# Patient Record
Sex: Female | Born: 1970
Health system: Southern US, Community
[De-identification: ages and names within clinical notes are randomized; demographics above are authoritative.]

## PROBLEM LIST (undated history)

## (undated) ENCOUNTER — Emergency Department (HOSPITAL_COMMUNITY): Admission: EM | Payer: Medicare Other | Source: Home / Self Care

## (undated) DIAGNOSIS — K219 Gastro-esophageal reflux disease without esophagitis: Secondary | ICD-10-CM

## (undated) DIAGNOSIS — K8020 Calculus of gallbladder without cholecystitis without obstruction: Secondary | ICD-10-CM

## (undated) DIAGNOSIS — T7840XA Allergy, unspecified, initial encounter: Secondary | ICD-10-CM

## (undated) DIAGNOSIS — K649 Unspecified hemorrhoids: Secondary | ICD-10-CM

## (undated) DIAGNOSIS — C801 Malignant (primary) neoplasm, unspecified: Secondary | ICD-10-CM

## (undated) DIAGNOSIS — J302 Other seasonal allergic rhinitis: Secondary | ICD-10-CM

## (undated) DIAGNOSIS — D649 Anemia, unspecified: Secondary | ICD-10-CM

## (undated) DIAGNOSIS — E119 Type 2 diabetes mellitus without complications: Secondary | ICD-10-CM

## (undated) DIAGNOSIS — I639 Cerebral infarction, unspecified: Secondary | ICD-10-CM

## (undated) HISTORY — DX: Gastro-esophageal reflux disease without esophagitis: K21.9

## (undated) HISTORY — PX: BREAST BIOPSY: SHX20

## (undated) HISTORY — DX: Unspecified hemorrhoids: K64.9

## (undated) HISTORY — PX: OVARY SURGERY: SHX727

## (undated) HISTORY — PX: MOUTH SURGERY: SHX715

## (undated) HISTORY — DX: Allergy, unspecified, initial encounter: T78.40XA

## (undated) HISTORY — DX: Calculus of gallbladder without cholecystitis without obstruction: K80.20

---

## 2009-09-14 ENCOUNTER — Emergency Department (HOSPITAL_COMMUNITY): Admission: EM | Admit: 2009-09-14 | Discharge: 2009-09-15 | Payer: Self-pay | Admitting: Emergency Medicine

## 2009-11-05 ENCOUNTER — Ambulatory Visit: Payer: Self-pay | Admitting: Family Medicine

## 2009-11-05 ENCOUNTER — Encounter (INDEPENDENT_AMBULATORY_CARE_PROVIDER_SITE_OTHER): Payer: Self-pay | Admitting: Family Medicine

## 2009-11-05 LAB — CONVERTED CEMR LAB
AST: 21 units/L (ref 0–37)
Albumin: 4.7 g/dL (ref 3.5–5.2)
BUN: 21 mg/dL (ref 6–23)
Cholesterol: 162 mg/dL (ref 0–200)
Eosinophils Absolute: 0.1 10*3/uL (ref 0.0–0.7)
Eosinophils Relative: 1 % (ref 0–5)
HDL: 44 mg/dL (ref >39)
Lymphocytes Relative: 30 % (ref 12–46)
Lymphs Abs: 1.6 10*3/uL (ref 0.7–4.0)
Monocytes Absolute: 0.4 10*3/uL (ref 0.1–1.0)
Monocytes Relative: 7 % (ref 3–12)
Platelets: 136 10*3/uL — ABNORMAL LOW (ref 150–400)
RBC: 4.27 M/uL (ref 3.87–5.11)
TSH: 1.076 microintl units/mL (ref 0.350–4.500)
Total CHOL/HDL Ratio: 3.7
Total Protein: 7.7 g/dL (ref 6.0–8.3)
Triglycerides: 89 mg/dL (ref <150)
VLDL: 18 mg/dL (ref 0–40)
Vit D, 25-Hydroxy: 23 ng/mL — ABNORMAL LOW (ref 30–89)
WBC: 5.4 10*3/uL (ref 4.0–10.5)

## 2009-11-28 ENCOUNTER — Ambulatory Visit (HOSPITAL_COMMUNITY): Admission: RE | Admit: 2009-11-28 | Discharge: 2009-11-28 | Payer: Self-pay | Admitting: Family Medicine

## 2009-12-12 ENCOUNTER — Encounter: Admission: RE | Admit: 2009-12-12 | Discharge: 2009-12-12 | Payer: Self-pay | Admitting: Family Medicine

## 2009-12-25 ENCOUNTER — Other Ambulatory Visit: Admission: RE | Admit: 2009-12-25 | Discharge: 2009-12-25 | Payer: Self-pay | Admitting: Family Medicine

## 2009-12-25 ENCOUNTER — Encounter (INDEPENDENT_AMBULATORY_CARE_PROVIDER_SITE_OTHER): Payer: Self-pay | Admitting: Family Medicine

## 2009-12-25 ENCOUNTER — Ambulatory Visit: Payer: Self-pay | Admitting: Internal Medicine

## 2009-12-25 LAB — CONVERTED CEMR LAB: Chlamydia, DNA Probe: NEGATIVE

## 2009-12-29 ENCOUNTER — Ambulatory Visit (HOSPITAL_COMMUNITY): Admission: RE | Admit: 2009-12-29 | Discharge: 2009-12-29 | Payer: Self-pay | Admitting: Family Medicine

## 2010-02-10 ENCOUNTER — Ambulatory Visit: Payer: Self-pay | Admitting: Internal Medicine

## 2010-02-10 ENCOUNTER — Encounter (INDEPENDENT_AMBULATORY_CARE_PROVIDER_SITE_OTHER): Payer: Self-pay | Admitting: Family Medicine

## 2010-02-10 LAB — CONVERTED CEMR LAB
Basophils Absolute: 0 10*3/uL (ref 0.0–0.1)
Eosinophils Absolute: 0.1 10*3/uL (ref 0.0–0.7)
Eosinophils Relative: 1 % (ref 0–5)
Hemoglobin: 12.2 g/dL (ref 12.0–15.0)
Lymphocytes Relative: 28 % (ref 12–46)
MCHC: 31.6 g/dL (ref 30.0–36.0)
MCV: 84.3 fL (ref 78.0–100.0)
Monocytes Absolute: 0.4 10*3/uL (ref 0.1–1.0)
Platelets: 129 10*3/uL — ABNORMAL LOW (ref 150–400)

## 2010-05-18 ENCOUNTER — Ambulatory Visit: Payer: Self-pay | Admitting: Internal Medicine

## 2010-09-28 LAB — URINE MICROSCOPIC-ADD ON

## 2010-09-28 LAB — COMPREHENSIVE METABOLIC PANEL
ALT: 29 U/L (ref 0–35)
Albumin: 4.1 g/dL (ref 3.5–5.2)
Alkaline Phosphatase: 81 U/L (ref 39–117)
BUN: 23 mg/dL (ref 6–23)
CO2: 26 mEq/L (ref 19–32)
Creatinine, Ser: 0.66 mg/dL (ref 0.4–1.2)
GFR calc Af Amer: >60 mL/min (ref >60)
GFR calc non Af Amer: >60 mL/min (ref >60)
Glucose, Bld: 133 mg/dL — ABNORMAL HIGH (ref 70–99)
Potassium: 3.9 mEq/L (ref 3.5–5.1)
Total Protein: 7.6 g/dL (ref 6.0–8.3)

## 2010-09-28 LAB — DIFFERENTIAL
Eosinophils Absolute: 0 10*3/uL (ref 0.0–0.7)
Eosinophils Relative: 0 % (ref 0–5)
Neutrophils Relative %: 96 % — ABNORMAL HIGH (ref 43–77)

## 2010-09-28 LAB — URINALYSIS, ROUTINE W REFLEX MICROSCOPIC
Ketones, ur: 40 mg/dL — AB
Specific Gravity, Urine: 1.031 — ABNORMAL HIGH (ref 1.005–1.030)
Urobilinogen, UA: 0.2 mg/dL (ref 0.0–1.0)

## 2010-09-28 LAB — CBC
RBC: 4.58 MIL/uL (ref 3.87–5.11)
WBC: 10.8 10*3/uL — ABNORMAL HIGH (ref 4.0–10.5)

## 2010-10-29 ENCOUNTER — Emergency Department: Payer: Self-pay | Admitting: Emergency Medicine

## 2015-10-21 ENCOUNTER — Other Ambulatory Visit (HOSPITAL_COMMUNITY): Payer: Self-pay | Admitting: *Deleted

## 2015-10-21 ENCOUNTER — Encounter (HOSPITAL_COMMUNITY): Payer: Self-pay | Admitting: *Deleted

## 2015-10-21 DIAGNOSIS — R928 Other abnormal and inconclusive findings on diagnostic imaging of breast: Secondary | ICD-10-CM

## 2015-11-06 ENCOUNTER — Ambulatory Visit
Admission: RE | Admit: 2015-11-06 | Discharge: 2015-11-06 | Disposition: A | Discharge status: Home / Self Care | Payer: No Typology Code available for payment source | Source: Ambulatory Visit | Attending: Obstetrics and Gynecology | Admitting: Obstetrics and Gynecology

## 2015-11-06 ENCOUNTER — Ambulatory Visit (HOSPITAL_COMMUNITY): Admission: RE | Admit: 2015-11-06 | Payer: Self-pay | Source: Ambulatory Visit

## 2015-11-06 DIAGNOSIS — R928 Other abnormal and inconclusive findings on diagnostic imaging of breast: Secondary | ICD-10-CM

## 2016-02-21 ENCOUNTER — Ambulatory Visit (HOSPITAL_COMMUNITY)
Admission: EM | Admit: 2016-02-21 | Discharge: 2016-02-21 | Disposition: A | Discharge status: Home / Self Care | Payer: No Typology Code available for payment source | Attending: Physician Assistant | Admitting: Physician Assistant

## 2016-02-21 ENCOUNTER — Encounter (HOSPITAL_COMMUNITY): Payer: Self-pay | Admitting: *Deleted

## 2016-02-21 DIAGNOSIS — R600 Localized edema: Secondary | ICD-10-CM

## 2016-02-21 LAB — POCT I-STAT, CHEM 8
BUN: 27 mg/dL — ABNORMAL HIGH (ref 6–20)
CREATININE: 0.6 mg/dL (ref 0.44–1.00)
Calcium, Ion: 1.23 mmol/L (ref 1.13–1.30)
Chloride: 105 mmol/L (ref 101–111)
GLUCOSE: 94 mg/dL (ref 65–99)
HEMATOCRIT: 38 % (ref 36.0–46.0)
HEMOGLOBIN: 12.9 g/dL (ref 12.0–15.0)
Potassium: 3.8 mmol/L (ref 3.5–5.1)
Sodium: 141 mmol/L (ref 135–145)
TCO2: 26 mmol/L (ref 0–100)

## 2016-02-21 NOTE — ED Provider Notes (Signed)
CSN: [id number]     Arrival date & time 02/21/16  1206 History   First MD Initiated Contact with Patient 02/21/16 1303     Chief Complaint  Patient presents with  . Leg Swelling   (Consider location/radiation/quality/duration/timing/severity/associated sxs/prior Treatment) Patient is a 45 yo caucasian female who presents with swelling in both lower extremities that began last night. She has no previous medical history that is noted. She sees Adult Health when has finances to do so.  She is homeless and has been sleeping with her legs propped in another chair. She does not know if this is contributory.  She otherwise has no chest pain, dyspnea or cough. No rashes or fevers.       History reviewed. No pertinent past medical history. History reviewed. No pertinent surgical history. History reviewed. No pertinent family history. Social History  Substance Use Topics  . Smoking status: Never Smoker  . Smokeless tobacco: Never Used  . Alcohol use No   OB History    No data available     Review of Systems  Constitutional: Negative for fever.  Respiratory: Negative for cough, chest tightness and shortness of breath.        No orthopnea   Cardiovascular: Positive for leg swelling. Negative for chest pain.  Gastrointestinal: Negative.   Endocrine: Negative for polydipsia and polyphagia.  Genitourinary: Negative for difficulty urinating.  Skin: Negative for color change, pallor, rash and wound.  Neurological: Negative for dizziness, light-headedness and headaches.  Psychiatric/Behavioral: Negative.     Allergies  Review of patient's allergies indicates no known allergies.  Home Medications   Prior to Admission medications   Not on File   Meds Ordered and Administered this Visit  Medications - No data to display  BP 132/76 (BP Location: Right Arm)   Pulse 78   Temp 97.6 F (36.4 C) (Oral)   LMP 02/13/2016  No data found.   Physical Exam  Constitutional: She is  oriented to person, place, and time. She appears well-developed and well-nourished. No distress.  HENT:  Head: Normocephalic and atraumatic.  Neck: Normal range of motion.  Negative JVD  Cardiovascular: Normal rate, regular rhythm, normal heart sounds and intact distal pulses.  Exam reveals no friction rub.   No murmur heard. Pulmonary/Chest: Effort normal and breath sounds normal. No respiratory distress.  Musculoskeletal: She exhibits no tenderness.  +1 Edema in the mid calves to feet  Neurological: She is alert and oriented to person, place, and time.  Skin: Skin is warm and dry. No rash noted. She is not diaphoretic. No erythema. No pallor.  Psychiatric: She has a normal mood and affect.  Nursing note and vitals reviewed.   Urgent Care Course   Clinical Course    Procedures (including critical care time)  Labs Review Labs Reviewed  POCT I-STAT, CHEM 8 - Abnormal; Notable for the following:       Result Value   BUN 27 (*)    All other components within normal limits    Imaging Review No results found.   Visual Acuity Review  Right Eye Distance:   Left Eye Distance:   Bilateral Distance:    Right Eye Near:   Left Eye Near:    Bilateral Near:         MDM   1. Bilateral edema of lower extremity    No emergent cause is noted today. Patient's vitals, exam and Istat are normal, except for edema. Suggest use of compression hose (she may  get OTC),  Elevation and sodium reduction. She is instructed to follow up with PCP for routine care and further evaluation. Certainly if she worsens or has additional emergent symptoms, then instructed to proceed to ED.     Bjorn Pippin, PA-C 02/21/16 1432

## 2016-02-21 NOTE — ED Triage Notes (Signed)
Pt    Reports   Symptoms    Of     Swelling    Both  Lower  Legs  That  She  Noticed  Last  Pm   denys  Any  Recent  Heavy   strenous   Activity     denys  Any  Chest  Pain  Or   Shortness  Of  Breath  Pt  Is  Sitting upright on the  Exam table   Speaking  In  Complete  sentances

## 2016-02-21 NOTE — Discharge Instructions (Signed)
You have lower extremity edema without signs and symptoms of an emergent issue. As we discussed try compression stockings and elevation. Reduce your salt intake. Your exam and labs look good here which is reassuring. If you begin having worsening symptoms then please go to the ER for further follow up. Please f/u with your PCP for full evaluation. Nice to meet you take care.

## 2016-04-02 ENCOUNTER — Telehealth: Payer: Self-pay | Admitting: *Deleted

## 2016-04-02 NOTE — Telephone Encounter (Signed)
"  This is [employer].  I missed a call from this extension.  Could you tell me what this is in reference to.  I've never been seen there."  Reports she "goes by her middle name [name]".  No call documentation.  New patient coordinator has no referral or records.  "Will you document this so whoever called knows I called back."

## 2016-04-12 ENCOUNTER — Other Ambulatory Visit (HOSPITAL_COMMUNITY): Payer: Self-pay | Admitting: *Deleted

## 2016-04-12 DIAGNOSIS — N6001 Solitary cyst of right breast: Secondary | ICD-10-CM

## 2016-04-13 ENCOUNTER — Other Ambulatory Visit (HOSPITAL_COMMUNITY): Payer: Self-pay | Admitting: *Deleted

## 2016-04-13 DIAGNOSIS — N6001 Solitary cyst of right breast: Secondary | ICD-10-CM

## 2016-04-28 ENCOUNTER — Encounter (HOSPITAL_COMMUNITY): Payer: Self-pay | Admitting: Emergency Medicine

## 2016-04-28 ENCOUNTER — Emergency Department (HOSPITAL_COMMUNITY)
Admission: EM | Admit: 2016-04-28 | Discharge: 2016-04-28 | Disposition: A | Discharge status: Home / Self Care | Payer: Self-pay | Attending: Emergency Medicine | Admitting: Emergency Medicine

## 2016-04-28 ENCOUNTER — Emergency Department (HOSPITAL_COMMUNITY): Payer: Self-pay

## 2016-04-28 DIAGNOSIS — Y999 Unspecified external cause status: Secondary | ICD-10-CM | POA: Insufficient documentation

## 2016-04-28 DIAGNOSIS — Y929 Unspecified place or not applicable: Secondary | ICD-10-CM | POA: Insufficient documentation

## 2016-04-28 DIAGNOSIS — S93402A Sprain of unspecified ligament of left ankle, initial encounter: Secondary | ICD-10-CM | POA: Insufficient documentation

## 2016-04-28 DIAGNOSIS — Y9301 Activity, walking, marching and hiking: Secondary | ICD-10-CM | POA: Insufficient documentation

## 2016-04-28 DIAGNOSIS — W109XXA Fall (on) (from) unspecified stairs and steps, initial encounter: Secondary | ICD-10-CM | POA: Insufficient documentation

## 2016-04-28 HISTORY — DX: Type 2 diabetes mellitus without complications: E11.9

## 2016-04-28 HISTORY — DX: Anemia, unspecified: D64.9

## 2016-04-28 HISTORY — DX: Other seasonal allergic rhinitis: J30.2

## 2016-04-28 NOTE — ED Notes (Signed)
Ortho tech called 

## 2016-04-28 NOTE — ED Triage Notes (Signed)
Pt comes from a class via EMS with complaints of a fall. States she lost her footing.  Reports left ankle injury with swelling present. No note of deformity.  Denies dizziness, LOC, or hitting her head.  States pain 7/10. HR 90, 97% RA, BP 150/100 in route.

## 2016-04-28 NOTE — ED Provider Notes (Signed)
[facility] DEPT Provider Note   CSN: [id number] Arrival date & time: 04/28/16  1556  By signing my name below, I, Rayna Sexton, attest that this documentation has been prepared under the direction and in the presence of [employer], PA-C. Electronically Signed: Rayna Sexton, ED Scribe. 04/28/16. 4:27 PM.   History   Chief Complaint Chief Complaint  Patient presents with  . Fall  . Ankle Pain    HPI HPI Comments: Kaitlyn Duncan is a 45 y.o. female who presents to the Emergency Department by ambulance complaining of a fall that occurred PTA. Pt was walking down a set of stairs and accidentally rolled her left ankle resulting in her fall. She reports associated left ankle pain and swelling which began immediately following the incident. She has not taken anything for her symptoms. Pt has not ambulated since the incident. Pt has a PCP in the area. No other associated symptoms at this time.    The history is provided by the patient. No language interpreter was used.    Past Medical History:  Diagnosis Date  . Anemia   . Anemia   . Diabetes mellitus without complication (HCC)    prediabetic  . Seasonal allergies    There are no active problems to display for this patient.  Past Surgical History:  Procedure Laterality Date  . MOUTH SURGERY    . OVARY SURGERY     removed "something"    OB History    No data available       Home Medications    Prior to Admission medications   Not on File    Family History No family history on file.  Social History Social History  Substance Use Topics  . Smoking status: Never Smoker  . Smokeless tobacco: Never Used  . Alcohol use No     Allergies   Review of patient's allergies indicates no known allergies.   Review of Systems Review of Systems  Musculoskeletal: Positive for arthralgias and joint swelling.  Skin: Negative for color change and wound.  Neurological: Negative for numbness.   Physical  Exam Updated Vital Signs BP (!) 152/102   Pulse 90   Temp 98.2 F (36.8 C) (Oral)   Resp 15   Ht 5\' 1"  (1.549 m)   Wt 72.6 kg   LMP 04/19/2016   SpO2 96%   BMI 30.23 kg/m   Physical Exam  Constitutional: She is oriented to person, place, and time. She appears well-developed and well-nourished.  HENT:  Head: Normocephalic and atraumatic.  Eyes: EOM are normal.  Neck: Normal range of motion.  Cardiovascular: Normal rate.   Pulses:      Dorsalis pedis pulses are 2+ on the left side.       Posterior tibial pulses are 2+ on the left side.  Pulmonary/Chest: Effort normal. No respiratory distress.  Abdominal: Soft.  Musculoskeletal: Normal range of motion. She exhibits edema.  Obvious swelling to the lateral malleolus of the left ankle. No pain with compression of the distal tib/fib or TTP to the proximal fibula.   Neurological: She is alert and oriented to person, place, and time. No sensory deficit.  Sensation intact.   Skin: Skin is warm and dry.  Psychiatric: She has a normal mood and affect.  Nursing note and vitals reviewed.  ED Treatments / Results  Labs (all labs ordered are listed, but only abnormal results are displayed) Labs Reviewed - No data to display  EKG  EKG Interpretation None  Radiology Dg Ankle Complete Left  Result Date: 04/28/2016 CLINICAL DATA:  Slipping injury of the left ankle, pain especially laterally, limited range of motion. EXAM: LEFT ANKLE COMPLETE - 3+ VIEW COMPARISON:  None. FINDINGS: Abnormal soft tissue swelling overlying the lateral malleolus without underlying fracture identified. Plafond and talar dome intact. The base of the fifth metatarsal unremarkable. IMPRESSION: 1. Abnormal soft tissue swelling overlying the lateral malleolus but without underlying bony abnormality identified. Electronically Signed   By: Van Clines M.D.   On: 04/28/2016 16:31   Procedures Procedures  DIAGNOSTIC STUDIES: Oxygen Saturation is 96%  on RA, normal by my interpretation.    COORDINATION OF CARE: 4:27 PM Discussed next steps with pt. Pt verbalized understanding and is agreeable with the plan.    Medications Ordered in ED Medications - No data to display   Initial Impression / Assessment and Plan / ED Course  I have reviewed the triage vital signs and the nursing notes.  Pertinent labs & imaging results that were available during my care of the patient were reviewed by me and considered in my medical decision making (see chart for details).  Clinical Course    Labs: none indicated  Imaging: DG left ankle   Consults: none  Therapeutics:   Assessment:  Plan: Patient presents with ankle sprain, no obvious deformity or significant laxity. Patient was placed in an ASO, given crutches, given care instructions and close follow-up with primary care or orthopedics. Pt given strict return precautions, verbalized understanding and agreement to today's plan and had no further questions or concerns at the time of discharge.    I personally performed the services described in this documentation, which was scribed in my presence. The recorded information has been reviewed and is accurate.  Final Clinical Impressions(s) / ED Diagnoses   Final diagnoses:  Sprain of left ankle, unspecified ligament, initial encounter    New Prescriptions New Prescriptions   No medications on file     Okey Regal, PA-C 04/28/16 1703    Virgel Manifold, MD 05/01/16 [phone]

## 2016-04-28 NOTE — Discharge Instructions (Signed)
Please read attached information. If you experience any new or worsening signs or symptoms please return to the emergency room for evaluation. Please follow-up with your primary care provider or specialist as discussed. Please use medication prescribed only as directed and discontinue taking if you have any concerning signs or symptoms.   °

## 2016-04-29 ENCOUNTER — Ambulatory Visit (HOSPITAL_COMMUNITY)
Admission: RE | Admit: 2016-04-29 | Discharge: 2016-04-29 | Disposition: A | Discharge status: Home / Self Care | Payer: Self-pay | Source: Ambulatory Visit | Attending: Obstetrics and Gynecology | Admitting: Obstetrics and Gynecology

## 2016-04-29 ENCOUNTER — Ambulatory Visit
Admission: RE | Admit: 2016-04-29 | Discharge: 2016-04-29 | Disposition: A | Discharge status: Home / Self Care | Payer: Self-pay | Source: Ambulatory Visit | Attending: Obstetrics and Gynecology | Admitting: Obstetrics and Gynecology

## 2016-04-29 ENCOUNTER — Encounter (HOSPITAL_COMMUNITY): Payer: Self-pay

## 2016-04-29 VITALS — BP 120/84 | Ht 61.0 in | Wt 160.0 lb

## 2016-04-29 DIAGNOSIS — N644 Mastodynia: Secondary | ICD-10-CM

## 2016-04-29 DIAGNOSIS — N6001 Solitary cyst of right breast: Secondary | ICD-10-CM

## 2016-04-29 DIAGNOSIS — Z1239 Encounter for other screening for malignant neoplasm of breast: Secondary | ICD-10-CM

## 2016-04-29 NOTE — Patient Instructions (Signed)
Explained self breast awareness to Merwyn Katos. Patient did not need a Pap smear today due to last Pap smear was in June 2017 per patient. Let her know [facility] will cover Pap smears every 3 years unless has a history of abnormal Pap smears. Referred patient to the [facility] for diagnostic mammogram and possible right breast ultrasound per recommendation. Appointment scheduled for Thursday, April 29, 2016 at 1130. Damion Burgoon verbalized understanding.  Pranavi Aure, Arvil Chaco, RN 12:43 PM

## 2016-04-29 NOTE — Progress Notes (Signed)
Patient referred to [facility] by the [facility] due to six month follow up was recommended for right breast. Right breast diagnostic mammogram completed 12/14/2015. Patient never followed up. Patient stated she had a mammogram completed in [place] around 4 years ago but unsure of the results and recommendations. Informed Mammography Tech of last mammogram. Patient stated she has already requested the results.   Pap Smear: Pap smear not completed today. Last Pap smear was in June 2017 at [facility] and normal per patient. Per patient has no history of an abnormal Pap smear. No Pap smear results are in EPIC.   Physical exam: Breasts Breasts symmetrical. No skin abnormalities bilateral breasts. No nipple retraction bilateral breasts. No nipple discharge bilateral breasts. No lymphadenopathy. No lumps palpated bilateral breasts. Complaints of right breast tenderness around 6 o'clock on exam. Referred patient to the [facility] for diagnostic mammogram and possible right breast ultrasound per recommendation. Appointment scheduled for Thursday, April 29, 2016 at 1130.        Pelvic/Bimanual No Pap smear completed today since last Pap smear was in June 2017  per patient. Pap smear not indicated per [facility] guidelines.   Smoking History: Patient has never smoked.  Patient Navigation: Patient education provided. Access to services provided for patient through [facility] program.

## 2016-05-03 ENCOUNTER — Encounter (HOSPITAL_COMMUNITY): Payer: Self-pay | Admitting: *Deleted

## 2016-05-04 ENCOUNTER — Encounter: Payer: Self-pay | Admitting: Pediatric Intensive Care

## 2016-05-12 ENCOUNTER — Encounter: Payer: Self-pay | Admitting: Pediatric Intensive Care

## 2016-05-12 NOTE — Congregational Nurse Program (Signed)
Congregational Nurse Program Note  Date of Encounter: 05/04/2016  Past Medical History: Past Medical History:  Diagnosis Date  . Anemia   . Anemia   . Diabetes mellitus without complication (HCC)    prediabetic  . Seasonal allergies     Encounter Details:     CNP Questionnaire - 05/04/16 0930      Patient Demographics   Is this a new or existing patient? New   Patient is considered a/an Not Applicable   Race Caucasian/White     Patient Assistance   Location of Patient Assistance [facility]   Patient's financial/insurance status Self-Pay (Uninsured)   Uninsured Patient ([place] [employer]/Care Connects) Yes   Interventions Follow-up/Education/Support provided after completed appt.   Patient referred to apply for the following financial assistance [place] insecurities addressed Not Applicable   Transportation assistance No   Assistance securing medications No   Doctor, hospital the healthcare system     Encounter Details   Primary purpose of visit Acute Illness/Condition Visit;Post ED/Hospitalization Visit   Was an Emergency Department visit averted? Not Applicable   Patient referred to Not Applicable   Was a mental health screening completed? (GAINS tool) No   Does patient have dental issues? No   Does patient have vision issues? No   Does your patient have an abnormal blood pressure today? No   Since previous encounter, have you referred patient for abnormal blood pressure that resulted in a new diagnosis or medication change? No   Does your patient have an abnormal blood glucose today? No   Since previous encounter, have you referred patient for abnormal blood glucose that resulted in a new diagnosis or medication change? No   Was there a life-saving intervention made? No     New client- states that she fell last week injuring her left foot and ankle. Was evalauted in the emergency department. Client states that she was directed to  follow up with orthpedics but she does not have insurance or means to pay for appointment. CN recommended continued elevation for swelling and pain relief. CN will assist client with ortho follow up.

## 2016-08-02 ENCOUNTER — Emergency Department (HOSPITAL_COMMUNITY)
Admission: EM | Admit: 2016-08-02 | Discharge: 2016-08-02 | Disposition: A | Discharge status: Home / Self Care | Payer: No Typology Code available for payment source | Attending: Emergency Medicine | Admitting: Emergency Medicine

## 2016-08-02 ENCOUNTER — Encounter (HOSPITAL_COMMUNITY): Payer: Self-pay | Admitting: Emergency Medicine

## 2016-08-02 DIAGNOSIS — S93402D Sprain of unspecified ligament of left ankle, subsequent encounter: Secondary | ICD-10-CM

## 2016-08-02 DIAGNOSIS — E119 Type 2 diabetes mellitus without complications: Secondary | ICD-10-CM | POA: Insufficient documentation

## 2016-08-02 DIAGNOSIS — M25572 Pain in left ankle and joints of left foot: Secondary | ICD-10-CM

## 2016-08-02 DIAGNOSIS — W19XXXD Unspecified fall, subsequent encounter: Secondary | ICD-10-CM | POA: Insufficient documentation

## 2016-08-02 NOTE — ED Triage Notes (Signed)
Pt arrives via PO[facility] from home with left ankle pain that began in October from fall. Pt reports was told ankle was sprained and has been unable to followup with Ortho because she doesn't have insurance. Pt ambulatory with ortho support device in place. Distal circulation intact. VSS. Pt ambulatory.

## 2016-08-02 NOTE — Discharge Instructions (Signed)
Continue to wear the ankle brace as needed for comfort. Alternate between ice and heat, and elevate ankle throughout the day, using ice pack for no more than 20 minutes every hour.  Alternate between tylenol and motrin for pain relief. Call orthopedic follow up today or tomorrow to schedule followup appointment for recheck of ongoing ankle pain in 1-2 weeks that can be canceled with a 24-48 hour notice if complete resolution of pain. Follow up with [facility] center in 1-2 weeks to establish medical care and to help facilitate getting referred to orthopedist. Return to the ER for changes or worsening symptoms.

## 2016-08-02 NOTE — ED Provider Notes (Signed)
Kaitlyn Duncan Provider Note   CSN: [id number] Arrival date & time: 08/02/16  1217  By signing my name below, I, Kaitlyn Duncan, attest that this documentation has been prepared under the direction and in the presence of [address] [employer] . Electronically Signed: Higinio Duncan, Scribe. 08/02/2016. 2:59 PM.  History   Chief Complaint Chief Complaint  Patient presents with  . Foot Pain   The history is provided by the patient and medical records. No language interpreter was used.  Foot Pain  This is a new problem. The current episode started more than 1 week ago. The problem occurs daily. The problem has been gradually worsening. The symptoms are aggravated by exertion, standing and walking. The symptoms are relieved by acetaminophen. She has tried a warm compress and acetaminophen for the symptoms. The treatment provided moderate relief.   HPI Comments: Kaitlyn Duncan is a 46 y.o. female with PMHx of anemia and DM2, who presents to the Emergency Department complaining of gradually worsening left foot and ankle pain s/p a fall that occurred on 04/28/16. Pt was seen at [facility] ED on 04/28/16 for similar ankle pain after her fall in which she received imaging that revealed swelling around the lateral malleolus but no bony abnormalities present. Pt reports that since then she "randomly" experiences intermittent, 7/10, "sharp" nonradiating pain in various locations of her L foot and ankle; however, she is not experiencing any pain now in the ED. She states associated swelling and bruising to her left ankle that is exacerbated when standing for long periods of time and at night. She notes she has taken ibuprofen and soaked her ankle in epsom salt with moderate relief of swelling and symptoms.  She states she called the orthopedic specialist she was referred to at this appointment but was told "it was just a suggestion" and notes she could not afford an appointment without insurance. Pt reports she also  contact [facility] but was told they were not accepting new patients at this time. She denies any numbness, tingling, or focal weakness, or any other complaints at this time.   Past Medical History:  Diagnosis Date  . Anemia   . Anemia   . Diabetes mellitus without complication (HCC)    prediabetic  . Seasonal allergies    There are no active problems to display for this patient.  Past Surgical History:  Procedure Laterality Date  . MOUTH SURGERY    . OVARY SURGERY     removed "something"    OB History    Gravida Para Term Preterm AB Living   0 0 0 0 0 0   SAB TAB Ectopic Multiple Live Births   0 0 0 0 0     Home Medications    Prior to Admission medications   Not on File    Family History Family History  Problem Relation Age of Onset  . Hypertension Father   . Breast cancer Maternal Aunt   . Diabetes Maternal Aunt   . Breast cancer Paternal Aunt   . Breast cancer Maternal Grandmother   . Breast cancer Paternal Grandmother   . Diabetes Paternal Grandmother     Social History Social History  Substance Use Topics  . Smoking status: Never Smoker  . Smokeless tobacco: Never Used  . Alcohol use No    Allergies   Patient has no known allergies.  Review of Systems Review of Systems  Musculoskeletal: Positive for arthralgias and joint swelling. Negative for myalgias.  Skin:  Positive for color change.  Allergic/Immunologic: Positive for immunocompromised state (DM2).  Neurological: Negative for weakness and numbness.  Psychiatric/Behavioral: Negative for confusion.  10 Systems reviewed and are negative for acute change except as noted in the HPI.   Physical Exam Updated Vital Signs BP 140/98 (BP Location: Right Arm)   Pulse 89   Temp 98.6 F (37 C) (Oral)   Resp 18   SpO2 98%   Physical Exam  Constitutional: She is oriented to person, place, and time. Vital signs are normal. She appears well-developed and well-nourished.  Non-toxic  appearance. No distress.  Afebrile, nontoxic, NAD  HENT:  Head: Normocephalic and atraumatic.  Mouth/Throat: Mucous membranes are normal.  Eyes: Conjunctivae and EOM are normal. Right eye exhibits no discharge. Left eye exhibits no discharge.  Neck: Normal range of motion. Neck supple.  Cardiovascular: Normal rate and intact distal pulses.   Pulmonary/Chest: Effort normal. No respiratory distress.  Abdominal: Normal appearance. She exhibits no distension.  Musculoskeletal: Normal range of motion.       Left ankle: She exhibits swelling. She exhibits normal range of motion, no ecchymosis, no deformity, no laceration and normal pulse. Tenderness. Lateral malleolus tenderness found. Achilles tendon normal.  Left ankle with FROM intact, with mild swelling, no crepitus or deformity, with mild TTP of the lateral malleolus, but no TTP or swelling of fore foot or calf. No break in skin. No bruising or erythema. No warmth. Achilles intact. Good pedal pulse and cap refill of all toes. Wiggling toes without difficulty. Sensation grossly intact. Soft compartments  Neurological: She is alert and oriented to person, place, and time. She has normal strength. No sensory deficit.  Skin: Skin is warm, dry and intact. No rash noted.  Psychiatric: She has a normal mood and affect. Her behavior is normal.  Nursing note and vitals reviewed.  ED Treatments / Results  Labs (all labs ordered are listed, but only abnormal results are displayed) Labs Reviewed - No data to display  EKG  EKG Interpretation None       Radiology No results found.  Study Result from Left Ankle X-ray on 04/28/16 CLINICAL DATA:  Slipping injury of the left ankle, pain especially laterally, limited range of motion.  EXAM: LEFT ANKLE COMPLETE - 3+ VIEW  COMPARISON:  None.  FINDINGS: Abnormal soft tissue swelling overlying the lateral malleolus without underlying fracture identified. Plafond and talar dome intact. The base  of the fifth metatarsal unremarkable.  IMPRESSION: 1. Abnormal soft tissue swelling overlying the lateral malleolus but without underlying bony abnormality identified.   Procedures Procedures (including critical care time)  Medications Ordered in ED Medications - No data to display  DIAGNOSTIC STUDIES:  Oxygen Saturation is 98% on RA, normal by my interpretation.    COORDINATION OF CARE:  2:50 PM Discussed treatment Duncan with pt at bedside and pt agreed to Duncan.  Initial Impression / Assessment and Duncan / ED Course  I have reviewed the triage vital signs and the nursing notes.  Pertinent labs & imaging results that were available during my care of the patient were reviewed by me and considered in my medical decision making (see chart for details).     46 y.o. female here with ongoing L ankle pain after injury in Oct 2017. Xrays at that time neg. Ongoing intermittent pain and swelling, no bruising or erythema/warmth. On exam, NVI with soft compartments, mild tenderness and swelling to lateral malleolus, FROM intact. Likely sprain, given that it hasn't improved, could be  more severe sprain/tear than can be managed with conservative therapies alone, so encouraged pt to f/up with ortho, may need outpatient MRI. Doubt need for other emergent work up at this time. RICE, tylenol/motrin, ice/heat encouraged. F/up with [facility] in 1wk to establish care and for help with referrals through [facility]; f/up with ortho in 1-2wks. I explained the diagnosis and have given explicit precautions to return to the ER including for any other new or worsening symptoms. The patient understands and accepts the medical Duncan as it's been dictated and I have answered their questions. Discharge instructions concerning home care and prescriptions have been given. The patient is STABLE and is discharged to home in good condition.   I personally performed the services described in this documentation, which was scribed  in my presence. The recorded information has been reviewed and is accurate.   Final Clinical Impressions(s) / ED Diagnoses   Final diagnoses:  Sprain of left ankle, unspecified ligament, subsequent encounter  Left ankle pain, unspecified chronicity    New Prescriptions New Prescriptions   No medications on file     [address] PA-C 08/02/16 [place], MD 08/11/16 [phone]

## 2016-08-30 ENCOUNTER — Encounter: Payer: Self-pay | Admitting: Pediatric Intensive Care

## 2016-09-09 NOTE — Congregational Nurse Program (Signed)
Congregational Nurse Program Note  Date of Encounter: 08/30/2016  Past Medical History: Past Medical History:  Diagnosis Date  . Anemia   . Anemia   . Diabetes mellitus without complication (HCC)    prediabetic  . Seasonal allergies     Encounter Details:     CNP Questionnaire - 08/30/16 1045      Patient Demographics   Is this a new or existing patient? New   Patient is considered a/an Not Applicable   Race Caucasian/White     Patient Assistance   Location of Patient Assistance [place]   Patient's financial/insurance status Self-Pay (Uninsured);Low Income   Uninsured Patient ([facility]) Yes   Interventions Not Applicable   Patient referred to apply for the following financial assistance [place] insecurities addressed Not Applicable   Transportation assistance Yes   Type of Assistance Bus Pass Given   Assistance securing medications No   Educational health offerings Navigating the healthcare system     Encounter Details   Primary purpose of visit Education/Health Concerns   Was an Emergency Department visit averted? Not Applicable   Does patient have a medical provider? Yes   Patient referred to Follow up with established PCP   Was a mental health screening completed? (GAINS tool) No   Does patient have dental issues? No   Does patient have vision issues? No   Does your patient have an abnormal blood pressure today? No   Since previous encounter, have you referred patient for abnormal blood pressure that resulted in a new diagnosis or medication change? No   Does your patient have an abnormal blood glucose today? No   Since previous encounter, have you referred patient for abnormal blood glucose that resulted in a new diagnosis or medication change? No   Was there a life-saving intervention made? No    Client is continuing to go to [facility] clinic for PCP. She has filled out [employer] and needs bus passes to tak to  [place].

## 2016-09-22 ENCOUNTER — Ambulatory Visit: Payer: Self-pay | Attending: Orthopedic Surgery | Admitting: Physical Therapy

## 2016-09-22 ENCOUNTER — Encounter: Payer: Self-pay | Admitting: Physical Therapy

## 2016-09-22 DIAGNOSIS — M25672 Stiffness of left ankle, not elsewhere classified: Secondary | ICD-10-CM | POA: Insufficient documentation

## 2016-09-22 DIAGNOSIS — R262 Difficulty in walking, not elsewhere classified: Secondary | ICD-10-CM | POA: Insufficient documentation

## 2016-09-22 DIAGNOSIS — M25572 Pain in left ankle and joints of left foot: Secondary | ICD-10-CM | POA: Insufficient documentation

## 2016-09-22 NOTE — Therapy (Signed)
[place], [place], [address] Phone: [phone]   Fax:  [phone]  Physical Therapy Evaluation  Patient Details  Name: Kaitlyn Duncan MRN: [id number] Date of Birth: 1971/05/04 Referring Provider: Altamese [place], MD  Encounter Date: 09/22/2016      PT End of Session - 09/22/16 1333    Visit Number 1   Number of Visits 9   Date for PT Re-Evaluation 10/22/16   Authorization Type In process of filling out financial aid form   PT Start Time 1333   PT Stop Time 1432   PT Time Calculation (min) 59 min   Activity Tolerance Patient tolerated treatment well   Behavior During Therapy [facility] for tasks assessed/performed      Past Medical History:  Diagnosis Date  . Anemia   . Anemia   . Diabetes mellitus without complication (HCC)    prediabetic  . Seasonal allergies     Past Surgical History:  Procedure Laterality Date  . MOUTH SURGERY    . OVARY SURGERY     removed "something"    There were no vitals filed for this visit.       Subjective Assessment - 09/22/16 1338    Subjective Pt reports falling after missing a step, foot came out from under her and twisted. Noted immediate swelling and pain. still having pain, popping and burning sensation. Pt reports not using crutches over the last week. Pt reports she has been elevating but not icing. Has been wearing slip on sandal over brace.    How long can you stand comfortably? 10 min   Patient Stated Goals walk normally (overusing R), stand for longer periods, steps/stairs, walk/run a 5k   Currently in Pain? Yes   Pain Score 4    Pain Location Ankle   Pain Orientation Left;Lateral   Pain Descriptors / Indicators Aching   Pain Frequency Intermittent   Aggravating Factors  walking, standing   Pain Relieving Factors medications            [facility] PT Assessment - 09/22/16 0001      Assessment   Medical Diagnosis L peroneal tubercle fx,  peroneal tendonitis   Referring Provider Altamese [place], MD   Onset Date/Surgical Date 04/28/17   Hand Dominance Right   Next MD Visit not scheduled   Prior Therapy no     Precautions   Precautions None     Restrictions   Weight Bearing Restrictions No     Balance Screen   Has the patient fallen in the past 6 months Yes   How many times? 1   Has the patient had a decrease in activity level because of a fear of falling?  No   Is the patient reluctant to leave their home because of a fear of falling?  No     Home Ecologist residence   Living Arrangements Other (Comment)  homeless shelter   Additional Comments stairs daily      Prior Function   Vocation Requirements sit at computer     Cognition   Overall Cognitive Status Within Functional Limits for tasks assessed     Observation/Other Assessments   Focus on Therapeutic Outcomes (FOTO)  45% ability (goal 61%)     Sensation   Additional Comments WFL     ROM / Strength   AROM / PROM / Strength AROM;PROM;Strength     AROM   AROM Assessment Site Ankle  Right/Left Ankle Left   Left Ankle Dorsiflexion 0   Left Ankle Plantar Flexion 40   Left Ankle Inversion 40   Left Ankle Eversion 20     PROM   PROM Assessment Site Ankle   Right/Left Ankle Left   Left Ankle Dorsiflexion 4     Strength   Strength Assessment Site Hip;Knee;Ankle   Right/Left Hip Left   Left Hip Flexion 4/5   Left Hip Extension 3+/5   Left Hip ABduction 4-/5   Right/Left Knee Left   Left Knee Flexion 5/5   Left Knee Extension 5/5   Right/Left Ankle Left   Left Ankle Dorsiflexion 4+/5   Left Ankle Inversion 5/5   Left Ankle Eversion 4-/5     Palpation   Palpation comment TTP post lateral malleolus     Ambulation/Gait   Gait Comments antalgic on L, wearing figure 8 ASO                   [facility] Adult PT Treatment/Exercise - 09/22/16 0001      Exercises   Exercises Knee/Hip;Ankle     Ankle  Exercises: Stretches   Other Stretch long sitting DF & hamstring stretch     Ankle Exercises: Seated   Other Seated Ankle Exercises long sitting eversion, inversion                PT Education - 09/22/16 1423    Education provided Yes   Education Details anatomy of condition, POC, HEP, exercise form/rationale   Person(s) Educated Patient   Methods Explanation;Demonstration;Tactile cues;Verbal cues;Handout   Comprehension Verbalized understanding;Returned demonstration;Verbal cues required;Tactile cues required;Need further instruction          PT Short Term Goals - 09/22/16 1428      PT SHORT TERM GOAL #1   Title FOTO to 61% ability to indicate significant improvement in functional activity   Baseline 45% ability at eval   Time 4   Period Weeks   Status New     PT SHORT TERM GOAL #2   Title pt will be able to climb stairs at residence without increase in ankle pain   Baseline discomfort at eval   Time 4   Period Weeks   Status New     PT SHORT TERM GOAL #3   Title Pt will verbalize ability to begin walking program to train for 5K   Baseline will educate as treatment progresses   Time 4   Period Weeks   Status New     PT SHORT TERM GOAL #4   Title DF bilaterally to 8 deg to improve functional ankle mobility   Baseline see flowsheet   Time 4   Period Weeks   Status New     PT SHORT TERM GOAL #5   Title hip strength to 4+/5 to provide proximal stability to LE   Baseline see flowsheet   Time 4   Period Weeks   Status New                  Plan - 09/22/16 1424    Clinical Impression Statement Pt presents to PT today with complaints of L ankle pain and popping following a fall down the stairs. Pt with good strength around ankle but limited ROM and weakness in proximal hip musculature. Pt is no longer on crutches but is wearing ASO; I told her she does not need to wear ASO full time, only with more strenuous walking and other activities since  she  has good strength. Minimal edema noted in L vs R. Pt will benefit from skilled PT in order to improve functional mobility and decrease  ankle pain.    Rehab Potential Good   PT Frequency 2x / week   PT Duration 4 weeks   PT Treatment/Interventions ADLs/Self Care Home Management;Cryotherapy;Electrical Stimulation;Iontophoresis 4mg /ml Dexamethasone;Functional mobility training;Stair training;Gait training;Traction;Moist Heat;Therapeutic activities;Ultrasound;Therapeutic exercise;Balance training;Neuromuscular re-education;Patient/family education;Passive range of motion;Manual techniques;Dry needling;Taping   PT Next Visit Plan DF stretch, CKC strength hips, SLS, steps   PT Home Exercise Plan long sitting DF stretch, red tband resisted eversion & inversion, bridge with clam   Consulted and Agree with Plan of Care Patient      Patient will benefit from skilled therapeutic intervention in order to improve the following deficits and impairments:  Abnormal gait, Decreased range of motion, Difficulty walking, Increased muscle spasms, Pain, Decreased activity tolerance, Impaired flexibility, Decreased strength  Visit Diagnosis: Pain in left ankle and joints of left foot - Plan: PT plan of care cert/re-cert  Stiffness of left ankle, not elsewhere classified - Plan: PT plan of care cert/re-cert  Difficulty in walking, not elsewhere classified - Plan: PT plan of care cert/re-cert     Problem List There are no active problems to display for this patient.  Jaleya Pebley C. Ellar Hakala PT, DPT 09/22/16 2:35 PM   [facility] [address] [place], [place], [address] Phone: [phone]   Fax:  [phone]  Name: Niya Behler MRN: [id number] Date of Birth: 1971/01/17

## 2016-09-27 ENCOUNTER — Ambulatory Visit: Payer: Self-pay | Admitting: Physical Therapy

## 2016-09-27 DIAGNOSIS — M25672 Stiffness of left ankle, not elsewhere classified: Secondary | ICD-10-CM

## 2016-09-27 DIAGNOSIS — M25572 Pain in left ankle and joints of left foot: Secondary | ICD-10-CM

## 2016-09-27 DIAGNOSIS — R262 Difficulty in walking, not elsewhere classified: Secondary | ICD-10-CM

## 2016-09-27 NOTE — Therapy (Signed)
[place], [place], [address] Phone: [phone]   Fax:  [phone]  Physical Therapy Treatment  Patient Details  Name: Kaitlyn Duncan MRN: [id number] Date of Birth: 02-28-1971 Referring Provider: Altamese , MD  Encounter Date: 09/27/2016      PT End of Session - 09/27/16 1258    Visit Number 2   Number of Visits 9   Date for PT Re-Evaluation 10/22/16   Authorization Type In process of filling out financial aid form   PT Start Time 1245   PT Stop Time 1334   PT Time Calculation (min) 49 min      Past Medical History:  Diagnosis Date  . Anemia   . Anemia   . Diabetes mellitus without complication (HCC)    prediabetic  . Seasonal allergies     Past Surgical History:  Procedure Laterality Date  . MOUTH SURGERY    . OVARY SURGERY     removed "something"    There were no vitals filed for this visit.      Subjective Assessment - 09/27/16 1245    Subjective I have not been able to do  the exercises due to my living situation. Stairs are better. I got a compression sock.    Currently in Pain? No/denies                         [facility] Adult PT Treatment/Exercise - 09/27/16 0001      Ambulation/Gait   Ambulation/Gait Yes   Stairs Yes   Stairs Assistance 6: Modified independent (Device/Increase time)   Number of Stairs 12   Height of Stairs 6     Knee/Hip Exercises: Aerobic   Recumbent Bike L1 x 5 minutes      Knee/Hip Exercises: Standing   Lateral Step Up 10 reps;Hand Hold: 1;Step Height: 4"   Forward Step Up 15 reps;Hand Hold: 1;Step Height: 6"   SLS SLS trials and tandem trials, increased anterior ankle pain      Knee/Hip Exercises: Supine   Bridges with Clamshell 10 reps  blue   Straight Leg Raises Both;10 reps     Knee/Hip Exercises: Sidelying   Hip ABduction Both;10 reps     Knee/Hip Exercises: Prone   Straight Leg Raises Both;10 reps     Modalities   Modalities Cryotherapy     Cryotherapy   Number Minutes Cryotherapy 10 Minutes   Cryotherapy Location Ankle   Type of Cryotherapy Ice pack     Ankle Exercises: Stretches   Other Stretch long sitting DF & hamstring stretch     Ankle Exercises: Seated   Other Seated Ankle Exercises long sitting eversion, inversion  red band x 20 each , also DF x 20 red band                   PT Short Term Goals - 09/22/16 1428      PT SHORT TERM GOAL #1   Title FOTO to 61% ability to indicate significant improvement in functional activity   Baseline 45% ability at eval   Time 4   Period Weeks   Status New     PT SHORT TERM GOAL #2   Title pt will be able to climb stairs at residence without increase in ankle pain   Baseline discomfort at eval   Time 4   Period Weeks   Status New     PT SHORT TERM GOAL #  3   Title Pt will verbalize ability to begin walking program to train for 5K   Baseline will educate as treatment progresses   Time 4   Period Weeks   Status New     PT SHORT TERM GOAL #4   Title DF bilaterally to 8 deg to improve functional ankle mobility   Baseline see flowsheet   Time 4   Period Weeks   Status New     PT SHORT TERM GOAL #5   Title hip strength to 4+/5 to provide proximal stability to LE   Baseline see flowsheet   Time 4   Period Weeks   Status New                  Plan - 09/27/16 1309    Clinical Impression Statement Pt has not performed any HEP. She has been attempting steps with correct technique and notes improved mobility. Reviewed HEP today and continued hip and ankle strength. Began recumbent bike with good tolerance. Discussed other positions to perform her HEP as she reports she is unable to complete HEP due to her living situation.    PT Next Visit Plan DF stretch, CKC strength hips, SLS, steps   PT Home Exercise Plan long sitting DF stretch, red tband resisted eversion & inversion, bridge with clam   Consulted and  Agree with Plan of Care Patient      Patient will benefit from skilled therapeutic intervention in order to improve the following deficits and impairments:  Abnormal gait, Decreased range of motion, Difficulty walking, Increased muscle spasms, Pain, Decreased activity tolerance, Impaired flexibility, Decreased strength  Visit Diagnosis: Pain in left ankle and joints of left foot  Stiffness of left ankle, not elsewhere classified  Difficulty in walking, not elsewhere classified     Problem List There are no active problems to display for this patient.   Dorene Ar, [place] 09/27/2016, 1:37 PM  [place], [place], [address] Phone: [phone]   Fax:  [phone]  Name: Kaitlyn Duncan MRN: [id number] Date of Birth: [birth year]

## 2016-09-30 ENCOUNTER — Ambulatory Visit: Payer: Self-pay | Admitting: Physical Therapy

## 2016-09-30 ENCOUNTER — Encounter: Payer: Self-pay | Admitting: Physical Therapy

## 2016-09-30 DIAGNOSIS — R262 Difficulty in walking, not elsewhere classified: Secondary | ICD-10-CM

## 2016-09-30 DIAGNOSIS — M25572 Pain in left ankle and joints of left foot: Secondary | ICD-10-CM

## 2016-09-30 DIAGNOSIS — M25672 Stiffness of left ankle, not elsewhere classified: Secondary | ICD-10-CM

## 2016-09-30 NOTE — Therapy (Signed)
[place], [place], [address] Phone: [phone]   Fax:  [phone]  Physical Therapy Treatment  Patient Details  Name: Kaitlyn Duncan MRN: [id number] Date of Birth: 1971/03/27 Referring Provider: Altamese Ballantine, MD  Encounter Date: 09/30/2016      PT End of Session - 09/30/16 1329    Visit Number 3   Number of Visits 9   Date for PT Re-Evaluation 10/22/16   Authorization Type In process of filling out financial aid form   PT Start Time 1330   PT Stop Time 1424   PT Time Calculation (min) 54 min   Activity Tolerance Patient tolerated treatment well   Behavior During Therapy [facility] for tasks assessed/performed      Past Medical History:  Diagnosis Date  . Anemia   . Anemia   . Diabetes mellitus without complication (HCC)    prediabetic  . Seasonal allergies     Past Surgical History:  Procedure Laterality Date  . MOUTH SURGERY    . OVARY SURGERY     removed "something"    There were no vitals filed for this visit.      Subjective Assessment - 09/30/16 1330    Subjective pt reports having some pain either last night or this morning, sharp sensation over ankle that was constant for a few minutes. Pt reports she is not doing her exercises due to getting in too late.    Patient Stated Goals walk normally (overusing R), stand for longer periods, steps/stairs, walk/run a 5k   Currently in Pain? No/denies                         [facility] Adult PT Treatment/Exercise - 09/30/16 0001      Knee/Hip Exercises: Stretches   Gastroc Stretch 30 seconds   Gastroc Stretch Limitations slant board   Soleus Stretch 30 seconds   Soleus Stretch Limitations slant board     Knee/Hip Exercises: Aerobic   Nustep L4 5 min     Knee/Hip Exercises: Standing   SLS SLS without UE support     Knee/Hip Exercises: Seated   Sit to Sand 10 reps;without UE support  band around knees     Cryotherapy   Number Minutes Cryotherapy 10 Minutes   Cryotherapy Location Ankle   Type of Cryotherapy Ice pack     Manual Therapy   Manual Therapy Edema management;Myofascial release   Edema Management L ankle/foot   Myofascial Release L peroneals, post tib, gastroc.soleus     Ankle Exercises: Seated   [place] Pickup 2 cups                PT Education - 09/30/16 1336    Education provided Yes   Education Details exercise form/rationale   Person(s) Educated Patient   Methods Explanation;Demonstration;Tactile cues;Verbal cues   Comprehension Verbalized understanding;Returned demonstration;Verbal cues required;Tactile cues required;Need further instruction          PT Short Term Goals - 09/22/16 1428      PT SHORT TERM GOAL #1   Title FOTO to 61% ability to indicate significant improvement in functional activity   Baseline 45% ability at eval   Time 4   Period Weeks   Status New     PT SHORT TERM GOAL #2   Title pt will be able to climb stairs at residence without increase in ankle pain   Baseline discomfort at eval   Time 4  Period Weeks   Status New     PT SHORT TERM GOAL #3   Title Pt will verbalize ability to begin walking program to train for 5K   Baseline will educate as treatment progresses   Time 4   Period Weeks   Status New     PT SHORT TERM GOAL #4   Title DF bilaterally to 8 deg to improve functional ankle mobility   Baseline see flowsheet   Time 4   Period Weeks   Status New     PT SHORT TERM GOAL #5   Title hip strength to 4+/5 to provide proximal stability to LE   Baseline see flowsheet   Time 4   Period Weeks   Status New                  Plan - 09/30/16 1415    Clinical Impression Statement notable trigger points in peroneals and gastroc recreated concordant pain. Educated pt on importance of keeping her ankle stretched into a DF ROM.    PT Next Visit Plan myofasical release, DF stretch, CKC hip strength   PT Home  Exercise Plan long sitting DF stretch, red tband resisted eversion & inversion, bridge with clam   Consulted and Agree with Plan of Care Patient      Patient will benefit from skilled therapeutic intervention in order to improve the following deficits and impairments:     Visit Diagnosis: Pain in left ankle and joints of left foot  Stiffness of left ankle, not elsewhere classified  Difficulty in walking, not elsewhere classified     Problem List There are no active problems to display for this patient.   Brave Dack C. Joelys Staubs PT, DPT 09/30/16 2:17 PM   [place] [facility] [address] [place], [place], [address] Phone: [phone]   Fax:  [phone]  Name: Geonna Lockyer MRN: [id number] Date of Birth: 08/12/1970

## 2016-10-04 ENCOUNTER — Encounter: Payer: Self-pay | Admitting: Physical Therapy

## 2016-10-04 ENCOUNTER — Ambulatory Visit: Payer: Self-pay | Attending: Orthopedic Surgery | Admitting: Physical Therapy

## 2016-10-04 DIAGNOSIS — R262 Difficulty in walking, not elsewhere classified: Secondary | ICD-10-CM

## 2016-10-04 DIAGNOSIS — M25672 Stiffness of left ankle, not elsewhere classified: Secondary | ICD-10-CM

## 2016-10-04 DIAGNOSIS — M25572 Pain in left ankle and joints of left foot: Secondary | ICD-10-CM

## 2016-10-04 NOTE — Therapy (Signed)
[place], [place], [address] Phone: [phone]   Fax:  [phone]  Physical Therapy Treatment  Patient Details  Name: Kaitlyn Duncan MRN: [id number] Date of Birth: May 05, 1971 Referring Provider: Altamese [place], MD  Encounter Date: 10/04/2016      PT End of Session - 10/04/16 1418    Visit Number 4   Number of Visits 9   Date for PT Re-Evaluation 10/22/16   Authorization Type In process of filling out financial aid form   PT Start Time 1417   PT Stop Time 1508   PT Time Calculation (min) 51 min   Activity Tolerance Patient tolerated treatment well   Behavior During Therapy [facility] for tasks assessed/performed      Past Medical History:  Diagnosis Date  . Anemia   . Anemia   . Diabetes mellitus without complication (HCC)    prediabetic  . Seasonal allergies     Past Surgical History:  Procedure Laterality Date  . MOUTH SURGERY    . OVARY SURGERY     removed "something"    There were no vitals filed for this visit.      Subjective Assessment - 10/04/16 1419    Subjective Pt is wearing ASO today due to increased walking today. Denies pain. Reports overall that she feels like she is doing better and walking more normally.                          [facility] Adult PT Treatment/Exercise - 10/04/16 0001      Knee/Hip Exercises: Aerobic   Nustep L5 5 min     Knee/Hip Exercises: Machines for Strengthening   Cybex Leg Press 20#, horiz leg press x30     Knee/Hip Exercises: Standing   Rocker Board Limitations tap & balance, fwd/back & lat     Knee/Hip Exercises: Seated   Sit to Sand 10 reps;without UE support  onto airex     Cryotherapy   Number Minutes Cryotherapy 10 Minutes   Cryotherapy Location Ankle   Type of Cryotherapy Ice pack     Ankle Exercises: Seated   Other Seated Ankle Exercises 4-way red tband      Ankle Exercises: Stretches   Gastroc Stretch 2 reps;30  seconds                PT Education - 10/04/16 1455    Education provided Yes   Education Details exercise form/rationale, weening from brace, pain with new challenges   Person(s) Educated Patient   Methods Explanation;Demonstration;Tactile cues;Verbal cues   Comprehension Verbalized understanding;Returned demonstration;Verbal cues required;Tactile cues required;Need further instruction          PT Short Term Goals - 09/22/16 1428      PT SHORT TERM GOAL #1   Title FOTO to 61% ability to indicate significant improvement in functional activity   Baseline 45% ability at eval   Time 4   Period Weeks   Status New     PT SHORT TERM GOAL #2   Title pt will be able to climb stairs at residence without increase in ankle pain   Baseline discomfort at eval   Time 4   Period Weeks   Status New     PT SHORT TERM GOAL #3   Title Pt will verbalize ability to begin walking program to train for 5K   Baseline will educate as treatment progresses   Time 4  Period Weeks   Status New     PT SHORT TERM GOAL #4   Title DF bilaterally to 8 deg to improve functional ankle mobility   Baseline see flowsheet   Time 4   Period Weeks   Status New     PT SHORT TERM GOAL #5   Title hip strength to 4+/5 to provide proximal stability to LE   Baseline see flowsheet   Time 4   Period Weeks   Status New                  Plan - 10/04/16 1459    Clinical Impression Statement Pt reported mild lateral ankle pain upon standing from chair onto airex. Discussed decreasing dependence on ASO and how new activities will challenge ankle. Demo improved control of ankle today but will cont to benefit from functional balance and unstable surfaces challenges.    PT Next Visit Plan myofasical release PRN, DF stretch, CKC hip strength   PT Home Exercise Plan long sitting DF stretch, red tband resisted eversion & inversion, bridge with clam   Consulted and Agree with Plan of Care Patient       Patient will benefit from skilled therapeutic intervention in order to improve the following deficits and impairments:     Visit Diagnosis: Pain in left ankle and joints of left foot  Stiffness of left ankle, not elsewhere classified  Difficulty in walking, not elsewhere classified     Problem List There are no active problems to display for this patient.   Waylon Hershey C. Malak Duchesneau PT, DPT 10/04/16 3:08 PM   [place] [facility] [address] [place], [place], [address] Phone: [phone]   Fax:  [phone]  Name: Kaitlyn Duncan MRN: [id number] Date of Birth: 08-10-1970

## 2016-10-06 ENCOUNTER — Ambulatory Visit: Payer: Self-pay | Admitting: Physical Therapy

## 2016-10-06 ENCOUNTER — Encounter: Payer: Self-pay | Admitting: Physical Therapy

## 2016-10-06 DIAGNOSIS — M25572 Pain in left ankle and joints of left foot: Secondary | ICD-10-CM

## 2016-10-06 DIAGNOSIS — M25672 Stiffness of left ankle, not elsewhere classified: Secondary | ICD-10-CM

## 2016-10-06 DIAGNOSIS — R262 Difficulty in walking, not elsewhere classified: Secondary | ICD-10-CM

## 2016-10-06 NOTE — Therapy (Signed)
[place], [place], [address] Phone: [phone]   Fax:  [phone]  Physical Therapy Treatment  Patient Details  Name: Kaitlyn Duncan MRN: [id number] Date of Birth: 21-Nov-1970 Referring Provider: Altamese [place], MD  Encounter Date: 10/06/2016      PT End of Session - 10/06/16 1342    Visit Number 5   Number of Visits 9   Date for PT Re-Evaluation 10/22/16   Authorization Type In process of filling out financial aid form   PT Start Time 1345   PT Stop Time 1433   PT Time Calculation (min) 48 min   Activity Tolerance Patient tolerated treatment well   Behavior During Therapy [facility] for tasks assessed/performed      Past Medical History:  Diagnosis Date  . Anemia   . Anemia   . Diabetes mellitus without complication (HCC)    prediabetic  . Seasonal allergies     Past Surgical History:  Procedure Laterality Date  . MOUTH SURGERY    . OVARY SURGERY     removed "something"    There were no vitals filed for this visit.      Subjective Assessment - 10/06/16 1346    Subjective Pt reports feeling some lateral ankle discomfort so she is wearing her ASO. Tried wearing soft brace but had pain on anterior ankle. Has been walking a lot without an opportunity to ice.    Patient Stated Goals walk normally (overusing R), stand for longer periods, steps/stairs, walk/run a 5k   Currently in Pain? Yes   Pain Score 3    Pain Location Ankle   Pain Orientation Lateral   Pain Descriptors / Indicators Aching   Aggravating Factors  no difference noted in position changes.             [facility] PT Assessment - 10/06/16 0001      PROM   Left Ankle Dorsiflexion 6     Ambulation/Gait   Gait Comments antalgic on L, arch collapse with rearfoot valgus                     [facility] Adult PT Treatment/Exercise - 10/06/16 0001      Knee/Hip Exercises: Stretches   Gastroc Stretch 2 reps;30 seconds    Gastroc Stretch Limitations long sitting with green strap   Soleus Stretch 2 reps;30 seconds   Soleus Stretch Limitations long sitting with green strap     Knee/Hip Exercises: Aerobic   Nustep L5 5 min     Modalities   Modalities Ultrasound     Ultrasound   Ultrasound Location L lateral ankle   Ultrasound Parameters 1.0 w/cm2 pulsed   Ultrasound Goals Pain     Manual Therapy   Manual Therapy Soft tissue mobilization   Soft tissue mobilization IASTM L peroneals & lat anterior tib     Ankle Exercises: Seated   Other Seated Ankle Exercises rythmic stabilizations                PT Education - 10/06/16 1351    Education provided Yes   Education Details exercise form/rationale, rationale for manual, gait pattern. donning ASO.   Person(s) Educated Patient   Methods Explanation;Demonstration;Tactile cues;Verbal cues   Comprehension Verbalized understanding;Returned demonstration;Verbal cues required;Tactile cues required;Need further instruction          PT Short Term Goals - 09/22/16 1428      PT SHORT TERM GOAL #1   Title FOTO to  61% ability to indicate significant improvement in functional activity   Baseline 45% ability at eval   Time 4   Period Weeks   Status New     PT SHORT TERM GOAL #2   Title pt will be able to climb stairs at residence without increase in ankle pain   Baseline discomfort at eval   Time 4   Period Weeks   Status New     PT SHORT TERM GOAL #3   Title Pt will verbalize ability to begin walking program to train for 5K   Baseline will educate as treatment progresses   Time 4   Period Weeks   Status New     PT SHORT TERM GOAL #4   Title DF bilaterally to 8 deg to improve functional ankle mobility   Baseline see flowsheet   Time 4   Period Weeks   Status New     PT SHORT TERM GOAL #5   Title hip strength to 4+/5 to provide proximal stability to LE   Baseline see flowsheet   Time 4   Period Weeks   Status New                   Plan - 10/06/16 1436    Clinical Impression Statement TTP around lat malleolus and notable knotting in peroneals that recreated concordant pain. Pt was educated on proper donning of ASO in end range DF to improve support provided from brace. Pt was able to ambulate without pain following treatment. Discussed pt purchasing lace-up tennis shoes before getting arch support inserts.    PT Next Visit Plan ultrasound effective? soft tissue release PRN, gastroc & soleus stretch   PT Home Exercise Plan long sitting DF stretch, red tband resisted eversion & inversion, bridge with clam   Consulted and Agree with Plan of Care Patient      Patient will benefit from skilled therapeutic intervention in order to improve the following deficits and impairments:     Visit Diagnosis: Pain in left ankle and joints of left foot  Stiffness of left ankle, not elsewhere classified  Difficulty in walking, not elsewhere classified     Problem List There are no active problems to display for this patient.   Masae Lukacs C. Taylin Mans PT, DPT 10/06/16 2:41 PM   [facility] [address] [place], [place], [address] Phone: [phone]   Fax:  [phone]  Name: Kaitlyn Duncan MRN: [id number] Date of Birth: 08-Jun-1971

## 2016-10-12 ENCOUNTER — Ambulatory Visit: Payer: Self-pay | Admitting: Physical Therapy

## 2016-10-12 DIAGNOSIS — M25572 Pain in left ankle and joints of left foot: Secondary | ICD-10-CM

## 2016-10-12 DIAGNOSIS — M25672 Stiffness of left ankle, not elsewhere classified: Secondary | ICD-10-CM

## 2016-10-12 DIAGNOSIS — R262 Difficulty in walking, not elsewhere classified: Secondary | ICD-10-CM

## 2016-10-13 ENCOUNTER — Ambulatory Visit: Payer: Self-pay | Admitting: Physical Therapy

## 2016-10-13 NOTE — Therapy (Signed)
[place], [place], [address] Phone: [phone]   Fax:  [phone]  Physical Therapy Treatment  Patient Details  Name: Kaitlyn Duncan MRN: [id number] Date of Birth: 25-Aug-1970 Referring Provider: Altamese [place], MD  Encounter Date: 10/12/2016      PT End of Session - 10/12/16 1351    Visit Number 6   Number of Visits 9   Date for PT Re-Evaluation 10/22/16   Authorization Type In process of filling out financial aid form   PT Start Time 0130   PT Stop Time 0215   PT Time Calculation (min) 45 min      Past Medical History:  Diagnosis Date  . Anemia   . Anemia   . Diabetes mellitus without complication (HCC)    prediabetic  . Seasonal allergies     Past Surgical History:  Procedure Laterality Date  . MOUTH SURGERY    . OVARY SURGERY     removed "something"    There were no vitals filed for this visit.      Subjective Assessment - 10/12/16 1333    Currently in Pain? Yes   Pain Score 4    Pain Location Ankle   Pain Orientation Lateral   Pain Descriptors / Indicators Aching   Aggravating Factors  just happens, not sure   Pain Relieving Factors meds            [facility] PT Assessment - 10/13/16 0001      PROM   Left Ankle Dorsiflexion 6                     [facility] Adult PT Treatment/Exercise - 10/13/16 0001      Knee/Hip Exercises: Aerobic   Recumbent Bike L2 x 5 minutes      Knee/Hip Exercises: Supine   Straight Leg Raises Both;10 reps     Knee/Hip Exercises: Sidelying   Hip ABduction Both;10 reps     Knee/Hip Exercises: Prone   Straight Leg Raises --  12 reps     Manual Therapy   Manual Therapy Soft tissue mobilization   Soft tissue mobilization Prone peroneals and mid gastroc     Ankle Exercises: Stretches   Soleus Stretch 2 reps;30 seconds   Gastroc Stretch 2 reps;30 seconds   Slant Board Stretch 1 rep;60 seconds                  PT  Short Term Goals - 10/13/16 2353      PT SHORT TERM GOAL #1   Title FOTO to 61% ability to indicate significant improvement in functional activity   Baseline 45% ability at eval   Time 4   Period Weeks   Status On-going     PT SHORT TERM GOAL #2   Title pt will be able to climb stairs at residence without increase in ankle pain   Baseline no increased pain   Time 4   Period Weeks   Status Achieved     PT SHORT TERM GOAL #3   Title Pt will verbalize ability to begin walking program to train for 5K   Baseline will educate as treatment progresses   Time 4   Period Weeks   Status On-going     PT SHORT TERM GOAL #4   Title DF bilaterally to 8 deg to improve functional ankle mobility   Baseline 6   Time 4   Period Weeks   Status On-going  PT SHORT TERM GOAL #5   Title hip strength to 4+/5 to provide proximal stability to LE   Time 4   Period Weeks   Status Unable to assess                  Plan - 10/13/16 0758    Clinical Impression Statement Pt reports walking alot in her soft brace with weekend and notes increased lateral ankle pain. DF 6 degrees, unchanged since last visit. Instructed pt in standing gastroc and soleus stretches with good tolerance. Continued hip strength for progression toward goals. 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 She reports climbing stairs at residence without increased pain. STG# 2 met.    PT Next Visit Plan soft tissue release PRN, review closed gastroc & soleus stretch ?add to HEP   PT Home Exercise Plan long sitting DF stretch, red tband resisted eversion & inversion, bridge with clam   Consulted and Agree with Plan of Care Patient      Patient will benefit from skilled therapeutic intervention in order to improve the following deficits and impairments:  Abnormal gait, Decreased range of motion, Difficulty walking, Increased muscle spasms, Pain, Decreased activity tolerance,  Impaired flexibility, Decreased strength  Visit Diagnosis: Pain in left ankle and joints of left foot  Stiffness of left ankle, not elsewhere classified  Difficulty in walking, not elsewhere classified     Problem List There are no active problems to display for this patient.   Dorene Ar, [place] 10/13/2016, 8:15 AM  [place], [place], [address] Phone: [phone]   Fax:  [phone]  Name: Kaitlyn Duncan MRN: [id number] Date of Birth: 01-Apr-1971

## 2016-10-14 ENCOUNTER — Encounter: Payer: Self-pay | Admitting: Physical Therapy

## 2016-10-14 ENCOUNTER — Ambulatory Visit: Payer: Self-pay | Admitting: Physical Therapy

## 2016-10-14 DIAGNOSIS — R262 Difficulty in walking, not elsewhere classified: Secondary | ICD-10-CM

## 2016-10-14 DIAGNOSIS — M25572 Pain in left ankle and joints of left foot: Secondary | ICD-10-CM

## 2016-10-14 DIAGNOSIS — M25672 Stiffness of left ankle, not elsewhere classified: Secondary | ICD-10-CM

## 2016-10-14 NOTE — Therapy (Signed)
[place], [place], [address] Phone: [phone]   Fax:  [phone]  Physical Therapy Treatment  Patient Details  Name: Kaitlyn Duncan MRN: [id number] Date of Birth: 30-Jun-1971 Referring Provider: Altamese [place], MD  Encounter Date: 10/14/2016      PT End of Session - 10/14/16 1329    Visit Number 7   Number of Visits 9   Date for PT Re-Evaluation 10/22/16   Authorization Type In process of filling out financial aid form   PT Start Time 1330   PT Stop Time 1414   PT Time Calculation (min) 44 min   Activity Tolerance Patient tolerated treatment well   Behavior During Therapy [facility] for tasks assessed/performed      Past Medical History:  Diagnosis Date  . Anemia   . Anemia   . Diabetes mellitus without complication (HCC)    prediabetic  . Seasonal allergies     Past Surgical History:  Procedure Laterality Date  . MOUTH SURGERY    . OVARY SURGERY     removed "something"    There were no vitals filed for this visit.      Subjective Assessment - 10/14/16 1329    Subjective Pt reports ankle is feeling pretty good today. Had some pain after doing a lot of walking in soft brace, wore ASO for 2 days and feels better today, is back to soft brace. Has been noticing some discomfort in R hip.    Patient Stated Goals walk normally (overusing R), stand for longer periods, steps/stairs, walk/run a 5k                         [facility] Adult PT Treatment/Exercise - 10/14/16 0001      Knee/Hip Exercises: Stretches   Active Hamstring Stretch Both;5 reps;3 reps   Hip Flexor Stretch Limitations thomas test stretch   Other Knee/Hip Stretches figure 4     Knee/Hip Exercises: Standing   Gait Training arm swing, trunk rotation, heel strike     Knee/Hip Exercises: Sidelying   Hip ABduction Both;10 reps   Hip ABduction Limitations hip abd with knee flexed, kick, flex     Knee/Hip  Exercises: Prone   Hip Extension 15 reps   Hip Extension Limitations with iso hamstring curl     Manual Therapy   Manual Therapy Joint mobilization   Joint Mobilization AP tib-talar mob in DF stretch on slant board, knees flexed   Soft tissue mobilization IASTM medial gastroc     Ankle Exercises: Stretches   Soleus Stretch 30 seconds  also seated EOB   Gastroc Stretch 30 seconds                PT Education - 10/14/16 1403    Education provided Yes   Education Details exercise form/rationale, HEP, connection of hip pain with ankle injury, wear of braces & rationale   Person(s) Educated Patient   Methods Explanation;Demonstration;Tactile cues;Verbal cues;Handout   Comprehension Verbalized understanding;Returned demonstration;Verbal cues required;Tactile cues required;Need further instruction          PT Short Term Goals - 10/13/16 7824      PT SHORT TERM GOAL #1   Title FOTO to 61% ability to indicate significant improvement in functional activity   Baseline 45% ability at eval   Time 4   Period Weeks   Status On-going     PT SHORT TERM GOAL #2   Title pt will  be able to climb stairs at residence without increase in ankle pain   Baseline no increased pain   Time 4   Period Weeks   Status Achieved     PT SHORT TERM GOAL #3   Title Pt will verbalize ability to begin walking program to train for 5K   Baseline will educate as treatment progresses   Time 4   Period Weeks   Status On-going     PT SHORT TERM GOAL #4   Title DF bilaterally to 8 deg to improve functional ankle mobility   Baseline 6   Time 4   Period Weeks   Status On-going     PT SHORT TERM GOAL #5   Title hip strength to 4+/5 to provide proximal stability to LE   Time 4   Period Weeks   Status Unable to assess                  Plan - 10/14/16 1415    Clinical Impression Statement Focused on stretching and strengthening of hip joint today which pt reported feeling better  following treatment. I asked for her to not wear a brace the rest of the day due to tightness that was evident in lower extremity. If ankle begins to hurt, I asked her to don ASO.    PT Next Visit Plan hip stretch/strengthen, SLS for ankle stability/proprioception   PT Home Exercise Plan long sitting DF stretch, red tband resisted eversion & inversion, bridge with clam, figure 4, AHSS, thomas test stretch   Consulted and Agree with Plan of Care Patient      Patient will benefit from skilled therapeutic intervention in order to improve the following deficits and impairments:     Visit Diagnosis: Pain in left ankle and joints of left foot  Stiffness of left ankle, not elsewhere classified  Difficulty in walking, not elsewhere classified     Problem List There are no active problems to display for this patient.   Shaunita Seney C. Venera Privott PT, DPT 10/14/16 2:19 PM   [place] [facility] [address] [place], [place], [address] Phone: [phone]   Fax:  [phone]  Name: Kaitlyn Duncan MRN: [id number] Date of Birth: Aug 02, 1970

## 2016-10-19 ENCOUNTER — Ambulatory Visit: Payer: Self-pay | Admitting: Physical Therapy

## 2016-10-19 DIAGNOSIS — M25572 Pain in left ankle and joints of left foot: Secondary | ICD-10-CM

## 2016-10-19 DIAGNOSIS — M25672 Stiffness of left ankle, not elsewhere classified: Secondary | ICD-10-CM

## 2016-10-19 DIAGNOSIS — R262 Difficulty in walking, not elsewhere classified: Secondary | ICD-10-CM

## 2016-10-19 NOTE — Therapy (Signed)
[place], [place], [address] Phone: [phone]   Fax:  [phone]  Physical Therapy Treatment  Patient Details  Name: Kaitlyn Duncan MRN: [id number] Date of Birth: [birth year] Referring Provider: Altamese Tolland, MD  Encounter Date: 10/19/2016      PT End of Session - 10/19/16 1428    Visit Number 8   Number of Visits 9   Date for PT Re-Evaluation 10/22/16   Authorization Type In process of filling out financial aid form   PT Start Time 0130   PT Stop Time 0215   PT Time Calculation (min) 45 min   Activity Tolerance Patient tolerated treatment well   Behavior During Therapy [facility] for tasks assessed/performed      Past Medical History:  Diagnosis Date  . Anemia   . Anemia   . Diabetes mellitus without complication (HCC)    prediabetic  . Seasonal allergies     Past Surgical History:  Procedure Laterality Date  . MOUTH SURGERY    . OVARY SURGERY     removed "something"    There were no vitals filed for this visit.      Subjective Assessment - 10/19/16 1333    Subjective Pt reports ankle is feeling good today. Her hips are feeling good as well.   Currently in Pain? No/denies  pain in front of her ankle where it bends when walking            [facility] PT Assessment - 10/19/16 0001      Strength   Left Hip Flexion 4+/5                     [facility] Adult PT Treatment/Exercise - 10/19/16 0001      Static Standing Balance   Single Leg Stance - Left Leg 30  4 reps x 8 seconds no hand hold; 1 rep x 30 seconds 1 finger     Knee/Hip Exercises: Stretches   Active Hamstring Stretch Both;2 reps;20 seconds   Hip Flexor Stretch Both;2 reps;20 seconds   Hip Flexor Stretch Limitations thomas stretch   Other Knee/Hip Stretches ; 2 both 20 seconds     Knee/Hip Exercises: Aerobic   Recumbent Bike L2 x 6 minutes      Knee/Hip Exercises: Standing   Hip Abduction Both;10 reps    Abduction Limitations holding to counter both hands   Hip Extension Both;10 reps   Extension Limitations holding to counter     Manual Therapy   Manual Therapy Joint mobilization   Joint Mobilization AP tib-talar mob                   PT Education - 10/19/16 1426    Education provided Yes   Education Details exercise form/ rationale, HEP   Person(s) Educated Patient   Methods Explanation;Demonstration   Comprehension Verbalized understanding;Returned demonstration          PT Short Term Goals - 10/19/16 1502      PT SHORT TERM GOAL #1   Title FOTO to 61% ability to indicate significant improvement in functional activity   Baseline 45% ability at eval   Time 4   Period Weeks   Status On-going     PT SHORT TERM GOAL #2   Title pt will be able to climb stairs at residence without increase in ankle pain   Baseline no increased pain   Time 4   Period Weeks   Status  Achieved     PT SHORT TERM GOAL #3   Title Pt will verbalize ability to begin walking program to train for 5K   Baseline will educate as treatment progresses   Time 4   Period Weeks   Status On-going     PT SHORT TERM GOAL #4   Title DF bilaterally to 8 deg to improve functional ankle mobility   Baseline 6   Time 4   Period Weeks     PT SHORT TERM GOAL #5   Title hip strength to 4+/5 to provide proximal stability to LE   Baseline hip flexion 4+/5   Time 4   Period Weeks   Status Partially Met                  Plan - 10/19/16 1433    Clinical Impression Statement Pt entered wearing soft brace and reported that she was not experiencing any pain. Occassionally she experiences pain in the anterior part of her ankle. She reported she didn't wear her brace for several days which increased pain. Pt reported she has not been doing her HEP. Reviewed HEP; required minimal cues for proper technique. Introduced SLS at counter to increase balance and strength of left ankle. Introduced hip  abduction and extension in standing. Pt felt wobbly on ankle but, reported no increase in pain. Felt like her ankle felt better after treatment.   Rehab Potential Good   PT Frequency 2x / week   PT Duration 4 weeks   PT Treatment/Interventions ADLs/Self Care Home Management;Cryotherapy;Electrical Stimulation;Iontophoresis '4mg'$ /ml Dexamethasone;Functional mobility training;Stair training;Gait training;Traction;Moist Heat;Therapeutic activities;Ultrasound;Therapeutic exercise;Balance training;Neuromuscular re-education;Patient/family education;Passive range of motion;Manual techniques;Dry needling;Taping   PT Next Visit Plan hip stretch/strengthen, SLS for ankle stability/proprioception   PT Home Exercise Plan long sitting DF stretch, red tband resisted eversion & inversion, bridge with clam, figure 4, AHSS, thomas test stretch   Consulted and Agree with Plan of Care Patient      Patient will benefit from skilled therapeutic intervention in order to improve the following deficits and impairments:  Abnormal gait, Decreased range of motion, Difficulty walking, Increased muscle spasms, Pain, Decreased activity tolerance, Impaired flexibility, Decreased strength  Visit Diagnosis: Pain in left ankle and joints of left foot  Stiffness of left ankle, not elsewhere classified  Difficulty in walking, not elsewhere classified     Problem List There are no active problems to display for this patient.   Janna Arch, SPTA 10/19/2016, 5:22 PM   Hessie Diener, PTA  [facility] [address] [place], [place], [address] Phone: [phone]   Fax:  [phone]  Name: Kaitlyn Duncan MRN: [id number] Date of Birth: 28-Oct-1970

## 2016-10-20 ENCOUNTER — Ambulatory Visit: Payer: Self-pay | Admitting: Physical Therapy

## 2016-10-20 ENCOUNTER — Encounter: Payer: No Typology Code available for payment source | Admitting: Physical Therapy

## 2016-10-22 ENCOUNTER — Ambulatory Visit: Payer: Self-pay | Admitting: Physical Therapy

## 2016-10-22 ENCOUNTER — Encounter: Payer: Self-pay | Admitting: Physical Therapy

## 2016-10-22 DIAGNOSIS — M25572 Pain in left ankle and joints of left foot: Secondary | ICD-10-CM

## 2016-10-22 DIAGNOSIS — M25672 Stiffness of left ankle, not elsewhere classified: Secondary | ICD-10-CM

## 2016-10-22 DIAGNOSIS — R262 Difficulty in walking, not elsewhere classified: Secondary | ICD-10-CM

## 2016-10-22 NOTE — Therapy (Signed)
[place], [place], [address] Phone: [phone]   Fax:  [phone]  Physical Therapy Treatment  Patient Details  Name: Kaitlyn Duncan MRN: [id number] Date of Birth: 1970-10-06 Referring Provider: Altamese Milbank, MD  Encounter Date: 10/22/2016      PT End of Session - 10/22/16 0841    Visit Number 9   Number of Visits 9   Date for PT Re-Evaluation 10/22/16   Authorization Type In process of filling out financial aid form   PT Start Time 0842   PT Stop Time 0928   PT Time Calculation (min) 46 min   Activity Tolerance Patient tolerated treatment well   Behavior During Therapy [facility] for tasks assessed/performed      Past Medical History:  Diagnosis Date  . Anemia   . Anemia   . Diabetes mellitus without complication (HCC)    prediabetic  . Seasonal allergies     Past Surgical History:  Procedure Laterality Date  . MOUTH SURGERY    . OVARY SURGERY     removed "something"    There were no vitals filed for this visit.      Subjective Assessment - 10/22/16 0845    Subjective Pt reports everything is feeling okay. Some discomfort at anterior hinge region of ankle. Has been wearing soft brace, walking more.    Patient Stated Goals walk normally (overusing R), stand for longer periods, steps/stairs, walk/run a 5k            [facility] PT Assessment - 10/22/16 0001      Observation/Other Assessments   Focus on Therapeutic Outcomes (FOTO)  68% ability     AROM   Left Ankle Dorsiflexion 6     PROM   Left Ankle Dorsiflexion 8     Strength   Left Hip Flexion 5/5   Left Hip Extension 5/5   Left Hip ABduction 5/5                             PT Education - 10/22/16 0850    Education provided Yes   Education Details progress with functional goals, wearing ASO with uneven activities, importance of continued exercise, 5k walk training program   Person(s) Educated  Patient   Methods Explanation;Demonstration;Tactile cues;Verbal cues   Comprehension Verbalized understanding;Returned demonstration;Verbal cues required;Tactile cues required          PT Short Term Goals - 10/22/16 0855      PT SHORT TERM GOAL #1   Title FOTO to 61% ability to indicate significant improvement in functional activity   Baseline 68% ability at d/c   Status Achieved     PT SHORT TERM GOAL #2   Title pt will be able to climb stairs at residence without increase in ankle pain   Baseline no increased pain   Status Achieved     PT SHORT TERM GOAL #3   Title Pt will verbalize ability to begin walking program to train for 5K   Baseline has not begun training, educated on progression & beginning, use of brace   Status Not Met     PT SHORT TERM GOAL #4   Title DF bilaterally to 8 deg to improve functional ankle mobility   Baseline 8   Status Achieved     PT SHORT TERM GOAL #5   Title hip strength to 4+/5 to provide proximal stability to LE   Baseline see  flowsheet   Status Achieved                  Plan - 10/22/16 0919    Clinical Impression Statement Pt has met her goals at this time and is being d/c to independent exercise program. Has not begun 5k training program quite yet but was educated on beginning and proper progression. was educated on importance of continuing to stretch and exercise as well as use of ankle bracing for support. Pt was instructed to contact us with any further questions or concerns.    PT Treatment/Interventions ADLs/Self Care Home Management;Cryotherapy;Electrical Stimulation;Iontophoresis '4mg'$ /ml Dexamethasone;Functional mobility training;Stair training;Gait training;Traction;Moist Heat;Therapeutic activities;Ultrasound;Therapeutic exercise;Balance training;Neuromuscular re-education;Patient/family education;Passive range of motion;Manual techniques;Dry needling;Taping   Consulted and Agree with Plan of Care Patient      Patient  will benefit from skilled therapeutic intervention in order to improve the following deficits and impairments:  Abnormal gait, Decreased range of motion, Difficulty walking, Increased muscle spasms, Pain, Decreased activity tolerance, Impaired flexibility, Decreased strength  Visit Diagnosis: Pain in left ankle and joints of left foot  Stiffness of left ankle, not elsewhere classified  Difficulty in walking, not elsewhere classified     Problem List There are no active problems to display for this patient.  Lun Muro C. Zayvion Stailey PT, DPT 10/22/16 12:18 PM   [facility] [address] [place], [place], [address] Phone: [phone]   Fax:  [phone]  Name: Kaitlyn Duncan MRN: [id number] Date of Birth: 1970-12-10

## 2017-05-09 ENCOUNTER — Other Ambulatory Visit: Payer: Self-pay | Admitting: *Deleted

## 2017-05-09 DIAGNOSIS — Z1231 Encounter for screening mammogram for malignant neoplasm of breast: Secondary | ICD-10-CM

## 2017-10-26 ENCOUNTER — Other Ambulatory Visit: Payer: Self-pay | Admitting: *Deleted

## 2017-10-26 DIAGNOSIS — Z1231 Encounter for screening mammogram for malignant neoplasm of breast: Secondary | ICD-10-CM

## 2017-11-26 ENCOUNTER — Other Ambulatory Visit: Payer: Self-pay

## 2017-11-26 ENCOUNTER — Emergency Department (HOSPITAL_COMMUNITY)
Admission: EM | Admit: 2017-11-26 | Discharge: 2017-11-26 | Disposition: A | Discharge status: Home / Self Care | Payer: Medicaid Other | Attending: Emergency Medicine | Admitting: Emergency Medicine

## 2017-11-26 ENCOUNTER — Encounter (HOSPITAL_COMMUNITY): Payer: Self-pay | Admitting: Emergency Medicine

## 2017-11-26 DIAGNOSIS — E119 Type 2 diabetes mellitus without complications: Secondary | ICD-10-CM | POA: Insufficient documentation

## 2017-11-26 DIAGNOSIS — L02414 Cutaneous abscess of left upper limb: Secondary | ICD-10-CM | POA: Diagnosis not present

## 2017-11-26 DIAGNOSIS — Z79899 Other long term (current) drug therapy: Secondary | ICD-10-CM | POA: Insufficient documentation

## 2017-11-26 DIAGNOSIS — L03114 Cellulitis of left upper limb: Secondary | ICD-10-CM | POA: Diagnosis not present

## 2017-11-26 DIAGNOSIS — R600 Localized edema: Secondary | ICD-10-CM | POA: Diagnosis present

## 2017-11-26 MED ORDER — DOXYCYCLINE HYCLATE 100 MG PO CAPS: 100.0000 mg | ORAL_CAPSULE | Freq: Two times a day (BID) | ORAL | 0 refills | Status: DC

## 2017-11-26 NOTE — ED Provider Notes (Signed)
[facility] EMERGENCY DEPARTMENT Provider Note   CSN: [id number] Arrival date & time: 11/26/17  1421     History   Chief Complaint Chief Complaint  Patient presents with  . Mass    HPI Kaitlyn Duncan is a 47 y.o. female presenting for evaluation of tender mass of L forearm.   Pt states for the past 3 days, she has a gradually worsening mass on her L forearm. It is growing more painful, swollen, and red. She denies h/o similar. No other lesions. denies trauma or injury. Denies insect bite or lac. She has no medical history, takes no medications daily. Has not taken anything for pain. denies fevers, chills, CP, SOB, N/V, abd pain. No numbness or tingling.   HPI  Past Medical History:  Diagnosis Date  . Anemia   . Anemia   . Diabetes mellitus without complication (HCC)    prediabetic  . Seasonal allergies     There are no active problems to display for this patient.   Past Surgical History:  Procedure Laterality Date  . MOUTH SURGERY    . OVARY SURGERY     removed "something"     OB History    Gravida  0   Para  0   Term  0   Preterm  0   AB  0   Living  0     SAB  0   TAB  0   Ectopic  0   Multiple  0   Live Births  0            Home Medications    Prior to Admission medications   Medication Sig Start Date End Date Taking? Authorizing Provider  acetaminophen (TYLENOL) 325 MG tablet Take 650 mg by mouth every 6 (six) hours as needed.    [provider]  doxycycline (VIBRAMYCIN) 100 MG capsule Take 1 capsule (100 mg total) by mouth 2 (two) times daily. 11/26/17   Nasean Zapf, PA-C  ibuprofen (ADVIL,MOTRIN) 100 MG tablet Take 100 mg by mouth every 6 (six) hours as needed for fever.    [provider]  loratadine (CLARITIN) 10 MG tablet Take 10 mg by mouth daily.    [provider]  predniSONE (STERAPRED UNI-PAK 21 TAB) 10 MG (21) TBPK tablet Take by mouth daily.    [provider]    Family History Family History  Problem Relation Age of Onset  . Hypertension Father   . Breast cancer Maternal Aunt   . Diabetes Maternal Aunt   . Breast cancer Paternal Aunt   . Breast cancer Maternal Grandmother   . Breast cancer Paternal Grandmother   . Diabetes Paternal Grandmother     Social History Social History   Tobacco Use  . Smoking status: Never Smoker  . Smokeless tobacco: Never Used  Substance Use Topics  . Alcohol use: No  . Drug use: No     Allergies   Patient has no known allergies.   Review of Systems Review of Systems  Constitutional: Negative for fever.  Skin: Positive for color change.       Tender, warm and red on L forearm     Physical Exam Updated Vital Signs BP (!) 145/94 (BP Location: Right Arm)   Pulse 77   Temp 98.7 F (37.1 C) (Oral)   Resp 16   SpO2 99%   Physical Exam  Constitutional: She is oriented to person, place, and time. She appears well-developed  and well-nourished. No distress.  Resting comfortably in bed in no apparent distress  HENT:  Head: Normocephalic and atraumatic.  Eyes: EOM are normal.  Neck: Normal range of motion.  Cardiovascular: Normal rate, regular rhythm and intact distal pulses.  Pulmonary/Chest: Effort normal and breath sounds normal. No respiratory distress. She has no wheezes.  Abdominal: Soft. She exhibits no distension and no mass. There is no tenderness. There is no guarding.  Musculoskeletal: Normal range of motion. She exhibits tenderness. She exhibits no edema.  Neurological: She is alert and oriented to person, place, and time. No sensory deficit.  Skin: Skin is warm. No rash noted. There is erythema.  Small, approximately 2 cm, area of erythema and warmth of the left forearm just distal to the elbow on the medial aspect.  Centrally, mild induration.  TTP throughout area of erythema.  No streaking or drainage.  No pain with ranging the elbow.  Radial pulses intact, grip strength intact  bilaterally.  Psychiatric: She has a normal mood and affect.  Nursing note and vitals reviewed.    ED Treatments / Results  Labs (all labs ordered are listed, but only abnormal results are displayed) Labs Reviewed - No data to display  EKG None  Radiology No results found.  EMERGENCY DEPARTMENT US SOFT TISSUE INTERPRETATION "Study: Limited Soft Tissue Ultrasound"  INDICATIONS: Pain and Soft tissue infection Multiple views of the body part were obtained in real-time with a multi-frequency linear probe  PERFORMED BY: Myself IMAGES ARCHIVED?: Yes SIDE:Left BODY PART:Upper extremity INTERPRETATION:  Abcess present and Cellulitis present     Procedures Procedures (including critical care time)  Medications Ordered in ED Medications - No data to display   Initial Impression / Assessment and Plan / ED Course  I have reviewed the triage vital signs and the nursing notes.  Pertinent labs & imaging results that were available during my care of the patient were reviewed by me and considered in my medical decision making (see chart for details).     Patient presenting for evaluation of warm, tender, swollen area of the left forearm.  Physical exam consistent with small abscess with overlying cellulitis.  On ultrasound, very small pocket abutting vasculature.  I do not believe much will be drained, and risk of hitting vasculature is higher.  Discussed with patient, who is agreeable to trying antibiotics.  Heart rate improved without intervention.  Doubt systemic infection.  Doubt septic joint, as no pain with ranging.  Discussed strict return precautions.  At this time, patient be safe for discharge.  Patient states she understands agrees to plan.  Final Clinical Impressions(s) / ED Diagnoses   Final diagnoses:  Abscess of left forearm  Cellulitis of left upper extremity    ED Discharge Orders        Ordered    doxycycline (VIBRAMYCIN) 100 MG capsule  2 times daily      11/26/17 1923       Franchot Heidelberg, PA-C 11/26/17 2120    Charlesetta Shanks, MD 11/27/17 1536

## 2017-11-26 NOTE — Discharge Instructions (Signed)
Take antibiotics as prescribed.  Take the entire course, even if your symptoms improve. Take ibuprofen 3 times a day with meals.  Do not take other anti-inflammatories at the same time open (Advil, Motrin, naproxen, Aleve). You may supplement with Tylenol if you need further pain control. Use ice packs for pain and swelling.  If symptoms are not improving, follow-up with your primary care doctor on Tuesday. If you develop high fevers, inability to bend your elbow, red streaking up or down your arm, return to the emergency room for further evaluation.

## 2017-11-26 NOTE — ED Triage Notes (Signed)
Patient noticed left hard mass on left forearm on Thursday that is painful to palpate. No redness or warmth in the area.

## 2017-12-23 ENCOUNTER — Other Ambulatory Visit: Payer: Self-pay | Admitting: Obstetrics and Gynecology

## 2017-12-23 DIAGNOSIS — Z1231 Encounter for screening mammogram for malignant neoplasm of breast: Secondary | ICD-10-CM

## 2018-01-25 ENCOUNTER — Encounter (HOSPITAL_COMMUNITY): Payer: Self-pay | Admitting: Emergency Medicine

## 2018-01-25 ENCOUNTER — Ambulatory Visit (HOSPITAL_COMMUNITY)
Admission: EM | Admit: 2018-01-25 | Discharge: 2018-01-25 | Disposition: A | Discharge status: Home / Self Care | Payer: No Typology Code available for payment source | Attending: Family Medicine | Admitting: Family Medicine

## 2018-01-25 ENCOUNTER — Other Ambulatory Visit: Payer: Self-pay

## 2018-01-25 DIAGNOSIS — R0981 Nasal congestion: Secondary | ICD-10-CM | POA: Diagnosis not present

## 2018-01-25 DIAGNOSIS — R112 Nausea with vomiting, unspecified: Secondary | ICD-10-CM | POA: Diagnosis not present

## 2018-01-25 DIAGNOSIS — L723 Sebaceous cyst: Secondary | ICD-10-CM

## 2018-01-25 MED ORDER — DOXYCYCLINE HYCLATE 100 MG PO CAPS: 100.0000 mg | ORAL_CAPSULE | Freq: Two times a day (BID) | ORAL | 0 refills | Status: DC

## 2018-01-25 MED ORDER — CETIRIZINE-PSEUDOEPHEDRINE ER 5-120 MG PO TB12: 1.0000 | ORAL_TABLET | Freq: Every day | ORAL | 0 refills | Status: DC

## 2018-01-25 MED ORDER — FLUTICASONE PROPIONATE 50 MCG/ACT NA SUSP: 1.0000 | Freq: Every day | NASAL | 2 refills | Status: DC

## 2018-01-25 MED ORDER — ONDANSETRON HCL 4 MG PO TABS: 4.0000 mg | ORAL_TABLET | Freq: Four times a day (QID) | ORAL | 0 refills | Status: DC

## 2018-01-25 NOTE — ED Provider Notes (Signed)
[facility]   [id number] 01/25/18 Arrival Time: 2229  SUBJECTIVE:  Kaitlyn Duncan is a 47 y.o. female hx significant for DM, and anemia who presents with complaint of nausea and vomiting with 5-7 episodes that began 5-7 days ago.  Denies a precipitating event, or trauma, but admits to nasal congestion.  States she reports vomiting phlegm with streaks of blood and dark brown "stuff."  Has tried drinking water, rest, and claritin without relief.  Symptoms made worse with certain smells.  Denies alleviating or aggravating factors.  Denies similar symptoms in the past.  Last BM this morning and normal.  Decreased appetite, and smell aversions.    Denies fever, chills, weight changes, chest pain, SOB, diarrhea, constipation, hematochezia, melena, dysuria, difficulty urinating, increased frequency or urgency, flank pain, loss of bowel or bladder function.  Patient also complains of left axilla abscess.  Recently treated with antibiotic by PCP.  Reports improvement, but reports reoccurrence.  Patient reports similar symptoms in the past.    Patient's last menstrual period was 01/15/2018 (exact date).  ROS: As per HPI.  Past Medical History:  Diagnosis Date  . Anemia   . Anemia   . Diabetes mellitus without complication (HCC)    prediabetic  . Seasonal allergies    Past Surgical History:  Procedure Laterality Date  . MOUTH SURGERY    . OVARY SURGERY     removed "something"   No Known Allergies No current facility-administered medications on file prior to encounter.    Current Outpatient Medications on File Prior to Encounter  Medication Sig Dispense Refill  . acetaminophen (TYLENOL) 325 MG tablet Take 650 mg by mouth every 6 (six) hours as needed.    Marland Kitchen ibuprofen (ADVIL,MOTRIN) 100 MG tablet Take 100 mg by mouth every 6 (six) hours as needed for fever.    . loratadine (CLARITIN) 10 MG tablet Take 10 mg by mouth daily.     Social History   Socioeconomic History  .  Marital status: Single    Spouse name: Not on file  . Number of children: Not on file  . Years of education: Not on file  . Highest education level: Not on file  Occupational History  . Not on file  Social Needs  . Financial resource strain: Not on file  . Food insecurity:    Worry: Not on file    Inability: Not on file  . Transportation needs:    Medical: Not on file    Non-medical: Not on file  Tobacco Use  . Smoking status: Never Smoker  . Smokeless tobacco: Never Used  Substance and Sexual Activity  . Alcohol use: No  . Drug use: No  . Sexual activity: Never  Lifestyle  . Physical activity:    Days per week: Not on file    Minutes per session: Not on file  . Stress: Not on file  Relationships  . Social connections:    Talks on phone: Not on file    Gets together: Not on file    Attends religious service: Not on file    Active member of club or organization: Not on file    Attends meetings of clubs or organizations: Not on file    Relationship status: Not on file  . Intimate partner violence:    Fear of current or ex partner: Not on file    Emotionally abused: Not on file    Physically abused: Not on file    Forced sexual activity: Not  on file  Other Topics Concern  . Not on file  Social History Narrative  . Not on file   Family History  Problem Relation Age of Onset  . Hypertension Father   . Breast cancer Maternal Aunt   . Diabetes Maternal Aunt   . Breast cancer Paternal Aunt   . Breast cancer Maternal Grandmother   . Breast cancer Paternal Grandmother   . Diabetes Paternal Grandmother      OBJECTIVE:  Vitals:   01/25/18 1600  BP: 120/72  Pulse: (!) 104  Resp: 16  Temp: 98.2 F (36.8 C)  TempSrc: Oral  SpO2: 98%  Rechecked pulse 93 bpm    General appearance: AOx3 in no acute distress HEENT: NCAT.  Oropharynx clear.  Lungs: clear to auscultation bilaterally without adventitious breath sounds Heart: regular rate and rhythm.  Radial pulses  2+ symmetrical bilaterally Abdomen: soft, non-distended; normal active bowel sounds; non-tender to light and deep palpation; nontender at McBurney's point; negative Murphy's sign; no guarding Extremities: no edema; symmetrical with no gross deformities Skin: warm and dry; 2 x 2 cm area of induration localized to left axilla; tender to palpation; no active drainage or areas of fluctuance; nonerythematous  Neurologic: normal gait Psychological: alert and cooperative; anxious mood and affect    ASSESSMENT & PLAN:  1. Non-intractable vomiting with nausea, unspecified vomiting type   2. Sebaceous cyst of left axilla   3. Nasal congestion     Meds ordered this encounter  Medications  . ondansetron (ZOFRAN) 4 MG tablet    Sig: Take 1 tablet (4 mg total) by mouth every 6 (six) hours.    Dispense:  12 tablet    Refill:  0    Order Specific Question:   Supervising Provider    Answer:   Wynona Luna [phone]  . fluticasone (FLONASE) 50 MCG/ACT nasal spray    Sig: Place 1 spray into both nostrils daily.    Dispense:  9.9 g    Refill:  2    Order Specific Question:   Supervising Provider    Answer:   Wynona Luna [[id number]]  . cetirizine-pseudoephedrine (ZYRTEC-D) 5-120 MG tablet    Sig: Take 1 tablet by mouth daily.    Dispense:  30 tablet    Refill:  0    Order Specific Question:   Supervising Provider    Answer:   Wynona Luna [phone]  . doxycycline (VIBRAMYCIN) 100 MG capsule    Sig: Take 1 capsule (100 mg total) by mouth 2 (two) times daily.    Dispense:  20 capsule    Refill:  0    Order Specific Question:   Supervising Provider    Answer:   Wynona Luna [[id number]]   Zofran prescribed.  Take as directed to nausea Avoid fatty, greasy, and dairy foods, as this may make your nausea worse.   Flonase prescribed.  Use as directed for nasal congestion Zyrtec D prescribed.  Use as directed for nasal congestion Doxycycline prescribed for cyst.  Take as  directed and follow up with PCP regarding further evaluation and management of chronic cysts Follow up with PCP if symptoms of nausea, vomiting, and/or nasal congestion persists Return or go to the ED if you have any new or worsening symptoms  Reviewed expectations re: course of current medical issues. Questions answered. Outlined signs and symptoms indicating need for more acute intervention. Patient verbalized understanding. After Visit Summary given.   Lestine Box, PA-C 01/25/18 1738

## 2018-01-25 NOTE — Discharge Instructions (Addendum)
Zofran prescribed.  Take as directed to nausea Avoid fatty, greasy, and dairy foods, as this may make your nausea worse.   Flonase prescribed.  Use as directed for nasal congestion Zyrtec D prescribed.  Use as directed for nasal congestion Doxycycline prescribed for cyst.  Take as directed and follow up with PCP regarding further evaluation and management of chronic cysts Follow up with PCP if symptoms of nausea, vomiting, and/or nasal congestion persists Return or go to the ED if you have any new or worsening symptoms

## 2018-01-25 NOTE — ED Triage Notes (Signed)
Pt reports a lump under her left axilla, bumps on her head at her hairline, sensitivity to certain smells with associated vomiting of brown and bloody phlegm, and fatigue.

## 2018-01-26 ENCOUNTER — Encounter (HOSPITAL_COMMUNITY): Payer: Self-pay

## 2018-01-26 ENCOUNTER — Inpatient Hospital Stay (HOSPITAL_COMMUNITY)
Admission: EM | Admit: 2018-01-26 | Discharge: 2018-01-29 | DRG: 054 | Disposition: A | Discharge status: Home / Self Care | Payer: Medicaid Other | Attending: Family Medicine | Admitting: Family Medicine

## 2018-01-26 ENCOUNTER — Ambulatory Visit
Admission: RE | Admit: 2018-01-26 | Discharge: 2018-01-26 | Disposition: A | Discharge status: Home / Self Care | Payer: No Typology Code available for payment source | Source: Ambulatory Visit | Attending: Obstetrics and Gynecology | Admitting: Obstetrics and Gynecology

## 2018-01-26 ENCOUNTER — Ambulatory Visit (HOSPITAL_COMMUNITY)
Admission: RE | Admit: 2018-01-26 | Discharge: 2018-01-26 | Disposition: A | Discharge status: Home / Self Care | Payer: Self-pay | Source: Ambulatory Visit | Attending: Obstetrics and Gynecology | Admitting: Obstetrics and Gynecology

## 2018-01-26 ENCOUNTER — Other Ambulatory Visit: Payer: Self-pay

## 2018-01-26 ENCOUNTER — Emergency Department (HOSPITAL_COMMUNITY): Payer: Medicaid Other

## 2018-01-26 ENCOUNTER — Inpatient Hospital Stay (HOSPITAL_COMMUNITY): Payer: Medicaid Other

## 2018-01-26 VITALS — BP 144/80 | Ht 61.0 in | Wt 162.0 lb

## 2018-01-26 DIAGNOSIS — E1165 Type 2 diabetes mellitus with hyperglycemia: Secondary | ICD-10-CM | POA: Diagnosis present

## 2018-01-26 DIAGNOSIS — Z79899 Other long term (current) drug therapy: Secondary | ICD-10-CM | POA: Diagnosis not present

## 2018-01-26 DIAGNOSIS — Z803 Family history of malignant neoplasm of breast: Secondary | ICD-10-CM | POA: Diagnosis not present

## 2018-01-26 DIAGNOSIS — C7971 Secondary malignant neoplasm of right adrenal gland: Secondary | ICD-10-CM | POA: Diagnosis present

## 2018-01-26 DIAGNOSIS — C799 Secondary malignant neoplasm of unspecified site: Secondary | ICD-10-CM | POA: Diagnosis not present

## 2018-01-26 DIAGNOSIS — C7931 Secondary malignant neoplasm of brain: Principal | ICD-10-CM | POA: Diagnosis present

## 2018-01-26 DIAGNOSIS — C786 Secondary malignant neoplasm of retroperitoneum and peritoneum: Secondary | ICD-10-CM | POA: Diagnosis present

## 2018-01-26 DIAGNOSIS — C7800 Secondary malignant neoplasm of unspecified lung: Secondary | ICD-10-CM | POA: Diagnosis present

## 2018-01-26 DIAGNOSIS — G936 Cerebral edema: Secondary | ICD-10-CM | POA: Diagnosis present

## 2018-01-26 DIAGNOSIS — C801 Malignant (primary) neoplasm, unspecified: Secondary | ICD-10-CM | POA: Diagnosis not present

## 2018-01-26 DIAGNOSIS — D259 Leiomyoma of uterus, unspecified: Secondary | ICD-10-CM | POA: Diagnosis present

## 2018-01-26 DIAGNOSIS — C439 Malignant melanoma of skin, unspecified: Secondary | ICD-10-CM | POA: Diagnosis present

## 2018-01-26 DIAGNOSIS — E869 Volume depletion, unspecified: Secondary | ICD-10-CM | POA: Diagnosis present

## 2018-01-26 DIAGNOSIS — Z808 Family history of malignant neoplasm of other organs or systems: Secondary | ICD-10-CM

## 2018-01-26 DIAGNOSIS — C773 Secondary and unspecified malignant neoplasm of axilla and upper limb lymph nodes: Secondary | ICD-10-CM | POA: Diagnosis present

## 2018-01-26 DIAGNOSIS — D62 Acute posthemorrhagic anemia: Secondary | ICD-10-CM | POA: Diagnosis present

## 2018-01-26 DIAGNOSIS — N926 Irregular menstruation, unspecified: Secondary | ICD-10-CM | POA: Diagnosis present

## 2018-01-26 DIAGNOSIS — K922 Gastrointestinal hemorrhage, unspecified: Secondary | ICD-10-CM | POA: Diagnosis not present

## 2018-01-26 DIAGNOSIS — Z1239 Encounter for other screening for malignant neoplasm of breast: Secondary | ICD-10-CM

## 2018-01-26 DIAGNOSIS — C7972 Secondary malignant neoplasm of left adrenal gland: Secondary | ICD-10-CM | POA: Diagnosis present

## 2018-01-26 DIAGNOSIS — K920 Hematemesis: Secondary | ICD-10-CM | POA: Diagnosis present

## 2018-01-26 DIAGNOSIS — K921 Melena: Secondary | ICD-10-CM | POA: Diagnosis present

## 2018-01-26 DIAGNOSIS — Z1231 Encounter for screening mammogram for malignant neoplasm of breast: Secondary | ICD-10-CM

## 2018-01-26 LAB — COMPREHENSIVE METABOLIC PANEL
ALBUMIN: 3.8 g/dL (ref 3.5–5.0)
ALK PHOS: 79 U/L (ref 38–126)
ALT: 14 U/L (ref 0–44)
AST: 26 U/L (ref 15–41)
Anion gap: 8 (ref 5–15)
BUN: 19 mg/dL (ref 6–20)
CALCIUM: 9.2 mg/dL (ref 8.9–10.3)
CO2: 25 mmol/L (ref 22–32)
CREATININE: 0.77 mg/dL (ref 0.44–1.00)
Chloride: 106 mmol/L (ref 98–111)
GFR calc Af Amer: >60 mL/min (ref >60)
GFR calc non Af Amer: >60 mL/min (ref >60)
GLUCOSE: 145 mg/dL — AB (ref 70–99)
Potassium: 4.1 mmol/L (ref 3.5–5.1)
SODIUM: 139 mmol/L (ref 135–145)
Total Bilirubin: 0.6 mg/dL (ref 0.3–1.2)
Total Protein: 7.1 g/dL (ref 6.5–8.1)

## 2018-01-26 LAB — I-STAT BETA HCG BLOOD, ED (MC, WL, AP ONLY)

## 2018-01-26 LAB — TYPE AND SCREEN
ABO/RH(D): A POS
Antibody Screen: NEGATIVE

## 2018-01-26 LAB — CBC
HCT: 33.3 % — ABNORMAL LOW (ref 36.0–46.0)
Hemoglobin: 9.8 g/dL — ABNORMAL LOW (ref 12.0–15.0)
MCH: 24.6 pg — AB (ref 26.0–34.0)
MCHC: 29.4 g/dL — AB (ref 30.0–36.0)
MCV: 83.5 fL (ref 78.0–100.0)
PLATELETS: 131 10*3/uL — AB (ref 150–400)
RBC: 3.99 MIL/uL (ref 3.87–5.11)
RDW: 14.4 % (ref 11.5–15.5)
WBC: 7.6 10*3/uL (ref 4.0–10.5)

## 2018-01-26 LAB — GLUCOSE, CAPILLARY: Glucose-Capillary: 158 mg/dL — ABNORMAL HIGH (ref 70–99)

## 2018-01-26 LAB — POC OCCULT BLOOD, ED: Fecal Occult Bld: POSITIVE — AB

## 2018-01-26 LAB — ABO/RH: ABO/RH(D): A POS

## 2018-01-26 MED ORDER — DEXAMETHASONE SODIUM PHOSPHATE 4 MG/ML IJ SOLN
4.0000 mg | Freq: Four times a day (QID) | INTRAMUSCULAR | Status: DC
  Administered 2018-01-26 – 2018-01-29 (×10): 4 mg via INTRAVENOUS
  Filled 2018-01-26 (×10): qty 1

## 2018-01-26 MED ORDER — GADOBENATE DIMEGLUMINE 529 MG/ML IV SOLN
16.0000 mL | Freq: Once | INTRAVENOUS | Status: AC | PRN
  Administered 2018-01-26: 16 mL via INTRAVENOUS

## 2018-01-26 MED ORDER — DEXAMETHASONE SODIUM PHOSPHATE 10 MG/ML IJ SOLN
10.0000 mg | Freq: Once | INTRAMUSCULAR | Status: AC
  Administered 2018-01-26: 10 mg via INTRAVENOUS
  Filled 2018-01-26: qty 1

## 2018-01-26 MED ORDER — FAMOTIDINE IN NACL 20-0.9 MG/50ML-% IV SOLN: 20.0000 mg | Freq: Two times a day (BID) | INTRAVENOUS | Status: DC

## 2018-01-26 MED ORDER — PANTOPRAZOLE SODIUM 40 MG IV SOLR
40.0000 mg | Freq: Two times a day (BID) | INTRAVENOUS | Status: DC
  Administered 2018-01-27 – 2018-01-29 (×5): 40 mg via INTRAVENOUS
  Filled 2018-01-26 (×6): qty 40

## 2018-01-26 MED ORDER — PANTOPRAZOLE SODIUM 40 MG IV SOLR
40.0000 mg | Freq: Two times a day (BID) | INTRAVENOUS | Status: DC
  Administered 2018-01-26: 40 mg via INTRAVENOUS
  Filled 2018-01-26: qty 40

## 2018-01-26 MED ORDER — LEVETIRACETAM IN NACL 500 MG/100ML IV SOLN
500.0000 mg | Freq: Once | INTRAVENOUS | Status: AC
  Administered 2018-01-26: 500 mg via INTRAVENOUS
  Filled 2018-01-26: qty 100

## 2018-01-26 MED ORDER — SODIUM CHLORIDE 0.9 % IV BOLUS
1000.0000 mL | Freq: Once | INTRAVENOUS | Status: AC
  Administered 2018-01-26: 1000 mL via INTRAVENOUS

## 2018-01-26 MED ORDER — LORATADINE 10 MG PO TABS
10.0000 mg | ORAL_TABLET | Freq: Every day | ORAL | Status: DC
  Administered 2018-01-26 – 2018-01-29 (×4): 10 mg via ORAL
  Filled 2018-01-26 (×4): qty 1

## 2018-01-26 MED ORDER — STERILE WATER FOR INJECTION IJ SOLN
INTRAMUSCULAR | Status: AC
  Filled 2018-01-26: qty 10

## 2018-01-26 MED ORDER — LEVETIRACETAM 500 MG PO TABS
500.0000 mg | ORAL_TABLET | Freq: Once | ORAL | Status: AC
  Administered 2018-01-26: 500 mg via ORAL
  Filled 2018-01-26: qty 1

## 2018-01-26 MED ORDER — ADULT MULTIVITAMIN W/MINERALS CH
1.0000 | ORAL_TABLET | Freq: Every day | ORAL | Status: DC
  Administered 2018-01-26 – 2018-01-29 (×4): 1 via ORAL
  Filled 2018-01-26 (×4): qty 1

## 2018-01-26 MED ORDER — IOHEXOL 300 MG/ML  SOLN
100.0000 mL | Freq: Once | INTRAMUSCULAR | Status: AC | PRN
  Administered 2018-01-26: 100 mL via INTRAVENOUS

## 2018-01-26 MED ORDER — LEVETIRACETAM IN NACL 1000 MG/100ML IV SOLN
1000.0000 mg | Freq: Two times a day (BID) | INTRAVENOUS | Status: DC
  Administered 2018-01-27 – 2018-01-28 (×4): 1000 mg via INTRAVENOUS
  Filled 2018-01-26 (×5): qty 100

## 2018-01-26 MED ORDER — SODIUM CHLORIDE 0.9 % IV SOLN
80.0000 mg | Freq: Once | INTRAVENOUS | Status: DC
  Filled 2018-01-26: qty 80

## 2018-01-26 MED ORDER — GI COCKTAIL ~~LOC~~
30.0000 mL | Freq: Once | ORAL | Status: AC
  Administered 2018-01-26: 30 mL via ORAL
  Filled 2018-01-26: qty 30

## 2018-01-26 NOTE — ED Provider Notes (Addendum)
I saw and evaluated the patient, reviewed the resident's note and I agree with the findings and plan. I was present and provided direct supervision for all procedures. Please see associated encounter note. Briefly, the patient is a 47 yo F here with multiple complaints. Unfortunately, imaging is concerning for metastatic melanoma. Suspect this is contributing to her multiple sx, including possibility of GI mets. Neurology, Oncology consulted. Will need GI consult as well as inpatient. Per Dr. Benay Spice, admit to [facility], obtain CT C/A/P.   EKG Interpretation None         Duffy Bruce, MD 01/26/18 1740

## 2018-01-26 NOTE — Patient Instructions (Signed)
Explained breast self awareness with [name]. Patient did not need a Pap smear today due to last Pap smear was in April 2019 per patient. Let her know [facility] will cover Pap smears every 3 years unless has a history of abnormal Pap smears. Referred patient to the [facility] for a screening mammogram. Appointment scheduled for Thursday, January 26, 2018 at 1310. Let patient know will follow up with her within the next couple weeks with results. [name] verbalized understanding.  Brannock, Arvil Chaco, RN 11:17 AM

## 2018-01-26 NOTE — Progress Notes (Signed)
Patient complained of periodic right breast pain over the past three months that she rates at a 5 out of 10. Patient states it only happens occasionally and denies any pain today.  Pap Smear: Pap smear not completed today. Last Pap smear was in April 2019 at the [facility] and normal per patient. Per patient has no history of an abnormal Pap smear. Last Pap smear result is not in Epic.  Physical exam: Breasts Breasts symmetrical. No skin abnormalities bilateral breasts. No nipple retraction bilateral breasts. No nipple discharge bilateral breasts. No lymphadenopathy. No lumps palpated bilateral breasts. No complaints of pain or tenderness on exam. Referred patient to the [facility] for a screening mammogram. Appointment scheduled for Thursday, January 26, 2018 at 1310.        Pelvic/Bimanual No Pap smear completed today since last Pap smear was in April 2019 per patient. Pap smear not indicated per [facility] guidelines.   Smoking History: Patient has never smoked.  Patient Navigation: Patient education provided. Access to services provided for patient through [facility] program.   Breast and Cervical Cancer Risk Assessment: Patient has no family history of a maternal grandmother, maternal aunt, paternal grandmother, and paternal aunt having breast cancer. Patient has no known genetic mutations or history of radiation treatment to the chest before age 61. Patient has no history of cervical dysplasia, immunocompromised, or DES exposure in-utero.  Risk Assessment    Risk Scores      01/26/2018   Last edited by: Loletta Parish, RN   5-year risk: 1.3 %   Lifetime risk: 11.8 %

## 2018-01-26 NOTE — ED Notes (Signed)
Pt returned from MRI at this time

## 2018-01-26 NOTE — ED Notes (Signed)
This RN attempted report at this time.  Receiving RN in shift report and to call back.

## 2018-01-26 NOTE — ED Notes (Signed)
Pt taken to CT.

## 2018-01-26 NOTE — ED Provider Notes (Signed)
[facility] EMERGENCY DEPARTMENT Provider Note   CSN: [id number] Arrival date & time: 01/26/18  1204     History   Chief Complaint Chief Complaint  Patient presents with  . Hematemesis  . Emesis    HPI Kaitlyn Duncan is a 47 y.o. female with a medical history of DM and anemia who presents with hematemesis and melena. She reports that she has had nausea, increased sensitivity to certain smells, and a weird taste in her mouth for the past week. She has had vomiting the last two days. She was seen in urgent care yesterday and prescribed zofran for nausea, but she has not picked up this prescription yet. Last night, she began to have vomiting that she describes as liquid with dark red blood mixed in. Her last BM was this morning. She endorses melena for the past week. She denies headaches, dizziness, changes in vision, abdominal pain, weight changes, NSAID use, alcohol use, or blood thinners. She denies sick contacts. She states that the episodes of nausea occur most often in the morning after she wakes up. She has decreased appetite. She has never had a colonoscopy and has no family history of colon cancer. She does not take iron supplementation. Kaitlyn Duncan also reports multiple bumps that have popped up on her left axilla, left neck, and left side of scalp. A few of the lesions are painful, but they are not pruritic or draining. She is currently on a course of doxycycline for the painful left axillary lump.   Past Medical History:  Diagnosis Date  . Anemia   . Anemia   . Diabetes mellitus without complication (HCC)    prediabetic  . Seasonal allergies     Patient Active Problem List   Diagnosis Date Noted  . Brain metastasis ([place]) 01/26/2018    Past Surgical History:  Procedure Laterality Date  . MOUTH SURGERY    . OVARY SURGERY     removed "something"     OB History    Gravida  0   Para  0   Term  0   Preterm  0   AB  0   Living  0     SAB  0   TAB  0   Ectopic  0   Multiple  0   Live Births  0            Home Medications    Prior to Admission medications   Medication Sig Start Date End Date Taking? Authorizing Provider  acetaminophen (TYLENOL) 325 MG tablet Take 650 mg by mouth every 6 (six) hours as needed.   Yes [provider]  ibuprofen (ADVIL,MOTRIN) 100 MG tablet Take 200 mg by mouth every 6 (six) hours as needed for pain or fever.    Yes [provider]  loratadine (CLARITIN) 10 MG tablet Take 10 mg by mouth daily.   Yes [provider]  Multiple Vitamin (MULTIVITAMIN WITH MINERALS) TABS tablet Take 1 tablet by mouth daily.   Yes [provider]  cetirizine-pseudoephedrine (ZYRTEC-D) 5-120 MG tablet Take 1 tablet by mouth daily. Patient not taking: Reported on 01/26/2018 01/25/18   Stacey Drain, [place], PA-C  doxycycline (VIBRAMYCIN) 100 MG capsule Take 1 capsule (100 mg total) by mouth 2 (two) times daily. Patient not taking: Reported on 01/26/2018 01/25/18   Wurst, [place], PA-C  fluticasone [facility]) 50 MCG/ACT nasal spray Place 1 spray into both nostrils daily. Patient not taking: Reported on 01/26/2018 01/25/18   Wurst,  [place], PA-C  ondansetron (ZOFRAN) 4 MG tablet Take 1 tablet (4 mg total) by mouth every 6 (six) hours. Patient not taking: Reported on 01/26/2018 01/25/18   Lestine Box, PA-C    Family History Family History  Problem Relation Age of Onset  . Hypertension Father   . Breast cancer Maternal Aunt   . Diabetes Maternal Aunt   . Breast cancer Paternal Aunt   . Breast cancer Maternal Grandmother   . Breast cancer Paternal Grandmother   . Diabetes Paternal Grandmother     Social History Social History   Tobacco Use  . Smoking status: Never Smoker  . Smokeless tobacco: Never Used  Substance Use Topics  . Alcohol use: No  . Drug use: No     Allergies   Patient has no known allergies.   Review of Systems Review of Systems    Constitutional: Positive for fatigue. Negative for chills and fever.  HENT: Positive for sore throat. Negative for rhinorrhea.   Eyes: Negative for visual disturbance.  Respiratory: Negative for cough and shortness of breath.   Cardiovascular: Negative for chest pain and leg swelling.  Gastrointestinal: Positive for blood in stool, nausea and vomiting. Negative for abdominal distention, abdominal pain, constipation and diarrhea.  Genitourinary: Negative for difficulty urinating, dysuria and flank pain.  Musculoskeletal: Negative for back pain and neck pain.  Skin: Positive for rash. Negative for wound.  Neurological: Negative for dizziness, weakness and headaches.     Physical Exam Updated Vital Signs BP (!) 119/94   Pulse 98   Temp 98.3 F (36.8 C) (Oral)   Resp 18   Ht 5\' 1"  (1.549 m)   Wt 73.5 kg (162 lb)   LMP 01/15/2018 (Exact Date)   SpO2 98%   BMI 30.61 kg/m   Physical Exam  Constitutional: She is oriented to person, place, and time. She appears well-developed and well-nourished. No distress.  HENT:  Head: Normocephalic and atraumatic.  Eyes: Pupils are equal, round, and reactive to light. EOM are normal.  Neck: Normal range of motion. Neck supple.  Cardiovascular: Normal rate and regular rhythm. Exam reveals no gallop and no friction rub.  No murmur heard. Pulmonary/Chest: Effort normal and breath sounds normal. She has no wheezes. She has no rales. She exhibits no tenderness.  Abdominal: Soft. Bowel sounds are normal. She exhibits no distension and no mass. There is no tenderness. There is no guarding.  Genitourinary: Rectal exam shows guaiac positive stool.  Genitourinary Comments: Sample collected with chaperone nurse tech in the room.  Musculoskeletal: She exhibits no edema or deformity.  Neurological: She is alert and oriented to person, place, and time.  Skin: Skin is warm and dry. Capillary refill takes less than 2 seconds.  Multiple soft, skin-colored  nodules on left scalp, left cervical chain, and left axilla. No fluctuance or drainage.   Psychiatric: She has a normal mood and affect. Her behavior is normal.     ED Treatments / Results  Labs (all labs ordered are listed, but only abnormal results are displayed) Labs Reviewed  COMPREHENSIVE METABOLIC PANEL - Abnormal; Notable for the following components:      Result Value   Glucose, Bld 145 (*)    All other components within normal limits  CBC - Abnormal; Notable for the following components:   Hemoglobin 9.8 (*)    HCT 33.3 (*)    MCH 24.6 (*)    MCHC 29.4 (*)    Platelets 131 (*)    All other  components within normal limits  POC OCCULT BLOOD, ED - Abnormal; Notable for the following components:   Fecal Occult Bld POSITIVE (*)    All other components within normal limits  I-STAT BETA HCG BLOOD, ED ([facility], [facility], [facility] ONLY)  TYPE AND SCREEN  ABO/RH    EKG None  Radiology Ct Head Wo Contrast  Result Date: 01/26/2018 CLINICAL DATA:  Nausea and vomiting for 1 week EXAM: CT HEAD WITHOUT CONTRAST TECHNIQUE: Contiguous axial images were obtained from the base of the skull through the vertex without intravenous contrast. COMPARISON:  None. FINDINGS: Brain: There is an ill-defined hyperdense focus in the medial right parietal lobe measuring approximately 1 cm. There is no surrounding vasogenic edema. There is no mass effect or midline shift. Ventricular system and extra-axial space are within normal limits. Vascular: No hyperdense vessel or unexpected calcification. Skull: The cranium is intact. Sinuses/Orbits: There is opacification of the left maxillary sinus with postoperative changes. Small mucous retention cyst in the right maxillary sinus. Nasal septum is deviated to the left. Orbits are within normal limits. Other: Noncontributory IMPRESSION: There is a hyperdense focus in the right parietal lobe. Differential diagnosis includes a hyperdense mass, cavernous angioma, or subacute  hemorrhage. Initially, CT head with contrast may be helpful. Ultimately, brain MRI may be required. Electronically Signed   By: Marybelle Killings M.D.   On: 01/26/2018 14:10   Mr [name] IR Contrast  Result Date: 01/26/2018 CLINICAL DATA:  Abnormal head CT. Nausea and vomiting and anemia. Headaches EXAM: MRI HEAD WITHOUT AND WITH CONTRAST TECHNIQUE: Multiplanar, multiecho pulse sequences of the brain and surrounding structures were obtained without and with intravenous contrast. CONTRAST:  45mL MULTIHANCE GADOBENATE DIMEGLUMINE 529 MG/ML IV SOLN COMPARISON:  CT head 01/26/2018 FINDINGS: Brain: Widespread small hemorrhagic lesions are present in the brain. Most these are very small and contain intrinsic T1 shortening due to methemoglobin or melanin. Many lesions also show enhancement suggesting underlying tumor. Largest areas of hemorrhage include the right occipital lobe measuring approximately 12 mm and right parietal lobe measuring 9 mm. Mild edema around the right parietal lesion which also shows enhancement. Approximately 17 lesions are identified in the brain. These are marked with arrows on the axial T1 post-contrast sequence. Ventricle size normal. No midline shift. No areas of acute infarction. No significant chronic ischemia. Vascular: Normal arterial flow voids Skull and upper cervical spine: No lesions in the calvarium identified. However, there are numerous small dermal lesions throughout the scalp and upper neck bilaterally. Many of these show enhancement and are suspicious for tumor deposits. Sinuses/Orbits: Mucosal edema throughout the paranasal sinuses. Normal orbit Other: None IMPRESSION: Numerous small hemorrhagic lesions throughout the brain many of which show enhancement. Largest lesion in the right parietal lobe shows mild surrounding edema. Findings are highly suspicious for hemorrhagic metastatic disease such as melanoma. In addition, there are numerous enhancing lesions throughout the scalp  and skin in the upper neck also suspicious for widespread tumor. These results were called by telephone at the time of interpretation on 01/26/2018 at 4:38 pm to Dr. Duffy Bruce , who verbally acknowledged these results. Electronically Signed   By: Franchot Gallo M.D.   On: 01/26/2018 16:39   Dg Abd 2 Views  Result Date: 01/26/2018 CLINICAL DATA:  Nausea and vomiting. EXAM: ABDOMEN - 2 VIEW COMPARISON:  None. FINDINGS: The bowel gas pattern is normal. There is no evidence of free air. No radio-opaque calculi or other significant radiographic abnormality is seen. Probable bone island in  the right sacral ala. No acute osseous abnormality. IMPRESSION: Negative. Electronically Signed   By: Titus Dubin M.D.   On: 01/26/2018 14:01    Procedures Procedures (including critical care time)  Medications Ordered in ED Medications  pantoprazole (PROTONIX) injection 40 mg (40 mg Intravenous Given 01/26/18 1808)  sterile water (preservative free) injection (has no administration in time range)  sodium chloride 0.9 % bolus 1,000 mL (1,000 mLs Intravenous New Bag/Given 01/26/18 1637)  gi cocktail (Maalox,Lidocaine,Donnatal) (30 mLs Oral Given 01/26/18 1637)  gadobenate dimeglumine (MULTIHANCE) injection 16 mL (16 mLs Intravenous Contrast Given 01/26/18 1616)  dexamethasone (DECADRON) injection 10 mg (10 mg Intravenous Given 01/26/18 1808)  levETIRAcetam (KEPPRA) tablet 500 mg (500 mg Oral Given 01/26/18 1808)     Initial Impression / Assessment and Plan / ED Course  I have reviewed the triage vital signs and the nursing notes.  Pertinent labs & imaging results that were available during my care of the patient were reviewed by me and considered in my medical decision making (see chart for details).  Afrah Burlison is a 47 y.o. female with a medical history of DM and anemia who presents with hematemesis and melena. Upon arrival to the ED, patient is afebrile, hemodynamically stable, and is not  actively vomiting. Physical exam is negative for subconjunctival pallor or abdominal pain, but shows positive heme occult stool and multiple subcutaneous nodules. DDx includes bleeding peptic ulcer (although she has no risk factors of alcohol use or NSAID use), GERD, or bronchitis with hemoptysis rather than true emesis. There is also some concern for an intracranial mass given her report of morning nausea/emesis, sensitivity to smells, and abnormal tastes in her mouth, and thus will pursue head imaging. Axillary and cervical skin nodules may be lymphadenopathy, but this is less likely given that her scalp is covered in similar nodules.   Labs showed negative hcg and Hb 9.8 (last Hb was 12.9 in 2017). Type and screen performed. Abdominal xray showed no free air. Head CT showed a hyperdense focus in the right parietal lobe, and radiology recommended brain MRI to better characterize the lesion. Brain MRI showed numerous small hemorrhagic lesions throughout the brain with the largest lesion in the right parietal lobe with mild surrounding edema. MRI also showed numerous enhancing lesions throughout the scalp and skin the upper neck. Her hematemesis is likely due to metastases to the GI tract. Her skin nodules are likely cutaneous metastases. These findings are suspicious for hemorrhagic metastatic disease such as melanoma. Pt received 1L NS, 10mg  IV Decadron for cerebral edema, 500 mg PO Keppra for seizure prophylaxis, and a GI cocktail. Case discussed with oncology who recommend admission to [facility] for CT chest/abdomen/pelvis and possible initiation of whole brain radiation. Case discussed with neurology who agree with Keppra 500mg  BID for seizure prophylaxis. Patient counseled about the findings on her MRI and hospital admission.   Final Clinical Impressions(s) / ED Diagnoses   Final diagnoses:  Hematemesis  Brain metastases [facility])    ED Discharge Orders    None       Shaquella Stamant, Andree Elk,  MD 01/26/18 1851    Duffy Bruce, MD 01/26/18 2300

## 2018-01-26 NOTE — ED Notes (Signed)
Ready for transport to MRI

## 2018-01-26 NOTE — ED Notes (Addendum)
Pt transported via [facility] at this time

## 2018-01-26 NOTE — H&P (Signed)
History and Physical  Kaitlyn Duncan ERX:[id number] DOB: 05/07/1971 DOA: 01/26/2018  Referring physician: ER physician PCP: Marliss Coots, NP  Outpatient Specialists:    Patient coming from: Home  Chief Complaint: Coffee-ground emesis  HPI:  Patient is a 47 year old Caucasian female with past medical history significant for diabetes mellitus, and anemia, seasonal allergies, and prior history of biopsy-proven malignant mole on right upper back around 2014.  Apparently, the patient could not tell me if she followed up after she was informed that the biopsy of the mole was malignant.  Patient has some medication as includes NSAIDs.  Patient was not forthcoming with history.  Patient presented with nausea, vomiting and coffee-ground emesis.  Stool occult blood came back positive.  Hemoglobin on presentation was 9.8 g/dL, with prior hemoglobin of 12.9 g/dL documented in August 2017.  No neck pain, no fever chills, no chest pain, no shortness of breath, no URI symptoms, no urinary symptoms and no joint problems.  CT scan of the head done revealed "hyperdense focus in the right parietal lobe".  MRI of the brain done revealed findings there with suspicious for metastatic melanoma to the brain.  Oncology team has been consulted, and patient will be admitted to [facility].  Patient will be started on seizure prophylaxis and IV Decadron.  Radiation oncology will be consulted at [facility].  Meanwhile, the ER physician has also consulted the GI team for the coffee-ground emesis.   ED Course: Patient has been volume resuscitated.  GI, oncology teams have been consulted.  Protonix has been given.  Hospitalist team has been called to admit patient Pertinent labs: Kindly see above. Imaging: independently reviewed.   Review of Systems:  As in the history of presenting illness.  12 systems have been reviewed.  Pertinent positives and negatives are as documented.  Negative for fever,  visual changes, sore throat, rash, new muscle aches, chest pain, SOB, dysuria.  Past Medical History:  Diagnosis Date  . Anemia   . Anemia   . Diabetes mellitus without complication (HCC)    prediabetic  . Seasonal allergies     Past Surgical History:  Procedure Laterality Date  . MOUTH SURGERY    . OVARY SURGERY     removed "something"     reports that she has never smoked. She has never used smokeless tobacco. She reports that she does not drink alcohol or use drugs.  No Known Allergies  Family History  Problem Relation Age of Onset  . Hypertension Father   . Breast cancer Maternal Aunt   . Diabetes Maternal Aunt   . Breast cancer Paternal Aunt   . Breast cancer Maternal Grandmother   . Breast cancer Paternal Grandmother   . Diabetes Paternal Grandmother      Prior to Admission medications   Medication Sig Start Date End Date Taking? Authorizing Provider  acetaminophen (TYLENOL) 325 MG tablet Take 650 mg by mouth every 6 (six) hours as needed.   Yes [provider]  ibuprofen (ADVIL,MOTRIN) 100 MG tablet Take 200 mg by mouth every 6 (six) hours as needed for pain or fever.    Yes [provider]  loratadine (CLARITIN) 10 MG tablet Take 10 mg by mouth daily.   Yes [provider]  Multiple Vitamin (MULTIVITAMIN WITH MINERALS) TABS tablet Take 1 tablet by mouth daily.   Yes [provider]    Physical Exam: Vitals:   01/26/18 1641 01/26/18 1645 01/26/18 1715 01/26/18 1845  BP:  129/86 132/78 (!) 119/94 126/90  Pulse: 95 94 98 96  Resp: 18   16  Temp:      TempSrc:      SpO2: 99% 98% 98% 97%  Weight:      Height:       Constitutional:  . Appears calm and comfortable Eyes:  Marland Kitchen Mild pallor.  No jaundice.  ENMT:  . external ears, nose appear normal Neck:  . Neck is supple. No JVD Respiratory:  . CTA bilaterally, no w/r/r.  . Respiratory effort normal. No retractions or accessory muscle use Cardiovascular:  . S1S2 . No  LE extremity edema   Abdomen:  . Abdomen is soft and non tender. Organs are difficult to assess. Neurologic:  . Awake and alert. . Moves all limbs.  Wt Readings from Last 3 Encounters:  01/26/18 73.5 kg (162 lb)  01/26/18 73.5 kg (162 lb)  04/29/16 72.6 kg (160 lb)    I have personally reviewed following labs and imaging studies  Labs on Admission:  CBC: Recent Labs  Lab 01/26/18 1227  WBC 7.6  HGB 9.8*  HCT 33.3*  MCV 83.5  PLT 409*   Basic Metabolic Panel: Recent Labs  Lab 01/26/18 1227  NA 139  K 4.1  CL 106  CO2 25  GLUCOSE 145*  BUN 19  CREATININE 0.77  CALCIUM 9.2   Liver Function Tests: Recent Labs  Lab 01/26/18 1227  AST 26  ALT 14  ALKPHOS 79  BILITOT 0.6  PROT 7.1  ALBUMIN 3.8   No results for input(s): LIPASE, AMYLASE in the last 168 hours. No results for input(s): AMMONIA in the last 168 hours. Coagulation Profile: No results for input(s): INR, PROTIME in the last 168 hours. Cardiac Enzymes: No results for input(s): CKTOTAL, CKMB, CKMBINDEX, TROPONINI in the last 168 hours. BNP (last 3 results) No results for input(s): PROBNP in the last 8760 hours. HbA1C: No results for input(s): HGBA1C in the last 72 hours. CBG: No results for input(s): GLUCAP in the last 168 hours. Lipid Profile: No results for input(s): CHOL, HDL, LDLCALC, TRIG, CHOLHDL, LDLDIRECT in the last 72 hours. Thyroid Function Tests: No results for input(s): TSH, T4TOTAL, FREET4, T3FREE, THYROIDAB in the last 72 hours. Anemia Panel: No results for input(s): VITAMINB12, FOLATE, FERRITIN, TIBC, IRON, RETICCTPCT in the last 72 hours. Urine analysis:    Component Value Date/Time   COLORURINE YELLOW 09/15/2009 0115   APPEARANCEUR CLEAR 09/15/2009 0115   LABSPEC 1.031 (H) 09/15/2009 0115   PHURINE 8.5 (H) 09/15/2009 0115   GLUCOSEU NEGATIVE 09/15/2009 0115   HGBUR NEGATIVE 09/15/2009 0115   BILIRUBINUR NEGATIVE 09/15/2009 0115   KETONESUR 40 (A) 09/15/2009 0115    PROTEINUR 30 (A) 09/15/2009 0115   UROBILINOGEN 0.2 09/15/2009 0115   NITRITE NEGATIVE 09/15/2009 0115   LEUKOCYTESUR NEGATIVE 09/15/2009 0115   Sepsis Labs: @LABRCNTIP (procalcitonin:4,lacticidven:4) )No results found for this or any previous visit (from the past 240 hour(s)).    Radiological Exams on Admission: Ct Head Wo Contrast  Result Date: 01/26/2018 CLINICAL DATA:  Nausea and vomiting for 1 week EXAM: CT HEAD WITHOUT CONTRAST TECHNIQUE: Contiguous axial images were obtained from the base of the skull through the vertex without intravenous contrast. COMPARISON:  None. FINDINGS: Brain: There is an ill-defined hyperdense focus in the medial right parietal lobe measuring approximately 1 cm. There is no surrounding vasogenic edema. There is no mass effect or midline shift. Ventricular system and extra-axial space are within normal limits. Vascular: No hyperdense vessel  or unexpected calcification. Skull: The cranium is intact. Sinuses/Orbits: There is opacification of the left maxillary sinus with postoperative changes. Small mucous retention cyst in the right maxillary sinus. Nasal septum is deviated to the left. Orbits are within normal limits. Other: Noncontributory IMPRESSION: There is a hyperdense focus in the right parietal lobe. Differential diagnosis includes a hyperdense mass, cavernous angioma, or subacute hemorrhage. Initially, CT head with contrast may be helpful. Ultimately, brain MRI may be required. Electronically Signed   By: Marybelle Killings M.D.   On: 01/26/2018 14:10   Mr [name] UE Contrast  Result Date: 01/26/2018 CLINICAL DATA:  Abnormal head CT. Nausea and vomiting and anemia. Headaches EXAM: MRI HEAD WITHOUT AND WITH CONTRAST TECHNIQUE: Multiplanar, multiecho pulse sequences of the brain and surrounding structures were obtained without and with intravenous contrast. CONTRAST:  25mL MULTIHANCE GADOBENATE DIMEGLUMINE 529 MG/ML IV SOLN COMPARISON:  CT head 01/26/2018 FINDINGS:  Brain: Widespread small hemorrhagic lesions are present in the brain. Most these are very small and contain intrinsic T1 shortening due to methemoglobin or melanin. Many lesions also show enhancement suggesting underlying tumor. Largest areas of hemorrhage include the right occipital lobe measuring approximately 12 mm and right parietal lobe measuring 9 mm. Mild edema around the right parietal lesion which also shows enhancement. Approximately 17 lesions are identified in the brain. These are marked with arrows on the axial T1 post-contrast sequence. Ventricle size normal. No midline shift. No areas of acute infarction. No significant chronic ischemia. Vascular: Normal arterial flow voids Skull and upper cervical spine: No lesions in the calvarium identified. However, there are numerous small dermal lesions throughout the scalp and upper neck bilaterally. Many of these show enhancement and are suspicious for tumor deposits. Sinuses/Orbits: Mucosal edema throughout the paranasal sinuses. Normal orbit Other: None IMPRESSION: Numerous small hemorrhagic lesions throughout the brain many of which show enhancement. Largest lesion in the right parietal lobe shows mild surrounding edema. Findings are highly suspicious for hemorrhagic metastatic disease such as melanoma. In addition, there are numerous enhancing lesions throughout the scalp and skin in the upper neck also suspicious for widespread tumor. These results were called by telephone at the time of interpretation on 01/26/2018 at 4:38 pm to Dr. Duffy Bruce , who verbally acknowledged these results. Electronically Signed   By: Franchot Gallo M.D.   On: 01/26/2018 16:39   Dg Abd 2 Views  Result Date: 01/26/2018 CLINICAL DATA:  Nausea and vomiting. EXAM: ABDOMEN - 2 VIEW COMPARISON:  None. FINDINGS: The bowel gas pattern is normal. There is no evidence of free air. No radio-opaque calculi or other significant radiographic abnormality is seen. Probable bone island  in the right sacral ala. No acute osseous abnormality. IMPRESSION: Negative. Electronically Signed   By: Titus Dubin M.D.   On: 01/26/2018 14:01      Active Problems:   Brain metastasis (HCC)   Assessment/Plan 1. Brain metastasis, possible from malignant melanoma. 2. Coffee-ground emesis/GI bleed. 3. Acute blood loss anemia. 4. Volume depletion 5. Mildly elevated glucose level 6. History of diabetes mellitus   We will admit patient to [facility] long hospital as per oncology team's recommendation  Consult radiation oncology   CT scan of the chest, abdomen and pelvis for staging  IV Solu-Medrol  IV Protonix 40 every 12 hours (will avoid IV fluids considering brain metastasis)  Monitor H/H daily atenolol  GI team has already been consulted  Type, crossmatch and hold  Monitor blood sugar closely  Neurology already consulted, and  IV Keppra 1000 mg every 12 advised.  Further management will depend on hospital course  DVT prophylaxis: SCD Code Status: Full Family Communication: None available Disposition Plan: Will depend on hospital course Consults called: Oncology, radiation oncology, GI, neurology Admission status: Inpatient  Time spent: 64 minutes.   Dana Allan, MD  [facility] Pager #: [phone] 7PM-7AM contact night coverage as above   01/26/2018, 7:02 PM

## 2018-01-26 NOTE — ED Triage Notes (Signed)
Pt endorses left sided abd pain, n/v x 1 week, began vomiting a "lot of blood" yesterday. VSS. Denies fever or chills.

## 2018-01-27 ENCOUNTER — Encounter (HOSPITAL_COMMUNITY): Payer: Self-pay | Admitting: *Deleted

## 2018-01-27 ENCOUNTER — Inpatient Hospital Stay (HOSPITAL_COMMUNITY): Payer: Medicaid Other

## 2018-01-27 ENCOUNTER — Ambulatory Visit
Admit: 2018-01-27 | Discharge: 2018-01-27 | Disposition: A | Discharge status: Home / Self Care | Payer: Self-pay | Attending: Radiation Oncology | Admitting: Radiation Oncology

## 2018-01-27 DIAGNOSIS — C799 Secondary malignant neoplasm of unspecified site: Secondary | ICD-10-CM

## 2018-01-27 DIAGNOSIS — C7931 Secondary malignant neoplasm of brain: Secondary | ICD-10-CM

## 2018-01-27 DIAGNOSIS — Z803 Family history of malignant neoplasm of breast: Secondary | ICD-10-CM

## 2018-01-27 DIAGNOSIS — K922 Gastrointestinal hemorrhage, unspecified: Secondary | ICD-10-CM

## 2018-01-27 DIAGNOSIS — C801 Malignant (primary) neoplasm, unspecified: Secondary | ICD-10-CM

## 2018-01-27 LAB — CBC
HCT: 30.3 % — ABNORMAL LOW (ref 36.0–46.0)
Hemoglobin: 9.5 g/dL — ABNORMAL LOW (ref 12.0–15.0)
MCH: 25.4 pg — ABNORMAL LOW (ref 26.0–34.0)
MCHC: 31.4 g/dL (ref 30.0–36.0)
MCV: 81 fL (ref 78.0–100.0)
Platelets: 133 10*3/uL — ABNORMAL LOW (ref 150–400)
RBC: 3.74 MIL/uL — ABNORMAL LOW (ref 3.87–5.11)
RDW: 14.5 % (ref 11.5–15.5)
WBC: 7 10*3/uL (ref 4.0–10.5)

## 2018-01-27 LAB — TYPE AND SCREEN
ABO/RH(D): A POS
Antibody Screen: NEGATIVE

## 2018-01-27 LAB — PROTIME-INR
INR: 1.01
Prothrombin Time: 13.2 seconds (ref 11.4–15.2)

## 2018-01-27 LAB — HEMOGLOBIN AND HEMATOCRIT, BLOOD
HCT: 31.5 % — ABNORMAL LOW (ref 36.0–46.0)
HCT: 30.2 % — ABNORMAL LOW (ref 36.0–46.0)
Hemoglobin: 9.8 g/dL — ABNORMAL LOW (ref 12.0–15.0)
Hemoglobin: 9.3 g/dL — ABNORMAL LOW (ref 12.0–15.0)

## 2018-01-27 LAB — BASIC METABOLIC PANEL
Anion gap: 8 (ref 5–15)
BUN: 20 mg/dL (ref 6–20)
CO2: 23 mmol/L (ref 22–32)
Calcium: 8.7 mg/dL — ABNORMAL LOW (ref 8.9–10.3)
Chloride: 106 mmol/L (ref 98–111)
Creatinine, Ser: 0.56 mg/dL (ref 0.44–1.00)
GFR calc Af Amer: >60 mL/min (ref >60)
GFR calc non Af Amer: >60 mL/min (ref >60)
Glucose, Bld: 144 mg/dL — ABNORMAL HIGH (ref 70–99)
Potassium: 4.1 mmol/L (ref 3.5–5.1)
Sodium: 137 mmol/L (ref 135–145)

## 2018-01-27 LAB — GLUCOSE, CAPILLARY
Glucose-Capillary: 144 mg/dL — ABNORMAL HIGH (ref 70–99)
Glucose-Capillary: 97 mg/dL (ref 70–99)
Glucose-Capillary: 230 mg/dL — ABNORMAL HIGH (ref 70–99)

## 2018-01-27 LAB — ABO/RH: ABO/RH(D): A POS

## 2018-01-27 MED ORDER — MIDAZOLAM HCL 2 MG/2ML IJ SOLN
INTRAMUSCULAR | Status: AC
  Filled 2018-01-27: qty 2

## 2018-01-27 MED ORDER — FENTANYL CITRATE (PF) 100 MCG/2ML IJ SOLN
INTRAMUSCULAR | Status: AC | PRN
  Administered 2018-01-27: 50 ug via INTRAVENOUS

## 2018-01-27 MED ORDER — MIDAZOLAM HCL 2 MG/2ML IJ SOLN
INTRAMUSCULAR | Status: AC | PRN
  Administered 2018-01-27 (×2): 1 mg via INTRAVENOUS

## 2018-01-27 MED ORDER — LIDOCAINE HCL 1 % IJ SOLN
INTRAMUSCULAR | Status: AC
  Filled 2018-01-27: qty 10

## 2018-01-27 MED ORDER — FENTANYL CITRATE (PF) 100 MCG/2ML IJ SOLN
INTRAMUSCULAR | Status: AC
  Filled 2018-01-27: qty 2

## 2018-01-27 MED ORDER — SODIUM CHLORIDE 0.9 % IV SOLN
INTRAVENOUS | Status: DC
  Administered 2018-01-27 – 2018-01-28 (×2): via INTRAVENOUS
  Administered 2018-01-28: 500 mL via INTRAVENOUS

## 2018-01-27 NOTE — Consult Note (Signed)
Chief Complaint: Patient was seen in consultation today for cutaneous nodules  Referring Physician(s): Dr. Teryl Lucy  Supervising Physician: Daryll Brod  Patient Status: [facility] - In-pt  History of Present Illness: Kaitlyn Duncan is a 47 y.o. female with past medical history of anemia, DM, and biopsy-proven malignant mole on right upper back in 2014 who presented to [facility] with coffee-ground emesis. No apparent follow-up occurred after her positive biopsy and she reports noticing recent growths/nodules in her skin.   A CT Abdomen/Pelvis was obtained due to possible GI bleed which showed a large number of metastases in the subcutaneous fat of the chest, abdomen, and pelvis as well as throughout the peritoneum and retroperitoneum.  An MR Brain showed right parietal lesion suspicious for hemorrhagic metastatic disease.  IR consulted for possible biopsy at the request of Dr. Teryl Lucy.  Case reviewed by Dr. Annamaria Boots who approves biopsy of her cutaneous skin nodules/lesions.   Patient is NPO.  She is not currently on blood thinners.   Past Medical History:  Diagnosis Date  . Anemia   . Anemia   . Diabetes mellitus without complication (HCC)    prediabetic  . Seasonal allergies     Past Surgical History:  Procedure Laterality Date  . MOUTH SURGERY    . OVARY SURGERY     removed "something"    Allergies: Patient has no known allergies.  Medications: Prior to Admission medications   Medication Sig Start Date End Date Taking? Authorizing Provider  acetaminophen (TYLENOL) 325 MG tablet Take 650 mg by mouth every 6 (six) hours as needed.   Yes [provider]  ibuprofen (ADVIL,MOTRIN) 100 MG tablet Take 200 mg by mouth every 6 (six) hours as needed for pain or fever.    Yes [provider]  loratadine (CLARITIN) 10 MG tablet Take 10 mg by mouth daily.   Yes [provider]  Multiple Vitamin (MULTIVITAMIN WITH MINERALS) TABS tablet Take 1 tablet by mouth daily.    Yes [provider]     Family History  Problem Relation Age of Onset  . Hypertension Father   . Breast cancer Maternal Aunt   . Diabetes Maternal Aunt   . Breast cancer Paternal Aunt   . Breast cancer Maternal Grandmother   . Breast cancer Paternal Grandmother   . Diabetes Paternal Grandmother     Social History   Socioeconomic History  . Marital status: Single    Spouse name: Not on file  . Number of children: Not on file  . Years of education: Not on file  . Highest education level: Not on file  Occupational History  . Not on file  Social Needs  . Financial resource strain: Not on file  . Food insecurity:    Worry: Not on file    Inability: Not on file  . Transportation needs:    Medical: Not on file    Non-medical: Not on file  Tobacco Use  . Smoking status: Never Smoker  . Smokeless tobacco: Never Used  Substance and Sexual Activity  . Alcohol use: No  . Drug use: No  . Sexual activity: Not Currently  Lifestyle  . Physical activity:    Days per week: Not on file    Minutes per session: Not on file  . Stress: Not on file  Relationships  . Social connections:    Talks on phone: Not on file    Gets together: Not on file    Attends religious service: Not on  file    Active member of club or organization: Not on file    Attends meetings of clubs or organizations: Not on file    Relationship status: Not on file  Other Topics Concern  . Not on file  Social History Narrative  . Not on file     Review of Systems: A 12 point ROS discussed and pertinent positives are indicated in the HPI above.  All other systems are negative.  Review of Systems  Constitutional: Negative for fatigue and fever.  Respiratory: Negative for cough and shortness of breath.   Cardiovascular: Negative for chest pain.  Gastrointestinal: Positive for abdominal pain (PTA), blood in stool, nausea (PTA) and vomiting (none since admission. ).  Skin:       Has noticed recent  "growths"  Psychiatric/Behavioral: Negative for behavioral problems and confusion.    Vital Signs: BP 119/72 (BP Location: Left Arm)   Pulse 85   Temp 98.6 F (37 C) (Oral)   Resp 16   Ht 5\' 1"  (1.549 m)   Wt 162 lb (73.5 kg)   LMP 01/15/2018 (Exact Date)   SpO2 95%   BMI 30.61 kg/m   Physical Exam  Constitutional: She is oriented to person, place, and time. She appears well-developed. No distress.  Cardiovascular: Normal rate, regular rhythm and normal heart sounds.  Pulmonary/Chest: Effort normal and breath sounds normal. No respiratory distress.  Abdominal: Soft. She exhibits no distension. There is no tenderness.  Neurological: She is alert and oriented to person, place, and time.  Skin: Skin is warm and dry. She is not diaphoretic.  Palpable L axillary growth/node. Corresponding lesions on the right seen by CT are not palpable on PE.   Psychiatric: She has a normal mood and affect. Her behavior is normal. Judgment and thought content normal.  Nursing note and vitals reviewed.   MD Evaluation Airway: WNL Heart: WNL Abdomen: WNL Chest/ Lungs: WNL ASA  Classification: 2 Mallampati/Airway Score: One   Imaging: Ct Head Wo Contrast  Result Date: 01/26/2018 CLINICAL DATA:  Nausea and vomiting for 1 week EXAM: CT HEAD WITHOUT CONTRAST TECHNIQUE: Contiguous axial images were obtained from the base of the skull through the vertex without intravenous contrast. COMPARISON:  None. FINDINGS: Brain: There is an ill-defined hyperdense focus in the medial right parietal lobe measuring approximately 1 cm. There is no surrounding vasogenic edema. There is no mass effect or midline shift. Ventricular system and extra-axial space are within normal limits. Vascular: No hyperdense vessel or unexpected calcification. Skull: The cranium is intact. Sinuses/Orbits: There is opacification of the left maxillary sinus with postoperative changes. Small mucous retention cyst in the right maxillary  sinus. Nasal septum is deviated to the left. Orbits are within normal limits. Other: Noncontributory IMPRESSION: There is a hyperdense focus in the right parietal lobe. Differential diagnosis includes a hyperdense mass, cavernous angioma, or subacute hemorrhage. Initially, CT head with contrast may be helpful. Ultimately, brain MRI may be required. Electronically Signed   By: Marybelle Killings M.D.   On: 01/26/2018 14:10   Ct Chest W Contrast  Result Date: 01/26/2018 CLINICAL DATA:  Left mid abdominal pain, nausea and vomiting for the past week. Hematemesis yesterday. Intermittent right breast pain for the past 3 months. Stage IV melanoma. EXAM: CT CHEST, ABDOMEN, AND PELVIS WITH CONTRAST TECHNIQUE: Multidetector CT imaging of the chest, abdomen and pelvis was performed following the standard protocol during bolus administration of intravenous contrast. CONTRAST:  142mL OMNIPAQUE IOHEXOL 300 MG/ML  SOLN COMPARISON:  Abdomen radiographs obtained earlier today. Pelvic ultrasound dated 12/29/2009. FINDINGS: CT CHEST FINDINGS Cardiovascular: No significant vascular findings. Normal heart size. No pericardial effusion. Mediastinum/Nodes: Mildly enlarged bilateral axillary and retropectoral lymph nodes. These include a 9 mm short axis right axillary node on image number 14 series 3 and an 8 mm short axis retropectoral node on the left on image number 13 series 3. No enlarged hilar or mediastinal nodes. Thyroid gland, trachea, and esophagus demonstrate no significant findings. There is an incidentally noted 2 mm right lobe thyroid nodule. Lungs/Pleura: Large number of nodules throughout both lungs. The largest on the right is in the inferior aspect of the right upper lobe, measuring 1.5 x 1.0 cm on image number 63 series 5. The largest on the left is a left lower lobe nodule measuring 2.0 x 0.9 cm on image number 86 series 5. No pleural fluid. Musculoskeletal: Large number of small, rounded and oval, circumscribed  subcutaneous nodules bilaterally. There are similar lodges in both breasts. The largest is inferior to the right breast laterally, measuring 2.4 x 1.3 cm on image number 43 series 3. Mild thoracic spine degenerative changes. Lower cervical spine degenerative changes. No bony metastases. A T12 hemangioma is noted. CT ABDOMEN PELVIS FINDINGS Hepatobiliary: Multiple noncalcified gallstones in the gallbladder. These are difficult to separate from each other. The largest individual visualized gallstone measures 8 mm. No gallbladder wall thickening or pericholecystic fluid. Unremarkable liver. Pancreas: Unremarkable. No pancreatic ductal dilatation or surrounding inflammatory changes. Spleen: 1.1 cm oval enhancing mass in the central spleen an additional smaller enhancing masses in that region. Adrenals/Urinary Tract: 2 masses in each adrenal gland. These are heterogeneous. These include a 3.7 x 2.8 cm right adrenal mass and 3.4 x 2.7 cm left adrenal mass on image number 58 of series 3. There is an additional 3.5 x 1.9 cm right adrenal mass and 1.7 x 1.3 cm left adrenal mass on image number 54 series 3. Normal appearing kidneys, ureters and urinary bladder. Stomach/Bowel: Unremarkable stomach, small bowel and colon. No evidence of appendicitis. Vascular/Lymphatic: No significant vascular findings are present. No enlarged abdominal or pelvic lymph nodes. Reproductive: 4.4 x 3.0 cm left myometrial mass. 1.5 x 1.43 cm fundal myometrial mass containing a small calcification. 5.0 x 4.4 cm right ovarian cyst containing low to medium density fluid measuring 26 Hounsfield units in density. Unremarkable left ovary. Other: Large number of small, rounded and oval, circumscribed nodules scattered throughout the abdominal and pelvic peritoneal cavity and retroperitoneum. Large number of similar nodules in the subcutaneous fat bilaterally. These are most numerous in the perineal regions. Musculoskeletal: Right sacral bone island. L5-S1  degenerative changes. IMPRESSION: 1. Large number of metastases in the subcutaneous fat of the chest, abdomen pelvis. 2. Large number of metastases scattered throughout the peritoneum and retroperitoneum of the abdomen and pelvis. 3. Large number of lung metastases. 4. Mild bilateral axillary metastatic adenopathy. 5. Bilateral adrenal metastases. 6. Cholelithiasis. 7. Uterine fibroids. 8. Right ovarian probable endometrioma or hemorrhagic cyst, based on previous ultrasound findings. 9. 2 mm right lobe thyroid nodule. This is most likely an incidental benign nodule. A metastasis is less likely. Electronically Signed   By: Claudie Revering M.D.   On: 01/26/2018 20:41   Mr [name] BT Contrast  Result Date: 01/26/2018 CLINICAL DATA:  Abnormal head CT. Nausea and vomiting and anemia. Headaches EXAM: MRI HEAD WITHOUT AND WITH CONTRAST TECHNIQUE: Multiplanar, multiecho pulse sequences of the brain and surrounding structures were obtained without and with intravenous  contrast. CONTRAST:  78mL MULTIHANCE GADOBENATE DIMEGLUMINE 529 MG/ML IV SOLN COMPARISON:  CT head 01/26/2018 FINDINGS: Brain: Widespread small hemorrhagic lesions are present in the brain. Most these are very small and contain intrinsic T1 shortening due to methemoglobin or melanin. Many lesions also show enhancement suggesting underlying tumor. Largest areas of hemorrhage include the right occipital lobe measuring approximately 12 mm and right parietal lobe measuring 9 mm. Mild edema around the right parietal lesion which also shows enhancement. Approximately 17 lesions are identified in the brain. These are marked with arrows on the axial T1 post-contrast sequence. Ventricle size normal. No midline shift. No areas of acute infarction. No significant chronic ischemia. Vascular: Normal arterial flow voids Skull and upper cervical spine: No lesions in the calvarium identified. However, there are numerous small dermal lesions throughout the scalp and upper neck  bilaterally. Many of these show enhancement and are suspicious for tumor deposits. Sinuses/Orbits: Mucosal edema throughout the paranasal sinuses. Normal orbit Other: None IMPRESSION: Numerous small hemorrhagic lesions throughout the brain many of which show enhancement. Largest lesion in the right parietal lobe shows mild surrounding edema. Findings are highly suspicious for hemorrhagic metastatic disease such as melanoma. In addition, there are numerous enhancing lesions throughout the scalp and skin in the upper neck also suspicious for widespread tumor. These results were called by telephone at the time of interpretation on 01/26/2018 at 4:38 pm to Dr. Duffy Bruce , who verbally acknowledged these results. Electronically Signed   By: Franchot Gallo M.D.   On: 01/26/2018 16:39   Ct Abdomen Pelvis W Contrast  Result Date: 01/26/2018 CLINICAL DATA:  Left mid abdominal pain, nausea and vomiting for the past week. Hematemesis yesterday. Intermittent right breast pain for the past 3 months. Stage IV melanoma. EXAM: CT CHEST, ABDOMEN, AND PELVIS WITH CONTRAST TECHNIQUE: Multidetector CT imaging of the chest, abdomen and pelvis was performed following the standard protocol during bolus administration of intravenous contrast. CONTRAST:  168mL OMNIPAQUE IOHEXOL 300 MG/ML  SOLN COMPARISON:  Abdomen radiographs obtained earlier today. Pelvic ultrasound dated 12/29/2009. FINDINGS: CT CHEST FINDINGS Cardiovascular: No significant vascular findings. Normal heart size. No pericardial effusion. Mediastinum/Nodes: Mildly enlarged bilateral axillary and retropectoral lymph nodes. These include a 9 mm short axis right axillary node on image number 14 series 3 and an 8 mm short axis retropectoral node on the left on image number 13 series 3. No enlarged hilar or mediastinal nodes. Thyroid gland, trachea, and esophagus demonstrate no significant findings. There is an incidentally noted 2 mm right lobe thyroid nodule.  Lungs/Pleura: Large number of nodules throughout both lungs. The largest on the right is in the inferior aspect of the right upper lobe, measuring 1.5 x 1.0 cm on image number 63 series 5. The largest on the left is a left lower lobe nodule measuring 2.0 x 0.9 cm on image number 86 series 5. No pleural fluid. Musculoskeletal: Large number of small, rounded and oval, circumscribed subcutaneous nodules bilaterally. There are similar lodges in both breasts. The largest is inferior to the right breast laterally, measuring 2.4 x 1.3 cm on image number 43 series 3. Mild thoracic spine degenerative changes. Lower cervical spine degenerative changes. No bony metastases. A T12 hemangioma is noted. CT ABDOMEN PELVIS FINDINGS Hepatobiliary: Multiple noncalcified gallstones in the gallbladder. These are difficult to separate from each other. The largest individual visualized gallstone measures 8 mm. No gallbladder wall thickening or pericholecystic fluid. Unremarkable liver. Pancreas: Unremarkable. No pancreatic ductal dilatation or surrounding inflammatory changes. Spleen:  1.1 cm oval enhancing mass in the central spleen an additional smaller enhancing masses in that region. Adrenals/Urinary Tract: 2 masses in each adrenal gland. These are heterogeneous. These include a 3.7 x 2.8 cm right adrenal mass and 3.4 x 2.7 cm left adrenal mass on image number 58 of series 3. There is an additional 3.5 x 1.9 cm right adrenal mass and 1.7 x 1.3 cm left adrenal mass on image number 54 series 3. Normal appearing kidneys, ureters and urinary bladder. Stomach/Bowel: Unremarkable stomach, small bowel and colon. No evidence of appendicitis. Vascular/Lymphatic: No significant vascular findings are present. No enlarged abdominal or pelvic lymph nodes. Reproductive: 4.4 x 3.0 cm left myometrial mass. 1.5 x 1.43 cm fundal myometrial mass containing a small calcification. 5.0 x 4.4 cm right ovarian cyst containing low to medium density fluid  measuring 26 Hounsfield units in density. Unremarkable left ovary. Other: Large number of small, rounded and oval, circumscribed nodules scattered throughout the abdominal and pelvic peritoneal cavity and retroperitoneum. Large number of similar nodules in the subcutaneous fat bilaterally. These are most numerous in the perineal regions. Musculoskeletal: Right sacral bone island. L5-S1 degenerative changes. IMPRESSION: 1. Large number of metastases in the subcutaneous fat of the chest, abdomen pelvis. 2. Large number of metastases scattered throughout the peritoneum and retroperitoneum of the abdomen and pelvis. 3. Large number of lung metastases. 4. Mild bilateral axillary metastatic adenopathy. 5. Bilateral adrenal metastases. 6. Cholelithiasis. 7. Uterine fibroids. 8. Right ovarian probable endometrioma or hemorrhagic cyst, based on previous ultrasound findings. 9. 2 mm right lobe thyroid nodule. This is most likely an incidental benign nodule. A metastasis is less likely. Electronically Signed   By: Claudie Revering M.D.   On: 01/26/2018 20:41   Dg Abd 2 Views  Result Date: 01/26/2018 CLINICAL DATA:  Nausea and vomiting. EXAM: ABDOMEN - 2 VIEW COMPARISON:  None. FINDINGS: The bowel gas pattern is normal. There is no evidence of free air. No radio-opaque calculi or other significant radiographic abnormality is seen. Probable bone island in the right sacral ala. No acute osseous abnormality. IMPRESSION: Negative. Electronically Signed   By: Titus Dubin M.D.   On: 01/26/2018 14:01    Labs:  CBC: Recent Labs    01/26/18 1227 01/27/18 0650 01/27/18 1007  WBC 7.6 7.0  --   HGB 9.8* 9.5* 9.8*  HCT 33.3* 30.3* 31.5*  PLT 131* 133*  --     COAGS: No results for input(s): INR, APTT in the last 8760 hours.  BMP: Recent Labs    01/26/18 1227 01/27/18 0650  NA 139 137  K 4.1 4.1  CL 106 106  CO2 25 23  GLUCOSE 145* 144*  BUN 19 20  CALCIUM 9.2 8.7*  CREATININE 0.77 0.56  GFRNONAA >60 >60    GFRAA >60 >60    LIVER FUNCTION TESTS: Recent Labs    01/26/18 1227  BILITOT 0.6  AST 26  ALT 14  ALKPHOS 79  PROT 7.1  ALBUMIN 3.8    TUMOR MARKERS: No results for input(s): AFPTM, CEA, CA199, CHROMGRNA in the last 8760 hours.  Assessment and Plan: Patient with past medical history of biopsy-proven melanoma with no discernable follow-up presents with complaint of skin growths, hematemesis.  IR consulted for biopsy at the request of Dr. Teryl Lucy. Case reviewed by Dr. Annamaria Boots who approves patient for procedure.  Patient presents today in their usual state of health.  She has been NPO and is not currently on blood thinners. INR pending, however  low bleeding risk.   Risks and benefits discussed with the patient including, but not limited to bleeding, infection, damage to adjacent structures or low yield requiring additional tests.  All of the patient's questions were answered, patient is agreeable to proceed. Consent signed and in chart.  Thank you for this interesting consult.  I greatly enjoyed meeting [facility] and look forward to participating in their care.  A copy of this report was sent to the requesting provider on this date.  Electronically Signed: Docia Barrier, PA 01/27/2018, 10:54 AM   I spent a total of 40 Minutes    in face to face in clinical consultation, greater than 50% of which was counseling/coordinating care for cutaneous lesions/nodules, suspected metastatic melanoma.

## 2018-01-27 NOTE — H&P (View-Only) (Signed)
[facility] CONSULT Reason for consult: Hematemesis Referring Physician: [facility] hospitalist.  PCP: Beryle Beams NP  Kaitlyn Duncan is an 47 y.o. female.  HPI: She is recently moved to [place].  She has a history of a malignant mole on her right upper back that was removed approximately 5 years ago while living in [place].  Do not have any further information about it.  She has been nauseated recently and his vomited on a couple of occasions she thought she was vomiting blood or dark coffee-like material.  Her hemoglobin on admission was 9.8 and her stools were guaiac positive.  At this point she has had CT scanOf the head, abdomen and an MRI of the brain as well showing diffuse metastatic disease.  She underwent a guided liver biopsy today results still pending.  She has been seen by oncology.  At this point the primary source of what appears to be diffuse metastatic disease has not been determined.  The patient denies ever having had an ulcer, taking any acid reducing medication, symptoms of dyspepsia or any significant use of NSAIDs.  She has not ever had colonoscopy.  Past Medical History:  Diagnosis Date  . Anemia   . Anemia   . Diabetes mellitus without complication (HCC)    prediabetic  . Seasonal allergies     Past Surgical History:  Procedure Laterality Date  . MOUTH SURGERY    . OVARY SURGERY     removed "something"    Family History  Problem Relation Age of Onset  . Hypertension Father   . Breast cancer Maternal Aunt   . Diabetes Maternal Aunt   . Breast cancer Paternal Aunt   . Breast cancer Maternal Grandmother   . Breast cancer Paternal Grandmother   . Diabetes Paternal Grandmother     Social History:  reports that she has never smoked. She has never used smokeless tobacco. She reports that she does not drink alcohol or use drugs.  Allergies: No Known Allergies  Medications; Prior to Admission medications   Medication Sig Start Date End Date  Taking? Authorizing Provider  acetaminophen (TYLENOL) 325 MG tablet Take 650 mg by mouth every 6 (six) hours as needed.   Yes [provider]  ibuprofen (ADVIL,MOTRIN) 100 MG tablet Take 200 mg by mouth every 6 (six) hours as needed for pain or fever.    Yes [provider]  loratadine (CLARITIN) 10 MG tablet Take 10 mg by mouth daily.   Yes [provider]  Multiple Vitamin (MULTIVITAMIN WITH MINERALS) TABS tablet Take 1 tablet by mouth daily.   Yes [provider]   . dexamethasone  4 mg Intravenous Q6H  . fentaNYL      . lidocaine      . loratadine  10 mg Oral Daily  . midazolam      . multivitamin with minerals  1 tablet Oral Daily  . pantoprazole (PROTONIX) IV  40 mg Intravenous Q12H   PRN Meds  Results for orders placed or performed during the hospital encounter of 01/26/18 (from the past 48 hour(s))  Comprehensive metabolic panel     Status: Abnormal   Collection Time: 01/26/18 12:27 PM  Result Value Ref Range   Sodium 139 135 - 145 mmol/L   Potassium 4.1 3.5 - 5.1 mmol/L   Chloride 106 98 - 111 mmol/L   CO2 25 22 - 32 mmol/L   Glucose, Bld 145 (H) 70 - 99 mg/dL   BUN 19 6 - 20  mg/dL   Creatinine, Ser 0.77 0.44 - 1.00 mg/dL   Calcium 9.2 8.9 - 10.3 mg/dL   Total Protein 7.1 6.5 - 8.1 g/dL   Albumin 3.8 3.5 - 5.0 g/dL   AST 26 15 - 41 U/L   ALT 14 0 - 44 U/L   Alkaline Phosphatase 79 38 - 126 U/L   Total Bilirubin 0.6 0.3 - 1.2 mg/dL   GFR calc non Af Amer >60 >60 mL/min   GFR calc Af Amer >60 >60 mL/min    Comment: (NOTE) The eGFR has been calculated using the CKD EPI equation. This calculation has not been validated in all clinical situations. eGFR's persistently <60 mL/min signify possible Chronic Kidney Disease.    Anion gap 8 5 - 15    Comment: Performed at [facility] [address] [place], [place] [address]  CBC     Status: Abnormal   Collection Time: 01/26/18 12:27 PM  Result Value Ref Range   WBC 7.6 4.0 -  10.5 K/uL   RBC 3.99 3.87 - 5.11 MIL/uL   Hemoglobin 9.8 (L) 12.0 - 15.0 g/dL   HCT 33.3 (L) 36.0 - 46.0 %   MCV 83.5 78.0 - 100.0 fL   MCH 24.6 (L) 26.0 - 34.0 pg   MCHC 29.4 (L) 30.0 - 36.0 g/dL   RDW 14.4 11.5 - 15.5 %   Platelets 131 (L) 150 - 400 K/uL    Comment: Performed at [facility], [place] [address] [place], [name] [address]  Type and screen [place]     Status: None   Collection Time: 01/26/18 12:27 PM  Result Value Ref Range   ABO/RH(D) A POS    Antibody Screen NEG    Sample Expiration      01/29/2018 Performed at [facility], [place] [address], [place], [place] [address]   ABO/Rh     Status: None   Collection Time: 01/26/18 12:27 PM  Result Value Ref Range   ABO/RH(D)      A POS Performed at [facility] [address] [place], [place] [address]   I-Stat beta hCG blood, ED     Status: None   Collection Time: 01/26/18 12:37 PM  Result Value Ref Range   I-stat hCG, quantitative <5.0 <5 mIU/mL   Comment 3            Comment:   GEST. AGE      CONC.  (mIU/mL)   <=1 WEEK        5 - 50     2 WEEKS       50 - 500     3 WEEKS       100 - 10,000     4 WEEKS     1,000 - 30,000        FEMALE AND NON-PREGNANT FEMALE:     LESS THAN 5 mIU/mL   POC occult blood, ED     Status: Abnormal   Collection Time: 01/26/18  1:38 PM  Result Value Ref Range   Fecal Occult Bld POSITIVE (A) NEGATIVE  Glucose, capillary     Status: Abnormal   Collection Time: 01/26/18 10:50 PM  Result Value Ref Range   Glucose-Capillary 158 (H) 70 - 99 mg/dL  Glucose, capillary     Status: Abnormal   Collection Time: 01/27/18  5:21 AM  Result Value Ref Range   Glucose-Capillary 144 (H) 70 - 99 mg/dL  Basic metabolic panel     Status: Abnormal   Collection Time: 01/27/18  6:50 AM  Result Value Ref Range   Sodium 137 135 - 145 mmol/L   Potassium 4.1 3.5 - 5.1 mmol/L   Chloride 106 98 - 111 mmol/L   CO2 23 22 - 32 mmol/L   Glucose, Bld 144 (H) 70 - 99 mg/dL    BUN 20 6 - 20 mg/dL   Creatinine, Ser 0.56 0.44 - 1.00 mg/dL   Calcium 8.7 (L) 8.9 - 10.3 mg/dL   GFR calc non Af Amer >60 >60 mL/min   GFR calc Af Amer >60 >60 mL/min    Comment: (NOTE) The eGFR has been calculated using the CKD EPI equation. This calculation has not been validated in all clinical situations. eGFR's persistently <60 mL/min signify possible Chronic Kidney Disease.    Anion gap 8 5 - 15    Comment: Performed at [facility], [place] [address], [place], [place] [address]  CBC     Status: Abnormal   Collection Time: 01/27/18  6:50 AM  Result Value Ref Range   WBC 7.0 4.0 - 10.5 K/uL   RBC 3.74 (L) 3.87 - 5.11 MIL/uL   Hemoglobin 9.5 (L) 12.0 - 15.0 g/dL   HCT 30.3 (L) 36.0 - 46.0 %   MCV 81.0 78.0 - 100.0 fL   MCH 25.4 (L) 26.0 - 34.0 pg   MCHC 31.4 30.0 - 36.0 g/dL   RDW 14.5 11.5 - 15.5 %   Platelets 133 (L) 150 - 400 K/uL    Comment: Performed at [facility], [place] [address] [place], [place] [address]  Hemoglobin and hematocrit, blood     Status: Abnormal   Collection Time: 01/27/18 10:07 AM  Result Value Ref Range   Hemoglobin 9.8 (L) 12.0 - 15.0 g/dL   HCT 31.5 (L) 36.0 - 46.0 %    Comment: Performed at [facility], [place] [address], [place], [place] [address]  Protime-INR     Status: None   Collection Time: 01/27/18 10:07 AM  Result Value Ref Range   Prothrombin Time 13.2 11.4 - 15.2 seconds   INR 1.01     Comment: Performed at [facility], [place] [address], [place],  [address]  Glucose, capillary     Status: None   Collection Time: 01/27/18 12:29 PM  Result Value Ref Range   Glucose-Capillary 97 70 - 99 mg/dL  Glucose, capillary     Status: Abnormal   Collection Time: 01/27/18  5:07 PM  Result Value Ref Range   Glucose-Capillary 230 (H) 70 - 99 mg/dL    Ct Head Wo Contrast  Result Date: 01/26/2018 CLINICAL DATA:  Nausea and vomiting for 1 week EXAM: CT HEAD WITHOUT  CONTRAST TECHNIQUE: Contiguous axial images were obtained from the base of the skull through the vertex without intravenous contrast. COMPARISON:  None. FINDINGS: Brain: There is an ill-defined hyperdense focus in the medial right parietal lobe measuring approximately 1 cm. There is no surrounding vasogenic edema. There is no mass effect or midline shift. Ventricular system and extra-axial space are within normal limits. Vascular: No hyperdense vessel or unexpected calcification. Skull: The cranium is intact. Sinuses/Orbits: There is opacification of the left maxillary sinus with postoperative changes. Small mucous retention cyst in the right maxillary sinus. Nasal septum is deviated to the left. Orbits are within normal limits. Other: Noncontributory IMPRESSION: There is a hyperdense focus in the right parietal lobe.  Differential diagnosis includes a hyperdense mass, cavernous angioma, or subacute hemorrhage. Initially, CT head with contrast may be helpful. Ultimately, brain MRI may be required. Electronically Signed   By: Marybelle Killings M.D.   On: 01/26/2018 14:10   Ct Chest W Contrast  Result Date: 01/26/2018 CLINICAL DATA:  Left mid abdominal pain, nausea and vomiting for the past week. Hematemesis yesterday. Intermittent right breast pain for the past 3 months. Stage IV melanoma. EXAM: CT CHEST, ABDOMEN, AND PELVIS WITH CONTRAST TECHNIQUE: Multidetector CT imaging of the chest, abdomen and pelvis was performed following the standard protocol during bolus administration of intravenous contrast. CONTRAST:  151m OMNIPAQUE IOHEXOL 300 MG/ML  SOLN COMPARISON:  Abdomen radiographs obtained earlier today. Pelvic ultrasound dated 12/29/2009. FINDINGS: CT CHEST FINDINGS Cardiovascular: No significant vascular findings. Normal heart size. No pericardial effusion. Mediastinum/Nodes: Mildly enlarged bilateral axillary and retropectoral lymph nodes. These include a 9 mm short axis right axillary node on image number 14  series 3 and an 8 mm short axis retropectoral node on the left on image number 13 series 3. No enlarged hilar or mediastinal nodes. Thyroid gland, trachea, and esophagus demonstrate no significant findings. There is an incidentally noted 2 mm right lobe thyroid nodule. Lungs/Pleura: Large number of nodules throughout both lungs. The largest on the right is in the inferior aspect of the right upper lobe, measuring 1.5 x 1.0 cm on image number 63 series 5. The largest on the left is a left lower lobe nodule measuring 2.0 x 0.9 cm on image number 86 series 5. No pleural fluid. Musculoskeletal: Large number of small, rounded and oval, circumscribed subcutaneous nodules bilaterally. There are similar lodges in both breasts. The largest is inferior to the right breast laterally, measuring 2.4 x 1.3 cm on image number 43 series 3. Mild thoracic spine degenerative changes. Lower cervical spine degenerative changes. No bony metastases. A T12 hemangioma is noted. CT ABDOMEN PELVIS FINDINGS Hepatobiliary: Multiple noncalcified gallstones in the gallbladder. These are difficult to separate from each other. The largest individual visualized gallstone measures 8 mm. No gallbladder wall thickening or pericholecystic fluid. Unremarkable liver. Pancreas: Unremarkable. No pancreatic ductal dilatation or surrounding inflammatory changes. Spleen: 1.1 cm oval enhancing mass in the central spleen an additional smaller enhancing masses in that region. Adrenals/Urinary Tract: 2 masses in each adrenal gland. These are heterogeneous. These include a 3.7 x 2.8 cm right adrenal mass and 3.4 x 2.7 cm left adrenal mass on image number 58 of series 3. There is an additional 3.5 x 1.9 cm right adrenal mass and 1.7 x 1.3 cm left adrenal mass on image number 54 series 3. Normal appearing kidneys, ureters and urinary bladder. Stomach/Bowel: Unremarkable stomach, small bowel and colon. No evidence of appendicitis. Vascular/Lymphatic: No significant  vascular findings are present. No enlarged abdominal or pelvic lymph nodes. Reproductive: 4.4 x 3.0 cm left myometrial mass. 1.5 x 1.43 cm fundal myometrial mass containing a small calcification. 5.0 x 4.4 cm right ovarian cyst containing low to medium density fluid measuring 26 Hounsfield units in density. Unremarkable left ovary. Other: Large number of small, rounded and oval, circumscribed nodules scattered throughout the abdominal and pelvic peritoneal cavity and retroperitoneum. Large number of similar nodules in the subcutaneous fat bilaterally. These are most numerous in the perineal regions. Musculoskeletal: Right sacral bone island. L5-S1 degenerative changes. IMPRESSION: 1. Large number of metastases in the subcutaneous fat of the chest, abdomen pelvis. 2. Large number of metastases scattered throughout the peritoneum and retroperitoneum of the  abdomen and pelvis. 3. Large number of lung metastases. 4. Mild bilateral axillary metastatic adenopathy. 5. Bilateral adrenal metastases. 6. Cholelithiasis. 7. Uterine fibroids. 8. Right ovarian probable endometrioma or hemorrhagic cyst, based on previous ultrasound findings. 9. 2 mm right lobe thyroid nodule. This is most likely an incidental benign nodule. A metastasis is less likely. Electronically Signed   By: Claudie Revering M.D.   On: 01/26/2018 20:41   Mr [name] EL Contrast  Result Date: 01/26/2018 CLINICAL DATA:  Abnormal head CT. Nausea and vomiting and anemia. Headaches EXAM: MRI HEAD WITHOUT AND WITH CONTRAST TECHNIQUE: Multiplanar, multiecho pulse sequences of the brain and surrounding structures were obtained without and with intravenous contrast. CONTRAST:  65m MULTIHANCE GADOBENATE DIMEGLUMINE 529 MG/ML IV SOLN COMPARISON:  CT head 01/26/2018 FINDINGS: Brain: Widespread small hemorrhagic lesions are present in the brain. Most these are very small and contain intrinsic T1 shortening due to methemoglobin or melanin. Many lesions also show  enhancement suggesting underlying tumor. Largest areas of hemorrhage include the right occipital lobe measuring approximately 12 mm and right parietal lobe measuring 9 mm. Mild edema around the right parietal lesion which also shows enhancement. Approximately 17 lesions are identified in the brain. These are marked with arrows on the axial T1 post-contrast sequence. Ventricle size normal. No midline shift. No areas of acute infarction. No significant chronic ischemia. Vascular: Normal arterial flow voids Skull and upper cervical spine: No lesions in the calvarium identified. However, there are numerous small dermal lesions throughout the scalp and upper neck bilaterally. Many of these show enhancement and are suspicious for tumor deposits. Sinuses/Orbits: Mucosal edema throughout the paranasal sinuses. Normal orbit Other: None IMPRESSION: Numerous small hemorrhagic lesions throughout the brain many of which show enhancement. Largest lesion in the right parietal lobe shows mild surrounding edema. Findings are highly suspicious for hemorrhagic metastatic disease such as melanoma. In addition, there are numerous enhancing lesions throughout the scalp and skin in the upper neck also suspicious for widespread tumor. These results were called by telephone at the time of interpretation on 01/26/2018 at 4:38 pm to Dr. CDuffy Bruce, who verbally acknowledged these results. Electronically Signed   By: CFranchot GalloM.D.   On: 01/26/2018 16:39   Ct Abdomen Pelvis W Contrast  Result Date: 01/26/2018 CLINICAL DATA:  Left mid abdominal pain, nausea and vomiting for the past week. Hematemesis yesterday. Intermittent right breast pain for the past 3 months. Stage IV melanoma. EXAM: CT CHEST, ABDOMEN, AND PELVIS WITH CONTRAST TECHNIQUE: Multidetector CT imaging of the chest, abdomen and pelvis was performed following the standard protocol during bolus administration of intravenous contrast. CONTRAST:  1066mOMNIPAQUE IOHEXOL  300 MG/ML  SOLN COMPARISON:  Abdomen radiographs obtained earlier today. Pelvic ultrasound dated 12/29/2009. FINDINGS: CT CHEST FINDINGS Cardiovascular: No significant vascular findings. Normal heart size. No pericardial effusion. Mediastinum/Nodes: Mildly enlarged bilateral axillary and retropectoral lymph nodes. These include a 9 mm short axis right axillary node on image number 14 series 3 and an 8 mm short axis retropectoral node on the left on image number 13 series 3. No enlarged hilar or mediastinal nodes. Thyroid gland, trachea, and esophagus demonstrate no significant findings. There is an incidentally noted 2 mm right lobe thyroid nodule. Lungs/Pleura: Large number of nodules throughout both lungs. The largest on the right is in the inferior aspect of the right upper lobe, measuring 1.5 x 1.0 cm on image number 63 series 5. The largest on the left is a left lower lobe nodule  measuring 2.0 x 0.9 cm on image number 86 series 5. No pleural fluid. Musculoskeletal: Large number of small, rounded and oval, circumscribed subcutaneous nodules bilaterally. There are similar lodges in both breasts. The largest is inferior to the right breast laterally, measuring 2.4 x 1.3 cm on image number 43 series 3. Mild thoracic spine degenerative changes. Lower cervical spine degenerative changes. No bony metastases. A T12 hemangioma is noted. CT ABDOMEN PELVIS FINDINGS Hepatobiliary: Multiple noncalcified gallstones in the gallbladder. These are difficult to separate from each other. The largest individual visualized gallstone measures 8 mm. No gallbladder wall thickening or pericholecystic fluid. Unremarkable liver. Pancreas: Unremarkable. No pancreatic ductal dilatation or surrounding inflammatory changes. Spleen: 1.1 cm oval enhancing mass in the central spleen an additional smaller enhancing masses in that region. Adrenals/Urinary Tract: 2 masses in each adrenal gland. These are heterogeneous. These include a 3.7 x 2.8 cm  right adrenal mass and 3.4 x 2.7 cm left adrenal mass on image number 58 of series 3. There is an additional 3.5 x 1.9 cm right adrenal mass and 1.7 x 1.3 cm left adrenal mass on image number 54 series 3. Normal appearing kidneys, ureters and urinary bladder. Stomach/Bowel: Unremarkable stomach, small bowel and colon. No evidence of appendicitis. Vascular/Lymphatic: No significant vascular findings are present. No enlarged abdominal or pelvic lymph nodes. Reproductive: 4.4 x 3.0 cm left myometrial mass. 1.5 x 1.43 cm fundal myometrial mass containing a small calcification. 5.0 x 4.4 cm right ovarian cyst containing low to medium density fluid measuring 26 Hounsfield units in density. Unremarkable left ovary. Other: Large number of small, rounded and oval, circumscribed nodules scattered throughout the abdominal and pelvic peritoneal cavity and retroperitoneum. Large number of similar nodules in the subcutaneous fat bilaterally. These are most numerous in the perineal regions. Musculoskeletal: Right sacral bone island. L5-S1 degenerative changes. IMPRESSION: 1. Large number of metastases in the subcutaneous fat of the chest, abdomen pelvis. 2. Large number of metastases scattered throughout the peritoneum and retroperitoneum of the abdomen and pelvis. 3. Large number of lung metastases. 4. Mild bilateral axillary metastatic adenopathy. 5. Bilateral adrenal metastases. 6. Cholelithiasis. 7. Uterine fibroids. 8. Right ovarian probable endometrioma or hemorrhagic cyst, based on previous ultrasound findings. 9. 2 mm right lobe thyroid nodule. This is most likely an incidental benign nodule. A metastasis is less likely. Electronically Signed   By: Claudie Revering M.D.   On: 01/26/2018 20:41   Dg Abd 2 Views  Result Date: 01/26/2018 CLINICAL DATA:  Nausea and vomiting. EXAM: ABDOMEN - 2 VIEW COMPARISON:  None. FINDINGS: The bowel gas pattern is normal. There is no evidence of free air. No radio-opaque calculi or other  significant radiographic abnormality is seen. Probable bone island in the right sacral ala. No acute osseous abnormality. IMPRESSION: Negative. Electronically Signed   By: Titus Dubin M.D.   On: 01/26/2018 14:01   Ms Digital Screening Tomo Bilateral  Result Date: 01/27/2018 CLINICAL DATA:  Screening. 47 year old female with known metastatic melanoma. Patient had a CT scan of the chest, abdomen and pelvis on 01/26/2018 which showed a large number of subcutaneous metastasis in the fat of the chest, abdomen and pelvis. EXAM: DIGITAL SCREENING BILATERAL MAMMOGRAM WITH TOMO AND CAD COMPARISON:  Previous exam(s). ACR Breast Density Category c: The breast tissue is heterogeneously dense, which may obscure small masses. FINDINGS: There are no findings suspicious for breast malignancy. Innumerable rounded masses are seen bilaterally consistent with subcutaneous metastatic melanoma. Images were processed with CAD. IMPRESSION: Innumerable subcutaneous  nodules in both breast consistent with metastatic melanoma. No mammographic evidence of breast malignancy. A result letter of this screening mammogram will be mailed directly to the patient. RECOMMENDATION: Screening mammogram in one year. (Code:SM-B-01Y) BI-RADS CATEGORY  1: Negative. Electronically Signed   By: Lillia Mountain M.D.   On: 01/27/2018 13:43   [place] Core Biopsy (soft Tissue)  Result Date: 01/27/2018 INDICATION: Melanoma, cutaneous metastases EXAM: Ultrasound right chest wall subcutaneous mass core biopsy MEDICATIONS: 1% lidocaine local ANESTHESIA/SEDATION: Moderate (conscious) sedation was employed during this procedure. A total of Versed 2.0 mg and Fentanyl 50 mcg was administered intravenously. Moderate Sedation Time: 10 minutes. The patient's level of consciousness and vital signs were monitored continuously by radiology nursing throughout the procedure under my direct supervision. FLUOROSCOPY TIME:  Fluoroscopy Time: None. COMPLICATIONS: None immediate.  PROCEDURE: Informed written consent was obtained from the patient after a thorough discussion of the procedural risks, benefits and alternatives. All questions were addressed. Maximal Sterile Barrier Technique was utilized including caps, mask, sterile gowns, sterile gloves, sterile drape, hand hygiene and skin antiseptic. A timeout was performed prior to the initiation of the procedure. Previous imaging reviewed. Preliminary ultrasound performed. The right lateral chest 2 cm subcutaneous nodule by chest CT was localized with ultrasound just inferior to the right breast. Overlying skin marked. Under sterile conditions and local anesthesia, an 18 gauge core biopsy was advanced to the lesion. Needle position confirmed with ultrasound. 18 gauge core biopsies obtained (4). Samples placed in formalin. Needle removed. Postprocedure imaging demonstrates no hemorrhage or hematoma. Patient tolerated the biopsy well. IMPRESSION: Successful ultrasound right lateral chest wall subcutaneous mass core biopsies Electronically Signed   By: Jerilynn Mages.  Shick M.D.   On: 01/27/2018 12:06               Blood pressure 110/69, pulse 82, temperature 98.9 F (37.2 C), temperature source Oral, resp. rate 16, height _0  (1.549 m), weight 73.5 kg (162 lb), last menstrual period 01/15/2018, SpO2 97 %.  Physical exam:   General--Pleasant white female eating a popsicle ENT--nonicteric Neck--no lymphadenopathy Heart--regular rate and rhythm without murmurs or gallops Lungs--clear Abdomen--completely nontender Psych--alert and oriented answers questions appropriately   Assessment: 1.  Diffuse metastatic disease of unknown primary 2.  Hematemesis.  Could be a benign ulcer or primary tumor agree endoscopy may be helpful  Plan: We will go ahead and arrange EGD tomorrow.  Time is still somewhat unclear.  Have discussed with patient she is agreeable.   Nancy Fetter 01/27/2018, 5:56 PM   This note was created using  voice recognition software and minor errors may Have occurred unintentionally. Pager: [phone] If no answer or after hours call [phone]

## 2018-01-27 NOTE — Consult Note (Signed)
Radiation Oncology         [phone] ________________________________  Initial inpatient Consultation  Name: Kaitlyn Duncan MRN: [id number]  Date of Service: 01/26/2018 DOB: 09/03/1970  HG:[id number], [name], NP  No ref. provider found   REFERRING PHYSICIAN: No ref. provider found  DIAGNOSIS: The primary encounter diagnosis was Brain metastases ([place]). Diagnoses of Hematemesis and Metastatic cancer ([place]) were also pertinent to this visit.    ICD-10-CM   1. Brain metastases ([place]) C79.31   2. Hematemesis K92.0 DG Abd 2 Views    DG Abd 2 Views  3. Metastatic cancer ([place]) C79.9 [place] CORE BIOPSY (SOFT TISSUE)    [place] CORE BIOPSY (SOFT TISSUE)    CANCELED: IR Radiologist Eval & Mgmt    CANCELED: IR Radiologist Eval & Mgmt    CANCELED: US BIOPSY (LIVER)    CANCELED: US BIOPSY (LIVER)    HISTORY OF PRESENT ILLNESS: Kaitlyn Duncan is a 47 y.o. female seen at the request of Dr. Marthenia Rolling.  She presented to the emergency department on 01/26/2018 with intractable nausea, vomiting and coffee-ground emesis.  On admission, she reported having had nausea, fatigue, loss of appetite, melena, increased sensitivity to certain smells and a weird taste in her mouth for the week prior.  She reported vomiting for 2 days and was seen in the urgent care on 01/25/2018 where she was prescribed Zofran for nausea and given a prescription for doxycycline to treat a suspected axillary cyst.  She also reported multiple nodules that had developed in the left axilla, left scalp and left neck.  She denied any headaches, dizziness, visual or auditory changes, abdominal pain, unintended weight loss, or significant fatigue.  She does have a history of a biopsy-proven malignant mole removed from the right upper back in 2014 through the [facility] in [place], [place]. but does not recall what type of malignancy specifically.  She had a CT of the head on 01/26/2018 which revealed a hyperdense focus in the right  parietal lobe.  This was further evaluated with an MRI of the brain revealing at least 17 small, hemorrhagic lesions throughout the brain, many of which show enhancement.  The largest areas of hemorrhage include the right occipital lobe measuring approximately 12 mm with mild surrounding edema and a 9 millimeter lesion in the right parietal lobe highly suspicious for hemorrhagic metastatic disease such as melanoma.  Additionally, there are numerous enhancing lesions throughout the scalp and skin in the upper neck also suspicious for widespread metastatic disease.  She has been started on IV Keppra 1000 mg every 12 hours and IV Decadron 4 mg every 6 hours with significant improvement of her N/V and fatigue.  CT C/A/P was performed 01/26/18 for further disease staging and reveal diffuse metastatic disease in the subcutaneous fat of the chest, abdomen and pelvis with a large number of metastases scattered throughout the peritoneum and retroperitoneum, a large number of lung metastases, bilateral axillary metastatic adenopathy, and bilateral adrenal metastases.    She underwent an ultrasound-guided core needle biopsy of a right chest wall cutaneous lesion earlier today for tissue confirmation.  Pathology remains pending.  We have been asked to consult on this patient to discuss the potential role of radiotherapy in the management of her disease.  PREVIOUS RADIATION THERAPY: No  PAST MEDICAL HISTORY:  Past Medical History:  Diagnosis Date  . Anemia   . Anemia   . Diabetes mellitus without complication (HCC)    prediabetic  . Seasonal allergies  PAST SURGICAL HISTORY: Past Surgical History:  Procedure Laterality Date  . MOUTH SURGERY    . OVARY SURGERY     removed "something"    FAMILY HISTORY:  Family History  Problem Relation Age of Onset  . Hypertension Father   . Breast cancer Maternal Aunt   . Diabetes Maternal Aunt   . Breast cancer Paternal Aunt   . Breast cancer Maternal  Grandmother   . Breast cancer Paternal Grandmother   . Diabetes Paternal Grandmother     SOCIAL HISTORY:  Social History   Socioeconomic History  . Marital status: Single    Spouse name: Not on file  . Number of children: Not on file  . Years of education: Not on file  . Highest education level: Not on file  Occupational History  . Not on file  Social Needs  . Financial resource strain: Not on file  . Food insecurity:    Worry: Not on file    Inability: Not on file  . Transportation needs:    Medical: Not on file    Non-medical: Not on file  Tobacco Use  . Smoking status: Never Smoker  . Smokeless tobacco: Never Used  Substance and Sexual Activity  . Alcohol use: No  . Drug use: No  . Sexual activity: Not Currently  Lifestyle  . Physical activity:    Days per week: Not on file    Minutes per session: Not on file  . Stress: Not on file  Relationships  . Social connections:    Talks on phone: Not on file    Gets together: Not on file    Attends religious service: Not on file    Active member of club or organization: Not on file    Attends meetings of clubs or organizations: Not on file    Relationship status: Not on file  . Intimate partner violence:    Fear of current or ex partner: Not on file    Emotionally abused: Not on file    Physically abused: Not on file    Forced sexual activity: Not on file  Other Topics Concern  . Not on file  Social History Narrative  . Not on file    ALLERGIES: Patient has no known allergies.  MEDICATIONS:  Current Facility-Administered Medications  Medication Dose Route Frequency Provider Last Rate Last Dose  . dexamethasone (DECADRON) injection 4 mg  4 mg Intravenous Q6H Dana Allan I, MD   4 mg at 01/27/18 0523  . fentaNYL (SUBLIMAZE) 100 MCG/2ML injection           . levETIRAcetam (KEPPRA) IVPB 1000 mg/100 mL premix  1,000 mg Intravenous Q12H Bonnell Public, MD   Stopped at 01/27/18 1149  . lidocaine  (XYLOCAINE) 1 % (with pres) injection           . loratadine (CLARITIN) tablet 10 mg  10 mg Oral Daily Dana Allan I, MD   10 mg at 01/27/18 1033  . midazolam (VERSED) 2 MG/2ML injection           . multivitamin with minerals tablet 1 tablet  1 tablet Oral Daily Dana Allan I, MD   1 tablet at 01/27/18 1033  . pantoprazole (PROTONIX) injection 40 mg  40 mg Intravenous Q12H Emokpae, Ejiroghene E, MD   40 mg at 01/27/18 1033    REVIEW OF SYSTEMS:  On review of systems, the patient reports that she is doing well overall. She currently denies any chest pain, shortness  of breath, cough, fevers, chills, night sweats, or unintended weight changes. She has noted multiple nodules on her scalp, bilateral axilla and left gluteal fold.  She denies any bowel or bladder disturbances, and denies abdominal pain, nausea or vomiting. She denies any new musculoskeletal or joint aches or pains. A complete review of systems is obtained and is otherwise negative.    PHYSICAL EXAM:  Wt Readings from Last 3 Encounters:  01/26/18 162 lb (73.5 kg)  01/26/18 162 lb (73.5 kg)  04/29/16 160 lb (72.6 kg)   Temp Readings from Last 3 Encounters:  01/27/18 98.6 F (37 C) (Oral)  01/25/18 98.2 F (36.8 C) (Oral)  11/26/17 98.7 F (37.1 C) (Oral)   BP Readings from Last 3 Encounters:  01/27/18 114/68  01/26/18 (!) 144/80  01/25/18 120/72   Pulse Readings from Last 3 Encounters:  01/27/18 92  01/25/18 (!) 104  11/26/17 77   Pain Assessment Pain Score: 0-No pain/10  In general this is a well appearing caucasian female in no acute distress. She is alert and oriented x4 and appropriate throughout the examination. HEENT reveals that the patient is normocephalic, atraumatic. EOMs are intact. PERRLA. Skin is intact without any evidence of gross lesions. Cardiovascular exam reveals a regular rate and rhythm, no clicks rubs or murmurs are auscultated. Chest is clear to auscultation bilaterally. Lymphatic  assessment is performed and reveals palpable adenopathy in the bilateral axilla but no cervical, supraclavicular or inguinal LAD appreciated. Abdomen has active bowel sounds in all quadrants and is intact. The abdomen is soft, non tender, non distended. Lower extremities are negative for pretibial pitting edema, deep calf tenderness, cyanosis or clubbing.   KPS = 90  100 - Normal; no complaints; no evidence of disease. 90   - Able to carry on normal activity; minor signs or symptoms of disease. 80   - Normal activity with effort; some signs or symptoms of disease. 23   - Cares for self; unable to carry on normal activity or to do active work. 60   - Requires occasional assistance, but is able to care for most of his personal needs. 50   - Requires considerable assistance and frequent medical care. 56   - Disabled; requires special care and assistance. 25   - Severely disabled; hospital admission is indicated although death not imminent. 89   - Very sick; hospital admission necessary; active supportive treatment necessary. 10   - Moribund; fatal processes progressing rapidly. 0     - Dead  Karnofsky DA, Abelmann Shelbina, Craver LS and Burchenal [facility] [phone]) The use of the nitrogen mustards in the palliative treatment of carcinoma: with particular reference to bronchogenic carcinoma Cancer 1 [id number]  LABORATORY DATA:  Lab Results  Component Value Date   WBC 7.0 01/27/2018   HGB 9.8 (L) 01/27/2018   HCT 31.5 (L) 01/27/2018   MCV 81.0 01/27/2018   PLT 133 (L) 01/27/2018   Lab Results  Component Value Date   NA 137 01/27/2018   K 4.1 01/27/2018   CL 106 01/27/2018   CO2 23 01/27/2018   Lab Results  Component Value Date   ALT 14 01/26/2018   AST 26 01/26/2018   ALKPHOS 79 01/26/2018   BILITOT 0.6 01/26/2018     RADIOGRAPHY: Ct Head Wo Contrast  Result Date: 01/26/2018 CLINICAL DATA:  Nausea and vomiting for 1 week EXAM: CT HEAD WITHOUT CONTRAST TECHNIQUE: Contiguous axial images were  obtained from the base of the skull through the vertex without  intravenous contrast. COMPARISON:  None. FINDINGS: Brain: There is an ill-defined hyperdense focus in the medial right parietal lobe measuring approximately 1 cm. There is no surrounding vasogenic edema. There is no mass effect or midline shift. Ventricular system and extra-axial space are within normal limits. Vascular: No hyperdense vessel or unexpected calcification. Skull: The cranium is intact. Sinuses/Orbits: There is opacification of the left maxillary sinus with postoperative changes. Small mucous retention cyst in the right maxillary sinus. Nasal septum is deviated to the left. Orbits are within normal limits. Other: Noncontributory IMPRESSION: There is a hyperdense focus in the right parietal lobe. Differential diagnosis includes a hyperdense mass, cavernous angioma, or subacute hemorrhage. Initially, CT head with contrast may be helpful. Ultimately, brain MRI may be required. Electronically Signed   By: Marybelle Killings M.D.   On: 01/26/2018 14:10   Ct Chest W Contrast  Result Date: 01/26/2018 CLINICAL DATA:  Left mid abdominal pain, nausea and vomiting for the past week. Hematemesis yesterday. Intermittent right breast pain for the past 3 months. Stage IV melanoma. EXAM: CT CHEST, ABDOMEN, AND PELVIS WITH CONTRAST TECHNIQUE: Multidetector CT imaging of the chest, abdomen and pelvis was performed following the standard protocol during bolus administration of intravenous contrast. CONTRAST:  164mL OMNIPAQUE IOHEXOL 300 MG/ML  SOLN COMPARISON:  Abdomen radiographs obtained earlier today. Pelvic ultrasound dated 12/29/2009. FINDINGS: CT CHEST FINDINGS Cardiovascular: No significant vascular findings. Normal heart size. No pericardial effusion. Mediastinum/Nodes: Mildly enlarged bilateral axillary and retropectoral lymph nodes. These include a 9 mm short axis right axillary node on image number 14 series 3 and an 8 mm short axis retropectoral node  on the left on image number 13 series 3. No enlarged hilar or mediastinal nodes. Thyroid gland, trachea, and esophagus demonstrate no significant findings. There is an incidentally noted 2 mm right lobe thyroid nodule. Lungs/Pleura: Large number of nodules throughout both lungs. The largest on the right is in the inferior aspect of the right upper lobe, measuring 1.5 x 1.0 cm on image number 63 series 5. The largest on the left is a left lower lobe nodule measuring 2.0 x 0.9 cm on image number 86 series 5. No pleural fluid. Musculoskeletal: Large number of small, rounded and oval, circumscribed subcutaneous nodules bilaterally. There are similar lodges in both breasts. The largest is inferior to the right breast laterally, measuring 2.4 x 1.3 cm on image number 43 series 3. Mild thoracic spine degenerative changes. Lower cervical spine degenerative changes. No bony metastases. A T12 hemangioma is noted. CT ABDOMEN PELVIS FINDINGS Hepatobiliary: Multiple noncalcified gallstones in the gallbladder. These are difficult to separate from each other. The largest individual visualized gallstone measures 8 mm. No gallbladder wall thickening or pericholecystic fluid. Unremarkable liver. Pancreas: Unremarkable. No pancreatic ductal dilatation or surrounding inflammatory changes. Spleen: 1.1 cm oval enhancing mass in the central spleen an additional smaller enhancing masses in that region. Adrenals/Urinary Tract: 2 masses in each adrenal gland. These are heterogeneous. These include a 3.7 x 2.8 cm right adrenal mass and 3.4 x 2.7 cm left adrenal mass on image number 58 of series 3. There is an additional 3.5 x 1.9 cm right adrenal mass and 1.7 x 1.3 cm left adrenal mass on image number 54 series 3. Normal appearing kidneys, ureters and urinary bladder. Stomach/Bowel: Unremarkable stomach, small bowel and colon. No evidence of appendicitis. Vascular/Lymphatic: No significant vascular findings are present. No enlarged abdominal  or pelvic lymph nodes. Reproductive: 4.4 x 3.0 cm left myometrial mass. 1.5 x 1.43  cm fundal myometrial mass containing a small calcification. 5.0 x 4.4 cm right ovarian cyst containing low to medium density fluid measuring 26 Hounsfield units in density. Unremarkable left ovary. Other: Large number of small, rounded and oval, circumscribed nodules scattered throughout the abdominal and pelvic peritoneal cavity and retroperitoneum. Large number of similar nodules in the subcutaneous fat bilaterally. These are most numerous in the perineal regions. Musculoskeletal: Right sacral bone island. L5-S1 degenerative changes. IMPRESSION: 1. Large number of metastases in the subcutaneous fat of the chest, abdomen pelvis. 2. Large number of metastases scattered throughout the peritoneum and retroperitoneum of the abdomen and pelvis. 3. Large number of lung metastases. 4. Mild bilateral axillary metastatic adenopathy. 5. Bilateral adrenal metastases. 6. Cholelithiasis. 7. Uterine fibroids. 8. Right ovarian probable endometrioma or hemorrhagic cyst, based on previous ultrasound findings. 9. 2 mm right lobe thyroid nodule. This is most likely an incidental benign nodule. A metastasis is less likely. Electronically Signed   By: Claudie Revering M.D.   On: 01/26/2018 20:41   Mr [name] VH Contrast  Result Date: 01/26/2018 CLINICAL DATA:  Abnormal head CT. Nausea and vomiting and anemia. Headaches EXAM: MRI HEAD WITHOUT AND WITH CONTRAST TECHNIQUE: Multiplanar, multiecho pulse sequences of the brain and surrounding structures were obtained without and with intravenous contrast. CONTRAST:  64mL MULTIHANCE GADOBENATE DIMEGLUMINE 529 MG/ML IV SOLN COMPARISON:  CT head 01/26/2018 FINDINGS: Brain: Widespread small hemorrhagic lesions are present in the brain. Most these are very small and contain intrinsic T1 shortening due to methemoglobin or melanin. Many lesions also show enhancement suggesting underlying tumor. Largest areas of  hemorrhage include the right occipital lobe measuring approximately 12 mm and right parietal lobe measuring 9 mm. Mild edema around the right parietal lesion which also shows enhancement. Approximately 17 lesions are identified in the brain. These are marked with arrows on the axial T1 post-contrast sequence. Ventricle size normal. No midline shift. No areas of acute infarction. No significant chronic ischemia. Vascular: Normal arterial flow voids Skull and upper cervical spine: No lesions in the calvarium identified. However, there are numerous small dermal lesions throughout the scalp and upper neck bilaterally. Many of these show enhancement and are suspicious for tumor deposits. Sinuses/Orbits: Mucosal edema throughout the paranasal sinuses. Normal orbit Other: None IMPRESSION: Numerous small hemorrhagic lesions throughout the brain many of which show enhancement. Largest lesion in the right parietal lobe shows mild surrounding edema. Findings are highly suspicious for hemorrhagic metastatic disease such as melanoma. In addition, there are numerous enhancing lesions throughout the scalp and skin in the upper neck also suspicious for widespread tumor. These results were called by telephone at the time of interpretation on 01/26/2018 at 4:38 pm to Dr. Duffy Bruce , who verbally acknowledged these results. Electronically Signed   By: Franchot Gallo M.D.   On: 01/26/2018 16:39   Ct Abdomen Pelvis W Contrast  Result Date: 01/26/2018 CLINICAL DATA:  Left mid abdominal pain, nausea and vomiting for the past week. Hematemesis yesterday. Intermittent right breast pain for the past 3 months. Stage IV melanoma. EXAM: CT CHEST, ABDOMEN, AND PELVIS WITH CONTRAST TECHNIQUE: Multidetector CT imaging of the chest, abdomen and pelvis was performed following the standard protocol during bolus administration of intravenous contrast. CONTRAST:  171mL OMNIPAQUE IOHEXOL 300 MG/ML  SOLN COMPARISON:  Abdomen radiographs obtained  earlier today. Pelvic ultrasound dated 12/29/2009. FINDINGS: CT CHEST FINDINGS Cardiovascular: No significant vascular findings. Normal heart size. No pericardial effusion. Mediastinum/Nodes: Mildly enlarged bilateral axillary and retropectoral lymph nodes.  These include a 9 mm short axis right axillary node on image number 14 series 3 and an 8 mm short axis retropectoral node on the left on image number 13 series 3. No enlarged hilar or mediastinal nodes. Thyroid gland, trachea, and esophagus demonstrate no significant findings. There is an incidentally noted 2 mm right lobe thyroid nodule. Lungs/Pleura: Large number of nodules throughout both lungs. The largest on the right is in the inferior aspect of the right upper lobe, measuring 1.5 x 1.0 cm on image number 63 series 5. The largest on the left is a left lower lobe nodule measuring 2.0 x 0.9 cm on image number 86 series 5. No pleural fluid. Musculoskeletal: Large number of small, rounded and oval, circumscribed subcutaneous nodules bilaterally. There are similar lodges in both breasts. The largest is inferior to the right breast laterally, measuring 2.4 x 1.3 cm on image number 43 series 3. Mild thoracic spine degenerative changes. Lower cervical spine degenerative changes. No bony metastases. A T12 hemangioma is noted. CT ABDOMEN PELVIS FINDINGS Hepatobiliary: Multiple noncalcified gallstones in the gallbladder. These are difficult to separate from each other. The largest individual visualized gallstone measures 8 mm. No gallbladder wall thickening or pericholecystic fluid. Unremarkable liver. Pancreas: Unremarkable. No pancreatic ductal dilatation or surrounding inflammatory changes. Spleen: 1.1 cm oval enhancing mass in the central spleen an additional smaller enhancing masses in that region. Adrenals/Urinary Tract: 2 masses in each adrenal gland. These are heterogeneous. These include a 3.7 x 2.8 cm right adrenal mass and 3.4 x 2.7 cm left adrenal mass on  image number 58 of series 3. There is an additional 3.5 x 1.9 cm right adrenal mass and 1.7 x 1.3 cm left adrenal mass on image number 54 series 3. Normal appearing kidneys, ureters and urinary bladder. Stomach/Bowel: Unremarkable stomach, small bowel and colon. No evidence of appendicitis. Vascular/Lymphatic: No significant vascular findings are present. No enlarged abdominal or pelvic lymph nodes. Reproductive: 4.4 x 3.0 cm left myometrial mass. 1.5 x 1.43 cm fundal myometrial mass containing a small calcification. 5.0 x 4.4 cm right ovarian cyst containing low to medium density fluid measuring 26 Hounsfield units in density. Unremarkable left ovary. Other: Large number of small, rounded and oval, circumscribed nodules scattered throughout the abdominal and pelvic peritoneal cavity and retroperitoneum. Large number of similar nodules in the subcutaneous fat bilaterally. These are most numerous in the perineal regions. Musculoskeletal: Right sacral bone island. L5-S1 degenerative changes. IMPRESSION: 1. Large number of metastases in the subcutaneous fat of the chest, abdomen pelvis. 2. Large number of metastases scattered throughout the peritoneum and retroperitoneum of the abdomen and pelvis. 3. Large number of lung metastases. 4. Mild bilateral axillary metastatic adenopathy. 5. Bilateral adrenal metastases. 6. Cholelithiasis. 7. Uterine fibroids. 8. Right ovarian probable endometrioma or hemorrhagic cyst, based on previous ultrasound findings. 9. 2 mm right lobe thyroid nodule. This is most likely an incidental benign nodule. A metastasis is less likely. Electronically Signed   By: Claudie Revering M.D.   On: 01/26/2018 20:41   Dg Abd 2 Views  Result Date: 01/26/2018 CLINICAL DATA:  Nausea and vomiting. EXAM: ABDOMEN - 2 VIEW COMPARISON:  None. FINDINGS: The bowel gas pattern is normal. There is no evidence of free air. No radio-opaque calculi or other significant radiographic abnormality is seen. Probable bone  island in the right sacral ala. No acute osseous abnormality. IMPRESSION: Negative. Electronically Signed   By: Titus Dubin M.D.   On: 01/26/2018 14:01   [place] Core  Biopsy (soft Tissue)  Result Date: 01/27/2018 INDICATION: Melanoma, cutaneous metastases EXAM: Ultrasound right chest wall subcutaneous mass core biopsy MEDICATIONS: 1% lidocaine local ANESTHESIA/SEDATION: Moderate (conscious) sedation was employed during this procedure. A total of Versed 2.0 mg and Fentanyl 50 mcg was administered intravenously. Moderate Sedation Time: 10 minutes. The patient's level of consciousness and vital signs were monitored continuously by radiology nursing throughout the procedure under my direct supervision. FLUOROSCOPY TIME:  Fluoroscopy Time: None. COMPLICATIONS: None immediate. PROCEDURE: Informed written consent was obtained from the patient after a thorough discussion of the procedural risks, benefits and alternatives. All questions were addressed. Maximal Sterile Barrier Technique was utilized including caps, mask, sterile gowns, sterile gloves, sterile drape, hand hygiene and skin antiseptic. A timeout was performed prior to the initiation of the procedure. Previous imaging reviewed. Preliminary ultrasound performed. The right lateral chest 2 cm subcutaneous nodule by chest CT was localized with ultrasound just inferior to the right breast. Overlying skin marked. Under sterile conditions and local anesthesia, an 18 gauge core biopsy was advanced to the lesion. Needle position confirmed with ultrasound. 18 gauge core biopsies obtained (4). Samples placed in formalin. Needle removed. Postprocedure imaging demonstrates no hemorrhage or hematoma. Patient tolerated the biopsy well. IMPRESSION: Successful ultrasound right lateral chest wall subcutaneous mass core biopsies Electronically Signed   By: Jerilynn Mages.  Shick M.D.   On: 01/27/2018 12:06      IMPRESSION/PLAN: 1. 47 y.o. with newly diagnosed diffuse metastatic disease  with 17 lesions in the brain of unknown primary but suspect melanoma. She had an U/S guided core needle biopsy of a right chest wall subcutaneous nodule earlier today- path pending. At this point, the patient would potentially benefit from radiotherapy. The options include whole brain irradiation versus stereotactic radiosurgery. There are pros and cons associated with each of these potential treatment options. Whole brain radiotherapy would treat the known metastatic deposits and help provide some reduction of risk for future brain metastases. However, whole brain radiotherapy carries potential risks including hair loss, subacute somnolence, and neurocognitive changes including a possible reduction in short-term memory. Whole brain radiotherapy also may carry a lower likelihood of tumor control at the treatment sites because of the low-dose used. Stereotactic radiosurgery carries a higher likelihood for local tumor control at the targeted sites with lower associated risk for neurocognitive changes such as memory loss. However, the use of stereotactic radiosurgery in this setting may leave the patient at increased risk for new brain metastases elsewhere in the brain as high as 50-60%. Accordingly, patients who receive stereotactic radiosurgery in this setting should undergo ongoing surveillance imaging with brain MRI more frequently in order to identify and treat new small brain metastases before they become symptomatic. Stereotactic radiosurgery does carry some different risks, including a risk of radionecrosis.  PLAN: Her case has been reviewed with Dr. Tammi Klippel who has personally reviewed her imaging and work up thus far. Today, I reviewed the findings and workup thus far with the patient.  We discussed the dilemma regarding whole brain radiotherapy versus stereotactic radiosurgery. We discussed the pros and cons of each. We also discussed the logistics and delivery of each. We reviewed the results associated  with each of the treatments described above and at this point, it appears that she is a better candidate for whole brain radiotherapy. We will tentatively schedule CT SIM planning for Tuesday 01/31/18 at 11am in anticipation of beginning treatment in the near future.  Final treatment recommendation will depend on pathology and further imaging evaluation  with 3T MRI brain. We agree with continuation of seizure prophylaxis with Keppra as well as Decadron for symptom management. Will await final pathology and follow up at that time to further discuss treatment plan.  I spent 60 minutes minutes face to face with the patient and more than 50% of that time was spent in counseling and/or coordination of care.    Nicholos Johns, PA-C    Tyler Pita, MD  [facility] Direct Dial: [phone]  Fax: [phone] [email or web address]  Skype  LinkedIn

## 2018-01-27 NOTE — Consult Note (Signed)
Reason for the consult: Melanoma  HPI: 47 year old woman native of [place] but currently lives in [place].  She is in reasonable health without any significant comorbid conditions.  She presented to the emergency department last night with complaints of excessive fatigue and tiredness in addition to coffee-ground emesis.  Her evaluation at that time also revealed a subcutaneous nodules on her scalp along with multiple areas of bulky adenopathy on exam.  She underwent MRI of the brain which showed numerous hemorrhagic lesions throughout the brain the largest shows surrounding edema suspicious for metastatic disease.  Imaging studies of the chest abdomen and pelvis showed diffuse metastatic disease including subcutaneous nodules in the chest abdomen and pelvis, retroperitoneal involvement as well as lung metastasis and bilateral axillary adenopathy.  She underwent a liver biopsy that was completed on 01/27/2018.  Clinically, she reports feeling fatigue and tired with occasional headaches.  She denies any alteration in mental status or seizures.  She does report nausea and vomiting and her appetite has been poor.  Her performance status however has not declined dramatically and remained reasonable.  He did report hematemesis.  She does not report any  blurry vision, syncope or seizures. Does not report any fevers, chills or sweats.  Does not report any cough, wheezing or hemoptysis.  Does not report any chest pain, palpitation, orthopnea or leg edema.  Does not report any constipation or diarrhea.  Does not report any skeletal complaints.    Does not report frequency, urgency or hematuria.  Does not report any skin rashes or lesions. Does not report any heat or cold intolerance.  Does not report any bleeding tendency or petechiae.  Does not report any anxiety or depression.  Remaining review of systems is negative.    Past Medical History:  Diagnosis Date  . Anemia   . Anemia   . Diabetes mellitus  without complication (HCC)    prediabetic  . Seasonal allergies   :  Past Surgical History:  Procedure Laterality Date  . MOUTH SURGERY    . OVARY SURGERY     removed "something"  :   Current Facility-Administered Medications:  .  dexamethasone (DECADRON) injection 4 mg, 4 mg, Intravenous, Q6H, Dana Allan I, MD, 4 mg at 01/27/18 0523 .  fentaNYL (SUBLIMAZE) 100 MCG/2ML injection, , , ,  .  levETIRAcetam (KEPPRA) IVPB 1000 mg/100 mL premix, 1,000 mg, Intravenous, Q12H, Bonnell Public, MD, Stopped at 01/27/18 1149 .  lidocaine (XYLOCAINE) 1 % (with pres) injection, , , ,  .  loratadine (CLARITIN) tablet 10 mg, 10 mg, Oral, Daily, Dana Allan I, MD, 10 mg at 01/27/18 1033 .  midazolam (VERSED) 2 MG/2ML injection, , , ,  .  multivitamin with minerals tablet 1 tablet, 1 tablet, Oral, Daily, Dana Allan I, MD, 1 tablet at 01/27/18 1033 .  pantoprazole (PROTONIX) injection 40 mg, 40 mg, Intravenous, Q12H, Emokpae, Ejiroghene E, MD, 40 mg at 01/27/18 1033:  No Known Allergies:  Family History  Problem Relation Age of Onset  . Hypertension Father   . Breast cancer Maternal Aunt   . Diabetes Maternal Aunt   . Breast cancer Paternal Aunt   . Breast cancer Maternal Grandmother   . Breast cancer Paternal Grandmother   . Diabetes Paternal Grandmother   :  Social History   Socioeconomic History  . Marital status: Single    Spouse name: Not on file  . Number of children: Not on file  . Years of education: Not on file  .  Highest education level: Not on file  Occupational History  . Not on file  Social Needs  . Financial resource strain: Not on file  . Food insecurity:    Worry: Not on file    Inability: Not on file  . Transportation needs:    Medical: Not on file    Non-medical: Not on file  Tobacco Use  . Smoking status: Never Smoker  . Smokeless tobacco: Never Used  Substance and Sexual Activity  . Alcohol use: No  . Drug use: No  . Sexual  activity: Not Currently  Lifestyle  . Physical activity:    Days per week: Not on file    Minutes per session: Not on file  . Stress: Not on file  Relationships  . Social connections:    Talks on phone: Not on file    Gets together: Not on file    Attends religious service: Not on file    Active member of club or organization: Not on file    Attends meetings of clubs or organizations: Not on file    Relationship status: Not on file  . Intimate partner violence:    Fear of current or ex partner: Not on file    Emotionally abused: Not on file    Physically abused: Not on file    Forced sexual activity: Not on file  Other Topics Concern  . Not on file  Social History Narrative  . Not on file  :  Pertinent items are noted in HPI.  Exam: Blood pressure 114/68, pulse 92, temperature 98.6 F (37 C), temperature source Oral, resp. rate 16, height '5\' 1"'$  (1.549 m), weight 162 lb (73.5 kg), last menstrual period 01/15/2018, SpO2 98 %.   General appearance: alert and cooperative appeared without distress. Head: Atraumatic with subcutaneous nodules noted around the hairline. Eyes: conjunctivae/corneas clear. PERRL.  Sclera anicteric. Throat: lips, mucosa, and tongue normal; without oral thrush or ulcers. Resp: clear to auscultation bilaterally without rhonchi, wheezes or dullness to percussion. Cardio: regular rate and rhythm, S1, S2 normal, no murmur, click, rub or gallop GI: soft, non-tender; bowel sounds normal; no masses,  no organomegaly Skin: Skin color, texture, turgor normal. No rashes or lesions Lymph nodes: Bilateral axillary adenopathy palpated left more than right. Neurologic: Grossly normal without any motor, sensory or deep tendon reflexes. Musculoskeletal: No joint deformity or effusion.  Recent Labs    01/26/18 1227 01/27/18 0650 01/27/18 1007  WBC 7.6 7.0  --   HGB 9.8* 9.5* 9.8*  HCT 33.3* 30.3* 31.5*  PLT 131* 133*  --    Recent Labs    01/26/18 1227  01/27/18 0650  NA 139 137  K 4.1 4.1  CL 106 106  CO2 25 23  GLUCOSE 145* 144*  BUN 19 20  CREATININE 0.77 0.56  CALCIUM 9.2 8.7*      Ct Chest W Contrast  Result Date: 01/26/2018 CLINICAL DATA:  Left mid abdominal pain, nausea and vomiting for the past week. Hematemesis yesterday. Intermittent right breast pain for the past 3 months. Stage IV melanoma. EXAM: CT CHEST, ABDOMEN, AND PELVIS WITH CONTRAST TECHNIQUE: Multidetector CT imaging of the chest, abdomen and pelvis was performed following the standard protocol during bolus administration of intravenous contrast. CONTRAST:  165m OMNIPAQUE IOHEXOL 300 MG/ML  SOLN  Mr BJeri CosWo Contrast  Result Date: 01/26/2018 CLINICAL DATA:  Abnormal head CT. Nausea and vomiting and anemia. Headaches EXAM: MRI HEAD WITHOUT AND WITH CONTRAST TECHNIQUE: Multiplanar, multiecho pulse  sequences of the brain and surrounding structures were obtained without and with intravenous contrast. CONTRAST:  1m MULTIHANCE GADOBENATE DIMEGLUMINE 529 MG/ML IV SOLN COMPARISON:  CT head 01/26/2018 FINDINGS: Brain: Widespread small hemorrhagic lesions are present in the brain. Most these are very small and contain intrinsic T1 shortening due to methemoglobin or melanin. Many lesions also show enhancement suggesting underlying tumor. Largest areas of hemorrhage include the right occipital lobe measuring approximately 12 mm and right parietal lobe measuring 9 mm. Mild edema around the right parietal lesion which also shows enhancement. Approximately 17 lesions are identified in the brain. These are marked with arrows on the axial T1 post-contrast sequence. Ventricle size normal. No midline shift. No areas of acute infarction. No significant chronic ischemia. Vascular: Normal arterial flow voids Skull and upper cervical spine: No lesions in the calvarium identified. However, there are numerous small dermal lesions throughout the scalp and upper neck bilaterally. Many of these show  enhancement and are suspicious for tumor deposits. Sinuses/Orbits: Mucosal edema throughout the paranasal sinuses. Normal orbit Other: None IMPRESSION: Numerous small hemorrhagic lesions throughout the brain many of which show enhancement. Largest lesion in the right parietal lobe shows mild surrounding edema. Findings are highly suspicious for hemorrhagic metastatic disease such as melanoma. In addition, there are numerous enhancing lesions throughout the scalp and skin in the upper neck also suspicious for widespread tumor. These results were called by telephone at the time of interpretation on 01/26/2018 at 4:38 pm to Dr. CDuffy Bruce, who verbally acknowledged these results. Electronically Signed   By: CFranchot GalloM.D.   On: 01/26/2018 16:39   Ct Abdomen Pelvis W Contrast  Result Date: 01/26/2018 CLINICAL DATA:  Left mid abdominal pain, nausea and vomiting for the past week. Hematemesis yesterday. Intermittent right breast pain for the past 3 months. Stage IV melanoma. EXAM: CT CHEST, ABDOMEN, AND PELVIS WITH CONTRAST TECHNIQUE: Multidetector CT imaging of the chest, abdomen and pelvis was performed following the standard protocol during bolus administration of intravenous contrast. CONTRAST:  1059mOMNIPAQUE IOHEXOL 300 MG/ML  SOLN COMPARISON:  Abdomen radiographs obtained earlier today. Pelvic ultrasound dated 12/29/2009. FINDINGS: CT CHEST FINDINGS Cardiovascular: No significant vascular findings. Normal heart size. No pericardial effusion. Mediastinum/Nodes: Mildly enlarged bilateral axillary and retropectoral lymph nodes. These include a 9 mm short axis right axillary node on image number 14 series 3 and an 8 mm short axis retropectoral node on the left on image number 13 series 3. No enlarged hilar or mediastinal nodes. Thyroid gland, trachea, and esophagus demonstrate no significant findings. There is an incidentally noted 2 mm right lobe thyroid nodule. Lungs/Pleura: Large number of nodules  throughout both lungs. The largest on the right is in the inferior aspect of the right upper lobe, measuring 1.5 x 1.0 cm on image number 63 series 5. The largest on the left is a left lower lobe nodule measuring 2.0 x 0.9 cm on image number 86 series 5. No pleural fluid. Musculoskeletal: Large number of small, rounded and oval, circumscribed subcutaneous nodules bilaterally. There are similar lodges in both breasts. The largest is inferior to the right breast laterally, measuring 2.4 x 1.3 cm on image number 43 series 3. Mild thoracic spine degenerative changes. Lower cervical spine degenerative changes. No bony metastases. A T12 hemangioma is noted. CT ABDOMEN PELVIS FINDINGS Hepatobiliary: Multiple noncalcified gallstones in the gallbladder. These are difficult to separate from each other. The largest individual visualized gallstone measures 8 mm. No gallbladder wall thickening or pericholecystic fluid.  Unremarkable liver. Pancreas: Unremarkable. No pancreatic ductal dilatation or surrounding inflammatory changes. Spleen: 1.1 cm oval enhancing mass in the central spleen an additional smaller enhancing masses in that region. Adrenals/Urinary Tract: 2 masses in each adrenal gland. These are heterogeneous. These include a 3.7 x 2.8 cm right adrenal mass and 3.4 x 2.7 cm left adrenal mass on image number 58 of series 3. There is an additional 3.5 x 1.9 cm right adrenal mass and 1.7 x 1.3 cm left adrenal mass on image number 54 series 3. Normal appearing kidneys, ureters and urinary bladder. Stomach/Bowel: Unremarkable stomach, small bowel and colon. No evidence of appendicitis. Vascular/Lymphatic: No significant vascular findings are present. No enlarged abdominal or pelvic lymph nodes. Reproductive: 4.4 x 3.0 cm left myometrial mass. 1.5 x 1.43 cm fundal myometrial mass containing a small calcification. 5.0 x 4.4 cm right ovarian cyst containing low to medium density fluid measuring 26 Hounsfield units in density.  Unremarkable left ovary. Other: Large number of small, rounded and oval, circumscribed nodules scattered throughout the abdominal and pelvic peritoneal cavity and retroperitoneum. Large number of similar nodules in the subcutaneous fat bilaterally. These are most numerous in the perineal regions. Musculoskeletal: Right sacral bone island. L5-S1 degenerative changes. IMPRESSION: 1. Large number of metastases in the subcutaneous fat of the chest, abdomen pelvis. 2. Large number of metastases scattered throughout the peritoneum and retroperitoneum of the abdomen and pelvis. 3. Large number of lung metastases. 4. Mild bilateral axillary metastatic adenopathy. 5. Bilateral adrenal metastases. 6. Cholelithiasis. 7. Uterine fibroids. 8. Right ovarian probable endometrioma or hemorrhagic cyst, based on previous ultrasound findings. 9. 2 mm right lobe thyroid nodule. This is most likely an incidental benign nodule. A metastasis is less likely. Electronically Signed   By: Claudie Revering M.D.   On: 01/26/2018 20:41       Assessment and Plan:   47 year old woman with the following:  1.  Advanced malignancy with widespread disease including subcutaneous nodules, retroperitoneal and adrenal nodules.  She also has documented CNS metastasis.  She underwent a liver guided biopsy with results are currently pending.  The differential diagnosis was discussed today with the patient and the pattern is suspicious for metastatic melanoma.  She does have a family history of melanoma but no personal history documented as far she can tell.  She did have a skin lesion removed from her back in 2014 while she is living in [place].  From a management standpoint, we will have to await the results of the biopsy before further recommendations.  I have asked Dr. Tamala Julian from dermatopathology to evaluate the specimen urgently and molecular testing for BRAF will be obtained in the immediate future.  If her tumor is indeed melanoma that is  being BRAF positive, oral targeted therapy will be started in the near future and attempt to palliate her disease effectively.  These agents have high response rate although the response is not durable.  Immunotherapy could also be an option to treat her disease.  I will update the patient with the results as soon they are available.  2.  CNS metastasis: Given the diffuse nature of her disease she will require whole brain radiation.  She is already on dexamethasone.  3.  GI bleed: I recommend proceeding with endoscopy with high likelihood of malignant ulcer causing her active bleeding.  4.  Prognosis: Despite her age, her prognosis is poor and any treatment is palliative.  However, aggressive measures may be required at this time until we fully  identify the nature of her disease and potential treatment options.  I will continue to check on her periodically as the results of her biopsy become available.  80  minutes was spent with the patient face-to-face today.  More than 50% of time was dedicated to patient counseling, education and discussing the differential diagnosis and future treatment options.

## 2018-01-27 NOTE — Procedures (Signed)
Cutaneous nodules h/p melanoma  S/p [place] RT CHEST WALL CUTANEOUS NODULE CORE BX  No comp Stable EBL min Path pending Full report in pacs

## 2018-01-27 NOTE — Consult Note (Signed)
[facility] CONSULT Reason for consult: Hematemesis Referring Physician: [facility] hospitalist.  PCP: Beryle Beams NP  Kaitlyn Duncan is an 47 y.o. female.  HPI: She is recently moved to [place].  She has a history of a malignant mole on her right upper back that was removed approximately 5 years ago while living in [place].  Do not have any further information about it.  She has been nauseated recently and his vomited on a couple of occasions she thought she was vomiting blood or dark coffee-like material.  Her hemoglobin on admission was 9.8 and her stools were guaiac positive.  At this point she has had CT scanOf the head, abdomen and an MRI of the brain as well showing diffuse metastatic disease.  She underwent a guided liver biopsy today results still pending.  She has been seen by oncology.  At this point the primary source of what appears to be diffuse metastatic disease has not been determined.  The patient denies ever having had an ulcer, taking any acid reducing medication, symptoms of dyspepsia or any significant use of NSAIDs.  She has not ever had colonoscopy.  Past Medical History:  Diagnosis Date  . Anemia   . Anemia   . Diabetes mellitus without complication (HCC)    prediabetic  . Seasonal allergies     Past Surgical History:  Procedure Laterality Date  . MOUTH SURGERY    . OVARY SURGERY     removed "something"    Family History  Problem Relation Age of Onset  . Hypertension Father   . Breast cancer Maternal Aunt   . Diabetes Maternal Aunt   . Breast cancer Paternal Aunt   . Breast cancer Maternal Grandmother   . Breast cancer Paternal Grandmother   . Diabetes Paternal Grandmother     Social History:  reports that she has never smoked. She has never used smokeless tobacco. She reports that she does not drink alcohol or use drugs.  Allergies: No Known Allergies  Medications; Prior to Admission medications   Medication Sig Start Date End Date  Taking? Authorizing Provider  acetaminophen (TYLENOL) 325 MG tablet Take 650 mg by mouth every 6 (six) hours as needed.   Yes [provider]  ibuprofen (ADVIL,MOTRIN) 100 MG tablet Take 200 mg by mouth every 6 (six) hours as needed for pain or fever.    Yes [provider]  loratadine (CLARITIN) 10 MG tablet Take 10 mg by mouth daily.   Yes [provider]  Multiple Vitamin (MULTIVITAMIN WITH MINERALS) TABS tablet Take 1 tablet by mouth daily.   Yes [provider]   . dexamethasone  4 mg Intravenous Q6H  . fentaNYL      . lidocaine      . loratadine  10 mg Oral Daily  . midazolam      . multivitamin with minerals  1 tablet Oral Daily  . pantoprazole (PROTONIX) IV  40 mg Intravenous Q12H   PRN Meds  Results for orders placed or performed during the hospital encounter of 01/26/18 (from the past 48 hour(s))  Comprehensive metabolic panel     Status: Abnormal   Collection Time: 01/26/18 12:27 PM  Result Value Ref Range   Sodium 139 135 - 145 mmol/L   Potassium 4.1 3.5 - 5.1 mmol/L   Chloride 106 98 - 111 mmol/L   CO2 25 22 - 32 mmol/L   Glucose, Bld 145 (H) 70 - 99 mg/dL   BUN 19 6 - 20  mg/dL   Creatinine, Ser 0.77 0.44 - 1.00 mg/dL   Calcium 9.2 8.9 - 10.3 mg/dL   Total Protein 7.1 6.5 - 8.1 g/dL   Albumin 3.8 3.5 - 5.0 g/dL   AST 26 15 - 41 U/L   ALT 14 0 - 44 U/L   Alkaline Phosphatase 79 38 - 126 U/L   Total Bilirubin 0.6 0.3 - 1.2 mg/dL   GFR calc non Af Amer >60 >60 mL/min   GFR calc Af Amer >60 >60 mL/min    Comment: (NOTE) The eGFR has been calculated using the CKD EPI equation. This calculation has not been validated in all clinical situations. eGFR's persistently <60 mL/min signify possible Chronic Kidney Disease.    Anion gap 8 5 - 15    Comment: Performed at [facility] [address] [place], [place] [address]  CBC     Status: Abnormal   Collection Time: 01/26/18 12:27 PM  Result Value Ref Range   WBC 7.6 4.0 -  10.5 K/uL   RBC 3.99 3.87 - 5.11 MIL/uL   Hemoglobin 9.8 (L) 12.0 - 15.0 g/dL   HCT 33.3 (L) 36.0 - 46.0 %   MCV 83.5 78.0 - 100.0 fL   MCH 24.6 (L) 26.0 - 34.0 pg   MCHC 29.4 (L) 30.0 - 36.0 g/dL   RDW 14.4 11.5 - 15.5 %   Platelets 131 (L) 150 - 400 K/uL    Comment: Performed at [facility], [place] [address] [place], [place] [address]  Type and screen [place]     Status: None   Collection Time: 01/26/18 12:27 PM  Result Value Ref Range   ABO/RH(D) A POS    Antibody Screen NEG    Sample Expiration      01/29/2018 Performed at [facility], [place] [address], [place], [place] [address]   ABO/Rh     Status: None   Collection Time: 01/26/18 12:27 PM  Result Value Ref Range   ABO/RH(D)      A POS Performed at [facility] [address] [place], [place] [address]   I-Stat beta hCG blood, ED     Status: None   Collection Time: 01/26/18 12:37 PM  Result Value Ref Range   I-stat hCG, quantitative <5.0 <5 mIU/mL   Comment 3            Comment:   GEST. AGE      CONC.  (mIU/mL)   <=1 WEEK        5 - 50     2 WEEKS       50 - 500     3 WEEKS       100 - 10,000     4 WEEKS     1,000 - 30,000        FEMALE AND NON-PREGNANT FEMALE:     LESS THAN 5 mIU/mL   POC occult blood, ED     Status: Abnormal   Collection Time: 01/26/18  1:38 PM  Result Value Ref Range   Fecal Occult Bld POSITIVE (A) NEGATIVE  Glucose, capillary     Status: Abnormal   Collection Time: 01/26/18 10:50 PM  Result Value Ref Range   Glucose-Capillary 158 (H) 70 - 99 mg/dL  Glucose, capillary     Status: Abnormal   Collection Time: 01/27/18  5:21 AM  Result Value Ref Range   Glucose-Capillary 144 (H) 70 - 99 mg/dL  Basic metabolic panel     Status: Abnormal   Collection Time: 01/27/18  6:50 AM  Result Value Ref Range   Sodium 137 135 - 145 mmol/L   Potassium 4.1 3.5 - 5.1 mmol/L   Chloride 106 98 - 111 mmol/L   CO2 23 22 - 32 mmol/L   Glucose, Bld 144 (H) 70 - 99 mg/dL    BUN 20 6 - 20 mg/dL   Creatinine, Ser 0.56 0.44 - 1.00 mg/dL   Calcium 8.7 (L) 8.9 - 10.3 mg/dL   GFR calc non Af Amer >60 >60 mL/min   GFR calc Af Amer >60 >60 mL/min    Comment: (NOTE) The eGFR has been calculated using the CKD EPI equation. This calculation has not been validated in all clinical situations. eGFR's persistently <60 mL/min signify possible Chronic Kidney Disease.    Anion gap 8 5 - 15    Comment: Performed at [facility], [place] [address], [place], [place] [address]  CBC     Status: Abnormal   Collection Time: 01/27/18  6:50 AM  Result Value Ref Range   WBC 7.0 4.0 - 10.5 K/uL   RBC 3.74 (L) 3.87 - 5.11 MIL/uL   Hemoglobin 9.5 (L) 12.0 - 15.0 g/dL   HCT 30.3 (L) 36.0 - 46.0 %   MCV 81.0 78.0 - 100.0 fL   MCH 25.4 (L) 26.0 - 34.0 pg   MCHC 31.4 30.0 - 36.0 g/dL   RDW 14.5 11.5 - 15.5 %   Platelets 133 (L) 150 - 400 K/uL    Comment: Performed at [facility], [place] [address] [place], [place] [address]  Hemoglobin and hematocrit, blood     Status: Abnormal   Collection Time: 01/27/18 10:07 AM  Result Value Ref Range   Hemoglobin 9.8 (L) 12.0 - 15.0 g/dL   HCT 31.5 (L) 36.0 - 46.0 %    Comment: Performed at [facility], [place] [address], [place], [place] [address]  Protime-INR     Status: None   Collection Time: 01/27/18 10:07 AM  Result Value Ref Range   Prothrombin Time 13.2 11.4 - 15.2 seconds   INR 1.01     Comment: Performed at [facility], [place] [address], [place], [place] [address]  Glucose, capillary     Status: None   Collection Time: 01/27/18 12:29 PM  Result Value Ref Range   Glucose-Capillary 97 70 - 99 mg/dL  Glucose, capillary     Status: Abnormal   Collection Time: 01/27/18  5:07 PM  Result Value Ref Range   Glucose-Capillary 230 (H) 70 - 99 mg/dL    Ct Head Wo Contrast  Result Date: 01/26/2018 CLINICAL DATA:  Nausea and vomiting for 1 week EXAM: CT HEAD WITHOUT  CONTRAST TECHNIQUE: Contiguous axial images were obtained from the base of the skull through the vertex without intravenous contrast. COMPARISON:  None. FINDINGS: Brain: There is an ill-defined hyperdense focus in the medial right parietal lobe measuring approximately 1 cm. There is no surrounding vasogenic edema. There is no mass effect or midline shift. Ventricular system and extra-axial space are within normal limits. Vascular: No hyperdense vessel or unexpected calcification. Skull: The cranium is intact. Sinuses/Orbits: There is opacification of the left maxillary sinus with postoperative changes. Small mucous retention cyst in the right maxillary sinus. Nasal septum is deviated to the left. Orbits are within normal limits. Other: Noncontributory IMPRESSION: There is a hyperdense focus in the right parietal lobe.  Differential diagnosis includes a hyperdense mass, cavernous angioma, or subacute hemorrhage. Initially, CT head with contrast may be helpful. Ultimately, brain MRI may be required. Electronically Signed   By: Marybelle Killings M.D.   On: 01/26/2018 14:10   Ct Chest W Contrast  Result Date: 01/26/2018 CLINICAL DATA:  Left mid abdominal pain, nausea and vomiting for the past week. Hematemesis yesterday. Intermittent right breast pain for the past 3 months. Stage IV melanoma. EXAM: CT CHEST, ABDOMEN, AND PELVIS WITH CONTRAST TECHNIQUE: Multidetector CT imaging of the chest, abdomen and pelvis was performed following the standard protocol during bolus administration of intravenous contrast. CONTRAST:  151m OMNIPAQUE IOHEXOL 300 MG/ML  SOLN COMPARISON:  Abdomen radiographs obtained earlier today. Pelvic ultrasound dated 12/29/2009. FINDINGS: CT CHEST FINDINGS Cardiovascular: No significant vascular findings. Normal heart size. No pericardial effusion. Mediastinum/Nodes: Mildly enlarged bilateral axillary and retropectoral lymph nodes. These include a 9 mm short axis right axillary node on image number 14  series 3 and an 8 mm short axis retropectoral node on the left on image number 13 series 3. No enlarged hilar or mediastinal nodes. Thyroid gland, trachea, and esophagus demonstrate no significant findings. There is an incidentally noted 2 mm right lobe thyroid nodule. Lungs/Pleura: Large number of nodules throughout both lungs. The largest on the right is in the inferior aspect of the right upper lobe, measuring 1.5 x 1.0 cm on image number 63 series 5. The largest on the left is a left lower lobe nodule measuring 2.0 x 0.9 cm on image number 86 series 5. No pleural fluid. Musculoskeletal: Large number of small, rounded and oval, circumscribed subcutaneous nodules bilaterally. There are similar lodges in both breasts. The largest is inferior to the right breast laterally, measuring 2.4 x 1.3 cm on image number 43 series 3. Mild thoracic spine degenerative changes. Lower cervical spine degenerative changes. No bony metastases. A T12 hemangioma is noted. CT ABDOMEN PELVIS FINDINGS Hepatobiliary: Multiple noncalcified gallstones in the gallbladder. These are difficult to separate from each other. The largest individual visualized gallstone measures 8 mm. No gallbladder wall thickening or pericholecystic fluid. Unremarkable liver. Pancreas: Unremarkable. No pancreatic ductal dilatation or surrounding inflammatory changes. Spleen: 1.1 cm oval enhancing mass in the central spleen an additional smaller enhancing masses in that region. Adrenals/Urinary Tract: 2 masses in each adrenal gland. These are heterogeneous. These include a 3.7 x 2.8 cm right adrenal mass and 3.4 x 2.7 cm left adrenal mass on image number 58 of series 3. There is an additional 3.5 x 1.9 cm right adrenal mass and 1.7 x 1.3 cm left adrenal mass on image number 54 series 3. Normal appearing kidneys, ureters and urinary bladder. Stomach/Bowel: Unremarkable stomach, small bowel and colon. No evidence of appendicitis. Vascular/Lymphatic: No significant  vascular findings are present. No enlarged abdominal or pelvic lymph nodes. Reproductive: 4.4 x 3.0 cm left myometrial mass. 1.5 x 1.43 cm fundal myometrial mass containing a small calcification. 5.0 x 4.4 cm right ovarian cyst containing low to medium density fluid measuring 26 Hounsfield units in density. Unremarkable left ovary. Other: Large number of small, rounded and oval, circumscribed nodules scattered throughout the abdominal and pelvic peritoneal cavity and retroperitoneum. Large number of similar nodules in the subcutaneous fat bilaterally. These are most numerous in the perineal regions. Musculoskeletal: Right sacral bone island. L5-S1 degenerative changes. IMPRESSION: 1. Large number of metastases in the subcutaneous fat of the chest, abdomen pelvis. 2. Large number of metastases scattered throughout the peritoneum and retroperitoneum of the  abdomen and pelvis. 3. Large number of lung metastases. 4. Mild bilateral axillary metastatic adenopathy. 5. Bilateral adrenal metastases. 6. Cholelithiasis. 7. Uterine fibroids. 8. Right ovarian probable endometrioma or hemorrhagic cyst, based on previous ultrasound findings. 9. 2 mm right lobe thyroid nodule. This is most likely an incidental benign nodule. A metastasis is less likely. Electronically Signed   By: Claudie Revering M.D.   On: 01/26/2018 20:41   Mr [name] EL Contrast  Result Date: 01/26/2018 CLINICAL DATA:  Abnormal head CT. Nausea and vomiting and anemia. Headaches EXAM: MRI HEAD WITHOUT AND WITH CONTRAST TECHNIQUE: Multiplanar, multiecho pulse sequences of the brain and surrounding structures were obtained without and with intravenous contrast. CONTRAST:  65m MULTIHANCE GADOBENATE DIMEGLUMINE 529 MG/ML IV SOLN COMPARISON:  CT head 01/26/2018 FINDINGS: Brain: Widespread small hemorrhagic lesions are present in the brain. Most these are very small and contain intrinsic T1 shortening due to methemoglobin or melanin. Many lesions also show  enhancement suggesting underlying tumor. Largest areas of hemorrhage include the right occipital lobe measuring approximately 12 mm and right parietal lobe measuring 9 mm. Mild edema around the right parietal lesion which also shows enhancement. Approximately 17 lesions are identified in the brain. These are marked with arrows on the axial T1 post-contrast sequence. Ventricle size normal. No midline shift. No areas of acute infarction. No significant chronic ischemia. Vascular: Normal arterial flow voids Skull and upper cervical spine: No lesions in the calvarium identified. However, there are numerous small dermal lesions throughout the scalp and upper neck bilaterally. Many of these show enhancement and are suspicious for tumor deposits. Sinuses/Orbits: Mucosal edema throughout the paranasal sinuses. Normal orbit Other: None IMPRESSION: Numerous small hemorrhagic lesions throughout the brain many of which show enhancement. Largest lesion in the right parietal lobe shows mild surrounding edema. Findings are highly suspicious for hemorrhagic metastatic disease such as melanoma. In addition, there are numerous enhancing lesions throughout the scalp and skin in the upper neck also suspicious for widespread tumor. These results were called by telephone at the time of interpretation on 01/26/2018 at 4:38 pm to Dr. CDuffy Bruce, who verbally acknowledged these results. Electronically Signed   By: CFranchot GalloM.D.   On: 01/26/2018 16:39   Ct Abdomen Pelvis W Contrast  Result Date: 01/26/2018 CLINICAL DATA:  Left mid abdominal pain, nausea and vomiting for the past week. Hematemesis yesterday. Intermittent right breast pain for the past 3 months. Stage IV melanoma. EXAM: CT CHEST, ABDOMEN, AND PELVIS WITH CONTRAST TECHNIQUE: Multidetector CT imaging of the chest, abdomen and pelvis was performed following the standard protocol during bolus administration of intravenous contrast. CONTRAST:  1066mOMNIPAQUE IOHEXOL  300 MG/ML  SOLN COMPARISON:  Abdomen radiographs obtained earlier today. Pelvic ultrasound dated 12/29/2009. FINDINGS: CT CHEST FINDINGS Cardiovascular: No significant vascular findings. Normal heart size. No pericardial effusion. Mediastinum/Nodes: Mildly enlarged bilateral axillary and retropectoral lymph nodes. These include a 9 mm short axis right axillary node on image number 14 series 3 and an 8 mm short axis retropectoral node on the left on image number 13 series 3. No enlarged hilar or mediastinal nodes. Thyroid gland, trachea, and esophagus demonstrate no significant findings. There is an incidentally noted 2 mm right lobe thyroid nodule. Lungs/Pleura: Large number of nodules throughout both lungs. The largest on the right is in the inferior aspect of the right upper lobe, measuring 1.5 x 1.0 cm on image number 63 series 5. The largest on the left is a left lower lobe nodule  measuring 2.0 x 0.9 cm on image number 86 series 5. No pleural fluid. Musculoskeletal: Large number of small, rounded and oval, circumscribed subcutaneous nodules bilaterally. There are similar lodges in both breasts. The largest is inferior to the right breast laterally, measuring 2.4 x 1.3 cm on image number 43 series 3. Mild thoracic spine degenerative changes. Lower cervical spine degenerative changes. No bony metastases. A T12 hemangioma is noted. CT ABDOMEN PELVIS FINDINGS Hepatobiliary: Multiple noncalcified gallstones in the gallbladder. These are difficult to separate from each other. The largest individual visualized gallstone measures 8 mm. No gallbladder wall thickening or pericholecystic fluid. Unremarkable liver. Pancreas: Unremarkable. No pancreatic ductal dilatation or surrounding inflammatory changes. Spleen: 1.1 cm oval enhancing mass in the central spleen an additional smaller enhancing masses in that region. Adrenals/Urinary Tract: 2 masses in each adrenal gland. These are heterogeneous. These include a 3.7 x 2.8 cm  right adrenal mass and 3.4 x 2.7 cm left adrenal mass on image number 58 of series 3. There is an additional 3.5 x 1.9 cm right adrenal mass and 1.7 x 1.3 cm left adrenal mass on image number 54 series 3. Normal appearing kidneys, ureters and urinary bladder. Stomach/Bowel: Unremarkable stomach, small bowel and colon. No evidence of appendicitis. Vascular/Lymphatic: No significant vascular findings are present. No enlarged abdominal or pelvic lymph nodes. Reproductive: 4.4 x 3.0 cm left myometrial mass. 1.5 x 1.43 cm fundal myometrial mass containing a small calcification. 5.0 x 4.4 cm right ovarian cyst containing low to medium density fluid measuring 26 Hounsfield units in density. Unremarkable left ovary. Other: Large number of small, rounded and oval, circumscribed nodules scattered throughout the abdominal and pelvic peritoneal cavity and retroperitoneum. Large number of similar nodules in the subcutaneous fat bilaterally. These are most numerous in the perineal regions. Musculoskeletal: Right sacral bone island. L5-S1 degenerative changes. IMPRESSION: 1. Large number of metastases in the subcutaneous fat of the chest, abdomen pelvis. 2. Large number of metastases scattered throughout the peritoneum and retroperitoneum of the abdomen and pelvis. 3. Large number of lung metastases. 4. Mild bilateral axillary metastatic adenopathy. 5. Bilateral adrenal metastases. 6. Cholelithiasis. 7. Uterine fibroids. 8. Right ovarian probable endometrioma or hemorrhagic cyst, based on previous ultrasound findings. 9. 2 mm right lobe thyroid nodule. This is most likely an incidental benign nodule. A metastasis is less likely. Electronically Signed   By: Claudie Revering M.D.   On: 01/26/2018 20:41   Dg Abd 2 Views  Result Date: 01/26/2018 CLINICAL DATA:  Nausea and vomiting. EXAM: ABDOMEN - 2 VIEW COMPARISON:  None. FINDINGS: The bowel gas pattern is normal. There is no evidence of free air. No radio-opaque calculi or other  significant radiographic abnormality is seen. Probable bone island in the right sacral ala. No acute osseous abnormality. IMPRESSION: Negative. Electronically Signed   By: Titus Dubin M.D.   On: 01/26/2018 14:01   Ms Digital Screening Tomo Bilateral  Result Date: 01/27/2018 CLINICAL DATA:  Screening. 47 year old female with known metastatic melanoma. Patient had a CT scan of the chest, abdomen and pelvis on 01/26/2018 which showed a large number of subcutaneous metastasis in the fat of the chest, abdomen and pelvis. EXAM: DIGITAL SCREENING BILATERAL MAMMOGRAM WITH TOMO AND CAD COMPARISON:  Previous exam(s). ACR Breast Density Category c: The breast tissue is heterogeneously dense, which may obscure small masses. FINDINGS: There are no findings suspicious for breast malignancy. Innumerable rounded masses are seen bilaterally consistent with subcutaneous metastatic melanoma. Images were processed with CAD. IMPRESSION: Innumerable subcutaneous  nodules in both breast consistent with metastatic melanoma. No mammographic evidence of breast malignancy. A result letter of this screening mammogram will be mailed directly to the patient. RECOMMENDATION: Screening mammogram in one year. (Code:SM-B-01Y) BI-RADS CATEGORY  1: Negative. Electronically Signed   By: Lillia Mountain M.D.   On: 01/27/2018 13:43   [place] Core Biopsy (soft Tissue)  Result Date: 01/27/2018 INDICATION: Melanoma, cutaneous metastases EXAM: Ultrasound right chest wall subcutaneous mass core biopsy MEDICATIONS: 1% lidocaine local ANESTHESIA/SEDATION: Moderate (conscious) sedation was employed during this procedure. A total of Versed 2.0 mg and Fentanyl 50 mcg was administered intravenously. Moderate Sedation Time: 10 minutes. The patient's level of consciousness and vital signs were monitored continuously by radiology nursing throughout the procedure under my direct supervision. FLUOROSCOPY TIME:  Fluoroscopy Time: None. COMPLICATIONS: None immediate.  PROCEDURE: Informed written consent was obtained from the patient after a thorough discussion of the procedural risks, benefits and alternatives. All questions were addressed. Maximal Sterile Barrier Technique was utilized including caps, mask, sterile gowns, sterile gloves, sterile drape, hand hygiene and skin antiseptic. A timeout was performed prior to the initiation of the procedure. Previous imaging reviewed. Preliminary ultrasound performed. The right lateral chest 2 cm subcutaneous nodule by chest CT was localized with ultrasound just inferior to the right breast. Overlying skin marked. Under sterile conditions and local anesthesia, an 18 gauge core biopsy was advanced to the lesion. Needle position confirmed with ultrasound. 18 gauge core biopsies obtained (4). Samples placed in formalin. Needle removed. Postprocedure imaging demonstrates no hemorrhage or hematoma. Patient tolerated the biopsy well. IMPRESSION: Successful ultrasound right lateral chest wall subcutaneous mass core biopsies Electronically Signed   By: Jerilynn Mages.  Shick M.D.   On: 01/27/2018 12:06               Blood pressure 110/69, pulse 82, temperature 98.9 F (37.2 C), temperature source Oral, resp. rate 16, height _0  (1.549 m), weight 73.5 kg (162 lb), last menstrual period 01/15/2018, SpO2 97 %.  Physical exam:   General--Pleasant white female eating a popsicle ENT--nonicteric Neck--no lymphadenopathy Heart--regular rate and rhythm without murmurs or gallops Lungs--clear Abdomen--completely nontender Psych--alert and oriented answers questions appropriately   Assessment: 1.  Diffuse metastatic disease of unknown primary 2.  Hematemesis.  Could be a benign ulcer or primary tumor agree endoscopy may be helpful  Plan: We will go ahead and arrange EGD tomorrow.  Time is still somewhat unclear.  Have discussed with patient she is agreeable.   Nancy Fetter 01/27/2018, 5:56 PM   This note was created using  voice recognition software and minor errors may Have occurred unintentionally. Pager: [phone] If no answer or after hours call [phone]

## 2018-01-27 NOTE — Progress Notes (Signed)
PROGRESS NOTE    Kaitlyn Duncan  EFE:[id number] DOB: 1971-05-12 DOA: 01/26/2018 PCP: Marliss Coots, NP   Brief Narrative: Kaitlyn Duncan is a 47 y.o. female with a history of anemia and diabetes mellitus. She presented secondary to coffee-ground emesis and found to have metastatic disease concerning for metastatic melanoma. She is on protonix and started on decadron and keppra for brain metastasis.   Assessment & Plan:   Active Problems:   Brain metastasis ([place])  Metastatic cancer Multiple sites including brain, lungs, peritoneum, adrenals. Likely melanoma. -Oncology consulted -Radiation oncology recommendations: radiation therapy -IR consult for biopsy -Keppra for seizure prophylaxis -Continue Decadron  Coffee ground emesis Hemoglobin is stable. No recurrent episodes -GI recommendations -Will switch to clear liquid diet while awaiting GI recommendations -Continue Protonix   DVT prophylaxis: SCDs Code Status:   Code Status: Full Code Family Communication: None at bedside Disposition Plan: Discharge pending GI and oncology workup   Consultants:   Medical oncology  Radiation oncology  Gastroenterology  Interventional radiology  Procedures:   None  Antimicrobials:  None    Subjective: Hungry  Objective: Vitals:   01/27/18 1114 01/27/18 1135 01/27/18 1140 01/27/18 1307  BP: 136/85 120/69 114/68 110/69  Pulse: 96 94 92 82  Resp: 18 14 16 16   Temp:    98.9 F (37.2 C)  TempSrc:    Oral  SpO2: 99% 99% 98% 97%  Weight:      Height:        Intake/Output Summary (Last 24 hours) at 01/27/2018 1501 Last data filed at 01/27/2018 1000 Gross per 24 hour  Intake 1120 ml  Output -  Net 1120 ml   Filed Weights   01/26/18 1207  Weight: 73.5 kg (162 lb)    Examination:  General exam: Appears calm and comfortable Respiratory system: Clear to auscultation. Respiratory effort normal. Cardiovascular system: S1 & S2 heard, RRR. No  murmurs. Gastrointestinal system: Abdomen is nondistended, soft and nontender. Normal bowel sounds heard. Central nervous system: Alert and oriented. No focal neurological deficits. Extremities: No edema. No calf tenderness Skin: No cyanosis. No rashes Psychiatry: Judgement and insight appear normal. Mood & affect appropriate.     Data Reviewed: I have personally reviewed following labs and imaging studies  CBC: Recent Labs  Lab 01/26/18 1227 01/27/18 0650 01/27/18 1007  WBC 7.6 7.0  --   HGB 9.8* 9.5* 9.8*  HCT 33.3* 30.3* 31.5*  MCV 83.5 81.0  --   PLT 131* 133*  --    Basic Metabolic Panel: Recent Labs  Lab 01/26/18 1227 01/27/18 0650  NA 139 137  K 4.1 4.1  CL 106 106  CO2 25 23  GLUCOSE 145* 144*  BUN 19 20  CREATININE 0.77 0.56  CALCIUM 9.2 8.7*   GFR: Estimated Creatinine Clearance: 78 mL/min (by C-G formula based on SCr of 0.56 mg/dL). Liver Function Tests: Recent Labs  Lab 01/26/18 1227  AST 26  ALT 14  ALKPHOS 79  BILITOT 0.6  PROT 7.1  ALBUMIN 3.8   No results for input(s): LIPASE, AMYLASE in the last 168 hours. No results for input(s): AMMONIA in the last 168 hours. Coagulation Profile: Recent Labs  Lab 01/27/18 1007  INR 1.01   Cardiac Enzymes: No results for input(s): CKTOTAL, CKMB, CKMBINDEX, TROPONINI in the last 168 hours. BNP (last 3 results) No results for input(s): PROBNP in the last 8760 hours. HbA1C: No results for input(s): HGBA1C in the last 72 hours. CBG: Recent Labs  Lab  01/26/18 2250 01/27/18 0521 01/27/18 1229  GLUCAP 158* 144* 97   Lipid Profile: No results for input(s): CHOL, HDL, LDLCALC, TRIG, CHOLHDL, LDLDIRECT in the last 72 hours. Thyroid Function Tests: No results for input(s): TSH, T4TOTAL, FREET4, T3FREE, THYROIDAB in the last 72 hours. Anemia Panel: No results for input(s): VITAMINB12, FOLATE, FERRITIN, TIBC, IRON, RETICCTPCT in the last 72 hours. Sepsis Labs: No results for input(s): PROCALCITON,  LATICACIDVEN in the last 168 hours.  No results found for this or any previous visit (from the past 240 hour(s)).       Radiology Studies: Ct Head Wo Contrast  Result Date: 01/26/2018 CLINICAL DATA:  Nausea and vomiting for 1 week EXAM: CT HEAD WITHOUT CONTRAST TECHNIQUE: Contiguous axial images were obtained from the base of the skull through the vertex without intravenous contrast. COMPARISON:  None. FINDINGS: Brain: There is an ill-defined hyperdense focus in the medial right parietal lobe measuring approximately 1 cm. There is no surrounding vasogenic edema. There is no mass effect or midline shift. Ventricular system and extra-axial space are within normal limits. Vascular: No hyperdense vessel or unexpected calcification. Skull: The cranium is intact. Sinuses/Orbits: There is opacification of the left maxillary sinus with postoperative changes. Small mucous retention cyst in the right maxillary sinus. Nasal septum is deviated to the left. Orbits are within normal limits. Other: Noncontributory IMPRESSION: There is a hyperdense focus in the right parietal lobe. Differential diagnosis includes a hyperdense mass, cavernous angioma, or subacute hemorrhage. Initially, CT head with contrast may be helpful. Ultimately, brain MRI may be required. Electronically Signed   By: Marybelle Killings M.D.   On: 01/26/2018 14:10   Ct Chest W Contrast  Result Date: 01/26/2018 CLINICAL DATA:  Left mid abdominal pain, nausea and vomiting for the past week. Hematemesis yesterday. Intermittent right breast pain for the past 3 months. Stage IV melanoma. EXAM: CT CHEST, ABDOMEN, AND PELVIS WITH CONTRAST TECHNIQUE: Multidetector CT imaging of the chest, abdomen and pelvis was performed following the standard protocol during bolus administration of intravenous contrast. CONTRAST:  13mL OMNIPAQUE IOHEXOL 300 MG/ML  SOLN COMPARISON:  Abdomen radiographs obtained earlier today. Pelvic ultrasound dated 12/29/2009. FINDINGS: CT  CHEST FINDINGS Cardiovascular: No significant vascular findings. Normal heart size. No pericardial effusion. Mediastinum/Nodes: Mildly enlarged bilateral axillary and retropectoral lymph nodes. These include a 9 mm short axis right axillary node on image number 14 series 3 and an 8 mm short axis retropectoral node on the left on image number 13 series 3. No enlarged hilar or mediastinal nodes. Thyroid gland, trachea, and esophagus demonstrate no significant findings. There is an incidentally noted 2 mm right lobe thyroid nodule. Lungs/Pleura: Large number of nodules throughout both lungs. The largest on the right is in the inferior aspect of the right upper lobe, measuring 1.5 x 1.0 cm on image number 63 series 5. The largest on the left is a left lower lobe nodule measuring 2.0 x 0.9 cm on image number 86 series 5. No pleural fluid. Musculoskeletal: Large number of small, rounded and oval, circumscribed subcutaneous nodules bilaterally. There are similar lodges in both breasts. The largest is inferior to the right breast laterally, measuring 2.4 x 1.3 cm on image number 43 series 3. Mild thoracic spine degenerative changes. Lower cervical spine degenerative changes. No bony metastases. A T12 hemangioma is noted. CT ABDOMEN PELVIS FINDINGS Hepatobiliary: Multiple noncalcified gallstones in the gallbladder. These are difficult to separate from each other. The largest individual visualized gallstone measures 8 mm. No gallbladder wall  thickening or pericholecystic fluid. Unremarkable liver. Pancreas: Unremarkable. No pancreatic ductal dilatation or surrounding inflammatory changes. Spleen: 1.1 cm oval enhancing mass in the central spleen an additional smaller enhancing masses in that region. Adrenals/Urinary Tract: 2 masses in each adrenal gland. These are heterogeneous. These include a 3.7 x 2.8 cm right adrenal mass and 3.4 x 2.7 cm left adrenal mass on image number 58 of series 3. There is an additional 3.5 x 1.9 cm  right adrenal mass and 1.7 x 1.3 cm left adrenal mass on image number 54 series 3. Normal appearing kidneys, ureters and urinary bladder. Stomach/Bowel: Unremarkable stomach, small bowel and colon. No evidence of appendicitis. Vascular/Lymphatic: No significant vascular findings are present. No enlarged abdominal or pelvic lymph nodes. Reproductive: 4.4 x 3.0 cm left myometrial mass. 1.5 x 1.43 cm fundal myometrial mass containing a small calcification. 5.0 x 4.4 cm right ovarian cyst containing low to medium density fluid measuring 26 Hounsfield units in density. Unremarkable left ovary. Other: Large number of small, rounded and oval, circumscribed nodules scattered throughout the abdominal and pelvic peritoneal cavity and retroperitoneum. Large number of similar nodules in the subcutaneous fat bilaterally. These are most numerous in the perineal regions. Musculoskeletal: Right sacral bone island. L5-S1 degenerative changes. IMPRESSION: 1. Large number of metastases in the subcutaneous fat of the chest, abdomen pelvis. 2. Large number of metastases scattered throughout the peritoneum and retroperitoneum of the abdomen and pelvis. 3. Large number of lung metastases. 4. Mild bilateral axillary metastatic adenopathy. 5. Bilateral adrenal metastases. 6. Cholelithiasis. 7. Uterine fibroids. 8. Right ovarian probable endometrioma or hemorrhagic cyst, based on previous ultrasound findings. 9. 2 mm right lobe thyroid nodule. This is most likely an incidental benign nodule. A metastasis is less likely. Electronically Signed   By: Claudie Revering M.D.   On: 01/26/2018 20:41   Mr [name] ZD Contrast  Result Date: 01/26/2018 CLINICAL DATA:  Abnormal head CT. Nausea and vomiting and anemia. Headaches EXAM: MRI HEAD WITHOUT AND WITH CONTRAST TECHNIQUE: Multiplanar, multiecho pulse sequences of the brain and surrounding structures were obtained without and with intravenous contrast. CONTRAST:  40mL MULTIHANCE GADOBENATE  DIMEGLUMINE 529 MG/ML IV SOLN COMPARISON:  CT head 01/26/2018 FINDINGS: Brain: Widespread small hemorrhagic lesions are present in the brain. Most these are very small and contain intrinsic T1 shortening due to methemoglobin or melanin. Many lesions also show enhancement suggesting underlying tumor. Largest areas of hemorrhage include the right occipital lobe measuring approximately 12 mm and right parietal lobe measuring 9 mm. Mild edema around the right parietal lesion which also shows enhancement. Approximately 17 lesions are identified in the brain. These are marked with arrows on the axial T1 post-contrast sequence. Ventricle size normal. No midline shift. No areas of acute infarction. No significant chronic ischemia. Vascular: Normal arterial flow voids Skull and upper cervical spine: No lesions in the calvarium identified. However, there are numerous small dermal lesions throughout the scalp and upper neck bilaterally. Many of these show enhancement and are suspicious for tumor deposits. Sinuses/Orbits: Mucosal edema throughout the paranasal sinuses. Normal orbit Other: None IMPRESSION: Numerous small hemorrhagic lesions throughout the brain many of which show enhancement. Largest lesion in the right parietal lobe shows mild surrounding edema. Findings are highly suspicious for hemorrhagic metastatic disease such as melanoma. In addition, there are numerous enhancing lesions throughout the scalp and skin in the upper neck also suspicious for widespread tumor. These results were called by telephone at the time of interpretation on 01/26/2018 at 4:38  pm to Dr. Duffy Bruce , who verbally acknowledged these results. Electronically Signed   By: Franchot Gallo M.D.   On: 01/26/2018 16:39   Ct Abdomen Pelvis W Contrast  Result Date: 01/26/2018 CLINICAL DATA:  Left mid abdominal pain, nausea and vomiting for the past week. Hematemesis yesterday. Intermittent right breast pain for the past 3 months. Stage IV  melanoma. EXAM: CT CHEST, ABDOMEN, AND PELVIS WITH CONTRAST TECHNIQUE: Multidetector CT imaging of the chest, abdomen and pelvis was performed following the standard protocol during bolus administration of intravenous contrast. CONTRAST:  164mL OMNIPAQUE IOHEXOL 300 MG/ML  SOLN COMPARISON:  Abdomen radiographs obtained earlier today. Pelvic ultrasound dated 12/29/2009. FINDINGS: CT CHEST FINDINGS Cardiovascular: No significant vascular findings. Normal heart size. No pericardial effusion. Mediastinum/Nodes: Mildly enlarged bilateral axillary and retropectoral lymph nodes. These include a 9 mm short axis right axillary node on image number 14 series 3 and an 8 mm short axis retropectoral node on the left on image number 13 series 3. No enlarged hilar or mediastinal nodes. Thyroid gland, trachea, and esophagus demonstrate no significant findings. There is an incidentally noted 2 mm right lobe thyroid nodule. Lungs/Pleura: Large number of nodules throughout both lungs. The largest on the right is in the inferior aspect of the right upper lobe, measuring 1.5 x 1.0 cm on image number 63 series 5. The largest on the left is a left lower lobe nodule measuring 2.0 x 0.9 cm on image number 86 series 5. No pleural fluid. Musculoskeletal: Large number of small, rounded and oval, circumscribed subcutaneous nodules bilaterally. There are similar lodges in both breasts. The largest is inferior to the right breast laterally, measuring 2.4 x 1.3 cm on image number 43 series 3. Mild thoracic spine degenerative changes. Lower cervical spine degenerative changes. No bony metastases. A T12 hemangioma is noted. CT ABDOMEN PELVIS FINDINGS Hepatobiliary: Multiple noncalcified gallstones in the gallbladder. These are difficult to separate from each other. The largest individual visualized gallstone measures 8 mm. No gallbladder wall thickening or pericholecystic fluid. Unremarkable liver. Pancreas: Unremarkable. No pancreatic ductal  dilatation or surrounding inflammatory changes. Spleen: 1.1 cm oval enhancing mass in the central spleen an additional smaller enhancing masses in that region. Adrenals/Urinary Tract: 2 masses in each adrenal gland. These are heterogeneous. These include a 3.7 x 2.8 cm right adrenal mass and 3.4 x 2.7 cm left adrenal mass on image number 58 of series 3. There is an additional 3.5 x 1.9 cm right adrenal mass and 1.7 x 1.3 cm left adrenal mass on image number 54 series 3. Normal appearing kidneys, ureters and urinary bladder. Stomach/Bowel: Unremarkable stomach, small bowel and colon. No evidence of appendicitis. Vascular/Lymphatic: No significant vascular findings are present. No enlarged abdominal or pelvic lymph nodes. Reproductive: 4.4 x 3.0 cm left myometrial mass. 1.5 x 1.43 cm fundal myometrial mass containing a small calcification. 5.0 x 4.4 cm right ovarian cyst containing low to medium density fluid measuring 26 Hounsfield units in density. Unremarkable left ovary. Other: Large number of small, rounded and oval, circumscribed nodules scattered throughout the abdominal and pelvic peritoneal cavity and retroperitoneum. Large number of similar nodules in the subcutaneous fat bilaterally. These are most numerous in the perineal regions. Musculoskeletal: Right sacral bone island. L5-S1 degenerative changes. IMPRESSION: 1. Large number of metastases in the subcutaneous fat of the chest, abdomen pelvis. 2. Large number of metastases scattered throughout the peritoneum and retroperitoneum of the abdomen and pelvis. 3. Large number of lung metastases. 4. Mild bilateral axillary  metastatic adenopathy. 5. Bilateral adrenal metastases. 6. Cholelithiasis. 7. Uterine fibroids. 8. Right ovarian probable endometrioma or hemorrhagic cyst, based on previous ultrasound findings. 9. 2 mm right lobe thyroid nodule. This is most likely an incidental benign nodule. A metastasis is less likely. Electronically Signed   By: Claudie Revering M.D.   On: 01/26/2018 20:41   Dg Abd 2 Views  Result Date: 01/26/2018 CLINICAL DATA:  Nausea and vomiting. EXAM: ABDOMEN - 2 VIEW COMPARISON:  None. FINDINGS: The bowel gas pattern is normal. There is no evidence of free air. No radio-opaque calculi or other significant radiographic abnormality is seen. Probable bone island in the right sacral ala. No acute osseous abnormality. IMPRESSION: Negative. Electronically Signed   By: Titus Dubin M.D.   On: 01/26/2018 14:01   Ms Digital Screening Tomo Bilateral  Result Date: 01/27/2018 CLINICAL DATA:  Screening. 47 year old female with known metastatic melanoma. Patient had a CT scan of the chest, abdomen and pelvis on 01/26/2018 which showed a large number of subcutaneous metastasis in the fat of the chest, abdomen and pelvis. EXAM: DIGITAL SCREENING BILATERAL MAMMOGRAM WITH TOMO AND CAD COMPARISON:  Previous exam(s). ACR Breast Density Category c: The breast tissue is heterogeneously dense, which may obscure small masses. FINDINGS: There are no findings suspicious for breast malignancy. Innumerable rounded masses are seen bilaterally consistent with subcutaneous metastatic melanoma. Images were processed with CAD. IMPRESSION: Innumerable subcutaneous nodules in both breast consistent with metastatic melanoma. No mammographic evidence of breast malignancy. A result letter of this screening mammogram will be mailed directly to the patient. RECOMMENDATION: Screening mammogram in one year. (Code:SM-B-01Y) BI-RADS CATEGORY  1: Negative. Electronically Signed   By: Lillia Mountain M.D.   On: 01/27/2018 13:43   [place] Core Biopsy (soft Tissue)  Result Date: 01/27/2018 INDICATION: Melanoma, cutaneous metastases EXAM: Ultrasound right chest wall subcutaneous mass core biopsy MEDICATIONS: 1% lidocaine local ANESTHESIA/SEDATION: Moderate (conscious) sedation was employed during this procedure. A total of Versed 2.0 mg and Fentanyl 50 mcg was administered intravenously.  Moderate Sedation Time: 10 minutes. The patient's level of consciousness and vital signs were monitored continuously by radiology nursing throughout the procedure under my direct supervision. FLUOROSCOPY TIME:  Fluoroscopy Time: None. COMPLICATIONS: None immediate. PROCEDURE: Informed written consent was obtained from the patient after a thorough discussion of the procedural risks, benefits and alternatives. All questions were addressed. Maximal Sterile Barrier Technique was utilized including caps, mask, sterile gowns, sterile gloves, sterile drape, hand hygiene and skin antiseptic. A timeout was performed prior to the initiation of the procedure. Previous imaging reviewed. Preliminary ultrasound performed. The right lateral chest 2 cm subcutaneous nodule by chest CT was localized with ultrasound just inferior to the right breast. Overlying skin marked. Under sterile conditions and local anesthesia, an 18 gauge core biopsy was advanced to the lesion. Needle position confirmed with ultrasound. 18 gauge core biopsies obtained (4). Samples placed in formalin. Needle removed. Postprocedure imaging demonstrates no hemorrhage or hematoma. Patient tolerated the biopsy well. IMPRESSION: Successful ultrasound right lateral chest wall subcutaneous mass core biopsies Electronically Signed   By: Jerilynn Mages.  Shick M.D.   On: 01/27/2018 12:06        Scheduled Meds: . dexamethasone  4 mg Intravenous Q6H  . fentaNYL      . lidocaine      . loratadine  10 mg Oral Daily  . midazolam      . multivitamin with minerals  1 tablet Oral Daily  . pantoprazole (PROTONIX) IV  40  mg Intravenous Q12H   Continuous Infusions: . levETIRAcetam Stopped (01/27/18 1149)     LOS: 1 day     Cordelia Poche, MD [facility] 01/27/2018, 3:01 PM Pager: [phone]  If 7PM-7AM, please contact night-coverage [email or web address] 01/27/2018, 3:01 PM

## 2018-01-28 ENCOUNTER — Encounter (HOSPITAL_COMMUNITY)
Admission: EM | Disposition: A | Discharge status: Home / Self Care | Payer: Self-pay | Source: Home / Self Care | Attending: Family Medicine

## 2018-01-28 ENCOUNTER — Encounter (HOSPITAL_COMMUNITY): Payer: Self-pay | Admitting: Gastroenterology

## 2018-01-28 ENCOUNTER — Ambulatory Visit (HOSPITAL_COMMUNITY)
Admit: 2018-01-28 | Discharge: 2018-01-28 | Disposition: A | Discharge status: Home / Self Care | Payer: Medicaid Other | Attending: Gastroenterology | Admitting: Gastroenterology

## 2018-01-28 HISTORY — PX: BIOPSY: SHX5522

## 2018-01-28 HISTORY — PX: ESOPHAGOGASTRODUODENOSCOPY: SHX5428

## 2018-01-28 LAB — URINALYSIS, ROUTINE W REFLEX MICROSCOPIC
Bacteria, UA: NONE SEEN
Bilirubin Urine: NEGATIVE
GLUCOSE, UA: NEGATIVE mg/dL
Ketones, ur: NEGATIVE mg/dL
NITRITE: NEGATIVE
PH: 5 (ref 5.0–8.0)
Protein, ur: NEGATIVE mg/dL
SPECIFIC GRAVITY, URINE: 1.019 (ref 1.005–1.030)

## 2018-01-28 LAB — HEMOGLOBIN AND HEMATOCRIT, BLOOD
HCT: 29.8 % — ABNORMAL LOW (ref 36.0–46.0)
HCT: 29.7 % — ABNORMAL LOW (ref 36.0–46.0)
Hemoglobin: 9.1 g/dL — ABNORMAL LOW (ref 12.0–15.0)
Hemoglobin: 9.1 g/dL — ABNORMAL LOW (ref 12.0–15.0)

## 2018-01-28 LAB — GLUCOSE, CAPILLARY
Glucose-Capillary: 116 mg/dL — ABNORMAL HIGH (ref 70–99)
Glucose-Capillary: 118 mg/dL — ABNORMAL HIGH (ref 70–99)
Glucose-Capillary: 141 mg/dL — ABNORMAL HIGH (ref 70–99)

## 2018-01-28 SURGERY — EGD (ESOPHAGOGASTRODUODENOSCOPY)
Anesthesia: Moderate Sedation

## 2018-01-28 MED ORDER — MIDAZOLAM HCL 10 MG/2ML IJ SOLN
INTRAMUSCULAR | Status: DC | PRN
  Administered 2018-01-28 (×2): 2 mg via INTRAVENOUS
  Administered 2018-01-28: 1 mg via INTRAVENOUS

## 2018-01-28 MED ORDER — MENTHOL 3 MG MT LOZG
1.0000 | LOZENGE | OROMUCOSAL | Status: DC | PRN
  Filled 2018-01-28: qty 9

## 2018-01-28 MED ORDER — GADOBENATE DIMEGLUMINE 529 MG/ML IV SOLN
15.0000 mL | Freq: Once | INTRAVENOUS | Status: AC
  Administered 2018-01-28: 15 mL via INTRAVENOUS

## 2018-01-28 MED ORDER — FENTANYL CITRATE (PF) 100 MCG/2ML IJ SOLN
INTRAMUSCULAR | Status: DC | PRN
  Administered 2018-01-28 (×2): 25 ug via INTRAVENOUS

## 2018-01-28 MED ORDER — FENTANYL CITRATE (PF) 100 MCG/2ML IJ SOLN
INTRAMUSCULAR | Status: AC
  Filled 2018-01-28: qty 4

## 2018-01-28 MED ORDER — DIPHENHYDRAMINE HCL 50 MG/ML IJ SOLN
INTRAMUSCULAR | Status: AC
  Filled 2018-01-28: qty 1

## 2018-01-28 MED ORDER — MIDAZOLAM HCL 5 MG/ML IJ SOLN
INTRAMUSCULAR | Status: AC
  Filled 2018-01-28: qty 2

## 2018-01-28 MED ORDER — ACETAMINOPHEN 325 MG PO TABS: 650.0000 mg | ORAL_TABLET | Freq: Four times a day (QID) | ORAL | Status: DC | PRN

## 2018-01-28 NOTE — Progress Notes (Signed)
Called MRI at [facility] to set up time for 3T-MRI. MRI tech will call RN back when she has availability to perform 3R-MRI.  Barbee Shropshire. Brigitte Pulse, RN

## 2018-01-28 NOTE — Op Note (Signed)
[facility] Patient Name: Kaitlyn Duncan Procedure Date: 01/28/2018 MRN: [id number] Attending MD: Nancy Fetter Dr., MD Date of Birth: 1971-06-26 CSN: [id number] Age: 47 Admit Type: Inpatient Procedure:                Upper GI endoscopy Indications:              Hematemesis in young woman who by radiographic                            criteria has diffuse metastatic cancer of unknown                            primary Providers:                Jeneen Rinks L. Anavi Branscum Dr., MD, Cleda Daub, RN, Elspeth Cho Tech., Technician Referring MD:              Medicines:                Fentanyl 50 micrograms IV, Midazolam 7 mg IV,                            Cetacaine spray Complications:            No immediate complications. Estimated Blood Loss:     Estimated blood loss was minimal. Procedure:                Pre-Anesthesia Assessment:                           - Prior to the procedure, a History and Physical                            was performed, and patient medications and                            allergies were reviewed. The patient's tolerance of                            previous anesthesia was also reviewed. The risks                            and benefits of the procedure and the sedation                            options and risks were discussed with the patient.                            All questions were answered, and informed consent                            was obtained. Prior Anticoagulants: The patient has  taken no previous anticoagulant or antiplatelet                            agents. ASA Grade Assessment: IV - A patient with                            severe systemic disease that is a constant threat                            to life. After reviewing the risks and benefits,                            the patient was deemed in satisfactory condition to                            undergo the  procedure.                           After obtaining informed consent, the endoscope was                            passed under direct vision. Throughout the                            procedure, the patient's blood pressure, pulse, and                            oxygen saturations were monitored continuously. The                            [id number]-H190 ([id number]) Olympus adult endoscope was                            introduced through the mouth, and advanced to the                            second part of duodenum. The upper GI endoscopy was                            accomplished without difficulty. The patient                            tolerated the procedure well. Scope In: Scope Out: Findings:      The esophagus was normal.      A few 2 to 4 mm papules (nodules) with no bleeding and no stigmata of       recent bleeding were found on the greater curvature of the stomach.       Biopsies were taken with a cold forceps for histology. The pathology       specimen was placed into Bottle Number 1. These nodules were small and       very dark.      A large, fungating, non-circumferential mass with no bleeding was found       in the cardia. Biopsies were taken with  a cold forceps for histology.       The pathology specimen was placed into Bottle Number 2.      A few 2 to 4 mm nodules were found in the duodenal bulb, in the first       portion of the duodenum and in the second portion of the duodenum. These       nodules were also dark. Due to the patient gagging and retching we       elected not to biopsy these since we already had biopsies from the       stomach Impression:               - Normal esophagus.                           - A few papules (nodules) found in the stomach.                            Biopsied.                           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                            Biopsied. Suspect melanoma                           - Nodule found in the  duodenum. Moderate Sedation:      Moderate (conscious) sedation was administered by the endoscopy nurse       and supervised by the endoscopist. The following parameters were       monitored: oxygen saturation, heart rate, blood pressure, respiratory       rate, EKG, adequacy of pulmonary ventilation, and response to care. Recommendation:           - Advance diet as tolerated.                           - Continue present medications.                           - Use Protonix (pantoprazole) 40 mg PO BID.                           - Return patient to hospital ward for ongoing care. Procedure Code(s):        --- Professional ---                           [phone], Esophagogastroduodenoscopy, flexible,                            transoral; with biopsy, single or multiple Diagnosis Code(s):        --- Professional ---                           D49.0, Neoplasm of unspecified behavior of  digestive system                           K31.89, Other diseases of stomach and duodenum                           K92.0, Hematemesis CPT copyright 2017 [employer]. All rights reserved. The codes documented in this report are preliminary and upon coder review may  be revised to meet current compliance requirements. Nancy Fetter Dr., MD 01/28/2018 2:13:00 PM This report has been signed electronically. Number of Addenda: 0

## 2018-01-28 NOTE — Progress Notes (Signed)
PROGRESS NOTE    Kaitlyn Duncan  GUR:[id number] DOB: [birth year] DOA: 01/26/2018 PCP: Marliss Coots, NP   Brief Narrative: Kaitlyn Duncan is a 47 y.o. female with a history of anemia and diabetes mellitus. She presented secondary to coffee-ground emesis and found to have metastatic disease concerning for metastatic melanoma. She is on protonix and started on decadron and keppra for brain metastasis.   Assessment & Plan:   Active Problems:   Brain metastasis ([place])  Metastatic cancer Multiple sites including brain, lungs, peritoneum, adrenals. Likely melanoma. Patient is hesitant to start treatment as she is wanting a possible second opinion for more hemopathic remedies. Had long discussion with patient with regard to her questions. Also discussed patient's ability to seek another opinion at another facility as she was asking about [place] consulting with [facility] in [place]. Tissue biopsy obtained on 7/26 and is pending -Oncology consulted -Radiation oncology recommendations: radiation therapy -Keppra for seizure prophylaxis -Continue Decadron  Coffee ground emesis Hemoglobin is stable. No recurrent episodes -GI recommendations: plan for EGD today -Continue Protonix  Blood when wiping Patient with concern for hematuria. Obtained urinalysis which was significant for hemoglobin with no evidence of RBCs on microscopy. Blood was not seen in urine but only when wiping. Blood likely vaginal in etiology as patient has a history of irregular periods. Hemoglobin stable   DVT prophylaxis: SCDs Code Status:   Code Status: Full Code Family Communication: None at bedside Disposition Plan: Discharge pending GI and oncology workup   Consultants:   Medical oncology  Radiation oncology  Gastroenterology  Interventional radiology  Procedures:   None  Antimicrobials:  None    Subjective: Did not sleep well. Hoping to eat soon after  EGD.  Objective: Vitals:   01/27/18 1140 01/27/18 1307 01/28/18 0002 01/28/18 0610  BP: 114/68 110/69 117/76 109/71  Pulse: 92 82 81 82  Resp: 16 16 18 16   Temp:  98.9 F (37.2 C) 97.7 F (36.5 C) 98.7 F (37.1 C)  TempSrc:  Oral Oral Oral  SpO2: 98% 97% 98% 98%  Weight:      Height:        Intake/Output Summary (Last 24 hours) at 01/28/2018 1201 Last data filed at 01/28/2018 0913 Gross per 24 hour  Intake 1741.67 ml  Output 2350 ml  Net -608.33 ml   Filed Weights   01/26/18 1207  Weight: 73.5 kg (162 lb)    Examination:  General: Well appearing, no distress    Data Reviewed: I have personally reviewed following labs and imaging studies  CBC: Recent Labs  Lab 01/26/18 1227 01/27/18 0650 01/27/18 1007 01/27/18 1948 01/28/18 0329  WBC 7.6 7.0  --   --   --   HGB 9.8* 9.5* 9.8* 9.3* 9.1*  HCT 33.3* 30.3* 31.5* 30.2* 29.8*  MCV 83.5 81.0  --   --   --   PLT 131* 133*  --   --   --    Basic Metabolic Panel: Recent Labs  Lab 01/26/18 1227 01/27/18 0650  NA 139 137  K 4.1 4.1  CL 106 106  CO2 25 23  GLUCOSE 145* 144*  BUN 19 20  CREATININE 0.77 0.56  CALCIUM 9.2 8.7*   GFR: Estimated Creatinine Clearance: 78 mL/min (by C-G formula based on SCr of 0.56 mg/dL). Liver Function Tests: Recent Labs  Lab 01/26/18 1227  AST 26  ALT 14  ALKPHOS 79  BILITOT 0.6  PROT 7.1  ALBUMIN 3.8  No results for input(s): LIPASE, AMYLASE in the last 168 hours. No results for input(s): AMMONIA in the last 168 hours. Coagulation Profile: Recent Labs  Lab 01/27/18 1007  INR 1.01   Cardiac Enzymes: No results for input(s): CKTOTAL, CKMB, CKMBINDEX, TROPONINI in the last 168 hours. BNP (last 3 results) No results for input(s): PROBNP in the last 8760 hours. HbA1C: No results for input(s): HGBA1C in the last 72 hours. CBG: Recent Labs  Lab 01/27/18 0521 01/27/18 1229 01/27/18 1707 01/28/18 0000 01/28/18 0615  GLUCAP 144* 97 230* 118* 141*   Lipid  Profile: No results for input(s): CHOL, HDL, LDLCALC, TRIG, CHOLHDL, LDLDIRECT in the last 72 hours. Thyroid Function Tests: No results for input(s): TSH, T4TOTAL, FREET4, T3FREE, THYROIDAB in the last 72 hours. Anemia Panel: No results for input(s): VITAMINB12, FOLATE, FERRITIN, TIBC, IRON, RETICCTPCT in the last 72 hours. Sepsis Labs: No results for input(s): PROCALCITON, LATICACIDVEN in the last 168 hours.  No results found for this or any previous visit (from the past 240 hour(s)).       Radiology Studies: Ct Head Wo Contrast  Result Date: 01/26/2018 CLINICAL DATA:  Nausea and vomiting for 1 week EXAM: CT HEAD WITHOUT CONTRAST TECHNIQUE: Contiguous axial images were obtained from the base of the skull through the vertex without intravenous contrast. COMPARISON:  None. FINDINGS: Brain: There is an ill-defined hyperdense focus in the medial right parietal lobe measuring approximately 1 cm. There is no surrounding vasogenic edema. There is no mass effect or midline shift. Ventricular system and extra-axial space are within normal limits. Vascular: No hyperdense vessel or unexpected calcification. Skull: The cranium is intact. Sinuses/Orbits: There is opacification of the left maxillary sinus with postoperative changes. Small mucous retention cyst in the right maxillary sinus. Nasal septum is deviated to the left. Orbits are within normal limits. Other: Noncontributory IMPRESSION: There is a hyperdense focus in the right parietal lobe. Differential diagnosis includes a hyperdense mass, cavernous angioma, or subacute hemorrhage. Initially, CT head with contrast may be helpful. Ultimately, brain MRI may be required. Electronically Signed   By: Marybelle Killings M.D.   On: 01/26/2018 14:10   Ct Chest W Contrast  Result Date: 01/26/2018 CLINICAL DATA:  Left mid abdominal pain, nausea and vomiting for the past week. Hematemesis yesterday. Intermittent right breast pain for the past 3 months. Stage IV  melanoma. EXAM: CT CHEST, ABDOMEN, AND PELVIS WITH CONTRAST TECHNIQUE: Multidetector CT imaging of the chest, abdomen and pelvis was performed following the standard protocol during bolus administration of intravenous contrast. CONTRAST:  135mL OMNIPAQUE IOHEXOL 300 MG/ML  SOLN COMPARISON:  Abdomen radiographs obtained earlier today. Pelvic ultrasound dated 12/29/2009. FINDINGS: CT CHEST FINDINGS Cardiovascular: No significant vascular findings. Normal heart size. No pericardial effusion. Mediastinum/Nodes: Mildly enlarged bilateral axillary and retropectoral lymph nodes. These include a 9 mm short axis right axillary node on image number 14 series 3 and an 8 mm short axis retropectoral node on the left on image number 13 series 3. No enlarged hilar or mediastinal nodes. Thyroid gland, trachea, and esophagus demonstrate no significant findings. There is an incidentally noted 2 mm right lobe thyroid nodule. Lungs/Pleura: Large number of nodules throughout both lungs. The largest on the right is in the inferior aspect of the right upper lobe, measuring 1.5 x 1.0 cm on image number 63 series 5. The largest on the left is a left lower lobe nodule measuring 2.0 x 0.9 cm on image number 86 series 5. No pleural fluid. Musculoskeletal: Large  number of small, rounded and oval, circumscribed subcutaneous nodules bilaterally. There are similar lodges in both breasts. The largest is inferior to the right breast laterally, measuring 2.4 x 1.3 cm on image number 43 series 3. Mild thoracic spine degenerative changes. Lower cervical spine degenerative changes. No bony metastases. A T12 hemangioma is noted. CT ABDOMEN PELVIS FINDINGS Hepatobiliary: Multiple noncalcified gallstones in the gallbladder. These are difficult to separate from each other. The largest individual visualized gallstone measures 8 mm. No gallbladder wall thickening or pericholecystic fluid. Unremarkable liver. Pancreas: Unremarkable. No pancreatic ductal  dilatation or surrounding inflammatory changes. Spleen: 1.1 cm oval enhancing mass in the central spleen an additional smaller enhancing masses in that region. Adrenals/Urinary Tract: 2 masses in each adrenal gland. These are heterogeneous. These include a 3.7 x 2.8 cm right adrenal mass and 3.4 x 2.7 cm left adrenal mass on image number 58 of series 3. There is an additional 3.5 x 1.9 cm right adrenal mass and 1.7 x 1.3 cm left adrenal mass on image number 54 series 3. Normal appearing kidneys, ureters and urinary bladder. Stomach/Bowel: Unremarkable stomach, small bowel and colon. No evidence of appendicitis. Vascular/Lymphatic: No significant vascular findings are present. No enlarged abdominal or pelvic lymph nodes. Reproductive: 4.4 x 3.0 cm left myometrial mass. 1.5 x 1.43 cm fundal myometrial mass containing a small calcification. 5.0 x 4.4 cm right ovarian cyst containing low to medium density fluid measuring 26 Hounsfield units in density. Unremarkable left ovary. Other: Large number of small, rounded and oval, circumscribed nodules scattered throughout the abdominal and pelvic peritoneal cavity and retroperitoneum. Large number of similar nodules in the subcutaneous fat bilaterally. These are most numerous in the perineal regions. Musculoskeletal: Right sacral bone island. L5-S1 degenerative changes. IMPRESSION: 1. Large number of metastases in the subcutaneous fat of the chest, abdomen pelvis. 2. Large number of metastases scattered throughout the peritoneum and retroperitoneum of the abdomen and pelvis. 3. Large number of lung metastases. 4. Mild bilateral axillary metastatic adenopathy. 5. Bilateral adrenal metastases. 6. Cholelithiasis. 7. Uterine fibroids. 8. Right ovarian probable endometrioma or hemorrhagic cyst, based on previous ultrasound findings. 9. 2 mm right lobe thyroid nodule. This is most likely an incidental benign nodule. A metastasis is less likely. Electronically Signed   By: Claudie Revering M.D.   On: 01/26/2018 20:41   Mr [name] OZ Contrast  Result Date: 01/26/2018 CLINICAL DATA:  Abnormal head CT. Nausea and vomiting and anemia. Headaches EXAM: MRI HEAD WITHOUT AND WITH CONTRAST TECHNIQUE: Multiplanar, multiecho pulse sequences of the brain and surrounding structures were obtained without and with intravenous contrast. CONTRAST:  59mL MULTIHANCE GADOBENATE DIMEGLUMINE 529 MG/ML IV SOLN COMPARISON:  CT head 01/26/2018 FINDINGS: Brain: Widespread small hemorrhagic lesions are present in the brain. Most these are very small and contain intrinsic T1 shortening due to methemoglobin or melanin. Many lesions also show enhancement suggesting underlying tumor. Largest areas of hemorrhage include the right occipital lobe measuring approximately 12 mm and right parietal lobe measuring 9 mm. Mild edema around the right parietal lesion which also shows enhancement. Approximately 17 lesions are identified in the brain. These are marked with arrows on the axial T1 post-contrast sequence. Ventricle size normal. No midline shift. No areas of acute infarction. No significant chronic ischemia. Vascular: Normal arterial flow voids Skull and upper cervical spine: No lesions in the calvarium identified. However, there are numerous small dermal lesions throughout the scalp and upper neck bilaterally. Many of these show enhancement and are suspicious  for tumor deposits. Sinuses/Orbits: Mucosal edema throughout the paranasal sinuses. Normal orbit Other: None IMPRESSION: Numerous small hemorrhagic lesions throughout the brain many of which show enhancement. Largest lesion in the right parietal lobe shows mild surrounding edema. Findings are highly suspicious for hemorrhagic metastatic disease such as melanoma. In addition, there are numerous enhancing lesions throughout the scalp and skin in the upper neck also suspicious for widespread tumor. These results were called by telephone at the time of interpretation on  01/26/2018 at 4:38 pm to Dr. Duffy Bruce , who verbally acknowledged these results. Electronically Signed   By: Franchot Gallo M.D.   On: 01/26/2018 16:39   Ct Abdomen Pelvis W Contrast  Result Date: 01/26/2018 CLINICAL DATA:  Left mid abdominal pain, nausea and vomiting for the past week. Hematemesis yesterday. Intermittent right breast pain for the past 3 months. Stage IV melanoma. EXAM: CT CHEST, ABDOMEN, AND PELVIS WITH CONTRAST TECHNIQUE: Multidetector CT imaging of the chest, abdomen and pelvis was performed following the standard protocol during bolus administration of intravenous contrast. CONTRAST:  192mL OMNIPAQUE IOHEXOL 300 MG/ML  SOLN COMPARISON:  Abdomen radiographs obtained earlier today. Pelvic ultrasound dated 12/29/2009. FINDINGS: CT CHEST FINDINGS Cardiovascular: No significant vascular findings. Normal heart size. No pericardial effusion. Mediastinum/Nodes: Mildly enlarged bilateral axillary and retropectoral lymph nodes. These include a 9 mm short axis right axillary node on image number 14 series 3 and an 8 mm short axis retropectoral node on the left on image number 13 series 3. No enlarged hilar or mediastinal nodes. Thyroid gland, trachea, and esophagus demonstrate no significant findings. There is an incidentally noted 2 mm right lobe thyroid nodule. Lungs/Pleura: Large number of nodules throughout both lungs. The largest on the right is in the inferior aspect of the right upper lobe, measuring 1.5 x 1.0 cm on image number 63 series 5. The largest on the left is a left lower lobe nodule measuring 2.0 x 0.9 cm on image number 86 series 5. No pleural fluid. Musculoskeletal: Large number of small, rounded and oval, circumscribed subcutaneous nodules bilaterally. There are similar lodges in both breasts. The largest is inferior to the right breast laterally, measuring 2.4 x 1.3 cm on image number 43 series 3. Mild thoracic spine degenerative changes. Lower cervical spine degenerative  changes. No bony metastases. A T12 hemangioma is noted. CT ABDOMEN PELVIS FINDINGS Hepatobiliary: Multiple noncalcified gallstones in the gallbladder. These are difficult to separate from each other. The largest individual visualized gallstone measures 8 mm. No gallbladder wall thickening or pericholecystic fluid. Unremarkable liver. Pancreas: Unremarkable. No pancreatic ductal dilatation or surrounding inflammatory changes. Spleen: 1.1 cm oval enhancing mass in the central spleen an additional smaller enhancing masses in that region. Adrenals/Urinary Tract: 2 masses in each adrenal gland. These are heterogeneous. These include a 3.7 x 2.8 cm right adrenal mass and 3.4 x 2.7 cm left adrenal mass on image number 58 of series 3. There is an additional 3.5 x 1.9 cm right adrenal mass and 1.7 x 1.3 cm left adrenal mass on image number 54 series 3. Normal appearing kidneys, ureters and urinary bladder. Stomach/Bowel: Unremarkable stomach, small bowel and colon. No evidence of appendicitis. Vascular/Lymphatic: No significant vascular findings are present. No enlarged abdominal or pelvic lymph nodes. Reproductive: 4.4 x 3.0 cm left myometrial mass. 1.5 x 1.43 cm fundal myometrial mass containing a small calcification. 5.0 x 4.4 cm right ovarian cyst containing low to medium density fluid measuring 26 Hounsfield units in density. Unremarkable left ovary. Other: Large  number of small, rounded and oval, circumscribed nodules scattered throughout the abdominal and pelvic peritoneal cavity and retroperitoneum. Large number of similar nodules in the subcutaneous fat bilaterally. These are most numerous in the perineal regions. Musculoskeletal: Right sacral bone island. L5-S1 degenerative changes. IMPRESSION: 1. Large number of metastases in the subcutaneous fat of the chest, abdomen pelvis. 2. Large number of metastases scattered throughout the peritoneum and retroperitoneum of the abdomen and pelvis. 3. Large number of lung  metastases. 4. Mild bilateral axillary metastatic adenopathy. 5. Bilateral adrenal metastases. 6. Cholelithiasis. 7. Uterine fibroids. 8. Right ovarian probable endometrioma or hemorrhagic cyst, based on previous ultrasound findings. 9. 2 mm right lobe thyroid nodule. This is most likely an incidental benign nodule. A metastasis is less likely. Electronically Signed   By: Claudie Revering M.D.   On: 01/26/2018 20:41   Dg Abd 2 Views  Result Date: 01/26/2018 CLINICAL DATA:  Nausea and vomiting. EXAM: ABDOMEN - 2 VIEW COMPARISON:  None. FINDINGS: The bowel gas pattern is normal. There is no evidence of free air. No radio-opaque calculi or other significant radiographic abnormality is seen. Probable bone island in the right sacral ala. No acute osseous abnormality. IMPRESSION: Negative. Electronically Signed   By: Titus Dubin M.D.   On: 01/26/2018 14:01   [place] Core Biopsy (soft Tissue)  Result Date: 01/27/2018 INDICATION: Melanoma, cutaneous metastases EXAM: Ultrasound right chest wall subcutaneous mass core biopsy MEDICATIONS: 1% lidocaine local ANESTHESIA/SEDATION: Moderate (conscious) sedation was employed during this procedure. A total of Versed 2.0 mg and Fentanyl 50 mcg was administered intravenously. Moderate Sedation Time: 10 minutes. The patient's level of consciousness and vital signs were monitored continuously by radiology nursing throughout the procedure under my direct supervision. FLUOROSCOPY TIME:  Fluoroscopy Time: None. COMPLICATIONS: None immediate. PROCEDURE: Informed written consent was obtained from the patient after a thorough discussion of the procedural risks, benefits and alternatives. All questions were addressed. Maximal Sterile Barrier Technique was utilized including caps, mask, sterile gowns, sterile gloves, sterile drape, hand hygiene and skin antiseptic. A timeout was performed prior to the initiation of the procedure. Previous imaging reviewed. Preliminary ultrasound performed.  The right lateral chest 2 cm subcutaneous nodule by chest CT was localized with ultrasound just inferior to the right breast. Overlying skin marked. Under sterile conditions and local anesthesia, an 18 gauge core biopsy was advanced to the lesion. Needle position confirmed with ultrasound. 18 gauge core biopsies obtained (4). Samples placed in formalin. Needle removed. Postprocedure imaging demonstrates no hemorrhage or hematoma. Patient tolerated the biopsy well. IMPRESSION: Successful ultrasound right lateral chest wall subcutaneous mass core biopsies Electronically Signed   By: Jerilynn Mages.  Shick M.D.   On: 01/27/2018 12:06        Scheduled Meds: . dexamethasone  4 mg Intravenous Q6H  . loratadine  10 mg Oral Daily  . multivitamin with minerals  1 tablet Oral Daily  . pantoprazole (PROTONIX) IV  40 mg Intravenous Q12H   Continuous Infusions: . sodium chloride 20 mL/hr at 01/28/18 0615  . levETIRAcetam 1,000 mg (01/28/18 1036)     LOS: 2 days   25 minutes spent with the patient with at least half of this time spent discussing her current clinical course, several questions answered with regard to specialist recommendations and treatment options, consultation services, support services, expectations of disease course.  Cordelia Poche, MD [facility] 01/28/2018, 12:01 PM Pager: [phone]  If 7PM-7AM, please contact night-coverage [email or web address] 01/28/2018, 12:01 PM

## 2018-01-28 NOTE — Interval H&P Note (Signed)
History and Physical Interval Note:  01/28/2018 1:26 PM  Kaitlyn Duncan  has presented today for surgery, with the diagnosis of hematemesis  The various methods of treatment have been discussed with the patient and family. After consideration of risks, benefits and other options for treatment, the patient has consented to  Procedure(s): ESOPHAGOGASTRODUODENOSCOPY (EGD) (N/A) as a surgical intervention .  The patient's history has been reviewed, patient examined, no change in status, stable for surgery.  I have reviewed the patient's chart and labs.  Questions were answered to the patient's satisfaction.     Nancy Fetter

## 2018-01-28 NOTE — Plan of Care (Signed)
Pt is receiving information from the MD in order to make more informed decisions regarding her health.

## 2018-01-29 LAB — CBC
HCT: 30.3 % — ABNORMAL LOW (ref 36.0–46.0)
Hemoglobin: 9.2 g/dL — ABNORMAL LOW (ref 12.0–15.0)
MCH: 24.9 pg — AB (ref 26.0–34.0)
MCHC: 30.4 g/dL (ref 30.0–36.0)
MCV: 82.1 fL (ref 78.0–100.0)
PLATELETS: 198 10*3/uL (ref 150–400)
RBC: 3.69 MIL/uL — ABNORMAL LOW (ref 3.87–5.11)
RDW: 14.8 % (ref 11.5–15.5)
WBC: 12.1 10*3/uL — ABNORMAL HIGH (ref 4.0–10.5)

## 2018-01-29 LAB — GLUCOSE, CAPILLARY: Glucose-Capillary: 207 mg/dL — ABNORMAL HIGH (ref 70–99)

## 2018-01-29 MED ORDER — DEXAMETHASONE 4 MG PO TABS: 4.0000 mg | ORAL_TABLET | Freq: Four times a day (QID) | ORAL | 0 refills | Status: DC

## 2018-01-29 MED ORDER — DEXAMETHASONE 4 MG PO TABS
4.0000 mg | ORAL_TABLET | Freq: Four times a day (QID) | ORAL | Status: DC
  Administered 2018-01-29 (×2): 4 mg via ORAL
  Filled 2018-01-29 (×4): qty 1

## 2018-01-29 MED ORDER — OXYBUTYNIN CHLORIDE 5 MG PO TABS: 5.0000 mg | ORAL_TABLET | Freq: Three times a day (TID) | ORAL | 0 refills | Status: DC

## 2018-01-29 MED ORDER — PANTOPRAZOLE SODIUM 40 MG IV SOLR: 40.0000 mg | Freq: Two times a day (BID) | INTRAVENOUS | 0 refills | Status: DC

## 2018-01-29 MED ORDER — LEVETIRACETAM 500 MG PO TABS
1000.0000 mg | ORAL_TABLET | Freq: Two times a day (BID) | ORAL | Status: DC
  Administered 2018-01-29: 1000 mg via ORAL
  Filled 2018-01-29: qty 2

## 2018-01-29 MED ORDER — PANTOPRAZOLE SODIUM 40 MG PO TBEC: 40.0000 mg | DELAYED_RELEASE_TABLET | Freq: Two times a day (BID) | ORAL | 0 refills | Status: DC

## 2018-01-29 MED ORDER — LEVETIRACETAM 1000 MG PO TABS: 1000.0000 mg | ORAL_TABLET | Freq: Two times a day (BID) | ORAL | 0 refills | Status: DC

## 2018-01-29 MED ORDER — OXYBUTYNIN CHLORIDE 5 MG PO TABS
5.0000 mg | ORAL_TABLET | Freq: Three times a day (TID) | ORAL | Status: DC
  Administered 2018-01-29 (×2): 5 mg via ORAL
  Filled 2018-01-29 (×2): qty 1

## 2018-01-29 NOTE — Progress Notes (Signed)
IP PROGRESS NOTE  Subjective:   Kaitlyn Duncan feels well this morning without any recent complaints.  She underwent an endoscopy yesterday and tolerated it well.  He is not reporting any hemoptysis or hematemesis.  She does report hematochezia on tissue paper after she moves her bowels.  She has not reported any abdominal pain but does report lower pelvic discomfort.  She denies any headaches or blurry vision syncope or seizures.  Her appetite is excellent and is asking for solid food.  Does not report any fevers, chills or sweats.  Does not report any cough, wheezing or hemoptysis.  Does not report any chest pain, palpitation, orthopnea or leg edema.  Does not report any nausea, vomiting or abdominal pain.  Does not report any constipation or diarrhea.  Does not report any skeletal complaints.    Does not report frequency, urgency or hematuria.  Does not report any skin rashes or lesions.  Does not report any lymphadenopathy or petechiae.  Does not report any anxiety or depression.  Remaining review of systems is negative.    Objective:  Vital signs in last 24 hours: Temp:  [98 F (36.7 C)-98.4 F (36.9 C)] 98 F (36.7 C) (07/28 0420) Pulse Rate:  [73-93] 76 (07/28 0420) Resp:  [12-21] 16 (07/28 0420) BP: (110-149)/(60-96) 127/83 (07/28 0420) SpO2:  [96 %-100 %] 97 % (07/28 0420) Weight change:  Last BM Date: 01/26/18  Intake/Output from previous day: 07/27 0701 - 07/28 0700 In: 7062 [P.O.:480; I.V.:157.7; IV Piggyback:1093.3] Out: 1150 [Urine:1150] General: Comfortable appearing woman without distress.. Head: Subcutaneous nodule around the hairline palpated. Mouth: mucous membranes moist, pharynx normal without lesions Eyes: No scleral icterus.  Pupils are equal and round reactive to light. Resp: clear to auscultation bilaterally without rhonchi or wheezes or dullness to percussion. Cardio: regular rate and rhythm, S1, S2 normal, no murmur, click, rub or gallop GI: soft, non-tender;  bowel sounds normal; no masses,  no organomegaly Musculoskeletal: No joint deformity or effusion. Neurological: No visits noted. Skin: Slight ecchymosis noted arms.    Lab Results: Recent Labs    01/27/18 0650  01/28/18 1125 01/29/18 0350  WBC 7.0  --   --  12.1*  HGB 9.5*   < > 9.1* 9.2*  HCT 30.3*   < > 29.7* 30.3*  PLT 133*  --   --  198   < > = values in this interval not displayed.    BMET Recent Labs    01/26/18 1227 01/27/18 0650  NA 139 137  K 4.1 4.1  CL 106 106  CO2 25 23  GLUCOSE 145* 144*  BUN 19 20  CREATININE 0.77 0.56  CALCIUM 9.2 8.7*     Assessment/Plan:  47 year old woman with the following:  1.  Metastatic disease to multiple organs likely arising from a melanoma primary.  She presented with subcutaneous nodules as well as diffuse involvement of the lung, adrenal among other areas.  She is status post liver biopsy with results are currently pending.  These findings were discussed today again with the patient as well as possible treatment options were reviewed.  Depending on the exact pathology this will dictate treatment.  Is highly suspicious of melanoma and if it is BRAF positive, oral targeted therapy would be the next step.  The implication of using this therapy was discussed today preliminarily.  She also understands that intravenous immunotherapy could also be a possibility of her disease is BRAF wild-type.  The plan is to wait for the  results of the pathology which will take up till next week without to occur.  Systemic therapy will not start until that is fully completed.  2.  CNS metastasis: I explained her the need for radiation therapy in addition to potential future systemic therapy.  She is already on steroids with improvement of her symptoms.  3.  GI bleeding: Likely related to malignant melanoma involvement of her gastric mucosa based on her endoscopy results.  Her hemoglobin appeared stable.  4.  Disposition: I have no objections to  discharge in the next 24 to 48 hours to follow-up outpatient setting.  I explained to her the majority of her care will take place in the outpatient setting.  Will arrange outpatient oncology follow-up for her.  25  minutes was spent with the patient face-to-face today.  More than 50% of time was dedicated to patient counseling, education and cussing her disease status and future treatment options including coordinating her care.     LOS: 3 days   Zola Button 01/29/2018, 8:48 AM

## 2018-01-29 NOTE — Progress Notes (Signed)
Worked with CM to arrange transportation and medications. Patient spent the majority of time during the afternoon making phone calls trying to find a place to stay. Patient stated bus stopped running at 6pm and signed discharge papers at 5:15pm but continued making calls to arrange for housing/transportation. AC contacted and will offter taxi voucher but patient must provide address where she is going. Will pass along to next RN.  Barbee Shropshire. Brigitte Pulse, RN

## 2018-01-29 NOTE — Care Management Note (Addendum)
Case Management Note  Patient Details  Name: Kaitlyn Duncan MRN: [id number] Date of Birth: 1970-08-30  Subjective/Objective:    Metastatic disease to multiple organs                Action/Plan: NCM spoke to pt and provided with [facility] letter with copay override for medications. CSW spoke to pt and provided with resources. Pt states she does follow up with [facility] of [place] with Marliss Coots NP.   6:53 pm Pt states she trying to locate a place to go and has made several phone calls to family and friends. Will have Unit RN contact [facility] to provide pt with a taxi voucher once she secure a location. NCM faxed Rx to [facility], at time of pick up [facility] states they no longer do [place]. Provided pt with her Rx and explained she can pick up at [facility] Dept on tomorrow with her free Rx coverage or use [facility].     Expected Discharge Date:  01/29/18               Expected Discharge Plan:  Home/Self Care  In-House Referral:  Clinical Social Work  Discharge planning Services  CM Consult, [place] Program, Medication Assistance  Post Acute Care Choice:  NA Choice offered to:  Patient, NA  DME Arranged:  N/A DME Agency:  NA  HH Arranged:  NA HH Agency:  [place], NA  Status of Service:  Completed, signed off  If discussed at [place] of Stay Meetings, dates discussed:    Additional Comments:  Erenest Rasher, RN 01/29/2018, 4:43 PM

## 2018-01-29 NOTE — Progress Notes (Signed)
Patient discharge home at this time in no acute episode.

## 2018-01-29 NOTE — Progress Notes (Signed)
Pt was sitting up in bed when I arrived and having lunch. She wanted resources, which I supplied for her in addition to what [employer] had given her. Pt talked about her faith, the potential purpose for her illness and situation (housing and finances) which she described as transitions in life. Pt believes she will be okay and just to get through this. She believes she has been called to ministry and wants to explore becoming a Chaplain when she is better. Pt is strong in her faith and received prayer. Chaplain offered pastoral presence, listening and spiritual support. Please page if additional assistance is needed. [place], [place]   01/29/18 1400  Clinical Encounter Type  Visited With Patient

## 2018-01-29 NOTE — Clinical Social Work Note (Signed)
Clinical Social Work Assessment  Patient Details  Name: Kaitlyn Duncan MRN: [id number] Date of Birth: [birth year]  Date of referral:  01/29/18               Reason for consult:  Housing Concerns/Homelessness, Transportation                Permission sought to share information with:    Permission granted to share information::     Name::        Agency::     Relationship::     Contact Information:     Housing/Transportation Living arrangements for the past 2 months:  No permanent address, Hotel/Motel Source of Information:  Patient Patient Interpreter Needed:  None Criminal Activity/Legal Involvement Pertinent to Current Situation/Hospitalization:  No - Comment as needed Significant Relationships:  Church Lives with:  Self Do you feel safe going back to the place where you live?  (Patient is currently homeless) Need for family participation in patient care:  No (Coment)  Care giving concerns:  Patient currently staying in a hotel and is unsure if she able to return. Patient reported that she has been in [place] for the past 3 years and has not had stable housing. Patient reported that prior to staying in a hotel she was staying with a friend that put her out. Patient admitted with "brain metastasis, possible from malignant melanoma".    Social Worker assessment / plan:  CSW spoke with patient at bedside regarding consult for housing/transporation. Patient reported that she was staying at a hotel before hospitalization and that she is unsure if she can return there. Patient reported that her family was possibly taking care of the hotel but is waiting to hear back from them to see if she can return. Patient reported that the hotel is currently holding her personal belongings until she can come retrieve them. CSW inquired about patient contacting the hotel to see if her bill was paid and to see if she could return, patient reported that she wants to wait to hear back from her  family. 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 Patient reported that she has plenty of education but has not been able to secure a job. CSW asked patient if she was able to stay with any family or friends, patient reported that she is waiting to hear back from some church family to see if they are able to assist her. CSW provided patient with shelter resources and crisis assistance programs. Patient reported that she has been to shelters in the past and that it is not a "clean, stable environment". Patient reported that she is waiting to hear back from family about ability to return to hotel. CSW spoke with patient about transportation needs and provided resources, patient reported that she is currently using the city bus system to get around. CSW provided patient with bus pass for discharge. CSW inquired if patient had any additional questions, patient reported none.  CSW provided patient with housing and transportation resources. CSW signing off.   Employment status:  Unemployed Forensic scientist:  Self Pay (Medicaid Pending) PT Recommendations:  Not assessed at this time Information / Referral to community resources:  Shelter, Other (Comment Required)(Crisis Assistance Programs)  Patient/Family's Response to care:  Patient thanked CSW for resources provided and reported that a lot of the resources were  not applicable to her situation. CSW encouraged patient to follow up with crisis assistance programs to see if there were funds available to assist patient with securing housing.   Patient/Family's Understanding of and Emotional Response to Diagnosis, Current Treatment, and Prognosis:  Patient presented calm and verbalized understanding of current treatment plan. Patient spoke with CSW about her life and consistently referred to her life as a "journey".  Patient hopeful that she will hear back from church family to assist with temporary housing. Patient reported that she did not know she was as sick as she is and did not elaborate on new diagnosis.    Emotional Assessment Appearance:  Appears stated age Attitude/Demeanor/Rapport:  Engaged Affect (typically observed):  Calm Orientation:  Oriented to Self, Oriented to Place, Oriented to  Time, Oriented to Situation Alcohol / Substance use:  Not Applicable Psych involvement (Current and /or in the community):  No (Comment)  Discharge Needs  Concerns to be addressed:  Homelessness Readmission within the last 30 days:  No Current discharge risk:  Homeless Barriers to Discharge:  No Barriers Identified   HADDY MULLINAX, LCSW 01/29/2018, 3:26 PM

## 2018-01-29 NOTE — Discharge Summary (Signed)
Physician Discharge Summary  Maralee Higuchi OEU:[id number] DOB: 1971/04/21 DOA: 01/26/2018  PCP: Marliss Coots, NP  Admit date: 01/26/2018 Discharge date: 01/29/2018  Admitted From: Home (homeless) Disposition: Home (homeless)  Recommendations for Outpatient Follow-up:  1. Follow up with oncology and radiation oncology this upcoming week 2. Follow up biopsy results  Home Health: None Equipment/Devices: None  Discharge Condition: Stable CODE STATUS: Full code Diet recommendation: Regular diet   Brief/Interim Summary:  Admission HPI written by Bonnell Public, MD   Chief Complaint: Coffee-ground emesis  HPI:  Patient is a 47 year old Caucasian female with past medical history significant for diabetes mellitus, and anemia, seasonal allergies, and prior history of biopsy-proven malignant mole on right upper back around 2014.  Apparently, the patient could not tell me if she followed up after she was informed that the biopsy of the mole was malignant.  Patient has some medication as includes NSAIDs.  Patient was not forthcoming with history.  Patient presented with nausea, vomiting and coffee-ground emesis.  Stool occult blood came back positive.  Hemoglobin on presentation was 9.8 g/dL, with prior hemoglobin of 12.9 g/dL documented in August 2017.  No neck pain, no fever chills, no chest pain, no shortness of breath, no URI symptoms, no urinary symptoms and no joint problems.  CT scan of the head done revealed "hyperdense focus in the right parietal lobe".  MRI of the brain done revealed findings there with suspicious for metastatic melanoma to the brain.  Oncology team has been consulted, and patient will be admitted to [facility].  Patient will be started on seizure prophylaxis and IV Decadron.  Radiation oncology will be consulted at [facility].  Meanwhile, the ER physician has also consulted the GI team for the coffee-ground emesis.   ED Course:  Patient has been volume resuscitated.  GI, oncology teams have been consulted.  Protonix has been given.  Hospitalist team has been called to admit patient Pertinent labs: Kindly see above. Imaging: independently reviewed.    Hospital course:  Metastatic cancer Multiple sites including brain, lungs, peritoneum, adrenals. Likely melanoma. Started on decadron for cerebral edema and Keppra for seizure prophylaxis. Radiation and medical oncology consulted. Interventional radiology consulted for biopsy, which was performed on 7/26. Biopsy results pending. Outpatient management. Continued decadron and Keppra on discharge.  Coffee ground emesis Hemoglobin is stable. No recurrent episodes. EGD significant for large gastric mass. Biopsy obtained. Recommending Protonix 40 mg BID on discharge.  Blood when wiping Patient with concern for hematuria. Obtained urinalysis which was significant for hemoglobin with no evidence of RBCs on microscopy. Blood was not seen in urine but only when wiping. Blood likely vaginal in etiology as patient has a history of irregular periods. Hemoglobin stable. Urinalysis significant for hemoglobin with no RBCs. Uterine fibroids on CT. Recommend gyn follow-up at [facility].  Discharge Diagnoses:  Active Problems:   Brain metastasis [facility])    Discharge Instructions  Discharge Instructions    Call MD for:  extreme fatigue   Complete by:  As directed    Call MD for:  persistant dizziness or light-headedness   Complete by:  As directed    Call MD for:  persistant nausea and vomiting   Complete by:  As directed    Call MD for:  severe uncontrolled pain   Complete by:  As directed    Call MD for:  temperature >100.4   Complete by:  As directed    Increase activity slowly  Complete by:  As directed      Allergies as of 01/29/2018   No Known Allergies     Medication List    STOP taking these medications   ibuprofen 100 MG tablet Commonly known as:   ADVIL,MOTRIN     TAKE these medications   acetaminophen 325 MG tablet Commonly known as:  TYLENOL Take 650 mg by mouth every 6 (six) hours as needed.   dexamethasone 4 MG tablet Commonly known as:  DECADRON Take 1 tablet (4 mg total) by mouth every 6 (six) hours for 10 days.   levETIRAcetam 1000 MG tablet Commonly known as:  KEPPRA Take 1 tablet (1,000 mg total) by mouth 2 (two) times daily.   loratadine 10 MG tablet Commonly known as:  CLARITIN Take 10 mg by mouth daily.   multivitamin with minerals Tabs tablet Take 1 tablet by mouth daily.   oxybutynin 5 MG tablet Commonly known as:  DITROPAN Take 1 tablet (5 mg total) by mouth 3 (three) times daily.   pantoprazole 40 MG tablet Commonly known as:  PROTONIX Take 1 tablet (40 mg total) by mouth 2 (two) times daily.      Follow-up Information    [name], [name], NP. Schedule an appointment as soon as possible for a visit in 1 week(s).   Contact information: [place] [address] [phone]        Wyatt Portela, MD. Schedule an appointment as soon as possible for a visit in 1 day(s).   Specialty:  Oncology Contact information: [place] [address] [phone]        Tyler Pita, MD. Call in 1 day(s).   Specialty:  Radiation Oncology Why:  Brain radiation therapy Contact information: [address] [place] [address] [phone]          No Known Allergies  Consultations:  Medical oncology  Radiation oncology  Interventional radiology  Gastroenterology   Procedures/Studies: Ct Head Wo Contrast  Result Date: 01/26/2018 CLINICAL DATA:  Nausea and vomiting for 1 week EXAM: CT HEAD WITHOUT CONTRAST TECHNIQUE: Contiguous axial images were obtained from the base of the skull through the vertex without intravenous contrast. COMPARISON:  None. FINDINGS: Brain: There is an ill-defined hyperdense focus in the medial right parietal  lobe measuring approximately 1 cm. There is no surrounding vasogenic edema. There is no mass effect or midline shift. Ventricular system and extra-axial space are within normal limits. Vascular: No hyperdense vessel or unexpected calcification. Skull: The cranium is intact. Sinuses/Orbits: There is opacification of the left maxillary sinus with postoperative changes. Small mucous retention cyst in the right maxillary sinus. Nasal septum is deviated to the left. Orbits are within normal limits. Other: Noncontributory IMPRESSION: There is a hyperdense focus in the right parietal lobe. Differential diagnosis includes a hyperdense mass, cavernous angioma, or subacute hemorrhage. Initially, CT head with contrast may be helpful. Ultimately, brain MRI may be required. Electronically Signed   By: Marybelle Killings M.D.   On: 01/26/2018 14:10   Ct Chest W Contrast  Result Date: 01/26/2018 CLINICAL DATA:  Left mid abdominal pain, nausea and vomiting for the past week. Hematemesis yesterday. Intermittent right breast pain for the past 3 months. Stage IV melanoma. EXAM: CT CHEST, ABDOMEN, AND PELVIS WITH CONTRAST TECHNIQUE: Multidetector CT imaging of the chest, abdomen and pelvis was performed following the standard protocol during bolus administration of intravenous contrast. CONTRAST:  131mL OMNIPAQUE IOHEXOL 300 MG/ML  SOLN COMPARISON:  Abdomen radiographs obtained earlier today. Pelvic ultrasound dated 12/29/2009. FINDINGS: CT CHEST FINDINGS Cardiovascular: No significant vascular findings. Normal heart size. No pericardial effusion. Mediastinum/Nodes: Mildly enlarged bilateral axillary and retropectoral lymph nodes. These include a 9 mm short axis right axillary node on image number 14 series 3 and an 8 mm short axis retropectoral node on the left on image number 13 series 3. No enlarged hilar or mediastinal nodes. Thyroid gland, trachea, and esophagus demonstrate no significant findings. There is an incidentally noted 2  mm right lobe thyroid nodule. Lungs/Pleura: Large number of nodules throughout both lungs. The largest on the right is in the inferior aspect of the right upper lobe, measuring 1.5 x 1.0 cm on image number 63 series 5. The largest on the left is a left lower lobe nodule measuring 2.0 x 0.9 cm on image number 86 series 5. No pleural fluid. Musculoskeletal: Large number of small, rounded and oval, circumscribed subcutaneous nodules bilaterally. There are similar lodges in both breasts. The largest is inferior to the right breast laterally, measuring 2.4 x 1.3 cm on image number 43 series 3. Mild thoracic spine degenerative changes. Lower cervical spine degenerative changes. No bony metastases. A T12 hemangioma is noted. CT ABDOMEN PELVIS FINDINGS Hepatobiliary: Multiple noncalcified gallstones in the gallbladder. These are difficult to separate from each other. The largest individual visualized gallstone measures 8 mm. No gallbladder wall thickening or pericholecystic fluid. Unremarkable liver. Pancreas: Unremarkable. No pancreatic ductal dilatation or surrounding inflammatory changes. Spleen: 1.1 cm oval enhancing mass in the central spleen an additional smaller enhancing masses in that region. Adrenals/Urinary Tract: 2 masses in each adrenal gland. These are heterogeneous. These include a 3.7 x 2.8 cm right adrenal mass and 3.4 x 2.7 cm left adrenal mass on image number 58 of series 3. There is an additional 3.5 x 1.9 cm right adrenal mass and 1.7 x 1.3 cm left adrenal mass on image number 54 series 3. Normal appearing kidneys, ureters and urinary bladder. Stomach/Bowel: Unremarkable stomach, small bowel and colon. No evidence of appendicitis. Vascular/Lymphatic: No significant vascular findings are present. No enlarged abdominal or pelvic lymph nodes. Reproductive: 4.4 x 3.0 cm left myometrial mass. 1.5 x 1.43 cm fundal myometrial mass containing a small calcification. 5.0 x 4.4 cm right ovarian cyst containing low  to medium density fluid measuring 26 Hounsfield units in density. Unremarkable left ovary. Other: Large number of small, rounded and oval, circumscribed nodules scattered throughout the abdominal and pelvic peritoneal cavity and retroperitoneum. Large number of similar nodules in the subcutaneous fat bilaterally. These are most numerous in the perineal regions. Musculoskeletal: Right sacral bone island. L5-S1 degenerative changes. IMPRESSION: 1. Large number of metastases in the subcutaneous fat of the chest, abdomen pelvis. 2. Large number of metastases scattered throughout the peritoneum and retroperitoneum of the abdomen and pelvis. 3. Large number of lung metastases. 4. Mild bilateral axillary metastatic adenopathy. 5. Bilateral adrenal metastases. 6. Cholelithiasis. 7. Uterine fibroids. 8. Right ovarian probable endometrioma or hemorrhagic cyst, based on previous ultrasound findings. 9. 2 mm right lobe thyroid nodule. This is most likely an incidental benign nodule. A metastasis is less likely. Electronically Signed   By: Claudie Revering M.D.   On: 01/26/2018 20:41   Mr [name] ZO Contrast  Result Date: 01/28/2018 CLINICAL DATA:  SRS protocol for metastatic melanoma. EXAM: MRI HEAD WITHOUT AND WITH CONTRAST TECHNIQUE: Multiplanar, multiecho pulse sequences of the brain and surrounding structures were obtained without and with intravenous contrast. CONTRAST:  37mL  MULTIHANCE GADOBENATE DIMEGLUMINE 529 MG/ML IV SOLN COMPARISON:  Brain MRI from 2 days ago FINDINGS: Brain: Over 50 T1 hyperintense brain lesions consistent with metastatic disease. These are marked on precontrast series so as not to confuse vessel with lesion. T1 hyperintensity is from melanin and/or blood products, with gradient hypointensity. There is no associated edema or mass effect. The largest is in the right occipital lobe at 14 mm. Additional notable lesions are left occipital lobe at 7 mm and medial right thalamus is 7 mm. No infarct, acute  hemorrhage, hydrocephalus, or collection. Vascular: Major flow voids and vascular enhancements are preserved. Skull and upper cervical spine: No evidence of marrow lesion Sinuses/Orbits: Negative for mass Other: Innumerable small subcutaneous lesions, including a 24 mm ovoid lesion in the left intrinsic neck muscles. IMPRESSION: 1. Over 50 brain metastases, the largest measuring 14 mm in the right occipital lobe. No mass effect or vasogenic edema. 2. Innumerable subcutaneous lesions. Electronically Signed   By: Monte Fantasia M.D.   On: 01/28/2018 21:18   Mr [name] KX Contrast  Result Date: 01/26/2018 CLINICAL DATA:  Abnormal head CT. Nausea and vomiting and anemia. Headaches EXAM: MRI HEAD WITHOUT AND WITH CONTRAST TECHNIQUE: Multiplanar, multiecho pulse sequences of the brain and surrounding structures were obtained without and with intravenous contrast. CONTRAST:  22mL MULTIHANCE GADOBENATE DIMEGLUMINE 529 MG/ML IV SOLN COMPARISON:  CT head 01/26/2018 FINDINGS: Brain: Widespread small hemorrhagic lesions are present in the brain. Most these are very small and contain intrinsic T1 shortening due to methemoglobin or melanin. Many lesions also show enhancement suggesting underlying tumor. Largest areas of hemorrhage include the right occipital lobe measuring approximately 12 mm and right parietal lobe measuring 9 mm. Mild edema around the right parietal lesion which also shows enhancement. Approximately 17 lesions are identified in the brain. These are marked with arrows on the axial T1 post-contrast sequence. Ventricle size normal. No midline shift. No areas of acute infarction. No significant chronic ischemia. Vascular: Normal arterial flow voids Skull and upper cervical spine: No lesions in the calvarium identified. However, there are numerous small dermal lesions throughout the scalp and upper neck bilaterally. Many of these show enhancement and are suspicious for tumor deposits. Sinuses/Orbits: Mucosal  edema throughout the paranasal sinuses. Normal orbit Other: None IMPRESSION: Numerous small hemorrhagic lesions throughout the brain many of which show enhancement. Largest lesion in the right parietal lobe shows mild surrounding edema. Findings are highly suspicious for hemorrhagic metastatic disease such as melanoma. In addition, there are numerous enhancing lesions throughout the scalp and skin in the upper neck also suspicious for widespread tumor. These results were called by telephone at the time of interpretation on 01/26/2018 at 4:38 pm to Dr. Duffy Bruce , who verbally acknowledged these results. Electronically Signed   By: Franchot Gallo M.D.   On: 01/26/2018 16:39   Ct Abdomen Pelvis W Contrast  Result Date: 01/26/2018 CLINICAL DATA:  Left mid abdominal pain, nausea and vomiting for the past week. Hematemesis yesterday. Intermittent right breast pain for the past 3 months. Stage IV melanoma. EXAM: CT CHEST, ABDOMEN, AND PELVIS WITH CONTRAST TECHNIQUE: Multidetector CT imaging of the chest, abdomen and pelvis was performed following the standard protocol during bolus administration of intravenous contrast. CONTRAST:  113mL OMNIPAQUE IOHEXOL 300 MG/ML  SOLN COMPARISON:  Abdomen radiographs obtained earlier today. Pelvic ultrasound dated 12/29/2009. FINDINGS: CT CHEST FINDINGS Cardiovascular: No significant vascular findings. Normal heart size. No pericardial effusion. Mediastinum/Nodes: Mildly enlarged bilateral axillary and retropectoral lymph nodes.  These include a 9 mm short axis right axillary node on image number 14 series 3 and an 8 mm short axis retropectoral node on the left on image number 13 series 3. No enlarged hilar or mediastinal nodes. Thyroid gland, trachea, and esophagus demonstrate no significant findings. There is an incidentally noted 2 mm right lobe thyroid nodule. Lungs/Pleura: Large number of nodules throughout both lungs. The largest on the right is in the inferior aspect of  the right upper lobe, measuring 1.5 x 1.0 cm on image number 63 series 5. The largest on the left is a left lower lobe nodule measuring 2.0 x 0.9 cm on image number 86 series 5. No pleural fluid. Musculoskeletal: Large number of small, rounded and oval, circumscribed subcutaneous nodules bilaterally. There are similar lodges in both breasts. The largest is inferior to the right breast laterally, measuring 2.4 x 1.3 cm on image number 43 series 3. Mild thoracic spine degenerative changes. Lower cervical spine degenerative changes. No bony metastases. A T12 hemangioma is noted. CT ABDOMEN PELVIS FINDINGS Hepatobiliary: Multiple noncalcified gallstones in the gallbladder. These are difficult to separate from each other. The largest individual visualized gallstone measures 8 mm. No gallbladder wall thickening or pericholecystic fluid. Unremarkable liver. Pancreas: Unremarkable. No pancreatic ductal dilatation or surrounding inflammatory changes. Spleen: 1.1 cm oval enhancing mass in the central spleen an additional smaller enhancing masses in that region. Adrenals/Urinary Tract: 2 masses in each adrenal gland. These are heterogeneous. These include a 3.7 x 2.8 cm right adrenal mass and 3.4 x 2.7 cm left adrenal mass on image number 58 of series 3. There is an additional 3.5 x 1.9 cm right adrenal mass and 1.7 x 1.3 cm left adrenal mass on image number 54 series 3. Normal appearing kidneys, ureters and urinary bladder. Stomach/Bowel: Unremarkable stomach, small bowel and colon. No evidence of appendicitis. Vascular/Lymphatic: No significant vascular findings are present. No enlarged abdominal or pelvic lymph nodes. Reproductive: 4.4 x 3.0 cm left myometrial mass. 1.5 x 1.43 cm fundal myometrial mass containing a small calcification. 5.0 x 4.4 cm right ovarian cyst containing low to medium density fluid measuring 26 Hounsfield units in density. Unremarkable left ovary. Other: Large number of small, rounded and oval,  circumscribed nodules scattered throughout the abdominal and pelvic peritoneal cavity and retroperitoneum. Large number of similar nodules in the subcutaneous fat bilaterally. These are most numerous in the perineal regions. Musculoskeletal: Right sacral bone island. L5-S1 degenerative changes. IMPRESSION: 1. Large number of metastases in the subcutaneous fat of the chest, abdomen pelvis. 2. Large number of metastases scattered throughout the peritoneum and retroperitoneum of the abdomen and pelvis. 3. Large number of lung metastases. 4. Mild bilateral axillary metastatic adenopathy. 5. Bilateral adrenal metastases. 6. Cholelithiasis. 7. Uterine fibroids. 8. Right ovarian probable endometrioma or hemorrhagic cyst, based on previous ultrasound findings. 9. 2 mm right lobe thyroid nodule. This is most likely an incidental benign nodule. A metastasis is less likely. Electronically Signed   By: Claudie Revering M.D.   On: 01/26/2018 20:41   Dg Abd 2 Views  Result Date: 01/26/2018 CLINICAL DATA:  Nausea and vomiting. EXAM: ABDOMEN - 2 VIEW COMPARISON:  None. FINDINGS: The bowel gas pattern is normal. There is no evidence of free air. No radio-opaque calculi or other significant radiographic abnormality is seen. Probable bone island in the right sacral ala. No acute osseous abnormality. IMPRESSION: Negative. Electronically Signed   By: Titus Dubin M.D.   On: 01/26/2018 14:01   Ms Digital  Screening Tomo Bilateral  Result Date: 01/27/2018 CLINICAL DATA:  Screening. 47 year old female with known metastatic melanoma. Patient had a CT scan of the chest, abdomen and pelvis on 01/26/2018 which showed a large number of subcutaneous metastasis in the fat of the chest, abdomen and pelvis. EXAM: DIGITAL SCREENING BILATERAL MAMMOGRAM WITH TOMO AND CAD COMPARISON:  Previous exam(s). ACR Breast Density Category c: The breast tissue is heterogeneously dense, which may obscure small masses. FINDINGS: There are no findings  suspicious for breast malignancy. Innumerable rounded masses are seen bilaterally consistent with subcutaneous metastatic melanoma. Images were processed with CAD. IMPRESSION: Innumerable subcutaneous nodules in both breast consistent with metastatic melanoma. No mammographic evidence of breast malignancy. A result letter of this screening mammogram will be mailed directly to the patient. RECOMMENDATION: Screening mammogram in one year. (Code:SM-B-01Y) BI-RADS CATEGORY  1: Negative. Electronically Signed   By: Lillia Mountain M.D.   On: 01/27/2018 13:43   [place] Core Biopsy (soft Tissue)  Result Date: 01/27/2018 INDICATION: Melanoma, cutaneous metastases EXAM: Ultrasound right chest wall subcutaneous mass core biopsy MEDICATIONS: 1% lidocaine local ANESTHESIA/SEDATION: Moderate (conscious) sedation was employed during this procedure. A total of Versed 2.0 mg and Fentanyl 50 mcg was administered intravenously. Moderate Sedation Time: 10 minutes. The patient's level of consciousness and vital signs were monitored continuously by radiology nursing throughout the procedure under my direct supervision. FLUOROSCOPY TIME:  Fluoroscopy Time: None. COMPLICATIONS: None immediate. PROCEDURE: Informed written consent was obtained from the patient after a thorough discussion of the procedural risks, benefits and alternatives. All questions were addressed. Maximal Sterile Barrier Technique was utilized including caps, mask, sterile gowns, sterile gloves, sterile drape, hand hygiene and skin antiseptic. A timeout was performed prior to the initiation of the procedure. Previous imaging reviewed. Preliminary ultrasound performed. The right lateral chest 2 cm subcutaneous nodule by chest CT was localized with ultrasound just inferior to the right breast. Overlying skin marked. Under sterile conditions and local anesthesia, an 18 gauge core biopsy was advanced to the lesion. Needle position confirmed with ultrasound. 18 gauge core  biopsies obtained (4). Samples placed in formalin. Needle removed. Postprocedure imaging demonstrates no hemorrhage or hematoma. Patient tolerated the biopsy well. IMPRESSION: Successful ultrasound right lateral chest wall subcutaneous mass core biopsies Electronically Signed   By: Jerilynn Mages.  Shick M.D.   On: 01/27/2018 12:06     7/27: EGD Impression:               - Normal esophagus.                           - A few papules (nodules) found in the stomach.                            Biopsied.                           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                            Biopsied. Suspect melanoma                           - Nodule found in the duodenum.  Recommendation:           - Advance diet as tolerated.                           -  Continue present medications.                           - Use Protonix (pantoprazole) 40 mg PO BID.                           - Return patient to hospital ward for ongoing care.   Subjective: Patient worried about her numerous subcutaneous lesions. Has some lower pelvic pain. Noticed some blood when wiping after urination.  Discharge Exam: Vitals:   01/29/18 0420 01/29/18 1351  BP: 127/83 128/86  Pulse: 76 84  Resp: 16 16  Temp: 98 F (36.7 C) 99.4 F (37.4 C)  SpO2: 97% 99%   Vitals:   01/28/18 1430 01/28/18 2027 01/29/18 0420 01/29/18 1351  BP: 111/64 119/68 127/83 128/86  Pulse:  92 76 84  Resp:  16 16 16   Temp:  98.4 F (36.9 C) 98 F (36.7 C) 99.4 F (37.4 C)  TempSrc:  Oral Oral Oral  SpO2:  97% 97% 99%  Weight:      Height:        General: Pt is alert, awake, not in acute distress Cardiovascular: RRR, S1/S2 +, no rubs, no gallops Respiratory: CTA bilaterally, no wheezing, no rhonchi Abdominal: Soft, NT, ND, bowel sounds + Extremities: no edema, no cyanosis    The results of significant diagnostics from this hospitalization (including imaging, microbiology, ancillary and laboratory) are listed below for  reference.     Microbiology: No results found for this or any previous visit (from the past 240 hour(s)).   Labs: BNP (last 3 results) No results for input(s): BNP in the last 8760 hours. Basic Metabolic Panel: Recent Labs  Lab 01/26/18 1227 01/27/18 0650  NA 139 137  K 4.1 4.1  CL 106 106  CO2 25 23  GLUCOSE 145* 144*  BUN 19 20  CREATININE 0.77 0.56  CALCIUM 9.2 8.7*   Liver Function Tests: Recent Labs  Lab 01/26/18 1227  AST 26  ALT 14  ALKPHOS 79  BILITOT 0.6  PROT 7.1  ALBUMIN 3.8   No results for input(s): LIPASE, AMYLASE in the last 168 hours. No results for input(s): AMMONIA in the last 168 hours. CBC: Recent Labs  Lab 01/26/18 1227 01/27/18 0650 01/27/18 1007 01/27/18 1948 01/28/18 0329 01/28/18 1125 01/29/18 0350  WBC 7.6 7.0  --   --   --   --  12.1*  HGB 9.8* 9.5* 9.8* 9.3* 9.1* 9.1* 9.2*  HCT 33.3* 30.3* 31.5* 30.2* 29.8* 29.7* 30.3*  MCV 83.5 81.0  --   --   --   --  82.1  PLT 131* 133*  --   --   --   --  198   Cardiac Enzymes: No results for input(s): CKTOTAL, CKMB, CKMBINDEX, TROPONINI in the last 168 hours. BNP: Invalid input(s): POCBNP CBG: Recent Labs  Lab 01/27/18 1707 01/28/18 0000 01/28/18 0615 01/28/18 1231 01/29/18 1044  GLUCAP 230* 118* 141* 116* 207*   D-Dimer No results for input(s): DDIMER in the last 72 hours. Hgb A1c No results for input(s): HGBA1C in the last 72 hours. Lipid Profile No results for input(s): CHOL, HDL, LDLCALC, TRIG, CHOLHDL, LDLDIRECT in the last 72 hours. Thyroid function studies No results for input(s): TSH, T4TOTAL, T3FREE, THYROIDAB in the last 72 hours.  Invalid input(s): FREET3 Anemia work up No results for input(s): VITAMINB12, FOLATE, FERRITIN, TIBC, IRON, RETICCTPCT in  the last 72 hours. Urinalysis    Component Value Date/Time   COLORURINE YELLOW 01/28/2018 0905   APPEARANCEUR CLEAR 01/28/2018 0905   LABSPEC 1.019 01/28/2018 0905   PHURINE 5.0 01/28/2018 0905   GLUCOSEU  NEGATIVE 01/28/2018 0905   HGBUR MODERATE (A) 01/28/2018 0905   BILIRUBINUR NEGATIVE 01/28/2018 0905   KETONESUR NEGATIVE 01/28/2018 0905   PROTEINUR NEGATIVE 01/28/2018 0905   UROBILINOGEN 0.2 09/15/2009 0115   NITRITE NEGATIVE 01/28/2018 0905   LEUKOCYTESUR MODERATE (A) 01/28/2018 0905    SIGNED:   Cordelia Poche, MD [facility] 01/29/2018, 4:41 PM

## 2018-01-29 NOTE — Progress Notes (Signed)
Reviewed discharge information with patient. Answered all questions. Pt able to teach back medications and reasons to contact MD/911. Patient verbalizes importance of oncology and PCP follow up appointment. Bus passes provided [employer]. Brigitte Pulse, RN

## 2018-01-30 ENCOUNTER — Encounter (HOSPITAL_COMMUNITY): Payer: Self-pay | Admitting: Gastroenterology

## 2018-01-30 ENCOUNTER — Telehealth: Payer: Self-pay | Admitting: Oncology

## 2018-01-30 NOTE — Telephone Encounter (Signed)
Called pt re appt that was added per 7/28 sch msg - left vm for pt re appts.

## 2018-01-31 ENCOUNTER — Ambulatory Visit
Admission: RE | Admit: 2018-01-31 | Discharge: 2018-01-31 | Disposition: A | Discharge status: Home / Self Care | Payer: Medicaid Other | Source: Ambulatory Visit | Attending: Radiation Oncology | Admitting: Radiation Oncology

## 2018-01-31 DIAGNOSIS — C439 Malignant melanoma of skin, unspecified: Secondary | ICD-10-CM | POA: Insufficient documentation

## 2018-01-31 DIAGNOSIS — C7931 Secondary malignant neoplasm of brain: Secondary | ICD-10-CM | POA: Insufficient documentation

## 2018-01-31 NOTE — Progress Notes (Signed)
Originally patient scheduled for simulation today at 1100. Patient reports she is unable to make to days appointment since she has to take public transportation. Patient rescheduled for tomorrow. Reached out to Johnnye Lana, LCSW and requested social work team touch base with patient tomorrow before or after simulation.

## 2018-02-01 ENCOUNTER — Other Ambulatory Visit (HOSPITAL_COMMUNITY): Payer: Self-pay | Admitting: *Deleted

## 2018-02-01 ENCOUNTER — Ambulatory Visit
Admission: RE | Admit: 2018-02-01 | Discharge: 2018-02-01 | Disposition: A | Discharge status: Home / Self Care | Payer: Medicaid Other | Source: Ambulatory Visit | Attending: Radiation Oncology | Admitting: Radiation Oncology

## 2018-02-01 ENCOUNTER — Encounter: Payer: Self-pay | Admitting: General Practice

## 2018-02-01 ENCOUNTER — Telehealth: Payer: Self-pay | Admitting: *Deleted

## 2018-02-01 ENCOUNTER — Other Ambulatory Visit: Payer: Self-pay | Admitting: Oncology

## 2018-02-01 ENCOUNTER — Encounter: Payer: Self-pay | Admitting: Oncology

## 2018-02-01 DIAGNOSIS — Z0000 Encounter for general adult medical examination without abnormal findings: Secondary | ICD-10-CM

## 2018-02-01 DIAGNOSIS — C439 Malignant melanoma of skin, unspecified: Secondary | ICD-10-CM | POA: Diagnosis not present

## 2018-02-01 DIAGNOSIS — C7931 Secondary malignant neoplasm of brain: Secondary | ICD-10-CM | POA: Diagnosis not present

## 2018-02-01 DIAGNOSIS — C7949 Secondary malignant neoplasm of other parts of nervous system: Principal | ICD-10-CM

## 2018-02-01 NOTE — Progress Notes (Signed)
The results of the liver biopsy obtained on January 27, 2018 was discussed with Dr. Tamala Julian the reviewing pathologist.  The pathology confirmed the presence of metastatic melanoma which will complete her confirmatory diagnosis and staging.  She has stage IV melanoma with metastatic disease to the brain, lymphadenopathy and the liver.  She is currently undergoing radiation therapy and systemic therapy to be started in the near future depending on her BRAF status.

## 2018-02-01 NOTE — Progress Notes (Signed)
  Radiation Oncology         [phone] ________________________________  Name: Asha Grumbine MRN: [id number]  Date: 02/01/2018  DOB: 12/16/1970  SIMULATION AND TREATMENT PLANNING NOTE    ICD-10-CM   1. Secondary malignant neoplasm of brain and spinal cord (HCC) C79.31    C79.49   2. Brain metastasis ([place]) C79.31     DIAGNOSIS:  47 yo woman with over 50 brain metastases from stage IV melanoma  NARRATIVE:  The patient was brought to the [place].  Identity was confirmed.  All relevant records and images related to the planned course of therapy were reviewed.  The patient freely provided informed written consent to proceed with treatment after reviewing the details related to the planned course of therapy. The consent form was witnessed and verified by the simulation staff.  Then, the patient was set-up in a stable reproducible  supine position for radiation therapy.  CT images were obtained.  Surface markings were placed.  The CT images were loaded into the planning software.  Then the target and avoidance structures were contoured.  Treatment planning then occurred.  The radiation prescription was entered and confirmed.  Then, I designed and supervised the construction of a total of 3 medically necessary complex treatment devices, including a custom made thermoplastic mask used for immobilization and two complex multileaf collimators to cover the entire intracranial contents, while shielding the eyes and face.  Each [facility] is independently created to account for beam divergence.  The right and left lateral fields will be treated with 6 MV X-rays.  I have requested : Isodose Plan.    PLAN:  The whole brain will be treated to 30 Gy in 10 fractions.  ________________________________  Sheral Apley Tammi Klippel, M.D.

## 2018-02-01 NOTE — Addendum Note (Signed)
Encounter addended by: Tyler Pita, MD on: 02/01/2018 7:15 PM  Actions taken: Medication List reviewed, Allergies reviewed, Problem List reviewed, Visit diagnoses modified, Sign clinical note

## 2018-02-01 NOTE — Telephone Encounter (Signed)
LM on patient's answering machine, per dr Alen Blew, that she may come in at 11:00 on 02/06/18 instead of 8:30, per her request

## 2018-02-01 NOTE — Progress Notes (Signed)
[place] CSW Progress Notes  Referral received from RN stating that patient has multiple needs including no housing, no transportation, no income or insurance.  Very limited social support.  Has been part of two local churches who have paid motel bills off/on for past several months.  Per record, patient has been in this area for 3 years and has been transient throughout that time.  When asked, patient states she "cannot remember the last time she had stable housing."  Recently discharged from inpatient at [facility].  Briefly spoke w patient prior to CT Sim.  Completed referral for disability help to [facility], referral sent and CSW spoke w Anderson Malta to explain necessity for expedited review due to disease and concurrent homelessness.  Per [facility], record will need to reflect current staging of cancer and recent imaging is helpful in process of expediting review for determination of disability.    Chart review indicates patient has Benita Gutter at [facility] as PCP, staff message sent requesting collateral information and to explore any other avenues for housing patient.  Patient has also been linked w [facility] in past.  Discussed case w Chaplain [place] who reached out to [facility] for resources, options are TBD.  CSW called local shelters - all are full and cannot accommodate patient.  CSW called Mayer Masker, SW Dept Director to inquire about [employer] which has provided temporary housing options in past -program is out of funds at this time.  CSW will communicate the above to patient.  Will encourage patient to attempt to find shelter for this evening and will brief oncoming CSW Wallis Bamberg of need to coordinate options w [place] and [place], [place] Worker Phone:  [phone]

## 2018-02-01 NOTE — Telephone Encounter (Signed)
Patient calling to say she is concerned because she is having some dizziness, lightheadedness and is tired. C/o pain in feet and ankles especially right side. State she has an appt on 02/06/18 with dr Alen Blew @ 8:30, but States she uses public transportation and would like a later appt if possible, this week or next week.

## 2018-02-01 NOTE — Telephone Encounter (Signed)
She can come at 11:00 instead of 8:30 on 8/5.

## 2018-02-02 DIAGNOSIS — C439 Malignant melanoma of skin, unspecified: Secondary | ICD-10-CM | POA: Insufficient documentation

## 2018-02-02 DIAGNOSIS — C7931 Secondary malignant neoplasm of brain: Secondary | ICD-10-CM | POA: Diagnosis present

## 2018-02-02 DIAGNOSIS — Z510 Encounter for antineoplastic radiation therapy: Secondary | ICD-10-CM | POA: Insufficient documentation

## 2018-02-02 NOTE — Addendum Note (Signed)
Addended by: Stephenie Acres on: 02/02/2018 03:41 PM   Modules accepted: Orders

## 2018-02-03 ENCOUNTER — Encounter: Payer: Self-pay | Admitting: *Deleted

## 2018-02-03 ENCOUNTER — Encounter: Payer: Self-pay | Admitting: General Practice

## 2018-02-03 ENCOUNTER — Other Ambulatory Visit: Payer: Self-pay | Admitting: Urology

## 2018-02-03 ENCOUNTER — Inpatient Hospital Stay: Payer: Medicaid Other

## 2018-02-03 ENCOUNTER — Inpatient Hospital Stay: Payer: Medicaid Other | Attending: Obstetrics and Gynecology | Admitting: *Deleted

## 2018-02-03 VITALS — BP 110/70 | Ht 60.0 in | Wt 163.9 lb

## 2018-02-03 DIAGNOSIS — C7989 Secondary malignant neoplasm of other specified sites: Secondary | ICD-10-CM | POA: Insufficient documentation

## 2018-02-03 DIAGNOSIS — Z809 Family history of malignant neoplasm, unspecified: Secondary | ICD-10-CM | POA: Insufficient documentation

## 2018-02-03 DIAGNOSIS — C7800 Secondary malignant neoplasm of unspecified lung: Secondary | ICD-10-CM | POA: Insufficient documentation

## 2018-02-03 DIAGNOSIS — Z0000 Encounter for general adult medical examination without abnormal findings: Secondary | ICD-10-CM

## 2018-02-03 DIAGNOSIS — C7931 Secondary malignant neoplasm of brain: Secondary | ICD-10-CM | POA: Insufficient documentation

## 2018-02-03 DIAGNOSIS — C786 Secondary malignant neoplasm of retroperitoneum and peritoneum: Secondary | ICD-10-CM | POA: Insufficient documentation

## 2018-02-03 DIAGNOSIS — C439 Malignant melanoma of skin, unspecified: Secondary | ICD-10-CM | POA: Insufficient documentation

## 2018-02-03 LAB — LIPID PANEL
CHOL/HDL RATIO: 4 ratio
Cholesterol: 176 mg/dL (ref 0–200)
HDL: 44 mg/dL (ref >40)
LDL CALC: 91 mg/dL (ref 0–99)
TRIGLYCERIDES: 203 mg/dL — AB (ref <150)
VLDL: 41 mg/dL — ABNORMAL HIGH (ref 0–40)

## 2018-02-03 LAB — HEMOGLOBIN A1C
Hgb A1c MFr Bld: 5.8 % — ABNORMAL HIGH (ref 4.8–5.6)
Mean Plasma Glucose: 119.76 mg/dL

## 2018-02-03 MED ORDER — LEVETIRACETAM 1000 MG PO TABS: 1000.0000 mg | ORAL_TABLET | Freq: Two times a day (BID) | ORAL | 2 refills | Status: DC

## 2018-02-03 MED ORDER — DEXAMETHASONE 4 MG PO TABS: 4.0000 mg | ORAL_TABLET | Freq: Four times a day (QID) | ORAL | 0 refills | Status: DC

## 2018-02-03 MED ORDER — OXYBUTYNIN CHLORIDE 5 MG PO TABS: 5.0000 mg | ORAL_TABLET | Freq: Three times a day (TID) | ORAL | 5 refills | Status: DC

## 2018-02-03 MED FILL — OXYBUTYNIN CHLORIDE 5 MG TA: 5 | 30 days supply | Qty: 90 | Fill #0

## 2018-02-03 MED FILL — levETIRAcetam 1000 MG TABS: 1000 | 30 days supply | Qty: 60 | Fill #0

## 2018-02-03 MED FILL — DEXAMETHASONE 4 MG TABLET: 4 | 15 days supply | Qty: 60 | Fill #0

## 2018-02-03 NOTE — Progress Notes (Signed)
[place] [employer] representative met with patient and chaplain to offer support and assess for needs.  Financial advocate, Jodelle Green, joined group during beginning of session and reviewed [place] guidelines.  Patient was approved for assistance with medications prescribed by [facility] oncologists.    CSW and chaplain spent remainder of session exploring patient's background and strengths, practical concerns, and understanding of current medical illness.  Patient shared her strong faith in [name] as primary source of strength, was unable to identify primary support individuals but relies heavily on her "church family".    Patient's main concerns at this time are practical concerns- patient homeless, no income, and no insurance.  Ms. Alwine father, stepmother, and sister are currently in town from [place] and patient is staying in hotel with them over the weekend.  The patient is familiar with community resources and does not feel a shelter is suitable for her at this time.  CSW arranged for patient to meet with [facility] on Monday after treatment to complete disability assistance.  CSW will follow up with patient at that time.      [name], LCSW  Clinical Social Worker [place]

## 2018-02-03 NOTE — Progress Notes (Signed)
[facility] initial screening  Clinical Measurement:  Height:  60in Weight: 163.9lb  Blood Pressure: 108/76 Blood Pressure #2: 110/70  Fasting Labs Drawn Today, will review with patient when they result.  Medical History:  Patient states that she has not been diagnosed with high cholesterol, high blood pressure, diabetes or heart disease.  Medications:  Patients states she is not taking any medications for high cholesterol, high blood pressure or diabetes.  She is not taking aspirin daily to prevent heart attack or stroke.    Blood pressure, self measurement:  Patients states she does not measure blood pressure at home.    Nutrition:  Patient states she eats 0 cups of fruit and 0.5 cups of vegetables in an average day.  Patient states she does not eat fish regularly, she eats less than half a serving of whole grains daily. She drinks more than 36 ounces of beverages with added sugar weekly.  She is currently watching her sodium intake.  She has not had any drinks containing alcohol in the last seven days.    Physical activity:  Patient states that she gets 210 minutes of moderate exercise in a week.  She gets 0 minutes of vigorous exercise per week.    Smoking status:  Patient states she has never smoked and is not around any smokers.    Quality of life:  Patient states that she has had a few bad physical days out of the last 30 days due to a recent diagnosis.  In the last 2 weeks, she has had 0 days that she has felt down or depressed. She has had 0 days in the last 2 weeks that she has had little interest or pleasure in doing things.  Risk reduction and counseling:  Patient states she wants to lose weight and increase fruit and vegetable intake.  I encouraged her to continue with current exercise regimen and increase vegetable and fruit intake.  Navigation:  I will notify patient of lab results.  Patient is aware of 2 more health coaching sessions and a follow up.

## 2018-02-03 NOTE — Progress Notes (Signed)
Error.    [name], LCSW  Clinical Social Worker [facility]

## 2018-02-03 NOTE — Progress Notes (Signed)
[place] Spiritual Care Note  Met with Earnest Bailey Hobgood/financial advocate, and Lauren Somers/LCSW to establish rapport, learn about [name]'s needs, help connect her with related resources.   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 Per pt, she is currently staying at a motel with her family (father, stepmother, sister) who apparently came somewhat unexpectedly from [place] for a supportive visit this weekend before her xrt regimen starts Monday, but otherwise has no leads for other housing. Per pt, she has had unstable housing for the last few years and limited seasonal work through [employer], a [employer]. Faith is an important part of her meaning-making, and she hopes to be able to access healing arts programs through [employer].  Provided empathic listening, pastoral reflection, and information about a one-time emergency housing resource through [place], which she could utilize for a maximum of two weeks, with reassessment after one week. We encouraged [name] to utilize family support first; at this time, she has applied to access this emergency housing as of Monday, but is aware that she could seek a later start date if family support continues longer.  [name] knows to [employer] team as needed with questions or additional resource needs, but please also page if immediate needs arise or circumstances change. Thank you.  [place], [place], [facility] Pager [phone] Voicemail [phone]

## 2018-02-06 ENCOUNTER — Encounter: Payer: Self-pay | Admitting: Oncology

## 2018-02-06 ENCOUNTER — Inpatient Hospital Stay (HOSPITAL_BASED_OUTPATIENT_CLINIC_OR_DEPARTMENT_OTHER): Payer: Medicaid Other | Admitting: Oncology

## 2018-02-06 ENCOUNTER — Encounter: Payer: Self-pay | Admitting: *Deleted

## 2018-02-06 ENCOUNTER — Telehealth (HOSPITAL_COMMUNITY): Payer: Self-pay | Admitting: *Deleted

## 2018-02-06 ENCOUNTER — Ambulatory Visit
Admission: RE | Admit: 2018-02-06 | Discharge: 2018-02-06 | Disposition: A | Discharge status: Home / Self Care | Payer: Medicaid Other | Source: Ambulatory Visit | Attending: Radiation Oncology | Admitting: Radiation Oncology

## 2018-02-06 ENCOUNTER — Telehealth: Payer: Self-pay | Admitting: Oncology

## 2018-02-06 VITALS — BP 133/82 | HR 110 | Temp 99.0°F | Resp 18 | Ht 60.0 in | Wt 168.5 lb

## 2018-02-06 DIAGNOSIS — C7989 Secondary malignant neoplasm of other specified sites: Secondary | ICD-10-CM

## 2018-02-06 DIAGNOSIS — C7800 Secondary malignant neoplasm of unspecified lung: Secondary | ICD-10-CM | POA: Diagnosis not present

## 2018-02-06 DIAGNOSIS — Z510 Encounter for antineoplastic radiation therapy: Secondary | ICD-10-CM | POA: Diagnosis not present

## 2018-02-06 DIAGNOSIS — C439 Malignant melanoma of skin, unspecified: Secondary | ICD-10-CM | POA: Diagnosis present

## 2018-02-06 DIAGNOSIS — C786 Secondary malignant neoplasm of retroperitoneum and peritoneum: Secondary | ICD-10-CM

## 2018-02-06 DIAGNOSIS — Z809 Family history of malignant neoplasm, unspecified: Secondary | ICD-10-CM

## 2018-02-06 DIAGNOSIS — C7931 Secondary malignant neoplasm of brain: Secondary | ICD-10-CM

## 2018-02-06 NOTE — Telephone Encounter (Signed)
Appointments scheduled AVS/Calendar printed per 8/5 los °

## 2018-02-06 NOTE — Progress Notes (Signed)
Hematology and Oncology Follow Up Visit  Kaitlyn Duncan [id number] [birth year] 47 y.o. 02/06/2018 10:57 AM Duncan, Kaitlyn Muscat, NPPlacey, Kaitlyn Muscat, NP   Principle Diagnosis: 47 year old woman with metastatic melanoma diagnosed in July 2019.  She presented with subcutaneous nodules, peritoneal involvement, lung metastasis, GI track involvement as well as brain metastasis.   Prior Therapy: He is status post subcutaneous nodule biopsy on 01/27/2018 which confirmed the presence of metastatic melanoma. She status post endoscopy on 01/28/2018 which showed subcutaneous involvement that consistent with metastatic melanoma.  Current therapy:  Whole brain radiation for total of 30 Gy in 10 fractions to start on 02/07/2018.  Interim History: Kaitlyn Duncan presents today for a follow-up visit.  She is a pleasant woman I saw in consultation on 01/27/2018.  She was discharged on 01/29/2018 to start radiation therapy in the near future.  Since her discharge, she reports feeling reasonably fair.  She has been eating well and have gained weight on dexamethasone.  She denies any alteration in mental status or confusion.  She denies any hematochezia or melena.  She denies any abdominal distention.  She is able to ambulate without any difficulties.  She did not report any falls or syncope.  She does not report any headaches, blurry vision, syncope or seizures. Does not report any fevers, chills or sweats.  Does not report any cough, wheezing or hemoptysis.  Does not report any chest pain, palpitation, orthopnea or leg edema.  Does not report any nausea, vomiting or abdominal pain.  Does not report any constipation or diarrhea.  Does not report any skeletal complaints.    Does not report frequency, urgency or hematuria.  Does not report any skin rashes or lesions. Does not report any heat or cold intolerance.  Does not report any lymphadenopathy or petechiae.  Does not report any anxiety or depression.  Remaining review of  systems is negative.    Medications: I have reviewed the patient's current medications.  Current Outpatient Medications  Medication Sig Dispense Refill  . acetaminophen (TYLENOL) 325 MG tablet Take 650 mg by mouth every 6 (six) hours as needed.    Marland Kitchen dexamethasone (DECADRON) 4 MG tablet Take 1 tablet (4 mg total) by mouth every 6 (six) hours. 60 tablet 0  . levETIRAcetam (KEPPRA) 1000 MG tablet Take 1 tablet (1,000 mg total) by mouth 2 (two) times daily. 60 tablet 2  . loratadine (CLARITIN) 10 MG tablet Take 10 mg by mouth daily.    . Multiple Vitamin (MULTIVITAMIN WITH MINERALS) TABS tablet Take 1 tablet by mouth daily.    Marland Kitchen oxybutynin (DITROPAN) 5 MG tablet Take 1 tablet (5 mg total) by mouth 3 (three) times daily. 90 tablet 5  . pantoprazole (PROTONIX) 40 MG tablet Take 1 tablet (40 mg total) by mouth 2 (two) times daily. 60 tablet 0   No current facility-administered medications for this visit.      Allergies: No Known Allergies  Past Medical History, Surgical history, Social history, and Family History were reviewed and updated.  Review of Systems:    Physical Exam: Blood pressure 133/82, pulse (!) 110, temperature 99 F (37.2 C), temperature source Oral, resp. rate 18, height 5' (1.524 m), weight 168 lb 8 oz (76.4 kg), last menstrual period 01/15/2018, SpO2 99 %.   ECOG: 1 General appearance: alert and cooperative appeared without distress. Head: Normocephalic, without obvious abnormality Oropharynx: No oral thrush or ulcers. Eyes: No scleral icterus.  Pupils are equal and round reactive to light. Lymph nodes:  No cervical, supraclavicular adenopathy but axillary adenopathy palpated. Heart:regular rate and rhythm, S1, S2 normal, no murmur, click, rub or gallop Lung:chest clear, no wheezing, rales, normal symmetric air entry Abdomin: soft, non-tender, without masses or organomegaly. Neurological: No motor, sensory deficits.  Intact deep tendon reflexes. Skin: Subcutaneous  nodules palpated on her scalp. Musculoskeletal: No joint deformity or effusion. Psychiatric: Mood and affect are appropriate.    Lab Results: Lab Results  Component Value Date   WBC 12.1 (H) 01/29/2018   HGB 9.2 (L) 01/29/2018   HCT 30.3 (L) 01/29/2018   MCV 82.1 01/29/2018   PLT 198 01/29/2018     Chemistry      Component Value Date/Time   NA 137 01/27/2018 0650   K 4.1 01/27/2018 0650   CL 106 01/27/2018 0650   CO2 23 01/27/2018 0650   BUN 20 01/27/2018 0650   CREATININE 0.56 01/27/2018 0650      Component Value Date/Time   CALCIUM 8.7 (L) 01/27/2018 0650   ALKPHOS 79 01/26/2018 1227   AST 26 01/26/2018 1227   ALT 14 01/26/2018 1227   BILITOT 0.6 01/26/2018 1227        Impression and Plan:   47 year old woman with the following issues:  1.  Metastatic melanoma diagnosed in July 2019 after presenting with GI bleeding and found to have widespread disease including CNS metastasis, bulky adenopathy, peritoneal involvement as well as lung nodules.  She is biopsy-proven to be metastatic melanoma with BRAF status is pending.  The natural course of this disease was discussed today with the patient and her family that accompanied her today from out of town.  Her disease unfortunately is very aggressive with widespread involvement as mentioned above.  Any treatment approach at this time would be palliative with chance of cure is very minimal.  However, she has an excellent performance status and she is minimally symptomatic from her disease and aggressive therapy will be warranted.  Her therapy approach will be dictated by her BRAF status.  If she has mutated tumor, oral targeted therapy with BRAF and MEK inhibition would be the first approach given her high volume disease and symptoms.  The rationale for using these drugs were discussed today.  Complications associated with this therapy was reviewed and written information was given to the patient.  These complications include  fatigue, tiredness, pruritus, fever, liver function abnormalities and cardiac toxicities.  Transitioning to immunotherapy would be the next step after treating her for a period of time in the critical phase.  If she is BRAF negative, then combined immunotherapy would be the next approach utilizing ipilimumab and nivolumab.  The logistics of administration of these drugs were discussed today.  Complications: Nausea, fatigue, pruritus, pneumonitis, colitis, thyroid disease among others.  The plan is to wait for the status of the mutation and start therapy immediately upon the completion of radiation therapy.  2.  CNS metastasis: She will start whole brain radiation to complete total of 10 fractions which tentatively concludes on 02/17/2018.  She is currently on dexamethasone 4 mg 3 times daily.  I recommend tapering dexamethasone down as fast as possible in case she is due to receive immunotherapy which would be helpful to have the dexamethasone dose as well as possible.  3.  Prognosis: Discussed today in detail with the patient and her family.  She understands she has an incurable malignancy but disease that can be palliated at least for the time being.  4.  Psychosocial consideration: Her social situation  is very challenging she is currently homeless with no income and no local family.  Her father accompanied her today on her visit from [place].  I outlined to the patient and her family difficulties that she may be facing and because of her medical illness.  Having family support is paramount if it is possible.  Her being treated close to home might be beneficial to her overall outcome.  This was reiterated again today.  5.  Genetic consideration: Her mother had metastatic melanoma.  She has a younger sister whom whom I encouraged to get checked periodically.  Genetic counseling would be beneficial in her case.  6.  Dermatology surveillance: We will make appropriate referrals for her dermatology  follow-up in the near future.  7.  GI bleed: No signs or symptoms of any bleeding at this time.  We will continue to monitor clinically.  8.  Follow-up: We will be in 2 weeks to follow her progress.  40  minutes was spent with the patient face-to-face today.  More than 50% of time was dedicated to discussing the natural course of this disease as well as discussing multitude of options to treat this condition.  Discussion also included discussing side effects associated with this medication as well as psychosocial consideration as well as prognosis.   Zola Button, MD 8/5/201910:57 AM

## 2018-02-06 NOTE — Telephone Encounter (Signed)
  Health Coaching 2   Labs- cholesterol 176, HDL 44, LDL 91, triglycerides 203, hemoglobin A1C 5.8, mean plasma glucose 119.76   Patient is aware and understands these lab results.  Goals-  Patients states she is changing her diet from eating processed meats to eating more fresh fruits and vegetables.  Patient states she has reduced her consumption of sugary beverages.  Patient states she is walking more, at least 30 minutes daily.  Navigation:  I will notify patient of lab results.  Patient is aware of 1 more health coaching sessions and a follow up.  Time- 10 minutes

## 2018-02-07 ENCOUNTER — Ambulatory Visit
Admission: RE | Admit: 2018-02-07 | Discharge: 2018-02-07 | Disposition: A | Discharge status: Home / Self Care | Payer: Medicaid Other | Source: Ambulatory Visit | Attending: Radiation Oncology | Admitting: Radiation Oncology

## 2018-02-07 ENCOUNTER — Encounter (HOSPITAL_COMMUNITY): Payer: Self-pay | Admitting: Family Medicine

## 2018-02-07 ENCOUNTER — Encounter (HOSPITAL_COMMUNITY): Payer: Self-pay | Admitting: Specialist

## 2018-02-07 DIAGNOSIS — Z510 Encounter for antineoplastic radiation therapy: Secondary | ICD-10-CM | POA: Diagnosis not present

## 2018-02-08 ENCOUNTER — Ambulatory Visit
Admission: RE | Admit: 2018-02-08 | Discharge: 2018-02-08 | Disposition: A | Discharge status: Home / Self Care | Payer: Medicaid Other | Source: Ambulatory Visit | Attending: Radiation Oncology | Admitting: Radiation Oncology

## 2018-02-08 DIAGNOSIS — Z510 Encounter for antineoplastic radiation therapy: Secondary | ICD-10-CM | POA: Diagnosis not present

## 2018-02-08 NOTE — Progress Notes (Signed)
[place] Clinical Social Work  Patient arrived to Support center to meet with Arrie Senate with [facility] disability assistance.  Patient's father and stepmother acconmpanied patient to medical oncology visit today and requested to speak with CSW privately.  CSW obtained permission from Ms. Steptoe to meet with patient's family.  Patient's parents requested information on housing resources and shared insight on their relationship with their daughter.  CSW provided information on [lack of] housing resources in our community.  CSW provided emotional support and generally discussed needs of a patient going through cancer treatment.  CSW did not provide specific information pertaining to patient, as patient has not provided consent to share personal information. Ms. Folger parents were appreciative of visit today.  Patient's father is Alayziah Tangeman (phone # [phone]) and step mother is Emelin Dascenzo (phone # [phone]).  Patient received housing assistance through [facility].  Patient verbalized understanding this option would only be available for the next TWO weeks.   [name], LCSW  Clinical Social Worker [facility]

## 2018-02-09 ENCOUNTER — Ambulatory Visit
Admission: RE | Admit: 2018-02-09 | Discharge: 2018-02-09 | Disposition: A | Discharge status: Home / Self Care | Payer: Medicaid Other | Source: Ambulatory Visit | Attending: Radiation Oncology | Admitting: Radiation Oncology

## 2018-02-09 DIAGNOSIS — Z510 Encounter for antineoplastic radiation therapy: Secondary | ICD-10-CM | POA: Diagnosis not present

## 2018-02-10 ENCOUNTER — Ambulatory Visit
Admission: RE | Admit: 2018-02-10 | Discharge: 2018-02-10 | Disposition: A | Discharge status: Home / Self Care | Payer: Medicaid Other | Source: Ambulatory Visit | Attending: Radiation Oncology | Admitting: Radiation Oncology

## 2018-02-10 DIAGNOSIS — Z510 Encounter for antineoplastic radiation therapy: Secondary | ICD-10-CM | POA: Diagnosis not present

## 2018-02-13 ENCOUNTER — Telehealth: Payer: Self-pay | Admitting: Oncology

## 2018-02-13 ENCOUNTER — Telehealth: Payer: Self-pay | Admitting: *Deleted

## 2018-02-13 ENCOUNTER — Ambulatory Visit
Admission: RE | Admit: 2018-02-13 | Discharge: 2018-02-13 | Disposition: A | Discharge status: Home / Self Care | Payer: Medicaid Other | Source: Ambulatory Visit | Attending: Radiation Oncology | Admitting: Radiation Oncology

## 2018-02-13 DIAGNOSIS — Z510 Encounter for antineoplastic radiation therapy: Secondary | ICD-10-CM | POA: Diagnosis not present

## 2018-02-13 NOTE — Telephone Encounter (Signed)
Patient called and stated,"I've been experiencing a lot of abdominal pain, gas and indigestion for a while. I've been taking Protonix twice a day. I also have right knee pain. Would this cause gas? She denied numbness and tingling to right leg and knee. She then stated, I do have tingling and numbness in my left hand, usually in the morning. Per Sandi Mealy, she needs to be seen in [facility] tomorrow. No labs needed. LOS sent to scheduling. She will be seen after her Radiology appointment. She verbalized understanding.

## 2018-02-13 NOTE — Telephone Encounter (Signed)
Patient scheduled per 8/12 sch message. Patient aware of date and time.

## 2018-02-14 ENCOUNTER — Inpatient Hospital Stay (HOSPITAL_BASED_OUTPATIENT_CLINIC_OR_DEPARTMENT_OTHER): Payer: Medicaid Other | Admitting: Medical

## 2018-02-14 ENCOUNTER — Ambulatory Visit
Admission: RE | Admit: 2018-02-14 | Discharge: 2018-02-14 | Disposition: A | Discharge status: Home / Self Care | Payer: Medicaid Other | Source: Ambulatory Visit | Attending: Radiation Oncology | Admitting: Radiation Oncology

## 2018-02-14 VITALS — BP 113/68 | HR 79 | Temp 99.2°F | Resp 18 | Ht 60.0 in | Wt 156.4 lb

## 2018-02-14 DIAGNOSIS — C786 Secondary malignant neoplasm of retroperitoneum and peritoneum: Secondary | ICD-10-CM | POA: Diagnosis not present

## 2018-02-14 DIAGNOSIS — C439 Malignant melanoma of skin, unspecified: Secondary | ICD-10-CM

## 2018-02-14 DIAGNOSIS — C7800 Secondary malignant neoplasm of unspecified lung: Secondary | ICD-10-CM | POA: Diagnosis not present

## 2018-02-14 DIAGNOSIS — C7889 Secondary malignant neoplasm of other digestive organs: Secondary | ICD-10-CM

## 2018-02-14 DIAGNOSIS — B370 Candidal stomatitis: Secondary | ICD-10-CM

## 2018-02-14 DIAGNOSIS — C7931 Secondary malignant neoplasm of brain: Secondary | ICD-10-CM

## 2018-02-14 DIAGNOSIS — Z510 Encounter for antineoplastic radiation therapy: Secondary | ICD-10-CM | POA: Diagnosis not present

## 2018-02-14 DIAGNOSIS — E119 Type 2 diabetes mellitus without complications: Secondary | ICD-10-CM

## 2018-02-14 MED ORDER — FLUCONAZOLE 100 MG PO TABS
100.0000 mg | ORAL_TABLET | Freq: Once | ORAL | 0 refills | Status: AC
Start: 2018-02-14 — End: 2018-02-14

## 2018-02-14 MED FILL — FLUCONAZOLE 100 MG TAB: 100 | 7 days supply | Qty: 7 | Fill #0

## 2018-02-14 NOTE — Patient Instructions (Signed)

## 2018-02-15 ENCOUNTER — Telehealth: Payer: Self-pay | Admitting: General Practice

## 2018-02-15 ENCOUNTER — Ambulatory Visit
Admission: RE | Admit: 2018-02-15 | Discharge: 2018-02-15 | Disposition: A | Discharge status: Home / Self Care | Payer: Medicaid Other | Source: Ambulatory Visit | Attending: Radiation Oncology | Admitting: Radiation Oncology

## 2018-02-15 DIAGNOSIS — Z510 Encounter for antineoplastic radiation therapy: Secondary | ICD-10-CM | POA: Diagnosis not present

## 2018-02-15 NOTE — Telephone Encounter (Signed)
[place] needs to have patient sign additional form, want to come to meet w patient tomorrow 8/15 prior to radiation treatment.  Attempted to reach patient however VM is full, unable to leave message.    Edwyna Shell, LCSW Clinical Social Worker Phone:  [phone]

## 2018-02-16 ENCOUNTER — Ambulatory Visit
Admission: RE | Admit: 2018-02-16 | Discharge: 2018-02-16 | Disposition: A | Discharge status: Home / Self Care | Payer: Medicaid Other | Source: Ambulatory Visit | Attending: Radiation Oncology | Admitting: Radiation Oncology

## 2018-02-16 ENCOUNTER — Encounter: Payer: Self-pay | Admitting: *Deleted

## 2018-02-16 ENCOUNTER — Other Ambulatory Visit: Payer: Self-pay | Admitting: Oncology

## 2018-02-16 DIAGNOSIS — Z510 Encounter for antineoplastic radiation therapy: Secondary | ICD-10-CM | POA: Diagnosis not present

## 2018-02-16 MED ORDER — ENCORAFENIB 75 MG PO CAPS: 450.0000 mg | ORAL_CAPSULE | Freq: Every day | ORAL | 0 refills | Status: DC

## 2018-02-16 MED ORDER — BINIMETINIB 15 MG PO TABS: 45.0000 mg | ORAL_TABLET | Freq: Two times a day (BID) | ORAL | 0 refills | Status: DC

## 2018-02-16 NOTE — Progress Notes (Signed)
[place] [facility] Work  Clinical Social Work met with patient in [place] office today.  Kaitlyn Duncan shared she has experienced fatigue throughout radiation treatment, but is other wise doing well.  She recently had PCP appointment at [facility] and is engaging in case management services at [facility].  CSW provided patient with a 31 day bus pass through the [employer]- patient plans to utilize bus pass to run errands or get to community appointments.   [name], LCSW  Clinical Social Worker [facility]

## 2018-02-16 NOTE — Progress Notes (Signed)
BRAF testing on her melanoma resulted in a detected mutation.  This has been discussed with the patient prior visit and that she would be eligible to be treated with combination of Encorafenib and Binimetinib to be started in the immediate future.  I anticipate starting this therapy early next week after August 19.

## 2018-02-16 NOTE — Progress Notes (Signed)
[facility] Progress Note   Kaitlyn Duncan [id number] 02-17-1971 47 y.o.  Kaitlyn Duncan is managed by Dr. Alen Blew  Actively treated with chemotherapy/immunotherapy: no  Assessment: Plan:    Oral candidiasis - Plan: fluconazole (DIFLUCAN) 100 MG tablet  Malignant melanoma metastatic to brain [facility])  Oral candidiasis: The patient was given a prescription for Diflucan 100 mg p.o. once daily x7 days.  Metastatic melanoma with metastatic disease to the brain: The patient continues to be followed by Dr. Alen Blew.  Please see After Visit Summary for patient specific instructions.  Future Appointments  Date Time Provider [place]  02/16/2018  1:45 PM [facility] LINAC 1 [facility] None  02/17/2018  1:45 PM [facility] LINAC 1 [facility] None  02/20/2018 10:30 AM [facility] ONLY [facility] None  02/20/2018 11:00 AM Shadad, Mathis Dad, MD [facility] None  03/30/2018  9:30 AM Bruning, Ashlyn, PA-C [facility] None    No orders of the defined types were placed in this encounter.      Subjective:   Patient ID:  Kaitlyn Duncan is a 47 y.o. (DOB May 15, 1971) female.  Chief Complaint:  Chief Complaint  Patient presents with  . Abdominal Pain    HPI Kaitlyn Duncan is 47 year old woman with a diagnosis of a metastatic melanoma dating to July 2019 when she presented with subcutaneous nodules, peritoneal involvement, lung metastasis, GI track involvement as well as brain metastasis. She was treated with a total of 10 fractions of whole brain radiation which began on 02/07/2018. Kaitlyn Duncan was last seen by Dr. Alen Blew on 03/09/2018. She was having GI bleeding and had been found to have widespread disease including CNS metastasis, bulky adenopathy, peritoneal involvement as well as lung nodules.  Dr. Alen Blew reviewed the natural course of this disease with the patient and her family. He explained that her disease is very aggressive with widespread  involvement. He also explained that any treatment would be palliative with a very minimal chance of cure. Dr. Alen Blew reported that the patient's social situation was very challenging as she is currently homeless with no income and no local family.  Her father lives in [place].  Dr. Alen Blew outlined to the patient and her family the difficulties that she may be facing and because of her medical illness.  He stated that having family support is paramount if it is possible.  Her being treated close to home might be beneficial to her overall outcome.  Kaitlyn Duncan called yesterday stating, "I've been experiencing a lot of abdominal pain, gas and indigestion for a while. I've been taking Protonix twice a day. I also have right knee pain. Would this cause gas?"  She denied numbness and tingling to right leg and knee. She then stated, "I do have tingling and numbness in my left hand, usually in the morning." she reports having epigastric pain last week that went away after eating food.  She has had more gas and burping than normal.  She is taking Protonix twice daily.  She reports periodic right knee pain which occurs mainly in the mornings with movement.  She has to walk 1/4 to 1/2 mile to a bus stop to get a ride here to clinic.  She reports numbness and tingling in her left hand in the morning which occurs with movement.  She is having night sweats, sore throat, and dyspnea on exertion which she believes is secondary to radiation.  She denies nausea, vomiting, diarrhea, fevers, chills, or chest pain.  Medications: I have  reviewed the patient's current medications.  Allergies: No Known Allergies  Past Medical History:  Diagnosis Date  . Anemia   . Anemia   . Diabetes mellitus without complication (HCC)    prediabetic  . Seasonal allergies     Past Surgical History:  Procedure Laterality Date  . BIOPSY  01/28/2018   Procedure: BIOPSY;  Surgeon: Laurence Spates, MD;  Location: [facility];  Service:  Endoscopy;;  . ESOPHAGOGASTRODUODENOSCOPY N/A 01/28/2018   Procedure: ESOPHAGOGASTRODUODENOSCOPY (EGD);  Surgeon: Laurence Spates, MD;  Location: [facility];  Service: Endoscopy;  Laterality: N/A;  . MOUTH SURGERY    . OVARY SURGERY     removed "something"    Family History  Problem Relation Age of Onset  . Hypertension Father   . Breast cancer Maternal Aunt   . Diabetes Maternal Aunt   . Breast cancer Paternal Aunt   . Breast cancer Maternal Grandmother   . Breast cancer Paternal Grandmother   . Diabetes Paternal Grandmother     Social History   Socioeconomic History  . Marital status: Single    Spouse name: Not on file  . Number of children: Not on file  . Years of education: Not on file  . Highest education level: Not on file  Occupational History  . Not on file  Social Needs  . Financial resource strain: Not on file  . Food insecurity:    Worry: Not on file    Inability: Not on file  . Transportation needs:    Medical: Not on file    Non-medical: Not on file  Tobacco Use  . Smoking status: Never Smoker  . Smokeless tobacco: Never Used  Substance and Sexual Activity  . Alcohol use: No  . Drug use: No  . Sexual activity: Not Currently  Lifestyle  . Physical activity:    Days per week: Not on file    Minutes per session: Not on file  . Stress: Not on file  Relationships  . Social connections:    Talks on phone: Not on file    Gets together: Not on file    Attends religious service: Not on file    Active member of club or organization: Not on file    Attends meetings of clubs or organizations: Not on file    Relationship status: Not on file  . Intimate partner violence:    Fear of current or ex partner: Not on file    Emotionally abused: Not on file    Physically abused: Not on file    Forced sexual activity: Not on file  Other Topics Concern  . Not on file  Social History Narrative  . Not on file    Past Medical History, Surgical history, Social  history, and Family history were reviewed and updated as appropriate.   Please see review of systems for further details on the patient's review from today.   Review of Systems:  Review of Systems  Constitutional: Negative for activity change, appetite change, chills, diaphoresis and fever.  HENT: Negative for trouble swallowing.   Respiratory: Negative for cough, chest tightness and shortness of breath.   Cardiovascular: Negative for chest pain, palpitations and leg swelling.  Gastrointestinal: Positive for abdominal pain. Negative for abdominal distention, constipation, diarrhea, nausea and vomiting.       Burping  Musculoskeletal: Positive for arthralgias and gait problem (The patient is ambulating with a rolling walker.).  Neurological: Positive for numbness.    Objective:   Physical Exam:  BP  113/68 (BP Location: Right Arm, Patient Position: Sitting)   Pulse 79   Temp 99.2 F (37.3 C) (Oral)   Resp 18   Ht 5' (1.524 m)   Wt 156 lb 6.4 oz (70.9 kg)   LMP 01/15/2018 (Exact Date)   SpO2 99%   BMI 30.54 kg/m   ECOG: 1  Physical Exam  Constitutional: No distress.  The patient is an adult female who appears older than her stated age.  She appears to be in no acute distress.  The patient is ambulating with a rolling walker.  HENT:  Head: Normocephalic and atraumatic.  Right Ear: External ear normal.  Left Ear: External ear normal.  Mouth/Throat: No oropharyngeal exudate.  Whitish plaquing of the posterior pharynx is noted.  Neck: Normal range of motion. Neck supple.  Cardiovascular: Normal rate, regular rhythm and normal heart sounds. Exam reveals no gallop and no friction rub.  No murmur heard. Pulmonary/Chest: Effort normal and breath sounds normal. No respiratory distress. She has no wheezes. She has no rales.  Lymphadenopathy:    She has no cervical adenopathy.  Neurological: She is alert. Coordination normal.  Skin: Skin is warm and dry. No rash noted. She is not  diaphoretic. No erythema.  Psychiatric: Judgment and thought content normal.    Lab Review:     Component Value Date/Time   NA 137 01/27/2018 0650   K 4.1 01/27/2018 0650   CL 106 01/27/2018 0650   CO2 23 01/27/2018 0650   GLUCOSE 144 (H) 01/27/2018 0650   BUN 20 01/27/2018 0650   CREATININE 0.56 01/27/2018 0650   CALCIUM 8.7 (L) 01/27/2018 0650   PROT 7.1 01/26/2018 1227   ALBUMIN 3.8 01/26/2018 1227   AST 26 01/26/2018 1227   ALT 14 01/26/2018 1227   ALKPHOS 79 01/26/2018 1227   BILITOT 0.6 01/26/2018 1227   GFRNONAA >60 01/27/2018 0650   GFRAA >60 01/27/2018 0650       Component Value Date/Time   WBC 12.1 (H) 01/29/2018 0350   RBC 3.69 (L) 01/29/2018 0350   HGB 9.2 (L) 01/29/2018 0350   HCT 30.3 (L) 01/29/2018 0350   PLT 198 01/29/2018 0350   MCV 82.1 01/29/2018 0350   MCH 24.9 (L) 01/29/2018 0350   MCHC 30.4 01/29/2018 0350   RDW 14.8 01/29/2018 0350   LYMPHSABS 1.9 02/10/2010 2054   MONOABS 0.4 02/10/2010 2054   EOSABS 0.1 02/10/2010 2054   BASOSABS 0.0 02/10/2010 2054   -------------------------------  Imaging from last 24 hours (if applicable):  Radiology interpretation: Ct Head Wo Contrast  Result Date: 01/26/2018 CLINICAL DATA:  Nausea and vomiting for 1 week EXAM: CT HEAD WITHOUT CONTRAST TECHNIQUE: Contiguous axial images were obtained from the base of the skull through the vertex without intravenous contrast. COMPARISON:  None. FINDINGS: Brain: There is an ill-defined hyperdense focus in the medial right parietal lobe measuring approximately 1 cm. There is no surrounding vasogenic edema. There is no mass effect or midline shift. Ventricular system and extra-axial space are within normal limits. Vascular: No hyperdense vessel or unexpected calcification. Skull: The cranium is intact. Sinuses/Orbits: There is opacification of the left maxillary sinus with postoperative changes. Small mucous retention cyst in the right maxillary sinus. Nasal septum is deviated  to the left. Orbits are within normal limits. Other: Noncontributory IMPRESSION: There is a hyperdense focus in the right parietal lobe. Differential diagnosis includes a hyperdense mass, cavernous angioma, or subacute hemorrhage. Initially, CT head with contrast may be helpful. Ultimately, brain MRI  may be required. Electronically Signed   By: Marybelle Killings M.D.   On: 01/26/2018 14:10   Ct Chest W Contrast  Result Date: 01/26/2018 CLINICAL DATA:  Left mid abdominal pain, nausea and vomiting for the past week. Hematemesis yesterday. Intermittent right breast pain for the past 3 months. Stage IV melanoma. EXAM: CT CHEST, ABDOMEN, AND PELVIS WITH CONTRAST TECHNIQUE: Multidetector CT imaging of the chest, abdomen and pelvis was performed following the standard protocol during bolus administration of intravenous contrast. CONTRAST:  148mL OMNIPAQUE IOHEXOL 300 MG/ML  SOLN COMPARISON:  Abdomen radiographs obtained earlier today. Pelvic ultrasound dated 12/29/2009. FINDINGS: CT CHEST FINDINGS Cardiovascular: No significant vascular findings. Normal heart size. No pericardial effusion. Mediastinum/Nodes: Mildly enlarged bilateral axillary and retropectoral lymph nodes. These include a 9 mm short axis right axillary node on image number 14 series 3 and an 8 mm short axis retropectoral node on the left on image number 13 series 3. No enlarged hilar or mediastinal nodes. Thyroid gland, trachea, and esophagus demonstrate no significant findings. There is an incidentally noted 2 mm right lobe thyroid nodule. Lungs/Pleura: Large number of nodules throughout both lungs. The largest on the right is in the inferior aspect of the right upper lobe, measuring 1.5 x 1.0 cm on image number 63 series 5. The largest on the left is a left lower lobe nodule measuring 2.0 x 0.9 cm on image number 86 series 5. No pleural fluid. Musculoskeletal: Large number of small, rounded and oval, circumscribed subcutaneous nodules bilaterally. There  are similar lodges in both breasts. The largest is inferior to the right breast laterally, measuring 2.4 x 1.3 cm on image number 43 series 3. Mild thoracic spine degenerative changes. Lower cervical spine degenerative changes. No bony metastases. A T12 hemangioma is noted. CT ABDOMEN PELVIS FINDINGS Hepatobiliary: Multiple noncalcified gallstones in the gallbladder. These are difficult to separate from each other. The largest individual visualized gallstone measures 8 mm. No gallbladder wall thickening or pericholecystic fluid. Unremarkable liver. Pancreas: Unremarkable. No pancreatic ductal dilatation or surrounding inflammatory changes. Spleen: 1.1 cm oval enhancing mass in the central spleen an additional smaller enhancing masses in that region. Adrenals/Urinary Tract: 2 masses in each adrenal gland. These are heterogeneous. These include a 3.7 x 2.8 cm right adrenal mass and 3.4 x 2.7 cm left adrenal mass on image number 58 of series 3. There is an additional 3.5 x 1.9 cm right adrenal mass and 1.7 x 1.3 cm left adrenal mass on image number 54 series 3. Normal appearing kidneys, ureters and urinary bladder. Stomach/Bowel: Unremarkable stomach, small bowel and colon. No evidence of appendicitis. Vascular/Lymphatic: No significant vascular findings are present. No enlarged abdominal or pelvic lymph nodes. Reproductive: 4.4 x 3.0 cm left myometrial mass. 1.5 x 1.43 cm fundal myometrial mass containing a small calcification. 5.0 x 4.4 cm right ovarian cyst containing low to medium density fluid measuring 26 Hounsfield units in density. Unremarkable left ovary. Other: Large number of small, rounded and oval, circumscribed nodules scattered throughout the abdominal and pelvic peritoneal cavity and retroperitoneum. Large number of similar nodules in the subcutaneous fat bilaterally. These are most numerous in the perineal regions. Musculoskeletal: Right sacral bone island. L5-S1 degenerative changes. IMPRESSION: 1.  Large number of metastases in the subcutaneous fat of the chest, abdomen pelvis. 2. Large number of metastases scattered throughout the peritoneum and retroperitoneum of the abdomen and pelvis. 3. Large number of lung metastases. 4. Mild bilateral axillary metastatic adenopathy. 5. Bilateral adrenal metastases. 6. Cholelithiasis. 7.  Uterine fibroids. 8. Right ovarian probable endometrioma or hemorrhagic cyst, based on previous ultrasound findings. 9. 2 mm right lobe thyroid nodule. This is most likely an incidental benign nodule. A metastasis is less likely. Electronically Signed   By: Claudie Revering M.D.   On: 01/26/2018 20:41   Kaitlyn Duncan IE Contrast  Result Date: 01/28/2018 CLINICAL DATA:  SRS protocol for metastatic melanoma. EXAM: MRI HEAD WITHOUT AND WITH CONTRAST TECHNIQUE: Multiplanar, multiecho pulse sequences of the brain and surrounding structures were obtained without and with intravenous contrast. CONTRAST:  46mL MULTIHANCE GADOBENATE DIMEGLUMINE 529 MG/ML IV SOLN COMPARISON:  Brain MRI from 2 days ago FINDINGS: Brain: Over 50 T1 hyperintense brain lesions consistent with metastatic disease. These are marked on precontrast series so as not to confuse vessel with lesion. T1 hyperintensity is from melanin and/or blood products, with gradient hypointensity. There is no associated edema or mass effect. The largest is in the right occipital lobe at 14 mm. Additional notable lesions are left occipital lobe at 7 mm and medial right thalamus is 7 mm. No infarct, acute hemorrhage, hydrocephalus, or collection. Vascular: Major flow voids and vascular enhancements are preserved. Skull and upper cervical spine: No evidence of marrow lesion Sinuses/Orbits: Negative for mass Other: Innumerable small subcutaneous lesions, including a 24 mm ovoid lesion in the left intrinsic neck muscles. IMPRESSION: 1. Over 50 brain metastases, the largest measuring 14 mm in the right occipital lobe. No mass effect or vasogenic  edema. 2. Innumerable subcutaneous lesions. Electronically Signed   By: Monte Fantasia M.D.   On: 01/28/2018 21:18   Kaitlyn Duncan PP Contrast  Result Date: 01/26/2018 CLINICAL DATA:  Abnormal head CT. Nausea and vomiting and anemia. Headaches EXAM: MRI HEAD WITHOUT AND WITH CONTRAST TECHNIQUE: Multiplanar, multiecho pulse sequences of the brain and surrounding structures were obtained without and with intravenous contrast. CONTRAST:  41mL MULTIHANCE GADOBENATE DIMEGLUMINE 529 MG/ML IV SOLN COMPARISON:  CT head 01/26/2018 FINDINGS: Brain: Widespread small hemorrhagic lesions are present in the brain. Most these are very small and contain intrinsic T1 shortening due to methemoglobin or melanin. Many lesions also show enhancement suggesting underlying tumor. Largest areas of hemorrhage include the right occipital lobe measuring approximately 12 mm and right parietal lobe measuring 9 mm. Mild edema around the right parietal lesion which also shows enhancement. Approximately 17 lesions are identified in the brain. These are marked with arrows on the axial T1 post-contrast sequence. Ventricle size normal. No midline shift. No areas of acute infarction. No significant chronic ischemia. Vascular: Normal arterial flow voids Skull and upper cervical spine: No lesions in the calvarium identified. However, there are numerous small dermal lesions throughout the scalp and upper neck bilaterally. Many of these show enhancement and are suspicious for tumor deposits. Sinuses/Orbits: Mucosal edema throughout the paranasal sinuses. Normal orbit Other: None IMPRESSION: Numerous small hemorrhagic lesions throughout the brain many of which show enhancement. Largest lesion in the right parietal lobe shows mild surrounding edema. Findings are highly suspicious for hemorrhagic metastatic disease such as melanoma. In addition, there are numerous enhancing lesions throughout the scalp and skin in the upper neck also suspicious for  widespread tumor. These results were called by telephone at the time of interpretation on 01/26/2018 at 4:38 pm to Dr. Duffy Bruce , who verbally acknowledged these results. Electronically Signed   By: Franchot Gallo M.D.   On: 01/26/2018 16:39   Ct Abdomen Pelvis W Contrast  Result Date: 01/26/2018 CLINICAL DATA:  Left mid abdominal  pain, nausea and vomiting for the past week. Hematemesis yesterday. Intermittent right breast pain for the past 3 months. Stage IV melanoma. EXAM: CT CHEST, ABDOMEN, AND PELVIS WITH CONTRAST TECHNIQUE: Multidetector CT imaging of the chest, abdomen and pelvis was performed following the standard protocol during bolus administration of intravenous contrast. CONTRAST:  117mL OMNIPAQUE IOHEXOL 300 MG/ML  SOLN COMPARISON:  Abdomen radiographs obtained earlier today. Pelvic ultrasound dated 12/29/2009. FINDINGS: CT CHEST FINDINGS Cardiovascular: No significant vascular findings. Normal heart size. No pericardial effusion. Mediastinum/Nodes: Mildly enlarged bilateral axillary and retropectoral lymph nodes. These include a 9 mm short axis right axillary node on image number 14 series 3 and an 8 mm short axis retropectoral node on the left on image number 13 series 3. No enlarged hilar or mediastinal nodes. Thyroid gland, trachea, and esophagus demonstrate no significant findings. There is an incidentally noted 2 mm right lobe thyroid nodule. Lungs/Pleura: Large number of nodules throughout both lungs. The largest on the right is in the inferior aspect of the right upper lobe, measuring 1.5 x 1.0 cm on image number 63 series 5. The largest on the left is a left lower lobe nodule measuring 2.0 x 0.9 cm on image number 86 series 5. No pleural fluid. Musculoskeletal: Large number of small, rounded and oval, circumscribed subcutaneous nodules bilaterally. There are similar lodges in both breasts. The largest is inferior to the right breast laterally, measuring 2.4 x 1.3 cm on image number 43  series 3. Mild thoracic spine degenerative changes. Lower cervical spine degenerative changes. No bony metastases. A T12 hemangioma is noted. CT ABDOMEN PELVIS FINDINGS Hepatobiliary: Multiple noncalcified gallstones in the gallbladder. These are difficult to separate from each other. The largest individual visualized gallstone measures 8 mm. No gallbladder wall thickening or pericholecystic fluid. Unremarkable liver. Pancreas: Unremarkable. No pancreatic ductal dilatation or surrounding inflammatory changes. Spleen: 1.1 cm oval enhancing mass in the central spleen an additional smaller enhancing masses in that region. Adrenals/Urinary Tract: 2 masses in each adrenal gland. These are heterogeneous. These include a 3.7 x 2.8 cm right adrenal mass and 3.4 x 2.7 cm left adrenal mass on image number 58 of series 3. There is an additional 3.5 x 1.9 cm right adrenal mass and 1.7 x 1.3 cm left adrenal mass on image number 54 series 3. Normal appearing kidneys, ureters and urinary bladder. Stomach/Bowel: Unremarkable stomach, small bowel and colon. No evidence of appendicitis. Vascular/Lymphatic: No significant vascular findings are present. No enlarged abdominal or pelvic lymph nodes. Reproductive: 4.4 x 3.0 cm left myometrial mass. 1.5 x 1.43 cm fundal myometrial mass containing a small calcification. 5.0 x 4.4 cm right ovarian cyst containing low to medium density fluid measuring 26 Hounsfield units in density. Unremarkable left ovary. Other: Large number of small, rounded and oval, circumscribed nodules scattered throughout the abdominal and pelvic peritoneal cavity and retroperitoneum. Large number of similar nodules in the subcutaneous fat bilaterally. These are most numerous in the perineal regions. Musculoskeletal: Right sacral bone island. L5-S1 degenerative changes. IMPRESSION: 1. Large number of metastases in the subcutaneous fat of the chest, abdomen pelvis. 2. Large number of metastases scattered throughout the  peritoneum and retroperitoneum of the abdomen and pelvis. 3. Large number of lung metastases. 4. Mild bilateral axillary metastatic adenopathy. 5. Bilateral adrenal metastases. 6. Cholelithiasis. 7. Uterine fibroids. 8. Right ovarian probable endometrioma or hemorrhagic cyst, based on previous ultrasound findings. 9. 2 mm right lobe thyroid nodule. This is most likely an incidental benign nodule. A metastasis is  less likely. Electronically Signed   By: Claudie Revering M.D.   On: 01/26/2018 20:41   Dg Abd 2 Views  Result Date: 01/26/2018 CLINICAL DATA:  Nausea and vomiting. EXAM: ABDOMEN - 2 VIEW COMPARISON:  None. FINDINGS: The bowel gas pattern is normal. There is no evidence of free air. No radio-opaque calculi or other significant radiographic abnormality is seen. Probable bone island in the right sacral ala. No acute osseous abnormality. IMPRESSION: Negative. Electronically Signed   By: Kaitlyn Duncan M.D.   On: 01/26/2018 14:01   Ms Digital Screening Tomo Bilateral  Result Date: 01/27/2018 CLINICAL DATA:  Screening. 47 year old female with known metastatic melanoma. Patient had a CT scan of the chest, abdomen and pelvis on 01/26/2018 which showed a large number of subcutaneous metastasis in the fat of the chest, abdomen and pelvis. EXAM: DIGITAL SCREENING BILATERAL MAMMOGRAM WITH TOMO AND CAD COMPARISON:  Previous exam(s). ACR Breast Density Category c: The breast tissue is heterogeneously dense, which may obscure small masses. FINDINGS: There are no findings suspicious for breast malignancy. Innumerable rounded masses are seen bilaterally consistent with subcutaneous metastatic melanoma. Images were processed with CAD. IMPRESSION: Innumerable subcutaneous nodules in both breast consistent with metastatic melanoma. No mammographic evidence of breast malignancy. A result letter of this screening mammogram will be mailed directly to the patient. RECOMMENDATION: Screening mammogram in one year.  (Code:SM-B-01Y) BI-RADS CATEGORY  1: Negative. Electronically Signed   By: Lillia Mountain M.D.   On: 01/27/2018 13:43   [place] Core Biopsy (soft Tissue)  Result Date: 01/27/2018 INDICATION: Melanoma, cutaneous metastases EXAM: Ultrasound right chest wall subcutaneous mass core biopsy MEDICATIONS: 1% lidocaine local ANESTHESIA/SEDATION: Moderate (conscious) sedation was employed during this procedure. A total of Versed 2.0 mg and Fentanyl 50 mcg was administered intravenously. Moderate Sedation Time: 10 minutes. The patient's level of consciousness and vital signs were monitored continuously by radiology nursing throughout the procedure under my direct supervision. FLUOROSCOPY TIME:  Fluoroscopy Time: None. COMPLICATIONS: None immediate. PROCEDURE: Informed written consent was obtained from the patient after a thorough discussion of the procedural risks, benefits and alternatives. All questions were addressed. Maximal Sterile Barrier Technique was utilized including caps, mask, sterile gowns, sterile gloves, sterile drape, hand hygiene and skin antiseptic. A timeout was performed prior to the initiation of the procedure. Previous imaging reviewed. Preliminary ultrasound performed. The right lateral chest 2 cm subcutaneous nodule by chest CT was localized with ultrasound just inferior to the right breast. Overlying skin marked. Under sterile conditions and local anesthesia, an 18 gauge core biopsy was advanced to the lesion. Needle position confirmed with ultrasound. 18 gauge core biopsies obtained (4). Samples placed in formalin. Needle removed. Postprocedure imaging demonstrates no hemorrhage or hematoma. Patient tolerated the biopsy well. IMPRESSION: Successful ultrasound right lateral chest wall subcutaneous mass core biopsies Electronically Signed   By: Jerilynn Mages.  Shick M.D.   On: 01/27/2018 12:06

## 2018-02-17 ENCOUNTER — Other Ambulatory Visit: Payer: Self-pay | Admitting: *Deleted

## 2018-02-17 ENCOUNTER — Telehealth: Payer: Self-pay | Admitting: *Deleted

## 2018-02-17 ENCOUNTER — Encounter: Payer: Self-pay | Admitting: Radiation Oncology

## 2018-02-17 ENCOUNTER — Encounter: Payer: Self-pay | Admitting: *Deleted

## 2018-02-17 ENCOUNTER — Ambulatory Visit
Admission: RE | Admit: 2018-02-17 | Discharge: 2018-02-17 | Disposition: A | Discharge status: Home / Self Care | Payer: Medicaid Other | Source: Ambulatory Visit | Attending: Radiation Oncology | Admitting: Radiation Oncology

## 2018-02-17 ENCOUNTER — Other Ambulatory Visit: Payer: Self-pay | Admitting: Urology

## 2018-02-17 DIAGNOSIS — Z510 Encounter for antineoplastic radiation therapy: Secondary | ICD-10-CM | POA: Diagnosis not present

## 2018-02-17 MED ORDER — OXYBUTYNIN CHLORIDE 5 MG PO TABS: 5.0000 mg | ORAL_TABLET | Freq: Three times a day (TID) | ORAL | 5 refills | Status: DC

## 2018-02-17 MED ORDER — DEXAMETHASONE 4 MG PO TABS: 4.0000 mg | ORAL_TABLET | Freq: Two times a day (BID) | ORAL | 0 refills | Status: DC

## 2018-02-17 NOTE — Telephone Encounter (Signed)
Patient calling to request a refill on her oxybutynin. Okay to refill?

## 2018-02-17 NOTE — Telephone Encounter (Signed)
LM on patient's AM, to let me know which pharmacy she would like her oxybutynin called to?

## 2018-02-17 NOTE — Telephone Encounter (Signed)
Patient calling for refill on oxybutynin. Refilled and  sent to [facility] long outpatient pharmacy. Unable to reach patient. Get VM and unable to leave message, mailbox not set up.

## 2018-02-17 NOTE — Telephone Encounter (Signed)
Yes

## 2018-02-17 NOTE — Progress Notes (Signed)
  Radiation Oncology         [phone]) [id number] ________________________________  Name: Kaitlyn Duncan MRN: [id number]  Date: 02/17/2018  DOB: 1971-05-26  End of Treatment Note  Diagnosis:   47 yo woman with over 24 brain metastases from stage IV melanoma  Indication for treatment:  Palliation       Radiation treatment dates:   02/06/2018 to 02/17/2018  Site/dose:   The whole brain was treated to 30 Gy in 10 fractions of 3 Gy  Beams/energy:   Right and Left radiation fields were treated using 6 MV X-rays with custom MLC collimation to shield the eyes and face.  The patient was immobilized with a thermoplastic mask and isocenter was verified with weekly port films.  Narrative: The patient tolerated radiation treatment relatively well. Denies pain. Reports taking Decadron 4 mg tid. No evidence of thrush noted. Patient prescribed Diflucan earlier in the week by Sandi Mealy. Denies headaches. Alopecia has begun. Reports dry mouth. Reports some dizziness and unsteady gait. Denies nausea or vomiting. Reports excessive gas and belching despite taking protonix bid. Denies tinnitus or diplopia. Reports moderate to severe fatigue. Ambulates with aid of walker.   Plan: The patient has completed radiation treatment. Instructed on steroid taper today. The patient will return to radiation oncology clinic for routine followup in one month. I advised them to call or return sooner if they have any questions or concerns related to their recovery or treatment. ________________________________  Sheral Apley. Tammi Klippel, M.D.  This document serves as a record of services personally performed by Tyler Pita, MD. It was created on his behalf by Arlyce Harman, a trained medical scribe. The creation of this record is based on the scribe's personal observations and the provider's statements to them. This document has been checked and approved by the attending provider.

## 2018-02-19 ENCOUNTER — Ambulatory Visit: Payer: Medicaid Other

## 2018-02-20 ENCOUNTER — Telehealth: Payer: Self-pay | Admitting: Oncology

## 2018-02-20 ENCOUNTER — Telehealth: Payer: Self-pay

## 2018-02-20 ENCOUNTER — Inpatient Hospital Stay (HOSPITAL_BASED_OUTPATIENT_CLINIC_OR_DEPARTMENT_OTHER): Payer: Medicaid Other | Admitting: Oncology

## 2018-02-20 ENCOUNTER — Inpatient Hospital Stay: Payer: Medicaid Other

## 2018-02-20 ENCOUNTER — Telehealth: Payer: Self-pay | Admitting: Pharmacist

## 2018-02-20 VITALS — BP 117/69 | HR 93 | Temp 98.4°F | Resp 18 | Ht 60.0 in | Wt 154.9 lb

## 2018-02-20 DIAGNOSIS — C7931 Secondary malignant neoplasm of brain: Secondary | ICD-10-CM

## 2018-02-20 DIAGNOSIS — C439 Malignant melanoma of skin, unspecified: Secondary | ICD-10-CM | POA: Diagnosis not present

## 2018-02-20 DIAGNOSIS — C786 Secondary malignant neoplasm of retroperitoneum and peritoneum: Secondary | ICD-10-CM | POA: Diagnosis not present

## 2018-02-20 DIAGNOSIS — C7800 Secondary malignant neoplasm of unspecified lung: Secondary | ICD-10-CM

## 2018-02-20 DIAGNOSIS — Z590 Homelessness: Secondary | ICD-10-CM

## 2018-02-20 DIAGNOSIS — C7989 Secondary malignant neoplasm of other specified sites: Secondary | ICD-10-CM

## 2018-02-20 DIAGNOSIS — Z923 Personal history of irradiation: Secondary | ICD-10-CM

## 2018-02-20 LAB — CBC WITH DIFFERENTIAL (CANCER CENTER ONLY)
BASOS ABS: 0 10*3/uL (ref 0.0–0.1)
BASOS PCT: 0 %
EOS PCT: 0 %
Eosinophils Absolute: 0 10*3/uL (ref 0.0–0.5)
HEMATOCRIT: 33.1 % — AB (ref 34.8–46.6)
Hemoglobin: 10.1 g/dL — ABNORMAL LOW (ref 11.6–15.9)
Lymphocytes Relative: 4 %
Lymphs Abs: 0.5 10*3/uL — ABNORMAL LOW (ref 0.9–3.3)
MCH: 24.2 pg — ABNORMAL LOW (ref 25.1–34.0)
MCHC: 30.5 g/dL — AB (ref 31.5–36.0)
MCV: 79.4 fL — ABNORMAL LOW (ref 79.5–101.0)
MONOS PCT: 8 %
Monocytes Absolute: 1 10*3/uL — ABNORMAL HIGH (ref 0.1–0.9)
Neutro Abs: 10.5 10*3/uL — ABNORMAL HIGH (ref 1.5–6.5)
Neutrophils Relative %: 88 %
PLATELETS: 105 10*3/uL — AB (ref 145–400)
RBC: 4.17 MIL/uL (ref 3.70–5.45)
RDW: 15.8 % — ABNORMAL HIGH (ref 11.2–14.5)
WBC Count: 12 10*3/uL — ABNORMAL HIGH (ref 3.9–10.3)

## 2018-02-20 LAB — CMP (CANCER CENTER ONLY)
ALBUMIN: 3.6 g/dL (ref 3.5–5.0)
ALK PHOS: 59 U/L (ref 38–126)
ALT: 20 U/L (ref 0–44)
AST: 14 U/L — ABNORMAL LOW (ref 15–41)
Anion gap: 9 (ref 5–15)
BILIRUBIN TOTAL: 0.4 mg/dL (ref 0.3–1.2)
BUN: 33 mg/dL — ABNORMAL HIGH (ref 6–20)
CALCIUM: 8.6 mg/dL — AB (ref 8.9–10.3)
CO2: 23 mmol/L (ref 22–32)
CREATININE: 0.85 mg/dL (ref 0.44–1.00)
Chloride: 102 mmol/L (ref 98–111)
GFR, Est AFR Am: >60 mL/min (ref >60)
GFR, Estimated: >60 mL/min (ref >60)
GLUCOSE: 219 mg/dL — AB (ref 70–99)
Potassium: 4.3 mmol/L (ref 3.5–5.1)
SODIUM: 134 mmol/L — AB (ref 135–145)
TOTAL PROTEIN: 6.4 g/dL — AB (ref 6.5–8.1)

## 2018-02-20 MED ORDER — ONDANSETRON HCL 8 MG PO TABS: 8.0000 mg | ORAL_TABLET | Freq: Three times a day (TID) | ORAL | 0 refills | Status: DC | PRN

## 2018-02-20 NOTE — Telephone Encounter (Signed)
Oral Oncology Patient Advocate Encounter  Patient is uninsured. I filled out an application with [employer] for [name] and Mektovi. While the patient was in the office today I spoke with her about the process, what [employer] needs from [place] and had her sign the application.  I placed the application in Dr. Danise Mina folder for him to sign, choose refills and sign the no income letter.  Will update as process continues  [place] Patient [place] Phone [phone] Fax [phone]

## 2018-02-20 NOTE — Progress Notes (Signed)
Hematology and Oncology Follow Up Visit  Kaitlyn Duncan [id number] 1971/06/20 47 y.o. 02/20/2018 11:20 AM Kaitlyn Duncan, NPPlacey, Kaitlyn Muscat, NP   Principle Diagnosis: 47 year old woman with stage IV melanoma diagnosed in July 2019 that is being BRAF positive.  She presented with subcutaneous nodules, peritoneal involvement, lung metastasis, CNS metastasis   Prior Therapy: He is status post subcutaneous nodule biopsy on 01/27/2018 which confirmed the presence of metastatic melanoma. She status post endoscopy on 01/28/2018 which showed subcutaneous involvement that consistent with metastatic melanoma.  Whole brain radiation for total of 30 Gy in 10 fractions started on 02/07/2018.  Current therapy: Under consideration to start systemic therapy with Mektovi 45 mg bid, [name] 450 mg daily.     Interim History: Kaitlyn Duncan is here for a follow-up.  Since her last visit, she completed radiation therapy to the brain without any major toxicities.  She does report some fatigue, tiredness and occasional unsteadiness for which she uses a walker at times.  She has also reported increased right knee pain that is manageable.  Appetite remained reasonable but does report some dyspepsia associated with dexamethasone.  She denies any seizure activities, alteration in mental status or dizziness.  She does not report any headaches, blurry vision.  She denies any excessive fatigue or tiredness.  Does not report any fevers, chills or sweats.  Does not report any cough, wheezing or hemoptysis.  Does not report any chest pain, palpitation, orthopnea or leg edema.  Does not report any nausea, vomiting or abdominal pain.  Does not report any constipation or diarrhea.  She denies any hematochezia or melena. Does not report any paralysis or myalgias.   Does not report frequency, urgency or hematuria.  Does not report any skin rashes or lesions. Does not report any heat or cold intolerance.  Does not report any  lymphadenopathy or petechiae.  Does not report any mood changes.  Remaining review of systems is negative.    Medications: I have reviewed the patient's current medications.  Current Outpatient Medications  Medication Sig Dispense Refill  . acetaminophen (TYLENOL) 325 MG tablet Take 650 mg by mouth every 6 (six) hours as needed.    . Binimetinib (MEKTOVI) 15 MG TABS Take 45 mg by mouth 2 (two) times daily. 180 tablet 0  . dexamethasone (DECADRON) 4 MG tablet Take 1 tablet (4 mg total) by mouth 2 (two) times daily. And follow taper instructions provided at recent office visit 60 tablet 0  . Encorafenib ([name]) 75 MG CAPS Take 450 mg by mouth daily. 180 capsule 0  . levETIRAcetam (KEPPRA) 1000 MG tablet Take 1 tablet (1,000 mg total) by mouth 2 (two) times daily. 60 tablet 2  . loratadine (CLARITIN) 10 MG tablet Take 10 mg by mouth daily.    . Multiple Vitamin (MULTIVITAMIN WITH MINERALS) TABS tablet Take 1 tablet by mouth daily.    Marland Kitchen oxybutynin (DITROPAN) 5 MG tablet Take 1 tablet (5 mg total) by mouth 3 (three) times daily. 90 tablet 5  . pantoprazole (PROTONIX) 40 MG tablet Take 1 tablet (40 mg total) by mouth 2 (two) times daily. 60 tablet 0   No current facility-administered medications for this visit.      Allergies: No Known Allergies  Past Medical History, Surgical history, Social history, and Family History were reviewed and updated.  Review of Systems:    Physical Exam: Blood pressure 117/69, pulse 93, temperature 98.4 F (36.9 C), temperature source Oral, resp. rate 18, height 5' (1.524 m),  weight 154 lb 14.4 oz (70.3 kg), SpO2 100 %.   ECOG: 1 General appearance: Comfortable appearing woman without distress. Head: Atraumatic without abnormalities. Oropharynx: Sclera anicteric. Eyes: Conjunctiva is clear without icterus. Lymph nodes: No lymphadenopathy palpated in the cervical, supraclavicular regions with axillary lymphadenopathy bilaterally. Heart:regular rate and  rhythm.  No murmurs or gallops with no leg edema. Lung:chest clear, without any rhonchi, wheezes or dullness to percussion. Abdomin: soft, without any rebound or guarding.  No shifting dullness or ascites. Neurological: Intact motor, sensory and deep tendon reflexes. Skin: Subcutaneous nodules noted in her hairline has decreased in size. Musculoskeletal: No clubbing or cyanosis. Psychiatric: Her mood and affect remains appropriate and positive.    Lab Results: Lab Results  Component Value Date   WBC 12.0 (H) 02/20/2018   HGB 10.1 (L) 02/20/2018   HCT 33.1 (L) 02/20/2018   MCV 79.4 (L) 02/20/2018   PLT 105 (L) 02/20/2018     Chemistry      Component Value Date/Time   NA 134 (L) 02/20/2018 1027   K 4.3 02/20/2018 1027   CL 102 02/20/2018 1027   CO2 23 02/20/2018 1027   BUN 33 (H) 02/20/2018 1027   CREATININE 0.85 02/20/2018 1027      Component Value Date/Time   CALCIUM 8.6 (L) 02/20/2018 1027   ALKPHOS 59 02/20/2018 1027   AST 14 (L) 02/20/2018 1027   ALT 20 02/20/2018 1027   BILITOT 0.4 02/20/2018 1027        Impression and Plan:   47 year old woman with:  1.  Metastatic melanoma with disease to the brain, lymphadenopathy, multiple areas diagnosed in July 2019.  Her tumor is being BRAF positive.    The natural course of this disease was reviewed today and treatment options were reviewed. BRAF targeted therapy versus combined immunotherapy was debated and risks and benefits of both approaches were reviewed.  Given her high volume disease and visceral crisis that can occur at any time, I have recommended proceeding with Mektovi 45 mg bid, [name] 450 mg daily.   Complications associated with this medication were reviewed today.  Exclude nausea, fatigue, elevated CPK, fevers among others.  We will obtain a baseline cardiac function with an echocardiogram and continue to monitor periodically.  After taking this medication for a few months we will review her CT scan at  that time and consider switching her to immunotherapy depending on her of response.  2.  CNS metastasis: She completed 10 fractions of whole brain radiation without any delayed complications.  She is currently on tapering doses of dexamethasone.  3.  Prognosis: Her disease is incurable but her performance status is excellent and aggressive therapy is warranted.  4.  Psychosocial consideration: She continues to have a lot of challenges including homelessness as well as challenges in dealing with stage IV melanoma.  She continues to follow with her social worker regarding her ongoing needs.  5.  GI bleed: Her hemoglobin is stable without any further GI bleed at this time.  6.  Follow-up: We will be in 3 weeks to follow her progress.  30  minutes was spent with the patient face-to-face today.  More than 50% of time was dedicated to discussing the natural course of her disease, treatment options as well as complications related to her treatment.   Zola Button, MD 8/19/201911:20 AM

## 2018-02-20 NOTE — Telephone Encounter (Signed)
Oral Oncology Pharmacist Encounter  Received new prescription for [name] (encorafenib) and Mektovi (binimetinib) for the treatment of metastatic melanoma, BRAF V600E mutation positive, planned duration until disease progression or unacceptable toxicity.  Labs from Epic assessed, no renal or hepatic dysfunction noted. No baseline assessment of CPK due to risk of rhabdomyolysis, will be monitored while on therapy   No baseline ECHO in EMR Manufacturer recommends assessment of LVEF at baseline, one month after initiation, and every 2-3 month while on therapy due to potential for cardiac toxicity  No baseline assessment of QTc in EMR QTc prolongation has been seen with [name] therapy, electrolytes will be closely monitored, EKG will be performed if signs or symptoms of cardiotoxicity are present after intitiating treatment  Current medication list in Epic reviewed, no DDIs with [name] or Mektovi identified.  Noted patient without prescription insurance coverage. Oral oncology clinic will work with patient to try to obtain [name] and [facility] through manufacturer compassionate use program.  Oral Oncology Clinic will continue to follow for initial counseling and start date.  Johny Drilling, PharmD, BCPS, BCOP  02/20/2018 10:34 AM Oral Oncology Clinic [phone]

## 2018-02-20 NOTE — Telephone Encounter (Signed)
Oral Chemotherapy Pharmacist Encounter   I spoke with patient in exam room for overview of: [name] and Mektovi.   Counseled patient on administration, dosing, side effects, monitoring, drug-food interactions, safe handling, storage, and disposal.  Patient will take [name] 75 mg capsules, 6 capsules (450 mg) by mouth once daily, without regard to food.  Patient will take Mektovi 15 mg tablets, 3 tablets (45 mg) by mouth 2 times daily, without regard to food, approximately 12 hours apart.  Patient expressed extreme dissatisfaction with pill burden associated with new treatment regimen.  Patient knows to avoid grapefruit and grapefruit juice while on therapy with [name] and Mektovi. Patient states she has really been craving grapefruit, she will obtain grapefruit this week and knows that she should avoid this food once starting on therapy with [name] and Mektovi.  [name] and Mektovi start date: TBD, pending medication acquisition  Adverse effects of [name] include but are not limited to: Fatigue, nausea, vomiting, abdominal pain, arthralgia, headache, dizziness, hyperkeratosis or HFS, uveitis (eye pain, photophobia, vision changes), hemorrhage, and QTc prolongation.  Adverse effects of Mektovi include but are not limited to: Fatigue, nausea, vomiting, diarrhea, increased creatinine, increased LFTs, fever, rash, vision changes, peripheral edema, cardiac dysfunction, and hemorrhage  Baseline ECHO has been scheduled for 02/22/2018  Prescription for antinausea medication has been e-scribed to the [facility] and patient will use it if symptoms develop.  Reviewed with patient importance of keeping a medication schedule and plan for any missed doses.  Patient understands that [name] and Irean Hong are used in combination.  [name] voiced understanding and appreciation.   All questions answered. Medication reconciliation performed and medication/allergy list  updated.  Patient without insurance coverage. Medicaid application is still pending.  Patient is also homeless and has been for the past 3 years. She is currently staying at temporary housing through the end of this week, arrangements for continued housing are still in process.  Oral oncology patient advocate is working with patient to complete manufacturer assistance application in an effort to obtain [name] and Mektovi at $0 out-of-pocket cost to patient. We may have to coordinate medications being shipped to office and then arrange for pickup with patient until more permanent housing situation is in place. We will continue to stay in contact with clinical social worker who has been assisting patient with social needs.  Patient knows to call the office with questions or concerns. Oral Oncology Clinic will continue to follow.  Thank you,  Johny Drilling, PharmD, BCPS, BCOP  02/20/2018  11:59 AM Oral Oncology Clinic [phone]

## 2018-02-20 NOTE — Telephone Encounter (Signed)
Appts scheduled AVS/Calendar printed per 8/19 los °

## 2018-02-22 ENCOUNTER — Ambulatory Visit (HOSPITAL_COMMUNITY)
Admission: RE | Admit: 2018-02-22 | Discharge: 2018-02-22 | Disposition: A | Discharge status: Home / Self Care | Payer: Medicaid Other | Source: Ambulatory Visit | Attending: Oncology | Admitting: Oncology

## 2018-02-22 ENCOUNTER — Other Ambulatory Visit: Payer: Self-pay | Admitting: Oncology

## 2018-02-22 DIAGNOSIS — I313 Pericardial effusion (noninflammatory): Secondary | ICD-10-CM | POA: Diagnosis not present

## 2018-02-22 DIAGNOSIS — C7931 Secondary malignant neoplasm of brain: Secondary | ICD-10-CM

## 2018-02-22 DIAGNOSIS — I517 Cardiomegaly: Secondary | ICD-10-CM | POA: Insufficient documentation

## 2018-02-22 NOTE — Progress Notes (Signed)
  Echocardiogram 2D Echocardiogram has been performed.  Jannett Celestine 02/22/2018, 4:02 PM

## 2018-02-23 ENCOUNTER — Telehealth: Payer: Self-pay | Admitting: Oncology

## 2018-02-23 ENCOUNTER — Encounter: Payer: Self-pay | Admitting: *Deleted

## 2018-02-23 NOTE — Telephone Encounter (Signed)
Oral Oncology Patient Advocate Encounter  Manufacturer assistance with [employer] has been approved 02/21/18-02/22/19. Rolette will contact me to order refills and have them shipped to the office. I am currently waiting on a call from [employer] to schedule the first shipment.  I called the patient to make her aware of this and assured her I would call her to let her know when the first shipment is scheduled.  Patient verbalized understanding and great appreciation.  [place] Patient [place] Phone [phone] Fax [phone]

## 2018-02-23 NOTE — Progress Notes (Signed)
[place] Work  Patient has been temporarily residing at [employer] (housing managed by spiritual care department intended for caregivers of patients who are hospitalized in an acute setting) for three weeks.  Patient is aware this is not a permanent option, as it is not intended to be provided for patients.  CSW contacted patient to inform her she has been accepted into [employer].  CSW explained the program provides free housing at [facility] for 28 days and she would be assigned a Scientist, research (physical sciences) and [place] worker to provide case management services in the community.  Patient stated she does not desire to stay at [facility] due to a past stay that had bug infestation.  CSW voiced understanding, explained this program is only resource she's aware of at time.  Patient agreed to contact CSW of her decision as soon as possible.  1400: CSW unable to reach patient- attempted phone call three times, patient voicemail box is full.  CSW discussed case with Jeanella Craze, spiritual care director.  Mr. Shawn Stall contacted patient by phone and provided notice she would need to [facility] by tomorrow at noon.    1432: CSW received voicemail from patient.  CSW attempted to return voicemail, unable to reach patient.   [name], LCSW  Clinical Social Worker [facility]

## 2018-02-23 NOTE — Progress Notes (Signed)
[place] Work  Clinical Social Work contacted patient regarding her living situation.  Patient reported she has chosen not to enroll in [employer] project due to bug infestation at [facility].  Patient expressed frustrations that she has not been "advocated for" enough and wishes to stay at [facility].  CSW listened to patient's concerns, reiterated role of [employer] for caregivers only, and reviewed limited housing resources.    Patient plans to find her own housing for the foreseeable future.  [name], LCSW  Clinical Social Worker [facility]

## 2018-02-23 NOTE — Telephone Encounter (Signed)
Per staff message from [place], patient is uninsured and I notified ECHO of this also.

## 2018-02-24 NOTE — Telephone Encounter (Signed)
Oral Oncology Patient Advocate Encounter  [employer] called and scheduled a delivery date of Tuesday 8/27 for [name] and Mektovi.   I called the patient and was unable to leave a voicemail.  [place] Patient [place] Phone [phone] Fax [phone]

## 2018-02-28 MED FILL — DEXAMETHASONE 4 MG TABLET: 4 | 30 days supply | Qty: 60 | Fill #0

## 2018-02-28 NOTE — Telephone Encounter (Signed)
Oral Oncology Patient Advocate Encounter  Called 8/26 11:27am            8/26 3:20pm            8/27 10:20am  Still not able to leave a voicemail  [place] Patient [place] Phone [phone] Fax [phone]

## 2018-02-28 NOTE — Telephone Encounter (Signed)
Oral Oncology Patient Advocate Encounter  Patient called back earlier today. I informed her that the medicine should be here at the office today and when it arrived someone would call to let her know to come pick it up.  I have since received the medication and called her at 3:58pm to let her know so she can come pick it up and there was no answer.  Will keep trying to get her on the phone.  [name] Patient [place] Phone [phone] Fax [phone]

## 2018-03-01 ENCOUNTER — Telehealth: Payer: Self-pay | Admitting: *Deleted

## 2018-03-01 MED FILL — ONDANSETRON HCL 8 MG TABLET: 8 | 6 days supply | Qty: 20 | Fill #0

## 2018-03-01 NOTE — Telephone Encounter (Signed)
Oral Oncology Patient Advocate Encounter  I have called twice this morning with no answer. Voicemail is still full.  [place] Patient [place] Phone [phone] Fax [phone]

## 2018-03-01 NOTE — Telephone Encounter (Signed)
rec'd call from patient. She would like a call back , she has questions about her medications. Returned call and got VM. Mailbox was full and unable to leave message.

## 2018-03-01 NOTE — Telephone Encounter (Signed)
Oral Oncology Patient Advocate Encounter  I was able to get the patient on the phone and she is going to try to get here to get the medicine before 4:30 today.  [place] Patient [place] Phone [phone] Fax [phone]

## 2018-03-02 ENCOUNTER — Encounter: Payer: Self-pay | Admitting: *Deleted

## 2018-03-02 ENCOUNTER — Telehealth: Payer: Self-pay | Admitting: Pharmacist

## 2018-03-02 NOTE — Telephone Encounter (Signed)
Oral Oncology Patient Advocate Encounter  Ms. Mellette stopped me in the lobby on my way out yesterday evening. I went back to my office and brought her medicine down. I had her sign saying that she picked it up from me and that will be in her file.   She did ask when she should start taking it, how far apart she should take them and whether she needed to eat with it or not.  I advised her to call me this morning and I would ask the pharmacist that is helping me today if she could speak to her. Denyse Amass is on vacation.  [place] Patient [place] Phone [phone] Fax [phone]

## 2018-03-02 NOTE — Telephone Encounter (Signed)
Oral Chemotherapy Pharmacist Encounter   Patient met with patient advocate Margy Clarks today to pick up her Mektovi/Broftovi. Ms. Carolan told Daleen Snook she had a few questions about getting started.   I spoke with Ms. Domingo Cocking and reviewed the dosing, administration, and side effects of Mektovi/[name]. I answered all of her questions, she stated her understanding and felt comfortable getting started today. She knows to call with any concerns.  Darl Pikes, PharmD, BCPS, [facility] Hematology/Oncology Clinical Pharmacist [facility] [place] [facility] [phone]  03/02/2018 1:39 PM

## 2018-03-03 MED FILL — OXYBUTYNIN CHLORIDE 5 MG TA: 5 | 30 days supply | Qty: 90 | Fill #0

## 2018-03-07 ENCOUNTER — Telehealth: Payer: Self-pay

## 2018-03-07 ENCOUNTER — Telehealth: Payer: Self-pay | Admitting: Oncology

## 2018-03-07 MED FILL — levETIRAcetam 1000 MG TABS: 1000 | 30 days supply | Qty: 60 | Fill #1

## 2018-03-07 NOTE — Telephone Encounter (Signed)
Communicated to scheduling that patient will need symptom management appointment. [facility] aware. Unable to leave return message for patient as VM full.

## 2018-03-07 NOTE — Telephone Encounter (Signed)
Called patient regarding 9/3 sch msg

## 2018-03-07 NOTE — Telephone Encounter (Signed)
-----   Message from Wyatt Portela, MD sent at 03/07/2018 12:30 PM EDT ----- Regarding: RE: pATIENT CONCERNS Ok to be seen at symptom management.  Thanks.  ----- Message ----- From: Scot Dock, RN Sent: 03/07/2018  12:14 PM EDT To: Wyatt Portela, MD Subject: pATIENT CONCERNS                               Patient stating that she has been having a sore throat for the past week and the last time she felt this was was seen in symptom management clinic ([place] like 8/14). She c/o dry mouth and sores on her right nostril inside and out with some dried blood and sores on her scalp since starting the [name] and Mektovi last Thursday. She has an appointment with you on 9/9 but is asking to be seen in symptom management this week. Are you okay with that? Please advise. Thanks

## 2018-03-08 ENCOUNTER — Inpatient Hospital Stay: Payer: Medicaid Other | Attending: Obstetrics and Gynecology | Admitting: Medical

## 2018-03-08 ENCOUNTER — Inpatient Hospital Stay: Payer: Medicaid Other

## 2018-03-08 VITALS — BP 114/68 | HR 97 | Temp 98.8°F | Resp 19 | Ht 60.0 in | Wt 152.6 lb

## 2018-03-08 DIAGNOSIS — E119 Type 2 diabetes mellitus without complications: Secondary | ICD-10-CM | POA: Insufficient documentation

## 2018-03-08 DIAGNOSIS — C7931 Secondary malignant neoplasm of brain: Secondary | ICD-10-CM | POA: Diagnosis not present

## 2018-03-08 DIAGNOSIS — Z79899 Other long term (current) drug therapy: Secondary | ICD-10-CM | POA: Insufficient documentation

## 2018-03-08 DIAGNOSIS — K219 Gastro-esophageal reflux disease without esophagitis: Secondary | ICD-10-CM | POA: Diagnosis not present

## 2018-03-08 DIAGNOSIS — R040 Epistaxis: Secondary | ICD-10-CM

## 2018-03-08 DIAGNOSIS — C439 Malignant melanoma of skin, unspecified: Secondary | ICD-10-CM | POA: Insufficient documentation

## 2018-03-08 DIAGNOSIS — C7800 Secondary malignant neoplasm of unspecified lung: Secondary | ICD-10-CM

## 2018-03-08 DIAGNOSIS — J029 Acute pharyngitis, unspecified: Secondary | ICD-10-CM | POA: Insufficient documentation

## 2018-03-08 DIAGNOSIS — C786 Secondary malignant neoplasm of retroperitoneum and peritoneum: Secondary | ICD-10-CM | POA: Insufficient documentation

## 2018-03-08 DIAGNOSIS — C7889 Secondary malignant neoplasm of other digestive organs: Secondary | ICD-10-CM | POA: Diagnosis not present

## 2018-03-08 LAB — CBC WITH DIFFERENTIAL (CANCER CENTER ONLY)
Basophils Absolute: 0 10*3/uL (ref 0.0–0.1)
Basophils Relative: 0 %
EOS PCT: 0 %
Eosinophils Absolute: 0 10*3/uL (ref 0.0–0.5)
HEMATOCRIT: 29.1 % — AB (ref 34.8–46.6)
Hemoglobin: 9.1 g/dL — ABNORMAL LOW (ref 11.6–15.9)
LYMPHS ABS: 0.7 10*3/uL — AB (ref 0.9–3.3)
LYMPHS PCT: 12 %
MCH: 23.5 pg — AB (ref 25.1–34.0)
MCHC: 31.1 g/dL — ABNORMAL LOW (ref 31.5–36.0)
MCV: 75.6 fL — ABNORMAL LOW (ref 79.5–101.0)
MONO ABS: 0.1 10*3/uL (ref 0.1–0.9)
Monocytes Relative: 3 %
NEUTROS ABS: 4.9 10*3/uL (ref 1.5–6.5)
Neutrophils Relative %: 85 %
PLATELETS: 139 10*3/uL — AB (ref 145–400)
RBC: 3.85 MIL/uL (ref 3.70–5.45)
RDW: 16.8 % — AB (ref 11.2–14.5)
WBC Count: 5.7 10*3/uL (ref 3.9–10.3)

## 2018-03-08 MED ORDER — PANTOPRAZOLE SODIUM 40 MG PO TBEC: 40.0000 mg | DELAYED_RELEASE_TABLET | Freq: Two times a day (BID) | ORAL | 5 refills | Status: DC

## 2018-03-08 MED ORDER — MUPIROCIN 2 % EX OINT: 1.0000 "application " | TOPICAL_OINTMENT | Freq: Two times a day (BID) | CUTANEOUS | 1 refills | Status: DC

## 2018-03-08 MED ORDER — MAGIC MOUTHWASH: 5.0000 mL | Freq: Four times a day (QID) | ORAL | 2 refills | Status: DC | PRN

## 2018-03-08 MED FILL — MAGIC MW (NYS,BEN,MAAL): 12 days supply | Qty: 240 | Fill #0

## 2018-03-08 MED FILL — MUPIROCIN 2% OINTMENT: 2 | 5 days supply | Qty: 22 | Fill #0

## 2018-03-08 NOTE — Progress Notes (Signed)
Pt presents with multiple concerns including dried blood inside of her nose without epistaxis, skin sores on the R side of her nose and along her crown without bleeding or drainage or pain, intermittent fatigue, and soreness and scratchy feeling in her throat that makes swallowing painful without difficulty breathing.  Afebrile.  Denies N/V or changes in bowels/urination.  A&Ox4.

## 2018-03-08 NOTE — Patient Instructions (Signed)

## 2018-03-10 NOTE — Progress Notes (Signed)
[facility] Progress Note   Kaitlyn Duncan [id number] Aug 14, 1970 47 y.o.  Verbie Babic is managed by Dr. Alen Blew  Actively treated with chemotherapy/immunotherapy: no  Assessment: Plan:    Nosebleed - Plan: CBC with Differential ([place] Only), mupirocin ointment (BACTROBAN) 2 %  Sore throat - Plan: magic mouthwash SOLN  Brain metastasis (HCC)  Gastroesophageal reflux disease, esophagitis presence not specified - Plan: pantoprazole (PROTONIX) 40 MG tablet   Nasal bleed with crusting over the right external nares: The patient was given a prescription for Bactroban 2% ointment with instructions to use twice daily in the inside and around the opening of her right nares.    CBC was collected with a platelet count returning normal at 139,000.  Sore throat: The patient was given a prescription for Magic mouthwash.  Metastatic melanoma with brain metastasis: The patient continues to be followed conservatively with watchful waiting by Dr. Alen Blew.  Gastroesophageal reflux disease: Patient was given a refill of Protonix.  Please see After Visit Summary for patient specific instructions.  Future Appointments  Date Time Provider [place]  03/13/2018 10:30 AM [facility] 1 [facility] None  03/13/2018 11:00 AM Shadad, Mathis Dad, MD [facility] None  03/30/2018  9:30 AM Bruning, Ashlyn, PA-C [facility] None    Orders Placed This Encounter  Procedures  . CBC with Differential ([facility] Only)       Subjective:   Patient ID:  Kaitlyn Duncan is a 47 y.o. (DOB 1971/06/22) female.  Chief Complaint:  Chief Complaint  Patient presents with  . Sores    HPI Kaitlyn Duncan is 47 year old woman with a diagnosis of a metastatic melanoma dating to July 2019 when she presented with subcutaneous nodules, peritoneal involvement, lung metastasis, GI track involvement as well as brain metastasis. She was treated  with a total of 10 fractions of whole brain radiation which began on 02/07/2018. Ms. Rinkenberger was last seen by Dr. Alen Blew on [place] 02/20/2018.  She presents to the clinic today with report of a sore throat with a raspy dry throat for 1 week.  She also reports having dried blood on the inner right nares with crusting around the external right nares.  She is also noted several sore areas in her scalp.  She also reports her cheeks have been red.  Her most recent MRI of her brain shows at least 15 metastatic lesions within the brain with innumerable subcutaneous metastatic deposits.  Medications: I have reviewed the patient's current medications.  Allergies: No Known Allergies  Past Medical History:  Diagnosis Date  . Anemia   . Anemia   . Diabetes mellitus without complication (HCC)    prediabetic  . Seasonal allergies     Past Surgical History:  Procedure Laterality Date  . BIOPSY  01/28/2018   Procedure: BIOPSY;  Surgeon: Laurence Spates, MD;  Location: [facility];  Service: Endoscopy;;  . ESOPHAGOGASTRODUODENOSCOPY N/A 01/28/2018   Procedure: ESOPHAGOGASTRODUODENOSCOPY (EGD);  Surgeon: Laurence Spates, MD;  Location: [facility];  Service: Endoscopy;  Laterality: N/A;  . MOUTH SURGERY    . OVARY SURGERY     removed "something"    Family History  Problem Relation Age of Onset  . Hypertension Father   . Breast cancer Maternal Aunt   . Diabetes Maternal Aunt   . Breast cancer Paternal Aunt   . Breast cancer Maternal Grandmother   . Breast cancer Paternal Grandmother   . Diabetes Paternal Grandmother  Social History   Socioeconomic History  . Marital status: Single    Spouse name: Not on file  . Number of children: Not on file  . Years of education: Not on file  . Highest education level: Not on file  Occupational History  . Not on file  Social Needs  . Financial resource strain: Not on file  . Food insecurity:    Worry: Not on file    Inability: Not on file  .  Transportation needs:    Medical: Not on file    Non-medical: Not on file  Tobacco Use  . Smoking status: Never Smoker  . Smokeless tobacco: Never Used  Substance and Sexual Activity  . Alcohol use: No  . Drug use: No  . Sexual activity: Not Currently  Lifestyle  . Physical activity:    Days per week: Not on file    Minutes per session: Not on file  . Stress: Not on file  Relationships  . Social connections:    Talks on phone: Not on file    Gets together: Not on file    Attends religious service: Not on file    Active member of club or organization: Not on file    Attends meetings of clubs or organizations: Not on file    Relationship status: Not on file  . Intimate partner violence:    Fear of current or ex partner: Not on file    Emotionally abused: Not on file    Physically abused: Not on file    Forced sexual activity: Not on file  Other Topics Concern  . Not on file  Social History Narrative  . Not on file    Past Medical History, Surgical history, Social history, and Family history were reviewed and updated as appropriate.   Please see review of systems for further details on the patient's review from today.   Review of Systems:  Review of Systems  Constitutional: Negative for chills and diaphoresis.  HENT: Positive for nosebleeds and sore throat. Negative for congestion, postnasal drip, sinus pressure, sinus pain and trouble swallowing.   Respiratory: Negative for cough, shortness of breath and wheezing.   Cardiovascular: Negative for chest pain and palpitations.  Skin:       Multiple sore lesions in the scalp.  Neurological: Negative for headaches.    Objective:   Physical Exam:  BP 114/68 (BP Location: Right Arm, Patient Position: Sitting)   Pulse 97   Temp 98.8 F (37.1 C) (Oral)   Resp 19   Ht 5' (1.524 m)   Wt 152 lb 9.6 oz (69.2 kg)   SpO2 100%   BMI 29.80 kg/m  ECOG: 1  Physical Exam  Constitutional:  Kaitlyn Duncan is an adult chronically  ill-appearing female who is ambulating with the use of a rolling walker.  She appears to be in no acute distress.  HENT:  Mouth/Throat: Oropharynx is clear and moist.  Partial alopecia is noted with several subcutaneous nodules appreciated.  An area of bleeding is noted in the right nares with crusting over the external right nares.  Cardiovascular: Normal rate, regular rhythm and normal heart sounds. Exam reveals no gallop and no friction rub.  No murmur heard. Pulmonary/Chest: Effort normal and breath sounds normal. No stridor. No respiratory distress. She has no wheezes. She has no rales.  Neurological: She is alert. Coordination (The patient is ambulating with use of a rolling walker.) abnormal.  Skin: Skin is warm and dry. No rash noted.  No erythema.    Lab Review:     Component Value Date/Time   NA 134 (L) 02/20/2018 1027   K 4.3 02/20/2018 1027   CL 102 02/20/2018 1027   CO2 23 02/20/2018 1027   GLUCOSE 219 (H) 02/20/2018 1027   BUN 33 (H) 02/20/2018 1027   CREATININE 0.85 02/20/2018 1027   CALCIUM 8.6 (L) 02/20/2018 1027   PROT 6.4 (L) 02/20/2018 1027   ALBUMIN 3.6 02/20/2018 1027   AST 14 (L) 02/20/2018 1027   ALT 20 02/20/2018 1027   ALKPHOS 59 02/20/2018 1027   BILITOT 0.4 02/20/2018 1027   GFRNONAA >60 02/20/2018 1027   GFRAA >60 02/20/2018 1027       Component Value Date/Time   WBC 5.7 03/08/2018 1503   WBC 12.1 (H) 01/29/2018 0350   RBC 3.85 03/08/2018 1503   HGB 9.1 (L) 03/08/2018 1503   HCT 29.1 (L) 03/08/2018 1503   PLT 139 (L) 03/08/2018 1503   MCV 75.6 (L) 03/08/2018 1503   MCH 23.5 (L) 03/08/2018 1503   MCHC 31.1 (L) 03/08/2018 1503   RDW 16.8 (H) 03/08/2018 1503   LYMPHSABS 0.7 (L) 03/08/2018 1503   MONOABS 0.1 03/08/2018 1503   EOSABS 0.0 03/08/2018 1503   BASOSABS 0.0 03/08/2018 1503   -------------------------------  Imaging from last 24 hours (if applicable):  Radiology interpretation: No results found.

## 2018-03-13 ENCOUNTER — Telehealth: Payer: Self-pay | Admitting: Oncology

## 2018-03-13 ENCOUNTER — Inpatient Hospital Stay (HOSPITAL_BASED_OUTPATIENT_CLINIC_OR_DEPARTMENT_OTHER): Payer: Medicaid Other | Admitting: Oncology

## 2018-03-13 ENCOUNTER — Inpatient Hospital Stay: Payer: Medicaid Other

## 2018-03-13 VITALS — BP 100/81 | HR 84 | Temp 98.5°F | Resp 18 | Ht 60.0 in | Wt 151.8 lb

## 2018-03-13 DIAGNOSIS — C7800 Secondary malignant neoplasm of unspecified lung: Secondary | ICD-10-CM

## 2018-03-13 DIAGNOSIS — C792 Secondary malignant neoplasm of skin: Secondary | ICD-10-CM

## 2018-03-13 DIAGNOSIS — Z79899 Other long term (current) drug therapy: Secondary | ICD-10-CM

## 2018-03-13 DIAGNOSIS — C779 Secondary and unspecified malignant neoplasm of lymph node, unspecified: Secondary | ICD-10-CM | POA: Diagnosis not present

## 2018-03-13 DIAGNOSIS — C439 Malignant melanoma of skin, unspecified: Secondary | ICD-10-CM | POA: Diagnosis not present

## 2018-03-13 DIAGNOSIS — C7931 Secondary malignant neoplasm of brain: Secondary | ICD-10-CM

## 2018-03-13 LAB — CBC WITH DIFFERENTIAL (CANCER CENTER ONLY)
BASOS ABS: 0 10*3/uL (ref 0.0–0.1)
BASOS PCT: 0 %
Eosinophils Absolute: 0 10*3/uL (ref 0.0–0.5)
Eosinophils Relative: 0 %
HCT: 30.8 % — ABNORMAL LOW (ref 34.8–46.6)
HEMOGLOBIN: 9.3 g/dL — AB (ref 11.6–15.9)
Lymphocytes Relative: 20 %
Lymphs Abs: 1 10*3/uL (ref 0.9–3.3)
MCH: 23.6 pg — ABNORMAL LOW (ref 25.1–34.0)
MCHC: 30.2 g/dL — ABNORMAL LOW (ref 31.5–36.0)
MCV: 78.2 fL — ABNORMAL LOW (ref 79.5–101.0)
Monocytes Absolute: 0.4 10*3/uL (ref 0.1–0.9)
Monocytes Relative: 8 %
NEUTROS PCT: 72 %
Neutro Abs: 3.6 10*3/uL (ref 1.5–6.5)
Platelet Count: 131 10*3/uL — ABNORMAL LOW (ref 145–400)
RBC: 3.94 MIL/uL (ref 3.70–5.45)
RDW: 15.7 % — ABNORMAL HIGH (ref 11.2–14.5)
WBC: 5 10*3/uL (ref 3.9–10.3)

## 2018-03-13 LAB — CMP (CANCER CENTER ONLY)
ALK PHOS: 57 U/L (ref 38–126)
ALT: 27 U/L (ref 0–44)
AST: 26 U/L (ref 15–41)
Albumin: 3.3 g/dL — ABNORMAL LOW (ref 3.5–5.0)
Anion gap: 9 (ref 5–15)
BUN: 15 mg/dL (ref 6–20)
CALCIUM: 8.8 mg/dL — AB (ref 8.9–10.3)
CHLORIDE: 107 mmol/L (ref 98–111)
CO2: 25 mmol/L (ref 22–32)
Creatinine: 0.65 mg/dL (ref 0.44–1.00)
Glucose, Bld: 136 mg/dL — ABNORMAL HIGH (ref 70–99)
Potassium: 3.4 mmol/L — ABNORMAL LOW (ref 3.5–5.1)
Sodium: 141 mmol/L (ref 135–145)
TOTAL PROTEIN: 6.1 g/dL — AB (ref 6.5–8.1)

## 2018-03-13 LAB — CK: CK TOTAL: 37 U/L — AB (ref 38–234)

## 2018-03-13 NOTE — Telephone Encounter (Signed)
Gave pt avs and calendar  °

## 2018-03-13 NOTE — Telephone Encounter (Signed)
Pt called on 9/8 to schedule a ride for 9/9. Called pt and left vm with ride information.

## 2018-03-13 NOTE — Progress Notes (Signed)
Hematology and Oncology Follow Up Visit  Kaitlyn Duncan [id number] 12-Aug-1970 47 y.o. 03/13/2018 11:11 AM Kaitlyn Duncan, Kaitlyn Duncan, NPPlacey, Kaitlyn Muscat, NP   Principle Diagnosis: 47 year old woman with BRAF positive stage IV melanoma from an unknown primary diagnosed in July 2019. She presented with subcutaneous nodules, peritoneal involvement, lung metastasis, CNS metastasis and lymphadenopathy.   Prior Therapy: He is status post subcutaneous nodule biopsy on 01/27/2018 which confirmed the presence of metastatic melanoma. She status post endoscopy on 01/28/2018 which showed subcutaneous involvement that consistent with metastatic melanoma.  Whole brain radiation for total of 30 Gy in 10 fractions started on 02/07/2018.  Current therapy:  Mektovi 45 mg bid, [name] 450 mg daily started in August 2019.    Interim History: Kaitlyn Duncan for a follow-up visit.  Since her last visit, she started Mektovi 45 mg bid and [name] 450 mg daily without any major complications.  She does report some mild fatigue and dyspepsia but otherwise no major concerns.  She denies any seizures or syncope.  She denies any alteration in mental status.  She continues to ambulate without any major difficulties although she does report unsteadiness.  She did report some sores around her nasal passages which has improved with topical applications.   She does not report any headaches, blurry vision.  She denies any dizziness or lethargy.  Does not report any fevers, chills or sweats.  Does not report any cough, wheezing or hemoptysis.  Does not report any chest pain, palpitation, orthopnea or leg edema.  Does not report any nausea, vomiting or abdominal pain.  Does not report any changes in bowel habits.  She denies any hematochezia or melena. Does not report any bone pain or pathological fractures.  Does not report frequency, urgency or hematuria.  Does not report any skin rashes or lesions. .  Does not report any lymphadenopathy  or petechiae.  Does not report any anxiety or depression.  Remaining review of systems is negative.    Medications: I have reviewed the patient's current medications.  Current Outpatient Medications  Medication Sig Dispense Refill  . acetaminophen (TYLENOL) 325 MG tablet Take 650 mg by mouth every 6 (six) hours as needed.    . Binimetinib (MEKTOVI) 15 MG TABS Take 45 mg by mouth 2 (two) times daily. 180 tablet 0  . dexamethasone (DECADRON) 4 MG tablet Take 1 tablet (4 mg total) by mouth 2 (two) times daily. And follow taper instructions provided at recent office visit 60 tablet 0  . Encorafenib ([name]) 75 MG CAPS Take 450 mg by mouth daily. 180 capsule 0  . levETIRAcetam (KEPPRA) 1000 MG tablet Take 1 tablet (1,000 mg total) by mouth 2 (two) times daily. 60 tablet 2  . loratadine (CLARITIN) 10 MG tablet Take 10 mg by mouth daily.    . magic mouthwash SOLN Take 5 mLs by mouth 4 (four) times daily as needed for mouth pain. 250 mL 2  . Multiple Vitamin (MULTIVITAMIN WITH MINERALS) TABS tablet Take 1 tablet by mouth daily.    . mupirocin ointment (BACTROBAN) 2 % Place 1 application into the nose 2 (two) times daily. 30 g 1  . ondansetron (ZOFRAN) 8 MG tablet Take 1 tablet (8 mg total) by mouth every 8 (eight) hours as needed for nausea or vomiting. 20 tablet 0  . oxybutynin (DITROPAN) 5 MG tablet Take 1 tablet (5 mg total) by mouth 3 (three) times daily. 90 tablet 5  . pantoprazole (PROTONIX) 40 MG tablet Take 1 tablet (40  mg total) by mouth 2 (two) times daily. 60 tablet 5   No current facility-administered medications for this visit.      Allergies: No Known Allergies  Past Medical History, Surgical history, Social history, and Family History were reviewed and updated.  Review of Systems:    Physical Exam:   ECOG: 1  General appearance: Alert, awake without any distress. Head:  Subcutaneous nodules noted.  Not change on previous examination. Oropharynx: Without any thrush or  ulcers. Eyes: No scleral icterus. Lymph nodes:  Decrease in her axillary adenopathy bilaterally. Heart:regular rate and rhythm, without any murmurs or gallops.   Lung: Clear to auscultation without any rhonchi, wheezes or dullness to percussion. Abdomin: Soft, nontender without any shifting dullness or ascites. Musculoskeletal: No clubbing or cyanosis. Neurological: No motor or sensory deficits. Skin: No rashes or lesions. Psychiatric: Mood and affect appeared normal.        Lab Results: Lab Results  Component Value Date   WBC 5.7 03/08/2018   HGB 9.1 (L) 03/08/2018   HCT 29.1 (L) 03/08/2018   MCV 75.6 (L) 03/08/2018   PLT 139 (L) 03/08/2018     Chemistry      Component Value Date/Time   NA 134 (L) 02/20/2018 1027   K 4.3 02/20/2018 1027   CL 102 02/20/2018 1027   CO2 23 02/20/2018 1027   BUN 33 (H) 02/20/2018 1027   CREATININE 0.85 02/20/2018 1027      Component Value Date/Time   CALCIUM 8.6 (L) 02/20/2018 1027   ALKPHOS 59 02/20/2018 1027   AST 14 (L) 02/20/2018 1027   ALT 20 02/20/2018 1027   BILITOT 0.4 02/20/2018 1027        Impression and Plan:   47 year old woman with:  1.  BRAF positive advanced melanoma with unknown primary diagnosed in July 2019.  She has multiple site of metastasis including subcutaneous nodules, lymphadenopathy among others.      She has started on Mektovi 45 mg bid and [name] 450 mg daily with few complications.  Risks and benefits of continuing this medication long-term was reviewed today.  I recommended continuing this therapy for the next few months and potentially switch her to immunotherapy after the bulk of her disease has decreased.  Is agreeable to proceed with this plan.  2.  CNS metastasis: No evidence of recurrent disease at this time.  3.  Prognosis: Treatment remains palliative at this time.  Her performance status is excellent and aggressive therapy is warranted.  4.  Psychosocial consideration: She continues  to be followed by social services to provide psychosocial support and financial assistance.  5.  GI bleed: No evidence of GI bleed noted.  Her hemoglobin remained stable at this time.  6.  Follow-up: We will be in 3 weeks to follow her progress.  25  minutes was spent with the patient face-to-face today.  More than 50% of time was dedicated to her disease entity, treatment choices and management complications related to this therapy.   Zola Button, MD 9/9/201911:11 AM

## 2018-03-27 MED FILL — MAGIC MW (NYS,BEN,MAAL): 12 days supply | Qty: 240 | Fill #1

## 2018-03-28 ENCOUNTER — Telehealth: Payer: Self-pay

## 2018-03-28 NOTE — Telephone Encounter (Signed)
Oral Oncology Patient Advocate Encounter  [name] and Irean Hong have been delivered to my office. I called the patient and left her a voicemail letting her know she can come to the [facility] and pick them up Mon-Fri 8-4:30.  Will update when she picks up  [facility] [place] Phone [phone] Fax [phone]

## 2018-03-30 ENCOUNTER — Ambulatory Visit
Admission: RE | Admit: 2018-03-30 | Discharge: 2018-03-30 | Disposition: A | Discharge status: Home / Self Care | Payer: Medicaid Other | Source: Ambulatory Visit | Attending: Urology | Admitting: Urology

## 2018-03-30 ENCOUNTER — Encounter: Payer: Self-pay | Admitting: Urology

## 2018-03-30 VITALS — BP 122/92 | HR 85 | Temp 98.4°F | Resp 18 | Ht 60.0 in | Wt 153.0 lb

## 2018-03-30 DIAGNOSIS — C7931 Secondary malignant neoplasm of brain: Secondary | ICD-10-CM | POA: Diagnosis not present

## 2018-03-30 DIAGNOSIS — Z923 Personal history of irradiation: Secondary | ICD-10-CM | POA: Insufficient documentation

## 2018-03-30 DIAGNOSIS — Z79899 Other long term (current) drug therapy: Secondary | ICD-10-CM | POA: Insufficient documentation

## 2018-03-30 NOTE — Telephone Encounter (Signed)
Oral Oncology Patient Advocate Encounter  Patient came to pick up her [name] and Mektovi from me 03/30/18 at 10:15am  [place] [facility] [place] Phone [phone] Fax [phone]

## 2018-03-30 NOTE — Progress Notes (Signed)
Radiation Oncology         [phone] ________________________________  Name: Kaitlyn Duncan MRN: [id number]  Date: 03/30/2018  DOB: 06-Dec-1970  Post Treatment Note  CC: [name], [name], NP  [name], [name], NP  Diagnosis:   47 yo woman with over 50 brain metastases fromstage IVmelanoma  Interval Since Last Radiation:  6 weeks  02/06/2018 to 02/17/2018:   The whole brain was treated to 30 Gy in 10 fractions of 3 Gy  Narrative:  The patient returns today for routine follow-up. She tolerated radiation treatment relatively well. Denies pain. She remained on Decadron 4 mg tid throughout her treatment without evidence of thrush noted. She denied headaches, tinnitus, diplopia, nausea or vomiting. She did note some mild alopecia towards the end of treatment. She experienced dry mouth, mild dizziness and unsteady gait during her treatment as well as excessive gas and belching despite taking protonix bid.   She reported moderate to severe fatigue but able to ambulate with aid of walker to stay active.                               On review of systems, the patient states that she is doing very well overall.  She has recently completed her Decadron taper and has not had any significant headache, tinnitus, dizziness/imbalance, tremor or seizure activity.  She denies chest pain, shortness of breath, wheezing, cough or hemoptysis.  She has continued taking Keppra as prescribed.  She reports a healthy appetite and is maintaining her weight.  She denies any abdominal pain, nausea, vomiting or diarrhea.  She has recently started systemic chemotherapy under the care and direction of Dr. Alen Blew with Mektovi 45 mg bid and [name] 450 mg daily started on March 02, 2018.  She feels that she is tolerating this medication well.  She was last seen by Dr. Alen Blew on 03/13/2018 and the plan at that time was to continue with her current systemic chemo regimen for a couple more months prior to obtaining restaging  scans.  She has noted significant improvement, in fact, almost resolution of the palpable scalp lesions which she is quite pleased with. She has mild itching of her scalp and a sore, scratchy throat but otherwise feels well and is quite pleased with her progress to date.  ALLERGIES:  has No Known Allergies.  Meds: Current Outpatient Medications  Medication Sig Dispense Refill  . acetaminophen (TYLENOL) 325 MG tablet Take 650 mg by mouth every 6 (six) hours as needed.    . Binimetinib (MEKTOVI) 15 MG TABS Take 45 mg by mouth 2 (two) times daily. 180 tablet 0  . dexamethasone (DECADRON) 4 MG tablet Take 1 tablet (4 mg total) by mouth 2 (two) times daily. And follow taper instructions provided at recent office visit 60 tablet 0  . Encorafenib ([name]) 75 MG CAPS Take 450 mg by mouth daily. 180 capsule 0  . levETIRAcetam (KEPPRA) 1000 MG tablet Take 1 tablet (1,000 mg total) by mouth 2 (two) times daily. 60 tablet 2  . loratadine (CLARITIN) 10 MG tablet Take 10 mg by mouth daily.    . magic mouthwash SOLN Take 5 mLs by mouth 4 (four) times daily as needed for mouth pain. 250 mL 2  . mupirocin ointment (BACTROBAN) 2 % Place 1 application into the nose 2 (two) times daily. 30 g 1  . oxybutynin (DITROPAN) 5 MG tablet Take 1 tablet (5 mg total)  by mouth 3 (three) times daily. 90 tablet 5  . pantoprazole (PROTONIX) 40 MG tablet Take 1 tablet (40 mg total) by mouth 2 (two) times daily. 60 tablet 5  . Multiple Vitamin (MULTIVITAMIN WITH MINERALS) TABS tablet Take 1 tablet by mouth daily.    . ondansetron (ZOFRAN) 8 MG tablet Take 1 tablet (8 mg total) by mouth every 8 (eight) hours as needed for nausea or vomiting. (Patient not taking: Reported on 03/30/2018) 20 tablet 0   No current facility-administered medications for this encounter.     Physical Findings:  height is 5' (1.524 m) and weight is 153 lb (69.4 kg). Her oral temperature is 98.4 F (36.9 C). Her blood pressure is 122/92 (abnormal) and  her pulse is 85. Her respiration is 18 and oxygen saturation is 100%.  Pain Assessment Pain Score: 0-No pain/10 In general this is a well appearing Caucasian female in no acute distress. She's alert and oriented x4 and appropriate throughout the examination. Cardiopulmonary assessment is negative for acute distress and She exhibits normal effort. She appears grossly neurologically intact and is ambulating with steady gait without the assistance of a walker or cane. EOMs intact bilaterally and PERRLA. Patchy alopecia is noted with only a very few visible/palpable scalp lesions remaining.  Scalp is non tender to palpation and no evidence of skin irritation or desquamation. Sensation is intact to light touch and strength is 5/5 and equal in bilateral upper and lower extremities.  She has mild residual thrush noted on her tongue but no obvious involvement of the buccal mucosa or pharynx.  Lab Findings: Lab Results  Component Value Date   WBC 5.0 03/13/2018   HGB 9.3 (L) 03/13/2018   HCT 30.8 (L) 03/13/2018   MCV 78.2 (L) 03/13/2018   PLT 131 (L) 03/13/2018     Radiographic Findings: No results found.  Impression/Plan: 49. 47 yo woman with over 50 brain metastases fromstage IVmelanoma. She appears to have recovered well from the effects of her recent radiotherapy.  She has recently completed her Decadron taper but continues taking Keppra as prescribed.  We discussed the plan for posttreatment MRI brain in approximately 2 months to assess her treatment response and monitor for any new or progressive disease.  Her case and imaging will be presented and reviewed at multidisciplinary brain conference and we will see her back in the office thereafter to discuss these results and any further treatment recommendations.  Pending stability on this scan, we will continue to monitor closely with serial MRI brain scans every 3 months.  She will also continue in routine follow-up with Dr. Alen Blew for continued  disease management with systemic therapy under his care and direction.  She appears to have a good understanding of her disease and our recommendations and is in agreement with the stated plan.  She knows to call at any time in the interim with any questions or concerns.  2. Oral cadidiasis.  Continue magic mouthwash as prescribed.  Advised to swish and swallow to see if this will help with her sore throat as well.  She will call if sxs progress ir persist despite treatment.     Nicholos Johns, PA-C

## 2018-04-03 ENCOUNTER — Inpatient Hospital Stay (HOSPITAL_BASED_OUTPATIENT_CLINIC_OR_DEPARTMENT_OTHER): Payer: Medicaid Other | Admitting: Oncology

## 2018-04-03 ENCOUNTER — Inpatient Hospital Stay: Payer: Medicaid Other

## 2018-04-03 VITALS — BP 131/87 | HR 91 | Temp 98.4°F | Resp 18 | Ht 60.0 in | Wt 153.1 lb

## 2018-04-03 DIAGNOSIS — Z1509 Genetic susceptibility to other malignant neoplasm: Secondary | ICD-10-CM

## 2018-04-03 DIAGNOSIS — C7800 Secondary malignant neoplasm of unspecified lung: Secondary | ICD-10-CM | POA: Diagnosis not present

## 2018-04-03 DIAGNOSIS — C786 Secondary malignant neoplasm of retroperitoneum and peritoneum: Secondary | ICD-10-CM | POA: Diagnosis not present

## 2018-04-03 DIAGNOSIS — C439 Malignant melanoma of skin, unspecified: Secondary | ICD-10-CM

## 2018-04-03 DIAGNOSIS — Z923 Personal history of irradiation: Secondary | ICD-10-CM

## 2018-04-03 DIAGNOSIS — C7949 Secondary malignant neoplasm of other parts of nervous system: Secondary | ICD-10-CM

## 2018-04-03 DIAGNOSIS — Z79899 Other long term (current) drug therapy: Secondary | ICD-10-CM

## 2018-04-03 DIAGNOSIS — Z90710 Acquired absence of both cervix and uterus: Secondary | ICD-10-CM

## 2018-04-03 LAB — CMP (CANCER CENTER ONLY)
ALBUMIN: 3.7 g/dL (ref 3.5–5.0)
ALK PHOS: 74 U/L (ref 38–126)
ALT: 13 U/L (ref 0–44)
ANION GAP: 9 (ref 5–15)
AST: 17 U/L (ref 15–41)
BUN: 13 mg/dL (ref 6–20)
CALCIUM: 9.2 mg/dL (ref 8.9–10.3)
CO2: 24 mmol/L (ref 22–32)
Chloride: 106 mmol/L (ref 98–111)
Creatinine: 0.83 mg/dL (ref 0.44–1.00)
GFR, Est AFR Am: >60 mL/min (ref >60)
GFR, Estimated: >60 mL/min (ref >60)
Glucose, Bld: 171 mg/dL — ABNORMAL HIGH (ref 70–99)
POTASSIUM: 4 mmol/L (ref 3.5–5.1)
Sodium: 139 mmol/L (ref 135–145)
TOTAL PROTEIN: 6.9 g/dL (ref 6.5–8.1)

## 2018-04-03 LAB — CBC WITH DIFFERENTIAL (CANCER CENTER ONLY)
BASOS ABS: 0 10*3/uL (ref 0.0–0.1)
BASOS PCT: 0 %
Eosinophils Absolute: 0 10*3/uL (ref 0.0–0.5)
Eosinophils Relative: 0 %
HEMATOCRIT: 33.4 % — AB (ref 34.8–46.6)
HEMOGLOBIN: 9.9 g/dL — AB (ref 11.6–15.9)
LYMPHS PCT: 17 %
Lymphs Abs: 0.9 10*3/uL (ref 0.9–3.3)
MCH: 22.4 pg — ABNORMAL LOW (ref 25.1–34.0)
MCHC: 29.6 g/dL — ABNORMAL LOW (ref 31.5–36.0)
MCV: 75.7 fL — AB (ref 79.5–101.0)
Monocytes Absolute: 0.7 10*3/uL (ref 0.1–0.9)
Monocytes Relative: 13 %
NEUTROS ABS: 3.8 10*3/uL (ref 1.5–6.5)
Neutrophils Relative %: 70 %
Platelet Count: 96 10*3/uL — ABNORMAL LOW (ref 145–400)
RBC: 4.41 MIL/uL (ref 3.70–5.45)
RDW: 15.6 % — AB (ref 11.2–14.5)
WBC: 5.4 10*3/uL (ref 3.9–10.3)

## 2018-04-03 NOTE — Progress Notes (Signed)
Hematology and Oncology Follow Up Visit  Kaitlyn Duncan [id number] 1971/05/16 47 y.o. 04/03/2018 10:17 AM [name], Kaitlyn Duncan, NPPlacey, Kaitlyn Muscat, NP   Principle Diagnosis: 47 year old woman with stage IV melanoma from an unknown primary after presenting with subcutaneous nodules, peritoneal involvement, lung metastasis, CNS metastasis and lymphadenopathy in July 2019.  Her tumor is BRAF positive   Prior Therapy: He is status post subcutaneous nodule biopsy on 01/27/2018 which confirmed the presence of metastatic melanoma. She status post endoscopy on 01/28/2018 which showed subcutaneous involvement that consistent with metastatic melanoma.  Whole brain radiation for total of 30 Gy in 10 fractions started on 02/07/2018.  Current therapy:  Mektovi 45 mg bid, [name] 450 mg daily started in August 2019.    Interim History: Kaitlyn Duncan is here for a follow-up visit.  Since her last visit, she reports no major changes in her health.  She does report some mild fatigue overall associated with her therapy.  She has been tapered off dexamethasone and currently experiencing some lethargy and mild headaches associated with sterile withdrawal.  She still ambulating without any falls or syncope.  She denies any new lesions associated with her melanoma.  No new lymphadenopathy.  She continues to tolerate Mektovi and [name] without any new toxicity.   She does not report any headaches, blurry vision.  She denies any alteration in mental status or confusion.  Does not report any fevers, chills or sweats.  Does not report any cough, wheezing or hemoptysis.  Does not report any chest pain, palpitation, orthopnea or leg edema.  Does not report any nausea, vomiting or abdominal pain.  Does not report any constipation or diarrhea.  She denies any hematochezia or melena. Does not report any paralysis or myalgias.  Does not report frequency, urgency or hematuria.  Does not report any skin rashes or lesions. .  Does  not report any lymphadenopathy or petechiae.  Does not report any changes in mood.  Remaining review of systems is negative.    Medications: I have reviewed the patient's current medications.  Current Outpatient Medications  Medication Sig Dispense Refill  . acetaminophen (TYLENOL) 325 MG tablet Take 650 mg by mouth every 6 (six) hours as needed.    . Binimetinib (MEKTOVI) 15 MG TABS Take 45 mg by mouth 2 (two) times daily. 180 tablet 0  . dexamethasone (DECADRON) 4 MG tablet Take 1 tablet (4 mg total) by mouth 2 (two) times daily. And follow taper instructions provided at recent office visit 60 tablet 0  . Encorafenib ([name]) 75 MG CAPS Take 450 mg by mouth daily. 180 capsule 0  . levETIRAcetam (KEPPRA) 1000 MG tablet Take 1 tablet (1,000 mg total) by mouth 2 (two) times daily. 60 tablet 2  . loratadine (CLARITIN) 10 MG tablet Take 10 mg by mouth daily.    . magic mouthwash SOLN Take 5 mLs by mouth 4 (four) times daily as needed for mouth pain. 250 mL 2  . Multiple Vitamin (MULTIVITAMIN WITH MINERALS) TABS tablet Take 1 tablet by mouth daily.    . mupirocin ointment (BACTROBAN) 2 % Place 1 application into the nose 2 (two) times daily. 30 g 1  . ondansetron (ZOFRAN) 8 MG tablet Take 1 tablet (8 mg total) by mouth every 8 (eight) hours as needed for nausea or vomiting. (Patient not taking: Reported on 03/30/2018) 20 tablet 0  . oxybutynin (DITROPAN) 5 MG tablet Take 1 tablet (5 mg total) by mouth 3 (three) times daily. 90 tablet 5  .  pantoprazole (PROTONIX) 40 MG tablet Take 1 tablet (40 mg total) by mouth 2 (two) times daily. 60 tablet 5   No current facility-administered medications for this visit.      Allergies: No Known Allergies  Past Medical History, Surgical history, Social history, and Family History were reviewed and updated.  Review of Systems:    Physical Exam:  Blood pressure 131/87, pulse 91, temperature 98.4 F (36.9 C), temperature source Oral, resp. rate 18,  height 5' (1.524 m), weight 153 lb 1.6 oz (69.4 kg), SpO2 99 %.   ECOG: 1  General appearance: Comfortable appearing without any discomfort Head: Normocephalic without any trauma Oropharynx: Mucous membranes are moist and pink without any thrush or ulcers. Eyes: Pupils are equal and round reactive to light. Lymph nodes: No cervical, supraclavicular, inguinal adenopathy.  Her axillary adenopathy is regressing. Heart:regular rate and rhythm.  S1 and S2 without leg edema. Lung: Clear without any rhonchi or wheezes.  No dullness to percussion. Abdomin: Soft, nontender, nondistended with good bowel sounds.  No hepatosplenomegaly. Musculoskeletal: No joint deformity or effusion.  Full range of motion noted. Neurological: No deficits noted on motor, sensory and deep tendon reflex exam. Skin: No petechial rash or dryness.  Appeared moist.          Lab Results: Lab Results  Component Value Date   WBC 5.0 03/13/2018   HGB 9.3 (L) 03/13/2018   HCT 30.8 (L) 03/13/2018   MCV 78.2 (L) 03/13/2018   PLT 131 (L) 03/13/2018     Chemistry      Component Value Date/Time   NA 141 03/13/2018 1057   K 3.4 (L) 03/13/2018 1057   CL 107 03/13/2018 1057   CO2 25 03/13/2018 1057   BUN 15 03/13/2018 1057   CREATININE 0.65 03/13/2018 1057      Component Value Date/Time   CALCIUM 8.8 (L) 03/13/2018 1057   ALKPHOS 57 03/13/2018 1057   AST 26 03/13/2018 1057   ALT 27 03/13/2018 1057   BILITOT <0.2 (L) 03/13/2018 1057        Impression and Plan:   47 year old woman with:  1.  Stage IV melanoma that is BRAF positive with widespread metastatic disease.  Remains on Mektovi 45 mg bid and [name] 450 mg daily without any new complications.  Risks and benefits of continuing these medication long term were reviewed again.  Alternatively, switching to immunotherapy would be an option after she experiences maximal clinical response.  The plan is to repeat imaging studies before the next visit  and determine whether to continue with this therapy versus switching to immunotherapy at the time.  2.  CNS metastasis: There are no clinical signs or symptoms to suggest recurrent disease.  She will have repeat MRI in the future as needed.  3.  Prognosis: Therapy remains palliative at this time and her disease is incurable.  Her performance status remains adequate and aggressive therapy continues to be warranted.  4.  Psychosocial consideration: Her condition remains manageable at this time.  She has been able to find a temporary home and has been getting assistant for transportation at this time.  5.  Cardiac monitoring: We will repeat echocardiogram and November 2019.  6.  Follow-up: We will be in 4 weeks to follow her progress.  25  minutes was spent with the patient face-to-face today.  More than 50% of time was dedicated to discussing the natural course of her disease, treatment options and managing complications related to therapy.   Roxy Cedar  Alen Blew, MD 9/30/201910:17 AM

## 2018-04-03 NOTE — Addendum Note (Signed)
Addended by: Sharlynn Oliphant A on: 04/03/2018 10:56 AM   Modules accepted: Orders

## 2018-04-05 MED FILL — levETIRAcetam 1000 MG TABS: 1000 | 30 days supply | Qty: 60 | Fill #2

## 2018-04-12 MED FILL — OXYBUTYNIN CHLORIDE 5 MG TA: 5 | 30 days supply | Qty: 90 | Fill #1

## 2018-04-13 ENCOUNTER — Telehealth: Payer: Self-pay | Admitting: *Deleted

## 2018-04-13 NOTE — Telephone Encounter (Signed)
Patient called lm stating she is having some symptoms that she is unsure if she needs to be seen for.  She did not elaborate on symptoms.  Returned call lm with details to speak with Triage nurse to determine if she needs to be seen by [facility] clinic.

## 2018-04-17 ENCOUNTER — Telehealth: Payer: Self-pay | Admitting: *Deleted

## 2018-04-17 NOTE — Telephone Encounter (Signed)
Patient left message stating she had questions regarding her treatment. Attempt made to return call- left voice message to return call.

## 2018-04-25 ENCOUNTER — Telehealth: Payer: Self-pay | Admitting: Pharmacist

## 2018-04-25 NOTE — Telephone Encounter (Signed)
Oral Oncology Pharmacist Encounter  Received call for dispensing pharmacy from [place] to set up next shipment of patient's [name] and Mektovi.  Confirmed shipment, they will be delivered to the office on Thursday, 04/27/2018. When prescriptions are received we will contact the patient to coordinate medication acquisition with the patient.  Johny Drilling, PharmD, BCPS, BCOP  04/25/2018 1:25 PM Oral Oncology Clinic [phone]

## 2018-04-26 ENCOUNTER — Other Ambulatory Visit: Payer: Self-pay | Admitting: *Deleted

## 2018-04-26 ENCOUNTER — Telehealth: Payer: Self-pay | Admitting: Oncology

## 2018-04-26 DIAGNOSIS — K219 Gastro-esophageal reflux disease without esophagitis: Secondary | ICD-10-CM

## 2018-04-26 MED ORDER — PANTOPRAZOLE SODIUM 40 MG PO TBEC: 40.0000 mg | DELAYED_RELEASE_TABLET | Freq: Two times a day (BID) | ORAL | 1 refills | Status: DC

## 2018-04-26 MED ORDER — LORATADINE 10 MG PO TABS: 10.0000 mg | ORAL_TABLET | Freq: Every day | ORAL | 1 refills | Status: DC

## 2018-04-26 MED FILL — CLEAR-ATADINE 10 MG TABLET: 10 | 30 days supply | Qty: 30 | Fill #0

## 2018-04-26 MED FILL — PANTOPRAZOLE SOD DR 40 MG T: 40 | 30 days supply | Qty: 60 | Fill #0

## 2018-04-26 NOTE — Telephone Encounter (Signed)
Called pt re adding rides for 10/25 and 10/28. Confirmed rides with patient.

## 2018-04-28 ENCOUNTER — Inpatient Hospital Stay: Payer: Medicaid Other | Attending: Obstetrics and Gynecology

## 2018-04-28 ENCOUNTER — Encounter (HOSPITAL_COMMUNITY): Payer: Self-pay

## 2018-04-28 ENCOUNTER — Telehealth: Payer: Self-pay | Admitting: *Deleted

## 2018-04-28 ENCOUNTER — Other Ambulatory Visit: Payer: Self-pay | Admitting: Radiation Therapy

## 2018-04-28 ENCOUNTER — Telehealth: Payer: Self-pay

## 2018-04-28 ENCOUNTER — Ambulatory Visit (HOSPITAL_COMMUNITY)
Admission: RE | Admit: 2018-04-28 | Discharge: 2018-04-28 | Disposition: A | Discharge status: Home / Self Care | Payer: Medicaid Other | Source: Ambulatory Visit | Attending: Oncology | Admitting: Oncology

## 2018-04-28 ENCOUNTER — Telehealth: Payer: Self-pay | Admitting: Radiation Therapy

## 2018-04-28 DIAGNOSIS — C786 Secondary malignant neoplasm of retroperitoneum and peritoneum: Secondary | ICD-10-CM | POA: Diagnosis not present

## 2018-04-28 DIAGNOSIS — C439 Malignant melanoma of skin, unspecified: Secondary | ICD-10-CM

## 2018-04-28 DIAGNOSIS — Z79899 Other long term (current) drug therapy: Secondary | ICD-10-CM | POA: Insufficient documentation

## 2018-04-28 DIAGNOSIS — C7800 Secondary malignant neoplasm of unspecified lung: Secondary | ICD-10-CM | POA: Insufficient documentation

## 2018-04-28 DIAGNOSIS — C7931 Secondary malignant neoplasm of brain: Secondary | ICD-10-CM | POA: Insufficient documentation

## 2018-04-28 DIAGNOSIS — C7940 Secondary malignant neoplasm of unspecified part of nervous system: Secondary | ICD-10-CM | POA: Insufficient documentation

## 2018-04-28 DIAGNOSIS — C7949 Secondary malignant neoplasm of other parts of nervous system: Principal | ICD-10-CM

## 2018-04-28 LAB — CBC WITH DIFFERENTIAL (CANCER CENTER ONLY)
Abs Immature Granulocytes: 0.01 10*3/uL (ref 0.00–0.07)
Basophils Absolute: 0 10*3/uL (ref 0.0–0.1)
Basophils Relative: 0 %
EOS ABS: 0 10*3/uL (ref 0.0–0.5)
EOS PCT: 1 %
HEMATOCRIT: 33.5 % — AB (ref 36.0–46.0)
Hemoglobin: 9.8 g/dL — ABNORMAL LOW (ref 12.0–15.0)
Immature Granulocytes: 0 %
LYMPHS ABS: 1.4 10*3/uL (ref 0.7–4.0)
Lymphocytes Relative: 33 %
MCH: 22.5 pg — AB (ref 26.0–34.0)
MCHC: 29.3 g/dL — AB (ref 30.0–36.0)
MCV: 76.8 fL — AB (ref 80.0–100.0)
MONO ABS: 0.5 10*3/uL (ref 0.1–1.0)
MONOS PCT: 12 %
Neutro Abs: 2.4 10*3/uL (ref 1.7–7.7)
Neutrophils Relative %: 54 %
Platelet Count: 102 10*3/uL — ABNORMAL LOW (ref 150–400)
RBC: 4.36 MIL/uL (ref 3.87–5.11)
RDW: 15.9 % — AB (ref 11.5–15.5)
WBC Count: 4.4 10*3/uL (ref 4.0–10.5)
nRBC: 0 % (ref 0.0–0.2)

## 2018-04-28 LAB — CMP (CANCER CENTER ONLY)
ALK PHOS: 64 U/L (ref 38–126)
ALT: 8 U/L (ref 0–44)
AST: 15 U/L (ref 15–41)
Albumin: 3.6 g/dL (ref 3.5–5.0)
Anion gap: 9 (ref 5–15)
BILIRUBIN TOTAL: 0.3 mg/dL (ref 0.3–1.2)
BUN: 15 mg/dL (ref 6–20)
CALCIUM: 9 mg/dL (ref 8.9–10.3)
CO2: 25 mmol/L (ref 22–32)
CREATININE: 0.76 mg/dL (ref 0.44–1.00)
Chloride: 108 mmol/L (ref 98–111)
GFR, Est AFR Am: >60 mL/min (ref >60)
GFR, Estimated: >60 mL/min (ref >60)
GLUCOSE: 126 mg/dL — AB (ref 70–99)
Potassium: 4 mmol/L (ref 3.5–5.1)
SODIUM: 142 mmol/L (ref 135–145)
TOTAL PROTEIN: 6.4 g/dL — AB (ref 6.5–8.1)

## 2018-04-28 MED ORDER — SODIUM CHLORIDE 0.9 % IJ SOLN
INTRAMUSCULAR | Status: AC
  Filled 2018-04-28: qty 50

## 2018-04-28 MED ORDER — IOHEXOL 300 MG/ML  SOLN
100.0000 mL | Freq: Once | INTRAMUSCULAR | Status: AC | PRN
  Administered 2018-04-28: 100 mL via INTRAVENOUS

## 2018-04-28 NOTE — Telephone Encounter (Signed)
Left voicemail about upcoming brain MRI and follow-up appointment with Kaitlyn Duncan in November.   Mont Dutton R.T.(R)(T) Special Procedures Navigator

## 2018-04-28 NOTE — Telephone Encounter (Signed)
Pt here today for labs and to pick up her medications from our pharmacy. These were given to her.

## 2018-04-28 NOTE — Telephone Encounter (Signed)
Contacted patient regarding [name] and Mektovi are ready for pick up. Patient picked up medication on 10/25.

## 2018-05-01 ENCOUNTER — Inpatient Hospital Stay (HOSPITAL_BASED_OUTPATIENT_CLINIC_OR_DEPARTMENT_OTHER): Payer: Medicaid Other | Admitting: Oncology

## 2018-05-01 ENCOUNTER — Telehealth: Payer: Self-pay | Admitting: Oncology

## 2018-05-01 VITALS — BP 108/75 | HR 80 | Temp 97.7°F | Resp 14 | Ht 60.0 in | Wt 149.7 lb

## 2018-05-01 DIAGNOSIS — C786 Secondary malignant neoplasm of retroperitoneum and peritoneum: Secondary | ICD-10-CM

## 2018-05-01 DIAGNOSIS — C439 Malignant melanoma of skin, unspecified: Secondary | ICD-10-CM | POA: Diagnosis not present

## 2018-05-01 DIAGNOSIS — C7931 Secondary malignant neoplasm of brain: Secondary | ICD-10-CM | POA: Diagnosis not present

## 2018-05-01 DIAGNOSIS — C7800 Secondary malignant neoplasm of unspecified lung: Secondary | ICD-10-CM

## 2018-05-01 NOTE — Telephone Encounter (Signed)
Gave pt avs and calendar  °

## 2018-05-01 NOTE — Progress Notes (Signed)
Hematology and Oncology Follow Up Visit  Kaitlyn Duncan [id number] [birth year] 47 y.o. 05/01/2018 9:11 AM [name], [name], NPPlacey, [name], NP   Principle Diagnosis: 47 year old woman with BRAF positive, stage IV melanoma diagnosed in July 2019.  She presented with subcutaneous nodules, peritoneal involvement, lung metastasis, CNS metastasis and adenopathy without unknown primary.   Prior Therapy: He is status post subcutaneous nodule biopsy on 01/27/2018 which confirmed the presence of metastatic melanoma. She status post endoscopy on 01/28/2018 which showed subcutaneous involvement that consistent with metastatic melanoma.  Whole brain radiation for total of 30 Gy in 10 fractions started on 02/07/2018.  Current therapy:  Mektovi 45 mg bid, [name] 450 mg daily started in August 2019.    Interim History: [name] returns today for repeat evaluation.  Since her last visit, she reports no major changes or complaints.  She continues to tolerate Mektovi 45 mg bid and  [name] 450 mg daily without any new complications.  She reports some mild nausea and dyspepsia and some mild fatigue.  She denies any worsening headaches or neurological deficits.  She denies any skin rashes or lesions.  Her performance status and quality of life remains maintained.  She does report decrease in her adenopathy and subcutaneous nodules.   She does not report any headaches, blurry vision.  She denies any lethargy or confusion.  Does not report any fevers, chills or sweats.  Does not report any cough, wheezing or hemoptysis.  Does not report any chest pain, palpitation, orthopnea or leg edema.  Does not report any nausea, vomiting or change in bowel habits. Does not report any  hematochezia or melena. Does not report any bone pain or pathological fractures.  Does not report frequency, urgency or hematuria.  Does not report any skin rashes or lesions.  Does not report any bleeding or clotting tendency.  Does  not report any anxiety or depression.  Remaining review of systems is negative.    Medications: I have reviewed the patient's current medications.  Current Outpatient Medications  Medication Sig Dispense Refill  . acetaminophen (TYLENOL) 325 MG tablet Take 650 mg by mouth every 6 (six) hours as needed.    . Binimetinib (MEKTOVI) 15 MG TABS Take 45 mg by mouth 2 (two) times daily. 180 tablet 0  . Encorafenib ([name]) 75 MG CAPS Take 450 mg by mouth daily. 180 capsule 0  . levETIRAcetam (KEPPRA) 1000 MG tablet Take 1 tablet (1,000 mg total) by mouth 2 (two) times daily. 60 tablet 2  . loratadine (CLARITIN) 10 MG tablet Take 1 tablet (10 mg total) by mouth daily. 30 tablet 1  . magic mouthwash SOLN Take 5 mLs by mouth 4 (four) times daily as needed for mouth pain. 250 mL 2  . Multiple Vitamin (MULTIVITAMIN WITH MINERALS) TABS tablet Take 1 tablet by mouth daily.    . mupirocin ointment (BACTROBAN) 2 % Place 1 application into the nose 2 (two) times daily. 30 g 1  . ondansetron (ZOFRAN) 8 MG tablet Take 1 tablet (8 mg total) by mouth every 8 (eight) hours as needed for nausea or vomiting. (Patient not taking: Reported on 03/30/2018) 20 tablet 0  . oxybutynin (DITROPAN) 5 MG tablet Take 1 tablet (5 mg total) by mouth 3 (three) times daily. 90 tablet 5  . pantoprazole (PROTONIX) 40 MG tablet Take 1 tablet (40 mg total) by mouth 2 (two) times daily. 60 tablet 1   No current facility-administered medications for this visit.  Allergies: No Known Allergies  Past Medical History, Surgical history, Social history, and Family History were reviewed and updated.  Review of Systems:    Physical Exam:  Blood pressure 108/75, pulse 80, temperature 97.7 F (36.5 C), temperature source Oral, resp. rate 14, height 5' (1.524 m), weight 149 lb 11.2 oz (67.9 kg), last menstrual period 04/22/2018, SpO2 100 %.    ECOG: 1   General appearance: Alert, awake without any distress. Head: Atraumatic  without abnormalities Oropharynx: Without any thrush or ulcers. Eyes: No scleral icterus. Lymph nodes: No lymphadenopathy noted in the cervical, supraclavicular regions.  Decreased axillary lymphadenopathy bilaterally. Heart:regular rate and rhythm, without any murmurs or gallops.   Lung: Clear to auscultation without any rhonchi, wheezes or dullness to percussion. Abdomin: Soft, nontender without any shifting dullness or ascites. Musculoskeletal: No clubbing or cyanosis. Neurological: No motor or sensory deficits. Skin: No rashes or lesions. Psychiatric: Mood and affect appeared normal.     Lab Results: Lab Results  Component Value Date   WBC 4.4 04/28/2018   HGB 9.8 (L) 04/28/2018   HCT 33.5 (L) 04/28/2018   MCV 76.8 (L) 04/28/2018   PLT 102 (L) 04/28/2018     Chemistry      Component Value Date/Time   NA 142 04/28/2018 1041   K 4.0 04/28/2018 1041   CL 108 04/28/2018 1041   CO2 25 04/28/2018 1041   BUN 15 04/28/2018 1041   CREATININE 0.76 04/28/2018 1041      Component Value Date/Time   CALCIUM 9.0 04/28/2018 1041   ALKPHOS 64 04/28/2018 1041   AST 15 04/28/2018 1041   ALT 8 04/28/2018 1041   BILITOT 0.3 04/28/2018 1041     EXAM: CT CHEST, ABDOMEN, AND PELVIS WITH CONTRAST  TECHNIQUE: Multidetector CT imaging of the chest, abdomen and pelvis was performed following the standard protocol during bolus administration of intravenous contrast.  CONTRAST:  131m OMNIPAQUE IOHEXOL 300 MG/ML  SOLN  COMPARISON:  01/26/2018.  FINDINGS: CT CHEST FINDINGS  Cardiovascular: Vascular structures are unremarkable. Heart is enlarged. No pericardial effusion.  Mediastinum/Nodes: Prevascular lymph node measures 7 mm, previously 10 mm. No hilar adenopathy. Axillary lymph nodes have decreased in size and number. Index lymph node on the right measures 6 mm (series 2, image 17), previously 9 mm. Esophagus is grossly unremarkable.  Lungs/Pleura: Pulmonary nodules  have decreased in size and number. A residual index nodule in the anterior segment right upper lobe measures 7 x 7 mm (series 4, image 62), previously 10 x 15 mm. No pleural fluid. Airway is unremarkable.  Musculoskeletal: There may be slight increased sclerosis within the T7 vertebral body. Probable T12 hemangioma. Marked interval decrease in numerous nodules within the subcutaneous fat. A residual index nodule in the medial aspect of the lower left breast measures 4 x 7 mm (image 33), previously 7 x 11 mm.  CT ABDOMEN PELVIS FINDINGS  Hepatobiliary: Millimetric low-attenuation lesions in the dome of the liver (images 38 and 41) are too small to characterize. Liver is otherwise unremarkable. High density structure within the gallbladder fundus measures 1.8 cm and is presumably a stone. No biliary ductal dilatation.  Pancreas: Negative.  Spleen: 10 mm hyperdense lesion in the spleen is unchanged (image 54) and difficult to further characterize. There may be a subtle heterogeneous hypodense lesion in the inferior aspect of the spleen measuring 12 mm (image 57).  Adrenals/Urinary Tract: Bilateral adrenal masses have decreased in size in the interval. Index right adrenal mass measures 1.8 x  2.9 cm, previously 1.9 x 3.5 cm. Index left adrenal mass measures 2.3 x 3.0 cm, previously 2.7 x 3.4 cm. Kidneys are unremarkable. Duplicated right renal collecting system which appears to join in the midportion of the right ureter. Ureters are decompressed. Bladder is low in volume.  Stomach/Bowel: Within the stomach, it is difficult to differentiate food material from soft tissue lesions, particularly within the cardiac and antral portions. Small bowel, appendix and colon are unremarkable.  Vascular/Lymphatic: Vascular structures are unremarkable. Abdominal peritoneal ligament and retroperitoneal lymph nodes are subcentimeter in short axis size.  Reproductive: Hypodense lesions  in the uterus measure up to 4.3 cm, as before, and are likely fibroids. 4.7 cm lesion in the right ovary measures 30 Hounsfield units and is unchanged.  Other: No free fluid. Overall improvement in omental/peritoneal and retroperitoneal nodularity with a residual 3 mm left lower quadrant omental nodule (image 86), previously 5 mm.  Musculoskeletal: Sclerotic lesion in the right sacrum is again seen with surrounding mottled lucency. Trabecular sclerosis in both acetabular roofs, as before. Size and number of soft tissue nodules in the subcutaneous fat have improved. Index nodule overlying the lower right anterior ribs measures 0.6 x 2.2 cm (image 42), previously 1.3 x 2.4 cm.  IMPRESSION: 1. Interval response to therapy as evidenced by decrease in size and/or number of mediastinal/axillary lymph nodes, pulmonary nodules, bilateral adrenal masses, peritoneal nodules and subcutaneous nodules. 2. Areas of vague sclerosis within the spine and pelvis, stable. Difficult to exclude metastatic disease. 3. Lesions in the liver and spleen, too small to characterize. Continued attention on follow-up exams is warranted. 4. Probable gallstone. Imaging appearance makes it difficult to definitively exclude a metastatic lesion. 5. Mildly hypodense right ovarian lesion, stable. If further evaluation is desired, pelvic ultrasound is recommended.    Impression and Plan:   47 year old woman with:  1.  Stage IV melanoma presented with diffuse metastasis including pulmonary, lymphadenopathy and peritoneal areas.  Her tumor is BRAF positive  She continues to tolerate Mektovi 45 mg bid and [name] 450 mg daily without any recent issues or complaints.  She has difficulty swallowing some of these pills at times but otherwise manageable at this time.  CT scan obtained on 04/28/2018 was personally reviewed and showed positive response to therapy.  Risks and benefits of continuing this therapy for the  immediate future versus switching to immunotherapy was discussed.  After discussion today, the plan is to continue this treatment for at least 1 to 2 months and repeat imaging studies and consider switching to immunotherapy at that time.   2.  CNS metastasis: She will repeat MRI in the future for surveillance purposes.  No neurological deficits.  3.  Prognosis: Her disease is incurable and that was reiterated today.  Therapy remains palliative.  Her performance status is excellent and aggressive therapy is recommended.  4.  Psychosocial consideration: Social situation remains manageable at this time.  She is getting the appropriate help that she needs..  5.  Cardiac monitoring: Her baseline cardiac function is normal and an echo will be repeated again in November 2019.  6.  Follow-up: We will be in 4 weeks to follow her progress.  25  minutes was spent with the patient face-to-face today.  More than 50% of time was dedicated to reviewing her disease process, imaging studies and coordinating plan of care.    Zola Button, MD 10/28/20199:11 AM

## 2018-05-05 ENCOUNTER — Telehealth: Payer: Self-pay

## 2018-05-05 ENCOUNTER — Other Ambulatory Visit: Payer: Self-pay | Admitting: Urology

## 2018-05-05 MED FILL — levETIRAcetam 1000 MG TABS: 1000 | 30 days supply | Qty: 60 | Fill #0

## 2018-05-05 NOTE — Telephone Encounter (Signed)
Received call from patient stating that is she is to remain on Keppra then she will need a refill today. Informed the patient that the Keppra was filled by Ashlyn Bruning in radiation so contacted Aldona Bar RN in radiation and provided information and contact and Aldona Bar will call back. Patient also requesting a referral to dietician as she has difficulty finding food that tastes good and would like recommendations/recipes/recommendations. A message was sent to [facility] dietician.

## 2018-05-08 ENCOUNTER — Telehealth: Payer: Self-pay | Admitting: Radiation Oncology

## 2018-05-08 NOTE — Telephone Encounter (Signed)
Phoned patient. Explained that [employer], PA-C refill her Keppra. Encouraged her to wait for one hour before heading to [place] to pick it up. She verbalized understanding and expressed appreciation for the call.

## 2018-05-09 ENCOUNTER — Inpatient Hospital Stay: Payer: Medicaid Other | Attending: Obstetrics and Gynecology

## 2018-05-09 DIAGNOSIS — J320 Chronic maxillary sinusitis: Secondary | ICD-10-CM | POA: Insufficient documentation

## 2018-05-09 DIAGNOSIS — K219 Gastro-esophageal reflux disease without esophagitis: Secondary | ICD-10-CM | POA: Insufficient documentation

## 2018-05-09 DIAGNOSIS — C7989 Secondary malignant neoplasm of other specified sites: Secondary | ICD-10-CM | POA: Insufficient documentation

## 2018-05-09 DIAGNOSIS — C7931 Secondary malignant neoplasm of brain: Secondary | ICD-10-CM | POA: Insufficient documentation

## 2018-05-09 DIAGNOSIS — E119 Type 2 diabetes mellitus without complications: Secondary | ICD-10-CM | POA: Insufficient documentation

## 2018-05-09 DIAGNOSIS — R1110 Vomiting, unspecified: Secondary | ICD-10-CM | POA: Insufficient documentation

## 2018-05-09 DIAGNOSIS — C786 Secondary malignant neoplasm of retroperitoneum and peritoneum: Secondary | ICD-10-CM | POA: Insufficient documentation

## 2018-05-09 DIAGNOSIS — C7800 Secondary malignant neoplasm of unspecified lung: Secondary | ICD-10-CM | POA: Insufficient documentation

## 2018-05-09 DIAGNOSIS — C439 Malignant melanoma of skin, unspecified: Secondary | ICD-10-CM | POA: Insufficient documentation

## 2018-05-09 DIAGNOSIS — Z79899 Other long term (current) drug therapy: Secondary | ICD-10-CM | POA: Insufficient documentation

## 2018-05-09 MED FILL — MAGIC MW (NYS,BEN,MAAL): 12 days supply | Qty: 240 | Fill #2

## 2018-05-09 NOTE — Progress Notes (Signed)
Kaitlyn Duncan 47 y.o. woman with over 72 brain metastases fromstage IVmelanoma completed radiation 02-17-18 review 05-11-18 MRI brain w wo contrast.   Headache:Yes Pain:Right knee and lower back 4/10 not taking any pain medication Dizziness:No Nausea/vomiting:Vomiting this morning Ringing in ears:No,right side inner ear problem Visual changes (Blurred/ diplopia double vision,blind spots, and peripheral vsion changes):No Fine motor movement:picking up objects with fingers,holding objects, writing, weakness of lower extremities:No Fatigue:Yes for the past few days Cognitive changes: Alert and oriented x 3 with fluent speech,able to complete sentences without difficulty with word finding or organization of sentences. Wt Readings from Last 3 Encounters:  05/16/18 150 lb 9.6 oz (68.3 kg)  05/01/18 149 lb 11.2 oz (67.9 kg)  04/03/18 153 lb 1.6 oz (69.4 kg)  BP 96/60 (BP Location: Right Arm, Patient Position: Sitting)   Pulse (!) 112   Temp 98.4 F (36.9 C) (Oral)   Ht (!) 5" (0.127 m)   Wt 150 lb 9.6 oz (68.3 kg)   LMP 04/22/2018 (Exact Date)   SpO2 100%   BMI 4235.33 kg/m

## 2018-05-09 NOTE — Progress Notes (Signed)
Nutrition Assessment  Patient interested in recommendations/recipes as foods do not taste good.    47 year old female with stage IV melanoma.  Patient taking mektovi and barftovi.  Patient has completed whole brain radiation.    Met with patient in clinic this pm.  Patient reports that food does not taste the same (ie spaghetti sauces).  She has found that balsamic vinegar she can taste well.  Reports that food is not very appealing  and at times does not know what to fix to eat.  Reports that she is eating less because of foods lack of taste    Medications: MVI, keppra, zofran, protonix  Labs: glucose 126 (10/25)  Anthropometrics:   Height: 60 inches Weight: 149 lb 11.2 oz (10/28) Noted weight of 168 lb 02/06/2018 BMI: 29  11% weight loss in 3 months, significant   Estimated Energy Needs  Kcals: 1700-2000 calories Protein: 85-100 g  Fluid: 2 L/d  NUTRITION DIAGNOSIS: Inadequate oral intake related to taste change from treatment as evidenced by 11% weight loss in the last 3 months and decreased appetite   INTERVENTION:  Discussed strategies to help with taste changes.  Fact sheet given Discussed strategies to increase calories and protein. Fact sheet given. Discussed oral nutrition supplements and gave samples today.  Provided resources for recipes for patient as well.   Contact information provided to patient    MONITORING, EVALUATION, GOAL: weight trends, intake   NEXT VISIT: as needed.  Patient declined follow-up visit at this time.  Patient to contact as needed  Kaitlyn Duncan, [place], [place] Registered Dietitian [phone] (pager)

## 2018-05-10 ENCOUNTER — Encounter: Payer: Self-pay | Admitting: General Practice

## 2018-05-10 ENCOUNTER — Other Ambulatory Visit: Payer: Self-pay | Admitting: Radiation Therapy

## 2018-05-10 NOTE — Progress Notes (Signed)
[facility] agreeable to provide patient a ride to MRI on 11/7 at 3:40 at [place] ([place]  Provider uses Merri Brunette for these rides and will reach out to patient by phone to explain program and discuss guidelines and availability of program.  Edwyna Shell, LCSW Clinical Social Worker Phone:  [phone]

## 2018-05-10 NOTE — Progress Notes (Signed)
[place] CSW Progress Note  Request received from [facility] to help w transport to MRI appt tomorrow.  Patient has several options other than [place] transportation which cannot assist w off campus appointments.  P\Of note, patient has Medicaid transport and can schedule rides by calling [phone].  She can also enroll in [employer] which has on demand transportation - [place] CSWs can assist patient w submitting this application. Patient then needs to attend in person interview at [facility] office; however, she can schedule rides upon submission of application.  In general, patient must schedule rides with 3 - 5 days notice as all services are not "on demand."  For ride tomorrow 11/7, [facility] was willing to schedule ride despite short notice.  Deatra Canter, [facility] Solicitor, will discuss this ride option w patient.  CSW scheduled [facility] ride for patient.  Edwyna Shell, LCSW Clinical Social Worker Phone:  [phone]

## 2018-05-11 ENCOUNTER — Ambulatory Visit
Admission: RE | Admit: 2018-05-11 | Discharge: 2018-05-11 | Disposition: A | Discharge status: Home / Self Care | Payer: Medicaid Other | Source: Ambulatory Visit | Attending: Radiation Oncology | Admitting: Radiation Oncology

## 2018-05-11 DIAGNOSIS — C7949 Secondary malignant neoplasm of other parts of nervous system: Principal | ICD-10-CM

## 2018-05-11 DIAGNOSIS — C7931 Secondary malignant neoplasm of brain: Secondary | ICD-10-CM

## 2018-05-11 MED ORDER — GADOBENATE DIMEGLUMINE 529 MG/ML IV SOLN
14.0000 mL | Freq: Once | INTRAVENOUS | Status: AC | PRN
  Administered 2018-05-11: 14 mL via INTRAVENOUS

## 2018-05-15 ENCOUNTER — Other Ambulatory Visit: Payer: Self-pay | Admitting: Oncology

## 2018-05-15 ENCOUNTER — Inpatient Hospital Stay: Payer: Medicaid Other

## 2018-05-16 ENCOUNTER — Other Ambulatory Visit: Payer: Self-pay

## 2018-05-16 ENCOUNTER — Encounter: Payer: Self-pay | Admitting: Urology

## 2018-05-16 ENCOUNTER — Ambulatory Visit
Admission: RE | Admit: 2018-05-16 | Discharge: 2018-05-16 | Disposition: A | Discharge status: Home / Self Care | Payer: Medicaid Other | Source: Ambulatory Visit | Attending: Urology | Admitting: Urology

## 2018-05-16 ENCOUNTER — Other Ambulatory Visit: Payer: Self-pay | Admitting: Urology

## 2018-05-16 VITALS — BP 96/60 | HR 112 | Temp 98.4°F | Ht <= 58 in | Wt 150.6 lb

## 2018-05-16 DIAGNOSIS — C7931 Secondary malignant neoplasm of brain: Secondary | ICD-10-CM | POA: Insufficient documentation

## 2018-05-16 DIAGNOSIS — K219 Gastro-esophageal reflux disease without esophagitis: Secondary | ICD-10-CM | POA: Diagnosis not present

## 2018-05-16 DIAGNOSIS — Z79899 Other long term (current) drug therapy: Secondary | ICD-10-CM | POA: Insufficient documentation

## 2018-05-16 DIAGNOSIS — R351 Nocturia: Secondary | ICD-10-CM | POA: Diagnosis not present

## 2018-05-16 MED ORDER — OXYBUTYNIN CHLORIDE ER 10 MG PO TB24: 10.0000 mg | ORAL_TABLET | Freq: Every day | ORAL | 5 refills | Status: DC

## 2018-05-16 MED FILL — OXYBUTYNIN CL ER 10 MG TAB: 10 | 30 days supply | Qty: 30 | Fill #0

## 2018-05-16 NOTE — Progress Notes (Signed)
Radiation Oncology         [phone]) [phone] ________________________________  Name: Kaitlyn Duncan MRN: [id number]  Date: 05/16/2018  DOB: [birth year]  Post Treatment Note  CC: No primary care provider on file.  [name], Kaitlyn Muscat, NP  Diagnosis:   47 yo woman with over 50 brain metastases fromstage IVmelanoma  Interval Since Last Radiation:  3 months 02/06/2018 to 02/17/2018:   The whole brain was treated to 30 Gy in 10 fractions of 3 Gy  Narrative:  The patient returns today for routine follow-up. She tolerated radiation treatment relatively well. She had a recent follow up MRI brain on 05/11/18 which shows a good response to treatment with decrease in size of punctate enhancing lesions throughout both cerebral hemispheres and no new lesions noted. Her cas was reviewed in the multidisciplinary brain conference on 05/15/18 and she is here to review the results and recommendations.  On review of systems, the patient states that she is doing well overall.  She has not had any significant headaches, tinnitus, dizziness/imbalance, tremor or seizure activity.  She denies chest pain, shortness of breath, wheezing, cough or hemoptysis.  She has continued taking Keppra as prescribed.  She reports a decent appetite and is maintaining her weight but admits to increased gas/bloating and acid reflux symptoms over the past 1-2 weeks.  She denies any nausea but will occasionally vomit due to the increased gas pressure and acid.  She denies constipation or diarrhea.  She has increased nocturia over the past week, despite continuing Oxybutynin TID, but denies dysuria, gross hematuria, increased daytime frequency, urgency or incomplete emptying. She has continued taking systemic chemotherapy under the care and direction of Dr. Alen Blew with Mektovi 45 mg bid and [name] 450 mg daily- started on March 02, 2018.  She feels that she is tolerating these medications well.  She was last seen by Dr. Alen Blew on 05/01/2018  and recent systemic imaging showed a positive response to treatment so the plan at that time was to continue with her current systemic chemo regimen for a couple more months prior to obtaining restaging scans.  Consideration will be given at that time regarding potentially switching to immunotherapy.  She has noted significant improvement, in fact, almost resolution of the palpable scalp lesions which she is quite pleased with. She has mild itching and dryness of her scalp.  ALLERGIES:  has No Known Allergies.  Meds: Current Outpatient Medications  Medication Sig Dispense Refill  . acetaminophen (TYLENOL) 325 MG tablet Take 650 mg by mouth every 6 (six) hours as needed.    . levETIRAcetam (KEPPRA) 1000 MG tablet TAKE 1 TABLET (1,000 MG TOTAL) BY MOUTH 2 (TWO) TIMES DAILY. 60 tablet 2  . loratadine (CLARITIN) 10 MG tablet Take 1 tablet (10 mg total) by mouth daily. 30 tablet 1  . magic mouthwash SOLN Take 5 mLs by mouth 4 (four) times daily as needed for mouth pain. 250 mL 2  . mupirocin ointment (BACTROBAN) 2 % Place 1 application into the nose 2 (two) times daily. 30 g 1  . pantoprazole (PROTONIX) 40 MG tablet Take 1 tablet (40 mg total) by mouth 2 (two) times daily. 60 tablet 1  . [name] 75 MG CAPS TAKE 6 CAPSULES (450 MG) BY MOUTH ONCE DAILY 180 capsule 0  . MEKTOVI 15 MG TABS TAKE 3 TABLETS (45 MG) BY MOUTH TWICE A DAY 180 tablet 0  . Multiple Vitamin (MULTIVITAMIN WITH MINERALS) TABS tablet Take 1 tablet by mouth daily.    Marland Kitchen  ondansetron (ZOFRAN) 8 MG tablet Take 1 tablet (8 mg total) by mouth every 8 (eight) hours as needed for nausea or vomiting. (Patient not taking: Reported on 03/30/2018) 20 tablet 0  . oxybutynin (DITROPAN XL) 10 MG 24 hr tablet Take 1 tablet (10 mg total) by mouth at bedtime. 30 tablet 5   No current facility-administered medications for this encounter.     Physical Findings:  height is 5" (0.127 m) (abnormal) and weight is 150 lb 9.6 oz (68.3 kg). Her oral  temperature is 98.4 F (36.9 C). Her blood pressure is 96/60 and her pulse is 112 (abnormal). Her oxygen saturation is 100%.  Pain Assessment Pain Score: 4  Pain Loc: Back(Right knee 4)/10 In general this is a well appearing Caucasian female in no acute distress. She's alert and oriented x4 and appropriate throughout the examination. Cardiopulmonary assessment is negative for acute distress and She exhibits normal effort. She appears grossly neurologically intact and is ambulating with steady gait without the assistance of a walker or cane. EOMs intact bilaterally and PERRLA. Patchy alopecia is noted with only a very few visible/palpable scalp lesions remaining.  Scalp is non tender to palpation and no evidence of skin irritation or desquamation. Sensation is intact to light touch and strength is 5/5 and equal in bilateral upper and lower extremities.   Lab Findings: Lab Results  Component Value Date   WBC 4.4 04/28/2018   HGB 9.8 (L) 04/28/2018   HCT 33.5 (L) 04/28/2018   MCV 76.8 (L) 04/28/2018   PLT 102 (L) 04/28/2018     Radiographic Findings: Ct Chest W Contrast  Result Date: 05/01/2018 CLINICAL DATA:  Metastatic melanoma, subcutaneous nodules, peritoneal involvement, lung metastases, GI involvement, brain metastasis. Cough, shortness of breath and weight loss. Nausea and diarrhea. EXAM: CT CHEST, ABDOMEN, AND PELVIS WITH CONTRAST TECHNIQUE: Multidetector CT imaging of the chest, abdomen and pelvis was performed following the standard protocol during bolus administration of intravenous contrast. CONTRAST:  11mL OMNIPAQUE IOHEXOL 300 MG/ML  SOLN COMPARISON:  01/26/2018. FINDINGS: CT CHEST FINDINGS Cardiovascular: Vascular structures are unremarkable. Heart is enlarged. No pericardial effusion. Mediastinum/Nodes: Prevascular lymph node measures 7 mm, previously 10 mm. No hilar adenopathy. Axillary lymph nodes have decreased in size and number. Index lymph node on the right measures 6 mm  (series 2, image 17), previously 9 mm. Esophagus is grossly unremarkable. Lungs/Pleura: Pulmonary nodules have decreased in size and number. A residual index nodule in the anterior segment right upper lobe measures 7 x 7 mm (series 4, image 62), previously 10 x 15 mm. No pleural fluid. Airway is unremarkable. Musculoskeletal: There may be slight increased sclerosis within the T7 vertebral body. Probable T12 hemangioma. Marked interval decrease in numerous nodules within the subcutaneous fat. A residual index nodule in the medial aspect of the lower left breast measures 4 x 7 mm (image 33), previously 7 x 11 mm. CT ABDOMEN PELVIS FINDINGS Hepatobiliary: Millimetric low-attenuation lesions in the dome of the liver (images 38 and 41) are too small to characterize. Liver is otherwise unremarkable. High density structure within the gallbladder fundus measures 1.8 cm and is presumably a stone. No biliary ductal dilatation. Pancreas: Negative. Spleen: 10 mm hyperdense lesion in the spleen is unchanged (image 54) and difficult to further characterize. There may be a subtle heterogeneous hypodense lesion in the inferior aspect of the spleen measuring 12 mm (image 57). Adrenals/Urinary Tract: Bilateral adrenal masses have decreased in size in the interval. Index right adrenal mass measures 1.8 x  2.9 cm, previously 1.9 x 3.5 cm. Index left adrenal mass measures 2.3 x 3.0 cm, previously 2.7 x 3.4 cm. Kidneys are unremarkable. Duplicated right renal collecting system which appears to join in the midportion of the right ureter. Ureters are decompressed. Bladder is low in volume. Stomach/Bowel: Within the stomach, it is difficult to differentiate food material from soft tissue lesions, particularly within the cardiac and antral portions. Small bowel, appendix and colon are unremarkable. Vascular/Lymphatic: Vascular structures are unremarkable. Abdominal peritoneal ligament and retroperitoneal lymph nodes are subcentimeter in short  axis size. Reproductive: Hypodense lesions in the uterus measure up to 4.3 cm, as before, and are likely fibroids. 4.7 cm lesion in the right ovary measures 30 Hounsfield units and is unchanged. Other: No free fluid. Overall improvement in omental/peritoneal and retroperitoneal nodularity with a residual 3 mm left lower quadrant omental nodule (image 86), previously 5 mm. Musculoskeletal: Sclerotic lesion in the right sacrum is again seen with surrounding mottled lucency. Trabecular sclerosis in both acetabular roofs, as before. Size and number of soft tissue nodules in the subcutaneous fat have improved. Index nodule overlying the lower right anterior ribs measures 0.6 x 2.2 cm (image 42), previously 1.3 x 2.4 cm. IMPRESSION: 1. Interval response to therapy as evidenced by decrease in size and/or number of mediastinal/axillary lymph nodes, pulmonary nodules, bilateral adrenal masses, peritoneal nodules and subcutaneous nodules. 2. Areas of vague sclerosis within the spine and pelvis, stable. Difficult to exclude metastatic disease. 3. Lesions in the liver and spleen, too small to characterize. Continued attention on follow-up exams is warranted. 4. Probable gallstone. Imaging appearance makes it difficult to definitively exclude a metastatic lesion. 5. Mildly hypodense right ovarian lesion, stable. If further evaluation is desired, pelvic ultrasound is recommended. Electronically Signed   By: Lorin Picket M.D.   On: 05/01/2018 09:20   Mr [name] TI Contrast  Result Date: 05/11/2018 CLINICAL DATA:  Stage IV melanoma. Known brain metastases. Status post whole-brain radiation. EXAM: MRI HEAD WITHOUT AND WITH CONTRAST TECHNIQUE: Multiplanar, multiecho pulse sequences of the brain and surrounding structures were obtained without and with intravenous contrast. CONTRAST:  51mL MULTIHANCE GADOBENATE DIMEGLUMINE 529 MG/ML IV SOLN COMPARISON:  MRI brain 01/28/2018. FINDINGS: Brain: Numerous enhancing metastases are  again seen throughout both cerebral hemispheres the overall number of lesions is not significantly changed. The conspicuity of individual lesions is decreased. Nearly all enhancing lesions are smaller. No larger lesions are evident. No new lesions are present. The right occipital lesion has decreased from 14 mm to 10 mm. Remote blood products are associated. Medial right thalamic lesion demonstrating blood products or more likely melanin is stable at 7 mm. A lesion in the inferior left occipital lobe is also similar in size to the prior exam. Multiple foci of susceptibility are stable. This is consistent with melena an and prior hemorrhage. The largest lesions are slightly smaller than on the prior exam. Vascular: Flow is present in the major intracranial arteries. Skull and upper cervical spine: The skull base is within normal limits. Previously noted lesion within the deep cervical fat has markedly decreased in size, now measuring 14 mm in maximal cephalo caudad dimension. Multiple scalp lesions are less well seen on today's study as well. Sinuses/Orbits: Chronic left maxillary sinus disease is again seen. The paranasal sinuses and mastoid air cells are otherwise clear. Globes and orbits are within normal limits. IMPRESSION: 1. Decrease in size of punctate enhancing lesions throughout both cerebral hemispheres. 2. Multiple foci of susceptibility bilaterally. The  largest lesions are slightly smaller than previously. This represents either prior hemorrhage or melanin. 3. No new lesions are present. 4. Chronic left maxillary sinus disease. Electronically Signed   By: San Morelle M.D.   On: 05/11/2018 20:02   Ct Abdomen Pelvis W Contrast  Result Date: 05/01/2018 CLINICAL DATA:  Metastatic melanoma, subcutaneous nodules, peritoneal involvement, lung metastases, GI involvement, brain metastasis. Cough, shortness of breath and weight loss. Nausea and diarrhea. EXAM: CT CHEST, ABDOMEN, AND PELVIS WITH  CONTRAST TECHNIQUE: Multidetector CT imaging of the chest, abdomen and pelvis was performed following the standard protocol during bolus administration of intravenous contrast. CONTRAST:  147mL OMNIPAQUE IOHEXOL 300 MG/ML  SOLN COMPARISON:  01/26/2018. FINDINGS: CT CHEST FINDINGS Cardiovascular: Vascular structures are unremarkable. Heart is enlarged. No pericardial effusion. Mediastinum/Nodes: Prevascular lymph node measures 7 mm, previously 10 mm. No hilar adenopathy. Axillary lymph nodes have decreased in size and number. Index lymph node on the right measures 6 mm (series 2, image 17), previously 9 mm. Esophagus is grossly unremarkable. Lungs/Pleura: Pulmonary nodules have decreased in size and number. A residual index nodule in the anterior segment right upper lobe measures 7 x 7 mm (series 4, image 62), previously 10 x 15 mm. No pleural fluid. Airway is unremarkable. Musculoskeletal: There may be slight increased sclerosis within the T7 vertebral body. Probable T12 hemangioma. Marked interval decrease in numerous nodules within the subcutaneous fat. A residual index nodule in the medial aspect of the lower left breast measures 4 x 7 mm (image 33), previously 7 x 11 mm. CT ABDOMEN PELVIS FINDINGS Hepatobiliary: Millimetric low-attenuation lesions in the dome of the liver (images 38 and 41) are too small to characterize. Liver is otherwise unremarkable. High density structure within the gallbladder fundus measures 1.8 cm and is presumably a stone. No biliary ductal dilatation. Pancreas: Negative. Spleen: 10 mm hyperdense lesion in the spleen is unchanged (image 54) and difficult to further characterize. There may be a subtle heterogeneous hypodense lesion in the inferior aspect of the spleen measuring 12 mm (image 57). Adrenals/Urinary Tract: Bilateral adrenal masses have decreased in size in the interval. Index right adrenal mass measures 1.8 x 2.9 cm, previously 1.9 x 3.5 cm. Index left adrenal mass measures  2.3 x 3.0 cm, previously 2.7 x 3.4 cm. Kidneys are unremarkable. Duplicated right renal collecting system which appears to join in the midportion of the right ureter. Ureters are decompressed. Bladder is low in volume. Stomach/Bowel: Within the stomach, it is difficult to differentiate food material from soft tissue lesions, particularly within the cardiac and antral portions. Small bowel, appendix and colon are unremarkable. Vascular/Lymphatic: Vascular structures are unremarkable. Abdominal peritoneal ligament and retroperitoneal lymph nodes are subcentimeter in short axis size. Reproductive: Hypodense lesions in the uterus measure up to 4.3 cm, as before, and are likely fibroids. 4.7 cm lesion in the right ovary measures 30 Hounsfield units and is unchanged. Other: No free fluid. Overall improvement in omental/peritoneal and retroperitoneal nodularity with a residual 3 mm left lower quadrant omental nodule (image 86), previously 5 mm. Musculoskeletal: Sclerotic lesion in the right sacrum is again seen with surrounding mottled lucency. Trabecular sclerosis in both acetabular roofs, as before. Size and number of soft tissue nodules in the subcutaneous fat have improved. Index nodule overlying the lower right anterior ribs measures 0.6 x 2.2 cm (image 42), previously 1.3 x 2.4 cm. IMPRESSION: 1. Interval response to therapy as evidenced by decrease in size and/or number of mediastinal/axillary lymph nodes, pulmonary nodules, bilateral adrenal  masses, peritoneal nodules and subcutaneous nodules. 2. Areas of vague sclerosis within the spine and pelvis, stable. Difficult to exclude metastatic disease. 3. Lesions in the liver and spleen, too small to characterize. Continued attention on follow-up exams is warranted. 4. Probable gallstone. Imaging appearance makes it difficult to definitively exclude a metastatic lesion. 5. Mildly hypodense right ovarian lesion, stable. If further evaluation is desired, pelvic ultrasound  is recommended. Electronically Signed   By: Lorin Picket M.D.   On: 05/01/2018 09:20    Impression/Plan: 1. 47 yo woman with over 50 brain metastases fromstage IVmelanoma. She appears to have recovered well from the effects of her recent radiotherapy.  She continues taking Keppra as prescribed.  We reviewed her recent post-treatment MRI brain which shows a good response to treatment with a decrease in size of punctate enhancing lesions throughout both cerebral hemispheres and no new lesions noted.  Her case was presented and reviewed at multidisciplinary brain conference on 05/15/18 and consensus recommendation is to repeat a  MRI brain scan in 3 months to continue to monitor her response to treatment and rule out any disease progression or recurrence.  We will continue to monitor closely with serial MRI brain scans every 3 months going forward.  She will also continue in routine follow-up with Dr. Alen Blew for continued disease management with systemic therapy under his care and direction.  She appears to have a good understanding of her disease and our recommendations and is in agreement with the stated plan.  She knows to call at any time in the interim with any questions or concerns.  2. Indigestion/Acid Reflux.  We will try adding Maalox Max at 2 teaspoons every 6 hours as needed.  She will continue taking her Protonix twice daily in hopes that this combination will get better control of her symptoms.  She has a planned follow-up visit with Dr. Alen Blew on 05/29/2018 and will readdress these issues at that time if they are persistent despite these recommendations.  3. Nocturia.  She is interested in trying a trial of oxybutynin ER to see if this will help better than the oxybutynin 3 times daily dosing.  A prescription for Oxybutynin ER 10mg  po qhs has been provided and we will monitor success at upcoming visits.     Nicholos Johns, PA-C

## 2018-05-17 ENCOUNTER — Telehealth: Payer: Self-pay

## 2018-05-17 NOTE — Telephone Encounter (Signed)
Received a call from the patient stating that during her appointment with Ashlyn Bruning yesterday she was told to take Mylanta for her heartburn and indigestion. She stated that she has had "indigestion" x2 occurrences today with vomiting. She stated that she has nasal congestion and is wondering if that can be contributing to the "yellow vomit". Spoke with Sandi Mealy PA and followed up with the patient with the instructions to continue with the Mylanta per instructions on the bottle and that she can either take Robitussin or Zyrtec for the nasal congestion. If she is not feeling better by Friday morning to then call back and we sill get her scheduled with Lucianne Lei in [facility] on Friday. Patient verbalized understanding and was agreeable with the plan.

## 2018-05-18 ENCOUNTER — Telehealth: Payer: Self-pay | Admitting: Oncology

## 2018-05-18 NOTE — Telephone Encounter (Signed)
Tried to call I did leave a message about 11/15

## 2018-05-19 ENCOUNTER — Encounter: Payer: Medicaid Other | Admitting: Medical

## 2018-05-19 ENCOUNTER — Other Ambulatory Visit: Payer: Self-pay | Admitting: Emergency Medicine

## 2018-05-19 ENCOUNTER — Telehealth: Payer: Self-pay | Admitting: Oncology

## 2018-05-19 ENCOUNTER — Inpatient Hospital Stay (HOSPITAL_BASED_OUTPATIENT_CLINIC_OR_DEPARTMENT_OTHER): Payer: Medicaid Other | Admitting: Medical

## 2018-05-19 ENCOUNTER — Inpatient Hospital Stay: Payer: Medicaid Other

## 2018-05-19 VITALS — BP 94/64 | HR 85 | Temp 98.2°F | Resp 18 | Ht 60.0 in | Wt 148.1 lb

## 2018-05-19 DIAGNOSIS — K219 Gastro-esophageal reflux disease without esophagitis: Secondary | ICD-10-CM

## 2018-05-19 DIAGNOSIS — C786 Secondary malignant neoplasm of retroperitoneum and peritoneum: Secondary | ICD-10-CM | POA: Diagnosis not present

## 2018-05-19 DIAGNOSIS — C439 Malignant melanoma of skin, unspecified: Secondary | ICD-10-CM

## 2018-05-19 DIAGNOSIS — E119 Type 2 diabetes mellitus without complications: Secondary | ICD-10-CM | POA: Diagnosis not present

## 2018-05-19 DIAGNOSIS — J320 Chronic maxillary sinusitis: Secondary | ICD-10-CM

## 2018-05-19 DIAGNOSIS — C7989 Secondary malignant neoplasm of other specified sites: Secondary | ICD-10-CM

## 2018-05-19 DIAGNOSIS — C7931 Secondary malignant neoplasm of brain: Secondary | ICD-10-CM | POA: Diagnosis not present

## 2018-05-19 DIAGNOSIS — C7800 Secondary malignant neoplasm of unspecified lung: Secondary | ICD-10-CM

## 2018-05-19 DIAGNOSIS — Z79899 Other long term (current) drug therapy: Secondary | ICD-10-CM | POA: Diagnosis not present

## 2018-05-19 DIAGNOSIS — R110 Nausea: Secondary | ICD-10-CM

## 2018-05-19 DIAGNOSIS — R1110 Vomiting, unspecified: Secondary | ICD-10-CM | POA: Diagnosis not present

## 2018-05-19 LAB — CBC WITH DIFFERENTIAL (CANCER CENTER ONLY)
ABS IMMATURE GRANULOCYTES: 0.01 10*3/uL (ref 0.00–0.07)
BASOS PCT: 0 %
Basophils Absolute: 0 10*3/uL (ref 0.0–0.1)
EOS PCT: 2 %
Eosinophils Absolute: 0.1 10*3/uL (ref 0.0–0.5)
HCT: 30.9 % — ABNORMAL LOW (ref 36.0–46.0)
HEMOGLOBIN: 9.2 g/dL — AB (ref 12.0–15.0)
Immature Granulocytes: 0 %
LYMPHS PCT: 14 %
Lymphs Abs: 0.5 10*3/uL — ABNORMAL LOW (ref 0.7–4.0)
MCH: 22 pg — AB (ref 26.0–34.0)
MCHC: 29.8 g/dL — ABNORMAL LOW (ref 30.0–36.0)
MCV: 73.7 fL — AB (ref 80.0–100.0)
MONO ABS: 0.6 10*3/uL (ref 0.1–1.0)
MONOS PCT: 16 %
NEUTROS ABS: 2.7 10*3/uL (ref 1.7–7.7)
Neutrophils Relative %: 68 %
Platelet Count: 107 10*3/uL — ABNORMAL LOW (ref 150–400)
RBC: 4.19 MIL/uL (ref 3.87–5.11)
RDW: 16.6 % — ABNORMAL HIGH (ref 11.5–15.5)
WBC: 3.9 10*3/uL — AB (ref 4.0–10.5)
nRBC: 0 % (ref 0.0–0.2)

## 2018-05-19 LAB — CMP (CANCER CENTER ONLY)
ALK PHOS: 105 U/L (ref 38–126)
ALT: 33 U/L (ref 0–44)
AST: 45 U/L — AB (ref 15–41)
Albumin: 3.2 g/dL — ABNORMAL LOW (ref 3.5–5.0)
Anion gap: 8 (ref 5–15)
BILIRUBIN TOTAL: 0.4 mg/dL (ref 0.3–1.2)
BUN: 25 mg/dL — AB (ref 6–20)
CO2: 24 mmol/L (ref 22–32)
CREATININE: 0.88 mg/dL (ref 0.44–1.00)
Calcium: 8.9 mg/dL (ref 8.9–10.3)
Chloride: 104 mmol/L (ref 98–111)
GFR, Est AFR Am: >60 mL/min (ref >60)
Glucose, Bld: 137 mg/dL — ABNORMAL HIGH (ref 70–99)
POTASSIUM: 4 mmol/L (ref 3.5–5.1)
Sodium: 136 mmol/L (ref 135–145)
TOTAL PROTEIN: 6.6 g/dL (ref 6.5–8.1)

## 2018-05-19 MED ORDER — METOCLOPRAMIDE HCL 5 MG PO TABS: 5.0000 mg | ORAL_TABLET | Freq: Four times a day (QID) | ORAL | 0 refills | Status: DC

## 2018-05-19 MED ORDER — RABEPRAZOLE SODIUM 20 MG PO TBEC: 20.0000 mg | DELAYED_RELEASE_TABLET | Freq: Every day | ORAL | 5 refills | Status: DC

## 2018-05-19 MED FILL — METOCLOPRAMIDE 5 MG TABLET: 5 | 30 days supply | Qty: 120 | Fill #0

## 2018-05-19 MED FILL — RABEPRAZOLE SOD DR 20 MG TA: 20 | 30 days supply | Qty: 30 | Fill #0

## 2018-05-19 NOTE — Progress Notes (Signed)
Pt seen by PA Van only, no RN assessment at this time.  PA aware. 

## 2018-05-19 NOTE — Telephone Encounter (Signed)
Tried to reach regarding 11/15 add on

## 2018-05-24 NOTE — Progress Notes (Signed)
[facility] Progress Note   Kaitlyn Duncan [id number] June 23, 1971 47 y.o.  Kaitlyn Duncan is managed by Dr. Alen Blew  Actively treated with chemotherapy/immunotherapy: yes  Current Therapy: [name]  Assessment: Plan:    Gastroesophageal reflux disease, esophagitis presence not specified - Plan: RABEprazole (ACIPHEX) 20 MG tablet  Nausea without vomiting - Plan: metoCLOPramide (REGLAN) 5 MG tablet   GERD: The patient will discontinue Protonix 40 mg p.o. twice daily and will be transition to AcipHex 20 mg once daily.  She has been instructed that she can increase this to twice daily dosing if it does not appear to be effective.  Nausea: Patient with patient's report of nausea and feeling as though food is sitting on her stomach she has been given a prescription for Reglan 5 mg p.o. 4 times daily.  She has been instructed to discontinue this medicine should she develop involuntary movement of facial muscles.  Metastatic melanoma: The patient continues to be followed by Dr. Alen Blew and is treated with [name].   Please see After Visit Summary for patient specific instructions.  Future Appointments  Date Time Provider [place]  05/26/2018  9:00 AM [facility] 1-[facility]  05/29/2018  8:30 AM [facility] LAB 2 [facility] None  05/29/2018  9:00 AM Shadad, Mathis Dad, MD [facility] None    No orders of the defined types were placed in this encounter.      Subjective:   Patient ID:  Kaitlyn Duncan is a 47 y.o. (DOB August 06, 1970) female.  Chief Complaint: No chief complaint on file.   HPI Kaitlyn Duncan is a 47 year old female with a history of a metastatic melanoma with multiple head metastasis, subcutaneous nodules, peritoneal involvement, lung metastasis, and gastric metastasis.  She contacted office yesterday stating that she was having indigestion despite using Mylanta and Protonix 40 mg twice daily.  She is not having  episodes of vomiting.  She also reports that she has been having some nasal congestion.  Today she states that she has been having episodes of feeling as though food is sitting on her stomach and then later vomiting with vomitus that is the color of the food that she is eaten.  She denies any constipation.  She denies fevers, chills, sweats, or diarrhea.  Medications: I have reviewed the patient's current medications.  Allergies: No Known Allergies  Past Medical History:  Diagnosis Date  . Anemia   . Anemia   . Diabetes mellitus without complication (HCC)    prediabetic  . Seasonal allergies     Past Surgical History:  Procedure Laterality Date  . BIOPSY  01/28/2018   Procedure: BIOPSY;  Surgeon: Laurence Spates, MD;  [facility];  Service: Endoscopy;;  . ESOPHAGOGASTRODUODENOSCOPY N/A 01/28/2018   Procedure: ESOPHAGOGASTRODUODENOSCOPY (EGD);  Surgeon: Laurence Spates, MD;  Location: [facility];  Service: Endoscopy;  Laterality: N/A;  . MOUTH SURGERY    . OVARY SURGERY     removed "something"    Family History  Problem Relation Age of Onset  . Hypertension Father   . Breast cancer Maternal Aunt   . Diabetes Maternal Aunt   . Breast cancer Paternal Aunt   . Breast cancer Maternal Grandmother   . Breast cancer Paternal Grandmother   . Diabetes Paternal Grandmother     Social History   Socioeconomic History  . Marital status: Single    Spouse name: Not on file  . Number of children: Not on file  . Years of education: Not  on file  . Highest education level: Not on file  Occupational History  . Not on file  Social Needs  . Financial resource strain: Not on file  . Food insecurity:    Worry: Not on file    Inability: Not on file  . Transportation needs:    Medical: Not on file    Non-medical: Not on file  Tobacco Use  . Smoking status: Never Smoker  . Smokeless tobacco: Never Used  Substance and Sexual Activity  . Alcohol use: No  . Drug use: No  .  Sexual activity: Not Currently  Lifestyle  . Physical activity:    Days per week: Not on file    Minutes per session: Not on file  . Stress: Not on file  Relationships  . Social connections:    Talks on phone: Not on file    Gets together: Not on file    Attends religious service: Not on file    Active member of club or organization: Not on file    Attends meetings of clubs or organizations: Not on file    Relationship status: Not on file  . Intimate partner violence:    Fear of current or ex partner: No    Emotionally abused: No    Physically abused: No    Forced sexual activity: No  Other Topics Concern  . Not on file  Social History Narrative  . Not on file    Past Medical History, Surgical history, Social history, and Family history were reviewed and updated as appropriate.   Please see review of systems for further details on the patient's review from today.   Review of Systems:  Review of Systems  Constitutional: Negative for appetite change, chills, diaphoresis and fever.  Respiratory: Negative for cough, choking, shortness of breath and wheezing.   Cardiovascular: Negative for chest pain and palpitations.  Gastrointestinal: Positive for nausea and vomiting. Negative for constipation and diarrhea.       GERD  Genitourinary: Negative for decreased urine volume.  Neurological: Negative for headaches.    Objective:   Physical Exam:  BP 94/64 (BP Location: Left Arm, Patient Position: Sitting)   Pulse 85   Temp 98.2 F (36.8 C) (Oral)   Resp 18   Ht 5' (1.524 m)   Wt 148 lb 1.6 oz (67.2 kg)   LMP 04/22/2018 (Exact Date)   SpO2 100%   BMI 28.92 kg/m  ECOG: 0  Physical Exam  Constitutional: No distress.  HENT:  Head: Normocephalic and atraumatic.  Cardiovascular: Normal rate, regular rhythm and normal heart sounds. Exam reveals no gallop and no friction rub.  No murmur heard. Pulmonary/Chest: Effort normal and breath sounds normal. No respiratory  distress. She has no wheezes. She has no rales.  Neurological: She is alert.  Skin: Skin is warm and dry. No rash noted. She is not diaphoretic. No erythema.    Lab Review:     Component Value Date/Time   NA 136 05/19/2018 1339   K 4.0 05/19/2018 1339   CL 104 05/19/2018 1339   CO2 24 05/19/2018 1339   GLUCOSE 137 (H) 05/19/2018 1339   BUN 25 (H) 05/19/2018 1339   CREATININE 0.88 05/19/2018 1339   CALCIUM 8.9 05/19/2018 1339   PROT 6.6 05/19/2018 1339   ALBUMIN 3.2 (L) 05/19/2018 1339   AST 45 (H) 05/19/2018 1339   ALT 33 05/19/2018 1339   ALKPHOS 105 05/19/2018 1339   BILITOT 0.4 05/19/2018 1339   GFRNONAA >60  05/19/2018 1339   GFRAA >60 05/19/2018 1339       Component Value Date/Time   WBC 3.9 (L) 05/19/2018 1339   WBC 12.1 (H) 01/29/2018 0350   RBC 4.19 05/19/2018 1339   HGB 9.2 (L) 05/19/2018 1339   HCT 30.9 (L) 05/19/2018 1339   PLT 107 (L) 05/19/2018 1339   MCV 73.7 (L) 05/19/2018 1339   MCH 22.0 (L) 05/19/2018 1339   MCHC 29.8 (L) 05/19/2018 1339   RDW 16.6 (H) 05/19/2018 1339   LYMPHSABS 0.5 (L) 05/19/2018 1339   MONOABS 0.6 05/19/2018 1339   EOSABS 0.1 05/19/2018 1339   BASOSABS 0.0 05/19/2018 1339   -------------------------------  Imaging from last 24 hours (if applicable):  Radiology interpretation: Ct Chest W Contrast  Result Date: 05/01/2018 CLINICAL DATA:  Metastatic melanoma, subcutaneous nodules, peritoneal involvement, lung metastases, GI involvement, brain metastasis. Cough, shortness of breath and weight loss. Nausea and diarrhea. EXAM: CT CHEST, ABDOMEN, AND PELVIS WITH CONTRAST TECHNIQUE: Multidetector CT imaging of the chest, abdomen and pelvis was performed following the standard protocol during bolus administration of intravenous contrast. CONTRAST:  136mL OMNIPAQUE IOHEXOL 300 MG/ML  SOLN COMPARISON:  01/26/2018. FINDINGS: CT CHEST FINDINGS Cardiovascular: Vascular structures are unremarkable. Heart is enlarged. No pericardial effusion.  Mediastinum/Nodes: Prevascular lymph node measures 7 mm, previously 10 mm. No hilar adenopathy. Axillary lymph nodes have decreased in size and number. Index lymph node on the right measures 6 mm (series 2, image 17), previously 9 mm. Esophagus is grossly unremarkable. Lungs/Pleura: Pulmonary nodules have decreased in size and number. A residual index nodule in the anterior segment right upper lobe measures 7 x 7 mm (series 4, image 62), previously 10 x 15 mm. No pleural fluid. Airway is unremarkable. Musculoskeletal: There may be slight increased sclerosis within the T7 vertebral body. Probable T12 hemangioma. Marked interval decrease in numerous nodules within the subcutaneous fat. A residual index nodule in the medial aspect of the lower left breast measures 4 x 7 mm (image 33), previously 7 x 11 mm. CT ABDOMEN PELVIS FINDINGS Hepatobiliary: Millimetric low-attenuation lesions in the dome of the liver (images 38 and 41) are too small to characterize. Liver is otherwise unremarkable. High density structure within the gallbladder fundus measures 1.8 cm and is presumably a stone. No biliary ductal dilatation. Pancreas: Negative. Spleen: 10 mm hyperdense lesion in the spleen is unchanged (image 54) and difficult to further characterize. There may be a subtle heterogeneous hypodense lesion in the inferior aspect of the spleen measuring 12 mm (image 57). Adrenals/Urinary Tract: Bilateral adrenal masses have decreased in size in the interval. Index right adrenal mass measures 1.8 x 2.9 cm, previously 1.9 x 3.5 cm. Index left adrenal mass measures 2.3 x 3.0 cm, previously 2.7 x 3.4 cm. Kidneys are unremarkable. Duplicated right renal collecting system which appears to join in the midportion of the right ureter. Ureters are decompressed. Bladder is low in volume. Stomach/Bowel: Within the stomach, it is difficult to differentiate food material from soft tissue lesions, particularly within the cardiac and antral portions.  Small bowel, appendix and colon are unremarkable. Vascular/Lymphatic: Vascular structures are unremarkable. Abdominal peritoneal ligament and retroperitoneal lymph nodes are subcentimeter in short axis size. Reproductive: Hypodense lesions in the uterus measure up to 4.3 cm, as before, and are likely fibroids. 4.7 cm lesion in the right ovary measures 30 Hounsfield units and is unchanged. Other: No free fluid. Overall improvement in omental/peritoneal and retroperitoneal nodularity with a residual 3 mm left lower quadrant omental nodule (  image 86), previously 5 mm. Musculoskeletal: Sclerotic lesion in the right sacrum is again seen with surrounding mottled lucency. Trabecular sclerosis in both acetabular roofs, as before. Size and number of soft tissue nodules in the subcutaneous fat have improved. Index nodule overlying the lower right anterior ribs measures 0.6 x 2.2 cm (image 42), previously 1.3 x 2.4 cm. IMPRESSION: 1. Interval response to therapy as evidenced by decrease in size and/or number of mediastinal/axillary lymph nodes, pulmonary nodules, bilateral adrenal masses, peritoneal nodules and subcutaneous nodules. 2. Areas of vague sclerosis within the spine and pelvis, stable. Difficult to exclude metastatic disease. 3. Lesions in the liver and spleen, too small to characterize. Continued attention on follow-up exams is warranted. 4. Probable gallstone. Imaging appearance makes it difficult to definitively exclude a metastatic lesion. 5. Mildly hypodense right ovarian lesion, stable. If further evaluation is desired, pelvic ultrasound is recommended. Electronically Signed   By: Lorin Picket M.D.   On: 05/01/2018 09:20   Mr [name] TD Contrast  Result Date: 05/11/2018 CLINICAL DATA:  Stage IV melanoma. Known brain metastases. Status post whole-brain radiation. EXAM: MRI HEAD WITHOUT AND WITH CONTRAST TECHNIQUE: Multiplanar, multiecho pulse sequences of the brain and surrounding structures were  obtained without and with intravenous contrast. CONTRAST:  81mL MULTIHANCE GADOBENATE DIMEGLUMINE 529 MG/ML IV SOLN COMPARISON:  MRI brain 01/28/2018. FINDINGS: Brain: Numerous enhancing metastases are again seen throughout both cerebral hemispheres the overall number of lesions is not significantly changed. The conspicuity of individual lesions is decreased. Nearly all enhancing lesions are smaller. No larger lesions are evident. No new lesions are present. The right occipital lesion has decreased from 14 mm to 10 mm. Remote blood products are associated. Medial right thalamic lesion demonstrating blood products or more likely melanin is stable at 7 mm. A lesion in the inferior left occipital lobe is also similar in size to the prior exam. Multiple foci of susceptibility are stable. This is consistent with melena an and prior hemorrhage. The largest lesions are slightly smaller than on the prior exam. Vascular: Flow is present in the major intracranial arteries. Skull and upper cervical spine: The skull base is within normal limits. Previously noted lesion within the deep cervical fat has markedly decreased in size, now measuring 14 mm in maximal cephalo caudad dimension. Multiple scalp lesions are less well seen on today's study as well. Sinuses/Orbits: Chronic left maxillary sinus disease is again seen. The paranasal sinuses and mastoid air cells are otherwise clear. Globes and orbits are within normal limits. IMPRESSION: 1. Decrease in size of punctate enhancing lesions throughout both cerebral hemispheres. 2. Multiple foci of susceptibility bilaterally. The largest lesions are slightly smaller than previously. This represents either prior hemorrhage or melanin. 3. No new lesions are present. 4. Chronic left maxillary sinus disease. Electronically Signed   By: San Morelle M.D.   On: 05/11/2018 20:02   Ct Abdomen Pelvis W Contrast  Result Date: 05/01/2018 CLINICAL DATA:  Metastatic melanoma,  subcutaneous nodules, peritoneal involvement, lung metastases, GI involvement, brain metastasis. Cough, shortness of breath and weight loss. Nausea and diarrhea. EXAM: CT CHEST, ABDOMEN, AND PELVIS WITH CONTRAST TECHNIQUE: Multidetector CT imaging of the chest, abdomen and pelvis was performed following the standard protocol during bolus administration of intravenous contrast. CONTRAST:  170mL OMNIPAQUE IOHEXOL 300 MG/ML  SOLN COMPARISON:  01/26/2018. FINDINGS: CT CHEST FINDINGS Cardiovascular: Vascular structures are unremarkable. Heart is enlarged. No pericardial effusion. Mediastinum/Nodes: Prevascular lymph node measures 7 mm, previously 10 mm. No hilar adenopathy. Axillary  lymph nodes have decreased in size and number. Index lymph node on the right measures 6 mm (series 2, image 17), previously 9 mm. Esophagus is grossly unremarkable. Lungs/Pleura: Pulmonary nodules have decreased in size and number. A residual index nodule in the anterior segment right upper lobe measures 7 x 7 mm (series 4, image 62), previously 10 x 15 mm. No pleural fluid. Airway is unremarkable. Musculoskeletal: There may be slight increased sclerosis within the T7 vertebral body. Probable T12 hemangioma. Marked interval decrease in numerous nodules within the subcutaneous fat. A residual index nodule in the medial aspect of the lower left breast measures 4 x 7 mm (image 33), previously 7 x 11 mm. CT ABDOMEN PELVIS FINDINGS Hepatobiliary: Millimetric low-attenuation lesions in the dome of the liver (images 38 and 41) are too small to characterize. Liver is otherwise unremarkable. High density structure within the gallbladder fundus measures 1.8 cm and is presumably a stone. No biliary ductal dilatation. Pancreas: Negative. Spleen: 10 mm hyperdense lesion in the spleen is unchanged (image 54) and difficult to further characterize. There may be a subtle heterogeneous hypodense lesion in the inferior aspect of the spleen measuring 12 mm  (image 57). Adrenals/Urinary Tract: Bilateral adrenal masses have decreased in size in the interval. Index right adrenal mass measures 1.8 x 2.9 cm, previously 1.9 x 3.5 cm. Index left adrenal mass measures 2.3 x 3.0 cm, previously 2.7 x 3.4 cm. Kidneys are unremarkable. Duplicated right renal collecting system which appears to join in the midportion of the right ureter. Ureters are decompressed. Bladder is low in volume. Stomach/Bowel: Within the stomach, it is difficult to differentiate food material from soft tissue lesions, particularly within the cardiac and antral portions. Small bowel, appendix and colon are unremarkable. Vascular/Lymphatic: Vascular structures are unremarkable. Abdominal peritoneal ligament and retroperitoneal lymph nodes are subcentimeter in short axis size. Reproductive: Hypodense lesions in the uterus measure up to 4.3 cm, as before, and are likely fibroids. 4.7 cm lesion in the right ovary measures 30 Hounsfield units and is unchanged. Other: No free fluid. Overall improvement in omental/peritoneal and retroperitoneal nodularity with a residual 3 mm left lower quadrant omental nodule (image 86), previously 5 mm. Musculoskeletal: Sclerotic lesion in the right sacrum is again seen with surrounding mottled lucency. Trabecular sclerosis in both acetabular roofs, as before. Size and number of soft tissue nodules in the subcutaneous fat have improved. Index nodule overlying the lower right anterior ribs measures 0.6 x 2.2 cm (image 42), previously 1.3 x 2.4 cm. IMPRESSION: 1. Interval response to therapy as evidenced by decrease in size and/or number of mediastinal/axillary lymph nodes, pulmonary nodules, bilateral adrenal masses, peritoneal nodules and subcutaneous nodules. 2. Areas of vague sclerosis within the spine and pelvis, stable. Difficult to exclude metastatic disease. 3. Lesions in the liver and spleen, too small to characterize. Continued attention on follow-up exams is warranted.  4. Probable gallstone. Imaging appearance makes it difficult to definitively exclude a metastatic lesion. 5. Mildly hypodense right ovarian lesion, stable. If further evaluation is desired, pelvic ultrasound is recommended. Electronically Signed   By: Lorin Picket M.D.   On: 05/01/2018 09:20

## 2018-05-25 MED FILL — SM LORATADINE 10 MG TABS: 10 | 30 days supply | Qty: 30 | Fill #1

## 2018-05-26 ENCOUNTER — Other Ambulatory Visit: Payer: Self-pay | Admitting: Oncology

## 2018-05-26 ENCOUNTER — Other Ambulatory Visit: Payer: Self-pay | Admitting: *Deleted

## 2018-05-26 ENCOUNTER — Telehealth: Payer: Self-pay

## 2018-05-26 ENCOUNTER — Ambulatory Visit (HOSPITAL_COMMUNITY)
Admission: RE | Admit: 2018-05-26 | Discharge: 2018-05-26 | Disposition: A | Discharge status: Home / Self Care | Payer: Medicaid Other | Source: Ambulatory Visit | Attending: Oncology | Admitting: Oncology

## 2018-05-26 ENCOUNTER — Telehealth: Payer: Self-pay | Admitting: Oncology

## 2018-05-26 DIAGNOSIS — C439 Malignant melanoma of skin, unspecified: Secondary | ICD-10-CM | POA: Insufficient documentation

## 2018-05-26 DIAGNOSIS — Z9221 Personal history of antineoplastic chemotherapy: Secondary | ICD-10-CM | POA: Diagnosis not present

## 2018-05-26 DIAGNOSIS — J029 Acute pharyngitis, unspecified: Secondary | ICD-10-CM

## 2018-05-26 MED ORDER — MAGIC MOUTHWASH: 5.0000 mL | Freq: Four times a day (QID) | ORAL | 2 refills | Status: DC | PRN

## 2018-05-26 MED FILL — MAGIC MW (NYS,BEN,MAAL): 12 days supply | Qty: 240 | Fill #0

## 2018-05-26 NOTE — Telephone Encounter (Signed)
Oral Oncology Patient Advocate Encounter  [name] and Kaitlyn Duncan was delivered to the office today 05/26/18. Kaitlyn Duncan came in today and I met her in the lobby and gave her the medicine.  She verbalized appreciation.  [name] [place] Phone [phone] Fax [phone]

## 2018-05-26 NOTE — Telephone Encounter (Signed)
Received VM from [facility] requesting refill of magic mouthwash for pt. Sent in-basket to Dr. Alen Blew with this information and call back number if needed [phone].

## 2018-05-26 NOTE — Telephone Encounter (Signed)
Came back into the office to two voicemails from the patient stating that she needed a ride for her echocardiogram today at [facility]. I have told the patient before that I am not able to take her to another facility even if it is ordered by her physician at the [facility]. In the past I have worked with CSW to get her set up with [facility] and [facility] to take her to other appointments outside of the [facility]. Patient was made aware two weeks ago that I could not take her to another facility due to protocol.   Patient stated that I should "Let Dr. Alen Blew and his nurses know that I will not be able to make it to my appointments today due to your shenanigans." I apologized for it being inconvenient and she hung up on me.

## 2018-05-29 ENCOUNTER — Inpatient Hospital Stay (HOSPITAL_BASED_OUTPATIENT_CLINIC_OR_DEPARTMENT_OTHER): Payer: Medicaid Other | Admitting: Oncology

## 2018-05-29 ENCOUNTER — Telehealth: Payer: Self-pay | Admitting: Oncology

## 2018-05-29 ENCOUNTER — Inpatient Hospital Stay: Payer: Medicaid Other

## 2018-05-29 DIAGNOSIS — Z7189 Other specified counseling: Secondary | ICD-10-CM | POA: Insufficient documentation

## 2018-05-29 DIAGNOSIS — C7800 Secondary malignant neoplasm of unspecified lung: Secondary | ICD-10-CM | POA: Diagnosis not present

## 2018-05-29 DIAGNOSIS — C439 Malignant melanoma of skin, unspecified: Secondary | ICD-10-CM

## 2018-05-29 DIAGNOSIS — C786 Secondary malignant neoplasm of retroperitoneum and peritoneum: Secondary | ICD-10-CM

## 2018-05-29 DIAGNOSIS — Z79899 Other long term (current) drug therapy: Secondary | ICD-10-CM

## 2018-05-29 DIAGNOSIS — C7931 Secondary malignant neoplasm of brain: Secondary | ICD-10-CM | POA: Diagnosis not present

## 2018-05-29 LAB — CBC WITH DIFFERENTIAL (CANCER CENTER ONLY)
ABS IMMATURE GRANULOCYTES: 0.02 10*3/uL (ref 0.00–0.07)
Basophils Absolute: 0 10*3/uL (ref 0.0–0.1)
Basophils Relative: 0 %
EOS PCT: 1 %
Eosinophils Absolute: 0.1 10*3/uL (ref 0.0–0.5)
HEMATOCRIT: 34.7 % — AB (ref 36.0–46.0)
HEMOGLOBIN: 10 g/dL — AB (ref 12.0–15.0)
Immature Granulocytes: 0 %
LYMPHS ABS: 1 10*3/uL (ref 0.7–4.0)
LYMPHS PCT: 21 %
MCH: 21.9 pg — ABNORMAL LOW (ref 26.0–34.0)
MCHC: 28.8 g/dL — AB (ref 30.0–36.0)
MCV: 75.9 fL — ABNORMAL LOW (ref 80.0–100.0)
Monocytes Absolute: 0.6 10*3/uL (ref 0.1–1.0)
Monocytes Relative: 12 %
NEUTROS ABS: 3 10*3/uL (ref 1.7–7.7)
NRBC: 0 % (ref 0.0–0.2)
Neutrophils Relative %: 66 %
Platelet Count: 182 10*3/uL (ref 150–400)
RBC: 4.57 MIL/uL (ref 3.87–5.11)
RDW: 16.3 % — ABNORMAL HIGH (ref 11.5–15.5)
WBC Count: 4.6 10*3/uL (ref 4.0–10.5)

## 2018-05-29 LAB — CMP (CANCER CENTER ONLY)
ALT: 20 U/L (ref 0–44)
AST: 21 U/L (ref 15–41)
Albumin: 3.8 g/dL (ref 3.5–5.0)
Alkaline Phosphatase: 132 U/L — ABNORMAL HIGH (ref 38–126)
Anion gap: 9 (ref 5–15)
BUN: 16 mg/dL (ref 6–20)
CHLORIDE: 109 mmol/L (ref 98–111)
CO2: 23 mmol/L (ref 22–32)
CREATININE: 0.91 mg/dL (ref 0.44–1.00)
Calcium: 9.4 mg/dL (ref 8.9–10.3)
GFR, Est AFR Am: >60 mL/min (ref >60)
GFR, Estimated: >60 mL/min (ref >60)
Glucose, Bld: 109 mg/dL — ABNORMAL HIGH (ref 70–99)
POTASSIUM: 3.9 mmol/L (ref 3.5–5.1)
SODIUM: 141 mmol/L (ref 135–145)
Total Bilirubin: 0.4 mg/dL (ref 0.3–1.2)
Total Protein: 7.2 g/dL (ref 6.5–8.1)

## 2018-05-29 NOTE — Progress Notes (Signed)
Hematology and Oncology Follow Up Visit  Kaitlyn Duncan [id number] 09/14/1970 47 y.o. 05/29/2018 8:50 AM Patient, No Pcp PerPlace[facility], Kaitlyn Muscat, NP   Principle Diagnosis: 47 year old woman with melanoma of unknown primary presented with stage IV disease that is being BRAF positive diagnosed in July 2019.  She was found to have subcutaneous nodules, peritoneal involvement, lung metastasis, and CNS metastasis    Prior Therapy: He is status post subcutaneous nodule biopsy on 01/27/2018 which confirmed the presence of metastatic melanoma. She status post endoscopy on 01/28/2018 which showed subcutaneous involvement that consistent with metastatic melanoma.  Whole brain radiation for total of 30 Gy in 10 fractions started on 02/07/2018.  Current therapy:  Mektovi 45 mg bid, [name] 450 mg daily started in August 2019.    Interim History: Kaitlyn Duncan presents today for a repeat evaluation.  Since her last visit, she reports feeling reasonably well.  She had issues with dyspepsia and reflux and has been treated with proton pump inhibitors and Reglan with improvement in her symptoms.  She denies any recent neurological deficits or excessive weakness.  She continues to have issues with taking Mektovi and [name] including swallowing difficulties.  She denies any new side effects including excessive fatigue or fevers.  Her performance status and quality of life not dramatically changed.   She does not report any headaches, blurry vision.  She denies any alteration mental status or confusion.  She does not report any fevers, chills or sweats.  Does not report any cough, wheezing or hemoptysis.  Does not report any chest pain, palpitation, orthopnea or leg edema.  Does not report any nausea, vomiting or early satiety. Does not report any  hematochezia or melena. Does not report any arthralgias or myalgias.  Does not report frequency, urgency or hematuria.  Does not report any skin rashes or lesions.  Does  not report any ecchymosis or petechiae.  Does not report any mood changes.  Remaining review of systems is negative.    Medications: I have reviewed the patient's current medications.  Current Outpatient Medications  Medication Sig Dispense Refill  . acetaminophen (TYLENOL) 325 MG tablet Take 650 mg by mouth every 6 (six) hours as needed.    Marland Kitchen [name] 75 MG CAPS TAKE 6 CAPSULES (450 MG) BY MOUTH ONCE DAILY 180 capsule 0  . levETIRAcetam (KEPPRA) 1000 MG tablet TAKE 1 TABLET (1,000 MG TOTAL) BY MOUTH 2 (TWO) TIMES DAILY. 60 tablet 2  . loratadine (CLARITIN) 10 MG tablet Take 1 tablet (10 mg total) by mouth daily. 30 tablet 1  . magic mouthwash SOLN Take 5 mLs by mouth 4 (four) times daily as needed for mouth pain. 250 mL 2  . MEKTOVI 15 MG TABS TAKE 3 TABLETS (45 MG) BY MOUTH TWICE A DAY 180 tablet 0  . metoCLOPramide (REGLAN) 5 MG tablet Take 1 tablet (5 mg total) by mouth 4 (four) times daily. 120 tablet 0  . Multiple Vitamin (MULTIVITAMIN WITH MINERALS) TABS tablet Take 1 tablet by mouth daily.    . mupirocin ointment (BACTROBAN) 2 % Place 1 application into the nose 2 (two) times daily. 30 g 1  . ondansetron (ZOFRAN) 8 MG tablet Take 1 tablet (8 mg total) by mouth every 8 (eight) hours as needed for nausea or vomiting. (Patient not taking: Reported on 03/30/2018) 20 tablet 0  . oxybutynin (DITROPAN XL) 10 MG 24 hr tablet Take 1 tablet (10 mg total) by mouth at bedtime. 30 tablet 5  . RABEprazole (ACIPHEX) 20  MG tablet Take 1 tablet (20 mg total) by mouth daily. 30 tablet 5   No current facility-administered medications for this visit.      Allergies: No Known Allergies  Past Medical History, Surgical history, Social history, and Family History were reviewed and updated.  Review of Systems:    Physical Exam:  Blood pressure 119/90, pulse 88, temperature 98.6 F (37 C), temperature source Oral, resp. rate 18, height 5' (1.524 m), weight 145 lb 12.8 oz (66.1 kg), SpO2 97  %.     ECOG: 1   General appearance: Comfortable appearing without any discomfort Head: Normocephalic without any trauma Oropharynx: Mucous membranes are moist and pink without any thrush or ulcers. Eyes: Pupils are equal and round reactive to light. Lymph nodes:  She still has palpable adenopathy in the right axillary region.  No other adenopathy noted. Heart:regular rate and rhythm.  S1 and S2 without leg edema. Lung: Clear without any rhonchi or wheezes.  No dullness to percussion. Abdomin: Soft, nontender, nondistended with good bowel sounds.  No hepatosplenomegaly. Musculoskeletal: No joint deformity or effusion.  Full range of motion noted. Neurological: No deficits noted on motor, sensory and deep tendon reflex exam. Skin: No petechial rash or dryness.  Appeared moist.       Lab Results: Lab Results  Component Value Date   WBC 3.9 (L) 05/19/2018   HGB 9.2 (L) 05/19/2018   HCT 30.9 (L) 05/19/2018   MCV 73.7 (L) 05/19/2018   PLT 107 (L) 05/19/2018     Chemistry      Component Value Date/Time   NA 136 05/19/2018 1339   K 4.0 05/19/2018 1339   CL 104 05/19/2018 1339   CO2 24 05/19/2018 1339   BUN 25 (H) 05/19/2018 1339   CREATININE 0.88 05/19/2018 1339      Component Value Date/Time   CALCIUM 8.9 05/19/2018 1339   ALKPHOS 105 05/19/2018 1339   AST 45 (H) 05/19/2018 1339   ALT 33 05/19/2018 1339   BILITOT 0.4 05/19/2018 1339     Impression and Plan:   47 year old woman with:  1.  BRAF positive stage IV melanoma diagnosed in July 2019 with CNS metastasis as well as diffuse lymphadenopathy.  She remains on Mektovi 45 mg bid and [name] 450 mg daily without any major complications.  Her disease status was updated and risks and benefits of continuing this therapy was discussed versus switching to immunotherapy based approach.  After discussion today given her difficulty with taking oral pills and swallowing regularly, I have recommended switching her to  Keytruda 200 mg every 3 weeks.  Complications associated with this therapy and side effects associated with immunotherapy in general was discussed.  These complications include fatigue, pruritus, rash and immune mediated complications.  These include colitis, pneumonitis, thyroid disease and rarely arthritis and CNS complications.  After discussion the plan is to start her in the near future and to hold off on any oral targeted therapy to be used in this office setting.   2.  CNS metastasis: MRI in November 2019 showed no new metastasis.  3.  Prognosis: Therapy remains palliative at this time although her performance status is excellent and aggressive therapy is warranted.  4.  Psychosocial consideration: She still getting assistant for transportation as well as overall living situation.  No major issues reported at this time.  5.  Cardiac monitoring: Echocardiogram repeated on May 26, 2018 showed normal EF.  6.  Follow-up: We will be in 1 week to start  Keytruda and in 3 weeks for repeat evaluation.  25  minutes was spent with the patient face-to-face today.  More than 50% of time was spent on reviewing her disease status, treatment options and coordinating treatment care.   Zola Button, MD 11/25/20198:50 AM

## 2018-05-29 NOTE — Telephone Encounter (Signed)
Printed calendar and avs. °

## 2018-05-29 NOTE — Progress Notes (Signed)
START ON PATHWAY REGIMEN - Melanoma     A cycle is 21 days:     Pembrolizumab   **Always confirm dose/schedule in your pharmacy ordering system**  Patient Characteristics: Distant Metastases or Unresectable Local Recurrence, Unresectable, Asymptomatic, Second Line, BRAF V600 Activating Mutation Positive, No Prior PD-1 Inhibitor Disease Subtype: Cutaneous Current Disease Status: Distant Metastases BRAF V600 Mutation Status: BRAF V600 Activating Mutation Positive Metastatic Disease Type: Asymptomatic Line of Therapy: Second Line Prior Immunotherapy Status: No Prior PD-1 Inhibitor Intent of Therapy: Non-Curative / Palliative Intent, Discussed with Patient

## 2018-06-03 ENCOUNTER — Telehealth: Payer: Self-pay

## 2018-06-05 ENCOUNTER — Inpatient Hospital Stay: Payer: Medicaid Other

## 2018-06-05 ENCOUNTER — Inpatient Hospital Stay: Payer: Medicaid Other | Attending: Obstetrics and Gynecology

## 2018-06-05 VITALS — BP 125/87 | HR 78 | Temp 97.9°F | Resp 18

## 2018-06-05 DIAGNOSIS — C7949 Secondary malignant neoplasm of other parts of nervous system: Secondary | ICD-10-CM | POA: Diagnosis not present

## 2018-06-05 DIAGNOSIS — Z79899 Other long term (current) drug therapy: Secondary | ICD-10-CM | POA: Insufficient documentation

## 2018-06-05 DIAGNOSIS — Z5112 Encounter for antineoplastic immunotherapy: Secondary | ICD-10-CM | POA: Diagnosis present

## 2018-06-05 DIAGNOSIS — C7800 Secondary malignant neoplasm of unspecified lung: Secondary | ICD-10-CM | POA: Diagnosis not present

## 2018-06-05 DIAGNOSIS — C439 Malignant melanoma of skin, unspecified: Secondary | ICD-10-CM

## 2018-06-05 LAB — CBC WITH DIFFERENTIAL (CANCER CENTER ONLY)
ABS IMMATURE GRANULOCYTES: 0 10*3/uL (ref 0.00–0.07)
Basophils Absolute: 0 10*3/uL (ref 0.0–0.1)
Basophils Relative: 0 %
Eosinophils Absolute: 0 10*3/uL (ref 0.0–0.5)
Eosinophils Relative: 1 %
HEMATOCRIT: 32.4 % — AB (ref 36.0–46.0)
HEMOGLOBIN: 9.5 g/dL — AB (ref 12.0–15.0)
Immature Granulocytes: 0 %
LYMPHS ABS: 0.6 10*3/uL — AB (ref 0.7–4.0)
LYMPHS PCT: 17 %
MCH: 22.4 pg — AB (ref 26.0–34.0)
MCHC: 29.3 g/dL — ABNORMAL LOW (ref 30.0–36.0)
MCV: 76.2 fL — ABNORMAL LOW (ref 80.0–100.0)
MONOS PCT: 14 %
Monocytes Absolute: 0.5 10*3/uL (ref 0.1–1.0)
NEUTROS ABS: 2.3 10*3/uL (ref 1.7–7.7)
Neutrophils Relative %: 68 %
Platelet Count: 102 10*3/uL — ABNORMAL LOW (ref 150–400)
RBC: 4.25 MIL/uL (ref 3.87–5.11)
RDW: 16.7 % — ABNORMAL HIGH (ref 11.5–15.5)
WBC: 3.3 10*3/uL — AB (ref 4.0–10.5)
nRBC: 0 % (ref 0.0–0.2)

## 2018-06-05 LAB — CMP (CANCER CENTER ONLY)
ALK PHOS: 98 U/L (ref 38–126)
ALT: 15 U/L (ref 0–44)
AST: 22 U/L (ref 15–41)
Albumin: 3.8 g/dL (ref 3.5–5.0)
Anion gap: 11 (ref 5–15)
BUN: 10 mg/dL (ref 6–20)
CALCIUM: 9.3 mg/dL (ref 8.9–10.3)
CHLORIDE: 108 mmol/L (ref 98–111)
CO2: 22 mmol/L (ref 22–32)
CREATININE: 0.77 mg/dL (ref 0.44–1.00)
GFR, Estimated: >60 mL/min (ref >60)
Glucose, Bld: 174 mg/dL — ABNORMAL HIGH (ref 70–99)
Potassium: 4.1 mmol/L (ref 3.5–5.1)
SODIUM: 141 mmol/L (ref 135–145)
Total Bilirubin: 0.3 mg/dL (ref 0.3–1.2)
Total Protein: 6.9 g/dL (ref 6.5–8.1)

## 2018-06-05 LAB — TSH: TSH: 2.075 u[IU]/mL (ref 0.308–3.960)

## 2018-06-05 MED ORDER — SODIUM CHLORIDE 0.9 % IV SOLN
200.0000 mg | Freq: Once | INTRAVENOUS | Status: AC
  Administered 2018-06-05: 200 mg via INTRAVENOUS
  Filled 2018-06-05: qty 8

## 2018-06-05 MED ORDER — SODIUM CHLORIDE 0.9 % IV SOLN
Freq: Once | INTRAVENOUS | Status: AC
  Administered 2018-06-05: 10:00:00 via INTRAVENOUS
  Filled 2018-06-05: qty 250

## 2018-06-05 MED FILL — levETIRAcetam 1000 MG TABS: 1000 | 30 days supply | Qty: 60 | Fill #1

## 2018-06-05 NOTE — Patient Instructions (Signed)
Pembrolizumab injection  What is this medicine?  PEMBROLIZUMAB (pem broe liz ue mab) is a monoclonal antibody. It is used to treat melanoma, head and neck cancer, Hodgkin lymphoma, non-small cell lung cancer, urothelial cancer, stomach cancer, and cancers that have a certain genetic condition.  This medicine may be used for other purposes; ask your health care provider or pharmacist if you have questions.  COMMON BRAND NAME(S): Keytruda  What should I tell my health care provider before I take this medicine?  They need to know if you have any of these conditions:  -diabetes  -immune system problems  -inflammatory bowel disease  -liver disease  -lung or breathing disease  -lupus  -organ transplant  -an unusual or allergic reaction to pembrolizumab, other medicines, foods, dyes, or preservatives  -pregnant or trying to get pregnant  -breast-feeding  How should I use this medicine?  This medicine is for infusion into a vein. It is given by a health care professional in a hospital or clinic setting.  A special MedGuide will be given to you before each treatment. Be sure to read this information carefully each time.  Talk to your pediatrician regarding the use of this medicine in children. While this drug may be prescribed for selected conditions, precautions do apply.  Overdosage: If you think you have taken too much of this medicine contact a poison control center or emergency room at once.  NOTE: This medicine is only for you. Do not share this medicine with others.  What if I miss a dose?  It is important not to miss your dose. Call your doctor or health care professional if you are unable to keep an appointment.  What may interact with this medicine?  Interactions have not been studied.  Give your health care provider a list of all the medicines, herbs, non-prescription drugs, or dietary supplements you use. Also tell them if you smoke, drink alcohol, or use illegal drugs. Some items may interact with your  medicine.  This list may not describe all possible interactions. Give your health care provider a list of all the medicines, herbs, non-prescription drugs, or dietary supplements you use. Also tell them if you smoke, drink alcohol, or use illegal drugs. Some items may interact with your medicine.  What should I watch for while using this medicine?  Your condition will be monitored carefully while you are receiving this medicine.  You may need blood work done while you are taking this medicine.  Do not become pregnant while taking this medicine or for 4 months after stopping it. Women should inform their doctor if they wish to become pregnant or think they might be pregnant. There is a potential for serious side effects to an unborn child. Talk to your health care professional or pharmacist for more information. Do not breast-feed an infant while taking this medicine or for 4 months after the last dose.  What side effects may I notice from receiving this medicine?  Side effects that you should report to your doctor or health care professional as soon as possible:  -allergic reactions like skin rash, itching or hives, swelling of the face, lips, or tongue  -bloody or black, tarry  -breathing problems  -changes in vision  -chest pain  -chills  -constipation  -cough  -dizziness or feeling faint or lightheaded  -fast or irregular heartbeat  -fever  -flushing  -hair loss  -low blood counts - this medicine may decrease the number of white blood cells, red blood cells   and platelets. You may be at increased risk for infections and bleeding.  -muscle pain  -muscle weakness  -persistent headache  -signs and symptoms of high blood sugar such as dizziness; dry mouth; dry skin; fruity breath; nausea; stomach pain; increased hunger or thirst; increased urination  -signs and symptoms of kidney injury like trouble passing urine or change in the amount of urine  -signs and symptoms of liver injury like dark urine, light-colored  stools, loss of appetite, nausea, right upper belly pain, yellowing of the eyes or skin  -stomach pain  -sweating  -weight loss  Side effects that usually do not require medical attention (report to your doctor or health care professional if they continue or are bothersome):  -decreased appetite  -diarrhea  -tiredness  This list may not describe all possible side effects. Call your doctor for medical advice about side effects. You may report side effects to FDA at 1-800-FDA-1088.  Where should I keep my medicine?  This drug is given in a hospital or clinic and will not be stored at home.  NOTE: This sheet is a summary. It may not cover all possible information. If you have questions about this medicine, talk to your doctor, pharmacist, or health care provider.   2018 Elsevier/Gold Standard (2016-03-30 12:29:36)

## 2018-06-06 ENCOUNTER — Telehealth: Payer: Self-pay

## 2018-06-06 MED FILL — MUPIROCIN 2% OINTMENT: 2 | 5 days supply | Qty: 22 | Fill #1

## 2018-06-12 ENCOUNTER — Other Ambulatory Visit: Payer: Self-pay | Admitting: Oncology

## 2018-06-14 ENCOUNTER — Other Ambulatory Visit: Payer: Self-pay | Admitting: Oncology

## 2018-06-14 MED FILL — OXYBUTYNIN CL ER 10 MG TAB: 10 | 30 days supply | Qty: 30 | Fill #1

## 2018-06-23 ENCOUNTER — Other Ambulatory Visit: Payer: Self-pay | Admitting: *Deleted

## 2018-06-26 ENCOUNTER — Other Ambulatory Visit: Payer: Self-pay | Admitting: Oncology

## 2018-06-26 ENCOUNTER — Inpatient Hospital Stay (HOSPITAL_BASED_OUTPATIENT_CLINIC_OR_DEPARTMENT_OTHER): Payer: Medicaid Other | Admitting: Oncology

## 2018-06-26 ENCOUNTER — Inpatient Hospital Stay: Payer: Medicaid Other

## 2018-06-26 VITALS — BP 125/85 | HR 69 | Temp 97.8°F | Resp 18 | Ht 60.0 in | Wt 150.3 lb

## 2018-06-26 DIAGNOSIS — Z5112 Encounter for antineoplastic immunotherapy: Secondary | ICD-10-CM | POA: Diagnosis not present

## 2018-06-26 DIAGNOSIS — C7949 Secondary malignant neoplasm of other parts of nervous system: Secondary | ICD-10-CM

## 2018-06-26 DIAGNOSIS — C439 Malignant melanoma of skin, unspecified: Secondary | ICD-10-CM

## 2018-06-26 DIAGNOSIS — C7800 Secondary malignant neoplasm of unspecified lung: Secondary | ICD-10-CM

## 2018-06-26 DIAGNOSIS — C786 Secondary malignant neoplasm of retroperitoneum and peritoneum: Secondary | ICD-10-CM

## 2018-06-26 DIAGNOSIS — Z923 Personal history of irradiation: Secondary | ICD-10-CM

## 2018-06-26 DIAGNOSIS — Z79899 Other long term (current) drug therapy: Secondary | ICD-10-CM

## 2018-06-26 LAB — CBC WITH DIFFERENTIAL (CANCER CENTER ONLY)
Abs Immature Granulocytes: 0.01 10*3/uL (ref 0.00–0.07)
Basophils Absolute: 0 10*3/uL (ref 0.0–0.1)
Basophils Relative: 0 %
Eosinophils Absolute: 0.1 10*3/uL (ref 0.0–0.5)
Eosinophils Relative: 2 %
HCT: 34.5 % — ABNORMAL LOW (ref 36.0–46.0)
Hemoglobin: 10.3 g/dL — ABNORMAL LOW (ref 12.0–15.0)
IMMATURE GRANULOCYTES: 0 %
LYMPHS PCT: 15 %
Lymphs Abs: 0.7 10*3/uL (ref 0.7–4.0)
MCH: 22.2 pg — ABNORMAL LOW (ref 26.0–34.0)
MCHC: 29.9 g/dL — ABNORMAL LOW (ref 30.0–36.0)
MCV: 74.4 fL — ABNORMAL LOW (ref 80.0–100.0)
Monocytes Absolute: 0.6 10*3/uL (ref 0.1–1.0)
Monocytes Relative: 12 %
Neutro Abs: 3.2 10*3/uL (ref 1.7–7.7)
Neutrophils Relative %: 71 %
Platelet Count: 86 10*3/uL — ABNORMAL LOW (ref 150–400)
RBC: 4.64 MIL/uL (ref 3.87–5.11)
RDW: 16 % — ABNORMAL HIGH (ref 11.5–15.5)
WBC Count: 4.5 10*3/uL (ref 4.0–10.5)
nRBC: 0 % (ref 0.0–0.2)

## 2018-06-26 LAB — CMP (CANCER CENTER ONLY)
ALT: 13 U/L (ref 0–44)
AST: 22 U/L (ref 15–41)
Albumin: 4.1 g/dL (ref 3.5–5.0)
Alkaline Phosphatase: 86 U/L (ref 38–126)
Anion gap: 9 (ref 5–15)
BUN: 20 mg/dL (ref 6–20)
CO2: 22 mmol/L (ref 22–32)
Calcium: 9.6 mg/dL (ref 8.9–10.3)
Chloride: 109 mmol/L (ref 98–111)
Creatinine: 0.76 mg/dL (ref 0.44–1.00)
GFR, Est AFR Am: >60 mL/min (ref >60)
GFR, Estimated: >60 mL/min (ref >60)
GLUCOSE: 109 mg/dL — AB (ref 70–99)
Potassium: 4.1 mmol/L (ref 3.5–5.1)
Sodium: 140 mmol/L (ref 135–145)
Total Bilirubin: 0.3 mg/dL (ref 0.3–1.2)
Total Protein: 7.4 g/dL (ref 6.5–8.1)

## 2018-06-26 MED ORDER — SODIUM CHLORIDE 0.9 % IV SOLN
Freq: Once | INTRAVENOUS | Status: AC
  Administered 2018-06-26: 10:00:00 via INTRAVENOUS
  Filled 2018-06-26: qty 250

## 2018-06-26 MED ORDER — SODIUM CHLORIDE 0.9 % IV SOLN
200.0000 mg | Freq: Once | INTRAVENOUS | Status: AC
  Administered 2018-06-26: 200 mg via INTRAVENOUS
  Filled 2018-06-26: qty 8

## 2018-06-26 MED FILL — RABEPRAZOLE SOD DR 20 MG TA: 20 | 30 days supply | Qty: 30 | Fill #1

## 2018-06-26 NOTE — Progress Notes (Signed)
OK to treat with platelet counts 86 today per Dr. Alen Blew.

## 2018-06-26 NOTE — Patient Instructions (Signed)
[facility] Discharge Instructions for Patients Receiving Chemotherapy  Today you received the following chemotherapy agents :  Keytruda.  To help prevent nausea and vomiting after your treatment, we encourage you to take your nausea medication as prescribed.   If you develop nausea and vomiting that is not controlled by your nausea medication, call the clinic.   BELOW ARE SYMPTOMS THAT SHOULD BE REPORTED IMMEDIATELY:  *FEVER GREATER THAN 100.5 F  *CHILLS WITH OR WITHOUT FEVER  NAUSEA AND VOMITING THAT IS NOT CONTROLLED WITH YOUR NAUSEA MEDICATION  *UNUSUAL SHORTNESS OF BREATH  *UNUSUAL BRUISING OR BLEEDING  TENDERNESS IN MOUTH AND THROAT WITH OR WITHOUT PRESENCE OF ULCERS  *URINARY PROBLEMS  *BOWEL PROBLEMS  UNUSUAL RASH Items with * indicate a potential emergency and should be followed up as soon as possible.  Feel free to call the clinic should you have any questions or concerns. The clinic phone number is [phone].  Please show the CHEMO ALERT CARD at check-in to the Emergency Department and triage nurse.  

## 2018-06-26 NOTE — Progress Notes (Signed)
Hematology and Oncology Follow Up Visit  Kaitlyn Duncan [id number] Apr 03, 1971 47 y.o. 06/26/2018 8:52 AM Patient, No Pcp Per[facility], [name], NP   Principle Diagnosis: 47 year old woman with stage IV melanoma with peritoneal and lung involvement diagnosed in July 2019.  Her tumor arise of unknown primary, BRAF positive at the time of diagnosis.   Prior Therapy: He is status post subcutaneous nodule biopsy on 01/27/2018 which confirmed the presence of metastatic melanoma. She status post endoscopy on 01/28/2018 which showed subcutaneous involvement that consistent with metastatic melanoma.  Whole brain radiation for total of 30 Gy in 10 fractions started on 02/07/2018.   Mektovi 45 mg bid, [name] 450 mg daily started in August 2019.  Therapy discontinued in November 2019 after partial response.  Current therapy: Pembrolizumab 200 mg every 3 weeks started on 06/05/2018.  She is here for cycle 2 of therapy.    Interim History: [name] returns today for a repeat evaluation.  Since the last visit, she received the first cycle of Pembrolizumab without complications.  She feels much better on this treatment compared to previous oral targeted therapy.  She is reporting more energy and less fatigue.  She denies any dyspepsia or abdominal distention.  She denies any palpable adenopathy or constitutional symptoms.  Her performance status and activity level remain excellent.   She does not report any headaches, blurry vision.  She denies any lethargy or dizziness.  She does not report any fevers, chills or sweats.  Does not report any cough, wheezing or hemoptysis.  Does not report any chest pain, palpitation, orthopnea or leg edema.  Does not report any nausea, vomiting or abdominal distention. Does not report any changes in bowel habits. Does not report any bone pain or pathological fractures.  Does not report frequency, urgency or hematuria.  Does not report any skin rashes or lesions.  Does  not report any irritation or ecchymosis.  Does not report any anxiety or depression.  Remaining review of systems is negative.    Medications: I have reviewed the patient's current medications.  Current Outpatient Medications  Medication Sig Dispense Refill  . acetaminophen (TYLENOL) 325 MG tablet Take 650 mg by mouth every 6 (six) hours as needed.    Marland Kitchen [name] 75 MG CAPS TAKE 6 CAPSULES (450 MG) BY MOUTH ONCE DAILY 180 capsule 0  . levETIRAcetam (KEPPRA) 1000 MG tablet TAKE 1 TABLET (1,000 MG TOTAL) BY MOUTH 2 (TWO) TIMES DAILY. 60 tablet 2  . loratadine (CLARITIN) 10 MG tablet Take 1 tablet (10 mg total) by mouth daily. 30 tablet 1  . magic mouthwash SOLN Take 5 mLs by mouth 4 (four) times daily as needed for mouth pain. 250 mL 2  . MEKTOVI 15 MG TABS TAKE 3 TABLETS (45 MG) BY MOUTH TWICE A DAY 180 tablet 0  . metoCLOPramide (REGLAN) 5 MG tablet Take 1 tablet (5 mg total) by mouth 4 (four) times daily. 120 tablet 0  . Multiple Vitamin (MULTIVITAMIN WITH MINERALS) TABS tablet Take 1 tablet by mouth daily.    . mupirocin ointment (BACTROBAN) 2 % Place 1 application into the nose 2 (two) times daily. 30 g 1  . ondansetron (ZOFRAN) 8 MG tablet Take 1 tablet (8 mg total) by mouth every 8 (eight) hours as needed for nausea or vomiting. (Patient not taking: Reported on 03/30/2018) 20 tablet 0  . oxybutynin (DITROPAN XL) 10 MG 24 hr tablet Take 1 tablet (10 mg total) by mouth at bedtime. 30 tablet 5  .  RABEprazole (ACIPHEX) 20 MG tablet Take 1 tablet (20 mg total) by mouth daily. 30 tablet 5   No current facility-administered medications for this visit.      Allergies: No Known Allergies  Past Medical History, Surgical history, Social history, and Family History were reviewed and updated.  Review of Systems:    Physical Exam:  Blood pressure 125/85, pulse 69, temperature 97.8 F (36.6 C), temperature source Oral, resp. rate 18, height 5' (1.524 m), weight 150 lb 4.8 oz (68.2 kg), SpO2 100  %.      ECOG: 1   General appearance: Alert, awake without any distress. Head: Atraumatic without abnormalities Oropharynx: Without any thrush or ulcers. Eyes: No scleral icterus. Lymph nodes: No lymphadenopathy noted in the cervical, supraclavicular, or axillary nodes Heart:regular rate and rhythm, without any murmurs or gallops.   Lung: Clear to auscultation without any rhonchi, wheezes or dullness to percussion. Abdomin: Soft, nontender without any shifting dullness or ascites. Musculoskeletal: No clubbing or cyanosis. Neurological: No motor or sensory deficits. Skin: No rashes or lesions.       Lab Results: Lab Results  Component Value Date   WBC 3.3 (L) 06/05/2018   HGB 9.5 (L) 06/05/2018   HCT 32.4 (L) 06/05/2018   MCV 76.2 (L) 06/05/2018   PLT 102 (L) 06/05/2018     Chemistry      Component Value Date/Time   NA 141 06/05/2018 0759   K 4.1 06/05/2018 0759   CL 108 06/05/2018 0759   CO2 22 06/05/2018 0759   BUN 10 06/05/2018 0759   CREATININE 0.77 06/05/2018 0759      Component Value Date/Time   CALCIUM 9.3 06/05/2018 0759   ALKPHOS 98 06/05/2018 0759   AST 22 06/05/2018 0759   ALT 15 06/05/2018 0759   BILITOT 0.3 06/05/2018 0759     Impression and Plan:   47 year old woman with:  1.  Stage IV melanoma diagnosed July 2019.  She presented with diffuse metastasis including peritoneal disease, lymphadenopathy as well as CNS metastasis.  Her tumor was BRAF positive.   She received BRAF targeted therapy with reasonable response and switch to immunotherapy in December 2019.  She tolerated the first cycle of Keytruda without any major complications.  Risks and benefits of continuing this therapy was reviewed today.  Alternative therapy options were reviewed as well including different immune agents or switching back to oral targeted therapy.  At this time she is agreeable to continue with Pembrolizumab and will repeat imaging studies in February 2020.   2.   CNS metastasis: No neurological issues reported at this time.  Last MRI in November 2019 showed no abnormalities.  3.  Prognosis: Therapy remains palliative although her performance status is reasonable and aggressive therapy is warranted.  4.  Psychosocial consideration: She continues to receive assistance for transportation and other basic needs.  Her needs are addressed at this time.  5.  Immune mediated complications: Continue to reiterate issues associated with immunotherapy.  These include thyroid disease, skin changes, pruritus, pneumonitis, hepatitis among others.  6.  Follow-up: We will be in 3 weeks for the next cycle of Pembrolizumab.  25  minutes was spent with the patient face-to-face today.  More than 50% of time was spent on discussing treatment options, complications related therapy and answering question regarding future plan of care.   Zola Button, MD 12/23/20198:52 AM

## 2018-06-27 ENCOUNTER — Other Ambulatory Visit: Payer: Self-pay | Admitting: Radiation Therapy

## 2018-06-27 DIAGNOSIS — C7949 Secondary malignant neoplasm of other parts of nervous system: Principal | ICD-10-CM

## 2018-06-27 DIAGNOSIS — C7931 Secondary malignant neoplasm of brain: Secondary | ICD-10-CM

## 2018-06-27 MED FILL — SM LORATADINE 10 MG TABS: 10 | 30 days supply | Qty: 30 | Fill #0

## 2018-06-30 ENCOUNTER — Telehealth: Payer: Self-pay | Admitting: Radiation Therapy

## 2018-06-30 NOTE — Telephone Encounter (Signed)
Left a detailed vm on 12/27 about the Feb brain MRI and follow-up with Ashlyn.   Mont Dutton R.T.(R)(T)

## 2018-07-04 ENCOUNTER — Other Ambulatory Visit: Payer: Self-pay | Admitting: Medical

## 2018-07-04 DIAGNOSIS — R110 Nausea: Secondary | ICD-10-CM

## 2018-07-04 MED FILL — levETIRAcetam 1000 MG TABS: 1000 | 30 days supply | Qty: 60 | Fill #2

## 2018-07-14 ENCOUNTER — Encounter: Payer: Self-pay | Admitting: Pharmacy Technician

## 2018-07-14 NOTE — Progress Notes (Signed)
The patient is approved for drug assistance by [employer] for [employer].  Enrollment is effective until 07/15/19 and is based on self pay. Drug replacement begins on DOS 06/05/18.

## 2018-07-17 ENCOUNTER — Telehealth: Payer: Self-pay

## 2018-07-17 ENCOUNTER — Inpatient Hospital Stay: Payer: Medicaid Other | Attending: Obstetrics and Gynecology

## 2018-07-17 ENCOUNTER — Inpatient Hospital Stay (HOSPITAL_BASED_OUTPATIENT_CLINIC_OR_DEPARTMENT_OTHER): Payer: Medicaid Other | Admitting: Oncology

## 2018-07-17 ENCOUNTER — Inpatient Hospital Stay: Payer: Medicaid Other

## 2018-07-17 VITALS — BP 121/83 | HR 95 | Temp 98.4°F | Resp 18 | Ht 60.0 in | Wt 147.1 lb

## 2018-07-17 DIAGNOSIS — C7800 Secondary malignant neoplasm of unspecified lung: Secondary | ICD-10-CM | POA: Diagnosis not present

## 2018-07-17 DIAGNOSIS — Z9221 Personal history of antineoplastic chemotherapy: Secondary | ICD-10-CM | POA: Insufficient documentation

## 2018-07-17 DIAGNOSIS — Z79899 Other long term (current) drug therapy: Secondary | ICD-10-CM | POA: Insufficient documentation

## 2018-07-17 DIAGNOSIS — C7931 Secondary malignant neoplasm of brain: Secondary | ICD-10-CM

## 2018-07-17 DIAGNOSIS — C439 Malignant melanoma of skin, unspecified: Secondary | ICD-10-CM

## 2018-07-17 DIAGNOSIS — C499 Malignant neoplasm of connective and soft tissue, unspecified: Secondary | ICD-10-CM | POA: Insufficient documentation

## 2018-07-17 DIAGNOSIS — C7940 Secondary malignant neoplasm of unspecified part of nervous system: Secondary | ICD-10-CM | POA: Insufficient documentation

## 2018-07-17 DIAGNOSIS — Z5112 Encounter for antineoplastic immunotherapy: Secondary | ICD-10-CM | POA: Diagnosis present

## 2018-07-17 DIAGNOSIS — C786 Secondary malignant neoplasm of retroperitoneum and peritoneum: Secondary | ICD-10-CM

## 2018-07-17 DIAGNOSIS — Z923 Personal history of irradiation: Secondary | ICD-10-CM | POA: Insufficient documentation

## 2018-07-17 LAB — CBC WITH DIFFERENTIAL (CANCER CENTER ONLY)
Abs Immature Granulocytes: 0.01 10*3/uL (ref 0.00–0.07)
BASOS PCT: 0 %
Basophils Absolute: 0 10*3/uL (ref 0.0–0.1)
Eosinophils Absolute: 0 10*3/uL (ref 0.0–0.5)
Eosinophils Relative: 0 %
HCT: 35.2 % — ABNORMAL LOW (ref 36.0–46.0)
Hemoglobin: 10.6 g/dL — ABNORMAL LOW (ref 12.0–15.0)
Immature Granulocytes: 0 %
Lymphocytes Relative: 20 %
Lymphs Abs: 0.9 10*3/uL (ref 0.7–4.0)
MCH: 22.3 pg — ABNORMAL LOW (ref 26.0–34.0)
MCHC: 30.1 g/dL (ref 30.0–36.0)
MCV: 73.9 fL — ABNORMAL LOW (ref 80.0–100.0)
Monocytes Absolute: 0.6 10*3/uL (ref 0.1–1.0)
Monocytes Relative: 13 %
NEUTROS ABS: 3 10*3/uL (ref 1.7–7.7)
Neutrophils Relative %: 67 %
PLATELETS: 108 10*3/uL — AB (ref 150–400)
RBC: 4.76 MIL/uL (ref 3.87–5.11)
RDW: 15.5 % (ref 11.5–15.5)
WBC Count: 4.5 10*3/uL (ref 4.0–10.5)
nRBC: 0 % (ref 0.0–0.2)

## 2018-07-17 LAB — CMP (CANCER CENTER ONLY)
ALT: 11 U/L (ref 0–44)
AST: 22 U/L (ref 15–41)
Albumin: 4.3 g/dL (ref 3.5–5.0)
Alkaline Phosphatase: 93 U/L (ref 38–126)
Anion gap: 8 (ref 5–15)
BUN: 19 mg/dL (ref 6–20)
CO2: 24 mmol/L (ref 22–32)
Calcium: 9.7 mg/dL (ref 8.9–10.3)
Chloride: 109 mmol/L (ref 98–111)
Creatinine: 0.77 mg/dL (ref 0.44–1.00)
GFR, Est AFR Am: >60 mL/min (ref >60)
GFR, Estimated: >60 mL/min (ref >60)
Glucose, Bld: 102 mg/dL — ABNORMAL HIGH (ref 70–99)
POTASSIUM: 4 mmol/L (ref 3.5–5.1)
Sodium: 141 mmol/L (ref 135–145)
TOTAL PROTEIN: 7.4 g/dL (ref 6.5–8.1)
Total Bilirubin: 0.5 mg/dL (ref 0.3–1.2)

## 2018-07-17 MED ORDER — SODIUM CHLORIDE 0.9 % IV SOLN
200.0000 mg | Freq: Once | INTRAVENOUS | Status: AC
  Administered 2018-07-17: 200 mg via INTRAVENOUS
  Filled 2018-07-17: qty 8

## 2018-07-17 MED ORDER — SODIUM CHLORIDE 0.9 % IV SOLN
Freq: Once | INTRAVENOUS | Status: AC
  Administered 2018-07-17: 14:00:00 via INTRAVENOUS
  Filled 2018-07-17: qty 250

## 2018-07-17 MED FILL — OXYBUTYNIN CL ER 10 MG TAB: 10 | 30 days supply | Qty: 30 | Fill #2

## 2018-07-17 NOTE — Telephone Encounter (Signed)
Printed avs and calender of upcoming appointment. Per 1/13 los 

## 2018-07-17 NOTE — Progress Notes (Signed)
Hematology and Oncology Follow Up Visit  Kaitlyn Duncan [id number] 10/26/1970 48 y.o. 07/17/2018 10:55 AM Patient, No Pcp [name], MD   Principle Diagnosis: 48 year old woman with  BRAF positive, stage IV melanoma with peritoneal and lung involvement diagnosed in July 2019.     Prior Therapy: He is status post subcutaneous nodule biopsy on 01/27/2018 which confirmed the presence of metastatic melanoma. She status post endoscopy on 01/28/2018 which showed subcutaneous involvement that consistent with metastatic melanoma.  Whole brain radiation for total of 30 Gy in 10 fractions started on 02/07/2018.   Mektovi 45 mg bid, [name] 450 mg daily started in August 2019.  Therapy discontinued in November 2019 after partial response.  Current therapy: Pembrolizumab 200 mg every 3 weeks started on 06/05/2018.  She is here for cycle 3 of therapy.    Interim History: Kaitlyn Duncan presents today for a repeat evaluation.  Since the last visit, she reports no major changes in her health.  She continues to tolerate Pembrolizumab without any complaints.  She does report some skin dryness but no rash.  She denies any changes in her bowel habits including no diarrhea.  Her performance status and quality of life remain excellent.  She denies any changes in her mentation or seizures.  She continues to be ambulatory without any decline in ability to do so.   She does not report any headaches, blurry vision.  She denies any alteration mental status or confusion.  She does not report any fevers, chills or sweats.  Does not report any cough, wheezing or hemoptysis.  Does not report any chest pain, palpitation, orthopnea or leg edema.  Does not report any nausea, vomiting or early satiety. Does report some constipation. Does not report any paralysis or bone fractures.  Does not report frequency, urgency or hematuria.  Does not report any ecchymosis or petechiae.  Does not report any bleeding or clotting  tendency.  Does not report any changes in her mood.  Denies any heat or cold intolerance.  Remaining review of systems is negative.    Medications: I have reviewed the patient's current medications.  Current Outpatient Medications  Medication Sig Dispense Refill  . acetaminophen (TYLENOL) 325 MG tablet Take 650 mg by mouth every 6 (six) hours as needed.    Marland Kitchen [name] 75 MG CAPS TAKE 6 CAPSULES (450 MG) BY MOUTH ONCE DAILY 180 capsule 0  . levETIRAcetam (KEPPRA) 1000 MG tablet TAKE 1 TABLET (1,000 MG TOTAL) BY MOUTH 2 (TWO) TIMES DAILY. 60 tablet 2  . magic mouthwash SOLN Take 5 mLs by mouth 4 (four) times daily as needed for mouth pain. 250 mL 2  . MEKTOVI 15 MG TABS TAKE 3 TABLETS (45 MG) BY MOUTH TWICE A DAY 180 tablet 0  . metoCLOPramide (REGLAN) 5 MG tablet TAKE 1 TABLET BY MOUTH FOUR TIMES DAILY 120 tablet 0  . Multiple Vitamin (MULTIVITAMIN WITH MINERALS) TABS tablet Take 1 tablet by mouth daily.    . mupirocin ointment (BACTROBAN) 2 % Place 1 application into the nose 2 (two) times daily. 30 g 1  . ondansetron (ZOFRAN) 8 MG tablet Take 1 tablet (8 mg total) by mouth every 8 (eight) hours as needed for nausea or vomiting. (Patient not taking: Reported on 03/30/2018) 20 tablet 0  . oxybutynin (DITROPAN XL) 10 MG 24 hr tablet Take 1 tablet (10 mg total) by mouth at bedtime. 30 tablet 5  . RABEprazole (ACIPHEX) 20 MG tablet Take 1 tablet (20 mg total)  by mouth daily. 30 tablet 5  . SUNMARK LORATADINE 10 MG tablet TAKE 1 TABLET (10 MG TOTAL) BY MOUTH DAILY. 30 tablet 1   No current facility-administered medications for this visit.      Allergies: No Known Allergies  Past Medical History, Surgical history, Social history, and Family History were reviewed and updated.  Review of Systems:    Physical Exam:   Blood pressure 121/83, pulse 95, temperature 98.4 F (36.9 C), temperature source Oral, resp. rate 18, height 5' (1.524 m), weight 147 lb 1.6 oz (66.7 kg), SpO2 99  %.      ECOG: 1   General appearance: Comfortable appearing without any discomfort Head: Normocephalic without any trauma Oropharynx: Mucous membranes are moist and pink without any thrush or ulcers. Eyes: Pupils are equal and round reactive to light. Lymph nodes: No cervical, supraclavicular, inguinal or axillary lymphadenopathy.   Heart:regular rate and rhythm.  S1 and S2 without leg edema. Lung: Clear without any rhonchi or wheezes.  No dullness to percussion. Abdomin: Soft, nontender, nondistended with good bowel sounds.  No hepatosplenomegaly. Musculoskeletal: No joint deformity or effusion.  Full range of motion noted. Neurological: No deficits noted on motor, sensory and deep tendon reflex exam. Skin: No petechial rash or dryness.  Appeared moist.  Psychiatric: Mood and affect appeared appropriate.        Lab Results: Lab Results  Component Value Date   WBC 4.5 06/26/2018   HGB 10.3 (L) 06/26/2018   HCT 34.5 (L) 06/26/2018   MCV 74.4 (L) 06/26/2018   PLT 86 (L) 06/26/2018     Chemistry      Component Value Date/Time   NA 140 06/26/2018 0847   K 4.1 06/26/2018 0847   CL 109 06/26/2018 0847   CO2 22 06/26/2018 0847   BUN 20 06/26/2018 0847   CREATININE 0.76 06/26/2018 0847      Component Value Date/Time   CALCIUM 9.6 06/26/2018 0847   ALKPHOS 86 06/26/2018 0847   AST 22 06/26/2018 0847   ALT 13 06/26/2018 0847   BILITOT 0.3 06/26/2018 0847     Impression and Plan:   48 year old woman with:  1.  Advanced melanoma of unknown primary diagnosed July 2019.  She presented with stage IV disease with peritoneal involvement, lymphadenopathy as well as CNS metastasis.     She continues to tolerate Pembrolizumab without any major complications.  Risks and benefits of long-term complications were reviewed at this time.  The plan is to proceed with therapy for the next 2 cycles and repeat imaging studies in February 2020.  She is agreeable at this time.   2.   CNS metastasis: She continues to receive surveillance MRI with the last one completed in November.  No evidence of recurrent disease at this time.  3.  Prognosis: Her performance status remains excellent and aggressive therapy is warranted although treatment is palliative.  4.  Psychosocial consideration: No recent issues at this time.  She continues to receive assistance for transportation as well as medication.  5.  Immune mediated complications: I reiterated some of the long-term complication associated with immunotherapy.  These include arthritis, pneumonitis, thyroid disease among others.  6.  Follow-up: We will be in 3 weeks for the next treatment.  25  minutes was spent with the patient face-to-face today.  More than 50% of time was spent on reviewing her disease status, treatment options and coordinating future plan of care.   Zola Button, MD 1/13/202010:55 AM

## 2018-07-17 NOTE — Patient Instructions (Signed)
[facility] Discharge Instructions for Patients Receiving Chemotherapy  Today you received the following chemotherapy agents :  Keytruda.  To help prevent nausea and vomiting after your treatment, we encourage you to take your nausea medication as prescribed.   If you develop nausea and vomiting that is not controlled by your nausea medication, call the clinic.   BELOW ARE SYMPTOMS THAT SHOULD BE REPORTED IMMEDIATELY:  *FEVER GREATER THAN 100.5 F  *CHILLS WITH OR WITHOUT FEVER  NAUSEA AND VOMITING THAT IS NOT CONTROLLED WITH YOUR NAUSEA MEDICATION  *UNUSUAL SHORTNESS OF BREATH  *UNUSUAL BRUISING OR BLEEDING  TENDERNESS IN MOUTH AND THROAT WITH OR WITHOUT PRESENCE OF ULCERS  *URINARY PROBLEMS  *BOWEL PROBLEMS  UNUSUAL RASH Items with * indicate a potential emergency and should be followed up as soon as possible.  Feel free to call the clinic should you have any questions or concerns. The clinic phone number is [phone].  Please show the CHEMO ALERT CARD at check-in to the Emergency Department and triage nurse.  

## 2018-07-26 ENCOUNTER — Telehealth: Payer: Self-pay | Admitting: *Deleted

## 2018-07-26 NOTE — Telephone Encounter (Signed)
[place] Work  Clinical Social Work received voicemail from patient stating she received a letter her Medicaid would be terminated.  Patient's Medicaid most likely being terminated due to receiving SSDI.  CSW explained this situation and encouraged patient to contact Medicaid caseworker to determine if this is the case and if there are any ways to appeal or reapply. CSW also provided information on [place] to determine other coverage options.  CSW sent patient the following information by email at patient's request:  [place] or [place] - [phone]  partnership w [employer], funded by a grant associated w the [place].  They have navigators who will meet w people one on one to find the best option for ACA coverage for their situation.  If you try to enroll online only, you may not get all the info about additional resources that might be available to lower the cost of a policy.     [name], LCSW  Clinical Social Worker [facility]

## 2018-07-27 ENCOUNTER — Telehealth: Payer: Self-pay

## 2018-07-27 ENCOUNTER — Other Ambulatory Visit: Payer: Self-pay | Admitting: Medical

## 2018-07-27 MED ORDER — AMOXICILLIN-POT CLAVULANATE 875-125 MG PO TABS: 1.0000 | ORAL_TABLET | Freq: Two times a day (BID) | ORAL | 0 refills | Status: DC

## 2018-07-27 MED FILL — AMOX-CLAV 875-125 MG TABLET: 875-125 | 7 days supply | Qty: 14 | Fill #0

## 2018-07-27 NOTE — Telephone Encounter (Signed)
Patient called to stated that she has been sneezing, congestion, and cough that is occasionally productive with green phlegm. She has not had a fever and has been eating and drinking plenty of fluids. She has been taking Zyrtec which has helped but is concerned that the congestion is turning into a sinus infection since this has happened in the past and she wanted to know if she needs to see Sandi Mealy [facility] PA since she does not have a PCP. Spoke with Sandi Mealy and he e-prescribed Augmentin to patient's pharmacy. Contacted patient and made aware of new prescription. Patient was appreciative and had no other questions or concerns.

## 2018-08-07 ENCOUNTER — Inpatient Hospital Stay (HOSPITAL_BASED_OUTPATIENT_CLINIC_OR_DEPARTMENT_OTHER): Payer: Medicaid Other | Admitting: Oncology

## 2018-08-07 ENCOUNTER — Inpatient Hospital Stay: Payer: Medicaid Other

## 2018-08-07 ENCOUNTER — Telehealth: Payer: Self-pay

## 2018-08-07 ENCOUNTER — Inpatient Hospital Stay: Payer: Medicaid Other | Attending: Obstetrics and Gynecology

## 2018-08-07 VITALS — BP 123/80 | HR 87 | Temp 98.5°F | Resp 18 | Ht 61.0 in | Wt 146.3 lb

## 2018-08-07 DIAGNOSIS — C786 Secondary malignant neoplasm of retroperitoneum and peritoneum: Secondary | ICD-10-CM | POA: Diagnosis not present

## 2018-08-07 DIAGNOSIS — C439 Malignant melanoma of skin, unspecified: Secondary | ICD-10-CM | POA: Insufficient documentation

## 2018-08-07 DIAGNOSIS — D649 Anemia, unspecified: Secondary | ICD-10-CM | POA: Diagnosis not present

## 2018-08-07 DIAGNOSIS — Z79899 Other long term (current) drug therapy: Secondary | ICD-10-CM | POA: Insufficient documentation

## 2018-08-07 DIAGNOSIS — Z5112 Encounter for antineoplastic immunotherapy: Secondary | ICD-10-CM | POA: Diagnosis not present

## 2018-08-07 DIAGNOSIS — C7931 Secondary malignant neoplasm of brain: Secondary | ICD-10-CM | POA: Diagnosis not present

## 2018-08-07 DIAGNOSIS — C7949 Secondary malignant neoplasm of other parts of nervous system: Secondary | ICD-10-CM | POA: Insufficient documentation

## 2018-08-07 DIAGNOSIS — C7989 Secondary malignant neoplasm of other specified sites: Secondary | ICD-10-CM

## 2018-08-07 LAB — CBC WITH DIFFERENTIAL (CANCER CENTER ONLY)
Abs Immature Granulocytes: 0.01 10*3/uL (ref 0.00–0.07)
Basophils Absolute: 0 10*3/uL (ref 0.0–0.1)
Basophils Relative: 0 %
EOS ABS: 0 10*3/uL (ref 0.0–0.5)
Eosinophils Relative: 0 %
HCT: 34.7 % — ABNORMAL LOW (ref 36.0–46.0)
Hemoglobin: 10.3 g/dL — ABNORMAL LOW (ref 12.0–15.0)
Immature Granulocytes: 0 %
Lymphocytes Relative: 17 %
Lymphs Abs: 0.8 10*3/uL (ref 0.7–4.0)
MCH: 22.2 pg — ABNORMAL LOW (ref 26.0–34.0)
MCHC: 29.7 g/dL — ABNORMAL LOW (ref 30.0–36.0)
MCV: 74.6 fL — ABNORMAL LOW (ref 80.0–100.0)
MONOS PCT: 11 %
Monocytes Absolute: 0.5 10*3/uL (ref 0.1–1.0)
Neutro Abs: 3.4 10*3/uL (ref 1.7–7.7)
Neutrophils Relative %: 72 %
PLATELETS: 94 10*3/uL — AB (ref 150–400)
RBC: 4.65 MIL/uL (ref 3.87–5.11)
RDW: 14.8 % (ref 11.5–15.5)
WBC Count: 4.7 10*3/uL (ref 4.0–10.5)
nRBC: 0 % (ref 0.0–0.2)

## 2018-08-07 LAB — CMP (CANCER CENTER ONLY)
ALK PHOS: 80 U/L (ref 38–126)
ALT: 9 U/L (ref 0–44)
AST: 15 U/L (ref 15–41)
Albumin: 4 g/dL (ref 3.5–5.0)
Anion gap: 8 (ref 5–15)
BILIRUBIN TOTAL: 0.3 mg/dL (ref 0.3–1.2)
BUN: 17 mg/dL (ref 6–20)
CALCIUM: 9.6 mg/dL (ref 8.9–10.3)
CO2: 23 mmol/L (ref 22–32)
CREATININE: 0.74 mg/dL (ref 0.44–1.00)
Chloride: 107 mmol/L (ref 98–111)
GFR, Est AFR Am: >60 mL/min (ref >60)
GFR, Estimated: >60 mL/min (ref >60)
Glucose, Bld: 143 mg/dL — ABNORMAL HIGH (ref 70–99)
Potassium: 3.8 mmol/L (ref 3.5–5.1)
Sodium: 138 mmol/L (ref 135–145)
TOTAL PROTEIN: 7.1 g/dL (ref 6.5–8.1)

## 2018-08-07 MED ORDER — SODIUM CHLORIDE 0.9 % IV SOLN
Freq: Once | INTRAVENOUS | Status: AC
  Administered 2018-08-07: 10:00:00 via INTRAVENOUS
  Filled 2018-08-07: qty 250

## 2018-08-07 MED ORDER — SODIUM CHLORIDE 0.9 % IV SOLN
200.0000 mg | Freq: Once | INTRAVENOUS | Status: AC
  Administered 2018-08-07: 200 mg via INTRAVENOUS
  Filled 2018-08-07: qty 8

## 2018-08-07 NOTE — Progress Notes (Signed)
Per Dr. Alen Blew, ok to tx with platelets 94.

## 2018-08-07 NOTE — Telephone Encounter (Signed)
Printed avs and calender of upcoming appointment. Per 2/3 los 

## 2018-08-07 NOTE — Patient Instructions (Signed)
[facility] Discharge Instructions for Patients Receiving Chemotherapy  Today you received the following chemotherapy agents :  Keytruda.  To help prevent nausea and vomiting after your treatment, we encourage you to take your nausea medication as prescribed.   If you develop nausea and vomiting that is not controlled by your nausea medication, call the clinic.   BELOW ARE SYMPTOMS THAT SHOULD BE REPORTED IMMEDIATELY:  *FEVER GREATER THAN 100.5 F  *CHILLS WITH OR WITHOUT FEVER  NAUSEA AND VOMITING THAT IS NOT CONTROLLED WITH YOUR NAUSEA MEDICATION  *UNUSUAL SHORTNESS OF BREATH  *UNUSUAL BRUISING OR BLEEDING  TENDERNESS IN MOUTH AND THROAT WITH OR WITHOUT PRESENCE OF ULCERS  *URINARY PROBLEMS  *BOWEL PROBLEMS  UNUSUAL RASH Items with * indicate a potential emergency and should be followed up as soon as possible.  Feel free to call the clinic should you have any questions or concerns. The clinic phone number is [phone].  Please show the CHEMO ALERT CARD at check-in to the Emergency Department and triage nurse.  

## 2018-08-07 NOTE — Progress Notes (Signed)
Hematology and Oncology Follow Up Visit  Kaitlyn Duncan [id number] 10-05-1970 48 y.o. 08/07/2018 9:10 AM Patient, No Pcp [name], MD   Principle Diagnosis: 48 year old woman with melanoma diagnosed in July 2019.  She presented with BRAF positive, stage IV disease with peritoneal and lung involvement.    Prior Therapy: He is status post subcutaneous nodule biopsy on 01/27/2018 which confirmed the presence of metastatic melanoma. She status post endoscopy on 01/28/2018 which showed subcutaneous involvement that consistent with metastatic melanoma.  Whole brain radiation for total of 30 Gy in 10 fractions started on 02/07/2018.   Mektovi 45 mg bid, [name] 450 mg daily started in August 2019.  Therapy discontinued in November 2019 after partial response.  Current therapy: Pembrolizumab 200 mg every 3 weeks started on 06/05/2018.  She is here for cycle 3 of therapy.    Interim History: Kaitlyn Duncan returns today for a repeat evaluation.  Since the last visit, she continues to tolerate Pembrolizumab without any new complaints.  She denies any nausea or infusion related complications.  She does report dry skin and occasional pruritus but no rash.  She denies any change in her bowel habits or respiratory complaints.  Her performance status and quality of life remains excellent.  Patient denied any alteration mental status, neuropathy, confusion or dizziness.  Denies any headaches or lethargy.  Denies any night sweats, weight loss or changes in appetite.  Denied orthopnea, dyspnea on exertion or chest discomfort.  Denies shortness of breath, difficulty breathing hemoptysis or cough.  Denies any abdominal distention, nausea, early satiety or dyspepsia.  Denies any hematuria, frequency, dysuria or nocturia.  Denies any skin irritation, dryness or rash.  Denies any ecchymosis or petechiae.  Denies any lymphadenopathy or clotting.  Denies any heat or cold intolerance.  Denies any anxiety or  depression.  Remaining review of system is negative.     Medications: I have reviewed the patient's current medications.  Current Outpatient Medications  Medication Sig Dispense Refill  . acetaminophen (TYLENOL) 325 MG tablet Take 650 mg by mouth every 6 (six) hours as needed.    Marland Kitchen amoxicillin-clavulanate (AUGMENTIN) 875-125 MG tablet Take 1 tablet by mouth 2 (two) times daily. 14 tablet 0  . [name] 75 MG CAPS TAKE 6 CAPSULES (450 MG) BY MOUTH ONCE DAILY 180 capsule 0  . levETIRAcetam (KEPPRA) 1000 MG tablet TAKE 1 TABLET (1,000 MG TOTAL) BY MOUTH 2 (TWO) TIMES DAILY. 60 tablet 2  . magic mouthwash SOLN Take 5 mLs by mouth 4 (four) times daily as needed for mouth pain. 250 mL 2  . MEKTOVI 15 MG TABS TAKE 3 TABLETS (45 MG) BY MOUTH TWICE A DAY 180 tablet 0  . metoCLOPramide (REGLAN) 5 MG tablet TAKE 1 TABLET BY MOUTH FOUR TIMES DAILY 120 tablet 0  . Multiple Vitamin (MULTIVITAMIN WITH MINERALS) TABS tablet Take 1 tablet by mouth daily.    . mupirocin ointment (BACTROBAN) 2 % Place 1 application into the nose 2 (two) times daily. 30 g 1  . ondansetron (ZOFRAN) 8 MG tablet Take 1 tablet (8 mg total) by mouth every 8 (eight) hours as needed for nausea or vomiting. (Patient not taking: Reported on 03/30/2018) 20 tablet 0  . oxybutynin (DITROPAN XL) 10 MG 24 hr tablet Take 1 tablet (10 mg total) by mouth at bedtime. 30 tablet 5  . RABEprazole (ACIPHEX) 20 MG tablet Take 1 tablet (20 mg total) by mouth daily. 30 tablet 5  . SUNMARK LORATADINE 10 MG tablet  TAKE 1 TABLET (10 MG TOTAL) BY MOUTH DAILY. (Patient not taking: Reported on 07/17/2018) 30 tablet 1   No current facility-administered medications for this visit.      Allergies: No Known Allergies  Past Medical History, Surgical history, Social history, and Family History were reviewed and updated.  Review of Systems:    Physical Exam:   Blood pressure 123/80, pulse 87, temperature 98.5 F (36.9 C), temperature source Oral, resp.  rate 18, height 5' 1" (1.549 m), weight 146 lb 4.8 oz (66.4 kg), SpO2 98 %.      ECOG: 1   General appearance: Alert, awake without any distress. Head: Atraumatic without abnormalities Oropharynx: Without any thrush or ulcers. Eyes: No scleral icterus. Lymph nodes: No lymphadenopathy noted in the cervical, supraclavicular, or axillary nodes Heart:regular rate and rhythm, without any murmurs or gallops.   Lung: Clear to auscultation without any rhonchi, wheezes or dullness to percussion. Abdomin: Soft, nontender without any shifting dullness or ascites. Musculoskeletal: No clubbing or cyanosis. Neurological: No motor or sensory deficits. Skin: Dry without any lesions or rashes.         Lab Results: Lab Results  Component Value Date   WBC 4.7 08/07/2018   HGB 10.3 (L) 08/07/2018   HCT 34.7 (L) 08/07/2018   MCV 74.6 (L) 08/07/2018   PLT 94 (L) 08/07/2018     Chemistry      Component Value Date/Time   NA 141 07/17/2018 1046   K 4.0 07/17/2018 1046   CL 109 07/17/2018 1046   CO2 24 07/17/2018 1046   BUN 19 07/17/2018 1046   CREATININE 0.77 07/17/2018 1046      Component Value Date/Time   CALCIUM 9.7 07/17/2018 1046   ALKPHOS 93 07/17/2018 1046   AST 22 07/17/2018 1046   ALT 11 07/17/2018 1046   BILITOT 0.5 07/17/2018 1046     Impression and Plan:   48 year old woman with:  1.  Stage IV melanoma diagnosed in July 2019.  She has involvement of the brain, lymphadenopathy and peritoneal disease.      She has tolerated Pembrolizumab without any new complications.  Risks and benefits of continuing therapy was discussed today.  Long-term complications associated with this treatment were also reviewed.  Alternative options were also discussed.  At this time we will continue with this therapy and repeat imaging studies in March 2020.  2.  CNS metastasis: MRI scheduled in the next few days.  She has no clinical signs or symptoms of disease progression.  3.   Prognosis: Therapy remains palliative although her disease is under excellent control with good performance status.  4.  Psychosocial consideration: She seems to be managing and coping reasonably well.  Is appropriate resources at this time.  5.  Immune mediated complications: Complication associated with immunotherapy such as pneumonitis, colitis and thyroid disease were discussed.  She is not exhibiting any complications at this time.  6.  Anemia: She has element of microcytosis.  I will recheck her iron studies and replace as needed.  Anemia could be related to malignancy and chronic disease.  7.  Follow-up: We will be in 3 weeks for repeat evaluation and next cycle of Pembrolizumab.  25  minutes was spent with the patient face-to-face today.  More than 50% of time was spent on discussing the natural course of her disease, laboratory data, management options and dealing with complications related to therapy.   Zola Button, MD 2/3/20209:10 AM

## 2018-08-09 ENCOUNTER — Other Ambulatory Visit: Payer: Self-pay | Admitting: Radiation Therapy

## 2018-08-10 ENCOUNTER — Ambulatory Visit
Admission: RE | Admit: 2018-08-10 | Discharge: 2018-08-10 | Disposition: A | Discharge status: Home / Self Care | Payer: Medicaid Other | Source: Ambulatory Visit | Attending: Radiation Oncology | Admitting: Radiation Oncology

## 2018-08-10 DIAGNOSIS — C7949 Secondary malignant neoplasm of other parts of nervous system: Principal | ICD-10-CM

## 2018-08-10 DIAGNOSIS — C7931 Secondary malignant neoplasm of brain: Secondary | ICD-10-CM

## 2018-08-10 MED ORDER — GADOBENATE DIMEGLUMINE 529 MG/ML IV SOLN
13.0000 mL | Freq: Once | INTRAVENOUS | Status: AC | PRN
  Administered 2018-08-10: 13 mL via INTRAVENOUS

## 2018-08-14 ENCOUNTER — Inpatient Hospital Stay: Payer: Medicaid Other

## 2018-08-15 ENCOUNTER — Encounter: Payer: Self-pay | Admitting: Urology

## 2018-08-15 ENCOUNTER — Other Ambulatory Visit: Payer: Self-pay

## 2018-08-15 ENCOUNTER — Ambulatory Visit
Admission: RE | Admit: 2018-08-15 | Discharge: 2018-08-15 | Disposition: A | Discharge status: Home / Self Care | Payer: Medicaid Other | Source: Ambulatory Visit | Attending: Urology | Admitting: Urology

## 2018-08-15 VITALS — BP 120/80 | HR 75 | Temp 98.6°F | Resp 18 | Ht 61.0 in | Wt 144.6 lb

## 2018-08-15 DIAGNOSIS — C7931 Secondary malignant neoplasm of brain: Secondary | ICD-10-CM

## 2018-08-15 DIAGNOSIS — C7949 Secondary malignant neoplasm of other parts of nervous system: Principal | ICD-10-CM

## 2018-08-15 NOTE — Progress Notes (Signed)
Radiation Oncology         [phone]) [phone] ________________________________  Name: Kaitlyn Duncan MRN: [id number]  Date: 08/15/2018  DOB: 10/19/1970  Post Treatment Note  CC: Patient, No Pcp Per  Marliss Coots, NP  Diagnosis:   48 yo woman with over 50 brain metastases fromstage IVmelanoma  Interval Since Last Radiation:  6 months 02/06/2018 to 02/17/2018:   The whole brain was treated to 30 Gy in 10 fractions of 3 Gy  Narrative:  The patient returns today for routine follow-up and to discuss results from her recent follow up MRI. Her scan from 08/10/18 shows a stable appearance on her MRI with numerous small metastatic deposits throughout the brain which are stable to improved and numerous lesions throughout the scalp which have also improved from the original study.  Her case was reviewed in the multidisciplinary brain conference on 08/14/18 and she is here to review the results and recommendations. She continues on Pembrolizumab 200 mg every 3 weeks under the care and direction of Dr. Alen Blew- started 06/05/18 and reports that she tolerates this well.   On review of systems, the patient states that she is doing well overall.  She has not had any significant headaches, tinnitus, dizziness/imbalance, tremor or seizure activity.  She denies chest pain, shortness of breath, wheezing, cough or hemoptysis.  She has continued taking Keppra as prescribed but ran out of this medication 1 week ago and was unsure whether she was supposed to continue taking this or not.  She denies any seizure activity. She reports improved appetite and is maintaining her weight.  She denies N/V, constipation or diarrhea.  She has noticed improvement in her nocturia since starting Oxybutynin XL 10 mg qhs.   She denies dysuria, gross hematuria, increased daytime frequency, urgency or incomplete emptying. She had continued taking systemic chemotherapy under the care and direction of Dr. Alen Blew with Mektovi 45 mg bid and  [name] 450 mg daily- started on March 02, 2018 but this was discontinued in November 2019 after partial response.  She was started on Pembrolizumab 200 mg every 3 weeks- started 06/05/18 and she reports that she tolerates this well. She was last seen by Dr. Alen Blew on 08/07/18 and the plan is to repeat systemic imaging in March 2020.  She has noted significant improvement, in fact, almost resolution of the palpable scalp lesions which she is quite pleased with. She has mild itching and dryness of her scalp.  She is accompanied today by her father and step-mother.  ALLERGIES:  has No Known Allergies.  Meds: Current Outpatient Medications  Medication Sig Dispense Refill  . acetaminophen (TYLENOL) 325 MG tablet Take 650 mg by mouth every 6 (six) hours as needed.    . Alpha-D-Galactosidase (BEANO PO) Take 2 tablets by mouth 3 (three) times daily. Patient takes 2 tablets three times a day with meals to help with gas and indigestion.    . metoCLOPramide (REGLAN) 5 MG tablet TAKE 1 TABLET BY MOUTH FOUR TIMES DAILY 120 tablet 0  . Multiple Vitamin (MULTIVITAMIN WITH MINERALS) TABS tablet Take 1 tablet by mouth daily.    . mupirocin ointment (BACTROBAN) 2 % Place 1 application into the nose 2 (two) times daily. 30 g 1  . oxybutynin (DITROPAN XL) 10 MG 24 hr tablet Take 1 tablet (10 mg total) by mouth at bedtime. 30 tablet 5  . [name] 75 MG CAPS TAKE 6 CAPSULES (450 MG) BY MOUTH ONCE DAILY 180 capsule 0  . levETIRAcetam (KEPPRA)  1000 MG tablet TAKE 1 TABLET (1,000 MG TOTAL) BY MOUTH 2 (TWO) TIMES DAILY. (Patient not taking: Reported on 08/15/2018) 60 tablet 2  . magic mouthwash SOLN Take 5 mLs by mouth 4 (four) times daily as needed for mouth pain. 250 mL 2  . MEKTOVI 15 MG TABS TAKE 3 TABLETS (45 MG) BY MOUTH TWICE A DAY 180 tablet 0  . ondansetron (ZOFRAN) 8 MG tablet Take 1 tablet (8 mg total) by mouth every 8 (eight) hours as needed for nausea or vomiting. (Patient not taking: Reported on 03/30/2018) 20  tablet 0  . RABEprazole (ACIPHEX) 20 MG tablet Take 1 tablet (20 mg total) by mouth daily. 30 tablet 5   No current facility-administered medications for this encounter.     Physical Findings:  height is 5\' 1"  (1.549 m) and weight is 144 lb 9.6 oz (65.6 kg). Her oral temperature is 98.6 F (37 C). Her blood pressure is 120/80 and her pulse is 75. Her respiration is 18 and oxygen saturation is 99%.  Pain Assessment Pain Score: 0-No pain/10 In general this is a well appearing Caucasian female in no acute distress. She's alert and oriented x4 and appropriate throughout the examination. Cardiopulmonary assessment is negative for acute distress and She exhibits normal effort. She appears grossly neurologically intact and is ambulating with steady gait without the assistance of a walker or cane. EOMs intact bilaterally and PERRLA. Patchy alopecia is noted with only a very few visible/palpable scalp lesions remaining.  Scalp is non tender to palpation and no evidence of skin irritation or desquamation. Sensation is intact to light touch and strength is 5/5 and equal in bilateral upper and lower extremities.   Lab Findings: Lab Results  Component Value Date   WBC 4.7 08/07/2018   HGB 10.3 (L) 08/07/2018   HCT 34.7 (L) 08/07/2018   MCV 74.6 (L) 08/07/2018   PLT 94 (L) 08/07/2018     Radiographic Findings: Mr [name] LN Contrast  Result Date: 08/10/2018 CLINICAL DATA:  Metastatic melanoma.  Post whole-brain radiation EXAM: MRI HEAD WITHOUT AND WITH CONTRAST TECHNIQUE: Multiplanar, multiecho pulse sequences of the brain and surrounding structures were obtained without and with intravenous contrast. CONTRAST:  43mL MULTIHANCE GADOBENATE DIMEGLUMINE 529 MG/ML IV SOLN COMPARISON:  MRI head 05/11/2018 FINDINGS: Brain: Ventricle size normal. Mild changes in the white matter. No significant white matter edema or mass-effect. Negative for acute infarct. Numerous tiny metastatic deposits in the brain are  widely distributed throughout both cerebral hemispheres. Nearly all of these show susceptibility due to melanin or hemorrhage. Many of the lesions over the convexity measure 1-2 mm. Largest lesion in the right occipital lobe is less than 1 cm. The lesions appear similar or improved from the prior study. No new or enlarging lesions. Vascular: Normal arterial flow voids Skull and upper cervical spine: No skull lesion. Numerous scalp and soft tissue lesions are markedly improved and also have susceptibility Sinuses/Orbits: Mucosal edema paranasal sinuses most prominent left maxillary sinus. No orbital mass lesion. Other: None IMPRESSION: Stable MRI. Numerous small metastatic deposits throughout the brain are stable to improved. Most of these show susceptibility consistent with melanin or hemorrhage. Numerous lesions throughout the scalp also have improved from the original study Electronically Signed   By: Franchot Gallo M.D.   On: 08/10/2018 14:33    Impression/Plan: 1. 48 yo woman with over 50 brain metastases fromstage IVmelanoma. She appears to have recovered well from the effects of her recent radiotherapy.  She has  continued taking Keppra as prescribed until she ran out of medication one week ago.  She has not had any seizure activity and questions whether she needs to remain on this medication.  We reviewed her recent MRI brain scan which shows a good response to treatment with a decrease in size of punctate enhancing lesions throughout both cerebral hemispheres and no new lesions noted.  Her case was presented and reviewed at recent multidisciplinary brain conference and consensus recommendation is to repeat an  MRI brain scan in 3 months to continue to monitor her response to treatment and rule out any disease progression or recurrence.  We will continue to monitor closely with serial MRI brain scans every 3 months going forward.  She will also continue in routine follow-up with Dr. Alen Blew for continued  disease management with systemic therapy under his care and direction.  I am going to make a referral for consult with Dr. Mickeal Skinner regarding recommendations for continuing Keppra. Until that time, I will have her continue daily Keppra and have sent a refill to the [facility] outpatient pharmacy. She appears to have a good understanding of her disease and our recommendations and is in agreement with the stated plan.  She knows to call at any time in the interim with any questions or concerns.  3. Nocturia.  Improved on Oxybutynin ER 10mg  po qhs.  We will continue to monitor success at upcoming visits.     Nicholos Johns, PA-C

## 2018-08-17 ENCOUNTER — Other Ambulatory Visit: Payer: Self-pay | Admitting: Urology

## 2018-08-17 MED ORDER — LEVETIRACETAM 1000 MG PO TABS: 1000.0000 mg | ORAL_TABLET | Freq: Two times a day (BID) | ORAL | 2 refills | Status: DC

## 2018-08-22 ENCOUNTER — Inpatient Hospital Stay: Payer: Medicaid Other | Admitting: Internal Medicine

## 2018-08-25 ENCOUNTER — Telehealth: Payer: Self-pay | Admitting: *Deleted

## 2018-08-25 NOTE — Telephone Encounter (Signed)
Called left message to reschedule missed appt, no answer, voicemail left.

## 2018-08-28 ENCOUNTER — Inpatient Hospital Stay (HOSPITAL_BASED_OUTPATIENT_CLINIC_OR_DEPARTMENT_OTHER): Payer: Medicaid Other | Admitting: Oncology

## 2018-08-28 ENCOUNTER — Inpatient Hospital Stay: Payer: Medicaid Other

## 2018-08-28 ENCOUNTER — Telehealth: Payer: Self-pay | Admitting: Oncology

## 2018-08-28 VITALS — BP 135/77 | HR 81 | Temp 99.0°F | Resp 18 | Ht 61.0 in | Wt 147.2 lb

## 2018-08-28 DIAGNOSIS — C7931 Secondary malignant neoplasm of brain: Secondary | ICD-10-CM | POA: Diagnosis not present

## 2018-08-28 DIAGNOSIS — C439 Malignant melanoma of skin, unspecified: Secondary | ICD-10-CM

## 2018-08-28 DIAGNOSIS — D649 Anemia, unspecified: Secondary | ICD-10-CM

## 2018-08-28 DIAGNOSIS — C7949 Secondary malignant neoplasm of other parts of nervous system: Secondary | ICD-10-CM

## 2018-08-28 DIAGNOSIS — Z5112 Encounter for antineoplastic immunotherapy: Secondary | ICD-10-CM | POA: Diagnosis not present

## 2018-08-28 DIAGNOSIS — Z79899 Other long term (current) drug therapy: Secondary | ICD-10-CM

## 2018-08-28 DIAGNOSIS — C786 Secondary malignant neoplasm of retroperitoneum and peritoneum: Secondary | ICD-10-CM

## 2018-08-28 LAB — CMP (CANCER CENTER ONLY)
ALK PHOS: 93 U/L (ref 38–126)
ALT: 9 U/L (ref 0–44)
AST: 18 U/L (ref 15–41)
Albumin: 4 g/dL (ref 3.5–5.0)
Anion gap: 10 (ref 5–15)
BUN: 17 mg/dL (ref 6–20)
CALCIUM: 9 mg/dL (ref 8.9–10.3)
CO2: 21 mmol/L — ABNORMAL LOW (ref 22–32)
Chloride: 109 mmol/L (ref 98–111)
Creatinine: 0.78 mg/dL (ref 0.44–1.00)
GFR, Est AFR Am: >60 mL/min (ref >60)
GFR, Estimated: >60 mL/min (ref >60)
Glucose, Bld: 146 mg/dL — ABNORMAL HIGH (ref 70–99)
Potassium: 3.7 mmol/L (ref 3.5–5.1)
Sodium: 140 mmol/L (ref 135–145)
TOTAL PROTEIN: 7 g/dL (ref 6.5–8.1)
Total Bilirubin: 0.4 mg/dL (ref 0.3–1.2)

## 2018-08-28 LAB — CBC WITH DIFFERENTIAL (CANCER CENTER ONLY)
Abs Immature Granulocytes: 0.01 10*3/uL (ref 0.00–0.07)
Basophils Absolute: 0 10*3/uL (ref 0.0–0.1)
Basophils Relative: 0 %
Eosinophils Absolute: 0 10*3/uL (ref 0.0–0.5)
Eosinophils Relative: 0 %
HCT: 35.3 % — ABNORMAL LOW (ref 36.0–46.0)
Hemoglobin: 10.6 g/dL — ABNORMAL LOW (ref 12.0–15.0)
Immature Granulocytes: 0 %
Lymphocytes Relative: 19 %
Lymphs Abs: 0.7 10*3/uL (ref 0.7–4.0)
MCH: 22.6 pg — ABNORMAL LOW (ref 26.0–34.0)
MCHC: 30 g/dL (ref 30.0–36.0)
MCV: 75.4 fL — ABNORMAL LOW (ref 80.0–100.0)
Monocytes Absolute: 0.5 10*3/uL (ref 0.1–1.0)
Monocytes Relative: 14 %
Neutro Abs: 2.5 10*3/uL (ref 1.7–7.7)
Neutrophils Relative %: 67 %
Platelet Count: 98 10*3/uL — ABNORMAL LOW (ref 150–400)
RBC: 4.68 MIL/uL (ref 3.87–5.11)
RDW: 15 % (ref 11.5–15.5)
WBC Count: 3.6 10*3/uL — ABNORMAL LOW (ref 4.0–10.5)
nRBC: 0 % (ref 0.0–0.2)

## 2018-08-28 LAB — IRON AND TIBC
Iron: 24 ug/dL — ABNORMAL LOW (ref 41–142)
Saturation Ratios: 6 % — ABNORMAL LOW (ref 21–57)
TIBC: 378 ug/dL (ref 236–444)
UIBC: 354 ug/dL (ref 120–384)

## 2018-08-28 LAB — FERRITIN: FERRITIN: 7 ng/mL — AB (ref 11–307)

## 2018-08-28 MED ORDER — SODIUM CHLORIDE 0.9 % IV SOLN
Freq: Once | INTRAVENOUS | Status: AC
  Administered 2018-08-28: 11:00:00 via INTRAVENOUS
  Filled 2018-08-28: qty 250

## 2018-08-28 MED ORDER — SODIUM CHLORIDE 0.9 % IV SOLN
200.0000 mg | Freq: Once | INTRAVENOUS | Status: AC
  Administered 2018-08-28: 200 mg via INTRAVENOUS
  Filled 2018-08-28: qty 8

## 2018-08-28 NOTE — Addendum Note (Signed)
Addended by: Randolm Idol on: 08/28/2018 10:29 AM   Modules accepted: Orders

## 2018-08-28 NOTE — Progress Notes (Signed)
Hematology and Oncology Follow Up Visit  Kaitlyn Duncan [id number] [birth year] 48 y.o. 08/28/2018 9:56 AM Patient, No Pcp Kaitlyn Saas, MD   Principle Diagnosis: 48 year old woman with stage IV melanoma with disease involvement of the peritoneum and the lung diagnosed in July 2019.  Her tumor is BRAF positive.  Prior Therapy: He is status post subcutaneous nodule biopsy on 01/27/2018 which confirmed the presence of metastatic melanoma. She status post endoscopy on 01/28/2018 which showed subcutaneous involvement that consistent with metastatic melanoma.  Whole brain radiation for total of 30 Gy in 10 fractions started on 02/07/2018.   Mektovi 45 mg bid, [name] 450 mg daily started in August 2019.  Therapy discontinued in November 2019 after partial response.  Current therapy: Pembrolizumab 200 mg every 3 weeks started on 06/05/2018.  She is here for cycle 4 of therapy.    Interim History: Kaitlyn Duncan is here for a follow-up visit.  Since her last visit, she reports no major changes in her health.  She continues to tolerate Pembrolizumab without any new complaints.  She denies any excessive fatigue, tiredness or changes in her bowels.  She denies respiratory complaints.  She denies any joint pain or tenderness.  Her performance status quality of life remain excellent.  He has been off Keppra which have helped her overall health and performance status.  She denies any changes in her mentation or seizures.   Patient denied headaches, blurry vision, or syncope.  Denies any fevers, chills or sweats.  Denied chest pain, palpitation, orthopnea or leg edema.  Denied cough, wheezing or hemoptysis.  Denied nausea, vomiting or abdominal pain.  Denies any constipation or diarrhea.  Denies any frequency urgency or hesitancy.  Denies any arthralgias or myalgias.  Denies any skin rashes or lesions.  Denies any bleeding or clotting tendency.  Denies any easy bruising.  Denies any hair or nail changes.   Denies any anxiety or depression.  Remaining review of system is negative.     Medications: I have reviewed the patient's current medications.  Current Outpatient Medications  Medication Sig Dispense Refill  . acetaminophen (TYLENOL) 325 MG tablet Take 650 mg by mouth every 6 (six) hours as needed.    . Alpha-D-Galactosidase (BEANO PO) Take 2 tablets by mouth 3 (three) times daily. Patient takes 2 tablets three times a day with meals to help with gas and indigestion.    Marland Kitchen [name] 75 MG CAPS TAKE 6 CAPSULES (450 MG) BY MOUTH ONCE DAILY 180 capsule 0  . levETIRAcetam (KEPPRA) 1000 MG tablet Take 1 tablet (1,000 mg total) by mouth 2 (two) times daily. 60 tablet 2  . magic mouthwash SOLN Take 5 mLs by mouth 4 (four) times daily as needed for mouth pain. 250 mL 2  . MEKTOVI 15 MG TABS TAKE 3 TABLETS (45 MG) BY MOUTH TWICE A DAY 180 tablet 0  . metoCLOPramide (REGLAN) 5 MG tablet TAKE 1 TABLET BY MOUTH FOUR TIMES DAILY 120 tablet 0  . Multiple Vitamin (MULTIVITAMIN WITH MINERALS) TABS tablet Take 1 tablet by mouth daily.    . mupirocin ointment (BACTROBAN) 2 % Place 1 application into the nose 2 (two) times daily. 30 g 1  . ondansetron (ZOFRAN) 8 MG tablet Take 1 tablet (8 mg total) by mouth every 8 (eight) hours as needed for nausea or vomiting. (Patient not taking: Reported on 03/30/2018) 20 tablet 0  . oxybutynin (DITROPAN XL) 10 MG 24 hr tablet Take 1 tablet (10 mg total) by mouth at  bedtime. 30 tablet 5  . RABEprazole (ACIPHEX) 20 MG tablet Take 1 tablet (20 mg total) by mouth daily. 30 tablet 5   No current facility-administered medications for this visit.      Allergies: No Known Allergies  Past Medical History, Surgical history, Social history, and Family History were reviewed and updated.  Review of Systems:    Physical Exam:    Blood pressure 135/77, pulse 81, temperature 99 F (37.2 C), temperature source Oral, resp. rate 18, height '5\' 1"'$  (1.549 m), weight 147 lb 3.2 oz  (66.8 kg), SpO2 99 %.     ECOG: 1   General appearance: Comfortable appearing without any discomfort Head: Normocephalic without any trauma Oropharynx: Mucous membranes are moist and pink without any thrush or ulcers. Eyes: Pupils are equal and round reactive to light. Lymph nodes: No cervical, supraclavicular, inguinal or axillary lymphadenopathy.   Heart:regular rate and rhythm.  S1 and S2 without leg edema. Lung: Clear without any rhonchi or wheezes.  No dullness to percussion. Abdomin: Soft, nontender, nondistended with good bowel sounds.  No hepatosplenomegaly. Musculoskeletal: No joint deformity or effusion.  Full range of motion noted. Neurological: No deficits noted on motor, sensory and deep tendon reflex exam. Skin: No petechial rash or dryness.  Appeared moist.           Lab Results: Lab Results  Component Value Date   WBC 3.6 (L) 08/28/2018   HGB 10.6 (L) 08/28/2018   HCT 35.3 (L) 08/28/2018   MCV 75.4 (L) 08/28/2018   PLT 98 (L) 08/28/2018     Chemistry      Component Value Date/Time   NA 138 08/07/2018 0842   K 3.8 08/07/2018 0842   CL 107 08/07/2018 0842   CO2 23 08/07/2018 0842   BUN 17 08/07/2018 0842   CREATININE 0.74 08/07/2018 0842      Component Value Date/Time   CALCIUM 9.6 08/07/2018 0842   ALKPHOS 80 08/07/2018 0842   AST 15 08/07/2018 0842   ALT 9 08/07/2018 0842   BILITOT 0.3 08/07/2018 0842     Impression and Plan:   48 year old woman with:  1.  Melanoma presented in July 2019 found to have stage IV disease with brain, lymphadenopathy and peritoneal disease.      She remains on Pembrolizumab without any major complications.  Risks and benefits of continuing this treatment and long-term complications were reiterated.  After discussion today she is agreeable to continue I will repeat imaging studies in March 2020.  He is agreeable to proceed.  The duration of therapy was discussed today in detail.  This will be determined by the  CT scan results.  My preference is to treat to disease progression or intolerance.  2.  CNS metastasis: MRI of the brain on August 10, 2018 showed stable disease.  Continue to have surveillance MRI in the future  3.  Prognosis: Her disease is incurable although performance status is excellent and aggressive therapy is warranted.  4.  Psychosocial consideration: Continue to manage reasonably well at this time.  5.  Immune mediated complications: I continue to educate her about complication associated with immunotherapy.  These would include colitis, pneumonitis, thyroid disease among others.  6.  Anemia: Hemoglobin remains stable at this time and does not require any transfusion.  7.  Follow-up: In 3 weeks for the next cycle of Pembrolizumab  25  minutes was spent with the patient face-to-face today.  More than 50% of time was dedicated reviewing her disease  status, laboratory data and answering question regarding future plan of care.   Zola Button, MD 2/24/20209:56 AM

## 2018-08-28 NOTE — Telephone Encounter (Signed)
Scheduled appt per 2/24 los. ° °Printed calendar and avs. °

## 2018-08-28 NOTE — Patient Instructions (Signed)
[facility] Discharge Instructions for Patients Receiving Chemotherapy  Today you received the following chemotherapy agents:  Keytruda.  To help prevent nausea and vomiting after your treatment, we encourage you to take your nausea medication as directed.   If you develop nausea and vomiting that is not controlled by your nausea medication, call the clinic.   BELOW ARE SYMPTOMS THAT SHOULD BE REPORTED IMMEDIATELY:  *FEVER GREATER THAN 100.5 F  *CHILLS WITH OR WITHOUT FEVER  NAUSEA AND VOMITING THAT IS NOT CONTROLLED WITH YOUR NAUSEA MEDICATION  *UNUSUAL SHORTNESS OF BREATH  *UNUSUAL BRUISING OR BLEEDING  TENDERNESS IN MOUTH AND THROAT WITH OR WITHOUT PRESENCE OF ULCERS  *URINARY PROBLEMS  *BOWEL PROBLEMS  UNUSUAL RASH Items with * indicate a potential emergency and should be followed up as soon as possible.  Feel free to call the clinic should you have any questions or concerns. The clinic phone number is [phone].  Please show the CHEMO ALERT CARD at check-in to the Emergency Department and triage nurse.    

## 2018-08-31 MED FILL — LEVETIRACETAM 1000 MG TABS: 1000 | 30 days supply | Qty: 60 | Fill #0

## 2018-09-06 ENCOUNTER — Inpatient Hospital Stay: Payer: Medicaid Other | Attending: Obstetrics and Gynecology | Admitting: Internal Medicine

## 2018-09-06 VITALS — BP 117/75 | HR 87 | Temp 97.8°F | Resp 18 | Ht 61.0 in | Wt 143.7 lb

## 2018-09-06 DIAGNOSIS — C7931 Secondary malignant neoplasm of brain: Secondary | ICD-10-CM

## 2018-09-06 DIAGNOSIS — E119 Type 2 diabetes mellitus without complications: Secondary | ICD-10-CM | POA: Diagnosis not present

## 2018-09-06 DIAGNOSIS — Z79899 Other long term (current) drug therapy: Secondary | ICD-10-CM | POA: Diagnosis not present

## 2018-09-06 DIAGNOSIS — Z5112 Encounter for antineoplastic immunotherapy: Secondary | ICD-10-CM | POA: Insufficient documentation

## 2018-09-06 DIAGNOSIS — C439 Malignant melanoma of skin, unspecified: Secondary | ICD-10-CM | POA: Diagnosis not present

## 2018-09-06 DIAGNOSIS — C786 Secondary malignant neoplasm of retroperitoneum and peritoneum: Secondary | ICD-10-CM | POA: Insufficient documentation

## 2018-09-06 DIAGNOSIS — C7949 Secondary malignant neoplasm of other parts of nervous system: Secondary | ICD-10-CM

## 2018-09-06 MED ORDER — FLUTICASONE PROPIONATE 50 MCG/ACT NA SUSP: 1.0000 | Freq: Every day | NASAL | 2 refills | Status: DC

## 2018-09-06 MED FILL — FLUTICASONE PROP 50 MCG SPR: 50 | 30 days supply | Qty: 16 | Fill #0

## 2018-09-06 NOTE — Progress Notes (Signed)
[facility] [place], [place] [address] [phone]   New Patient Evaluation  Date of Service: 09/06/18 Patient Name: Satara Virella Patient MRN: [id number] Patient DOB: 05/21/1971 Provider: Ventura Sellers, MD  Identifying Statement:  Clarity Ciszek is a 48 y.o. female with Brain metastasis ([place]) [C79.31] who presents for initial consultation and evaluation regarding cancer associated neurologic deficits.    Referring Provider: Wyatt Portela, MD [address] [place], [place] [address]  Primary Cancer:  Oncologic History:   Melanoma of skin ([place])   05/29/2018 Initial Diagnosis    Melanoma of skin ([place])    06/05/2018 -  Chemotherapy    The patient had pembrolizumab (KEYTRUDA) 200 mg in sodium chloride 0.9 % 50 mL chemo infusion, 200 mg, Intravenous, Once, 5 of 7 cycles Administration: 200 mg (06/05/2018), 200 mg (06/26/2018), 200 mg (07/17/2018), 200 mg (08/07/2018), 200 mg (08/28/2018)  for chemotherapy treatment.      History of Present Illness: The patient's records from the referring physician were obtained and reviewed and the patient interviewed to confirm this HPI.  Carlyon Shadow Hindley presents today due to her concern over her anti-epileptic drug regimen.  She has been taking Keppra 1000mg  twice per day since July 2019, when she was initially diagnosed with diffuse brain metastases secondary to melanoma.  She denies ever experiencing a seizure.  Her gait has been normal/steady, she has mild tension headaches on occasion only.  There is no history of epilepsy in the family, no prior neurotrauma.  Melanoma has been managed for ~1 year with keytruda monotherapy.  Medications: Current Outpatient Medications on File Prior to Visit  Medication Sig Dispense Refill  . acetaminophen (TYLENOL) 325 MG tablet Take 650 mg by mouth every 6 (six) hours as needed.    . cetirizine (ZYRTEC) 10 MG tablet Take 10 mg  by mouth as needed for allergies (for seasonal allergies).    Marland Kitchen levETIRAcetam (KEPPRA) 1000 MG tablet Take 1,000 mg by mouth 2 (two) times daily.    . Multiple Vitamin (MULTIVITAMIN WITH MINERALS) TABS tablet Take 1 tablet by mouth daily.    . Alpha-D-Galactosidase (BEANO PO) Take 2 tablets by mouth 3 (three) times daily. Patient takes 2 tablets three times a day with meals to help with gas and indigestion.    . mupirocin ointment (BACTROBAN) 2 % Place 1 application into the nose 2 (two) times daily. (Patient not taking: Reported on 09/06/2018) 30 g 1  . ondansetron (ZOFRAN) 8 MG tablet Take 1 tablet (8 mg total) by mouth every 8 (eight) hours as needed for nausea or vomiting. (Patient not taking: Reported on 03/30/2018) 20 tablet 0   No current facility-administered medications on file prior to visit.     Allergies: No Known Allergies Past Medical History:  Past Medical History:  Diagnosis Date  . Anemia   . Anemia   . Diabetes mellitus without complication (HCC)    prediabetic  . Seasonal allergies    Past Surgical History:  Past Surgical History:  Procedure Laterality Date  . BIOPSY  01/28/2018   Procedure: BIOPSY;  Surgeon: Laurence Spates, MD;  Location: [facility];  Service: Endoscopy;;  . ESOPHAGOGASTRODUODENOSCOPY N/A 01/28/2018   Procedure: ESOPHAGOGASTRODUODENOSCOPY (EGD);  Surgeon: Laurence Spates, MD;  Location: [facility];  Service: Endoscopy;  Laterality: N/A;  . MOUTH SURGERY    . OVARY SURGERY     removed "something"   Social History:  Social History  Socioeconomic History  . Marital status: Single    Spouse name: Not on file  . Number of children: Not on file  . Years of education: Not on file  . Highest education level: Not on file  Occupational History  . Not on file  Social Needs  . Financial resource strain: Not on file  . Food insecurity:    Worry: Not on file    Inability: Not on file  . Transportation needs:    Medical: Not on file     Non-medical: Not on file  Tobacco Use  . Smoking status: Never Smoker  . Smokeless tobacco: Never Used  Substance and Sexual Activity  . Alcohol use: No  . Drug use: No  . Sexual activity: Not Currently  Lifestyle  . Physical activity:    Days per week: Not on file    Minutes per session: Not on file  . Stress: Not on file  Relationships  . Social connections:    Talks on phone: Not on file    Gets together: Not on file    Attends religious service: Not on file    Active member of club or organization: Not on file    Attends meetings of clubs or organizations: Not on file    Relationship status: Not on file  . Intimate partner violence:    Fear of current or ex partner: No    Emotionally abused: No    Physically abused: No    Forced sexual activity: No  Other Topics Concern  . Not on file  Social History Narrative  . Not on file   Family History:  Family History  Problem Relation Age of Onset  . Hypertension Father   . Breast cancer Maternal Aunt   . Diabetes Maternal Aunt   . Breast cancer Paternal Aunt   . Breast cancer Maternal Grandmother   . Breast cancer Paternal Grandmother   . Diabetes Paternal Grandmother     Review of Systems: Constitutional: Denies fevers, chills or abnormal weight loss Eyes: Denies blurriness of vision Ears, nose, mouth, throat, and face: Denies mucositis or sore throat Respiratory: Denies cough, dyspnea or wheezes Cardiovascular: Denies palpitation, chest discomfort or lower extremity swelling Gastrointestinal:  Denies nausea, constipation, diarrhea GU: Denies dysuria or incontinence Skin: Denies abnormal skin rashes Neurological: Per HPI Musculoskeletal: Denies joint pain, back or neck discomfort. No decrease in ROM Behavioral/Psych: Denies anxiety, disturbance in thought content, and mood instability   Physical Exam: Vitals:   09/06/18 1002  BP: 117/75  Pulse: 87  Resp: 18  Temp: 97.8 F (36.6 C)  SpO2: 98%   KPS:  90. General: Alert, cooperative, pleasant, in no acute distress Head: Craniotomy scar noted, dry and intact. EENT: No conjunctival injection or scleral icterus. Oral mucosa moist Lungs: Resp effort normal Cardiac: Regular rate and rhythm Abdomen: Soft, non-distended abdomen Skin: No rashes cyanosis or petechiae. Extremities: No clubbing or edema  Neurologic Exam: Mental Status: Awake, alert, attentive to examiner. Oriented to self and environment. Language is fluent with intact comprehension.  Cranial Nerves: Visual acuity is grossly normal. Visual fields are full. Extra-ocular movements intact. No ptosis. Face is symmetric, tongue midline. Motor: Tone and bulk are normal. Power is full in both arms and legs. Reflexes are symmetric, no pathologic reflexes present. Intact finger to nose bilaterally Sensory: Intact to light touch and temperature Gait: Normal and tandem gait is normal.   Labs: I have reviewed the data as listed    Component Value  Date/Time   NA 140 08/28/2018 0932   K 3.7 08/28/2018 0932   CL 109 08/28/2018 0932   CO2 21 (L) 08/28/2018 0932   GLUCOSE 146 (H) 08/28/2018 0932   BUN 17 08/28/2018 0932   CREATININE 0.78 08/28/2018 0932   CALCIUM 9.0 08/28/2018 0932   PROT 7.0 08/28/2018 0932   ALBUMIN 4.0 08/28/2018 0932   AST 18 08/28/2018 0932   ALT 9 08/28/2018 0932   ALKPHOS 93 08/28/2018 0932   BILITOT 0.4 08/28/2018 0932   GFRNONAA >60 08/28/2018 0932   GFRAA >60 08/28/2018 0932   Lab Results  Component Value Date   WBC 3.6 (L) 08/28/2018   NEUTROABS 2.5 08/28/2018   HGB 10.6 (L) 08/28/2018   HCT 35.3 (L) 08/28/2018   MCV 75.4 (L) 08/28/2018   PLT 98 (L) 08/28/2018    Imaging:  Mr [name] AQ Contrast  Result Date: 08/10/2018 CLINICAL DATA:  Metastatic melanoma.  Post whole-brain radiation EXAM: MRI HEAD WITHOUT AND WITH CONTRAST TECHNIQUE: Multiplanar, multiecho pulse sequences of the brain and surrounding structures were obtained without and with  intravenous contrast. CONTRAST:  41mL MULTIHANCE GADOBENATE DIMEGLUMINE 529 MG/ML IV SOLN COMPARISON:  MRI head 05/11/2018 FINDINGS: Brain: Ventricle size normal. Mild changes in the white matter. No significant white matter edema or mass-effect. Negative for acute infarct. Numerous tiny metastatic deposits in the brain are widely distributed throughout both cerebral hemispheres. Nearly all of these show susceptibility due to melanin or hemorrhage. Many of the lesions over the convexity measure 1-2 mm. Largest lesion in the right occipital lobe is less than 1 cm. The lesions appear similar or improved from the prior study. No new or enlarging lesions. Vascular: Normal arterial flow voids Skull and upper cervical spine: No skull lesion. Numerous scalp and soft tissue lesions are markedly improved and also have susceptibility Sinuses/Orbits: Mucosal edema paranasal sinuses most prominent left maxillary sinus. No orbital mass lesion. Other: None IMPRESSION: Stable MRI. Numerous small metastatic deposits throughout the brain are stable to improved. Most of these show susceptibility consistent with melanin or hemorrhage. Numerous lesions throughout the scalp also have improved from the original study Electronically Signed   By: Franchot Gallo M.D.   On: 08/10/2018 14:33    [place] Clinician Interpretation: I have personally reviewed the radiological images as listed.  My interpretation, in the context of the patient's clinical presentation, is stable disease   Assessment/Plan 1. Brain metastasis ([place])  Ms. Blecha is clinically and radiographically stable today, now more than 1 year removed from whole brain radiation therapy.    Careful review of medical records demonstrates that Keppra was started only as seizure prophylaxis following initial MRI brain.  We advised her to discontinue Keppra at this time, although she should call us if she experiences any neurologic deficits or seizure-like activity.  We  spent twenty additional minutes teaching regarding the natural history, biology, and historical experience in the treatment of neurologic complications of cancer. We also provided teaching sheets for the patient to take home as an additional resource.  We appreciate the opportunity to participate in the care of [facility].   All questions were answered. The patient knows to call the clinic with any problems, questions or concerns. No barriers to learning were detected.  The total time spent in the encounter was 25 minutes and more than 50% was on counseling and review of test results   Ventura Sellers, MD Medical Director of Neuro-Oncology [facility] at [place]  [place] 09/06/18 4:35 PM

## 2018-09-07 ENCOUNTER — Telehealth: Payer: Self-pay | Admitting: Internal Medicine

## 2018-09-07 NOTE — Telephone Encounter (Signed)
No 3/4 los

## 2018-09-15 ENCOUNTER — Ambulatory Visit (HOSPITAL_COMMUNITY)
Admission: RE | Admit: 2018-09-15 | Discharge: 2018-09-15 | Disposition: A | Discharge status: Home / Self Care | Payer: Medicaid Other | Source: Ambulatory Visit | Attending: Oncology | Admitting: Oncology

## 2018-09-15 ENCOUNTER — Other Ambulatory Visit: Payer: Self-pay

## 2018-09-15 DIAGNOSIS — C439 Malignant melanoma of skin, unspecified: Secondary | ICD-10-CM | POA: Diagnosis not present

## 2018-09-15 MED ORDER — IOHEXOL 300 MG/ML  SOLN
100.0000 mL | Freq: Once | INTRAMUSCULAR | Status: AC | PRN
  Administered 2018-09-15: 100 mL via INTRAVENOUS

## 2018-09-15 MED ORDER — IOHEXOL 300 MG/ML  SOLN
30.0000 mL | Freq: Once | INTRAMUSCULAR | Status: AC | PRN
  Administered 2018-09-15: 30 mL via ORAL

## 2018-09-15 MED ORDER — SODIUM CHLORIDE (PF) 0.9 % IJ SOLN
INTRAMUSCULAR | Status: AC
  Filled 2018-09-15: qty 50

## 2018-09-18 ENCOUNTER — Other Ambulatory Visit: Payer: Self-pay

## 2018-09-18 ENCOUNTER — Inpatient Hospital Stay: Payer: Medicaid Other

## 2018-09-18 ENCOUNTER — Telehealth: Payer: Self-pay | Admitting: Oncology

## 2018-09-18 ENCOUNTER — Inpatient Hospital Stay (HOSPITAL_BASED_OUTPATIENT_CLINIC_OR_DEPARTMENT_OTHER): Payer: Medicaid Other | Admitting: Oncology

## 2018-09-18 VITALS — BP 132/84 | HR 87 | Temp 98.4°F | Resp 18 | Ht 61.0 in | Wt 144.0 lb

## 2018-09-18 DIAGNOSIS — C439 Malignant melanoma of skin, unspecified: Secondary | ICD-10-CM

## 2018-09-18 DIAGNOSIS — E119 Type 2 diabetes mellitus without complications: Secondary | ICD-10-CM

## 2018-09-18 DIAGNOSIS — C7931 Secondary malignant neoplasm of brain: Secondary | ICD-10-CM | POA: Diagnosis not present

## 2018-09-18 DIAGNOSIS — C786 Secondary malignant neoplasm of retroperitoneum and peritoneum: Secondary | ICD-10-CM

## 2018-09-18 DIAGNOSIS — Z5112 Encounter for antineoplastic immunotherapy: Secondary | ICD-10-CM | POA: Diagnosis not present

## 2018-09-18 DIAGNOSIS — Z79899 Other long term (current) drug therapy: Secondary | ICD-10-CM | POA: Diagnosis not present

## 2018-09-18 LAB — CMP (CANCER CENTER ONLY)
ALT: 8 U/L (ref 0–44)
AST: 17 U/L (ref 15–41)
Albumin: 4 g/dL (ref 3.5–5.0)
Alkaline Phosphatase: 88 U/L (ref 38–126)
Anion gap: 11 (ref 5–15)
BUN: 19 mg/dL (ref 6–20)
CO2: 20 mmol/L — ABNORMAL LOW (ref 22–32)
Calcium: 9.3 mg/dL (ref 8.9–10.3)
Chloride: 110 mmol/L (ref 98–111)
Creatinine: 0.76 mg/dL (ref 0.44–1.00)
GFR, Est AFR Am: >60 mL/min (ref >60)
GFR, Estimated: >60 mL/min (ref >60)
Glucose, Bld: 147 mg/dL — ABNORMAL HIGH (ref 70–99)
Potassium: 3.8 mmol/L (ref 3.5–5.1)
SODIUM: 141 mmol/L (ref 135–145)
Total Bilirubin: 0.5 mg/dL (ref 0.3–1.2)
Total Protein: 7.4 g/dL (ref 6.5–8.1)

## 2018-09-18 LAB — CBC WITH DIFFERENTIAL (CANCER CENTER ONLY)
Abs Immature Granulocytes: 0.01 10*3/uL (ref 0.00–0.07)
Basophils Absolute: 0 10*3/uL (ref 0.0–0.1)
Basophils Relative: 0 %
Eosinophils Absolute: 0 10*3/uL (ref 0.0–0.5)
Eosinophils Relative: 0 %
HCT: 38.8 % (ref 36.0–46.0)
HEMOGLOBIN: 11.3 g/dL — AB (ref 12.0–15.0)
Immature Granulocytes: 0 %
Lymphocytes Relative: 18 %
Lymphs Abs: 1 10*3/uL (ref 0.7–4.0)
MCH: 22.6 pg — ABNORMAL LOW (ref 26.0–34.0)
MCHC: 29.1 g/dL — AB (ref 30.0–36.0)
MCV: 77.8 fL — ABNORMAL LOW (ref 80.0–100.0)
Monocytes Absolute: 0.6 10*3/uL (ref 0.1–1.0)
Monocytes Relative: 11 %
Neutro Abs: 3.7 10*3/uL (ref 1.7–7.7)
Neutrophils Relative %: 71 %
Platelet Count: 115 10*3/uL — ABNORMAL LOW (ref 150–400)
RBC: 4.99 MIL/uL (ref 3.87–5.11)
RDW: 15.4 % (ref 11.5–15.5)
WBC: 5.2 10*3/uL (ref 4.0–10.5)
nRBC: 0 % (ref 0.0–0.2)

## 2018-09-18 MED ORDER — SODIUM CHLORIDE 0.9 % IV SOLN
Freq: Once | INTRAVENOUS | Status: AC
  Administered 2018-09-18: 10:00:00 via INTRAVENOUS
  Filled 2018-09-18: qty 250

## 2018-09-18 MED ORDER — SODIUM CHLORIDE 0.9 % IV SOLN
200.0000 mg | Freq: Once | INTRAVENOUS | Status: AC
  Administered 2018-09-18: 200 mg via INTRAVENOUS
  Filled 2018-09-18: qty 8

## 2018-09-18 MED ORDER — FERROUS SULFATE 325 (65 FE) MG PO TBEC: 325.0000 mg | DELAYED_RELEASE_TABLET | Freq: Two times a day (BID) | ORAL | 3 refills | Status: DC

## 2018-09-18 MED FILL — FERROUS SULFATE 325 MG TAB: 325 (65 FE) | 45 days supply | Qty: 90 | Fill #0

## 2018-09-18 NOTE — Patient Instructions (Signed)
[facility] Discharge Instructions for Patients Receiving Chemotherapy  Today you received the following chemotherapy agents:  Keytruda.  To help prevent nausea and vomiting after your treatment, we encourage you to take your nausea medication as directed.   If you develop nausea and vomiting that is not controlled by your nausea medication, call the clinic.   BELOW ARE SYMPTOMS THAT SHOULD BE REPORTED IMMEDIATELY:  *FEVER GREATER THAN 100.5 F  *CHILLS WITH OR WITHOUT FEVER  NAUSEA AND VOMITING THAT IS NOT CONTROLLED WITH YOUR NAUSEA MEDICATION  *UNUSUAL SHORTNESS OF BREATH  *UNUSUAL BRUISING OR BLEEDING  TENDERNESS IN MOUTH AND THROAT WITH OR WITHOUT PRESENCE OF ULCERS  *URINARY PROBLEMS  *BOWEL PROBLEMS  UNUSUAL RASH Items with * indicate a potential emergency and should be followed up as soon as possible.  Feel free to call the clinic should you have any questions or concerns. The clinic phone number is [phone].  Please show the CHEMO ALERT CARD at check-in to the Emergency Department and triage nurse.    

## 2018-09-18 NOTE — Telephone Encounter (Signed)
Scheduled appt per 3/16 los. ° °Printed calendar and avs. °

## 2018-09-18 NOTE — Progress Notes (Signed)
Hematology and Oncology Follow Up Visit  Kaitlyn Duncan [id number] 06-05-1971 48 y.o. 09/18/2018 8:32 AM Patient, No Pcp Kaitlyn Saas, MD   Principle Diagnosis: 48 year old woman with advanced melanoma diagnosed in July 2019.  She presented with stage IV disease that is BRAF positive with involvement into the peritoneum and CNS disease.  Prior Therapy: He is status post subcutaneous nodule biopsy on 01/27/2018 which confirmed the presence of metastatic melanoma. She status post endoscopy on 01/28/2018 which showed subcutaneous involvement that consistent with metastatic melanoma.  Whole brain radiation for total of 30 Gy in 10 fractions started on 02/07/2018.   Mektovi 45 mg bid, [name] 450 mg daily started in August 2019.  Therapy discontinued in November 2019 after partial response.  Current therapy: Pembrolizumab 200 mg every 3 weeks started on 06/05/2018.  She is here for cycle 5 of therapy.    Interim History: Kaitlyn Duncan is here for a follow-up visit.  Since the last visit, she reports no major changes in her health.  She continues to tolerate Pembrolizumab without any recent complaints.  She denies any excessive fatigue, tiredness or skin rash.  She does report dry skin but otherwise no other complaints.  She denies any masses or lesions.  She denies any decline in her appetite or quality of life.   She denied any alteration mental status, neuropathy, confusion or dizziness.  Denies any headaches or lethargy.  Denies any night sweats, weight loss or changes in appetite.  Denied orthopnea, dyspnea on exertion or chest discomfort.  Denies shortness of breath, difficulty breathing hemoptysis or cough.  Denies any abdominal distention, nausea, early satiety or dyspepsia.  Denies any hematuria, frequency, dysuria or nocturia.  Denies any skin irritation, dryness or rash.  Denies any ecchymosis or petechiae.  Denies any lymphadenopathy or clotting.  Denies any heat or cold  intolerance.  Denies any anxiety or depression.  Remaining review of system is negative.        Medications: I have reviewed the patient's current medications.  Current Outpatient Medications  Medication Sig Dispense Refill  . acetaminophen (TYLENOL) 325 MG tablet Take 650 mg by mouth every 6 (six) hours as needed.    . Alpha-D-Galactosidase (BEANO PO) Take 2 tablets by mouth 3 (three) times daily. Patient takes 2 tablets three times a day with meals to help with gas and indigestion.    . cetirizine (ZYRTEC) 10 MG tablet Take 10 mg by mouth as needed for allergies (for seasonal allergies).    . fluticasone (FLONASE) 50 MCG/ACT nasal spray Place 1 spray into both nostrils daily. 9.9 g 2  . levETIRAcetam (KEPPRA) 1000 MG tablet Take 1,000 mg by mouth 2 (two) times daily.    . Multiple Vitamin (MULTIVITAMIN WITH MINERALS) TABS tablet Take 1 tablet by mouth daily.    . mupirocin ointment (BACTROBAN) 2 % Place 1 application into the nose 2 (two) times daily. (Patient not taking: Reported on 09/06/2018) 30 g 1  . ondansetron (ZOFRAN) 8 MG tablet Take 1 tablet (8 mg total) by mouth every 8 (eight) hours as needed for nausea or vomiting. (Patient not taking: Reported on 03/30/2018) 20 tablet 0   No current facility-administered medications for this visit.      Allergies: No Known Allergies  Past Medical History, Surgical history, Social history, and Family History were reviewed and updated.  Review of Systems:    Physical Exam:    Blood pressure 132/84, pulse 87, temperature 98.4 F (36.9 C), temperature source Oral,  resp. rate 18, height 5' 1" (1.549 m), weight 144 lb (65.3 kg), SpO2 100 %.      ECOG: 1   General appearance: Alert, awake without any distress. Head: Atraumatic without abnormalities Oropharynx: Without any thrush or ulcers. Eyes: No scleral icterus. Lymph nodes: No lymphadenopathy noted in the cervical, supraclavicular, or axillary nodes Heart:regular rate and  rhythm, without any murmurs or gallops.   Lung: Clear to auscultation without any rhonchi, wheezes or dullness to percussion. Abdomin: Soft, nontender without any shifting dullness or ascites. Musculoskeletal: No clubbing or cyanosis. Neurological: No motor or sensory deficits. Skin: No rashes or lesions. Psychiatric: Mood and affect appeared normal.           Lab Results: Lab Results  Component Value Date   WBC 3.6 (L) 08/28/2018   HGB 10.6 (L) 08/28/2018   HCT 35.3 (L) 08/28/2018   MCV 75.4 (L) 08/28/2018   PLT 98 (L) 08/28/2018     Chemistry      Component Value Date/Time   NA 140 08/28/2018 0932   K 3.7 08/28/2018 0932   CL 109 08/28/2018 0932   CO2 21 (L) 08/28/2018 0932   BUN 17 08/28/2018 0932   CREATININE 0.78 08/28/2018 0932      Component Value Date/Time   CALCIUM 9.0 08/28/2018 0932   ALKPHOS 93 08/28/2018 0932   AST 18 08/28/2018 0932   ALT 9 08/28/2018 0932   BILITOT 0.4 08/28/2018 0932     EXAM: CT CHEST, ABDOMEN, AND PELVIS WITH CONTRAST  TECHNIQUE: Multidetector CT imaging of the chest, abdomen and pelvis was performed following the standard protocol during bolus administration of intravenous contrast.  CONTRAST:  156m OMNIPAQUE IOHEXOL 300 MG/ML SOLN, 345mOMNIPAQUE IOHEXOL 300 MG/ML SOLN  COMPARISON:  CT chest abdomen pelvis, 04/28/2018, 01/26/2018  FINDINGS: CT CHEST FINDINGS  Cardiovascular: No significant vascular findings. Normal heart size. No pericardial effusion.  Mediastinum/Nodes: Stable small axillary lymph nodes. Thyroid gland, trachea, and esophagus demonstrate no significant findings.  Lungs/Pleura: There are numerous very tiny pulmonary nodules, best appreciated by comparison to prior examination dated 01/26/2018, for example a 4 mm nodule of the right middle lobe (series 6, image 65). These are decreased in size compared to examination dated 01/26/2018 and stable compared to immediate prior examination. No  pleural effusion or pneumothorax.  Musculoskeletal: Stable small subcutaneous lesion of the right chest wall (series 2, image 42). There are innumerable very tiny small subcutaneous lesions unchanged from immediate prior examination although substantially decreased in size compared to prior dated 01/26/2018.  CT ABDOMEN PELVIS FINDINGS  Hepatobiliary: No focal liver abnormality is seen. Irregular intraluminal soft tissue attenuation within the gallbladder has substantially increased in size compared to prior examination, and is presumably metastatic disease (series 4, image 32, series 2, image 51)  Pancreas: Unremarkable. No pancreatic ductal dilatation or surrounding inflammatory changes.  Spleen: Normal in size without focal abnormality.  Adrenals/Urinary Tract: Slight interval decrease in size of bilateral adrenal nodules, the larger nodule on the left measuring 2.7 x 1.9 cm, previously 3.0 x 2.3 cm. Kidneys are normal, without renal calculi, focal lesion, or hydronephrosis. Bladder is unremarkable.  Stomach/Bowel: Stomach is within normal limits. No evidence of bowel wall thickening, distention, or inflammatory changes.  Vascular/Lymphatic: No significant vascular findings are present. No enlarged abdominal or pelvic lymph nodes.  Reproductive: Stable appearance of multiple uterine masses, some of which are partially calcified, likely uterine fibroids.  Other: Stable very tiny peritoneal and retroperitoneal soft tissue nodules compared to  prior examination, again substantially decreased in size compared to prior dated 01/26/2018. No abdominal wall hernia or abnormality. No abdominopelvic ascites.  Musculoskeletal: Unchanged appearance of the spine without definite evidence of metastatic disease. Trabeculated benign hemangioma of the T12 vertebral body.  IMPRESSION: 1. Generally stable appearance of advanced metastatic disease including pulmonary  nodules, subcutaneous, peritoneal, and retroperitoneal soft tissue nodules when compared to prior examination dated 04/28/2018. These findings are substantially decreased in size and conspicuity compared to initial examination dated 01/26/2018.  2. Slight interval decrease in size of bilateral adrenal nodules compared to immediate prior examination, significantly decreased in size compared to initial examination.  3. Marked interval increase in size of irregular intraluminal soft tissue attenuation within the gallbladder, most consistent with metastatic disease. Although very unusual in general, gallbladder metastatic disease has been reported in melanoma. There may or may not be additional gallbladder sludge or gravel present.     Impression and Plan:   48 year old woman with:  1.  Stage IV melanoma with unknown primary with documented brain, lymphadenopathy and peritoneal disease.      She continues to be on Pembrolizumab with overall clinical benefit as well as radiographic improvement of her disease.  CT scan obtained on September 15, 2018 was personally reviewed today and continue to show further reduction of her metastasis.  Risks and benefits of continuing this therapy long-term was discussed today and she is agreeable to continue.  Alternative options would be treatment discontinuation versus adding ipilimumab or BRAF targeted therapy.  After discussion today, we elected to continue with single agent Pembrolizumab given her overall tolerance and excellent response.  2.  CNS metastasis: No clinical signs or symptoms of disease progression.  MRI of the brain on August 10, 2018 showed stable disease.  She is off seizure medication.  3.  Prognosis: Her performance status remain excellent and aggressive therapy is warranted despite her incurable disease.  4.  Psychosocial consideration: She continues to have adequate resources at this time we will continue to help her through this  matter.  5.  Immune mediated complications: Long-term complications associated with immunotherapy were reiterated today.  These would include pneumonitis, colitis and thyroid disease.  6.  Anemia: Iron studies in February 2020 showed sign of iron deficiency.  Oral iron versus intravenous iron options were reviewed today.  She is agreeable to take oral iron therapy which will be prescribed to her to take twice a day.  7.  Follow-up: Will be in 3 weeks for evaluation prior to her next treatment.  25  minutes was spent with the patient face-to-face today.  More than 50% of time was dedicated discussing her imaging studies, treatment options and answering questions regarding future plan of care.   Zola Button, MD 3/16/20208:32 AM

## 2018-10-02 ENCOUNTER — Other Ambulatory Visit: Payer: Self-pay | Admitting: Radiation Therapy

## 2018-10-02 DIAGNOSIS — C7949 Secondary malignant neoplasm of other parts of nervous system: Principal | ICD-10-CM

## 2018-10-02 DIAGNOSIS — C7931 Secondary malignant neoplasm of brain: Secondary | ICD-10-CM

## 2018-10-05 MED FILL — SOLIFENACIN SUCCINATE 5 MG: 5 | 30 days supply | Qty: 30 | Fill #0

## 2018-10-09 ENCOUNTER — Other Ambulatory Visit: Payer: Self-pay

## 2018-10-09 ENCOUNTER — Inpatient Hospital Stay: Payer: Medicaid Other

## 2018-10-09 ENCOUNTER — Inpatient Hospital Stay (HOSPITAL_BASED_OUTPATIENT_CLINIC_OR_DEPARTMENT_OTHER): Payer: Medicaid Other | Admitting: Oncology

## 2018-10-09 ENCOUNTER — Telehealth: Payer: Self-pay | Admitting: Oncology

## 2018-10-09 ENCOUNTER — Inpatient Hospital Stay: Payer: Medicaid Other | Attending: Obstetrics and Gynecology

## 2018-10-09 VITALS — BP 121/82 | HR 74 | Temp 98.1°F | Resp 19 | Ht 61.0 in | Wt 141.9 lb

## 2018-10-09 DIAGNOSIS — C786 Secondary malignant neoplasm of retroperitoneum and peritoneum: Secondary | ICD-10-CM | POA: Insufficient documentation

## 2018-10-09 DIAGNOSIS — Z79899 Other long term (current) drug therapy: Secondary | ICD-10-CM | POA: Diagnosis not present

## 2018-10-09 DIAGNOSIS — D696 Thrombocytopenia, unspecified: Secondary | ICD-10-CM | POA: Insufficient documentation

## 2018-10-09 DIAGNOSIS — Z5112 Encounter for antineoplastic immunotherapy: Secondary | ICD-10-CM | POA: Diagnosis not present

## 2018-10-09 DIAGNOSIS — C7949 Secondary malignant neoplasm of other parts of nervous system: Secondary | ICD-10-CM | POA: Diagnosis not present

## 2018-10-09 DIAGNOSIS — D649 Anemia, unspecified: Secondary | ICD-10-CM

## 2018-10-09 DIAGNOSIS — C439 Malignant melanoma of skin, unspecified: Secondary | ICD-10-CM | POA: Insufficient documentation

## 2018-10-09 LAB — CBC WITH DIFFERENTIAL (CANCER CENTER ONLY)
Abs Immature Granulocytes: 0.01 10*3/uL (ref 0.00–0.07)
Basophils Absolute: 0 10*3/uL (ref 0.0–0.1)
Basophils Relative: 0 %
Eosinophils Absolute: 0 10*3/uL (ref 0.0–0.5)
Eosinophils Relative: 0 %
HCT: 38.9 % (ref 36.0–46.0)
Hemoglobin: 11.6 g/dL — ABNORMAL LOW (ref 12.0–15.0)
Immature Granulocytes: 0 %
Lymphocytes Relative: 16 %
Lymphs Abs: 0.7 10*3/uL (ref 0.7–4.0)
MCH: 23.9 pg — ABNORMAL LOW (ref 26.0–34.0)
MCHC: 29.8 g/dL — ABNORMAL LOW (ref 30.0–36.0)
MCV: 80.2 fL (ref 80.0–100.0)
Monocytes Absolute: 0.5 10*3/uL (ref 0.1–1.0)
Monocytes Relative: 10 %
Neutro Abs: 3.3 10*3/uL (ref 1.7–7.7)
Neutrophils Relative %: 74 %
Platelet Count: 82 10*3/uL — ABNORMAL LOW (ref 150–400)
RBC: 4.85 MIL/uL (ref 3.87–5.11)
RDW: 18.3 % — ABNORMAL HIGH (ref 11.5–15.5)
WBC Count: 4.5 10*3/uL (ref 4.0–10.5)
nRBC: 0 % (ref 0.0–0.2)

## 2018-10-09 LAB — CMP (CANCER CENTER ONLY)
ALT: 12 U/L (ref 0–44)
AST: 19 U/L (ref 15–41)
Albumin: 4 g/dL (ref 3.5–5.0)
Alkaline Phosphatase: 77 U/L (ref 38–126)
Anion gap: 11 (ref 5–15)
BUN: 22 mg/dL — ABNORMAL HIGH (ref 6–20)
CO2: 21 mmol/L — ABNORMAL LOW (ref 22–32)
Calcium: 9.3 mg/dL (ref 8.9–10.3)
Chloride: 108 mmol/L (ref 98–111)
Creatinine: 0.82 mg/dL (ref 0.44–1.00)
GFR, Est AFR Am: >60 mL/min (ref >60)
GFR, Estimated: >60 mL/min (ref >60)
Glucose, Bld: 138 mg/dL — ABNORMAL HIGH (ref 70–99)
Potassium: 3.8 mmol/L (ref 3.5–5.1)
Sodium: 140 mmol/L (ref 135–145)
Total Bilirubin: 0.3 mg/dL (ref 0.3–1.2)
Total Protein: 7.2 g/dL (ref 6.5–8.1)

## 2018-10-09 MED ORDER — SODIUM CHLORIDE 0.9 % IV SOLN
200.0000 mg | Freq: Once | INTRAVENOUS | Status: AC
  Administered 2018-10-09: 200 mg via INTRAVENOUS
  Filled 2018-10-09: qty 8

## 2018-10-09 MED ORDER — SODIUM CHLORIDE 0.9 % IV SOLN
Freq: Once | INTRAVENOUS | Status: AC
  Administered 2018-10-09: 11:00:00 via INTRAVENOUS
  Filled 2018-10-09: qty 250

## 2018-10-09 NOTE — Progress Notes (Signed)
Per Dr. Alen Blew: OK to treat with platelets of 82,000

## 2018-10-09 NOTE — Progress Notes (Signed)
Hematology and Oncology Follow Up Visit  Kaitlyn Duncan [id number] 1971/03/13 48 y.o. 10/09/2018 9:46 AM Egbert Garibaldi, Mathis Dad, MD   Principle Diagnosis: 24 year old woman with stage IVmelanoma with unknown primary with involvement of the peritoneum and CNS disease diagnosed in July 2019.  She was found to have BRAF positive tumor.  Prior Therapy: He is status post subcutaneous nodule biopsy on 01/27/2018 which confirmed the presence of metastatic melanoma. She status post endoscopy on 01/28/2018 which showed subcutaneous involvement that consistent with metastatic melanoma.  Whole brain radiation for total of 30 Gy in 10 fractions started on 02/07/2018.   Mektovi 45 mg bid, [name] 450 mg daily started in August 2019.  Therapy discontinued in November 2019 after partial response.  Current therapy: Pembrolizumab 200 mg every 3 weeks started on 06/05/2018.  She is here for cycle 6 of therapy.    Interim History: Kaitlyn Duncan returns today for a repeat evaluation.  Since the last visit, she reports no major changes in her health.  She continues to tolerate Pembrolizumab without any recent complaints.  She denies any recent hospitalizations or illnesses.  She does report some skin dryness but no rash.  She denies any pruritus or irritation.  She denies any changes in bowel habits or respiratory complaints.  Her performance status and activity level remains unchanged.   Patient denied headaches, blurry vision, syncope or seizures.  Denies any fevers, chills or sweats.  Denied chest pain, palpitation, orthopnea or leg edema.  Denied cough, wheezing or hemoptysis.  Denied nausea, vomiting or abdominal pain.  Denies any constipation or diarrhea.  Denies any frequency urgency or hesitancy.  Denies any arthralgias or myalgias.  Denies any skin rashes or lesions.  Denies any bleeding or clotting tendency.  Denies any easy bruising.  Denies any hair or nail changes.  Denies any anxiety or  depression.  Remaining review of system is negative.           Medications: I have reviewed the patient's current medications.  Current Outpatient Medications  Medication Sig Dispense Refill  . acetaminophen (TYLENOL) 325 MG tablet Take 650 mg by mouth every 6 (six) hours as needed.    . Alpha-D-Galactosidase (BEANO PO) Take 2 tablets by mouth 3 (three) times daily. Patient takes 2 tablets three times a day with meals to help with gas and indigestion.    . cetirizine (ZYRTEC) 10 MG tablet Take 10 mg by mouth as needed for allergies (for seasonal allergies).    . ferrous sulfate 325 (65 FE) MG EC tablet Take 1 tablet (325 mg total) by mouth 2 (two) times daily. 90 tablet 3  . fluticasone (FLONASE) 50 MCG/ACT nasal spray Place 1 spray into both nostrils daily. 9.9 g 2  . levETIRAcetam (KEPPRA) 1000 MG tablet Take 1,000 mg by mouth 2 (two) times daily.    . Multiple Vitamin (MULTIVITAMIN WITH MINERALS) TABS tablet Take 1 tablet by mouth daily.    . mupirocin ointment (BACTROBAN) 2 % Place 1 application into the nose 2 (two) times daily. (Patient not taking: Reported on 09/06/2018) 30 g 1  . ondansetron (ZOFRAN) 8 MG tablet Take 1 tablet (8 mg total) by mouth every 8 (eight) hours as needed for nausea or vomiting. (Patient not taking: Reported on 03/30/2018) 20 tablet 0   No current facility-administered medications for this visit.      Allergies: No Known Allergies  Past Medical History, Surgical history, Social history, and Family History were reviewed and updated.  Review of Systems:    Physical Exam:  Blood pressure 121/82, pulse 74, temperature 98.1 F (36.7 C), temperature source Oral, resp. rate 19, height '5\' 1"'$  (1.549 m), weight 141 lb 14.4 oz (64.4 kg), SpO2 99 %.     ECOG: 1   General appearance: Comfortable appearing without any discomfort Head: Normocephalic without any trauma Oropharynx: Mucous membranes are moist and pink without any thrush or ulcers. Eyes:  Pupils are equal and round reactive to light. Lymph nodes: No cervical, supraclavicular, inguinal or axillary lymphadenopathy.   Heart:regular rate and rhythm.  S1 and S2 without leg edema. Lung: Clear without any rhonchi or wheezes.  No dullness to percussion. Abdomin: Soft, nontender, nondistended with good bowel sounds.  No hepatosplenomegaly. Musculoskeletal: No joint deformity or effusion.  Full range of motion noted. Neurological: No deficits noted on motor, sensory and deep tendon reflex exam. Skin: No petechial rash or dryness.  Appeared moist.             Lab Results: Lab Results  Component Value Date   WBC 4.5 10/09/2018   HGB 11.6 (L) 10/09/2018   HCT 38.9 10/09/2018   MCV 80.2 10/09/2018   PLT 82 (L) 10/09/2018     Chemistry      Component Value Date/Time   NA 141 09/18/2018 0826   K 3.8 09/18/2018 0826   CL 110 09/18/2018 0826   CO2 20 (L) 09/18/2018 0826   BUN 19 09/18/2018 0826   CREATININE 0.76 09/18/2018 0826      Component Value Date/Time   CALCIUM 9.3 09/18/2018 0826   ALKPHOS 88 09/18/2018 0826   AST 17 09/18/2018 0826   ALT 8 09/18/2018 0826   BILITOT 0.5 09/18/2018 0826          Impression and Plan:   48 year old woman with:  1.  Melanoma documented in July 2019 with stage IV disease with involvement in the peritoneum and lymphadenopathy.  She continues to tolerate Pembrolizumab without any major complaints.  Risks and benefits of continuing this therapy long-term was discussed today she is agreeable to continue.  We will repeat imaging studies in June of this year.  Alternative therapy as well as different salvage therapies were also reviewed.   2.  CNS metastasis: No signs or symptoms of disease spread at this time.  Last imaging studies in February did not show any evidence of metastatic disease.  She is off Keppra at this time.  3.  Prognosis: Therapy remains palliative but aggressive therapy is warranted at this time.  4.   Psychosocial consideration: No issues at this time reported.  Is adequate resources to continue treatment at this time.  5.  Immune mediated complications: She is experiencing very mild irritation in her skin.  Other complications include dermatitis and skin rash are also discussed.  Colitis, pneumonitis and thyroid disease are also reiterated.  6.  Anemia: Hemoglobin is improving at this time with iron supplementation.  I recommended continuing this at this time.  7.  Thrombocytopenia: Appears to be mild and asymptomatic.  To be related to autoimmune etiology.  We will continue to monitor closely.  8.  Follow-up: In 3 weeks for next Pembrolizumab treatment.  25  minutes was spent with the patient face-to-face today.  More than 50% of time was spent on reviewing laboratory data, disease status update and answering questions regarding future plan of care.   Zola Button, MD 4/6/20209:46 AM

## 2018-10-09 NOTE — Patient Instructions (Signed)
[facility] Discharge Instructions for Patients Receiving Chemotherapy  Today you received the following chemotherapy agents:  Keytruda.  To help prevent nausea and vomiting after your treatment, we encourage you to take your nausea medication as directed.   If you develop nausea and vomiting that is not controlled by your nausea medication, call the clinic.   BELOW ARE SYMPTOMS THAT SHOULD BE REPORTED IMMEDIATELY:  *FEVER GREATER THAN 100.5 F  *CHILLS WITH OR WITHOUT FEVER  NAUSEA AND VOMITING THAT IS NOT CONTROLLED WITH YOUR NAUSEA MEDICATION  *UNUSUAL SHORTNESS OF BREATH  *UNUSUAL BRUISING OR BLEEDING  TENDERNESS IN MOUTH AND THROAT WITH OR WITHOUT PRESENCE OF ULCERS  *URINARY PROBLEMS  *BOWEL PROBLEMS  UNUSUAL RASH Items with * indicate a potential emergency and should be followed up as soon as possible.  Feel free to call the clinic should you have any questions or concerns. The clinic phone number is [phone].  Please show the CHEMO ALERT CARD at check-in to the Emergency Department and triage nurse.    

## 2018-10-09 NOTE — Telephone Encounter (Signed)
Scheduled appt per 4/6 sch message. °

## 2018-10-19 ENCOUNTER — Telehealth: Payer: Self-pay

## 2018-10-19 NOTE — Telephone Encounter (Signed)
Received call from patient stating that she is having dark stools, mild constipation and is concerned that it is related to [facility]. Explained that also started on iron and this can be a SE of iron as well. Patient not concerned about blood in stool, no signs. Advised to take Sennokot-S 1 to 2 tabs at night and if needed can take Miralax and need to increase fluid intake. Patient verbalized understanding and aware to call back with other questions or concerns.

## 2018-10-30 ENCOUNTER — Inpatient Hospital Stay (HOSPITAL_BASED_OUTPATIENT_CLINIC_OR_DEPARTMENT_OTHER): Payer: Medicaid Other | Admitting: Oncology

## 2018-10-30 ENCOUNTER — Inpatient Hospital Stay: Payer: Medicaid Other

## 2018-10-30 ENCOUNTER — Other Ambulatory Visit: Payer: Self-pay

## 2018-10-30 VITALS — BP 119/84 | HR 85 | Temp 97.4°F | Resp 18 | Ht 61.0 in | Wt 142.0 lb

## 2018-10-30 DIAGNOSIS — C786 Secondary malignant neoplasm of retroperitoneum and peritoneum: Secondary | ICD-10-CM | POA: Diagnosis not present

## 2018-10-30 DIAGNOSIS — C7931 Secondary malignant neoplasm of brain: Secondary | ICD-10-CM | POA: Diagnosis not present

## 2018-10-30 DIAGNOSIS — C439 Malignant melanoma of skin, unspecified: Secondary | ICD-10-CM

## 2018-10-30 DIAGNOSIS — D696 Thrombocytopenia, unspecified: Secondary | ICD-10-CM

## 2018-10-30 DIAGNOSIS — C7949 Secondary malignant neoplasm of other parts of nervous system: Secondary | ICD-10-CM | POA: Diagnosis not present

## 2018-10-30 DIAGNOSIS — Z79899 Other long term (current) drug therapy: Secondary | ICD-10-CM

## 2018-10-30 DIAGNOSIS — Z5112 Encounter for antineoplastic immunotherapy: Secondary | ICD-10-CM | POA: Diagnosis not present

## 2018-10-30 DIAGNOSIS — D649 Anemia, unspecified: Secondary | ICD-10-CM

## 2018-10-30 LAB — CMP (CANCER CENTER ONLY)
ALT: 11 U/L (ref 0–44)
AST: 21 U/L (ref 15–41)
Albumin: 3.9 g/dL (ref 3.5–5.0)
Alkaline Phosphatase: 77 U/L (ref 38–126)
Anion gap: 10 (ref 5–15)
BUN: 20 mg/dL (ref 6–20)
CO2: 20 mmol/L — ABNORMAL LOW (ref 22–32)
Calcium: 8.8 mg/dL — ABNORMAL LOW (ref 8.9–10.3)
Chloride: 110 mmol/L (ref 98–111)
Creatinine: 0.77 mg/dL (ref 0.44–1.00)
GFR, Est AFR Am: >60 mL/min (ref >60)
GFR, Estimated: >60 mL/min (ref >60)
Glucose, Bld: 119 mg/dL — ABNORMAL HIGH (ref 70–99)
Potassium: 3.7 mmol/L (ref 3.5–5.1)
Sodium: 140 mmol/L (ref 135–145)
Total Bilirubin: 0.2 mg/dL — ABNORMAL LOW (ref 0.3–1.2)
Total Protein: 6.9 g/dL (ref 6.5–8.1)

## 2018-10-30 LAB — CBC WITH DIFFERENTIAL (CANCER CENTER ONLY)
Abs Immature Granulocytes: 0.01 10*3/uL (ref 0.00–0.07)
Basophils Absolute: 0 10*3/uL (ref 0.0–0.1)
Basophils Relative: 0 %
Eosinophils Absolute: 0 10*3/uL (ref 0.0–0.5)
Eosinophils Relative: 0 %
HCT: 38.3 % (ref 36.0–46.0)
Hemoglobin: 11.7 g/dL — ABNORMAL LOW (ref 12.0–15.0)
Immature Granulocytes: 0 %
Lymphocytes Relative: 18 %
Lymphs Abs: 0.8 10*3/uL (ref 0.7–4.0)
MCH: 25 pg — ABNORMAL LOW (ref 26.0–34.0)
MCHC: 30.5 g/dL (ref 30.0–36.0)
MCV: 81.8 fL (ref 80.0–100.0)
Monocytes Absolute: 0.5 10*3/uL (ref 0.1–1.0)
Monocytes Relative: 12 %
Neutro Abs: 3.1 10*3/uL (ref 1.7–7.7)
Neutrophils Relative %: 70 %
Platelet Count: 95 10*3/uL — ABNORMAL LOW (ref 150–400)
RBC: 4.68 MIL/uL (ref 3.87–5.11)
RDW: 18 % — ABNORMAL HIGH (ref 11.5–15.5)
WBC Count: 4.4 10*3/uL (ref 4.0–10.5)
nRBC: 0 % (ref 0.0–0.2)

## 2018-10-30 LAB — TSH: TSH: 2.395 u[IU]/mL (ref 0.308–3.960)

## 2018-10-30 MED ORDER — SODIUM CHLORIDE 0.9 % IV SOLN
200.0000 mg | Freq: Once | INTRAVENOUS | Status: AC
  Administered 2018-10-30: 200 mg via INTRAVENOUS
  Filled 2018-10-30: qty 8

## 2018-10-30 MED ORDER — SODIUM CHLORIDE 0.9 % IV SOLN
Freq: Once | INTRAVENOUS | Status: AC
  Administered 2018-10-30: 11:00:00 via INTRAVENOUS
  Filled 2018-10-30: qty 250

## 2018-10-30 NOTE — Patient Instructions (Signed)
[facility] Discharge Instructions for Patients Receiving Chemotherapy  Today you received the following chemotherapy agents:  Keytruda.  To help prevent nausea and vomiting after your treatment, we encourage you to take your nausea medication as directed.   If you develop nausea and vomiting that is not controlled by your nausea medication, call the clinic.   BELOW ARE SYMPTOMS THAT SHOULD BE REPORTED IMMEDIATELY:  *FEVER GREATER THAN 100.5 F  *CHILLS WITH OR WITHOUT FEVER  NAUSEA AND VOMITING THAT IS NOT CONTROLLED WITH YOUR NAUSEA MEDICATION  *UNUSUAL SHORTNESS OF BREATH  *UNUSUAL BRUISING OR BLEEDING  TENDERNESS IN MOUTH AND THROAT WITH OR WITHOUT PRESENCE OF ULCERS  *URINARY PROBLEMS  *BOWEL PROBLEMS  UNUSUAL RASH Items with * indicate a potential emergency and should be followed up as soon as possible.  Feel free to call the clinic should you have any questions or concerns. The clinic phone number is [phone].  Please show the CHEMO ALERT CARD at check-in to the Emergency Department and triage nurse.    

## 2018-10-30 NOTE — Progress Notes (Signed)
Hematology and Oncology Follow Up Visit  Kaitlyn Duncan [id number] April 12, 1971 48 y.o. 10/30/2018 9:25 AM Kaitlyn Duncan, Kaitlyn Dad, MD   Principle Diagnosis: 59 year old woman with melanoma of unknown primary with documented metastatic disease to the peritoneum, lymphadenopathy and CNS diagnosed in July 2019.  She was found to have BRAF positive tumor.  Prior Therapy: He is status post subcutaneous nodule biopsy on 01/27/2018 which confirmed the presence of metastatic melanoma. She status post endoscopy on 01/28/2018 which showed subcutaneous involvement that consistent with metastatic melanoma.  Whole brain radiation for total of 30 Gy in 10 fractions started on 02/07/2018.   Mektovi 45 mg bid, [name] 450 mg daily started in August 2019.  Therapy discontinued in November 2019 after partial response.  Current therapy: Pembrolizumab 200 mg every 3 weeks started on 06/05/2018.  She is here for cycle 7 of therapy.    Interim History: Kaitlyn Duncan is here for a follow-up visit.  Since last visit, she reports no major changes in her health.  She continues to tolerate Pembrolizumab without any major complaints.  She does report occasional skin itching and dryness but no rash or lesions.  Continues to be active and attends to activities of daily living.  She did report some constipation and dyspepsia associated with iron therapy.   She denied any alteration mental status, neuropathy, confusion or dizziness.  Denies any headaches or lethargy.  Denies any night sweats, weight loss or changes in appetite.  Denied orthopnea, dyspnea on exertion or chest discomfort.  Denies shortness of breath, difficulty breathing hemoptysis or cough.  Denies any abdominal distention, nausea, early satiety or dyspepsia.  Denies any hematuria, frequency, dysuria or nocturia.  Denies any skin irritation, dryness or rash.  Denies any ecchymosis or petechiae.  Denies any lymphadenopathy or clotting.  Denies any heat  or cold intolerance.  Denies any anxiety or depression.  Remaining review of system is negative.            Medications: I have reviewed the patient's current medications.  Current Outpatient Medications  Medication Sig Dispense Refill  . acetaminophen (TYLENOL) 325 MG tablet Take 650 mg by mouth every 6 (six) hours as needed.    . cetirizine (ZYRTEC) 10 MG tablet Take 10 mg by mouth as needed for allergies (for seasonal allergies).    . ferrous sulfate 325 (65 FE) MG EC tablet Take 1 tablet (325 mg total) by mouth 2 (two) times daily. 90 tablet 3  . fluticasone (FLONASE) 50 MCG/ACT nasal spray Place 1 spray into both nostrils daily. 9.9 g 2  . Multiple Vitamin (MULTIVITAMIN WITH MINERALS) TABS tablet Take 1 tablet by mouth daily.    . mupirocin ointment (BACTROBAN) 2 % Place 1 application into the nose 2 (two) times daily. (Patient not taking: Reported on 09/06/2018) 30 g 1   No current facility-administered medications for this visit.      Allergies: No Known Allergies  Past Medical History, Surgical history, Social history, and Family History were reviewed and updated.  Review of Systems:    Physical Exam:  Blood pressure 119/84, pulse 85, temperature (!) 97.4 F (36.3 C), temperature source Oral, resp. rate 18, height 5' 1" (1.549 m), weight 142 lb (64.4 kg), SpO2 99 %.      ECOG: 1     General appearance: Alert, awake without any distress. Head: Atraumatic without abnormalities Oropharynx: Without any thrush or ulcers. Eyes: No scleral icterus. Lymph nodes: No lymphadenopathy noted in the cervical, supraclavicular, or  axillary nodes Heart:regular rate and rhythm, without any murmurs or gallops.   Lung: Clear to auscultation without any rhonchi, wheezes or dullness to percussion. Abdomin: Soft, nontender without any shifting dullness or ascites. Musculoskeletal: No clubbing or cyanosis. Neurological: No motor or sensory deficits. Skin: No rashes or  lesions.            Lab Results: Lab Results  Component Value Date   WBC 4.5 10/09/2018   HGB 11.6 (L) 10/09/2018   HCT 38.9 10/09/2018   MCV 80.2 10/09/2018   PLT 82 (L) 10/09/2018     Chemistry      Component Value Date/Time   NA 140 10/09/2018 0928   K 3.8 10/09/2018 0928   CL 108 10/09/2018 0928   CO2 21 (L) 10/09/2018 0928   BUN 22 (H) 10/09/2018 0928   CREATININE 0.82 10/09/2018 0928      Component Value Date/Time   CALCIUM 9.3 10/09/2018 0928   ALKPHOS 77 10/09/2018 0928   AST 19 10/09/2018 0928   ALT 12 10/09/2018 0928   BILITOT 0.3 10/09/2018 0928          Impression and Plan:   47-year-old woman with:  1.  Stage IV melanoma diagnosed in July 2019 with CNS disease as well as lymphadenopathy and peritoneal involvement.   She has tolerated Pembrolizumab without any major complications.  Risks and benefits of continuing this treatment long-term was discussed today.  Plan is to repeat imaging studies in June 2020 and decide on the duration of therapy subsequently.  She is agreeable to proceed with this plan.   2.  CNS metastasis: No evidence of relapsed disease at this time.  Last MRI obtained was in February 2020.  She is scheduled for repeat MRI in May 2020.  3.  Prognosis: Aggressive therapy is warranted given her excellent performance status although her disease is incurable.  4.  Psychosocial consideration: She continues to cope and manage reasonably well without any issues.  5.  Immune mediated complications: I continue to educate her about potential immune therapy complications.  These include dermatitis, colitis, pneumonitis and thyroid disease.  6.  Anemia: Related to blood loss at the time of diagnosis.  Hemoglobin has been stable we will continue to be monitored.  Hemoglobin remained stable and I asked her to discontinue oral iron for the time being.  Intravenous iron can be used in the future she develops worsening iron disease.  7.   Thrombocytopenia: Likely related to autoimmune etiology with platelet count relatively stable.  Continue to monitor at this time.  8.  Follow-up: We will be in 3 weeks for repeat evaluation and next treatment cycle.  25  minutes was spent with the patient face-to-face today.  More than 50% of time was dedicated to reviewing her disease status, treatment options and answering questions regarding future plan of care.    , MD 4/27/20209:25 AM 

## 2018-10-30 NOTE — Progress Notes (Signed)
Per Dr. Alen Blew it is ok to treat today with platelet count of 95.

## 2018-10-31 ENCOUNTER — Telehealth: Payer: Self-pay | Admitting: Oncology

## 2018-10-31 NOTE — Telephone Encounter (Signed)
Scheduled  appt per 4/27 sch message.

## 2018-11-06 ENCOUNTER — Other Ambulatory Visit: Payer: Self-pay | Admitting: Radiation Therapy

## 2018-11-09 ENCOUNTER — Ambulatory Visit
Admission: RE | Admit: 2018-11-09 | Discharge: 2018-11-09 | Disposition: A | Discharge status: Home / Self Care | Payer: Medicaid Other | Source: Ambulatory Visit | Attending: Radiation Oncology | Admitting: Radiation Oncology

## 2018-11-09 ENCOUNTER — Other Ambulatory Visit: Payer: Self-pay

## 2018-11-09 DIAGNOSIS — C7949 Secondary malignant neoplasm of other parts of nervous system: Principal | ICD-10-CM

## 2018-11-09 DIAGNOSIS — C7931 Secondary malignant neoplasm of brain: Secondary | ICD-10-CM

## 2018-11-09 MED ORDER — GADOBENATE DIMEGLUMINE 529 MG/ML IV SOLN
13.0000 mL | Freq: Once | INTRAVENOUS | Status: AC | PRN
  Administered 2018-11-09: 13 mL via INTRAVENOUS

## 2018-11-13 ENCOUNTER — Inpatient Hospital Stay: Payer: Medicaid Other | Attending: Obstetrics and Gynecology

## 2018-11-13 DIAGNOSIS — C7949 Secondary malignant neoplasm of other parts of nervous system: Secondary | ICD-10-CM | POA: Insufficient documentation

## 2018-11-13 DIAGNOSIS — C7931 Secondary malignant neoplasm of brain: Secondary | ICD-10-CM | POA: Insufficient documentation

## 2018-11-13 DIAGNOSIS — Z79899 Other long term (current) drug therapy: Secondary | ICD-10-CM | POA: Insufficient documentation

## 2018-11-13 DIAGNOSIS — C786 Secondary malignant neoplasm of retroperitoneum and peritoneum: Secondary | ICD-10-CM | POA: Insufficient documentation

## 2018-11-13 DIAGNOSIS — Z5112 Encounter for antineoplastic immunotherapy: Secondary | ICD-10-CM | POA: Insufficient documentation

## 2018-11-13 DIAGNOSIS — D696 Thrombocytopenia, unspecified: Secondary | ICD-10-CM | POA: Insufficient documentation

## 2018-11-13 DIAGNOSIS — C439 Malignant melanoma of skin, unspecified: Secondary | ICD-10-CM | POA: Insufficient documentation

## 2018-11-14 ENCOUNTER — Telehealth: Payer: Self-pay | Admitting: *Deleted

## 2018-11-14 NOTE — Telephone Encounter (Signed)
CALLED PATIENT TO INFORM THAT THE PROVIDER WILL BE CALLING HER TOMORROW 11-15-18 @ 2 PM, INSTEAD OF HER COMING IN, LVM FOR A RETURN CALL

## 2018-11-14 NOTE — Telephone Encounter (Signed)
Called patient to inform that [place] will call her @ 1 pm tomorrow, spoke with patient and she is aware of this

## 2018-11-15 ENCOUNTER — Telehealth: Payer: Self-pay | Admitting: Urology

## 2018-11-15 ENCOUNTER — Other Ambulatory Visit: Payer: Self-pay | Admitting: Urology

## 2018-11-15 ENCOUNTER — Ambulatory Visit
Admission: RE | Admit: 2018-11-15 | Discharge: 2018-11-15 | Disposition: A | Discharge status: Home / Self Care | Payer: Medicaid Other | Source: Ambulatory Visit | Attending: Urology | Admitting: Urology

## 2018-11-15 ENCOUNTER — Ambulatory Visit: Payer: Medicaid Other | Admitting: Urology

## 2018-11-15 ENCOUNTER — Other Ambulatory Visit: Payer: Self-pay

## 2018-11-15 DIAGNOSIS — C7931 Secondary malignant neoplasm of brain: Secondary | ICD-10-CM

## 2018-11-15 MED ORDER — SOLIFENACIN SUCCINATE 5 MG PO TABS: 5.0000 mg | ORAL_TABLET | Freq: Every day | ORAL | 5 refills | Status: DC

## 2018-11-15 MED ORDER — FAMOTIDINE 20 MG PO TABS: 20.0000 mg | ORAL_TABLET | Freq: Two times a day (BID) | ORAL | 5 refills | Status: DC

## 2018-11-15 MED FILL — SOLIFENACIN SUCCINATE 5 MG: 5 | 30 days supply | Qty: 30 | Fill #0

## 2018-11-15 NOTE — Progress Notes (Addendum)
Radiation Oncology         [phone]) [phone] ________________________________  Name: Kaitlyn Duncan MRN: [id number]  Date: 11/15/2018  DOB: 1971/01/21  Post Treatment Note  CC: [name], NP  [name], [name], NP  Diagnosis:   48 yo woman with over 109 brain metastases fromstage IVmelanoma  Interval Since Last Radiation:  9 months 02/06/2018 to 02/17/2018:   The whole brain was treated to 30 Gy in 10 fractions of 3 Gy  Narrative:  I spoke with the patient to conduct her routine scheduled 3 follow up visit and to discuss results from her recent follow up MRI via telephone to spare the patient unnecessary potential exposure in the healthcare setting during the current COVID-19 pandemic.  The patient was notified in advance and gave permission to proceed with this visit format. Her scan from 11/09/18 shows a stable appearance of the numerous small metastatic deposits throughout the brain which are stable to improved and numerous lesions throughout the scalp which have also improved from the original study.  Her case was reviewed in the multidisciplinary brain conference on 11/13/18 with recommendation to continue with 3 month surveillance scans. She continues on Pembrolizumab 200 mg every 3 weeks under the care and direction of Dr. Alen Blew- started 06/05/18 and reports that she continues to tolerate this well. She has developed a rash on her upper extremities which she is managing with increased moisturizer which seems to be helping.  She has completed 7 cycles and is is scheduled for her 8th cycle on 11/20/18 with plans to repeat systemic imaging prior to her 9th cycle in June 2020.  On review of systems, the patient states that she is doing well overall.  She has not had any significant headaches, tinnitus, dizziness/imbalance, tremor or seizure activity.  She denies chest pain, shortness of breath, wheezing, cough or hemoptysis.  She was able to discontinue her Keppra, initially prescribed as  prophylactic, as per direction of Dr. Mickeal Skinner in 09/2018.  She denies any seizure activity since discontinuation. She reports improved appetite and is maintaining her weight.  She denies N/V, constipation or diarrhea.  She has had some increased gas and indigestion over the past 4-6 weeks, persistent despite discontinuing ferrous sulfate.  She was initially treated with Mektovi 45 mg bid and [name] 450 mg daily systemic chemotherapy under the care and direction of Dr. Alen Blew- started on March 02, 2018 but this was discontinued in November 2019 after partial response.  She was started on Pembrolizumab 200 mg every 3 weeks- started 06/05/18 and she reports that she tolerates this well. She was last seen by Dr. Alen Blew on 10/30/18 and the plan is to continue her current immunotherapy with [facility] and repeat systemic imaging in June 2020. She has noted significant improvement, in fact, almost resolution of the palpable scalp lesions which she is quite pleased with. She has mild itching and dryness of her scalp and has developed a mildly itchy rash on her arms, present for the past month or so.  She is using moisturizer on her arms which is helping.  She has also noted a dry "patch" on the right side of her face, near her ear that has been present for the past 2 months and is not progressively enlarging and is non-itchy. Her PCP has recently switched her to Vesicare 5mg  po daily for management of her OAB and this is helping significantly reduce her nocturia and she is getting better rest.  She tolerates this medication well.  ALLERGIES:  has No Known Allergies.  Meds: Current Outpatient Medications  Medication Sig Dispense Refill  . acetaminophen (TYLENOL) 325 MG tablet Take 650 mg by mouth every 6 (six) hours as needed.    . cetirizine (ZYRTEC) 10 MG tablet Take 10 mg by mouth as needed for allergies (for seasonal allergies).    . ferrous sulfate 325 (65 FE) MG EC tablet Take 1 tablet (325 mg total) by mouth 2  (two) times daily. (Patient not taking: Reported on 10/30/2018) 90 tablet 3  . fluticasone (FLONASE) 50 MCG/ACT nasal spray Place 1 spray into both nostrils daily. 9.9 g 2  . Multiple Vitamin (MULTIVITAMIN WITH MINERALS) TABS tablet Take 1 tablet by mouth daily.    . mupirocin ointment (BACTROBAN) 2 % Place 1 application into the nose 2 (two) times daily. 30 g 1  . solifenacin (VESICARE) 5 MG tablet Take 5 mg by mouth daily.     No current facility-administered medications for this encounter.     Physical Findings:  vitals were not taken for this visit.  Unable to assess due to telehealth visit format.  Lab Findings: Lab Results  Component Value Date   WBC 4.4 10/30/2018   HGB 11.7 (L) 10/30/2018   HCT 38.3 10/30/2018   MCV 81.8 10/30/2018   PLT 95 (L) 10/30/2018    Radiographic Findings: Mr [name] QQ Contrast  Result Date: 11/09/2018 CLINICAL DATA:  Melanoma diagnosis July 2019 with metastatic disease. Whole brain radiation. Follow-up. EXAM: MRI HEAD WITHOUT AND WITH CONTRAST TECHNIQUE: Multiplanar, multiecho pulse sequences of the brain and surrounding structures were obtained without and with intravenous contrast. CONTRAST:  30mL MULTIHANCE GADOBENATE DIMEGLUMINE 529 MG/ML IV SOLN COMPARISON:  08/10/2018.  01/26/2018 FINDINGS: Brain: Generalized increased T2 signal affecting the cerebral hemispheric white matter consistent with sequela previous radiation. There are no new or progressive lesions. Multiple small metastases scattered throughout the brain evidenced by precontrast T1 hyperintensity. Many show susceptibility artifact. None have developed restricted diffusion or vasogenic edema. Two largest areas of involvement are in the medial thalamus on the right measuring 7.4 mm and at the parieto-occipital junction on the right measuring 9.9 mm. No evidence of hydrocephalus or extra-axial collection. Vascular: Major vessels at the base of the brain show flow. Skull and upper cervical  spine: No bone metastases seen. Sinuses/Orbits: Chronic opacification of the left maxillary sinus. Other sinuses are clear. Orbits negative. Other: Previous scalp lesions previously treated without evidence of progression or new finding. IMPRESSION: Increase in generalized T2 white matter signal consistent with radiation change. Stable small and punctate metastatic lesions throughout the brain, most evidenced by precontrast T1 hyperintensity and susceptibility artifact. There are no new or progressive lesions. Electronically Signed   By: Nelson Chimes M.D.   On: 11/09/2018 14:43    Impression/Plan: 1. 49 yo woman with over 50 brain metastases fromstage IVmelanoma. She has recovered well from the effects of her prior radiotherapy and remains clinically stable. We reviewed her recent MRI brain scan which shows a good response to treatment with a stable appearance of punctate enhancing lesions throughout both cerebral hemispheres and no new lesions noted.  Her case was presented and reviewed at recent multidisciplinary brain conference and consensus recommendation is to repeat an MRI brain scan in 3 months to continue to monitor her response to treatment and rule out any evidence of disease progression or recurrence.  We will continue to monitor closely with serial MRI brain scans every 3 months and she will also continue in routine  follow-up with Dr. Alen Blew for continued disease management with systemic therapy under his care and direction. Currently, the plan is to continue her immunotherapy with [facility] and repeat systemic imaging in June 2020. She appears to have a good understanding of her disease and our recommendations and is in agreement with the stated plan.  She knows to call at any time in the interim with any questions or concerns.  2. Nocturia.  Improved on Vesicare 5mg  po qhs.  I have refilled her prescription and we will continue to monitor success at upcoming visits.  3. GERD. She reports  increased gas and indigestion over the past 2 months which was previously controlled with Pepcid but she ran out of medication.  I have sent a Rx to her pharmacy and we will continue to follow her progress at upcoming visits.  I spent 25 minutes in telephone conversation with the patient and more than 50% of that time was spent in counseling and/or coordination of care.    Nicholos Johns, PA-C

## 2018-11-15 NOTE — Telephone Encounter (Signed)
error 

## 2018-11-17 ENCOUNTER — Other Ambulatory Visit: Payer: Self-pay | Admitting: Radiation Therapy

## 2018-11-17 DIAGNOSIS — C7931 Secondary malignant neoplasm of brain: Secondary | ICD-10-CM

## 2018-11-20 ENCOUNTER — Inpatient Hospital Stay: Payer: Medicaid Other

## 2018-11-20 ENCOUNTER — Other Ambulatory Visit: Payer: Self-pay

## 2018-11-20 ENCOUNTER — Inpatient Hospital Stay (HOSPITAL_BASED_OUTPATIENT_CLINIC_OR_DEPARTMENT_OTHER): Payer: Medicaid Other | Admitting: Oncology

## 2018-11-20 VITALS — BP 120/89 | HR 79 | Temp 98.5°F | Resp 18 | Ht 61.0 in | Wt 140.9 lb

## 2018-11-20 DIAGNOSIS — C439 Malignant melanoma of skin, unspecified: Secondary | ICD-10-CM

## 2018-11-20 DIAGNOSIS — C786 Secondary malignant neoplasm of retroperitoneum and peritoneum: Secondary | ICD-10-CM | POA: Diagnosis not present

## 2018-11-20 DIAGNOSIS — Z5112 Encounter for antineoplastic immunotherapy: Secondary | ICD-10-CM | POA: Diagnosis not present

## 2018-11-20 DIAGNOSIS — C7949 Secondary malignant neoplasm of other parts of nervous system: Secondary | ICD-10-CM | POA: Diagnosis not present

## 2018-11-20 DIAGNOSIS — C7931 Secondary malignant neoplasm of brain: Secondary | ICD-10-CM | POA: Diagnosis not present

## 2018-11-20 DIAGNOSIS — D696 Thrombocytopenia, unspecified: Secondary | ICD-10-CM | POA: Diagnosis not present

## 2018-11-20 DIAGNOSIS — Z79899 Other long term (current) drug therapy: Secondary | ICD-10-CM

## 2018-11-20 LAB — CBC WITH DIFFERENTIAL (CANCER CENTER ONLY)
Abs Immature Granulocytes: 0.01 10*3/uL (ref 0.00–0.07)
Basophils Absolute: 0 10*3/uL (ref 0.0–0.1)
Basophils Relative: 0 %
Eosinophils Absolute: 0 10*3/uL (ref 0.0–0.5)
Eosinophils Relative: 0 %
HCT: 39.1 % (ref 36.0–46.0)
Hemoglobin: 12.1 g/dL (ref 12.0–15.0)
Immature Granulocytes: 0 %
Lymphocytes Relative: 19 %
Lymphs Abs: 0.8 10*3/uL (ref 0.7–4.0)
MCH: 25.2 pg — ABNORMAL LOW (ref 26.0–34.0)
MCHC: 30.9 g/dL (ref 30.0–36.0)
MCV: 81.5 fL (ref 80.0–100.0)
Monocytes Absolute: 0.4 10*3/uL (ref 0.1–1.0)
Monocytes Relative: 9 %
Neutro Abs: 3.1 10*3/uL (ref 1.7–7.7)
Neutrophils Relative %: 72 %
Platelet Count: 91 10*3/uL — ABNORMAL LOW (ref 150–400)
RBC: 4.8 MIL/uL (ref 3.87–5.11)
RDW: 15.9 % — ABNORMAL HIGH (ref 11.5–15.5)
WBC Count: 4.4 10*3/uL (ref 4.0–10.5)
nRBC: 0 % (ref 0.0–0.2)

## 2018-11-20 LAB — CMP (CANCER CENTER ONLY)
ALT: 13 U/L (ref 0–44)
AST: 17 U/L (ref 15–41)
Albumin: 4 g/dL (ref 3.5–5.0)
Alkaline Phosphatase: 81 U/L (ref 38–126)
Anion gap: 11 (ref 5–15)
BUN: 20 mg/dL (ref 6–20)
CO2: 21 mmol/L — ABNORMAL LOW (ref 22–32)
Calcium: 9.2 mg/dL (ref 8.9–10.3)
Chloride: 109 mmol/L (ref 98–111)
Creatinine: 0.81 mg/dL (ref 0.44–1.00)
GFR, Est AFR Am: >60 mL/min (ref >60)
GFR, Estimated: >60 mL/min (ref >60)
Glucose, Bld: 174 mg/dL — ABNORMAL HIGH (ref 70–99)
Potassium: 3.6 mmol/L (ref 3.5–5.1)
Sodium: 141 mmol/L (ref 135–145)
Total Bilirubin: 0.2 mg/dL — ABNORMAL LOW (ref 0.3–1.2)
Total Protein: 7.4 g/dL (ref 6.5–8.1)

## 2018-11-20 MED ORDER — SODIUM CHLORIDE 0.9 % IV SOLN
Freq: Once | INTRAVENOUS | Status: AC
  Administered 2018-11-20: 11:00:00 via INTRAVENOUS
  Filled 2018-11-20: qty 250

## 2018-11-20 MED ORDER — SODIUM CHLORIDE 0.9 % IV SOLN
200.0000 mg | Freq: Once | INTRAVENOUS | Status: AC
  Administered 2018-11-20: 12:00:00 200 mg via INTRAVENOUS
  Filled 2018-11-20: qty 8

## 2018-11-20 NOTE — Progress Notes (Signed)
Per Dr. Alen Blew okay to treat patient with Plt count of 91

## 2018-11-20 NOTE — Patient Instructions (Signed)
[facility] Discharge Instructions for Patients Receiving Chemotherapy  Today you received the following chemotherapy agents Pembrolizumab (KEYTRUDA).  To help prevent nausea and vomiting after your treatment, we encourage you to take your nausea medication as prescribed.   If you develop nausea and vomiting that is not controlled by your nausea medication, call the clinic.   BELOW ARE SYMPTOMS THAT SHOULD BE REPORTED IMMEDIATELY:  *FEVER GREATER THAN 100.5 F  *CHILLS WITH OR WITHOUT FEVER  NAUSEA AND VOMITING THAT IS NOT CONTROLLED WITH YOUR NAUSEA MEDICATION  *UNUSUAL SHORTNESS OF BREATH  *UNUSUAL BRUISING OR BLEEDING  TENDERNESS IN MOUTH AND THROAT WITH OR WITHOUT PRESENCE OF ULCERS  *URINARY PROBLEMS  *BOWEL PROBLEMS  UNUSUAL RASH Items with * indicate a potential emergency and should be followed up as soon as possible.  Feel free to call the clinic should you have any questions or concerns. The clinic phone number is [phone].  Please show the CHEMO ALERT CARD at check-in to the Emergency Department and triage nurse.  Coronavirus (COVID-19) Are you at risk?  Are you at risk for the Coronavirus (COVID-19)?  To be considered HIGH RISK for Coronavirus (COVID-19), you have to meet the following criteria:  . Traveled to [place], [place], [place], [place] or [place]; or in the [place] to [place], [place], [place], or [place]; and have fever, cough, and shortness of breath within the last 2 weeks of travel OR . Been in close contact with a person diagnosed with COVID-19 within the last 2 weeks and have fever, cough, and shortness of breath . IF YOU DO NOT MEET THESE CRITERIA, YOU ARE CONSIDERED LOW RISK FOR COVID-19.  What to do if you are HIGH RISK for COVID-19?  . If you are having a medical emergency, call 911. . Seek medical care right away. Before you go to a doctor's office, urgent care or emergency department, call ahead and tell  them about your recent travel, contact with someone diagnosed with COVID-19, and your symptoms. You should receive instructions from your physician's office regarding next steps of care.  . When you arrive at healthcare provider, tell the healthcare staff immediately you have returned from visiting [place], [place], [place], [place] or [place]; or traveled in the [place] to [place], [place], [place], or [place]; in the last two weeks or you have been in close contact with a person diagnosed with COVID-19 in the last 2 weeks.   . Tell the health care staff about your symptoms: fever, cough and shortness of breath. . After you have been seen by a medical provider, you will be either: o Tested for (COVID-19) and discharged home on quarantine except to seek medical care if symptoms worsen, and asked to  - Stay home and avoid contact with others until you get your results (4-5 days)  - Avoid travel on public transportation if possible (such as bus, train, or airplane) or o Sent to the Emergency Department by EMS for evaluation, COVID-19 testing, and possible admission depending on your condition and test results.  What to do if you are LOW RISK for COVID-19?  Reduce your risk of any infection by using the same precautions used for avoiding the common cold or flu:  . Wash your hands often with soap and warm water for at least 20 seconds.  If soap and water are not readily available, use an alcohol-based hand sanitizer with at least 60% alcohol.  . If coughing or   sneezing, cover your mouth and nose by coughing or sneezing into the elbow areas of your shirt or coat, into a tissue or into your sleeve (not your hands). . Avoid shaking hands with others and consider head nods or verbal greetings only. . Avoid touching your eyes, nose, or mouth with unwashed hands.  . Avoid close contact with people who are sick. . Avoid places or events with large numbers of people in one location, like concerts or  sporting events. . Carefully consider travel plans you have or are making. . If you are planning any travel outside or inside the [place], visit the CDC's Travelers' Health webpage for the latest health notices. . If you have some symptoms but not all symptoms, continue to monitor at home and seek medical attention if your symptoms worsen. . If you are having a medical emergency, call 911.   ADDITIONAL HEALTHCARE OPTIONS FOR PATIENTS  [facility] Telehealth / e-Visit: [email or web address]         [facility]: [phone]  [facility]: [phone]                   [facility]: [phone]    

## 2018-11-20 NOTE — Progress Notes (Signed)
Hematology and Oncology Follow Up Visit  Kaitlyn Duncan [id number] [birth year] 48 y.o. 11/20/2018 9:30 AM Kaitlyn Duncan, NPStrup, Kaitlyn Bears, NP   Principle Diagnosis: 48 year old woman with stage IV melanoma with disease into the brain, peritoneum and lymphadenopathy diagnosed in July 2019.  She was found to have BRAF positive tumor.  Prior Therapy: He is status post subcutaneous nodule biopsy on 01/27/2018 which confirmed the presence of metastatic melanoma. She status post endoscopy on 01/28/2018 which showed subcutaneous involvement that consistent with metastatic melanoma.  Whole brain radiation for total of 30 Gy in 10 fractions started on 02/07/2018.   Mektovi 45 mg bid, [name] 450 mg daily started in August 2019.  Therapy discontinued in November 2019 after partial response.  Current therapy: Pembrolizumab 200 mg every 3 weeks started on 06/05/2018.  She is here for cycle 8.    Interim History: Kaitlyn Duncan returns today for a repeat evaluation.  Since last visit, she reports no major changes in her health.  She does report periodic indigestion and constipation but no diarrhea or abdominal pain.  She denies any mucus production.  She eats reasonably well and continues to enjoy excellent performance status.  She denies any recent hospitalizations or illnesses.  She denied headaches, blurry vision, syncope or seizures.  Denies any fevers, chills or sweats.  Denied chest pain, palpitation, orthopnea or leg edema.  Denied cough, wheezing or hemoptysis.  Denied nausea, vomiting or abdominal pain.  Denies any constipation or diarrhea.  Denies any frequency urgency or hesitancy.  Denies any arthralgias or myalgias.  Denies any skin rashes or lesions.  Denies any bleeding or clotting tendency.  Denies any easy bruising.  Denies any hair or nail changes.  Denies any anxiety or depression.  Remaining review of system is negative.             Medications: I have reviewed the patient's  current medications.  Current Outpatient Medications  Medication Sig Dispense Refill  . acetaminophen (TYLENOL) 325 MG tablet Take 650 mg by mouth every 6 (six) hours as needed.    . cetirizine (ZYRTEC) 10 MG tablet Take 10 mg by mouth as needed for allergies (for seasonal allergies).    . famotidine (PEPCID) 20 MG tablet Take 1 tablet (20 mg total) by mouth 2 (two) times daily. 60 tablet 5  . ferrous sulfate 325 (65 FE) MG EC tablet Take 1 tablet (325 mg total) by mouth 2 (two) times daily. (Patient not taking: Reported on 10/30/2018) 90 tablet 3  . fluticasone (FLONASE) 50 MCG/ACT nasal spray Place 1 spray into both nostrils daily. 9.9 g 2  . Multiple Vitamin (MULTIVITAMIN WITH MINERALS) TABS tablet Take 1 tablet by mouth daily.    . mupirocin ointment (BACTROBAN) 2 % Place 1 application into the nose 2 (two) times daily. 30 g 1  . solifenacin (VESICARE) 5 MG tablet Take 1 tablet (5 mg total) by mouth daily. 30 tablet 5   No current facility-administered medications for this visit.      Allergies: No Known Allergies  Past Medical History, Surgical history, Social history, and Family History were reviewed and updated.  Review of Systems:    Physical Exam:    Blood pressure 120/89, pulse 79, temperature 98.5 F (36.9 C), temperature source Oral, resp. rate 18, height _0  (1.549 m), weight 140 lb 14.4 oz (63.9 kg), SpO2 98 %.     ECOG: 1    General appearance: Comfortable appearing without any discomfort Head: Normocephalic without any trauma  Oropharynx: Mucous membranes are moist and pink without any thrush or ulcers. Eyes: Pupils are equal and round reactive to light. Lymph nodes: No cervical, supraclavicular, inguinal or axillary lymphadenopathy.   Heart:regular rate and rhythm.  S1 and S2 without leg edema. Lung: Clear without any rhonchi or wheezes.  No dullness to percussion. Abdomin: Soft, nontender, nondistended with good bowel sounds.  No  hepatosplenomegaly. Musculoskeletal: No joint deformity or effusion.  Full range of motion noted. Neurological: No deficits noted on motor, sensory and deep tendon reflex exam. Skin: No petechial rash or dryness.  Appeared moist.              Lab Results: Lab Results  Component Value Date   WBC 4.4 10/30/2018   HGB 11.7 (L) 10/30/2018   HCT 38.3 10/30/2018   MCV 81.8 10/30/2018   PLT 95 (L) 10/30/2018     Chemistry      Component Value Date/Time   NA 140 10/30/2018 0916   K 3.7 10/30/2018 0916   CL 110 10/30/2018 0916   CO2 20 (L) 10/30/2018 0916   BUN 20 10/30/2018 0916   CREATININE 0.77 10/30/2018 0916      Component Value Date/Time   CALCIUM 8.8 (L) 10/30/2018 0916   ALKPHOS 77 10/30/2018 0916   AST 21 10/30/2018 0916   ALT 11 10/30/2018 0916   BILITOT 0.2 (L) 10/30/2018 0916          Impression and Plan:   48 year old woman with:  1.  Melanoma of unknown primary with stage IV disease involvement in the CNS as well as peritoneal involvement.  She is currently on Pembrolizumab without any major complications.  Risks and benefits of continuing this therapy was reviewed today and she is agreeable to continue.  She will receive treatment today in which she will have repeat imaging studies in June.  The natural course of her disease was also discussed and a possibility of long-term treatment is needed given her stage IV disease.   2.  CNS metastasis: MRI of the brain on Nov 09, 2018 showed no evidence of disease relapse.  This will be repeated in 3 months.  3.  Prognosis: Therapy remains palliative at this time although aggressive therapy is warranted given her excellent performance status.  4.  Psychosocial consideration: No issues or considerations at this time.  She has adequate resources to receive treatment.  5.  Immune mediated complications: Long-term complications associated with this therapy were reviewed.  These would include pneumonitis,  colitis and thyroid disease.  6.  Anemia: Resolved at this time with hemoglobin is adequate.  Iron has been discontinued.  7.  Thrombocytopenia: Due to immune etiology without any complications at this time.  No active bleeding is noted.  8.  Follow-up: In 3 weeks for repeat evaluation and discussion of CT scan results.  25  minutes was spent with the patient face-to-face today.  More than 50% of time was spent on reviewing her disease status, treatment options and answering questions about prognosis and future plan of care.   Zola Button, MD 5/18/20209:30 AM

## 2018-11-22 ENCOUNTER — Telehealth: Payer: Self-pay | Admitting: Radiation Therapy

## 2018-11-22 NOTE — Telephone Encounter (Signed)
Spoke with Kaitlyn Duncan about her August brain MRI and follow-up with Ashlyn. She was already aware of these visits due to MyChart and plans to attend.   Mont Dutton R.T.(R)(T) Special Procedures Navigator

## 2018-11-23 ENCOUNTER — Other Ambulatory Visit: Payer: Self-pay | Admitting: Urology

## 2018-11-23 MED ORDER — CIMETIDINE 400 MG PO TABS: 400.0000 mg | ORAL_TABLET | Freq: Two times a day (BID) | ORAL | 5 refills | Status: DC

## 2018-11-23 MED FILL — CIMETIDINE 400 MG TABLET: 400 | 30 days supply | Qty: 60 | Fill #0

## 2018-11-24 ENCOUNTER — Telehealth: Payer: Self-pay | Admitting: Radiation Oncology

## 2018-11-24 NOTE — Addendum Note (Signed)
Encounter addended by: Harmsen Caldron, PA-C on: 11/24/2018 11:24 AM  Actions taken: Clinical Note Signed

## 2018-11-24 NOTE — Telephone Encounter (Signed)
-----   Message from Faley Caldron, [place] sent at 11/23/2018 12:22 PM EDT ----- Regarding: FW: SCRIPT Please call and let [name] know that I sent a Rx for Tagamet to [facility] since they did not have the Pepcid I had originally prescribed. Thank you!!! -Ashlyn ----- Message ----- From: [name] Sent: 11/23/2018  11:39 AM EDT To: Kerin Caldron, PA-C Subject: SCRIPT                                         Hi Ashlyn,   This patient states that she needs Pepcid.   Thanks,  [employer]

## 2018-11-24 NOTE — Telephone Encounter (Signed)
Phoned patient as requested by Stapleton Caldron, PA-C. No answer on her cell. Left message that a script for Tagamet has been sent to [facility] since they did not have Pepcid as originally prescribed. Left my direct contact number and encouraged she call with future questions.

## 2018-12-07 ENCOUNTER — Ambulatory Visit
Admission: RE | Admit: 2018-12-07 | Discharge: 2018-12-07 | Disposition: A | Discharge status: Home / Self Care | Payer: Medicaid Other | Source: Ambulatory Visit | Attending: Oncology | Admitting: Oncology

## 2018-12-07 ENCOUNTER — Other Ambulatory Visit: Payer: Self-pay

## 2018-12-07 DIAGNOSIS — C439 Malignant melanoma of skin, unspecified: Secondary | ICD-10-CM

## 2018-12-07 MED ORDER — IOPAMIDOL (ISOVUE-300) INJECTION 61%
100.0000 mL | Freq: Once | INTRAVENOUS | Status: AC | PRN
  Administered 2018-12-07: 100 mL via INTRAVENOUS

## 2018-12-11 ENCOUNTER — Inpatient Hospital Stay: Payer: Medicaid Other | Attending: Obstetrics and Gynecology

## 2018-12-11 ENCOUNTER — Inpatient Hospital Stay: Payer: Medicaid Other

## 2018-12-11 ENCOUNTER — Other Ambulatory Visit: Payer: Self-pay

## 2018-12-11 ENCOUNTER — Inpatient Hospital Stay (HOSPITAL_BASED_OUTPATIENT_CLINIC_OR_DEPARTMENT_OTHER): Payer: Medicaid Other | Admitting: Oncology

## 2018-12-11 VITALS — BP 122/79 | HR 75 | Temp 98.9°F | Resp 17 | Ht 61.0 in | Wt 138.6 lb

## 2018-12-11 DIAGNOSIS — C7800 Secondary malignant neoplasm of unspecified lung: Secondary | ICD-10-CM | POA: Diagnosis not present

## 2018-12-11 DIAGNOSIS — Z5112 Encounter for antineoplastic immunotherapy: Secondary | ICD-10-CM

## 2018-12-11 DIAGNOSIS — K529 Noninfective gastroenteritis and colitis, unspecified: Secondary | ICD-10-CM | POA: Insufficient documentation

## 2018-12-11 DIAGNOSIS — C786 Secondary malignant neoplasm of retroperitoneum and peritoneum: Secondary | ICD-10-CM | POA: Insufficient documentation

## 2018-12-11 DIAGNOSIS — Z8249 Family history of ischemic heart disease and other diseases of the circulatory system: Secondary | ICD-10-CM

## 2018-12-11 DIAGNOSIS — R591 Generalized enlarged lymph nodes: Secondary | ICD-10-CM

## 2018-12-11 DIAGNOSIS — H9201 Otalgia, right ear: Secondary | ICD-10-CM | POA: Insufficient documentation

## 2018-12-11 DIAGNOSIS — Z833 Family history of diabetes mellitus: Secondary | ICD-10-CM | POA: Diagnosis not present

## 2018-12-11 DIAGNOSIS — K5900 Constipation, unspecified: Secondary | ICD-10-CM | POA: Insufficient documentation

## 2018-12-11 DIAGNOSIS — D696 Thrombocytopenia, unspecified: Secondary | ICD-10-CM

## 2018-12-11 DIAGNOSIS — Z803 Family history of malignant neoplasm of breast: Secondary | ICD-10-CM | POA: Diagnosis not present

## 2018-12-11 DIAGNOSIS — D649 Anemia, unspecified: Secondary | ICD-10-CM

## 2018-12-11 DIAGNOSIS — E119 Type 2 diabetes mellitus without complications: Secondary | ICD-10-CM | POA: Diagnosis not present

## 2018-12-11 DIAGNOSIS — C7931 Secondary malignant neoplasm of brain: Secondary | ICD-10-CM | POA: Diagnosis not present

## 2018-12-11 DIAGNOSIS — C439 Malignant melanoma of skin, unspecified: Secondary | ICD-10-CM

## 2018-12-11 DIAGNOSIS — E079 Disorder of thyroid, unspecified: Secondary | ICD-10-CM | POA: Insufficient documentation

## 2018-12-11 DIAGNOSIS — J189 Pneumonia, unspecified organism: Secondary | ICD-10-CM | POA: Insufficient documentation

## 2018-12-11 DIAGNOSIS — R5383 Other fatigue: Secondary | ICD-10-CM | POA: Diagnosis not present

## 2018-12-11 DIAGNOSIS — D259 Leiomyoma of uterus, unspecified: Secondary | ICD-10-CM | POA: Diagnosis not present

## 2018-12-11 LAB — CMP (CANCER CENTER ONLY)
ALT: 9 U/L (ref 0–44)
AST: 19 U/L (ref 15–41)
Albumin: 4 g/dL (ref 3.5–5.0)
Alkaline Phosphatase: 70 U/L (ref 38–126)
Anion gap: 12 (ref 5–15)
BUN: 12 mg/dL (ref 6–20)
CO2: 19 mmol/L — ABNORMAL LOW (ref 22–32)
Calcium: 8.9 mg/dL (ref 8.9–10.3)
Chloride: 108 mmol/L (ref 98–111)
Creatinine: 0.87 mg/dL (ref 0.44–1.00)
GFR, Est AFR Am: >60 mL/min (ref >60)
GFR, Estimated: >60 mL/min (ref >60)
Glucose, Bld: 138 mg/dL — ABNORMAL HIGH (ref 70–99)
Potassium: 3.7 mmol/L (ref 3.5–5.1)
Sodium: 139 mmol/L (ref 135–145)
Total Bilirubin: 0.3 mg/dL (ref 0.3–1.2)
Total Protein: 6.9 g/dL (ref 6.5–8.1)

## 2018-12-11 LAB — CBC WITH DIFFERENTIAL (CANCER CENTER ONLY)
Abs Immature Granulocytes: 0.01 10*3/uL (ref 0.00–0.07)
Basophils Absolute: 0 10*3/uL (ref 0.0–0.1)
Basophils Relative: 0 %
Eosinophils Absolute: 0 10*3/uL (ref 0.0–0.5)
Eosinophils Relative: 0 %
HCT: 38.6 % (ref 36.0–46.0)
Hemoglobin: 12.2 g/dL (ref 12.0–15.0)
Immature Granulocytes: 0 %
Lymphocytes Relative: 25 %
Lymphs Abs: 1.1 10*3/uL (ref 0.7–4.0)
MCH: 25.9 pg — ABNORMAL LOW (ref 26.0–34.0)
MCHC: 31.6 g/dL (ref 30.0–36.0)
MCV: 82 fL (ref 80.0–100.0)
Monocytes Absolute: 0.6 10*3/uL (ref 0.1–1.0)
Monocytes Relative: 12 %
Neutro Abs: 2.8 10*3/uL (ref 1.7–7.7)
Neutrophils Relative %: 63 %
Platelet Count: 76 10*3/uL — ABNORMAL LOW (ref 150–400)
RBC: 4.71 MIL/uL (ref 3.87–5.11)
RDW: 14.5 % (ref 11.5–15.5)
WBC Count: 4.4 10*3/uL (ref 4.0–10.5)
nRBC: 0 % (ref 0.0–0.2)

## 2018-12-11 MED ORDER — SODIUM CHLORIDE 0.9 % IV SOLN
200.0000 mg | Freq: Once | INTRAVENOUS | Status: AC
  Administered 2018-12-11: 200 mg via INTRAVENOUS
  Filled 2018-12-11: qty 8

## 2018-12-11 MED ORDER — SODIUM CHLORIDE 0.9 % IV SOLN
Freq: Once | INTRAVENOUS | Status: AC
  Administered 2018-12-11: 11:00:00 via INTRAVENOUS
  Filled 2018-12-11: qty 250

## 2018-12-11 NOTE — Patient Instructions (Signed)
[facility] Discharge Instructions for Patients Receiving Chemotherapy  Today you received the following chemotherapy agents Pembrolizumab (KEYTRUDA).  To help prevent nausea and vomiting after your treatment, we encourage you to take your nausea medication as prescribed.   If you develop nausea and vomiting that is not controlled by your nausea medication, call the clinic.   BELOW ARE SYMPTOMS THAT SHOULD BE REPORTED IMMEDIATELY:  *FEVER GREATER THAN 100.5 F  *CHILLS WITH OR WITHOUT FEVER  NAUSEA AND VOMITING THAT IS NOT CONTROLLED WITH YOUR NAUSEA MEDICATION  *UNUSUAL SHORTNESS OF BREATH  *UNUSUAL BRUISING OR BLEEDING  TENDERNESS IN MOUTH AND THROAT WITH OR WITHOUT PRESENCE OF ULCERS  *URINARY PROBLEMS  *BOWEL PROBLEMS  UNUSUAL RASH Items with * indicate a potential emergency and should be followed up as soon as possible.  Feel free to call the clinic should you have any questions or concerns. The clinic phone number is [phone].  Please show the CHEMO ALERT CARD at check-in to the Emergency Department and triage nurse.  Coronavirus (COVID-19) Are you at risk?  Are you at risk for the Coronavirus (COVID-19)?  To be considered HIGH RISK for Coronavirus (COVID-19), you have to meet the following criteria:  . Traveled to [place], [place], [place], [place] or [place]; or in the [place] to [place], [place], [place], or [place]; and have fever, cough, and shortness of breath within the last 2 weeks of travel OR . Been in close contact with a person diagnosed with COVID-19 within the last 2 weeks and have fever, cough, and shortness of breath . IF YOU DO NOT MEET THESE CRITERIA, YOU ARE CONSIDERED LOW RISK FOR COVID-19.  What to do if you are HIGH RISK for COVID-19?  . If you are having a medical emergency, call 911. . Seek medical care right away. Before you go to a doctor's office, urgent care or emergency department, call ahead and tell  them about your recent travel, contact with someone diagnosed with COVID-19, and your symptoms. You should receive instructions from your physician's office regarding next steps of care.  . When you arrive at healthcare provider, tell the healthcare staff immediately you have returned from visiting [place], [place], [place], [place] or [place]; or traveled in the [place] to [place], [place], [place], or [place]; in the last two weeks or you have been in close contact with a person diagnosed with COVID-19 in the last 2 weeks.   . Tell the health care staff about your symptoms: fever, cough and shortness of breath. . After you have been seen by a medical provider, you will be either: o Tested for (COVID-19) and discharged home on quarantine except to seek medical care if symptoms worsen, and asked to  - Stay home and avoid contact with others until you get your results (4-5 days)  - Avoid travel on public transportation if possible (such as bus, train, or airplane) or o Sent to the Emergency Department by EMS for evaluation, COVID-19 testing, and possible admission depending on your condition and test results.  What to do if you are LOW RISK for COVID-19?  Reduce your risk of any infection by using the same precautions used for avoiding the common cold or flu:  . Wash your hands often with soap and warm water for at least 20 seconds.  If soap and water are not readily available, use an alcohol-based hand sanitizer with at least 60% alcohol.  . If coughing or   sneezing, cover your mouth and nose by coughing or sneezing into the elbow areas of your shirt or coat, into a tissue or into your sleeve (not your hands). . Avoid shaking hands with others and consider head nods or verbal greetings only. . Avoid touching your eyes, nose, or mouth with unwashed hands.  . Avoid close contact with people who are sick. . Avoid places or events with large numbers of people in one location, like concerts or  sporting events. . Carefully consider travel plans you have or are making. . If you are planning any travel outside or inside the [place], visit the CDC's Travelers' Health webpage for the latest health notices. . If you have some symptoms but not all symptoms, continue to monitor at home and seek medical attention if your symptoms worsen. . If you are having a medical emergency, call 911.   ADDITIONAL HEALTHCARE OPTIONS FOR PATIENTS  [facility] / e-Visit: [email or web address]         [facility]: [phone]  [facility]: [phone]                   [facility]: [phone]    

## 2018-12-11 NOTE — Progress Notes (Signed)
Per Dr. Alen Blew, ok to treat with Platelets 76.

## 2018-12-11 NOTE — Progress Notes (Signed)
Hematology and Oncology Follow Up Visit  Kaitlyn Duncan [id number] 13-Sep-1970 48 y.o. 12/11/2018 8:55 AM [name], [name], [name], NP   Principle Diagnosis: 48 year old woman with melanoma of unknown primary after presenting with stage IV disease and documented metastasis to brain, peritoneum and lymphadenopathy diagnosed in July 2019.  She her tumor is BRAF positive.   Prior Therapy: He is status post subcutaneous nodule biopsy on 01/27/2018 which confirmed the presence of metastatic melanoma. She status post endoscopy on 01/28/2018 which showed subcutaneous involvement that consistent with metastatic melanoma.  Whole brain radiation for total of 30 Gy in 10 fractions started on 02/07/2018.   Mektovi 45 mg bid, [name] 450 mg daily started in August 2019.  Therapy discontinued in November 2019 after partial response.  Current therapy: Pembrolizumab 200 mg every 3 weeks started on 06/05/2018.  She is here for cycle 9.    Interim History: Ms. Mezquita is here for a follow-up.  Since the last visit, she reports no major changes in her health.  He continues to tolerate Pembrolizumab without any major complications.  She denies any nausea, fatigue or major skin changes.  She does report some occasional pruritus and dryness but no rash.  She denies any respiratory complaints or change in her bowel habits.  She denies any neurological deficits.  Continues to ambulate without any major difficulties and attends activities of daily living.   She denied any alteration mental status, neuropathy, confusion or dizziness.  Denies any headaches or lethargy.  Denies any night sweats, weight loss or changes in appetite.  Denied orthopnea, dyspnea on exertion or chest discomfort.  Denies shortness of breath, difficulty breathing hemoptysis or cough.  Denies any abdominal distention, nausea, early satiety or dyspepsia.  Denies any hematuria, frequency, dysuria or nocturia.  Denies any skin irritation,  dryness or rash.  Denies any ecchymosis or petechiae.  Denies any lymphadenopathy or clotting.  Denies any heat or cold intolerance.  Denies any anxiety or depression.  Remaining review of system is negative.                  Medications: I have reviewed the patient's current medications.  Current Outpatient Medications  Medication Sig Dispense Refill  . acetaminophen (TYLENOL) 325 MG tablet Take 650 mg by mouth every 6 (six) hours as needed.    . cetirizine (ZYRTEC) 10 MG tablet Take 10 mg by mouth as needed for allergies (for seasonal allergies).    . cimetidine (TAGAMET) 400 MG tablet Take 1 tablet (400 mg total) by mouth 2 (two) times daily. 60 tablet 5  . ferrous sulfate 325 (65 FE) MG EC tablet Take 1 tablet (325 mg total) by mouth 2 (two) times daily. (Patient not taking: Reported on 10/30/2018) 90 tablet 3  . fluticasone (FLONASE) 50 MCG/ACT nasal spray Place 1 spray into both nostrils daily. 9.9 g 2  . Multiple Vitamin (MULTIVITAMIN WITH MINERALS) TABS tablet Take 1 tablet by mouth daily.    . mupirocin ointment (BACTROBAN) 2 % Place 1 application into the nose 2 (two) times daily. 30 g 1  . Sennosides (LAXATIVE PILLS) 15 MG TABS Take 15 mg by mouth daily.    . solifenacin (VESICARE) 5 MG tablet Take 1 tablet (5 mg total) by mouth daily. 30 tablet 5   No current facility-administered medications for this visit.      Allergies: No Known Allergies  Past Medical History, Surgical history, Social history, and Family History were reviewed and updated.  Review of  Systems:    Physical Exam:   Blood pressure 122/79, pulse 75, temperature 98.9 F (37.2 C), temperature source Oral, resp. rate 17, height _0  (1.549 m), weight 138 lb 9.6 oz (62.9 kg), SpO2 99 %.       ECOG: 1    General appearance: Alert, awake without any distress. Head: Atraumatic without abnormalities Oropharynx: Without any thrush or ulcers. Eyes: No scleral icterus. Lymph nodes: No  lymphadenopathy noted in the cervical, supraclavicular, or axillary nodes Heart:regular rate and rhythm, without any murmurs or gallops.   Lung: Clear to auscultation without any rhonchi, wheezes or dullness to percussion. Abdomin: Soft, nontender without any shifting dullness or ascites. Musculoskeletal: No clubbing or cyanosis. Neurological: No motor or sensory deficits. Skin: No rashes or lesions.              Lab Results: Lab Results  Component Value Date   WBC 4.4 11/20/2018   HGB 12.1 11/20/2018   HCT 39.1 11/20/2018   MCV 81.5 11/20/2018   PLT 91 (L) 11/20/2018     Chemistry      Component Value Date/Time   NA 141 11/20/2018 0914   K 3.6 11/20/2018 0914   CL 109 11/20/2018 0914   CO2 21 (L) 11/20/2018 0914   BUN 20 11/20/2018 0914   CREATININE 0.81 11/20/2018 0914      Component Value Date/Time   CALCIUM 9.2 11/20/2018 0914   ALKPHOS 81 11/20/2018 0914   AST 17 11/20/2018 0914   ALT 13 11/20/2018 0914   BILITOT 0.2 (L) 11/20/2018 0914       FINDINGS: CT CHEST FINDINGS  Cardiovascular: No acute findings.  Mediastinum/Lymph Nodes: No masses or pathologically enlarged lymph nodes identified.  Lungs/Pleura: Multiple scattered sub-cm pulmonary nodules in both lungs show no significant change. No new or enlarging pulmonary nodules or masses identified. No evidence of pleural effusion.  Musculoskeletal:  No suspicious bone lesions identified.  CT ABDOMEN AND PELVIS FINDINGS  Hepatobiliary: No hepatic masses are identified. Enhancing mass in the gallbladder lumen measuring approximately 4 cm shows no significant change. No evidence of cholecystitis or biliary ductal dilatation.  Pancreas:  No mass or inflammatory changes.  Spleen: No evidence of splenomegaly. 9 mm hypervascular splenic lesion is stable and suspicious for splenic metastasis. No new or enlarging splenic lesions identified.  Adrenals/Urinary tract: Bilateral  heterogeneously enhancing adrenal masses are again seen measuring 2.4 cm on the right, and 2.6 cm on the left. These are consistent with adrenal metastases, and show no significant change since previous study. No evidence of renal masses or hydronephrosis.  Stomach/Bowel: No evidence of obstruction, inflammatory process, or abnormal fluid collections.  Vascular/Lymphatic: No pathologically enlarged lymph nodes identified. No abdominal aortic aneurysm.  Reproductive: 4 cm intramural fibroid in the left uterine corpus remains stable. Tiny fibroid in the uterine fundus is also stable. Enhancing soft tissue density in the endometrial cavity shows no significant change. Low-attenuation mass in the right adnexa measures 3.9 x 3.1 cm, without significant change in size since previous study. Stable small cystic lesion in left adnexa has tubular appearance, suspicious for hydrosalpinx. No evidence of ascites.  Other: 7 mm subcutaneous soft tissue nodule in the right pelvis is stable.  Musculoskeletal: Mixed lytic and sclerotic bone lesion in the right sacrum is stable in appearance. Benign hemangioma in T12 vertebral body is also unchanged.  IMPRESSION: 1. No significant change in widespread metastatic disease involving the lungs, gallbladder, spleen, bilateral adrenal glands, right adnexa, and subcutaneous tissues. No  new or progressive metastatic disease identified. 2. Stable uterine fibroids and probable mild left hydrosalpinx.   Impression and Plan:   48 year old woman with:  1.  Stage IV melanoma of unknown primary with diffuse metastasis including CNS involvement.  She is currently on Pembrolizumab without any major complications.  CT scan obtained on 12/07/2018 was personally reviewed and discussed with the patient.  Continues to have overall stable disease without any large volume of metastasis.  Risks and benefits of continuing this therapy versus discontinuation of  this treatment were discussed.  Another option is to add ipilimumab to achieve a more sustainable response.  After discussion today, we have elected to continue with the same dose and schedule of single agent Pembrolizumab.  We will consider adding ipilimumab if he develops progression of disease.  She is agreeable to continue at this time.  2.  CNS metastasis: No evidence of relapse noted based on MRI of the brain on Nov 09, 2018.  She remains clinically stable.  3.  Prognosis: Her disease is incurable and any therapy is palliative.  Aggressive measures are warranted given her excellent performance status.  4.  Psychosocial consideration: She continues to have adequate support at this time with all her needs are met.  5.  Immune mediated complications: Complication associated with immunotherapy were reiterated.  These include colitis, pneumonitis others.  Thyroid disease, skin changes as well as CNS complications were also discussed.  6.  Anemia: Hemoglobin is normal at this time without any iron supplements.  7.  Thrombocytopenia: Mostly related to autoimmune etiology.  No active bleeding noted at this time.    8.  Follow-up: In 3 weeks for the next cycle of therapy.  25  minutes was spent with the patient face-to-face today.  More than 50% of time was dedicated to reviewing her disease status, treatment options, complications related therapy and answering questions regarding future plan of care.   Zola Button, MD 6/8/20208:55 AM

## 2018-12-12 ENCOUNTER — Telehealth: Payer: Self-pay | Admitting: Oncology

## 2018-12-12 ENCOUNTER — Telehealth: Payer: Self-pay | Admitting: Nurse Practitioner

## 2018-12-12 NOTE — Telephone Encounter (Signed)
I contacted patient to set up transportation services for appointments on 06/29 and 7/20.

## 2018-12-12 NOTE — Telephone Encounter (Signed)
Left a message regarding schedule will mail

## 2018-12-15 ENCOUNTER — Telehealth: Payer: Self-pay

## 2018-12-15 NOTE — Telephone Encounter (Signed)
Called patient and made her aware to expect a call from scheduling to get an appointment scheduled in Symptom Management next week. Patient instructed if symptoms get worse over the weekend she should go to Urgent Care or the ED. Verbalized understanding.

## 2018-12-15 NOTE — Telephone Encounter (Signed)
-----   Message from Wyatt Portela, MD sent at 12/15/2018  3:26 PM EDT ----- Symptom management next week please. Thanks.  ----- Message ----- From: Tami Lin, RN Sent: 12/15/2018   2:07 PM EDT To: Wyatt Portela, MD  Ms. Pincock is complaining of 8/10 pain at the top of her right ear. She states the pain is not constant but "when it hurts it really hurts." She said the pain is sharp and started 2 days ago off and on. She said the cartilage feels normal. I asked her if she had a PCP that she could go see but she said she wants to come here in case the pain is being caused by brain mets. Do you want me to get her an appointment in symptom management next week? Her next visit with you is 01/01/19. Lanelle Bal

## 2018-12-18 ENCOUNTER — Telehealth: Payer: Self-pay | Admitting: *Deleted

## 2018-12-18 ENCOUNTER — Telehealth: Payer: Self-pay | Admitting: Oncology

## 2018-12-18 ENCOUNTER — Telehealth: Payer: Self-pay | Admitting: Radiation Oncology

## 2018-12-18 NOTE — Telephone Encounter (Signed)
"  Do you work with radiation or [facility]?  This is Kaitlyn Duncan [phone]).   A nurse called me last week.  Do they have a Manuela Schwartz there?  No need to try a transfer to anyone else.  I'd like to talk to [place].  I can leave a message."

## 2018-12-18 NOTE — Telephone Encounter (Addendum)
Received call from patient requesting Marliss Coots, NP be removed from her care team and no further records be copied to her. This RN will remove NP [name] from care team as patient requested. This RN found that NP [name] had already been removed from the patient's care team.

## 2018-12-18 NOTE — Telephone Encounter (Signed)
Scheduled appt per 6/12 sch message - unable to reach pt left message with appt date and time

## 2018-12-19 ENCOUNTER — Encounter: Payer: Self-pay | Admitting: Adult Health

## 2018-12-19 ENCOUNTER — Other Ambulatory Visit: Payer: Self-pay

## 2018-12-19 ENCOUNTER — Inpatient Hospital Stay (HOSPITAL_BASED_OUTPATIENT_CLINIC_OR_DEPARTMENT_OTHER): Payer: Medicaid Other | Admitting: Adult Health

## 2018-12-19 VITALS — BP 113/81 | HR 95 | Temp 98.5°F | Resp 18 | Ht 61.0 in | Wt 139.9 lb

## 2018-12-19 DIAGNOSIS — C7931 Secondary malignant neoplasm of brain: Secondary | ICD-10-CM

## 2018-12-19 DIAGNOSIS — Z833 Family history of diabetes mellitus: Secondary | ICD-10-CM

## 2018-12-19 DIAGNOSIS — Z803 Family history of malignant neoplasm of breast: Secondary | ICD-10-CM

## 2018-12-19 DIAGNOSIS — Z8249 Family history of ischemic heart disease and other diseases of the circulatory system: Secondary | ICD-10-CM

## 2018-12-19 DIAGNOSIS — C7800 Secondary malignant neoplasm of unspecified lung: Secondary | ICD-10-CM

## 2018-12-19 DIAGNOSIS — C439 Malignant melanoma of skin, unspecified: Secondary | ICD-10-CM | POA: Diagnosis not present

## 2018-12-19 DIAGNOSIS — R591 Generalized enlarged lymph nodes: Secondary | ICD-10-CM

## 2018-12-19 DIAGNOSIS — D649 Anemia, unspecified: Secondary | ICD-10-CM

## 2018-12-19 DIAGNOSIS — D696 Thrombocytopenia, unspecified: Secondary | ICD-10-CM

## 2018-12-19 DIAGNOSIS — D259 Leiomyoma of uterus, unspecified: Secondary | ICD-10-CM

## 2018-12-19 DIAGNOSIS — K5909 Other constipation: Secondary | ICD-10-CM | POA: Diagnosis not present

## 2018-12-19 DIAGNOSIS — E119 Type 2 diabetes mellitus without complications: Secondary | ICD-10-CM

## 2018-12-19 DIAGNOSIS — H9201 Otalgia, right ear: Secondary | ICD-10-CM

## 2018-12-19 DIAGNOSIS — Z5112 Encounter for antineoplastic immunotherapy: Secondary | ICD-10-CM | POA: Diagnosis not present

## 2018-12-19 NOTE — Progress Notes (Signed)
[facility] Follow up:    Kaitlyn Hawks, NP [address] [place] [address]   DIAGNOSIS: Cancer Staging No matching staging information was found for the patient.  SUMMARY OF ONCOLOGIC HISTORY: Oncology History  Melanoma of skin ([place])  05/29/2018 Initial Diagnosis   Melanoma of skin ([place])   06/05/2018 -  Chemotherapy   The patient had pembrolizumab (KEYTRUDA) 200 mg in sodium chloride 0.9 % 50 mL chemo infusion, 200 mg, Intravenous, Once, 10 of 12 cycles Administration: 200 mg (06/05/2018), 200 mg (06/26/2018), 200 mg (07/17/2018), 200 mg (08/07/2018), 200 mg (08/28/2018), 200 mg (09/18/2018), 200 mg (10/09/2018), 200 mg (10/30/2018), 200 mg (11/20/2018), 200 mg (12/11/2018)  for chemotherapy treatment.      CURRENT THERAPY: Keytruda  INTERVAL HISTORY: Kaitlyn Duncan 48 y.o. female returns for evaluation due to concern of some ear pain.  This occurred on Thursday and Friday.  When it initially started hurting she describes it as a 9/10 intense pain.  She notes that it was located in her inner upper right ear cartilage.  She says that she has h/o sinus allergies and is unsure if that may affect her ear or not.  The pain she notes went away on its own, and then returned about three times on Friday, each time lasting a few minutes, all of these times occurred within one hour.  She notes that afterward she felt like perhaps the pain radiated to behind her ear as well.   Kaitlyn Duncan denies any new vision issues, headaches, nausea, vomiting, numbness, taste changes, focal weakness.     Patient Active Problem List   Diagnosis Date Noted  . Melanoma of skin ([place]) 05/29/2018  . Goals of care, counseling/discussion 05/29/2018  . Brain metastasis ([place]) 01/26/2018    has No Known Allergies.  MEDICAL HISTORY: Past Medical History:  Diagnosis Date  . Anemia   . Anemia   . Diabetes mellitus without complication (HCC)    prediabetic  . Seasonal allergies      SURGICAL HISTORY: Past Surgical History:  Procedure Laterality Date  . BIOPSY  01/28/2018   Procedure: BIOPSY;  Surgeon: Laurence Spates, MD;  Location: [facility];  Service: Endoscopy;;  . ESOPHAGOGASTRODUODENOSCOPY N/A 01/28/2018   Procedure: ESOPHAGOGASTRODUODENOSCOPY (EGD);  Surgeon: Laurence Spates, MD;  Location: [facility];  Service: Endoscopy;  Laterality: N/A;  . MOUTH SURGERY    . OVARY SURGERY     removed "something"    SOCIAL HISTORY: Social History   Socioeconomic History  . Marital status: Single    Spouse name: Not on file  . Number of children: Not on file  . Years of education: Not on file  . Highest education level: Not on file  Occupational History  . Not on file  Social Needs  . Financial resource strain: Not on file  . Food insecurity    Worry: Not on file    Inability: Not on file  . Transportation needs    Medical: Not on file    Non-medical: Not on file  Tobacco Use  . Smoking status: Never Smoker  . Smokeless tobacco: Never Used  Substance and Sexual Activity  . Alcohol use: No  . Drug use: No  . Sexual activity: Not Currently  Lifestyle  . Physical activity    Days per week: Not on file    Minutes per session: Not on file  . Stress: Not on file  Relationships  . Social connections    Talks  on phone: Not on file    Gets together: Not on file    Attends religious service: Not on file    Active member of club or organization: Not on file    Attends meetings of clubs or organizations: Not on file    Relationship status: Not on file  . Intimate partner violence    Fear of current or ex partner: No    Emotionally abused: No    Physically abused: No    Forced sexual activity: No  Other Topics Concern  . Not on file  Social History Narrative  . Not on file    FAMILY HISTORY: Family History  Problem Relation Age of Onset  . Hypertension Father   . Breast cancer Maternal Aunt   . Diabetes Maternal Aunt   . Breast cancer  Paternal Aunt   . Breast cancer Maternal Grandmother   . Breast cancer Paternal Grandmother   . Diabetes Paternal Grandmother     Review of Systems  Constitutional: Negative for appetite change, chills, fever and unexpected weight change.  HENT:   Negative for hearing loss, lump/mass, sore throat and trouble swallowing.   Eyes: Negative for eye problems.       Notes her vision is worse, she notes she is using her reading glasses more often over the past month.    Respiratory: Negative for chest tightness, cough and shortness of breath.   Cardiovascular: Negative for chest pain, leg swelling and palpitations.  Gastrointestinal: Positive for constipation (worse over the past couple of months, would like to see nutrition about this). Negative for abdominal distention, abdominal pain, diarrhea, nausea and vomiting.  Endocrine: Negative for hot flashes.  Musculoskeletal: Negative for gait problem.  Skin: Negative for itching and rash.  Neurological: Negative for dizziness, extremity weakness, gait problem, headaches, light-headedness, numbness, seizures and speech difficulty.  Hematological: Negative for adenopathy. Does not bruise/bleed easily.  Psychiatric/Behavioral: Negative for depression. The patient is not nervous/anxious.       PHYSICAL EXAMINATION  ECOG PERFORMANCE STATUS: 1 - Symptomatic but completely ambulatory  Vitals:   12/19/18 1314  BP: 113/81  Pulse: 95  Resp: 18  Temp: 98.5 F (36.9 C)  SpO2: 95%    Physical Exam Constitutional:      General: She is not in acute distress.    Appearance: Normal appearance. She is not toxic-appearing.  HENT:     Head: Normocephalic and atraumatic.     Right Ear: External ear normal. There is impacted cerumen (not impacted, but cannot view TM due to cerumen).     Left Ear: External ear normal. There is impacted cerumen (not impacted, but cannot view TM due to cerumen)).     Nose: Nose normal. No congestion or rhinorrhea.      Mouth/Throat:     Mouth: Mucous membranes are moist.     Pharynx: Oropharynx is clear. No oropharyngeal exudate or posterior oropharyngeal erythema.  Eyes:     General: No scleral icterus.    Extraocular Movements: Extraocular movements intact.     Pupils: Pupils are equal, round, and reactive to light.  Neck:     Musculoskeletal: Neck supple. No muscular tenderness.  Cardiovascular:     Rate and Rhythm: Normal rate and regular rhythm.     Pulses: Normal pulses.     Heart sounds: Normal heart sounds.  Pulmonary:     Effort: Pulmonary effort is normal.     Breath sounds: Normal breath sounds.  Abdominal:  General: Abdomen is flat. There is no distension.     Palpations: Abdomen is soft.     Tenderness: There is no abdominal tenderness.  Musculoskeletal:        General: No swelling.  Lymphadenopathy:     Cervical: No cervical adenopathy.  Skin:    General: Skin is warm and dry.     Capillary Refill: Capillary refill takes less than 2 seconds.     Findings: No rash.  Neurological:     General: No focal deficit present.     Mental Status: She is alert and oriented to person, place, and time.     Cranial Nerves: No cranial nerve deficit.     Sensory: No sensory deficit.     Motor: No weakness.     Coordination: Coordination normal.     Gait: Gait normal.  Psychiatric:        Mood and Affect: Mood normal.        Behavior: Behavior normal.     LABORATORY DATA:  CBC    Component Value Date/Time   WBC 4.4 12/11/2018 0846   WBC 12.1 (H) 01/29/2018 0350   RBC 4.71 12/11/2018 0846   HGB 12.2 12/11/2018 0846   HCT 38.6 12/11/2018 0846   PLT 76 (L) 12/11/2018 0846   MCV 82.0 12/11/2018 0846   MCH 25.9 (L) 12/11/2018 0846   MCHC 31.6 12/11/2018 0846   RDW 14.5 12/11/2018 0846   LYMPHSABS 1.1 12/11/2018 0846   MONOABS 0.6 12/11/2018 0846   EOSABS 0.0 12/11/2018 0846   BASOSABS 0.0 12/11/2018 0846    CMP     Component Value Date/Time   NA 139 12/11/2018 0846   K  3.7 12/11/2018 0846   CL 108 12/11/2018 0846   CO2 19 (L) 12/11/2018 0846   GLUCOSE 138 (H) 12/11/2018 0846   BUN 12 12/11/2018 0846   CREATININE 0.87 12/11/2018 0846   CALCIUM 8.9 12/11/2018 0846   PROT 6.9 12/11/2018 0846   ALBUMIN 4.0 12/11/2018 0846   AST 19 12/11/2018 0846   ALT 9 12/11/2018 0846   ALKPHOS 70 12/11/2018 0846   BILITOT 0.3 12/11/2018 0846   GFRNONAA >60 12/11/2018 0846   GFRAA >60 12/11/2018 0846    ASSESSMENT and PLAN:   Melanoma of skin ([place]) [name] is a pleasant 48 year old woman with metastatic melanoma on Keytruda.  She is here today for evaluation for right ear pain x 2 days noted last weekend that has since resolved.  She has history of brain mets and is fearful that this may be related.    [name]'s pain has resolved since she called [place] on Friday about it.  I reviewed with her the facts, which include that the pain was in her ear, it has resolved, and this makes it difficult to determine exactly the cause.  Her ear, skin, and surrounding are have no lesions, or tenderness.  She has no associated symptoms, or worsening of symptoms, to make me believe at this point it is progressive disease.  She does have sinus issues, and this could certainly be eustachian tube dysfunction causing a radiating pain.  I recommended that she use flonase daily.  I also cautioned her to be mindful of her mask and how it pulls on her ear cartilage, as that is in the same area as her pain.  She and I reviewed for her to call if the pain returns and becomes more persistent, or if she develops any associated symptoms.  [name] also has some constipation.  I recommended she take Senokot S as much as 2 tab BID, and Miralax when needed.  I also gave her a handout on high fiber foods.  I referred her to nutrition as requested.     [name] will return on 01/01/2019 for labs, f/u with Dr. Alen Blew, and her next treatment with [facility].     Orders Placed This Encounter  Procedures  . Ambulatory  referral to Nutrition and Diabetic E    Referral Priority:   Routine    Referral Type:   Consultation    Referral Reason:   Specialty Services Required    Number of Visits Requested:   1    All questions were answered. The patient knows to call the clinic with any problems, questions or concerns. We can certainly see the patient much sooner if necessary.  A total of (30) minutes of face-to-face time was spent with this patient with greater than 50% of that time in counseling and care-coordination.  This note was electronically signed. Scot Dock, NP 12/19/2018

## 2018-12-19 NOTE — Patient Instructions (Signed)
Take Flonase daily.  Continue taking your Zyrtec.  If your pain continues, worsens, or if you develop any of the symptoms that we discussed, please contact me.    I recommend that you take Senokot-S.  You can start at 1 tablet daily, and increase to as much as 2 tablets twice a day for constipation.  You can also take Miralax daily.    I placed a referral for you to see one of our dieticians.  Please make sure you are drinking plenty of water.     Fiber Content in Foods  See the following list for the dietary fiber content of some common foods. High-fiber foods High-fiber foods contain 4 grams or more (4g or more) of fiber per serving. They include:  Artichoke (fresh) - 1 medium has 10.3g of fiber.  Baked beans, plain or vegetarian (canned) -  cup has 5.2g of fiber.  Blackberries or raspberries (fresh) -  cup has 4g of fiber.  Bran cereal -  cup has 8.6g of fiber.  Bulgur (cooked) -  cup has 4g of fiber.  Kidney beans (canned) -  cup has 6.8g of fiber.  Lentils (cooked) -  cup has 7.8g of fiber.  Pear (fresh) - 1 medium has 5.1g of fiber.  Peas (frozen) -  cup has 4.4g of fiber.  Pinto beans (canned) -  cup has 5.5g of fiber.  Pinto beans (dried and cooked) -  cup has 7.7g of fiber.  Potato with skin (baked) - 1 medium has 4.4g of fiber.  Quinoa (cooked) -  cup has 5g of fiber.  Soybeans (canned, frozen, or fresh) -  cup has 5.1g of fiber. Moderate-fiber foods Moderate-fiber foods contain 1-4 grams (1-4g) of fiber per serving. They include:  Almonds - 1 oz. has 3.5g of fiber.  Apple with skin - 1 medium has 3.3g of fiber.  Applesauce, sweetened -  cup has 1.5g of fiber.  Bagel, plain - one 4-inch (10-cm) bagel has 2g of fiber.  Banana - 1 medium has 3.1g of fiber.  Broccoli (cooked) -  cup has 2.5g of fiber.  Carrots (cooked) -  cup has 2.3g of fiber.  Corn (canned or frozen) -  cup has 2.1g of fiber.  Corn tortilla - one 6-inch (15-cm)  tortilla has 1.5g of fiber.  Green beans (canned) -  cup has 2g of fiber.  Instant oatmeal -  cup has about 2g of fiber.  Long-grain brown rice (cooked) - 1 cup has 3.5g of fiber.  Macaroni, enriched (cooked) - 1 cup has 2.5g of fiber.  Melon - 1 cup has 1.4g of fiber.  Multigrain cereal -  cup has about 2-4g of fiber.  Orange - 1 small has 3.1g of fiber.  Potatoes, mashed -  cup has 1.6g of fiber.  Raisins - 1/4 cup has 1.6g of fiber.  Squash -  cup has 2.9g of fiber.  Sunflower seeds -  cup has 1.1g of fiber.  Tomato - 1 medium has 1.5g of fiber.  Vegetable or soy patty - 1 has 3.4g of fiber.  Whole-wheat bread - 1 slice has 2g of fiber.  Whole-wheat spaghetti -  cup has 3.2g of fiber. Low-fiber foods Low-fiber foods contain less than 1 gram (less than 1g) of fiber per serving. They include:  Egg - 1 large.  Flour tortilla - one 6-inch (15-cm) tortilla.  Fruit juice -  cup.  Lettuce - 1 cup.  Meat, poultry, or fish - 1 oz.  Milk -  1 cup.  Spinach (raw) - 1 cup.  White bread - 1 slice.  White rice -  cup.  Yogurt -  cup. Actual amounts of fiber in foods may be different depending on processing. Talk with your dietitian about how much fiber you need in your diet. This information is not intended to replace advice given to you by your health care provider. Make sure you discuss any questions you have with your health care provider. Document Released: 11/07/2006 Document Revised: 11/27/2015 Document Reviewed: 08/14/2015 Elsevier Interactive Patient Education  2019 [employer].

## 2018-12-19 NOTE — Assessment & Plan Note (Signed)
Kaitlyn Duncan is a pleasant 48 year old woman with metastatic melanoma on Keytruda.  She is here today for evaluation for right ear pain x 2 days noted last weekend that has since resolved.  She has history of brain mets and is fearful that this may be related.    [name]'s pain has resolved since she called [place] on Friday about it.  I reviewed with her the facts, which include that the pain was in her ear, it has resolved, and this makes it difficult to determine exactly the cause.  Her ear, skin, and surrounding are have no lesions, or tenderness.  She has no associated symptoms, or worsening of symptoms, to make me believe at this point it is progressive disease.  She does have sinus issues, and this could certainly be eustachian tube dysfunction causing a radiating pain.  I recommended that she use flonase daily.  I also cautioned her to be mindful of her mask and how it pulls on her ear cartilage, as that is in the same area as her pain.  She and I reviewed for her to call if the pain returns and becomes more persistent, or if she develops any associated symptoms.    [name] also has some constipation.  I recommended she take Senokot S as much as 2 tab BID, and Miralax when needed.  I also gave her a handout on high fiber foods.  I referred her to nutrition as requested.     [name] will return on 01/01/2019 for labs, f/u with Dr. Alen Blew, and her next treatment with [facility].

## 2018-12-21 ENCOUNTER — Telehealth: Payer: Self-pay | Admitting: Nutrition

## 2018-12-21 MED FILL — SOLIFENACIN SUCCINATE 5 MG: 5 | 30 days supply | Qty: 30 | Fill #1

## 2018-12-21 NOTE — Telephone Encounter (Signed)
I called and spoke to Ms. Cordone to schedule a nutrition appt. She's been scheduled for a telephone visit w/Barbara Neff on 6/22 at 145pm. I offered a morning appt, but pt declined.

## 2018-12-22 ENCOUNTER — Telehealth: Payer: Self-pay | Admitting: Nutrition

## 2018-12-22 NOTE — Telephone Encounter (Signed)
Left message for patient to verify telephone visit for pre reg °

## 2018-12-23 MED FILL — CIMETIDINE 400 MG TABLET: 400 | 30 days supply | Qty: 60 | Fill #1

## 2018-12-25 ENCOUNTER — Inpatient Hospital Stay: Payer: Medicaid Other | Admitting: Nutrition

## 2018-12-25 NOTE — Progress Notes (Signed)
RD working remotely.  48 year old female diagnosed with metastatic melanoma receiving [place].  PMH includes Anemia, PreDiabetes, Seasonal allergies.  Medications include MVI.  Labs include Glucose 138.  Height: 61 inches Weight: 139 pounds. BMI: 26.43.  Patient reports constipation. She has handouts on high fiber foods. Reports taking senekot which has helped.  Nutrition Diagnosis: Food and Nutrition Related Knowledge Deficit related to metastatic melanoma as evidenced by no prior need for nutrition information.  Intervention: Educated on incorporating high fiber foods throughout the day slowly over time. Increase water intake. Increase activity as tolerated. Questions answered.  Monitoring, Evaluation, Goals: Tolerate adequate fiber to improve constipation.  Nutrition Diagnosis resolved. No follow up required.

## 2018-12-26 ENCOUNTER — Other Ambulatory Visit: Payer: Self-pay | Admitting: Nurse Practitioner

## 2018-12-26 DIAGNOSIS — Z1231 Encounter for screening mammogram for malignant neoplasm of breast: Secondary | ICD-10-CM

## 2019-01-01 ENCOUNTER — Other Ambulatory Visit: Payer: Self-pay

## 2019-01-01 ENCOUNTER — Inpatient Hospital Stay: Payer: Medicaid Other

## 2019-01-01 ENCOUNTER — Inpatient Hospital Stay (HOSPITAL_BASED_OUTPATIENT_CLINIC_OR_DEPARTMENT_OTHER): Payer: Medicaid Other | Admitting: Oncology

## 2019-01-01 VITALS — BP 124/77 | HR 83 | Temp 99.1°F | Resp 18 | Ht 61.0 in | Wt 139.0 lb

## 2019-01-01 DIAGNOSIS — R591 Generalized enlarged lymph nodes: Secondary | ICD-10-CM

## 2019-01-01 DIAGNOSIS — Z833 Family history of diabetes mellitus: Secondary | ICD-10-CM

## 2019-01-01 DIAGNOSIS — C7800 Secondary malignant neoplasm of unspecified lung: Secondary | ICD-10-CM | POA: Diagnosis not present

## 2019-01-01 DIAGNOSIS — E079 Disorder of thyroid, unspecified: Secondary | ICD-10-CM

## 2019-01-01 DIAGNOSIS — K5900 Constipation, unspecified: Secondary | ICD-10-CM

## 2019-01-01 DIAGNOSIS — Z803 Family history of malignant neoplasm of breast: Secondary | ICD-10-CM

## 2019-01-01 DIAGNOSIS — D696 Thrombocytopenia, unspecified: Secondary | ICD-10-CM

## 2019-01-01 DIAGNOSIS — D259 Leiomyoma of uterus, unspecified: Secondary | ICD-10-CM

## 2019-01-01 DIAGNOSIS — Z8249 Family history of ischemic heart disease and other diseases of the circulatory system: Secondary | ICD-10-CM

## 2019-01-01 DIAGNOSIS — C786 Secondary malignant neoplasm of retroperitoneum and peritoneum: Secondary | ICD-10-CM

## 2019-01-01 DIAGNOSIS — J189 Pneumonia, unspecified organism: Secondary | ICD-10-CM

## 2019-01-01 DIAGNOSIS — C439 Malignant melanoma of skin, unspecified: Secondary | ICD-10-CM

## 2019-01-01 DIAGNOSIS — K529 Noninfective gastroenteritis and colitis, unspecified: Secondary | ICD-10-CM

## 2019-01-01 DIAGNOSIS — D649 Anemia, unspecified: Secondary | ICD-10-CM

## 2019-01-01 DIAGNOSIS — H9201 Otalgia, right ear: Secondary | ICD-10-CM

## 2019-01-01 DIAGNOSIS — C7931 Secondary malignant neoplasm of brain: Secondary | ICD-10-CM

## 2019-01-01 DIAGNOSIS — E119 Type 2 diabetes mellitus without complications: Secondary | ICD-10-CM

## 2019-01-01 DIAGNOSIS — R5383 Other fatigue: Secondary | ICD-10-CM

## 2019-01-01 DIAGNOSIS — Z5112 Encounter for antineoplastic immunotherapy: Secondary | ICD-10-CM | POA: Diagnosis not present

## 2019-01-01 LAB — CBC WITH DIFFERENTIAL (CANCER CENTER ONLY)
Abs Immature Granulocytes: 0.01 10*3/uL (ref 0.00–0.07)
Basophils Absolute: 0 10*3/uL (ref 0.0–0.1)
Basophils Relative: 0 %
Eosinophils Absolute: 0 10*3/uL (ref 0.0–0.5)
Eosinophils Relative: 0 %
HCT: 38.9 % (ref 36.0–46.0)
Hemoglobin: 12.2 g/dL (ref 12.0–15.0)
Immature Granulocytes: 0 %
Lymphocytes Relative: 24 %
Lymphs Abs: 1.2 10*3/uL (ref 0.7–4.0)
MCH: 25.5 pg — ABNORMAL LOW (ref 26.0–34.0)
MCHC: 31.4 g/dL (ref 30.0–36.0)
MCV: 81.2 fL (ref 80.0–100.0)
Monocytes Absolute: 0.5 10*3/uL (ref 0.1–1.0)
Monocytes Relative: 10 %
Neutro Abs: 3.2 10*3/uL (ref 1.7–7.7)
Neutrophils Relative %: 66 %
Platelet Count: 81 10*3/uL — ABNORMAL LOW (ref 150–400)
RBC: 4.79 MIL/uL (ref 3.87–5.11)
RDW: 13.1 % (ref 11.5–15.5)
WBC Count: 4.9 10*3/uL (ref 4.0–10.5)
nRBC: 0 % (ref 0.0–0.2)

## 2019-01-01 LAB — CMP (CANCER CENTER ONLY)
ALT: 9 U/L (ref 0–44)
AST: 17 U/L (ref 15–41)
Albumin: 4 g/dL (ref 3.5–5.0)
Alkaline Phosphatase: 76 U/L (ref 38–126)
Anion gap: 11 (ref 5–15)
BUN: 16 mg/dL (ref 6–20)
CO2: 19 mmol/L — ABNORMAL LOW (ref 22–32)
Calcium: 8.8 mg/dL — ABNORMAL LOW (ref 8.9–10.3)
Chloride: 108 mmol/L (ref 98–111)
Creatinine: 0.83 mg/dL (ref 0.44–1.00)
GFR, Est AFR Am: >60 mL/min (ref >60)
GFR, Estimated: >60 mL/min (ref >60)
Glucose, Bld: 138 mg/dL — ABNORMAL HIGH (ref 70–99)
Potassium: 3.7 mmol/L (ref 3.5–5.1)
Sodium: 138 mmol/L (ref 135–145)
Total Bilirubin: 0.4 mg/dL (ref 0.3–1.2)
Total Protein: 7.2 g/dL (ref 6.5–8.1)

## 2019-01-01 MED ORDER — SODIUM CHLORIDE 0.9 % IV SOLN
Freq: Once | INTRAVENOUS | Status: AC
  Administered 2019-01-01: 10:00:00 via INTRAVENOUS
  Filled 2019-01-01: qty 250

## 2019-01-01 MED ORDER — SODIUM CHLORIDE 0.9 % IV SOLN
200.0000 mg | Freq: Once | INTRAVENOUS | Status: AC
  Administered 2019-01-01: 200 mg via INTRAVENOUS
  Filled 2019-01-01: qty 8

## 2019-01-01 NOTE — Patient Instructions (Signed)
[facility] Discharge Instructions for Patients Receiving Chemotherapy  Today you received the following chemotherapy agent: Pembrolizumab (KEYTRUDA).  To help prevent nausea and vomiting after your treatment, we encourage you to take your nausea medication as prescribed.   If you develop nausea and vomiting that is not controlled by your nausea medication, call the clinic.   BELOW ARE SYMPTOMS THAT SHOULD BE REPORTED IMMEDIATELY:  *FEVER GREATER THAN 100.5 F  *CHILLS WITH OR WITHOUT FEVER  NAUSEA AND VOMITING THAT IS NOT CONTROLLED WITH YOUR NAUSEA MEDICATION  *UNUSUAL SHORTNESS OF BREATH  *UNUSUAL BRUISING OR BLEEDING  TENDERNESS IN MOUTH AND THROAT WITH OR WITHOUT PRESENCE OF ULCERS  *URINARY PROBLEMS  *BOWEL PROBLEMS  UNUSUAL RASH Items with * indicate a potential emergency and should be followed up as soon as possible.  Feel free to call the clinic should you have any questions or concerns. The clinic phone number is [phone].  Please show the CHEMO ALERT CARD at check-in to the Emergency Department and triage nurse.   

## 2019-01-01 NOTE — Progress Notes (Signed)
Per MD Shadad's progress note today (01/01/2019) ok to continue with tx despite platelet counts.

## 2019-01-01 NOTE — Progress Notes (Signed)
Hematology and Oncology Follow Up Visit  Kaitlyn Duncan [id number] Nov 10, 1970 48 y.o. 01/01/2019 9:29 AM Kaitlyn Duncan, NPStrup, Kaitlyn Bears, NP   Principle Diagnosis: 48 year old woman with stage IV melanoma with disease involvement including brain, peritoneum and lymphadenopathy in July 2019.  Her tumor arising of unknown primary that is BRAF positive.   Prior Therapy: He is status post subcutaneous nodule biopsy on 01/27/2018 which confirmed the presence of metastatic melanoma. She status post endoscopy on 01/28/2018 which showed subcutaneous involvement that consistent with metastatic melanoma.  Whole brain radiation for total of 30 Gy in 10 fractions started on 02/07/2018.   Mektovi 45 mg bid, [name] 450 mg daily started in August 2019.  Therapy discontinued in November 2019 after partial response.  Current therapy: Pembrolizumab 200 mg every 3 weeks started on 06/05/2018.  She is here for cycle 11.    Interim History: Kaitlyn Duncan returns today for a repeat evaluation.  Since the last visit, she continues to tolerate pembrolizumab without any major complications.  He is reporting some more fatigue and tiredness but no dermatological toxicities.  She denies any diarrhea or respiratory complaints.  Her appetite is reasonable and does not report any major issues.  Patient denied headaches, blurry vision, syncope or seizures.  Denies any fevers, chills or sweats.  Denied chest pain, palpitation, orthopnea or leg edema.  Denied cough, wheezing or hemoptysis.  Denied nausea, vomiting or abdominal pain.  Denies any constipation or diarrhea.  Denies any frequency urgency or hesitancy.  Denies any arthralgias or myalgias.  Denies any skin rashes or lesions.  Denies any bleeding or clotting tendency.  Denies any easy bruising.  Denies any hair or nail changes.  Denies any anxiety or depression.  Remaining review of system is negative.                    Medications: I have  reviewed the patient's current medications.  Current Outpatient Medications  Medication Sig Dispense Refill  . acetaminophen (TYLENOL) 325 MG tablet Take 650 mg by mouth every 6 (six) hours as needed.    . cetirizine (ZYRTEC) 10 MG tablet Take 10 mg by mouth as needed for allergies (for seasonal allergies).    . cimetidine (TAGAMET) 400 MG tablet Take 1 tablet (400 mg total) by mouth 2 (two) times daily. 60 tablet 5  . ferrous sulfate 325 (65 FE) MG EC tablet Take 1 tablet (325 mg total) by mouth 2 (two) times daily. (Patient not taking: Reported on 10/30/2018) 90 tablet 3  . fluticasone (FLONASE) 50 MCG/ACT nasal spray Place 1 spray into both nostrils daily. 9.9 g 2  . Multiple Vitamin (MULTIVITAMIN WITH MINERALS) TABS tablet Take 1 tablet by mouth daily.    . mupirocin ointment (BACTROBAN) 2 % Place 1 application into the nose 2 (two) times daily. 30 g 1  . Sennosides (LAXATIVE PILLS) 15 MG TABS Take 15 mg by mouth daily.    . solifenacin (VESICARE) 5 MG tablet Take 1 tablet (5 mg total) by mouth daily. 30 tablet 5   No current facility-administered medications for this visit.      Allergies: No Known Allergies  Past Medical History, Surgical history, Social history, and Family History were reviewed and updated.  Review of Systems:    Physical Exam:   Blood pressure 124/77, pulse 83, temperature 99.1 F (37.3 C), temperature source Oral, resp. rate 18, height _0  (1.549 m), weight 139 lb (63 kg), SpO2 100 %.  ECOG: 1     General appearance: Comfortable appearing without any discomfort Head: Normocephalic without any trauma Oropharynx: Mucous membranes are moist and pink without any thrush or ulcers. Eyes: Pupils are equal and round reactive to light. Lymph nodes: No cervical, supraclavicular, inguinal or axillary lymphadenopathy.   Heart:regular rate and rhythm.  S1 and S2 without leg edema. Lung: Clear without any rhonchi or wheezes.  No dullness to  percussion. Abdomin: Soft, nontender, nondistended with good bowel sounds.  No hepatosplenomegaly. Musculoskeletal: No joint deformity or effusion.  Full range of motion noted. Neurological: No deficits noted on motor, sensory and deep tendon reflex exam. Skin: No petechial rash or dryness.  Appeared moist.                Lab Results: Lab Results  Component Value Date   WBC 4.9 01/01/2019   HGB 12.2 01/01/2019   HCT 38.9 01/01/2019   MCV 81.2 01/01/2019   PLT 81 (L) 01/01/2019     Chemistry      Component Value Date/Time   NA 139 12/11/2018 0846   K 3.7 12/11/2018 0846   CL 108 12/11/2018 0846   CO2 19 (L) 12/11/2018 0846   BUN 12 12/11/2018 0846   CREATININE 0.87 12/11/2018 0846      Component Value Date/Time   CALCIUM 8.9 12/11/2018 0846   ALKPHOS 70 12/11/2018 0846   AST 19 12/11/2018 0846   ALT 9 12/11/2018 0846   BILITOT 0.3 12/11/2018 7649        48 year old woman with:  1.  Melanoma of unknown primary presented with stage IV disease including diffuse metastasis including peritoneal involvement, lymphadenopathy and CNS disease.  She remains on Pembrolizumab without any major complaints at this time although she is developing more fatigue and tiredness.  Risks and benefits of continuing this therapy long-term was reviewed is agreeable to continue but would like a treatment break.  Plan is to proceed with therapy today and next cycle in 3 weeks.  A treatment break will be the next step and repeat imaging studies after that.  Different salvage therapy or restarting immunotherapy may be needed if he develops progression of disease.  2.  CNS metastasis: Continues to be relatively stable with imaging studies of the brain in May 2020 shows stable disease..  3.  Prognosis: Therapy remains palliative and her disease is incurable.  Aggressive measures are needed however given her excellent performance status.  4.  Psychosocial consideration: This continues to  be addressed with her and her needs are met at this time adequately.  5.  Immune mediated complications: Long-term complications occluding pneumonitis, colitis and thyroid disease will continue to be monitored.  Educated about these complications..  6.  Anemia: Hemoglobin is back to normal range without intervention needed  7.  Thrombocytopenia: Continues to be mild and asymptomatic.  We will proceed with therapy today.  No additional intervention is needed.  Platelet count 81,000 is adequate for treatment.  8.  Follow-up: will be in 3 weeks for repeat evaluation.  25  minutes was spent with the patient face-to-face today.  More than 50% of time was spent on reviewing disease status update, treatment options and answering questions regarding future plan of care.   Zola Button, MD 6/29/20209:29 AM

## 2019-01-02 ENCOUNTER — Telehealth: Payer: Self-pay | Admitting: Oncology

## 2019-01-02 NOTE — Telephone Encounter (Signed)
No 6/29 los  °

## 2019-01-22 ENCOUNTER — Inpatient Hospital Stay (HOSPITAL_BASED_OUTPATIENT_CLINIC_OR_DEPARTMENT_OTHER): Payer: Medicaid Other | Admitting: Oncology

## 2019-01-22 ENCOUNTER — Inpatient Hospital Stay: Payer: Medicaid Other

## 2019-01-22 ENCOUNTER — Other Ambulatory Visit: Payer: Self-pay

## 2019-01-22 ENCOUNTER — Inpatient Hospital Stay: Payer: Medicaid Other | Attending: Obstetrics and Gynecology

## 2019-01-22 VITALS — BP 130/78 | HR 75 | Temp 98.7°F | Resp 17 | Ht 61.0 in | Wt 138.7 lb

## 2019-01-22 DIAGNOSIS — Z5112 Encounter for antineoplastic immunotherapy: Secondary | ICD-10-CM | POA: Insufficient documentation

## 2019-01-22 DIAGNOSIS — D649 Anemia, unspecified: Secondary | ICD-10-CM

## 2019-01-22 DIAGNOSIS — R591 Generalized enlarged lymph nodes: Secondary | ICD-10-CM | POA: Insufficient documentation

## 2019-01-22 DIAGNOSIS — D696 Thrombocytopenia, unspecified: Secondary | ICD-10-CM | POA: Diagnosis not present

## 2019-01-22 DIAGNOSIS — C439 Malignant melanoma of skin, unspecified: Secondary | ICD-10-CM

## 2019-01-22 DIAGNOSIS — Z79899 Other long term (current) drug therapy: Secondary | ICD-10-CM

## 2019-01-22 DIAGNOSIS — C7931 Secondary malignant neoplasm of brain: Secondary | ICD-10-CM | POA: Diagnosis not present

## 2019-01-22 LAB — CMP (CANCER CENTER ONLY)
ALT: 9 U/L (ref 0–44)
AST: 18 U/L (ref 15–41)
Albumin: 4.2 g/dL (ref 3.5–5.0)
Alkaline Phosphatase: 85 U/L (ref 38–126)
Anion gap: 10 (ref 5–15)
BUN: 15 mg/dL (ref 6–20)
CO2: 23 mmol/L (ref 22–32)
Calcium: 9.2 mg/dL (ref 8.9–10.3)
Chloride: 106 mmol/L (ref 98–111)
Creatinine: 0.82 mg/dL (ref 0.44–1.00)
GFR, Est AFR Am: >60 mL/min (ref >60)
GFR, Estimated: >60 mL/min (ref >60)
Glucose, Bld: 98 mg/dL (ref 70–99)
Potassium: 3.9 mmol/L (ref 3.5–5.1)
Sodium: 139 mmol/L (ref 135–145)
Total Bilirubin: 0.4 mg/dL (ref 0.3–1.2)
Total Protein: 7.3 g/dL (ref 6.5–8.1)

## 2019-01-22 LAB — CBC WITH DIFFERENTIAL (CANCER CENTER ONLY)
Abs Immature Granulocytes: 0.01 10*3/uL (ref 0.00–0.07)
Basophils Absolute: 0 10*3/uL (ref 0.0–0.1)
Basophils Relative: 0 %
Eosinophils Absolute: 0 10*3/uL (ref 0.0–0.5)
Eosinophils Relative: 0 %
HCT: 38.3 % (ref 36.0–46.0)
Hemoglobin: 12.2 g/dL (ref 12.0–15.0)
Immature Granulocytes: 0 %
Lymphocytes Relative: 29 %
Lymphs Abs: 1.2 10*3/uL (ref 0.7–4.0)
MCH: 25.7 pg — ABNORMAL LOW (ref 26.0–34.0)
MCHC: 31.9 g/dL (ref 30.0–36.0)
MCV: 80.8 fL (ref 80.0–100.0)
Monocytes Absolute: 0.5 10*3/uL (ref 0.1–1.0)
Monocytes Relative: 12 %
Neutro Abs: 2.4 10*3/uL (ref 1.7–7.7)
Neutrophils Relative %: 59 %
Platelet Count: 92 10*3/uL — ABNORMAL LOW (ref 150–400)
RBC: 4.74 MIL/uL (ref 3.87–5.11)
RDW: 12.8 % (ref 11.5–15.5)
WBC Count: 4.2 10*3/uL (ref 4.0–10.5)
nRBC: 0 % (ref 0.0–0.2)

## 2019-01-22 MED ORDER — SODIUM CHLORIDE 0.9 % IV SOLN
200.0000 mg | Freq: Once | INTRAVENOUS | Status: AC
  Administered 2019-01-22: 200 mg via INTRAVENOUS
  Filled 2019-01-22: qty 8

## 2019-01-22 MED ORDER — SODIUM CHLORIDE 0.9 % IV SOLN
Freq: Once | INTRAVENOUS | Status: AC
  Administered 2019-01-22: 10:00:00 via INTRAVENOUS
  Filled 2019-01-22: qty 250

## 2019-01-22 MED FILL — SOLIFENACIN SUCCINATE 5 MG: 5 | 30 days supply | Qty: 30 | Fill #2

## 2019-01-22 NOTE — Progress Notes (Signed)
Per Dr. Alen Blew, ok to treat with platelets of 92.

## 2019-01-22 NOTE — Patient Instructions (Signed)
[facility] Discharge Instructions for Patients Receiving Chemotherapy  Today you received the following chemotherapy agent: Pembrolizumab (KEYTRUDA).  To help prevent nausea and vomiting after your treatment, we encourage you to take your nausea medication as prescribed.   If you develop nausea and vomiting that is not controlled by your nausea medication, call the clinic.   BELOW ARE SYMPTOMS THAT SHOULD BE REPORTED IMMEDIATELY:  *FEVER GREATER THAN 100.5 F  *CHILLS WITH OR WITHOUT FEVER  NAUSEA AND VOMITING THAT IS NOT CONTROLLED WITH YOUR NAUSEA MEDICATION  *UNUSUAL SHORTNESS OF BREATH  *UNUSUAL BRUISING OR BLEEDING  TENDERNESS IN MOUTH AND THROAT WITH OR WITHOUT PRESENCE OF ULCERS  *URINARY PROBLEMS  *BOWEL PROBLEMS  UNUSUAL RASH Items with * indicate a potential emergency and should be followed up as soon as possible.  Feel free to call the clinic should you have any questions or concerns. The clinic phone number is [phone].  Please show the CHEMO ALERT CARD at check-in to the Emergency Department and triage nurse.   

## 2019-01-22 NOTE — Progress Notes (Signed)
Hematology and Oncology Follow Up Visit  Kaitlyn Duncan [id number] 09-10-1970 48 y.o. 01/22/2019 9:13 AM Kaitlyn Duncan, Kaitlyn Duncan, [name], NP   Principle Diagnosis: 48 year old woman with melanoma and unknown primary diagnosed in July 2019.  She was found to have stage IV disease with brain involvement, peritoneum and lymphadenopathy.  Her tumor is BRAF positive.   Prior Therapy: He is status post subcutaneous nodule biopsy on 01/27/2018 which confirmed the presence of metastatic melanoma. She status post endoscopy on 01/28/2018 which showed subcutaneous involvement that consistent with metastatic melanoma.  Whole brain radiation for total of 30 Gy in 10 fractions started on 02/07/2018.   Mektovi 45 mg bid, [name] 450 mg daily started in August 2019.  Therapy discontinued in November 2019 after partial response.  Current therapy: Pembrolizumab 200 mg every 3 weeks started on 06/05/2018.  She is here for cycle 12.    Interim History: [name] is here for a follow-up.  Since the last visit, she reports no major changes in her health.  She continues to do tolerated Pembrolizumab without any new complaints.  She denies any diarrhea, abdominal pain or respiratory issues.  She denies any changes in her appetite or performance status.  She denies excess fatigue or tiredness.  She denies any skin rashes or lesions.  She denied any alteration mental status, neuropathy, confusion or dizziness.  Denies any headaches or lethargy.  Denies any night sweats, weight loss or changes in appetite.  Denied orthopnea, dyspnea on exertion or chest discomfort.  Denies shortness of breath, difficulty breathing hemoptysis or cough.  Denies any abdominal distention, nausea, early satiety or dyspepsia.  Denies any hematuria, frequency, dysuria or nocturia.  Denies any skin irritation, dryness or rash.  Denies any ecchymosis or petechiae.  Denies any lymphadenopathy or clotting.  Denies any heat or cold intolerance.   Denies any anxiety or depression.  Remaining review of system is negative.      .     Medications: Without any changes on review. Current Outpatient Medications  Medication Sig Dispense Refill  . acetaminophen (TYLENOL) 325 MG tablet Take 650 mg by mouth every 6 (six) hours as needed.    . cetirizine (ZYRTEC) 10 MG tablet Take 10 mg by mouth as needed for allergies (for seasonal allergies).    . cimetidine (TAGAMET) 400 MG tablet Take 1 tablet (400 mg total) by mouth 2 (two) times daily. 60 tablet 5  . ferrous sulfate 325 (65 FE) MG EC tablet Take 1 tablet (325 mg total) by mouth 2 (two) times daily. (Patient not taking: Reported on 10/30/2018) 90 tablet 3  . fluticasone (FLONASE) 50 MCG/ACT nasal spray Place 1 spray into both nostrils daily. 9.9 g 2  . Multiple Vitamin (MULTIVITAMIN WITH MINERALS) TABS tablet Take 1 tablet by mouth daily.    . mupirocin ointment (BACTROBAN) 2 % Place 1 application into the nose 2 (two) times daily. 30 g 1  . Sennosides (LAXATIVE PILLS) 15 MG TABS Take 15 mg by mouth daily.    . solifenacin (VESICARE) 5 MG tablet Take 1 tablet (5 mg total) by mouth daily. 30 tablet 5   No current facility-administered medications for this visit.      Allergies: No Known Allergies  Past Medical History, Surgical history, Social history, and Family History remain without any change in update today.  Review of Systems:    Physical Exam:   Blood pressure 130/78, pulse 75, temperature 98.7 F (37.1 C), temperature source Oral, resp. rate 17, height  $'5\' 1"'k$  (1.549 m), weight 138 lb 11.2 oz (62.9 kg), SpO2 100 %.       ECOG: 1    General appearance: Alert, awake without any distress. Head: Atraumatic without abnormalities Oropharynx: Without any thrush or ulcers. Eyes: No scleral icterus. Lymph nodes: No lymphadenopathy noted in the cervical, supraclavicular, or axillary nodes Heart:regular rate and rhythm, without any murmurs or gallops.   Lung: Clear  to auscultation without any rhonchi, wheezes or dullness to percussion. Abdomin: Soft, nontender without any shifting dullness or ascites. Musculoskeletal: No clubbing or cyanosis. Neurological: No motor or sensory deficits. Skin: No rashes or lesions. Psychiatric: Mood and affect appeared normal.                Lab Results: Lab Results  Component Value Date   WBC 4.2 01/22/2019   HGB 12.2 01/22/2019   HCT 38.3 01/22/2019   MCV 80.8 01/22/2019   PLT 92 (L) 01/22/2019     Chemistry      Component Value Date/Time   NA 138 01/01/2019 0848   K 3.7 01/01/2019 0848   CL 108 01/01/2019 0848   CO2 19 (L) 01/01/2019 0848   BUN 16 01/01/2019 0848   CREATININE 0.83 01/01/2019 0848      Component Value Date/Time   CALCIUM 8.8 (L) 01/01/2019 0848   ALKPHOS 76 01/01/2019 0848   AST 17 01/01/2019 0848   ALT 9 01/01/2019 0848   BILITOT 0.4 01/01/2019 5731        48 year old woman with:  1.  Stage IV melanoma with lymphadenopathy, peritoneal involvement as well as CNS metastasis diagnosed in July 2019.  She has tolerated Pembrolizumab without any major complications although she is requesting treatment break.  The natural course of this disease and different salvage therapy options were discussed.  We also discussed the role of spot radiation therapy for specific areas of concern such as axillary lymph nodes.  After discussion today, she is agreeable to proceed with Pembrolizumab and she will have a short treatment break and resume treatment after repeating staging studies and August 2020.  2.  CNS metastasis: No evidence of disease relapse at this time.  She will continue to have periodic MRI imaging.  3.  Prognosis: Her disease is incurable although aggressive measures are warranted given her excellent performance status.  4.  Psychosocial consideration: Her needs are not met regularly at this time.  No issues reported since last visit.  5.  Immune mediated  complications: I continue to educate and reiterate complications related to immune therapy which include pneumonitis, colitis, arthritis and thyroid disease.  6.  Anemia: Resolved at this time with hemoglobin back to normal range.  7.  Thrombocytopenia: Continues to be overall mild and asymptomatic.  Platelet count 92,000 and does not require any intervention.   8.  Follow-up: We will be in 4 to 6 weeks for repeat evaluation after imaging studies.  25  minutes was spent with the patient face-to-face today.  More than 50% of time was dedicated to discussing her disease status, treatment options and long-term complications related to current therapy.   Zola Button, MD 7/20/20209:13 AM

## 2019-01-23 ENCOUNTER — Telehealth: Payer: Self-pay | Admitting: Oncology

## 2019-01-23 NOTE — Telephone Encounter (Signed)
Called and left msg. Mailed printout  °

## 2019-01-26 MED FILL — CIMETIDINE 400 MG TABLET: 400 | 30 days supply | Qty: 60 | Fill #2

## 2019-02-05 ENCOUNTER — Other Ambulatory Visit: Payer: Self-pay | Admitting: Radiation Therapy

## 2019-02-07 ENCOUNTER — Ambulatory Visit
Admission: RE | Admit: 2019-02-07 | Discharge: 2019-02-07 | Disposition: A | Discharge status: Home / Self Care | Payer: Medicaid Other | Source: Ambulatory Visit | Attending: Nurse Practitioner | Admitting: Nurse Practitioner

## 2019-02-07 ENCOUNTER — Other Ambulatory Visit: Payer: Self-pay

## 2019-02-07 DIAGNOSIS — Z1231 Encounter for screening mammogram for malignant neoplasm of breast: Secondary | ICD-10-CM

## 2019-02-08 ENCOUNTER — Other Ambulatory Visit: Payer: Self-pay | Admitting: Nurse Practitioner

## 2019-02-08 DIAGNOSIS — R928 Other abnormal and inconclusive findings on diagnostic imaging of breast: Secondary | ICD-10-CM

## 2019-02-09 ENCOUNTER — Other Ambulatory Visit: Payer: Self-pay

## 2019-02-09 ENCOUNTER — Ambulatory Visit
Admission: RE | Admit: 2019-02-09 | Discharge: 2019-02-09 | Disposition: A | Discharge status: Home / Self Care | Payer: Medicaid Other | Source: Ambulatory Visit | Attending: Radiation Oncology | Admitting: Radiation Oncology

## 2019-02-09 DIAGNOSIS — C7931 Secondary malignant neoplasm of brain: Secondary | ICD-10-CM

## 2019-02-09 DIAGNOSIS — C7949 Secondary malignant neoplasm of other parts of nervous system: Secondary | ICD-10-CM

## 2019-02-09 MED ORDER — GADOBENATE DIMEGLUMINE 529 MG/ML IV SOLN
13.0000 mL | Freq: Once | INTRAVENOUS | Status: AC | PRN
  Administered 2019-02-09: 13 mL via INTRAVENOUS

## 2019-02-12 ENCOUNTER — Other Ambulatory Visit: Payer: Self-pay | Admitting: Nurse Practitioner

## 2019-02-12 ENCOUNTER — Inpatient Hospital Stay: Payer: Medicaid Other | Attending: Obstetrics and Gynecology

## 2019-02-12 ENCOUNTER — Other Ambulatory Visit: Payer: Self-pay

## 2019-02-12 DIAGNOSIS — C439 Malignant melanoma of skin, unspecified: Secondary | ICD-10-CM | POA: Insufficient documentation

## 2019-02-13 ENCOUNTER — Ambulatory Visit
Admission: RE | Admit: 2019-02-13 | Discharge: 2019-02-13 | Disposition: A | Discharge status: Home / Self Care | Payer: Medicaid Other | Source: Ambulatory Visit | Attending: Nurse Practitioner | Admitting: Nurse Practitioner

## 2019-02-13 ENCOUNTER — Other Ambulatory Visit: Payer: Self-pay

## 2019-02-13 ENCOUNTER — Other Ambulatory Visit: Payer: Self-pay | Admitting: Nurse Practitioner

## 2019-02-13 DIAGNOSIS — R928 Other abnormal and inconclusive findings on diagnostic imaging of breast: Secondary | ICD-10-CM

## 2019-02-13 DIAGNOSIS — N6489 Other specified disorders of breast: Secondary | ICD-10-CM

## 2019-02-14 ENCOUNTER — Encounter: Payer: Self-pay | Admitting: Urology

## 2019-02-14 ENCOUNTER — Other Ambulatory Visit: Payer: Self-pay | Admitting: Nurse Practitioner

## 2019-02-14 ENCOUNTER — Ambulatory Visit
Admission: RE | Admit: 2019-02-14 | Discharge: 2019-02-14 | Disposition: A | Discharge status: Home / Self Care | Payer: Medicaid Other | Source: Ambulatory Visit | Attending: Urology | Admitting: Urology

## 2019-02-14 ENCOUNTER — Telehealth: Payer: Self-pay | Admitting: Urology

## 2019-02-14 DIAGNOSIS — C7931 Secondary malignant neoplasm of brain: Secondary | ICD-10-CM

## 2019-02-14 NOTE — Telephone Encounter (Signed)
I left a message on her voicemail requesting that she return my call to review her recent MRI scan and conduct her routine 3 month follow up visit by phone.  Nicholos Johns, MMS, PA-C [place] [facility] [place] [place]: [phone]  Fax: [phone]

## 2019-02-14 NOTE — Progress Notes (Signed)
Radiation Oncology         [phone]) [phone] ________________________________  Name: Kaitlyn Duncan MRN: [id number]  Date: 02/14/2019  DOB: [birth year]  Post Treatment Note  CC: [name], NP  [name], [name], NP  Diagnosis:   48 yo woman with over 84 brain metastases fromstage IVmelanoma  Interval Since Last Radiation:  1 year 02/06/2018 to 02/17/2018:   The whole brain was treated to 30 Gy in 10 fractions of 3 Gy  Narrative:  I spoke with the patient to conduct her routine scheduled 3 follow up visit and to discuss results from her recent follow up MRI via telephone to spare the patient unnecessary potential exposure in the healthcare setting during the current COVID-19 pandemic.  The patient was notified in advance and gave permission to proceed with this visit format. Her scan from 02/09/19 shows a stable appearance of the numerous small metastatic deposits throughout the brain which are stable to improved and numerous lesions throughout the scalp which are also stable as compared to the previous study.  Her case was reviewed in the multidisciplinary brain conference on 02/12/19 with recommendation to continue with 3 month surveillance scans. She has continued on Pembrolizumab 200 mg every 3 weeks under the care and direction of Dr. Alen Blew- started 06/05/18 and reports that she continues to tolerate this well. She developed a rash on her upper extremities which she is managing with increased moisturizer which seems to be helping.  She has completed 12 cycles with plans to repeat systemic imaging on 03/02/19 for disease restaging prior to her follow up with Dr. Alen Blew.  On review of systems, the patient states that she is doing well overall.  She has not had any significant headaches, tinnitus, dizziness/imbalance, tremor or seizure activity.  She denies chest pain, shortness of breath, wheezing, cough or hemoptysis.  She was able to discontinue her Keppra, initially prescribed as  prophylactic, as per direction of Dr. Mickeal Skinner in 09/2018.  She denies any seizure activity since discontinuation. She reports improved appetite and is maintaining her weight.  She denies N/V, constipation or diarrhea.  She reports some improvement in the gas and indigestion since starting Tagamet which she continues to take BID.  She was initially treated with Mektovi 45 mg bid and [name] 450 mg daily systemic chemotherapy under the care and direction of Dr. Alen Blew- started on March 02, 2018 but this was discontinued in November 2019 after partial response.  She was started on Pembrolizumab 200 mg every 3 weeks- started 06/05/18 and she reports that she tolerates this well. She was last seen by Dr. Alen Blew on 01/22/19 and the plan is to continue in observation following her 12th cycle of treatment and repeat systemic imaging in 02/2019. She has noted significant improvement, in fact, almost resolution of the palpable scalp lesions which she is quite pleased with. She has mild itching and dryness of her scalp and has developed a mildly itchy rash on her arms, improved with moisturizer.  She has also noted a dry "patch" on the right side of her face, near her ear that has been present for the past 4-5 months and is not progressively enlarging and is non-itchy. Her PCP has recently switched her to [place] 5mg  po daily for management of her OAB and this is helping significantly reduce her nocturia and she is getting better rest.  She tolerates this medication well.  ALLERGIES:  has No Known Allergies.  Meds: Current Outpatient Medications  Medication Sig Dispense Refill  acetaminophen (TYLENOL) 325 MG tablet Take 650 mg by mouth every 6 (six) hours as needed.     cetirizine (ZYRTEC) 10 MG tablet Take 10 mg by mouth as needed for allergies (for seasonal allergies).     cimetidine (TAGAMET) 400 MG tablet Take 1 tablet (400 mg total) by mouth 2 (two) times daily. 60 tablet 5   fluticasone (FLONASE) 50 MCG/ACT  nasal spray Place 1 spray into both nostrils daily. 9.9 g 2   Multiple Vitamin (MULTIVITAMIN WITH MINERALS) TABS tablet Take 1 tablet by mouth daily.     Sennosides (LAXATIVE PILLS) 15 MG TABS Take 15 mg by mouth daily.     solifenacin (VESICARE) 5 MG tablet Take 1 tablet (5 mg total) by mouth daily. 30 tablet 5   ferrous sulfate 325 (65 FE) MG EC tablet Take 1 tablet (325 mg total) by mouth 2 (two) times daily. (Patient not taking: Reported on 10/30/2018) 90 tablet 3   mupirocin ointment (BACTROBAN) 2 % Place 1 application into the nose 2 (two) times daily. (Patient not taking: Reported on 02/14/2019) 30 g 1   No current facility-administered medications for this encounter.     Physical Findings:  vitals were not taken for this visit.  Unable to assess due to telehealth visit format.  Lab Findings: Lab Results  Component Value Date   WBC 4.2 01/22/2019   HGB 12.2 01/22/2019   HCT 38.3 01/22/2019   MCV 80.8 01/22/2019   PLT 92 (L) 01/22/2019    Radiographic Findings: Mr [name] CN Contrast  Result Date: 02/09/2019 CLINICAL DATA:  Metastatic melanoma status post whole brain radiation. EXAM: MRI HEAD WITHOUT AND WITH CONTRAST TECHNIQUE: Multiplanar, multiecho pulse sequences of the brain and surrounding structures were obtained without and with intravenous contrast. CONTRAST:  65mL MULTIHANCE GADOBENATE DIMEGLUMINE 529 MG/ML IV SOLN COMPARISON:  11/09/2018 FINDINGS: Brain: There is no evidence of acute infarct, intracranial mass effect, or extra-axial fluid collection. The ventricles and sulci are normal. Patchy and confluent T2 hyperintensity in the cerebral white matter bilaterally has mildly progressed and is consistent with the sequelae of previous radiation therapy. Numerous punctate lesions are again seen scattered throughout the brain demonstrating intrinsic T1 hyperintensity and susceptibility artifact. No significant interval changes identified, with the largest residual lesions  measuring 10 mm in the right parieto-occipital region and 7 mm in the medial right thalamus. No definite new lesion is identified, and there is no edema associated with any of the treated lesions. Vascular: Major intracranial vascular flow voids are preserved. Skull and upper cervical spine: Unremarkable bone marrow signal. Sinuses/Orbits: Unremarkable orbits. Chronically opacified and atelectatic left maxillary sinus. Unchanged small left mastoid effusion. Other: Similar appearance of small residual treated scalp lesions. IMPRESSION: 1. Unchanged punctate treated brain metastases. No evidence of new lesions. 2. Progressive post radiation changes in the cerebral white matter. Electronically Signed   By: Logan Bores M.D.   On: 02/09/2019 16:09   US Breast Ltd Uni Right Inc Axilla  Result Date: 02/13/2019 CLINICAL DATA:  The patient returns after screening study for evaluation of possible RIGHT breast asymmetry and possible LEFT axillary mass. Patient is being treated for metastatic melanoma. EXAM: DIGITAL DIAGNOSTIC BILATERAL MAMMOGRAM WITH CAD AND TOMO ULTRASOUND RIGHT BREAST ULTRASOUND LEFT AXILLA COMPARISON:  02/07/2019 and earlier ACR Breast Density Category c: The breast tissue is heterogeneously dense, which may obscure small masses. FINDINGS: Right breast: Mammogram: Additional 2-D and 3-D images are performed. Normal appearing fibroglandular tissue in the UPPER-OUTER QUADRANT of  the RIGHT breast. Small circumscribed oval mass is 5 millimeters in the superior portion of the RIGHT breast. Mammographic images were processed with CAD. Physical Exam: I palpate no abnormality in the UPPER-OUTER QUADRANT of the RIGHT breast. Ultrasound: Targeted ultrasound is performed, showing normal appearing fibroglandular tissue in the UPPER-OUTER QUADRANT. A small oval hypoechoic circumscribed parallel mass is identified in the 10 o'clock location 8 centimeters from the nipple measuring 0.4 x 0.2 x 0.4 centimeters. In the  10 o'clock location 6 centimeters from nipple, an oval parallel hypoechoic mass is identified in the posterior aspect of the breast, measuring 0.5 x 0.6 x 0.3 centimeters. There is no associated internal blood flow. Evaluation of the RIGHT axilla is negative for adenopathy. Left breast: Mammogram: Spot tangential view of the LEFT axilla shows a single lymph node with normal morphology. Mammographic images were processed with CAD. Ultrasound: Targeted ultrasound is performed, showing lymph nodes with normal morphology in the LEFT axilla. No suspicious findings. IMPRESSION: 1. No mammographic or ultrasound evidence for malignancy in either breast. 2. Two small probably benign masses in the UPPER-OUTER QUADRANT of the RIGHT breast warrant follow-up. We discussed management options including excision, biopsy, and close follow-up. Imaging followup is recommended at 6, 12, and 24 months to assess stability. The patient concurs with this plan. 3. No evidence for adenopathy in either axilla. RECOMMENDATION: Recommend RIGHT breast mammogram and ultrasound in 6 months to assess stability of probably benign circumscribed masses. I have discussed the findings and recommendations with the patient. Results were also provided in writing at the conclusion of the visit. If applicable, a reminder letter will be sent to the patient regarding the next appointment. BI-RADS CATEGORY  3: Probably benign. Electronically Signed   By: Nolon Nations M.D.   On: 02/13/2019 15:16   Mm Diag Breast Tomo Bilateral  Result Date: 02/13/2019 CLINICAL DATA:  The patient returns after screening study for evaluation of possible RIGHT breast asymmetry and possible LEFT axillary mass. Patient is being treated for metastatic melanoma. EXAM: DIGITAL DIAGNOSTIC BILATERAL MAMMOGRAM WITH CAD AND TOMO ULTRASOUND RIGHT BREAST ULTRASOUND LEFT AXILLA COMPARISON:  02/07/2019 and earlier ACR Breast Density Category c: The breast tissue is heterogeneously  dense, which may obscure small masses. FINDINGS: Right breast: Mammogram: Additional 2-D and 3-D images are performed. Normal appearing fibroglandular tissue in the UPPER-OUTER QUADRANT of the RIGHT breast. Small circumscribed oval mass is 5 millimeters in the superior portion of the RIGHT breast. Mammographic images were processed with CAD. Physical Exam: I palpate no abnormality in the UPPER-OUTER QUADRANT of the RIGHT breast. Ultrasound: Targeted ultrasound is performed, showing normal appearing fibroglandular tissue in the UPPER-OUTER QUADRANT. A small oval hypoechoic circumscribed parallel mass is identified in the 10 o'clock location 8 centimeters from the nipple measuring 0.4 x 0.2 x 0.4 centimeters. In the 10 o'clock location 6 centimeters from nipple, an oval parallel hypoechoic mass is identified in the posterior aspect of the breast, measuring 0.5 x 0.6 x 0.3 centimeters. There is no associated internal blood flow. Evaluation of the RIGHT axilla is negative for adenopathy. Left breast: Mammogram: Spot tangential view of the LEFT axilla shows a single lymph node with normal morphology. Mammographic images were processed with CAD. Ultrasound: Targeted ultrasound is performed, showing lymph nodes with normal morphology in the LEFT axilla. No suspicious findings. IMPRESSION: 1. No mammographic or ultrasound evidence for malignancy in either breast. 2. Two small probably benign masses in the UPPER-OUTER QUADRANT of the RIGHT breast warrant follow-up. We discussed management  options including excision, biopsy, and close follow-up. Imaging followup is recommended at 6, 12, and 24 months to assess stability. The patient concurs with this plan. 3. No evidence for adenopathy in either axilla. RECOMMENDATION: Recommend RIGHT breast mammogram and ultrasound in 6 months to assess stability of probably benign circumscribed masses. I have discussed the findings and recommendations with the patient. Results were also  provided in writing at the conclusion of the visit. If applicable, a reminder letter will be sent to the patient regarding the next appointment. BI-RADS CATEGORY  3: Probably benign. Electronically Signed   By: Nolon Nations M.D.   On: 02/13/2019 15:16   Mm 3d Screen Breast Bilateral  Result Date: 02/07/2019 CLINICAL DATA:  Screening. History of metastatic melanoma. EXAM: DIGITAL SCREENING BILATERAL MAMMOGRAM WITH TOMO AND CAD COMPARISON:  Previous exam(s). ACR Breast Density Category d: The breast tissue is extremely dense, which lowers the sensitivity of mammography. FINDINGS: In the right breast an asymmetry requires further evaluation. In the left axilla mass requires further evaluation. Previously seen bilateral subcutaneous masses compatible with metastatic melanoma have resolved in the interval. Images were processed with CAD. IMPRESSION: Further evaluation is suggested for possible asymmetry in the right breast. Further evaluation is suggested for possible mass in the left axilla. RECOMMENDATION: Diagnostic mammogram and possibly ultrasound of both breasts. (Code:FI-B-45M) The patient will be contacted regarding the findings, and additional imaging will be scheduled. BI-RADS CATEGORY  0: Incomplete. Need additional imaging evaluation and/or prior mammograms for comparison. Electronically Signed   By: Everlean Alstrom M.D.   On: 02/07/2019 17:03   [place] Axilla Left  Result Date: 02/13/2019 CLINICAL DATA:  The patient returns after screening study for evaluation of possible RIGHT breast asymmetry and possible LEFT axillary mass. Patient is being treated for metastatic melanoma. EXAM: DIGITAL DIAGNOSTIC BILATERAL MAMMOGRAM WITH CAD AND TOMO ULTRASOUND RIGHT BREAST ULTRASOUND LEFT AXILLA COMPARISON:  02/07/2019 and earlier ACR Breast Density Category c: The breast tissue is heterogeneously dense, which may obscure small masses. FINDINGS: Right breast: Mammogram: Additional 2-D and 3-D images are  performed. Normal appearing fibroglandular tissue in the UPPER-OUTER QUADRANT of the RIGHT breast. Small circumscribed oval mass is 5 millimeters in the superior portion of the RIGHT breast. Mammographic images were processed with CAD. Physical Exam: I palpate no abnormality in the UPPER-OUTER QUADRANT of the RIGHT breast. Ultrasound: Targeted ultrasound is performed, showing normal appearing fibroglandular tissue in the UPPER-OUTER QUADRANT. A small oval hypoechoic circumscribed parallel mass is identified in the 10 o'clock location 8 centimeters from the nipple measuring 0.4 x 0.2 x 0.4 centimeters. In the 10 o'clock location 6 centimeters from nipple, an oval parallel hypoechoic mass is identified in the posterior aspect of the breast, measuring 0.5 x 0.6 x 0.3 centimeters. There is no associated internal blood flow. Evaluation of the RIGHT axilla is negative for adenopathy. Left breast: Mammogram: Spot tangential view of the LEFT axilla shows a single lymph node with normal morphology. Mammographic images were processed with CAD. Ultrasound: Targeted ultrasound is performed, showing lymph nodes with normal morphology in the LEFT axilla. No suspicious findings. IMPRESSION: 1. No mammographic or ultrasound evidence for malignancy in either breast. 2. Two small probably benign masses in the UPPER-OUTER QUADRANT of the RIGHT breast warrant follow-up. We discussed management options including excision, biopsy, and close follow-up. Imaging followup is recommended at 6, 12, and 24 months to assess stability. The patient concurs with this plan. 3. No evidence for adenopathy in either axilla. RECOMMENDATION: Recommend RIGHT breast  mammogram and ultrasound in 6 months to assess stability of probably benign circumscribed masses. I have discussed the findings and recommendations with the patient. Results were also provided in writing at the conclusion of the visit. If applicable, a reminder letter will be sent to the  patient regarding the next appointment. BI-RADS CATEGORY  3: Probably benign. Electronically Signed   By: Nolon Nations M.D.   On: 02/13/2019 15:16    Impression/Plan: 1. 48 yo woman with over 50 brain metastases fromstage IVmelanoma. She has recovered well from the effects of her prior radiotherapy and remains clinically and radiographically stable. We reviewed her recent MRI brain scan which shows a good response to treatment with a stable appearance of punctate enhancing lesions throughout both cerebral hemispheres and no new lesions noted.  Her case was presented and reviewed at recent multidisciplinary brain conference and consensus recommendation is to repeat an MRI brain scan in 3 months to continue to monitor her response to treatment and rule out any evidence of disease progression or recurrence.  As per NCCN guidelines, we will continue to monitor closely with serial MRI brain scans every 3 months for an additional year and then, pending disease stability, can move to scans every 4-6 months indefinitely thereafter.  She will also continue in routine follow-up with Dr. Alen Blew for continued disease management with systemic therapy under his care and direction. Currently, the plan is to repeat systemic imaging on March 02, 2019 and consider a treatment break at that time pending her disease remains stable. She appears to have a good understanding of her disease and our recommendations and is in agreement with the stated plan.  She knows to call at any time in the interim with any questions or concerns.  2. Nocturia.  Improved on Vesicare 5mg  po qhs. We discussed taking this after supper instead of just prior to bed to see if this helps to further reduce the nocturia.  We will continue to monitor success at upcoming visits.  3. GERD. She reports the gas and indigestion has improved on Tagamet which she will continue taking BID and we will continue to follow her progress at upcoming visits.  I  spent 25 minutes in telephone conversation with the patient and more than 50% of that time was spent in counseling and/or coordination of care.     Nicholos Johns, PA-C

## 2019-02-20 MED FILL — SOLIFENACIN SUCCINATE 5 MG: 5 | 30 days supply | Qty: 30 | Fill #3

## 2019-02-21 ENCOUNTER — Telehealth: Payer: Self-pay | Admitting: Radiation Oncology

## 2019-02-21 NOTE — Telephone Encounter (Signed)
Received message from Romie Jumper that the patient is requesting to speak with Ashlyn Bruning, PA-C. Since Ashlyn isn't in the office today I phoned to inquire about need. No answer. Left message with my direct contact number encouraging the patient to phone back. Will await patient's return call.

## 2019-02-21 NOTE — Telephone Encounter (Signed)
-----   Message from [name] sent at 02/20/2019  2:00 PM EDT ----- Regarding: phone call Good Afternoon Kaitlyn Duncan,   The above patient is wanting to talk to [place] asked me to forward this to you.  Ms. Cozart phone number is [phone].  Thanks,  [employer]

## 2019-02-22 ENCOUNTER — Telehealth: Payer: Self-pay | Admitting: Radiation Oncology

## 2019-02-22 NOTE — Telephone Encounter (Signed)
Received voicemail message from patient attempting to reach [employer], PA-C. Phoned patient back. No answer. Left message with my direct contact number. On the voicemail message I explained my e-mail is secure and if she would like to leave the questions about her recent scan on the voicemail I would be glad to relay them to Ashlyn to expedite her response. Awaiting return call.

## 2019-02-23 NOTE — Telephone Encounter (Signed)
Received voicemail message from patient. Phoned patient back. No answer. Left another voicemail message. This makes day three of this RN attempting to connect via phone with the patient. In the voicemail the patient explained she would rather wait to speak with Ashlyn than leave any details with this RN. I explained this finding to [employer], PA-C. Left voicemail message with patient that Olliff Caldron, PA-C will phone her back later today to discuss her scan again and answer her questions. Provided direct contact number again should patient wish to phone this RN back.

## 2019-03-01 MED FILL — CIMETIDINE 400 MG TABLET: 400 | 30 days supply | Qty: 60 | Fill #3

## 2019-03-02 ENCOUNTER — Inpatient Hospital Stay: Payer: Medicaid Other

## 2019-03-02 ENCOUNTER — Ambulatory Visit (HOSPITAL_COMMUNITY)
Admission: RE | Admit: 2019-03-02 | Discharge: 2019-03-02 | Disposition: A | Discharge status: Home / Self Care | Payer: Medicaid Other | Source: Ambulatory Visit | Attending: Oncology | Admitting: Oncology

## 2019-03-02 ENCOUNTER — Other Ambulatory Visit: Payer: Self-pay

## 2019-03-02 DIAGNOSIS — C439 Malignant melanoma of skin, unspecified: Secondary | ICD-10-CM

## 2019-03-02 LAB — CMP (CANCER CENTER ONLY)
ALT: 16 U/L (ref 0–44)
AST: 21 U/L (ref 15–41)
Albumin: 4.2 g/dL (ref 3.5–5.0)
Alkaline Phosphatase: 97 U/L (ref 38–126)
Anion gap: 10 (ref 5–15)
BUN: 19 mg/dL (ref 6–20)
CO2: 23 mmol/L (ref 22–32)
Calcium: 9.5 mg/dL (ref 8.9–10.3)
Chloride: 107 mmol/L (ref 98–111)
Creatinine: 0.81 mg/dL (ref 0.44–1.00)
GFR, Est AFR Am: >60 mL/min (ref >60)
GFR, Estimated: >60 mL/min (ref >60)
Glucose, Bld: 97 mg/dL (ref 70–99)
Potassium: 4 mmol/L (ref 3.5–5.1)
Sodium: 140 mmol/L (ref 135–145)
Total Bilirubin: 0.4 mg/dL (ref 0.3–1.2)
Total Protein: 7.6 g/dL (ref 6.5–8.1)

## 2019-03-02 LAB — CBC WITH DIFFERENTIAL (CANCER CENTER ONLY)
Abs Immature Granulocytes: 0.01 10*3/uL (ref 0.00–0.07)
Basophils Absolute: 0 10*3/uL (ref 0.0–0.1)
Basophils Relative: 0 %
Eosinophils Absolute: 0 10*3/uL (ref 0.0–0.5)
Eosinophils Relative: 0 %
HCT: 38.7 % (ref 36.0–46.0)
Hemoglobin: 12.1 g/dL (ref 12.0–15.0)
Immature Granulocytes: 0 %
Lymphocytes Relative: 21 %
Lymphs Abs: 0.9 10*3/uL (ref 0.7–4.0)
MCH: 25.7 pg — ABNORMAL LOW (ref 26.0–34.0)
MCHC: 31.3 g/dL (ref 30.0–36.0)
MCV: 82.3 fL (ref 80.0–100.0)
Monocytes Absolute: 0.5 10*3/uL (ref 0.1–1.0)
Monocytes Relative: 11 %
Neutro Abs: 3 10*3/uL (ref 1.7–7.7)
Neutrophils Relative %: 68 %
Platelet Count: 90 10*3/uL — ABNORMAL LOW (ref 150–400)
RBC: 4.7 MIL/uL (ref 3.87–5.11)
RDW: 14.1 % (ref 11.5–15.5)
WBC Count: 4.4 10*3/uL (ref 4.0–10.5)
nRBC: 0 % (ref 0.0–0.2)

## 2019-03-02 MED ORDER — IOHEXOL 300 MG/ML  SOLN
30.0000 mL | Freq: Once | INTRAMUSCULAR | Status: AC
  Administered 2019-03-02: 30 mL via ORAL

## 2019-03-02 MED ORDER — SODIUM CHLORIDE (PF) 0.9 % IJ SOLN
INTRAMUSCULAR | Status: AC
  Filled 2019-03-02: qty 50

## 2019-03-02 MED ORDER — IOHEXOL 300 MG/ML  SOLN
100.0000 mL | Freq: Once | INTRAMUSCULAR | Status: AC | PRN
  Administered 2019-03-02: 13:00:00 100 mL via INTRAVENOUS

## 2019-03-06 ENCOUNTER — Other Ambulatory Visit: Payer: Self-pay

## 2019-03-06 ENCOUNTER — Inpatient Hospital Stay: Payer: Medicaid Other | Attending: Obstetrics and Gynecology | Admitting: Oncology

## 2019-03-06 VITALS — BP 128/87 | HR 79 | Temp 98.9°F | Ht 61.0 in | Wt 141.6 lb

## 2019-03-06 DIAGNOSIS — C7972 Secondary malignant neoplasm of left adrenal gland: Secondary | ICD-10-CM | POA: Diagnosis not present

## 2019-03-06 DIAGNOSIS — C7940 Secondary malignant neoplasm of unspecified part of nervous system: Secondary | ICD-10-CM | POA: Insufficient documentation

## 2019-03-06 DIAGNOSIS — C439 Malignant melanoma of skin, unspecified: Secondary | ICD-10-CM | POA: Diagnosis not present

## 2019-03-06 DIAGNOSIS — Z79899 Other long term (current) drug therapy: Secondary | ICD-10-CM | POA: Diagnosis not present

## 2019-03-06 DIAGNOSIS — C7971 Secondary malignant neoplasm of right adrenal gland: Secondary | ICD-10-CM | POA: Insufficient documentation

## 2019-03-06 NOTE — Progress Notes (Signed)
Hematology and Oncology Follow Up Visit  Kaitlyn Duncan [id number] [birth year] 48 y.o. 03/06/2019 1:14 PM Kaitlyn Duncan, [name], NP   Principle Diagnosis: 48 year old woman with stage IV, BRAF positive melanoma diagnosed in July 2019.  He has CNS metastasis as well as lymphadenopathy and peritoneal involvement.   Prior Therapy: He is status post subcutaneous nodule biopsy on 01/27/2018 which confirmed the presence of metastatic melanoma. She status post endoscopy on 01/28/2018 which showed subcutaneous involvement that consistent with metastatic melanoma.  Whole brain radiation for total of 30 Gy in 10 fractions started on 02/07/2018.   Mektovi 45 mg bid, [name] 450 mg daily started in August 2019.  Therapy discontinued in November 2019 after partial response.  Current therapy: Pembrolizumab 200 mg every 3 weeks started on 06/05/2018.  She is status post 12 cycles.    Interim History: Ms. Flamm is here for return evaluation.  Patient denied headaches, blurry vision, syncope or seizures.  Denies any fevers, chills or sweats.  Denied chest pain, palpitation, orthopnea or leg edema.  Denied cough, wheezing or hemoptysis.  Denied nausea, vomiting or abdominal pain.  Denies any constipation or diarrhea.  Denies any frequency urgency or hesitancy.  Denies any arthralgias or myalgias.  Denies any skin rashes or lesions.  Denies any bleeding or clotting tendency.  Denies any easy bruising.  Denies any hair or nail changes.  Denies any anxiety or depression.  Remaining review of system is negative.        .     Medications: Updated without changes. Current Outpatient Medications  Medication Sig Dispense Refill  . acetaminophen (TYLENOL) 325 MG tablet Take 650 mg by mouth every 6 (six) hours as needed.    . cetirizine (ZYRTEC) 10 MG tablet Take 10 mg by mouth as needed for allergies (for seasonal allergies).    . cimetidine (TAGAMET) 400 MG tablet Take 1 tablet (400 mg  total) by mouth 2 (two) times daily. 60 tablet 5  . ferrous sulfate 325 (65 FE) MG EC tablet Take 1 tablet (325 mg total) by mouth 2 (two) times daily. (Patient not taking: Reported on 10/30/2018) 90 tablet 3  . fluticasone (FLONASE) 50 MCG/ACT nasal spray Place 1 spray into both nostrils daily. 9.9 g 2  . Multiple Vitamin (MULTIVITAMIN WITH MINERALS) TABS tablet Take 1 tablet by mouth daily.    . mupirocin ointment (BACTROBAN) 2 % Place 1 application into the nose 2 (two) times daily. (Patient not taking: Reported on 02/14/2019) 30 g 1  . Sennosides (LAXATIVE PILLS) 15 MG TABS Take 15 mg by mouth daily.    . solifenacin (VESICARE) 5 MG tablet Take 1 tablet (5 mg total) by mouth daily. 30 tablet 5   No current facility-administered medications for this visit.      Allergies: No Known Allergies  Past Medical History, Surgical history, Social history, and Family History remain remains without any pains.  Review of Systems:    Physical Exam:  Blood pressure 128/87, pulse 79, temperature 98.9 F (37.2 C), temperature source Oral, height 5' 1" (1.549 m), weight 141 lb 9.6 oz (64.2 kg), SpO2 99 %.     ECOG: 1   General appearance: Comfortable appearing without any discomfort Head: Normocephalic without any trauma Oropharynx: Mucous membranes are moist and pink without any thrush or ulcers. Eyes: Pupils are equal and round reactive to light. Lymph nodes: No cervical, supraclavicular, inguinal or axillary lymphadenopathy.   Heart:regular rate and rhythm.  S1 and S2 without leg edema.  Lung: Clear without any rhonchi or wheezes.  No dullness to percussion. Abdomin: Soft, nontender, nondistended with good bowel sounds.  No hepatosplenomegaly. Musculoskeletal: No joint deformity or effusion.  Full range of motion noted. Neurological: No deficits noted on motor, sensory and deep tendon reflex exam. Skin: No petechial rash or dryness.  Appeared moist.                Lab  Results: Lab Results  Component Value Date   WBC 4.4 03/02/2019   HGB 12.1 03/02/2019   HCT 38.7 03/02/2019   MCV 82.3 03/02/2019   PLT 90 (L) 03/02/2019     Chemistry      Component Value Date/Time   NA 140 03/02/2019 1004   K 4.0 03/02/2019 1004   CL 107 03/02/2019 1004   CO2 23 03/02/2019 1004   BUN 19 03/02/2019 1004   CREATININE 0.81 03/02/2019 1004      Component Value Date/Time   CALCIUM 9.5 03/02/2019 1004   ALKPHOS 97 03/02/2019 1004   AST 21 03/02/2019 1004   ALT 16 03/02/2019 1004   BILITOT 0.4 03/02/2019 1004     IMPRESSION: 1. Today's study appears essentially stable compared to the prior examination. Bilateral adrenal metastases are the most notable finding. Treated pulmonary lesions appear stable compared to prior examination. There continues to be some irregular high attenuation material in the fundus of the gallbladder. Whether this represents adherent partially calcified gallstones or is indicative of gallbladder metastasis remains unclear. This could be definitively evaluated with MRI of the abdomen with and without IV gadolinium if of clinical importance. No new metastatic lesions are otherwise noted. 2. Additional incidental findings, as above.   48 year old woman with:  1.  BRAF positive melanoma presented with stage IV disease involving lymphadenopathy, peritoneal involvement as well as CNS metastasis  in July 2019.  She is status post therapy outlined above and currently on treatment break.  CT scan obtained on 03/02/2019 was personally reviewed and continues to show stable appearance and positive response to therapy.  She continues to have adrenal metastasis but no enlarging lymphadenopathy or peritoneal disease.  The natural course of this disease as well as risk of relapse was assessed and treatment options were reviewed.  These options would include combined immunotherapy, continuing single agent Pembrolizumab versus BRAF targeted therapy.   At this time, we have opted for a treatment break and repeat imaging studies in 3 months.  We will resume systemic therapy depending on her disease status and when she develops systemic relapse.  2.  CNS metastasis: MRI of the brain on 02/09/2019 showed no evidence of progression.  She will continue to have repeat imaging studies periodically.  3.  Prognosis: Therapy remains palliative and aggressive measures are warranted given her excellent performance status.  4.  Psychosocial consideration: No issues reported since the last visit.  We will continue to address that with her.  5.  Immune mediated complications: No residual complications related to immune therapy at this time.  We will continue to monitor moving forward.  6.  Anemia: Related to her malignancy and treatment.  Her hemoglobin is back to normal.  7.  Thrombocytopenia: Appears to be autoimmune related with improvement off treatment.   8.  Follow-up: In 3 months for repeat evaluation.  25  minutes was spent with the patient face-to-face today.  More than 50% of time was spent on reviewing imaging studies, laboratory data, treatment options and complications related therapy.  Zola Button, MD 9/1/20201:14  PM

## 2019-03-07 ENCOUNTER — Telehealth: Payer: Self-pay | Admitting: Oncology

## 2019-03-07 NOTE — Telephone Encounter (Signed)
Called and left msg. Mailed printout  °

## 2019-03-23 MED FILL — SOLIFENACIN SUCCINATE 5 MG: 5 | 30 days supply | Qty: 30 | Fill #4

## 2019-03-30 MED FILL — CIMETIDINE 400 MG TABLET: 400 | 30 days supply | Qty: 60 | Fill #4

## 2019-04-26 ENCOUNTER — Other Ambulatory Visit: Payer: Self-pay | Admitting: Radiation Therapy

## 2019-04-26 DIAGNOSIS — C7931 Secondary malignant neoplasm of brain: Secondary | ICD-10-CM

## 2019-04-26 DIAGNOSIS — C7949 Secondary malignant neoplasm of other parts of nervous system: Secondary | ICD-10-CM

## 2019-04-26 MED FILL — SOLIFENACIN SUCCINATE 5 MG: 5 | 30 days supply | Qty: 30 | Fill #5

## 2019-04-30 ENCOUNTER — Other Ambulatory Visit: Payer: Self-pay | Admitting: Radiation Therapy

## 2019-04-30 ENCOUNTER — Telehealth: Payer: Self-pay | Admitting: Radiation Therapy

## 2019-04-30 NOTE — Telephone Encounter (Signed)
Left a detailed message about the patient's upcoming brain MRI and follow-up on her cell. I explained that the follow-up visit will not be done in person, but Ashlyn will call her with those results over the phone. Due to Covid precautions, we are limiting unnecessary exposure when possible.   Mont Dutton R.T.(R)(T) Radiation Special Procedures Navigator

## 2019-05-03 MED FILL — CIMETIDINE 400 MG TABLET: 400 | 30 days supply | Qty: 60 | Fill #5

## 2019-05-16 ENCOUNTER — Ambulatory Visit
Admission: RE | Admit: 2019-05-16 | Discharge: 2019-05-16 | Disposition: A | Discharge status: Home / Self Care | Payer: Medicaid Other | Source: Ambulatory Visit | Attending: Radiation Oncology | Admitting: Radiation Oncology

## 2019-05-16 ENCOUNTER — Other Ambulatory Visit: Payer: Self-pay

## 2019-05-16 DIAGNOSIS — C7949 Secondary malignant neoplasm of other parts of nervous system: Secondary | ICD-10-CM

## 2019-05-16 DIAGNOSIS — C7931 Secondary malignant neoplasm of brain: Secondary | ICD-10-CM

## 2019-05-16 MED ORDER — GADOBENATE DIMEGLUMINE 529 MG/ML IV SOLN
13.0000 mL | Freq: Once | INTRAVENOUS | Status: AC | PRN
  Administered 2019-05-16: 13 mL via INTRAVENOUS

## 2019-05-21 ENCOUNTER — Inpatient Hospital Stay: Payer: Medicaid Other | Attending: Obstetrics and Gynecology

## 2019-05-23 ENCOUNTER — Ambulatory Visit
Admission: RE | Admit: 2019-05-23 | Discharge: 2019-05-23 | Disposition: A | Discharge status: Home / Self Care | Payer: Medicaid Other | Source: Ambulatory Visit | Attending: Urology | Admitting: Urology

## 2019-05-23 DIAGNOSIS — C7931 Secondary malignant neoplasm of brain: Secondary | ICD-10-CM

## 2019-05-25 NOTE — Progress Notes (Signed)
Radiation Oncology         [phone]) [phone] ________________________________  Name: Kaitlyn Duncan MRN: [id number]  Date: 05/23/2019  DOB: 1971/04/20  Post Treatment Note  CC: [name], NP  [name], [name], NP  Diagnosis:   48 yo woman with over 4 brain metastases fromstage IVmelanoma  Interval Since Last Radiation:  1 year and 3 months 02/06/2018 to 02/17/2018:   The whole brain was treated to 30 Gy in 10 fractions of 3 Gy  Narrative:  I spoke with the patient to conduct her routine scheduled 3 follow up visit and to discuss results from her recent follow up MRI via telephone to spare the patient unnecessary potential exposure in the healthcare setting during the current COVID-19 pandemic.  The patient was notified in advance and gave permission to proceed with this visit format. Her scan from 05/16/19 shows a stable appearance of the previously treated numerous small metastatic deposits throughout the brain as compared to the previous study and NO new lesions.  Her case was reviewed in the multidisciplinary brain conference on 05/21/19 with recommendation to continue with 3 month surveillance scans. She has recently discontinued the Pembrolizumab and is on a treatment break under the care and direction of Dr. Alen Blew. She developed a rash on her upper extremities which she recently had evaluated with her dermatologist and is currently being treated for a fungal infection with gradual improvement.  She completed 12 cycles of Pembrolizumab in 02/2019 and repeat systemic imaging on 03/02/19 for disease restaging showed a stable appearance of her disease with favorable response to therapy though there is persistent bilateral adrenal metastases but no enlarging lymphadenopathy and no new metastatic lesions.  On review of systems, the patient states that she continues doing well overall and has felt stronger each day since discontinuing her systemic therapy.  She has not had any significant  headaches, tinnitus, dizziness/imbalance, tremor or seizure activity.  She denies chest pain, shortness of breath, wheezing, cough or hemoptysis.  She has remained off Keppra, initially prescribed as prophylactic, as per direction of Dr. Mickeal Skinner in 09/2018.  She denies any seizure activity since discontinuation. She reports improved appetite and is maintaining her weight.  She denies N/V, constipation or diarrhea.  She reports some improvement in the gas and indigestion since starting Tagamet which she continues to take BID.  She was initially treated with Mektovi 45 mg bid and [name] 450 mg daily systemic chemotherapy under the care and direction of Dr. Alen Blew- started on March 02, 2018 but this was discontinued in November 2019 after partial response.  She was started on Pembrolizumab 200 mg every 3 weeks- started 06/05/18 and completed 12 cycles in 02/2019.  She has noted continued improvement, in fact, almost complete resolution of the palpable scalp lesions which she is quite pleased with. She has mild itching and dryness of her scalp as well as a mildly itchy rash on her arms and the right side of her face, currently treated with an antifungal as prescribed by her dermatologist and noticing gradual improvement.  She continues on Vesicare 5mg  po daily for management of her OAB and this is helping significantly reduce her nocturia and she is getting better rest.  She tolerates this medication well.  ALLERGIES:  has No Known Allergies.  Meds: Current Outpatient Medications  Medication Sig Dispense Refill  . acetaminophen (TYLENOL) 325 MG tablet Take 650 mg by mouth every 6 (six) hours as needed.    . cetirizine (ZYRTEC) 10 MG tablet  Take 10 mg by mouth as needed for allergies (for seasonal allergies).    . cimetidine (TAGAMET) 400 MG tablet Take 1 tablet (400 mg total) by mouth 2 (two) times daily. 60 tablet 5  . ferrous sulfate 325 (65 FE) MG EC tablet Take 1 tablet (325 mg total) by mouth 2 (two) times  daily. (Patient not taking: Reported on 10/30/2018) 90 tablet 3  . fluticasone (FLONASE) 50 MCG/ACT nasal spray Place 1 spray into both nostrils daily. 9.9 g 2  . Multiple Vitamin (MULTIVITAMIN WITH MINERALS) TABS tablet Take 1 tablet by mouth daily.    . mupirocin ointment (BACTROBAN) 2 % Place 1 application into the nose 2 (two) times daily. (Patient not taking: Reported on 02/14/2019) 30 g 1  . Sennosides (LAXATIVE PILLS) 15 MG TABS Take 15 mg by mouth daily.    . solifenacin (VESICARE) 5 MG tablet Take 1 tablet (5 mg total) by mouth daily. 30 tablet 5   No current facility-administered medications for this encounter.     Physical Findings:  vitals were not taken for this visit.  Unable to assess due to telehealth visit format.  Lab Findings: Lab Results  Component Value Date   WBC 4.4 03/02/2019   HGB 12.1 03/02/2019   HCT 38.7 03/02/2019   MCV 82.3 03/02/2019   PLT 90 (L) 03/02/2019    Radiographic Findings: Mr [name] [id number] Contrast  Result Date: 05/16/2019 CLINICAL DATA:  Secondary malignant neoplasm of brain and spinal cord. Follow-up CNS multiple (>3) metastatic lesions on MRI, monitor, post WBRT or SRS. Additional history provided: History of melanoma and prior radiation. EXAM: MRI HEAD WITHOUT AND WITH CONTRAST TECHNIQUE: Multiplanar, multiecho pulse sequences of the brain and surrounding structures were obtained without and with intravenous contrast. CONTRAST:  40mL MULTIHANCE GADOBENATE DIMEGLUMINE 529 MG/ML IV SOLN COMPARISON:  Brain MRI examinations 02/09/2019 and earlier FINDINGS: Brain: There is no evidence of acute infarct. No midline shift or extra-axial fluid collection. No hydrocephalus. Patchy and confluent T2/FLAIR hyperintensity within the cerebral white matter has progressed, consistent with sequela of prior radiation therapy. Again demonstrated are numerous punctate and small lesions scattered throughout the brain demonstrating intrinsic T1 hyperintensity and SWI  signal loss. No significant interval change is identified. As before, the largest residual lesions measure 10 mm in the right parietooccipital region (series 7, image 46) and 7 mm in the medial right thalamus (series 9, image 82) (series 7, image 51). Vascular: Flow voids maintained within the proximal large arterial vessels. Skull and upper cervical spine: No focal marrow lesion Sinuses/Orbits: Visualized orbits demonstrate no acute abnormality. Minimal ethmoid sinus mucosal thickening. Chronically opacified and atelectatic left maxillary sinus. Persistent small left mastoid effusion. IMPRESSION: 1. Numerous small scattered treated brain metastases are unchanged. No new intracranial lesion is identified. 2. Post radiation changes within the cerebral white matter have progressed. Electronically Signed   By: Kellie Simmering DO   On: 05/16/2019 14:45    Impression/Plan: 44. 48 yo woman with over 60 brain metastases fromstage IVmelanoma. She has recovered well from the effects of her prior radiotherapy and remains clinically and radiographically stable. We reviewed her recent MRI brain scan which shows a good response to treatment with a stable appearance of punctate enhancing lesions throughout both cerebral hemispheres and no new lesions noted.  Her case was presented and reviewed at recent multidisciplinary brain conference and consensus recommendation is to repeat an MRI brain scan in 3 months to continue to monitor her response to treatment and  rule out any evidence of disease progression or recurrence.  As per NCCN guidelines, we will continue to monitor closely with serial MRI brain scans every 3 months for an additional year and then, pending disease stability, can move to scans every 4-6 months indefinitely thereafter.  She will also continue in routine follow-up with Dr. Alen Blew for continued disease management under his care and direction.  She is scheduled for repeat systemic imaging on 06/05/2019 prior to  her follow-up visit with Dr. Alen Blew on 06/07/2019 to determine whether she can continue in observation only or if there is a need to resume systemic therapy. She appears to have a good understanding of her disease and our recommendations and is in agreement with the stated plan.  She knows to call at any time in the interim with any questions or concerns.  2. Nocturia.  Improved on Vesicare 5mg  po qhs.  She has noticed improvement in her ability to get a good nights rest with taking this after supper which she plans to continue.  We will continue to monitor success at upcoming visits.  3. GERD. She reports the gas and indigestion has improved on Tagamet which she will continue taking BID and we will continue to follow her progress at upcoming visits.  I spent 25 minutes in telephone conversation with the patient and more than 50% of that time was spent in counseling and/or coordination of care.     Nicholos Johns, PA-C

## 2019-06-01 ENCOUNTER — Other Ambulatory Visit: Payer: Self-pay | Admitting: Urology

## 2019-06-02 MED FILL — SOLIFENACIN SUCCINATE 5 MG: 5 | 30 days supply | Qty: 30 | Fill #0

## 2019-06-04 ENCOUNTER — Other Ambulatory Visit: Payer: Self-pay

## 2019-06-04 DIAGNOSIS — C439 Malignant melanoma of skin, unspecified: Secondary | ICD-10-CM

## 2019-06-04 MED FILL — FLUTICASONE PROP 50 MCG SPR: 50 | 30 days supply | Qty: 16 | Fill #1

## 2019-06-05 ENCOUNTER — Other Ambulatory Visit: Payer: Self-pay | Admitting: Medical

## 2019-06-05 ENCOUNTER — Inpatient Hospital Stay: Payer: Medicaid Other | Attending: Obstetrics and Gynecology

## 2019-06-05 ENCOUNTER — Ambulatory Visit (HOSPITAL_COMMUNITY)
Admission: RE | Admit: 2019-06-05 | Discharge: 2019-06-05 | Disposition: A | Discharge status: Home / Self Care | Payer: Medicaid Other | Source: Ambulatory Visit | Attending: Oncology | Admitting: Oncology

## 2019-06-05 ENCOUNTER — Encounter (HOSPITAL_COMMUNITY): Payer: Self-pay

## 2019-06-05 ENCOUNTER — Other Ambulatory Visit: Payer: Self-pay

## 2019-06-05 DIAGNOSIS — C7951 Secondary malignant neoplasm of bone: Secondary | ICD-10-CM | POA: Insufficient documentation

## 2019-06-05 DIAGNOSIS — K5900 Constipation, unspecified: Secondary | ICD-10-CM | POA: Diagnosis not present

## 2019-06-05 DIAGNOSIS — C439 Malignant melanoma of skin, unspecified: Secondary | ICD-10-CM | POA: Insufficient documentation

## 2019-06-05 DIAGNOSIS — Z79899 Other long term (current) drug therapy: Secondary | ICD-10-CM | POA: Insufficient documentation

## 2019-06-05 DIAGNOSIS — C7940 Secondary malignant neoplasm of unspecified part of nervous system: Secondary | ICD-10-CM | POA: Diagnosis not present

## 2019-06-05 DIAGNOSIS — Z923 Personal history of irradiation: Secondary | ICD-10-CM | POA: Insufficient documentation

## 2019-06-05 DIAGNOSIS — R040 Epistaxis: Secondary | ICD-10-CM

## 2019-06-05 DIAGNOSIS — C7972 Secondary malignant neoplasm of left adrenal gland: Secondary | ICD-10-CM | POA: Diagnosis not present

## 2019-06-05 DIAGNOSIS — C7971 Secondary malignant neoplasm of right adrenal gland: Secondary | ICD-10-CM | POA: Insufficient documentation

## 2019-06-05 HISTORY — DX: Malignant (primary) neoplasm, unspecified: C80.1

## 2019-06-05 LAB — CMP (CANCER CENTER ONLY)
ALT: 11 U/L (ref 0–44)
AST: 17 U/L (ref 15–41)
Albumin: 4 g/dL (ref 3.5–5.0)
Alkaline Phosphatase: 81 U/L (ref 38–126)
Anion gap: 11 (ref 5–15)
BUN: 21 mg/dL — ABNORMAL HIGH (ref 6–20)
CO2: 21 mmol/L — ABNORMAL LOW (ref 22–32)
Calcium: 9.2 mg/dL (ref 8.9–10.3)
Chloride: 107 mmol/L (ref 98–111)
Creatinine: 0.75 mg/dL (ref 0.44–1.00)
GFR, Est AFR Am: >60 mL/min (ref >60)
GFR, Estimated: >60 mL/min (ref >60)
Glucose, Bld: 112 mg/dL — ABNORMAL HIGH (ref 70–99)
Potassium: 4 mmol/L (ref 3.5–5.1)
Sodium: 139 mmol/L (ref 135–145)
Total Bilirubin: 0.2 mg/dL — ABNORMAL LOW (ref 0.3–1.2)
Total Protein: 7 g/dL (ref 6.5–8.1)

## 2019-06-05 LAB — CBC WITH DIFFERENTIAL (CANCER CENTER ONLY)
Abs Immature Granulocytes: 0.03 10*3/uL (ref 0.00–0.07)
Basophils Absolute: 0 10*3/uL (ref 0.0–0.1)
Basophils Relative: 0 %
Eosinophils Absolute: 0 10*3/uL (ref 0.0–0.5)
Eosinophils Relative: 0 %
HCT: 37.3 % (ref 36.0–46.0)
Hemoglobin: 11.6 g/dL — ABNORMAL LOW (ref 12.0–15.0)
Immature Granulocytes: 1 %
Lymphocytes Relative: 18 %
Lymphs Abs: 1.1 10*3/uL (ref 0.7–4.0)
MCH: 25.8 pg — ABNORMAL LOW (ref 26.0–34.0)
MCHC: 31.1 g/dL (ref 30.0–36.0)
MCV: 82.9 fL (ref 80.0–100.0)
Monocytes Absolute: 0.6 10*3/uL (ref 0.1–1.0)
Monocytes Relative: 10 %
Neutro Abs: 4.5 10*3/uL (ref 1.7–7.7)
Neutrophils Relative %: 71 %
Platelet Count: 99 10*3/uL — ABNORMAL LOW (ref 150–400)
RBC: 4.5 MIL/uL (ref 3.87–5.11)
RDW: 13.6 % (ref 11.5–15.5)
WBC Count: 6.3 10*3/uL (ref 4.0–10.5)
nRBC: 0 % (ref 0.0–0.2)

## 2019-06-05 MED ORDER — IOHEXOL 300 MG/ML  SOLN
100.0000 mL | Freq: Once | INTRAMUSCULAR | Status: AC | PRN
  Administered 2019-06-05: 100 mL via INTRAVENOUS

## 2019-06-05 MED ORDER — SODIUM CHLORIDE (PF) 0.9 % IJ SOLN
INTRAMUSCULAR | Status: AC
  Filled 2019-06-05: qty 50

## 2019-06-05 MED ORDER — HEPARIN SOD (PORK) LOCK FLUSH 100 UNIT/ML IV SOLN
INTRAVENOUS | Status: AC
  Filled 2019-06-05: qty 5

## 2019-06-05 MED ORDER — IOHEXOL 12 MG/ML PO SOLN: 500.0000 mL | ORAL | Status: AC

## 2019-06-05 MED ORDER — IOHEXOL 9 MG/ML PO SOLN
ORAL | Status: AC
  Administered 2019-06-05: 500 mL
  Filled 2019-06-05: qty 1000

## 2019-06-05 MED FILL — MUPIROCIN 2% OINTMENT: 2 | 10 days supply | Qty: 22 | Fill #0

## 2019-06-05 NOTE — Telephone Encounter (Signed)
Refill request

## 2019-06-07 ENCOUNTER — Inpatient Hospital Stay (HOSPITAL_BASED_OUTPATIENT_CLINIC_OR_DEPARTMENT_OTHER): Payer: Medicaid Other | Admitting: Oncology

## 2019-06-07 ENCOUNTER — Other Ambulatory Visit: Payer: Self-pay

## 2019-06-07 VITALS — BP 119/77 | HR 87 | Temp 98.5°F | Resp 18 | Ht 61.0 in | Wt 144.1 lb

## 2019-06-07 DIAGNOSIS — C439 Malignant melanoma of skin, unspecified: Secondary | ICD-10-CM | POA: Diagnosis not present

## 2019-06-07 NOTE — Progress Notes (Signed)
Hematology and Oncology Follow Up Visit  Kaitlyn Duncan [id number] Oct 23, 1970 48 y.o. 06/07/2019 1:04 PM Kaitlyn Duncan, [name], NP   Principle Diagnosis: 48 year old woman with melanoma of unknown primary diagnosed in July 2019.  She presented with stage IV, BRAF positive disease including lymphadenopathy and CNS metastasis..   Prior Therapy: He is status post subcutaneous nodule biopsy on 01/27/2018 which confirmed the presence of metastatic melanoma. She status post endoscopy on 01/28/2018 which showed subcutaneous involvement that consistent with metastatic melanoma.  Whole brain radiation for total of 30 Gy in 10 fractions started on 02/07/2018.   Mektovi 45 mg bid, [name] 450 mg daily started in August 2019.  Therapy discontinued in November 2019 after partial response.   Pembrolizumab 200 mg every 3 weeks started on 06/05/2018.  She is status post 12 cycles completed in July 2020.    Current therapy: Active surveillance.    Interim History: Kaitlyn Duncan presents today for a follow-up.  Since the last visit, she reports feeling well without any recent complaints.  She has regained almost all activities of daily living without any residual complications to previous treatments.  She denies any nausea, fatigue or vomiting.  She denies any headaches or blurry vision.  Continues to ambulate without any recent falls or syncope.  He denied any alteration mental status, neuropathy, confusion or dizziness.  Denies any headaches or lethargy.  Denies any night sweats, weight loss or changes in appetite.  Denied orthopnea, dyspnea on exertion or chest discomfort.  Denies shortness of breath, difficulty breathing hemoptysis or cough.  Denies any abdominal distention, nausea, early satiety or dyspepsia.  Denies any hematuria, frequency, dysuria or nocturia.  Denies any skin irritation, dryness or rash.  Denies any ecchymosis or petechiae.  Denies any lymphadenopathy or clotting.   Denies any heat or cold intolerance.  Denies any anxiety or depression.  Remaining review of system is negative.            .     Medications: Unchanged on review. Current Outpatient Medications  Medication Sig Dispense Refill  . acetaminophen (TYLENOL) 325 MG tablet Take 650 mg by mouth every 6 (six) hours as needed.    . cetirizine (ZYRTEC) 10 MG tablet Take 10 mg by mouth as needed for allergies (for seasonal allergies).    . cimetidine (TAGAMET) 400 MG tablet Take 1 tablet (400 mg total) by mouth 2 (two) times daily. 60 tablet 5  . ferrous sulfate 325 (65 FE) MG EC tablet Take 1 tablet (325 mg total) by mouth 2 (two) times daily. (Patient not taking: Reported on 10/30/2018) 90 tablet 3  . fluticasone (FLONASE) 50 MCG/ACT nasal spray Place 1 spray into both nostrils daily. 9.9 g 2  . Multiple Vitamin (MULTIVITAMIN WITH MINERALS) TABS tablet Take 1 tablet by mouth daily.    . mupirocin ointment (BACTROBAN) 2 % APPLY INTO THE NOSE AS DIRECTED TWICE DAILY 22 g 1  . Sennosides (LAXATIVE PILLS) 15 MG TABS Take 15 mg by mouth daily.    . solifenacin (VESICARE) 5 MG tablet TAKE 1 TABLET (5 MG TOTAL) BY MOUTH DAILY. 30 tablet 5   No current facility-administered medications for this visit.      Allergies: No Known Allergies  Past Medical History, Surgical history, Social history, and Family History without any changes.     Physical Exam:   Blood pressure 119/77, pulse 87, temperature 98.5 F (36.9 C), temperature source Temporal, resp. rate 18, height _0  (1.549 m), weight 144  lb 1.6 oz (65.4 kg), SpO2 99 %.     ECOG: 1    General appearance: Alert, awake without any distress. Head: Atraumatic without abnormalities Oropharynx: Without any thrush or ulcers. Eyes: No scleral icterus. Lymph nodes: No lymphadenopathy noted in the cervical, supraclavicular, or axillary nodes Heart:regular rate and rhythm, without any murmurs or gallops.   Lung: Clear to auscultation  without any rhonchi, wheezes or dullness to percussion. Abdomin: Soft, nontender without any shifting dullness or ascites. Musculoskeletal: No clubbing or cyanosis. Neurological: No motor or sensory deficits. Skin: No rashes or lesions. Psychiatric: Mood and affect appeared normal.                Lab Results: Lab Results  Component Value Date   WBC 6.3 06/05/2019   HGB 11.6 (L) 06/05/2019   HCT 37.3 06/05/2019   MCV 82.9 06/05/2019   PLT 99 (L) 06/05/2019     Chemistry      Component Value Date/Time   NA 139 06/05/2019 0959   K 4.0 06/05/2019 0959   CL 107 06/05/2019 0959   CO2 21 (L) 06/05/2019 0959   BUN 21 (H) 06/05/2019 0959   CREATININE 0.75 06/05/2019 0959      Component Value Date/Time   CALCIUM 9.2 06/05/2019 0959   ALKPHOS 81 06/05/2019 0959   AST 17 06/05/2019 0959   ALT 11 06/05/2019 0959   BILITOT 0.2 (L) 06/05/2019 0959     IMPRESSION: 1. Mild reduction in size of the bilateral adrenal metastatic lesions. 2. Roughly stable enhancing mass in the gallbladder fundus, possibly a prominent polyp or metastatic lesion. Gallbladder polyps of this size may degenerate into malignancy. 3. Mild increase serpentine fluid density along the left adnexa suggesting hydrosalpinx. Right ovarian cyst or right hydrosalpinx noted. 4. Enhancing lesions in the spleen and caudate lobe of the liver, stable, nonspecific. These could be hemangiomas or less likely metastatic lesions. 5. Upper normal endometrial thickness, possible polyp or submucosal fibroid along the endometrium unchanged. Adjacent myometrial fibroids noted. 6. Sclerotic right sacral lesion with adjacent trabecular accentuation. Trabecular accentuation in lucency along the left upper acetabulum. Possibilities may include residua from prior metastatic disease, Paget's disease of bone, or unusual hemangiomas. There is also a hemangioma at the T12 vertebral level. 7. Other imaging findings of  potential clinical significance: Mild cardiomegaly. Airway thickening is present, suggesting bronchitis or reactive airways disease. Duplicated right renal collecting system. Mildly enlarged uterus. Prominent stool throughout the colon favors constipation.  48 year old woman with:  1.  Stage IV melanoma diagnosed in July 2019.  She presented with unknown primary and involvement with BRAF positive disease in the brain as well as peritoneum.    She continues to be on active surveillance after completing therapy outlined above with excellent overall radiographic response.  Imaging studies obtained on June 05, 2019 was personally reviewed and continues to show reasonable radiographic response to therapy at this time.  Risks and benefits of continuing this approach versus using different salvage therapy options was discussed.  Restarting immunotherapy would be one option single agent or double.  For the time being she is agreeable to continue with the current approach of active surveillance.  2.  CNS metastasis: She is status post radiation therapy with repeat MRI obtained in November 2020 showed no evidence of new metastasis.  She will continue be on active surveillance regarding this issue.  Repeat in 6 months would be reasonable.   3.  Prognosis: Her disease is incurable although she had  experienced excellent response and benefits for the time being. Aggressive measures are warranted given her disease status as well as excellent performance status.   4.  Psychosocial consideration: No recent issues reported at this time.    5.  Immune mediated complications: We continue to educate her about potential delayed complications related to immunotherapy.  These include pneumonitis, colitis among others.  6.  Anemia: Due to malignancy and previous treatment.  Hemoglobin is adequate at this time.  We will continue to monitor.  7.  Thrombocytopenia: Remains mild and asymptomatic without any bleeding  episodes.  Etiology likely related to autoimmune versus malignancy.  8.  Follow-up: She will return in 4 months for a follow-up.  25  minutes was spent with the patient face-to-face today.  More than 50% of time was dedicated to reviewing her imaging studies, updating disease status, treatment options and answering questions regarding future plan of care.  Zola Button, MD 12/3/20201:04 PM

## 2019-06-08 ENCOUNTER — Telehealth: Payer: Self-pay | Admitting: *Deleted

## 2019-06-08 NOTE — Telephone Encounter (Signed)
Kaitlyn Duncan states she forgot to tell Dr Alen Blew that she is still having burping, gas and indigestion.

## 2019-06-08 NOTE — Telephone Encounter (Signed)
Over the counter medication is fine.

## 2019-06-18 ENCOUNTER — Other Ambulatory Visit: Payer: Self-pay | Admitting: Urology

## 2019-06-20 ENCOUNTER — Other Ambulatory Visit: Payer: Self-pay | Admitting: Radiation Oncology

## 2019-06-20 MED ORDER — CIMETIDINE 400 MG PO TABS: 400.0000 mg | ORAL_TABLET | Freq: Two times a day (BID) | ORAL | 5 refills | Status: DC

## 2019-06-20 MED FILL — CIMETIDINE 400 MG TABLET: 400 | 30 days supply | Qty: 60 | Fill #0

## 2019-07-02 MED FILL — SOLIFENACIN SUCCINATE 5 MG: 5 | 30 days supply | Qty: 30 | Fill #1

## 2019-07-05 ENCOUNTER — Other Ambulatory Visit: Payer: Self-pay | Admitting: *Deleted

## 2019-07-05 DIAGNOSIS — C7931 Secondary malignant neoplasm of brain: Secondary | ICD-10-CM

## 2019-08-03 ENCOUNTER — Telehealth: Payer: Self-pay | Admitting: Radiation Therapy

## 2019-08-03 ENCOUNTER — Other Ambulatory Visit: Payer: Self-pay | Admitting: Radiation Therapy

## 2019-08-03 NOTE — Telephone Encounter (Signed)
Left a detailed voicemail about her requested MRI and follow-up appointment change. Her scan and telephone visit with Ashlyn have been pushed out to May per her request. I included my contact information for her to call back if she has questions or a conflict.   Mont Dutton R.T.(R)(T) Radiation Special Procedures Navigator

## 2019-08-10 MED FILL — CIMETIDINE 400 MG TABLET: 400 | 30 days supply | Qty: 60 | Fill #1

## 2019-08-14 MED FILL — SOLIFENACIN SUCCINATE 5 MG: 5 | 30 days supply | Qty: 30 | Fill #2

## 2019-08-17 ENCOUNTER — Other Ambulatory Visit: Payer: Medicaid Other

## 2019-08-22 ENCOUNTER — Ambulatory Visit: Payer: Self-pay | Admitting: Urology

## 2019-08-22 ENCOUNTER — Other Ambulatory Visit: Payer: Self-pay

## 2019-08-22 ENCOUNTER — Ambulatory Visit
Admission: RE | Admit: 2019-08-22 | Discharge: 2019-08-22 | Disposition: A | Discharge status: Home / Self Care | Payer: Medicaid Other | Source: Ambulatory Visit | Attending: Nurse Practitioner | Admitting: Nurse Practitioner

## 2019-08-22 ENCOUNTER — Other Ambulatory Visit: Payer: Self-pay | Admitting: Nurse Practitioner

## 2019-08-22 DIAGNOSIS — N6489 Other specified disorders of breast: Secondary | ICD-10-CM

## 2019-08-31 ENCOUNTER — Telehealth: Payer: Self-pay | Admitting: Oncology

## 2019-08-31 NOTE — Telephone Encounter (Signed)
Scheduled appt per 2/26 sch msg. Left voicemail with appt details. Mailed reminder letter and calender.

## 2019-09-04 ENCOUNTER — Ambulatory Visit (HOSPITAL_COMMUNITY)
Admission: RE | Admit: 2019-09-04 | Discharge: 2019-09-04 | Disposition: A | Discharge status: Home / Self Care | Payer: Medicaid Other | Source: Ambulatory Visit | Attending: Oncology | Admitting: Oncology

## 2019-09-04 ENCOUNTER — Other Ambulatory Visit: Payer: Self-pay

## 2019-09-04 ENCOUNTER — Other Ambulatory Visit: Payer: Medicaid Other

## 2019-09-04 ENCOUNTER — Inpatient Hospital Stay: Payer: Medicaid Other

## 2019-09-04 ENCOUNTER — Encounter (HOSPITAL_COMMUNITY): Payer: Self-pay | Admitting: Radiology

## 2019-09-04 ENCOUNTER — Inpatient Hospital Stay: Payer: Medicaid Other | Attending: Obstetrics and Gynecology

## 2019-09-04 DIAGNOSIS — C7972 Secondary malignant neoplasm of left adrenal gland: Secondary | ICD-10-CM | POA: Diagnosis not present

## 2019-09-04 DIAGNOSIS — D696 Thrombocytopenia, unspecified: Secondary | ICD-10-CM | POA: Insufficient documentation

## 2019-09-04 DIAGNOSIS — C7971 Secondary malignant neoplasm of right adrenal gland: Secondary | ICD-10-CM | POA: Diagnosis not present

## 2019-09-04 DIAGNOSIS — C792 Secondary malignant neoplasm of skin: Secondary | ICD-10-CM | POA: Insufficient documentation

## 2019-09-04 DIAGNOSIS — C439 Malignant melanoma of skin, unspecified: Secondary | ICD-10-CM | POA: Diagnosis not present

## 2019-09-04 DIAGNOSIS — C778 Secondary and unspecified malignant neoplasm of lymph nodes of multiple regions: Secondary | ICD-10-CM | POA: Diagnosis not present

## 2019-09-04 DIAGNOSIS — C7931 Secondary malignant neoplasm of brain: Secondary | ICD-10-CM | POA: Diagnosis not present

## 2019-09-04 DIAGNOSIS — Z9221 Personal history of antineoplastic chemotherapy: Secondary | ICD-10-CM | POA: Insufficient documentation

## 2019-09-04 DIAGNOSIS — Z923 Personal history of irradiation: Secondary | ICD-10-CM | POA: Insufficient documentation

## 2019-09-04 DIAGNOSIS — C786 Secondary malignant neoplasm of retroperitoneum and peritoneum: Secondary | ICD-10-CM | POA: Insufficient documentation

## 2019-09-04 LAB — CBC WITH DIFFERENTIAL (CANCER CENTER ONLY)
Abs Immature Granulocytes: 0.01 10*3/uL (ref 0.00–0.07)
Basophils Absolute: 0 10*3/uL (ref 0.0–0.1)
Basophils Relative: 0 %
Eosinophils Absolute: 0 10*3/uL (ref 0.0–0.5)
Eosinophils Relative: 0 %
HCT: 41.2 % (ref 36.0–46.0)
Hemoglobin: 13.2 g/dL (ref 12.0–15.0)
Immature Granulocytes: 0 %
Lymphocytes Relative: 25 %
Lymphs Abs: 1 10*3/uL (ref 0.7–4.0)
MCH: 26.8 pg (ref 26.0–34.0)
MCHC: 32 g/dL (ref 30.0–36.0)
MCV: 83.7 fL (ref 80.0–100.0)
Monocytes Absolute: 0.5 10*3/uL (ref 0.1–1.0)
Monocytes Relative: 11 %
Neutro Abs: 2.5 10*3/uL (ref 1.7–7.7)
Neutrophils Relative %: 64 %
Platelet Count: 74 10*3/uL — ABNORMAL LOW (ref 150–400)
RBC: 4.92 MIL/uL (ref 3.87–5.11)
RDW: 13.9 % (ref 11.5–15.5)
WBC Count: 4 10*3/uL (ref 4.0–10.5)
nRBC: 0 % (ref 0.0–0.2)

## 2019-09-04 LAB — CMP (CANCER CENTER ONLY)
ALT: 10 U/L (ref 0–44)
AST: 19 U/L (ref 15–41)
Albumin: 4.2 g/dL (ref 3.5–5.0)
Alkaline Phosphatase: 85 U/L (ref 38–126)
Anion gap: 12 (ref 5–15)
BUN: 17 mg/dL (ref 6–20)
CO2: 21 mmol/L — ABNORMAL LOW (ref 22–32)
Calcium: 9.2 mg/dL (ref 8.9–10.3)
Chloride: 108 mmol/L (ref 98–111)
Creatinine: 0.82 mg/dL (ref 0.44–1.00)
GFR, Est AFR Am: >60 mL/min (ref >60)
GFR, Estimated: >60 mL/min (ref >60)
Glucose, Bld: 132 mg/dL — ABNORMAL HIGH (ref 70–99)
Potassium: 3.7 mmol/L (ref 3.5–5.1)
Sodium: 141 mmol/L (ref 135–145)
Total Bilirubin: 0.4 mg/dL (ref 0.3–1.2)
Total Protein: 7.4 g/dL (ref 6.5–8.1)

## 2019-09-04 MED ORDER — SODIUM CHLORIDE (PF) 0.9 % IJ SOLN
INTRAMUSCULAR | Status: AC
  Filled 2019-09-04: qty 50

## 2019-09-04 MED ORDER — IOHEXOL 9 MG/ML PO SOLN
ORAL | Status: AC
  Filled 2019-09-04: qty 1000

## 2019-09-04 MED ORDER — IOHEXOL 9 MG/ML PO SOLN
500.0000 mL | ORAL | Status: AC
  Administered 2019-09-04: 1000 mL via ORAL

## 2019-09-04 MED ORDER — IOHEXOL 300 MG/ML  SOLN
100.0000 mL | Freq: Once | INTRAMUSCULAR | Status: AC | PRN
  Administered 2019-09-04: 100 mL via INTRAVENOUS

## 2019-09-06 ENCOUNTER — Ambulatory Visit (HOSPITAL_COMMUNITY): Payer: Medicaid Other

## 2019-09-07 ENCOUNTER — Inpatient Hospital Stay (HOSPITAL_BASED_OUTPATIENT_CLINIC_OR_DEPARTMENT_OTHER): Payer: Medicaid Other | Admitting: Oncology

## 2019-09-07 ENCOUNTER — Other Ambulatory Visit: Payer: Self-pay

## 2019-09-07 VITALS — BP 136/75 | HR 78 | Temp 98.3°F | Resp 16 | Ht 61.0 in | Wt 138.5 lb

## 2019-09-07 DIAGNOSIS — C439 Malignant melanoma of skin, unspecified: Secondary | ICD-10-CM

## 2019-09-07 NOTE — Progress Notes (Signed)
Hematology and Oncology Follow Up Visit  Kaitlyn Duncan [id number] Aug 17, 1970 49 y.o. 09/07/2019 3:19 PM Kaitlyn Duncan, NPStrup, Kaitlyn Bears, NP   Principle Diagnosis: 49 year old woman with stage IV melanoma with involvement of the lymphadenopathy peritoneal disease and brain involvement diagnosed in July 2019.  Marland Kitchen   Prior Therapy: He is status post subcutaneous nodule biopsy on 01/27/2018 which confirmed the presence of metastatic melanoma. She status post endoscopy on 01/28/2018 which showed subcutaneous involvement that consistent with metastatic melanoma.  Whole brain radiation for total of 30 Gy in 10 fractions started on 02/07/2018.   Mektovi 45 mg bid, [name] 450 mg daily started in August 2019.  Therapy discontinued in November 2019 after partial response.   Pembrolizumab 200 mg every 3 weeks started on 06/05/2018.  She is status post 12 cycles completed in July 2020.    Current therapy: Active surveillance.    Interim History: Kaitlyn Duncan returns today for a repeat evaluation.  Since the last visit, she reports no major changes in her health.  Continues to feel well without any recent complaints.  She denies any recent hospitalization or illnesses.  She denies any lymphadenopathy or petechiae.  She continues to have skin irritation at times but no masses or subcutaneous nodules.            .     Medications: Reviewed without any changes. Current Outpatient Medications  Medication Sig Dispense Refill  . acetaminophen (TYLENOL) 325 MG tablet Take 650 mg by mouth every 6 (six) hours as needed.    . cetirizine (ZYRTEC) 10 MG tablet Take 10 mg by mouth as needed for allergies (for seasonal allergies).    . cimetidine (TAGAMET) 400 MG tablet Take 1 tablet (400 mg total) by mouth 2 (two) times daily. 60 tablet 5  . ferrous sulfate 325 (65 FE) MG EC tablet Take 1 tablet (325 mg total) by mouth 2 (two) times daily. (Patient not taking: Reported on 10/30/2018) 90 tablet 3   . fluticasone (FLONASE) 50 MCG/ACT nasal spray Place 1 spray into both nostrils daily. 9.9 g 2  . Multiple Vitamin (MULTIVITAMIN WITH MINERALS) TABS tablet Take 1 tablet by mouth daily.    . mupirocin ointment (BACTROBAN) 2 % APPLY INTO THE NOSE AS DIRECTED TWICE DAILY 22 g 1  . Sennosides (LAXATIVE PILLS) 15 MG TABS Take 15 mg by mouth daily.    . solifenacin (VESICARE) 5 MG tablet TAKE 1 TABLET (5 MG TOTAL) BY MOUTH DAILY. 30 tablet 5   No current facility-administered medications for this visit.     Allergies: No Known Allergies  .     Physical Exam:   Blood pressure 136/75, pulse 78, temperature 98.3 F (36.8 C), temperature source Temporal, resp. rate 16, height '5\' 1"'$  (1.549 m), weight 138 lb 8 oz (62.8 kg), SpO2 100 %.     ECOG: 1    General appearance: Comfortable appearing without any discomfort Head: Normocephalic without any trauma Oropharynx: Mucous membranes are moist and pink without any thrush or ulcers. Eyes: Pupils are equal and round reactive to light. Lymph nodes: No cervical, supraclavicular, inguinal or axillary lymphadenopathy.   Heart:regular rate and rhythm.  S1 and S2 without leg edema. Lung: Clear without any rhonchi or wheezes.  No dullness to percussion. Abdomin: Soft, nontender, nondistended with good bowel sounds.  No hepatosplenomegaly. Musculoskeletal: No joint deformity or effusion.  Full range of motion noted. Neurological: No deficits noted on motor, sensory and deep tendon reflex exam. Skin: No petechial rash  or dryness.  Appeared moist.                  Lab Results: Lab Results  Component Value Date   WBC 4.0 09/04/2019   HGB 13.2 09/04/2019   HCT 41.2 09/04/2019   MCV 83.7 09/04/2019   PLT 74 (L) 09/04/2019     Chemistry      Component Value Date/Time   NA 141 09/04/2019 1225   K 3.7 09/04/2019 1225   CL 108 09/04/2019 1225   CO2 21 (L) 09/04/2019 1225   BUN 17 09/04/2019 1225   CREATININE 0.82 09/04/2019  1225      Component Value Date/Time   CALCIUM 9.2 09/04/2019 1225   ALKPHOS 85 09/04/2019 1225   AST 19 09/04/2019 1225   ALT 10 09/04/2019 1225   BILITOT 0.4 09/04/2019 1225     IMPRESSION: 1. No significant change in post treatment appearance of extensive metastatic disease throughout the chest, abdomen, or pelvis, including pulmonary nodules, subcutaneous soft tissue nodules, peritoneal and retroperitoneal nodules, gallbladder mass, and bilateral adrenal nodules. 2. No evidence of new metastatic disease in the chest, abdomen, or pelvis. 3. Stable small hyperenhancing focus of the caudate lobe of the liver, possibly a flash filling hemangioma. Attention on follow-up. 4. Stable small hyperenhancing lesions of the spleen, again possibly hemangiomata. Attention on follow-up. 5. Interval resolution of a previously seen fluid attenuation lesion in the vicinity of the left ovary or adnexa, consistent with resolution of a functional cyst or follicle. 6. Uterine fibroids.  49 year old woman with:  1.  Melanoma of unknown primary diagnosed in July 2019.  He had stage IV disease with peritoneal and lymph node involvement.   He is currently on active surveillance without any evidence of progression of disease based on imaging studies obtained on March 2 of 2021.  CT scan results were reviewed personally and discussed with the patient at this time.  Treatment options at this time were reviewed which include continued active surveillance versus restarting treatment with immunotherapy or BRAF targeted therapy.  She opted to continue with active surveillance at this time given her overall stable disease which is a reasonable approach at this time.  2.  CNS metastasis: She is on active surveillance at this time with last MRI obtained in November 2020.  No signs or symptoms of disease progression.   3.  Prognosis: Therapy remains palliative at this time although aggressive measures are  warranted given her excellent performance status.   4.  Psychosocial consideration: No recent issues reported at this time.    5.  Immune mediated complications: She is not experiencing any delayed complications at this time.  Continue to address these and educate her about potential issues in the future.  6.  Anemia: Resolved at this time with normalization of her hemoglobin.  7.  Thrombocytopenia: Worsening at this time since the last visit: No active bleeding.  Could be autoimmune in nature we will continue to monitor.  8.  Follow-up: I recommended repeat imaging studies in 4 months.  30  minutes were spent on this encounter.  The time was dedicated to reviewing her laboratory data, CT scan images, discussing treatment options and future plan of care.   Zola Button, MD 3/5/20213:19 PM

## 2019-09-10 ENCOUNTER — Telehealth: Payer: Self-pay | Admitting: Oncology

## 2019-09-10 NOTE — Telephone Encounter (Signed)
Scheduled appt per 3/5 los.  Sent a message to HIM pool to get a calendar mailed out. 

## 2019-09-13 ENCOUNTER — Telehealth: Payer: Self-pay

## 2019-09-13 NOTE — Telephone Encounter (Signed)
Patient left a voicemail on Tuesday after 4 pm asking for a return call. Called patient back first thing Wednesday morning and got her voicemail. Left a message for patient to return call to [phone]. Patient left another voicemail on Wednesday 09/12/19 around 3:30 pm requesting a return call.  Called patient back at 3:45 and got her voicemail again. Left another message for patient. Also attempted to contact patient Thursday am 09/13/19 with no answer. Left another message on her voicemail. Attempted to contact patient each time at the number she left on voicemail [phone].

## 2019-09-14 ENCOUNTER — Telehealth: Payer: Self-pay

## 2019-09-14 NOTE — Telephone Encounter (Signed)
Received voicemail from patient requesting her last office visit note to be sent to her dermatologist at [facility] at [place]. Last office note faxed to [phone]. Confirmation received.

## 2019-09-17 MED FILL — CIMETIDINE 400 MG TABLET: 400 | 30 days supply | Qty: 60 | Fill #2

## 2019-09-17 MED FILL — SOLIFENACIN SUCCINATE 5 MG: 5 | 30 days supply | Qty: 30 | Fill #3

## 2019-09-18 ENCOUNTER — Telehealth: Payer: Self-pay | Admitting: Radiation Oncology

## 2019-09-18 NOTE — Telephone Encounter (Signed)
Attempted to reach patient via phone several times today but each time I reached only her voicemail. The last call I made to her today I requested she leave a detailed message of her needs should we not connect so I can facilitate a faster response.    Received voicemail message from patient explaining that she has periodic pain in the back left side of her head making her feel like she "needs to get an MRI sooner rather than later." Also, patient explained over voicemail that she is struggling with focus, concentration and memory. Patient requested Ashlyn prescribe medication to assist with these neurologic changes like "maybe some that I have seen on TV."   Patients voicemail was scattered and repetative. Phoned patient back for additional information but reached her voicemail again.   Forwarding these events onto [employer], PA-C and [employer].

## 2019-09-18 NOTE — Telephone Encounter (Signed)
Attempted to reach patient via phone a second time to inquire about need. No answer. Left voicemail with my direct number requesting a return call. Awaiting patient's call.

## 2019-09-18 NOTE — Telephone Encounter (Signed)
I would support repeating an MRI sooner than May based on her symptoms.  If she is resistant to that, I would recommend getting her in for a consult with Dr. Mickeal Skinner for evaluation of the neurologic symptoms and recommendations for management.  Nicholos Johns, MMS, PA-C [facility] [place]: [phone]  Fax: [phone]

## 2019-09-18 NOTE — Telephone Encounter (Signed)
Received voicemail message from patient requesting return call. Phoned back promptly. No answer. Left voicemail with my direct number.

## 2019-09-19 ENCOUNTER — Telehealth: Payer: Self-pay | Admitting: Radiation Therapy

## 2019-09-19 NOTE — Telephone Encounter (Signed)
Perfect!  Thank you, Manuela Schwartz.

## 2019-09-19 NOTE — Telephone Encounter (Signed)
Called in reference to the patient's request to be seen sooner than her May appointment to address headaches she has been having. I was not able to reach her on the phone, so I left a detailed message about an appointment scheduled for her to see Dr. Mickeal Skinner Friday 3/19, with my contact information to call back with any questions.   Mont Dutton R.T.(R)(T) Radiation Special Procedures Navigator

## 2019-09-21 ENCOUNTER — Other Ambulatory Visit: Payer: Self-pay

## 2019-09-21 ENCOUNTER — Inpatient Hospital Stay (HOSPITAL_BASED_OUTPATIENT_CLINIC_OR_DEPARTMENT_OTHER): Payer: Medicaid Other | Admitting: Internal Medicine

## 2019-09-21 VITALS — BP 131/72 | HR 64 | Temp 98.0°F | Resp 17 | Ht 61.0 in | Wt 135.1 lb

## 2019-09-21 DIAGNOSIS — C439 Malignant melanoma of skin, unspecified: Secondary | ICD-10-CM | POA: Diagnosis not present

## 2019-09-21 DIAGNOSIS — C7931 Secondary malignant neoplasm of brain: Secondary | ICD-10-CM | POA: Diagnosis not present

## 2019-09-21 NOTE — Progress Notes (Signed)
[facility] [place],  [address] [phone]   Interval Evaluation  Date of Service: 09/21/19 Patient Name: Kaitlyn Duncan Patient MRN: [id number] Patient DOB: March 27, 1971 Provider: Ventura Sellers, MD  Identifying Statement:  [name] is a 49 y.o. female with Brain metastasis ([place]) [C79.31]     Primary Cancer:  Oncologic History: Oncology History  Melanoma of skin ([place])  05/29/2018 Initial Diagnosis   Melanoma of skin ([place])   06/05/2018 -  Chemotherapy   The patient had pembrolizumab (KEYTRUDA) 200 mg in sodium chloride 0.9 % 50 mL chemo infusion, 200 mg, Intravenous, Once, 12 of 12 cycles Administration: 200 mg (06/05/2018), 200 mg (06/26/2018), 200 mg (07/17/2018), 200 mg (08/07/2018), 200 mg (08/28/2018), 200 mg (09/18/2018), 200 mg (10/09/2018), 200 mg (10/30/2018), 200 mg (11/20/2018), 200 mg (12/11/2018), 200 mg (01/01/2019), 200 mg (01/22/2019)  for chemotherapy treatment.      Interval History:  Kaitlyn Duncan presents today for follow up.  Today she describes new onset left sided head pain, described as "some kind of pain" which lasts for several minutes, doesn't radiate, isn't associated with nausea and vomiting.  There is no photophobia, positional or time of day predilection.  There is no pain with straining or bearing down.  Frequency is "once a week maybe".  There is no associated neurologic deficit.  She also describes more chronic feeling of impaired short term memory, "slow thinking".  She remains positive and energetic overall, maintains physical and mental activity.  H+P (09/06/18) Patient presents today due to her concern over her anti-epileptic drug regimen.  She has been taking Keppra '1000mg'$  twice per day since July 2019, when she was initially diagnosed with diffuse brain metastases secondary to melanoma.  She denies ever experiencing a seizure.  Her gait has been normal/steady, she has mild  tension headaches on occasion only.  There is no history of epilepsy in the family, no prior neurotrauma.  Melanoma has been managed for ~1 year with keytruda monotherapy.  Medications: Current Outpatient Medications on File Prior to Visit  Medication Sig Dispense Refill  . acetaminophen (TYLENOL) 325 MG tablet Take 650 mg by mouth every 6 (six) hours as needed.    . cetirizine (ZYRTEC) 10 MG tablet Take 10 mg by mouth as needed for allergies (for seasonal allergies).    . cimetidine (TAGAMET) 400 MG tablet Take 1 tablet (400 mg total) by mouth 2 (two) times daily. 60 tablet 5  . ferrous sulfate 325 (65 FE) MG EC tablet Take 1 tablet (325 mg total) by mouth 2 (two) times daily. (Patient not taking: Reported on 10/30/2018) 90 tablet 3  . fluticasone (FLONASE) 50 MCG/ACT nasal spray Place 1 spray into both nostrils daily. 9.9 g 2  . Multiple Vitamin (MULTIVITAMIN WITH MINERALS) TABS tablet Take 1 tablet by mouth daily.    . mupirocin ointment (BACTROBAN) 2 % APPLY INTO THE NOSE AS DIRECTED TWICE DAILY 22 g 1  . Sennosides (LAXATIVE PILLS) 15 MG TABS Take 15 mg by mouth daily.    . solifenacin (VESICARE) 5 MG tablet TAKE 1 TABLET (5 MG TOTAL) BY MOUTH DAILY. 30 tablet 5   No current facility-administered medications on file prior to visit.    Allergies: No Known Allergies Past Medical History:  Past Medical History:  Diagnosis Date  . Anemia   . Anemia   . Diabetes mellitus without complication ([place])    prediabetic  . melanoma with met  dz dx'd 01/2018   brain and adrenal  . Seasonal allergies    Past Surgical History:  Past Surgical History:  Procedure Laterality Date  . BIOPSY  01/28/2018   Procedure: BIOPSY;  Surgeon: Laurence Spates, MD;  Location: [facility];  Service: Endoscopy;;  . BREAST BIOPSY Left   . ESOPHAGOGASTRODUODENOSCOPY N/A 01/28/2018   Procedure: ESOPHAGOGASTRODUODENOSCOPY (EGD);  Surgeon: Laurence Spates, MD;  Location: [facility];  Service: Endoscopy;  Laterality:  N/A;  . MOUTH SURGERY    . OVARY SURGERY     removed "something"   Social History:  Social History   Socioeconomic History  . Marital status: Single    Spouse name: Not on file  . Number of children: Not on file  . Years of education: Not on file  . Highest education level: Not on file  Occupational History  . Not on file  Tobacco Use  . Smoking status: Never Smoker  . Smokeless tobacco: Never Used  Substance and Sexual Activity  . Alcohol use: No  . Drug use: No  . Sexual activity: Not Currently  Other Topics Concern  . Not on file  Social History Narrative  . Not on file   Social Determinants of Health   Financial Resource Strain:   . Difficulty of Paying Living Expenses:   Food Insecurity:   . Worried About Charity fundraiser in the Last Year:   . Arboriculturist in the Last Year:   Transportation Needs:   . Film/video editor (Medical):   Marland Kitchen Lack of Transportation (Non-Medical):   Physical Activity:   . Days of Exercise per Week:   . Minutes of Exercise per Session:   Stress:   . Feeling of Stress :   Social Connections:   . Frequency of Communication with Friends and Family:   . Frequency of Social Gatherings with Friends and Family:   . Attends Religious Services:   . Active Member of Clubs or Organizations:   . Attends Archivist Meetings:   Marland Kitchen Marital Status:   Intimate Partner Violence:   . Fear of Current or Ex-Partner:   . Emotionally Abused:   Marland Kitchen Physically Abused:   . Sexually Abused:    Family History:  Family History  Problem Relation Age of Onset  . Hypertension Father   . Breast cancer Maternal Aunt   . Diabetes Maternal Aunt   . Breast cancer Paternal Aunt   . Breast cancer Maternal Grandmother   . Breast cancer Paternal Grandmother   . Diabetes Paternal Grandmother     Review of Systems: Constitutional: Denies fevers, chills or abnormal weight loss Eyes: Denies blurriness of vision Ears, nose, mouth, throat, and  face: Denies mucositis or sore throat Respiratory: Denies cough, dyspnea or wheezes Cardiovascular: Denies palpitation, chest discomfort or lower extremity swelling Gastrointestinal:  Denies nausea, constipation, diarrhea GU: Denies dysuria or incontinence Skin: Denies abnormal skin rashes Neurological: Per HPI Musculoskeletal: Denies joint pain, back or neck discomfort. No decrease in ROM Behavioral/Psych: Denies anxiety, disturbance in thought content, and mood instability   Physical Exam: There were no vitals filed for this visit. KPS: 90. General: Alert, cooperative, pleasant, in no acute distress Head: Craniotomy scar noted, dry and intact. EENT: No conjunctival injection or scleral icterus. Oral mucosa moist Lungs: Resp effort normal Cardiac: Regular rate and rhythm Abdomen: Soft, non-distended abdomen Skin: No rashes cyanosis or petechiae. Extremities: No clubbing or edema  Neurologic Exam: Mental Status: Awake, alert, attentive to examiner.  Oriented to self and environment. Language is fluent with intact comprehension. Age advanced psychomotor slowing, delayed object recall Cranial Nerves: Visual acuity is grossly normal. Visual fields are full. Extra-ocular movements intact. No ptosis. Face is symmetric, tongue midline. Motor: Tone and bulk are normal. Power is full in both arms and legs. Reflexes are symmetric, no pathologic reflexes present. Intact finger to nose bilaterally Sensory: Intact to light touch and temperature Gait: Normal and tandem gait is normal.   Labs: I have reviewed the data as listed    Component Value Date/Time   NA 141 09/04/2019 1225   K 3.7 09/04/2019 1225   CL 108 09/04/2019 1225   CO2 21 (L) 09/04/2019 1225   GLUCOSE 132 (H) 09/04/2019 1225   BUN 17 09/04/2019 1225   CREATININE 0.82 09/04/2019 1225   CALCIUM 9.2 09/04/2019 1225   PROT 7.4 09/04/2019 1225   ALBUMIN 4.2 09/04/2019 1225   AST 19 09/04/2019 1225   ALT 10 09/04/2019 1225    ALKPHOS 85 09/04/2019 1225   BILITOT 0.4 09/04/2019 1225   GFRNONAA >60 09/04/2019 1225   GFRAA >60 09/04/2019 1225   Lab Results  Component Value Date   WBC 4.0 09/04/2019   NEUTROABS 2.5 09/04/2019   HGB 13.2 09/04/2019   HCT 41.2 09/04/2019   MCV 83.7 09/04/2019   PLT 74 (L) 09/04/2019    Imaging:  CT Chest W Contrast  Result Date: 09/05/2019 CLINICAL DATA:  Metastatic melanoma EXAM: CT CHEST, ABDOMEN, AND PELVIS WITH CONTRAST TECHNIQUE: Multidetector CT imaging of the chest, abdomen and pelvis was performed following the standard protocol during bolus administration of intravenous contrast. CONTRAST:  157m OMNIPAQUE IOHEXOL 300 MG/ML SOLN, additional oral enteric contrast COMPARISON:  06/05/2019, 03/02/2019, 01/26/2018 FINDINGS: CT CHEST FINDINGS Cardiovascular: No significant vascular findings. Normal heart size. No pericardial effusion. Mediastinum/Nodes: No enlarged mediastinal, hilar, or axillary lymph nodes. Thyroid gland, trachea, and esophagus demonstrate no significant findings. Lungs/Pleura: Benign post infectious or inflammatory pneumatocele of the superior segment left lower lobe. Stable post treatment appearance of pulmonary metastases, for example a 2 mm nodule of the right pulmonary apex (series 6, image 25) and subtle irregular opacities of the right upper lobe (series 6, image 68). No new nodules. No pleural effusion or pneumothorax. Musculoskeletal: Numerous tiny subcutaneous soft tissue nodules are unchanged. Benign, trabeculated vertebral body hemangioma of T12. CT ABDOMEN PELVIS FINDINGS Hepatobiliary: Unchanged small hyperenhancing subcapsular focus of the caudate measuring 8 mm (series 2, image 49). No significant change in a contrast enhancing soft tissue mass of the gallbladder fundus measuring approximately 2.8 cm (series 2, image 55). Pancreas: Unremarkable. No pancreatic ductal dilatation or surrounding inflammatory changes. Spleen: No significant change in numerous  small hyperenhancing lesions of the spleen measuring up to 8 mm (series 2, image 53). Adrenals/Urinary Tract: Redemonstrated bilateral adrenal gland metastases measuring 2.2 x 1.5 cm on the right and 2.1 x 1.5 cm on the left, not significantly changed (series 2, image 55, 56). Kidneys are normal, without renal calculi, solid lesion, or hydronephrosis. Bladder is unremarkable. Stomach/Bowel: Stomach is within normal limits. Appendix appears normal. No evidence of bowel wall thickening, distention, or inflammatory changes. Vascular/Lymphatic: No significant vascular findings are present. No enlarged abdominal or pelvic lymph nodes. Reproductive: Uterine fibroids. Interval resolution of a previously seen fluid attenuation lesion in the vicinity of the left ovary or adnexa, consistent with resolution of a functional cyst or follicle. Unchanged fluid attenuation cyst of the right ovary. Other: No abdominal wall hernia or abnormality.  Unchanged post treatment appearance of numerous tiny peritoneal and retroperitoneal metastatic nodules. No abdominopelvic ascites. Musculoskeletal: Unchanged densely ossified lesion of the right hemi sacrum (series 2, image 91). IMPRESSION: 1. No significant change in post treatment appearance of extensive metastatic disease throughout the chest, abdomen, or pelvis, including pulmonary nodules, subcutaneous soft tissue nodules, peritoneal and retroperitoneal nodules, gallbladder mass, and bilateral adrenal nodules. 2. No evidence of new metastatic disease in the chest, abdomen, or pelvis. 3. Stable small hyperenhancing focus of the caudate lobe of the liver, possibly a flash filling hemangioma. Attention on follow-up. 4. Stable small hyperenhancing lesions of the spleen, again possibly hemangiomata. Attention on follow-up. 5. Interval resolution of a previously seen fluid attenuation lesion in the vicinity of the left ovary or adnexa, consistent with resolution of a functional cyst or  follicle. 6. Uterine fibroids. Electronically Signed   By: Eddie Candle M.D.   On: 09/05/2019 09:37   CT Abdomen Pelvis W Contrast  Result Date: 09/05/2019 CLINICAL DATA:  Metastatic melanoma EXAM: CT CHEST, ABDOMEN, AND PELVIS WITH CONTRAST TECHNIQUE: Multidetector CT imaging of the chest, abdomen and pelvis was performed following the standard protocol during bolus administration of intravenous contrast. CONTRAST:  160m OMNIPAQUE IOHEXOL 300 MG/ML SOLN, additional oral enteric contrast COMPARISON:  06/05/2019, 03/02/2019, 01/26/2018 FINDINGS: CT CHEST FINDINGS Cardiovascular: No significant vascular findings. Normal heart size. No pericardial effusion. Mediastinum/Nodes: No enlarged mediastinal, hilar, or axillary lymph nodes. Thyroid gland, trachea, and esophagus demonstrate no significant findings. Lungs/Pleura: Benign post infectious or inflammatory pneumatocele of the superior segment left lower lobe. Stable post treatment appearance of pulmonary metastases, for example a 2 mm nodule of the right pulmonary apex (series 6, image 25) and subtle irregular opacities of the right upper lobe (series 6, image 68). No new nodules. No pleural effusion or pneumothorax. Musculoskeletal: Numerous tiny subcutaneous soft tissue nodules are unchanged. Benign, trabeculated vertebral body hemangioma of T12. CT ABDOMEN PELVIS FINDINGS Hepatobiliary: Unchanged small hyperenhancing subcapsular focus of the caudate measuring 8 mm (series 2, image 49). No significant change in a contrast enhancing soft tissue mass of the gallbladder fundus measuring approximately 2.8 cm (series 2, image 55). Pancreas: Unremarkable. No pancreatic ductal dilatation or surrounding inflammatory changes. Spleen: No significant change in numerous small hyperenhancing lesions of the spleen measuring up to 8 mm (series 2, image 53). Adrenals/Urinary Tract: Redemonstrated bilateral adrenal gland metastases measuring 2.2 x 1.5 cm on the right and 2.1 x  1.5 cm on the left, not significantly changed (series 2, image 55, 56). Kidneys are normal, without renal calculi, solid lesion, or hydronephrosis. Bladder is unremarkable. Stomach/Bowel: Stomach is within normal limits. Appendix appears normal. No evidence of bowel wall thickening, distention, or inflammatory changes. Vascular/Lymphatic: No significant vascular findings are present. No enlarged abdominal or pelvic lymph nodes. Reproductive: Uterine fibroids. Interval resolution of a previously seen fluid attenuation lesion in the vicinity of the left ovary or adnexa, consistent with resolution of a functional cyst or follicle. Unchanged fluid attenuation cyst of the right ovary. Other: No abdominal wall hernia or abnormality. Unchanged post treatment appearance of numerous tiny peritoneal and retroperitoneal metastatic nodules. No abdominopelvic ascites. Musculoskeletal: Unchanged densely ossified lesion of the right hemi sacrum (series 2, image 91). IMPRESSION: 1. No significant change in post treatment appearance of extensive metastatic disease throughout the chest, abdomen, or pelvis, including pulmonary nodules, subcutaneous soft tissue nodules, peritoneal and retroperitoneal nodules, gallbladder mass, and bilateral adrenal nodules. 2. No evidence of new metastatic disease in the chest, abdomen, or  pelvis. 3. Stable small hyperenhancing focus of the caudate lobe of the liver, possibly a flash filling hemangioma. Attention on follow-up. 4. Stable small hyperenhancing lesions of the spleen, again possibly hemangiomata. Attention on follow-up. 5. Interval resolution of a previously seen fluid attenuation lesion in the vicinity of the left ovary or adnexa, consistent with resolution of a functional cyst or follicle. 6. Uterine fibroids. Electronically Signed   By: Eddie Candle M.D.   On: 09/05/2019 09:37   US BREAST LTD UNI RIGHT INC AXILLA  Result Date: 08/22/2019 CLINICAL DATA:  Short-term interval follow-up  of probable benign findings in the right breast. EXAM: DIGITAL DIAGNOSTIC RIGHT MAMMOGRAM WITH CAD AND TOMO ULTRASOUND RIGHT BREAST COMPARISON:  Previous exam(s). ACR Breast Density Category c: The breast tissue is heterogeneously dense, which may obscure small masses. FINDINGS: No suspicious mass, malignant type microcalcifications or distortion detected in the right breast. Previously described circumscribed mass in the superior portion of the breast is less conspicuous. Mammographic images were processed with CAD. Targeted ultrasound is performed, showing a circumscribed hypoechoic mass in the right breast at 10 o'clock 6 cm from the nipple measuring 5 x 3 x 5 mm. Previously it measured 5 x 3 x 6 mm. There is a second hypoechoic mass at 10 o'clock 8 cm from the nipple measuring 3 x 2 x 3 mm. Previously it measured 4 x 2 x 4 mm. IMPRESSION: Probable benign masses in the right breast. RECOMMENDATION: Bilateral diagnostic mammogram and right breast ultrasound in 6 months is recommended. I have discussed the findings and recommendations with the patient. If applicable, a reminder letter will be sent to the patient regarding the next appointment. BI-RADS CATEGORY  3: Probably benign. Electronically Signed   By: Lillia Mountain M.D.   On: 08/22/2019 13:58   MM DIAG BREAST TOMO UNI RIGHT  Result Date: 08/22/2019 CLINICAL DATA:  Short-term interval follow-up of probable benign findings in the right breast. EXAM: DIGITAL DIAGNOSTIC RIGHT MAMMOGRAM WITH CAD AND TOMO ULTRASOUND RIGHT BREAST COMPARISON:  Previous exam(s). ACR Breast Density Category c: The breast tissue is heterogeneously dense, which may obscure small masses. FINDINGS: No suspicious mass, malignant type microcalcifications or distortion detected in the right breast. Previously described circumscribed mass in the superior portion of the breast is less conspicuous. Mammographic images were processed with CAD. Targeted ultrasound is performed, showing a  circumscribed hypoechoic mass in the right breast at 10 o'clock 6 cm from the nipple measuring 5 x 3 x 5 mm. Previously it measured 5 x 3 x 6 mm. There is a second hypoechoic mass at 10 o'clock 8 cm from the nipple measuring 3 x 2 x 3 mm. Previously it measured 4 x 2 x 4 mm. IMPRESSION: Probable benign masses in the right breast. RECOMMENDATION: Bilateral diagnostic mammogram and right breast ultrasound in 6 months is recommended. I have discussed the findings and recommendations with the patient. If applicable, a reminder letter will be sent to the patient regarding the next appointment. BI-RADS CATEGORY  3: Probably benign. Electronically Signed   By: Lillia Mountain M.D.   On: 08/22/2019 13:58    [facility] Clinician Interpretation: I have personally reviewed the radiological images as listed.  My interpretation, in the context of the patient's clinical presentation, is stable disease   Assessment/Plan 1. Brain metastasis ([place])  Ms. 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  Headaches are not c/w increased intracranial pressure or neurologic syndrome.  We counseled her on  cognitive decline related to radiation, and encouraged her to maintain physical and mental activity, as well as positive health outlook.  Headaches may be managed as needed with NSAIDs or Tylenol.    Should follow up after next MRI brain in 4-6 weeks.  We appreciate the opportunity to participate in the care of [facility].   All questions were answered. The patient knows to call the clinic with any problems, questions or concerns. No barriers to learning were detected.  The total time spent in the encounter was 30 minutes and more than 50% was on counseling and review of test results   Ventura Sellers, MD Medical Director of Neuro-Oncology [facility] at [place] 09/21/19 11:24 AM

## 2019-10-01 ENCOUNTER — Telehealth: Payer: Self-pay | Admitting: Internal Medicine

## 2019-10-01 ENCOUNTER — Telehealth: Payer: Self-pay | Admitting: *Deleted

## 2019-10-01 NOTE — Telephone Encounter (Signed)
Scheduled apt per 3/26 sch message - unable to reach pt . Left message with appt date and time

## 2019-10-01 NOTE — Telephone Encounter (Signed)
Patient called to question why she had a follow up with Dr. Mickeal Skinner in may.  Attempted to call her back to explain.  Left message pending call back.

## 2019-10-10 ENCOUNTER — Telehealth: Payer: Self-pay

## 2019-10-10 NOTE — Telephone Encounter (Signed)
-----   Message from Wyatt Portela, MD sent at 10/10/2019  1:44 PM EDT ----- I do not think this pain is related to her cancer based on the last CT scan.  She experiences some more pain she can go see her PCP or emergency department in the future.  Thanks ----- Message ----- From: Tami Lin, RN Sent: 10/10/2019   1:20 PM EDT To: Wyatt Portela, MD  Patient called to report "extreme pain last night just below the breastbone that lasted for about an hour." She vomited once and took Tagamet and the pain eased off. Patient states she experienced the pain again this morning and the pain eventually went away after taking Gas X. She stated this is the first time she has experienced pain like this and was close to calling 911 last night. She denies eating anything different but said she has experienced a lot of indigestion recently.  Patient states she feels fine now but wanted to make you aware "in case there was something on the last scan that could be causing the pain". I asked her if she contacted her PCP today and she said her PCP is on maternity leave. I asked her if anyone was covering for her PCP and she was unsure.  Lanelle Bal

## 2019-10-10 NOTE — Telephone Encounter (Signed)
Called patient and made her aware of Dr. Hazeline Junker response below. Patient verbalized understanding of instructions.

## 2019-10-22 MED FILL — CIMETIDINE 400 MG TABLET: 400 | 30 days supply | Qty: 60 | Fill #3

## 2019-10-31 MED FILL — SOLIFENACIN SUCCINATE 5 MG: 5 | 30 days supply | Qty: 30 | Fill #4

## 2019-11-15 ENCOUNTER — Other Ambulatory Visit: Payer: Self-pay

## 2019-11-15 ENCOUNTER — Ambulatory Visit
Admission: RE | Admit: 2019-11-15 | Discharge: 2019-11-15 | Disposition: A | Discharge status: Home / Self Care | Payer: Medicaid Other | Source: Ambulatory Visit | Attending: Radiation Oncology | Admitting: Radiation Oncology

## 2019-11-15 DIAGNOSIS — C7931 Secondary malignant neoplasm of brain: Secondary | ICD-10-CM

## 2019-11-15 MED ORDER — GADOBENATE DIMEGLUMINE 529 MG/ML IV SOLN
14.0000 mL | Freq: Once | INTRAVENOUS | Status: AC | PRN
  Administered 2019-11-15: 14 mL via INTRAVENOUS

## 2019-11-20 ENCOUNTER — Other Ambulatory Visit: Payer: Self-pay

## 2019-11-20 ENCOUNTER — Inpatient Hospital Stay: Payer: Medicaid Other | Attending: Obstetrics and Gynecology | Admitting: Internal Medicine

## 2019-11-20 VITALS — BP 127/86 | HR 73 | Temp 98.5°F | Resp 18 | Ht 61.0 in | Wt 134.5 lb

## 2019-11-20 DIAGNOSIS — C7931 Secondary malignant neoplasm of brain: Secondary | ICD-10-CM | POA: Diagnosis not present

## 2019-11-20 DIAGNOSIS — Z803 Family history of malignant neoplasm of breast: Secondary | ICD-10-CM | POA: Insufficient documentation

## 2019-11-20 DIAGNOSIS — C439 Malignant melanoma of skin, unspecified: Secondary | ICD-10-CM | POA: Insufficient documentation

## 2019-11-20 NOTE — Progress Notes (Signed)
[facility] [place], [place] [address] [phone]   Interval Evaluation  Date of Service: 11/20/19 Patient Name: Kaitlyn Duncan Patient MRN: [id number] Patient DOB: 1971-06-19 Provider: Ventura Sellers, MD  Identifying Statement:  Kaitlyn Duncan is a 49 y.o. female with Brain metastasis ([place]) [C79.31]     Primary Cancer:  Oncologic History: Oncology History  Melanoma of skin ([place])  05/29/2018 Initial Diagnosis   Melanoma of skin ([place])   06/05/2018 -  Chemotherapy   The patient had pembrolizumab (KEYTRUDA) 200 mg in sodium chloride 0.9 % 50 mL chemo infusion, 200 mg, Intravenous, Once, 12 of 12 cycles Administration: 200 mg (06/05/2018), 200 mg (06/26/2018), 200 mg (07/17/2018), 200 mg (08/07/2018), 200 mg (08/28/2018), 200 mg (09/18/2018), 200 mg (10/09/2018), 200 mg (10/30/2018), 200 mg (11/20/2018), 200 mg (12/11/2018), 200 mg (01/01/2019), 200 mg (01/22/2019)  for chemotherapy treatment.     CNS Oncologic History 02/17/18: Completes WBRT for innumerable metastases  Interval History:  [name] presents today for follow up. Headaches are greatly improved.  She still describes impaired short term memory, "slow thinking".  She remains positive and energetic overall, maintains physical and mental activity.  H+P (09/06/18) Patient presents today due to her concern over her anti-epileptic drug regimen.  She has been taking Keppra '1000mg'$  twice per day since July 2019, when she was initially diagnosed with diffuse brain metastases secondary to melanoma.  She denies ever experiencing a seizure.  Her gait has been normal/steady, she has mild tension headaches on occasion only.  There is no history of epilepsy in the family, no prior neurotrauma.  Melanoma has been managed for ~1 year with keytruda monotherapy.  Medications: Current Outpatient Medications on File Prior to Visit  Medication Sig Dispense Refill  .  Acetaminophen 500 MG capsule Take 1,000 mg by mouth every 6 (six) hours as needed.     . fluticasone (FLONASE) 50 MCG/ACT nasal spray Place 1 spray into both nostrils daily. 9.9 g 2  . Lactobacillus (PROBIOTIC ACIDOPHILUS PO) Take 2 Doses by mouth daily.    . Milk Thistle 175 MG CAPS Take 175 mg by mouth daily.    . mupirocin ointment (BACTROBAN) 2 % APPLY INTO THE NOSE AS DIRECTED TWICE DAILY 22 g 1  . cetirizine (ZYRTEC) 10 MG tablet Take 10 mg by mouth as needed for allergies (for seasonal allergies).    . cimetidine (TAGAMET) 400 MG tablet Take 1 tablet (400 mg total) by mouth 2 (two) times daily. 60 tablet 5  . COLLAGEN PO Take by mouth.    . ferrous sulfate 325 (65 FE) MG EC tablet Take 1 tablet (325 mg total) by mouth 2 (two) times daily. (Patient not taking: Reported on 10/30/2018) 90 tablet 3  . Multiple Vitamin (MULTIVITAMIN WITH MINERALS) TABS tablet Take 1 tablet by mouth daily.    . Sennosides (LAXATIVE PILLS) 15 MG TABS Take 15 mg by mouth daily.    . solifenacin (VESICARE) 5 MG tablet TAKE 1 TABLET (5 MG TOTAL) BY MOUTH DAILY. 30 tablet 5  . TURMERIC PO Take by mouth.     No current facility-administered medications on file prior to visit.    Allergies: No Known Allergies Past Medical History:  Past Medical History:  Diagnosis Date  . Anemia   . Anemia   . Diabetes mellitus without complication ([place])    prediabetic  . melanoma with met dz dx'd 01/2018   brain and adrenal  .  Seasonal allergies    Past Surgical History:  Past Surgical History:  Procedure Laterality Date  . BIOPSY  01/28/2018   Procedure: BIOPSY;  Surgeon: Laurence Spates, MD;  Location: [facility];  Service: Endoscopy;;  . BREAST BIOPSY Left   . ESOPHAGOGASTRODUODENOSCOPY N/A 01/28/2018   Procedure: ESOPHAGOGASTRODUODENOSCOPY (EGD);  Surgeon: Laurence Spates, MD;  Location: [facility];  Service: Endoscopy;  Laterality: N/A;  . MOUTH SURGERY    . OVARY SURGERY     removed "something"   Social  History:  Social History   Socioeconomic History  . Marital status: Single    Spouse name: Not on file  . Number of children: Not on file  . Years of education: Not on file  . Highest education level: Not on file  Occupational History  . Not on file  Tobacco Use  . Smoking status: Never Smoker  . Smokeless tobacco: Never Used  Substance and Sexual Activity  . Alcohol use: No  . Drug use: No  . Sexual activity: Not Currently  Other Topics Concern  . Not on file  Social History Narrative  . Not on file   Social Determinants of Health   Financial Resource Strain:   . Difficulty of Paying Living Expenses:   Food Insecurity:   . Worried About Charity fundraiser in the Last Year:   . Arboriculturist in the Last Year:   Transportation Needs:   . Film/video editor (Medical):   Marland Kitchen Lack of Transportation (Non-Medical):   Physical Activity:   . Days of Exercise per Week:   . Minutes of Exercise per Session:   Stress:   . Feeling of Stress :   Social Connections:   . Frequency of Communication with Friends and Family:   . Frequency of Social Gatherings with Friends and Family:   . Attends Religious Services:   . Active Member of Clubs or Organizations:   . Attends Archivist Meetings:   Marland Kitchen Marital Status:   Intimate Partner Violence:   . Fear of Current or Ex-Partner:   . Emotionally Abused:   Marland Kitchen Physically Abused:   . Sexually Abused:    Family History:  Family History  Problem Relation Age of Onset  . Hypertension Father   . Breast cancer Maternal Aunt   . Diabetes Maternal Aunt   . Breast cancer Paternal Aunt   . Breast cancer Maternal Grandmother   . Breast cancer Paternal Grandmother   . Diabetes Paternal Grandmother     Review of Systems: Constitutional: Denies fevers, chills or abnormal weight loss Eyes: Denies blurriness of vision Ears, nose, mouth, throat, and face: Denies mucositis or sore throat Respiratory: Denies cough, dyspnea or  wheezes Cardiovascular: Denies palpitation, chest discomfort or lower extremity swelling Gastrointestinal:  Denies nausea, constipation, diarrhea GU: Denies dysuria or incontinence Skin: Denies abnormal skin rashes Neurological: Per HPI Musculoskeletal: Denies joint pain, back or neck discomfort. No decrease in ROM Behavioral/Psych: Denies anxiety, disturbance in thought content, and mood instability   Physical Exam: Vitals:   11/20/19 1224  BP: 127/86  Pulse: 73  Resp: 18  Temp: 98.5 F (36.9 C)  SpO2: 99%   KPS: 90. General: Alert, cooperative, pleasant, in no acute distress Head: Craniotomy scar noted, dry and intact. EENT: No conjunctival injection or scleral icterus. Oral mucosa moist Lungs: Resp effort normal Cardiac: Regular rate and rhythm Abdomen: Soft, non-distended abdomen Skin: No rashes cyanosis or petechiae. Extremities: No clubbing or edema  Neurologic Exam:  Mental Status: Awake, alert, attentive to examiner. Oriented to self and environment. Language is fluent with intact comprehension. Age advanced psychomotor slowing, delayed object recall Cranial Nerves: Visual acuity is grossly normal. Visual fields are full. Extra-ocular movements intact. No ptosis. Face is symmetric, tongue midline. Motor: Tone and bulk are normal. Power is full in both arms and legs. Reflexes are symmetric, no pathologic reflexes present. Intact finger to nose bilaterally Sensory: Intact to light touch and temperature Gait: Normal and tandem gait is normal.   Labs: I have reviewed the data as listed    Component Value Date/Time   NA 141 09/04/2019 1225   K 3.7 09/04/2019 1225   CL 108 09/04/2019 1225   CO2 21 (L) 09/04/2019 1225   GLUCOSE 132 (H) 09/04/2019 1225   BUN 17 09/04/2019 1225   CREATININE 0.82 09/04/2019 1225   CALCIUM 9.2 09/04/2019 1225   PROT 7.4 09/04/2019 1225   ALBUMIN 4.2 09/04/2019 1225   AST 19 09/04/2019 1225   ALT 10 09/04/2019 1225   ALKPHOS 85  09/04/2019 1225   BILITOT 0.4 09/04/2019 1225   GFRNONAA >60 09/04/2019 1225   GFRAA >60 09/04/2019 1225   Lab Results  Component Value Date   WBC 4.0 09/04/2019   NEUTROABS 2.5 09/04/2019   HGB 13.2 09/04/2019   HCT 41.2 09/04/2019   MCV 83.7 09/04/2019   PLT 74 (L) 09/04/2019    Imaging:  MR Brain W Wo Contrast  Result Date: 11/16/2019 CLINICAL DATA:  Brain metastasis, SRS follow-up scan, surveillance. EXAM: MRI HEAD WITHOUT AND WITH CONTRAST TECHNIQUE: Multiplanar, multiecho pulse sequences of the brain and surrounding structures were obtained without and with intravenous contrast. CONTRAST:  20m MULTIHANCE GADOBENATE DIMEGLUMINE 529 MG/ML IV SOLN COMPARISON:  Brain MRI 05/16/2019 FINDINGS: Brain: Again demonstrated are numerous punctate and small lesions scattered throughout the brain demonstrating intrinsic T1 hyperintensity and SWI signal loss. As before, the largest lesions are present within the medial right thalamus (7 mm) (series 9, image 80) and right parietooccipital region (10 mm) (series 9, image 72). No definite new lesion is identified. No appreciable focal edema. Stable patchy and confluent T2/FLAIR hyperintensity throughout the cerebral white matter consistent with post radiation changes. There is no acute infarct. No extra-axial fluid collection. No midline shift. Vascular: Expected proximal arterial flow voids. Expected enhancement within the proximal large arterial vessels and dural venous sinuses. Skull and upper cervical spine: Normal marrow signal. Sinuses/Orbits: Visualized orbits show no acute finding. Mild ethmoid sinus mucosal thickening. Chronically opacified atelectatic left maxillary sinus. Persistent small left mastoid effusion. IMPRESSION: 1. Stable examination as compared to MRI 05/25/2019. Numerous small scattered treated brain metastases are unchanged. No new intracranial lesion is identified. 2. Postradiation changes throughout the cerebral white matter. 3.  Chronic paranasal sinus disease, most notably left maxillary. 4. Persistent small left mastoid effusion. Electronically Signed   By: KKellie SimmeringDO   On: 11/16/2019 08:58    CLas PiedrasClinician Interpretation: I have personally reviewed the radiological images as listed.  My interpretation, in the context of the patient's clinical presentation, is stable disease   Assessment/Plan 1. Brain metastasis ([place]  Ms. FRudais clinically and radiographically stable today.   We again counseled her on cognitive decline related to radiation, and encouraged her to maintain physical and mental activity, as well as positive health outlook.  Should follow up after next MRI brain in 4 months.  We appreciate [facility] opportunity [facility]  All questions were answered. The patient knows to call the clinic with any problems, questions or concerns. No barriers to learning were detected.  The total time spent in the encounter was 30 minutes and more than 50% was on counseling and review of test results   Ventura Sellers, MD Medical Director of Neuro-Oncology [facility] at [place] 11/20/19 12:32 PM

## 2019-11-21 ENCOUNTER — Ambulatory Visit
Admission: RE | Admit: 2019-11-21 | Discharge: 2019-11-21 | Disposition: A | Discharge status: Home / Self Care | Payer: Medicaid Other | Source: Ambulatory Visit | Attending: Urology | Admitting: Urology

## 2019-11-21 ENCOUNTER — Telehealth: Payer: Self-pay | Admitting: Internal Medicine

## 2019-11-21 ENCOUNTER — Encounter: Payer: Self-pay | Admitting: Urology

## 2019-11-21 ENCOUNTER — Other Ambulatory Visit: Payer: Self-pay

## 2019-11-21 DIAGNOSIS — C7931 Secondary malignant neoplasm of brain: Secondary | ICD-10-CM

## 2019-11-21 NOTE — Telephone Encounter (Signed)
Scheduled appt per 5/18 los.  Left a vm of the appt date and time.

## 2019-11-21 NOTE — Progress Notes (Signed)
Radiation Oncology         [phone]) [phone] ________________________________  Name: Kaitlyn Duncan MRN: [id number]  Date: 11/21/2019  DOB: [birth year]  Post Treatment Note  CC: [name], NP  [name] [name], NP  Diagnosis:   49 yo woman with over 86 brain metastases fromstage IVmelanoma  Interval Since Last Radiation:  1 year and 9 months 02/06/2018 to 02/17/2018:   The whole brain was treated to 30 Gy in 10 fractions of 3 Gy  Narrative:  I spoke with the patient to conduct her routine scheduled follow up visit and to discuss results from her recent follow up MRI via telephone to spare the patient unnecessary potential exposure in the healthcare setting during the current COVID-19 pandemic.  The patient was notified in advance and gave permission to proceed with this visit format. Since we saw her last in 05/2019, she developed some periodic pains in the back left side of her head along with increased difficulty with focus, concentration and memory, reported on 09/18/19.  She was evaluated with Dr. 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  Headaches were not felt to be c/w increased intracranial pressure or neurologic syndrome.  Her MRI brain scan from 11/15/19 shows a stable appearance of the previously treated numerous small metastatic deposits throughout the brain as compared to the previous study and NO new lesions.  Her case was reviewed in the multidisciplinary brain conference on 11/19/19 with recommendation to continue with 4 month surveillance scans. She met back with Dr. Mickeal Skinner on 11/20/19 and the plan is to repeat MRI brain in 4 months and follow up with him thereafter to review results and recommendations.  She completed 12 cycles of Pembrolizumab in 02/2019 and repeat systemic imaging on 03/02/19 for disease restaging showed a stable appearance of her disease with favorable  response to therapy though there was persistent bilateral adrenal metastases but no enlarging lymphadenopathy and no new metastatic lesions. Her most recent systemic imaging from 09/04/19 confirmed overall disease stability with no significant change in post treatment appearance of extensive metastatic disease throughout the chest, abdomen, or pelvis, including pulmonary nodules, subcutaneous soft tissue nodules, peritoneal and retroperitoneal nodules, gallbladder mass, and bilateral adrenal nodules. There was no evidence of new metastatic disease in the chest, abdomen, or Pelvis. At her most recent follow up visit with Dr. Alen Blew on 09/07/2019, the decision was to continue on observation only with plans for repeat imaging prior to her next scheduled follow up on 01/15/2020.  On review of systems, the patient states that she continues doing well overall and has continued to feel stronger since discontinuing her systemic therapy.  She has not had any significant headaches, tinnitus, dizziness/imbalance, tremor or seizure activity.  She denies chest pain, shortness of breath, wheezing, cough or hemoptysis.  She does have some impaired short term memory and difficulty with focus since completing radiation but this is not progressively worsening. She has remained off Keppra, initially prescribed as prophylactic, as per direction of Dr. Mickeal Skinner in 09/2018.  She denies any seizure activity since discontinuation. She reports improved appetite and is maintaining her weight.  She denies N/V, constipation or diarrhea.  She reports some improvement in the gas and indigestion since starting Tagamet which she continues to take BID.   She completed 12 cycles of Pembrolizumab in 02/2019 and continues in observation since that time. She has noted continued improvement, in fact, almost complete resolution  of the palpable scalp lesions which she is quite pleased with. She continues on Vesicare 71m po daily for management of her OAB and  this is helping significantly reduce her nocturia and she is getting better rest.  She tolerates this medication well.  ALLERGIES:  has No Known Allergies.  Meds: Current Outpatient Medications  Medication Sig Dispense Refill  . Acetaminophen 500 MG capsule Take 1,000 mg by mouth every 6 (six) hours as needed.     . cimetidine (TAGAMET) 400 MG tablet Take 1 tablet (400 mg total) by mouth 2 (two) times daily. 60 tablet 5  . COLLAGEN PO Take by mouth.    . fluticasone (FLONASE) 50 MCG/ACT nasal spray Place 1 spray into both nostrils daily. 9.9 g 2  . Lactobacillus (PROBIOTIC ACIDOPHILUS PO) Take 2 Doses by mouth daily.    .Marland Kitchenloratadine (CLARITIN) 10 MG tablet Take 10 mg by mouth daily.    . Milk Thistle 175 MG CAPS Take 175 mg by mouth daily.    . Multiple Vitamin (MULTIVITAMIN WITH MINERALS) TABS tablet Take 1 tablet by mouth daily.    . mupirocin ointment (BACTROBAN) 2 % APPLY INTO THE NOSE AS DIRECTED TWICE DAILY 22 g 1  . solifenacin (VESICARE) 5 MG tablet TAKE 1 TABLET (5 MG TOTAL) BY MOUTH DAILY. 30 tablet 5  . TURMERIC PO Take by mouth.     No current facility-administered medications for this encounter.    Physical Findings:  vitals were not taken for this visit.  Unable to assess due to telehealth visit format.  Lab Findings: Lab Results  Component Value Date   WBC 4.0 09/04/2019   HGB 13.2 09/04/2019   HCT 41.2 09/04/2019   MCV 83.7 09/04/2019   PLT 74 (L) 09/04/2019    Radiographic Findings: MR Brain W Wo Contrast  Result Date: 11/16/2019 CLINICAL DATA:  Brain metastasis, SRS follow-up scan, surveillance. EXAM: MRI HEAD WITHOUT AND WITH CONTRAST TECHNIQUE: Multiplanar, multiecho pulse sequences of the brain and surrounding structures were obtained without and with intravenous contrast. CONTRAST:  145mMULTIHANCE GADOBENATE DIMEGLUMINE 529 MG/ML IV SOLN COMPARISON:  Brain MRI 05/16/2019 FINDINGS: Brain: Again demonstrated are numerous punctate and small lesions scattered  throughout the brain demonstrating intrinsic T1 hyperintensity and SWI signal loss. As before, the largest lesions are present within the medial right thalamus (7 mm) (series 9, image 80) and right parietooccipital region (10 mm) (series 9, image 72). No definite new lesion is identified. No appreciable focal edema. Stable patchy and confluent T2/FLAIR hyperintensity throughout the cerebral white matter consistent with post radiation changes. There is no acute infarct. No extra-axial fluid collection. No midline shift. Vascular: Expected proximal arterial flow voids. Expected enhancement within the proximal large arterial vessels and dural venous sinuses. Skull and upper cervical spine: Normal marrow signal. Sinuses/Orbits: Visualized orbits show no acute finding. Mild ethmoid sinus mucosal thickening. Chronically opacified atelectatic left maxillary sinus. Persistent small left mastoid effusion. IMPRESSION: 1. Stable examination as compared to MRI 05/25/2019. Numerous small scattered treated brain metastases are unchanged. No new intracranial lesion is identified. 2. Postradiation changes throughout the cerebral white matter. 3. Chronic paranasal sinus disease, most notably left maxillary. 4. Persistent small left mastoid effusion. Electronically Signed   By: KyKellie SimmeringO   On: 11/16/2019 08:58    Impression/Plan: [id number] woman with over 50 brain metastases fromstage IVmelanoma. She has recovered well from the effects of her prior radiotherapy and remains clinically and radiographically stable. Her MRI brain scan  from 11/15/19 shows a stable appearance of the previously treated numerous small metastatic deposits throughout the brain as compared to the previous study and NO new lesions.  Her case was reviewed in the multidisciplinary brain conference on 11/19/19 with recommendation to continue with 4 month surveillance scans. She met with Dr. Mickeal Skinner on 11/20/19 and the plan is to repeat MRI brain in 4 months  and follow up with him thereafter to review results and recommendations. As per NCCN guidelines, we will continue to monitor closely with serial MRI brain scans every 4-6 months indefinitely. I have encouraged her to add some puzzles, word searches, cross-word puzzles, etc. to her daily routine to help improve her memory and focus.  I advised that there is really no need for her to continue follow up with [place] and Dr. Mickeal Skinner so going forward, she will see Dr. Mickeal Skinner for routine follow up and see [place] prn if there is any clinical indication for further radiotherapy. She will also continue in routine follow-up with Dr. Alen Blew for continued systemic disease management under his care and direction.  She is scheduled for repeat systemic imaging in 01/2020 prior to her scheduled follow-up visit with Dr. Alen Blew on 01/15/20 to determine whether she can continue in observation only or if there is a need to resume systemic therapy. She appears to have a good understanding of her disease and our recommendations and is in agreement with the stated plan.  She knows to call at any time in the interim with any questions or concerns.  2. Nocturia.  Improved on Vesicare 33m po qhs.  She has noticed improvement in her ability to get a good nights rest with taking this after supper which she plans to continue.   3. GERD. She reports the gas and indigestion has improved on Tagamet which she will continue taking BID.  I spent 25 minutes in telephone conversation with the patient and more than 50% of that time was spent in counseling and/or coordination of care.     ANicholos Johns PA-C

## 2019-11-22 ENCOUNTER — Other Ambulatory Visit: Payer: Self-pay | Admitting: Radiation Therapy

## 2019-11-29 MED FILL — CIMETIDINE 400 MG TABLET: 400 | 30 days supply | Qty: 60 | Fill #4

## 2019-12-05 MED FILL — SOLIFENACIN SUCCINATE 5 MG: 5 | 30 days supply | Qty: 30 | Fill #5

## 2019-12-22 ENCOUNTER — Emergency Department (HOSPITAL_COMMUNITY)
Admission: EM | Admit: 2019-12-22 | Discharge: 2019-12-22 | Disposition: A | Discharge status: Home / Self Care | Payer: Medicaid Other | Attending: Emergency Medicine | Admitting: Emergency Medicine

## 2019-12-22 ENCOUNTER — Other Ambulatory Visit: Payer: Self-pay

## 2019-12-22 ENCOUNTER — Encounter (HOSPITAL_COMMUNITY): Payer: Self-pay | Admitting: Emergency Medicine

## 2019-12-22 ENCOUNTER — Emergency Department (HOSPITAL_COMMUNITY): Payer: Medicaid Other

## 2019-12-22 DIAGNOSIS — Z7951 Long term (current) use of inhaled steroids: Secondary | ICD-10-CM | POA: Insufficient documentation

## 2019-12-22 DIAGNOSIS — R109 Unspecified abdominal pain: Secondary | ICD-10-CM

## 2019-12-22 DIAGNOSIS — R1031 Right lower quadrant pain: Secondary | ICD-10-CM | POA: Insufficient documentation

## 2019-12-22 DIAGNOSIS — R1032 Left lower quadrant pain: Secondary | ICD-10-CM | POA: Diagnosis not present

## 2019-12-22 DIAGNOSIS — R1030 Lower abdominal pain, unspecified: Secondary | ICD-10-CM | POA: Diagnosis present

## 2019-12-22 DIAGNOSIS — R197 Diarrhea, unspecified: Secondary | ICD-10-CM | POA: Diagnosis not present

## 2019-12-22 DIAGNOSIS — E119 Type 2 diabetes mellitus without complications: Secondary | ICD-10-CM | POA: Insufficient documentation

## 2019-12-22 LAB — URINALYSIS, ROUTINE W REFLEX MICROSCOPIC
Bacteria, UA: NONE SEEN
Bilirubin Urine: NEGATIVE
Glucose, UA: NEGATIVE mg/dL
Hgb urine dipstick: NEGATIVE
Ketones, ur: NEGATIVE mg/dL
Nitrite: NEGATIVE
Protein, ur: NEGATIVE mg/dL
Specific Gravity, Urine: 1.024 (ref 1.005–1.030)
pH: 5 (ref 5.0–8.0)

## 2019-12-22 LAB — COMPREHENSIVE METABOLIC PANEL
ALT: 16 U/L (ref 0–44)
AST: 27 U/L (ref 15–41)
Albumin: 4.7 g/dL (ref 3.5–5.0)
Alkaline Phosphatase: 88 U/L (ref 38–126)
Anion gap: 10 (ref 5–15)
BUN: 28 mg/dL — ABNORMAL HIGH (ref 6–20)
CO2: 24 mmol/L (ref 22–32)
Calcium: 9.5 mg/dL (ref 8.9–10.3)
Chloride: 103 mmol/L (ref 98–111)
Creatinine, Ser: 0.9 mg/dL (ref 0.44–1.00)
GFR calc Af Amer: >60 mL/min (ref >60)
GFR calc non Af Amer: >60 mL/min (ref >60)
Glucose, Bld: 178 mg/dL — ABNORMAL HIGH (ref 70–99)
Potassium: 3.8 mmol/L (ref 3.5–5.1)
Sodium: 137 mmol/L (ref 135–145)
Total Bilirubin: 0.5 mg/dL (ref 0.3–1.2)
Total Protein: 7.7 g/dL (ref 6.5–8.1)

## 2019-12-22 LAB — LIPASE, BLOOD: Lipase: 32 U/L (ref 11–51)

## 2019-12-22 LAB — CBC
HCT: 42.2 % (ref 36.0–46.0)
Hemoglobin: 13.6 g/dL (ref 12.0–15.0)
MCH: 27.5 pg (ref 26.0–34.0)
MCHC: 32.2 g/dL (ref 30.0–36.0)
MCV: 85.3 fL (ref 80.0–100.0)
Platelets: 81 10*3/uL — ABNORMAL LOW (ref 150–400)
RBC: 4.95 MIL/uL (ref 3.87–5.11)
RDW: 12.3 % (ref 11.5–15.5)
WBC: 12.4 10*3/uL — ABNORMAL HIGH (ref 4.0–10.5)
nRBC: 0 % (ref 0.0–0.2)

## 2019-12-22 LAB — I-STAT BETA HCG BLOOD, ED (MC, WL, AP ONLY): I-stat hCG, quantitative: <5 m[IU]/mL (ref <5)

## 2019-12-22 MED ORDER — SODIUM CHLORIDE 0.9 % IV BOLUS
1000.0000 mL | Freq: Once | INTRAVENOUS | Status: AC
  Administered 2019-12-22: 1000 mL via INTRAVENOUS

## 2019-12-22 MED ORDER — IOHEXOL 300 MG/ML  SOLN
100.0000 mL | Freq: Once | INTRAMUSCULAR | Status: AC | PRN
  Administered 2019-12-22: 100 mL via INTRAVENOUS

## 2019-12-22 MED ORDER — SODIUM CHLORIDE 0.9% FLUSH: 3.0000 mL | Freq: Once | INTRAVENOUS | Status: DC

## 2019-12-22 MED ORDER — DICYCLOMINE HCL 20 MG PO TABS
20.0000 mg | ORAL_TABLET | Freq: Two times a day (BID) | ORAL | 0 refills | Status: DC
Start: 2019-12-22 — End: 2020-04-15

## 2019-12-22 MED ORDER — DICYCLOMINE HCL 10 MG PO CAPS
10.0000 mg | ORAL_CAPSULE | Freq: Once | ORAL | Status: AC
  Administered 2019-12-22: 10 mg via ORAL
  Filled 2019-12-22: qty 1

## 2019-12-22 NOTE — ED Provider Notes (Signed)
[facility] DEPT Provider Note   CSN: [id number] Arrival date & time: 12/22/19  0048     History Chief Complaint  Patient presents with  . Abdominal Pain    Haily Caley is a 49 y.o. female with a history of metastatic melanoma with brain metastasis, prediabetes, anemia, seasonal allergies who presents to the emergency department with a chief complaint of abdominal pain.  The patient developed sudden onset lower abdominal pain at approximately 2100 last night that was sharp and crampy and accompanied by multiple loose, watery bowel movements.  Pain was worse when she had to have a bowel movement.  No other known aggravating or alleviating factors.  She reports that the pain lasted for approximately 3 hours and then seemed to resolve.  She has been having milder episodes of pain since that time.   She developed a small amount of bright red blood in the latter bowel movements.  No melena, nausea, vomiting, dysuria, hematuria, vaginal bleeding or discharge, back pain, chest pain, shortness of breath, rash, constipation, fever, or chills.  She reports that she did have some fried chicken tenders that she made at home for dinner and potato skins with sour cream.  She is unsure if she may have undercooked to the chicken or if the sour cream had been left out at room temperature for an unknown amount of time.  No recent camping.  No other recent suspicious food intake.  No known sick contacts.  She states that she has recently changed her diet and has been trying to eat more healthy.  She started taking probiotics and has not eaten much fried food recently until last night for dinner.  She does also note that she has not had a menstrual cycle in several months, but was not having regular menstrual cycles for some time.    The history is provided by the patient. No language interpreter was used.       Past Medical History:  Diagnosis Date  . Anemia   .  Anemia   . Diabetes mellitus without complication ([place])    prediabetic  . melanoma with met dz dx'd 01/2018   brain and adrenal  . Seasonal allergies     Patient Active Problem List   Diagnosis Date Noted  . Melanoma of skin ([place]) 05/29/2018  . Goals of care, counseling/discussion 05/29/2018  . Brain metastasis ([place]) 01/26/2018    Past Surgical History:  Procedure Laterality Date  . BIOPSY  01/28/2018   Procedure: BIOPSY;  Surgeon: Laurence Spates, MD;  Location: [facility] END[facility]COPY;  Service: Endoscopy;;  . BREAST BIOPSY Left   . ESOPHAGOGASTRODUODENOSCOPY N/A 01/28/2018   Procedure: ESOPHAGOGASTRODUODENOSCOPY (EGD);  Surgeon: Laurence Spates, MD;  Location: [facility];  Service: Endoscopy;  Laterality: N/A;  . MOUTH SURGERY    . OVARY SURGERY     removed "something"     OB History    Gravida  0   Para  0   Term  0   Preterm  0   AB  0   Living  0     SAB  0   TAB  0   Ectopic  0   Multiple  0   Live Births  0           Family History  Problem Relation Age of Onset  . Hypertension Father   . Breast cancer Maternal Aunt   . Diabetes Maternal Aunt   . Breast cancer Paternal Aunt   .  Breast cancer Maternal Grandmother   . Breast cancer Paternal Grandmother   . Diabetes Paternal Grandmother     Social History   Tobacco Use  . Smoking status: Never Smoker  . Smokeless tobacco: Never Used  Vaping Use  . Vaping Use: Never used  Substance Use Topics  . Alcohol use: No  . Drug use: No    Home Medications Prior to Admission medications   Medication Sig Start Date End Date Taking? Authorizing Provider  Acetaminophen 500 MG capsule Take 1,000 mg by mouth every 6 (six) hours as needed.     [provider]  cimetidine (TAGAMET) 400 MG tablet Take 1 tablet (400 mg total) by mouth 2 (two) times daily. 06/20/19   Tyler Pita, MD  COLLAGEN PO Take by mouth.    [provider]  dicyclomine (BENTYL) 20 MG tablet Take 1 tablet (20 mg  total) by mouth 2 (two) times daily. 12/22/19   Ahnika Hannibal A, PA-C  fluticasone (FLONASE) 50 MCG/ACT nasal spray Place 1 spray into both nostrils daily. 09/06/18   Ventura Sellers, MD  Lactobacillus (PROBIOTIC ACIDOPHILUS PO) Take 2 Doses by mouth daily. 10/04/19   [provider]  loratadine (CLARITIN) 10 MG tablet Take 10 mg by mouth daily.    [provider]  Milk Thistle 175 MG CAPS Take 175 mg by mouth daily. 10/04/19   [provider]  Multiple Vitamin (MULTIVITAMIN WITH MINERALS) TABS tablet Take 1 tablet by mouth daily.    [provider]  mupirocin ointment (BACTROBAN) 2 % APPLY INTO THE NOSE AS DIRECTED TWICE DAILY 06/05/19   Wyatt Portela, MD  solifenacin (VESICARE) 5 MG tablet TAKE 1 TABLET (5 MG TOTAL) BY MOUTH DAILY. 06/01/19   Bruning, Ashlyn, PA-C  TURMERIC PO Take by mouth.    [provider]    Allergies    Patient has no known allergies.  Review of Systems   Review of Systems  Constitutional: Negative for activity change, chills, diaphoresis and fever.  Respiratory: Negative for shortness of breath.   Cardiovascular: Negative for chest pain.  Gastrointestinal: Positive for abdominal pain and diarrhea. Negative for blood in stool, constipation, nausea and vomiting.  Genitourinary: Negative for dysuria, flank pain, frequency, genital sores, menstrual problem, urgency, vaginal bleeding, vaginal discharge and vaginal pain.  Musculoskeletal: Negative for back pain, gait problem, myalgias, neck pain and neck stiffness.  Skin: Negative for color change, rash and wound.  Allergic/Immunologic: Negative for immunocompromised state.  Neurological: Negative for dizziness, seizures, syncope, weakness, numbness and headaches.  Psychiatric/Behavioral: Negative for confusion.    Physical Exam Updated Vital Signs BP (!) 150/108   Pulse 77   Temp 98.6 F (37 C) (Oral)   Resp 14   Ht '5\' 1"'$  (1.549 m)   Wt 62.6 kg   SpO2 97%   BMI 26.07  kg/m   Physical Exam Vitals and nursing note reviewed.  Constitutional:      General: She is not in acute distress. HENT:     Head: Normocephalic.     Mouth/Throat:     Mouth: Mucous membranes are dry.     Pharynx: No oropharyngeal exudate or posterior oropharyngeal erythema.  Eyes:     Conjunctiva/sclera: Conjunctivae normal.  Cardiovascular:     Rate and Rhythm: Normal rate and regular rhythm.     Heart sounds: No murmur heard.  No friction rub. No gallop.   Pulmonary:     Effort: Pulmonary effort is normal. No respiratory distress.  Breath sounds: No stridor. No wheezing, rhonchi or rales.  Chest:     Chest wall: No tenderness.  Abdominal:     General: There is no distension.     Palpations: Abdomen is soft. There is no mass.     Tenderness: There is no abdominal tenderness. There is no right CVA tenderness, left CVA tenderness, guarding or rebound.     Hernia: No hernia is present.     Comments: Patient points to the bilateral lower abdomen where she is currently having pain.  With palpation, she has no reproducible tenderness on exam.  No rebound or guarding.  No CVA tenderness bilaterally.  No tenderness over McBurney's point.  Mildly hyperactive bowel sounds in all 4 quadrants.  Musculoskeletal:     Cervical back: Neck supple.     Right lower leg: No edema.     Left lower leg: No edema.  Skin:    General: Skin is warm.     Capillary Refill: Capillary refill takes less than 2 seconds.     Findings: No rash.  Neurological:     Mental Status: She is alert.  Psychiatric:        Behavior: Behavior normal.     ED Results / Procedures / Treatments   Labs (all labs ordered are listed, but only abnormal results are displayed) Labs Reviewed  COMPREHENSIVE METABOLIC PANEL - Abnormal; Notable for the following components:      Result Value   Glucose, Bld 178 (*)    BUN 28 (*)    All other components within normal limits  CBC - Abnormal; Notable for the following  components:   WBC 12.4 (*)    Platelets 81 (*)    All other components within normal limits  URINALYSIS, ROUTINE W REFLEX MICROSCOPIC - Abnormal; Notable for the following components:   Leukocytes,Ua TRACE (*)    All other components within normal limits  LIPASE, BLOOD  I-STAT BETA HCG BLOOD, ED ([facility], [facility], [facility] ONLY)    EKG None  Radiology CT ABDOMEN PELVIS W CONTRAST  Result Date: 12/22/2019 CLINICAL DATA:  Rectal bleeding.  History of metastatic melanoma. EXAM: CT ABDOMEN AND PELVIS WITH CONTRAST TECHNIQUE: Multidetector CT imaging of the abdomen and pelvis was performed using the standard protocol following bolus administration of intravenous contrast. CONTRAST:  123m OMNIPAQUE IOHEXOL 300 MG/ML  SOLN COMPARISON:  CT 09/04/2019 FINDINGS: Lower chest: Lung bases are clear. Hepatobiliary: No focal hepatic lesion. Nodules in the gallbladder again noted. Pancreas: Pancreas is normal. No ductal dilatation. No pancreatic inflammation. Spleen: Normal spleen Adrenals/urinary tract: Nodule of the LEFT adrenal gland measures 14 mm not significantly changed. Nodule of the RIGHT adrenal gland measures 18 mm not changed. Kidneys, ureters and bladder normal. Stomach/Bowel: Stomach, duodenum and small-bowel normal. No evidence of bowel obstruction. Terminal ileum is normal. Appendix not identified. Ascending colon is normal. Transverse colon normal. The descending colon is collapsed. No diverticular disease. No inflammation. Rectum normal. Vascular/Lymphatic: Abdominal aorta is normal caliber. No periportal or retroperitoneal adenopathy. No pelvic adenopathy. Reproductive: Uterus and adnexa unremarkable. Other: No peritoneal nodularity. Musculoskeletal: Sclerotic lesion in the RIGHT sacrum is unchanged. Hemangioma in the T12 vertebral body. IMPRESSION: 1. No explanation for GI bleeding. No colon inflammation. The LEFT colon is collapsed. 2. Stable nodularity of the gallbladder and adrenal glands previous described  as metastatic melanoma. 3. No acute findings in the abdomen pelvis. Electronically Signed   By: SSuzy BouchardM.D.   On: 12/22/2019 07:01    Procedures  Procedures (including critical care time)  Medications Ordered in ED Medications  sodium chloride flush (NS) 0.9 % injection 3 mL (has no administration in time range)  sodium chloride 0.9 % bolus 1,000 mL (0 mLs Intravenous Stopped 12/22/19 0810)  iohexol (OMNIPAQUE) 300 MG/ML solution 100 mL (100 mLs Intravenous Contrast Given 12/22/19 0546)  dicyclomine (BENTYL) capsule 10 mg (10 mg Oral Given 12/22/19 0810)    ED Course  I have reviewed the triage vital signs and the nursing notes.  Pertinent labs & imaging results that were available during my care of the patient were reviewed by me and considered in my medical decision making (see chart for details).    MDM Rules/Calculators/A&P                          49 year old female with a history of metastatic melanoma with brain metastasis, prediabetes, anemia, seasonal allergies who presents the emergency department with sudden onset lower abdominal pain and diarrhea, tonight.  She is having no constitutional symptoms, vomiting, or GU complaints.  Her abdominal exam is benign and without reproducible tenderness despite endorsing pain during the time of exam.  She is mildly hypertensive, but vital signs are otherwise unremarkable.  The patient was discussed with Dr. Sedonia Small, attending physician.  Given her history of metastatic cancer, will repeat CT abdomen pelvis to ensure that she does not have a bowel obstruction, worsening metastatic disease versus volvulus or diverticulitis.  CT abdomen pelvis with collapse of the left colon.  Discussed this with the radiologist as this appears new from March 2021, but he reports that is not suggestive of volvulus, bowel obstruction.  He just notes that the left colon is void of gas or stool.  BUN is acutely elevated.  She did note a small amount of  bright red stool after having a bowel movement.  She has otherwise had no melena or hematochezia.  She has had a negative Cologuard in the past.  I have a low suspicion for GI bleed at this time and suspect this is secondary to straining.  I have a low suspicion for hemorrhagic E. coli as she is having no other infectious symptoms.  There is a leukocytosis of unknown significance.  UA with small leukocytosis, but otherwise not concerning for infection and antibiotic administration is not indicated as patient is asymptomatic.  Discussed this with Dr. Sedonia Small who does not feel that the patient needs to be covered for infectious diarrhea at this time given that there was no evidence of enterocolitis on CT and the patient's abdominal exam is benign.  Bentyl given in the ER for symptoms.  She has been successfully fluid challenge.  She has a follow-up appointment with her PCP on Tuesday.  Given her medical history, she was given very strict return precautions to the emergency department for new or worsening symptoms.  She is hemodynamically no acute distress.  Safe for discharge to home with outpatient follow-up as indicated.  Final Clinical Impression(s) / ED Diagnoses Final diagnoses:  Abdominal cramping  Diarrhea, unspecified type    Rx / DC Orders ED Discharge Orders         Ordered    dicyclomine (BENTYL) 20 MG tablet  2 times daily     Discontinue  Reprint     12/22/19 0726           Joanne Gavel, PA-C 12/22/19 7673    Maudie Flakes, MD 12/24/19 [phone]

## 2019-12-22 NOTE — ED Triage Notes (Signed)
Patient comes from home by [facility]. Patient is complaining abdominal pain that started 11 pm. That she is bleeding from rectum after having two bowel movements. Patient still has abdominal cramping.

## 2019-12-22 NOTE — ED Notes (Signed)
Pt ambulated to the restroom with no assistance. Gait steady  

## 2019-12-22 NOTE — Discharge Instructions (Signed)
Thank you for allowing me to care for you today in the Emergency Department.   Keep your follow-up appointment with your primary care clinician on Tuesday.  You can take 1 tablet of Bentyl 2 times daily to help with pain.  You can also try taking 650 mg of Tylenol.  Make sure that you are drinking plenty of fluids to avoid dehydration.  I have also attached instructions on a bland diet, which may cause less abdominal pain over the next few days.  You should return to the emergency department if you start having significantly worsening blood in your stool, large amounts of black, tarry stool, if you also develop uncontrollable vomiting, fever, chills, or other new, concerning symptoms.

## 2019-12-23 ENCOUNTER — Other Ambulatory Visit: Payer: Self-pay

## 2019-12-23 ENCOUNTER — Encounter (HOSPITAL_COMMUNITY): Payer: Self-pay | Admitting: Emergency Medicine

## 2019-12-23 ENCOUNTER — Emergency Department (HOSPITAL_COMMUNITY)
Admission: EM | Admit: 2019-12-23 | Discharge: 2019-12-24 | Disposition: A | Discharge status: Home / Self Care | Payer: Medicaid Other | Attending: Emergency Medicine | Admitting: Emergency Medicine

## 2019-12-23 DIAGNOSIS — R7303 Prediabetes: Secondary | ICD-10-CM | POA: Insufficient documentation

## 2019-12-23 DIAGNOSIS — D696 Thrombocytopenia, unspecified: Secondary | ICD-10-CM | POA: Diagnosis not present

## 2019-12-23 DIAGNOSIS — K625 Hemorrhage of anus and rectum: Secondary | ICD-10-CM | POA: Insufficient documentation

## 2019-12-23 DIAGNOSIS — Z85841 Personal history of malignant neoplasm of brain: Secondary | ICD-10-CM | POA: Diagnosis not present

## 2019-12-23 DIAGNOSIS — Z85858 Personal history of malignant neoplasm of other endocrine glands: Secondary | ICD-10-CM | POA: Insufficient documentation

## 2019-12-23 DIAGNOSIS — K921 Melena: Secondary | ICD-10-CM

## 2019-12-23 DIAGNOSIS — R109 Unspecified abdominal pain: Secondary | ICD-10-CM | POA: Diagnosis present

## 2019-12-23 LAB — COMPREHENSIVE METABOLIC PANEL
ALT: 21 U/L (ref 0–44)
AST: 29 U/L (ref 15–41)
Albumin: 4.8 g/dL (ref 3.5–5.0)
Alkaline Phosphatase: 84 U/L (ref 38–126)
Anion gap: 12 (ref 5–15)
BUN: 19 mg/dL (ref 6–20)
CO2: 26 mmol/L (ref 22–32)
Calcium: 9.6 mg/dL (ref 8.9–10.3)
Chloride: 103 mmol/L (ref 98–111)
Creatinine, Ser: 0.72 mg/dL (ref 0.44–1.00)
GFR calc Af Amer: >60 mL/min (ref >60)
GFR calc non Af Amer: >60 mL/min (ref >60)
Glucose, Bld: 87 mg/dL (ref 70–99)
Potassium: 3.6 mmol/L (ref 3.5–5.1)
Sodium: 141 mmol/L (ref 135–145)
Total Bilirubin: 0.5 mg/dL (ref 0.3–1.2)
Total Protein: 7.9 g/dL (ref 6.5–8.1)

## 2019-12-23 LAB — URINALYSIS, ROUTINE W REFLEX MICROSCOPIC
Bilirubin Urine: NEGATIVE
Glucose, UA: NEGATIVE mg/dL
Hgb urine dipstick: NEGATIVE
Ketones, ur: NEGATIVE mg/dL
Leukocytes,Ua: NEGATIVE
Nitrite: NEGATIVE
Protein, ur: NEGATIVE mg/dL
Specific Gravity, Urine: 1.014 (ref 1.005–1.030)
pH: 5 (ref 5.0–8.0)

## 2019-12-23 LAB — CBC
HCT: 43 % (ref 36.0–46.0)
Hemoglobin: 13.8 g/dL (ref 12.0–15.0)
MCH: 27.9 pg (ref 26.0–34.0)
MCHC: 32.1 g/dL (ref 30.0–36.0)
MCV: 86.9 fL (ref 80.0–100.0)
Platelets: 86 10*3/uL — ABNORMAL LOW (ref 150–400)
RBC: 4.95 MIL/uL (ref 3.87–5.11)
RDW: 12.6 % (ref 11.5–15.5)
WBC: 6.6 10*3/uL (ref 4.0–10.5)
nRBC: 0 % (ref 0.0–0.2)

## 2019-12-23 LAB — I-STAT BETA HCG BLOOD, ED (MC, WL, AP ONLY): I-stat hCG, quantitative: <5 m[IU]/mL (ref <5)

## 2019-12-23 LAB — LIPASE, BLOOD: Lipase: 32 U/L (ref 11–51)

## 2019-12-23 MED ORDER — SODIUM CHLORIDE 0.9% FLUSH: 3.0000 mL | Freq: Once | INTRAVENOUS | Status: DC

## 2019-12-23 NOTE — ED Provider Notes (Signed)
Kaitlyn Duncan Provider Note   CSN: [id number] Arrival date & time: 12/23/19  1818     History Chief Complaint  Patient presents with  . Abdominal Pain  . Rectal Bleeding    Kaitlyn Duncan is a 49 y.o. female with a history of metastatic melanoma with brain metastasis, prediabetes, and anemia who presents the emergency department with a chief complaint of hematochezia.  The patient was seen for the same in the ER 2 nights ago after she had a small amount of bright red rectal bleeding in the setting of lower abdominal cramping and multiple loose bowel movements.  During her last visit, she was observed in the ER for almost 8 hours and had no abdominal pain, diarrhea, or rectal bleeding.     She did have a very short episode earlier today of abdominal cramping since she was discharged from the ER 2 days ago that was short-lived, but is pain-free at this time.  Since being discharged from the hospital, she has had 2 formed bowel movements with red streaks of blood mixed within the stool.  She does note that she had an episode earlier today of anal bleeding without passing any stool, but this has since subsided.  She also notes that she had a similar episode 48 hours ago shortly after being discharged from the ER.  She has seen a gastroenterologist several years ago while she was hospitalized, but has not followed in the outpatient setting.   She denies diarrhea, nausea, vomiting, hematemesis, melena, back pain, dysuria, hematuria, vaginal bleeding or discharge, shortness of breath, dizziness, lightheadedness, near syncope, or worsening fatigue.  She completed Cologuard within the last year, but has never had a colonoscopy.  The history is provided by the patient. No language interpreter was used.       Past Medical History:  Diagnosis Date  . Anemia   . Anemia   . Diabetes mellitus without complication (HCC)    prediabetic  . melanoma with met dz  dx'd 01/2018   brain and adrenal  . Seasonal allergies     Patient Active Problem List   Diagnosis Date Noted  . Melanoma of skin (HCC) 05/29/2018  . Goals of care, counseling/discussion 05/29/2018  . Brain metastasis (HCC) 01/26/2018    Past Surgical History:  Procedure Laterality Date  . BIOPSY  01/28/2018   Procedure: BIOPSY;  Surgeon: Carman Ching, MD;  Location: [facility];  Service: Endoscopy;;  . BREAST BIOPSY Left   . ESOPHAGOGASTRODUODENOSCOPY N/A 01/28/2018   Procedure: ESOPHAGOGASTRODUODENOSCOPY (EGD);  Surgeon: Carman Ching, MD;  Location: [facility];  Service: Endoscopy;  Laterality: N/A;  . MOUTH SURGERY    . OVARY SURGERY     removed "something"     OB History    Gravida  0   Para  0   Term  0   Preterm  0   AB  0   Living  0     SAB  0   TAB  0   Ectopic  0   Multiple  0   Live Births  0           Family History  Problem Relation Age of Onset  . Hypertension Father   . Breast cancer Maternal Aunt   . Diabetes Maternal Aunt   . Breast cancer Paternal Aunt   . Breast cancer Maternal Grandmother   . Breast cancer Paternal Grandmother   . Diabetes Paternal Grandmother     Social  History   Tobacco Use  . Smoking status: Never Smoker  . Smokeless tobacco: Never Used  Vaping Use  . Vaping Use: Never used  Substance Use Topics  . Alcohol use: No  . Drug use: No    Home Medications Prior to Admission medications   Medication Sig Start Date End Date Taking? Authorizing Provider  Acetaminophen 500 MG capsule Take 1,000 mg by mouth every 6 (six) hours as needed.     [provider]  cimetidine (TAGAMET) 400 MG tablet Take 1 tablet (400 mg total) by mouth 2 (two) times daily. 06/20/19   Margaretmary Dys, MD  COLLAGEN PO Take by mouth.    [provider]  dicyclomine (BENTYL) 20 MG tablet Take 1 tablet (20 mg total) by mouth 2 (two) times daily. 12/22/19   Kanai Berrios A, PA-C  fluticasone (FLONASE) 50 MCG/ACT  nasal spray Place 1 spray into both nostrils daily. 09/06/18   Vaslow, Georgeanna Lea, MD  hydrocortisone (ANUSOL-HC) 25 MG suppository Place 1 suppository (25 mg total) rectally 2 (two) times daily as needed for hemorrhoids or anal itching. 12/24/19   Alexander Mcauley A, PA-C  Lactobacillus (PROBIOTIC ACIDOPHILUS PO) Take 2 Doses by mouth daily. 10/04/19   [provider]  loratadine (CLARITIN) 10 MG tablet Take 10 mg by mouth daily.    [provider]  Milk Thistle 175 MG CAPS Take 175 mg by mouth daily. 10/04/19   [provider]  Multiple Vitamin (MULTIVITAMIN WITH MINERALS) TABS tablet Take 1 tablet by mouth daily.    [provider]  mupirocin ointment (BACTROBAN) 2 % APPLY INTO THE NOSE AS DIRECTED TWICE DAILY 06/05/19   Benjiman Core, MD  solifenacin (VESICARE) 5 MG tablet TAKE 1 TABLET (5 MG TOTAL) BY MOUTH DAILY. 06/01/19   Bruning, Ashlyn, PA-C  TURMERIC PO Take by mouth.    [provider]    Allergies    Patient has no known allergies.  Review of Systems   Review of Systems  Constitutional: Negative for activity change, chills and fever.  HENT: Negative for congestion, sinus pressure, sinus pain and sore throat.   Respiratory: Negative for shortness of breath.   Cardiovascular: Negative for chest pain.  Gastrointestinal: Positive for anal bleeding and blood in stool. Negative for abdominal distention, abdominal pain, constipation, diarrhea, nausea, rectal pain and vomiting.  Genitourinary: Negative for dysuria, flank pain, frequency, hematuria, vaginal bleeding and vaginal discharge.  Musculoskeletal: Negative for back pain, joint swelling, neck pain and neck stiffness.  Skin: Negative for rash.  Allergic/Immunologic: Negative for immunocompromised state.  Neurological: Negative for seizures, syncope, weakness, numbness and headaches.  Psychiatric/Behavioral: Negative for confusion.    Physical Exam Updated Vital Signs BP 140/87 (BP Location:  Left Arm)   Pulse 69   Temp 97.7 F (36.5 C) (Oral)   Resp 18   SpO2 99%   Physical Exam Vitals and nursing note reviewed.  Constitutional:      General: She is not in acute distress.    Appearance: She is not ill-appearing, toxic-appearing or diaphoretic.     Comments: Well-appearing.  No acute distress.  HENT:     Head: Normocephalic.  Eyes:     Conjunctiva/sclera: Conjunctivae normal.  Cardiovascular:     Rate and Rhythm: Normal rate and regular rhythm.     Pulses: Normal pulses.     Heart sounds: Normal heart sounds. No murmur heard.  No friction rub. No gallop.   Pulmonary:     Effort:  Pulmonary effort is normal. No respiratory distress.     Breath sounds: No stridor. No wheezing, rhonchi or rales.     Comments: Lungs are clear to auscultation bilaterally without increased work of breathing. Chest:     Chest wall: No tenderness.  Abdominal:     General: There is no distension.     Palpations: Abdomen is soft.     Tenderness: There is no abdominal tenderness. There is no guarding or rebound.     Comments: Abdomen is soft, nontender, nondistended.  Normoactive bowel sounds.  Genitourinary:    Comments: Chaperoned exam.  There is a single nonthrombosed external hemorrhoid.  Normal rectal tone.  Soft brown stool is noted on DRE.  No frank blood.  There is a questionable internal hemorrhoid noted at approximately 3:00.  No fissures or anal tears.  No blood noted around the anal orifice. Musculoskeletal:     Cervical back: Neck supple.  Skin:    General: Skin is warm.     Findings: No rash.  Neurological:     Mental Status: She is alert.  Psychiatric:        Behavior: Behavior normal.     ED Results / Procedures / Treatments   Labs (all labs ordered are listed, but only abnormal results are displayed) Labs Reviewed  CBC - Abnormal; Notable for the following components:      Result Value   Platelets 86 (*)    All other components within normal limits  LIPASE,  BLOOD  COMPREHENSIVE METABOLIC PANEL  URINALYSIS, ROUTINE W REFLEX MICROSCOPIC  I-STAT BETA HCG BLOOD, ED ([facility], [facility], [facility] ONLY)  POC OCCULT BLOOD, ED    EKG None  Radiology No results found.  Procedures Procedures (including critical care time)  Medications Ordered in ED Medications - No data to display  ED Course  I have reviewed the triage vital signs and the nursing notes.  Pertinent labs & imaging results that were available during my care of the patient were reviewed by me and considered in my medical decision making (see chart for details).    MDM Rules/Calculators/A&P                          49 year old female with a history of metastatic melanoma with brain metastasis, prediabetes, and anemia presenting with hematochezia.  This patient is known to me as I saw and evaluated her in the ER 2 days ago.  At that time, she developed a small amount of hematochezia after having multiple episodes of diarrhea.  After being observed for many hours in the ER, she had no further bleeding or diarrhea and abdominal pain had resolved.  States that she has had 2 additional episodes as well as 2 episodes of a small amount of anal bleeding since discharge.  She has never had a colonoscopy.  Abdominal pain is resolved.  The patient was discussed with Dr. Florina Ou, attending physician.  Hemoglobin is improved from 48 hours ago, 13.8 today.  Rectal exam performed by me and chaperoned by RN.  She had one single nonthrombosed external hemorrhoid and DRE was concerning for internal hemorrhoids.  Hemoccult was negative with sufficient stool sample provided.  She has a thrombocytopenia, but this appears stable for the last 2 years.  I suspect her bleeding could be from an internal hemorrhoid or a small diverticular bleed.  She is hemodynamically stable and her hemoglobin is stable today.  I will refer her back to [facility]  gastroenterology as they saw her several years ago during an inpatient admission.  She will  likely need a colonoscopy.  This plan has been discussed with the patient and she is in agreement.  She was also given very strict return precautions to the ER if she were to develop melena, significantly worsening bleeding, signs or symptoms of symptomatic anemia, or other new concerning symptoms.  I have a very low suspicion for upper GI bleed, variceal bleed.  Diverticulitis as she had a negative CT 2 days ago, bowel obstruction.  She is hemodynamically stable and in no acute distress.  Safe for discharge home with outpatient follow-up.    Final Clinical Impression(s) / ED Diagnoses Final diagnoses:  Hematochezia  Thrombocytopenia ([place])    Rx / DC Orders ED Discharge Orders         Ordered    hydrocortisone (ANUSOL-HC) 25 MG suppository  2 times daily PRN     Discontinue  Reprint     12/24/19 0129           Makenzey Nanni A, PA-C 12/24/19 0955    Molpus, John, MD 12/24/19 2238

## 2019-12-23 NOTE — ED Triage Notes (Addendum)
Patient c/o two episodes of bright red blood in stool with abdominal pain in the last 24 hours. Hx metastatic melanoma.

## 2019-12-24 LAB — POC OCCULT BLOOD, ED: Fecal Occult Bld: NEGATIVE

## 2019-12-24 MED ORDER — HYDROCORTISONE ACETATE 25 MG RE SUPP: 25.0000 mg | Freq: Two times a day (BID) | RECTAL | 0 refills | Status: DC | PRN

## 2019-12-24 NOTE — Discharge Instructions (Addendum)
Thank you for allowing me to care for you today in the Emergency Department.   As we discussed, your work-up was reassuring today.  We tested your stool for blood, and it was negative.  However, you need to call [facility] gastroenterology to schedule a follow-up appointment as you likely need a colonoscopy.  I would recommend calling their office first thing on Monday morning.  For pain, you can take 1 hydrocortisone suppository in place at rectally up to 2 times daily as needed for rectal pain.  You should return to the emergency department if bleeding significantly worsens and you are passing large clots of blood with or without stool, large black smelly bowel movements that look like tar, if you are having worsening bleeding and become very short of breath, feel as if you might pass out, or develop other new, concerning symptoms.

## 2020-01-04 ENCOUNTER — Other Ambulatory Visit: Payer: Self-pay | Admitting: Oncology

## 2020-01-04 ENCOUNTER — Other Ambulatory Visit: Payer: Self-pay | Admitting: Internal Medicine

## 2020-01-04 ENCOUNTER — Telehealth: Payer: Self-pay | Admitting: *Deleted

## 2020-01-04 DIAGNOSIS — C439 Malignant melanoma of skin, unspecified: Secondary | ICD-10-CM

## 2020-01-04 MED FILL — CIMETIDINE 400 MG TABLET: 400 | 30 days supply | Qty: 60 | Fill #5

## 2020-01-04 NOTE — Telephone Encounter (Signed)
Received call from patient requesting refill of her flonase. Last filled 09/06/18 @ [facility].

## 2020-01-08 ENCOUNTER — Ambulatory Visit (HOSPITAL_COMMUNITY): Payer: Medicaid Other

## 2020-01-08 ENCOUNTER — Other Ambulatory Visit: Payer: Self-pay

## 2020-01-08 ENCOUNTER — Telehealth: Payer: Self-pay | Admitting: *Deleted

## 2020-01-08 ENCOUNTER — Inpatient Hospital Stay: Payer: Medicaid Other | Attending: Obstetrics and Gynecology

## 2020-01-08 ENCOUNTER — Ambulatory Visit (HOSPITAL_COMMUNITY)
Admission: RE | Admit: 2020-01-08 | Discharge: 2020-01-08 | Disposition: A | Discharge status: Home / Self Care | Payer: Medicaid Other | Source: Ambulatory Visit | Attending: Oncology | Admitting: Oncology

## 2020-01-08 DIAGNOSIS — C439 Malignant melanoma of skin, unspecified: Secondary | ICD-10-CM

## 2020-01-08 DIAGNOSIS — C7931 Secondary malignant neoplasm of brain: Secondary | ICD-10-CM | POA: Insufficient documentation

## 2020-01-08 DIAGNOSIS — D696 Thrombocytopenia, unspecified: Secondary | ICD-10-CM | POA: Diagnosis not present

## 2020-01-08 DIAGNOSIS — Z803 Family history of malignant neoplasm of breast: Secondary | ICD-10-CM | POA: Insufficient documentation

## 2020-01-08 LAB — CBC WITH DIFFERENTIAL (CANCER CENTER ONLY)
Abs Immature Granulocytes: 0 10*3/uL (ref 0.00–0.07)
Basophils Absolute: 0 10*3/uL (ref 0.0–0.1)
Basophils Relative: 0 %
Eosinophils Absolute: 0 10*3/uL (ref 0.0–0.5)
Eosinophils Relative: 0 %
HCT: 42.2 % (ref 36.0–46.0)
Hemoglobin: 13.3 g/dL (ref 12.0–15.0)
Immature Granulocytes: 0 %
Lymphocytes Relative: 29 %
Lymphs Abs: 1.2 10*3/uL (ref 0.7–4.0)
MCH: 27.1 pg (ref 26.0–34.0)
MCHC: 31.5 g/dL (ref 30.0–36.0)
MCV: 86.1 fL (ref 80.0–100.0)
Monocytes Absolute: 0.4 10*3/uL (ref 0.1–1.0)
Monocytes Relative: 10 %
Neutro Abs: 2.5 10*3/uL (ref 1.7–7.7)
Neutrophils Relative %: 61 %
Platelet Count: 62 10*3/uL — ABNORMAL LOW (ref 150–400)
RBC: 4.9 MIL/uL (ref 3.87–5.11)
RDW: 12.4 % (ref 11.5–15.5)
WBC Count: 4.1 10*3/uL (ref 4.0–10.5)
nRBC: 0 % (ref 0.0–0.2)

## 2020-01-08 LAB — CMP (CANCER CENTER ONLY)
ALT: 11 U/L (ref 0–44)
AST: 21 U/L (ref 15–41)
Albumin: 4.3 g/dL (ref 3.5–5.0)
Alkaline Phosphatase: 80 U/L (ref 38–126)
Anion gap: 9 (ref 5–15)
BUN: 21 mg/dL — ABNORMAL HIGH (ref 6–20)
CO2: 23 mmol/L (ref 22–32)
Calcium: 9.7 mg/dL (ref 8.9–10.3)
Chloride: 109 mmol/L (ref 98–111)
Creatinine: 0.8 mg/dL (ref 0.44–1.00)
GFR, Est AFR Am: >60 mL/min (ref >60)
GFR, Estimated: >60 mL/min (ref >60)
Glucose, Bld: 92 mg/dL (ref 70–99)
Potassium: 4.1 mmol/L (ref 3.5–5.1)
Sodium: 141 mmol/L (ref 135–145)
Total Bilirubin: 0.3 mg/dL (ref 0.3–1.2)
Total Protein: 7.5 g/dL (ref 6.5–8.1)

## 2020-01-08 MED ORDER — IOHEXOL 300 MG/ML  SOLN
100.0000 mL | Freq: Once | INTRAMUSCULAR | Status: AC | PRN
  Administered 2020-01-08: 100 mL via INTRAVENOUS

## 2020-01-08 MED ORDER — SODIUM CHLORIDE (PF) 0.9 % IJ SOLN
INTRAMUSCULAR | Status: AC
  Filled 2020-01-08: qty 50

## 2020-01-08 MED ORDER — IOHEXOL 9 MG/ML PO SOLN
1000.0000 mL | ORAL | Status: AC
  Administered 2020-01-08: 1000 mL via ORAL

## 2020-01-08 MED ORDER — IOHEXOL 9 MG/ML PO SOLN
ORAL | Status: AC
  Filled 2020-01-08: qty 1000

## 2020-01-08 NOTE — Telephone Encounter (Signed)
Patient provided [place] with a [employer] [place] application.  It was received on June 29th and the completed form was faxed on 01/08/2020 to [phone].  Left message to let patient know form was completed.

## 2020-01-08 NOTE — Telephone Encounter (Signed)
Rx request, please review 

## 2020-01-11 ENCOUNTER — Other Ambulatory Visit: Payer: Self-pay | Admitting: Urology

## 2020-01-15 ENCOUNTER — Other Ambulatory Visit: Payer: Self-pay

## 2020-01-15 ENCOUNTER — Inpatient Hospital Stay (HOSPITAL_BASED_OUTPATIENT_CLINIC_OR_DEPARTMENT_OTHER): Payer: Medicaid Other | Admitting: Oncology

## 2020-01-15 ENCOUNTER — Other Ambulatory Visit: Payer: Self-pay | Admitting: Urology

## 2020-01-15 VITALS — BP 106/74 | HR 86 | Temp 97.7°F | Resp 18 | Ht 61.0 in | Wt 131.2 lb

## 2020-01-15 DIAGNOSIS — C7931 Secondary malignant neoplasm of brain: Secondary | ICD-10-CM | POA: Diagnosis not present

## 2020-01-15 DIAGNOSIS — C439 Malignant melanoma of skin, unspecified: Secondary | ICD-10-CM

## 2020-01-15 MED ORDER — SOLIFENACIN SUCCINATE 5 MG PO TABS: 5.0000 mg | ORAL_TABLET | Freq: Every day | ORAL | 5 refills | Status: DC

## 2020-01-15 NOTE — Progress Notes (Signed)
Hematology and Oncology Follow Up Visit  Kaitlyn Duncan [id number] 1970-11-01 49 y.o. 01/15/2020 3:15 PM Kaitlyn Duncan, [name], NP   Principle Diagnosis: 49 year old woman with melanoma presented with stage IV disease including lymphadenopathy peritoneal disease and brain involvement.    Prior Therapy: He is status post subcutaneous nodule biopsy on 01/27/2018 which confirmed the presence of metastatic melanoma. She status post endoscopy on 01/28/2018 which showed subcutaneous involvement that consistent with metastatic melanoma.  Whole brain radiation for total of 30 Gy in 10 fractions started on 02/07/2018.   Mektovi 45 mg bid, [name] 450 mg daily started in August 2019.  Therapy discontinued in November 2019 after partial response.   Pembrolizumab 200 mg every 3 weeks started on 06/05/2018.  She is status post 12 cycles completed in July 2020.    Current therapy: Active surveillance.    Interim History: [name] presents today for a repeat follow-up.  Since the last visit, she ports no major changes in her health.  She was seen in the emergency department last month after presenting with symptoms of diarrhea and noted blood per rectum.  The symptoms have resolved at this time.  She denies any abdominal pain or early satiety.  Her performance status quality of life remains excellent.  She did have a CT scan of the abdomen at that time which did not show any acute abnormalities.  Overall quality of life and performance status remains excellent.            .     Medications: Unchanged on review. Current Outpatient Medications  Medication Sig Dispense Refill  . Acetaminophen 500 MG capsule Take 1,000 mg by mouth every 6 (six) hours as needed.     . cimetidine (TAGAMET) 400 MG tablet Take 1 tablet (400 mg total) by mouth 2 (two) times daily. 60 tablet 5  . COLLAGEN PO Take by mouth.    . dicyclomine (BENTYL) 20 MG tablet Take 1 tablet (20 mg total) by  mouth 2 (two) times daily. 20 tablet 0  . fluticasone (FLONASE) 50 MCG/ACT nasal spray Place 1 spray into both nostrils daily. 9.9 g 2  . hydrocortisone (ANUSOL-HC) 25 MG suppository Place 1 suppository (25 mg total) rectally 2 (two) times daily as needed for hemorrhoids or anal itching. 12 suppository 0  . Lactobacillus (PROBIOTIC ACIDOPHILUS PO) Take 2 Doses by mouth daily.    Marland Kitchen loratadine (CLARITIN) 10 MG tablet Take 10 mg by mouth daily.    . Milk Thistle 175 MG CAPS Take 175 mg by mouth daily.    . Multiple Vitamin (MULTIVITAMIN WITH MINERALS) TABS tablet Take 1 tablet by mouth daily.    . mupirocin ointment (BACTROBAN) 2 % APPLY INTO THE NOSE AS DIRECTED TWICE DAILY 22 g 1  . solifenacin (VESICARE) 5 MG tablet TAKE 1 TABLET (5 MG TOTAL) BY MOUTH DAILY. 30 tablet 5  . TURMERIC PO Take by mouth.     No current facility-administered medications for this visit.     Allergies: No Known Allergies  .     Physical Exam:   Blood pressure 106/74, pulse 86, temperature 97.7 F (36.5 C), temperature source Temporal, resp. rate 18, height _0  (1.549 m), weight 131 lb 3.2 oz (59.5 kg), SpO2 99 %.     ECOG: 1     General appearance: Alert, awake without any distress. Head: Atraumatic without abnormalities Oropharynx: Without any thrush or ulcers. Eyes: No scleral icterus. Lymph nodes: No lymphadenopathy noted in the cervical,  supraclavicular, or axillary nodes Heart:regular rate and rhythm, without any murmurs or gallops.   Lung: Clear to auscultation without any rhonchi, wheezes or dullness to percussion. Abdomin: Soft, nontender without any shifting dullness or ascites. Musculoskeletal: No clubbing or cyanosis. Neurological: No motor or sensory deficits. Skin: No rashes or lesions.                  Lab Results: Lab Results  Component Value Date   WBC 4.1 01/08/2020   HGB 13.3 01/08/2020   HCT 42.2 01/08/2020   MCV 86.1 01/08/2020   PLT 62 (L)  01/08/2020     Chemistry      Component Value Date/Time   NA 141 01/08/2020 1210   K 4.1 01/08/2020 1210   CL 109 01/08/2020 1210   CO2 23 01/08/2020 1210   BUN 21 (H) 01/08/2020 1210   CREATININE 0.80 01/08/2020 1210      Component Value Date/Time   CALCIUM 9.7 01/08/2020 1210   ALKPHOS 80 01/08/2020 1210   AST 21 01/08/2020 1210   ALT 11 01/08/2020 1210   BILITOT 0.3 01/08/2020 1210     1. Stable post treatment appearance of widespread metastatic disease throughout the chest, abdomen, and pelvis, including pulmonary nodules, adrenal nodules, and soft tissue nodules. No evidence of new metastatic disease in the chest, abdomen, or pelvis.  2. Multiple unchanged hyperenhancing lesions of the caudate lobe of the liver and spleen, which are less clearly metastatic nodules, suspicious although possibly small incidental hemangiomata. Attention on follow-up.  3. Soft tissue mass within the gallbladder is less clearly appreciated on this examination, likely sensitive to specific phase of contrast, although not grossly changed.   49 year old woman with:  1.  Stage IV melanoma presented with diffuse lymphadenopathy and peritoneal disease in July 2019.     The natural course of her disease was updated today and treatment options were reviewed.  CT scan on July 6 of 2021 was discussed today which shows no progression of disease.  Treatment options were reviewed which include maintenance immunotherapy, BRAF targeted therapy or combination of immunotherapy were discussed.  At this time she opted to continue with active surveillance and reinstitute salvage therapy has relapsed disease.   2.  CNS metastasis: She continues to be on active surveillance at this time.  MRI obtained on May 13 which showed stable disease.  Next MRI will be obtained in 4 months.  3.  Prognosis: Disease is incurable although has been manageable at this time for the last 2 years.  Aggressive measures are  warranted.   4.  Thrombocytopenia: Etiology is unclear.  ITP versus treatment related effect could be a possibility.  No active bleeding was noted recommended continued active surveillance.  5.  Follow-up: In 3 months and repeat imaging studies in 6 months.  30  minutes were dedicated to this encounter.  Time was spent on reviewing her disease status, treatment options, reviewing imaging studies and future plan of care review.   Zola Button, MD 7/13/20213:15 PM

## 2020-01-16 ENCOUNTER — Telehealth: Payer: Self-pay | Admitting: *Deleted

## 2020-01-16 ENCOUNTER — Telehealth: Payer: Self-pay | Admitting: Oncology

## 2020-01-16 MED FILL — SOLIFENACIN SUCCINATE 5 MG: 5 | 30 days supply | Qty: 30 | Fill #0

## 2020-01-16 NOTE — Telephone Encounter (Signed)
Scheduled per 07/13 los, patient has been called and voicemail was left. 

## 2020-01-16 NOTE — Telephone Encounter (Signed)
Called patient to inform that script is ready for pick-up @ [facility], lvm for a return call

## 2020-01-30 ENCOUNTER — Ambulatory Visit (INDEPENDENT_AMBULATORY_CARE_PROVIDER_SITE_OTHER): Payer: Medicaid Other | Admitting: Physician Assistant

## 2020-01-30 ENCOUNTER — Encounter: Payer: Self-pay | Admitting: Physician Assistant

## 2020-01-30 VITALS — BP 100/74 | HR 72 | Ht 61.0 in | Wt 127.0 lb

## 2020-01-30 DIAGNOSIS — K625 Hemorrhage of anus and rectum: Secondary | ICD-10-CM

## 2020-01-30 DIAGNOSIS — R194 Change in bowel habit: Secondary | ICD-10-CM | POA: Diagnosis not present

## 2020-01-30 DIAGNOSIS — R1031 Right lower quadrant pain: Secondary | ICD-10-CM | POA: Diagnosis not present

## 2020-01-30 DIAGNOSIS — R12 Heartburn: Secondary | ICD-10-CM

## 2020-01-30 DIAGNOSIS — R1032 Left lower quadrant pain: Secondary | ICD-10-CM

## 2020-01-30 NOTE — Progress Notes (Signed)
Chief Complaint: Rectal bleeding and abdominal cramping, indigestion  HPI:    Kaitlyn Duncan is a 49 year old female with a past medical history as listed below including metastatic melanoma with brain mets, who was referred to me by [name], NP to for complaint of rectal bleeding and abdominal cramping with indigestion.      12/23/2019 patient seen in the ER for abdominal pain and rectal bleeding.  At that time as discussed she had been seen in the ER 2 nights ago after she had a small amount of bright red rectal bleeding in the setting of lower abdominal cramping and multiple loose bowel movements appearing she was observed for 8 hours in the ER at that time with no further bleeding pain or diarrhea.  At time of this ER visit she was describing to blood-streaked stools.  On rectal exam she had a single nonthrombosed external hemorrhoid, no frank blood.  CBC with platelets low at 86 and otherwise normal, lipase normal, CMP normal, urinalysis normal.  Occult blood negative.    01/08/2020 CT abdomen pelvis with contrast showed stable posttreatment appearance of widespread metastatic disease throughout the chest, abdomen and pelvis including pulmonary nodules, adrenal nodules and soft tissue nodules.  Multiple unchanged hyperenhancing lesions of the caudate lobe of the liver and spleen.  Soft tissue mass within the gallbladder.    Today, patient presents to clinic and tells me that prior to 12/21/2019 she was having normal bowel movements, but that day started with some lower abdominal cramping and at first just "a lot of stool", she had eaten some chicken tenders the night before and thought maybe it was due to this.  She then started with diarrhea and started seeing some blood mixed in with her stool.  This is when she was seen in the ED.  The bleeding has since stopped but she has continued with some lower abdominal cramping and variation in her bowel habits from diarrhea to constipation.  Tells me that  she wants to make sure everything is okay.  Describes having Cologuard testing last year which was negative.    Also discusses some indigestion and eructations today.  Currently she is using over-the-counter Tagamet daily.    Denies fever, chills, weight loss or symptoms that awaken her from sleep.  Past Medical History:  Diagnosis Date  . Anemia   . Anemia   . Diabetes mellitus without complication ([place])    prediabetic  . melanoma with met dz dx'd 01/2018   brain and adrenal  . Seasonal allergies     Past Surgical History:  Procedure Laterality Date  . BIOPSY  01/28/2018   Procedure: BIOPSY;  Surgeon: Laurence Spates, MD;  Location: [facility];  Service: Endoscopy;;  . BREAST BIOPSY Left   . ESOPHAGOGASTRODUODENOSCOPY N/A 01/28/2018   Procedure: ESOPHAGOGASTRODUODENOSCOPY (EGD);  Surgeon: Laurence Spates, MD;  Location: [facility];  Service: Endoscopy;  Laterality: N/A;  . MOUTH SURGERY    . OVARY SURGERY     removed "something"    Current Outpatient Medications  Medication Sig Dispense Refill  . Acetaminophen 500 MG capsule Take 1,000 mg by mouth every 6 (six) hours as needed.     . cimetidine (TAGAMET) 400 MG tablet Take 1 tablet (400 mg total) by mouth 2 (two) times daily. 60 tablet 5  . COLLAGEN PO Take by mouth.    . dicyclomine (BENTYL) 20 MG tablet Take 1 tablet (20 mg total) by mouth 2 (two) times daily. (Patient not taking: Reported on  01/15/2020) 20 tablet 0  . fluticasone (FLONASE) 50 MCG/ACT nasal spray Place 1 spray into both nostrils daily. 9.9 g 2  . hydrocortisone (ANUSOL-HC) 25 MG suppository Place 1 suppository (25 mg total) rectally 2 (two) times daily as needed for hemorrhoids or anal itching. 12 suppository 0  . Lactobacillus (PROBIOTIC ACIDOPHILUS PO) Take 2 Doses by mouth daily. (Patient not taking: Reported on 01/15/2020)    . loratadine (CLARITIN) 10 MG tablet Take 10 mg by mouth daily.    . Milk Thistle 175 MG CAPS Take 175 mg by mouth daily.    .  Multiple Vitamin (MULTIVITAMIN WITH MINERALS) TABS tablet Take 1 tablet by mouth daily.    . mupirocin ointment (BACTROBAN) 2 % APPLY INTO THE NOSE AS DIRECTED TWICE DAILY 22 g 1  . solifenacin (VESICARE) 5 MG tablet Take 1 tablet (5 mg total) by mouth daily. 30 tablet 5  . TURMERIC PO Take by mouth.     No current facility-administered medications for this visit.    Allergies as of 01/30/2020  . (No Known Allergies)    Family History  Problem Relation Age of Onset  . Hypertension Father   . Breast cancer Maternal Aunt   . Diabetes Maternal Aunt   . Breast cancer Paternal Aunt   . Breast cancer Maternal Grandmother   . Breast cancer Paternal Grandmother   . Diabetes Paternal Grandmother     Social History   Socioeconomic History  . Marital status: Single    Spouse name: Not on file  . Number of children: Not on file  . Years of education: Not on file  . Highest education level: Not on file  Occupational History  . Not on file  Tobacco Use  . Smoking status: Never Smoker  . Smokeless tobacco: Never Used  Vaping Use  . Vaping Use: Never used  Substance and Sexual Activity  . Alcohol use: No  . Drug use: No  . Sexual activity: Not Currently  Other Topics Concern  . Not on file  Social History Narrative  . Not on file   Social Determinants of Health   Financial Resource Strain:   . Difficulty of Paying Living Expenses:   Food Insecurity:   . Worried About Charity fundraiser in the Last Year:   . Arboriculturist in the Last Year:   Transportation Needs:   . Film/video editor (Medical):   Marland Kitchen Lack of Transportation (Non-Medical):   Physical Activity:   . Days of Exercise per Week:   . Minutes of Exercise per Session:   Stress:   . Feeling of Stress :   Social Connections:   . Frequency of Communication with Friends and Family:   . Frequency of Social Gatherings with Friends and Family:   . Attends Religious Services:   . Active Member of Clubs or  Organizations:   . Attends Archivist Meetings:   Marland Kitchen Marital Status:   Intimate Partner Violence:   . Fear of Current or Ex-Partner:   . Emotionally Abused:   Marland Kitchen Physically Abused:   . Sexually Abused:     Review of Systems:    Constitutional: No weight loss, fever or chills Skin: No rash  Cardiovascular: No chest pain   Respiratory: No SOB Gastrointestinal: See HPI and otherwise negative Genitourinary: No dysuria  Neurological: No headache, dizziness or syncope Musculoskeletal: No new muscle or joint pain Hematologic: No bruising Psychiatric: No history of depression or anxiety  Physical Exam:  Vital signs: BP 100/74   Pulse 72   Ht _0  (1.549 m)   Wt 127 lb (57.6 kg)   BMI 24.00 kg/m   Constitutional:   Pleasant Caucasian female appears to be in NAD, Well developed, Well nourished, alert and cooperative Head:  Normocephalic and atraumatic. Eyes:   PEERL, EOMI. No icterus. Conjunctiva pink. Ears:  Normal auditory acuity. Neck:  Supple Throat: Oral cavity and pharynx without inflammation, swelling or lesion.  Respiratory: Respirations even and unlabored. Lungs clear to auscultation bilaterally.   No wheezes, crackles, or rhonchi.  Cardiovascular: Normal S1, S2. No MRG. Regular rate and rhythm. No peripheral edema, cyanosis or pallor.  Gastrointestinal:  Soft, nondistended, nontender. No rebound or guarding. Normal bowel sounds. No appreciable masses or hepatomegaly. Rectal:  Not performed.  Msk:  Symmetrical without gross deformities. Without edema, no deformity or joint abnormality.  Neurologic:  Alert and  oriented x4;  grossly normal neurologically.  Skin:   Dry and intact without significant lesions or rashes. Psychiatric:  Demonstrates good judgement and reason without abnormal affect or behaviors.  RELEVANT LABS AND IMAGING: CBC    Component Value Date/Time   WBC 4.1 01/08/2020 1210   WBC 6.6 12/23/2019 2155   RBC 4.90 01/08/2020 1210   HGB 13.3  01/08/2020 1210   HCT 42.2 01/08/2020 1210   PLT 62 (L) 01/08/2020 1210   MCV 86.1 01/08/2020 1210   MCH 27.1 01/08/2020 1210   MCHC 31.5 01/08/2020 1210   RDW 12.4 01/08/2020 1210   LYMPHSABS 1.2 01/08/2020 1210   MONOABS 0.4 01/08/2020 1210   EOSABS 0.0 01/08/2020 1210   BASOSABS 0.0 01/08/2020 1210    CMP     Component Value Date/Time   NA 141 01/08/2020 1210   K 4.1 01/08/2020 1210   CL 109 01/08/2020 1210   CO2 23 01/08/2020 1210   GLUCOSE 92 01/08/2020 1210   BUN 21 (H) 01/08/2020 1210   CREATININE 0.80 01/08/2020 1210   CALCIUM 9.7 01/08/2020 1210   PROT 7.5 01/08/2020 1210   ALBUMIN 4.3 01/08/2020 1210   AST 21 01/08/2020 1210   ALT 11 01/08/2020 1210   ALKPHOS 80 01/08/2020 1210   BILITOT 0.3 01/08/2020 1210   GFRNONAA >60 01/08/2020 1210   GFRAA >60 01/08/2020 1210    Assessment: 1.  Rectal bleeding: Does have history of metastatic brain cancer, bleeding occurred after episodes of increased stool frequency, none over the past couple of weeks ; consider hemorrhoids versus other  2.  Lower abdominal cramping: With above, consider IBS versus medication side effect versus other 3.  Heartburn/eructations: Chronic for the patient, minimally helped with Tagamet  Plan: 1.  Recommend the patient do a trial of Omeprazole 20 mg daily, 30-60 minutes before breakfast for 2 weeks.  She should buy this over-the-counter.  If this does not completely resolve her symptoms then we can talk about a stronger dose of medicine for a longer time. 2.  Scheduled the patient for colonoscopy in the [place] with Dr. Tarri Glenn whom she requested.  Did discuss risks, benefits, limitations and alternatives and the patient agrees to proceed.  Patient will be Covid tested 2 days prior to time of procedure. 3.  Patient to follow in clinic per recommendations from Dr. Tarri Glenn after time of procedure.  Ellouise Newer, PA-C [facility] 01/30/2020, 2:23 PM  Cc: [name], NP

## 2020-01-30 NOTE — Patient Instructions (Addendum)
If you are age 51 or older, your body mass index should be between 23-30. Your Body mass index is 24 kg/m. If this is out of the aforementioned range listed, please consider follow up with your Primary Care Provider.  If you are age 69 or younger, your body mass index should be between 19-25. Your Body mass index is 24 kg/m. If this is out of the aformentioned range listed, please consider follow up with your Primary Care Provider.   Try Omeprazole 20 mg OTC daily 30-60 minutes before breakfast for 2 weeks.   Follow up in clinic per recommendations from Dr. Tarri Glenn.

## 2020-01-31 ENCOUNTER — Encounter: Payer: Self-pay | Admitting: *Deleted

## 2020-02-11 MED FILL — SOLIFENACIN SUCCINATE 5 MG: 5 | 30 days supply | Qty: 30 | Fill #1

## 2020-02-12 ENCOUNTER — Other Ambulatory Visit: Payer: Self-pay | Admitting: Radiation Oncology

## 2020-02-12 ENCOUNTER — Other Ambulatory Visit: Payer: Self-pay | Admitting: Physician Assistant

## 2020-02-12 ENCOUNTER — Encounter: Payer: Self-pay | Admitting: Radiation Oncology

## 2020-02-12 NOTE — Telephone Encounter (Signed)
Anderson Malta - did you want patient to discontinue Tagamet when starting omeprazole?  Received a refill request for Tagamet. Please advise. thx

## 2020-02-12 NOTE — Progress Notes (Signed)
Received refill request for Tagamet. Denies request to refill. Per Ellouise Newer, PA-C of  GI the patient only obtained minimal relief with Tagamet thus Omeprazole was recommended.

## 2020-02-13 ENCOUNTER — Telehealth: Payer: Self-pay

## 2020-02-13 NOTE — Telephone Encounter (Signed)
Okay, do not worry about sending in another H2 blocker such as Pepcid.  If she continues with just burping she can try using Gas-X before meals.  Thanks, JLL

## 2020-02-13 NOTE — Telephone Encounter (Signed)
Called and spoke to patient.   Relayed recommendation to try Gas-X before meals.  She expressed understanding.

## 2020-02-13 NOTE — Telephone Encounter (Signed)
Rec'd notification from [facility] that cimetidine (Tagamet) requires a PA. It is over $100 since it is not covered by her insurance (Medicaid). Called and spoke to patient.  She said she is not particularly interested in continuing Tagamet since it was not really helpful. But she would really like something for the burping.  Please advise.

## 2020-02-19 ENCOUNTER — Ambulatory Visit
Admission: RE | Admit: 2020-02-19 | Discharge: 2020-02-19 | Disposition: A | Discharge status: Home / Self Care | Payer: Medicaid Other | Source: Ambulatory Visit | Attending: Nurse Practitioner | Admitting: Nurse Practitioner

## 2020-02-19 ENCOUNTER — Other Ambulatory Visit: Payer: Self-pay

## 2020-02-19 DIAGNOSIS — N6489 Other specified disorders of breast: Secondary | ICD-10-CM

## 2020-02-25 NOTE — Progress Notes (Signed)
Reviewed and agree with management plans. ? ?Modesty L. Shareena Nusz, MD, MPH  ?

## 2020-03-17 ENCOUNTER — Ambulatory Visit (INDEPENDENT_AMBULATORY_CARE_PROVIDER_SITE_OTHER): Payer: Medicaid Other

## 2020-03-17 ENCOUNTER — Other Ambulatory Visit: Payer: Self-pay | Admitting: Gastroenterology

## 2020-03-17 ENCOUNTER — Telehealth: Payer: Self-pay | Admitting: Physician Assistant

## 2020-03-17 DIAGNOSIS — Z1159 Encounter for screening for other viral diseases: Secondary | ICD-10-CM

## 2020-03-17 LAB — SARS CORONAVIRUS 2 (TAT 6-24 HRS): SARS Coronavirus 2: NEGATIVE

## 2020-03-17 MED ORDER — SUTAB 1479-225-188 MG PO TABS: 1.0000 | ORAL_TABLET | Freq: Once | ORAL | 0 refills | Status: AC

## 2020-03-17 MED FILL — SUTAB 1479-225-188 MG TABS: 1479-225-18 | 1 days supply | Qty: 24 | Fill #0

## 2020-03-17 NOTE — Telephone Encounter (Signed)
Script sent to pharmacy. Left message informing patient.

## 2020-03-17 NOTE — Telephone Encounter (Signed)
Patient called would like for the prep medication to be sent to the [facility] outpatient pharmacy and requested a call once done so she can pick it up

## 2020-03-18 ENCOUNTER — Telehealth: Payer: Self-pay | Admitting: Nurse Practitioner

## 2020-03-18 NOTE — Telephone Encounter (Signed)
Pt is requesting a call back from [name] in regards to the message she sent to her yesterday

## 2020-03-19 ENCOUNTER — Other Ambulatory Visit: Payer: Self-pay

## 2020-03-19 ENCOUNTER — Ambulatory Visit (AMBULATORY_SURGERY_CENTER): Payer: Medicaid Other | Admitting: Gastroenterology

## 2020-03-19 ENCOUNTER — Telehealth: Payer: Self-pay | Admitting: Gastroenterology

## 2020-03-19 ENCOUNTER — Encounter: Payer: Self-pay | Admitting: Gastroenterology

## 2020-03-19 VITALS — BP 125/78 | HR 61 | Temp 97.8°F | Resp 19 | Ht 61.0 in | Wt 127.0 lb

## 2020-03-19 DIAGNOSIS — K648 Other hemorrhoids: Secondary | ICD-10-CM

## 2020-03-19 DIAGNOSIS — K625 Hemorrhage of anus and rectum: Secondary | ICD-10-CM

## 2020-03-19 MED ORDER — SODIUM CHLORIDE 0.9 % IV SOLN: 500.0000 mL | INTRAVENOUS | Status: DC

## 2020-03-19 NOTE — Telephone Encounter (Signed)
Pt called inquiring about the billing codes that will be used for her colonoscopy that is scheduled this afternoon at 4:00pm. She needs to know this to provide it to Medicaid before her procedure today. She is trying to find out coverage. Pls call her.

## 2020-03-19 NOTE — Telephone Encounter (Signed)
See note below, per [place] Amy this pt had no amb referral in epic and had procedure done today at 4pm.

## 2020-03-19 NOTE — Telephone Encounter (Signed)
Called the patient to discuss this with her. No answer. Got her voicemail. All of her communications have been through the patient portal. I have sent a response to her inquiry by patient message.

## 2020-03-19 NOTE — Telephone Encounter (Signed)
I recommend that she try to stick to the 10am time as closely as possible to maximize a successful prep. I would hate for her prep not to be complete by the time of her procedure and to have an incomplete exam because the entire colon could not be visualized. Thanks.

## 2020-03-19 NOTE — Progress Notes (Signed)
Report to PACU, RN, vss, BBS= Clear.  

## 2020-03-19 NOTE — Telephone Encounter (Signed)
I will address with the person that scheduled the procedure.  Amy does it require pre-cert?  Will this be covered?

## 2020-03-19 NOTE — Telephone Encounter (Signed)
Dr Tarri Glenn How long can the patient wait to take the final dose of her prep, which is Sutab. She has been instructed to take the tablets 6 hours prior to the appointment which is a 4pm procedure. She has sent [place] the following message in regards to this,  " I'm concerned about the recommended time the next day of 10am.  I'm not a morning person and not necessarily up by that time.  I'm wondering about pushing up that time to 11am at the earliest the day of the procedure.  In addition, I am wondering whether you know the approximate time of it on Wednesday.  Thank you!

## 2020-03-19 NOTE — Patient Instructions (Signed)
YOU HAD AN ENDOSCOPIC PROCEDURE TODAY AT [facility]:   Refer to the procedure report that was given to you for any specific questions about what was found during the examination.  If the procedure report does not answer your questions, please call your gastroenterologist to clarify.  If you requested that your care partner not be given the details of your procedure findings, then the procedure report has been included in a sealed envelope for you to review at your convenience later.  YOU SHOULD EXPECT: Some feelings of bloating in the abdomen. Passage of more gas than usual.  Walking can help get rid of the air that was put into your GI tract during the procedure and reduce the bloating. If you had a lower endoscopy (such as a colonoscopy or flexible sigmoidoscopy) you may notice spotting of blood in your stool or on the toilet paper. If you underwent a bowel prep for your procedure, you may not have a normal bowel movement for a few days.  Please Note:  You might notice some irritation and congestion in your nose or some drainage.  This is from the oxygen used during your procedure.  There is no need for concern and it should clear up in a day or so.  SYMPTOMS TO REPORT IMMEDIATELY:   Following lower endoscopy (colonoscopy or flexible sigmoidoscopy):  Excessive amounts of blood in the stool  Significant tenderness or worsening of abdominal pains  Swelling of the abdomen that is new, acute  Fever of 100F or higher    For urgent or emergent issues, a gastroenterologist can be reached at any hour by calling [phone]. Do not use MyChart messaging for urgent concerns.    DIET:  We do recommend a small meal at first, but then you may proceed to your regular diet.  Drink plenty of fluids but you should avoid alcoholic beverages for 24 hours.  ACTIVITY:  You should plan to take it easy for the rest of today and you should NOT DRIVE or use heavy machinery until tomorrow  (because of the sedation medicines used during the test).    FOLLOW UP: Our staff will call the number listed on your records 48-72 hours following your procedure to check on you and address any questions or concerns that you may have regarding the information given to you following your procedure. If we do not reach you, we will leave a message.  We will attempt to reach you two times.  During this call, we will ask if you have developed any symptoms of COVID 19. If you develop any symptoms (ie: fever, flu-like symptoms, shortness of breath, cough etc.) before then, please call [phone].  If you test positive for Covid 19 in the 2 weeks post procedure, please call and report this information to [place].    If any biopsies were taken you will be contacted by phone or by letter within the next 1-3 weeks.  Please call us at [phone] if you have not heard about the biopsies in 3 weeks.    SIGNATURES/CONFIDENTIALITY: You and/or your care partner have signed paperwork which will be entered into your electronic medical record.  These signatures attest to the fact that that the information above on your After Visit Summary has been reviewed and is understood.  Full responsibility of the confidentiality of this discharge information lies with you and/or your care-partner.  Resume medications. Information given on hemorrhoids and high fiber diet. Dr. Tarri Glenn verbally instructed patient to  buy over the counter anusol to apply to hemorrhoids.

## 2020-03-19 NOTE — Progress Notes (Signed)
Vs KW Pt's states no medical or surgical changes since previsit or office visit.

## 2020-03-19 NOTE — Op Note (Addendum)
[place] Patient Name: Kaitlyn Duncan Procedure Date: 03/19/2020 3:32 PM MRN: [id number] Endoscopist: Thornton Park MD, MD Age: 49 Referring MD:  Date of Birth: 1970/07/14 Gender: Female Account #: [id number] Procedure:                Colonoscopy Indications:              Rectal bleeding,                           No known family history of colon cancer or polyps Medicines:                Monitored Anesthesia Care Procedure:                Pre-Anesthesia Assessment:                           - Prior to the procedure, a History and Physical                            was performed, and patient medications and                            allergies were reviewed. The patient's tolerance of                            previous anesthesia was also reviewed. The risks                            and benefits of the procedure and the sedation                            options and risks were discussed with the patient.                            All questions were answered, and informed consent                            was obtained. Prior Anticoagulants: The patient has                            taken no previous anticoagulant or antiplatelet                            agents. ASA Grade Assessment: II - A patient with                            mild systemic disease. After reviewing the risks                            and benefits, the patient was deemed in                            satisfactory condition to undergo the procedure.  After obtaining informed consent, the colonoscope                            was passed under direct vision. Throughout the                            procedure, the patient's blood pressure, pulse, and                            oxygen saturations were monitored continuously. The                            Colonoscope was introduced through the anus and                            advanced to the 3 cm into the ileum. The                             colonoscopy was technically difficult and complex                            due to restricted mobility of the colon. Successful                            completion of the procedure was aided by changing                            the patient's position, withdrawing the scope and                            replacing with the pediatric colonoscope and                            applying abdominal pressure. The patient tolerated                            the procedure well. The quality of the bowel                            preparation was good. The terminal ileum, ileocecal                            valve, appendiceal orifice, and rectum were                            photographed. Scope In: 3:50:02 PM Scope Out: 4:04:18 PM Scope Withdrawal Time: 0 hours 10 minutes 35 seconds  Total Procedure Duration: 0 hours 14 minutes 16 seconds  Findings:                 Non-bleeding external and internal hemorrhoids were                            found.  The recto-sigmoid colon was moderately tortuous.                           The exam was otherwise without abnormality on                            direct and retroflexion views. Complications:            No immediate complications. Estimated Blood Loss:     Estimated blood loss: none. Impression:               - Non-bleeding external and internal hemorrhoids -                            the source of rectal bleeding.                           - Tortuous colon.                           - The examination was otherwise normal on direct                            and retroflexion views.                           - No specimens collected. Recommendation:           - Patient has a contact number available for                            emergencies. The signs and symptoms of potential                            delayed complications were discussed with the                            patient.  Return to normal activities tomorrow.                            Written discharge instructions were provided to the                            patient.                           - Resume previous diet. High fiber diet                            recommended. Drink plenty of water. Consider adding                            a daily stool bulking agent such as Metamucil,                            Benefiber, or Citrucel.                           -  Start using Anusol HC 2.5% applied sparingly to                            your rectum twice daily.                           - Continue present medications.                           - Repeat colonoscopy in 10 years for screening                            purposes, earlier with any new symptoms.                           - Emerging evidence supports eating a diet of                            fruits, vegetables, grains, calcium, and yogurt                            while reducing red meat and alcohol may reduce the                            risk of colon cancer.                           - Thank you for allowing me to be involved in your                            colon cancer prevention.                           - If you would like more information,                            MyGIHealth.[email or web address] and UpToDate.com have good                            information about hemorrhoids. Thornton Park MD, MD 03/19/2020 4:13:16 PM This report has been signed electronically.

## 2020-03-19 NOTE — Telephone Encounter (Signed)
Amy do you know what codes she is requesting?

## 2020-03-19 NOTE — Telephone Encounter (Signed)
Pt has already had procedure done.

## 2020-03-19 NOTE — Telephone Encounter (Signed)
Looks like there was no ambulatory referral put in.  The front desk knows a colonoscopy is [phone], but they prob want the dx codes.

## 2020-03-20 NOTE — Telephone Encounter (Signed)
She has [place] Medicaid so there is no precert required.  According to her response history from Medicaid, it shows $3 copay.

## 2020-03-21 ENCOUNTER — Telehealth: Payer: Self-pay

## 2020-03-21 ENCOUNTER — Other Ambulatory Visit: Payer: Self-pay | Admitting: *Deleted

## 2020-03-21 ENCOUNTER — Other Ambulatory Visit: Payer: Self-pay

## 2020-03-21 MED ORDER — PANTOPRAZOLE SODIUM 40 MG PO TBEC: 40.0000 mg | DELAYED_RELEASE_TABLET | Freq: Two times a day (BID) | ORAL | 1 refills | Status: DC

## 2020-03-21 MED FILL — PANTOPRAZOLE SOD DR 40 MG T: 40 | 30 days supply | Qty: 60 | Fill #0

## 2020-03-21 NOTE — Telephone Encounter (Signed)
First attempt follow up call to pt, lm on vm 

## 2020-03-21 NOTE — Telephone Encounter (Signed)
Second attempt follow up call to pt, lm on vm. 

## 2020-03-22 MED FILL — SOLIFENACIN SUCCINATE 5 MG: 5 | 30 days supply | Qty: 30 | Fill #2

## 2020-03-25 ENCOUNTER — Ambulatory Visit: Payer: Medicaid Other | Admitting: Internal Medicine

## 2020-04-01 ENCOUNTER — Other Ambulatory Visit: Payer: Medicaid Other

## 2020-04-02 ENCOUNTER — Ambulatory Visit
Admission: RE | Admit: 2020-04-02 | Discharge: 2020-04-02 | Disposition: A | Discharge status: Home / Self Care | Payer: Medicaid Other | Source: Ambulatory Visit | Attending: Internal Medicine | Admitting: Internal Medicine

## 2020-04-02 ENCOUNTER — Other Ambulatory Visit: Payer: Self-pay

## 2020-04-02 DIAGNOSIS — C7931 Secondary malignant neoplasm of brain: Secondary | ICD-10-CM

## 2020-04-02 MED ORDER — GADOBENATE DIMEGLUMINE 529 MG/ML IV SOLN
10.0000 mL | Freq: Once | INTRAVENOUS | Status: AC | PRN
  Administered 2020-04-02: 10 mL via INTRAVENOUS

## 2020-04-08 ENCOUNTER — Inpatient Hospital Stay: Payer: Medicaid Other | Attending: Obstetrics and Gynecology | Admitting: Internal Medicine

## 2020-04-08 ENCOUNTER — Other Ambulatory Visit: Payer: Self-pay

## 2020-04-08 VITALS — BP 144/94 | HR 60 | Temp 97.8°F | Resp 18 | Ht 61.0 in | Wt 124.8 lb

## 2020-04-08 DIAGNOSIS — C439 Malignant melanoma of skin, unspecified: Secondary | ICD-10-CM | POA: Diagnosis present

## 2020-04-08 DIAGNOSIS — Z803 Family history of malignant neoplasm of breast: Secondary | ICD-10-CM | POA: Diagnosis not present

## 2020-04-08 DIAGNOSIS — D696 Thrombocytopenia, unspecified: Secondary | ICD-10-CM | POA: Insufficient documentation

## 2020-04-08 DIAGNOSIS — C7931 Secondary malignant neoplasm of brain: Secondary | ICD-10-CM | POA: Diagnosis present

## 2020-04-08 NOTE — Progress Notes (Signed)
[facility] at [place], [place] [address] [phone]   Interval Evaluation  Date of Service: 04/08/20 Patient Name: Kaitlyn Duncan Patient MRN: [id number] Patient DOB: 17-Jun-1971 Provider: Ventura Sellers, MD  Identifying Statement:  Kaitlyn Duncan is a 49 y.o. female with Brain metastasis ([place]) [C79.31]     Primary Cancer:  Oncologic History: Oncology History  Melanoma of skin ([place])  05/29/2018 Initial Diagnosis   Melanoma of skin ([place])   06/05/2018 -  Chemotherapy   The patient had pembrolizumab (KEYTRUDA) 200 mg in sodium chloride 0.9 % 50 mL chemo infusion, 200 mg, Intravenous, Once, 12 of 12 cycles Administration: 200 mg (06/05/2018), 200 mg (06/26/2018), 200 mg (07/17/2018), 200 mg (08/07/2018), 200 mg (08/28/2018), 200 mg (09/18/2018), 200 mg (10/09/2018), 200 mg (10/30/2018), 200 mg (11/20/2018), 200 mg (12/11/2018), 200 mg (01/01/2019), 200 mg (01/22/2019)  for chemotherapy treatment.     CNS Oncologic History 02/17/18: Completes WBRT for innumerable metastases  Interval History:  Kaitlyn Duncan presents today for follow up after recent MRI brain. No further headaches.  She still describes impaired short term memory, "slow thinking".  She is excited by recent return to work, part time as Scientist, water quality.  H+P (09/06/18) Patient presents today due to her concern over her anti-epileptic drug regimen.  She has been taking Keppra 106m twice per day since July 2019, when she was initially diagnosed with diffuse brain metastases secondary to melanoma.  She denies ever experiencing a seizure.  Her gait has been normal/steady, she has mild tension headaches on occasion only.  There is no history of epilepsy in the family, no prior neurotrauma.  Melanoma has been managed for ~1 year with keytruda monotherapy.  Medications: Current Outpatient Medications on File Prior to Visit  Medication Sig Dispense Refill  . Acetaminophen  500 MG capsule Take 1,000 mg by mouth every 6 (six) hours as needed.     . COLLAGEN PO Take by mouth.    . dicyclomine (BENTYL) 20 MG tablet Take 1 tablet (20 mg total) by mouth 2 (two) times daily. (Patient not taking: Reported on 03/19/2020) 20 tablet 0  . fluticasone (FLONASE) 50 MCG/ACT nasal spray Place 1 spray into both nostrils daily. 9.9 g 2  . Lactobacillus (PROBIOTIC ACIDOPHILUS PO) Take 2 Doses by mouth daily.     .Marland Kitchenloratadine (CLARITIN) 10 MG tablet Take 10 mg by mouth daily.    . Milk Thistle 175 MG CAPS Take 175 mg by mouth daily.    . Multiple Vitamin (MULTIVITAMIN WITH MINERALS) TABS tablet Take 1 tablet by mouth daily.    . mupirocin ointment (BACTROBAN) 2 % APPLY INTO THE NOSE AS DIRECTED TWICE DAILY (Patient not taking: Reported on 03/19/2020) 22 g 1  . pantoprazole (PROTONIX) 40 MG tablet Take 1 tablet (40 mg total) by mouth 2 (two) times daily. 60 tablet 1  . solifenacin (VESICARE) 5 MG tablet Take 1 tablet (5 mg total) by mouth daily. 30 tablet 5  . TURMERIC PO Take by mouth.     No current facility-administered medications on file prior to visit.    Allergies: No Known Allergies Past Medical History:  Past Medical History:  Diagnosis Date  . Anemia   . Anemia   . Diabetes mellitus without complication (HCC)    prediabetic  . GERD (gastroesophageal reflux disease)   . melanoma with met dz dx'd 01/2018   brain and adrenal  . Seasonal allergies  Past Surgical History:  Past Surgical History:  Procedure Laterality Date  . BIOPSY  01/28/2018   Procedure: BIOPSY;  Surgeon: Laurence Spates, MD;  Location: [facility];  Service: Endoscopy;;  . BREAST BIOPSY Left   . ESOPHAGOGASTRODUODENOSCOPY N/A 01/28/2018   Procedure: ESOPHAGOGASTRODUODENOSCOPY (EGD);  Surgeon: Laurence Spates, MD;  Location: [facility];  Service: Endoscopy;  Laterality: N/A;  . MOUTH SURGERY    . OVARY SURGERY     removed "something"   Social History:  Social History   Socioeconomic History   . Marital status: Single    Spouse name: Not on file  . Number of children: Not on file  . Years of education: Not on file  . Highest education level: Not on file  Occupational History  . Not on file  Tobacco Use  . Smoking status: Never Smoker  . Smokeless tobacco: Never Used  Vaping Use  . Vaping Use: Never used  Substance and Sexual Activity  . Alcohol use: No  . Drug use: No  . Sexual activity: Not Currently  Other Topics Concern  . Not on file  Social History Narrative  . Not on file   Social Determinants of Health   Financial Resource Strain:   . Difficulty of Paying Living Expenses: Not on file  Food Insecurity:   . Worried About Charity fundraiser in the Last Year: Not on file  . Ran Out of Food in the Last Year: Not on file  Transportation Needs:   . Lack of Transportation (Medical): Not on file  . Lack of Transportation (Non-Medical): Not on file  Physical Activity:   . Days of Exercise per Week: Not on file  . Minutes of Exercise per Session: Not on file  Stress:   . Feeling of Stress : Not on file  Social Connections:   . Frequency of Communication with Friends and Family: Not on file  . Frequency of Social Gatherings with Friends and Family: Not on file  . Attends Religious Services: Not on file  . Active Member of Clubs or Organizations: Not on file  . Attends Archivist Meetings: Not on file  . Marital Status: Not on file  Intimate Partner Violence:   . Fear of Current or Ex-Partner: Not on file  . Emotionally Abused: Not on file  . Physically Abused: Not on file  . Sexually Abused: Not on file   Family History:  Family History  Problem Relation Age of Onset  . Hypertension Father   . Breast cancer Maternal Aunt   . Diabetes Maternal Aunt   . Breast cancer Paternal Aunt   . Breast cancer Maternal Grandmother   . Breast cancer Paternal Grandmother   . Diabetes Paternal Grandmother   . Melanoma Mother     Review of  Systems: Constitutional: Denies fevers, chills or abnormal weight loss Eyes: Denies blurriness of vision Ears, nose, mouth, throat, and face: Denies mucositis or sore throat Respiratory: Denies cough, dyspnea or wheezes Cardiovascular: Denies palpitation, chest discomfort or lower extremity swelling Gastrointestinal:  Denies nausea, constipation, diarrhea GU: Denies dysuria or incontinence Skin: Denies abnormal skin rashes Neurological: Per HPI Musculoskeletal: Denies joint pain, back or neck discomfort. No decrease in ROM Behavioral/Psych: Denies anxiety, disturbance in thought content, and mood instability   Physical Exam: Vitals:   04/08/20 1147  BP: (!) 144/94  Pulse: 60  Resp: 18  Temp: 97.8 F (36.6 C)  SpO2: 100%   KPS: 90. General: Alert, cooperative, pleasant, in no  acute distress Head: Craniotomy scar noted, dry and intact. EENT: No conjunctival injection or scleral icterus. Oral mucosa moist Lungs: Resp effort normal Cardiac: Regular rate and rhythm Abdomen: Soft, non-distended abdomen Skin: No rashes cyanosis or petechiae. Extremities: No clubbing or edema  Neurologic Exam: Mental Status: Awake, alert, attentive to examiner. Oriented to self and environment. Language is fluent with intact comprehension. Age advanced psychomotor slowing, delayed object recall Cranial Nerves: Visual acuity is grossly normal. Visual fields are full. Extra-ocular movements intact. No ptosis. Face is symmetric, tongue midline. Motor: Tone and bulk are normal. Power is full in both arms and legs. Reflexes are symmetric, no pathologic reflexes present. Intact finger to nose bilaterally Sensory: Intact to light touch and temperature Gait: Normal and tandem gait is normal.   Labs: I have reviewed the data as listed    Component Value Date/Time   NA 141 01/08/2020 1210   K 4.1 01/08/2020 1210   CL 109 01/08/2020 1210   CO2 23 01/08/2020 1210   GLUCOSE 92 01/08/2020 1210   BUN 21  (H) 01/08/2020 1210   CREATININE 0.80 01/08/2020 1210   CALCIUM 9.7 01/08/2020 1210   PROT 7.5 01/08/2020 1210   ALBUMIN 4.3 01/08/2020 1210   AST 21 01/08/2020 1210   ALT 11 01/08/2020 1210   ALKPHOS 80 01/08/2020 1210   BILITOT 0.3 01/08/2020 1210   GFRNONAA >60 01/08/2020 1210   GFRAA >60 01/08/2020 1210   Lab Results  Component Value Date   WBC 4.1 01/08/2020   NEUTROABS 2.5 01/08/2020   HGB 13.3 01/08/2020   HCT 42.2 01/08/2020   MCV 86.1 01/08/2020   PLT 62 (L) 01/08/2020    Imaging:  MR BRAIN W WO CONTRAST  Result Date: 04/03/2020 CLINICAL DATA:  Metastatic melanoma.  Treatment follow-up. EXAM: MRI HEAD WITHOUT AND WITH CONTRAST TECHNIQUE: Multiplanar, multiecho pulse sequences of the brain and surrounding structures were obtained without and with intravenous contrast. CONTRAST:  89m MULTIHANCE GADOBENATE DIMEGLUMINE 529 MG/ML IV SOLN COMPARISON:  Brain MRI 11/15/2019 FINDINGS: Brain: No acute infarct, acute hemorrhage or extra-axial collection. Diffuse confluent hyperintense T2-weighted signal within the periventricular, deep and juxtacortical white matter. Normal volume of CSF spaces. Numerous foci of magnetic susceptibility effect consistent with chronic microhemorrhage scattered throughout both hemispheres. Normal midline structures. Unchanged lesions of the ventral medial right thalamus and at the right parieto-occipital region (series 13 images 84 and 90). Multiple other punctate nonenhancing lesions are again evident only by magnetic susceptibility effects. Vascular: Normal flow voids. Skull and upper cervical spine: Normal marrow signal. Sinuses/Orbits: Chronic left maxillary sinus disease. Normal orbits. Other: None IMPRESSION: 1. Unchanged examination with persistent mild contrast enhancement of lesions of the ventral medial right thalamus and right parieto-occipital junction. Other punctate foci of magnetic susceptibility effect without associated enhancement are  unchanged. No new lesions. 2. Chronic white matter changes and multifocal microhemorrhage consistent with prior radiation therapy. 1. Electronically Signed   By: KUlyses JarredM.D.   On: 04/03/2020 02:02    [place]in[place] Interpretation: I have personally reviewed the radiological images as listed.  My interpretation, in the context of the patient's clinical presentation, is stable disease   Assessment/Plan 1. Brain metastasis ([place]  Ms. FHorsfordis clinically and radiographically stable today.  MRI re-demonstrates leukomalacia and atrophy noted prior.  We again counseled her on cognitive decline related to radiation, and encouraged her to maintain physical and mental activity, as well as positive health outlook.  We ask that Kaitlyn Keasreturn to  clinic in 6 months following next brain MRI, or sooner as needed.  We appreciate the opportunity to participate in the care of [facility].   All questions were answered. The patient knows to call the clinic with any problems, questions or concerns. No barriers to learning were detected.  I have spent a total of 30 minutes of face-to-face and non-face-to-face time, excluding clinical staff time, preparing to see patient, ordering tests and/or medications, counseling the patient, and independently interpreting results and communicating results to the patient/family/caregiver    Ventura Sellers, MD Medical Director of Neuro-Oncology [facility] at [place] 04/08/20 11:49 AM

## 2020-04-09 ENCOUNTER — Telehealth: Payer: Self-pay | Admitting: Internal Medicine

## 2020-04-09 NOTE — Telephone Encounter (Signed)
Scheduled per 10/5 los. Unable to reach pt. Left voicemail with appt time and date.

## 2020-04-15 ENCOUNTER — Inpatient Hospital Stay: Payer: Medicaid Other

## 2020-04-15 ENCOUNTER — Other Ambulatory Visit: Payer: Self-pay

## 2020-04-15 ENCOUNTER — Inpatient Hospital Stay (HOSPITAL_BASED_OUTPATIENT_CLINIC_OR_DEPARTMENT_OTHER): Payer: Medicaid Other | Admitting: Oncology

## 2020-04-15 VITALS — BP 124/74 | HR 64 | Temp 97.8°F | Resp 18 | Ht 61.0 in | Wt 125.5 lb

## 2020-04-15 DIAGNOSIS — C439 Malignant melanoma of skin, unspecified: Secondary | ICD-10-CM

## 2020-04-15 DIAGNOSIS — C7931 Secondary malignant neoplasm of brain: Secondary | ICD-10-CM | POA: Diagnosis not present

## 2020-04-15 LAB — CMP (CANCER CENTER ONLY)
ALT: 12 U/L (ref 0–44)
AST: 21 U/L (ref 15–41)
Albumin: 4.3 g/dL (ref 3.5–5.0)
Alkaline Phosphatase: 89 U/L (ref 38–126)
Anion gap: 6 (ref 5–15)
BUN: 13 mg/dL (ref 6–20)
CO2: 30 mmol/L (ref 22–32)
Calcium: 9.7 mg/dL (ref 8.9–10.3)
Chloride: 105 mmol/L (ref 98–111)
Creatinine: 0.76 mg/dL (ref 0.44–1.00)
GFR, Estimated: >60 mL/min (ref >60)
Glucose, Bld: 111 mg/dL — ABNORMAL HIGH (ref 70–99)
Potassium: 3.8 mmol/L (ref 3.5–5.1)
Sodium: 141 mmol/L (ref 135–145)
Total Bilirubin: 0.4 mg/dL (ref 0.3–1.2)
Total Protein: 7.5 g/dL (ref 6.5–8.1)

## 2020-04-15 LAB — CBC WITH DIFFERENTIAL (CANCER CENTER ONLY)
Abs Immature Granulocytes: 0.01 10*3/uL (ref 0.00–0.07)
Basophils Absolute: 0 10*3/uL (ref 0.0–0.1)
Basophils Relative: 0 %
Eosinophils Absolute: 0 10*3/uL (ref 0.0–0.5)
Eosinophils Relative: 0 %
HCT: 40.9 % (ref 36.0–46.0)
Hemoglobin: 12.9 g/dL (ref 12.0–15.0)
Immature Granulocytes: 0 %
Lymphocytes Relative: 27 %
Lymphs Abs: 1 10*3/uL (ref 0.7–4.0)
MCH: 27.2 pg (ref 26.0–34.0)
MCHC: 31.5 g/dL (ref 30.0–36.0)
MCV: 86.3 fL (ref 80.0–100.0)
Monocytes Absolute: 0.4 10*3/uL (ref 0.1–1.0)
Monocytes Relative: 10 %
Neutro Abs: 2.5 10*3/uL (ref 1.7–7.7)
Neutrophils Relative %: 63 %
Platelet Count: 71 10*3/uL — ABNORMAL LOW (ref 150–400)
RBC: 4.74 MIL/uL (ref 3.87–5.11)
RDW: 12.1 % (ref 11.5–15.5)
WBC Count: 3.9 10*3/uL — ABNORMAL LOW (ref 4.0–10.5)
nRBC: 0 % (ref 0.0–0.2)

## 2020-04-15 NOTE — Addendum Note (Signed)
Addended by: Kennedy Bucker on: 04/15/2020 01:41 PM   Modules accepted: Orders

## 2020-04-15 NOTE — Progress Notes (Signed)
Hematology and Oncology Follow Up Visit  Kaitlyn Duncan [id number] 12-18-1970 49 y.o. 04/15/2020 12:52 PM Kaitlyn Lasso, NP   Principle Diagnosis: 49 year old woman with stage IV melanoma of unknown primary diagnosed in July 2019.  She was found to have diffuse metastatic disease with lymphadenopathy and peritoneal disease.     Prior Therapy: He is status post subcutaneous nodule biopsy on 01/27/2018 which confirmed the presence of metastatic melanoma. She status post endoscopy on 01/28/2018 which showed subcutaneous involvement that consistent with metastatic melanoma.  Whole brain radiation for total of 30 Gy in 10 fractions started on 02/07/2018.   Mektovi 45 mg bid, [name] 450 mg daily started in August 2019.  Therapy discontinued in November 2019 after partial response.   Pembrolizumab 200 mg every 3 weeks started on 06/05/2018.  She is status post 12 cycles completed in July 2020.    Current therapy: Active surveillance.    Interim History: Kaitlyn Duncan is here for return evaluation.  Since the last visit, he reports no major changes in her health.  She remains fairly active and attends to activities of daily living including working part-time.  She has reported intermittent back pain flank pain that has not been problematic.  No constitutional symptoms reported at this time.  Overall quality of life remained excellent.            .     Medications: Updated on review. Current Outpatient Medications  Medication Sig Dispense Refill  . Acetaminophen 500 MG capsule Take 1,000 mg by mouth every 6 (six) hours as needed.     . COLLAGEN PO Take by mouth.    . dicyclomine (BENTYL) 20 MG tablet Take 1 tablet (20 mg total) by mouth 2 (two) times daily. (Patient not taking: Reported on 03/19/2020) 20 tablet 0  . fluticasone (FLONASE) 50 MCG/ACT nasal spray Place 1 spray into both nostrils daily. 9.9 g 2  . Lactobacillus (PROBIOTIC ACIDOPHILUS PO) Take 2  Doses by mouth daily.     Marland Kitchen loratadine (CLARITIN) 10 MG tablet Take 10 mg by mouth daily.    . Milk Thistle 175 MG CAPS Take 175 mg by mouth daily.    . Multiple Vitamin (MULTIVITAMIN WITH MINERALS) TABS tablet Take 1 tablet by mouth daily.    . mupirocin ointment (BACTROBAN) 2 % APPLY INTO THE NOSE AS DIRECTED TWICE DAILY (Patient not taking: Reported on 03/19/2020) 22 g 1  . pantoprazole (PROTONIX) 40 MG tablet Take 1 tablet (40 mg total) by mouth 2 (two) times daily. 60 tablet 1  . solifenacin (VESICARE) 5 MG tablet Take 1 tablet (5 mg total) by mouth daily. 30 tablet 5  . TURMERIC PO Take by mouth.     No current facility-administered medications for this visit.     Allergies: No Known Allergies  .     Physical Exam:   Blood pressure 124/74, pulse 64, temperature 97.8 F (36.6 C), temperature source Tympanic, resp. rate 18, height 5\' 1"  (1.549 m), weight 125 lb 8 oz (56.9 kg), SpO2 100 %.      ECOG: 1    General appearance: Comfortable appearing without any discomfort Head: Normocephalic without any trauma Oropharynx: Mucous membranes are moist and pink without any thrush or ulcers. Eyes: Pupils are equal and round reactive to light. Lymph nodes: No cervical, supraclavicular, inguinal or axillary lymphadenopathy.   Heart:regular rate and rhythm.  S1 and S2 without leg edema. Lung: Clear without any rhonchi or wheezes.  No dullness to  percussion. Abdomin: Soft, nontender, nondistended with good bowel sounds.  No hepatosplenomegaly. Musculoskeletal: No joint deformity or effusion.  Full range of motion noted. Neurological: No deficits noted on motor, sensory and deep tendon reflex exam. Skin: No petechial rash or dryness.  Appeared moist.                   Lab Results: Lab Results  Component Value Date   WBC 4.1 01/08/2020   HGB 13.3 01/08/2020   HCT 42.2 01/08/2020   MCV 86.1 01/08/2020   PLT 62 (L) 01/08/2020     Chemistry      Component  Value Date/Time   NA 141 01/08/2020 1210   K 4.1 01/08/2020 1210   CL 109 01/08/2020 1210   CO2 23 01/08/2020 1210   BUN 21 (H) 01/08/2020 1210   CREATININE 0.80 01/08/2020 1210      Component Value Date/Time   CALCIUM 9.7 01/08/2020 1210   ALKPHOS 80 01/08/2020 1210   AST 21 01/08/2020 1210   ALT 11 01/08/2020 1210   BILITOT 0.3 01/08/2020 5362       49 year old woman with:  1.  Metastatic melanoma diagnosed in July 2019.  He was found to have widespread disease including peritoneal metastasis with unknown primary.   Risks and benefits of restarting systemic therapy were reviewed.  Imaging studies obtained in July 2021 were reiterated at this time.  I disease has been overall stable without any signs or symptoms of disease progression.  I recommended repeat imaging studies in the next 3 months and reevaluate the start of therapy.  That will be in the form of immunotherapy or oral targeted therapy.  She develops worsening symptoms in the interim we will obtain the scan sooner date.   2.  CNS metastasis: He is status post radiation therapy with MRI September 2021 showed no evidence of disease progression.  3.  Prognosis: Therapy remains palliative although aggressive measures are warranted given her excellent performance status.   4.  Thrombocytopenia: Remained stable without any active bleeding.  Differential diagnosis including ITP versus splenic sequestration.  5.  Follow-up: She will return in 3 months for repeat follow-up and imaging studies.  30  minutes were spent on this visit.  Time was dedicated to updating her disease status, discussing treatment options and future plan of care reviewed.   Kaitlyn Button, MD 10/12/202112:52 PM

## 2020-04-22 ENCOUNTER — Encounter: Payer: Self-pay | Admitting: Physician Assistant

## 2020-04-22 ENCOUNTER — Ambulatory Visit (INDEPENDENT_AMBULATORY_CARE_PROVIDER_SITE_OTHER): Payer: Medicaid Other | Admitting: Physician Assistant

## 2020-04-22 VITALS — BP 120/80 | HR 68 | Ht 61.0 in | Wt 125.4 lb

## 2020-04-22 DIAGNOSIS — R142 Eructation: Secondary | ICD-10-CM | POA: Diagnosis not present

## 2020-04-22 MED ORDER — OMEPRAZOLE 20 MG PO CPDR: 20.0000 mg | DELAYED_RELEASE_CAPSULE | Freq: Every day | ORAL | 2 refills | Status: DC

## 2020-04-22 NOTE — Progress Notes (Signed)
Chief Complaint: Eructations  HPI:    Kaitlyn Duncan is a 49 year old female with a past medical history as listed below including reflux, as well as metastatic melanoma with brain mets, known to Dr. Tarri Glenn, who presents to clinic today with a complaint of eructations.    Today, the patient tells me that she has had trouble with burping which seems excessive over the past 6 months or so.  Tells me that she has been taking gas relief medications prior to eating which does not really help at all.  This can happen after any meal including after she had a bowl of cereal this morning.  Does tell me though that she drinks out of straws all day long.  Denies chewing gum.  Denies epigastric pain or reflux.    Denies fever, chills, weight loss or change in bowel habits.  Past Medical History:  Diagnosis Date  . Anemia   . Anemia   . Diabetes mellitus without complication (HCC)    prediabetic  . GERD (gastroesophageal reflux disease)   . melanoma with met dz dx'd 01/2018   brain and adrenal  . Seasonal allergies     Past Surgical History:  Procedure Laterality Date  . BIOPSY  01/28/2018   Procedure: BIOPSY;  Surgeon: Laurence Spates, MD;  Location: [facility];  Service: Endoscopy;;  . BREAST BIOPSY Left   . ESOPHAGOGASTRODUODENOSCOPY N/A 01/28/2018   Procedure: ESOPHAGOGASTRODUODENOSCOPY (EGD);  Surgeon: Laurence Spates, MD;  Location: [facility];  Service: Endoscopy;  Laterality: N/A;  . MOUTH SURGERY    . OVARY SURGERY     removed "something"    Current Outpatient Medications  Medication Sig Dispense Refill  . Acetaminophen 500 MG capsule Take 1,000 mg by mouth every 6 (six) hours as needed.     . COLLAGEN PO Take by mouth.    . fluticasone (FLONASE) 50 MCG/ACT nasal spray Place 1 spray into both nostrils daily. 9.9 g 2  . Lactobacillus (PROBIOTIC ACIDOPHILUS PO) Take 2 Doses by mouth daily.     Marland Kitchen loratadine (CLARITIN) 10 MG tablet Take 10 mg by mouth daily.    . Milk Thistle 175 MG  CAPS Take 175 mg by mouth daily.    . Multiple Vitamin (MULTIVITAMIN WITH MINERALS) TABS tablet Take 1 tablet by mouth daily.    . solifenacin (VESICARE) 5 MG tablet Take 1 tablet (5 mg total) by mouth daily. 30 tablet 5  . TURMERIC PO Take by mouth.     No current facility-administered medications for this visit.    Allergies as of 04/22/2020  . (No Known Allergies)    Family History  Problem Relation Age of Onset  . Hypertension Father   . Breast cancer Maternal Aunt   . Diabetes Maternal Aunt   . Breast cancer Paternal Aunt   . Breast cancer Maternal Grandmother   . Breast cancer Paternal Grandmother   . Diabetes Paternal Grandmother   . Melanoma Mother     Social History   Socioeconomic History  . Marital status: Single    Spouse name: Not on file  . Number of children: Not on file  . Years of education: Not on file  . Highest education level: Not on file  Occupational History  . Not on file  Tobacco Use  . Smoking status: Never Smoker  . Smokeless tobacco: Never Used  Vaping Use  . Vaping Use: Never used  Substance and Sexual Activity  . Alcohol use: No  . Drug use: No  .  Sexual activity: Not Currently  Other Topics Concern  . Not on file  Social History Narrative  . Not on file   Social Determinants of Health   Financial Resource Strain:   . Difficulty of Paying Living Expenses: Not on file  Food Insecurity:   . Worried About Charity fundraiser in the Last Year: Not on file  . Ran Out of Food in the Last Year: Not on file  Transportation Needs:   . Lack of Transportation (Medical): Not on file  . Lack of Transportation (Non-Medical): Not on file  Physical Activity:   . Days of Exercise per Week: Not on file  . Minutes of Exercise per Session: Not on file  Stress:   . Feeling of Stress : Not on file  Social Connections:   . Frequency of Communication with Friends and Family: Not on file  . Frequency of Social Gatherings with Friends and Family:  Not on file  . Attends Religious Services: Not on file  . Active Member of Clubs or Organizations: Not on file  . Attends Archivist Meetings: Not on file  . Marital Status: Not on file  Intimate Partner Violence:   . Fear of Current or Ex-Partner: Not on file  . Emotionally Abused: Not on file  . Physically Abused: Not on file  . Sexually Abused: Not on file    Review of Systems:    Constitutional: No weight loss, fever or chills Cardiovascular: No chest pain   Respiratory: No SOB  Gastrointestinal: See HPI and otherwise negative   Physical Exam:  Vital signs: BP 120/80   Pulse 68   Ht _0  (1.549 m)   Wt 125 lb 6.4 oz (56.9 kg)   BMI 23.69 kg/m   Constitutional:   Pleasant Caucasian female appears to be in NAD, Well developed, Well nourished, alert and cooperative Respiratory: Respirations even and unlabored. Lungs clear to auscultation bilaterally.   No wheezes, crackles, or rhonchi.  Cardiovascular: Normal S1, S2. No MRG. Regular rate and rhythm. No peripheral edema, cyanosis or pallor.  Gastrointestinal:  Soft, nondistended, nontender. No rebound or guarding. Normal bowel sounds. No appreciable masses or hepatomegaly. Rectal:  Not performed.  Psychiatric: Demonstrates good judgement and reason without abnormal affect or behaviors.  RELEVANT LABS AND IMAGING: CBC    Component Value Date/Time   WBC 3.9 (L) 04/15/2020 1242   WBC 6.6 12/23/2019 2155   RBC 4.74 04/15/2020 1242   HGB 12.9 04/15/2020 1242   HCT 40.9 04/15/2020 1242   PLT 71 (L) 04/15/2020 1242   MCV 86.3 04/15/2020 1242   MCH 27.2 04/15/2020 1242   MCHC 31.5 04/15/2020 1242   RDW 12.1 04/15/2020 1242   LYMPHSABS 1.0 04/15/2020 1242   MONOABS 0.4 04/15/2020 1242   EOSABS 0.0 04/15/2020 1242   BASOSABS 0.0 04/15/2020 1242    CMP     Component Value Date/Time   NA 141 04/15/2020 1242   K 3.8 04/15/2020 1242   CL 105 04/15/2020 1242   CO2 30 04/15/2020 1242   GLUCOSE 111 (H) 04/15/2020  1242   BUN 13 04/15/2020 1242   CREATININE 0.76 04/15/2020 1242   CALCIUM 9.7 04/15/2020 1242   PROT 7.5 04/15/2020 1242   ALBUMIN 4.3 04/15/2020 1242   AST 21 04/15/2020 1242   ALT 12 04/15/2020 1242   ALKPHOS 89 04/15/2020 1242   BILITOT 0.4 04/15/2020 1242   GFRNONAA >60 04/15/2020 1242   GFRAA >60 01/08/2020 1210  Assessment: 1.  Eructations: Consider relation to reflux versus aerophagia versus other  Plan: 1.  Recommend the patient discontinue drinking out of straws.  Discussed how this can lead to more air in the stomach. 2.  Recommend the patient do a trial of Omeprazole 20 mg daily, 30-60 minutes before breakfast in the morning.  Prescribed #30 with 2 refills. 3.  Patient can discontinue gas relief products if these are not helping. 4.  Patient to follow in clinic with me in 6 to 8 weeks or sooner if necessary.  Ellouise Newer, PA-C [facility] 04/22/2020, 10:53 AM  Cc: [name], NP

## 2020-04-22 NOTE — Progress Notes (Signed)
Given her history of gastric manifestations of melanoma at the time of EGD 2019, I have a low threshold to repeat EGD with biopsies look for reflux, gastritis, and H pylori. Would also recommend avoiding dairy, carbonated beverages and beer, and sugar substitutes to see if we can minimize her symptoms. In addition, I recommend that she eat and drink more slowly and regularly take a walk after eating.  She may also find it helpful to use a food diary to carefully record what she eats and whether she experiences any gas. Thank you.

## 2020-04-22 NOTE — Patient Instructions (Addendum)
If you are age 51 or older, your body mass index should be between 23-30. Your Body mass index is 23.69 kg/m. If this is out of the aforementioned range listed, please consider follow up with your Primary Care Provider.  If you are age 34 or younger, your body mass index should be between 19-25. Your Body mass index is 23.69 kg/m. If this is out of the aformentioned range listed, please consider follow up with your Primary Care Provider.   We have sent the following medications to your pharmacy for you to pick up at your convenience:\ Omeprazole 20mg   Try to stop using straws.  Due to recent changes in healthcare laws, you may see the results of your imaging and laboratory studies on MyChart before your provider has had a chance to review them.  We understand that in some cases there may be results that are confusing or concerning to you. Not all laboratory results come back in the same time frame and the provider may be waiting for multiple results in order to interpret others.  Please give [place] 48 hours in order for your provider to thoroughly review all the results before contacting the office for clarification of your results.

## 2020-04-25 MED FILL — SOLIFENACIN SUCCINATE 5 MG: 5 | 30 days supply | Qty: 30 | Fill #3

## 2020-06-03 MED FILL — SOLIFENACIN SUCCINATE 5 MG: 5 | 30 days supply | Qty: 30 | Fill #4

## 2020-06-06 ENCOUNTER — Telehealth: Payer: Self-pay | Admitting: Oncology

## 2020-06-06 NOTE — Telephone Encounter (Signed)
R/s appt per 12/3 sch msg - no answer. Left message for patient with appt date and time

## 2020-06-11 ENCOUNTER — Other Ambulatory Visit: Payer: Self-pay | Admitting: Oncology

## 2020-06-11 DIAGNOSIS — R040 Epistaxis: Secondary | ICD-10-CM

## 2020-06-11 MED FILL — MUPIROCIN 2% OINTMENT: 2 | 10 days supply | Qty: 22 | Fill #0

## 2020-06-22 ENCOUNTER — Encounter: Payer: Self-pay | Admitting: Oncology

## 2020-06-22 ENCOUNTER — Encounter: Payer: Self-pay | Admitting: Internal Medicine

## 2020-07-09 MED FILL — SOLIFENACIN SUCCINATE 5 MG: 5 | 30 days supply | Qty: 30 | Fill #5

## 2020-07-11 ENCOUNTER — Telehealth: Payer: Self-pay | Admitting: Oncology

## 2020-07-11 NOTE — Telephone Encounter (Signed)
Called patient regarding upcoming appointments, left a voicemail. 

## 2020-07-16 ENCOUNTER — Inpatient Hospital Stay: Payer: Medicare Other | Attending: Obstetrics and Gynecology

## 2020-07-16 ENCOUNTER — Other Ambulatory Visit: Payer: Self-pay

## 2020-07-16 ENCOUNTER — Encounter (HOSPITAL_COMMUNITY): Payer: Self-pay

## 2020-07-16 ENCOUNTER — Ambulatory Visit (HOSPITAL_COMMUNITY)
Admission: RE | Admit: 2020-07-16 | Discharge: 2020-07-16 | Disposition: A | Discharge status: Home / Self Care | Payer: Medicare Other | Source: Ambulatory Visit | Attending: Oncology | Admitting: Oncology

## 2020-07-16 DIAGNOSIS — Z803 Family history of malignant neoplasm of breast: Secondary | ICD-10-CM | POA: Diagnosis not present

## 2020-07-16 DIAGNOSIS — C439 Malignant melanoma of skin, unspecified: Secondary | ICD-10-CM | POA: Insufficient documentation

## 2020-07-16 DIAGNOSIS — C7931 Secondary malignant neoplasm of brain: Secondary | ICD-10-CM | POA: Insufficient documentation

## 2020-07-16 DIAGNOSIS — D696 Thrombocytopenia, unspecified: Secondary | ICD-10-CM | POA: Insufficient documentation

## 2020-07-16 LAB — CMP (CANCER CENTER ONLY)
ALT: 9 U/L (ref 0–44)
AST: 21 U/L (ref 15–41)
Albumin: 4.2 g/dL (ref 3.5–5.0)
Alkaline Phosphatase: 94 U/L (ref 38–126)
Anion gap: 6 (ref 5–15)
BUN: 22 mg/dL — ABNORMAL HIGH (ref 6–20)
CO2: 27 mmol/L (ref 22–32)
Calcium: 9.4 mg/dL (ref 8.9–10.3)
Chloride: 108 mmol/L (ref 98–111)
Creatinine: 0.75 mg/dL (ref 0.44–1.00)
GFR, Estimated: >60 mL/min (ref >60)
Glucose, Bld: 93 mg/dL (ref 70–99)
Potassium: 3.8 mmol/L (ref 3.5–5.1)
Sodium: 141 mmol/L (ref 135–145)
Total Bilirubin: 0.4 mg/dL (ref 0.3–1.2)
Total Protein: 7.5 g/dL (ref 6.5–8.1)

## 2020-07-16 LAB — CBC WITH DIFFERENTIAL (CANCER CENTER ONLY)
Abs Immature Granulocytes: 0.01 10*3/uL (ref 0.00–0.07)
Basophils Absolute: 0 10*3/uL (ref 0.0–0.1)
Basophils Relative: 0 %
Eosinophils Absolute: 0 10*3/uL (ref 0.0–0.5)
Eosinophils Relative: 0 %
HCT: 41.8 % (ref 36.0–46.0)
Hemoglobin: 13.2 g/dL (ref 12.0–15.0)
Immature Granulocytes: 0 %
Lymphocytes Relative: 24 %
Lymphs Abs: 1.1 10*3/uL (ref 0.7–4.0)
MCH: 27.6 pg (ref 26.0–34.0)
MCHC: 31.6 g/dL (ref 30.0–36.0)
MCV: 87.3 fL (ref 80.0–100.0)
Monocytes Absolute: 0.5 10*3/uL (ref 0.1–1.0)
Monocytes Relative: 10 %
Neutro Abs: 3.1 10*3/uL (ref 1.7–7.7)
Neutrophils Relative %: 66 %
Platelet Count: 67 10*3/uL — ABNORMAL LOW (ref 150–400)
RBC: 4.79 MIL/uL (ref 3.87–5.11)
RDW: 12.3 % (ref 11.5–15.5)
WBC Count: 4.7 10*3/uL (ref 4.0–10.5)
nRBC: 0 % (ref 0.0–0.2)

## 2020-07-16 MED ORDER — IOHEXOL 300 MG/ML  SOLN
100.0000 mL | Freq: Once | INTRAMUSCULAR | Status: AC | PRN
  Administered 2020-07-16: 100 mL via INTRAVENOUS

## 2020-07-16 MED ORDER — IOHEXOL 9 MG/ML PO SOLN
ORAL | Status: AC
  Filled 2020-07-16: qty 1000

## 2020-07-16 MED ORDER — IOHEXOL 9 MG/ML PO SOLN
500.0000 mL | ORAL | Status: AC
  Administered 2020-07-16: 1000 mL via ORAL

## 2020-07-22 ENCOUNTER — Other Ambulatory Visit: Payer: Self-pay

## 2020-07-22 ENCOUNTER — Inpatient Hospital Stay (HOSPITAL_BASED_OUTPATIENT_CLINIC_OR_DEPARTMENT_OTHER): Payer: Medicare Other | Admitting: Oncology

## 2020-07-22 VITALS — BP 134/87 | HR 86 | Temp 98.1°F | Resp 18 | Ht 61.0 in | Wt 127.3 lb

## 2020-07-22 DIAGNOSIS — C439 Malignant melanoma of skin, unspecified: Secondary | ICD-10-CM | POA: Diagnosis not present

## 2020-07-22 DIAGNOSIS — C7931 Secondary malignant neoplasm of brain: Secondary | ICD-10-CM | POA: Diagnosis not present

## 2020-07-22 NOTE — Progress Notes (Signed)
Hematology and Oncology Follow Up Visit  Kaitlyn Duncan [id number] [birth year] 50 y.o. 07/22/2020 3:49 PM Kaitlyn Duncan, [name], NP   Principle Diagnosis: 50 year old woman with melanoma of unknown primary diagnosed in July 2019.  She was found to have stage IV disease including CNS, peritoneal and subcutaneous nodules.   Prior Therapy: He is status post subcutaneous nodule biopsy on 01/27/2018 which confirmed the presence of metastatic melanoma. She status post endoscopy on 01/28/2018 which showed subcutaneous involvement that consistent with metastatic melanoma.  Whole brain radiation for total of 30 Gy in 10 fractions started on 02/07/2018.   Mektovi 45 mg bid, [name] 450 mg daily started in August 2019.  Therapy discontinued in November 2019 after partial response.   Pembrolizumab 200 mg every 3 weeks started on 06/05/2018.  She is status post 12 cycles completed in July 2020.    Current therapy: Active surveillance.    Interim History: Kaitlyn Duncan is here for a follow-up visit.  Since the last visit, she reports no major changes in her health.  She denies any nausea, vomiting or abdominal pain.  She denies any recent hospitalization or illnesses.  She denies any neurological deficits or palpitation.  Her performance status and quality of life remain excellent she continues to work part-time.           .     Medications: Unchanged on review. Current Outpatient Medications  Medication Sig Dispense Refill  . Acetaminophen 500 MG capsule Take 1,000 mg by mouth every 6 (six) hours as needed.     . COLLAGEN PO Take by mouth.    . fluticasone (FLONASE) 50 MCG/ACT nasal spray Place 1 spray into both nostrils daily. 9.9 g 2  . Lactobacillus (PROBIOTIC ACIDOPHILUS PO) Take 2 Doses by mouth daily.     Marland Kitchen loratadine (CLARITIN) 10 MG tablet Take 10 mg by mouth daily.    . Milk Thistle 175 MG CAPS Take 175 mg by mouth daily.    . Multiple Vitamin (MULTIVITAMIN  WITH MINERALS) TABS tablet Take 1 tablet by mouth daily.    . mupirocin ointment (BACTROBAN) 2 % APPLY INTO THE NOSE AS DIRECTED TWICE DAILY 22 g 1  . omeprazole (PRILOSEC) 20 MG capsule Take 1 capsule (20 mg total) by mouth daily. 30 capsule 2  . solifenacin (VESICARE) 5 MG tablet Take 1 tablet (5 mg total) by mouth daily. 30 tablet 5  . TURMERIC PO Take by mouth.     No current facility-administered medications for this visit.     Allergies: No Known Allergies  .     Physical Exam:    Blood pressure 134/87, pulse 86, temperature 98.1 F (36.7 C), temperature source Tympanic, resp. rate 18, height 5\' 1"  (1.549 m), weight 127 lb 4.8 oz (57.7 kg), SpO2 100 %.      ECOG: 1    General appearance: Alert, awake without any distress. Head: Atraumatic without abnormalities Oropharynx: Without any thrush or ulcers. Eyes: No scleral icterus. Lymph nodes: No lymphadenopathy noted in the cervical, supraclavicular, or axillary nodes Heart:regular rate and rhythm, without any murmurs or gallops.   Lung: Clear to auscultation without any rhonchi, wheezes or dullness to percussion. Abdomin: Soft, nontender without any shifting dullness or ascites. Musculoskeletal: No clubbing or cyanosis. Neurological: No motor or sensory deficits. Skin: No rashes or lesions.                   Lab Results: Lab Results  Component Value Date  WBC 4.7 07/16/2020   HGB 13.2 07/16/2020   HCT 41.8 07/16/2020   MCV 87.3 07/16/2020   PLT 67 (L) 07/16/2020     Chemistry      Component Value Date/Time   NA 141 07/16/2020 1111   K 3.8 07/16/2020 1111   CL 108 07/16/2020 1111   CO2 27 07/16/2020 1111   BUN 22 (H) 07/16/2020 1111   CREATININE 0.75 07/16/2020 1111      Component Value Date/Time   CALCIUM 9.4 07/16/2020 1111   ALKPHOS 94 07/16/2020 1111   AST 21 07/16/2020 1111   ALT 9 07/16/2020 1111   BILITOT 0.4 07/16/2020 1111     1. Stable CT of the chest. No findings  of recurrent pulmonary metastasis. 2. Unchanged appearance of indeterminate hyperenhancing lesion within caudate lobe of liver. 3. Unchanged appearance of small, nonspecific enhancing lesions within the spleen. 4. Bilateral adrenal nodules are stable two decreased in size in the interval. 5. Previously characterized soft tissue lesion within the gallbladder lumen is less conspicuous on today's study. 6. Gallstones.  50 year old woman with:  1.  Stage IV melanoma of unknown primary diagnosed in July 2019.  He achieved a near complete response to the combination of oral targeted therapy and immunotherapy.   She is currently on active surveillance at this time without any evidence of relapsed disease.  CT scan done on July 16, 2020 was personally reviewed and discussed with the patient today.  She continues to have very little residual disease that is regressing despite being off treatment.  Risks and benefits of restarting salvage therapy were discussed.  Different salvage therapy options were reviewed including oral targeted therapy to target BRAF versus combination immunotherapy utilizing ipilimumab and nivolumab.  After discussion today, I recommended continued active surveillance currently and repeat imaging studies in 6 months.  She is agreeable with this plan.   2.  CNS metastasis: No evidence of recurrent disease based on her last MRI in September 2021.  3.  Prognosis: Her disease remains incurable although aggressive measures are warranted given her excellent pulm status.   4.  Thrombocytopenia: Remains overall mild but slightly declining over the last year.  Differential diagnosis including autoimmune etiology versus medications on others were discussed.  She is not at risk of bleeding currently and will continue to monitor.  5.  Follow-up: In 3 months for repeat evaluation.  30  minutes were dedicated to this encounter.  The time was spent on reviewing imaging studies,  laboratory data, discussing treatment options for the future and plan of care review.   Zola Button, MD 1/18/20223:49 PM

## 2020-07-23 ENCOUNTER — Ambulatory Visit: Payer: Medicaid Other | Admitting: Oncology

## 2020-07-24 ENCOUNTER — Telehealth: Payer: Self-pay | Admitting: Oncology

## 2020-07-24 NOTE — Telephone Encounter (Signed)
I lft the pt a vm to r/s her 4/19 appt w/Dr. Alen Blew per sch msg.

## 2020-07-25 ENCOUNTER — Telehealth: Payer: Self-pay | Admitting: Oncology

## 2020-07-25 NOTE — Telephone Encounter (Signed)
Called patient to r/s appointment per 1/20 sch msg. No answer. Left message with instructions for patient to call back to reschedule.

## 2020-09-04 ENCOUNTER — Other Ambulatory Visit: Payer: Self-pay

## 2020-09-04 ENCOUNTER — Ambulatory Visit (INDEPENDENT_AMBULATORY_CARE_PROVIDER_SITE_OTHER): Payer: Medicare Other | Admitting: Physician Assistant

## 2020-09-04 ENCOUNTER — Encounter: Payer: Self-pay | Admitting: Physician Assistant

## 2020-09-04 VITALS — BP 110/70 | HR 78 | Ht 61.0 in | Wt 123.8 lb

## 2020-09-04 DIAGNOSIS — R142 Eructation: Secondary | ICD-10-CM

## 2020-09-04 DIAGNOSIS — K625 Hemorrhage of anus and rectum: Secondary | ICD-10-CM

## 2020-09-04 DIAGNOSIS — K648 Other hemorrhoids: Secondary | ICD-10-CM

## 2020-09-04 DIAGNOSIS — K219 Gastro-esophageal reflux disease without esophagitis: Secondary | ICD-10-CM

## 2020-09-04 NOTE — Progress Notes (Signed)
Chief Complaint: Rectal bleeding and indigestion  HPI:    Kaitlyn Duncan is a 50 year old female with a past medical history as listed below including reflux, known to Dr. Tarri Glenn, who was referred to me by [name], NP for a complaint of rectal bleeding and indigestion.      04/22/2020 patient seen in clinic for excessive eructations.  At that time Dr. Tarri Glenn noted that she would have a low threshold for repeat EGD given gastric manifestations of her melanoma on EGD in 2019.  Was also recommended she avoid dairy, carbonated beverages and beer as well as sugar substitutes, eat and drink slowly and take regular walks after eating.  She is also started omeprazole 20 mg daily to see if this would help.    06/29/2020 patient reported excessive bowel movements and was recommended to use Imodium.    08/11/2020 patient contacted our clinic and described she recently had a CT scan which showed gallstones.  She was wondering if this is account for the burping and indigestion.  That time recommended she stay on a low-fat diet.  Was also discussed that we could trial Carafate to see if this helps.    08/31/2020 patient reported that she had a bowel movement and saw a little bit of blood in it.    Today, patient presents to clinic and tells me that the burping and indigestion have continued.  She really only use the Omeprazole a couple of times and not feel like it helped so she stopped it altogether.  She continues with daily eructations and indigestion, some better with Gas-X twice a day.  She asked me if this could be related to gallstones which were recently seen on CT.  Also discusses if there are holistic approaches to getting rid of gallstones.  She does not want to have surgery.  Denies abdominal pain.    Also tells me about episode of blood in her stool on 08/31/2020, she saw small amount of bright red blood in the stool and on the toilet paper, and this may have occurred the next day, has not seen since  then.  Does describe some recent constipation and straining.  This only occurs intermittently.    Denies fever, chills, weight loss or symptoms that awaken her from sleep.  Past Medical History:  Diagnosis Date  . Anemia   . Anemia   . Diabetes mellitus without complication (HCC)    prediabetic  . GERD (gastroesophageal reflux disease)   . melanoma with met dz dx'd 01/2018   brain and adrenal  . Seasonal allergies     Past Surgical History:  Procedure Laterality Date  . BIOPSY  01/28/2018   Procedure: BIOPSY;  Surgeon: Laurence Spates, MD;  Location: [facility];  Service: Endoscopy;;  . BREAST BIOPSY Left   . ESOPHAGOGASTRODUODENOSCOPY N/A 01/28/2018   Procedure: ESOPHAGOGASTRODUODENOSCOPY (EGD);  Surgeon: Laurence Spates, MD;  Location: [facility];  Service: Endoscopy;  Laterality: N/A;  . MOUTH SURGERY    . OVARY SURGERY     removed "something"    Current Outpatient Medications  Medication Sig Dispense Refill  . Acetaminophen 500 MG capsule Take 1,000 mg by mouth every 6 (six) hours as needed.     . COLLAGEN PO Take by mouth.    . fluticasone (FLONASE) 50 MCG/ACT nasal spray Place 1 spray into both nostrils daily. 9.9 g 2  . Lactobacillus (PROBIOTIC ACIDOPHILUS PO) Take 2 Doses by mouth daily.     Marland Kitchen loratadine (CLARITIN) 10 MG  tablet Take 10 mg by mouth daily.    . Milk Thistle 175 MG CAPS Take 175 mg by mouth daily.    . Multiple Vitamin (MULTIVITAMIN WITH MINERALS) TABS tablet Take 1 tablet by mouth daily.    . mupirocin ointment (BACTROBAN) 2 % APPLY INTO THE NOSE AS DIRECTED TWICE DAILY 22 g 1  . omeprazole (PRILOSEC) 20 MG capsule Take 1 capsule (20 mg total) by mouth daily. 30 capsule 2  . solifenacin (VESICARE) 5 MG tablet Take 1 tablet (5 mg total) by mouth daily. 30 tablet 5  . TURMERIC PO Take by mouth.     No current facility-administered medications for this visit.    Allergies as of 09/04/2020  . (No Known Allergies)    Family History  Problem Relation  Age of Onset  . Hypertension Father   . Breast cancer Maternal Aunt   . Breast cancer Paternal Aunt   . Diabetes Paternal Aunt   . Breast cancer Maternal Grandmother   . Breast cancer Paternal Grandmother   . Diabetes Paternal Grandmother   . Melanoma Mother   . Colon cancer Neg Hx   . Esophageal cancer Neg Hx   . Pancreatic cancer Neg Hx   . Stomach cancer Neg Hx   . Liver disease Neg Hx     Social History   Socioeconomic History  . Marital status: Single    Spouse name: Not on file  . Number of children: Not on file  . Years of education: Not on file  . Highest education level: Not on file  Occupational History  . Not on file  Tobacco Use  . Smoking status: Never Smoker  . Smokeless tobacco: Never Used  Vaping Use  . Vaping Use: Never used  Substance and Sexual Activity  . Alcohol use: No  . Drug use: No  . Sexual activity: Not Currently  Other Topics Concern  . Not on file  Social History Narrative  . Not on file   Social Determinants of Health   Financial Resource Strain: Not on file  Food Insecurity: Not on file  Transportation Needs: Not on file  Physical Activity: Not on file  Stress: Not on file  Social Connections: Not on file  Intimate Partner Violence: Not on file    Review of Systems:    Constitutional: No weight loss, fever or chills Cardiovascular: No chest pain Respiratory: No SOB  Gastrointestinal: See HPI and otherwise negative   Physical Exam:  Vital signs: BP 110/70 (BP Location: Left Arm, Patient Position: Sitting, Cuff Size: Normal)   Pulse 78   Ht $R'5\' 1"'GJ$  (1.549 m)   Wt 123 lb 12.8 oz (56.2 kg)   SpO2 98%   BMI 23.39 kg/m   Constitutional:   Pleasant Caucasian female appears to be in NAD, Well developed, Well nourished, alert and cooperative Respiratory: Respirations even and unlabored. Lungs clear to auscultation bilaterally.   No wheezes, crackles, or rhonchi.  Cardiovascular: Normal S1, S2. No MRG. Regular rate and rhythm. No  peripheral edema, cyanosis or pallor.  Gastrointestinal:  Soft, nondistended, nontender. No rebound or guarding. Normal bowel sounds. No appreciable masses or hepatomegaly. Rectal: External: Posterior hemorrhoid tag; internal: No mass, no residue; anoscopy: Grade 1 internal hemorrhoids Psychiatric: Oriented to person, place and time. Demonstrates good judgement and reason without abnormal affect or behaviors.  RELEVANT LABS AND IMAGING: CBC    Component Value Date/Time   WBC 4.7 07/16/2020 1111   WBC 6.6 12/23/2019 2155  RBC 4.79 07/16/2020 1111   HGB 13.2 07/16/2020 1111   HCT 41.8 07/16/2020 1111   PLT 67 (L) 07/16/2020 1111   MCV 87.3 07/16/2020 1111   MCH 27.6 07/16/2020 1111   MCHC 31.6 07/16/2020 1111   RDW 12.3 07/16/2020 1111   LYMPHSABS 1.1 07/16/2020 1111   MONOABS 0.5 07/16/2020 1111   EOSABS 0.0 07/16/2020 1111   BASOSABS 0.0 07/16/2020 1111    CMP     Component Value Date/Time   NA 141 07/16/2020 1111   K 3.8 07/16/2020 1111   CL 108 07/16/2020 1111   CO2 27 07/16/2020 1111   GLUCOSE 93 07/16/2020 1111   BUN 22 (H) 07/16/2020 1111   CREATININE 0.75 07/16/2020 1111   CALCIUM 9.4 07/16/2020 1111   PROT 7.5 07/16/2020 1111   ALBUMIN 4.2 07/16/2020 1111   AST 21 07/16/2020 1111   ALT 9 07/16/2020 1111   ALKPHOS 94 07/16/2020 1111   BILITOT 0.4 07/16/2020 1111   GFRNONAA >60 07/16/2020 1111   GFRAA >60 01/08/2020 1210    Assessment: 1.  Indigestion/gas: With below versus gastritis 2.  Gallstones: Seen at time of recent CT, incidental finding, possibly contributing to indigestion? 3.  Rectal bleeding: From internal hemorrhoid seen at time of exam today, recently normal colonoscopy  Plan: 1.  Scheduled patient for an EGD given ongoing gas and indigestion with history of melanoma with Dr. Tarri Glenn.  Patient is provided with a detailed list of risks for the procedure and she agrees to proceed. 2.  Briefly discussed Carafate and how this can be helpful for bile  reflux, which may be related to her gallstones, pending results of above could trial this to see if it helps. 3.  Patient tells me Omeprazole really did not help, though she was not taking it daily, this still may be beneficial in the future 4.  Discussed that rectal bleeding was from internal hemorrhoids.  Recent colonoscopy 03/19/2020.  Currently hemorrhoids do not appear to be bleeding.  Discussed that if she sees any further blood we can prescribe Hydrocortisone suppositories twice daily x7 days with one refill.  If this does not stop the bleeding then she should let us know.  Also discussed trying to avoid constipation.  She feels constipated she should start a stool softener or MiraLAX. 5.  Discussed homeopathic methods for eliminating gallstones, explained that she can try these and as long as they do not cause any harm to her then that is okay, but there is no research behind these methods and nothing to confirm that they will work. 6.  Patient to follow in clinic per recommendations of Dr. Tarri Glenn after time of procedure.  Ellouise Newer, PA-C [facility] 09/04/2020, 1:21 PM  Cc: [name], NP

## 2020-09-04 NOTE — Patient Instructions (Signed)
You have been scheduled for an endoscopy. Please follow written instructions given to you at your visit today. If you use inhalers (even only as needed), please bring them with you on the day of your procedure.  Due to recent changes in healthcare laws, you may see the results of your imaging and laboratory studies on MyChart before your provider has had a chance to review them.  We understand that in some cases there may be results that are confusing or concerning to you. Not all laboratory results come back in the same time frame and the provider may be waiting for multiple results in order to interpret others.  Please give [place] 48 hours in order for your provider to thoroughly review all the results before contacting the office for clarification of your results.   Continue your Gas-x twice daily.  I appreciate the opportunity to care for you. Ellouise Newer, P.A.-C

## 2020-09-05 NOTE — Progress Notes (Signed)
Reviewed and agree with management plans. Will offer earlier endoscopy if she does not want to wait until current EGD date of 10/22/20.  Aarini L. Tarri Glenn, MD, MPH

## 2020-09-09 NOTE — Progress Notes (Signed)
Called pt to offer a sooner appt date but pt declined at this time.

## 2020-09-26 ENCOUNTER — Other Ambulatory Visit (HOSPITAL_BASED_OUTPATIENT_CLINIC_OR_DEPARTMENT_OTHER): Payer: Self-pay

## 2020-10-01 ENCOUNTER — Other Ambulatory Visit: Payer: Self-pay | Admitting: Radiation Therapy

## 2020-10-04 ENCOUNTER — Other Ambulatory Visit: Payer: Self-pay

## 2020-10-04 ENCOUNTER — Ambulatory Visit (HOSPITAL_COMMUNITY)
Admission: RE | Admit: 2020-10-04 | Discharge: 2020-10-04 | Disposition: A | Discharge status: Home / Self Care | Payer: Medicare Other | Source: Ambulatory Visit | Attending: Internal Medicine | Admitting: Internal Medicine

## 2020-10-04 DIAGNOSIS — C7931 Secondary malignant neoplasm of brain: Secondary | ICD-10-CM | POA: Insufficient documentation

## 2020-10-04 MED ORDER — GADOBUTROL 1 MMOL/ML IV SOLN
5.0000 mL | Freq: Once | INTRAVENOUS | Status: AC | PRN
  Administered 2020-10-04: 5 mL via INTRAVENOUS

## 2020-10-05 ENCOUNTER — Emergency Department (HOSPITAL_COMMUNITY)
Admission: EM | Admit: 2020-10-05 | Discharge: 2020-10-08 | Disposition: A | Discharge status: Home / Self Care | Payer: Medicare Other | Attending: Emergency Medicine | Admitting: Emergency Medicine

## 2020-10-05 ENCOUNTER — Encounter (HOSPITAL_COMMUNITY): Payer: Self-pay | Admitting: Emergency Medicine

## 2020-10-05 ENCOUNTER — Emergency Department (HOSPITAL_COMMUNITY): Payer: Medicare Other

## 2020-10-05 DIAGNOSIS — Z046 Encounter for general psychiatric examination, requested by authority: Secondary | ICD-10-CM | POA: Diagnosis not present

## 2020-10-05 DIAGNOSIS — Z85828 Personal history of other malignant neoplasm of skin: Secondary | ICD-10-CM | POA: Diagnosis not present

## 2020-10-05 DIAGNOSIS — F319 Bipolar disorder, unspecified: Secondary | ICD-10-CM | POA: Diagnosis not present

## 2020-10-05 DIAGNOSIS — R451 Restlessness and agitation: Secondary | ICD-10-CM | POA: Diagnosis not present

## 2020-10-05 DIAGNOSIS — Z20822 Contact with and (suspected) exposure to covid-19: Secondary | ICD-10-CM | POA: Diagnosis not present

## 2020-10-05 DIAGNOSIS — C7931 Secondary malignant neoplasm of brain: Secondary | ICD-10-CM | POA: Insufficient documentation

## 2020-10-05 DIAGNOSIS — F4325 Adjustment disorder with mixed disturbance of emotions and conduct: Secondary | ICD-10-CM | POA: Insufficient documentation

## 2020-10-05 DIAGNOSIS — F29 Unspecified psychosis not due to a substance or known physiological condition: Secondary | ICD-10-CM | POA: Diagnosis not present

## 2020-10-05 DIAGNOSIS — Z923 Personal history of irradiation: Secondary | ICD-10-CM | POA: Insufficient documentation

## 2020-10-05 DIAGNOSIS — R4189 Other symptoms and signs involving cognitive functions and awareness: Secondary | ICD-10-CM | POA: Insufficient documentation

## 2020-10-05 DIAGNOSIS — R4182 Altered mental status, unspecified: Secondary | ICD-10-CM | POA: Diagnosis not present

## 2020-10-05 DIAGNOSIS — E119 Type 2 diabetes mellitus without complications: Secondary | ICD-10-CM | POA: Insufficient documentation

## 2020-10-05 LAB — AMMONIA: Ammonia: 23 umol/L (ref 9–35)

## 2020-10-05 LAB — URINALYSIS, COMPLETE (UACMP) WITH MICROSCOPIC
Bacteria, UA: NONE SEEN
Bilirubin Urine: NEGATIVE
Glucose, UA: NEGATIVE mg/dL
Hgb urine dipstick: NEGATIVE
Ketones, ur: NEGATIVE mg/dL
Leukocytes,Ua: NEGATIVE
Nitrite: NEGATIVE
Protein, ur: NEGATIVE mg/dL
Specific Gravity, Urine: 1.006 (ref 1.005–1.030)
pH: 8 (ref 5.0–8.0)

## 2020-10-05 LAB — CBC WITH DIFFERENTIAL/PLATELET
Abs Immature Granulocytes: 0.01 10*3/uL (ref 0.00–0.07)
Basophils Absolute: 0 10*3/uL (ref 0.0–0.1)
Basophils Relative: 0 %
Eosinophils Absolute: 0 10*3/uL (ref 0.0–0.5)
Eosinophils Relative: 0 %
HCT: 44.4 % (ref 36.0–46.0)
Hemoglobin: 14.2 g/dL (ref 12.0–15.0)
Immature Granulocytes: 0 %
Lymphocytes Relative: 19 %
Lymphs Abs: 0.8 10*3/uL (ref 0.7–4.0)
MCH: 27.7 pg (ref 26.0–34.0)
MCHC: 32 g/dL (ref 30.0–36.0)
MCV: 86.7 fL (ref 80.0–100.0)
Monocytes Absolute: 0.3 10*3/uL (ref 0.1–1.0)
Monocytes Relative: 7 %
Neutro Abs: 3.1 10*3/uL (ref 1.7–7.7)
Neutrophils Relative %: 74 %
Platelets: 61 10*3/uL — ABNORMAL LOW (ref 150–400)
RBC: 5.12 MIL/uL — ABNORMAL HIGH (ref 3.87–5.11)
RDW: 12.3 % (ref 11.5–15.5)
WBC: 4.2 10*3/uL (ref 4.0–10.5)
nRBC: 0 % (ref 0.0–0.2)

## 2020-10-05 LAB — RAPID URINE DRUG SCREEN, HOSP PERFORMED
Amphetamines: NOT DETECTED
Barbiturates: NOT DETECTED
Benzodiazepines: NOT DETECTED
Cocaine: NOT DETECTED
Opiates: NOT DETECTED
Tetrahydrocannabinol: NOT DETECTED

## 2020-10-05 LAB — COMPREHENSIVE METABOLIC PANEL
ALT: 17 U/L (ref 0–44)
AST: 27 U/L (ref 15–41)
Albumin: 5 g/dL (ref 3.5–5.0)
Alkaline Phosphatase: 74 U/L (ref 38–126)
Anion gap: 11 (ref 5–15)
BUN: 14 mg/dL (ref 6–20)
CO2: 22 mmol/L (ref 22–32)
Calcium: 9.6 mg/dL (ref 8.9–10.3)
Chloride: 106 mmol/L (ref 98–111)
Creatinine, Ser: 0.7 mg/dL (ref 0.44–1.00)
GFR, Estimated: >60 mL/min (ref >60)
Glucose, Bld: 128 mg/dL — ABNORMAL HIGH (ref 70–99)
Potassium: 3.9 mmol/L (ref 3.5–5.1)
Sodium: 139 mmol/L (ref 135–145)
Total Bilirubin: 0.5 mg/dL (ref 0.3–1.2)
Total Protein: 8.3 g/dL — ABNORMAL HIGH (ref 6.5–8.1)

## 2020-10-05 LAB — RESP PANEL BY RT-PCR (FLU A&B, COVID) ARPGX2
Influenza A by PCR: NEGATIVE
Influenza B by PCR: NEGATIVE
SARS Coronavirus 2 by RT PCR: NEGATIVE

## 2020-10-05 LAB — ETHANOL: Alcohol, Ethyl (B): <10 mg/dL (ref <10)

## 2020-10-05 LAB — CBG MONITORING, ED: Glucose-Capillary: 117 mg/dL — ABNORMAL HIGH (ref 70–99)

## 2020-10-05 LAB — I-STAT BETA HCG BLOOD, ED (MC, WL, AP ONLY): I-stat hCG, quantitative: <5 m[IU]/mL (ref <5)

## 2020-10-05 MED ORDER — LORAZEPAM 2 MG/ML IJ SOLN
1.0000 mg | Freq: Once | INTRAMUSCULAR | Status: AC
  Administered 2020-10-05: 1 mg via INTRAMUSCULAR
  Filled 2020-10-05: qty 1

## 2020-10-05 MED ORDER — ZOLPIDEM TARTRATE 5 MG PO TABS
5.0000 mg | ORAL_TABLET | Freq: Every evening | ORAL | Status: DC | PRN
  Administered 2020-10-07: 5 mg via ORAL
  Filled 2020-10-05 (×2): qty 1

## 2020-10-05 MED ORDER — ONDANSETRON HCL 4 MG PO TABS: 4.0000 mg | ORAL_TABLET | Freq: Three times a day (TID) | ORAL | Status: DC | PRN

## 2020-10-05 MED ORDER — ZIPRASIDONE MESYLATE 20 MG IM SOLR
20.0000 mg | INTRAMUSCULAR | Status: AC | PRN
  Administered 2020-10-05: 20 mg via INTRAMUSCULAR
  Filled 2020-10-05: qty 20

## 2020-10-05 MED ORDER — ACETAMINOPHEN 325 MG PO TABS
650.0000 mg | ORAL_TABLET | ORAL | Status: DC | PRN
  Administered 2020-10-07: 650 mg via ORAL
  Filled 2020-10-05: qty 2

## 2020-10-05 MED ORDER — STERILE WATER FOR INJECTION IJ SOLN
INTRAMUSCULAR | Status: AC
  Filled 2020-10-05: qty 10

## 2020-10-05 MED ORDER — LORAZEPAM 1 MG PO TABS
1.0000 mg | ORAL_TABLET | ORAL | Status: AC | PRN
  Administered 2020-10-07: 1 mg via ORAL
  Filled 2020-10-05: qty 1

## 2020-10-05 MED ORDER — OLANZAPINE 5 MG PO TBDP
5.0000 mg | ORAL_TABLET | Freq: Three times a day (TID) | ORAL | Status: DC | PRN
  Administered 2020-10-07 – 2020-10-08 (×2): 5 mg via ORAL
  Filled 2020-10-05 (×3): qty 1

## 2020-10-05 NOTE — ED Provider Notes (Signed)
[facility] DEPT Provider Note   CSN: [id number] Arrival date & time: 10/05/20  1319     History Chief Complaint  Patient presents with  . Altered Mental Status    Kaitlyn Duncan is a 50 y.o. female.  Presents to ER with concern for altered mental status.  Brought in by police.  Bystander called police after she was found wandering alongside the road with a robe on.  Patient does not provide any meaningful historical detail.  History limited.  Additional history obtained from chart review, discussion with family.  Patient's father reports that she has a prior history of bipolar disorder, psychosis.  10 years ago had a very bad episode.  More recently, states patient was having some significant financial difficulties.  Had been requesting money.  Had been living at extended stay and they recently increased the rent.  Has been acting more religious lately.  Has a history of cyber stalking and has been imprisoned previously because of her activity.  Patient has a history of metastatic melanoma.  MRI performed yesterday is reassuring.  No new areas to suggest active disease or disease progression.  HPI     Past Medical History:  Diagnosis Date  . Anemia   . Anemia   . Diabetes mellitus without complication (HCC)    prediabetic  . GERD (gastroesophageal reflux disease)   . melanoma with met dz dx'd 01/2018   brain and adrenal  . Seasonal allergies     Patient Active Problem List   Diagnosis Date Noted  . Melanoma of skin ([place]) 05/29/2018  . Goals of care, counseling/discussion 05/29/2018  . Brain metastasis ([place]) 01/26/2018    Past Surgical History:  Procedure Laterality Date  . BIOPSY  01/28/2018   Procedure: BIOPSY;  Surgeon: Laurence Spates, MD;  Location: [facility] END[facility]COPY;  Service: Endoscopy;;  . BREAST BIOPSY Left   . ESOPHAGOGASTRODUODENOSCOPY N/A 01/28/2018   Procedure: ESOPHAGOGASTRODUODENOSCOPY (EGD);  Surgeon: Laurence Spates, MD;   Location: [facility];  Service: Endoscopy;  Laterality: N/A;  . MOUTH SURGERY    . OVARY SURGERY     removed "something"     OB History    Gravida  0   Para  0   Term  0   Preterm  0   AB  0   Living  0     SAB  0   IAB  0   Ectopic  0   Multiple  0   Live Births  0           Family History  Problem Relation Age of Onset  . Hypertension Father   . Breast cancer Maternal Aunt   . Breast cancer Paternal Aunt   . Diabetes Paternal Aunt   . Breast cancer Maternal Grandmother   . Breast cancer Paternal Grandmother   . Diabetes Paternal Grandmother   . Melanoma Mother   . Colon cancer Neg Hx   . Esophageal cancer Neg Hx   . Pancreatic cancer Neg Hx   . Stomach cancer Neg Hx   . Liver disease Neg Hx     Social History   Tobacco Use  . Smoking status: Never Smoker  . Smokeless tobacco: Never Used  Vaping Use  . Vaping Use: Never used  Substance Use Topics  . Alcohol use: No  . Drug use: No    Home Medications Prior to Admission medications   Medication Sig Start Date End Date Taking? Authorizing Provider  Acetaminophen 500  MG capsule Take 1,000 mg by mouth every 6 (six) hours as needed.     [provider]  COLLAGEN PO Take by mouth.    [provider]  fluticasone (FLONASE) 50 MCG/ACT nasal spray Place 1 spray into both nostrils daily. 09/06/18   Ventura Sellers, MD  Lactobacillus (PROBIOTIC ACIDOPHILUS PO) Take 2 Doses by mouth daily.  10/04/19   [provider]  loratadine (CLARITIN) 10 MG tablet Take 10 mg by mouth daily.    [provider]  Milk Thistle 175 MG CAPS Take 175 mg by mouth daily. 10/04/19   [provider]  Multiple Vitamin (MULTIVITAMIN WITH MINERALS) TABS tablet Take 1 tablet by mouth daily.    [provider]  mupirocin ointment (BACTROBAN) 2 % APPLY INTO THE NOSE AS DIRECTED TWICE DAILY 06/11/20 06/11/21  Wyatt Portela, MD  solifenacin (VESICARE) 5 MG tablet TAKE 1 TABLET BY  MOUTH DAILY. 01/15/20 01/14/21  Bruning, Ashlyn, PA-C  TURMERIC PO Take by mouth.    [provider]    Allergies    Patient has no known allergies.  Review of Systems   Review of Systems  Unable to perform ROS: Psychiatric disorder    Physical Exam Updated Vital Signs BP (!) 130/93 (BP Location: Left Arm)   Pulse 83   Temp 98.1 F (36.7 C) (Oral)   Resp 18   SpO2 98%   Physical Exam Vitals and nursing note reviewed.  Constitutional:      General: She is not in acute distress.    Appearance: She is well-developed.  HENT:     Head: Normocephalic and atraumatic.  Eyes:     Conjunctiva/sclera: Conjunctivae normal.  Cardiovascular:     Rate and Rhythm: Normal rate and regular rhythm.     Heart sounds: No murmur heard.   Pulmonary:     Effort: Pulmonary effort is normal. No respiratory distress.     Breath sounds: Normal breath sounds.  Abdominal:     Palpations: Abdomen is soft.     Tenderness: There is no abdominal tenderness.  Musculoskeletal:        General: No deformity or signs of injury.     Cervical back: Neck supple.  Skin:    General: Skin is warm and dry.  Neurological:     General: No focal deficit present.     Mental Status: She is alert.  Psychiatric:     Comments: Agitated, responding to internal stimuli     ED Results / Procedures / Treatments   Labs (all labs ordered are listed, but only abnormal results are displayed) Labs Reviewed  COMPREHENSIVE METABOLIC PANEL - Abnormal; Notable for the following components:      Result Value   Glucose, Bld 128 (*)    Total Protein 8.3 (*)    All other components within normal limits  CBC WITH DIFFERENTIAL/PLATELET - Abnormal; Notable for the following components:   RBC 5.12 (*)    Platelets 61 (*)    All other components within normal limits  URINALYSIS, COMPLETE (UACMP) WITH MICROSCOPIC - Abnormal; Notable for the following components:   Color, Urine STRAW (*)    All other components within  normal limits  CBG MONITORING, ED - Abnormal; Notable for the following components:   Glucose-Capillary 117 (*)    All other components within normal limits  RESP PANEL BY RT-PCR (FLU A&B, COVID) ARPGX2  AMMONIA  ETHANOL  RAPID URINE DRUG SCREEN, HOSP PERFORMED  I-STAT BETA HCG  BLOOD, ED [facility], [facility], [facility] ONLY)    EKG EKG Interpretation  Date/Time:  Sunday October 05 2020 15:26:54 EDT Ventricular Rate:  92 PR Interval:  140 QRS Duration: 96 QT Interval:  358 QTC Calculation: 443 R Axis:   95 Text Interpretation: Sinus rhythm Borderline right axis deviation Confirmed by Madalyn Rob [phone]) on 10/05/2020 3:33:13 PM   Radiology CT HEAD WO CONTRAST  Result Date: 10/05/2020 CLINICAL DATA:  Mental status change. EXAM: CT HEAD WITHOUT CONTRAST TECHNIQUE: Contiguous axial images were obtained from the base of the skull through the vertex without intravenous contrast. COMPARISON:  MRI October 04, 2020.  CT head January 26, 2018 FINDINGS: Brain: When comparing to recent MRI, similar post treatment change in the white matter bilaterally. No evidence of acute large vascular territory infarct. No acute hemorrhage. No hydrocephalus. No midline shift. Basal cisterns are patent. Areas of treated metastatic melanoma better characterized on recent MRI. Vascular: No hyperdense vessel identified. Skull: No acute fracture. Sinuses/Orbits: Partially imaged small left maxillary sinus with bony wall thickening and mucosal thickening, suggestive of chronic sinusitis. Remaining visualized sinuses are clear. Unremarkable orbits. Other: No mastoid effusions. IMPRESSION: 1. No evidence of acute intracranial abnormality. 2. Areas of treated metastatic melanoma better characterized on recent MRI. 3. Treatment related white matter change. 4. Findings suggestive of chronic left maxillary sinusitis. Electronically Signed   By: Margaretha Sheffield MD   On: 10/05/2020 16:42   MR BRAIN W WO CONTRAST  Result Date: 10/05/2020 CLINICAL  DATA:  CNS neoplasm. Assess treatment response. Metastatic melanoma. EXAM: MRI HEAD WITHOUT AND WITH CONTRAST TECHNIQUE: Multiplanar, multiecho pulse sequences of the brain and surrounding structures were obtained without and with intravenous contrast. CONTRAST:  9mL GADAVIST GADOBUTROL 1 MMOL/ML IV SOLN COMPARISON:  MR head without and with contrast 04/02/2020 11/15/2019 FINDINGS: Brain: Focal T1 shortening in the medial right thalamus on image 69 of series 12 in 16 is stable. Focus of subtle enhancement in the medial right parietal lobe on image 62 of series 16 is stable to slightly decreased from prior exam. Susceptibility is present both these lesions compatible with remote blood products. Multiple other bilateral punctate areas of susceptibility without enhancement are stable. No new hemorrhage is present. Confluent periventricular and deep white matter T2 hyperintensities present bilaterally. Ventricles are stable. White matter changes extend into the brainstem. No significant extraaxial fluid collection is present. Vascular: Flow is present in the major intracranial arteries. Skull and upper cervical spine: The craniocervical junction is normal. Upper cervical spine is within normal limits. Marrow signal is unremarkable. Sinuses/Orbits: Chronic opacification of the left maxillary sinus is stable. Fluid level is present in the left mastoid air cell. Remaining paranasal sinuses in the right mastoid air cells are clear. Globes and orbits are within normal limits. IMPRESSION: 1. Stable to slight decrease in size of focal enhancement in the medial right parietal lobe. This is compatible with treated disease. 2. Stable T1 shortening in the medial right thalamus. This is compatible with treated disease. 3. Multiple other punctate areas of susceptibility are stable. Findings compatible with treated lesions. 4. No new areas of enhancement or hemorrhage to suggest active disease or disease progression. 5. Stable  atrophy and diffuse white matter disease. This likely reflects the sequela of chronic microvascular ischemia. This may be exaggerated by whole brain radiation. 6. Chronic left maxillary sinus disease. 7. Left mastoid effusion. No obstructing nasopharyngeal lesion present. Electronically Signed   By: San Morelle M.D.   On: 10/05/2020 15:54  Procedures Procedures   Medications Ordered in ED Medications  OLANZapine zydis (ZYPREXA) disintegrating tablet 5 mg (has no administration in time range)    And  LORazepam (ATIVAN) tablet 1 mg (has no administration in time range)    And  ziprasidone (GEODON) injection 20 mg (has no administration in time range)  acetaminophen (TYLENOL) tablet 650 mg (has no administration in time range)  ondansetron (ZOFRAN) tablet 4 mg (has no administration in time range)  zolpidem (AMBIEN) tablet 5 mg (has no administration in time range)  LORazepam (ATIVAN) injection 1 mg (1 mg Intramuscular Given 10/05/20 1421)  LORazepam (ATIVAN) injection 1 mg (1 mg Intramuscular Given 10/05/20 1939)    ED Course  I have reviewed the triage vital signs and the nursing notes.  Pertinent labs & imaging results that were available during my care of the patient were reviewed by me and considered in my medical decision making (see chart for details).  Clinical Course as of 10/05/20 2032  Nancy Fetter Oct 05, 2020  1523 Discussed case with sister, recommends [phone] - Father [RD]    Clinical Course User Index [RD] Lucrezia Starch, MD   MDM Rules/Calculators/A&P                         50 year old lady notable medical history for metastatic melanoma but stable presents to ER with concern for altered mental status.  Police completed IVC paperwork.  I completed first exam.  Patient appears acutely psychotic.  Basic labs stable.  CT head negative for acute process today.  She is medically cleared for further psych eval. Will place orders to hold in ER while awaiting dispo  from Psych.  Final Clinical Impression(s) / ED Diagnoses Final diagnoses:  Altered mental status, unspecified altered mental status type  Psychosis, unspecified psychosis type [facility])    Rx / DC Orders ED Discharge Orders    None       Lucrezia Starch, MD 10/05/20 2032

## 2020-10-05 NOTE — BH Assessment (Addendum)
Per Shana Chute, RN no appropriate beds at [facility]. Disposition CSW to seek placement. Updated disposition discussed with Larene Beach, RN.   Vertell Novak, [place], [facility], [facility] Triage Specialist [phone]

## 2020-10-05 NOTE — BH Assessment (Signed)
Clinician sent a message via secure chat in [place] to [facility], RN to see if the pt is able to be seen if so can she be placed in a private room (pt is in a hallway bed).   Clinician awaiting response.    Vertell Novak, [place], [facility], [facility] Triage Specialist [phone]

## 2020-10-05 NOTE — ED Triage Notes (Signed)
Patient BIB [employer], found by bystander walking in the street in robe. Altered mental status. Patient ran from [facility] reporting demons are trying to get her.

## 2020-10-05 NOTE — ED Provider Notes (Signed)
Patient presents with c/o altered mental status.  Patient found in public wearing only a gown.  She was saying something about demons chasing her.  Will not answer questions.  Ran from police.  Disoriented for EMS.  Exam:  Gen NAD; Heart tachycardia, nml S1,S2, no m/r/g; Lungs CTAB; Abd soft, NT, no rebound or guarding; Ext 2+ pedal pulses bilaterally, no edema; Neuro patient disoriented, agitated, trying to leave and yelling in the hallway.  MSE was initiated and I personally evaluated the patient and placed orders (if any) at  1:48 PM on October 05, 2020.  The patient appears stable so that the remainder of the MSE may be completed by another provider.   Carlisle Cater, PA-C 10/05/20 1352    Lucrezia Starch, MD 10/06/20 [phone]

## 2020-10-05 NOTE — BH Assessment (Signed)
Pt asked clinician to document her upcoming doctor appointments:    Dr. Mickeal Skinner on Wednesday at 12PM, for a follow up appointment.  Dr. [name] at the [facility] at [place], [place], [facility], [facility] Triage Specialist [phone]

## 2020-10-05 NOTE — ED Notes (Signed)
MSE waiver not signed as patient is altered.

## 2020-10-05 NOTE — ED Notes (Signed)
Soft restraints placed on pt.

## 2020-10-05 NOTE — ED Notes (Signed)
Soft restraints placed on pt, no signs of distress.
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
## 2020-10-05 NOTE — ED Notes (Signed)
Pt removed soft restraints, soft restraints back in place.

## 2020-10-05 NOTE — ED Notes (Signed)
Pt escorted to ED with [employer] screaming and in handcuff.  Attempted to re-orient pt without much success.

## 2020-10-05 NOTE — BH Assessment (Signed)
Asencion Partridge, NT/NS to fax pt's IVC paperwork.    Vertell Novak, [place], [facility], [facility] Triage Specialist [phone]

## 2020-10-06 DIAGNOSIS — F29 Unspecified psychosis not due to a substance or known physiological condition: Secondary | ICD-10-CM | POA: Diagnosis not present

## 2020-10-06 MED ORDER — LIP MEDEX EX OINT
TOPICAL_OINTMENT | Freq: Once | CUTANEOUS | Status: AC
  Administered 2020-10-06: 1 via TOPICAL
  Filled 2020-10-06: qty 7

## 2020-10-06 NOTE — ED Notes (Signed)
[facility] 10/07/2020 at 12pm.  Charge nurse [place] aware, will also pass along to day shift oncoming RN in the morning.

## 2020-10-06 NOTE — ED Notes (Signed)
0938 restraint documentation entry in error

## 2020-10-06 NOTE — ED Notes (Signed)
Patient's belonging bag placed in locker. Inside large blue purse

## 2020-10-07 ENCOUNTER — Inpatient Hospital Stay: Payer: Medicare Other | Admitting: Internal Medicine

## 2020-10-07 ENCOUNTER — Telehealth: Payer: Self-pay

## 2020-10-07 DIAGNOSIS — F4325 Adjustment disorder with mixed disturbance of emotions and conduct: Secondary | ICD-10-CM | POA: Diagnosis not present

## 2020-10-07 DIAGNOSIS — F29 Unspecified psychosis not due to a substance or known physiological condition: Secondary | ICD-10-CM | POA: Diagnosis not present

## 2020-10-07 NOTE — Progress Notes (Signed)
 [facility] [address]  [place], [place] [address] [phone]   Interval Evaluation  Date of Service: 04/08/20 Patient Name: Kaitlyn Duncan Patient MRN: [id number] Patient DOB: 03/05/1971 Provider:  K , MD  Identifying Statement:  Kaitlyn Duncan is a 49 y.o. female with Brain metastasis (HCC) [C79.31]     Primary Cancer:  Oncologic History: Oncology History  Melanoma of skin (HCC)  05/29/2018 Initial Diagnosis   Melanoma of skin (HCC)   06/05/2018 -  Chemotherapy   The patient had pembrolizumab (KEYTRUDA) 200 mg in sodium chloride 0.9 % 50 mL chemo infusion, 200 mg, Intravenous, Once, 12 of 12 cycles Administration: 200 mg (06/05/2018), 200 mg (06/26/2018), 200 mg (07/17/2018), 200 mg (08/07/2018), 200 mg (08/28/2018), 200 mg (09/18/2018), 200 mg (10/09/2018), 200 mg (10/30/2018), 200 mg (11/20/2018), 200 mg (12/11/2018), 200 mg (01/01/2019), 200 mg (01/22/2019)  for chemotherapy treatment.     CNS Oncologic History 02/17/18: Completes WBRT for innumerable metastases  Interval History:  Kaitlyn Duncan is visited today in the ED instead of brain tumor clinic. She was scheduled today for follow up after recent MRI brain.  It appears she was brought by police after some abberrant behavior reported, she has seen psychiatry here.  She does acknowledge stressors with her living situation, but denies any specific issue.  She still describes impaired short term memory, "slow thinking".  No longer working at [employer] due to transportation issues.  H+P (09/06/18) Patient presents today due to her concern over her anti-epileptic drug regimen.  She has been taking Keppra 1000mg twice per day since July 2019, when she was initially diagnosed with diffuse brain metastases secondary to melanoma.  She denies ever experiencing a seizure.  Her gait has been normal/steady, she has mild tension headaches on occasion only.  There is no  history of epilepsy in the family, no prior neurotrauma.  Melanoma has been managed for ~1 year with keytruda monotherapy.  Medications: No current facility-administered medications on file prior to encounter.   Current Outpatient Medications on File Prior to Encounter  Medication Sig Dispense Refill  . Acetaminophen 500 MG capsule Take 1,000 mg by mouth every 6 (six) hours as needed for fever or pain.    . COLLAGEN PO Take 1 tablet by mouth daily.    . Lactobacillus (PROBIOTIC ACIDOPHILUS PO) Take 2 Doses by mouth daily.     . loratadine (CLARITIN) 10 MG tablet Take 10 mg by mouth daily.    . Milk Thistle 175 MG CAPS Take 175 mg by mouth daily.    . Multiple Vitamin (MULTIVITAMIN WITH MINERALS) TABS tablet Take 1 tablet by mouth daily.    . solifenacin (VESICARE) 5 MG tablet TAKE 1 TABLET BY MOUTH DAILY. (Patient taking differently: Take 5 mg by mouth daily.) 30 tablet 5  . TURMERIC PO Take 1 tablet by mouth daily.    . mupirocin ointment (BACTROBAN) 2 % APPLY INTO THE NOSE AS DIRECTED TWICE DAILY (Patient not taking: Reported on 10/06/2020) 22 g 1    Allergies: No Known Allergies Past Medical History:  Past Medical History:  Diagnosis Date  . Anemia   . Anemia   . Diabetes mellitus without complication (HCC)    prediabetic  . GERD (gastroesophageal reflux disease)   . melanoma with met dz dx'd 01/2018   brain and adrenal  . Seasonal allergies    Past Surgical History:  Past Surgical History:  Procedure Laterality Date  .   BIOPSY  01/28/2018   Procedure: BIOPSY;  Surgeon: Laurence Spates, MD;  Location: [facility];  Service: Endoscopy;;  . BREAST BIOPSY Left   . ESOPHAGOGASTRODUODENOSCOPY N/A 01/28/2018   Procedure: ESOPHAGOGASTRODUODENOSCOPY (EGD);  Surgeon: Laurence Spates, MD;  Location: [facility];  Service: Endoscopy;  Laterality: N/A;  . MOUTH SURGERY    . OVARY SURGERY     removed "something"   Social History:  Social History   Socioeconomic History  . Marital  status: Single    Spouse name: Not on file  . Number of children: Not on file  . Years of education: Not on file  . Highest education level: Not on file  Occupational History  . Not on file  Tobacco Use  . Smoking status: Never Smoker  . Smokeless tobacco: Never Used  Vaping Use  . Vaping Use: Never used  Substance and Sexual Activity  . Alcohol use: No  . Drug use: No  . Sexual activity: Not Currently  Other Topics Concern  . Not on file  Social History Narrative  . Not on file   Social Determinants of Health   Financial Resource Strain: Not on file  Food Insecurity: Not on file  Transportation Needs: Not on file  Physical Activity: Not on file  Stress: Not on file  Social Connections: Not on file  Intimate Partner Violence: Not on file   Family History:  Family History  Problem Relation Age of Onset  . Hypertension Father   . Breast cancer Maternal Aunt   . Breast cancer Paternal Aunt   . Diabetes Paternal Aunt   . Breast cancer Maternal Grandmother   . Breast cancer Paternal Grandmother   . Diabetes Paternal Grandmother   . Melanoma Mother   . Colon cancer Neg Hx   . Esophageal cancer Neg Hx   . Pancreatic cancer Neg Hx   . Stomach cancer Neg Hx   . Liver disease Neg Hx     Review of Systems: Constitutional: Denies fevers, chills or abnormal weight loss Eyes: Denies blurriness of vision Ears, nose, mouth, throat, and face: Denies mucositis or sore throat Respiratory: Denies cough, dyspnea or wheezes Cardiovascular: Denies palpitation, chest discomfort or lower extremity swelling Gastrointestinal:  Denies nausea, constipation, diarrhea GU: Denies dysuria or incontinence Skin: Denies abnormal skin rashes Neurological: Per HPI Musculoskeletal: Denies joint pain, back or neck discomfort. No decrease in ROM Behavioral/Psych: Denies anxiety, disturbance in thought content, and mood instability   Physical Exam: Vitals:   10/07/20 0613 10/07/20 1246  BP:  (!) 133/99 (!) 121/96  Pulse: 87 (!) 102  Resp: 16 18  Temp: 97.8 F (36.6 C) 98.2 F (36.8 C)  SpO2: 97% 98%   KPS: 90. General: Alert, cooperative, pleasant, in no acute distress Head: Craniotomy scar noted, dry and intact. EENT: No conjunctival injection or scleral icterus. Oral mucosa moist Lungs: Resp effort normal Cardiac: Regular rate and rhythm Abdomen: Soft, non-distended abdomen Skin: No rashes cyanosis or petechiae. Extremities: No clubbing or edema  Neurologic Exam: Mental Status: Awake, alert, attentive to examiner. Oriented to self and environment. Language is fluent with intact comprehension. Age advanced psychomotor slowing, delayed object recall Cranial Nerves: Visual acuity is grossly normal. Visual fields are full. Extra-ocular movements intact. No ptosis. Face is symmetric, tongue midline. Motor: Tone and bulk are normal. Power is full in both arms and legs. Reflexes are symmetric, no pathologic reflexes present. Intact finger to nose bilaterally Sensory: Intact to light touch and temperature Gait: Deferred  Labs:  I have reviewed the data as listed    Component Value Date/Time   NA 139 10/05/2020 1422   K 3.9 10/05/2020 1422   CL 106 10/05/2020 1422   CO2 22 10/05/2020 1422   GLUCOSE 128 (H) 10/05/2020 1422   BUN 14 10/05/2020 1422   CREATININE 0.70 10/05/2020 1422   CREATININE 0.75 07/16/2020 1111   CALCIUM 9.6 10/05/2020 1422   PROT 8.3 (H) 10/05/2020 1422   ALBUMIN 5.0 10/05/2020 1422   AST 27 10/05/2020 1422   AST 21 07/16/2020 1111   ALT 17 10/05/2020 1422   ALT 9 07/16/2020 1111   ALKPHOS 74 10/05/2020 1422   BILITOT 0.5 10/05/2020 1422   BILITOT 0.4 07/16/2020 1111   GFRNONAA >60 10/05/2020 1422   GFRNONAA >60 07/16/2020 1111   GFRAA >60 01/08/2020 1210   Lab Results  Component Value Date   WBC 4.2 10/05/2020   NEUTROABS 3.1 10/05/2020   HGB 14.2 10/05/2020   HCT 44.4 10/05/2020   MCV 86.7 10/05/2020   PLT 61 (L) 10/05/2020     Imaging:  CT HEAD WO CONTRAST  Result Date: 10/05/2020 CLINICAL DATA:  Mental status change. EXAM: CT HEAD WITHOUT CONTRAST TECHNIQUE: Contiguous axial images were obtained from the base of the skull through the vertex without intravenous contrast. COMPARISON:  MRI October 04, 2020.  CT head January 26, 2018 FINDINGS: Brain: When comparing to recent MRI, similar post treatment change in the white matter bilaterally. No evidence of acute large vascular territory infarct. No acute hemorrhage. No hydrocephalus. No midline shift. Basal cisterns are patent. Areas of treated metastatic melanoma better characterized on recent MRI. Vascular: No hyperdense vessel identified. Skull: No acute fracture. Sinuses/Orbits: Partially imaged small left maxillary sinus with bony wall thickening and mucosal thickening, suggestive of chronic sinusitis. Remaining visualized sinuses are clear. Unremarkable orbits. Other: No mastoid effusions. IMPRESSION: 1. No evidence of acute intracranial abnormality. 2. Areas of treated metastatic melanoma better characterized on recent MRI. 3. Treatment related white matter change. 4. Findings suggestive of chronic left maxillary sinusitis. Electronically Signed   By: Frederick S Jones MD   On: 10/05/2020 16:42   MR BRAIN W WO CONTRAST  Result Date: 10/05/2020 CLINICAL DATA:  CNS neoplasm. Assess treatment response. Metastatic melanoma. EXAM: MRI HEAD WITHOUT AND WITH CONTRAST TECHNIQUE: Multiplanar, multiecho pulse sequences of the brain and surrounding structures were obtained without and with intravenous contrast. CONTRAST:  5mL GADAVIST GADOBUTROL 1 MMOL/ML IV SOLN COMPARISON:  MR head without and with contrast 04/02/2020 11/15/2019 FINDINGS: Brain: Focal T1 shortening in the medial right thalamus on image 69 of series 12 in 16 is stable. Focus of subtle enhancement in the medial right parietal lobe on image 62 of series 16 is stable to slightly decreased from prior exam. Susceptibility is  present both these lesions compatible with remote blood products. Multiple other bilateral punctate areas of susceptibility without enhancement are stable. No new hemorrhage is present. Confluent periventricular and deep white matter T2 hyperintensities present bilaterally. Ventricles are stable. White matter changes extend into the brainstem. No significant extraaxial fluid collection is present. Vascular: Flow is present in the major intracranial arteries. Skull and upper cervical spine: The craniocervical junction is normal. Upper cervical spine is within normal limits. Marrow signal is unremarkable. Sinuses/Orbits: Chronic opacification of the left maxillary sinus is stable. Fluid level is present in the left mastoid air cell. Remaining paranasal sinuses in the right mastoid air cells are clear. Globes and orbits are within normal limits. IMPRESSION: 1.   Stable to slight decrease in size of focal enhancement in the medial right parietal lobe. This is compatible with treated disease. 2. Stable T1 shortening in the medial right thalamus. This is compatible with treated disease. 3. Multiple other punctate areas of susceptibility are stable. Findings compatible with treated lesions. 4. No new areas of enhancement or hemorrhage to suggest active disease or disease progression. 5. Stable atrophy and diffuse white matter disease. This likely reflects the sequela of chronic microvascular ischemia. This may be exaggerated by whole brain radiation. 6. Chronic left maxillary sinus disease. 7. Left mastoid effusion. No obstructing nasopharyngeal lesion present. Electronically Signed   By: San Morelle M.D.   On: 10/05/2020 15:54    [place] Clinician Interpretation: I have personally reviewed the radiological images as listed.  My interpretation, in the context of the patient's clinical presentation, is stable disease   Assessment/Plan 1. Brain metastasis ([place])  Kaitlyn Duncan is radiographically stable today.  We  reviewed MRI results with her at bedside.  Cognitive impairment is chronic, partly due to whole brain radiation exposure, subsequent leukoencephalopathy and cortical atrophy.   Presenting issues do not appear focal in nature; rather behavioral or social.  There may be decompensation of some underlying psychiatric pathology, but not meeting criteria for admission per psych team.  She does not appear to be harm to herself or others at this time.  We recommend evaluation by social work and/or case management to ensure safe disposition.    We ask that [name] return to clinic in 6 months following next brain MRI, or sooner as needed.  We appreciate the opportunity to participate in the care of [facility].   All questions were answered. The patient knows to call the clinic with any problems, questions or concerns. No barriers to learning were detected.  I have spent a total of 55 minutes of face-to-face and non-face-to-face time, excluding clinical staff time, preparing to see patient, ordering tests and/or medications, counseling the patient, and independently interpreting results and communicating results to the patient/family/caregiver    Ventura Sellers, MD Medical Director of Neuro-Oncology [facility] [place] 04/08/20 2:16 PM

## 2020-10-07 NOTE — Progress Notes (Signed)
TOC CSW spoke with pts father/Jesse Ogan [phone] in regards to pts care.  Denyse Amass has stated that at this time he and his wife are not prepared to care for pt. Pt has a sister/ Kathlyn [place] and they do not get along.  Pts sister is struggling financially and can not assist pt with help.  Denyse Amass states he will connect with CSW tomorrow on a dc plan once he speaks with his wife who is returning home from out of town.  CSW will continue to follow for dc needs.  Marcus Groll Tarpley-Carter, MSW, LCSW-A Pronouns:  She, Her, Hers                  [place] ED Transitions of CareClinical Social Worker [email or web address] [phone]

## 2020-10-07 NOTE — Consult Note (Signed)
Psychiatrically cleared, TOC referral.  Waylan Boga, PMHNP

## 2020-10-07 NOTE — ED Notes (Signed)
Pt has not slept all night, becoming anxious/agitated, crying and repeating self over and over.  Medications given. Lorrin Mais and ativan-see MAR).

## 2020-10-07 NOTE — Progress Notes (Signed)
Kaitlyn Duncan [phone]  [name], patient is currently sleeping.

## 2020-10-07 NOTE — Consult Note (Signed)
[facility] Face-to-Face Psychiatry Consult   Reason for Consult:  Disorientation related to brain cancer Referring Physician:  EDP Patient Identification: Kaitlyn Duncan MRN:  [id number] Principal Diagnosis: Adjustment disorder with mixed disturbance of emotions and conduct Diagnosis:  Principal Problem:   Adjustment disorder with mixed disturbance of emotions and conduct   Total Time spent with patient: 30 minutes  Subjective:   Kaitlyn Duncan is a 50 y.o. female patient admitted to the ED for confusion.  HPI:  50 yo female with brain cancer, difficulty caring for herself and living in a hotel.  Pleasant and cooperative, no suicidal/homicidal ideations,hallucinations, or substance abuse. She is logical and knows she has an oncology appointment today at the cancer center.  Feels she needs her phone to contact friends, no family support in the area.  Psychiatrically cleared and will refer to the social worker for resources [facility], home health, housing, etc.).  Past Psychiatric History: none  Risk to Self:  not psychiatrically, decrease ability to physically care for herself Risk to Others:  none Prior Inpatient Therapy:  none Prior Outpatient Therapy:  none  Past Medical History:  Past Medical History:  Diagnosis Date  . Anemia   . Anemia   . Diabetes mellitus without complication (HCC)    prediabetic  . GERD (gastroesophageal reflux disease)   . melanoma with met dz dx'd 01/2018   brain and adrenal  . Seasonal allergies     Past Surgical History:  Procedure Laterality Date  . BIOPSY  01/28/2018   Procedure: BIOPSY;  Surgeon: Laurence Spates, MD;  Location: [facility];  Service: Endoscopy;;  . BREAST BIOPSY Left   . ESOPHAGOGASTRODUODENOSCOPY N/A 01/28/2018   Procedure: ESOPHAGOGASTRODUODENOSCOPY (EGD);  Surgeon: Laurence Spates, MD;  Location: [facility];  Service: Endoscopy;  Laterality: N/A;  . MOUTH SURGERY    . OVARY SURGERY     removed "something"   Family  History:  Family History  Problem Relation Age of Onset  . Hypertension Father   . Breast cancer Maternal Aunt   . Breast cancer Paternal Aunt   . Diabetes Paternal Aunt   . Breast cancer Maternal Grandmother   . Breast cancer Paternal Grandmother   . Diabetes Paternal Grandmother   . Melanoma Mother   . Colon cancer Neg Hx   . Esophageal cancer Neg Hx   . Pancreatic cancer Neg Hx   . Stomach cancer Neg Hx   . Liver disease Neg Hx    Family Psychiatric  History: none Social History:  Social History   Substance and Sexual Activity  Alcohol Use No     Social History   Substance and Sexual Activity  Drug Use No    Social History   Socioeconomic History  . Marital status: Single    Spouse name: Not on file  . Number of children: Not on file  . Years of education: Not on file  . Highest education level: Not on file  Occupational History  . Not on file  Tobacco Use  . Smoking status: Never Smoker  . Smokeless tobacco: Never Used  Vaping Use  . Vaping Use: Never used  Substance and Sexual Activity  . Alcohol use: No  . Drug use: No  . Sexual activity: Not Currently  Other Topics Concern  . Not on file  Social History Narrative  . Not on file   Social Determinants of Health   Financial Resource Strain: Not on file  Food Insecurity: Not on file  Transportation Needs:  Not on file  Physical Activity: Not on file  Stress: Not on file  Social Connections: Not on file   Additional Social History:    Allergies:  No Known Allergies  Labs:  Results for orders placed or performed during the hospital encounter of 10/05/20 (from the past 48 hour(s))  Comprehensive metabolic panel     Status: Abnormal   Collection Time: 10/05/20  2:22 PM  Result Value Ref Range   Sodium 139 135 - 145 mmol/L   Potassium 3.9 3.5 - 5.1 mmol/L   Chloride 106 98 - 111 mmol/L   CO2 22 22 - 32 mmol/L   Glucose, Bld 128 (H) 70 - 99 mg/dL    Comment: Glucose reference range applies only  to samples taken after fasting for at least 8 hours.   BUN 14 6 - 20 mg/dL   Creatinine, Ser 0.70 0.44 - 1.00 mg/dL   Calcium 9.6 8.9 - 10.3 mg/dL   Total Protein 8.3 (H) 6.5 - 8.1 g/dL   Albumin 5.0 3.5 - 5.0 g/dL   AST 27 15 - 41 U/L   ALT 17 0 - 44 U/L   Alkaline Phosphatase 74 38 - 126 U/L   Total Bilirubin 0.5 0.3 - 1.2 mg/dL   GFR, Estimated >60 >60 mL/min    Comment: (NOTE) Calculated using the CKD-EPI Creatinine Equation (2021)    Anion gap 11 5 - 15    Comment: Performed at [facility], [place] [address], [place], [place] [address]  CBC WITH DIFFERENTIAL     Status: Abnormal   Collection Time: 10/05/20  2:22 PM  Result Value Ref Range   WBC 4.2 4.0 - 10.5 K/uL   RBC 5.12 (H) 3.87 - 5.11 MIL/uL   Hemoglobin 14.2 12.0 - 15.0 g/dL   HCT 44.4 36.0 - 46.0 %   MCV 86.7 80.0 - 100.0 fL   MCH 27.7 26.0 - 34.0 pg   MCHC 32.0 30.0 - 36.0 g/dL   RDW 12.3 11.5 - 15.5 %   Platelets 61 (L) 150 - 400 K/uL    Comment: SPECIMEN CHECKED FOR CLOTS Immature Platelet Fraction may be clinically indicated, consider ordering this additional test [id number] REPEATED TO VERIFY PLATELET COUNT CONFIRMED BY SMEAR    nRBC 0.0 0.0 - 0.2 %   Neutrophils Relative % 74 %   Neutro Abs 3.1 1.7 - 7.7 K/uL   Lymphocytes Relative 19 %   Lymphs Abs 0.8 0.7 - 4.0 K/uL   Monocytes Relative 7 %   Monocytes Absolute 0.3 0.1 - 1.0 K/uL   Eosinophils Relative 0 %   Eosinophils Absolute 0.0 0.0 - 0.5 K/uL   Basophils Relative 0 %   Basophils Absolute 0.0 0.0 - 0.1 K/uL   Immature Granulocytes 0 %   Abs Immature Granulocytes 0.01 0.00 - 0.07 K/uL   Reactive, Benign Lymphocytes PRESENT    Ovalocytes PRESENT     Comment: Performed at [facility], [place] [address] [place], [place] [address]  Ammonia     Status: None   Collection Time: 10/05/20  2:22 PM  Result Value Ref Range   Ammonia 23 9 - 35 umol/L    Comment: Performed at [facility], [place]  [address] [place], [place] [address]  Ethanol     Status: None   Collection Time: 10/05/20  2:22 PM  Result Value Ref Range   Alcohol, Ethyl (B) <10 <10 mg/dL    Comment: (NOTE) Lowest detectable limit  for serum alcohol is 10 mg/dL.  For medical purposes only. Performed at [facility], [place] [address] [place], [place] [address]   CBG monitoring, ED     Status: Abnormal   Collection Time: 10/05/20  2:23 PM  Result Value Ref Range   Glucose-Capillary 117 (H) 70 - 99 mg/dL    Comment: Glucose reference range applies only to samples taken after fasting for at least 8 hours.  I-Stat beta hCG blood, ED     Status: None   Collection Time: 10/05/20  2:25 PM  Result Value Ref Range   I-stat hCG, quantitative <5.0 <5 mIU/mL   Comment 3            Comment:   GEST. AGE      CONC.  (mIU/mL)   <=1 WEEK        5 - 50     2 WEEKS       50 - 500     3 WEEKS       100 - 10,000     4 WEEKS     1,000 - 30,000        FEMALE AND NON-PREGNANT FEMALE:     LESS THAN 5 mIU/mL   Urinalysis, Complete w Microscopic     Status: Abnormal   Collection Time: 10/05/20  3:30 PM  Result Value Ref Range   Color, Urine STRAW (A) YELLOW   APPearance CLEAR CLEAR   Specific Gravity, Urine 1.006 1.005 - 1.030   pH 8.0 5.0 - 8.0   Glucose, UA NEGATIVE NEGATIVE mg/dL   Hgb urine dipstick NEGATIVE NEGATIVE   Bilirubin Urine NEGATIVE NEGATIVE   Ketones, ur NEGATIVE NEGATIVE mg/dL   Protein, ur NEGATIVE NEGATIVE mg/dL   Nitrite NEGATIVE NEGATIVE   Leukocytes,Ua NEGATIVE NEGATIVE   RBC / HPF 0-5 0 - 5 RBC/hpf   WBC, UA 0-5 0 - 5 WBC/hpf   Bacteria, UA NONE SEEN NONE SEEN    Comment: Performed at [facility], [place] [address], [place], [place] [address]  Urine rapid drug screen (hosp performed)     Status: None   Collection Time: 10/05/20  3:30 PM  Result Value Ref Range   Opiates NONE DETECTED NONE DETECTED   Cocaine NONE DETECTED NONE DETECTED   Benzodiazepines NONE DETECTED  NONE DETECTED   Amphetamines NONE DETECTED NONE DETECTED   Tetrahydrocannabinol NONE DETECTED NONE DETECTED   Barbiturates NONE DETECTED NONE DETECTED    Comment: (NOTE) DRUG SCREEN FOR MEDICAL PURPOSES ONLY.  IF CONFIRMATION IS NEEDED FOR ANY PURPOSE, NOTIFY LAB WITHIN 5 DAYS.  LOWEST DETECTABLE LIMITS FOR URINE DRUG SCREEN Drug Class                     Cutoff (ng/mL) Amphetamine and metabolites    1000 Barbiturate and metabolites    200 Benzodiazepine                 932 Tricyclics and metabolites     300 Opiates and metabolites        300 Cocaine and metabolites        300 THC                            50 Performed at [facility], [place] [address], [place], [place] [address]   Resp Panel by RT-PCR (Flu A&B, Covid) Nasopharyngeal Swab     Status: None  Collection Time: 10/05/20  8:22 PM   Specimen: Nasopharyngeal Swab; Nasopharyngeal(NP) swabs in vial transport medium  Result Value Ref Range   SARS Coronavirus 2 by RT PCR NEGATIVE NEGATIVE    Comment: (NOTE) SARS-CoV-2 target nucleic acids are NOT DETECTED.  The SARS-CoV-2 RNA is generally detectable in upper respiratory specimens during the acute phase of infection. The lowest concentration of SARS-CoV-2 viral copies this assay can detect is 138 copies/mL. A negative result does not preclude SARS-Cov-2 infection and should not be used as the sole basis for treatment or other patient management decisions. A negative result may occur with  improper specimen collection/handling, submission of specimen other than nasopharyngeal swab, presence of viral mutation(s) within the areas targeted by this assay, and inadequate number of viral copies(<138 copies/mL). A negative result must be combined with clinical observations, patient history, and epidemiological information. The expected result is Negative.  Fact Sheet for Patients:  EntrepreneurPulse.com.au  Fact Sheet for Healthcare  Providers:  IncredibleEmployment.be  This test is no t yet approved or cleared by the [place] FDA and  has been authorized for detection and/or diagnosis of SARS-CoV-2 by FDA under an Emergency Use Authorization (EUA). This EUA will remain  in effect (meaning this test can be used) for the duration of the COVID-19 declaration under Section 564(b)(1) of the Act, 21 U.S.C.section 360bbb-3(b)(1), unless the authorization is terminated  or revoked sooner.       Influenza A by PCR NEGATIVE NEGATIVE   Influenza B by PCR NEGATIVE NEGATIVE    Comment: (NOTE) The Xpert Xpress SARS-CoV-2/FLU/RSV plus assay is intended as an aid in the diagnosis of influenza from Nasopharyngeal swab specimens and should not be used as a sole basis for treatment. Nasal washings and aspirates are unacceptable for Xpert Xpress SARS-CoV-2/FLU/RSV testing.  Fact Sheet for Patients: EntrepreneurPulse.com.au  Fact Sheet for Healthcare Providers: IncredibleEmployment.be  This test is not yet approved or cleared by the [place] FDA and has been authorized for detection and/or diagnosis of SARS-CoV-2 by FDA under an Emergency Use Authorization (EUA). This EUA will remain in effect (meaning this test can be used) for the duration of the COVID-19 declaration under Section 564(b)(1) of the Act, 21 U.S.C. section 360bbb-3(b)(1), unless the authorization is terminated or revoked.  Performed at [facility], [place] [address], [place], [place] [address]     Current Facility-Administered Medications  Medication Dose Route Frequency Provider Last Rate Last Admin  . acetaminophen (TYLENOL) tablet 650 mg  650 mg Oral Q4H PRN Lucrezia Starch, MD      . OLANZapine zydis (ZYPREXA) disintegrating tablet 5 mg  5 mg Oral Q8H PRN Lucrezia Starch, MD      . ondansetron [facility]) tablet 4 mg  4 mg Oral Q8H PRN Lucrezia Starch, MD      .  zolpidem (AMBIEN) tablet 5 mg  5 mg Oral QHS PRN Lucrezia Starch, MD   5 mg at 10/07/20 4128   Current Outpatient Medications  Medication Sig Dispense Refill  . Acetaminophen 500 MG capsule Take 1,000 mg by mouth every 6 (six) hours as needed for fever or pain.    . COLLAGEN PO Take 1 tablet by mouth daily.    . Lactobacillus (PROBIOTIC ACIDOPHILUS PO) Take 2 Doses by mouth daily.     Marland Kitchen loratadine (CLARITIN) 10 MG tablet Take 10 mg by mouth daily.    . Milk Thistle 175 MG CAPS Take 175 mg by mouth daily.    Marland Kitchen  Multiple Vitamin (MULTIVITAMIN WITH MINERALS) TABS tablet Take 1 tablet by mouth daily.    . solifenacin (VESICARE) 5 MG tablet TAKE 1 TABLET BY MOUTH DAILY. (Patient taking differently: Take 5 mg by mouth daily.) 30 tablet 5  . TURMERIC PO Take 1 tablet by mouth daily.    . mupirocin ointment (BACTROBAN) 2 % APPLY INTO THE NOSE AS DIRECTED TWICE DAILY (Patient not taking: Reported on 10/06/2020) 22 g 1    Musculoskeletal: Strength & Muscle Tone: within normal limits Gait & Station: normal Patient leans: N/A   Psychiatric Specialty Exam: Physical Exam Vitals and nursing note reviewed.  Constitutional:      Appearance: Normal appearance.  HENT:     Head: Normocephalic.     Nose: Nose normal.  Pulmonary:     Effort: Pulmonary effort is normal.  Musculoskeletal:        General: Normal range of motion.     Cervical back: Normal range of motion.  Neurological:     General: No focal deficit present.     Mental Status: She is alert and oriented to person, place, and time.  Psychiatric:        Attention and Perception: Attention and perception normal.        Mood and Affect: Affect normal. Mood is anxious.        Speech: Speech normal.        Behavior: Behavior normal. Behavior is cooperative.        Thought Content: Thought content normal.        Cognition and Memory: Cognition and memory normal.        Judgment: Judgment normal.     Review of Systems   Psychiatric/Behavioral: The patient is nervous/anxious.   All other systems reviewed and are negative.   Blood pressure (!) 133/99, pulse 87, temperature 97.8 F (36.6 C), temperature source Oral, resp. rate 16, SpO2 97 %.There is no height or weight on file to calculate BMI.  General Appearance: Casual  Eye Contact:  Good  Speech:  Normal Rate  Volume:  Normal  Mood:  Anxious  Affect:  Congruent  Thought Process:  Coherent and Descriptions of Associations: Intact  Orientation:  Full (Time, Place, and Person)  Thought Content:  WDL and Logical  Suicidal Thoughts:  No  Homicidal Thoughts:  No  Memory:  Immediate;   Fair Recent;   Fair Remote;   Fair  Judgement:  Fair  Insight:  Fair  Psychomotor Activity:  Decreased  Concentration:  Concentration: Fair and Attention Span: Fair  Recall:  [employer] of Knowledge:  Fair  Language:  Good  Akathisia:  No  Handed:  Right  AIMS (if indicated):     Assets:  Leisure Time Resilience  ADL's:  Intact  Cognition:  WNL  Sleep:      Physical Exam: Physical Exam Vitals and nursing note reviewed.  Constitutional:      Appearance: Normal appearance.  HENT:     Head: Normocephalic.     Nose: Nose normal.  Pulmonary:     Effort: Pulmonary effort is normal.  Musculoskeletal:        General: Normal range of motion.     Cervical back: Normal range of motion.  Neurological:     General: No focal deficit present.     Mental Status: She is alert and oriented to person, place, and time.  Psychiatric:        Attention and Perception: Attention and perception normal.  Mood and Affect: Affect normal. Mood is anxious.        Speech: Speech normal.        Behavior: Behavior normal. Behavior is cooperative.        Thought Content: Thought content normal.        Cognition and Memory: Cognition and memory normal.        Judgment: Judgment normal.    Review of Systems  Psychiatric/Behavioral: The patient is nervous/anxious.   All  other systems reviewed and are negative.  Blood pressure (!) 133/99, pulse 87, temperature 97.8 F (36.6 C), temperature source Oral, resp. rate 16, SpO2 97 %. There is no height or weight on file to calculate BMI.  Treatment Plan Summary: Adjustment disorder with mixed disturbance of emotions and conduct: -Referral to outpatient at [facility] for therapy  TOC consult: -Housing and home health needs   Disposition: No evidence of imminent risk to self or others at present.    Waylan Boga, NP 10/07/2020 10:48 AM

## 2020-10-07 NOTE — ED Notes (Signed)
patient called spoke with friend AJ on the phone

## 2020-10-07 NOTE — Telephone Encounter (Signed)
Patient was scheduled for MD visit with Dr. Mickeal Skinner today but is currently in the ED. Dr. Mickeal Skinner made aware. Per Dr. Mickeal Skinner, patient to follow-up with office once she has been discharged from ED. If ED physician would like Dr. Mickeal Skinner to be consulted for current visit, a request for consult must be entered.  Spoke to [employer], Therapist, sports, patient's current RN in ED and informed her of Dr. Renda Rolls response.

## 2020-10-07 NOTE — ED Notes (Signed)
pt acting out, exit seeking, running, yelling and crying. Medicated per physician orders. Physican notifed

## 2020-10-07 NOTE — ED Provider Notes (Addendum)
Emergency Medicine Observation Re-evaluation Note  Kaitlyn Duncan is a 50 y.o. female, seen on rounds today.  Pt initially presented to the ED for complaints of Altered Mental Status Currently, the patient is resting and calm.  Physical Exam  BP (!) 133/99 (BP Location: Right Arm)   Pulse 87   Temp 97.8 F (36.6 C) (Oral)   Resp 16   SpO2 97%  Physical Exam General: Calm, resting Cardiac: Regular rate and rhythm Lungs: Wheezing easily Psych: Normal mood  ED Course / MDM  EKG:EKG Interpretation  Date/Time:  Sunday October 05 2020 15:26:54 EDT Ventricular Rate:  92 PR Interval:  140 QRS Duration: 96 QT Interval:  358 QTC Calculation: 443 R Axis:   95 Text Interpretation: Sinus rhythm Borderline right axis deviation Confirmed by Madalyn Rob [phone]) on 10/05/2020 3:33:13 PM   I have reviewed the labs performed to date as well as medications administered while in observation.  Recent changes in the last 24 hours include no acute changes.  Patient has remained stable Plan  Current plan is for patient treatment.    Dorie Rank, MD 10/07/20 [phone]  Pt was seen by psychiatry and was cleared.  Pt has continued to have behavioral issues and has required medications for agitation.  No findings of acute medical process.   Will consult with her oncology team.   Dorie Rank, MD 10/07/20 1233

## 2020-10-07 NOTE — TOC Initial Note (Addendum)
Transition of Care [facility]) - Initial/Assessment Note    Patient Details  Name: Kaitlyn Duncan MRN: [id number] Date of Birth: Nov 02, 1970  [facility]) CM/SW Contact:    Erenest Rasher, RN Phone Number: [phone] 10/07/2020, 7:13 PM  Clinical Narrative:                  TOC CM spoke to pt and states she was staying in [facility], states she needs to find another hotel but hotels are full due to pending [employer]. The rates have been increased and she is not able to afford. Gave permission to contact her father, sister, friend, Selinda Michaels [phone], and friend, [employer]ana [employer] [phone]. Contacted her friend, Eustaquio Maize and states she is unable to assist with getting her discount to hotel. States she works with [employer] but due to furniture market they cannot offer discount. Contacted friend, AJ and left message. Pt states she wants to go to [place] to see her father or sister. TOC CSW will contact pt's family to see if she can discharge to father or sister.    Barriers to Discharge: SNF Pending bed offer   Patient Goals and CMS Choice        Expected Discharge Plan and Services  Prior Living Arrangements/Services  Activities of Daily Living      Permission Sought/Granted  Emotional Assessment  Admission diagnosis:  AMS Patient Active Problem List   Diagnosis Date Noted  . 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 Melanoma of skin ([place]) 05/29/2018  . Goals of care, counseling/discussion 05/29/2018  . Brain metastasis ([place]) 01/26/2018   PCP:  [name], NP Pharmacy:   [facility] [place] [address] Phone: [phone] Fax: [phone]  [place], [place] [place]. [place] [address] Phone: [phone] Fax: [phone]     Social Determinants of Health (SDOH)  Interventions    Readmission Risk Interventions No flowsheet data found.

## 2020-10-08 DIAGNOSIS — F29 Unspecified psychosis not due to a substance or known physiological condition: Secondary | ICD-10-CM | POA: Diagnosis not present

## 2020-10-08 NOTE — ED Provider Notes (Addendum)
Emergency Medicine Observation Re-evaluation Note  Arnita Koons is a 50 y.o. female, seen on rounds today.  Pt initially presented to the ED for complaints of Altered Mental Status Currently, the patient is sleeping in bed.  Physical Exam  BP (!) 129/91 (BP Location: Right Arm)   Pulse 78   Temp 98.4 F (36.9 C) (Oral)   Resp 14   SpO2 100%  Physical Exam General: resting comfortably, NAD Lungs: normal WOB Psych: currently calm and resting  ED Course / MDM  EKG:EKG Interpretation  Date/Time:  Sunday October 05 2020 15:26:54 EDT Ventricular Rate:  92 PR Interval:  140 QRS Duration: 96 QT Interval:  358 QTC Calculation: 443 R Axis:   95 Text Interpretation: Sinus rhythm Borderline right axis deviation Confirmed by Madalyn Rob [phone]) on 10/05/2020 3:33:13 PM   I have reviewed the labs performed to date as well as medications administered while in observation.  Recent changes in the last 24 hours include none.  Plan  Current plan is for disposition per social work. She has been psychiatrically cleared.   Lorelle Gibbs, DO 10/08/20 [place], [place], DO 10/08/20 1449

## 2020-10-08 NOTE — ED Provider Notes (Signed)
Patient has been psychiatrically and medically cleared.  Social work is been working with the patient for a safe discharge plan.  As of right now she is going to be discharged back to her extended stay.  Social work has been in contact with her father who is going to arrive to transport her back to [place].  Safe plan is in place.  Patient is baseline and appropriate for discharge.   Lorelle Gibbs, DO 10/08/20 1450

## 2020-10-08 NOTE — Progress Notes (Signed)
TOC CSW filed an APS report with [place] DSS/APS.  Jenilee Franey was aware of this.  He asked that CSW contact APS.  Denyse Amass was also notified by CSW that pt would return to Extended Stay ([address], [place], [place] stated he was okay with that.  That he and his wife would be arriving here on 4/7 or 10/10/2020 to take pt back to [place].    CSW will continue to follow for dc needs.  Anjoli Diemer Tarpley-Carter, MSW, LCSW-A Pronouns:  She, Her, [place] ED Transitions of CareClinical Social Worker [email or web address] [phone]

## 2020-10-08 NOTE — ED Notes (Signed)
Pt DC off unit to home per provider. Pt alert, oriented , calm, cooperative, no s/s of distress. DC information and belongings given to pt. Pt ambulatory off unit, escorted by NT. Pt transported by [employer].

## 2020-10-08 NOTE — Discharge Instructions (Signed)
You have been seen and discharged from the emergency department.  Follow-up with your primary provider for reevaluation and further care. Take home medications as prescribed. If you have any worsening symptoms or further concerns for health please return to an emergency department for further evaluation.

## 2020-10-08 NOTE — Evaluation (Addendum)
10/08/2020, 10:21 AM Physical Therapy Evaluation Patient Details Name: Stepahnie Campo MRN: [id number] DOB: Aug 23, 1970 Today's Date: 10/08/2020   History of Present Illness  50 y.o. female brought to ED involuntary by police, she was found wandering in traffic, stated she was being chased by demons. In ED she had episodes of yelling, crying, exit seeking. Pt cleared by psychiatry. CT of brain negative for acute changes. 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  Clinical Impression  During PT evaluation, pt demonstrated independence with all observed mobility. She ambulated in the hallway ~240' without an assistive device with no loss of balance with balance challenges. She is able to reach to the floor without loss of balance. BLE Strength/ROM/sensation are all WNL. Pt reports no difficulty with UE fine motor control (she was observed opening a water bottle independently and was able to go to the bathroom without assistance).  Pt was oriented to self, location, month/year and to the current president. No further PT indicated, will sign off.     Follow Up Recommendations No PT follow up    Equipment Recommendations  None recommended by PT    Recommendations for Other Services       Precautions / Restrictions Precautions Precautions: None Restrictions Weight Bearing Restrictions: No      Mobility  Bed Mobility Overal bed mobility: Independent                  Transfers Overall transfer level: Independent                  Ambulation/Gait Ambulation/Gait assistance: Independent Gait Distance (Feet): 240 Feet Assistive device: None Gait Pattern/deviations: WFL(Within Functional Limits) Gait velocity: WNL   General Gait Details: no loss of balance with head turns to L/R nor with looking up/down, able to stop/start quickly without loss of balance, able  to reach to floor in standing without loss of balance  Stairs            Wheelchair Mobility    Modified Rankin (Stroke Patients Only)       Balance Overall balance assessment: Independent                                           Pertinent Vitals/Pain Pain Assessment: No/denies pain    Home Living Family/patient expects to be discharged to:: Unsure                 Additional Comments: was staying in an Extended Stay hotel for 3 years PTA but cannot return there, family stated they are not able/willing to care for her    Prior Function Level of Independence: Independent         Comments: walks without AD, denied falls in past 6 months, enjoyes reading and doing art     Hand Dominance        Extremity/Trunk Assessment   Upper Extremity Assessment Upper Extremity Assessment: Overall WFL for tasks assessed    Lower Extremity Assessment Lower Extremity Assessment: Overall WFL for tasks assessed    Cervical / Trunk Assessment Cervical / Trunk Assessment: Normal  Communication   Communication: No difficulties  Cognition Arousal/Alertness: Awake/alert Behavior During Therapy: WFL for tasks assessed/performed Overall Cognitive Status: Within Functional Limits for tasks assessed  General Comments: oriented to month/year, able to state current president, follows commands without difficulty      General Comments      Exercises     Assessment/Plan    PT Assessment Patent does not need any further PT services  PT Problem List         PT Treatment Interventions      PT Goals (Current goals can be found in the Care Plan section)  Acute Rehab PT Goals Patient Stated Goal: likes to read, do art PT Goal Formulation: All assessment and education complete, DC therapy    Frequency     Barriers to discharge        Co-evaluation               AM-PAC PT "6 Clicks" Mobility   Outcome Measure Help needed turning from your back to your side while in a flat bed without using bedrails?: None Help needed moving from lying on your back to sitting on the side of a flat bed without using bedrails?: None Help needed moving to and from a bed to a chair (including a wheelchair)?: None Help needed standing up from a chair using your arms (e.g., wheelchair or bedside chair)?: None Help needed to walk in hospital room?: None Help needed climbing 3-5 steps with a railing? : None 6 Click Score: 24    End of Session Equipment Utilized During Treatment: Gait belt Activity Tolerance: Patient tolerated treatment well Patient left: in chair;with call bell/phone within reach;Other (comment) (sitter sitting by door to room) Nurse Communication: Mobility status      Time: 0941-1000 PT Time Calculation (min) (ACUTE ONLY): 19 min   Charges:   PT Evaluation $PT Eval Low Complexity: 1 Low          Blondell Reveal Kistler PT 10/08/2020  Acute Rehabilitation Services Pager [phone] Office [phone]

## 2020-10-08 NOTE — Progress Notes (Signed)
TOC CSW attempted to contact [place] [phone].  CSW left HIPPA compliant message with my contact information.  CSW will continue to follow for dc needs.  Zannie Runkle Tarpley-Carter, MSW, LCSW-A Pronouns:  She, Her, Hers                  [place] [facility] Transitions of CareClinical Social Worker [email or web address] [phone]

## 2020-10-09 NOTE — Progress Notes (Signed)
10/09/2020 956 am TOC CM received a call from pt's father and updated him on her location at [employer] on [address]. States he received a text from her today. States he will discuss with her coming back to [place]. They have not decided if they will travel to [facility]. States they are concerned about her belongings at Extended [facility]. He has contact info for manager at hotel and will follow up with her. States pt has been at hotel for 2 years. [place], [place] ED TOC CM [phone]

## 2020-10-09 NOTE — Progress Notes (Addendum)
TOC CM spoke to pt and she does not want to dc back to Extended [facility]. [facility] and they have availability. Pt completed her hold for room with hotel and will dc to [facility]. Pt completed [facility] Waiver and scanned to [employer]. Pt wants to eat her dinner. ED RN will call ride when she has completed meal. Jonnie Finner RN [place], [place] ED TOC CM [phone]

## 2020-10-10 ENCOUNTER — Other Ambulatory Visit: Payer: Self-pay

## 2020-10-10 ENCOUNTER — Emergency Department (HOSPITAL_COMMUNITY): Payer: Medicare Other

## 2020-10-10 ENCOUNTER — Emergency Department (HOSPITAL_COMMUNITY)
Admission: EM | Admit: 2020-10-10 | Discharge: 2020-10-12 | Disposition: A | Discharge status: Home / Self Care | Payer: Medicare Other | Attending: Emergency Medicine | Admitting: Emergency Medicine

## 2020-10-10 ENCOUNTER — Encounter (HOSPITAL_COMMUNITY): Payer: Self-pay | Admitting: Emergency Medicine

## 2020-10-10 DIAGNOSIS — R4182 Altered mental status, unspecified: Secondary | ICD-10-CM | POA: Insufficient documentation

## 2020-10-10 DIAGNOSIS — F4325 Adjustment disorder with mixed disturbance of emotions and conduct: Secondary | ICD-10-CM | POA: Diagnosis present

## 2020-10-10 DIAGNOSIS — Z046 Encounter for general psychiatric examination, requested by authority: Secondary | ICD-10-CM | POA: Diagnosis present

## 2020-10-10 DIAGNOSIS — C439 Malignant melanoma of skin, unspecified: Secondary | ICD-10-CM | POA: Diagnosis present

## 2020-10-10 DIAGNOSIS — F600 Paranoid personality disorder: Secondary | ICD-10-CM | POA: Diagnosis not present

## 2020-10-10 DIAGNOSIS — Z7189 Other specified counseling: Secondary | ICD-10-CM | POA: Diagnosis not present

## 2020-10-10 DIAGNOSIS — E119 Type 2 diabetes mellitus without complications: Secondary | ICD-10-CM | POA: Insufficient documentation

## 2020-10-10 DIAGNOSIS — F22 Delusional disorders: Secondary | ICD-10-CM | POA: Insufficient documentation

## 2020-10-10 DIAGNOSIS — C7931 Secondary malignant neoplasm of brain: Secondary | ICD-10-CM | POA: Diagnosis present

## 2020-10-10 DIAGNOSIS — R000 Tachycardia, unspecified: Secondary | ICD-10-CM | POA: Diagnosis not present

## 2020-10-10 DIAGNOSIS — Z20822 Contact with and (suspected) exposure to covid-19: Secondary | ICD-10-CM | POA: Diagnosis not present

## 2020-10-10 LAB — URINALYSIS, ROUTINE W REFLEX MICROSCOPIC
Bacteria, UA: NONE SEEN
Bilirubin Urine: NEGATIVE
Glucose, UA: NEGATIVE mg/dL
Hgb urine dipstick: NEGATIVE
Ketones, ur: 5 mg/dL — AB
Leukocytes,Ua: NEGATIVE
Nitrite: NEGATIVE
Protein, ur: 30 mg/dL — AB
Specific Gravity, Urine: 1.019 (ref 1.005–1.030)
pH: 6 (ref 5.0–8.0)

## 2020-10-10 LAB — CBC WITH DIFFERENTIAL/PLATELET
Abs Immature Granulocytes: 0.02 10*3/uL (ref 0.00–0.07)
Basophils Absolute: 0 10*3/uL (ref 0.0–0.1)
Basophils Relative: 0 %
Eosinophils Absolute: 0 10*3/uL (ref 0.0–0.5)
Eosinophils Relative: 0 %
HCT: 42.5 % (ref 36.0–46.0)
Hemoglobin: 13.3 g/dL (ref 12.0–15.0)
Immature Granulocytes: 0 %
Lymphocytes Relative: 14 %
Lymphs Abs: 1.2 10*3/uL (ref 0.7–4.0)
MCH: 27.9 pg (ref 26.0–34.0)
MCHC: 31.3 g/dL (ref 30.0–36.0)
MCV: 89.3 fL (ref 80.0–100.0)
Monocytes Absolute: 0.6 10*3/uL (ref 0.1–1.0)
Monocytes Relative: 7 %
Neutro Abs: 6.7 10*3/uL (ref 1.7–7.7)
Neutrophils Relative %: 79 %
Platelets: 89 10*3/uL — ABNORMAL LOW (ref 150–400)
RBC: 4.76 MIL/uL (ref 3.87–5.11)
RDW: 12.4 % (ref 11.5–15.5)
WBC: 8.5 10*3/uL (ref 4.0–10.5)
nRBC: 0 % (ref 0.0–0.2)

## 2020-10-10 LAB — ACETAMINOPHEN LEVEL: Acetaminophen (Tylenol), Serum: <10 ug/mL — ABNORMAL LOW (ref 10–30)

## 2020-10-10 LAB — BASIC METABOLIC PANEL
Anion gap: 17 — ABNORMAL HIGH (ref 5–15)
BUN: 34 mg/dL — ABNORMAL HIGH (ref 6–20)
CO2: 19 mmol/L — ABNORMAL LOW (ref 22–32)
Calcium: 9.5 mg/dL (ref 8.9–10.3)
Chloride: 105 mmol/L (ref 98–111)
Creatinine, Ser: 0.84 mg/dL (ref 0.44–1.00)
GFR, Estimated: >60 mL/min (ref >60)
Glucose, Bld: 120 mg/dL — ABNORMAL HIGH (ref 70–99)
Potassium: 3.7 mmol/L (ref 3.5–5.1)
Sodium: 141 mmol/L (ref 135–145)

## 2020-10-10 LAB — RAPID URINE DRUG SCREEN, HOSP PERFORMED
Amphetamines: NOT DETECTED
Barbiturates: NOT DETECTED
Benzodiazepines: NOT DETECTED
Cocaine: NOT DETECTED
Opiates: NOT DETECTED
Tetrahydrocannabinol: NOT DETECTED

## 2020-10-10 LAB — SALICYLATE LEVEL: Salicylate Lvl: <7 mg/dL — ABNORMAL LOW (ref 7.0–30.0)

## 2020-10-10 LAB — RESP PANEL BY RT-PCR (FLU A&B, COVID) ARPGX2
Influenza A by PCR: NEGATIVE
Influenza B by PCR: NEGATIVE
SARS Coronavirus 2 by RT PCR: NEGATIVE

## 2020-10-10 LAB — AMMONIA: Ammonia: 47 umol/L — ABNORMAL HIGH (ref 9–35)

## 2020-10-10 LAB — ETHANOL: Alcohol, Ethyl (B): <10 mg/dL (ref <10)

## 2020-10-10 MED ORDER — SODIUM CHLORIDE 0.9 % IV BOLUS
1000.0000 mL | Freq: Once | INTRAVENOUS | Status: AC
  Administered 2020-10-10: 1000 mL via INTRAVENOUS

## 2020-10-10 MED ORDER — HALOPERIDOL LACTATE 5 MG/ML IJ SOLN
2.0000 mg | Freq: Once | INTRAMUSCULAR | Status: AC
  Administered 2020-10-10: 2 mg via INTRAMUSCULAR
  Filled 2020-10-10: qty 1

## 2020-10-10 MED ORDER — LORAZEPAM 1 MG PO TABS: 1.0000 mg | ORAL_TABLET | ORAL | Status: DC | PRN

## 2020-10-10 MED ORDER — ADULT MULTIVITAMIN W/MINERALS CH: 1.0000 | ORAL_TABLET | Freq: Every day | ORAL | Status: DC

## 2020-10-10 MED ORDER — ACETAMINOPHEN 325 MG PO TABS
650.0000 mg | ORAL_TABLET | Freq: Four times a day (QID) | ORAL | Status: DC | PRN
  Administered 2020-10-12: 650 mg via ORAL
  Filled 2020-10-10: qty 2

## 2020-10-10 MED ORDER — LORAZEPAM 2 MG/ML IJ SOLN
1.0000 mg | Freq: Once | INTRAMUSCULAR | Status: AC
  Administered 2020-10-10: 1 mg via INTRAMUSCULAR
  Filled 2020-10-10: qty 1

## 2020-10-10 NOTE — ED Notes (Signed)
Sitter in room.

## 2020-10-10 NOTE — ED Triage Notes (Signed)
BIBA Per EMS:  Pt coming from side of road with paranoia stating she needs MRI. Pt concern for brain tumor. Pt holding head, praying and having disorganized thoughts. Hx of schizophrenia

## 2020-10-10 NOTE — ED Notes (Signed)
Pt fighting and pull off everything, was unable to get vitals

## 2020-10-10 NOTE — Progress Notes (Addendum)
1700 TOC CM received text message that they were traveling to [place] and will be here this evening. Texted him the address. And they will visit with pt while they are here and meet with her physicians. Plan is to take pt back to [place] and follow up with Oncology in [place]. Jonnie Finner RN CCM, [facility] ED TOC CM [phone]  402 pm TOC CM notified pt's father, Denyse Amass that she is currently in ED. Left message requesting a return call. [place], [place] ED TOC CM [phone]

## 2020-10-10 NOTE — BH Assessment (Signed)
TTS spoke with Mortimer Fries RN, was informed by Nurse that pt is taking a shower, will call back later.

## 2020-10-10 NOTE — ED Notes (Signed)
Pt alert x4 up watching t.v. I will continue to assess. Denies SI and HI at this time.

## 2020-10-10 NOTE — ED Notes (Signed)
ptatient IVC'd by Dr. Regenia Skeeter at 9:52am, and served by [facility] at 10:40am

## 2020-10-10 NOTE — ED Notes (Signed)
Patient ambulatory to TCU. Two patient belongings bags secured in locker 31.
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
## 2020-10-11 DIAGNOSIS — F600 Paranoid personality disorder: Secondary | ICD-10-CM | POA: Diagnosis not present

## 2020-10-11 MED ORDER — HALOPERIDOL LACTATE 5 MG/ML IJ SOLN
2.0000 mg | Freq: Once | INTRAMUSCULAR | Status: AC
  Administered 2020-10-11: 2 mg via INTRAMUSCULAR
  Filled 2020-10-11: qty 1

## 2020-10-11 MED ORDER — OLANZAPINE 5 MG PO TBDP
5.0000 mg | ORAL_TABLET | Freq: Every day | ORAL | Status: DC
  Filled 2020-10-11: qty 1

## 2020-10-11 MED ORDER — LORAZEPAM 2 MG/ML IJ SOLN
1.0000 mg | Freq: Once | INTRAMUSCULAR | Status: AC
  Administered 2020-10-11: 1 mg via INTRAMUSCULAR
  Filled 2020-10-11: qty 1

## 2020-10-11 NOTE — ED Provider Notes (Signed)
  Physical Exam  BP 133/90 (BP Location: Left Arm)   Pulse 86   Temp 98 F (36.7 C) (Oral)   Resp 18   SpO2 98%   Physical Exam  ED Course/Procedures   Clinical Course as of 10/11/20 0910  Fri Oct 10, 2020  1200 Patient reassessed. Sleeping soundly. Equal rise and fall to chest [BH]    Clinical Course User Index [BH] Henderly, Britni A, PA-C    Procedures  MDM  Patient's began yelling.  Refusing oral medication.  Will give IM Haldol and Ativan.      Davonna Belling, MD 10/11/20 [phone]

## 2020-10-11 NOTE — ED Notes (Signed)
Patient ambulating to bathroom at this time, stable gait
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
## 2020-10-11 NOTE — ED Notes (Signed)
Patient into hallway, sat on chair, informed that she cannot sit in hallway, back into room

## 2020-10-11 NOTE — Consult Note (Addendum)
Notified via secure chat that patient's father is at the hospital requesting to speak with team members including oncology and psychiatry to begin process of transferring patient back to [place].   Father: Sheria Rosello: [phone] States he's been with patient throughout the day, states pt appears calm and docile. States she does sound like herself when calm and was able to carry on a usual conversation. States plan is to take her back home to [place]. Says patient reports there are "demons here" and does not want to be in area.   Requesting to speak with all team members (oncology, psychiatry, and medical) to determine best setting. States patient is "a bit of a difficult personality anyway. Not sure if coming to our home is the decision to make, but maybe a facility close to our home is".   TOC/SW consult placed to assist with possible transfer to [place]. Medications started; Zyprexa 5mg  PO. Current recommendation for inpatient psychiatric hospitalization. Patient will continue to be assessed daily to monitor progress while disposition is being determined.

## 2020-10-11 NOTE — Progress Notes (Addendum)
401 pm TOC CM spoke to pt's father, Mr Gang, he is at pt's bedside, he wants to meet with pt's Oncologist, Dr Alen Blew to discuss pt dc back to [place] with him. He will need referral sent to Oncologist in [place]. He states they plan to stay in town until they can get things in place for pt. They are staying at the [facility] where she left her belongings and plan to move her belongings. TOC CM sent message to pt's Oncologist to arrange meeting or telephone meeting.    Jonnie Finner RN CCM, [facility] ED TOC CM [phone]  332 pm TOC CM spoke to pt today and she did have visit with her father today. Pt unclear of the plans and guarded when communicating to CM.   [place], [place] ED TOC CM [phone]

## 2020-10-11 NOTE — ED Notes (Signed)
Lunch tray given. 

## 2020-10-11 NOTE — ED Notes (Signed)
Family to bedside at this time.

## 2020-10-11 NOTE — ED Notes (Addendum)
Patient up into hallway, stating that the sitter has a dark vibe, room has a dark vibe, stating that she is leaving, [place] MD informed, medications ordered

## 2020-10-11 NOTE — ED Notes (Signed)
Patient provided with dinner tray.

## 2020-10-11 NOTE — ED Notes (Signed)
Kaitlyn Duncan, visitor, was wanded, he wanted to go through her belongings to find motel key, 1 motel key given to him, one kept in bag
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
## 2020-10-11 NOTE — ED Notes (Signed)
Patient ambulated to bathroom at this time, stable gait

## 2020-10-12 DIAGNOSIS — C7931 Secondary malignant neoplasm of brain: Secondary | ICD-10-CM | POA: Diagnosis not present

## 2020-10-12 DIAGNOSIS — F4325 Adjustment disorder with mixed disturbance of emotions and conduct: Secondary | ICD-10-CM | POA: Diagnosis not present

## 2020-10-12 DIAGNOSIS — C439 Malignant melanoma of skin, unspecified: Secondary | ICD-10-CM

## 2020-10-12 MED ORDER — QUETIAPINE FUMARATE 50 MG PO TABS: 50.0000 mg | ORAL_TABLET | Freq: Every day | ORAL | Status: DC

## 2020-10-12 NOTE — ED Notes (Signed)
Pt moved from ED exam Room 7 to ED exam room 10. Sitter at bedside with pt in direct line of sight. Report given to Ronalee Belts, [place].

## 2020-10-12 NOTE — ED Notes (Signed)
Pt had accidentally spilled orange juice on herself and on bed. This Probation officer changed the sheets of pt and her sitter changed her into new burgundy scrubs. Pt was asked if they would like a shower before changing into new scrubs, pt stated "I would prefer to take a shower later today". Sitter at bedside.

## 2020-10-12 NOTE — Consult Note (Signed)
Telepsych Consultation   Reason for Consult:  Psych consult Referring Physician: Shelby Dubin PA-C Location of Patient: Kaitlyn Duncan [id number] Location of Provider: [facility]  Patient Identification: Kaitlyn Duncan MRN:  [id number] Principal Diagnosis: Adjustment disorder with mixed disturbance of emotions and conduct Diagnosis:  Principal Problem:   Adjustment disorder with mixed disturbance of emotions and conduct Active Problems:   Brain metastasis ([place])   Melanoma of skin ([place])   Goals of care, counseling/discussion  Total Time spent with patient: 20 minutes  Subjective:   [name] is a 50 y.o. female patient admitted via IVC with altered mental status and paranoia after being found disoriented and naked on the side of the road.   On assessment patient presents calm and cooperative; flat affect, alert and oriented x2 (person, place). States she "doesn't remember" what brought her into the hospital; says she doesn't remember seeing any law enforcement or being naked. Skin is noticeably pale. Patient denies any thoughts of wanting to harm herself or others. Denies any psychiatric history. States she would like to return home to [place] with her parents. She denies any auditory or visual hallucinations and does not appear to be responding to any external/internal stimuli at this time.   HPI:   Kaitlyn Duncan is a 50 year old female who presented to [facility] via law enforcement via IVC with altered mental status and paranoia after being found naked on the side of the road. Patient has recent medical history of advanced cancer with brain metastasis. Parents currently in town with plans to take patient back to [place].   Past Psychiatric History:   -Adjustment disorder with mixed disturbance of   emotions and conduct  Risk to Self:  pt denies Risk to Others:  pt denies Prior Inpatient Therapy:  pt denies Prior Outpatient Therapy:  pt  denies  Past Medical History:  Past Medical History:  Diagnosis Date  . Anemia   . Anemia   . Diabetes mellitus without complication (HCC)    prediabetic  . GERD (gastroesophageal reflux disease)   . melanoma with met dz dx'd 01/2018   brain and adrenal  . Seasonal allergies     Past Surgical History:  Procedure Laterality Date  . BIOPSY  01/28/2018   Procedure: BIOPSY;  Surgeon: Laurence Spates, MD;  Location: [facility];  Service: Endoscopy;;  . BREAST BIOPSY Left   . ESOPHAGOGASTRODUODENOSCOPY N/A 01/28/2018   Procedure: ESOPHAGOGASTRODUODENOSCOPY (EGD);  Surgeon: Laurence Spates, MD;  Location: [facility];  Service: Endoscopy;  Laterality: N/A;  . MOUTH SURGERY    . OVARY SURGERY     removed "something"   Family History:  Family History  Problem Relation Age of Onset  . Hypertension Father   . Breast cancer Maternal Aunt   . Breast cancer Paternal Aunt   . Diabetes Paternal Aunt   . Breast cancer Maternal Grandmother   . Breast cancer Paternal Grandmother   . Diabetes Paternal Grandmother   . Melanoma Mother   . Colon cancer Neg Hx   . Esophageal cancer Neg Hx   . Pancreatic cancer Neg Hx   . Stomach cancer Neg Hx   . Liver disease Neg Hx    Family Psychiatric  History: not noted Social History:  Social History   Substance and Sexual Activity  Alcohol Use No     Social History   Substance and Sexual Activity  Drug Use No    Social History   Socioeconomic History  .  Marital status: Single    Spouse name: Not on file  . Number of children: Not on file  . Years of education: Not on file  . Highest education level: Not on file  Occupational History  . Not on file  Tobacco Use  . Smoking status: Never Smoker  . Smokeless tobacco: Never Used  Vaping Use  . Vaping Use: Never used  Substance and Sexual Activity  . Alcohol use: No  . Drug use: No  . Sexual activity: Not Currently  Other Topics Concern  . Not on file  Social History Narrative  .  Not on file   Social Determinants of Health   Financial Resource Strain: Not on file  Food Insecurity: Not on file  Transportation Needs: Not on file  Physical Activity: Not on file  Stress: Not on file  Social Connections: Not on file   Additional Social History:   Allergies:  No Known Allergies  Labs:  Results for orders placed or performed during the hospital encounter of 10/10/20 (from the past 48 hour(s))  Resp Panel by RT-PCR (Flu A&B, Covid) Nasopharyngeal Swab     Status: None   Collection Time: 10/10/20  4:47 PM   Specimen: Nasopharyngeal Swab; Nasopharyngeal(NP) swabs in vial transport medium  Result Value Ref Range   SARS Coronavirus 2 by RT PCR NEGATIVE NEGATIVE    Comment: (NOTE) SARS-CoV-2 target nucleic acids are NOT DETECTED.  The SARS-CoV-2 RNA is generally detectable in upper respiratory specimens during the acute phase of infection. The lowest concentration of SARS-CoV-2 viral copies this assay can detect is 138 copies/mL. A negative result does not preclude SARS-Cov-2 infection and should not be used as the sole basis for treatment or other patient management decisions. A negative result may occur with  improper specimen collection/handling, submission of specimen other than nasopharyngeal swab, presence of viral mutation(s) within the areas targeted by this assay, and inadequate number of viral copies(<138 copies/mL). A negative result must be combined with clinical observations, patient history, and epidemiological information. The expected result is Negative.  Fact Sheet for Patients:  EntrepreneurPulse.com.au  Fact Sheet for Healthcare Providers:  IncredibleEmployment.be  This test is no t yet approved or cleared by the [place] FDA and  has been authorized for detection and/or diagnosis of SARS-CoV-2 by FDA under an Emergency Use Authorization (EUA). This EUA will remain  in effect (meaning this test can  be used) for the duration of the COVID-19 declaration under Section 564(b)(1) of the Act, 21 U.S.C.section 360bbb-3(b)(1), unless the authorization is terminated  or revoked sooner.       Influenza A by PCR NEGATIVE NEGATIVE   Influenza B by PCR NEGATIVE NEGATIVE    Comment: (NOTE) The Xpert Xpress SARS-CoV-2/FLU/RSV plus assay is intended as an aid in the diagnosis of influenza from Nasopharyngeal swab specimens and should not be used as a sole basis for treatment. Nasal washings and aspirates are unacceptable for Xpert Xpress SARS-CoV-2/FLU/RSV testing.  Fact Sheet for Patients: EntrepreneurPulse.com.au  Fact Sheet for Healthcare Providers: IncredibleEmployment.be  This test is not yet approved or cleared by the [place] FDA and has been authorized for detection and/or diagnosis of SARS-CoV-2 by FDA under an Emergency Use Authorization (EUA). This EUA will remain in effect (meaning this test can be used) for the duration of the COVID-19 declaration under Section 564(b)(1) of the Act, 21 U.S.C. section 360bbb-3(b)(1), unless the authorization is terminated or revoked.  Performed at [facility], [address]  [place], [place] [address]    Medications:  Current Facility-Administered Medications  Medication Dose Route Frequency Provider Last Rate Last Admin  . acetaminophen (TYLENOL) tablet 650 mg  650 mg Oral Q6H PRN Charlynne Pander, MD   650 mg at 10/12/20 0532  . LORazepam (ATIVAN) tablet 1 mg  1 mg Oral Q4H PRN Charlynne Pander, MD      . multivitamin with minerals tablet 1 tablet  1 tablet Oral Daily Charlynne Pander, MD      . OLANZapine zydis [facility]) disintegrating tablet 5 mg  5 mg Oral QHS Leevy-Johnson, Chantell Kunkler A, NP       Current Outpatient Medications  Medication Sig Dispense Refill  . Acetaminophen 500 MG capsule Take 1,000 mg by mouth every 6 (six) hours as needed for fever or pain.    .  COLLAGEN PO Take 1 tablet by mouth daily.    . Lactobacillus (PROBIOTIC ACIDOPHILUS PO) Take 2 Doses by mouth daily.     Marland Kitchen loratadine (CLARITIN) 10 MG tablet Take 10 mg by mouth daily.    . Milk Thistle 175 MG CAPS Take 175 mg by mouth daily.    . Multiple Vitamin (MULTIVITAMIN WITH MINERALS) TABS tablet Take 1 tablet by mouth daily.    . mupirocin ointment (BACTROBAN) 2 % APPLY INTO THE NOSE AS DIRECTED TWICE DAILY (Patient not taking: Reported on 10/06/2020) 22 g 1  . solifenacin (VESICARE) 5 MG tablet TAKE 1 TABLET BY MOUTH DAILY. (Patient taking differently: Take 5 mg by mouth daily.) 30 tablet 5  . TURMERIC PO Take 1 tablet by mouth daily.     Musculoskeletal: Strength & Muscle Tone: within normal limits Gait & Station: normal Patient leans: N/A  Psychiatric Specialty Exam: Physical Exam Vitals and nursing note reviewed.  Skin:    Coloration: Skin is pale.  Psychiatric:        Attention and Perception: Attention and perception normal.        Mood and Affect: Mood normal. Affect is flat.        Speech: Speech normal.        Behavior: Behavior normal. Behavior is cooperative.        Thought Content: Thought content normal.        Cognition and Memory: She exhibits impaired recent memory.        Judgment: Judgment normal.     Review of Systems  Psychiatric/Behavioral: Positive for decreased concentration.  All other systems reviewed and are negative.   Blood pressure (!) 131/91, pulse 86, temperature 98 F (36.7 C), temperature source Oral, resp. rate 16, SpO2 100 %.There is no height or weight on file to calculate BMI.  General Appearance: Casual  Eye Contact:  Good  Speech:  Clear and Coherent  Volume:  Normal  Mood:  Euthymic  Affect:  Flat  Thought Process:  Goal Directed  Orientation:  Full (Time, Place, and Person)  Thought Content:  Logical  Suicidal Thoughts:  No  Homicidal Thoughts:  No  Memory:  Immediate;   Poor Recent;   Fair  Judgement:  Fair  Insight:   Present and Shallow  Psychomotor Activity:  Normal  Concentration:  Concentration: Fair and Attention Span: Fair  Recall:  [employer] of Knowledge:  Fair  Language:  Fair  Akathisia:  NA  Handed:    AIMS (if indicated):     Assets:  Communication Skills Desire for Improvement Financial Resources/Insurance Physical Health Resilience Social Support Transportation  ADL's:  Intact  Cognition:  WNL  Sleep:      Treatment Plan Summary: Plan Adjust medications: d'c Zyprexa, begin Seroquel and prepare for discharge to family.   Disposition: No evidence of imminent risk to self or others at present.   Patient does not meet criteria for psychiatric inpatient admission. Supportive therapy provided about ongoing stressors. Discussed crisis plan, support from social network, calling 911, coming to the Emergency Department, and calling Suicide Hotline. Patient with advanced cancer and brain mets; psychiatrically cleared for discharge given current medical state.   This service was provided via telemedicine using a 2-way, interactive audio and video technology.  Names of all persons participating in this telemedicine service and their role in this encounter. Name: Oneida Alar Role: PMHNP  Name: Sharma Covert Role: Attending MD  Name: Maahi Lannan. Domingo Cocking Role: patient  Name:  Role:     Inda Merlin, NP 10/12/2020 4:12 PM

## 2020-10-12 NOTE — Progress Notes (Signed)
CSW spoke with patient's step mom Diane at [phone] - Diane agreeable for patient to return with family to [place] once oncology has conversation regarding transfer of care.  Madilyn Fireman, MSW, LCSW Transitions of Care  Clinical Social Worker II [phone]

## 2020-10-12 NOTE — ED Notes (Signed)
Patient asked RN again about the medication Zyprexa, that she did not want last night. She was wanting another explanation about what that medication does. RN explained again. Patient is more calm, cooperative at this moment.

## 2020-10-12 NOTE — Progress Notes (Signed)
Kaitlyn Duncan   DOB:1971-03-29   OI#:[id number]   [phone]  Hem/Onc follow up note   Subjective: I was called by ED physician to see her due to her history of metastatic melanoma.  She is known to our service, has been under my partner Dr. Hazeline Junker care.  She was last seen by Dr. Alen Blew on July 22, 2020.  Her metastatic melanoma was diagnosed in July 2029, with metastasis to brain, peritoneal and subcutaneous nodules.  She was treated with brain radiation, targeted therapy, and immunotherapy Keytruda until 01/2019.  She had a near complete response, and has been on surveillance since then.  Her last staging CT scan from January 2022 showed no evidence of disease progression.  Patient was brought to the emergency room by EMS yesterday for acute mental status change.  Brain CT and MRI showed stable treated brain mets, no edema or new lesions.  She was seen by psychiatry service in the ED for her psychosis.  Her parent came from [place], I met her father in the ED.  They plan to bring her back to [place], and would like to speak to oncology service about her care.  Patient is quite calm in ED, with normal speech, and answer questions appropriately.  Objective:  Vitals:   10/12/20 0200 10/12/20 0940  BP: (!) 125/97 (!) 131/91  Pulse: 71 86  Resp: 16 16  Temp: 97.9 F (36.6 C) 98 F (36.7 C)  SpO2: 98% 100%    There is no height or weight on file to calculate BMI.  Intake/Output Summary (Last 24 hours) at 10/12/2020 1456 Last data filed at 10/11/2020 1748 Gross per 24 hour  Intake 240 ml  Output --  Net 240 ml     Sclerae unicteric  Oropharynx clear  MSK no focal spinal tenderness, no peripheral edema  Neuro nonfocal    CBG (last 3)  No results for input(s): GLUCAP in the last 72 hours.   Urine Studies No results for input(s): UHGB, CRYS in the last 72 hours.  Invalid input(s): UACOL, UAPR, USPG, UPH, UTP, UGL, UKET, UBIL, UNIT, UROB, ULEU, UEPI, UWBC, URBC, UBAC,  CAST, UCOM, BILUA  Basic Metabolic Panel: Recent Labs  Lab 10/10/20 0916  NA 141  K 3.7  CL 105  CO2 19*  GLUCOSE 120*  BUN 34*  CREATININE 0.84  CALCIUM 9.5   GFR CrCl cannot be calculated (Unknown ideal weight.). Liver Function Tests: No results for input(s): AST, ALT, ALKPHOS, BILITOT, PROT, ALBUMIN in the last 168 hours. No results for input(s): LIPASE, AMYLASE in the last 168 hours. Recent Labs  Lab 10/10/20 0917  AMMONIA 47*   Coagulation profile No results for input(s): INR, PROTIME in the last 168 hours.  CBC: Recent Labs  Lab 10/10/20 0916  WBC 8.5  NEUTROABS 6.7  HGB 13.3  HCT 42.5  MCV 89.3  PLT 89*   Cardiac Enzymes: No results for input(s): CKTOTAL, CKMB, CKMBINDEX, TROPONINI in the last 168 hours. BNP: Invalid input(s): POCBNP CBG: No results for input(s): GLUCAP in the last 168 hours. D-Dimer No results for input(s): DDIMER in the last 72 hours. Hgb A1c No results for input(s): HGBA1C in the last 72 hours. Lipid Profile No results for input(s): CHOL, HDL, LDLCALC, TRIG, CHOLHDL, LDLDIRECT in the last 72 hours. Thyroid function studies No results for input(s): TSH, T4TOTAL, T3FREE, THYROIDAB in the last 72 hours.  Invalid input(s): FREET3 Anemia work up No results for input(s): VITAMINB12, FOLATE, FERRITIN, TIBC, IRON, RETICCTPCT in  the last 72 hours. Microbiology Recent Results (from the past 240 hour(s))  Resp Panel by RT-PCR (Flu A&B, Covid) Nasopharyngeal Swab     Status: None   Collection Time: 10/05/20  8:22 PM   Specimen: Nasopharyngeal Swab; Nasopharyngeal(NP) swabs in vial transport medium  Result Value Ref Range Status   SARS Coronavirus 2 by RT PCR NEGATIVE NEGATIVE Final    Comment: (NOTE) SARS-CoV-2 target nucleic acids are NOT DETECTED.  The SARS-CoV-2 RNA is generally detectable in upper respiratory specimens during the acute phase of infection. The lowest concentration of SARS-CoV-2 viral copies this assay can detect  is 138 copies/mL. A negative result does not preclude SARS-Cov-2 infection and should not be used as the sole basis for treatment or other patient management decisions. A negative result may occur with  improper specimen collection/handling, submission of specimen other than nasopharyngeal swab, presence of viral mutation(s) within the areas targeted by this assay, and inadequate number of viral copies(<138 copies/mL). A negative result must be combined with clinical observations, patient history, and epidemiological information. The expected result is Negative.  Fact Sheet for Patients:  EntrepreneurPulse.com.au  Fact Sheet for Healthcare Providers:  IncredibleEmployment.be  This test is no t yet approved or cleared by the [place] FDA and  has been authorized for detection and/or diagnosis of SARS-CoV-2 by FDA under an Emergency Use Authorization (EUA). This EUA will remain  in effect (meaning this test can be used) for the duration of the COVID-19 declaration under Section 564(b)(1) of the Act, 21 U.S.C.section 360bbb-3(b)(1), unless the authorization is terminated  or revoked sooner.       Influenza A by PCR NEGATIVE NEGATIVE Final   Influenza B by PCR NEGATIVE NEGATIVE Final    Comment: (NOTE) The Xpert Xpress SARS-CoV-2/FLU/RSV plus assay is intended as an aid in the diagnosis of influenza from Nasopharyngeal swab specimens and should not be used as a sole basis for treatment. Nasal washings and aspirates are unacceptable for Xpert Xpress SARS-CoV-2/FLU/RSV testing.  Fact Sheet for Patients: EntrepreneurPulse.com.au  Fact Sheet for Healthcare Providers: IncredibleEmployment.be  This test is not yet approved or cleared by the [place] FDA and has been authorized for detection and/or diagnosis of SARS-CoV-2 by FDA under an Emergency Use Authorization (EUA). This EUA will remain in effect  (meaning this test can be used) for the duration of the COVID-19 declaration under Section 564(b)(1) of the Act, 21 U.S.C. section 360bbb-3(b)(1), unless the authorization is terminated or revoked.  Performed at [facility], [place] [address], [place], [place] [address]   Resp Panel by RT-PCR (Flu A&B, Covid) Nasopharyngeal Swab     Status: None   Collection Time: 10/10/20  4:47 PM   Specimen: Nasopharyngeal Swab; Nasopharyngeal(NP) swabs in vial transport medium  Result Value Ref Range Status   SARS Coronavirus 2 by RT PCR NEGATIVE NEGATIVE Final    Comment: (NOTE) SARS-CoV-2 target nucleic acids are NOT DETECTED.  The SARS-CoV-2 RNA is generally detectable in upper respiratory specimens during the acute phase of infection. The lowest concentration of SARS-CoV-2 viral copies this assay can detect is 138 copies/mL. A negative result does not preclude SARS-Cov-2 infection and should not be used as the sole basis for treatment or other patient management decisions. A negative result may occur with  improper specimen collection/handling, submission of specimen other than nasopharyngeal swab, presence of viral mutation(s) within the areas targeted by this assay, and inadequate number of viral copies(<138 copies/mL). A negative result must be combined with  clinical observations, patient history, and epidemiological information. The expected result is Negative.  Fact Sheet for Patients:  EntrepreneurPulse.com.au  Fact Sheet for Healthcare Providers:  IncredibleEmployment.be  This test is no t yet approved or cleared by the [place] FDA and  has been authorized for detection and/or diagnosis of SARS-CoV-2 by FDA under an Emergency Use Authorization (EUA). This EUA will remain  in effect (meaning this test can be used) for the duration of the COVID-19 declaration under Section 564(b)(1) of the Act, 21 U.S.C.section  360bbb-3(b)(1), unless the authorization is terminated  or revoked sooner.       Influenza A by PCR NEGATIVE NEGATIVE Final   Influenza B by PCR NEGATIVE NEGATIVE Final    Comment: (NOTE) The Xpert Xpress SARS-CoV-2/FLU/RSV plus assay is intended as an aid in the diagnosis of influenza from Nasopharyngeal swab specimens and should not be used as a sole basis for treatment. Nasal washings and aspirates are unacceptable for Xpert Xpress SARS-CoV-2/FLU/RSV testing.  Fact Sheet for Patients: EntrepreneurPulse.com.au  Fact Sheet for Healthcare Providers: IncredibleEmployment.be  This test is not yet approved or cleared by the [place] FDA and has been authorized for detection and/or diagnosis of SARS-CoV-2 by FDA under an Emergency Use Authorization (EUA). This EUA will remain in effect (meaning this test can be used) for the duration of the COVID-19 declaration under Section 564(b)(1) of the Act, 21 U.S.C. section 360bbb-3(b)(1), unless the authorization is terminated or revoked.  Performed at [facility], [place] [address], [place], [place] [address]       Studies:  No results found.  Assessment: 51 y.o. with metastatic melanoma, currently on surveillance, prior history of psychosis, GERD, was brought to emergency room for confusion and psychosis.   1. Metastatic melanoma to brain, peritoneal and subcutaneous nodule, status post targeted therapy and immunotherapy with last treatment in 01/2019, on surveillance evidence of disease progression. 2.  Psychosis, improved  3.  Chronic mild thrombocytopenia  Plan:  -I have reviewed her lab results and recent imaging studies (CT and the brain MRI from 10/05/2020 and last restaging CT chest, abdomen pelvis from January 2022), which showed no evidence of disease progression.  She seems to be still responding to immunotherapy, although she has been off for more than a year -I do  not think her psychosis is related to her underlying metastatic melanoma. -Patient will go back to [place] with her parents, which I do not see any problem from medical oncology standpoint.  I recommend her to see local medical oncologist in the next few months after she moves back to [place].  Patient and her father agree with the plan. -I will inform her primary oncologist Dr. Alen Blew, to help set up her referral.   Truitt Merle, MD 10/12/2020

## 2020-10-13 ENCOUNTER — Encounter (HOSPITAL_COMMUNITY): Payer: Self-pay | Admitting: Emergency Medicine

## 2020-10-13 ENCOUNTER — Emergency Department (HOSPITAL_COMMUNITY)
Admission: EM | Admit: 2020-10-13 | Discharge: 2020-10-20 | Disposition: A | Discharge status: Home / Self Care | Payer: Medicare Other | Attending: Emergency Medicine | Admitting: Emergency Medicine

## 2020-10-13 ENCOUNTER — Other Ambulatory Visit: Payer: Self-pay | Admitting: Oncology

## 2020-10-13 ENCOUNTER — Other Ambulatory Visit: Payer: Self-pay

## 2020-10-13 DIAGNOSIS — F29 Unspecified psychosis not due to a substance or known physiological condition: Secondary | ICD-10-CM | POA: Diagnosis not present

## 2020-10-13 DIAGNOSIS — E119 Type 2 diabetes mellitus without complications: Secondary | ICD-10-CM | POA: Insufficient documentation

## 2020-10-13 DIAGNOSIS — C7931 Secondary malignant neoplasm of brain: Secondary | ICD-10-CM | POA: Diagnosis present

## 2020-10-13 DIAGNOSIS — F4325 Adjustment disorder with mixed disturbance of emotions and conduct: Secondary | ICD-10-CM | POA: Diagnosis present

## 2020-10-13 DIAGNOSIS — Z20822 Contact with and (suspected) exposure to covid-19: Secondary | ICD-10-CM | POA: Diagnosis not present

## 2020-10-13 DIAGNOSIS — Z79899 Other long term (current) drug therapy: Secondary | ICD-10-CM | POA: Diagnosis not present

## 2020-10-13 DIAGNOSIS — Z85828 Personal history of other malignant neoplasm of skin: Secondary | ICD-10-CM | POA: Insufficient documentation

## 2020-10-13 DIAGNOSIS — C439 Malignant melanoma of skin, unspecified: Secondary | ICD-10-CM | POA: Diagnosis present

## 2020-10-13 DIAGNOSIS — Z7189 Other specified counseling: Secondary | ICD-10-CM

## 2020-10-13 DIAGNOSIS — R000 Tachycardia, unspecified: Secondary | ICD-10-CM | POA: Diagnosis not present

## 2020-10-13 LAB — CBC
HCT: 38.4 % (ref 36.0–46.0)
Hemoglobin: 12.7 g/dL (ref 12.0–15.0)
MCH: 28.8 pg (ref 26.0–34.0)
MCHC: 33.1 g/dL (ref 30.0–36.0)
MCV: 87.1 fL (ref 80.0–100.0)
Platelets: 89 10*3/uL — ABNORMAL LOW (ref 150–400)
RBC: 4.41 MIL/uL (ref 3.87–5.11)
RDW: 12.2 % (ref 11.5–15.5)
WBC: 5.4 10*3/uL (ref 4.0–10.5)
nRBC: 0 % (ref 0.0–0.2)

## 2020-10-13 LAB — BASIC METABOLIC PANEL
Anion gap: 13 (ref 5–15)
BUN: 25 mg/dL — ABNORMAL HIGH (ref 6–20)
CO2: 21 mmol/L — ABNORMAL LOW (ref 22–32)
Calcium: 9.5 mg/dL (ref 8.9–10.3)
Chloride: 104 mmol/L (ref 98–111)
Creatinine, Ser: 0.88 mg/dL (ref 0.44–1.00)
GFR, Estimated: >60 mL/min (ref >60)
Glucose, Bld: 174 mg/dL — ABNORMAL HIGH (ref 70–99)
Potassium: 3.5 mmol/L (ref 3.5–5.1)
Sodium: 138 mmol/L (ref 135–145)

## 2020-10-13 LAB — RAPID URINE DRUG SCREEN, HOSP PERFORMED
Amphetamines: NOT DETECTED
Barbiturates: NOT DETECTED
Benzodiazepines: POSITIVE — AB
Cocaine: NOT DETECTED
Opiates: NOT DETECTED
Tetrahydrocannabinol: NOT DETECTED

## 2020-10-13 LAB — ETHANOL: Alcohol, Ethyl (B): <10 mg/dL (ref <10)

## 2020-10-13 MED ORDER — STERILE WATER FOR INJECTION IJ SOLN
INTRAMUSCULAR | Status: AC
  Administered 2020-10-13: 10 mL
  Filled 2020-10-13: qty 10

## 2020-10-13 MED ORDER — ACETAMINOPHEN 325 MG PO TABS
650.0000 mg | ORAL_TABLET | ORAL | Status: DC | PRN
  Administered 2020-10-14: 650 mg via ORAL
  Filled 2020-10-13 (×2): qty 2

## 2020-10-13 MED ORDER — ONDANSETRON HCL 4 MG PO TABS: 4.0000 mg | ORAL_TABLET | Freq: Three times a day (TID) | ORAL | Status: DC | PRN

## 2020-10-13 MED ORDER — ZIPRASIDONE MESYLATE 20 MG IM SOLR
10.0000 mg | Freq: Once | INTRAMUSCULAR | Status: AC
  Administered 2020-10-13: 10 mg via INTRAMUSCULAR
  Filled 2020-10-13: qty 20

## 2020-10-13 NOTE — ED Notes (Signed)
Pt screaming at staff and attempting to run from department. Pt states that it is disrespectful for staff to check on her, and pt denies her name. Pt states we are only allowed to call her "ma'am". Pt is verbally abusive to staff and attempted to kick police officers when they kept her from running away from the department.

## 2020-10-13 NOTE — Progress Notes (Signed)
Events over the weekend noted.  Patient was evaluated emergency department for episode of psychosis with paranoia and was discharged with her father and step mother.  I reached out via phone to provide any help or input regarding her cancer care and was informed that she is still fairly restless.  The plan for her to be moved to [place] to be close with her family which I concurred at this time.  Her medical record her oncology care can be transferred once she established with a healthcare facility closer to home.  At this time, she is not receiving any active treatment for her cancer but certainly her disease has not been cured and will likely require further treatment in the future.  We will continue to provide any help or assistance from her oncology standpoint.

## 2020-10-13 NOTE — ED Triage Notes (Signed)
Pt arrived under IVC. Pt was IVC'd by her parents. Pt is agitated and screaming at staff. Pt was at [facility] yesterday and was discharged to her parents, who IVC'd her because they were not able to handle her. Pt is being uncooperative.

## 2020-10-13 NOTE — ED Notes (Signed)
Pt attempted to leave the department. MD made aware.

## 2020-10-13 NOTE — ED Notes (Addendum)
Pt purse placed in pt belonging bag and labeled with a patient sticker. Pt's cell phone is with pt's parents.

## 2020-10-13 NOTE — ED Provider Notes (Incomplete Revision)
[facility] DEPT Provider Note   CSN: [id number] Arrival date & time: 10/13/20  1514     History No chief complaint on file.   Kaitlyn Duncan is a 50 y.o. female.  Patient brought in by police.  Patient was just discharged yesterday after clearance here by behavioral health to go with family.  And they were going to transport her back to [place].  Where she has an oncologist.  Patient does have known melanoma.  Did have a history of mets to the brain.  However had treatment.  An MRI and head CT done the beginning of April showed no progression of disease or active disease.  And actually it it was consistent with treated melanoma.  Patient apparently became too much for family to deal with.  [place]  Patient was very aggressive with authorities.  Had to handcuff her to bring her in.  Patient appears to be having a psychosis.  May be some delusions.  Seems to have some auditory hallucinations.  A lot of her formal thought disorder is centered around religious themes.  Patient was IVC by family.  IVC paperwork will be completed here        Past Medical History:  Diagnosis Date  . Anemia   . Anemia   . Diabetes mellitus without complication (HCC)    prediabetic  . GERD (gastroesophageal reflux disease)   . melanoma with met dz dx'd 01/2018   brain and adrenal  . Seasonal allergies     Patient Active Problem List   Diagnosis Date Noted  . 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 Melanoma of skin ([place]) 05/29/2018  . Goals of care, counseling/discussion 05/29/2018  . Brain metastasis ([place]) 01/26/2018    Past Surgical History:  Procedure Laterality Date  . BIOPSY  01/28/2018   Procedure: BIOPSY;  Surgeon: Laurence Spates, MD;  Location: [facility];  Service: Endoscopy;;  . BREAST BIOPSY Left   . ESOPHAGOGASTRODUODENOSCOPY N/A 01/28/2018   Procedure:  ESOPHAGOGASTRODUODENOSCOPY (EGD);  Surgeon: Laurence Spates, MD;  Location: [facility];  Service: Endoscopy;  Laterality: N/A;  . MOUTH SURGERY    . OVARY SURGERY     removed "something"     OB History    Gravida  0   Para  0   Term  0   Preterm  0   AB  0   Living  0     SAB  0   IAB  0   Ectopic  0   Multiple  0   Live Births  0           Family History  Problem Relation Age of Onset  . Hypertension Father   . Breast cancer Maternal Aunt   . Breast cancer Paternal Aunt   . Diabetes Paternal Aunt   . Breast cancer Maternal Grandmother   . Breast cancer Paternal Grandmother   . Diabetes Paternal Grandmother   . Melanoma Mother   . Colon cancer Neg Hx   . Esophageal cancer Neg Hx   . Pancreatic cancer Neg Hx   . Stomach cancer Neg Hx   . Liver disease Neg Hx     Social History   Tobacco Use  . Smoking status: Never Smoker  . Smokeless tobacco: Never Used  Vaping Use  . Vaping Use: Never used  Substance Use Topics  . Alcohol use: No  . Drug use:  No    Home Medications Prior to Admission medications   Medication Sig Start Date End Date Taking? Authorizing Provider  Acetaminophen 500 MG capsule Take 1,000 mg by mouth every 6 (six) hours as needed for fever or pain.    [provider]  COLLAGEN PO Take 1 tablet by mouth daily.    [provider]  Lactobacillus (PROBIOTIC ACIDOPHILUS PO) Take 2 Doses by mouth daily.  10/04/19   [provider]  loratadine (CLARITIN) 10 MG tablet Take 10 mg by mouth daily.    [provider]  Milk Thistle 175 MG CAPS Take 175 mg by mouth daily. 10/04/19   [provider]  Multiple Vitamin (MULTIVITAMIN WITH MINERALS) TABS tablet Take 1 tablet by mouth daily.    [provider]  mupirocin ointment (BACTROBAN) 2 % APPLY INTO THE NOSE AS DIRECTED TWICE DAILY Patient not taking: Reported on 10/06/2020 06/11/20 06/11/21  Wyatt Portela, MD  solifenacin (VESICARE) 5 MG  tablet TAKE 1 TABLET BY MOUTH DAILY. Patient taking differently: Take 5 mg by mouth daily. 01/15/20 01/14/21  Bruning, Ashlyn, PA-C  TURMERIC PO Take 1 tablet by mouth daily.    [provider]    Allergies    Patient has no known allergies.  Review of Systems   Review of Systems  Unable to perform ROS: Psychiatric disorder  Skin: Negative for rash.  Hematological: Does not bruise/bleed easily.  Psychiatric/Behavioral: Positive for agitation and hallucinations. Negative for suicidal ideas. The patient is hyperactive.     Physical Exam Updated Vital Signs BP (!) 137/98   Pulse (!) 108   Temp 98 F (36.7 C) (Oral)   Resp 17   SpO2 98%   Physical Exam Vitals and nursing note reviewed.  Constitutional:      General: She is in acute distress.     Appearance: She is well-developed.  HENT:     Head: Normocephalic and atraumatic.  Eyes:     Extraocular Movements: Extraocular movements intact.     Conjunctiva/sclera: Conjunctivae normal.     Pupils: Pupils are equal, round, and reactive to light.  Cardiovascular:     Rate and Rhythm: Regular rhythm. Tachycardia present.     Heart sounds: No murmur heard.   Pulmonary:     Effort: Pulmonary effort is normal. No respiratory distress.     Breath sounds: Normal breath sounds.  Abdominal:     Palpations: Abdomen is soft.     Tenderness: There is no abdominal tenderness.  Musculoskeletal:        General: No swelling. Normal range of motion.     Cervical back: Neck supple.  Skin:    General: Skin is warm and dry.  Neurological:     General: No focal deficit present.     Mental Status: She is alert. She is disoriented.     Comments: No obvious focal deficit.  Patient moving all 4 extremities.  Patient actually agitated and aggressive.     ED Results / Procedures / Treatments   Labs (all labs ordered are listed, but only abnormal results are displayed) Labs Reviewed  BASIC METABOLIC PANEL - Abnormal; Notable for the  following components:      Result Value   CO2 21 (*)    Glucose, Bld 174 (*)    BUN 25 (*)    All other components within normal limits  RAPID URINE DRUG SCREEN, HOSP PERFORMED - Abnormal; Notable for the following components:   Benzodiazepines POSITIVE (*)  All other components within normal limits  CBC  ETHANOL    EKG None  Radiology No results found.  Procedures Procedures   Medications Ordered in ED Medications  ziprasidone (GEODON) injection 10 mg (10 mg Intramuscular Given 10/13/20 1556)  sterile water (preservative free) injection (10 mLs  Given 10/13/20 1557)    ED Course  I have reviewed the triage vital signs and the nursing notes.  Pertinent labs & imaging results that were available during my care of the patient were reviewed by me and considered in my medical decision making (see chart for details).    MDM Rules/Calculators/A&P                         Patient seems to be actively psychotic.  With delusions and auditory hallucinations.  Patient's IVC paperwork completed.  Patient required 10 mg of Geodon.  Because he was so aggressive with police.  We had to initially put her in restraints.  Geodon now seems to be having some effects.  Patient much more cooperative.  Restraints have been removed.  Patient has screening labs ordered for behavioral health.  And will require behavioral health evaluation.  Screening labs have been ordered.  Are pending. Screening labs without any significant abnormalities.  Patient medically cleared.    Final Clinical Impression(s) / ED Diagnoses Final diagnoses:  Psychosis, unspecified psychosis type [facility])    Rx / DC Orders ED Discharge Orders    None       Fredia Sorrow, MD 10/13/20 1740

## 2020-10-13 NOTE — Progress Notes (Signed)
.   Transition of Care [facility]) - Emergency Department Mini Assessment   Patient Details  Name: Kaitlyn Duncan MRN: [id number] Date of Birth: [birth year]  Transition of Care [facility]) CM/SW Contact:    Erenest Rasher, RN Phone Number: [phone] 10/13/2020, 10:42 PM   Clinical Narrative: TOC CM spoke to pt at bedside. Pt calm and felAble to explain to pt plan and she was able to speak to [facility] CM about everything she was hoping could happen. She does want to consider going back to [place] with her parents. Pt's father at bedside. Concerns are if pt will be able to tolerate an 11 hour ride back to [place]. Pt can recollect events but is easily agitated when others discuss her medical condition. Pt's father, Maranda Marte and stepmother will be here until Wednesday. 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 States they plan to visit her storage room and make sure all valuables are packed. Plan is to take her back to [place] if she is agreeable to travel and able to tolerate with her increasing aggressive and paranoid behaviors.    ED Mini Assessment: What brought you to the Emergency Department? : paranoid, confusion, aggressive behavior  Barriers to Discharge: ED Psych evaluation  Barrier interventions: placement     Interventions which prevented an admission or readmission: Other (must enter comment),Homeless Screening    Patient Contact and Communications Key Contact 1: Tarri Glenn     Contact Date: 10/13/20,   Contact time: 1636 Contact Phone Number: [phone]           Admission diagnosis:  IVC Patient Active Problem List   Diagnosis Date Noted  . 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 Melanoma of skin ([place]) 05/29/2018  . Goals of care, counseling/discussion 05/29/2018  . Brain metastasis ([place]) 01/26/2018   PCP:  [name], NP Pharmacy:   [facility] [place] [address] Phone: [phone] Fax: [phone]  [place], [address] [place] [address] Phone: [phone] Fax: [phone]
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
## 2020-10-13 NOTE — ED Notes (Signed)
Pt removed from violent restraints

## 2020-10-13 NOTE — ED Provider Notes (Addendum)
[facility] DEPT Provider Note   CSN: [id number] Arrival date & time: 10/13/20  1514     History No chief complaint on file.   Kaitlyn Duncan is a 50 y.o. female.  Patient brought in by police.  Patient was just discharged yesterday after clearance here by behavioral health to go with family.  And they were going to transport her back to [place].  Where she has an oncologist.  Patient does have known melanoma.  Did have a history of mets to the brain.  However had treatment.  An MRI and head CT done the beginning of April showed no progression of disease or active disease.  And actually it it was consistent with treated melanoma.  Patient apparently became too much for family to deal with.  [place]  Patient was very aggressive with authorities.  Had to handcuff her to bring her in.  Patient appears to be having a psychosis.  May be some delusions.  Seems to have some auditory hallucinations.  A lot of her formal thought disorder is centered around religious themes.  Patient was IVC by family.  IVC paperwork will be completed here        Past Medical History:  Diagnosis Date  . Anemia   . Anemia   . Diabetes mellitus without complication (HCC)    prediabetic  . GERD (gastroesophageal reflux disease)   . melanoma with met dz dx'd 01/2018   brain and adrenal  . Seasonal allergies     Patient Active Problem List   Diagnosis Date Noted  . 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 Melanoma of skin ([place]) 05/29/2018  . Goals of care, counseling/discussion 05/29/2018  . Brain metastasis ([place]) 01/26/2018    Past Surgical History:  Procedure Laterality Date  . BIOPSY  01/28/2018   Procedure: BIOPSY;  Surgeon: Laurence Spates, MD;  Location: [facility];  Service: Endoscopy;;  . BREAST BIOPSY Left   . ESOPHAGOGASTRODUODENOSCOPY N/A 01/28/2018   Procedure:  ESOPHAGOGASTRODUODENOSCOPY (EGD);  Surgeon: Laurence Spates, MD;  Location: [facility];  Service: Endoscopy;  Laterality: N/A;  . MOUTH SURGERY    . OVARY SURGERY     removed "something"     OB History    Gravida  0   Para  0   Term  0   Preterm  0   AB  0   Living  0     SAB  0   IAB  0   Ectopic  0   Multiple  0   Live Births  0           Family History  Problem Relation Age of Onset  . Hypertension Father   . Breast cancer Maternal Aunt   . Breast cancer Paternal Aunt   . Diabetes Paternal Aunt   . Breast cancer Maternal Grandmother   . Breast cancer Paternal Grandmother   . Diabetes Paternal Grandmother   . Melanoma Mother   . Colon cancer Neg Hx   . Esophageal cancer Neg Hx   . Pancreatic cancer Neg Hx   . Stomach cancer Neg Hx   . Liver disease Neg Hx     Social History   Tobacco Use  . Smoking status: Never Smoker  . Smokeless tobacco: Never Used  Vaping Use  . Vaping Use: Never used  Substance Use Topics  . Alcohol use: No  . Drug use:  No    Home Medications Prior to Admission medications   Medication Sig Start Date End Date Taking? Authorizing Provider  Acetaminophen 500 MG capsule Take 1,000 mg by mouth every 6 (six) hours as needed for fever or pain.    [provider]  COLLAGEN PO Take 1 tablet by mouth daily.    [provider]  Lactobacillus (PROBIOTIC ACIDOPHILUS PO) Take 2 Doses by mouth daily.  10/04/19   [provider]  loratadine (CLARITIN) 10 MG tablet Take 10 mg by mouth daily.    [provider]  Milk Thistle 175 MG CAPS Take 175 mg by mouth daily. 10/04/19   [provider]  Multiple Vitamin (MULTIVITAMIN WITH MINERALS) TABS tablet Take 1 tablet by mouth daily.    [provider]  mupirocin ointment (BACTROBAN) 2 % APPLY INTO THE NOSE AS DIRECTED TWICE DAILY Patient not taking: Reported on 10/06/2020 06/11/20 06/11/21  Wyatt Portela, MD  solifenacin (VESICARE) 5 MG  tablet TAKE 1 TABLET BY MOUTH DAILY. Patient taking differently: Take 5 mg by mouth daily. 01/15/20 01/14/21  Bruning, Ashlyn, PA-C  TURMERIC PO Take 1 tablet by mouth daily.    [provider]    Allergies    Patient has no known allergies.  Review of Systems   Review of Systems  Unable to perform ROS: Psychiatric disorder  Skin: Negative for rash.  Hematological: Does not bruise/bleed easily.  Psychiatric/Behavioral: Positive for agitation and hallucinations. Negative for suicidal ideas. The patient is hyperactive.     Physical Exam Updated Vital Signs BP (!) 137/98   Pulse (!) 108   Temp 98 F (36.7 C) (Oral)   Resp 17   SpO2 98%   Physical Exam Vitals and nursing note reviewed.  Constitutional:      General: She is in acute distress.     Appearance: She is well-developed.  HENT:     Head: Normocephalic and atraumatic.  Eyes:     Extraocular Movements: Extraocular movements intact.     Conjunctiva/sclera: Conjunctivae normal.     Pupils: Pupils are equal, round, and reactive to light.  Cardiovascular:     Rate and Rhythm: Regular rhythm. Tachycardia present.     Heart sounds: No murmur heard.   Pulmonary:     Effort: Pulmonary effort is normal. No respiratory distress.     Breath sounds: Normal breath sounds.  Abdominal:     Palpations: Abdomen is soft.     Tenderness: There is no abdominal tenderness.  Musculoskeletal:        General: No swelling. Normal range of motion.     Cervical back: Neck supple.  Skin:    General: Skin is warm and dry.  Neurological:     General: No focal deficit present.     Mental Status: She is alert. She is disoriented.     Comments: No obvious focal deficit.  Patient moving all 4 extremities.  Patient actually agitated and aggressive.     ED Results / Procedures / Treatments   Labs (all labs ordered are listed, but only abnormal results are displayed) Labs Reviewed  BASIC METABOLIC PANEL - Abnormal; Notable for the  following components:      Result Value   CO2 21 (*)    Glucose, Bld 174 (*)    BUN 25 (*)    All other components within normal limits  RAPID URINE DRUG SCREEN, HOSP PERFORMED - Abnormal; Notable for the following components:   Benzodiazepines POSITIVE (*)  All other components within normal limits  CBC  ETHANOL    EKG None  Radiology No results found.  Procedures Procedures   Medications Ordered in ED Medications  ziprasidone (GEODON) injection 10 mg (10 mg Intramuscular Given 10/13/20 1556)  sterile water (preservative free) injection (10 mLs  Given 10/13/20 1557)    ED Course  I have reviewed the triage vital signs and the nursing notes.  Pertinent labs & imaging results that were available during my care of the patient were reviewed by me and considered in my medical decision making (see chart for details).    MDM Rules/Calculators/A&P                         Patient seems to be actively psychotic.  With delusions and auditory hallucinations.  Patient's IVC paperwork completed.  Patient required 10 mg of Geodon.  Because he was so aggressive with police.  We had to initially put her in restraints.  Geodon now seems to be having some effects.  Patient much more cooperative.  Restraints have been removed.  Patient has screening labs ordered for behavioral health.  And will require behavioral health evaluation.  Screening labs have been ordered.  Are pending. Screening labs without any significant abnormalities.  Patient medically cleared.    Final Clinical Impression(s) / ED Diagnoses Final diagnoses:  Psychosis, unspecified psychosis type [facility])    Rx / DC Orders ED Discharge Orders    None       Fredia Sorrow, MD 10/13/20 1740    Fredia Sorrow, MD 10/16/20 [phone]

## 2020-10-13 NOTE — ED Notes (Signed)
Pt is stating to [employer] that "god will drag you down to the pits of hell!" and when the [facility] officer told her that she needed to get back in bed she exclaimed "[name] I need you to take him out now!" Councilor made aware.

## 2020-10-13 NOTE — ED Notes (Signed)
Patient visualized attempting to leave the department, [employer] escorted back to the stretcher.

## 2020-10-14 DIAGNOSIS — F29 Unspecified psychosis not due to a substance or known physiological condition: Secondary | ICD-10-CM | POA: Diagnosis not present

## 2020-10-14 LAB — SARS CORONAVIRUS 2 (TAT 6-24 HRS): SARS Coronavirus 2: NEGATIVE

## 2020-10-14 MED ORDER — ZIPRASIDONE MESYLATE 20 MG IM SOLR
10.0000 mg | Freq: Once | INTRAMUSCULAR | Status: AC
  Administered 2020-10-14: 10 mg via INTRAMUSCULAR
  Filled 2020-10-14: qty 20

## 2020-10-14 MED ORDER — STERILE WATER FOR INJECTION IJ SOLN
INTRAMUSCULAR | Status: AC
  Administered 2020-10-14: 1 mL
  Filled 2020-10-14: qty 10

## 2020-10-14 MED ORDER — ZIPRASIDONE MESYLATE 20 MG IM SOLR
10.0000 mg | Freq: Once | INTRAMUSCULAR | Status: AC
  Administered 2020-10-15: 10 mg via INTRAMUSCULAR
  Filled 2020-10-14: qty 20

## 2020-10-14 NOTE — Consult Note (Signed)
Telepsych Consultation   Reason for Consult:  Reassess Tele consult  Referring Physician:  EDP Jacqualine Mau MD  Location of Patient: [facility] Location of Provider: [facility]  Patient Identification: Kaitlyn Duncan MRN:  [id number] Principal Diagnosis: Adjustment disorder with mixed disturbance of emotions and conduct Diagnosis:  Principal Problem:   Adjustment disorder with mixed disturbance of emotions and conduct   Total Time spent with patient: 20 minutes   Subjective   Kaitlyn Duncan, 50 y.o., female patient seen via tele health by this provider, consulted with Dr. Dwyane Dee and chart reviewed on 10/14/20.  On evaluation Kaitlyn Duncan reports  HPI  During evaluation Kaitlyn Duncan is sitting in bed reading her Bible in no acute distress. She is alert, oriented to person, place, day and situation. She is calm and cooperative.  She reports her mood as "relatively well", denies being depressed, with congruent affect.  She does not appear to be responding to internal/external stimuli or delusional thoughts.  Patient  denies suicidal/self-harm/homicidal ideation, psychosis, and paranoia. Reports she has strong faith and would never hurt herself.   Patient reports she is not very trusting of her father and step mother but states she doesn't feel like they are out to hurt her in any way. States she doesn't trust her father because he lied to her, states  "He told me that he was taking me to [place] but he brought me here" Reports she has a good relationship with her father. Reports she isn't sure she would want to go home with him if she is discharged. States if she doesn't go home with her father she isn't sure where she would go.    Past Psychiatric History reports depression   Risk to Self:  no  Risk to Others:  no Prior Inpatient Therapy:   reports admission when she was young by her father r/t her depression. Prior Outpatient Therapy:   denies  Past Medical History:  Past Medical History:  Diagnosis Date  . Anemia   . Anemia   . Diabetes mellitus without complication (HCC)    prediabetic  . GERD (gastroesophageal reflux disease)   . melanoma with met dz dx'd 01/2018   brain and adrenal  . Seasonal allergies     Past Surgical History:  Procedure Laterality Date  . BIOPSY  01/28/2018   Procedure: BIOPSY;  Surgeon: Laurence Spates, MD;  [facility];  Service: Endoscopy;;  . BREAST BIOPSY Left   . ESOPHAGOGASTRODUODENOSCOPY N/A 01/28/2018   Procedure: ESOPHAGOGASTRODUODENOSCOPY (EGD);  Surgeon: Laurence Spates, MD;  Location: [facility];  Service: Endoscopy;  Laterality: N/A;  . MOUTH SURGERY    . OVARY SURGERY     removed "something"   Family History:  Family History  Problem Relation Age of Onset  . Hypertension Father   . Breast cancer Maternal Aunt   . Breast cancer Paternal Aunt   . Diabetes Paternal Aunt   . Breast cancer Maternal Grandmother   . Breast cancer Paternal Grandmother   . Diabetes Paternal Grandmother   . Melanoma Mother   . Colon cancer Neg Hx   . Esophageal cancer Neg Hx   . Pancreatic cancer Neg Hx   . Stomach cancer Neg Hx   . Liver disease Neg Hx    Family Psychiatric  History: unknown  Social History:  Social History   Substance and Sexual Activity  Alcohol Use No     Social History   Substance and Sexual Activity  Drug Use No    Social History   Socioeconomic History  . Marital status: Single    Spouse name: Not on file  . Number of children: Not on file  . Years of education: Not on file  . Highest education level: Not on file  Occupational History  . Not on file  Tobacco Use  . Smoking status: Never Smoker  . Smokeless tobacco: Never Used  Vaping Use  . Vaping Use: Never used  Substance and Sexual Activity  . Alcohol use: No  . Drug use: No  . Sexual activity: Not Currently  Other Topics Concern  . Not on file  Social History Narrative  .  Not on file   Social Determinants of Health   Financial Resource Strain: Not on file  Food Insecurity: Not on file  Transportation Needs: Not on file  Physical Activity: Not on file  Stress: Not on file  Social Connections: Not on file   Additional Social History:    Allergies:  No Known Allergies  Labs:  Results for orders placed or performed during the hospital encounter of 10/13/20 (from the past 48 hour(s))  CBC     Status: Abnormal   Collection Time: 10/13/20  3:29 PM  Result Value Ref Range   WBC 5.4 4.0 - 10.5 K/uL   RBC 4.41 3.87 - 5.11 MIL/uL   Hemoglobin 12.7 12.0 - 15.0 g/dL   HCT 38.4 36.0 - 46.0 %   MCV 87.1 80.0 - 100.0 fL   MCH 28.8 26.0 - 34.0 pg   MCHC 33.1 30.0 - 36.0 g/dL   RDW 12.2 11.5 - 15.5 %   Platelets 89 (L) 150 - 400 K/uL    Comment: SPECIMEN CHECKED FOR CLOTS Immature Platelet Fraction may be clinically indicated, consider ordering this additional test [id number] REPEATED TO VERIFY PLATELET COUNT CONFIRMED BY SMEAR    nRBC 0.0 0.0 - 0.2 %    Comment: Performed at [facility], [place] [address] [place], [place] [address]  Basic metabolic panel     Status: Abnormal   Collection Time: 10/13/20  3:29 PM  Result Value Ref Range   Sodium 138 135 - 145 mmol/L   Potassium 3.5 3.5 - 5.1 mmol/L   Chloride 104 98 - 111 mmol/L   CO2 21 (L) 22 - 32 mmol/L   Glucose, Bld 174 (H) 70 - 99 mg/dL    Comment: Glucose reference range applies only to samples taken after fasting for at least 8 hours.   BUN 25 (H) 6 - 20 mg/dL   Creatinine, Ser 0.88 0.44 - 1.00 mg/dL   Calcium 9.5 8.9 - 10.3 mg/dL   GFR, Estimated >60 >60 mL/min    Comment: (NOTE) Calculated using the CKD-EPI Creatinine Equation (2021)    Anion gap 13 5 - 15    Comment: Performed at [facility], [place] [address] [place], [place] [address]  Ethanol     Status: None   Collection Time: 10/13/20  3:29 PM  Result Value Ref Range   Alcohol, Ethyl (B) <10  <10 mg/dL    Comment: (NOTE) Lowest detectable limit for serum alcohol is 10 mg/dL.  For medical purposes only. Performed at [facility], [place] [address] [place], [place] [address]   Rapid urine drug screen (hospital performed)     Status: Abnormal   Collection Time: 10/13/20  4:00 PM  Result Value Ref Range   Opiates NONE DETECTED NONE DETECTED  Cocaine NONE DETECTED NONE DETECTED   Benzodiazepines POSITIVE (A) NONE DETECTED   Amphetamines NONE DETECTED NONE DETECTED   Tetrahydrocannabinol NONE DETECTED NONE DETECTED   Barbiturates NONE DETECTED NONE DETECTED    Comment: (NOTE) DRUG SCREEN FOR MEDICAL PURPOSES ONLY.  IF CONFIRMATION IS NEEDED FOR ANY PURPOSE, NOTIFY LAB WITHIN 5 DAYS.  LOWEST DETECTABLE LIMITS FOR URINE DRUG SCREEN Drug Class                     Cutoff (ng/mL) Amphetamine and metabolites    1000 Barbiturate and metabolites    200 Benzodiazepine                 161 Tricyclics and metabolites     300 Opiates and metabolites        300 Cocaine and metabolites        300 THC                            50 Performed at [facility], [place] [address], [place], [place] [address]   SARS CORONAVIRUS 2 (TAT 6-24 HRS) Nasopharyngeal Nasopharyngeal Swab     Status: None   Collection Time: 10/13/20  6:32 PM   Specimen: Nasopharyngeal Swab  Result Value Ref Range   SARS Coronavirus 2 NEGATIVE NEGATIVE    Comment: (NOTE) SARS-CoV-2 target nucleic acids are NOT DETECTED.  The SARS-CoV-2 RNA is generally detectable in upper and lower respiratory specimens during the acute phase of infection. Negative results do not preclude SARS-CoV-2 infection, do not rule out co-infections with other pathogens, and should not be used as the sole basis for treatment or other patient management decisions. Negative results must be combined with clinical observations, patient history, and epidemiological information. The expected result is  Negative.  Fact Sheet for Patients: SugarRoll.be  Fact Sheet for Healthcare Providers: https://www.woods-mathews.com/  This test is not yet approved or cleared by the [place] FDA and  has been authorized for detection and/or diagnosis of SARS-CoV-2 by FDA under an Emergency Use Authorization (EUA). This EUA will remain  in effect (meaning this test can be used) for the duration of the COVID-19 declaration under Se ction 564(b)(1) of the Act, 21 U.S.C. section 360bbb-3(b)(1), unless the authorization is terminated or revoked sooner.  Performed at [facility], [place] [address] [place], [place] [address]     Medications:  Current Facility-Administered Medications  Medication Dose Route Frequency Provider Last Rate Last Admin  . acetaminophen (TYLENOL) tablet 650 mg  650 mg Oral Q4H PRN Fredia Sorrow, MD   650 mg at 10/14/20 1746  . ondansetron (ZOFRAN) tablet 4 mg  4 mg Oral Q8H PRN Fredia Sorrow, MD       Current Outpatient Medications  Medication Sig Dispense Refill  . Acetaminophen 500 MG capsule Take 1,000 mg by mouth every 6 (six) hours as needed for fever or pain.    . COLLAGEN PO Take 1 tablet by mouth daily.    . Lactobacillus (PROBIOTIC ACIDOPHILUS PO) Take 2 Doses by mouth daily.     Marland Kitchen loratadine (CLARITIN) 10 MG tablet Take 10 mg by mouth daily.    . Milk Thistle 175 MG CAPS Take 175 mg by mouth daily.    . Multiple Vitamin (MULTIVITAMIN WITH MINERALS) TABS tablet Take 1 tablet by mouth daily.    . mupirocin ointment (BACTROBAN) 2 % APPLY INTO THE NOSE AS DIRECTED TWICE DAILY 22  g 1  . sodium chloride (NASAL MOIST) 0.65 % nasal spray Place 1 spray into the nose as needed for congestion.    . solifenacin (VESICARE) 5 MG tablet TAKE 1 TABLET BY MOUTH DAILY. (Patient taking differently: Take 5 mg by mouth daily.) 30 tablet 5  . TURMERIC PO Take 1 tablet by mouth daily.      Musculoskeletal: Strength & Muscle  Tone: within normal limits Gait & Station: normal Patient leans: Right and N/A  Psychiatric Specialty Exam: Physical Exam Vitals reviewed.  Constitutional:      Appearance: Normal appearance.  HENT:     Head: Normocephalic.     Nose: Nose normal.     Mouth/Throat:     Mouth: Mucous membranes are dry.  Eyes:     Extraocular Movements: Extraocular movements intact.     Conjunctiva/sclera: Conjunctivae normal.  Cardiovascular:     Rate and Rhythm: Normal rate.  Pulmonary:     Effort: Pulmonary effort is normal. No respiratory distress.  Musculoskeletal:        General: Normal range of motion.     Cervical back: Normal range of motion.  Skin:    General: Skin is dry.  Neurological:     Mental Status: She is alert.  Psychiatric:        Attention and Perception: Attention normal.        Mood and Affect: Mood normal. Mood is not anxious or depressed. Affect is not angry.        Speech: Speech normal.        Behavior: Behavior normal. Behavior is not agitated or aggressive. Behavior is cooperative.        Thought Content: Thought content is not paranoid or delusional. Thought content does not include homicidal or suicidal ideation. Thought content does not include homicidal or suicidal plan.        Cognition and Memory: Cognition normal.        Judgment: Judgment normal.     Review of Systems  Blood pressure 115/82, pulse 84, temperature 99 F (37.2 C), temperature source Oral, resp. rate 16, SpO2 97 %.There is no height or weight on file to calculate BMI.  General Appearance: Fairly Groomed  Eye Contact:  Good  Speech:  Clear and Coherent and Normal Rate  Volume:  Normal  Mood:  Euthymic  Affect:  Appropriate and Congruent  Thought Process:  Goal Directed  Orientation:  Full (Time, Place, and Person)  Thought Content:  Logical  Suicidal Thoughts:  No  Homicidal Thoughts:  No  Memory:  Immediate;   Good Recent;   Good Remote;   Good  Judgement:  Fair  Insight:  Good  and Fair  Psychomotor Activity:  Normal  Concentration:  Concentration: Good and Attention Span: Good  Recall:  Good  Fund of Knowledge:  Good  Language:  Good  Akathisia:  No  Handed:  Right  AIMS (if indicated):     Assets:  Communication Skills Desire for Improvement Housing Resilience Social Support  ADL's:  Impaired  Cognition:  WNL  Sleep:   reports sleep has improved.      Treatment Plan Summary: Continue observation at [facility] and psych will reassess in am.   Disposition: Psych will reevaluate patient in am to assess for psyc clearance.   This service was provided via telemedicine using a 2-way, interactive audio and video technology.  Names of all persons participating in this telemedicine service and their role in this encounter. Name: Kaitlyn Duncan  Domingo Cocking  Role: patient   Name: Thomes Lolling  Role: PMHNP  Name:  Role:  Name:  Role:     Revonda Humphrey, NP 10/14/2020 8:08 PM

## 2020-10-14 NOTE — ED Notes (Signed)
Pt got up to close the curtains after she was told not to because the sitter has to be able to see her at all times and it is rules. she is IVC and SI

## 2020-10-14 NOTE — ED Notes (Signed)
Pt appears to be speaking in tongues. Continues to request staff call ems for father. Redirected multiple times and pt continues to be agitated.

## 2020-10-14 NOTE — ED Provider Notes (Signed)
Emergency Medicine Observation Re-evaluation Note  Kaitlyn Duncan is a 50 y.o. female, seen on rounds today.  Pt initially presented to the ED for complaints of No chief complaint on file. Currently, the patient is awaiting reevaluation by TTS   Physical Exam  BP 100/65 (BP Location: Right Arm)   Pulse 72   Temp 98.5 F (36.9 C) (Oral)   Resp 16   SpO2 98%  Physical Exam General: wdwn Cardiac: normal rate Lungs: normal resp Psych: awake pleasant  ED Course / MDM  EKG:EKG Interpretation  Date/Time:  Monday October 13 2020 18:44:26 EDT Ventricular Rate:  86 PR Interval:  144 QRS Duration: 96 QT Interval:  371 QTC Calculation: 444 R Axis:   96 Text Interpretation: Sinus rhythm Borderline right axis deviation Confirmed by Fredia Sorrow [phone]) on 10/13/2020 6:56:50 PM   I have reviewed the labs performed to date as well as medications administered while in observation.  Recent changes in the last 24 hours include none  Plan  Current plan is for TTS reevaluation Patient is under full IVC at this time.    Fransico Meadow, Hershal Coria 10/14/20 0930    Charlesetta Shanks, MD 10/15/20 1249

## 2020-10-14 NOTE — Progress Notes (Signed)
TOC CM received call from pt concerned about her curtains being open and people looking in on her while she sleeps. Explained she will need to discuss with her RN and the open curtains is for her safety while in ED. Contacted ED RN to make aware. [place], [place] ED TOC CM [phone]

## 2020-10-14 NOTE — ED Notes (Signed)
Patient quieting reading in bed. Patient cooperative at this moment.

## 2020-10-14 NOTE — ED Notes (Signed)
Pt becoming agitated, stating she is 'concerned for my father. I'm sensing an urgency with his medical and mental health and he needs to come here urgently.' Pt asking for staff to call EMS or asking for an officer. Took pt's father's phone number and redirected. Explained staff unable to call emergency without reason.

## 2020-10-14 NOTE — ED Notes (Addendum)
Patient sleeping comfortably. Rise and fall of chest noted.

## 2020-10-15 DIAGNOSIS — F29 Unspecified psychosis not due to a substance or known physiological condition: Secondary | ICD-10-CM | POA: Diagnosis not present

## 2020-10-15 MED ORDER — ZIPRASIDONE MESYLATE 20 MG IM SOLR
10.0000 mg | Freq: Once | INTRAMUSCULAR | Status: AC
  Administered 2020-10-15: 10 mg via INTRAMUSCULAR
  Filled 2020-10-15: qty 20

## 2020-10-15 MED ORDER — LORAZEPAM 2 MG/ML IJ SOLN
1.0000 mg | Freq: Four times a day (QID) | INTRAMUSCULAR | Status: DC | PRN
  Administered 2020-10-15 – 2020-10-19 (×3): 1 mg via INTRAMUSCULAR
  Filled 2020-10-15 (×3): qty 1

## 2020-10-15 MED ORDER — STERILE WATER FOR INJECTION IJ SOLN
INTRAMUSCULAR | Status: AC
  Administered 2020-10-15: 1.2 mL
  Filled 2020-10-15: qty 10

## 2020-10-15 MED ORDER — STERILE WATER FOR INJECTION IJ SOLN
INTRAMUSCULAR | Status: AC
  Administered 2020-10-15: 10 mL
  Filled 2020-10-15: qty 10
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
## 2020-10-15 NOTE — Care Management (Signed)
Writer referred patient to the following facilities:  [facility] Details Fax  [address] [place] [address]  Internal comment [facility] Details Fax  [address] [place], [place] [address]  Internal comment [facility] Details Fax  [address], [place] [address]  Internal comment [facility] Details Fax  [place] [address], [place] [address]  Internal comment [facility] Details Fax  [address] [place] [address]  Internal comment [facility] Details Fax  [address] [place] [address]  Internal comment [facility] Details Fax  [address] [place] [address]  Internal comment [facility] Details Fax  [place] [place] [address]  Internal comment [facility] Details Fax  [address] [place] [address]  Internal comment [facility] Details Fax  [place], [place] [address]  Internal comment [facility] Details Fax  [address] [place] [address]  Internal comment
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
## 2020-10-15 NOTE — Progress Notes (Signed)
TOC CSW spoke with Paislee Szatkowski father [phone].  Denyse Amass was inquiring about someone contacting him to update him on pts status.  Denyse Amass also inquired about POA.   CSW provided Reagan with the Chaplain's information to continue this conversation.    Ardon Franklin Tarpley-Carter, MSW, LCSW-A Pronouns:  She, Her, Hers                  [place] ED Transitions of CareClinical Social Worker [email or web address] [phone]

## 2020-10-15 NOTE — BH Assessment (Addendum)
Disposition: Followed up with night AC Maudie Mercury, RN), regarding [facility] availability via secure chat. No beds at this time. However, AC will review for bed availability 10/16/2020.   Disposition Update: Received an update from [facility] Maudie Mercury, RN), no beds are available for this patient. Patient has been referred out to multiple hospitals. Will continue to seek outside placement.

## 2020-10-15 NOTE — BH Assessment (Addendum)
Disposition: Thomes Lolling, NP/Dr. Mallie Darting recommends inpatient psychiatric treatment. Per [place] Ria Comment, NP) no [facility] beds available. Per advisement of [facility] patient to be faxed out to facilities for placement. Disposition Counselor (Ava), patient's nurse (Beth, RN) and Rickard Rhymes Marzetta Board) notified of patient's disposition.

## 2020-10-15 NOTE — ED Notes (Signed)
Pt alert this shift. Pt cooperative, no s/s of distress.  Disorganized. Redirectable . Pt eating and drinking.
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
## 2020-10-16 DIAGNOSIS — F29 Unspecified psychosis not due to a substance or known physiological condition: Secondary | ICD-10-CM | POA: Diagnosis not present

## 2020-10-16 MED ORDER — ZIPRASIDONE MESYLATE 20 MG IM SOLR: 10.0000 mg | Freq: Three times a day (TID) | INTRAMUSCULAR | Status: DC | PRN

## 2020-10-16 MED ORDER — QUETIAPINE FUMARATE 50 MG PO TABS
50.0000 mg | ORAL_TABLET | Freq: Every day | ORAL | Status: DC
  Filled 2020-10-16: qty 1

## 2020-10-16 MED ORDER — QUETIAPINE FUMARATE 50 MG PO TABS
150.0000 mg | ORAL_TABLET | Freq: Every day | ORAL | Status: DC
  Filled 2020-10-16: qty 1

## 2020-10-16 MED ORDER — CARBAMAZEPINE 100 MG PO CHEW
200.0000 mg | CHEWABLE_TABLET | Freq: Two times a day (BID) | ORAL | Status: DC
  Administered 2020-10-18 – 2020-10-20 (×4): 200 mg via ORAL
  Filled 2020-10-16 (×10): qty 2

## 2020-10-16 NOTE — Progress Notes (Signed)
Patient ID: Kaitlyn Duncan, female   DOB: July 09, 1970, 50 y.o.   MRN: [id number]   Father called and asked for a placement update for [facility]. Returned call. Spoke with Kaitlyn Duncan's Step mother. Informed her placement is in progress, no confirmed placement at this time.
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
## 2020-10-16 NOTE — Progress Notes (Signed)
Chaplain engaged in a phone visit with Kaitlyn Duncan's father, Mr. Bains.  He had questions around obtaining group home/facility information when Kaitlyn Duncan is discharged and being given some timeframe of when she will be discharged.  He also wanted to know about getting clean clothes to [facility] because he has her personal items.  Mr. Hickey is leaving to go back home to [place] tomorrow and wanted to ensure he had all the information he needed before returning.  He also wants to make sure Kaitlyn Duncan has clean clothes and her items if she will be leaving to a facility anytime soon.    Chaplain let him know she would send a message to physician or case manager to contact him.  He was thankful for all the support Kaitlyn Duncan has received.    Chaplain is available to follow-up.    10/16/20 1100  Clinical Encounter Type  Visited With Family  Visit Type Initial;Social support

## 2020-10-16 NOTE — ED Notes (Signed)
Patient was toileted and given something to drink. Patient is calm and cooperative.

## 2020-10-16 NOTE — ED Notes (Signed)
Pt refused medication. She stated that " she does not belong here or needs meds". Attempted to educate her the benefits but firmly refused.

## 2020-10-16 NOTE — BH Assessment (Signed)
Patient was seen for re-assessment:  She denies SI/HI/Psychosis.  However, she is speaking very slow and mechanically.  Her affect is flat and blunted.  She is denying that she has any mental health complications, but her behavior in the ED has been rather bizarre and she has been agitated at times.  TTS continues to recommend inpatient treatment for this patient.

## 2020-10-17 DIAGNOSIS — F29 Unspecified psychosis not due to a substance or known physiological condition: Secondary | ICD-10-CM | POA: Diagnosis not present

## 2020-10-17 MED ORDER — BENZTROPINE MESYLATE 0.5 MG PO TABS
0.5000 mg | ORAL_TABLET | Freq: Two times a day (BID) | ORAL | Status: DC | PRN
  Administered 2020-10-18: 0.5 mg via ORAL
  Filled 2020-10-17: qty 1

## 2020-10-17 MED ORDER — HALOPERIDOL 5 MG PO TABS
5.0000 mg | ORAL_TABLET | Freq: Two times a day (BID) | ORAL | Status: DC
  Administered 2020-10-18 – 2020-10-20 (×4): 5 mg via ORAL
  Filled 2020-10-17 (×5): qty 1

## 2020-10-17 MED ORDER — HALOPERIDOL LACTATE 5 MG/ML IJ SOLN
5.0000 mg | Freq: Two times a day (BID) | INTRAMUSCULAR | Status: DC
  Administered 2020-10-17 – 2020-10-19 (×2): 5 mg via INTRAMUSCULAR
  Filled 2020-10-17 (×2): qty 1

## 2020-10-17 NOTE — Consult Note (Addendum)
Telepsych Consultation   Reason for Consult: psych consult Referring Physician:  Vanetta Mulders, MD Location of Patient: [facility] Location of Provider: Behavioral Health TTS Department  Patient Identification: Kaitlyn Duncan MRN:  [id number] Principal Diagnosis: Adjustment disorder with mixed disturbance of emotions and conduct Diagnosis:  Principal Problem:   Adjustment disorder with mixed disturbance of emotions and conduct Active Problems:   Brain metastasis (HCC)   Melanoma of skin (HCC)   Goals of care, counseling/discussion  Total Time spent with patient: 20 minutes  Subjective:   Kaitlyn Duncan is a 50 y.o. female patient admitted via IVC.  Patient presents lying in bed. "Because it's not medication that was on my previous list and now they are forcing it on me and I don't think that's right. I am a faith based believer and I am a child of [name], I have the right to refuse chemically based and shot-based medications and vaccinations".  Patient denies any suicidal or homicidal ideations, auditory or visual hallucinations and does not appear to be responding to any external/internal stimuli. Patient presents tangential and illogical with hyper-religiousness. Patient continues to refuse scheduled psychotropic medications and lacks any insight into current medical or psychiatric state.  Patient reviewed with Attending MD Jola Babinski; medications adjusted from PO to IM Haldol 5mg  BID and Benztropine 0.5mg  prn tremor. Continued recommendation for inpatient; [facility], RN notified and reviewing.   HPI:   Kaitlyn Duncan is a 50 year old female patient who presented to [facility] via IVC with police hours after discharged to family for return to [place] where it was reported patient became agitated and aggressive. Patient has recent medical history of advanced melanoma with brain mets; recent MRI, CT show no active progression of disease.   Past Psychiatric History:    -Adjustment Disorder with mixed disturbance of emotions   and conduct  Risk to Self:  yes Risk to Others:  yes Prior Inpatient Therapy:  not noted Prior Outpatient Therapy:  not noted  Past Medical History:  Past Medical History:  Diagnosis Date  . Anemia   . Anemia   . Diabetes mellitus without complication (HCC)    prediabetic  . GERD (gastroesophageal reflux disease)   . melanoma with met dz dx'd 01/2018   brain and adrenal  . Seasonal allergies     Past Surgical History:  Procedure Laterality Date  . BIOPSY  01/28/2018   Procedure: BIOPSY;  Surgeon: 01/30/2018, MD;  Location: [facility];  Service: Endoscopy;;  . BREAST BIOPSY Left   . ESOPHAGOGASTRODUODENOSCOPY N/A 01/28/2018   Procedure: ESOPHAGOGASTRODUODENOSCOPY (EGD);  Surgeon: 01/30/2018, MD;  Location: [facility];  Service: Endoscopy;  Laterality: N/A;  . MOUTH SURGERY    . OVARY SURGERY     removed "something"   Family History:  Family History  Problem Relation Age of Onset  . Hypertension Father   . Breast cancer Maternal Aunt   . Breast cancer Paternal Aunt   . Diabetes Paternal Aunt   . Breast cancer Maternal Grandmother   . Breast cancer Paternal Grandmother   . Diabetes Paternal Grandmother   . Melanoma Mother   . Colon cancer Neg Hx   . Esophageal cancer Neg Hx   . Pancreatic cancer Neg Hx   . Stomach cancer Neg Hx   . Liver disease Neg Hx    Family Psychiatric  History: not noted Social History:  Social History   Substance and Sexual Activity  Alcohol Use No  Social History   Substance and Sexual Activity  Drug Use No    Social History   Socioeconomic History  . Marital status: Single    Spouse name: Not on file  . Number of children: Not on file  . Years of education: Not on file  . Highest education level: Not on file  Occupational History  . Not on file  Tobacco Use  . Smoking status: Never Smoker  . Smokeless tobacco: Never Used  Vaping Use  . Vaping Use:  Never used  Substance and Sexual Activity  . Alcohol use: No  . Drug use: No  . Sexual activity: Not Currently  Other Topics Concern  . Not on file  Social History Narrative  . Not on file   Social Determinants of Health   Financial Resource Strain: Not on file  Food Insecurity: Not on file  Transportation Needs: Not on file  Physical Activity: Not on file  Stress: Not on file  Social Connections: Not on file   Additional Social History:   Allergies:  No Known Allergies  Labs: No results found for this or any previous visit (from the past 48 hour(s)).  Medications:  Current Facility-Administered Medications  Medication Dose Route Frequency Provider Last Rate Last Admin  . acetaminophen (TYLENOL) tablet 650 mg  650 mg Oral Q4H PRN Fredia Sorrow, MD   650 mg at 10/14/20 1746  . benztropine (COGENTIN) tablet 0.5 mg  0.5 mg Oral BID PRN Leevy-Johnson, Icey Tello A, NP      . carbamazepine (TEGRETOL) chewable tablet 200 mg  200 mg Oral BID Thomes Lolling H, NP      . haloperidol (HALDOL) tablet 5 mg  5 mg Oral BID Leevy-Johnson, Shandy Vi A, NP       Or  . haloperidol lactate (HALDOL) injection 5 mg  5 mg Intramuscular BID Leevy-Johnson, Tanesha Arambula A, NP   5 mg at 10/17/20 1529  . LORazepam (ATIVAN) injection 1 mg  1 mg Intramuscular Q6H PRN Ripley Fraise, MD   1 mg at 10/16/20 2052  . ondansetron (ZOFRAN) tablet 4 mg  4 mg Oral Q8H PRN Fredia Sorrow, MD      . ziprasidone (GEODON) injection 10 mg  10 mg Intramuscular Q8H PRN Revonda Humphrey, NP       Current Outpatient Medications  Medication Sig Dispense Refill  . Acetaminophen 500 MG capsule Take 1,000 mg by mouth every 6 (six) hours as needed for fever or pain.    . COLLAGEN PO Take 1 tablet by mouth daily.    . Lactobacillus (PROBIOTIC ACIDOPHILUS PO) Take 2 Doses by mouth daily.     Marland Kitchen loratadine (CLARITIN) 10 MG tablet Take 10 mg by mouth daily.    . Milk Thistle 175 MG CAPS Take 175 mg by mouth daily.    . Multiple  Vitamin (MULTIVITAMIN WITH MINERALS) TABS tablet Take 1 tablet by mouth daily.    . mupirocin ointment (BACTROBAN) 2 % APPLY INTO THE NOSE AS DIRECTED TWICE DAILY 22 g 1  . sodium chloride (NASAL MOIST) 0.65 % nasal spray Place 1 spray into the nose as needed for congestion.    . solifenacin (VESICARE) 5 MG tablet TAKE 1 TABLET BY MOUTH DAILY. (Patient taking differently: Take 5 mg by mouth daily.) 30 tablet 5  . TURMERIC PO Take 1 tablet by mouth daily.     Musculoskeletal: Strength & Muscle Tone: decreased Gait & Station: normal Patient leans: N/A  Psychiatric Specialty Exam: Physical Exam Vitals  and nursing note reviewed.  Psychiatric:        Attention and Perception: She is inattentive.        Mood and Affect: Affect is labile and inappropriate.        Speech: Speech is tangential.        Behavior: Behavior is agitated and withdrawn.        Thought Content: Thought content is delusional.        Cognition and Memory: Cognition is impaired.        Judgment: Judgment is impulsive and inappropriate.     Review of Systems  Psychiatric/Behavioral: Positive for agitation and dysphoric mood.  All other systems reviewed and are negative.   Blood pressure (!) 131/92, pulse 96, temperature 98.4 F (36.9 C), temperature source Oral, resp. rate 20, SpO2 98 %.There is no height or weight on file to calculate BMI.  General Appearance: Bizarre and Casual  Eye Contact:  inconsistent  Speech:  pressured and robotic  Volume:  Normal  Mood:  labile  Affect:  Non-Congruent and Labile  Thought Process:  Disorganized and Irrelevant  Orientation:  Other:  person  Thought Content:  Illogical, Delusions, Paranoid Ideation, Rumination and Tangential  Suicidal Thoughts:  No  Homicidal Thoughts:  No  Memory:  Immediate;   Fair Recent;   Poor  Judgement:  Impaired  Insight:  Lacking and Shallow  Psychomotor Activity:  Normal  Concentration:  Concentration: Fair and Attention Span: Fair  Recall:   [employer] of Knowledge:  Fair  Language:  pressured  Akathisia:  NA  Handed:    AIMS (if indicated):     Assets:  Catering manager Housing Resilience Social Support Vocational/Educational  ADL's:  Intact  Cognition:  WNL  Sleep:      Treatment Plan Summary: Daily contact with patient to assess and evaluate symptoms and progress in treatment, Medication management and Plan adjust medications and admit to inpatient unit for further observation, stabilization, and treatment.   Disposition: Recommend psychiatric Inpatient admission when medically cleared. Supportive therapy provided about ongoing stressors. Discussed crisis plan, support from social network, calling 911, coming to the Emergency Department, and calling Suicide Hotline.  This service was provided via telemedicine using a 2-way, interactive audio and video technology.  Names of all persons participating in this telemedicine service and their role in this encounter. Name: Oneida Alar Role: PMHNP  Name: Sharma Covert Role: Attending MD  Name: Kaitlyn Duncan. Domingo Cocking Role: patient  Name:  Role:     Inda Merlin, NP 10/17/2020 4:15 PM

## 2020-10-17 NOTE — BH Assessment (Addendum)
Disposition: Per Leevy-Johnson NP patient contnues to meet inpatient criteria. @ 2106 Notified [facility] AC Mechele Claude, RN) of patient's bed needs. Awaiting updates. Also re-faxed referrals to multiple facilities for consideration of bed placement.   Patient faxed to the following facilities:  [facility]     [facility]     [facility]     [facility]     [facility]     [facility]     [facility]     [facility]     [facility]     [place]     [place]      EDP (Dr. Melina Copa) and patient's nurse Garen Grams, RN) provided disposition updates.

## 2020-10-17 NOTE — ED Provider Notes (Signed)
Emergency Medicine Observation Re-evaluation Note  Kaitlyn Duncan is a 50 y.o. female, seen on rounds today.  Pt initially presented to the ED for complaints of No chief complaint on file. Currently, the patient is IVC and waiting inpatient placement.  Patient with evidence of psychosis.  Still has a very religious centered delusions.  Patient known to me because I readmitted her.  Patient has a history of metastatic melanoma to the brain.  But no active disease process.  Patient was discharged back to family members.  But then [place]  They are not able to control here.  She was brought back I saw her at that time and she was reIV seed.  They are now looking for placement.  Patient still seems to have a lot of delusions that seem to be religious centered.  Physical Exam  BP (!) 131/92 (BP Location: Right Arm)   Pulse 96   Temp 98.4 F (36.9 C) (Oral)   Resp 20   SpO2 98%  Physical Exam General: Nontoxic no acute distress Cardiac:  Lungs: No acute respiratory Psych: Still seems to be eliciting psychosis and delusions  ED Course / MDM  EKG:EKG Interpretation  Date/Time:  Monday October 13 2020 18:44:26 EDT Ventricular Rate:  86 PR Interval:  144 QRS Duration: 96 QT Interval:  371 QTC Calculation: 444 R Axis:   96 Text Interpretation: Sinus rhythm Borderline right axis deviation Confirmed by Fredia Sorrow [phone]) on 10/13/2020 6:56:50 PM   I have reviewed the labs performed to date as well as medications administered while in observation.  Plan  Current plan is for inpatient place. Patient is under full IVC at this time.   Fredia Sorrow, MD 10/17/20 [phone]

## 2020-10-18 DIAGNOSIS — F29 Unspecified psychosis not due to a substance or known physiological condition: Secondary | ICD-10-CM | POA: Diagnosis not present

## 2020-10-18 MED ORDER — DARIFENACIN HYDROBROMIDE ER 7.5 MG PO TB24
7.5000 mg | ORAL_TABLET | Freq: Every day | ORAL | Status: DC
  Administered 2020-10-18 – 2020-10-20 (×3): 7.5 mg via ORAL
  Filled 2020-10-18 (×3): qty 1

## 2020-10-18 NOTE — BH Assessment (Signed)
This Probation officer met with patient this date to assess current mental health status. Patient appears to be doing better today and is more directable being less agitated. Patient continues to be somewhat  disorganized and is having problems with active recall. Patient per nursing notes has also been more compliant with her medication regimen. Patient does continue to be religiously fixed with evidence of continued delusions. Patient is recommended for a continued inpatient admission to assist with stabilization. Patient is currently under review at several facilities. Status pending.
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
## 2020-10-19 DIAGNOSIS — F29 Unspecified psychosis not due to a substance or known physiological condition: Secondary | ICD-10-CM | POA: Diagnosis not present
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
## 2020-10-19 NOTE — ED Provider Notes (Signed)
Emergency Medicine Observation Re-evaluation Note  Kaitlyn Duncan is a 50 y.o. female, seen on rounds today.  Pt initially presented to the ED for complaints of No chief complaint on file. Currently, the patient is resting comfortably with no complaints awaiting placement.  Physical Exam  BP 127/81 (BP Location: Right Arm)   Pulse 65   Temp 98.2 F (36.8 C) (Oral)   Resp 16   SpO2 97%  Physical Exam General: Resting and sleeping comfortably Lungs: Nonlabored breathing Psych: Cooperative per nursing report  ED Course / MDM  EKG:EKG Interpretation  Date/Time:  Monday October 13 2020 18:44:26 EDT Ventricular Rate:  86 PR Interval:  144 QRS Duration: 96 QT Interval:  371 QTC Calculation: 444 R Axis:   96 Text Interpretation: Sinus rhythm Borderline right axis deviation Confirmed by Fredia Sorrow [phone]) on 10/13/2020 6:56:50 PM   I have reviewed the labs performed to date as well as medications administered while in observation.  Recent changes in the last 24 hours include none.  Plan  Current plan is for inpatient placement. Patient is under full IVC at this time.   Channell Quattrone, Gwenyth Allegra, MD 10/19/20 1459

## 2020-10-19 NOTE — ED Notes (Addendum)
Pt alert this shift. Calm and cooperative. Medication compliant this AM, with encouragement and discussion. Pt denies SI/HI. Pt resting comfortably

## 2020-10-20 ENCOUNTER — Encounter (HOSPITAL_COMMUNITY): Payer: Self-pay | Admitting: Registered Nurse

## 2020-10-20 DIAGNOSIS — F29 Unspecified psychosis not due to a substance or known physiological condition: Secondary | ICD-10-CM | POA: Diagnosis not present

## 2020-10-20 DIAGNOSIS — F4325 Adjustment disorder with mixed disturbance of emotions and conduct: Secondary | ICD-10-CM

## 2020-10-20 NOTE — Discharge Instructions (Signed)
For your behavioral health needs you are advised to follow up with an outpatient psychiatrist.  Contact one of the following providers at your earliest convenience to schedule an intake appointment:       [facility] [address]      [place], [place] [address]      [phone]       [address] [place], [place] [address]      [phone]       [facility]      [address] [place] [address]      [place],  [address]      [phone]

## 2020-10-20 NOTE — Progress Notes (Signed)
TOC CM spoke to pt and states she does not want to go to a shelter. States she has money to pay for a hotel. Pt provided cab voucher to [employer]. [place], [place] ED TOC CM [phone]

## 2020-10-20 NOTE — BH Assessment (Signed)
This Probation officer met with patient this date along with Rankin NP to assess current mental health status. Patient per nurse has been compliant with her medication regimen and is observed to be less agitated as she interacts with this Probation officer. Patient is denying any S/I, H/I or AVH and is requesting to be discharged. Patient does express concerns in reference to her housing once she is discharged. Rankin NP reported patient no longer meets the criteria for a ongoing psychiatric admission as patient will be referred to social work to assist with ongoing needs prior to discharge.

## 2020-10-20 NOTE — BH Assessment (Addendum)
Kaitlyn Duncan Assessment Progress Note  Per Shuvon Rankin, NP, this pt does not require psychiatric hospitalization at this time.  Pt presents under IVC initiated by pt's father and upheld by EDP Fredia Sorrow, MD on 10/13/2020, which expired today (10/20/2020) at 13:25.  Pt is psychiatrically cleared.  Discharge instructions include referral information for several area psychiatry providers for follow up at pt's initiative.  A TOC consult has been ordered to address pt's psychosocial needs.  EDP Milton Ferguson, MD and pt's nurse, Jenny Reichmann, have been notified.  Jalene Mullet, [place] Triage Specialist [phone]

## 2020-10-20 NOTE — Consult Note (Signed)
Telepsych Consultation   Reason for Consult: psych consult Referring Physician:  Fredia Sorrow, MD Location of Patient: [facility] Location of Provider: [place] Department  Patient Identification: Kaitlyn Duncan MRN:  [id number] Principal Diagnosis: Adjustment disorder with mixed disturbance of emotions and conduct Diagnosis:  Principal Problem:   Adjustment disorder with mixed disturbance of emotions and conduct Active Problems:   Brain metastasis ([place])   Melanoma of skin ([place])   Goals of care, counseling/discussion  Total Time spent with patient: 20 minutes  Subjective:   Kaitlyn Duncan is a 50 y.o. female patient admitted via IVC.  Kaitlyn Duncan, 50 y.o., female patient seen via tele health by this provider, consulted with Dr. Ernie Hew; and chart reviewed on 10/20/20.  On evaluation Kaitlyn Duncan reports she is feeling fine and denies suicidal/homicidal ideation, psychosis, and paranoia.  Patient states she was recently discharged and was brought back by her parents when they were leaving to go back to [place].  States that they had no intentions of taking her with them, just brought her back to the hospital.  Patient stats she has been staying in motel and she has some money that she could use for motel.  Patient states she is not interested in staying in a shelter.  "I've stay in a shelter before and I'll never stay in one again."   During evaluation Kaitlyn Duncan is sitting up in bed in no acute distress.  She is alert, oriented x 4.  Patient remained calm and cooperative throughout assessment.  Her mood is irritable with congruent affect.  She does not appear to be responding to internal/external stimuli or delusional thoughts.  Patient denies suicidal/self-harm/homicidal ideation, psychosis, and paranoia.  Patient has been calm with no behavioral outburst.  Social work consult order to assist with resources and palliative  care if needed.     Psychiatric Consult 10/17/20 Patient presents lying in bed. "Because it's not medication that was on my previous list and now they are forcing it on me and I don't think that's right. I am a faith based believer and I am a child of [name], I have the right to refuse chemically based and shot-based medications and vaccinations".  Patient denies any suicidal or homicidal ideations, auditory or visual hallucinations and does not appear to be responding to any external/internal stimuli. Patient presents tangential and illogical with hyper-religiousness. Patient continues to refuse scheduled psychotropic medications and lacks any insight into current medical or psychiatric state.  Patient reviewed with Attending MD Mallie Darting; medications adjusted from PO to IM Haldol 53m BID and Benztropine 0.537mprn tremor. Continued recommendation for inpatient; [facility] notified and reviewing.   HPI:   Kaitlyn Duncan a [age]ear old female patient [facility] after discharged to family for return to [place] where it was reported patient became agitated and aggressive. Patient has recent medical history of advanced melanoma with brain mets; recent MRI, CT show no active progression of disease.   Past Psychiatric History:   -Adjustment Disorder with mixed disturbance of emotions   and conduct  Risk to Self:  yes Risk to Others:  yes Prior Inpatient Therapy:  not noted Prior Outpatient Therapy:  not noted  Past Medical History:  Past Medical History:  Diagnosis Date  . Anemia   . Anemia   . Diabetes mellitus without complication (HCC)    prediabetic  . GERD (gastroesophageal reflux disease)   .  melanoma with met dz dx'd 01/2018   brain and adrenal  . Seasonal allergies     Past Surgical History:  Procedure Laterality Date  . BIOPSY  01/28/2018   Procedure: BIOPSY;  Surgeon: Laurence Spates, MD;  Location: [facility];  Service: Endoscopy;;   . BREAST BIOPSY Left   . ESOPHAGOGASTRODUODENOSCOPY N/A 01/28/2018   Procedure: ESOPHAGOGASTRODUODENOSCOPY (EGD);  Surgeon: Laurence Spates, MD;  Location: [facility];  Service: Endoscopy;  Laterality: N/A;  . MOUTH SURGERY    . OVARY SURGERY     removed "something"   Family History:  Family History  Problem Relation Age of Onset  . Hypertension Father   . Breast cancer Maternal Aunt   . Breast cancer Paternal Aunt   . Diabetes Paternal Aunt   . Breast cancer Maternal Grandmother   . Breast cancer Paternal Grandmother   . Diabetes Paternal Grandmother   . Melanoma Mother   . Colon cancer Neg Hx   . Esophageal cancer Neg Hx   . Pancreatic cancer Neg Hx   . Stomach cancer Neg Hx   . Liver disease Neg Hx    Family Psychiatric  History: not noted Social History:  Social History   Substance and Sexual Activity  Alcohol Use No     Social History   Substance and Sexual Activity  Drug Use No    Social History   Socioeconomic History  . Marital status: Single    Spouse name: Not on file  . Number of children: Not on file  . Years of education: Not on file  . Highest education level: Not on file  Occupational History  . Not on file  Tobacco Use  . Smoking status: Never Smoker  . Smokeless tobacco: Never Used  Vaping Use  . Vaping Use: Never used  Substance and Sexual Activity  . Alcohol use: No  . Drug use: No  . Sexual activity: Not Currently  Other Topics Concern  . Not on file  Social History Narrative  . Not on file   Social Determinants of Health   Financial Resource Strain: Not on file  Food Insecurity: Not on file  Transportation Needs: Not on file  Physical Activity: Not on file  Stress: Not on file  Social Connections: Not on file   Additional Social History:   Allergies:  No Known Allergies  Labs: No results found for this or any previous visit (from the past 48 hour(s)).  Medications:  Current Facility-Administered Medications   Medication Dose Route Frequency Provider Last Rate Last Admin  . acetaminophen (TYLENOL) tablet 650 mg  650 mg Oral Q4H PRN Fredia Sorrow, MD   650 mg at 10/14/20 1746  . benztropine (COGENTIN) tablet 0.5 mg  0.5 mg Oral BID PRN Leevy-Johnson, Brooke A, NP   0.5 mg at 10/18/20 2149  . carbamazepine (TEGRETOL) chewable tablet 200 mg  200 mg Oral BID Revonda Humphrey, NP   200 mg at 10/20/20 1056  . darifenacin (ENABLEX) 24 hr tablet 7.5 mg  7.5 mg Oral Daily Arnaldo Natal, MD   7.5 mg at 10/20/20 1056  . haloperidol (HALDOL) tablet 5 mg  5 mg Oral BID Leevy-Johnson, Brooke A, NP   5 mg at 10/20/20 1056   Or  . haloperidol lactate (HALDOL) injection 5 mg  5 mg Intramuscular BID Leevy-Johnson, Brooke A, NP   5 mg at 10/19/20 2021  . LORazepam (ATIVAN) injection 1 mg  1 mg Intramuscular Q6H PRN Ripley Fraise, MD  1 mg at 10/19/20 2024  . ondansetron (ZOFRAN) tablet 4 mg  4 mg Oral Q8H PRN Fredia Sorrow, MD      . ziprasidone (GEODON) injection 10 mg  10 mg Intramuscular Q8H PRN Revonda Humphrey, NP       Current Outpatient Medications  Medication Sig Dispense Refill  . Acetaminophen 500 MG capsule Take 1,000 mg by mouth every 6 (six) hours as needed for fever or pain.    . COLLAGEN PO Take 1 tablet by mouth daily.    . Lactobacillus (PROBIOTIC ACIDOPHILUS PO) Take 2 Doses by mouth daily.     Marland Kitchen loratadine (CLARITIN) 10 MG tablet Take 10 mg by mouth daily.    . Milk Thistle 175 MG CAPS Take 175 mg by mouth daily.    . Multiple Vitamin (MULTIVITAMIN WITH MINERALS) TABS tablet Take 1 tablet by mouth daily.    . mupirocin ointment (BACTROBAN) 2 % APPLY INTO THE NOSE AS DIRECTED TWICE DAILY 22 g 1  . sodium chloride (NASAL MOIST) 0.65 % nasal spray Place 1 spray into the nose as needed for congestion.    . solifenacin (VESICARE) 5 MG tablet TAKE 1 TABLET BY MOUTH DAILY. (Patient taking differently: Take 5 mg by mouth daily.) 30 tablet 5  . TURMERIC PO Take 1 tablet by mouth daily.      Musculoskeletal: Strength & Muscle Tone: decreased Gait & Station: normal Patient leans: N/A  Psychiatric Specialty Exam: Physical Exam Vitals and nursing note reviewed. Chaperone present: Sitter at bedside.  Constitutional:      General: She is not in acute distress.    Appearance: Normal appearance. She is not ill-appearing.  Cardiovascular:     Rate and Rhythm: Normal rate.  Pulmonary:     Effort: Pulmonary effort is normal.  Musculoskeletal:        General: Normal range of motion.     Cervical back: Normal range of motion.  Neurological:     Mental Status: She is alert and oriented to person, place, and time.  Psychiatric:        Attention and Perception: She is inattentive.        Mood and Affect: Mood is anxious.        Speech: Speech normal.        Behavior: Behavior is cooperative.        Thought Content: Thought content is not paranoid or delusional. Thought content does not include homicidal or suicidal ideation.        Judgment: Judgment is impulsive and inappropriate.     Review of Systems  Constitutional: Negative.   HENT: Negative.   Eyes: Negative.   Respiratory: Negative.   Cardiovascular: Negative.   Gastrointestinal: Negative.   Genitourinary: Negative.   Musculoskeletal: Negative.   Skin: Negative.   Neurological: Negative.   Hematological: Negative.   Psychiatric/Behavioral: Negative for behavioral problems and sleep disturbance. Agitation: Patient irritated with question and with statement of her going [place] to be with her parents.   Hallucinations: Denies. Self-injury: Denies. Suicidal ideas: Denies. Nervous/anxious: Stable.   All other systems reviewed and are negative.   Blood pressure 136/84, pulse 91, temperature 97.8 F (36.6 C), temperature source Oral, resp. rate 18, SpO2 95 %.There is no height or weight on file to calculate BMI.  General Appearance: Casual  Eye Contact:  Good  Speech:  pressured and robotic  Volume:  Normal  Mood:   Irritable  Affect:  Congruent  Thought Process:  Coherent, Linear and  Descriptions of Associations: Intact  Orientation:  Other:  person  Thought Content:  WDL  Suicidal Thoughts:  No  Homicidal Thoughts:  No  Memory:  Immediate;   Fair Recent;   Fair  Judgement:  Fair  Insight:  Fair and Present  Psychomotor Activity:  Normal  Concentration:  Concentration: Fair and Attention Span: Fair  Recall:  [employer] of Knowledge:  Fair  Language:  Good  Akathisia:  NA  Handed:    AIMS (if indicated):     Assets:  Financial Resources/Insurance Housing Resilience Social Support Vocational/Educational  ADL's:  Intact  Cognition:  WNL  Sleep:      Treatment Plan Summary: Plan Psychiatrically cleared.  Social work consult to assist with resources  Disposition:  Psychiatrically cleared No evidence of imminent risk to self or others at present.   Patient does not meet criteria for psychiatric inpatient admission. Supportive therapy provided about ongoing stressors. Refer to IOP. Discussed crisis plan, support from social network, calling 911, coming to the Emergency Department, and calling Suicide Hotline.  This service was provided via telemedicine using a 2-way, interactive audio and video technology.  Names of all persons participating in this telemedicine service and their role in this encounter. Name: Earleen Newport Role  NP  Name: Dr. Ernie Hew Role: Psychiatrist  Name: Kaitlyn Duncan Role: patient  Name: Dr. Zenia Resides /Dr. Roderic Palau Role: [facility] EDP sent secure message informing: Patient has been seen and psychiatrically cleared.  Social work consult ordered to assist with palliative care and resources needed.       , NP 10/20/2020 4:20 PM

## 2020-10-20 NOTE — ED Provider Notes (Signed)
Emergency Medicine Observation Re-evaluation Note  Kaitlyn Duncan is a 50 y.o. female, seen on rounds today.  Pt initially presented to the ED for complaints of No chief complaint on file. Currently, the patient is patient has no new complaints.  Physical Exam  BP 122/67 (BP Location: Right Arm)   Pulse 68   Temp 97.9 F (36.6 C) (Oral)   Resp 16   SpO2 98%  Physical Exam General: Calm and cooperative   ED Course / MDM  EKG:EKG Interpretation  Date/Time:  Monday October 13 2020 18:44:26 EDT Ventricular Rate:  86 PR Interval:  144 QRS Duration: 96 QT Interval:  371 QTC Calculation: 444 R Axis:   96 Text Interpretation: Sinus rhythm Borderline right axis deviation Confirmed by Fredia Sorrow [phone]) on 10/13/2020 6:56:50 PM   I have reviewed the labs performed to date as well as medications administered while in observation.  Recent changes in the last 24 hours include none.  Plan  Current plan is for awaiting inpatient disposition.    Lacretia Leigh, MD 10/20/20 [phone]

## 2020-10-21 ENCOUNTER — Inpatient Hospital Stay: Payer: Medicare Other | Admitting: Oncology

## 2020-10-21 ENCOUNTER — Telehealth: Payer: Self-pay | Admitting: Physician Assistant

## 2020-10-21 ENCOUNTER — Telehealth: Payer: Self-pay | Admitting: *Deleted

## 2020-10-21 ENCOUNTER — Other Ambulatory Visit: Payer: Medicaid Other

## 2020-10-21 ENCOUNTER — Inpatient Hospital Stay: Payer: Medicare Other | Attending: Obstetrics and Gynecology

## 2020-10-21 ENCOUNTER — Ambulatory Visit: Payer: Medicaid Other | Admitting: Oncology

## 2020-10-21 ENCOUNTER — Other Ambulatory Visit: Payer: Self-pay | Admitting: Oncology

## 2020-10-21 DIAGNOSIS — C439 Malignant melanoma of skin, unspecified: Secondary | ICD-10-CM

## 2020-10-21 NOTE — Telephone Encounter (Signed)
FYI

## 2020-10-21 NOTE — Telephone Encounter (Signed)
Noted  

## 2020-10-21 NOTE — Telephone Encounter (Signed)
PC to patient regarding missed appointments today, she states she was instructed by another physician that she didn't need to come today.  Dr. Alen Blew informed.

## 2020-10-21 NOTE — Telephone Encounter (Signed)
Patient called in to cancel procedure for 4/20. Stated her Dr. Lenon Ahmadi it was not needed anymore. I could not make out the Dr. Name she was saying and then she hung up while I was trying to clarify.

## 2020-10-22 ENCOUNTER — Encounter: Payer: Medicare Other | Admitting: Gastroenterology

## 2020-10-24 ENCOUNTER — Telehealth: Payer: Self-pay | Admitting: Oncology

## 2020-10-24 NOTE — Telephone Encounter (Signed)
Scheduled appts per 4/19 sch msg. Called pt, no answer. Left msg with appts dates and times.

## 2020-10-25 ENCOUNTER — Other Ambulatory Visit (HOSPITAL_COMMUNITY): Payer: Self-pay

## 2020-10-25 ENCOUNTER — Encounter (HOSPITAL_COMMUNITY): Payer: Self-pay

## 2020-10-25 ENCOUNTER — Other Ambulatory Visit: Payer: Self-pay

## 2020-10-25 ENCOUNTER — Emergency Department (HOSPITAL_COMMUNITY)
Admission: EM | Admit: 2020-10-25 | Discharge: 2020-10-25 | Disposition: A | Discharge status: Home / Self Care | Payer: Medicare Other | Attending: Emergency Medicine | Admitting: Emergency Medicine

## 2020-10-25 DIAGNOSIS — R443 Hallucinations, unspecified: Secondary | ICD-10-CM | POA: Diagnosis not present

## 2020-10-25 DIAGNOSIS — E119 Type 2 diabetes mellitus without complications: Secondary | ICD-10-CM | POA: Insufficient documentation

## 2020-10-25 DIAGNOSIS — R519 Headache, unspecified: Secondary | ICD-10-CM | POA: Diagnosis present

## 2020-10-25 DIAGNOSIS — R0981 Nasal congestion: Secondary | ICD-10-CM | POA: Diagnosis not present

## 2020-10-25 DIAGNOSIS — Z85828 Personal history of other malignant neoplasm of skin: Secondary | ICD-10-CM | POA: Insufficient documentation

## 2020-10-25 DIAGNOSIS — Z85841 Personal history of malignant neoplasm of brain: Secondary | ICD-10-CM | POA: Insufficient documentation

## 2020-10-25 DIAGNOSIS — R451 Restlessness and agitation: Secondary | ICD-10-CM | POA: Insufficient documentation

## 2020-10-25 MED ORDER — FLUTICASONE PROPIONATE 50 MCG/ACT NA SUSP
2.0000 | Freq: Every day | NASAL | 0 refills | Status: DC
  Filled 2020-10-25: qty 16, 30d supply, fill #0

## 2020-10-25 NOTE — ED Notes (Signed)
Attempted to DC pt at this time.Pt audibly loud and upset and refusing to leave. Pt the starts screaming "BLOODY MURDER, BLOODY MURDER, SOMEBODY CALL 911". Security to bedside at this time to facilitate pts departure. Pt still refusing to leave, security to have [employer] remove pt.

## 2020-10-25 NOTE — ED Triage Notes (Signed)
Pt [facility] ED by EMS from jail with c/o headache, :left armpit pain", "head swimming". And hallucinating. Arrives audibly upset stating she is having an emergency. Vss, NADN.

## 2020-10-25 NOTE — ED Notes (Signed)
Pt. Refused vital signs. RN, Cassell Smiles made aware.

## 2020-10-25 NOTE — ED Provider Notes (Signed)
[facility] DEPT Provider Note   CSN: [id number] Arrival date & time: 10/25/20  0104     History Chief Complaint  Patient presents with  . Headache    Kaitlyn Duncan is a 50 y.o. female.  Patient with past medical history notable for melanoma with metastases to the brain, psychosis, recent prolonged stay at [facility] emergency department for management of psychiatric condition.  She was just discharged about 5 days ago after being cleared by psychiatry.  She had been responding well to treatment here.  Tonight she is brought in by police after reportedly hallucinating and having headache.  She states that her headache has significantly improved.  She states that she believes it was from sinus congestion.  She denies numbness, weakness, or tingling.  She was reportedly very upset when she first arrived, but is now calm and cooperative.  The history is provided by the patient. No language interpreter was used.       Past Medical History:  Diagnosis Date  . Anemia   . Anemia   . Diabetes mellitus without complication (HCC)    prediabetic  . GERD (gastroesophageal reflux disease)   . melanoma with met dz dx'd 01/2018   brain and adrenal  . Seasonal allergies     Patient Active Problem List   Diagnosis Date Noted  . Psychosis ([place])   . 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 Melanoma of skin ([name]) 05/29/2018  . Goals of care, counseling/discussion 05/29/2018  . Brain metastasis ([place]) 01/26/2018    Past Surgical History:  Procedure Laterality Date  . BIOPSY  01/28/2018   Procedure: BIOPSY;  Surgeon: Laurence Spates, MD;  Location: [facility];  Service: Endoscopy;;  . BREAST BIOPSY Left   . ESOPHAGOGASTRODUODENOSCOPY N/A 01/28/2018   Procedure: ESOPHAGOGASTRODUODENOSCOPY (EGD);  Surgeon: Laurence Spates, MD;  Location: [facility];  Service: Endoscopy;  Laterality: N/A;  . MOUTH SURGERY    .  OVARY SURGERY     removed "something"     OB History    Gravida  0   Para  0   Term  0   Preterm  0   AB  0   Living  0     SAB  0   IAB  0   Ectopic  0   Multiple  0   Live Births  0           Family History  Problem Relation Age of Onset  . Hypertension Father   . Breast cancer Maternal Aunt   . Breast cancer Paternal Aunt   . Diabetes Paternal Aunt   . Breast cancer Maternal Grandmother   . Breast cancer Paternal Grandmother   . Diabetes Paternal Grandmother   . Melanoma Mother   . Colon cancer Neg Hx   . Esophageal cancer Neg Hx   . Pancreatic cancer Neg Hx   . Stomach cancer Neg Hx   . Liver disease Neg Hx     Social History   Tobacco Use  . Smoking status: Never Smoker  . Smokeless tobacco: Never Used  Vaping Use  . Vaping Use: Never used  Substance Use Topics  . Alcohol use: No  . Drug use: No    Home Medications Prior to Admission medications   Medication Sig Start Date End Date Taking? Authorizing Provider  Acetaminophen 500 MG capsule Take 1,000 mg by mouth every 6 (six) hours as needed for fever or pain.  [provider]  COLLAGEN PO Take 1 tablet by mouth daily.    [provider]  Lactobacillus (PROBIOTIC ACIDOPHILUS PO) Take 2 Doses by mouth daily.  10/04/19   [provider]  loratadine (CLARITIN) 10 MG tablet Take 10 mg by mouth daily.    [provider]  Milk Thistle 175 MG CAPS Take 175 mg by mouth daily. 10/04/19   [provider]  Multiple Vitamin (MULTIVITAMIN WITH MINERALS) TABS tablet Take 1 tablet by mouth daily.    [provider]  mupirocin ointment (BACTROBAN) 2 % APPLY INTO THE NOSE AS DIRECTED TWICE DAILY 06/11/20 06/11/21  Wyatt Portela, MD  sodium chloride (NASAL MOIST) 0.65 % nasal spray Place 1 spray into the nose as needed for congestion.    [provider]  solifenacin (VESICARE) 5 MG tablet TAKE 1 TABLET BY MOUTH DAILY. Patient taking differently:  Take 5 mg by mouth daily. 01/15/20 01/14/21  Bruning, Ashlyn, PA-C  TURMERIC PO Take 1 tablet by mouth daily.    [provider]    Allergies    Patient has no known allergies.  Review of Systems   Review of Systems  All other systems reviewed and are negative.   Physical Exam Updated Vital Signs BP (!) 145/85 (BP Location: Left Arm)   Pulse 75   Temp 98 F (36.7 C) (Oral)   Resp 16   Ht _0  (1.549 m)   Wt 56 kg   SpO2 100%   BMI 23.33 kg/m   Physical Exam Vitals and nursing note reviewed.  Constitutional:      General: She is not in acute distress.    Appearance: She is well-developed.  HENT:     Head: Normocephalic and atraumatic.  Eyes:     Extraocular Movements: Extraocular movements intact.     Conjunctiva/sclera: Conjunctivae normal.     Pupils: Pupils are equal, round, and reactive to light.  Cardiovascular:     Rate and Rhythm: Normal rate and regular rhythm.     Heart sounds: No murmur heard.   Pulmonary:     Effort: Pulmonary effort is normal. No respiratory distress.     Breath sounds: Normal breath sounds.  Abdominal:     Palpations: Abdomen is soft.     Tenderness: There is no abdominal tenderness.  Musculoskeletal:        General: Normal range of motion.     Cervical back: Neck supple.     Comments: Moves all extremities  Skin:    General: Skin is warm and dry.  Neurological:     Mental Status: She is alert.     Comments: CN III-XII intact Speech is clear Movements are goal oriented Normal finger-to-nose No pronator drift Follows commands Sensation and strength intact throughout  Psychiatric:        Mood and Affect: Mood normal.        Behavior: Behavior normal.     Comments: Calm, cooperative, answers questions appropriately     ED Results / Procedures / Treatments   Labs (all labs ordered are listed, but only abnormal results are displayed) Labs Reviewed - No data to display  EKG None  Radiology No results  found.  Procedures Procedures   Medications Ordered in ED Medications - No data to display  ED Course  I have reviewed the triage vital signs and the nursing notes.  Pertinent labs & imaging results that were available during my care of the patient were reviewed by  me and considered in my medical decision making (see chart for details).    MDM Rules/Calculators/A&P                          Patient here with complaint of headache.  She has had prolonged emergency department stay this month for psychiatric condition.  She was cleared by psychiatry almost a week ago.  She had reportedly been staying at an extended stay hotel.  Tonight she was agitated upon arrival, but knows calm and cooperative.  She answers my questions appropriately.  She has normal neurologic exam tonight.  I have a low suspicion for new intracranial process.  I do not think that she requires any advanced imaging tonight.  I do not think that she is a danger to herself or others.  I do not think that any further work-up is indicated at this point.  Patient self reports that her headache is from sinus congestion.  I discussed using nasal saline or Flonase.  Patient appears stable for discharge.   Final Clinical Impression(s) / ED Diagnoses Final diagnoses:  Sinus congestion    Rx / DC Orders ED Discharge Orders    None       Montine Circle, PA-C 10/25/20 0144    Fatima Blank, MD 10/25/20 [phone]

## 2020-10-25 NOTE — ED Notes (Signed)
[employer] at bedside, attempted multiple times to encourage pt to leave the premises peacefully, pt continues to refuse to leave. [employer] placed pt under arrest at this time & forcefully removed pt from the premises in police custody. Pt is being taken to jail at this time. While being taken to the officers vehicle pt repeatedly screaming "RAPE RAPE RAPE, [place], [place], [place]" . Pt placed in handcuffs and placed in the back of police car, NADN, continues to scream and yell.

## 2020-10-28 ENCOUNTER — Encounter (HOSPITAL_COMMUNITY): Payer: Self-pay | Admitting: *Deleted

## 2020-10-28 ENCOUNTER — Emergency Department (HOSPITAL_COMMUNITY)
Admission: EM | Admit: 2020-10-28 | Discharge: 2020-10-28 | Disposition: A | Discharge status: Home / Self Care | Payer: Medicare Other | Attending: Emergency Medicine | Admitting: Emergency Medicine

## 2020-10-28 DIAGNOSIS — R220 Localized swelling, mass and lump, head: Secondary | ICD-10-CM | POA: Diagnosis not present

## 2020-10-28 DIAGNOSIS — Z5321 Procedure and treatment not carried out due to patient leaving prior to being seen by health care provider: Secondary | ICD-10-CM | POA: Diagnosis not present

## 2020-10-28 NOTE — ED Notes (Signed)
Pt eloped. Pt did not tell staff she was leaving.

## 2020-10-28 NOTE — ED Notes (Signed)
Pt has ran out of the ER doors x2 at this point, last time screaming "bloody murder." Both times she has come back in stating that we are not taking her seriously.  Pt is back in triage 2 at this time waiting for placement in a treatment room.

## 2020-10-28 NOTE — ED Triage Notes (Signed)
Emergency Medicine Provider Triage Evaluation Note  Kaitlyn Duncan , a 50 y.o. female  was evaluated in triage.  Pt complains of bumps on her scalp which have been present for an unknown time.  She states that she is concerned about head trauma, though cannot recall any recently.  States she might of hit her head sometime ago.  She denies any vision changes, nausea, vomiting.  Noted some bumps on her head that have been present for an undetermined amount of time.  She denies any fevers, chills, drainage.  Per chart review, has a history of melanoma of the skin with brain metastasis.  Prior history of paranoia and psychosis.  Review of Systems  Positive: As above Negative: As above  Physical Exam  BP 113/74 (BP Location: Left Arm)   Pulse (!) 107   Temp 98.8 F (37.1 C) (Oral)   Resp 18   SpO2 96%  Gen:   Awake, no distress   HEENT:  Atraumatic  Resp:  Normal effort  Cardiac:  Normal rate  Abd:   Nondistended, nontender  MSK:   Moves extremities without difficulty, flesh-colored 1 to 2 cm in diameter masses which are superficial to the right posterior scalp.  No surrounding erythema, drainage, fluctuance. Neuro:  Speech clear   Medical Decision Making  Medically screening exam initiated at 2:22 PM.  Appropriate orders placed.  Carlyon Shadow [place] was informed that the remainder of the evaluation will be completed by another provider, this initial triage assessment does not replace that evaluation, and the importance of remaining in the ED until their evaluation is complete.  Clinical Impression  Stable for evaluation by a provider.   Garald Balding, PA-C 10/28/20 1424

## 2020-10-28 NOTE — ED Triage Notes (Signed)
Pt was banging on triage door, yelling "saline" and "embolism". She says she would like head trauma evaluated. She cannot recall a head injury but reports she has had head pain and bumps on her scalp. No pain currently.

## 2020-10-29 ENCOUNTER — Emergency Department (HOSPITAL_COMMUNITY)
Admission: EM | Admit: 2020-10-29 | Discharge: 2020-10-30 | Disposition: A | Discharge status: Other Acute Inpatient Hospital | Payer: Medicare Other | Attending: Emergency Medicine | Admitting: Emergency Medicine

## 2020-10-29 ENCOUNTER — Other Ambulatory Visit: Payer: Self-pay

## 2020-10-29 ENCOUNTER — Emergency Department (HOSPITAL_COMMUNITY): Payer: Medicare Other

## 2020-10-29 DIAGNOSIS — E119 Type 2 diabetes mellitus without complications: Secondary | ICD-10-CM | POA: Insufficient documentation

## 2020-10-29 DIAGNOSIS — Z452 Encounter for adjustment and management of vascular access device: Secondary | ICD-10-CM | POA: Diagnosis not present

## 2020-10-29 DIAGNOSIS — Z20822 Contact with and (suspected) exposure to covid-19: Secondary | ICD-10-CM | POA: Insufficient documentation

## 2020-10-29 DIAGNOSIS — Z85828 Personal history of other malignant neoplasm of skin: Secondary | ICD-10-CM | POA: Diagnosis not present

## 2020-10-29 DIAGNOSIS — F29 Unspecified psychosis not due to a substance or known physiological condition: Secondary | ICD-10-CM | POA: Insufficient documentation

## 2020-10-29 DIAGNOSIS — Z79899 Other long term (current) drug therapy: Secondary | ICD-10-CM | POA: Diagnosis not present

## 2020-10-29 DIAGNOSIS — F22 Delusional disorders: Secondary | ICD-10-CM | POA: Diagnosis not present

## 2020-10-29 DIAGNOSIS — Z046 Encounter for general psychiatric examination, requested by authority: Secondary | ICD-10-CM | POA: Diagnosis present

## 2020-10-29 LAB — I-STAT BETA HCG BLOOD, ED (MC, WL, AP ONLY): I-stat hCG, quantitative: <5 m[IU]/mL (ref <5)

## 2020-10-29 LAB — COMPREHENSIVE METABOLIC PANEL
ALT: 20 U/L (ref 0–44)
AST: 41 U/L (ref 15–41)
Albumin: 4.6 g/dL (ref 3.5–5.0)
Alkaline Phosphatase: 89 U/L (ref 38–126)
Anion gap: 10 (ref 5–15)
BUN: 28 mg/dL — ABNORMAL HIGH (ref 6–20)
CO2: 23 mmol/L (ref 22–32)
Calcium: 9.5 mg/dL (ref 8.9–10.3)
Chloride: 107 mmol/L (ref 98–111)
Creatinine, Ser: 0.44 mg/dL (ref 0.44–1.00)
GFR, Estimated: >60 mL/min (ref >60)
Glucose, Bld: 121 mg/dL — ABNORMAL HIGH (ref 70–99)
Potassium: 3.1 mmol/L — ABNORMAL LOW (ref 3.5–5.1)
Sodium: 140 mmol/L (ref 135–145)
Total Bilirubin: 0.3 mg/dL (ref 0.3–1.2)
Total Protein: 7.4 g/dL (ref 6.5–8.1)

## 2020-10-29 LAB — RAPID URINE DRUG SCREEN, HOSP PERFORMED
Amphetamines: NOT DETECTED
Barbiturates: NOT DETECTED
Benzodiazepines: NOT DETECTED
Cocaine: NOT DETECTED
Opiates: NOT DETECTED
Tetrahydrocannabinol: NOT DETECTED

## 2020-10-29 LAB — URINALYSIS, ROUTINE W REFLEX MICROSCOPIC
Bilirubin Urine: NEGATIVE
Glucose, UA: NEGATIVE mg/dL
Hgb urine dipstick: NEGATIVE
Ketones, ur: 20 mg/dL — AB
Leukocytes,Ua: NEGATIVE
Nitrite: NEGATIVE
Protein, ur: NEGATIVE mg/dL
Specific Gravity, Urine: 1.011 (ref 1.005–1.030)
pH: 6 (ref 5.0–8.0)

## 2020-10-29 LAB — CBC WITH DIFFERENTIAL/PLATELET
Abs Immature Granulocytes: 0.03 10*3/uL (ref 0.00–0.07)
Basophils Absolute: 0 10*3/uL (ref 0.0–0.1)
Basophils Relative: 0 %
Eosinophils Absolute: 0 10*3/uL (ref 0.0–0.5)
Eosinophils Relative: 0 %
HCT: 38.7 % (ref 36.0–46.0)
Hemoglobin: 12.5 g/dL (ref 12.0–15.0)
Immature Granulocytes: 1 %
Lymphocytes Relative: 13 %
Lymphs Abs: 0.9 10*3/uL (ref 0.7–4.0)
MCH: 28.3 pg (ref 26.0–34.0)
MCHC: 32.3 g/dL (ref 30.0–36.0)
MCV: 87.6 fL (ref 80.0–100.0)
Monocytes Absolute: 0.5 10*3/uL (ref 0.1–1.0)
Monocytes Relative: 8 %
Neutro Abs: 5.2 10*3/uL (ref 1.7–7.7)
Neutrophils Relative %: 78 %
Platelets: 70 10*3/uL — ABNORMAL LOW (ref 150–400)
RBC: 4.42 MIL/uL (ref 3.87–5.11)
RDW: 12.5 % (ref 11.5–15.5)
WBC: 6.6 10*3/uL (ref 4.0–10.5)
nRBC: 0 % (ref 0.0–0.2)

## 2020-10-29 LAB — ETHANOL: Alcohol, Ethyl (B): <10 mg/dL (ref <10)

## 2020-10-29 LAB — RESP PANEL BY RT-PCR (FLU A&B, COVID) ARPGX2
Influenza A by PCR: NEGATIVE
Influenza B by PCR: NEGATIVE
SARS Coronavirus 2 by RT PCR: NEGATIVE

## 2020-10-29 MED ORDER — RISPERIDONE 1 MG PO TBDP
2.0000 mg | ORAL_TABLET | Freq: Three times a day (TID) | ORAL | Status: DC | PRN
  Administered 2020-10-30: 2 mg via ORAL
  Filled 2020-10-29: qty 2

## 2020-10-29 MED ORDER — LORAZEPAM 1 MG PO TABS
1.0000 mg | ORAL_TABLET | ORAL | Status: AC | PRN
  Administered 2020-10-30: 1 mg via ORAL
  Filled 2020-10-29: qty 1

## 2020-10-29 MED ORDER — STERILE WATER FOR INJECTION IJ SOLN
INTRAMUSCULAR | Status: AC
  Filled 2020-10-29: qty 10

## 2020-10-29 MED ORDER — ZIPRASIDONE MESYLATE 20 MG IM SOLR
20.0000 mg | INTRAMUSCULAR | Status: AC | PRN
  Administered 2020-10-29: 20 mg via INTRAMUSCULAR
  Filled 2020-10-29: qty 20

## 2020-10-29 NOTE — ED Notes (Signed)
Sitter at bedside.
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
## 2020-10-29 NOTE — ED Notes (Signed)
Pt sitting in the bed eating supper.

## 2020-10-29 NOTE — Progress Notes (Addendum)
504 pm TOC CM received call from pt's father, Jere Vanburen. States he has hired an Forensic psychologist in [place] to assist him with pt, [employer], Reubin Milan. States he has TOC CM/CSW contact information and is wanting IP psych placement for pt. Pt is currently homeless living in hotels. He is concerned for her safety. TOC CM sent message to ED Psych team for IP psych placement. TOC CM faxed out to additional and will follow up on IP Psych placement.  [place], [place] ED TOC CM [phone]

## 2020-10-29 NOTE — ED Notes (Signed)
Pt resting in bed at this time. Pt appears to be sleeping. Equal rise and fall of chest noted. NAD noted. Sitter remains at bedside.

## 2020-10-29 NOTE — BH Assessment (Addendum)
[place] Assessment Progress Note  Per Thomes Lolling, NP, this pt requires psychiatric hospitalization at a facility providing specialty care for geriatric or med-psych patients this time.  Pt presents under IVC initiated by law enforcement and upheld by EDP Varney Biles, MD.  The following facilities have been contacted to seek placement for this pt, with results as noted:  Beds available, information sent, decision pending: [facility]: [facility] (unit currently closed)   Jalene Mullet, [place] Coordinator [phone]

## 2020-10-29 NOTE — ED Notes (Signed)
Pt changed into scrubs at this time. Pts belongings placed in storage cabinet at nurses station on [facility] side.

## 2020-10-29 NOTE — ED Notes (Signed)
Message from Social Worker:  Received a call from [place] at [facility] stating that patient has been accepted to their [facility] for admission 10/30/20 (after 8am). The accepting provider is Dr. Jonelle Sports. Nurse report [phone].

## 2020-10-29 NOTE — BH Assessment (Addendum)
Received a call from Jonelle Sidle at [facility] stating that patient has been accepted to their [facility] for admission 10/30/20 (after 8am). The accepting provider is Dr. Jonelle Sports. Nurse report [phone]. Provided updates to patient's nurses Verline Lema, RN, Rolla Plate, RN, [employer], RN) and EDP (Dr. Darl Householder).

## 2020-10-29 NOTE — ED Notes (Signed)
PD remains at bedside.

## 2020-10-29 NOTE — ED Notes (Signed)
Pt stating her left heel is hurting on examination, no sores noted. Pillow placed at pts calves to elevate heels. Bandage placed on heel. Pt was thankful and sock reapplied to left foot. Pt is sitting up in bed eating peanut butter.
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
## 2020-10-29 NOTE — ED Notes (Signed)
Pt refused vitals 

## 2020-10-29 NOTE — ED Notes (Signed)
TTS at bedside. 

## 2020-10-29 NOTE — ED Notes (Signed)
Pt continues to be resting at this time. NAD noted. Equal rise and fall of chest noted. Sitter remains at bedsid.e

## 2020-10-29 NOTE — ED Triage Notes (Signed)
Pt arrives in police custody under IVC. Pt is yelling at staff and police. Pt yelling "This has nothing to do with you. This is only between me, [place], [place], [place], [place], and the [place]" Pt attempting to leave. Pt redirected to bed by police. Pt is noncompliant and aggressive. Pt refusing all vitals or care at this time, stating "who are you? Ok! This has nothing to do with you. Get away"

## 2020-10-29 NOTE — ED Notes (Signed)
Pt continues to yell and scream. Pt trying to kick at staff. Pt remains in restraints at this time. Sitter at bedside.

## 2020-10-29 NOTE — ED Provider Notes (Addendum)
[facility] DEPT Provider Note   CSN: [id number] Arrival date & time: 10/29/20  0945     History Chief Complaint  Patient presents with  . Psychiatric Evaluation    Kaitlyn Duncan is a 50 y.o. female.  HPI    50 year old female comes in a chief complaint of psychiatric evaluation.  Patient was brought from [place] car station by PD.  They have involuntarily committed the patient.  Allegedly patient was at [facility], sounding paranoid and delusional.  She wanted to drive to [place], the staff over there was concerned about her being behind the wheel and called PD.  Patient reports that she found a ministry in [place] that can save her and get rid of the demonic possessions that have taken over her.  She lives in a hotel normally.  She does not have any SI, HI.  Patient was seen by psychiatry service and kept in the ER for several days earlier this month.  Ultimately she was discharged back to her motel.  Past Medical History:  Diagnosis Date  . Anemia   . Anemia   . Diabetes mellitus without complication (HCC)    prediabetic  . GERD (gastroesophageal reflux disease)   . melanoma with met dz dx'd 01/2018   brain and adrenal  . Seasonal allergies     Patient Active Problem List   Diagnosis Date Noted  . Psychosis ([place])   . 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 Melanoma of skin ([place]) 05/29/2018  . Goals of care, counseling/discussion 05/29/2018  . Brain metastasis ([place]) 01/26/2018    Past Surgical History:  Procedure Laterality Date  . BIOPSY  01/28/2018   Procedure: BIOPSY;  Surgeon: Laurence Spates, MD;  Location: [facility];  Service: Endoscopy;;  . BREAST BIOPSY Left   . ESOPHAGOGASTRODUODENOSCOPY N/A 01/28/2018   Procedure: ESOPHAGOGASTRODUODENOSCOPY (EGD);  Surgeon: Laurence Spates, MD;  Location: [facility];  Service: Endoscopy;  Laterality: N/A;  . MOUTH SURGERY    . OVARY SURGERY      removed "something"     OB History    Gravida  0   Para  0   Term  0   Preterm  0   AB  0   Living  0     SAB  0   IAB  0   Ectopic  0   Multiple  0   Live Births  0           Family History  Problem Relation Age of Onset  . Hypertension Father   . Breast cancer Maternal Aunt   . Breast cancer Paternal Aunt   . Diabetes Paternal Aunt   . Breast cancer Maternal Grandmother   . Breast cancer Paternal Grandmother   . Diabetes Paternal Grandmother   . Melanoma Mother   . Colon cancer Neg Hx   . Esophageal cancer Neg Hx   . Pancreatic cancer Neg Hx   . Stomach cancer Neg Hx   . Liver disease Neg Hx     Social History   Tobacco Use  . Smoking status: Never Smoker  . Smokeless tobacco: Never Used  Vaping Use  . Vaping Use: Never used  Substance Use Topics  . Alcohol use: No  . Drug use: No    Home Medications Prior to Admission medications   Medication Sig Start Date End Date Taking? Authorizing Provider  Acetaminophen 500 MG capsule Take 1,000 mg by mouth every 6 (  six) hours as needed for fever or pain.    [provider]  COLLAGEN PO Take 1 tablet by mouth daily.    [provider]  fluticasone (FLONASE) 50 MCG/ACT nasal spray Place 2 sprays into both nostrils daily. 10/25/20   Montine Circle, PA-C  Lactobacillus (PROBIOTIC ACIDOPHILUS PO) Take 2 Doses by mouth daily.  10/04/19   [provider]  loratadine (CLARITIN) 10 MG tablet Take 10 mg by mouth daily.    [provider]  Milk Thistle 175 MG CAPS Take 175 mg by mouth daily. 10/04/19   [provider]  Multiple Vitamin (MULTIVITAMIN WITH MINERALS) TABS tablet Take 1 tablet by mouth daily.    [provider]  mupirocin ointment (BACTROBAN) 2 % APPLY INTO THE NOSE AS DIRECTED TWICE DAILY 06/11/20 06/11/21  Wyatt Portela, MD  sodium chloride (NASAL MOIST) 0.65 % nasal spray Place 1 spray into the nose as needed for congestion.    [provider]  solifenacin (VESICARE) 5 MG tablet TAKE 1 TABLET BY MOUTH DAILY. Patient taking differently: Take 5 mg by mouth daily. 01/15/20 01/14/21  Bruning, Ashlyn, PA-C  TURMERIC PO Take 1 tablet by mouth daily.    [provider]    Allergies    Patient has no known allergies.  Review of Systems   Review of Systems  Unable to perform ROS: Psychiatric disorder  Constitutional: Positive for activity change.  Psychiatric/Behavioral: Positive for agitation.    Physical Exam Updated Vital Signs BP 122/84   Pulse 62   Temp 97.9 F (36.6 C) (Oral)   Resp 18   SpO2 100%   Physical Exam Vitals and nursing note reviewed.  Constitutional:      Appearance: She is well-developed.  HENT:     Head: Normocephalic and atraumatic.  Cardiovascular:     Rate and Rhythm: Normal rate.  Pulmonary:     Effort: Pulmonary effort is normal.  Abdominal:     General: Bowel sounds are normal.  Musculoskeletal:     Cervical back: Normal range of motion and neck supple.  Skin:    General: Skin is warm and dry.  Neurological:     Mental Status: She is alert.  Psychiatric:     Comments: Agitated, hostile     ED Results / Procedures / Treatments   Labs (all labs ordered are listed, but only abnormal results are displayed) Labs Reviewed  COMPREHENSIVE METABOLIC PANEL - Abnormal; Notable for the following components:      Result Value   Potassium 3.1 (*)    Glucose, Bld 121 (*)    BUN 28 (*)    All other components within normal limits  CBC WITH DIFFERENTIAL/PLATELET - Abnormal; Notable for the following components:   Platelets 70 (*)    All other components within normal limits  RESP PANEL BY RT-PCR (FLU A&B, COVID) ARPGX2  ETHANOL  I-STAT BETA HCG BLOOD, ED ([facility], [facility], [facility] ONLY)    EKG EKG Interpretation  Date/Time:  Wednesday October 29 2020 11:08:03 EDT Ventricular Rate:  88 PR Interval:  150 QRS Duration: 84 QT Interval:  370 QTC Calculation: 447 R  Axis:   100 Text Interpretation: Normal sinus rhythm Rightward axis Borderline ECG No acute changes No significant change since last tracing Confirmed by Varney Biles ([id number]) on 10/29/2020 11:24:14 AM   Radiology No results found.  Procedures Procedures   Medications Ordered in ED Medications  risperiDONE (RISPERDAL M-TABS) disintegrating tablet 2 mg (has no administration  in time range)    And  LORazepam (ATIVAN) tablet 1 mg (has no administration in time range)    And  ziprasidone (GEODON) injection 20 mg (20 mg Intramuscular Given 10/29/20 1127)  sterile water (preservative free) injection (  See Procedure Record 10/29/20 1128)    ED Course  I have reviewed the triage vital signs and the nursing notes.  Pertinent labs & imaging results that were available during my care of the patient were reviewed by me and considered in my medical decision making (see chart for details).    MDM Rules/Calculators/A&P                          49-year-old comes in a chief complaint of paranoia.  IVC completed by PD.  She is hostile and angry.  She has no SI, HI.  She does indicate to me that there is a ministry in [place] that can help rid her demonic possession.  She has no SI, HI.  Patient is still paranoid.  PD is concerned about her judgment, decision making capacity.  They think if she were to drive, she would end up harming people.  I will keep the IVC, and get TTS involved.  I recommend patient might be cleared by psychiatry as she had similar symptoms last time when she was in the ER. Police informed me that they are trying to revoke her license, which probably is not a bad idea.  Final Clinical Impression(s) / ED Diagnoses Final diagnoses:  Psychosis, unspecified psychosis type (HCC)    Rx / DC Orders ED Discharge Orders    None       , , MD 10/29/20 1531    , , MD 10/29/20 1532  

## 2020-10-29 NOTE — BH Assessment (Signed)
TTS Counselor spoke to [place] NT, to put Pt in a private room to complete TTS assessment.  Clinician to call the cart.

## 2020-10-30 DIAGNOSIS — Z85828 Personal history of other malignant neoplasm of skin: Secondary | ICD-10-CM | POA: Diagnosis not present

## 2020-10-30 DIAGNOSIS — Z560 Unemployment, unspecified: Secondary | ICD-10-CM | POA: Diagnosis not present

## 2020-10-30 DIAGNOSIS — F22 Delusional disorders: Secondary | ICD-10-CM | POA: Diagnosis not present

## 2020-10-30 DIAGNOSIS — E119 Type 2 diabetes mellitus without complications: Secondary | ICD-10-CM | POA: Diagnosis not present

## 2020-10-30 DIAGNOSIS — F29 Unspecified psychosis not due to a substance or known physiological condition: Secondary | ICD-10-CM | POA: Diagnosis not present

## 2020-10-30 DIAGNOSIS — Z79899 Other long term (current) drug therapy: Secondary | ICD-10-CM | POA: Diagnosis not present

## 2020-10-30 DIAGNOSIS — Z20822 Contact with and (suspected) exposure to covid-19: Secondary | ICD-10-CM | POA: Diagnosis not present

## 2020-10-30 DIAGNOSIS — F200 Paranoid schizophrenia: Secondary | ICD-10-CM | POA: Diagnosis not present

## 2020-10-30 DIAGNOSIS — Z9114 Patient's other noncompliance with medication regimen: Secondary | ICD-10-CM | POA: Diagnosis not present

## 2020-10-30 DIAGNOSIS — Z5901 Sheltered homelessness: Secondary | ICD-10-CM | POA: Diagnosis not present

## 2020-10-30 DIAGNOSIS — G259 Extrapyramidal and movement disorder, unspecified: Secondary | ICD-10-CM | POA: Diagnosis not present

## 2020-10-30 DIAGNOSIS — Z91410 Personal history of adult physical and sexual abuse: Secondary | ICD-10-CM | POA: Diagnosis not present

## 2020-10-30 NOTE — ED Provider Notes (Signed)
Patient accepted by Dr. Rulon Eisenmenger.  Admitted to the [facility].  This chart was dictated using voice recognition software.  Despite best efforts to proofread,  errors can occur which can change the documentation meaning.      Lennice Sites, DO 10/30/20 [phone]

## 2020-10-30 NOTE — Progress Notes (Signed)
TOC CM contacted pt's father, Kalena Mander to give an updated. Provided him with contact information for [facility] in [place]. Pt's father was grateful of the assistance with helping his dtr. [place], [place] ED TOC CM [phone]

## 2020-10-30 NOTE — ED Notes (Addendum)
Sheriff's department called for transport to [place] to call report to facility but did not get an answer/response

## 2020-10-31 DIAGNOSIS — F200 Paranoid schizophrenia: Secondary | ICD-10-CM | POA: Diagnosis not present

## 2020-11-03 DIAGNOSIS — F200 Paranoid schizophrenia: Secondary | ICD-10-CM | POA: Diagnosis not present

## 2020-11-04 ENCOUNTER — Other Ambulatory Visit (HOSPITAL_COMMUNITY): Payer: Self-pay

## 2020-11-06 ENCOUNTER — Other Ambulatory Visit (HOSPITAL_COMMUNITY): Payer: Self-pay

## 2020-11-11 ENCOUNTER — Other Ambulatory Visit (HOSPITAL_COMMUNITY): Payer: Self-pay

## 2020-11-11 MED ORDER — HALOPERIDOL 10 MG PO TABS
10.0000 mg | ORAL_TABLET | Freq: Two times a day (BID) | ORAL | 0 refills | Status: DC
  Filled 2020-11-11: qty 30, 15d supply, fill #0

## 2020-11-11 MED ORDER — BENZTROPINE MESYLATE 1 MG PO TABS
1.0000 mg | ORAL_TABLET | Freq: Two times a day (BID) | ORAL | 0 refills | Status: DC
  Filled 2020-11-11: qty 30, 15d supply, fill #0

## 2020-11-12 ENCOUNTER — Other Ambulatory Visit (HOSPITAL_COMMUNITY): Payer: Self-pay

## 2020-11-12 ENCOUNTER — Encounter: Payer: Self-pay | Admitting: Gastroenterology

## 2020-11-12 ENCOUNTER — Telehealth: Payer: Self-pay | Admitting: *Deleted

## 2020-11-12 NOTE — Telephone Encounter (Signed)
Met with Ms. Kaitlyn Duncan after patient presented to [facility] lobby and asked receptionist for Dr. Cherlynn Perches nurse. Patient requested schedule of appointments and results from last CT and MRI. Given schedule calendar and copy of CT from 07/16/20 (result previously reviewed by Dr. Alen Blew with patient 07/22/20). Next appt with Dr. Alen Blew 01/28/21. MRI completed 10/05/20, not reviewed with patient. Patient missed last appt with Dr. Mickeal Skinner due to being in ED, no follow up appt scheduled at this time.   Patient stated she did not have a phone at this time, stating that last time she had it was in [facility] ED before she was sent to hospital in [place]. Patient stated she asked the Lucianne Lei from the hospital in [place] to bring her to [facility] ED today so she could look for phone. She states [facility] ED looked for phone and checked [name]. She came to [place] after leaving ED. She states she did not know why she was sent to hospital in [place].   Asked patient where she was going when she left CC, she stated the Library. Asked if she had somewhere to stay (demographics does not give address) tonight, she said maybe outdoors, maybe a hotel. Asked if someone could be called (Father/Sister listed as emergency contacts), she said she did not want anyone called on her behalf.   Kaitlyn Duncan expressed concerned about phone, stating she will not know when they call her for appointments. Asked her if scheduling could call someone else for her. She stated she did not have anyone to call. When reminded about emergency contacts, she stated she did not want them called.   Kaitlyn Duncan asked if she could speak with Delle Reining, [facility] Patient and Family Support, transferred to Garnet Koyanagi, SW w/CC. Kaitlyn Duncan contacted Delle Reining. [name] asked that patient be given her card with her number and tell her she could call [name] at number on card. Ms [name] emailed list of shelters available and information for [employer] and requested patient be  given this information. Informed patient she can go there and use phone during day.   Kaitlyn Duncan accepted paperwork with phone numbers and states she might call [place]. She remains concerned regarding missing phone. Kaitlyn Duncan left [facility] lobby with belongings at [place], stating she was going to a doctor's office across the street. She again declined offer to contact someone on her behalf.

## 2020-11-13 ENCOUNTER — Other Ambulatory Visit (HOSPITAL_COMMUNITY): Payer: Self-pay

## 2020-11-18 ENCOUNTER — Other Ambulatory Visit (HOSPITAL_COMMUNITY): Payer: Self-pay

## 2020-11-18 ENCOUNTER — Telehealth: Payer: Self-pay | Admitting: Internal Medicine

## 2020-11-18 NOTE — Telephone Encounter (Signed)
Call pt's number, number did not work. Called pt's father's number, no answer and no voicemail set up. Finally, called pt's sister who did answer. She said that pt does not currently have a phone number to be reached. I left my contact information and she said that she would have the pt reach out to me to sch appt.

## 2020-11-20 ENCOUNTER — Other Ambulatory Visit (HOSPITAL_COMMUNITY): Payer: Self-pay

## 2020-12-08 ENCOUNTER — Encounter: Payer: Self-pay | Admitting: Oncology

## 2020-12-10 ENCOUNTER — Encounter: Payer: Self-pay | Admitting: Oncology

## 2020-12-16 ENCOUNTER — Encounter: Payer: Self-pay | Admitting: Oncology

## 2020-12-17 ENCOUNTER — Encounter: Payer: Self-pay | Admitting: Oncology

## 2020-12-17 ENCOUNTER — Ambulatory Visit (AMBULATORY_SURGERY_CENTER): Payer: Medicare Other

## 2020-12-17 ENCOUNTER — Other Ambulatory Visit: Payer: Self-pay

## 2020-12-17 VITALS — Ht 61.0 in | Wt 120.2 lb

## 2020-12-17 DIAGNOSIS — K219 Gastro-esophageal reflux disease without esophagitis: Secondary | ICD-10-CM

## 2020-12-17 NOTE — Progress Notes (Signed)
Denies allergies to eggs or soy products. Denies complication of anesthesia or sedation. Denies use of weight loss medication. Denies use of O2.    

## 2020-12-18 ENCOUNTER — Other Ambulatory Visit (HOSPITAL_COMMUNITY): Payer: Self-pay

## 2020-12-22 ENCOUNTER — Telehealth: Payer: Self-pay | Admitting: Oncology

## 2020-12-22 NOTE — Telephone Encounter (Signed)
Called pt to r/s appts per 6/20 sch msg. No answer. Will call again at a later time.

## 2020-12-24 ENCOUNTER — Telehealth: Payer: Self-pay | Admitting: Oncology

## 2020-12-24 NOTE — Telephone Encounter (Signed)
R/s appts times per pt request. Pt aware.

## 2020-12-24 NOTE — Telephone Encounter (Signed)
Called pt using phone number provider in sch msg. No answer. Left a not detailed msg for pt to call back to discuss appts.

## 2020-12-29 ENCOUNTER — Encounter: Payer: Self-pay | Admitting: Oncology

## 2021-01-06 ENCOUNTER — Encounter: Payer: Medicare Other | Admitting: Gastroenterology

## 2021-01-07 ENCOUNTER — Encounter: Payer: Self-pay | Admitting: Oncology

## 2021-01-07 ENCOUNTER — Ambulatory Visit (HOSPITAL_COMMUNITY)
Admission: EM | Admit: 2021-01-07 | Discharge: 2021-01-07 | Disposition: A | Discharge status: Home / Self Care | Payer: Medicaid Other | Attending: Nurse Practitioner | Admitting: Nurse Practitioner

## 2021-01-07 DIAGNOSIS — F29 Unspecified psychosis not due to a substance or known physiological condition: Secondary | ICD-10-CM | POA: Insufficient documentation

## 2021-01-07 DIAGNOSIS — Z85820 Personal history of malignant melanoma of skin: Secondary | ICD-10-CM | POA: Insufficient documentation

## 2021-01-07 DIAGNOSIS — C7931 Secondary malignant neoplasm of brain: Secondary | ICD-10-CM | POA: Insufficient documentation

## 2021-01-07 NOTE — Progress Notes (Signed)
TRIAGE: ROUTINE    01/07/21 0155  [facility] Triage Screening (Walk-ins at [facility] only)  How Did You Hear About [place]? Legal System  What Is the Reason for Your Visit/Call Today? Pt is experiencing psychosis; she states [name] told her that one of two men was paying for her hotel room but that, when she went to check in, no one had paid for the room, resulting in the police being called.  How Long Has This Been Causing You Problems? > than 6 months  Have You Recently Had Any Thoughts About Hurting Yourself? No  Are You Planning to Commit Suicide/Harm Yourself At This time? No  Have you Recently Had Thoughts About [place]? No  Are You Planning To Harm Someone At This Time? No  Are you currently experiencing any auditory, visual or other hallucinations? No  Have You Used Any Alcohol or Drugs in the Past 24 Hours? No  Do you have any current medical co-morbidities that require immediate attention? No  Clinician description of patient physical appearance/behavior: Pt is dressed casually. She is able to answer the questions posed, though she shares she hears from [place], [place], and the [employer].  What Do You Feel Would Help You the Most Today? Housing Assistance  If access to [facility] was not available, would you have sought care in the Emergency Department? Yes  Determination of Need Routine (7 days)  Options For Referral Medication Management;Outpatient Therapy

## 2021-01-07 NOTE — Discharge Instructions (Addendum)
°  Discharge recommendations:  °Patient is to take medications as prescribed. °Please see information for follow-up appointment with psychiatry and therapy. °Please follow up with your primary care provider for all medical related needs.  ° °Therapy: We recommend that patient participate in individual therapy to address mental health concerns. ° ° ° ° °Safety:  °The patient should abstain from use of illicit substances/drugs and abuse of any medications. °If symptoms worsen or do not continue to improve or if the patient becomes actively suicidal or homicidal then it is recommended that the patient return to the closest hospital emergency department, the [facility], or call 911 for further evaluation and treatment. °National Suicide Prevention Lifeline 1-800-SUICIDE or 1-800-273-8255.  °

## 2021-01-07 NOTE — ED Provider Notes (Addendum)
Behavioral Health Urgent Care Medical Screening Exam  Patient Name: Kaitlyn Duncan MRN: [id number] Date of Evaluation: 01/07/21 Chief Complaint:   Diagnosis:  Final diagnoses:  Psychosis, unspecified psychosis type ([place])    History of Present illness: Kaitlyn Duncan is a 50 y.o. female with a history of unspecified psychosis, brain metastasis, and melanoma who presents to [facility] voluntarily with law enforcement for an evaluation due to delusions. Per law enforcement the patient has multiple trespassing charges. She was released from jail today. She then went to a hotel and stated that spirits told her that someone was going to pay for her hotel. Law enforcement reports that the patient stated that she talks to her Dad through spirits. On evaluation patient is alert and oriented x 4, pleasant, and cooperative. Speech is clear and coherent. Patient states that law enforcement brought her to [facility] because she was at the [facility] and was asked to leave because her room was not paid for. She states one of two people was supposed to pay for the room. She states that one of the people is a friend and the other is an acquaintance.  When asked how she knew someone was supposed to pay for the room, she states "because the spirits told me."  Reports mood as euthymic and affect is congruent with mood. Thought process is coherent. Denies auditory and visual hallucinations.  States "I hear the holy spirt talk to me through my spiritual connection." Patient states that she talks to the holy trinity through prayer during her daily devotion. On chart review, it is noted that the patient has presented to the ED on several occasions with similar presentation. Denies suicidal ideations. Denies homicidal ideations. Denies substance abuse.  Patient denies chest pain, cough, shortness of breath, nausea, vomiting, and diarrhea. Patient does not appear to experiencing a medical emergency. Patient states that she would  like to return to the [facility] but acknowledges that she does not have the money. She states that she will go wherever law enforcement takes her. Patient does not appear to be an imminent risk to self or others and does not meet IVC criteria.   Psychiatric Specialty Exam  Presentation  General Appearance:Appropriate for Environment; Neat  Eye Contact:Good  Speech:Clear and Coherent; Normal Rate  Speech Volume:Normal  Handedness:Right   Mood and Affect  Mood:Euthymic  Affect:Congruent   Thought Process  Thought Processes:Coherent  Descriptions of Associations:Intact  Orientation:Full (Time, Place and Person)  Thought Content:Delusions  Diagnosis of Schizophrenia or Schizoaffective disorder in past: No  Duration of Psychotic Symptoms: Greater than six months  Hallucinations:None  Ideas of Reference:Delusions  Suicidal Thoughts:No  Homicidal Thoughts:No   Sensorium  Memory:Immediate Good; Recent Good; Remote Fair  Judgment:Intact  Insight:Poor   Executive Functions  Concentration:Good  Attention Span:Good  [place] of Knowledge:Good  Language:Good   Psychomotor Activity  Psychomotor Activity:Normal   Assets  Assets:Communication Skills; Desire for Improvement; Financial Resources/Insurance; Resilience   Sleep  Sleep:Good  Number of hours:  No data recorded  No data recorded  Physical Exam: Physical Exam Constitutional:      General: She is not in acute distress.    Appearance: She is not ill-appearing, toxic-appearing or diaphoretic.  HENT:     Head: Normocephalic.     Right Ear: External ear normal.     Left Ear: External ear normal.  Eyes:     Conjunctiva/sclera: Conjunctivae normal.     Pupils: Pupils are equal, round, and reactive to  light.  Cardiovascular:     Rate and Rhythm: Normal rate.  Pulmonary:     Effort: Pulmonary effort is normal. No respiratory distress.  Musculoskeletal:        General: Normal range  of motion.  Skin:    General: Skin is warm and dry.  Neurological:     Mental Status: She is alert and oriented to person, place, and time.  Psychiatric:        Speech: Speech normal.        Behavior: Behavior is cooperative.        Thought Content: Thought content is delusional. Thought content is not paranoid. Thought content does not include homicidal or suicidal ideation.   Review of Systems  Constitutional:  Negative for chills, diaphoresis, fever, malaise/fatigue and weight loss.  HENT:  Negative for congestion.   Respiratory:  Negative for cough and shortness of breath.   Cardiovascular:  Negative for chest pain and palpitations.  Gastrointestinal:  Negative for diarrhea, nausea and vomiting.  Neurological:  Negative for dizziness and seizures.  Psychiatric/Behavioral:  Negative for depression, hallucinations (denies), substance abuse and suicidal ideas. The patient is not nervous/anxious.   All other systems reviewed and are negative.  Blood pressure (!) 141/98, pulse 87, temperature 98.6 F (37 C), temperature source Oral, resp. rate 16, SpO2 99 %. There is no height or weight on file to calculate BMI.  Musculoskeletal: Strength & Muscle Tone: within normal limits Gait & Station: normal Patient leans: N/A   [place] MSE Discharge Disposition for Follow up and Recommendations: Based on my evaluation the patient does not appear to have an emergency medical condition and can be discharged with resources and follow up care in outpatient services for Medication Management and Individual Therapy  Patient encouraged to keep follow up appointments with oncology and primary care provider.   Discussed services provided at [facility] Provided with walk-in hours. Transportation provided by [employer].    Rozetta Nunnery, NP 01/07/2021, 2:32 AM

## 2021-01-08 ENCOUNTER — Emergency Department (HOSPITAL_COMMUNITY): Payer: Medicare Other

## 2021-01-08 ENCOUNTER — Emergency Department (HOSPITAL_COMMUNITY)
Admission: EM | Admit: 2021-01-08 | Discharge: 2021-01-08 | Discharge status: Against Medical Advice | Payer: Medicare Other | Attending: Emergency Medicine | Admitting: Emergency Medicine

## 2021-01-08 ENCOUNTER — Encounter (HOSPITAL_COMMUNITY): Payer: Self-pay

## 2021-01-08 DIAGNOSIS — E119 Type 2 diabetes mellitus without complications: Secondary | ICD-10-CM | POA: Insufficient documentation

## 2021-01-08 DIAGNOSIS — R531 Weakness: Secondary | ICD-10-CM | POA: Diagnosis present

## 2021-01-08 DIAGNOSIS — Z79899 Other long term (current) drug therapy: Secondary | ICD-10-CM | POA: Insufficient documentation

## 2021-01-08 DIAGNOSIS — F29 Unspecified psychosis not due to a substance or known physiological condition: Secondary | ICD-10-CM | POA: Insufficient documentation

## 2021-01-08 DIAGNOSIS — Z85820 Personal history of malignant melanoma of skin: Secondary | ICD-10-CM | POA: Insufficient documentation

## 2021-01-08 DIAGNOSIS — Z85841 Personal history of malignant neoplasm of brain: Secondary | ICD-10-CM | POA: Insufficient documentation

## 2021-01-08 LAB — CBC WITH DIFFERENTIAL/PLATELET
Abs Immature Granulocytes: 0.01 10*3/uL (ref 0.00–0.07)
Basophils Absolute: 0 10*3/uL (ref 0.0–0.1)
Basophils Relative: 0 %
Eosinophils Absolute: 0 10*3/uL (ref 0.0–0.5)
Eosinophils Relative: 0 %
HCT: 38.1 % (ref 36.0–46.0)
Hemoglobin: 12.5 g/dL (ref 12.0–15.0)
Immature Granulocytes: 0 %
Lymphocytes Relative: 20 %
Lymphs Abs: 1.2 10*3/uL (ref 0.7–4.0)
MCH: 28.3 pg (ref 26.0–34.0)
MCHC: 32.8 g/dL (ref 30.0–36.0)
MCV: 86.4 fL (ref 80.0–100.0)
Monocytes Absolute: 0.6 10*3/uL (ref 0.1–1.0)
Monocytes Relative: 10 %
Neutro Abs: 4.3 10*3/uL (ref 1.7–7.7)
Neutrophils Relative %: 70 %
Platelets: 77 10*3/uL — ABNORMAL LOW (ref 150–400)
RBC: 4.41 MIL/uL (ref 3.87–5.11)
RDW: 12.4 % (ref 11.5–15.5)
WBC: 6.1 10*3/uL (ref 4.0–10.5)
nRBC: 0 % (ref 0.0–0.2)

## 2021-01-08 LAB — ACETAMINOPHEN LEVEL: Acetaminophen (Tylenol), Serum: <10 ug/mL — ABNORMAL LOW (ref 10–30)

## 2021-01-08 LAB — COMPREHENSIVE METABOLIC PANEL
ALT: 15 U/L (ref 0–44)
AST: 29 U/L (ref 15–41)
Albumin: 4.5 g/dL (ref 3.5–5.0)
Alkaline Phosphatase: 78 U/L (ref 38–126)
Anion gap: 9 (ref 5–15)
BUN: 21 mg/dL — ABNORMAL HIGH (ref 6–20)
CO2: 23 mmol/L (ref 22–32)
Calcium: 9.1 mg/dL (ref 8.9–10.3)
Chloride: 104 mmol/L (ref 98–111)
Creatinine, Ser: 0.59 mg/dL (ref 0.44–1.00)
GFR, Estimated: >60 mL/min (ref >60)
Glucose, Bld: 96 mg/dL (ref 70–99)
Potassium: 3.2 mmol/L — ABNORMAL LOW (ref 3.5–5.1)
Sodium: 136 mmol/L (ref 135–145)
Total Bilirubin: 0.7 mg/dL (ref 0.3–1.2)
Total Protein: 7.2 g/dL (ref 6.5–8.1)

## 2021-01-08 LAB — RAPID URINE DRUG SCREEN, HOSP PERFORMED
Amphetamines: NOT DETECTED
Barbiturates: NOT DETECTED
Benzodiazepines: NOT DETECTED
Cocaine: NOT DETECTED
Opiates: NOT DETECTED
Tetrahydrocannabinol: NOT DETECTED

## 2021-01-08 LAB — ETHANOL: Alcohol, Ethyl (B): <10 mg/dL (ref <10)

## 2021-01-08 LAB — SALICYLATE LEVEL: Salicylate Lvl: <7 mg/dL — ABNORMAL LOW (ref 7.0–30.0)

## 2021-01-08 MED ORDER — POTASSIUM CHLORIDE CRYS ER 20 MEQ PO TBCR
40.0000 meq | EXTENDED_RELEASE_TABLET | Freq: Once | ORAL | Status: AC
  Administered 2021-01-08: 40 meq via ORAL
  Filled 2021-01-08: qty 2

## 2021-01-08 NOTE — ED Notes (Addendum)
Per [place],counselor at [facility] pt has been psych cleared.

## 2021-01-08 NOTE — BH Assessment (Signed)
TTS completed. Waiting on disposition decision by a [facility] provider.

## 2021-01-08 NOTE — BH Assessment (Signed)
Informed by nursing that patient has eloped from the ED. In th event that she returns, nursing asked to carry forward her discharge recommendations.

## 2021-01-08 NOTE — BH Assessment (Addendum)
@   0738, Requested nursing Denton Ar, RN) to place to TTS machine in patient's room for initial TTS assessment. Nursing will place machine in patient's room.  @0750  and 0758, Called TTS machine, no answer. @0759  Notified nursing attempts were made.   @0801 , Called machine and no answer.
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
## 2021-01-08 NOTE — ED Provider Notes (Signed)
[facility] DEPT Provider Note   CSN: [id number] Arrival date & time: 01/08/21  1607     History Chief Complaint  Patient presents with   Psychiatric Evaluation   LEVEL 5 CAVEAT - PSYCHIATRIC ILLNESS  Kaitlyn Duncan is a 50 y.o. female with PMHx Diabetes, GERD, Melanoma with metastasis to the brain who presents to the ED today for psychiatric eval. Per triage report pt complains of weakness "due to unforseen circumstances" and repeatedly stating "I suffered brain trauma I need an MRI" however denies any trauma and unable to articular further.   Per chart review pt was seen at [facility] yesterday after being brought in by law enforcement. Pt was apparently released from jail yesterday due to multiple trespassing charges. She went to a hotel and states that "spirits told her that someone was going to pay for her hotel." Pt was brought in because she was asked to leave the [facility] after her room was not paid for. She was deemed to not be a threat to herself or others and was discharged with resources.   As I enter the room pt reports I need to look at the notes to know further details about her. She states she needs a "full body scan" as she may have recurrent cancer. She is also requesting an "emergent MRI" of her brain today as the last MRI was in April of this year. Pt continues to state she had "brain trauma" however when asked if she fell or hit her head somehow she declines. Pt continues to become irritated with questions and will close her eyes and not answer any questions for a long period of time. She does not elaborate on any neurological symptoms however continues to state she needs to be "checked out from head to toe." She states she has been living on the streets for several days and has not had anything to eat or drink due to "unforseen circumstances" however again will not elaborate on those. She does not elaborate on recently being released from jail  yesterday. Pt denies any SI, HI, or AVH.   The history is provided by the patient and medical records.      Past Medical History:  Diagnosis Date   Allergy    Anemia    Anemia    Diabetes mellitus without complication ([place])    prediabetic   GERD (gastroesophageal reflux disease)    melanoma with met dz dx'd 01/2018   brain and adrenal   Seasonal allergies     Patient Active Problem List   Diagnosis Date Noted   Psychosis ([place])    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   Melanoma of skin ([place]) 05/29/2018   Goals of care, counseling/discussion 05/29/2018   Brain metastasis ([place]) 01/26/2018    Past Surgical History:  Procedure Laterality Date   BIOPSY  01/28/2018   Procedure: BIOPSY;  Surgeon: Laurence Spates, MD;  Location: [facility];  Service: Endoscopy;;   BREAST BIOPSY Left    ESOPHAGOGASTRODUODENOSCOPY N/A 01/28/2018   Procedure: ESOPHAGOGASTRODUODENOSCOPY (EGD);  Surgeon: Laurence Spates, MD;  Location: [facility];  Service: Endoscopy;  Laterality: N/A;   MOUTH SURGERY     OVARY SURGERY     removed "something"     OB History     Gravida  0   Para  0   Term  0   Preterm  0   AB  0   Living  0  SAB  0   IAB  0   Ectopic  0   Multiple  0   Live Births  0           Family History  Problem Relation Age of Onset   Melanoma Mother    Hypertension Father    Breast cancer Maternal Aunt    Breast cancer Paternal Aunt    Diabetes Paternal Aunt    Breast cancer Maternal Grandmother    Breast cancer Paternal Grandmother    Diabetes Paternal Grandmother    Colon cancer Neg Hx    Esophageal cancer Neg Hx    Pancreatic cancer Neg Hx    Stomach cancer Neg Hx    Liver disease Neg Hx    Rectal cancer Neg Hx     Social History   Tobacco Use   Smoking status: Never   Smokeless tobacco: Never  Vaping Use   Vaping Use: Never used  Substance Use Topics   Alcohol use: No   Drug use: No    Home  Medications Prior to Admission medications   Medication Sig Start Date End Date Taking? Authorizing Provider  Acetaminophen 500 MG capsule Take 1,000 mg by mouth every 6 (six) hours as needed for fever or pain.    [provider]  benztropine (COGENTIN) 1 MG tablet Take 1 tablet (1 mg total) by mouth 2 (two) times daily for EPS Patient not taking: Reported on 12/17/2020 11/11/20     COLLAGEN PO Take 1 tablet by mouth daily.    [provider]  fluticasone (FLONASE) 50 MCG/ACT nasal spray Place 2 sprays into both nostrils daily. 10/25/20   Roxy Horseman, PA-C  haloperidol (HALDOL) 10 MG tablet Take 1 tablet (10 mg total) by mouth 2 (two) times daily for psychosis Patient not taking: Reported on 12/17/2020 11/11/20     Lactobacillus (PROBIOTIC ACIDOPHILUS PO) Take 2 Doses by mouth daily.  10/04/19   [provider]  loratadine (CLARITIN) 10 MG tablet Take 10 mg by mouth daily.    [provider]  Milk Thistle 175 MG CAPS Take 175 mg by mouth daily. 10/04/19   [provider]  Multiple Vitamin (MULTIVITAMIN WITH MINERALS) TABS tablet Take 1 tablet by mouth daily.    [provider]  mupirocin ointment (BACTROBAN) 2 % APPLY INTO THE NOSE AS DIRECTED TWICE DAILY 06/11/20 06/11/21  Benjiman Core, MD  simethicone (MYLICON) 125 MG chewable tablet Chew 125 mg by mouth every 6 (six) hours as needed for flatulence.    [provider]  sodium chloride (OCEAN) 0.65 % nasal spray Place 1 spray into the nose as needed for congestion.    [provider]  solifenacin (VESICARE) 5 MG tablet TAKE 1 TABLET BY MOUTH DAILY. Patient taking differently: Take 5 mg by mouth daily. 01/15/20 01/14/21  Bruning, Ashlyn, PA-C  TURMERIC PO Take 1 tablet by mouth daily.    [provider]    Allergies    Patient has no known allergies.  Review of Systems   Review of Systems  Unable to perform ROS: Psychiatric disorder   Physical Exam Updated Vital  Signs BP 117/73   Pulse 84   Temp 98.9 F (37.2 C) (Oral)   Resp 18   SpO2 100%   Physical Exam Vitals and nursing note reviewed.  Constitutional:      Appearance: She is not ill-appearing or diaphoretic.  HENT:     Head: Normocephalic and atraumatic.  Eyes:  Conjunctiva/sclera: Conjunctivae normal.  Cardiovascular:     Rate and Rhythm: Normal rate and regular rhythm.     Pulses: Normal pulses.  Pulmonary:     Effort: Pulmonary effort is normal.     Breath sounds: Normal breath sounds. No wheezing, rhonchi or rales.  Abdominal:     Palpations: Abdomen is soft.     Tenderness: There is no abdominal tenderness.  Musculoskeletal:     Cervical back: Neck supple.  Skin:    General: Skin is warm and dry.  Neurological:     Mental Status: She is alert and oriented to person, place, and time.     Comments: Following commands without difficulty  Psychiatric:        Speech: She is noncommunicative.        Behavior: Behavior is uncooperative.        Thought Content: Thought content does not include homicidal or suicidal ideation.    ED Results / Procedures / Treatments   Labs (all labs ordered are listed, but only abnormal results are displayed) Labs Reviewed  CBC WITH DIFFERENTIAL/PLATELET - Abnormal; Notable for the following components:      Result Value   Platelets 77 (*)    All other components within normal limits  COMPREHENSIVE METABOLIC PANEL - Abnormal; Notable for the following components:   Potassium 3.2 (*)    BUN 21 (*)    All other components within normal limits  SALICYLATE LEVEL - Abnormal; Notable for the following components:   Salicylate Lvl <6.0 (*)    All other components within normal limits  ACETAMINOPHEN LEVEL - Abnormal; Notable for the following components:   Acetaminophen (Tylenol), Serum <10 (*)    All other components within normal limits  ETHANOL  RAPID URINE DRUG SCREEN, HOSP PERFORMED    EKG None  Radiology CT Head Wo  Contrast  Result Date: 01/08/2021 CLINICAL DATA:  Psychiatric evaluation.  History of brain metastases EXAM: CT HEAD WITHOUT CONTRAST TECHNIQUE: Contiguous axial images were obtained from the base of the skull through the vertex without intravenous contrast. COMPARISON:  10/05/2020 FINDINGS: Brain: No evidence of acute infarction, hemorrhage, hydrocephalus, extra-axial collection or mass lesion/mass effect. Confluent low-density in the cerebral white matter which is stable. Discrete metastases are not clearly visible. Vascular: No hyperdense vessel or unexpected calcification. Skull: Normal. Negative for fracture or focal lesion. Sinuses/Orbits: No acute finding. Chronic left maxillary sinusitis with atelectasis. IMPRESSION: Stable post treatment head CT.  No acute finding. Electronically Signed   By: Monte Fantasia M.D.   On: 01/08/2021 08:11    Procedures Procedures   Medications Ordered in ED Medications  potassium chloride SA (KLOR-CON) CR tablet 40 mEq (40 mEq Oral Given 01/08/21 4540)    ED Course  I have reviewed the triage vital signs and the nursing notes.  Pertinent labs & imaging results that were available during my care of the patient were reviewed by me and considered in my medical decision making (see chart for details).  Clinical Course as of 01/08/21 9811  Thu Jan 08, 2021  0701 Potassium(!): 3.2 [MV]  0708 Platelets(!): 77 Chronic [MV]    Clinical Course User Index [MV] Eustaquio Maize, PA-C   MDM Rules/Calculators/A&P                          50 year old female with history of bipolar disorder who presents to the ED today for psychiatric eval.  Continues to request full body scan as  well as emergent MRI of her brain.  Continues to states she has had brain trauma however does not elaborate further.  Per chart review it does appear she has a history of melanoma with brain metastasis, followed by Dr. Mickeal Skinner Dr. Alen Blew.  Recent MRI 04/02 stable to slight decrease in size of  focal enhancement in the medial right parietal lobe compatible with treated disease.  Also has CT chest abdomen and pelvis scheduled for 7/19.  Seen at [facility] yesterday after law enforcement was asked to take her away from [facility] after she did not pay for her hotel room.  She was released from jail yesterday.  She was cleared from a behavioral standpoint and discharged with outpatient resources.  On arrival to the ED today vitals are stable.  Patient continues to request scans at this time.  She is not very cooperative during exam.  She gets frustrated with any questions that you ask her and will repeatedly close her eyes and not answering questions.  History is limited secondary to psychiatric illness at this time.  We will plan for medical screening labs as well as CT head and consulted TTS.   CBC without leukocytosis. Hgb stable for patient at 12.1. Platelets 77 however appears chronic.  CMP with potassium 3.2; will replete. BUN slightly elevated at 12 however creatinine 0.59. No other electrolyte abnormalities EtOH, salicylate, and acetaminophen levels WNL.  UDS negative CT head with stable post treatment findings. No acute abnormalities.  Medically cleared at this time.   Nursing staff informed that pt fled the facility. Pending TTS eval at this time however given no disposition per TTS and concern for pt's safety IVC paperwork initiated. [employer] to go find patient.   Prior to patient being found by [employer] TTS placed note; pt psychiatrically cleared. IVC paperwork not filed at this time. Pt discharged from facility.   This note was prepared using Dragon voice recognition software and may include unintentional dictation errors due to the inherent limitations of voice recognition software.   Final Clinical Impression(s) / ED Diagnoses Final diagnoses:  Psychosis, unspecified psychosis type [facility])    Rx / [place] Orders ED Discharge Orders     None        Eustaquio Maize, PA-C 01/08/21 6160     Milton Ferguson, MD 01/11/21 1646
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
## 2021-01-08 NOTE — BH Assessment (Signed)
Informed by nursing that following the completion of the TTS assessment, patient attempted to elope from the ED. Security intervened to de-escalate the situation. Informed nursing that these details would be provided to the [facility] provider.

## 2021-01-08 NOTE — BH Assessment (Signed)
Per Pecolia Ades, RN, patient has been psych cleared for discharge. She does not meet criteria for inpatient psychiatric services.   Patient recommended to follow up with [facility] outpatient services for med management and therapy.   Also, TOC involvement to assist with patient's psychosocial needs. Reportedly patient is homeless and has significant health issues. Her medical symptomology is suspected to be contributing to her mental health symptoms. Therefore [facility] provider recommending patient to follow up with her PCP and Oncology to address medical concerns.  Requesting TOC to assist patient with those psychosocial needs if possible.   Notified patient's nurse Sharrie Rothman Ward, RN) and EDP (Dr. Roderic Palau) of the above disposition. Also, Disposition Counselor Jalene Mullet).

## 2021-01-08 NOTE — ED Triage Notes (Addendum)
Pt arrived POV, c/o weakness "due to unforseen circumstances" and repeatedly stating "I suffered brain trauma I need and MRI" but denies any trauma and unable to articulate anything further. Continuous statements that do not make sense. Pt getting irritated with questions during triage and continuously repeating the words CT, xray, MRI and brain.   Pt ambulatory to triage. Seen yesterday at [facility] for psychosis, hx of same.

## 2021-01-08 NOTE — ED Notes (Signed)
Pt eloped from this ED. IVC in process. [employer] contacted.

## 2021-01-08 NOTE — Discharge Instructions (Signed)
For your behavioral health needs you are advised to follow up with [facility]:       [facility]      [place], [place] [address]      [phone]      They offer psychiatry/medication management, therapy and substance abuse treatment.  New patients are being seen in their walk-in clinic.  Walk-in hours are Monday - Thursday from 8:00 am - 11:00 am for psychiatry, and Friday from 1:00 pm - 4:00 pm for therapy.  Walk-in patients are seen on a first come, first served basis, so try to arrive as early as possible for the best chance of being seen the same day.

## 2021-01-09 ENCOUNTER — Encounter (HOSPITAL_COMMUNITY): Payer: Self-pay

## 2021-01-09 ENCOUNTER — Emergency Department (HOSPITAL_COMMUNITY)
Admission: EM | Admit: 2021-01-09 | Discharge: 2021-01-09 | Disposition: A | Discharge status: Home / Self Care | Payer: Medicare Other | Attending: Emergency Medicine | Admitting: Emergency Medicine

## 2021-01-09 ENCOUNTER — Other Ambulatory Visit: Payer: Self-pay

## 2021-01-09 ENCOUNTER — Ambulatory Visit (HOSPITAL_COMMUNITY)
Admission: EM | Admit: 2021-01-09 | Discharge: 2021-01-09 | Disposition: A | Discharge status: Home / Self Care | Payer: Medicare Other | Attending: Physician Assistant | Admitting: Physician Assistant

## 2021-01-09 DIAGNOSIS — L559 Sunburn, unspecified: Secondary | ICD-10-CM | POA: Diagnosis not present

## 2021-01-09 DIAGNOSIS — M25572 Pain in left ankle and joints of left foot: Secondary | ICD-10-CM | POA: Diagnosis not present

## 2021-01-09 DIAGNOSIS — R21 Rash and other nonspecific skin eruption: Secondary | ICD-10-CM

## 2021-01-09 DIAGNOSIS — Z5321 Procedure and treatment not carried out due to patient leaving prior to being seen by health care provider: Secondary | ICD-10-CM | POA: Insufficient documentation

## 2021-01-09 MED ORDER — DIPHENHYDRAMINE HCL 50 MG PO TABS: 25.0000 mg | ORAL_TABLET | Freq: Four times a day (QID) | ORAL | 0 refills | Status: DC | PRN

## 2021-01-09 NOTE — Discharge Instructions (Addendum)
Wear socks to keep your shoes from blistering your feet.  Use sunscreen to prevent sunburn if you are outside.  Aloe lotion may help with symptoms from sunburn

## 2021-01-09 NOTE — ED Triage Notes (Addendum)
Patient reports that she has worsening redness to her left foot and left ankle.  Patient checked in with bladder pain, but denies in triage.    Patient jumped up in triage and ran out of the room yelling "[place]."  Patient came back to the registration window and stated she needed to be seen for her foot pain.

## 2021-01-09 NOTE — ED Triage Notes (Signed)
Pt reports nose feels dry and congested  Pt reports bilateral redness and swelling in the 5 th toes   Pt wants urinalysis

## 2021-01-09 NOTE — ED Triage Notes (Signed)
Pt refused answered question.

## 2021-01-09 NOTE — ED Notes (Signed)
Pt keeps walking out of room to leave. She then came back and was told she would have to check back in if she leaves. Pt walked out again.

## 2021-01-09 NOTE — ED Provider Notes (Signed)
[facility]    CSN: [id number] Arrival date & time: 01/09/21  1047      History   Chief Complaint Chief Complaint  Patient presents with   Nose Problem    HPI Liese Dizdarevic is a 50 y.o. female.   Pt seen this am at [facility].  Pt reports she is here now because of sunburn.  Pt has redness on arms, neck and legs.  Pt also has blisters on her little toes from her shoes    Past Medical History:  Diagnosis Date   Allergy    Anemia    Anemia    Diabetes mellitus without complication ([place])    prediabetic   GERD (gastroesophageal reflux disease)    melanoma with met dz dx'd 01/2018   brain and adrenal   Seasonal allergies     Patient Active Problem List   Diagnosis Date Noted   Psychosis ([place])    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   Melanoma of skin ([place]) 05/29/2018   Goals of care, counseling/discussion 05/29/2018   Brain metastasis ([place]) 01/26/2018    Past Surgical History:  Procedure Laterality Date   BIOPSY  01/28/2018   Procedure: BIOPSY;  Surgeon: Laurence Spates, MD;  Location: [facility];  Service: Endoscopy;;   BREAST BIOPSY Left    ESOPHAGOGASTRODUODENOSCOPY N/A 01/28/2018   Procedure: ESOPHAGOGASTRODUODENOSCOPY (EGD);  Surgeon: Laurence Spates, MD;  Location: [facility];  Service: Endoscopy;  Laterality: N/A;   MOUTH SURGERY     OVARY SURGERY     removed "something"    OB History     Gravida  0   Para  0   Term  0   Preterm  0   AB  0   Living  0      SAB  0   IAB  0   Ectopic  0   Multiple  0   Live Births  0            Home Medications    Prior to Admission medications   Medication Sig Start Date End Date Taking? Authorizing Provider  diphenhydrAMINE (BENADRYL) 50 MG tablet Take 0.5 tablets (25 mg total) by mouth every 6 (six) hours as needed for itching. 01/09/21  Yes Fransico Meadow, PA-C  Acetaminophen 500 MG capsule Take 1,000 mg by mouth every 6 (six)  hours as needed for fever or pain.    [provider]  benztropine (COGENTIN) 1 MG tablet Take 1 tablet (1 mg total) by mouth 2 (two) times daily for EPS Patient not taking: Reported on 12/17/2020 11/11/20     COLLAGEN PO Take 1 tablet by mouth daily.    [provider]  fluticasone (FLONASE) 50 MCG/ACT nasal spray Place 2 sprays into both nostrils daily. 10/25/20   Montine Circle, PA-C  haloperidol (HALDOL) 10 MG tablet Take 1 tablet (10 mg total) by mouth 2 (two) times daily for psychosis Patient not taking: Reported on 12/17/2020 11/11/20     Lactobacillus (PROBIOTIC ACIDOPHILUS PO) Take 2 Doses by mouth daily.  10/04/19   [provider]  loratadine (CLARITIN) 10 MG tablet Take 10 mg by mouth daily.    [provider]  Milk Thistle 175 MG CAPS Take 175 mg by mouth daily. 10/04/19   [provider]  Multiple Vitamin (MULTIVITAMIN WITH MINERALS) TABS tablet Take 1 tablet by mouth daily.    [provider]  mupirocin ointment (BACTROBAN) 2 % APPLY  INTO THE NOSE AS DIRECTED TWICE DAILY 06/11/20 06/11/21  Wyatt Portela, MD  simethicone (MYLICON) 092 MG chewable tablet Chew 125 mg by mouth every 6 (six) hours as needed for flatulence.    [provider]  sodium chloride (OCEAN) 0.65 % nasal spray Place 1 spray into the nose as needed for congestion.    [provider]  solifenacin (VESICARE) 5 MG tablet TAKE 1 TABLET BY MOUTH DAILY. Patient taking differently: Take 5 mg by mouth daily. 01/15/20 01/14/21  Bruning, Ashlyn, PA-C  TURMERIC PO Take 1 tablet by mouth daily.    [provider]    Family History Family History  Problem Relation Age of Onset   Melanoma Mother    Hypertension Father    Breast cancer Maternal Aunt    Breast cancer Paternal Aunt    Diabetes Paternal Aunt    Breast cancer Maternal Grandmother    Breast cancer Paternal Grandmother    Diabetes Paternal Grandmother    Colon cancer Neg Hx     Esophageal cancer Neg Hx    Pancreatic cancer Neg Hx    Stomach cancer Neg Hx    Liver disease Neg Hx    Rectal cancer Neg Hx     Social History Social History   Tobacco Use   Smoking status: Never   Smokeless tobacco: Never  Vaping Use   Vaping Use: Never used  Substance Use Topics   Alcohol use: No   Drug use: No     Allergies   Patient has no known allergies.   Review of Systems Review of Systems  All other systems reviewed and are negative.   Physical Exam Triage Vital Signs ED Triage Vitals [01/09/21 1140]  Enc Vitals Group     BP 130/81     Pulse Rate 85     Resp 18     Temp 99.1 F (37.3 C)     Temp Source Oral     SpO2 100 %     Weight      Height      Head Circumference      Peak Flow      Pain Score      Pain Loc      Pain Edu?      Excl. in [place]?    No data found.  Updated Vital Signs BP 130/81 (BP Location: Right Arm)   Pulse 85   Temp 99.1 F (37.3 C) (Oral)   Resp 18   SpO2 100%   Visual Acuity Right Eye Distance:   Left Eye Distance:   Bilateral Distance:    Right Eye Near:   Left Eye Near:    Bilateral Near:     Physical Exam Vitals reviewed.  Cardiovascular:     Rate and Rhythm: Normal rate.  Pulmonary:     Effort: Pulmonary effort is normal.  Skin:    Findings: Erythema present.     Comments: Erythema back of neck, erythema lower arms and lower legs   Neurological:     General: No focal deficit present.     Mental Status: She is alert.  Psychiatric:        Mood and Affect: Mood normal.     UC Treatments / Results  Labs (all labs ordered are listed, but only abnormal results are displayed) Labs Reviewed - No data to display  EKG   Radiology CT Head Wo Contrast  Result Date: 01/08/2021 CLINICAL DATA:  Psychiatric evaluation.  History  of brain metastases EXAM: CT HEAD WITHOUT CONTRAST TECHNIQUE: Contiguous axial images were obtained from the base of the skull through the vertex without intravenous contrast.  COMPARISON:  10/05/2020 FINDINGS: Brain: No evidence of acute infarction, hemorrhage, hydrocephalus, extra-axial collection or mass lesion/mass effect. Confluent low-density in the cerebral white matter which is stable. Discrete metastases are not clearly visible. Vascular: No hyperdense vessel or unexpected calcification. Skull: Normal. Negative for fracture or focal lesion. Sinuses/Orbits: No acute finding. Chronic left maxillary sinusitis with atelectasis. IMPRESSION: Stable post treatment head CT.  No acute finding. Electronically Signed   By: Monte Fantasia M.D.   On: 01/08/2021 08:11    Procedures Procedures (including critical care time)  Medications Ordered in UC Medications - No data to display  Initial Impression / Assessment and Plan / UC Course  I have reviewed the triage vital signs and the nursing notes.  Pertinent labs & imaging results that were available during my care of the patient were reviewed by me and considered in my medical decision making (see chart for details).     MDM:  Pt had ct and evaluation this am.  Pt advised benadryl, socks and try aloe  Final Clinical Impressions(s) / UC Diagnoses   Final diagnoses:  Sunburn  Rash and nonspecific skin eruption     Discharge Instructions      Wear socks to keep your shoes from blistering your feet.  Use sunscreen to prevent sunburn if you are outside.  Aloe lotion may help with symptoms from sunburn    ED Prescriptions     Medication Sig Dispense Auth. Provider   diphenhydrAMINE (BENADRYL) 50 MG tablet Take 0.5 tablets (25 mg total) by mouth every 6 (six) hours as needed for itching. 30 tablet Fransico Meadow, [place]      PDMP not reviewed this encounter. An After Visit Summary was printed and given to the patient.    Fransico Meadow, [place] 01/09/21 1208

## 2021-01-13 ENCOUNTER — Emergency Department (HOSPITAL_COMMUNITY)
Admission: EM | Admit: 2021-01-13 | Discharge: 2021-01-14 | Disposition: A | Discharge status: Home / Self Care | Payer: Medicare Other | Attending: Emergency Medicine | Admitting: Emergency Medicine

## 2021-01-13 DIAGNOSIS — Z85828 Personal history of other malignant neoplasm of skin: Secondary | ICD-10-CM | POA: Diagnosis not present

## 2021-01-13 DIAGNOSIS — E119 Type 2 diabetes mellitus without complications: Secondary | ICD-10-CM | POA: Insufficient documentation

## 2021-01-13 DIAGNOSIS — M79671 Pain in right foot: Secondary | ICD-10-CM | POA: Diagnosis present

## 2021-01-13 DIAGNOSIS — M79672 Pain in left foot: Secondary | ICD-10-CM | POA: Diagnosis not present

## 2021-01-13 DIAGNOSIS — Z85841 Personal history of malignant neoplasm of brain: Secondary | ICD-10-CM | POA: Insufficient documentation

## 2021-01-13 NOTE — ED Triage Notes (Signed)
[facility] report: called out for bilateral pain to here feet from walking past 3 days. EMS states pt has been wearing tight shoes.

## 2021-01-14 ENCOUNTER — Emergency Department (HOSPITAL_COMMUNITY)
Admission: EM | Admit: 2021-01-14 | Discharge: 2021-01-14 | Disposition: A | Discharge status: Home / Self Care | Payer: Medicare Other | Source: Home / Self Care | Attending: Emergency Medicine | Admitting: Emergency Medicine

## 2021-01-14 ENCOUNTER — Other Ambulatory Visit: Payer: Self-pay

## 2021-01-14 DIAGNOSIS — M79671 Pain in right foot: Secondary | ICD-10-CM

## 2021-01-14 DIAGNOSIS — Z85841 Personal history of malignant neoplasm of brain: Secondary | ICD-10-CM | POA: Insufficient documentation

## 2021-01-14 DIAGNOSIS — M79672 Pain in left foot: Secondary | ICD-10-CM | POA: Insufficient documentation

## 2021-01-14 DIAGNOSIS — E119 Type 2 diabetes mellitus without complications: Secondary | ICD-10-CM | POA: Insufficient documentation

## 2021-01-14 DIAGNOSIS — Z85828 Personal history of other malignant neoplasm of skin: Secondary | ICD-10-CM | POA: Insufficient documentation

## 2021-01-14 MED ORDER — ACETAMINOPHEN 325 MG PO TABS
650.0000 mg | ORAL_TABLET | Freq: Once | ORAL | Status: AC
  Administered 2021-01-14: 650 mg via ORAL
  Filled 2021-01-14: qty 2

## 2021-01-14 NOTE — ED Notes (Signed)
Pt verbalizes understanding of discharge instructions. Opportunity for questions and answers were provided. Pt discharged from the ED.   ?

## 2021-01-14 NOTE — ED Triage Notes (Signed)
Pt c/o bilateral food pain onset for past few week. Worsening pain today. States she has been walking around.

## 2021-01-14 NOTE — ED Provider Notes (Signed)
Emergency Medicine Provider Triage Evaluation Note  Kaitlyn Duncan , a 50 y.o. female  was evaluated in triage.  Pt complains of bilateral foot pain x weeks.  Worsened today.  Reports rash/sunburn.  Denies treatments PTA.  Review of Systems  Positive: Foot pain, rash Negative: Fever, chills  Physical Exam  BP 123/84 (BP Location: Left Arm)   Pulse 77   Temp 98.2 F (36.8 C) (Oral)   Resp 16   SpO2 99%  Gen:   Awake, no distress   Resp:  Normal effort  MSK:   Moves extremities without difficulty  Other:    Medical Decision Making  Medically screening exam initiated at 12:44 AM.  Appropriate orders placed.  Kaitlyn Duncan was informed that the remainder of the evaluation will be completed by another provider, this initial triage assessment does not replace that evaluation, and the importance of remaining in the ED until their evaluation is complete.     Montine Circle, PA-C 54/62/70 3500    Delora Fuel, MD 93/81/82 [phone]

## 2021-01-14 NOTE — ED Notes (Signed)
Called for triage x3, no response.

## 2021-01-14 NOTE — ED Provider Notes (Signed)
[facility] EMERGENCY DEPARTMENT Provider Note   CSN: [id number] Arrival date & time: 01/14/21  0029     History Chief Complaint  Patient presents with   Foot Pain    Kaitlyn Duncan is a 50 y.o. female.  The history is provided by the patient.  Foot Pain This is a new problem. The problem occurs daily. Pertinent negatives include no chest pain, no abdominal pain and no shortness of breath. Nothing aggravates the symptoms. Nothing relieves the symptoms. She has tried nothing for the symptoms. The treatment provided no relief.      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   Melanoma of skin ([place]) 05/29/2018   Goals of care, counseling/discussion 05/29/2018   Brain metastasis ([place]) 01/26/2018    Past Surgical History:  Procedure Laterality Date   BIOPSY  01/28/2018   Procedure: BIOPSY;  Surgeon: Laurence Spates, MD;  Location: [facility];  Service: Endoscopy;;   BREAST BIOPSY Left    ESOPHAGOGASTRODUODENOSCOPY N/A 01/28/2018   Procedure: ESOPHAGOGASTRODUODENOSCOPY (EGD);  Surgeon: Laurence Spates, MD;  Location: [facility];  Service: Endoscopy;  Laterality: N/A;   MOUTH SURGERY     OVARY SURGERY     removed "something"     OB History     Gravida  0   Para  0   Term  0   Preterm  0   AB  0   Living  0      SAB  0   IAB  0   Ectopic  0   Multiple  0   Live Births  0           Family History  Problem Relation Age of Onset   Melanoma Mother    Hypertension Father    Breast cancer Maternal Aunt    Breast cancer Paternal Aunt    Diabetes Paternal Aunt     Breast cancer Maternal Grandmother    Breast cancer Paternal Grandmother    Diabetes Paternal Grandmother    Colon cancer Neg Hx    Esophageal cancer Neg Hx    Pancreatic cancer Neg Hx    Stomach cancer Neg Hx    Liver disease Neg Hx    Rectal cancer Neg Hx     Social History   Tobacco Use   Smoking status: Never   Smokeless tobacco: Never  Vaping Use   Vaping Use: Never used  Substance Use Topics   Alcohol use: No   Drug use: No    Home Medications Prior to Admission medications   Medication Sig Start Date End Date Taking? Authorizing Provider  Acetaminophen 500 MG capsule Take 1,000 mg by mouth every 6 (six) hours as needed for fever or pain.    [provider]  benztropine (COGENTIN) 1 MG tablet Take 1 tablet (1 mg total) by mouth 2 (two) times daily for EPS Patient not taking: Reported on 12/17/2020 11/11/20     COLLAGEN PO Take 1 tablet by mouth daily.    [provider]  diphenhydrAMINE (BENADRYL) 50  MG tablet Take 0.5 tablets (25 mg total) by mouth every 6 (six) hours as needed for itching. 01/09/21   Fransico Meadow, PA-C  fluticasone (FLONASE) 50 MCG/ACT nasal spray Place 2 sprays into both nostrils daily. 10/25/20   Montine Circle, PA-C  haloperidol (HALDOL) 10 MG tablet Take 1 tablet (10 mg total) by mouth 2 (two) times daily for psychosis Patient not taking: Reported on 12/17/2020 11/11/20     Lactobacillus (PROBIOTIC ACIDOPHILUS PO) Take 2 Doses by mouth daily.  10/04/19   [provider]  loratadine (CLARITIN) 10 MG tablet Take 10 mg by mouth daily.    [provider]  Milk Thistle 175 MG CAPS Take 175 mg by mouth daily. 10/04/19   [provider]  Multiple Vitamin (MULTIVITAMIN WITH MINERALS) TABS tablet Take 1 tablet by mouth daily.    [provider]  mupirocin ointment (BACTROBAN) 2 % APPLY INTO THE NOSE AS DIRECTED TWICE DAILY 06/11/20 06/11/21  Wyatt Portela, MD  simethicone (MYLICON) 119 MG chewable tablet  Chew 125 mg by mouth every 6 (six) hours as needed for flatulence.    [provider]  sodium chloride (OCEAN) 0.65 % nasal spray Place 1 spray into the nose as needed for congestion.    [provider]  solifenacin (VESICARE) 5 MG tablet TAKE 1 TABLET BY MOUTH DAILY. Patient taking differently: Take 5 mg by mouth daily. 01/15/20 01/14/21  Bruning, Ashlyn, PA-C  TURMERIC PO Take 1 tablet by mouth daily.    [provider]    Allergies    Patient has no known allergies.  Review of Systems   Review of Systems  Respiratory:  Negative for cough and shortness of breath.   Cardiovascular:  Negative for chest pain and palpitations.  Gastrointestinal:  Negative for abdominal pain and vomiting.  Genitourinary:  Negative for dysuria and hematuria.  Musculoskeletal:  Positive for arthralgias (bilateral feet pain). Negative for back pain.  Skin:  Negative for color change and rash.  All other systems reviewed and are negative.  Physical Exam Updated Vital Signs  ED Triage Vitals  Enc Vitals Group     BP 01/14/21 0034 123/84     Pulse Rate 01/14/21 0034 77     Resp 01/14/21 0034 16     Temp 01/14/21 0034 98.2 F (36.8 C)     Temp Source 01/14/21 0034 Oral     SpO2 01/14/21 0034 99 %     Weight --      Height --      Head Circumference --      Peak Flow --      Pain Score 01/14/21 0044 7     Pain Loc --      Pain Edu? --      Excl. in [place]? --      Physical Exam Constitutional:      General: She is not in acute distress.    Appearance: She is not ill-appearing.  Cardiovascular:     Pulses: Normal pulses.  Pulmonary:     Effort: Pulmonary effort is normal.  Musculoskeletal:        General: No swelling or tenderness. Normal range of motion.  Skin:    General: Skin is warm.     Findings: No erythema or rash.  Neurological:     General: No focal deficit present.     Mental Status: She is alert.     Sensory: No sensory deficit.     Motor: No  weakness.     ED Results / Procedures / Treatments   Labs (all labs ordered are listed, but only abnormal results are displayed) Labs Reviewed - No data to display  EKG None  Radiology No results found.  Procedures Procedures   Medications Ordered in ED Medications  acetaminophen (TYLENOL) tablet 650 mg (650 mg Oral Given 01/14/21 6812)    ED Course  I have reviewed the triage vital signs and the nursing notes.  Pertinent labs & imaging results that were available during my care of the patient were reviewed by me and considered in my medical decision making (see chart for details).    MDM Rules/Calculators/A&P                          [name] is here with bilateral foot pain.  Currently homeless.  Pain in both feet from she thinks walking around a lot.  She is requesting something to eat and drink as she has been in the streets for a while.  She is requesting some resources to help with financial assistance and shelter and food.  She appears hemodynamically stable.  Exam is unremarkable.  No evidence of infection in her feet.  She has good pulses, good strength and sensation in her lower extremities.  Patient was given food and drink and given resources.  Discharged in good condition.  This chart was dictated using voice recognition software.  Despite best efforts to proofread,  errors can occur which can change the documentation meaning.   Final Clinical Impression(s) / ED Diagnoses Final diagnoses:  Foot pain, bilateral    Rx / DC Orders ED Discharge Orders     None        Lennice Sites, DO 01/14/21 7517

## 2021-01-15 ENCOUNTER — Encounter (HOSPITAL_COMMUNITY): Payer: Self-pay

## 2021-01-15 ENCOUNTER — Emergency Department (HOSPITAL_COMMUNITY)
Admission: EM | Admit: 2021-01-15 | Discharge: 2021-01-15 | Discharge status: Against Medical Advice | Payer: Medicare Other

## 2021-01-15 ENCOUNTER — Other Ambulatory Visit: Payer: Self-pay

## 2021-01-15 ENCOUNTER — Encounter (HOSPITAL_COMMUNITY): Payer: Self-pay | Admitting: Emergency Medicine

## 2021-01-15 ENCOUNTER — Emergency Department (HOSPITAL_COMMUNITY)
Admission: EM | Admit: 2021-01-15 | Discharge: 2021-01-15 | Disposition: A | Discharge status: Home / Self Care | Payer: Medicare Other | Attending: Emergency Medicine | Admitting: Emergency Medicine

## 2021-01-15 ENCOUNTER — Emergency Department (HOSPITAL_COMMUNITY)
Admission: EM | Admit: 2021-01-15 | Discharge: 2021-01-16 | Disposition: A | Discharge status: Home / Self Care | Payer: Medicare Other | Source: Home / Self Care | Attending: Emergency Medicine | Admitting: Emergency Medicine

## 2021-01-15 DIAGNOSIS — E860 Dehydration: Secondary | ICD-10-CM | POA: Insufficient documentation

## 2021-01-15 DIAGNOSIS — Z85820 Personal history of malignant melanoma of skin: Secondary | ICD-10-CM | POA: Insufficient documentation

## 2021-01-15 DIAGNOSIS — E119 Type 2 diabetes mellitus without complications: Secondary | ICD-10-CM | POA: Insufficient documentation

## 2021-01-15 DIAGNOSIS — Z5900 Homelessness unspecified: Secondary | ICD-10-CM | POA: Insufficient documentation

## 2021-01-15 DIAGNOSIS — M79672 Pain in left foot: Secondary | ICD-10-CM

## 2021-01-15 DIAGNOSIS — M79671 Pain in right foot: Secondary | ICD-10-CM

## 2021-01-15 DIAGNOSIS — Z85841 Personal history of malignant neoplasm of brain: Secondary | ICD-10-CM | POA: Insufficient documentation

## 2021-01-15 DIAGNOSIS — X58XXXA Exposure to other specified factors, initial encounter: Secondary | ICD-10-CM | POA: Diagnosis not present

## 2021-01-15 DIAGNOSIS — S90425A Blister (nonthermal), left lesser toe(s), initial encounter: Secondary | ICD-10-CM | POA: Diagnosis present

## 2021-01-15 NOTE — ED Triage Notes (Signed)
Per ems, pt reporting bilateral foot pain for the past week. Pt reports she has been walking a lot so her feet hurt. Pt initially called ems because she was wet from the rain. Pt is ambulatory and ems vss.

## 2021-01-15 NOTE — ED Notes (Signed)
Pt seen walking out ED entrance and walking up steps. Pt has not returned.

## 2021-01-15 NOTE — ED Notes (Signed)
Pt verbalizes understanding of d/c instructions. Pt ambulatory at d/c with all belongings. Pt discharged in triage by EDP. See EDP assessment.

## 2021-01-15 NOTE — ED Provider Notes (Signed)
[facility] EMERGENCY DEPARTMENT Provider Note   CSN: [id number] Arrival date & time: 01/15/21  0157     History Chief Complaint  Patient presents with   Foot Pain    Kaitlyn Duncan is a 50 y.o. female.  The history is provided by the patient and medical records.   50 y.o. F with hx of anemia, DM, GERD, presenting to the ED with bilateral foot pain.  Seen multiple times recently for same.  Was out in the rain today and sneakers got wet.  Asking for clean/dry socks.  Reports some blisters on her toes.   Denies fever.  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   Melanoma of skin ([place]) 05/29/2018   Goals of care, counseling/discussion 05/29/2018   Brain metastasis ([place]) 01/26/2018    Past Surgical History:  Procedure Laterality Date   BIOPSY  01/28/2018   Procedure: BIOPSY;  Surgeon: Laurence Spates, MD;  Location: [facility];  Service: Endoscopy;;   BREAST BIOPSY Left    ESOPHAGOGASTRODUODENOSCOPY N/A 01/28/2018   Procedure: ESOPHAGOGASTRODUODENOSCOPY (EGD);  Surgeon: Laurence Spates, MD;  Location: [facility];  Service: Endoscopy;  Laterality: N/A;   MOUTH SURGERY     OVARY SURGERY     removed "something"     OB History     Gravida  0   Para  0   Term  0   Preterm  0   AB  0   Living  0      SAB  0   IAB  0   Ectopic  0   Multiple  0   Live Births  0           Family History  Problem Relation Age of Onset   Melanoma Mother    Hypertension Father    Breast cancer Maternal Aunt    Breast cancer Paternal Aunt    Diabetes Paternal Aunt    Breast cancer  Maternal Grandmother    Breast cancer Paternal Grandmother    Diabetes Paternal Grandmother    Colon cancer Neg Hx    Esophageal cancer Neg Hx    Pancreatic cancer Neg Hx    Stomach cancer Neg Hx    Liver disease Neg Hx    Rectal cancer Neg Hx     Social History   Tobacco Use   Smoking status: Never   Smokeless tobacco: Never  Vaping Use   Vaping Use: Never used  Substance Use Topics   Alcohol use: No   Drug use: No    Home Medications Prior to Admission medications   Medication Sig Start Date End Date Taking? Authorizing Provider  Acetaminophen 500 MG capsule Take 1,000 mg by mouth every 6 (six) hours as needed for fever or pain.    [provider]  benztropine (COGENTIN) 1 MG tablet Take 1 tablet (1 mg total) by mouth 2 (two) times daily for EPS Patient not taking: Reported on 12/17/2020 11/11/20     COLLAGEN PO Take 1 tablet by mouth daily.    [provider]  diphenhydrAMINE (BENADRYL) 50 MG tablet Take 0.5 tablets (25 mg total) by mouth every 6 (six) hours as needed for itching. 01/09/21   Fransico Meadow, PA-C  fluticasone (FLONASE) 50 MCG/ACT nasal spray Place 2 sprays into both nostrils daily. 10/25/20   Montine Circle, PA-C  haloperidol (HALDOL) 10 MG tablet Take 1 tablet (10 mg total) by mouth 2 (two) times daily for psychosis Patient not taking: Reported on 12/17/2020 11/11/20     Lactobacillus (PROBIOTIC ACIDOPHILUS PO) Take 2 Doses by mouth daily.  10/04/19   [provider]  loratadine (CLARITIN) 10 MG tablet Take 10 mg by mouth daily.    [provider]  Milk Thistle 175 MG CAPS Take 175 mg by mouth daily. 10/04/19   [provider]  Multiple Vitamin (MULTIVITAMIN WITH MINERALS) TABS tablet Take 1 tablet by mouth daily.    [provider]  mupirocin ointment (BACTROBAN) 2 % APPLY INTO THE NOSE AS DIRECTED TWICE DAILY 06/11/20 06/11/21  Wyatt Portela, MD  simethicone (MYLICON) 053 MG chewable tablet Chew 125 mg by mouth  every 6 (six) hours as needed for flatulence.    [provider]  sodium chloride (OCEAN) 0.65 % nasal spray Place 1 spray into the nose as needed for congestion.    [provider]  TURMERIC PO Take 1 tablet by mouth daily.    [provider]    Allergies    Patient has no known allergies.  Review of Systems   Review of Systems  Musculoskeletal:  Positive for arthralgias.  All other systems reviewed and are negative.  Physical Exam Updated Vital Signs BP 115/78 (BP Location: Left Arm)   Pulse 62   Temp 99 F (37.2 C)   Resp 16   SpO2 100%   Physical Exam Vitals and nursing note reviewed.  Constitutional:      Appearance: She is well-developed.  HENT:     Head: Normocephalic and atraumatic.  Eyes:     Conjunctiva/sclera: Conjunctivae normal.     Pupils: Pupils are equal, round, and reactive to light.  Cardiovascular:     Rate and Rhythm: Normal rate and regular rhythm.     Heart sounds: Normal heart sounds.  Pulmonary:     Effort: Pulmonary effort is normal.     Breath sounds: Normal breath sounds.  Abdominal:     General: Bowel sounds are normal.     Palpations: Abdomen is soft.  Musculoskeletal:        General: Normal range of motion.     Cervical back: Normal range of motion.     Comments: Small blister to dorsal aspect of left 5th digit, no signs of superimposed infection, remainder of foot exam is normal without ulcerations to bottoms of feet, DP pulses intact, feet are well perfused, ambulatory  Skin:    General: Skin is warm and dry.  Neurological:     Mental Status: She is alert and oriented to person, place, and time.    ED Results / Procedures / Treatments   Labs (all labs ordered are listed, but only abnormal results are displayed) Labs Reviewed - No data to display  EKG None  Radiology No results found.  Procedures Procedures   Medications Ordered in ED Medications - No data to display  ED Course  I have  reviewed the triage vital signs and the nursing notes.  Pertinent labs & imaging results that were available during my care of the patient were reviewed  by me and considered in my medical decision making (see chart for details).    MDM Rules/Calculators/A&P  50 y.o. F here with bilateral foot pain.  Seen for same yesterday.  Apparently got caught in the rain, shoes/sock got wet.  She is currently homeless.  Does have small blister noted to dorsal left 5th toe without signs of superimposed infection or cellulitis.  No other acute/non-healing wounds noted to the feet.  She was given clean/dry socks here.  Do not feel further work-up indicated.  Stable for discharge with PCP follow-up.  Return here for new concerns.  Final Clinical Impression(s) / ED Diagnoses Final diagnoses:  Bilateral foot pain    Rx / DC Orders ED Discharge Orders     None        Larene Pickett, PA-C 01/15/21 0236    Davonna Belling, MD 01/15/21 [phone]

## 2021-01-15 NOTE — ED Triage Notes (Addendum)
Patient said she is having memory loss after being out on the streets for 2 weeks walking around, has not slept, ate much, drank much. Patient said her feet hurt. Denies falls, shortness of breath, chest pain.

## 2021-01-15 NOTE — ED Notes (Signed)
Patient keeps coming in to the ED and leaving. Called name for triage

## 2021-01-15 NOTE — ED Triage Notes (Signed)
Pt reporting bilateral foot pain for the past week. Pt reports she has been walking a lot so her feet hurt. Pt initially called ems because she was wet from the rain. Pt is ambulatory and ems vss. Pt has been coming in and out of ED.

## 2021-01-15 NOTE — ED Notes (Addendum)
Pt has now returned to the ED. Informed pt that she needs to stay in the waiting area.

## 2021-01-15 NOTE — ED Notes (Signed)
Pt left ED once again. This NT requested that pt stay in the waiting room due to not being triaged yet. Pt stated that she would be back and kept walking.

## 2021-01-16 LAB — COMPREHENSIVE METABOLIC PANEL
ALT: 20 U/L (ref 0–44)
AST: 51 U/L — ABNORMAL HIGH (ref 15–41)
Albumin: 4.6 g/dL (ref 3.5–5.0)
Alkaline Phosphatase: 71 U/L (ref 38–126)
Anion gap: 11 (ref 5–15)
BUN: 17 mg/dL (ref 6–20)
CO2: 23 mmol/L (ref 22–32)
Calcium: 9.4 mg/dL (ref 8.9–10.3)
Chloride: 101 mmol/L (ref 98–111)
Creatinine, Ser: 0.54 mg/dL (ref 0.44–1.00)
GFR, Estimated: >60 mL/min (ref >60)
Glucose, Bld: 75 mg/dL (ref 70–99)
Potassium: 3.2 mmol/L — ABNORMAL LOW (ref 3.5–5.1)
Sodium: 135 mmol/L (ref 135–145)
Total Bilirubin: 1.1 mg/dL (ref 0.3–1.2)
Total Protein: 7.5 g/dL (ref 6.5–8.1)

## 2021-01-16 LAB — CBC WITH DIFFERENTIAL/PLATELET
Abs Immature Granulocytes: 0.01 10*3/uL (ref 0.00–0.07)
Basophils Absolute: 0 10*3/uL (ref 0.0–0.1)
Basophils Relative: 0 %
Eosinophils Absolute: 0 10*3/uL (ref 0.0–0.5)
Eosinophils Relative: 0 %
HCT: 40.2 % (ref 36.0–46.0)
Hemoglobin: 12.5 g/dL (ref 12.0–15.0)
Immature Granulocytes: 0 %
Lymphocytes Relative: 31 %
Lymphs Abs: 1.4 10*3/uL (ref 0.7–4.0)
MCH: 28 pg (ref 26.0–34.0)
MCHC: 31.1 g/dL (ref 30.0–36.0)
MCV: 90.1 fL (ref 80.0–100.0)
Monocytes Absolute: 0.5 10*3/uL (ref 0.1–1.0)
Monocytes Relative: 11 %
Neutro Abs: 2.7 10*3/uL (ref 1.7–7.7)
Neutrophils Relative %: 58 %
Platelets: 57 10*3/uL — ABNORMAL LOW (ref 150–400)
RBC: 4.46 MIL/uL (ref 3.87–5.11)
RDW: 12.4 % (ref 11.5–15.5)
WBC: 4.6 10*3/uL (ref 4.0–10.5)
nRBC: 0 % (ref 0.0–0.2)

## 2021-01-16 LAB — CK: Total CK: 741 U/L — ABNORMAL HIGH (ref 38–234)

## 2021-01-16 NOTE — Discharge Instructions (Addendum)
Please be sure to use the provided resources to obtain outpatient services including shelters and social services.  Stay well-hydrated, do not hesitate to return for concerning changes in your condition.

## 2021-01-16 NOTE — ED Notes (Signed)
Pt called for vitals pt did not answer

## 2021-01-16 NOTE — ED Provider Notes (Signed)
[facility]EMERGENCY DEPT Provider Note   CSN: [id number] Arrival date & time: 01/15/21  2328     History Chief Complaint  Patient presents with   Fatigue   Foot Pain    Kaitlyn Duncan is a 50 y.o. female.  HPI Patient is homeless, notes that after spending the 2 prior weeks walking she has foot pain.  Patient is a tangential historian, but perseverates on foot soreness, and requests a place to sleep, something to drink and eat.  When asked about services, she states that she prefers not to go to some of the local homeless facilities, states that she will get resources from clergy, and churchgoer's.  She cannot specify which clergy, or church. Patient denies recent fall, trauma.  Chart review notable for evaluation yesterday for foot pain, after the patient was apparently caught in the rain, had her socks get wet.    Past Medical History:  Diagnosis Date   Allergy    Anemia    Anemia    Diabetes mellitus without complication ([facility])    prediabetic   GERD (gastroesophageal reflux disease)    melanoma with met dz dx'd 01/2018   brain and adrenal   Seasonal allergies     Patient Active Problem List   Diagnosis Date Noted   Psychosis ([facility])    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   Melanoma of skin ([facility]) 05/29/2018   Goals of care, counseling/discussion 05/29/2018   Brain metastasis ([facility]) 01/26/2018    Past Surgical History:  Procedure Laterality Date   BIOPSY  01/28/2018   Procedure: BIOPSY;  Surgeon: Carman Ching, MD;  Location: [facility];  Service: Endoscopy;;   BREAST BIOPSY Left    ESOPHAGOGASTRODUODENOSCOPY N/A 01/28/2018   Procedure: ESOPHAGOGASTRODUODENOSCOPY (EGD);  Surgeon: Carman Ching, MD;  Location: [facility];  Service: Endoscopy;  Laterality: N/A;   MOUTH SURGERY     OVARY SURGERY     removed "something"     OB History     Gravida  0   Para  0   Term  0   Preterm  0    AB  0   Living  0      SAB  0   IAB  0   Ectopic  0   Multiple  0   Live Births  0           Family History  Problem Relation Age of Onset   Melanoma Mother    Hypertension Father    Breast cancer Maternal Aunt    Breast cancer Paternal Aunt    Diabetes Paternal Aunt    Breast cancer Maternal Grandmother    Breast cancer Paternal Grandmother    Diabetes Paternal Grandmother    Colon cancer Neg Hx    Esophageal cancer Neg Hx    Pancreatic cancer Neg Hx    Stomach cancer Neg Hx    Liver disease Neg Hx    Rectal cancer Neg Hx     Social History   Tobacco Use   Smoking status: Never   Smokeless tobacco: Never  Vaping Use   Vaping Use: Never used  Substance Use Topics   Alcohol use: No   Drug use: No    Home Medications Prior to Admission medications   Medication Sig Start Date End Date Taking? Authorizing Provider  Acetaminophen 500 MG capsule Take 1,000 mg by mouth every 6 (six) hours as needed for fever or pain.  [provider]  benztropine (COGENTIN) 1 MG tablet Take 1 tablet (1 mg total) by mouth 2 (two) times daily for EPS Patient not taking: Reported on 12/17/2020 11/11/20     COLLAGEN PO Take 1 tablet by mouth daily.    [provider]  diphenhydrAMINE (BENADRYL) 50 MG tablet Take 0.5 tablets (25 mg total) by mouth every 6 (six) hours as needed for itching. 01/09/21   Fransico Meadow, PA-C  fluticasone (FLONASE) 50 MCG/ACT nasal spray Place 2 sprays into both nostrils daily. 10/25/20   Montine Circle, PA-C  haloperidol (HALDOL) 10 MG tablet Take 1 tablet (10 mg total) by mouth 2 (two) times daily for psychosis Patient not taking: Reported on 12/17/2020 11/11/20     Lactobacillus (PROBIOTIC ACIDOPHILUS PO) Take 2 Doses by mouth daily.  10/04/19   [provider]  loratadine (CLARITIN) 10 MG tablet Take 10 mg by mouth daily.    [provider]  Milk Thistle 175 MG CAPS Take 175 mg by mouth daily. 10/04/19   [provider]  Multiple Vitamin (MULTIVITAMIN WITH MINERALS) TABS tablet Take 1 tablet by mouth daily.    [provider]  mupirocin ointment (BACTROBAN) 2 % APPLY INTO THE NOSE AS DIRECTED TWICE DAILY 06/11/20 06/11/21  Wyatt Portela, MD  simethicone (MYLICON) 614 MG chewable tablet Chew 125 mg by mouth every 6 (six) hours as needed for flatulence.    [provider]  sodium chloride (OCEAN) 0.65 % nasal spray Place 1 spray into the nose as needed for congestion.    [provider]  TURMERIC PO Take 1 tablet by mouth daily.    [provider]    Allergies    Patient has no known allergies.  Review of Systems   Review of Systems  Constitutional:        Per HPI, otherwise negative  HENT:         Per HPI, otherwise negative  Respiratory:         Per HPI, otherwise negative  Cardiovascular:        Per HPI, otherwise negative  Gastrointestinal:  Negative for vomiting.  Endocrine:       Negative aside from HPI  Genitourinary:        Neg aside from HPI   Musculoskeletal:        Per HPI, otherwise negative  Skin: Negative.  Negative for wound.  Neurological:  Negative for syncope.   Physical Exam Updated Vital Signs BP 111/62 (BP Location: Right Arm)   Pulse 62   Temp 97.8 F (36.6 C) (Oral)   Resp 14   Ht $R'5\' 1"'WD$  (1.549 m)   Wt 45.4 kg   SpO2 100%   BMI 18.89 kg/m   Physical Exam Vitals and nursing note reviewed.  Constitutional:      General: She is not in acute distress.    Appearance: She is well-developed.  HENT:     Head: Normocephalic and atraumatic.  Eyes:     Conjunctiva/sclera: Conjunctivae normal.  Cardiovascular:     Rate and Rhythm: Normal rate and regular rhythm.     Pulses: Normal pulses.  Pulmonary:     Effort: Pulmonary effort is normal. No respiratory distress.     Breath sounds: No stridor.  Abdominal:     General: There is no distension.  Musculoskeletal:     Comments: Both feet examined, no open wounds, no  substantial pallor, cap refill is appropriate, pulses are appropriate. Patient moves  all extremity spontaneously, has no deformities.  Skin:    General: Skin is warm and dry.  Neurological:     Mental Status: She is alert and oriented to person, place, and time.     Cranial Nerves: No cranial nerve deficit.  Psychiatric:     Comments: Patient perseverates on wanting some place to sleep, something to eat.  Patient is irritable, perseverant on obtaining resources from church and clearly.    ED Results / Procedures / Treatments   Labs (all labs ordered are listed, but only abnormal results are displayed) Labs Reviewed  CBC WITH DIFFERENTIAL/PLATELET - Abnormal; Notable for the following components:      Result Value   Platelets 57 (*)    All other components within normal limits  COMPREHENSIVE METABOLIC PANEL - Abnormal; Notable for the following components:   Potassium 3.2 (*)    AST 51 (*)    All other components within normal limits  CK - Abnormal; Notable for the following components:   Total CK 741 (*)    All other components within normal limits    Procedures Procedures   Medications Ordered in ED Medications - No data to display  ED Course  I have reviewed the triage vital signs and the nursing notes.  Pertinent labs & imaging results that were available during my care of the patient were reviewed by me and considered in my medical decision making (see chart for details).  Adult female, homeless, with frequent ED presentations presents with bilateral foot pain.  Here she is awake, alert, hemodynamically unremarkable, tolerant of oral resuscitation, has no evidence for acute findings.  Patient provided resources discharged. Final Clinical Impression(s) / ED Diagnoses Final diagnoses:  Pain in both feet  Homelessness  Dehydration     Carmin Muskrat, MD 01/16/21 [phone]

## 2021-01-18 ENCOUNTER — Emergency Department (HOSPITAL_COMMUNITY)
Admission: EM | Admit: 2021-01-18 | Discharge: 2021-01-19 | Disposition: A | Discharge status: Home / Self Care | Payer: Medicare Other | Attending: Emergency Medicine | Admitting: Emergency Medicine

## 2021-01-18 ENCOUNTER — Other Ambulatory Visit: Payer: Self-pay

## 2021-01-18 ENCOUNTER — Encounter (HOSPITAL_COMMUNITY): Payer: Self-pay | Admitting: Emergency Medicine

## 2021-01-18 DIAGNOSIS — Z85820 Personal history of malignant melanoma of skin: Secondary | ICD-10-CM | POA: Diagnosis not present

## 2021-01-18 DIAGNOSIS — M79672 Pain in left foot: Secondary | ICD-10-CM | POA: Insufficient documentation

## 2021-01-18 DIAGNOSIS — M79671 Pain in right foot: Secondary | ICD-10-CM | POA: Insufficient documentation

## 2021-01-18 DIAGNOSIS — E119 Type 2 diabetes mellitus without complications: Secondary | ICD-10-CM | POA: Diagnosis not present

## 2021-01-18 DIAGNOSIS — L304 Erythema intertrigo: Secondary | ICD-10-CM | POA: Insufficient documentation

## 2021-01-18 DIAGNOSIS — M549 Dorsalgia, unspecified: Secondary | ICD-10-CM | POA: Insufficient documentation

## 2021-01-18 NOTE — ED Triage Notes (Signed)
Patient presents with multiple complaints: Constipation for several days , upper back pain , feet pain with blisters from walking and urinary discomfort . She is homeless , denies fever or chills , ambulatory/respirations unlabored.

## 2021-01-19 ENCOUNTER — Encounter: Payer: Self-pay | Admitting: *Deleted

## 2021-01-19 DIAGNOSIS — M79671 Pain in right foot: Secondary | ICD-10-CM | POA: Diagnosis not present

## 2021-01-19 MED ORDER — MICONAZOLE NITRATE 2 % EX CREA
TOPICAL_CREAM | Freq: Once | CUTANEOUS | Status: AC
  Filled 2021-01-19: qty 28.4

## 2021-01-19 MED ORDER — DIMETHICONE 1 % EX CREA
1.0000 "application " | TOPICAL_CREAM | Freq: Two times a day (BID) | CUTANEOUS | Status: DC | PRN
  Filled 2021-01-19: qty 113

## 2021-01-19 NOTE — ED Provider Notes (Signed)
[facility] EMERGENCY DEPARTMENT Provider Note   CSN: [id number] Arrival date & time: 01/18/21  2241     History Chief Complaint  Patient presents with   Back Pain    Kaitlyn Duncan is a 50 y.o. female with a history of melanoma, anemia, diabetes mellitus, GERD who presents to the emergency department with a chief complaint of foot pain.  Although the triage note states that the patient is here for back pain, the patient states that she is here for by lateral foot pain.  Reports that she walks for long distances of time.  No recent falls or injuries.  She also reports that she was feeling more constipated about 3 weeks ago and asked for a bottle of water.  States that she was able to have a bowel movement, but states that after that happened that "her hole was bigger".  She states that she would like to have this evaluated.  She no longer feels constipated.  She denies dysuria, hematuria, urinary frequency or hesitancy, fever, chills, shortness of breath, chest pain, vaginal bleeding or discharge,  The history is provided by medical records and the patient. No language interpreter was used.      Past Medical History:  Diagnosis Date   Allergy    Anemia    Anemia    Diabetes mellitus without complication ([place])    prediabetic   GERD (gastroesophageal reflux disease)    melanoma with met dz dx'd 01/2018   brain and adrenal   Seasonal allergies     Patient Active Problem List   Diagnosis Date Noted   Psychosis ([place])    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   Melanoma of skin ([place]) 05/29/2018   Goals of care, counseling/discussion 05/29/2018   Brain metastasis ([place]) 01/26/2018    Past Surgical History:  Procedure Laterality Date   BIOPSY  01/28/2018   Procedure: BIOPSY;  Surgeon: Laurence Spates, MD;  Location: [facility] ENDOSCOPY;  Service: Endoscopy;;   BREAST BIOPSY Left    ESOPHAGOGASTRODUODENOSCOPY N/A 01/28/2018    Procedure: ESOPHAGOGASTRODUODENOSCOPY (EGD);  Surgeon: Laurence Spates, MD;  Location: [facility];  Service: Endoscopy;  Laterality: N/A;   MOUTH SURGERY     OVARY SURGERY     removed "something"     OB History     Gravida  0   Para  0   Term  0   Preterm  0   AB  0   Living  0      SAB  0   IAB  0   Ectopic  0   Multiple  0   Live Births  0           Family History  Problem Relation Age of Onset   Melanoma Mother    Hypertension Father    Breast cancer Maternal Aunt    Breast cancer Paternal Aunt    Diabetes Paternal Aunt    Breast cancer Maternal Grandmother    Breast cancer Paternal Grandmother    Diabetes Paternal Grandmother    Colon cancer Neg Hx    Esophageal cancer Neg Hx    Pancreatic cancer Neg Hx    Stomach cancer Neg Hx    Liver disease Neg Hx    Rectal cancer Neg Hx     Social History   Tobacco Use   Smoking status: Never   Smokeless tobacco: Never  Vaping Use   Vaping Use: Never used  Substance Use  Topics   Alcohol use: No   Drug use: No    Home Medications Prior to Admission medications   Medication Sig Start Date End Date Taking? Authorizing Provider  Acetaminophen 500 MG capsule Take 1,000 mg by mouth every 6 (six) hours as needed for fever or pain.    [provider]  benztropine (COGENTIN) 1 MG tablet Take 1 tablet (1 mg total) by mouth 2 (two) times daily for EPS Patient not taking: Reported on 12/17/2020 11/11/20     COLLAGEN PO Take 1 tablet by mouth daily.    [provider]  diphenhydrAMINE (BENADRYL) 50 MG tablet Take 0.5 tablets (25 mg total) by mouth every 6 (six) hours as needed for itching. 01/09/21   Fransico Meadow, PA-C  fluticasone (FLONASE) 50 MCG/ACT nasal spray Place 2 sprays into both nostrils daily. 10/25/20   Montine Circle, PA-C  haloperidol (HALDOL) 10 MG tablet Take 1 tablet (10 mg total) by mouth 2 (two) times daily for psychosis Patient not taking: Reported on 12/17/2020 11/11/20      Lactobacillus (PROBIOTIC ACIDOPHILUS PO) Take 2 Doses by mouth daily.  10/04/19   [provider]  loratadine (CLARITIN) 10 MG tablet Take 10 mg by mouth daily.    [provider]  Milk Thistle 175 MG CAPS Take 175 mg by mouth daily. 10/04/19   [provider]  Multiple Vitamin (MULTIVITAMIN WITH MINERALS) TABS tablet Take 1 tablet by mouth daily.    [provider]  mupirocin ointment (BACTROBAN) 2 % APPLY INTO THE NOSE AS DIRECTED TWICE DAILY 06/11/20 06/11/21  Wyatt Portela, MD  simethicone (MYLICON) 093 MG chewable tablet Chew 125 mg by mouth every 6 (six) hours as needed for flatulence.    [provider]  sodium chloride (OCEAN) 0.65 % nasal spray Place 1 spray into the nose as needed for congestion.    [provider]  TURMERIC PO Take 1 tablet by mouth daily.    [provider]    Allergies    Patient has no known allergies.  Review of Systems   Review of Systems  Constitutional:  Negative for activity change, chills and fever.  HENT:  Negative for congestion and sore throat.   Eyes:  Negative for visual disturbance.  Respiratory:  Negative for shortness of breath and wheezing.   Cardiovascular:  Negative for chest pain, palpitations and leg swelling.  Gastrointestinal:  Positive for constipation. Negative for abdominal pain, blood in stool, nausea and vomiting.  Genitourinary:  Negative for dysuria.  Musculoskeletal:  Positive for arthralgias and myalgias. Negative for back pain, gait problem and neck pain.  Skin:  Negative for rash.  Allergic/Immunologic: Negative for immunocompromised state.  Neurological:  Negative for seizures, syncope, weakness, numbness and headaches.  Psychiatric/Behavioral:  Negative for confusion.    Physical Exam Updated Vital Signs BP 125/71   Pulse 80   Temp 98.4 F (36.9 C) (Oral)   Resp 14   SpO2 98%   Physical Exam Vitals and nursing note reviewed.  Constitutional:       General: She is not in acute distress.    Appearance: She is not ill-appearing, toxic-appearing or diaphoretic.  HENT:     Head: Normocephalic.     Mouth/Throat:     Mouth: Mucous membranes are moist.     Pharynx: No oropharyngeal exudate or posterior oropharyngeal erythema.     Comments: Mucous membranes are moist.  Eyes:     Conjunctiva/sclera: Conjunctivae normal.  Cardiovascular:  Rate and Rhythm: Normal rate and regular rhythm.     Pulses: Normal pulses.     Heart sounds: Normal heart sounds. No murmur heard.   No friction rub. No gallop.  Pulmonary:     Effort: Pulmonary effort is normal. No respiratory distress.     Breath sounds: No stridor. No wheezing, rhonchi or rales.  Chest:     Chest wall: No tenderness.  Abdominal:     General: There is no distension.     Palpations: Abdomen is soft. There is no mass.     Tenderness: There is no abdominal tenderness. There is no right CVA tenderness, left CVA tenderness, guarding or rebound.     Hernia: No hernia is present.  Genitourinary:    Comments: Chaperoned exam Skin in the groin is erythematous with satellite lesions. No fluctuance, red streaking, or drainage. Normal rectal exam. No fissures or hemorrhoids.  Musculoskeletal:     Cervical back: Neck supple.     Right lower leg: No edema.     Left lower leg: No edema.     Comments: No ulcerations noted to the bilateral feet. NVI. No redness, swelling, or warmth.   Skin:    General: Skin is warm.     Capillary Refill: Capillary refill takes less than 2 seconds.     Coloration: Skin is not jaundiced or pale.     Findings: No rash.  Neurological:     Mental Status: She is alert.  Psychiatric:        Behavior: Behavior normal.    ED Results / Procedures / Treatments   Labs (all labs ordered are listed, but only abnormal results are displayed) Labs Reviewed - No data to display  EKG None  Radiology No results found.  Procedures Procedures   Medications  Ordered in ED Medications - No data to display  ED Course  I have reviewed the triage vital signs and the nursing notes.  Pertinent labs & imaging results that were available during my care of the patient were reviewed by me and considered in my medical decision making (see chart for details).    MDM Rules/Calculators/A&P                          50 year old female with a history of melanoma, anemia, diabetes mellitus, GERD who is well known to this ER with 16 visits in the last 6 months. She presents with multiple chronic complaints, including bilateral foot pain. She endorsed back pain to triage RN, but denies this complaint to me. Foot exam is reassuring and unchanged from when she was evaluated for the same on 7/15. She does not appear dehydrated. Abdominal exam with hyperactive bowel signs, but otherwise unremarkable. She does not have a surgical abdomen. She does have intertrigo in the groin. Will provide barrier cream and miconazole ointment. She can follow up with primary care. Doubt bowel obstruction, cellulitis, or dehydration.  At this time, I do not feel there is any life-threatening condition present. I have reviewed and discussed all results (EKG, imaging, lab, urine as appropriate) and exam findings with patient/family. I have reviewed nursing notes and appropriate previous records.  I feel the patient is safe to be discharged home without further emergent workup and can continue workup as an outpatient as needed. Discussed usual and customary return precautions. Patient/family verbalize understanding and are comfortable with this plan.  Outpatient follow-up has been provided. All questions have been answered.  Final Clinical Impression(s) / ED Diagnoses Final diagnoses:  None    Rx / DC Orders ED Discharge Orders     None        Joanne Gavel, PA-C 01/19/21 0728    Fatima Blank, MD 01/19/21 2104

## 2021-01-19 NOTE — ED Notes (Signed)
Pt given meal bag and drink. Explained discharge information. Pt uncooperative with information provided and stated we were not giving proper care and that she was homeless and couldn't go back out on the street. Educated pt of the time and shelters she could possible go visit today. Pt also given socks. Security called to escort pt out of ED.

## 2021-01-19 NOTE — Discharge Instructions (Addendum)
Thank you for allowing me to care for you today in the Emergency Department.   You have irritation and a rash on the groin.  This can be from too much moisture in the area.  Make sure to clean the groin daily with soap and water.   Pat the area dry or gently use a hair dryer on a cool setting to dry the area.   You can apply the barrier cream to the skin to help protect from the irritation of urine or stool.  Apply a thin layer of miconazole cream to the rash 2 times daily until it resolves.  Do not insert this inside the vagina or the rectum.  It can only be used on the outside skin.  Follow-up with your primary care provider for recheck of your symptoms.  Return to the emergency department if you start having severe swelling, redness, mucus-like drainage, fever, temperature greater than 100.4 F, significantly worsening pain, or other new, concerning symptoms.
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
## 2021-01-20 ENCOUNTER — Telehealth: Payer: Self-pay | Admitting: *Deleted

## 2021-01-20 ENCOUNTER — Ambulatory Visit (HOSPITAL_COMMUNITY): Payer: Medicare Other

## 2021-01-20 ENCOUNTER — Inpatient Hospital Stay: Payer: Medicaid Other

## 2021-01-20 ENCOUNTER — Other Ambulatory Visit: Payer: Medicare Other

## 2021-01-20 NOTE — Telephone Encounter (Signed)
VM left from this patient, stating she wants to cancel today's appointment for lab & CT, also her appointment with Dr. Alen Blew on 01/26/21.  Attempted to call patient x 2, message states the call cannot be completed at this time.

## 2021-01-23 ENCOUNTER — Encounter (HOSPITAL_COMMUNITY): Payer: Self-pay

## 2021-01-23 ENCOUNTER — Ambulatory Visit (HOSPITAL_COMMUNITY)
Admission: EM | Admit: 2021-01-23 | Discharge: 2021-01-23 | Disposition: A | Discharge status: Home / Self Care | Payer: Medicare Other | Attending: Emergency Medicine | Admitting: Emergency Medicine

## 2021-01-23 ENCOUNTER — Other Ambulatory Visit: Payer: Self-pay

## 2021-01-23 DIAGNOSIS — M7918 Myalgia, other site: Secondary | ICD-10-CM | POA: Diagnosis not present

## 2021-01-23 DIAGNOSIS — B372 Candidiasis of skin and nail: Secondary | ICD-10-CM

## 2021-01-23 DIAGNOSIS — Z765 Malingerer [conscious simulation]: Secondary | ICD-10-CM | POA: Diagnosis not present

## 2021-01-23 LAB — POCT URINALYSIS DIPSTICK, ED / UC
Bilirubin Urine: NEGATIVE
Glucose, UA: NEGATIVE mg/dL
Hgb urine dipstick: NEGATIVE
Ketones, ur: NEGATIVE mg/dL
Leukocytes,Ua: NEGATIVE
Nitrite: NEGATIVE
Protein, ur: NEGATIVE mg/dL
Specific Gravity, Urine: 1.015 (ref 1.005–1.030)
Urobilinogen, UA: 0.2 mg/dL (ref 0.0–1.0)
pH: 6.5 (ref 5.0–8.0)

## 2021-01-23 MED ORDER — CLOTRIMAZOLE 1 % EX CREA: TOPICAL_CREAM | CUTANEOUS | 0 refills | Status: DC

## 2021-01-23 NOTE — ED Triage Notes (Signed)
Pt wants to be check for UTI as she states she has to hold the urine as she do not have a lot of options to urinate, not abel to drink, as she is sleeping in the streets x 3 weeks.   Pt reports blister in the left leg x 1 week; bilateral leg and foot  pain x 3 days.

## 2021-01-23 NOTE — ED Provider Notes (Signed)
[facility]    CSN: [id number] Arrival date & time: 01/23/21  1517      History   Chief Complaint No chief complaint on file.   HPI Kaitlyn Duncan is a 50 y.o. female.   50 year old female presents to urgent care complaining of wanting to be checked for UTI as she is homeless and does not have bathroom options.  Patient also complained of bilateral foot pain.  Patient requesting food tray and bottled water.  Patient offered water and Gatorade patient refused states its not her brand.   Reviewed medical records patient has had over 10 ER visits this month for similar type complaints.   The history is provided by the patient. No language interpreter was used.   Past Medical History:  Diagnosis Date   Allergy    Anemia    Anemia    Diabetes mellitus without complication ([place])    prediabetic   GERD (gastroesophageal reflux disease)    melanoma with met dz dx'd 01/2018   brain and adrenal   Seasonal allergies     Patient Active Problem List   Diagnosis Date Noted   Malingering 01/23/2021   Candidiasis of skin 01/23/2021   Musculoskeletal pain 01/23/2021   Psychosis ([place])    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   Melanoma of skin ([place]) 05/29/2018   Goals of care, counseling/discussion 05/29/2018   Brain metastasis ([place]) 01/26/2018    Past Surgical History:  Procedure Laterality Date   BIOPSY  01/28/2018   Procedure: BIOPSY;  Surgeon: Laurence Spates, MD;  Location: [facility];  Service: Endoscopy;;   BREAST BIOPSY Left    ESOPHAGOGASTRODUODENOSCOPY N/A 01/28/2018   Procedure: ESOPHAGOGASTRODUODENOSCOPY (EGD);  Surgeon: Laurence Spates, MD;  Location: [facility];  Service: Endoscopy;  Laterality: N/A;   MOUTH SURGERY     OVARY SURGERY     removed "something"    OB History     Gravida  0   Para  0   Term  0   Preterm  0   AB  0   Living  0      SAB  0   IAB  0   Ectopic  0   Multiple   0   Live Births  0            Home Medications    Prior to Admission medications   Medication Sig Start Date End Date Taking? Authorizing Provider  clotrimazole (LOTRIMIN) 1 % cream Apply to affected area 2 times daily 3/71/69  Yes Makhiya Coburn, Jeanett Schlein, NP  Acetaminophen 500 MG capsule Take 1,000 mg by mouth every 6 (six) hours as needed for fever or pain.    [provider]  benztropine (COGENTIN) 1 MG tablet Take 1 tablet (1 mg total) by mouth 2 (two) times daily for EPS Patient not taking: No sig reported 11/11/20     COLLAGEN PO Take 1 tablet by mouth daily.    [provider]  diphenhydrAMINE (BENADRYL) 50 MG tablet Take 0.5 tablets (25 mg total) by mouth every 6 (six) hours as needed for itching. 01/09/21   Fransico Meadow, PA-C  fluticasone (FLONASE) 50 MCG/ACT nasal spray Place 2 sprays into both nostrils daily. 10/25/20   Montine Circle, PA-C  haloperidol (HALDOL) 10 MG tablet Take 1 tablet (10 mg total) by mouth 2 (two) times daily for psychosis Patient not taking: No sig reported 11/11/20     Lactobacillus (PROBIOTIC ACIDOPHILUS PO) Take 2  Doses by mouth daily.  10/04/19   [provider]  loratadine (CLARITIN) 10 MG tablet Take 10 mg by mouth daily.    [provider]  Milk Thistle 175 MG CAPS Take 175 mg by mouth daily. 10/04/19   [provider]  Multiple Vitamin (MULTIVITAMIN WITH MINERALS) TABS tablet Take 1 tablet by mouth daily.    [provider]  mupirocin ointment (BACTROBAN) 2 % APPLY INTO THE NOSE AS DIRECTED TWICE DAILY 06/11/20 06/11/21  Wyatt Portela, MD  simethicone (MYLICON) 325 MG chewable tablet Chew 125 mg by mouth every 6 (six) hours as needed for flatulence.    [provider]  sodium chloride (OCEAN) 0.65 % nasal spray Place 1 spray into the nose as needed for congestion.    [provider]  TURMERIC PO Take 1 tablet by mouth daily.    [provider]    Family History Family  History  Problem Relation Age of Onset   Melanoma Mother    Hypertension Father    Breast cancer Maternal Aunt    Breast cancer Paternal Aunt    Diabetes Paternal Aunt    Breast cancer Maternal Grandmother    Breast cancer Paternal Grandmother    Diabetes Paternal Grandmother    Colon cancer Neg Hx    Esophageal cancer Neg Hx    Pancreatic cancer Neg Hx    Stomach cancer Neg Hx    Liver disease Neg Hx    Rectal cancer Neg Hx     Social History Social History   Tobacco Use   Smoking status: Never   Smokeless tobacco: Never  Vaping Use   Vaping Use: Never used  Substance Use Topics   Alcohol use: No   Drug use: No     Allergies   Patient has no known allergies.   Review of Systems Review of Systems  Musculoskeletal:  Positive for myalgias.  Skin:  Positive for rash.  All other systems reviewed and are negative.   Physical Exam Triage Vital Signs ED Triage Vitals [01/23/21 1655]  Enc Vitals Group     BP      Pulse      Resp      Temp      Temp src      SpO2      Weight      Height      Head Circumference      Peak Flow      Pain Score 8     Pain Loc      Pain Edu?      Excl. in [place]?    No data found.  Updated Vital Signs BP 125/78 (BP Location: Left Arm)   Pulse 63   Temp 98.5 F (36.9 C) (Oral)   Resp 18   SpO2 100%   Visual Acuity Right Eye Distance:   Left Eye Distance:   Bilateral Distance:    Right Eye Near:   Left Eye Near:    Bilateral Near:     Physical Exam Vitals and nursing note reviewed.  Constitutional:      General: She is not in acute distress.    Appearance: She is well-developed.  HENT:     Head: Normocephalic and atraumatic.  Eyes:     Conjunctiva/sclera: Conjunctivae normal.  Cardiovascular:     Rate and Rhythm: Normal rate and regular rhythm.     Pulses: Normal pulses.          Popliteal  pulses are 2+ on the right side and 2+ on the left side.     Heart sounds: No murmur heard. Pulmonary:     Effort:  Pulmonary effort is normal. No respiratory distress.     Breath sounds: Normal breath sounds and air entry.  Abdominal:     Palpations: Abdomen is soft.     Tenderness: There is no abdominal tenderness.  Musculoskeletal:     Cervical back: Neck supple.  Feet:     Comments: No blisters noted to feet Skin:    General: Skin is warm and dry.     Findings: Rash present.       Neurological:     Mental Status: She is alert.  Psychiatric:        Attention and Perception: Attention normal.        Mood and Affect: Mood normal.        Speech: Speech normal.     UC Treatments / Results  Labs (all labs ordered are listed, but only abnormal results are displayed) Labs Reviewed  POCT URINALYSIS DIPSTICK, ED / UC    EKG   Radiology No results found.  Procedures Procedures (including critical care time)  Medications Ordered in UC Medications - No data to display  Initial Impression / Assessment and Plan / UC Course  I have reviewed the triage vital signs and the nursing notes.  Pertinent labs & imaging results that were available during my care of the patient were reviewed by me and considered in my medical decision making (see chart for details).     Ddx: Homelessness, skin yeast infection, musculoskeletal pain, malingering.Patient given [facility] contact information discussed at length about staying hydrated in this heat, shelters.  Patient will only take bottled water of certain brand refuses Gatorade and tap water.  Reviewed results with patient, urine was negative.  Patient verbalized understanding to this provider. Final Clinical Impressions(s) / UC Diagnoses   Final diagnoses:  Malingering  Candidiasis of skin  Musculoskeletal pain     Discharge Instructions      Please follow-up with [facility] at [place]. for assistance.  Their phone number is [phone].  Please apply Lotrimin to area.  Drink plenty of water to stay hydrated.  Follow-up  with the emergency room if you have new or worsening issues or concerns.      ED Prescriptions     Medication Sig Dispense Auth. Provider   clotrimazole (LOTRIMIN) 1 % cream Apply to affected area 2 times daily 15 g Marquette Blodgett, Jeanett Schlein, NP      PDMP not reviewed this encounter.   Tori Milks, NP 89/16/94 1932

## 2021-01-23 NOTE — Discharge Instructions (Addendum)
Please follow-up with [employer] [facility] at [place]. for assistance.  Their phone number is [phone].  Please apply Lotrimin to area.  Drink plenty of water to stay hydrated.  Follow-up with the emergency room if you have new or worsening issues or concerns.

## 2021-01-24 DIAGNOSIS — M79671 Pain in right foot: Secondary | ICD-10-CM | POA: Diagnosis not present

## 2021-01-24 DIAGNOSIS — Z5900 Homelessness unspecified: Secondary | ICD-10-CM | POA: Insufficient documentation

## 2021-01-24 DIAGNOSIS — M79672 Pain in left foot: Secondary | ICD-10-CM | POA: Diagnosis not present

## 2021-01-24 DIAGNOSIS — E119 Type 2 diabetes mellitus without complications: Secondary | ICD-10-CM | POA: Diagnosis not present

## 2021-01-24 DIAGNOSIS — Z85820 Personal history of malignant melanoma of skin: Secondary | ICD-10-CM | POA: Diagnosis not present
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
## 2021-01-25 ENCOUNTER — Encounter (HOSPITAL_COMMUNITY): Payer: Self-pay

## 2021-01-25 ENCOUNTER — Emergency Department (HOSPITAL_COMMUNITY)
Admission: EM | Admit: 2021-01-25 | Discharge: 2021-01-25 | Disposition: A | Discharge status: Home / Self Care | Payer: Medicare Other | Attending: Emergency Medicine | Admitting: Emergency Medicine

## 2021-01-25 DIAGNOSIS — M79671 Pain in right foot: Secondary | ICD-10-CM

## 2021-01-25 DIAGNOSIS — Z5900 Homelessness unspecified: Secondary | ICD-10-CM

## 2021-01-25 NOTE — ED Notes (Signed)
Pt changed into dry clothing, socks. Provided towels, a blanket, sandwich and bus pass. Given bag for wet clothing

## 2021-01-25 NOTE — ED Provider Notes (Signed)
[facility] EMERGENCY DEPARTMENT Provider Note   CSN: [id number] Arrival date & time: 01/24/21  2357     History Chief Complaint  Patient presents with   Foot Pain    Kaitlyn Duncan is a 50 y.o. female.  50 y/o female, homeless, presenting with EMS from the [facility]. Requested transport because her clothes were wet from the rainstorm. She is requesting a towel and some new clothes as well as food.   She reports ongoing bilateral foot pain and soreness x 3 weeks. Has been seen a number of times for this complaint. Has tried keeping her blisters covered. Also requesting medication for overactive bladder. States she never has any designated place to use the restroom.   The history is provided by the patient. No language interpreter was used.  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   Melanoma of skin ([place]) 05/29/2018   Goals of care, counseling/discussion 05/29/2018   Brain metastasis ([place]) 01/26/2018    Past Surgical History:  Procedure Laterality Date   BIOPSY  01/28/2018   Procedure: BIOPSY;  Surgeon: Laurence Spates, MD;  Location: [facility];  Service: Endoscopy;;   BREAST BIOPSY Left    ESOPHAGOGASTRODUODENOSCOPY N/A 01/28/2018   Procedure: ESOPHAGOGASTRODUODENOSCOPY (EGD);  Surgeon: Laurence Spates, MD;  Location: [facility];  Service: Endoscopy;  Laterality: N/A;   MOUTH SURGERY     OVARY SURGERY     removed "something"     OB History     Gravida  0   Para  0    Term  0   Preterm  0   AB  0   Living  0      SAB  0   IAB  0   Ectopic  0   Multiple  0   Live Births  0           Family History  Problem Relation Age of Onset   Melanoma Mother    Hypertension Father    Breast cancer Maternal Aunt    Breast cancer Paternal Aunt    Diabetes Paternal Aunt    Breast cancer Maternal Grandmother    Breast cancer Paternal Grandmother    Diabetes Paternal Grandmother    Colon cancer Neg Hx    Esophageal cancer Neg Hx    Pancreatic cancer Neg Hx    Stomach cancer Neg Hx    Liver disease Neg Hx    Rectal cancer Neg Hx     Social History   Tobacco Use   Smoking status: Never   Smokeless tobacco: Never  Vaping Use   Vaping Use: Never used  Substance Use Topics   Alcohol use: No   Drug use: No    Home Medications Prior to Admission medications   Medication Sig Start Date End Date Taking? Authorizing Provider  Acetaminophen 500 MG capsule Take 1,000 mg by mouth every 6 (six) hours as needed for  fever or pain.    [provider]  benztropine (COGENTIN) 1 MG tablet Take 1 tablet (1 mg total) by mouth 2 (two) times daily for EPS Patient not taking: No sig reported 11/11/20     clotrimazole (LOTRIMIN) 1 % cream Apply to affected area 2 times daily 1/57/26   Defelice, Jeanett Schlein, NP  COLLAGEN PO Take 1 tablet by mouth daily.    [provider]  diphenhydrAMINE (BENADRYL) 50 MG tablet Take 0.5 tablets (25 mg total) by mouth every 6 (six) hours as needed for itching. 01/09/21   Fransico Meadow, PA-C  fluticasone (FLONASE) 50 MCG/ACT nasal spray Place 2 sprays into both nostrils daily. 10/25/20   Montine Circle, PA-C  haloperidol (HALDOL) 10 MG tablet Take 1 tablet (10 mg total) by mouth 2 (two) times daily for psychosis Patient not taking: No sig reported 11/11/20     Lactobacillus (PROBIOTIC ACIDOPHILUS PO) Take 2 Doses by mouth daily.  10/04/19   [provider]  loratadine (CLARITIN) 10 MG tablet Take 10 mg by  mouth daily.    [provider]  Milk Thistle 175 MG CAPS Take 175 mg by mouth daily. 10/04/19   [provider]  Multiple Vitamin (MULTIVITAMIN WITH MINERALS) TABS tablet Take 1 tablet by mouth daily.    [provider]  mupirocin ointment (BACTROBAN) 2 % APPLY INTO THE NOSE AS DIRECTED TWICE DAILY 06/11/20 06/11/21  Wyatt Portela, MD  simethicone (MYLICON) 203 MG chewable tablet Chew 125 mg by mouth every 6 (six) hours as needed for flatulence.    [provider]  sodium chloride (OCEAN) 0.65 % nasal spray Place 1 spray into the nose as needed for congestion.    [provider]  TURMERIC PO Take 1 tablet by mouth daily.    [provider]    Allergies    Patient has no known allergies.  Review of Systems   Review of Systems Ten systems reviewed and are negative for acute change, except as noted in the HPI.    Physical Exam Updated Vital Signs Ht _0  (1.549 m)   Wt 46 kg   BMI 19.16 kg/m   Physical Exam Vitals and nursing note reviewed.  Constitutional:      General: She is not in acute distress.    Appearance: She is well-developed. She is not diaphoretic.     Comments: Wet hair and clothes, in NAD  HENT:     Head: Normocephalic and atraumatic.  Eyes:     General: No scleral icterus.    Conjunctiva/sclera: Conjunctivae normal.  Cardiovascular:     Rate and Rhythm: Normal rate and regular rhythm.     Pulses: Normal pulses.     Comments: 2+ DP pulse b/l Pulmonary:     Effort: Pulmonary effort is normal. No respiratory distress.     Comments: Respirations even and unlabored Musculoskeletal:        General: Normal range of motion.     Cervical back: Normal range of motion.     Comments: B/l feet are soft without erythema or edema. Few blisters noted of varying chronicity. No secondary infection or cellulitis.  Skin:    General: Skin is warm and dry.     Coloration: Skin is not pale.     Findings: No erythema or rash.   Neurological:     Mental Status: She is alert and oriented to person, place, and time.  Psychiatric:        Behavior:  Behavior normal.    ED Results / Procedures / Treatments   Labs (all labs ordered are listed, but only abnormal results are displayed) Labs Reviewed - No data to display  EKG None  Radiology No results found.  Procedures Procedures   Medications Ordered in ED Medications - No data to display  ED Course  I have reviewed the triage vital signs and the nursing notes.  Pertinent labs & imaging results that were available during my care of the patient were reviewed by me and considered in my medical decision making (see chart for details).    MDM Rules/Calculators/A&P                           Homeless requesting new dry clothes. Given towel and paper scrubs. Noting b/l foot pain which is chronic, unchanged. No secondary infection or cellulitis. Told we are unable to prescribe medications for OAB. Had UA yesterday which was negative for UTI. Stable for discharge. Resources on local shelters given.   Final Clinical Impression(s) / ED Diagnoses Final diagnoses:  Homeless  Bilateral foot pain    Rx / DC Orders ED Discharge Orders     None        Antonietta Breach, PA-C 01/25/21 Jeanell Sparrow, April, MD 01/25/21 0030

## 2021-01-26 ENCOUNTER — Ambulatory Visit: Payer: Medicare Other | Admitting: Oncology

## 2021-01-28 ENCOUNTER — Ambulatory Visit: Payer: Medicare Other | Admitting: Oncology

## 2021-02-01 ENCOUNTER — Emergency Department (HOSPITAL_COMMUNITY): Payer: Medicare Other

## 2021-02-01 ENCOUNTER — Encounter (HOSPITAL_COMMUNITY): Payer: Self-pay

## 2021-02-01 ENCOUNTER — Ambulatory Visit (HOSPITAL_COMMUNITY)
Admission: EM | Admit: 2021-02-01 | Discharge: 2021-02-01 | Disposition: A | Discharge status: Home / Self Care | Payer: Medicare Other

## 2021-02-01 ENCOUNTER — Encounter (HOSPITAL_COMMUNITY): Payer: Self-pay | Admitting: Emergency Medicine

## 2021-02-01 ENCOUNTER — Emergency Department (HOSPITAL_COMMUNITY)
Admission: EM | Admit: 2021-02-01 | Discharge: 2021-02-01 | Disposition: A | Discharge status: Home / Self Care | Payer: Medicare Other | Attending: Emergency Medicine | Admitting: Emergency Medicine

## 2021-02-01 ENCOUNTER — Emergency Department (HOSPITAL_COMMUNITY)
Admission: EM | Admit: 2021-02-01 | Discharge: 2021-02-02 | Disposition: A | Discharge status: Home / Self Care | Payer: Medicare Other | Source: Home / Self Care | Attending: Emergency Medicine | Admitting: Emergency Medicine

## 2021-02-01 ENCOUNTER — Other Ambulatory Visit: Payer: Self-pay

## 2021-02-01 DIAGNOSIS — M79671 Pain in right foot: Secondary | ICD-10-CM

## 2021-02-01 DIAGNOSIS — Z85828 Personal history of other malignant neoplasm of skin: Secondary | ICD-10-CM | POA: Diagnosis not present

## 2021-02-01 DIAGNOSIS — M79672 Pain in left foot: Secondary | ICD-10-CM | POA: Diagnosis not present

## 2021-02-01 DIAGNOSIS — E119 Type 2 diabetes mellitus without complications: Secondary | ICD-10-CM | POA: Insufficient documentation

## 2021-02-01 DIAGNOSIS — M79661 Pain in right lower leg: Secondary | ICD-10-CM | POA: Diagnosis present

## 2021-02-01 DIAGNOSIS — R5381 Other malaise: Secondary | ICD-10-CM | POA: Diagnosis not present

## 2021-02-01 DIAGNOSIS — R42 Dizziness and giddiness: Secondary | ICD-10-CM | POA: Insufficient documentation

## 2021-02-01 DIAGNOSIS — M79662 Pain in left lower leg: Secondary | ICD-10-CM

## 2021-02-01 DIAGNOSIS — B353 Tinea pedis: Secondary | ICD-10-CM | POA: Diagnosis not present

## 2021-02-01 DIAGNOSIS — Z85841 Personal history of malignant neoplasm of brain: Secondary | ICD-10-CM | POA: Diagnosis not present

## 2021-02-01 DIAGNOSIS — Z5900 Homelessness unspecified: Secondary | ICD-10-CM | POA: Insufficient documentation

## 2021-02-01 DIAGNOSIS — G629 Polyneuropathy, unspecified: Secondary | ICD-10-CM | POA: Diagnosis not present

## 2021-02-01 LAB — CBC WITH DIFFERENTIAL/PLATELET
Abs Immature Granulocytes: 0.01 10*3/uL (ref 0.00–0.07)
Basophils Absolute: 0 10*3/uL (ref 0.0–0.1)
Basophils Relative: 0 %
Eosinophils Absolute: 0 10*3/uL (ref 0.0–0.5)
Eosinophils Relative: 0 %
HCT: 38.6 % (ref 36.0–46.0)
Hemoglobin: 12.4 g/dL (ref 12.0–15.0)
Immature Granulocytes: 0 %
Lymphocytes Relative: 25 %
Lymphs Abs: 1 10*3/uL (ref 0.7–4.0)
MCH: 28.4 pg (ref 26.0–34.0)
MCHC: 32.1 g/dL (ref 30.0–36.0)
MCV: 88.5 fL (ref 80.0–100.0)
Monocytes Absolute: 0.4 10*3/uL (ref 0.1–1.0)
Monocytes Relative: 10 %
Neutro Abs: 2.5 10*3/uL (ref 1.7–7.7)
Neutrophils Relative %: 65 %
Platelets: 72 10*3/uL — ABNORMAL LOW (ref 150–400)
RBC: 4.36 MIL/uL (ref 3.87–5.11)
RDW: 12.5 % (ref 11.5–15.5)
Smear Review: DECREASED
WBC: 3.9 10*3/uL — ABNORMAL LOW (ref 4.0–10.5)
nRBC: 0 % (ref 0.0–0.2)

## 2021-02-01 LAB — BASIC METABOLIC PANEL
Anion gap: 8 (ref 5–15)
BUN: 13 mg/dL (ref 6–20)
CO2: 26 mmol/L (ref 22–32)
Calcium: 9 mg/dL (ref 8.9–10.3)
Chloride: 104 mmol/L (ref 98–111)
Creatinine, Ser: 0.52 mg/dL (ref 0.44–1.00)
GFR, Estimated: >60 mL/min (ref >60)
Glucose, Bld: 86 mg/dL (ref 70–99)
Potassium: 3.3 mmol/L — ABNORMAL LOW (ref 3.5–5.1)
Sodium: 138 mmol/L (ref 135–145)

## 2021-02-01 MED ORDER — GABAPENTIN 300 MG PO CAPS
600.0000 mg | ORAL_CAPSULE | Freq: Once | ORAL | Status: AC
  Administered 2021-02-01: 600 mg via ORAL
  Filled 2021-02-01: qty 2

## 2021-02-01 NOTE — ED Notes (Signed)
Pt refusing vital signs prior to DC

## 2021-02-01 NOTE — ED Provider Notes (Signed)
[facility] EMERGENCY DEPARTMENT Provider Note   CSN: [id number] Arrival date & time: 02/01/21  1239     History No chief complaint on file.   Kaitlyn Duncan is a 50 y.o. female presenting the emergency department multiple complaints.  The patient is currently homeless.  She has been seen several times on a nearly daily basis in the emergency.  She reports that she has bilateral foot pain and soreness for a few weeks.  She was seen at an urgent care earlier today complaining of bilateral leg pain and calf pain, and told to come to the ED for further evaluation.  The patient tell me that she needs "a full body scan including my head".  She says that she ready has a PCP but cannot tell me why she has not been able to follow-up with them.  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   Melanoma of skin ([place]) 05/29/2018   Goals of care, counseling/discussion 05/29/2018   Brain metastasis ([place]) 01/26/2018    Past Surgical History:  Procedure Laterality Date   BIOPSY  01/28/2018   Procedure: BIOPSY;  Surgeon: Laurence Spates, MD;  Location: [facility];  Service: Endoscopy;;   BREAST BIOPSY Left    ESOPHAGOGASTRODUODENOSCOPY N/A 01/28/2018   Procedure: ESOPHAGOGASTRODUODENOSCOPY (EGD);  Surgeon: Laurence Spates, MD;  Location: [facility];  Service: Endoscopy;  Laterality: N/A;   MOUTH SURGERY     OVARY SURGERY     removed "something"     OB History     Gravida  0   Para  0   Term  0    Preterm  0   AB  0   Living  0      SAB  0   IAB  0   Ectopic  0   Multiple  0   Live Births  0           Family History  Problem Relation Age of Onset   Melanoma Mother    Hypertension Father    Breast cancer Maternal Aunt    Breast cancer Paternal Aunt    Diabetes Paternal Aunt    Breast cancer Maternal Grandmother    Breast cancer Paternal Grandmother    Diabetes Paternal Grandmother    Colon cancer Neg Hx    Esophageal cancer Neg Hx    Pancreatic cancer Neg Hx    Stomach cancer Neg Hx    Liver disease Neg Hx    Rectal cancer Neg Hx     Social History   Tobacco Use   Smoking status: Never   Smokeless tobacco: Never  Vaping Use   Vaping Use: Never used  Substance Use Topics   Alcohol use: No   Drug use: No    Home Medications Prior to Admission medications   Medication Sig Start Date End Date Taking? Authorizing Provider  Acetaminophen 500 MG capsule Take 1,000 mg by mouth every  6 (six) hours as needed for fever or pain. Patient not taking: Reported on 02/01/2021    [provider]  benztropine (COGENTIN) 1 MG tablet Take 1 tablet (1 mg total) by mouth 2 (two) times daily for EPS Patient not taking: No sig reported 11/11/20     clotrimazole (LOTRIMIN) 1 % cream Apply to affected area 2 times daily 3/71/06   Defelice, Jeanett Schlein, NP  COLLAGEN PO Take 1 tablet by mouth daily. Patient not taking: Reported on 02/01/2021    [provider]  diphenhydrAMINE (BENADRYL) 50 MG tablet Take 0.5 tablets (25 mg total) by mouth every 6 (six) hours as needed for itching. Patient not taking: Reported on 02/01/2021 01/09/21   Fransico Meadow, PA-C  fluticasone [facility]) 50 MCG/ACT nasal spray Place 2 sprays into both nostrils daily. Patient not taking: Reported on 02/01/2021 10/25/20   Montine Circle, PA-C  haloperidol (HALDOL) 10 MG tablet Take 1 tablet (10 mg total) by mouth 2 (two) times daily for psychosis Patient not taking: No sig reported 11/11/20      Lactobacillus (PROBIOTIC ACIDOPHILUS PO) Take 2 Doses by mouth daily.  Patient not taking: Reported on 02/01/2021 10/04/19   [provider]  loratadine (CLARITIN) 10 MG tablet Take 10 mg by mouth daily.    [provider]  Milk Thistle 175 MG CAPS Take 175 mg by mouth daily. Patient not taking: Reported on 02/01/2021 10/04/19   [provider]  Multiple Vitamin (MULTIVITAMIN WITH MINERALS) TABS tablet Take 1 tablet by mouth daily. Patient not taking: Reported on 02/01/2021    [provider]  mupirocin ointment (BACTROBAN) 2 % APPLY INTO THE NOSE AS DIRECTED TWICE DAILY 06/11/20 06/11/21  Wyatt Portela, MD  simethicone (MYLICON) 269 MG chewable tablet Chew 125 mg by mouth every 6 (six) hours as needed for flatulence. Patient not taking: Reported on 02/01/2021    [provider]  sodium chloride (OCEAN) 0.65 % nasal spray Place 1 spray into the nose as needed for congestion.    [provider]  TURMERIC PO Take 1 tablet by mouth daily. Patient not taking: Reported on 02/01/2021    [provider]    Allergies    Patient has no known allergies.  Review of Systems   Review of Systems  Constitutional:  Negative for chills and fever.  Gastrointestinal:  Positive for abdominal pain and nausea.  Musculoskeletal:  Positive for arthralgias and myalgias.  Skin:  Positive for rash and wound.  Neurological:  Negative for weakness and numbness.  All other systems reviewed and are negative.  Physical Exam Updated Vital Signs BP (!) 133/97 (BP Location: Left Arm)   Pulse 61   Temp 98.3 F (36.8 C)   Resp 14   SpO2 100%   Physical Exam Constitutional:      General: She is not in acute distress. HENT:     Head: Normocephalic and atraumatic.  Eyes:     Conjunctiva/sclera: Conjunctivae normal.     Pupils: Pupils are equal, round, and reactive to light.  Cardiovascular:     Rate and Rhythm: Normal rate and regular rhythm.   Pulmonary:     Effort: Pulmonary effort is normal. No respiratory distress.  Skin:    General: Skin is warm and dry.     Comments: Superficial lesion to dorsum of bilateral feet No unilateral leg swelling or tenderness  Neurological:     General: No focal deficit present.     Mental Status: She is  alert. Mental status is at baseline.    ED Results / Procedures / Treatments   Labs (all labs ordered are listed, but only abnormal results are displayed) Labs Reviewed  CBC WITH DIFFERENTIAL/PLATELET - Abnormal; Notable for the following components:      Result Value   WBC 3.9 (*)    Platelets 72 (*)    All other components within normal limits  BASIC METABOLIC PANEL - Abnormal; Notable for the following components:   Potassium 3.3 (*)    All other components within normal limits    EKG None  Radiology CT Head Wo Contrast  Result Date: 02/01/2021 CLINICAL DATA:  TIA. Tingling in left face. Bilateral lower extremity foot pain. EXAM: CT HEAD WITHOUT CONTRAST TECHNIQUE: Contiguous axial images were obtained from the base of the skull through the vertex without intravenous contrast. COMPARISON:  January 08, 2021 FINDINGS: Brain: No subdural, epidural, or subarachnoid hemorrhage. Ventricles and sulci are stable. White matter changes, moderate severity, are stable. No acute cortical ischemia or infarct. Cerebellum, brainstem, and basal cisterns are normal. No mass effect or midline shift. Vascular: No hyperdense vessel or unexpected calcification. Skull: Normal. Negative for fracture or focal lesion. Sinuses/Orbits: Mucosal thickening in the surgically altered left maxillary sinus. Fluid is seen in the inferior most left mastoid air cell, stable. No other abnormalities. Other: None. IMPRESSION: 1. No acute intracranial abnormalities. Chronic white matter changes. Electronically Signed   By: Dorise Bullion III M.D   On: 02/01/2021 13:39    Procedures Procedures   Medications Ordered in  ED Medications  gabapentin (NEURONTIN) capsule 600 mg (has no administration in time range)    ED Course  I have reviewed the triage vital signs and the nursing notes.  Pertinent labs & imaging results that were available during my care of the patient were reviewed by me and considered in my medical decision making (see chart for details).  Patient is presenting with multiple complaints.  Primarily leg soreness and blistering on her feet.  She has no evidence of infection on exam.  Extremely low suspicion for acute DVT.  I reviewed her labs today which were largely unremarkable, she has chronic thrombocytopenia.  I recommended f/u with PCP, as she repeatedly tells me she has a PCP already - I explained that chronic medical conditions should be managed outside the emergency department.  Final Clinical Impression(s) / ED Diagnoses Final diagnoses:  Neuropathy    Rx / DC Orders ED Discharge Orders     None        Wyvonnia Dusky, MD 02/01/21 1606

## 2021-02-01 NOTE — ED Provider Notes (Signed)
[facility]    CSN: [id number] Arrival date & time: 02/01/21  1030      History   Chief Complaint Chief Complaint  Patient presents with   Foot Pain    HPI Kayliana Codd is a 50 y.o. female.   Patient presents the urgent care today with bilateral foot pain that has been present for multiple weeks.  Patient states that left lower leg swelling, calf pain, tingling started this morning.  Denies history of diabetes.  Patient has been seen multiple times for bilateral foot pain in the urgent care and in the emergency department.  Denies any chest pain or shortness of breath.  Patient states that she is homeless and often has wet feet, socks, shoes.  Also has sores on the top of her feet from flip-flops.   Foot Pain   Past Medical History:  Diagnosis Date   Allergy    Anemia    Anemia    Diabetes mellitus without complication (HCC)    prediabetic   GERD (gastroesophageal reflux disease)    melanoma with met dz dx'd 01/2018   brain and adrenal   Seasonal allergies     Patient Active Problem List   Diagnosis Date Noted   Malingering 01/23/2021   Candidiasis of skin 01/23/2021   Musculoskeletal pain 01/23/2021   Psychosis (HCC)    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   Melanoma of skin (HCC) 05/29/2018   Goals of care, counseling/discussion 05/29/2018   Brain metastasis (HCC) 01/26/2018    Past Surgical History:  Procedure Laterality Date   BIOPSY  01/28/2018   Procedure: BIOPSY;  Surgeon: Carman Ching, MD;  Location: [facility];  Service: Endoscopy;;   BREAST BIOPSY Left    ESOPHAGOGASTRODUODENOSCOPY N/A 01/28/2018   Procedure: ESOPHAGOGASTRODUODENOSCOPY (EGD);  Surgeon: Carman Ching, MD;  Location: [facility];  Service: Endoscopy;  Laterality: N/A;   MOUTH SURGERY     OVARY SURGERY     removed "something"    OB History     Gravida  0   Para  0   Term  0   Preterm  0   AB  0   Living  0       SAB  0   IAB  0   Ectopic  0   Multiple  0   Live Births  0            Home Medications    Prior to Admission medications   Medication Sig Start Date End Date Taking? Authorizing Provider  Acetaminophen 500 MG capsule Take 1,000 mg by mouth every 6 (six) hours as needed for fever or pain. Patient not taking: Reported on 02/01/2021    [provider]  benztropine (COGENTIN) 1 MG tablet Take 1 tablet (1 mg total) by mouth 2 (two) times daily for EPS Patient not taking: No sig reported 11/11/20     clotrimazole (LOTRIMIN) 1 % cream Apply to affected area 2 times daily 01/23/21   Defelice, Para March, NP  COLLAGEN PO Take 1 tablet by mouth daily. Patient not taking: Reported on 02/01/2021    [provider]  diphenhydrAMINE (BENADRYL) 50 MG tablet Take 0.5 tablets (25 mg total) by mouth every 6 (six) hours as needed for itching. Patient not taking: Reported on 02/01/2021 01/09/21   Elson Areas, PA-C  fluticasone [facility]) 50 MCG/ACT nasal spray Place 2 sprays into both nostrils daily. Patient not taking: Reported on 02/01/2021 10/25/20  Montine Circle, PA-C  haloperidol (HALDOL) 10 MG tablet Take 1 tablet (10 mg total) by mouth 2 (two) times daily for psychosis Patient not taking: No sig reported 11/11/20     Lactobacillus (PROBIOTIC ACIDOPHILUS PO) Take 2 Doses by mouth daily.  Patient not taking: Reported on 02/01/2021 10/04/19   [provider]  loratadine (CLARITIN) 10 MG tablet Take 10 mg by mouth daily.    [provider]  Milk Thistle 175 MG CAPS Take 175 mg by mouth daily. Patient not taking: Reported on 02/01/2021 10/04/19   [provider]  Multiple Vitamin (MULTIVITAMIN WITH MINERALS) TABS tablet Take 1 tablet by mouth daily. Patient not taking: Reported on 02/01/2021    [provider]  mupirocin ointment (BACTROBAN) 2 % APPLY INTO THE NOSE AS DIRECTED TWICE DAILY 06/11/20 06/11/21  Wyatt Portela, MD  simethicone  (MYLICON) 836 MG chewable tablet Chew 125 mg by mouth every 6 (six) hours as needed for flatulence. Patient not taking: Reported on 02/01/2021    [provider]  sodium chloride (OCEAN) 0.65 % nasal spray Place 1 spray into the nose as needed for congestion.    [provider]  TURMERIC PO Take 1 tablet by mouth daily. Patient not taking: Reported on 02/01/2021    [provider]    Family History Family History  Problem Relation Age of Onset   Melanoma Mother    Hypertension Father    Breast cancer Maternal Aunt    Breast cancer Paternal Aunt    Diabetes Paternal Aunt    Breast cancer Maternal Grandmother    Breast cancer Paternal Grandmother    Diabetes Paternal Grandmother    Colon cancer Neg Hx    Esophageal cancer Neg Hx    Pancreatic cancer Neg Hx    Stomach cancer Neg Hx    Liver disease Neg Hx    Rectal cancer Neg Hx     Social History Social History   Tobacco Use   Smoking status: Never   Smokeless tobacco: Never  Vaping Use   Vaping Use: Never used  Substance Use Topics   Alcohol use: No   Drug use: No     Allergies   Patient has no known allergies.   Review of Systems Review of Systems Per HPI  Physical Exam Triage Vital Signs ED Triage Vitals  Enc Vitals Group     BP 02/01/21 1138 128/81     Pulse Rate 02/01/21 1138 65     Resp 02/01/21 1138 (!) 21     Temp 02/01/21 1138 98.4 F (36.9 C)     Temp Source 02/01/21 1138 Oral     SpO2 02/01/21 1138 100 %     Weight --      Height --      Head Circumference --      Peak Flow --      Pain Score 02/01/21 1135 3     Pain Loc --      Pain Edu? --      Excl. in [place]? --    No data found.  Updated Vital Signs BP 128/81 (BP Location: Left Arm)   Pulse 65   Temp 98.4 F (36.9 C) (Oral)   Resp (!) 21   SpO2 100%   Visual Acuity Right Eye Distance:   Left Eye Distance:   Bilateral Distance:    Right Eye Near:   Left Eye Near:    Bilateral Near:     Physical  Exam Constitutional:      General: She is not in acute distress.    Appearance: Normal appearance.  HENT:     Head: Normocephalic and atraumatic.  Eyes:     Extraocular Movements: Extraocular movements intact.     Conjunctiva/sclera: Conjunctivae normal.  Cardiovascular:     Rate and Rhythm: Normal rate and regular rhythm.     Pulses: Normal pulses.     Heart sounds: Normal heart sounds.  Pulmonary:     Effort: Pulmonary effort is normal.     Breath sounds: Normal breath sounds.  Skin:    General: Skin is warm.     Comments: Patient has peeling skin present to bilateral plantar surface of feet and toes.  Also has erythema present to bilateral dorsal surface of feet that is caused by rubbing of skin from patient flip-flops.  No open sores or wounds present to feet or toes.  No signs of bacterial infection.  Pedal pulses and capillary refill normal.  Sensation intact.  Neurological:     General: No focal deficit present.     Mental Status: She is alert and oriented to person, place, and time. Mental status is at baseline.  Psychiatric:        Mood and Affect: Mood normal.        Behavior: Behavior normal.        Thought Content: Thought content normal.        Judgment: Judgment normal.     UC Treatments / Results  Labs (all labs ordered are listed, but only abnormal results are displayed) Labs Reviewed - No data to display  EKG   Radiology No results found.  Procedures Procedures (including critical care time)  Medications Ordered in UC Medications - No data to display  Initial Impression / Assessment and Plan / UC Course  I have reviewed the triage vital signs and the nursing notes.  Pertinent labs & imaging results that were available during my care of the patient were reviewed by me and considered in my medical decision making (see chart for details).     Peeling of skin to bilateral feet suggestive of candidiasis of skin.  Patient has been prescribed  clotrimazole cream at recent visit but states she has not picked this medication up.  Advised patient that she needs to use this cream.  Although, patient is having tingling and pain in left lower calf that could indicate signs of lower extremity DVT.  Unable to schedule outpatient ultrasound at this time due to weekend day, so patient was advised to go to the hospital for further evaluation and management.  Patient also needing additional resources due to being homeless that cannot necessarily be provided in urgent care.  Patient advised to inquire about resources at hospital as well.  Patient started complaining of dizziness as she was being discharged from room.  Nurses put patient in wheelchair and walked with patient to the hospital.  Will defer additional treatment to ED. Final Clinical Impressions(s) / UC Diagnoses   Final diagnoses:  Foot pain, bilateral  Tinea pedis of both feet  Pain of left calf  Dizziness and giddiness     Discharge Instructions      Please go to hospital as soon as you leave urgent care for further evaluation and management.     ED Prescriptions   None    PDMP not reviewed this encounter.   Odis Luster, FNP 02/01/21 1251

## 2021-02-01 NOTE — ED Triage Notes (Signed)
Pt was at [employer] and had staff call EMS asking for a ride.  They explained they don't give rides to other locations other than the hospital.  Pt stated she feels "swimmy headed"  Pt was seen here earlier today

## 2021-02-01 NOTE — ED Notes (Signed)
Pt NAD sitting in chair with legs elevated. A/ox4. C/o chronic intermittent leg tingling. Pt offered gabapentin and accepted. Pt refusing to be DC because she is homeless and wants more done. MD spoken with pt multiple times this visit and is comfortable with workup and DC. Pt becoming uncooperative. Security called for escort out of ED. Pt yelling and being inappropriate on the way out.

## 2021-02-01 NOTE — ED Provider Notes (Signed)
Emergency Medicine Provider Triage Evaluation Note  Kaitlyn Duncan , a 50 y.o. female  was evaluated in triage. Pt complains of BL lower extremity foot pain. She feels like she has tingling to Bl lower extremities left greater than right x weeks.  Pain to LE x weeks, some swelling. No fever. States when she awoke this morning she had tingling in her left face, had something previously yesterday. No current tingling, numbness.  No difficulty with word finding, unilateral weakness. Has pain to BL feet with macerated skin.   Review of Systems  Positive: Tingling to BLE, tingling to left face, resolved Negative: Weakness, slurred speech  Physical Exam  BP (!) 133/97 (BP Location: Left Arm)   Pulse 61   Temp 98.3 F (36.8 C)   Resp 14   SpO2 100%  Gen:   Awake, no distress   Resp:  Normal effort  MSK:   Moves extremities without difficulty, compartment soft Neuro:  Intact sensation to all 4 extremities, equal grip strength, equal strength to lower extremities CN 2-12 grossly inact Other:  Macerated skin to BLE  Medical Decision Making  Medically screening exam initiated at 12:58 PM.  Appropriate orders placed.  Carlyon Shadow Glave was informed that the remainder of the evaluation will be completed by another provider, this initial triage assessment does not replace that evaluation, and the importance of remaining in the ED until their evaluation is complete.  Feet wounds, intermittent tingling  Not a code stroke due to timing, No LVO criteria   Waylyn Tenbrink A, PA-C 02/01/21 1304    Wyvonnia Dusky, MD 02/02/21 [phone]

## 2021-02-01 NOTE — Discharge Instructions (Addendum)
Please go to hospital as soon as you leave urgent care for further evaluation and management.

## 2021-02-01 NOTE — ED Notes (Signed)
pedal pulse 2 +

## 2021-02-01 NOTE — ED Triage Notes (Signed)
Bilateral foot pain for 2 weeks, bilateral foot and lower leg swelling for a week, left foot tingling for one day.  Patient is homeless and walking often.  Patient reports blisters and sores on both foot

## 2021-02-01 NOTE — ED Triage Notes (Signed)
Patient states that she is homeless the past month and developed swelling several days ago to legs and tingling this am. More tingling to left than right. Denies injury, reports does a lot of walking and blisters to bottoms of feet

## 2021-02-02 ENCOUNTER — Encounter: Payer: Self-pay | Admitting: Oncology

## 2021-02-02 NOTE — Discharge Instructions (Addendum)
Please follow up with your doctor tomorrow for further evaluation and for routine medical care.

## 2021-02-02 NOTE — ED Provider Notes (Signed)
[facility] EMERGENCY DEPARTMENT Provider Note   CSN: [id number] Arrival date & time: 02/01/21  2202     History Chief Complaint  Patient presents with   Dizziness    Kaitlyn Duncan is a 50 y.o. female.  Patient to ED with complaint of dizziness and lightheadedness. She reports she was at a [employer] and started to feel "swimmy headed" and requested EMS be called by staff. No vomiting, syncope or near syncope, no falls. She states that she hasn't eaten much today and she thinks she would feel better if she ate.   The history is provided by the patient. No language interpreter was used.  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   Melanoma of skin ([place]) 05/29/2018   Goals of care, counseling/discussion 05/29/2018   Brain metastasis ([place]) 01/26/2018    Past Surgical History:  Procedure Laterality Date   BIOPSY  01/28/2018   Procedure: BIOPSY;  Surgeon: Laurence Spates, MD;  Location: [facility];  Service: Endoscopy;;   BREAST BIOPSY Left    ESOPHAGOGASTRODUODENOSCOPY N/A 01/28/2018   Procedure: ESOPHAGOGASTRODUODENOSCOPY (EGD);  Surgeon: Laurence Spates, MD;  Location: [facility];  Service: Endoscopy;  Laterality: N/A;   MOUTH SURGERY     OVARY SURGERY     removed "something"     OB History     Gravida  0   Para  0   Term  0   Preterm  0   AB  0   Living  0      SAB  0   IAB  0   Ectopic  0   Multiple  0   Live Births  0           Family  History  Problem Relation Age of Onset   Melanoma Mother    Hypertension Father    Breast cancer Maternal Aunt    Breast cancer Paternal Aunt    Diabetes Paternal Aunt    Breast cancer Maternal Grandmother    Breast cancer Paternal Grandmother    Diabetes Paternal Grandmother    Colon cancer Neg Hx    Esophageal cancer Neg Hx    Pancreatic cancer Neg Hx    Stomach cancer Neg Hx    Liver disease Neg Hx    Rectal cancer Neg Hx     Social History   Tobacco Use   Smoking status: Never   Smokeless tobacco: Never  Vaping Use   Vaping Use: Never used  Substance Use Topics   Alcohol use: No   Drug use: No    Home Medications Prior to Admission medications   Medication Sig Start Date End Date Taking? Authorizing Provider  Acetaminophen 500 MG capsule Take 1,000 mg by mouth every 6 (six) hours as needed for fever or pain. Patient not taking: Reported on 02/01/2021    [provider]  benztropine (COGENTIN) 1 MG tablet Take 1 tablet (1 mg  total) by mouth 2 (two) times daily for EPS Patient not taking: No sig reported 11/11/20     clotrimazole (LOTRIMIN) 1 % cream Apply to affected area 2 times daily 01/02/15   Defelice, Jeanett Schlein, NP  COLLAGEN PO Take 1 tablet by mouth daily. Patient not taking: Reported on 02/01/2021    [provider]  diphenhydrAMINE (BENADRYL) 50 MG tablet Take 0.5 tablets (25 mg total) by mouth every 6 (six) hours as needed for itching. Patient not taking: Reported on 02/01/2021 01/09/21   Fransico Meadow, PA-C  fluticasone [facility]) 50 MCG/ACT nasal spray Place 2 sprays into both nostrils daily. Patient not taking: Reported on 02/01/2021 10/25/20   Montine Circle, PA-C  haloperidol (HALDOL) 10 MG tablet Take 1 tablet (10 mg total) by mouth 2 (two) times daily for psychosis Patient not taking: No sig reported 11/11/20     Lactobacillus (PROBIOTIC ACIDOPHILUS PO) Take 2 Doses by mouth daily.  Patient not taking: Reported on 02/01/2021 10/04/19    [provider]  loratadine (CLARITIN) 10 MG tablet Take 10 mg by mouth daily.    [provider]  Milk Thistle 175 MG CAPS Take 175 mg by mouth daily. Patient not taking: Reported on 02/01/2021 10/04/19   [provider]  Multiple Vitamin (MULTIVITAMIN WITH MINERALS) TABS tablet Take 1 tablet by mouth daily. Patient not taking: Reported on 02/01/2021    [provider]  mupirocin ointment (BACTROBAN) 2 % APPLY INTO THE NOSE AS DIRECTED TWICE DAILY 06/11/20 06/11/21  Wyatt Portela, MD  simethicone (MYLICON) 010 MG chewable tablet Chew 125 mg by mouth every 6 (six) hours as needed for flatulence. Patient not taking: Reported on 02/01/2021    [provider]  sodium chloride (OCEAN) 0.65 % nasal spray Place 1 spray into the nose as needed for congestion.    [provider]  TURMERIC PO Take 1 tablet by mouth daily. Patient not taking: Reported on 02/01/2021    [provider]    Allergies    Patient has no known allergies.  Review of Systems   Review of Systems  Constitutional:  Negative for chills and fever.  HENT: Negative.    Respiratory: Negative.    Cardiovascular: Negative.   Gastrointestinal: Negative.   Musculoskeletal: Negative.   Skin: Negative.   Neurological:  Positive for dizziness.   Physical Exam Updated Vital Signs BP 99/76 (BP Location: Left Arm)   Pulse 62   Temp 97.8 F (36.6 C) (Oral)   Resp 14   SpO2 100%   Physical Exam Constitutional:      Appearance: She is well-developed.  HENT:     Head: Normocephalic.  Cardiovascular:     Rate and Rhythm: Normal rate and regular rhythm.     Heart sounds: No murmur heard. Pulmonary:     Effort: Pulmonary effort is normal.     Breath sounds: Normal breath sounds. No wheezing, rhonchi or rales.  Abdominal:     General: Bowel sounds are normal.     Palpations: Abdomen is soft.     Tenderness: There is no abdominal tenderness. There is no guarding or  rebound.  Musculoskeletal:        General: Normal range of motion.     Cervical back: Normal range of motion and neck supple.  Skin:    General: Skin is warm and dry.  Neurological:     General: No focal deficit present.     Mental Status: She is alert and oriented  to person, place, and time.    ED Results / Procedures / Treatments   Labs (all labs ordered are listed, but only abnormal results are displayed) Labs Reviewed - No data to display  EKG None  Radiology CT Head Wo Contrast  Result Date: 02/01/2021 CLINICAL DATA:  TIA. Tingling in left face. Bilateral lower extremity foot pain. EXAM: CT HEAD WITHOUT CONTRAST TECHNIQUE: Contiguous axial images were obtained from the base of the skull through the vertex without intravenous contrast. COMPARISON:  January 08, 2021 FINDINGS: Brain: No subdural, epidural, or subarachnoid hemorrhage. Ventricles and sulci are stable. White matter changes, moderate severity, are stable. No acute cortical ischemia or infarct. Cerebellum, brainstem, and basal cisterns are normal. No mass effect or midline shift. Vascular: No hyperdense vessel or unexpected calcification. Skull: Normal. Negative for fracture or focal lesion. Sinuses/Orbits: Mucosal thickening in the surgically altered left maxillary sinus. Fluid is seen in the inferior most left mastoid air cell, stable. No other abnormalities. Other: None. IMPRESSION: 1. No acute intracranial abnormalities. Chronic white matter changes. Electronically Signed   By: Dorise Bullion III M.D   On: 02/01/2021 13:39    Procedures Procedures   Medications Ordered in ED Medications - No data to display  ED Course  I have reviewed the triage vital signs and the nursing notes.  Pertinent labs & imaging results that were available during my care of the patient were reviewed by me and considered in my medical decision making (see chart for details).    MDM Rules/Calculators/A&P                           Patient  to ED with c/o dizziness as per HPI.   She is very well appearing. In NAD, VSS. She is ambulatory and steady. She has been given a meal and something to drink.   Head CT done on ED encounter yesterday normal. The patient is well known to the ED with 21 visits in the last 6 months (15 in the last 30 days). No neurologic deficit on exam tonight. Do not feel repeat head CT is indicated. She is stable for discharge.   Final Clinical Impression(s) / ED Diagnoses Final diagnoses:  None   Dizziness Homeless  Rx / DC Orders ED Discharge Orders     None        Dennie Bible 02/02/21 0557    Fatima Blank, MD 02/02/21 [phone]

## 2021-02-02 NOTE — ED Notes (Signed)
Pt given bus pass, sprite, and snacks prior to DC

## 2021-02-02 NOTE — ED Notes (Signed)
Patient verbalizes understanding of discharge instructions. Opportunity for questioning and answers were provided. Armband removed by staff, pt discharged from ED ambulatory.   

## 2021-02-02 NOTE — ED Notes (Signed)
Pt ambulatory to restroom and back with little assistance - pt appears steady on her feet - pt now eating

## 2021-02-03 ENCOUNTER — Encounter: Payer: Self-pay | Admitting: Oncology

## 2021-02-09 ENCOUNTER — Telehealth: Payer: Self-pay | Admitting: Gastroenterology

## 2021-02-09 NOTE — Telephone Encounter (Signed)
Hey Dr. Tarri Glenn,   Patient called in to cancel EDG 02/10/21. States she does not believe she need the procedure done. Did not reschedule.   Thank you

## 2021-02-09 NOTE — Telephone Encounter (Signed)
Please notify her PCP of her decision. Thanks.

## 2021-02-10 ENCOUNTER — Encounter: Payer: Medicaid Other | Admitting: Gastroenterology

## 2021-02-10 NOTE — Telephone Encounter (Signed)
Note routed to pts PCP via epic.

## 2021-02-23 ENCOUNTER — Encounter (HOSPITAL_COMMUNITY): Payer: Self-pay | Admitting: Emergency Medicine

## 2021-02-23 ENCOUNTER — Emergency Department (HOSPITAL_COMMUNITY)
Admission: EM | Admit: 2021-02-23 | Discharge: 2021-02-25 | Disposition: A | Discharge status: Home / Self Care | Payer: Medicare Other | Attending: Emergency Medicine | Admitting: Emergency Medicine

## 2021-02-23 ENCOUNTER — Emergency Department (HOSPITAL_COMMUNITY): Payer: Medicare Other

## 2021-02-23 DIAGNOSIS — Z9119 Patient's noncompliance with other medical treatment and regimen: Secondary | ICD-10-CM | POA: Insufficient documentation

## 2021-02-23 DIAGNOSIS — F29 Unspecified psychosis not due to a substance or known physiological condition: Secondary | ICD-10-CM | POA: Diagnosis not present

## 2021-02-23 DIAGNOSIS — U071 COVID-19: Secondary | ICD-10-CM | POA: Insufficient documentation

## 2021-02-23 DIAGNOSIS — Z85820 Personal history of malignant melanoma of skin: Secondary | ICD-10-CM | POA: Insufficient documentation

## 2021-02-23 DIAGNOSIS — Z79899 Other long term (current) drug therapy: Secondary | ICD-10-CM | POA: Insufficient documentation

## 2021-02-23 DIAGNOSIS — E119 Type 2 diabetes mellitus without complications: Secondary | ICD-10-CM | POA: Insufficient documentation

## 2021-02-23 DIAGNOSIS — F22 Delusional disorders: Secondary | ICD-10-CM | POA: Diagnosis not present

## 2021-02-23 DIAGNOSIS — R451 Restlessness and agitation: Secondary | ICD-10-CM | POA: Diagnosis present

## 2021-02-23 LAB — CBC WITH DIFFERENTIAL/PLATELET
Abs Immature Granulocytes: 0.01 10*3/uL (ref 0.00–0.07)
Basophils Absolute: 0 10*3/uL (ref 0.0–0.1)
Basophils Relative: 0 %
Eosinophils Absolute: 0 10*3/uL (ref 0.0–0.5)
Eosinophils Relative: 0 %
HCT: 42.6 % (ref 36.0–46.0)
Hemoglobin: 13.5 g/dL (ref 12.0–15.0)
Immature Granulocytes: 0 %
Lymphocytes Relative: 18 %
Lymphs Abs: 0.9 10*3/uL (ref 0.7–4.0)
MCH: 27.4 pg (ref 26.0–34.0)
MCHC: 31.7 g/dL (ref 30.0–36.0)
MCV: 86.4 fL (ref 80.0–100.0)
Monocytes Absolute: 0.4 10*3/uL (ref 0.1–1.0)
Monocytes Relative: 7 %
Neutro Abs: 3.6 10*3/uL (ref 1.7–7.7)
Neutrophils Relative %: 75 %
Platelets: 90 10*3/uL — ABNORMAL LOW (ref 150–400)
RBC: 4.93 MIL/uL (ref 3.87–5.11)
RDW: 12.5 % (ref 11.5–15.5)
WBC: 4.9 10*3/uL (ref 4.0–10.5)
nRBC: 0 % (ref 0.0–0.2)

## 2021-02-23 LAB — RESP PANEL BY RT-PCR (FLU A&B, COVID) ARPGX2
Influenza A by PCR: NEGATIVE
Influenza B by PCR: NEGATIVE
SARS Coronavirus 2 by RT PCR: POSITIVE — AB

## 2021-02-23 LAB — BASIC METABOLIC PANEL
Anion gap: 8 (ref 5–15)
BUN: 16 mg/dL (ref 6–20)
CO2: 26 mmol/L (ref 22–32)
Calcium: 9.4 mg/dL (ref 8.9–10.3)
Chloride: 105 mmol/L (ref 98–111)
Creatinine, Ser: 0.62 mg/dL (ref 0.44–1.00)
GFR, Estimated: >60 mL/min (ref >60)
Glucose, Bld: 81 mg/dL (ref 70–99)
Potassium: 3.8 mmol/L (ref 3.5–5.1)
Sodium: 139 mmol/L (ref 135–145)

## 2021-02-23 LAB — ACETAMINOPHEN LEVEL: Acetaminophen (Tylenol), Serum: <10 ug/mL — ABNORMAL LOW (ref 10–30)

## 2021-02-23 LAB — SALICYLATE LEVEL: Salicylate Lvl: <7 mg/dL — ABNORMAL LOW (ref 7.0–30.0)

## 2021-02-23 LAB — ETHANOL: Alcohol, Ethyl (B): <10 mg/dL (ref <10)

## 2021-02-23 MED ORDER — LORAZEPAM 2 MG/ML IJ SOLN
2.0000 mg | Freq: Once | INTRAMUSCULAR | Status: AC
  Administered 2021-02-23: 2 mg via INTRAMUSCULAR
  Filled 2021-02-23: qty 1

## 2021-02-23 MED ORDER — HALOPERIDOL LACTATE 5 MG/ML IJ SOLN
5.0000 mg | Freq: Once | INTRAMUSCULAR | Status: AC
  Administered 2021-02-23: 5 mg via INTRAMUSCULAR
  Filled 2021-02-23: qty 1

## 2021-02-23 NOTE — ED Notes (Signed)
No initial set of vitals obtained due to pt being aggressive with staff by means of hitting and kicking. Patient is also screaming at staff calling them devil worshippers and stating that she is a prophet.

## 2021-02-23 NOTE — ED Triage Notes (Signed)
Patient was brought in from a hotel. [employer] was called due to the patient trespassing. [employer] called EMS. When the patient found out she was not going to be able to stay at the hotel,She allowed EMS to take her. Since then she has had outburst of aggression. She tried to elope before EMS could give report to the RN. She has not been taking her psych meds.  .   EMS vitals: Unable to obtain

## 2021-02-23 NOTE — ED Notes (Signed)
Patient not allowing staff to touch her at this time.

## 2021-02-23 NOTE — ED Provider Notes (Signed)
[facility]-EMERGENCY DEPT Provider Note   CSN: [id number] Arrival date & time: 02/23/21  1524     History No chief complaint on file.   Kaitlyn Duncan is a 50 y.o. female.  50-year-old female with prior medical history as detailed below presents with EMS.  Patient apparently was trespassing on a hotel's property.  [employer] and then EMS were called.  Patient was aggressive and physically threatening to both staff at the hotel and also to [employer] and EMS.  Patient with history of psychosis.  Patient is verbally aggressive on exam in the ED.  She reports that she is "a prophet of [name] and cannot be touched."  She is unwilling and/or unable to interact appropriately with providers today.  She quotes Bible verse and appears to be acutely psychotic.  IVC hold initiated.  The history is provided by the patient.  Mental Health Problem Presenting symptoms: aggressive behavior, agitation, bizarre behavior and delusional   Onset quality:  Unable to specify Timing:  Unable to specify Progression:  Unable to specify     Past Medical History:  Diagnosis Date   Allergy    Anemia    Anemia    Diabetes mellitus without complication (HCC)    prediabetic   GERD (gastroesophageal reflux disease)    melanoma with met dz dx'd 01/2018   brain and adrenal   Seasonal allergies     Patient Active Problem List   Diagnosis Date Noted   Malingering 01/23/2021   Candidiasis of skin 01/23/2021   Musculoskeletal pain 01/23/2021   Psychosis (HCC)    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   Melanoma of skin (HCC) 05/29/2018   Goals of care, counseling/discussion 05/29/2018   Brain metastasis (HCC) 01/26/2018    Past Surgical History:  Procedure Laterality Date   BIOPSY  01/28/2018   Procedure: BIOPSY;  Surgeon: Edwards, James, MD;  Location: [facility];  Service: Endoscopy;;   BREAST BIOPSY Left    ESOPHAGOGASTRODUODENOSCOPY N/A  01/28/2018   Procedure: ESOPHAGOGASTRODUODENOSCOPY (EGD);  Surgeon: Edwards, James, MD;  Location: [facility];  Service: Endoscopy;  Laterality: N/A;   MOUTH SURGERY     OVARY SURGERY     removed "something"     OB History     Gravida  0   Para  0   Term  0   Preterm  0   AB  0   Living  0      SAB  0   IAB  0   Ectopic  0   Multiple  0   Live Births  0           Family History  Problem Relation Age of Onset   Melanoma Mother    Hypertension Father    Breast cancer Maternal Aunt    Breast cancer Paternal Aunt    Diabetes Paternal Aunt    Breast cancer Maternal Grandmother    Breast cancer Paternal Grandmother    Diabetes Paternal Grandmother    Colon cancer Neg Hx    Esophageal cancer Neg Hx    Pancreatic cancer Neg Hx    Stomach cancer Neg Hx    Liver disease Neg Hx    Rectal cancer Neg Hx     Social History   Tobacco Use   Smoking status: Never   Smokeless tobacco: Never  Vaping Use   Vaping Use: Never used  Substance Use Topics   Alcohol use: No   Drug   use: No    Home Medications Prior to Admission medications   Medication Sig Start Date End Date Taking? Authorizing Provider  Acetaminophen 500 MG capsule Take 1,000 mg by mouth every 6 (six) hours as needed for fever or pain. Patient not taking: Reported on 02/01/2021    [provider]  benztropine (COGENTIN) 1 MG tablet Take 1 tablet (1 mg total) by mouth 2 (two) times daily for EPS Patient not taking: No sig reported 11/11/20     clotrimazole (LOTRIMIN) 1 % cream Apply to affected area 2 times daily 01/23/21   Defelice, Jeanette, NP  COLLAGEN PO Take 1 tablet by mouth daily. Patient not taking: Reported on 02/01/2021    [provider]  diphenhydrAMINE (BENADRYL) 50 MG tablet Take 0.5 tablets (25 mg total) by mouth every 6 (six) hours as needed for itching. Patient not taking: Reported on 02/01/2021 01/09/21   Sofia, Leslie K, PA-C  fluticasone (FLONASE) 50 MCG/ACT nasal  spray Place 2 sprays into both nostrils daily. Patient not taking: Reported on 02/01/2021 10/25/20   Browning, Robert, PA-C  haloperidol (HALDOL) 10 MG tablet Take 1 tablet (10 mg total) by mouth 2 (two) times daily for psychosis Patient not taking: No sig reported 11/11/20     Lactobacillus (PROBIOTIC ACIDOPHILUS PO) Take 2 Doses by mouth daily.  Patient not taking: Reported on 02/01/2021 10/04/19   [provider]  loratadine (CLARITIN) 10 MG tablet Take 10 mg by mouth daily.    [provider]  Milk Thistle 175 MG CAPS Take 175 mg by mouth daily. Patient not taking: Reported on 02/01/2021 10/04/19   [provider]  Multiple Vitamin (MULTIVITAMIN WITH MINERALS) TABS tablet Take 1 tablet by mouth daily. Patient not taking: Reported on 02/01/2021    [provider]  mupirocin ointment (BACTROBAN) 2 % APPLY INTO THE NOSE AS DIRECTED TWICE DAILY 06/11/20 06/11/21  Shadad, Firas N, MD  simethicone (MYLICON) 125 MG chewable tablet Chew 125 mg by mouth every 6 (six) hours as needed for flatulence. Patient not taking: Reported on 02/01/2021    [provider]  sodium chloride (OCEAN) 0.65 % nasal spray Place 1 spray into the nose as needed for congestion.    [provider]  TURMERIC PO Take 1 tablet by mouth daily. Patient not taking: Reported on 02/01/2021    [provider]    Allergies    Patient has no known allergies.  Review of Systems   Review of Systems  Psychiatric/Behavioral:  Positive for agitation.   All other systems reviewed and are negative.  Physical Exam Updated Vital Signs There were no vitals taken for this visit.  Physical Exam Vitals and nursing note reviewed.  Constitutional:      General: She is not in acute distress.    Appearance: Normal appearance. She is well-developed.  HENT:     Head: Normocephalic and atraumatic.  Eyes:     Conjunctiva/sclera: Conjunctivae normal.     Pupils: Pupils are equal, round,  and reactive to light.  Cardiovascular:     Rate and Rhythm: Normal rate and regular rhythm.     Heart sounds: Normal heart sounds.  Pulmonary:     Effort: Pulmonary effort is normal. No respiratory distress.     Breath sounds: Normal breath sounds.  Abdominal:     General: There is no distension.     Palpations: Abdomen is soft.     Tenderness: There is no abdominal tenderness.  Musculoskeletal:          General: No deformity. Normal range of motion.     Cervical back: Normal range of motion and neck supple.  Skin:    General: Skin is warm and dry.  Neurological:     General: No focal deficit present.     Mental Status: She is alert and oriented to person, place, and time.  Psychiatric:     Comments: Verbally and physically aggressive, quotes Bible verse, states "I am a prophet of god and cannot be touched"    ED Results / Procedures / Treatments   Labs (all labs ordered are listed, but only abnormal results are displayed) Labs Reviewed  ETHANOL  CBC WITH DIFFERENTIAL/PLATELET  BASIC METABOLIC PANEL  URINALYSIS, ROUTINE W REFLEX MICROSCOPIC  RAPID URINE DRUG SCREEN, HOSP PERFORMED  SALICYLATE LEVEL  ACETAMINOPHEN LEVEL    EKG None  Radiology No results found.  Procedures Procedures   Medications Ordered in ED Medications  LORazepam (ATIVAN) injection 2 mg (has no administration in time range)  haloperidol lactate (HALDOL) injection 5 mg (has no administration in time range)    ED Course  I have reviewed the triage vital signs and the nursing notes.  Pertinent labs & imaging results that were available during my care of the patient were reviewed by me and considered in my medical decision making (see chart for details).    MDM Rules/Calculators/A&P                           MDM  MSE complete  Kaitlyn Duncan was evaluated in Emergency Department on 02/23/2021 for the symptoms described in the history of present illness. She was evaluated in the  context of the global COVID-19 pandemic, which necessitated consideration that the patient might be at risk for infection with the SARS-CoV-2 virus that causes COVID-19. Institutional protocols and algorithms that pertain to the evaluation of patients at risk for COVID-19 are in a state of rapid change based on information released by regulatory bodies including the CDC and federal and state organizations. These policies and algorithms were followed during the patient's care in the ED.  Patient is presenting with reported aggressive and violent behavior.  Patient is delusional and apparently psychotic on evaluation  IVC hold initiated.  Patient without evidence of acute medical pathology that would interfere with further psychiatric evaluation and treatment.   Final Clinical Impression(s) / ED Diagnoses Final diagnoses:  Psychosis, unspecified psychosis type (HCC)    Rx / DC Orders ED Discharge Orders     None        ,  C, MD 02/23/21 2020  

## 2021-02-24 DIAGNOSIS — F29 Unspecified psychosis not due to a substance or known physiological condition: Secondary | ICD-10-CM | POA: Diagnosis not present

## 2021-02-24 LAB — URINALYSIS, ROUTINE W REFLEX MICROSCOPIC
Bilirubin Urine: NEGATIVE
Glucose, UA: NEGATIVE mg/dL
Hgb urine dipstick: NEGATIVE
Ketones, ur: 80 mg/dL — AB
Nitrite: NEGATIVE
Protein, ur: NEGATIVE mg/dL
Specific Gravity, Urine: 1.023 (ref 1.005–1.030)
pH: 5 (ref 5.0–8.0)

## 2021-02-24 LAB — RAPID URINE DRUG SCREEN, HOSP PERFORMED
Amphetamines: NOT DETECTED
Barbiturates: NOT DETECTED
Benzodiazepines: POSITIVE — AB
Cocaine: NOT DETECTED
Opiates: NOT DETECTED
Tetrahydrocannabinol: NOT DETECTED

## 2021-02-24 MED ORDER — LORAZEPAM 2 MG/ML IJ SOLN
2.0000 mg | Freq: Once | INTRAMUSCULAR | Status: AC
  Administered 2021-02-24: 2 mg via INTRAMUSCULAR
  Filled 2021-02-24: qty 1

## 2021-02-24 MED ORDER — HALOPERIDOL LACTATE 5 MG/ML IJ SOLN
5.0000 mg | Freq: Once | INTRAMUSCULAR | Status: AC
  Administered 2021-02-24: 5 mg via INTRAMUSCULAR
  Filled 2021-02-24: qty 1

## 2021-02-24 NOTE — ED Notes (Signed)
This RN receiving care of pt, pt not currently in non-violent restraints. Sitter at bedside. Will continue to monitor.

## 2021-02-24 NOTE — ED Notes (Signed)
Patient was given her dinner tray.

## 2021-02-24 NOTE — ED Notes (Addendum)
Patient is now sleeping. Patient was medicated earlier in the day for aggressive behavior. RN asked patient if we can obtain vital signs. No acknowledgement from patient

## 2021-02-24 NOTE — BH Assessment (Addendum)
@  0720, requested patients nurse Tiffany, RN, to set up the tele-assessment machine for patient. '@0732'$ , received notice from RN that patient has become agitated and is in the process of being medicated. Nursing will notify TTS when patient is able to be seen.

## 2021-02-24 NOTE — BH Assessment (Signed)
TTS attempted to complete TTS consult. Per NT and ED secretary, Tiffany, RN, reported patient was asleep and not able to participate at this time, please callback during next shift.

## 2021-02-24 NOTE — ED Notes (Signed)
Pt resting in bed comfortably .  Sitter at bedside.

## 2021-02-24 NOTE — ED Provider Notes (Signed)
Emergency Medicine Observation Re-evaluation Note  Kaitlyn Duncan is a 50 y.o. female, seen on rounds today.  Pt initially presented to the ED for complaints of No chief complaint on file. Currently, the patient is resting after getting Haldol and Ativan for agitation and trying to leave the emergency department.  Patient is resting comfortably now.  Physical Exam  BP 116/80   Pulse 92   Temp 97.7 F (36.5 C) (Oral)   Resp (!) 22   SpO2 98%  Physical Exam General: Sleeping and resting Cardiac: No murmur Lungs: Clear Psych: Asleep  ED Course / MDM  EKG:EKG Interpretation  Date/Time:  Monday February 23 2021 21:39:36 EDT Ventricular Rate:  62 PR Interval:  150 QRS Duration: 90 QT Interval:  412 QTC Calculation: 418 R Axis:   111 Text Interpretation: Normal sinus rhythm Left posterior fascicular block Abnormal ECG Confirmed by Dene Gentry [phone]) on 02/23/2021 9:44:11 PM  I have reviewed the labs performed to date as well as medications administered while in observation.  Recent changes in the last 24 hours include received Haldol and Ativan for aggressive behavior and trying to elope from the emergency department this morning.  Plan  Current plan is for awaiting psychiatric recommendations.  Kaitlyn Duncan is under involuntary commitment.     Kaitlyn Duncan, Gwenyth Allegra, MD 02/24/21 [phone]

## 2021-02-24 NOTE — ED Notes (Signed)
Patient continues to remove mittens and attempt to get out of bed.  Unable to direct.

## 2021-02-24 NOTE — BH Assessment (Signed)
TTS clinician attempted to complete assessment. Per Larene Beach, RN, patient refusing TTS consult and went back to sleep.

## 2021-02-25 NOTE — Progress Notes (Signed)
CSW spoke pt's Guardian DSS SW Lanae Anderson([phone]), she stated Gauardinship was given this week. DSS SW stated pt can be d/c  any where pt wants to go. CSW attached contact information to pt's AVS.  Pt stated she wanted to d/c to [place] by [facility] [address] [place], [place] [address]. CSW contacted [employer] for ride upon d/c . Ride will arrive around 5pm.  Arlie Solomons.Vashawn Ekstein, MSW, [place]  Transitions of Care Clinical Social Worker I Direct Dial: [phone]  Fax: [phone] [email or web address]

## 2021-02-25 NOTE — ED Notes (Signed)
Legal Guardian notified of DC per SW

## 2021-02-25 NOTE — Consult Note (Signed)
Kaitlyn Duncan, is 50 y.o female seen face to face by this provider at [facility] ED. Patient is lying calmly in bed asking if someone took her shoes. She is willing to give this writer her date of birth, but after telling me she would answer my questions if I spoke softer, she turned her head to right and did not answer any further questions.   This patient has metastatic cancer, has been a patient at [facility] center, canceled her July appointment with Dr. Alen Blew, where she was supposed to get lab work and Ct scan. Cancer center unable to contact patient for follow-up.  Per July 22, 2020 oncology note: 50 year old woman with melanoma of unknown primary diagnosed in July 2019.  She was found to have stage IV disease including CNS, peritoneal and subcutaneous nodules.  Disposition: This patient is psychiatrically cleared. TOC consult placed for possible housing assistance. This  patient would not benefit from inpatient psychiatric treatment, however, she would benefit from social support. Safety planning reviewed.

## 2021-02-25 NOTE — Progress Notes (Signed)
Pt asked to have her shoes back. Told pt not at this time that they are secured in her locker with her other belongings.

## 2021-02-25 NOTE — ED Notes (Signed)
Pt asleep and when attempting to obtain vitals Pt became agitated and uncooperative. Will attempt once Pt is fully awake and less combative.

## 2021-02-25 NOTE — Discharge Instructions (Addendum)
For your behavioral health needs you are advised to follow up with an outpatient provider.  Contact one of the following providers at your earliest opportunity to schedule an initial appointment:       [address] [place], [place] [address]      [phone] [place]         [facility] [place] [address]     [phone]     New patients are seen at their walk-in clinic.  Walk-in hours are Monday - Friday from 8:30 am - 12:00 pm, and from 1:00 pm - 2:30 pm.  Walk-in patients are seen on a first come, first served basis, so try to arrive as early as possible for the best chance of being seen the same day.       [facility]      [address] [place] [address]     [phone]     Follow up with DSS SW LDevoria [phone]

## 2021-02-25 NOTE — Progress Notes (Signed)
CSW spoke to safe transport, they were not aware that this pt needed ride.  Pt re-referred they can be at [facility] entrance in 20 minutes. Lurline Idol, MSW, LCSW 8/24/20226:02 PM

## 2021-02-25 NOTE — ED Provider Notes (Signed)
Emergency Medicine Observation Re-evaluation Note  Kaitlyn Duncan is a 50 y.o. female, seen on rounds today.  Pt initially presented to the ED for complaints of No chief complaint on file. Currently, the patient is resting after getting Haldol and Ativan for agitation and trying to leave the emergency department.  Patient is resting comfortably now.  Physical Exam  BP 107/77 (BP Location: Left Arm)   Pulse 87   Temp 97.7 F (36.5 C) (Oral)   Resp 14   SpO2 100%  Physical Exam General: Sleeping, in no apparent distress Lungs: Respirations even and unlabored Psych: Asleep  ED Course / MDM  EKG:EKG Interpretation  Date/Time:  Monday February 23 2021 21:39:36 EDT Ventricular Rate:  62 PR Interval:  150 QRS Duration: 90 QT Interval:  412 QTC Calculation: 418 R Axis:   111 Text Interpretation: Normal sinus rhythm Left posterior fascicular block Abnormal ECG Confirmed by Dene Gentry [id number]) on 02/23/2021 9:44:11 PM  I have reviewed the labs performed to date as well as medications administered while in observation.  Recent changes in the last 24 hours include patient becomes agitated when nursing staff attempt to obtain vitals.  Pending TTS consult/evaluation. Plan  Current plan is for awaiting psychiatric recommendations.  Kaitlyn Duncan is under involuntary commitment.         Regan Lemming, MD 02/25/21 0900

## 2021-02-25 NOTE — BHH Counselor (Signed)
TTS contacted [facility] to set up tele-psych. Per RN patient sleeping soundly and unable to participate. Will call back a little later this AM.

## 2021-02-25 NOTE — Progress Notes (Signed)
Transition of Care [facility]) - Emergency Department Mini Assessment   Patient Details  Name: Marguita Chevrier MRN: [id number] Date of Birth: 1970/11/20  [facility] CM/SW Contact:    Illene Regulus, LCSW Phone Number: 02/25/2021, 12:13 PM   Clinical Narrative:  CSW attempted to speak with pt, bedside nurse stated she was unable to talk at the time due to agitation.  Pt looked was sleeping when CSW attempted CSW will try again when pt is awake due to pt's hx of agitation.     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 [place]. [place] [address] Phone: [phone] Fax: [phone]

## 2021-02-25 NOTE — BH Assessment (Signed)
[place] Assessment Progress Note   At 10:20 this writer called the [facility] to attempt to gather background information for this pt.  I spoke to [place].  She reports that pt's record shows diagnosis of Adjustment Disorder with Anxiety.  In 2019 she had a [facility], but not an IDD Care Coordinator, and pt has never been diagnosed with IDD.  In 2019 she also received outpatient services through [facility].  There is no indication in [facility] records that pt has had a legal guardian.  Jalene Mullet, [place] Coordinator [phone]

## 2021-02-25 NOTE — Progress Notes (Signed)
CSW attempted to contact DSS Guardian to discuss d/c plans , no response left HIPPA complaint VM.    Arlie Solomons.Rashmi Tallent, MSW, [place]  Transitions of Care Clinical Social Worker I Direct Dial: [phone]  Fax: [phone] [email or web address]

## 2021-02-25 NOTE — BH Assessment (Signed)
[place] Assessment Progress Note   Per Pecolia Ades, NP, this pt does not require psychiatric hospitalization at this time.  Pt presents under IVC initiated by EDP Dene Gentry, MD which has been rescinded by Hampton Abbot, MD.  Pt is psychiatrically cleared.  Discharge instructions include referral information for several area outpatient providers, including [facility], her most recent provider.  A TOC consult has been ordered to address pt's psychosocial needs.  At 14:15 I called Devoria Albe, pt's [place] guardian to inform her of disposition.  I provided her with my own contact information as well as Christina Christovale, LCSW's.  EDP Aletta Edouard, MD and pt's nurse, Jenny Reichmann, have been notified.  Jalene Mullet, [place] Triage Specialist [phone]

## 2021-05-12 ENCOUNTER — Other Ambulatory Visit: Payer: Self-pay

## 2021-05-12 ENCOUNTER — Ambulatory Visit (HOSPITAL_COMMUNITY)
Admission: EM | Admit: 2021-05-12 | Discharge: 2021-05-12 | Disposition: A | Discharge status: Home / Self Care | Payer: Medicare Other

## 2021-05-12 ENCOUNTER — Encounter (HOSPITAL_COMMUNITY): Payer: Self-pay

## 2021-05-12 DIAGNOSIS — R142 Eructation: Secondary | ICD-10-CM | POA: Diagnosis not present

## 2021-05-12 DIAGNOSIS — M79672 Pain in left foot: Secondary | ICD-10-CM | POA: Diagnosis not present

## 2021-05-12 DIAGNOSIS — M79671 Pain in right foot: Secondary | ICD-10-CM

## 2021-05-12 NOTE — ED Triage Notes (Signed)
Pt c/o cough, congestion, and belching for the past few weeks. Pt c/o bilateral foot pain, swelling, and blisters x69month that has became worse in the past week.

## 2021-05-12 NOTE — ED Provider Notes (Signed)
[place]    CSN: [id number] Arrival date & time: 05/12/21  1243      History   Chief Complaint Chief Complaint  Patient presents with   Cough   Foot Pain    HPI Kaitlyn Duncan is a 50 y.o. female. She reports pain, swelling, and blisters on her feet for several months. A review of prior ED visits documents this is a frequent complaint of patient's.  Pt also reports increased burping and intermittent diarrhea and constipation. These symptoms are not new and have occurred in the past. Pt reports she has not been eating well, not getting enough fiber. Pt requests name of woman dermatologist to care for her skin. Does have a hx melanoma.    Cough Associated symptoms: no chills and no fever   Foot Pain Pertinent negatives include no abdominal pain.   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   Melanoma of skin ([place]) 05/29/2018   Goals of care, counseling/discussion 05/29/2018   Brain metastasis ([place]) 01/26/2018    Past Surgical History:  Procedure Laterality Date   BIOPSY  01/28/2018   Procedure: BIOPSY;  Surgeon: Laurence Spates, MD;  Location: [facility];  Service: Endoscopy;;   BREAST BIOPSY Left    ESOPHAGOGASTRODUODENOSCOPY N/A 01/28/2018   Procedure: ESOPHAGOGASTRODUODENOSCOPY (EGD);  Surgeon: Laurence Spates, MD;  Location: [facility];  Service: Endoscopy;  Laterality: N/A;   MOUTH SURGERY     OVARY SURGERY     removed "something"    OB History     Gravida  0   Para  0   Term  0    Preterm  0   AB  0   Living  0      SAB  0   IAB  0   Ectopic  0   Multiple  0   Live Births  0            Home Medications    Prior to Admission medications   Medication Sig Start Date End Date Taking? Authorizing Provider  Acetaminophen 500 MG capsule Take 1,000 mg by mouth every 6 (six) hours as needed for fever or pain.    [provider]  benztropine (COGENTIN) 1 MG tablet Take 1 tablet (1 mg total) by mouth 2 (two) times daily for EPS Patient not taking: No sig reported 11/11/20     clotrimazole (LOTRIMIN) 1 % cream Apply to affected area 2 times daily Patient not taking: No sig reported 9/56/21   Defelice, Jeanett Schlein, NP  diphenhydrAMINE (BENADRYL) 50 MG tablet Take 0.5 tablets (25 mg total) by mouth every 6 (six) hours as needed for itching. 01/09/21   Fransico Meadow, PA-C  fluticasone (FLONASE) 50 MCG/ACT nasal spray Place 2 sprays into both nostrils daily. Patient not taking: No sig reported 10/25/20   Montine Circle, PA-C  haloperidol (HALDOL) 10 MG tablet Take 1 tablet (10 mg total) by mouth 2 (two) times daily for psychosis Patient  not taking: No sig reported 11/11/20     loratadine (CLARITIN) 10 MG tablet Take 10 mg by mouth daily as needed for allergies.    [provider]  Milk Thistle 175 MG CAPS Take 175 mg by mouth daily. 10/04/19   [provider]  Multiple Vitamin (MULTIVITAMIN WITH MINERALS) TABS tablet Take 1 tablet by mouth daily.    [provider]  mupirocin ointment (BACTROBAN) 2 % APPLY INTO THE NOSE AS DIRECTED TWICE DAILY 06/11/20 06/11/21  Wyatt Portela, MD  simethicone (MYLICON) 326 MG chewable tablet Chew 125 mg by mouth every 6 (six) hours as needed for flatulence. Patient not taking: No sig reported    [provider]  sodium chloride (OCEAN) 0.65 % nasal spray Place 1 spray into the nose as needed for congestion.    [provider]  TURMERIC PO Take 1 tablet by mouth daily.    [provider]    Family History Family History  Problem Relation Age of Onset   Melanoma Mother    Hypertension Father    Breast cancer Maternal Aunt    Breast cancer Paternal Aunt    Diabetes Paternal Aunt    Breast cancer Maternal Grandmother    Breast cancer Paternal Grandmother    Diabetes Paternal Grandmother    Colon cancer Neg Hx    Esophageal cancer Neg Hx    Pancreatic cancer Neg Hx    Stomach cancer Neg Hx    Liver disease Neg Hx    Rectal cancer Neg Hx     Social History Social History   Tobacco Use   Smoking status: Never   Smokeless tobacco: Never  Vaping Use   Vaping Use: Never used  Substance Use Topics   Alcohol use: No   Drug use: No     Allergies   Patient has no known allergies.   Review of Systems Review of Systems  Constitutional:  Negative for chills and fever.  Respiratory:  Positive for cough.   Gastrointestinal:  Positive for constipation and diarrhea. Negative for abdominal pain and vomiting.       Increased burping  Skin:        Blisters on her feet and B edema of feet    Physical Exam Triage Vital Signs ED Triage Vitals [05/12/21 1354]  Enc Vitals Group     BP (!) 144/94     Pulse Rate 78     Resp 18     Temp 98.5 F (36.9 C)     Temp Source Oral     SpO2 98 %     Weight      Height      Head Circumference      Peak Flow      Pain Score 2     Pain Loc      Pain Edu?      Excl. in [place]?    No data found.  Updated Vital Signs BP (!) 144/94 (BP Location: Left Arm)   Pulse 78   Temp 98.5 F (36.9 C) (Oral)   Resp 18   SpO2 98%   Visual Acuity Right Eye Distance:   Left Eye Distance:   Bilateral Distance:    Right Eye Near:   Left Eye Near:    Bilateral Near:     Physical Exam Constitutional:      Appearance: She is not ill-appearing.  Cardiovascular:     Rate and Rhythm: Normal rate and regular rhythm.  Pulses:          Dorsalis pedis pulses are 2+ on the right side and 2+ on the left side.        Posterior tibial pulses are 2+ on the right side and 2+ on the left side.  Pulmonary:     Effort: Pulmonary effort is normal.     Breath sounds: Normal breath sounds.  Abdominal:     General: Abdomen is flat. Bowel sounds are normal.     Palpations: Abdomen is soft.     Tenderness: There is no abdominal tenderness. There is no guarding or rebound.  Feet:     Right foot:     Skin integrity: Skin integrity normal.     Toenail Condition: Right toenails are long.     Left foot:     Skin integrity: Skin integrity normal.     Toenail Condition: Left toenails are long.     Comments: B toenails long but not abnormally thick Skin:    General: Skin is warm and dry.     Comments: Skin of B feet dry and flaking. Pt has several bandaids on toes - I did not remove them. No erythema, warmth, tenderness to palpation, edema. Feet are malodorous.   Neurological:     Mental Status: She is alert.     UC Treatments / Results  Labs (all labs ordered are listed, but only abnormal results are displayed) Labs Reviewed - No data to display  EKG   Radiology No results found.  Procedures Procedures (including critical care time)  Medications Ordered in UC Medications - No data to display  Initial Impression / Assessment and Plan / UC Course  I have reviewed the triage vital signs and the nursing notes.  Pertinent labs & imaging results that were available during my care of the patient were reviewed by me and considered in my medical decision making (see chart for details).    I see no acute injuries or problems in patient's feet B except toenails are long. She reports she has trouble cutting them - given podiatry contact info. Pt asks for derm contact.  Reviewed using Gas-X for burping and OTC fiber for occasional diarrhea or constipation.   Final Clinical Impressions(s) / UC Diagnoses   Final diagnoses:  Foot pain, bilateral  Eructation     Discharge Instructions      Try contacting  the [place] for help with your toenails.   I've listed one dermatology practice below - there are others you can try if you prefer.    ED Prescriptions   None    PDMP not reviewed this encounter.   Carvel Getting, NP 05/12/21 1438

## 2021-05-12 NOTE — Discharge Instructions (Signed)
Try contacting the [facility] for help with your toenails.   I've listed one dermatology practice below - there are others you can try if you prefer.

## 2021-06-09 ENCOUNTER — Encounter (HOSPITAL_COMMUNITY): Payer: Self-pay

## 2021-06-09 ENCOUNTER — Other Ambulatory Visit: Payer: Self-pay

## 2021-06-09 ENCOUNTER — Encounter (HOSPITAL_COMMUNITY): Payer: Self-pay | Admitting: Emergency Medicine

## 2021-06-09 ENCOUNTER — Emergency Department (HOSPITAL_COMMUNITY)
Admission: EM | Admit: 2021-06-09 | Discharge: 2021-06-09 | Disposition: A | Discharge status: Home / Self Care | Payer: Medicare Other | Attending: Emergency Medicine | Admitting: Emergency Medicine

## 2021-06-09 ENCOUNTER — Emergency Department (HOSPITAL_COMMUNITY): Payer: Medicare Other

## 2021-06-09 ENCOUNTER — Ambulatory Visit (HOSPITAL_COMMUNITY)
Admission: EM | Admit: 2021-06-09 | Discharge: 2021-06-09 | Disposition: A | Discharge status: Home / Self Care | Payer: Medicare Other | Attending: Emergency Medicine | Admitting: Emergency Medicine

## 2021-06-09 DIAGNOSIS — M545 Low back pain, unspecified: Secondary | ICD-10-CM | POA: Insufficient documentation

## 2021-06-09 DIAGNOSIS — R4182 Altered mental status, unspecified: Secondary | ICD-10-CM | POA: Diagnosis not present

## 2021-06-09 DIAGNOSIS — S5002XA Contusion of left elbow, initial encounter: Secondary | ICD-10-CM | POA: Insufficient documentation

## 2021-06-09 DIAGNOSIS — M549 Dorsalgia, unspecified: Secondary | ICD-10-CM

## 2021-06-09 DIAGNOSIS — E119 Type 2 diabetes mellitus without complications: Secondary | ICD-10-CM | POA: Insufficient documentation

## 2021-06-09 DIAGNOSIS — K5900 Constipation, unspecified: Secondary | ICD-10-CM | POA: Insufficient documentation

## 2021-06-09 DIAGNOSIS — R9082 White matter disease, unspecified: Secondary | ICD-10-CM | POA: Diagnosis not present

## 2021-06-09 DIAGNOSIS — S299XXA Unspecified injury of thorax, initial encounter: Secondary | ICD-10-CM | POA: Diagnosis not present

## 2021-06-09 DIAGNOSIS — R21 Rash and other nonspecific skin eruption: Secondary | ICD-10-CM

## 2021-06-09 DIAGNOSIS — X58XXXA Exposure to other specified factors, initial encounter: Secondary | ICD-10-CM | POA: Insufficient documentation

## 2021-06-09 DIAGNOSIS — S199XXA Unspecified injury of neck, initial encounter: Secondary | ICD-10-CM | POA: Diagnosis not present

## 2021-06-09 DIAGNOSIS — R6889 Other general symptoms and signs: Secondary | ICD-10-CM | POA: Diagnosis not present

## 2021-06-09 DIAGNOSIS — S0990XA Unspecified injury of head, initial encounter: Secondary | ICD-10-CM | POA: Insufficient documentation

## 2021-06-09 DIAGNOSIS — H109 Unspecified conjunctivitis: Secondary | ICD-10-CM | POA: Diagnosis not present

## 2021-06-09 DIAGNOSIS — I6782 Cerebral ischemia: Secondary | ICD-10-CM | POA: Diagnosis not present

## 2021-06-09 DIAGNOSIS — S8002XA Contusion of left knee, initial encounter: Secondary | ICD-10-CM | POA: Diagnosis not present

## 2021-06-09 MED ORDER — DOCUSATE SODIUM 100 MG PO CAPS
100.0000 mg | ORAL_CAPSULE | Freq: Once | ORAL | Status: AC
  Administered 2021-06-09: 100 mg via ORAL

## 2021-06-09 MED ORDER — ACETAMINOPHEN 325 MG PO TABS
ORAL_TABLET | ORAL | Status: AC
  Filled 2021-06-09: qty 2

## 2021-06-09 MED ORDER — POLYMYXIN B-TRIMETHOPRIM 10000-0.1 UNIT/ML-% OP SOLN: 1.0000 [drp] | OPHTHALMIC | 0 refills | Status: DC

## 2021-06-09 MED ORDER — ACETAMINOPHEN 325 MG PO TABS
650.0000 mg | ORAL_TABLET | Freq: Once | ORAL | Status: AC
  Administered 2021-06-09: 650 mg via ORAL

## 2021-06-09 MED ORDER — HYDROCORTISONE 1 % EX CREA: TOPICAL_CREAM | CUTANEOUS | 0 refills | Status: DC

## 2021-06-09 NOTE — ED Triage Notes (Signed)
Pt presents to ED BIB [facility] from [facility]. Pt c/o bruises and rash around her mouth. EMS VSS.

## 2021-06-09 NOTE — Discharge Instructions (Signed)
For the dry skin and rash on your face you may apply hydrocortisone cream twice a day until cleared, for your feet and ankles you may apply a petroleum-based product to keep it moistened as the cold air may make your symptoms worse  Today you being treated for bacterial conjunctivitis.   Place one drop of polytrim into the effected eye every 4 hours while awake for 7 days. If the other eye starts to have symptoms you may use medication in it as well. Do not allow tip of dropper to touch eye. May use cool compress for comfort and to remove discharge if present. Pat the eye, do not wipe.  If wearing contacts, dispose of current pair. Wear glasses until symptoms have resolved.   Do not rub eyes, this may cause more irritation.  May use benadryl as needed to help if itching present.  Please avoid use of eye makeup until symptoms clear.  If symptoms persist after use of medication, please follow up at Urgent Care or with ophthalmologist (eye doctor)

## 2021-06-09 NOTE — ED Notes (Signed)
Patient verbalizes understanding of discharge instructions. Opportunity for questioning and answers were provided. Armband removed by staff, pt discharged from ED and ambulated to lobby to return home via bus
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
## 2021-06-09 NOTE — ED Provider Notes (Signed)
Received a call from patient's DSS worker, [name]; requests after visit summary faxed to her at this time for follow-up.  We will also update legal guardian contact info.  Phone number [phone].   Tacy Learn, PA-C 06/09/21 0902    Lennice Sites, DO 06/09/21 1145

## 2021-06-09 NOTE — ED Provider Notes (Signed)
[facility]    CSN: [id number] Arrival date & time: 06/09/21  2376      History   Chief Complaint Chief Complaint  Patient presents with   Rash   Eye Drainage   Foot Problem    HPI Kaitlyn Duncan is a 50 y.o. female.   Patient presents with lesions on face occurring predominantly around the mouth. Denies drainage, pain, fever, chills, pruritis.  Has not attempted treatment  Endorses dryness of bilateral feet noticed this morning.Skin is pruritic.Has not been wearing socks and shoes daily, endorses she recently got a pair of new socks. ROM  of foot and ankle intact. Denies numbness or tingling.    Endorses left eye drainage, redness, and itchiness for 1-2 weeks. Denies blurred vision,floaters, light sensitivity, URI symptoms. Has not attempted treatment.  Patient was in ED this morning for different symptoms. More than ten visits in the last 3 months in the emergency department and urgent care.     Past Medical History:  Diagnosis Date   Allergy    Anemia    Anemia    Diabetes mellitus without complication ([place])    prediabetic   GERD (gastroesophageal reflux disease)    melanoma with met dz dx'd 01/2018   brain and adrenal   Seasonal allergies     Patient Active Problem List   Diagnosis Date Noted   Malingering 01/23/2021   Candidiasis of skin 01/23/2021   Musculoskeletal pain 01/23/2021   Psychosis ([place])    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   Melanoma of skin ([place]) 05/29/2018   Goals of care, counseling/discussion 05/29/2018   Brain metastasis ([place]) 01/26/2018    Past Surgical History:  Procedure Laterality Date   BIOPSY  01/28/2018   Procedure: BIOPSY;  Surgeon: Laurence Spates, MD;  Location: [facility];  Service: Endoscopy;;   BREAST BIOPSY Left    ESOPHAGOGASTRODUODENOSCOPY N/A 01/28/2018   Procedure: ESOPHAGOGASTRODUODENOSCOPY (EGD);  Surgeon: Laurence Spates, MD;  Location: [facility];   Service: Endoscopy;  Laterality: N/A;   MOUTH SURGERY     OVARY SURGERY     removed "something"    OB History     Gravida  0   Para  0   Term  0   Preterm  0   AB  0   Living  0      SAB  0   IAB  0   Ectopic  0   Multiple  0   Live Births  0            Home Medications    Prior to Admission medications   Medication Sig Start Date End Date Taking? Authorizing Provider  Acetaminophen 500 MG capsule Take 1,000 mg by mouth every 6 (six) hours as needed for fever or pain.   Yes [provider]  Multiple Vitamin (MULTIVITAMIN WITH MINERALS) TABS tablet Take 1 tablet by mouth daily.   Yes [provider]  benztropine (COGENTIN) 1 MG tablet Take 1 tablet (1 mg total) by mouth 2 (two) times daily for EPS Patient not taking: Reported on 12/17/2020 11/11/20     clotrimazole (LOTRIMIN) 1 % cream Apply to affected area 2 times daily Patient not taking: Reported on 2/83/1517 12/18/05   Defelice, Jeanett Schlein, NP  diphenhydrAMINE (BENADRYL) 50 MG tablet Take 0.5 tablets (25 mg total) by mouth every 6 (six) hours as needed for itching. 01/09/21   Fransico Meadow, PA-C  fluticasone (FLONASE) 50 MCG/ACT nasal  spray Place 2 sprays into both nostrils daily. Patient not taking: Reported on 02/01/2021 10/25/20   Montine Circle, PA-C  haloperidol (HALDOL) 10 MG tablet Take 1 tablet (10 mg total) by mouth 2 (two) times daily for psychosis Patient not taking: Reported on 12/17/2020 11/11/20     loratadine (CLARITIN) 10 MG tablet Take 10 mg by mouth daily as needed for allergies.    [provider]  Milk Thistle 175 MG CAPS Take 175 mg by mouth daily. 10/04/19   [provider]  mupirocin ointment (BACTROBAN) 2 % APPLY INTO THE NOSE AS DIRECTED TWICE DAILY 06/11/20 06/11/21  Wyatt Portela, MD  simethicone (MYLICON) 161 MG chewable tablet Chew 125 mg by mouth every 6 (six) hours as needed for flatulence. Patient not taking: Reported on 02/01/2021    [provider]  sodium chloride (OCEAN) 0.65 % nasal spray Place 1 spray into the nose as needed for congestion.    [provider]  TURMERIC PO Take 1 tablet by mouth daily.    [provider]    Family History Family History  Problem Relation Age of Onset   Melanoma Mother    Hypertension Father    Breast cancer Maternal Aunt    Breast cancer Paternal Aunt    Diabetes Paternal Aunt    Breast cancer Maternal Grandmother    Breast cancer Paternal Grandmother    Diabetes Paternal Grandmother    Colon cancer Neg Hx    Esophageal cancer Neg Hx    Pancreatic cancer Neg Hx    Stomach cancer Neg Hx    Liver disease Neg Hx    Rectal cancer Neg Hx     Social History Social History   Tobacco Use   Smoking status: Never   Smokeless tobacco: Never  Vaping Use   Vaping Use: Never used  Substance Use Topics   Alcohol use: No   Drug use: No     Allergies   Patient has no known allergies.   Review of Systems Review of Systems Defer to HPI   Physical Exam Triage Vital Signs ED Triage Vitals  Enc Vitals Group     BP 06/09/21 0911 (!) 120/93     Pulse Rate 06/09/21 0911 81     Resp 06/09/21 0911 18     Temp 06/09/21 0911 98.7 F (37.1 C)     Temp Source 06/09/21 0911 Oral     SpO2 06/09/21 0911 100 %     Weight --      Height --      Head Circumference --      Peak Flow --      Pain Score 06/09/21 0907 0     Pain Loc --      Pain Edu? --      Excl. in [place]? --    No data found.  Updated Vital Signs BP (!) 120/93 (BP Location: Left Arm)   Pulse 81   Temp 98.7 F (37.1 C) (Oral)   Resp 18   SpO2 100%   Visual Acuity Right Eye Distance:   Left Eye Distance:   Bilateral Distance:    Right Eye Near:   Left Eye Near:    Bilateral Near:     Physical Exam Constitutional:      Appearance: Normal appearance. She is normal weight.  HENT:     Head: Normocephalic.  Eyes:     Extraocular Movements: Extraocular movements intact.      Conjunctiva/sclera:  Conjunctivae normal.     Pupils: Pupils are equal, round, and reactive to light.  Pulmonary:     Effort: Pulmonary effort is normal.  Skin:    General: Skin is warm and dry.     Comments: 3 flesh toned papules, fluctuate and non draining present on the bilateral check, non-tender, no facial swelling present, no lesions to the lips.   Dry flaking skin present on the dorsal aspect of the bilateral feel. Non pitting edema present bilaterally, ROM of foot and ankle intact, 2 + pedal pulses,   Neurological:     Mental Status: She is alert and oriented to person, place, and time. Mental status is at baseline.  Psychiatric:        Mood and Affect: Mood normal.        Behavior: Behavior normal.     UC Treatments / Results  Labs (all labs ordered are listed, but only abnormal results are displayed) Labs Reviewed - No data to display  EKG   Radiology DG Thoracic Spine 2 View  Result Date: 06/09/2021 CLINICAL DATA:  Back injury. EXAM: THORACIC SPINE 2 VIEWS COMPARISON:  None. FINDINGS: There is no evidence of thoracic spine fracture. Alignment is normal. No other significant bone abnormalities are identified. IMPRESSION: Negative. Electronically Signed   By: Jorje Guild M.D.   On: 06/09/2021 06:02   CT Head Wo Contrast  Result Date: 06/09/2021 CLINICAL DATA:  Head trauma.  Abnormal mental status EXAM: CT HEAD WITHOUT CONTRAST CT CERVICAL SPINE WITHOUT CONTRAST TECHNIQUE: Multidetector CT imaging of the head and cervical spine was performed following the standard protocol without intravenous contrast. Multiplanar CT image reconstructions of the cervical spine were also generated. COMPARISON:  Head CT 02/23/2021 FINDINGS: CT HEAD FINDINGS Brain: No evidence of acute infarction, hemorrhage, hydrocephalus, extra-axial collection or mass lesion/mass effect. Extensive for age low-density in the cerebral Jowanna Loeffler matter attributed to chronic small vessel ischemia. Vascular: No  hyperdense vessel or unexpected calcification. Skull: Normal. Negative for fracture or focal lesion. Sinuses/Orbits: No acute finding. CT CERVICAL SPINE FINDINGS Alignment: Normal. Skull base and vertebrae: No acute fracture. No primary bone lesion or focal pathologic process. Soft tissues and spinal canal: No prevertebral fluid or swelling. No visible canal hematoma. Disc levels: Disc space narrowing with C5-6 and C6-7 ridging. Multilevel facet spurring. Lucent appearance of the right C6 articular process which has benign, corticated margins on axial images Upper chest: Negative IMPRESSION: 1. No evidence of acute intracranial or cervical spine injury. 2. Premature Baker Kogler matter disease. Electronically Signed   By: Jorje Guild M.D.   On: 06/09/2021 05:48   CT Cervical Spine Wo Contrast  Result Date: 06/09/2021 CLINICAL DATA:  Head trauma.  Abnormal mental status EXAM: CT HEAD WITHOUT CONTRAST CT CERVICAL SPINE WITHOUT CONTRAST TECHNIQUE: Multidetector CT imaging of the head and cervical spine was performed following the standard protocol without intravenous contrast. Multiplanar CT image reconstructions of the cervical spine were also generated. COMPARISON:  Head CT 02/23/2021 FINDINGS: CT HEAD FINDINGS Brain: No evidence of acute infarction, hemorrhage, hydrocephalus, extra-axial collection or mass lesion/mass effect. Extensive for age low-density in the cerebral Joan Herschberger matter attributed to chronic small vessel ischemia. Vascular: No hyperdense vessel or unexpected calcification. Skull: Normal. Negative for fracture or focal lesion. Sinuses/Orbits: No acute finding. CT CERVICAL SPINE FINDINGS Alignment: Normal. Skull base and vertebrae: No acute fracture. No primary bone lesion or focal pathologic process. Soft tissues and spinal canal: No prevertebral fluid or swelling. No visible canal hematoma. Disc levels:  Disc space narrowing with C5-6 and C6-7 ridging. Multilevel facet spurring. Lucent appearance of  the right C6 articular process which has benign, corticated margins on axial images Upper chest: Negative IMPRESSION: 1. No evidence of acute intracranial or cervical spine injury. 2. Premature Makari Sanko matter disease. Electronically Signed   By: Jorje Guild M.D.   On: 06/09/2021 05:48    Procedures Procedures (including critical care time)  Medications Ordered in UC Medications - No data to display  Initial Impression / Assessment and Plan / UC Course  I have reviewed the triage vital signs and the nursing notes.  Pertinent labs & imaging results that were available during my care of the patient were reviewed by me and considered in my medical decision making (see chart for details).  Rash Bacterial conjunctivitis of left eye  1.  Hydrocortisone 1% twice daily to affected areas 2.  Polytrim 1 drop left eye every 4 hours for 7 days 3.  Urgent care follow-up as needed 4.  Tylenol 650 mg once, patient requesting to help with eye pain Final Clinical Impressions(s) / UC Diagnoses   Final diagnoses:  None   Discharge Instructions   None    ED Prescriptions   None    PDMP not reviewed this encounter.   Hans Eden, NP 06/09/21 [phone]

## 2021-06-09 NOTE — ED Triage Notes (Signed)
Pt c/o rash along mouth and cheek and dryness around her lips x1day. Pt states that they do not hurt or itch. Pt states that she has been having some neck stiffness as well.   Pt c/o redness along left eye with drainage.  Pt c/o dry skin along ankles and feet. Pt states that it is sudden and has not been there previously.

## 2021-06-10 ENCOUNTER — Encounter (HOSPITAL_COMMUNITY): Payer: Self-pay

## 2021-06-10 ENCOUNTER — Emergency Department (HOSPITAL_COMMUNITY)
Admission: EM | Admit: 2021-06-10 | Discharge: 2021-06-10 | Disposition: A | Discharge status: Home / Self Care | Payer: Medicare Other | Attending: Emergency Medicine | Admitting: Emergency Medicine

## 2021-06-10 ENCOUNTER — Other Ambulatory Visit: Payer: Self-pay

## 2021-06-10 DIAGNOSIS — S60212A Contusion of left wrist, initial encounter: Secondary | ICD-10-CM | POA: Diagnosis not present

## 2021-06-10 DIAGNOSIS — S60211A Contusion of right wrist, initial encounter: Secondary | ICD-10-CM | POA: Insufficient documentation

## 2021-06-10 DIAGNOSIS — R300 Dysuria: Secondary | ICD-10-CM | POA: Diagnosis not present

## 2021-06-10 DIAGNOSIS — S9001XA Contusion of right ankle, initial encounter: Secondary | ICD-10-CM | POA: Diagnosis not present

## 2021-06-10 DIAGNOSIS — T07XXXA Unspecified multiple injuries, initial encounter: Secondary | ICD-10-CM

## 2021-06-10 DIAGNOSIS — R2243 Localized swelling, mass and lump, lower limb, bilateral: Secondary | ICD-10-CM | POA: Insufficient documentation

## 2021-06-10 DIAGNOSIS — S6991XA Unspecified injury of right wrist, hand and finger(s), initial encounter: Secondary | ICD-10-CM | POA: Diagnosis present

## 2021-06-10 DIAGNOSIS — X58XXXA Exposure to other specified factors, initial encounter: Secondary | ICD-10-CM | POA: Insufficient documentation

## 2021-06-10 DIAGNOSIS — S9002XA Contusion of left ankle, initial encounter: Secondary | ICD-10-CM | POA: Insufficient documentation

## 2021-06-10 DIAGNOSIS — E119 Type 2 diabetes mellitus without complications: Secondary | ICD-10-CM | POA: Diagnosis not present

## 2021-06-10 DIAGNOSIS — Y92148 Other place in prison as the place of occurrence of the external cause: Secondary | ICD-10-CM | POA: Insufficient documentation

## 2021-06-10 LAB — URINALYSIS, ROUTINE W REFLEX MICROSCOPIC
Bilirubin Urine: NEGATIVE
Glucose, UA: NEGATIVE mg/dL
Hgb urine dipstick: NEGATIVE
Ketones, ur: NEGATIVE mg/dL
Leukocytes,Ua: NEGATIVE
Nitrite: NEGATIVE
Protein, ur: NEGATIVE mg/dL
Specific Gravity, Urine: >=1.03 (ref 1.005–1.030)
pH: 6 (ref 5.0–8.0)

## 2021-06-10 NOTE — ED Notes (Signed)
An After Visit Summary was printed and given to the patient. Discharge instructions given and no further questions at this time.  
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
## 2021-06-10 NOTE — ED Provider Notes (Signed)
Emergency Medicine Provider Triage Evaluation Note  Kaitlyn Duncan , a 50 y.o. female  was evaluated in triage.  Pt here for multiple complaints. Seen for same recently multiple times at urgent care and the ED.  Review of Systems  Positive: Eye problem, rashes Negative: Chest pain  Physical Exam  There were no vitals taken for this visit. Gen:   Awake, no distress   Resp:  Normal effort  MSK:   Moves extremities without difficulty   Medical Decision Making  Medically screening exam initiated at 7:33 PM.  Appropriate orders placed.  Carlyon Shadow Rogalski was informed that the remainder of the evaluation will be completed by another provider, this initial triage assessment does not replace that evaluation, and the importance of remaining in the ED until their evaluation is complete.     Rodney Booze, PA-C 06/10/21 1934    Lorelle Gibbs, DO 06/10/21 2314

## 2021-06-10 NOTE — ED Notes (Signed)
DSS Kaitlyn Duncan)- social worker

## 2021-06-10 NOTE — ED Triage Notes (Signed)
Pt reports with bilateral foot swelling for several days.

## 2021-06-10 NOTE — ED Notes (Signed)
Pt aware urine sample is needed 

## 2021-06-10 NOTE — Discharge Instructions (Addendum)
Your urine today was negative for infection.  Pick up your hydrocortisone cream and Polytrim eyedrops and take as prescribed.  Follow-up with your primary care provider as needed.  Return to the emergency department if you are having increasing/worsening bruising, pain, swelling or worsening symptoms.

## 2021-06-11 ENCOUNTER — Encounter (HOSPITAL_COMMUNITY): Payer: Self-pay

## 2021-06-11 ENCOUNTER — Emergency Department (HOSPITAL_COMMUNITY)
Admission: EM | Admit: 2021-06-11 | Discharge: 2021-06-11 | Disposition: A | Discharge status: Home / Self Care | Payer: Medicare Other | Attending: Emergency Medicine | Admitting: Emergency Medicine

## 2021-06-11 DIAGNOSIS — E119 Type 2 diabetes mellitus without complications: Secondary | ICD-10-CM | POA: Insufficient documentation

## 2021-06-11 DIAGNOSIS — W228XXA Striking against or struck by other objects, initial encounter: Secondary | ICD-10-CM | POA: Diagnosis not present

## 2021-06-11 DIAGNOSIS — S60211A Contusion of right wrist, initial encounter: Secondary | ICD-10-CM | POA: Diagnosis not present

## 2021-06-11 DIAGNOSIS — R519 Headache, unspecified: Secondary | ICD-10-CM | POA: Diagnosis not present

## 2021-06-11 DIAGNOSIS — T148XXA Other injury of unspecified body region, initial encounter: Secondary | ICD-10-CM

## 2021-06-11 DIAGNOSIS — G4489 Other headache syndrome: Secondary | ICD-10-CM | POA: Diagnosis not present

## 2021-06-11 DIAGNOSIS — S8991XA Unspecified injury of right lower leg, initial encounter: Secondary | ICD-10-CM | POA: Diagnosis present

## 2021-06-11 DIAGNOSIS — Z85820 Personal history of malignant melanoma of skin: Secondary | ICD-10-CM | POA: Insufficient documentation

## 2021-06-11 DIAGNOSIS — S60212A Contusion of left wrist, initial encounter: Secondary | ICD-10-CM | POA: Insufficient documentation

## 2021-06-11 MED ORDER — ACETAMINOPHEN 500 MG PO TABS
1000.0000 mg | ORAL_TABLET | Freq: Once | ORAL | Status: AC
  Administered 2021-06-11: 1000 mg via ORAL
  Filled 2021-06-11: qty 2

## 2021-06-11 NOTE — Discharge Instructions (Signed)
Can take tylenol or motrin for pain. Follow-up with your primary care doctor. Return here for new concerns.
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
## 2021-06-12 ENCOUNTER — Encounter (HOSPITAL_COMMUNITY): Payer: Self-pay | Admitting: *Deleted

## 2021-06-12 ENCOUNTER — Ambulatory Visit (INDEPENDENT_AMBULATORY_CARE_PROVIDER_SITE_OTHER): Payer: Medicare Other

## 2021-06-12 ENCOUNTER — Emergency Department (HOSPITAL_COMMUNITY)
Admission: EM | Admit: 2021-06-12 | Discharge: 2021-06-13 | Disposition: A | Discharge status: Home / Self Care | Payer: Medicare Other | Attending: Emergency Medicine | Admitting: Emergency Medicine

## 2021-06-12 ENCOUNTER — Ambulatory Visit (HOSPITAL_COMMUNITY)
Admission: EM | Admit: 2021-06-12 | Discharge: 2021-06-12 | Disposition: A | Discharge status: Home / Self Care | Payer: Medicare Other | Attending: Urgent Care | Admitting: Urgent Care

## 2021-06-12 ENCOUNTER — Other Ambulatory Visit: Payer: Self-pay

## 2021-06-12 ENCOUNTER — Encounter (HOSPITAL_COMMUNITY): Payer: Self-pay | Admitting: Emergency Medicine

## 2021-06-12 DIAGNOSIS — F29 Unspecified psychosis not due to a substance or known physiological condition: Secondary | ICD-10-CM | POA: Diagnosis not present

## 2021-06-12 DIAGNOSIS — Z79899 Other long term (current) drug therapy: Secondary | ICD-10-CM | POA: Diagnosis not present

## 2021-06-12 DIAGNOSIS — R140 Abdominal distension (gaseous): Secondary | ICD-10-CM | POA: Diagnosis not present

## 2021-06-12 DIAGNOSIS — E119 Type 2 diabetes mellitus without complications: Secondary | ICD-10-CM | POA: Diagnosis not present

## 2021-06-12 DIAGNOSIS — M25562 Pain in left knee: Secondary | ICD-10-CM

## 2021-06-12 DIAGNOSIS — S8002XA Contusion of left knee, initial encounter: Secondary | ICD-10-CM

## 2021-06-12 DIAGNOSIS — F600 Paranoid personality disorder: Secondary | ICD-10-CM | POA: Diagnosis not present

## 2021-06-12 DIAGNOSIS — Z85841 Personal history of malignant neoplasm of brain: Secondary | ICD-10-CM | POA: Insufficient documentation

## 2021-06-12 DIAGNOSIS — Z20822 Contact with and (suspected) exposure to covid-19: Secondary | ICD-10-CM | POA: Insufficient documentation

## 2021-06-12 DIAGNOSIS — Z9189 Other specified personal risk factors, not elsewhere classified: Secondary | ICD-10-CM | POA: Diagnosis not present

## 2021-06-12 DIAGNOSIS — Z765 Malingerer [conscious simulation]: Secondary | ICD-10-CM | POA: Insufficient documentation

## 2021-06-12 DIAGNOSIS — F22 Delusional disorders: Secondary | ICD-10-CM | POA: Diagnosis not present

## 2021-06-12 MED ORDER — SIMETHICONE 40 MG/0.6ML PO SUSP: 40.0000 mg | Freq: Four times a day (QID) | ORAL | 0 refills | Status: DC | PRN

## 2021-06-12 MED ORDER — ACETAMINOPHEN 325 MG PO TABS: 650.0000 mg | ORAL_TABLET | Freq: Four times a day (QID) | ORAL | 0 refills | Status: DC | PRN

## 2021-06-12 MED ORDER — ACETAMINOPHEN 325 MG PO TABS
650.0000 mg | ORAL_TABLET | Freq: Once | ORAL | Status: AC
  Administered 2021-06-12: 650 mg via ORAL

## 2021-06-12 MED ORDER — ACETAMINOPHEN 325 MG PO TABS
ORAL_TABLET | ORAL | Status: AC
  Filled 2021-06-12: qty 2

## 2021-06-12 NOTE — ED Provider Notes (Signed)
[facility]   MRN: [id number] DOB: 1970/11/19  Subjective:   Kaitlyn Duncan is a 50 y.o. female presenting for "GI issues". Has had 1 day history of gassiness, has been having constipation. Patient is currently homeless, has not had any way to eat, does not have a place to stay. Stays that this has made it difficult for her to eat. No bloody stools, nausea and vomiting. No history of Crohn disease, ulcerative colitis, history of gastrointestinal disorders. Has also had left knee pain, had some bruising and swelling of the left knee. No particular trauma that she can recall. Of note, she has had multiple ER visits this week with complete imaging done.  No new trauma since the last visit yesterday.  No current facility-administered medications for this encounter.  Current Outpatient Medications:    Acetaminophen 500 MG capsule, Take 1,000 mg by mouth every 6 (six) hours as needed for fever or pain., Disp: , Rfl:    hydrocortisone cream 1 %, Apply to affected area 2 times daily, Disp: 15 g, Rfl: 0   trimethoprim-polymyxin b (POLYTRIM) ophthalmic solution, Place 1 drop into the left eye every 4 (four) hours., Disp: 10 mL, Rfl: 0   No Known Allergies  Past Medical History:  Diagnosis Date   Allergy    Anemia    Anemia    Diabetes mellitus without complication ([place])    prediabetic   GERD (gastroesophageal reflux disease)    melanoma with met dz dx'd 01/2018   brain and adrenal   Seasonal allergies      Past Surgical History:  Procedure Laterality Date   BIOPSY  01/28/2018   Procedure: BIOPSY;  Surgeon: Laurence Spates, MD;  Location: [facility];  Service: Endoscopy;;   BREAST BIOPSY Left    ESOPHAGOGASTRODUODENOSCOPY N/A 01/28/2018   Procedure: ESOPHAGOGASTRODUODENOSCOPY (EGD);  Surgeon: Laurence Spates, MD;  Location: [facility];  Service: Endoscopy;  Laterality: N/A;   MOUTH SURGERY     OVARY SURGERY     removed "something"    Family History  Problem  Relation Age of Onset   Melanoma Mother    Hypertension Father    Breast cancer Maternal Aunt    Breast cancer Paternal Aunt    Diabetes Paternal Aunt    Breast cancer Maternal Grandmother    Breast cancer Paternal Grandmother    Diabetes Paternal Grandmother    Colon cancer Neg Hx    Esophageal cancer Neg Hx    Pancreatic cancer Neg Hx    Stomach cancer Neg Hx    Liver disease Neg Hx    Rectal cancer Neg Hx     Social History   Tobacco Use   Smoking status: Never   Smokeless tobacco: Never  Vaping Use   Vaping Use: Never used  Substance Use Topics   Alcohol use: No   Drug use: No    ROS   Objective:   Vitals: BP 108/75   Pulse 82   Temp 98.4 F (36.9 C)   Resp 18   SpO2 98%   Physical Exam Constitutional:      General: She is not in acute distress.    Appearance: Normal appearance. She is well-developed. She is not ill-appearing, toxic-appearing or diaphoretic.  HENT:     Head: Normocephalic and atraumatic.     Nose: Nose normal.     Mouth/Throat:     Mouth: Mucous membranes are moist.     Pharynx: Oropharynx is clear.  Eyes:  General: No scleral icterus.       Right eye: No discharge.        Left eye: No discharge.     Extraocular Movements: Extraocular movements intact.     Conjunctiva/sclera: Conjunctivae normal.     Pupils: Pupils are equal, round, and reactive to light.  Cardiovascular:     Rate and Rhythm: Normal rate and regular rhythm.     Pulses: Normal pulses.     Heart sounds: Normal heart sounds. No murmur heard.   No friction rub. No gallop.  Pulmonary:     Effort: Pulmonary effort is normal. No respiratory distress.     Breath sounds: Normal breath sounds. No stridor. No wheezing, rhonchi or rales.  Abdominal:     General: Bowel sounds are normal. There is no distension.     Palpations: Abdomen is soft. There is no mass.     Tenderness: There is no abdominal tenderness. There is no right CVA tenderness, left CVA tenderness,  guarding or rebound.  Musculoskeletal:     Left knee: Ecchymosis present. No swelling, deformity, effusion, erythema, lacerations, bony tenderness or crepitus. Normal range of motion. No tenderness. Normal alignment and normal patellar mobility.     Comments: Patient ambulates without assistance, strength 5/5 for lower extremities and symmetric.  Skin:    General: Skin is warm and dry.     Findings: No rash.  Neurological:     General: No focal deficit present.     Mental Status: She is alert and oriented to person, place, and time.  Psychiatric:        Mood and Affect: Mood normal.        Behavior: Behavior normal.        Thought Content: Thought content normal.        Judgment: Judgment normal.   DG Knee Complete 4 Views Left  Result Date: 06/12/2021 CLINICAL DATA:  Left knee pain EXAM: LEFT KNEE - COMPLETE 4+ VIEW COMPARISON:  None. FINDINGS: Frontal, bilateral oblique, lateral views of the left knee are obtained. No fracture, subluxation, or dislocation. Joint spaces are well preserved. No joint effusion. IMPRESSION: 1. Unremarkable left knee. Electronically Signed   By: Randa Ngo M.D.   On: 06/12/2021 18:02     DG Thoracic Spine 2 View  Result Date: 06/09/2021 CLINICAL DATA:  Back injury. EXAM: THORACIC SPINE 2 VIEWS COMPARISON:  None. FINDINGS: There is no evidence of thoracic spine fracture. Alignment is normal. No other significant bone abnormalities are identified. IMPRESSION: Negative. Electronically Signed   By: Jorje Guild M.D.   On: 06/09/2021 06:02   CT Head Wo Contrast  Result Date: 06/09/2021 CLINICAL DATA:  Head trauma.  Abnormal mental status EXAM: CT HEAD WITHOUT CONTRAST CT CERVICAL SPINE WITHOUT CONTRAST TECHNIQUE: Multidetector CT imaging of the head and cervical spine was performed following the standard protocol without intravenous contrast. Multiplanar CT image reconstructions of the cervical spine were also generated. COMPARISON:  Head CT 02/23/2021  FINDINGS: CT HEAD FINDINGS Brain: No evidence of acute infarction, hemorrhage, hydrocephalus, extra-axial collection or mass lesion/mass effect. Extensive for age low-density in the cerebral white matter attributed to chronic small vessel ischemia. Vascular: No hyperdense vessel or unexpected calcification. Skull: Normal. Negative for fracture or focal lesion. Sinuses/Orbits: No acute finding. CT CERVICAL SPINE FINDINGS Alignment: Normal. Skull base and vertebrae: No acute fracture. No primary bone lesion or focal pathologic process. Soft tissues and spinal canal: No prevertebral fluid or swelling. No visible canal hematoma. Disc levels:  Disc space narrowing with C5-6 and C6-7 ridging. Multilevel facet spurring. Lucent appearance of the right C6 articular process which has benign, corticated margins on axial images Upper chest: Negative IMPRESSION: 1. No evidence of acute intracranial or cervical spine injury. 2. Premature white matter disease. Electronically Signed   By: Jorje Guild M.D.   On: 06/09/2021 05:48   CT Cervical Spine Wo Contrast  Result Date: 06/09/2021 CLINICAL DATA:  Head trauma.  Abnormal mental status EXAM: CT HEAD WITHOUT CONTRAST CT CERVICAL SPINE WITHOUT CONTRAST TECHNIQUE: Multidetector CT imaging of the head and cervical spine was performed following the standard protocol without intravenous contrast. Multiplanar CT image reconstructions of the cervical spine were also generated. COMPARISON:  Head CT 02/23/2021 FINDINGS: CT HEAD FINDINGS Brain: No evidence of acute infarction, hemorrhage, hydrocephalus, extra-axial collection or mass lesion/mass effect. Extensive for age low-density in the cerebral white matter attributed to chronic small vessel ischemia. Vascular: No hyperdense vessel or unexpected calcification. Skull: Normal. Negative for fracture or focal lesion. Sinuses/Orbits: No acute finding. CT CERVICAL SPINE FINDINGS Alignment: Normal. Skull base and vertebrae: No acute  fracture. No primary bone lesion or focal pathologic process. Soft tissues and spinal canal: No prevertebral fluid or swelling. No visible canal hematoma. Disc levels: Disc space narrowing with C5-6 and C6-7 ridging. Multilevel facet spurring. Lucent appearance of the right C6 articular process which has benign, corticated margins on axial images Upper chest: Negative IMPRESSION: 1. No evidence of acute intracranial or cervical spine injury. 2. Premature white matter disease. Electronically Signed   By: Jorje Guild M.D.   On: 06/09/2021 05:48     Assessment and Plan :   PDMP not reviewed this encounter.  1. Gassiness   2. Acute pain of left knee   3. Contusion of left knee, initial encounter    Recommend simethicone for gassiness.  She has a benign abdominal exam and therefore I do not suspect an acute abdominal process.  Will manage for musculoskeletal pain, contusion given physical exam findings and multiple negative imaging series. Counseled patient on potential for adverse effects with medications prescribed/recommended today, ER and return-to-clinic precautions discussed, patient verbalized understanding.    Jaynee Eagles, [place] 06/12/21 3818

## 2021-06-12 NOTE — ED Provider Notes (Signed)
Emergency Medicine Provider Triage Evaluation Note  Sherrel Ploch , a 50 y.o. female  was evaluated in triage.  Pt complains of head and neck pain and a groin rash. She reports that demonic figures are "beating" her head while she sleeps. The patient is not cooperative with the exam and reports to me that we "need to communicate better". She reports that she will not answer any more of my questions. The patient has been seen here over 5 times in 3 days with the same complaints as previously and a negative head CT  Review of Systems  Positive: Head/neck pain, rash Negative:   Physical Exam  BP (!) 144/99 (BP Location: Right Arm)   Pulse 79   Resp 16   SpO2 100%  Gen:   Awake, no distress   Resp:  Normal effort  MSK:   Moves extremities without difficulty  Other:    Medical Decision Making  Medically screening exam initiated at 9:34 PM.  Appropriate orders placed.  Carlyon Shadow Tapp was informed that the remainder of the evaluation will be completed by another provider, this initial triage assessment does not replace that evaluation, and the importance of remaining in the ED until their evaluation is complete.  The patient will not let me evaluate her. Defer labs and imaging to the back.    Sherrell Puller, PA-C 06/12/21 2137    Tegeler, Gwenyth Allegra, MD 06/12/21 [phone]
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
## 2021-06-12 NOTE — ED Triage Notes (Signed)
Pt reports SX's have been going on for days. Pt reports diarrhea ,knee pain

## 2021-06-13 ENCOUNTER — Encounter (HOSPITAL_COMMUNITY): Payer: Self-pay

## 2021-06-13 ENCOUNTER — Emergency Department (EMERGENCY_DEPARTMENT_HOSPITAL)
Admission: EM | Admit: 2021-06-13 | Discharge: 2021-06-15 | Disposition: A | Discharge status: Home / Self Care | Payer: Medicare Other | Source: Home / Self Care | Attending: Emergency Medicine | Admitting: Emergency Medicine

## 2021-06-13 ENCOUNTER — Other Ambulatory Visit: Payer: Self-pay

## 2021-06-13 DIAGNOSIS — Z79899 Other long term (current) drug therapy: Secondary | ICD-10-CM | POA: Insufficient documentation

## 2021-06-13 DIAGNOSIS — Z765 Malingerer [conscious simulation]: Secondary | ICD-10-CM

## 2021-06-13 DIAGNOSIS — Z20822 Contact with and (suspected) exposure to covid-19: Secondary | ICD-10-CM | POA: Insufficient documentation

## 2021-06-13 DIAGNOSIS — F22 Delusional disorders: Secondary | ICD-10-CM

## 2021-06-13 DIAGNOSIS — F600 Paranoid personality disorder: Secondary | ICD-10-CM | POA: Insufficient documentation

## 2021-06-13 DIAGNOSIS — Y900 Blood alcohol level of less than 20 mg/100 ml: Secondary | ICD-10-CM | POA: Insufficient documentation

## 2021-06-13 DIAGNOSIS — F29 Unspecified psychosis not due to a substance or known physiological condition: Secondary | ICD-10-CM | POA: Insufficient documentation

## 2021-06-13 DIAGNOSIS — E119 Type 2 diabetes mellitus without complications: Secondary | ICD-10-CM | POA: Insufficient documentation

## 2021-06-13 DIAGNOSIS — Z9189 Other specified personal risk factors, not elsewhere classified: Secondary | ICD-10-CM

## 2021-06-13 LAB — COMPREHENSIVE METABOLIC PANEL
ALT: 10 U/L (ref 0–44)
AST: 24 U/L (ref 15–41)
Albumin: 3.7 g/dL (ref 3.5–5.0)
Alkaline Phosphatase: 71 U/L (ref 38–126)
Anion gap: 7 (ref 5–15)
BUN: 18 mg/dL (ref 6–20)
CO2: 26 mmol/L (ref 22–32)
Calcium: 8.7 mg/dL — ABNORMAL LOW (ref 8.9–10.3)
Chloride: 103 mmol/L (ref 98–111)
Creatinine, Ser: 0.49 mg/dL (ref 0.44–1.00)
GFR, Estimated: >60 mL/min (ref >60)
Glucose, Bld: 119 mg/dL — ABNORMAL HIGH (ref 70–99)
Potassium: 3.4 mmol/L — ABNORMAL LOW (ref 3.5–5.1)
Sodium: 136 mmol/L (ref 135–145)
Total Bilirubin: 0.4 mg/dL (ref 0.3–1.2)
Total Protein: 6.5 g/dL (ref 6.5–8.1)

## 2021-06-13 LAB — I-STAT BETA HCG BLOOD, ED (MC, WL, AP ONLY): I-stat hCG, quantitative: <5 m[IU]/mL (ref <5)

## 2021-06-13 LAB — CBC
HCT: 36.4 % (ref 36.0–46.0)
Hemoglobin: 11.5 g/dL — ABNORMAL LOW (ref 12.0–15.0)
MCH: 27.6 pg (ref 26.0–34.0)
MCHC: 31.6 g/dL (ref 30.0–36.0)
MCV: 87.5 fL (ref 80.0–100.0)
Platelets: 46 10*3/uL — ABNORMAL LOW (ref 150–400)
RBC: 4.16 MIL/uL (ref 3.87–5.11)
RDW: 13.1 % (ref 11.5–15.5)
WBC: 5.3 10*3/uL (ref 4.0–10.5)
nRBC: 0 % (ref 0.0–0.2)

## 2021-06-13 LAB — RESP PANEL BY RT-PCR (FLU A&B, COVID) ARPGX2
Influenza A by PCR: NEGATIVE
Influenza B by PCR: NEGATIVE
SARS Coronavirus 2 by RT PCR: NEGATIVE

## 2021-06-13 LAB — ETHANOL: Alcohol, Ethyl (B): <10 mg/dL (ref <10)

## 2021-06-13 LAB — ACETAMINOPHEN LEVEL: Acetaminophen (Tylenol), Serum: <10 ug/mL — ABNORMAL LOW (ref 10–30)

## 2021-06-13 LAB — SALICYLATE LEVEL: Salicylate Lvl: <7 mg/dL — ABNORMAL LOW (ref 7.0–30.0)

## 2021-06-13 MED ORDER — ZIPRASIDONE MESYLATE 20 MG IM SOLR
20.0000 mg | INTRAMUSCULAR | Status: AC | PRN
  Administered 2021-06-14: 20 mg via INTRAMUSCULAR
  Filled 2021-06-13: qty 20

## 2021-06-13 MED ORDER — ZIPRASIDONE MESYLATE 20 MG IM SOLR
10.0000 mg | Freq: Once | INTRAMUSCULAR | Status: AC
  Administered 2021-06-13: 10 mg via INTRAMUSCULAR
  Filled 2021-06-13: qty 20

## 2021-06-13 MED ORDER — ACETAMINOPHEN 325 MG PO TABS
650.0000 mg | ORAL_TABLET | ORAL | Status: DC | PRN
  Administered 2021-06-14 (×2): 650 mg via ORAL
  Filled 2021-06-13 (×2): qty 2

## 2021-06-13 MED ORDER — ONDANSETRON HCL 4 MG PO TABS: 4.0000 mg | ORAL_TABLET | Freq: Three times a day (TID) | ORAL | Status: DC | PRN

## 2021-06-13 MED ORDER — OLANZAPINE 5 MG PO TBDP
10.0000 mg | ORAL_TABLET | Freq: Three times a day (TID) | ORAL | Status: DC | PRN
  Filled 2021-06-13: qty 2

## 2021-06-13 MED ORDER — OLANZAPINE 5 MG PO TBDP
10.0000 mg | ORAL_TABLET | Freq: Once | ORAL | Status: DC
  Filled 2021-06-13: qty 2

## 2021-06-13 MED ORDER — ACETAMINOPHEN 500 MG PO TABS
500.0000 mg | ORAL_TABLET | Freq: Once | ORAL | Status: DC
  Filled 2021-06-13: qty 1

## 2021-06-13 MED ORDER — LORAZEPAM 1 MG PO TABS: 1.0000 mg | ORAL_TABLET | ORAL | Status: DC | PRN

## 2021-06-13 NOTE — ED Notes (Signed)
Pt sleeping at this time.

## 2021-06-13 NOTE — ED Notes (Signed)
Called x 2 NO answer 

## 2021-06-13 NOTE — ED Notes (Signed)
Patient has been called multiple times and per SW and security and ran off to [facility]. Per SW the caretaker/guardian is considering IVC but currently no paperwork and patient has not returned from [facility]

## 2021-06-13 NOTE — ED Notes (Signed)
Pt refused assessment, became increasingly verbally abusive towards staff and is stable for discharge per MD

## 2021-06-13 NOTE — BH Assessment (Signed)
Contacted RN to initiate TTS consult. Pt has been given medication and is currently too somnolent to participate in tele-assessment. TTS will complete tele-assessment when Pt is able to participate.   Evelena Peat, [facility], [facility] Triage Specialist [phone]

## 2021-06-13 NOTE — TOC Initial Note (Addendum)
Transition of Care [facility]) - Initial/Assessment Note    Patient Details  Name: Kaitlyn Duncan MRN: [id number] Date of Birth: 09-19-1970  Transition of Care [facility]) CM/SW Contact:    Alfredia Ferguson, LCSW Phone Number: 06/13/2021, 11:24 AM   1:50p: EDCSW was informed by legal guardian she took out an involuntary commitment at the magistrates office. EDCSW contacted magistrate and confirmed it was pending the custody order being served. EDCSW went to inform ED staff but noted patient was at the nursing station on [address] asking staff why she cannot see her family and insisting her family was here. EDCSW approached patient and re-directed her back to the emergency department noting she believed she was here to see her family and not for any medical reasons and appeared to not be oriented. EDCSW led patient back to emergency department and informed staff of custody order pending being served. Patient was taken back into triage where she began to express she wanted people to be clear with her and EDCSW discussed she needed the MD to talk with her before she goes and patient reported she refused. EDCSW informed her that until a psychiatric provider sees her to clear her IVC she would need to be patient but patient began walking out of the ED triage. EDCSW followed patient while reaching out to [place] who had developed rapport with patient for assistance who contacted security. Patient headed toward the atrium and went into the women's bathroom. EDCSW alerted security at the [facility] desk who additionally received the page as EDCSW approached. CSW Jess attempted to de-escalate/redirect patient back to the emergency department. Patient however left the bathroom and entered into the [place] and was unredirectable resulting in security intervening and transporting patient back to the emergency department lobby. [employer] was in lobby waiting with the custody order. TOC will continue to follow pending TTS assessment.   Legal  guardian: Vincente Liberty with [place], [phone]  Clinical Narrative:                EDCSW called by security regarding patient at the [facility] lobby that was discharged from the emergency department with the assistance of security earlier in the day. Per security patient was at [facility] and was requesting to be admitted. EDCSW was informed patient had legal guardian. EDCSW contacted DSS APS on-call regarding alerting legal guardian of situation and possible trespassing the patient. EDCSW presented to [facility] and was updated by security and registration regarding situation. EDCSW went out to speak with patient who immediately declined and stated she wanted to speak with a female presenting Education officer, museum and not with Probation officer. EDCSW offered to see if she had a coat and noted registration provided patient with a blanket. EDCSW discussed with TOC peers and noted CSW Jess assisted with talking with patient.  While CSW spoke with patient EDCSW received return call from guardian. Per guardian patient had discharged from jail Monday and was homeless prior. Per guardian when she attempted to meet with patient in jail she would refuse to talk with her. Per guardian outside of this patient will randomly email her but not participate in placement. Patient has had 5-6 ED visits since jail release. Patient was petitioned by guardian on 06/10/21 but custody order not served as they failed to locate patient. Guardian expresses concerns of patient psychiatric status related to unmanaged psychosis. EDCSW was informed by CSW of concerns of this during their conversation (see their note when available for details).  EDCSW informed guardian if patient is assessed and cleared by psych she will be discharged and will need placement by guardian. Guardian verbalized understanding and kept reflecting back on patient not engaging with her. EDCSW notes if patient is psych cleared and no obvious  neurocognitive/medical needs then patient will need to discharge and guardian informed of discharge.   Expected Discharge Plan: Homeless Shelter (TBD) Barriers to Discharge: ED Psych evaluation   Patient Goals and CMS Choice        Expected Discharge Plan and Services Expected Discharge Plan: Homeless Shelter (TBD)       Living arrangements for the past 2 months: Homeless, Artist                                      Prior Living Arrangements/Services Living arrangements for the past 2 months: Homeless, Artist Lives with:: Self Patient language and need for interpreter reviewed:: No Do you feel safe going back to the place where you live?: No      Need for Family Participation in Patient Care: Yes (Comment) Care giver support system in place?: No (comment)   Criminal Activity/Legal Involvement Pertinent to Current Situation/Hospitalization: No - Comment as needed  Activities of Daily Living      Permission Sought/Granted Permission sought to share information with : Guardian Permission granted to share information with : Yes, Verbal Permission Granted  Share Information with NAME: [name], [place] DSS           Emotional Assessment Appearance:: Appears older than stated age Attitude/Demeanor/Rapport: Engaged Affect (typically observed): Anxious Orientation: : Fluctuating Orientation (Suspected and/or reported Sundowners) Alcohol / Substance Use: Never Used    Admission diagnosis:  Z04.6 Patient Active Problem List   Diagnosis Date Noted   Malingering 01/23/2021   Candidiasis of skin 01/23/2021   Musculoskeletal pain 01/23/2021   Psychosis ([place])    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   Melanoma of skin ([place]) 05/29/2018   Goals of care, counseling/discussion 05/29/2018   Brain metastasis ([place]) 01/26/2018   PCP:  Lucianne Lei, MD Pharmacy:   [facility], [address] [place] [address] Phone: [phone] Fax: [phone]     Social Determinants of Health (SDOH) Interventions    Readmission Risk Interventions No flowsheet data found.

## 2021-06-13 NOTE — ED Notes (Signed)
Pt refusing medication.

## 2021-06-13 NOTE — ED Triage Notes (Signed)
Patient located by security and returned from panera to lobby. [employer] now with patient who have served IVC paperwork on patient. Patient angry that she is not being admitted upstairs for ongoing neck pain. Patient states that there is no way DDS would have taken out IVC paperwork because she has no DDS case worker. Patient alert and oriented. Denies SI/HI
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
## 2021-06-13 NOTE — Discharge Instructions (Signed)
Please follow up with your family doctor.

## 2021-06-13 NOTE — ED Provider Notes (Signed)
[facility] EMERGENCY DEPARTMENT Provider Note   CSN: [id number] Arrival date & time: 06/12/21  1907     History Chief Complaint  Patient presents with   Multiple Complaints    Kaitlyn Duncan is a 50 y.o. female.  50 yo F with a chief complaints of needing to be admitted.  Patient is unwilling to discuss any further of her history or why she needs to be upstairs.       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   Melanoma of skin ([place]) 05/29/2018   Goals of care, counseling/discussion 05/29/2018   Brain metastasis ([place]) 01/26/2018    Past Surgical History:  Procedure Laterality Date   BIOPSY  01/28/2018   Procedure: BIOPSY;  Surgeon: Laurence Spates, MD;  Location: [facility];  Service: Endoscopy;;   BREAST BIOPSY Left    ESOPHAGOGASTRODUODENOSCOPY N/A 01/28/2018   Procedure: ESOPHAGOGASTRODUODENOSCOPY (EGD);  Surgeon: Laurence Spates, MD;  Location: [facility];  Service: Endoscopy;  Laterality: N/A;   MOUTH SURGERY     OVARY SURGERY     removed "something"     OB History     Gravida  0   Para  0   Term  0   Preterm  0   AB  0   Living  0      SAB  0   IAB  0   Ectopic  0   Multiple  0   Live Births  0           Family History  Problem Relation Age of Onset   Melanoma Mother    Hypertension Father    Breast cancer Maternal Aunt    Breast cancer Paternal Aunt    Diabetes Paternal Aunt    Breast cancer Maternal Grandmother    Breast cancer Paternal  Grandmother    Diabetes Paternal Grandmother    Colon cancer Neg Hx    Esophageal cancer Neg Hx    Pancreatic cancer Neg Hx    Stomach cancer Neg Hx    Liver disease Neg Hx    Rectal cancer Neg Hx     Social History   Tobacco Use   Smoking status: Never   Smokeless tobacco: Never  Vaping Use   Vaping Use: Never used  Substance Use Topics   Alcohol use: No   Drug use: No    Home Medications Prior to Admission medications   Medication Sig Start Date End Date Taking? Authorizing Provider  acetaminophen (TYLENOL) 325 MG tablet Take 2 tablets (650 mg total) by mouth every 6 (six) hours as needed. 06/12/21   Jaynee Eagles, PA-C  hydrocortisone cream 1 % Apply to affected area 2 times daily 06/09/21   Hans Eden, NP  simethicone (MYLICON) 40 BS/4.9QP drops Take 0.6 mLs (40 mg total) by mouth 4 (four) times daily as needed for flatulence. 06/12/21   Jaynee Eagles, PA-C  trimethoprim-polymyxin b (POLYTRIM) ophthalmic solution Place 1 drop  into the left eye every 4 (four) hours. 06/09/21   Valinda Hoar, NP    Allergies    Patient has no known allergies.  Review of Systems   Review of Systems  Unable to perform ROS: Other (patient unwilling)   Physical Exam Updated Vital Signs BP 135/85 (BP Location: Right Arm)   Pulse 82   Temp 98.6 F (37 C)   Resp 17   SpO2 98%   Physical Exam Vitals and nursing note reviewed.  Constitutional:      General: She is not in acute distress.    Appearance: She is well-developed. She is not diaphoretic.     Comments: Patient is unwilling to be examined limits exam.  HENT:     Head: Normocephalic and atraumatic.  Eyes:     Pupils: Pupils are equal, round, and reactive to light.  Pulmonary:     Effort: Pulmonary effort is normal.  Abdominal:     Palpations: Abdomen is soft.  Musculoskeletal:        General: No tenderness.     Cervical back: Normal range of motion and neck supple.  Skin:    General: Skin is warm and dry.   Neurological:     Mental Status: She is alert.  Psychiatric:        Behavior: Behavior normal.    ED Results / Procedures / Treatments   Labs (all labs ordered are listed, but only abnormal results are displayed) Labs Reviewed - No data to display  EKG None  Radiology DG Knee Complete 4 Views Left  Result Date: 06/12/2021 CLINICAL DATA:  Left knee pain EXAM: LEFT KNEE - COMPLETE 4+ VIEW COMPARISON:  None. FINDINGS: Frontal, bilateral oblique, lateral views of the left knee are obtained. No fracture, subluxation, or dislocation. Joint spaces are well preserved. No joint effusion. IMPRESSION: 1. Unremarkable left knee. Electronically Signed   By: Sharlet Salina M.D.   On: 06/12/2021 18:02    Procedures Procedures   Medications Ordered in ED Medications - No data to display  ED Course  I have reviewed the triage vital signs and the nursing notes.  Pertinent labs & imaging results that were available during my care of the patient were reviewed by me and considered in my medical decision making (see chart for details).    MDM Rules/Calculators/A&P                           50 yo F well-known to this emergency department with 23 visits in the past 6 months and 6 visits in the past 4 days.  She checked into the Emergency Department after being discharged recently.  Has had multiple imaging studies plain films of her legs.  She tells me that she needs to be put into the hospital but is unwilling to provide any further history or let me examine her.  I feel based on her presentation that she most likely is malingering and I will discharge.  7:16 AM:  I have discussed the diagnosis/risks/treatment options with the patient and believe the pt to be eligible for discharge home to follow-up with PCP. We also discussed returning to the ED immediately if new or worsening sx occur. We discussed the sx which are most concerning (e.g., sudden worsening pain, fever, inability to tolerate by mouth)  that necessitate immediate return. Medications administered to the patient during their visit and any new prescriptions provided to the patient are listed below.  Medications given during this visit Medications - No data to display   The patient appears reasonably screen and/or stabilized for discharge and I doubt any other medical condition or other [facility] requiring further screening, evaluation, or treatment in the ED at this time prior to discharge.   Final Clinical Impression(s) / ED Diagnoses Final diagnoses:  Malingering    Rx / DC Orders ED Discharge Orders     None        Deno Etienne, DO 06/13/21 [id number]

## 2021-06-13 NOTE — ED Provider Notes (Signed)
Emergency Medicine Provider Triage Evaluation Note  Kaitlyn Duncan , a 50 y.o. female  was evaluated in triage.  Pt complains of needing to be seen.  Patient seen multiple times over the past few days.  She was discharged this morning after being seen.  She continues to state that she "has an admitted bed upstairs and needs to address her health issues."  After she was discharged she was located by security and return from [place] to the lobby.  She now presents with [facility] PD who have BC paperwork taken out by DSS.  She denies SI/HI/AVH.  Does appear to have delusions or hallucinations that she is should be admitted.  She states that she was sent here from urgent care last night to be admitted.  When confronted about this information she changes the story and states that she was supposed to come to the emergency department but then to be admitted upstairs.  When I confronted her about her wandering in the hospital she states that she was going to see family upstairs who are waiting on her.  I asked her who and she states her aunt or cousin.  When I asked how she knew they were here she states that "I knew over the airways.  I have a spiritual connection to them."  Review of Systems  Positive: See above  Negative:   Physical Exam  BP (!) 133/91   Pulse 95   Temp 98.2 F (36.8 C) (Oral)   Resp 16   SpO2 100%  Gen:   Awake, no distress   Resp:  Normal effort  MSK:   Moves extremities without difficulty  Other:    Medical Decision Making  Medically screening exam initiated at 2:58 PM.  Appropriate orders placed.  Carlyon Shadow Shurtz was informed that the remainder of the evaluation will be completed by another provider, this initial triage assessment does not replace that evaluation, and the importance of remaining in the ED until their evaluation is complete.  Patient presents with [employer] with IVC paperwork taken out by social work.   Mickie Hillier, PA-C 06/13/21 1501     Luna Fuse, MD 06/15/21 [phone]

## 2021-06-13 NOTE — ED Provider Notes (Signed)
[facility] EMERGENCY DEPARTMENT Provider Note   CSN: [id number] Arrival date & time: 06/13/21  1108     History No chief complaint on file.   Kaitlyn Duncan is a 50 y.o. female.  Presents to ER with concern for psych evaluation.  Patient was wandering in the hospital.  Accompanied by [employer], patient was served with IVC paperwork.  DSS.  History limited due to psychosis.  Per review of chart, recent ER visits and note from Education officer, museum -Per Education officer, museum, having hyperreligious thoughts.  Paranoia, psychosis.  Multiple recent ER visits, felt to be malingering behavior.  CT head completed on 12/6 demonstrated no acute intracranial abnormality.  Patient denies any acute medical complaints right now but provides very limited history.  Answers some basic questions but then ignores most.    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   Melanoma of skin ([place]) 05/29/2018   Goals of care, counseling/discussion 05/29/2018   Brain metastasis ([place]) 01/26/2018    Past Surgical History:  Procedure Laterality Date   BIOPSY  01/28/2018   Procedure: BIOPSY;  Surgeon: Laurence Spates, MD;  Location: [facility];  Service: Endoscopy;;   BREAST BIOPSY Left    ESOPHAGOGASTRODUODENOSCOPY N/A 01/28/2018   Procedure: ESOPHAGOGASTRODUODENOSCOPY (EGD);  Surgeon: Laurence Spates, MD;  Location: [facility];  Service: Endoscopy;  Laterality: N/A;   MOUTH SURGERY     OVARY SURGERY     removed "something"      OB History     Gravida  0   Para  0   Term  0   Preterm  0   AB  0   Living  0      SAB  0   IAB  0   Ectopic  0   Multiple  0   Live Births  0           Family History  Problem Relation Age of Onset   Melanoma Mother    Hypertension Father    Breast cancer Maternal Aunt    Breast cancer Paternal Aunt    Diabetes Paternal Aunt    Breast cancer Maternal Grandmother    Breast cancer Paternal Grandmother    Diabetes Paternal Grandmother    Colon cancer Neg Hx    Esophageal cancer Neg Hx    Pancreatic cancer Neg Hx    Stomach cancer Neg Hx    Liver disease Neg Hx    Rectal cancer Neg Hx     Social History   Tobacco Use   Smoking status: Never   Smokeless tobacco: Never  Vaping Use   Vaping Use: Never used  Substance Use Topics   Alcohol use: No   Drug use: No    Home Medications Prior to Admission medications   Medication Sig Start Date End Date Taking? Authorizing Provider  acetaminophen (TYLENOL) 325 MG tablet Take 2 tablets (650 mg total) by mouth every 6 (six)  hours as needed. 06/12/21   Jaynee Eagles, PA-C  hydrocortisone cream 1 % Apply to affected area 2 times daily 06/09/21   Hans Eden, NP  simethicone (MYLICON) 40 QM/2.5OI drops Take 0.6 mLs (40 mg total) by mouth 4 (four) times daily as needed for flatulence. 06/12/21   Jaynee Eagles, PA-C  trimethoprim-polymyxin b (POLYTRIM) ophthalmic solution Place 1 drop into the left eye every 4 (four) hours. 06/09/21   Hans Eden, NP    Allergies    Patient has no known allergies.  Review of Systems   Review of Systems  Unable to perform ROS: Psychiatric disorder   Physical Exam Updated Vital Signs BP (!) 133/91   Pulse 95   Temp 98.2 F (36.8 C) (Oral)   Resp 16   SpO2 100%   Physical Exam Vitals and nursing note reviewed.  Constitutional:      Appearance: She is well-developed.     Comments: Rambling in hallway bed, answer some basic questions but ignores most  questions  HENT:     Head: Normocephalic and atraumatic.  Eyes:     Conjunctiva/sclera: Conjunctivae normal.  Cardiovascular:     Rate and Rhythm: Normal rate and regular rhythm.     Heart sounds: No murmur heard. Pulmonary:     Effort: Pulmonary effort is normal. No respiratory distress.     Breath sounds: Normal breath sounds.  Abdominal:     Palpations: Abdomen is soft.     Tenderness: There is no abdominal tenderness.  Musculoskeletal:        General: No swelling.     Cervical back: Neck supple.  Skin:    General: Skin is warm and dry.     Capillary Refill: Capillary refill takes less than 2 seconds.  Neurological:     Comments: Alert, follows basic commands  Psychiatric:     Comments: Rambling speech, flattened affect    ED Results / Procedures / Treatments   Labs (all labs ordered are listed, but only abnormal results are displayed) Labs Reviewed  COMPREHENSIVE METABOLIC PANEL - Abnormal; Notable for the following components:      Result Value   Potassium 3.4 (*)    Glucose, Bld 119 (*)    Calcium 8.7 (*)    All other components within normal limits  SALICYLATE LEVEL - Abnormal; Notable for the following components:   Salicylate Lvl <3.7 (*)    All other components within normal limits  ACETAMINOPHEN LEVEL - Abnormal; Notable for the following components:   Acetaminophen (Tylenol), Serum <10 (*)    All other components within normal limits  CBC - Abnormal; Notable for the following components:   Hemoglobin 11.5 (*)    Platelets 46 (*)    All other components within normal limits  ETHANOL  RAPID URINE DRUG SCREEN, HOSP PERFORMED  I-STAT BETA HCG BLOOD, ED ([facility], [facility], [facility] ONLY)    EKG None  Radiology DG Knee Complete 4 Views Left  Result Date: 06/12/2021 CLINICAL DATA:  Left knee pain EXAM: LEFT KNEE - COMPLETE 4+ VIEW COMPARISON:  None. FINDINGS: Frontal, bilateral oblique, lateral views of the left knee are obtained. No fracture, subluxation, or dislocation.  Joint spaces are well preserved. No joint effusion. IMPRESSION: 1. Unremarkable left knee. Electronically Signed   By: Randa Ngo M.D.   On: 06/12/2021 18:02    Procedures Procedures   Medications Ordered in ED Medications  OLANZapine zydis (ZYPREXA) disintegrating tablet 10 mg (10 mg Oral Given 06/13/21 1828)  acetaminophen (TYLENOL) tablet 500 mg (500 mg Oral Given 06/13/21 1828)    ED Course  I have reviewed the triage vital signs and the nursing notes.  Pertinent labs & imaging results that were available during my care of the patient were reviewed by me and considered in my medical decision making (see chart for details).    MDM Rules/Calculators/A&P                           50 year old lady presents to ER under IVC.  Multiple ER visits recently and patient was suspected to be malingering.  Given her current appearance however, I believe she would benefit from psychiatric evaluation.  Basic labs today are stable.  She had recent head imaging that was negative.  Patient is medically cleared for psychiatric evaluation.  Will place psych hold orders while awaiting TTS eval.  Final Clinical Impression(s) / ED Diagnoses Final diagnoses:  Paranoia [facility])    Rx / DC Orders ED Discharge Orders     None        Lucrezia Starch, MD 06/13/21 [phone]

## 2021-06-13 NOTE — ED Notes (Signed)
Unable to obtain vital signs at this time pt uncooperative

## 2021-06-13 NOTE — Progress Notes (Signed)
CSW spoke with legal guardian requesting psychiatric evaluation and return to ED. CSW will submit more detailed note but at this time guardian is making this request and may request IVC. Per guardian requested IVC Wednesday but LEO unable to locate patient. Guardian is aware psych may clear which will result in a discharge.

## 2021-06-13 NOTE — ED Notes (Signed)
Patient currently resting quietly with eyes closed.  Sitter at bedside

## 2021-06-14 DIAGNOSIS — F600 Paranoid personality disorder: Secondary | ICD-10-CM | POA: Diagnosis not present

## 2021-06-14 MED ORDER — STERILE WATER FOR INJECTION IJ SOLN
INTRAMUSCULAR | Status: AC
  Filled 2021-06-14: qty 10

## 2021-06-14 MED ORDER — OLANZAPINE 5 MG PO TABS
5.0000 mg | ORAL_TABLET | Freq: Every day | ORAL | Status: DC
  Filled 2021-06-14 (×2): qty 1

## 2021-06-14 NOTE — Progress Notes (Signed)
Per Kaitlyn Duncan , patient meets criteria for inpatient treatment. There are no available or appropriate beds at [facility] today. CSW faxed referrals to the following facilities for review:  [facility]  Pending - No Request Sent N/A [address] [place] [address] [phone] --  [facility]  Pending - No Request Sent N/A [address], [place] [address] [phone] --  [facility]  Pending - No Request [facility] [address] [place] [address] [phone] --  [facility]  Pending - No Request Sent N/A [address] [place] [address] [phone] --  [facility]  Pending - No Request Sent N/A [address] [place], [place] [address] [phone] --  [facility]  Pending - No Request Sent N/A [address] [place] [address] [place] --  [facility]  Pending - No Request Sent N/A [address] [place] [address] [facility]  Pending - No Request Sent N/A [address] [place] [address] [phone] --  [facility]  Pending - No Request Sent N/A [address] [place] [address] [phone] --  [facility]  Pending - No Request Sent N/A [address] [place] [address] [phone] --  [facility]  Pending - No Request Sent N/A [place], [place] [address] [place] --  [place]  Pending - No Request Sent N/A [address] [place] [address] [phone] --  [facility]  Pending - No Request Sent N/A [address], [place] [address] [phone] --   [facility]  Pending - No Request Sent N/A [address] [place] [address] [phone] --  [facility]  Pending - No Request Sent N/A [address] [place] [address] [phone] --    TTS will continue to seek bed placement.  Glennie [place], MSW, [place], LCAS-A Phone: [phone] Disposition/TOC

## 2021-06-14 NOTE — ED Notes (Signed)
Patient sleeping at this time.

## 2021-06-14 NOTE — ED Notes (Signed)
Patient in ambulatory to nurses station requesting to have her personal items so she can leave. Patient educated that she is still IVCd and unable to leave. Patient back to bed.

## 2021-06-14 NOTE — ED Notes (Signed)
Patient sitting comfortably at this time. Ambulatory in hallway. Calm and cooperative.

## 2021-06-14 NOTE — ED Notes (Signed)
PT was trying to leave and made there way into lobby, before being escorted back to bed, security is with PT

## 2021-06-14 NOTE — ED Notes (Signed)
Patient requesting medication for leg pain.  When asked if she had a headache, patient stated "I have physical problems.  Nothing else."
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
## 2021-06-14 NOTE — ED Notes (Signed)
Pt asking about plan of care and belongings. Pt educated that her belongings are locked in a locker where they are safe and that we are working on finding placement for her. Pt reports that she needs a bed upstair or to have her belongings and leave. Pt educated that she is not allowed to leave but we are working diligently to try to find proper placement. Pt educated that she is under court order IVC. Pt increasingly agitated and restless

## 2021-06-14 NOTE — ED Notes (Signed)
Patient resting quietly with eyes closed.  Waiting for TTS evaluation once more alert

## 2021-06-15 ENCOUNTER — Encounter (HOSPITAL_COMMUNITY): Payer: Self-pay | Admitting: Registered Nurse

## 2021-06-15 DIAGNOSIS — Z9189 Other specified personal risk factors, not elsewhere classified: Secondary | ICD-10-CM

## 2021-06-15 DIAGNOSIS — Z765 Malingerer [conscious simulation]: Secondary | ICD-10-CM

## 2021-06-15 DIAGNOSIS — F22 Delusional disorders: Secondary | ICD-10-CM

## 2021-06-15 NOTE — ED Notes (Signed)
Patient continues to refuse vitals at this time.

## 2021-06-15 NOTE — Progress Notes (Signed)
CSW received a call back from legal guardian [name]. CSW and RNCM made her aware of the follow-up appointment with PCP on 12/14. [name] got upset and stated she wanted the appointment with someone else. RNCM explained that in our system her Primary is Dr. Criss Rosales so if she wanted that changed she would have to establish another PCP for her client. [name] stated that she is over this conversation and that we can just e-mail her the medical records and discharge summary and then hung up the phone before giving her email address. CSW was also not able to let her know Psychiatry reached out to her but she does not have a voicemail setup.   CSW contacted program manager Kevin Fenton about Ms. Thompson response. CSW notified them that patient is psych cleared and being discharged.

## 2021-06-15 NOTE — Progress Notes (Signed)
CSW called the legal guardian (Costilla) back to ask what PCP her client goes to. [name] did not know that information and stated she has been working with patient since September and she is not always cooperative. [name] stated she would call CSW back.

## 2021-06-15 NOTE — ED Notes (Signed)
TTS in progress 

## 2021-06-15 NOTE — Progress Notes (Signed)
Legal guardian also mentioned that she could send patient to a hotel but felt she was not given enough time. CSW stated is she has a motel in mind they can give her transportation to get there. Legal guardian stated that she is not near the office and would not be able to do that. CSW informed her that shelter resources would be given

## 2021-06-15 NOTE — ED Provider Notes (Signed)
Spoke with the Education officer, museum after her evaluation.  She has reached out to the state for additional collateral and to make them aware that she has been psychiatrically clear.  [name] is aware. Patient is ready for discharge at this time.   Margette Fast, MD 06/15/21 [phone]

## 2021-06-15 NOTE — ED Notes (Signed)
Attempted to call legal guardian to inform them of the patient's official discharge and that the patient has left the building. No answer and voicemail not set up.

## 2021-06-15 NOTE — ED Notes (Signed)
Breakfast order placed ?
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
## 2021-06-15 NOTE — Consult Note (Signed)
Telepsych Consultation   Reason for Consult:  Wanting medical admission Referring Physician:  Bing Matter Location of Patient: [facility] ED Location of Provider: Other: [facility]  Patient Identification: Kaitlyn Duncan MRN:  [id number] Principal Diagnosis: Has difficulty accessing primary care provider for most visits Diagnosis:  Principal Problem:   Has difficulty accessing primary care provider for most visits Active Problems:   Malingering   Total Time spent with patient: 30 minutes  Subjective:   Kaitlyn Duncan is a 50 y.o. female patient admitted to [facility] ED being discharged then readmitted under IVC petitioned by [facility].   HPI:  Kaitlyn Duncan, 50 y.o., female patient seen via tele health by this provider, consulted with Dr. Hampton Abbot; and chart reviewed on 06/15/21.  On evaluation Kaitlyn Duncan reports she came to the hospital because she had some medical issues that she wanted to have looked at; and that she wanted to be admitted to the hospital.  Patient reports now she is being held against her will and nobody has told her anything that is going on. "I've been in the hospital for 3 days and it should have been put in a room by mail but being a legally didn't pain without any communication.  I am just sitting around ideally."  Patient reporting multiple medical issues such as problems with her GI, a red spot on her inner left thigh, swelling in her legs and feet, dryness to her legs, and pain in the top of her neck and its shoulders.  Patient reports that she no longer has a primary care physician because her physician left.  Reports that her primary care doctor could have addressed her issues but she has been coming to the ED trying to get admitted for to somebody could not lease take care of them.  Patient states she lives alone and does not have a guardian.  Patient then asked who Kaitlyn Duncan was and she states "Somebody with DSS.  But  I don't need a guardian."  Patient denies suicidal/self-harm/homicidal ideations, auditory/visual hallucinations, and paranoid.  Patient does say "I'm a very spiritual being and I hear the spirits, and I have a personal relationship with [name].  Patient asked to her primary support system was and she reports the church but does not elaborate on which church. Patient denies any outpatient psychiatric services.  Informed patient would order a social work consult to assist her with primary care provider.  I also discussed [place] and wellness and informed would also give her the information on her discharge papers. During evaluation Kaitlyn Duncan is sitting up in bed in no acute distress.  She is alert, oriented x 4, calm and cooperative.  Her mood is anxious but euthymic with congruent affect.  She does not appear to be responding to internal/external stimuli or delusional thoughts.  Patient denies suicidal/self-harm/homicidal ideation, psychosis, and paranoia.  Patient answered question appropriately.  Past Psychiatric History: Patient denies prior psychiatric history  Risk to Self: No Risk to Others: No Prior Inpatient Therapy: Denies Prior Outpatient Therapy: Denies  Past Medical History:  Past Medical History:  Diagnosis Date   Allergy    Anemia    Anemia    Diabetes mellitus without complication ([place])    prediabetic   GERD (gastroesophageal reflux disease)    melanoma with met dz dx'd 01/2018   brain and adrenal   Seasonal allergies     Past Surgical History:  Procedure Laterality Date  BIOPSY  01/28/2018   Procedure: BIOPSY;  Surgeon: Laurence Spates, MD;  Location: [facility];  Service: Endoscopy;;   BREAST BIOPSY Left    ESOPHAGOGASTRODUODENOSCOPY N/A 01/28/2018   Procedure: ESOPHAGOGASTRODUODENOSCOPY (EGD);  Surgeon: Laurence Spates, MD;  Location: [facility];  Service: Endoscopy;  Laterality: N/A;   MOUTH SURGERY     OVARY SURGERY     removed "something"   Family  History:  Family History  Problem Relation Age of Onset   Melanoma Mother    Hypertension Father    Breast cancer Maternal Aunt    Breast cancer Paternal Aunt    Diabetes Paternal Aunt    Breast cancer Maternal Grandmother    Breast cancer Paternal Grandmother    Diabetes Paternal Grandmother    Colon cancer Neg Hx    Esophageal cancer Neg Hx    Pancreatic cancer Neg Hx    Stomach cancer Neg Hx    Liver disease Neg Hx    Rectal cancer Neg Hx    Family Psychiatric  History: Denies Social History:  Social History   Substance and Sexual Activity  Alcohol Use No     Social History   Substance and Sexual Activity  Drug Use No    Social History   Socioeconomic History   Marital status: Single    Spouse name: Not on file   Number of children: Not on file   Years of education: Not on file   Highest education level: Not on file  Occupational History   Not on file  Tobacco Use   Smoking status: Never   Smokeless tobacco: Never  Vaping Use   Vaping Use: Never used  Substance and Sexual Activity   Alcohol use: No   Drug use: No   Sexual activity: Not Currently  Other Topics Concern   Not on file  Social History Narrative   Not on file   Social Determinants of Health   Financial Resource Strain: Not on file  Food Insecurity: Not on file  Transportation Needs: Not on file  Physical Activity: Not on file  Stress: Not on file  Social Connections: Not on file   Additional Social History:    Allergies:  No Known Allergies  Labs:  Results for orders placed or performed during the hospital encounter of 06/13/21 (from the past 48 hour(s))  Comprehensive metabolic panel     Status: Abnormal   Collection Time: 06/13/21  2:42 PM  Result Value Ref Range   Sodium 136 135 - 145 mmol/L   Potassium 3.4 (L) 3.5 - 5.1 mmol/L   Chloride 103 98 - 111 mmol/L   CO2 26 22 - 32 mmol/L   Glucose, Bld 119 (H) 70 - 99 mg/dL    Comment: Glucose reference range applies only to  samples taken after fasting for at least 8 hours.   BUN 18 6 - 20 mg/dL   Creatinine, Ser 0.49 0.44 - 1.00 mg/dL   Calcium 8.7 (L) 8.9 - 10.3 mg/dL   Total Protein 6.5 6.5 - 8.1 g/dL   Albumin 3.7 3.5 - 5.0 g/dL   AST 24 15 - 41 U/L   ALT 10 0 - 44 U/L   Alkaline Phosphatase 71 38 - 126 U/L   Total Bilirubin 0.4 0.3 - 1.2 mg/dL   GFR, Estimated >60 >60 mL/min    Comment: (NOTE) Calculated using the CKD-EPI Creatinine Equation (2021)    Anion gap 7 5 - 15    Comment: Performed at [facility], [place] [address], [place], [place] [address]  Ethanol     Status: None   Collection Time: 06/13/21  2:42 PM  Result Value Ref Range   Alcohol, Ethyl (B) <10 <10 mg/dL    Comment: (NOTE) Lowest detectable limit for serum alcohol is 10 mg/dL.  For medical purposes only. Performed at [facility], [place] [address], [place], [place] [address]   Salicylate level     Status: Abnormal   Collection Time: 06/13/21  2:42 PM  Result Value Ref Range   Salicylate Lvl <8.4 (L) 7.0 - 30.0 mg/dL    Comment: Performed at [facility] [address] [place], [place] [address]  Acetaminophen level     Status: Abnormal   Collection Time: 06/13/21  2:42 PM  Result Value Ref Range   Acetaminophen (Tylenol), Serum <10 (L) 10 - 30 ug/mL    Comment: (NOTE) Therapeutic concentrations vary significantly. A range of 10-30 ug/mL  may be an effective concentration for many patients. However, some  are best treated at concentrations outside of this range. Acetaminophen concentrations >150 ug/mL at 4 hours after ingestion  and >50 ug/mL at 12 hours after ingestion are often associated with  toxic reactions.  Performed at [facility], [place] [address] [place], [place] [address]   cbc     Status: Abnormal   Collection Time: 06/13/21  2:42 PM  Result Value Ref Range   WBC 5.3 4.0 - 10.5 K/uL   RBC 4.16 3.87 - 5.11 MIL/uL   Hemoglobin 11.5 (L) 12.0 - 15.0 g/dL   HCT 36.4 36.0 - 46.0 %    MCV 87.5 80.0 - 100.0 fL   MCH 27.6 26.0 - 34.0 pg   MCHC 31.6 30.0 - 36.0 g/dL   RDW 13.1 11.5 - 15.5 %   Platelets 46 (L) 150 - 400 K/uL    Comment: Immature Platelet Fraction may be clinically indicated, consider ordering this additional test [id number] REPEATED TO VERIFY PLATELET COUNT CONFIRMED BY SMEAR    nRBC 0.0 0.0 - 0.2 %    Comment: Performed at [facility], [place] [address], [place],  [address]  I-Stat beta hCG blood, ED     Status: None   Collection Time: 06/13/21  3:08 PM  Result Value Ref Range   I-stat hCG, quantitative <5.0 <5 mIU/mL   Comment 3            Comment:   GEST. AGE      CONC.  (mIU/mL)   <=1 WEEK        5 - 50     2 WEEKS       50 - 500     3 WEEKS       100 - 10,000     4 WEEKS     1,000 - 30,000        FEMALE AND NON-PREGNANT FEMALE:     LESS THAN 5 mIU/mL   Resp Panel by RT-PCR (Flu A&B, Covid) Nasopharyngeal Swab     Status: None   Collection Time: 06/13/21  6:31 PM   Specimen: Nasopharyngeal Swab; Nasopharyngeal(NP) swabs in vial transport medium  Result Value Ref Range   SARS Coronavirus 2 by RT PCR NEGATIVE NEGATIVE    Comment: (NOTE) SARS-CoV-2 target nucleic acids are NOT DETECTED.  The SARS-CoV-2 RNA is generally detectable in upper respiratory specimens during the acute phase of infection. The lowest concentration of SARS-CoV-2 viral copies this  assay can detect is 138 copies/mL. A negative result does not preclude SARS-Cov-2 infection and should not be used as the sole basis for treatment or other patient management decisions. A negative result may occur with  improper specimen collection/handling, submission of specimen other than nasopharyngeal swab, presence of viral mutation(s) within the areas targeted by this assay, and inadequate number of viral copies(<138 copies/mL). A negative result must be combined with clinical observations, patient history, and epidemiological information. The expected result is  Negative.  Fact Sheet for Patients:  EntrepreneurPulse.com.au  Fact Sheet for Healthcare Providers:  IncredibleEmployment.be  This test is no t yet approved or cleared by the [place] FDA and  has been authorized for detection and/or diagnosis of SARS-CoV-2 by FDA under an Emergency Use Authorization (EUA). This EUA will remain  in effect (meaning this test can be used) for the duration of the COVID-19 declaration under Section 564(b)(1) of the Act, 21 U.S.C.section 360bbb-3(b)(1), unless the authorization is terminated  or revoked sooner.       Influenza A by PCR NEGATIVE NEGATIVE   Influenza B by PCR NEGATIVE NEGATIVE    Comment: (NOTE) The Xpert Xpress SARS-CoV-2/FLU/RSV plus assay is intended as an aid in the diagnosis of influenza from Nasopharyngeal swab specimens and should not be used as a sole basis for treatment. Nasal washings and aspirates are unacceptable for Xpert Xpress SARS-CoV-2/FLU/RSV testing.  Fact Sheet for Patients: EntrepreneurPulse.com.au  Fact Sheet for Healthcare Providers: IncredibleEmployment.be  This test is not yet approved or cleared by the [place] FDA and has been authorized for detection and/or diagnosis of SARS-CoV-2 by FDA under an Emergency Use Authorization (EUA). This EUA will remain in effect (meaning this test can be used) for the duration of the COVID-19 declaration under Section 564(b)(1) of the Act, 21 U.S.C. section 360bbb-3(b)(1), unless the authorization is terminated or revoked.  Performed at [facility], [place] [address] [place], [place] [address]     Medications:  Current Facility-Administered Medications  Medication Dose Route Frequency Provider Last Rate Last Admin   acetaminophen (TYLENOL) tablet 500 mg  500 mg Oral Once Kaitlyn Starch, MD       acetaminophen (TYLENOL) tablet 650 mg  650 mg Oral Q4H PRN Kaitlyn Starch, MD    650 mg at 06/14/21 1954   OLANZapine zydis (ZYPREXA) disintegrating tablet 10 mg  10 mg Oral Q8H PRN Kaitlyn Starch, MD       And   LORazepam (ATIVAN) tablet 1 mg  1 mg Oral PRN Kaitlyn Starch, MD       OLANZapine (ZYPREXA) tablet 5 mg  5 mg Oral QHS Kaitlyn Lot E, NP       ondansetron (ZOFRAN) tablet 4 mg  4 mg Oral Q8H PRN Kaitlyn Starch, MD       Current Outpatient Medications  Medication Sig Dispense Refill   acetaminophen (TYLENOL) 325 MG tablet Take 2 tablets (650 mg total) by mouth every 6 (six) hours as needed. (Patient not taking: Reported on 06/15/2021) 30 tablet 0   hydrocortisone cream 1 % Apply to affected area 2 times daily (Patient not taking: Reported on 06/15/2021) 15 g 0   simethicone (MYLICON) 40 TI/1.4ER drops Take 0.6 mLs (40 mg total) by mouth 4 (four) times daily as needed for flatulence. (Patient not taking: Reported on 06/15/2021) 30 mL 0   trimethoprim-polymyxin b (POLYTRIM) ophthalmic solution Place 1 drop into the left eye every 4 (four) hours. (Patient not taking:  Reported on 06/15/2021) 10 mL 0    Musculoskeletal: Strength & Muscle Tone: within normal limits Gait & Station: normal Patient leans: N/A          Psychiatric Specialty Exam:  Presentation  General Appearance: Appropriate for Environment  Eye Contact:Good  Speech:Clear and Coherent; Normal Rate  Speech Volume:Normal  Handedness:Right   Mood and Affect  Mood:Anxious; Euthymic  Affect:Appropriate; Congruent   Thought Process  Thought Processes:Coherent; Goal Directed  Descriptions of Associations:Intact  Orientation:Full (Time, Place and Person)  Thought Content:Rumination  History of Schizophrenia/Schizoaffective disorder:No  Duration of Psychotic Symptoms:N/A  Hallucinations:Hallucinations: None  Ideas of Reference:None  Suicidal Thoughts:Suicidal Thoughts: No  Homicidal Thoughts:Homicidal Thoughts: No   Sensorium  Memory:Immediate Good;  Recent Good  Judgment:Intact  Insight:Fair; Present   Executive Functions  Concentration:Good  Attention Span:Good  [place] of Knowledge:Good  Language:Good   Psychomotor Activity  Psychomotor Activity:Psychomotor Activity: Normal   Assets  Assets:Communication Skills; Desire for Improvement; Financial Resources/Insurance; Housing   Sleep  Sleep:Sleep: Good    Physical Exam: Physical Exam Vitals and nursing note reviewed. Exam conducted with a chaperone present.  Constitutional:      General: She is not in acute distress.    Appearance: Normal appearance. She is not ill-appearing.  Cardiovascular:     Rate and Rhythm: Normal rate.  Pulmonary:     Effort: Pulmonary effort is normal.  Neurological:     Mental Status: She is alert and oriented to person, place, and time.  Psychiatric:        Attention and Perception: Attention and perception normal. She does not perceive auditory or visual hallucinations.        Mood and Affect: Affect normal. Mood is anxious.        Speech: Speech normal.        Behavior: Behavior normal. Behavior is cooperative.        Thought Content: Thought content normal. Thought content is not paranoid or delusional. Thought content does not include homicidal or suicidal ideation.        Cognition and Memory: Cognition normal.   Review of Systems  Constitutional: Negative.   HENT: Negative.    Eyes: Negative.   Respiratory: Negative.    Cardiovascular:  Negative for chest pain and palpitations. Leg swelling: I've got swelling in my legs and feet". Gastrointestinal: Negative.   Genitourinary:        I got issues with my GI track.  Would not elaborate what problems she had  Musculoskeletal:  Neck pain: "I got pain to p of my neck and shoulders".  Skin:  Rash: I have a red spot on my inner left thigh.  my legs are dry.  Neurological: Negative.   Endo/Heme/Allergies: Negative.   Psychiatric/Behavioral:  Depression: Stable.  Hallucinations: Denies. Substance abuse: Denies. Suicidal ideas: Denies. Nervous/anxious: Stable.        Patient stating that she no longer has a primary care provider and that she has come to the emergency room several times for admission so that a doctor can handle her medical issues.  Patient then names off several medical issues that she has to have looked and and handled.    Blood pressure 127/80, pulse 76, temperature 98 F (36.7 C), temperature source Oral, resp. rate 15, SpO2 99 %. There is no height or weight on file to calculate BMI.  Treatment Plan Summary: Plan psychiatrically clear to follow-up with resources given  Social work/TOC Consult ordered:  Patient stating she has no  primary care doctor because left.  Multiple ED visits wanting to be admitted for medical issues.  Reports if she has a PCP to see it would help and be better.     Disposition: No evidence of imminent risk to self or others at present.   Patient does not meet criteria for psychiatric inpatient admission. Supportive therapy provided about ongoing stressors. Discussed crisis plan, support from social network, calling 911, coming to the Emergency Department, and calling Suicide Hotline.  This service was provided via telemedicine using a 2-way, interactive audio and video technology.  Names of all persons participating in this telemedicine service and their role in this encounter. Name: Earleen Newport Role: NP  Name: Dr. Hampton Abbot Role: Psychiatrist  Name: Kaitlyn Duncan Role: Patient  Name:  Role:    Secure message sent to patient's nurse Donah Driver, RN; behavioral health coordinator, and social work: Psychiatric consult complete and patient has been psychiatrically cleared.  Patient presenting to the ED multiple visits with medical complaints.  Patient reporting that she no longer has a primary care physician and agrees that if had a primary care provider they could assess her medical complaints.   Consult sent out to social work/TOC to assist patient with primary care resources.  Behavioral health coordinator can give resources for outpatient psychiatric services.  Please inform MD only default listed.  Perri Aragones, NP 06/15/2021 12:39 PM

## 2021-06-15 NOTE — ED Notes (Signed)
Pt refused vital signs.

## 2021-06-15 NOTE — ED Notes (Signed)
Patient given belongings; patient signed paperwork.

## 2021-06-15 NOTE — Discharge Instructions (Signed)
[place], [place]  [address] Get Driving Directions Main: [phone] Fax: [phone] Sunday: Closed Monday: 8:30 AM - 5:30 PM Tuesday: 8:30 AM - 5:30 PM Wednesday: 8:30 AM - 5:30 PM Thursday: 8:30 AM - 5:30 PM Friday: 8:30 AM - 5:30 PM Saturday: Closed Closed: New Year's Day, Good Friday, Memorial Day, July 4th, Labor Day, Thanksgiving Day, [place] Friday, Christmas Eve, Christmas Day, New Year's Eve (we close early) [employer] is a Production manager. We provide integrated care in an effort to achieve positive patient experiences and improve patient health care outcomes. Our team is here to support patients in their wellness journeys as well as to provide chronic disease management, lab testing, health education and much more. Primary Care Services Chronic disease management Disease prevention, diagnosis and treatment On-site point-of-care laboratory testing Health education and prevention programs Physicals and immunizations Our on-site resources include:  Behavioral health Social work Case management Medication management Financial assistance Pharmacy Laboratory Stratus machines for interpreting needs  Same-Day & [place] has same-day and walk-in appointments available for established patients only. Walk-in appointments are available on Wednesday and Thursday from 8:30 am to 11:00 AM and from 1:30 AM to 4:00 PM. Same-day appointments are available daily from 8:30 AM to 4:15 PM.    Resource Guide 1) Find a Doctor and Pay Out of Pocket Although you won't have to find out who is covered by your insurance plan, it is a good idea to ask around and get recommendations. You will then need to call the office and see if the doctor you have chosen will accept you as a new patient and what types of options they offer for patients who are  self-pay. Some doctors offer discounts or will set up payment plans for their patients who do not have insurance, but you will need to ask so you aren't surprised when you get to your appointment.  2) Contact Your Local Health Department Not all health departments have doctors that can see patients for sick visits, but many do, so it is worth a call to see if yours does. If you don't know where your local health department is, you can check in your phone book. The CDC also has a tool to help you locate your state's health department, and many state websites also have listings of all of their local health departments.  3) Find a [facility] If your illness is not likely to be very severe or complicated, you may want to try a walk in clinic. These are popping up all over the country in pharmacies, drugstores, and shopping centers. They're usually staffed by nurse practitioners or physician assistants that have been trained to treat common illnesses and complaints. They're usually fairly quick and inexpensive. However, if you have serious medical issues or chronic medical problems, these are probably not your best option.  No Primary Care Doctor: Call Health Connect at  [phone] - they can help you locate a primary care doctor that  accepts your insurance, provides certain services, etc. Physician Referral Service- [phone]  Chronic Pain Problems: Organization         Address  Phone   Notes  [facility]  [phone] Patients need to be referred by their primary care doctor.   Medication Assistance: Organization         Address  Phone   Notes  [facility] Medication Assistance  Program [address] [place], [place] [address] [phone] --Must be a resident of [facility] -- Must have NO insurance coverage whatsoever (no Medicaid/ Medicare, etc.) -- The pt. MUST have a primary care doctor that directs their care regularly and follows them in the  community   MedAssist  [phone]   [employer]  [phone]    Agencies that provide inexpensive medical care: Organization         Address  Phone   Notes  [place]  [phone]   [facility]    [phone]   [facility] [place], [place] [address] [phone]   [place] [address] [place] [phone]   Planned Parenthood    [phone]   [facility]    [phone]   [place] and [place] [address], [place] Phone:  [phone], Fax:  [phone] Hours of Operation:  9 am - 6 pm, M-F.  Also accepts Medicaid/Medicare and self-pay.  [facility] [place] [address], [address], [place] Phone: [phone], Fax: [phone]. Hours of Operation:  8:30 am - 5:30 pm, M-F.  Also accepts Medicaid and self-pay.  [facility] [place] [address] [place] Phone: [phone]   [place], [place], [place] [phone], Ext. [phone] Mondays & Thursdays: 7-9 AM.  First 15 patients are seen on a first come, first serve basis.    [place] Providers:  Organization         Address  Phone   Notes  [facility] [address], [place] [phone] Also accepts self-pay patients.  [facility] [address], [place]  [phone]   [place], [address], [place] [phone]   [facility] [address] [place] [phone]   Lucianne Lei [address], [place]   [phone] Only accepts [place] Access [place] patients after they have their name applied to their card.   Self-Pay (no insurance) in [facility]:  Organization         Address  Phone   Notes  Sickle Cell Patients, [facility]  Internal Medicine [place] [phone]   [facility] [place] [phone]   [facility] [place]  [place], [place], [place] [phone]   [facility]/Dr. Osei-Bonsu  [address] [place] or [address], [place] [phone] Phone number for both [place] and [place] locations is the same.  [facility] [address], [place] [phone]   [facility] [address] [place] or [address] [phone]   [facility] [address], [place] [phone], phone; [phone], fax Sees patients 1st and 3rd Saturday of every month.  Must not qualify for public or private insurance (i.e. Medicaid, Medicare, [place] Health Choice, Veterans' Benefits)  Household income should be no more than 200% of the poverty level The clinic cannot treat you if you are pregnant or think you are pregnant  Sexually transmitted diseases are not treated at the  clinic.    Dental Care: Organization         Address  Phone  Notes  [facility] Department of [facility] [place] [phone] Accepts children up to age 32 who are enrolled in [place] or [place]; pregnant women with a Medicaid card; and children who have applied for Medicaid or [place] Health Choice, but were declined, whose parents can pay a reduced fee at time of service.  [facility] Department of [facility]  [address], [place] [phone] Accepts children up to age 20 who are enrolled in [place] or [place]; pregnant women with a Medicaid card; and children who have applied for Medicaid or [place] Health Choice, but were declined, whose parents can pay a reduced fee at time of service.  [place] Adult Dental Access PROGRAM  [place] [phone] Patients are seen by appointment only. Walk-ins are not accepted. [place] will see patients 92 years of age and older. Monday - Tuesday (8am-5pm) Most Wednesdays (8:30-5pm) $30 per visit, cash only  [facility] Adult [facility] Access PROGRAM  [address], [facility] [phone] Patients are seen by appointment only. Walk-ins are not accepted. [place] will see patients 24 years of age and older. One Wednesday Evening (Monthly: Volunteer Based).  $30 per visit, cash only  [place]  [phone] for adults; Children under age 60, call Graduate Pediatric Dentistry at [phone]. Children aged 53-14, please call [phone] to request a pediatric application.  Dental services are provided in all areas of dental care including fillings, crowns and bridges, complete and partial dentures, implants, gum treatment, root canals, and extractions. Preventive care is also provided. Treatment is provided to both adults and children. Patients are selected via a lottery and there is often a waiting list.   [facility] [address] [place]  [phone] [email or web address]   [facility] [address] [place], [place] [phone], Ext. [phone] Second and Fourth Thursday of each month, opens at 6:30 AM; Clinic ends at 9 AM.  Patients are seen on a first-come first-served basis, and a limited number are seen during each clinic.   [facility]  [address] [place] Danker [place], [place] [phone]   Eligibility Requirements You must have lived in [place], [place], or [place] counties for at least the last three months.   You cannot be eligible for state or federal sponsored [employer], including [employer], [place], or [employer].   You generally cannot be eligible for healthcare insurance through your employer.    How to apply: Eligibility screenings are held every  Tuesday and Wednesday afternoon from 1:00 pm until 4:00 pm. You do not need an appointment for the interview!  [facility] [address] [place], [place]   [place]  [place] Department  [place]  [phone]    Behavioral Health Resources in the Community: Intensive Outpatient Programs Organization         Address  Phone  Notes  [place]. [address] [place], [place] [phone]   [facility] Outpatient [address] [place], [place]   ADS: Alcohol [facility] Drug S[facility] [address], [place], [place]   [place]  [address]  [place], [place] or [phone]   Substance Abuse Resources Organization         Address  Phone  Notes  Alcohol and Drug Services  9783108376   [place]  [phone]   [facility] [place]   Chinita Pester  3130276367   Residential & Outpatient Substance Abuse Program  [phone]   Psychological Services Organization         Address  Phone  Notes  [facility] [place]  [place]  [phone]   [place] [address], [place] or [phone]    Mobile Crisis Teams Organization         Address  Phone  Notes  [facility], Mobile Crisis Care Unit  [phone]   [facility] Psychotherapeutic Services  [address] [place], [place]   [place] [address] [place] [phone]    Self-Help/Support Groups Organization         Address  Phone             Notes  [place]. of [place] - variety of support groups  [place] Call for more information  Narcotics Anonymous (NA), [facility] [address], [employer] [place]  2 meetings at this location    Special educational needs teacher         Address  Phone  Notes  ASAP Residential Treatment [place],    [place]  1-3142824058   [facility]  [address] [place] [phone], [place], [place]   [place], [place] [phone] Admissions: 8am-3pm M-F  Incentives Substance [name] [address]    [place], [place] [phone]   [facility] [address] [place], [place], [place]   [facility] [address]  [place], [place]   [facility] Programs - Intensive Outpatient [address] [place]een [address], [place], [place]   [facility] ([place].,  [place], [place] [phone] or [phone]   [facility]idential Treatment Services (RTS) [address] [employer], [place] Accepts Medicaid  [facility] [place] [address]  [place] 1-810-407-2789 Substance Abuse/Addiction Treatment   [facility] Organization         Address  Phone  Notes  [facility]  [phone]   Domenic Schwab, PhD [address] [place], [place]   [phone] or [phone]   [place] We[place], [place] [phone]   [facility] [address], [place], [place] [phone] Insurance/Medicaid/sponsorship through [facility] and Families [address], [address]                                    [place], [place] [phone] [place] [address], [place] [phone]    Dr. Adele Schilder  [phone]   [facility] Dept. 1) [address] [place] 2) [place] 3)  [place] [address], [place] [phone]  [phone]   [place] [phone] or [phone] (After  Hours)

## 2021-06-15 NOTE — ED Notes (Signed)
Patient's legal guardian, Kaitlyn Duncan, called to return phone call. Kaitlyn Duncan states that she spoke with [facility] and requested some time to find a place for the patient to go (i.e. hotel, etc.) and expressed frustration with this request not being granted. Kaitlyn Duncan requesting psychiatry to call her at [phone] regarding the patient's evaluation and discharge. Psychiatry messaged with Kaitlyn Duncan's contact info and request. Kaitlyn Duncan also requested a copy of the AVS to be faxed to [phone].

## 2021-06-15 NOTE — Progress Notes (Signed)
TOC team spoke with legal guardian to confirm pt. has seen Dr. Criss Rosales previously for PCP services. RNCM spoke with Dr. Fransico Setters office to confirm pt is their pt. RNCM scheduled follow up appt 06/17/21 at 2:45 pm. Information is on discharge instructions.

## 2021-06-15 NOTE — ED Notes (Signed)
Pt ambulated to the bathroom.  

## 2021-06-15 NOTE — Discharge Planning (Signed)
RNCM spoke with pt. at bedside to coordinate PCP appt. RNCM advised pt to see the medical team at the [facility], due to no perm residence. Pt. advised she no longer sees Dr. Criss Rosales. RNCM provided pt. with bus voucher to go to her appt.

## 2021-06-15 NOTE — Progress Notes (Signed)
CSW notified patients guardian of the possible psych clearance and discharge. Patients guardian is Kaitlyn Duncan with [facility]. Ms. Kaitlyn Duncan stated patient is homeless and she would like to be notified by psychiatry about the evaluation that was completed. CSW sent a secure chat to NP, [place] with guardians information. CSW also notified disposition social worker that the guardian is requesting to speak with psychiatry. CSW told the guardian that patient will be discharged when medically and psych cleared. Patients guardian was upset and stated she was given enough time to plan where patient was going to go. Patients guardian stated she will be at square one and patient will be back. Ms. Kaitlyn Duncan [facility] [place].

## 2021-06-15 NOTE — ED Notes (Signed)
AVS faxed over to legal guardian.

## 2021-06-15 NOTE — ED Notes (Signed)
Patient given discharge instructions/education at this time. Informed patient that legal guardian is working on setting up a place for her to go as well as transportation. Informed patient that belongings will be returned to her shortly.

## 2021-06-15 NOTE — ED Notes (Signed)
Patient states she does "not want to wait on the legal guardian for anything and has a bus pass to get where she needs to go." Patient changed into personal clothing and leaving at this time.

## 2021-06-17 ENCOUNTER — Emergency Department (HOSPITAL_COMMUNITY)
Admission: EM | Admit: 2021-06-17 | Discharge: 2021-06-18 | Disposition: A | Discharge status: Home / Self Care | Payer: Medicare Other | Attending: Emergency Medicine | Admitting: Emergency Medicine

## 2021-06-17 ENCOUNTER — Encounter (HOSPITAL_COMMUNITY): Payer: Self-pay

## 2021-06-17 DIAGNOSIS — D696 Thrombocytopenia, unspecified: Secondary | ICD-10-CM | POA: Insufficient documentation

## 2021-06-17 DIAGNOSIS — E876 Hypokalemia: Secondary | ICD-10-CM | POA: Diagnosis not present

## 2021-06-17 DIAGNOSIS — S0502XA Injury of conjunctiva and corneal abrasion without foreign body, left eye, initial encounter: Secondary | ICD-10-CM | POA: Insufficient documentation

## 2021-06-17 DIAGNOSIS — X58XXXA Exposure to other specified factors, initial encounter: Secondary | ICD-10-CM | POA: Diagnosis not present

## 2021-06-17 DIAGNOSIS — Z85841 Personal history of malignant neoplasm of brain: Secondary | ICD-10-CM | POA: Diagnosis not present

## 2021-06-17 DIAGNOSIS — B372 Candidiasis of skin and nail: Secondary | ICD-10-CM | POA: Insufficient documentation

## 2021-06-17 DIAGNOSIS — R0981 Nasal congestion: Secondary | ICD-10-CM | POA: Insufficient documentation

## 2021-06-17 DIAGNOSIS — E119 Type 2 diabetes mellitus without complications: Secondary | ICD-10-CM | POA: Insufficient documentation

## 2021-06-17 DIAGNOSIS — R300 Dysuria: Secondary | ICD-10-CM | POA: Insufficient documentation

## 2021-06-17 DIAGNOSIS — S0990XA Unspecified injury of head, initial encounter: Secondary | ICD-10-CM | POA: Diagnosis present

## 2021-06-17 DIAGNOSIS — M25572 Pain in left ankle and joints of left foot: Secondary | ICD-10-CM | POA: Diagnosis not present

## 2021-06-17 LAB — CBC
HCT: 40.7 % (ref 36.0–46.0)
Hemoglobin: 12.9 g/dL (ref 12.0–15.0)
MCH: 27.9 pg (ref 26.0–34.0)
MCHC: 31.7 g/dL (ref 30.0–36.0)
MCV: 88.1 fL (ref 80.0–100.0)
Platelets: 69 10*3/uL — ABNORMAL LOW (ref 150–400)
RBC: 4.62 MIL/uL (ref 3.87–5.11)
RDW: 13.2 % (ref 11.5–15.5)
WBC: 4.7 10*3/uL (ref 4.0–10.5)
nRBC: 0 % (ref 0.0–0.2)

## 2021-06-17 LAB — BASIC METABOLIC PANEL
Anion gap: 8 (ref 5–15)
BUN: 15 mg/dL (ref 6–20)
CO2: 27 mmol/L (ref 22–32)
Calcium: 8.9 mg/dL (ref 8.9–10.3)
Chloride: 101 mmol/L (ref 98–111)
Creatinine, Ser: 0.57 mg/dL (ref 0.44–1.00)
GFR, Estimated: >60 mL/min (ref >60)
Glucose, Bld: 98 mg/dL (ref 70–99)
Potassium: 3.3 mmol/L — ABNORMAL LOW (ref 3.5–5.1)
Sodium: 136 mmol/L (ref 135–145)

## 2021-06-17 LAB — URINALYSIS, ROUTINE W REFLEX MICROSCOPIC
Bilirubin Urine: NEGATIVE
Glucose, UA: NEGATIVE mg/dL
Ketones, ur: NEGATIVE mg/dL
Leukocytes,Ua: NEGATIVE
Nitrite: NEGATIVE
Protein, ur: NEGATIVE mg/dL
Specific Gravity, Urine: 1.02 (ref 1.005–1.030)
pH: 6.5 (ref 5.0–8.0)

## 2021-06-17 LAB — URINALYSIS, MICROSCOPIC (REFLEX)
Bacteria, UA: NONE SEEN
WBC, UA: NONE SEEN WBC/hpf (ref 0–5)

## 2021-06-17 LAB — I-STAT BETA HCG BLOOD, ED (MC, WL, AP ONLY): I-stat hCG, quantitative: <5 m[IU]/mL (ref <5)

## 2021-06-17 MED ORDER — ERYTHROMYCIN 5 MG/GM OP OINT
TOPICAL_OINTMENT | Freq: Once | OPHTHALMIC | Status: AC
  Administered 2021-06-18: 1 via OPHTHALMIC
  Filled 2021-06-17: qty 3.5

## 2021-06-17 MED ORDER — CLOTRIMAZOLE 1 % EX CREA
TOPICAL_CREAM | Freq: Two times a day (BID) | CUTANEOUS | Status: DC
  Administered 2021-06-18: 1 via TOPICAL
  Filled 2021-06-17: qty 15

## 2021-06-17 MED ORDER — POTASSIUM CHLORIDE CRYS ER 20 MEQ PO TBCR
40.0000 meq | EXTENDED_RELEASE_TABLET | Freq: Once | ORAL | Status: AC
  Administered 2021-06-18: 40 meq via ORAL
  Filled 2021-06-17: qty 2

## 2021-06-17 MED ORDER — FLUORESCEIN SODIUM 1 MG OP STRP
1.0000 | ORAL_STRIP | Freq: Once | OPHTHALMIC | Status: AC
  Administered 2021-06-17: 1 via OPHTHALMIC
  Filled 2021-06-17: qty 1

## 2021-06-17 NOTE — ED Triage Notes (Signed)
Patient arrives with complaint of neck pain, lower back pain, lower abdominal pain, and burning with urination. Pt states she has also been experiencing intermittent headaches. Denies any N/V/D.

## 2021-06-17 NOTE — ED Provider Notes (Addendum)
[facility]-EMERGENCY DEPT Provider Note   CSN: [id number] Arrival date & time: 06/17/21  2115     History Chief Complaint  Patient presents with   Eye Drainage   Rash    Kaitlyn Duncan is a 50 y.o. female with a hx of melanoma with brain metastasis, diabetes mellitus, anemia, paranoia, and malingering who presents to the ED with complaints of left eye irritation, left groin rash, and ankle pain to me.  Patient reports over the past couple of days she has had some irritation to her left eye which she describes as some mild drainage at times itching and somewhat of a FB sensation.  No alleviating or aggravating factors.  Has had some associated nasal congestion.  She is not a contact lens wearer.  She denies diplopia, blurry vision, or loss of vision.  She also mentions a left groin rash that is irritating that she noticed a couple of days ago.  Denies rash in any other location.  States it is not pruritic.  She is not having any pain to the vaginal region.  She is not having any vaginal discharge or bleeding.  She informed triage she was having some dysuria, she denies this to me specifically.  She also mentions that she is having some continued ankle pain, this is been ongoing for a while now, reports there are bruises there, no recent injuries.  She mentions some other areas of pain to the triage staff, denies these to me.  HPI     Past Medical History:  Diagnosis Date   Allergy    Anemia    Anemia    Diabetes mellitus without complication (HCC)    prediabetic   GERD (gastroesophageal reflux disease)    melanoma with met dz dx'd 01/2018   brain and adrenal   Seasonal allergies     Patient Active Problem List   Diagnosis Date Noted   Has difficulty accessing primary care provider for most visits 06/15/2021   Paranoia (HCC)    Malingering 01/23/2021   Candidiasis of skin 01/23/2021   Musculoskeletal pain 01/23/2021   Psychosis (HCC)    Adjustment  disorder with mixed disturbance of emotions and conduct 10/07/2020   Melanoma of skin (HCC) 05/29/2018   Goals of care, counseling/discussion 05/29/2018   Brain metastasis (HCC) 01/26/2018    Past Surgical History:  Procedure Laterality Date   BIOPSY  01/28/2018   Procedure: BIOPSY;  Surgeon: Carman Ching, MD;  Location: [facility];  Service: Endoscopy;;   BREAST BIOPSY Left    ESOPHAGOGASTRODUODENOSCOPY N/A 01/28/2018   Procedure: ESOPHAGOGASTRODUODENOSCOPY (EGD);  Surgeon: Carman Ching, MD;  Location: [facility];  Service: Endoscopy;  Laterality: N/A;   MOUTH SURGERY     OVARY SURGERY     removed "something"     OB History     Gravida  0   Para  0   Term  0   Preterm  0   AB  0   Living  0      SAB  0   IAB  0   Ectopic  0   Multiple  0   Live Births  0           Family History  Problem Relation Age of Onset   Melanoma Mother    Hypertension Father    Breast cancer Maternal Aunt    Breast cancer Paternal Aunt    Diabetes Paternal Aunt    Breast cancer Maternal Grandmother  Breast cancer Paternal Grandmother    Diabetes Paternal Grandmother    Colon cancer Neg Hx    Esophageal cancer Neg Hx    Pancreatic cancer Neg Hx    Stomach cancer Neg Hx    Liver disease Neg Hx    Rectal cancer Neg Hx     Social History   Tobacco Use   Smoking status: Never   Smokeless tobacco: Never  Vaping Use   Vaping Use: Never used  Substance Use Topics   Alcohol use: No   Drug use: No    Home Medications Prior to Admission medications   Medication Sig Start Date End Date Taking? Authorizing Provider  acetaminophen (TYLENOL) 325 MG tablet Take 2 tablets (650 mg total) by mouth every 6 (six) hours as needed. Patient not taking: Reported on 06/15/2021 06/12/21   Jaynee Eagles, PA-C  hydrocortisone cream 1 % Apply to affected area 2 times daily Patient not taking: Reported on 06/15/2021 06/09/21   Hans Eden, NP  simethicone (MYLICON) 40 HU/7.6LY  drops Take 0.6 mLs (40 mg total) by mouth 4 (four) times daily as needed for flatulence. Patient not taking: Reported on 06/15/2021 06/12/21   Jaynee Eagles, PA-C  trimethoprim-polymyxin b [facility]) ophthalmic solution Place 1 drop into the left eye every 4 (four) hours. Patient not taking: Reported on 06/15/2021 06/09/21   Hans Eden, NP    Allergies    Patient has no known allergies.  Review of Systems   Review of Systems  Constitutional:  Negative for chills and fever.  HENT:  Positive for congestion. Negative for sore throat.   Eyes:  Positive for discharge and itching. Negative for photophobia, pain and visual disturbance.  Respiratory:  Negative for cough and shortness of breath.   Cardiovascular:  Negative for chest pain.  Gastrointestinal:  Negative for abdominal pain, nausea and vomiting.  Genitourinary:  Negative for dysuria.  Musculoskeletal:  Positive for arthralgias.  Neurological:  Negative for syncope.  All other systems reviewed and are negative.  Physical Exam Updated Vital Signs BP (!) 135/96 (BP Location: Left Arm)    Pulse 98    Temp 98.4 F (36.9 C) (Oral)    Resp 16    Ht $R'5\' 1"'oi$  (1.549 m)    Wt 55.8 kg    SpO2 99%    BMI 23.24 kg/m   Physical Exam Vitals and nursing note reviewed.  Constitutional:      General: She is not in acute distress.    Appearance: She is well-developed.  HENT:     Head: Normocephalic and atraumatic.     Right Ear: Ear canal normal. Tympanic membrane is not perforated, erythematous, retracted or bulging.     Left Ear: Ear canal normal. Tympanic membrane is not perforated, erythematous, retracted or bulging.     Ears:     Comments: No mastoid erythema/swelling/tenderness.     Nose:     Right Sinus: No maxillary sinus tenderness or frontal sinus tenderness.     Left Sinus: No maxillary sinus tenderness or frontal sinus tenderness.     Mouth/Throat:     Pharynx: Uvula midline. No oropharyngeal exudate or posterior oropharyngeal  erythema.     Comments: Posterior oropharynx is symmetric appearing. Patient tolerating own secretions without difficulty. No trismus. No drooling. No hot potato voice. No swelling beneath the tongue, submandibular compartment is soft.  Eyes:     General:        Right eye: No discharge.  Left eye: No discharge.     Extraocular Movements: Extraocular movements intact.     Conjunctiva/sclera: Conjunctivae normal.     Pupils: Pupils are equal, round, and reactive to light.     Comments: PERRL.  No periorbital erythema/edema/TTP.  Vision grossly intact.  Fluorescein stain performed- 2 mm uptake to the left lower quadrant of the left eye consistent with small corneal abrasion. No ulceration noted, no dendritic staining, negative seidel sign.   Cardiovascular:     Rate and Rhythm: Normal rate and regular rhythm.     Heart sounds: No murmur heard. Pulmonary:     Effort: Pulmonary effort is normal. No respiratory distress.     Breath sounds: Normal breath sounds. No wheezing, rhonchi or rales.  Abdominal:     General: There is no distension.     Palpations: Abdomen is soft.     Tenderness: There is no abdominal tenderness.  Genitourinary:    Comments: NT present as chaperone.  Erythema to the left inguinal fold with satellite lesions. Not hot to the touch. No induration. No fluctuance. No perineum involvement. No crepitus.  Musculoskeletal:     Cervical back: Normal range of motion and neck supple. No edema or rigidity.     Comments: Bruises in variable stages of healing noted to the extremities.  Actively ranging throughout the upper and lower extremities.  No point/focal bony tenderness. No midline spinal tenderness.   Lymphadenopathy:     Cervical: No cervical adenopathy.  Skin:    General: Skin is warm and dry.     Findings: No rash.  Neurological:     Mental Status: She is alert.     Comments: Clear speech.  Sensation and strength grossly intact bilateral upper and lower  extremities.  Patient is ambulatory.  Psychiatric:        Behavior: Behavior normal.    ED Results / Procedures / Treatments   Labs (all labs ordered are listed, but only abnormal results are displayed) Labs Reviewed  URINALYSIS, ROUTINE W REFLEX MICROSCOPIC - Abnormal; Notable for the following components:      Result Value   Hgb urine dipstick TRACE (*)    All other components within normal limits  BASIC METABOLIC PANEL - Abnormal; Notable for the following components:   Potassium 3.3 (*)    All other components within normal limits  CBC - Abnormal; Notable for the following components:   Platelets 69 (*)    All other components within normal limits  URINALYSIS, MICROSCOPIC (REFLEX)  I-STAT BETA HCG BLOOD, ED ([facility], [facility], [facility] ONLY)    EKG None  Radiology No results found.  Procedures Procedures   Medications Ordered in ED Medications  fluorescein ophthalmic strip 1 strip (has no administration in time range)  erythromycin ophthalmic ointment (has no administration in time range)  clotrimazole (LOTRIMIN) 1 % cream (has no administration in time range)  potassium chloride SA (KLOR-CON M) CR tablet 40 mEq (has no administration in time range)   --> ointment and cream to go home with.   ED Course  I have reviewed the triage vital signs and the nursing notes.  Pertinent labs & imaging results that were available during my care of the patient were reviewed by me and considered in my medical decision making (see chart for details).    MDM Rules/Calculators/A&P                          Patient presents to  the ED with complaints of left eye irritation and left groin rash. Nontoxic, vitals without significant abnormality.   Additional history obtained:  Additional history obtained from chart review & nursing note review.  Multiple recent ED visits.  CT head completed on 12/6 demonstrated no acute intracranial abnormality.  Lab Tests:  I reviewed and interpreted labs,  which included:  CBC: Thrombocytopenia similar to prior.  BMP: Mild hypokalemia - oral replacement ordered in the ED.  Preg test: Negative UA: Trace hgb, no obvious UTI.   ED Course:  Left eye irritation: fluorescein stain with findings consistent with small corneal abrasion.  No findings of ulceration or dendritic staining.  No significant conjunctival injection.  There is no purulent drainage.  Exam is not consistent with periorbital/orbital cellulitis.  No reports or signs of trauma.  Visual acuity is grossly intact.  Clinical presentation does not seem consistent with acute angle-closure glaucoma.  Will treat with erythromycin ophthalmic ointment which will be provided in the emergency department, patient is not a contact lens wearer.  Ophthalmology follow-up provided.  Left groin rash: Findings consistent with Candida dermatitis of the left inguinal crease with erythema and satellite lesions.  There is no increased warmth, induration, fluctuance, clinically does not appear consistent with cellulitis or abscess.  There is no crepitus.  No extension to the perineum.  Exam is not appear consistent with Fournier's gangrene.  Will treat with topical clotrimazole which will be provided in the ER.  In terms of patient's complaints of ankle pain as well as her multiple areas of pain which she complained of to the triage team, patient seen in the ED multiple times for similar, she is moving all extremities without difficulty, she has no areas of focal bony tenderness and is ambulatory without difficulty.  Her labs are overall reassuring, thrombocytopenia similar to prior, mild hypokalemia orally replaced, urinalysis without UTI, trace hemoglobin, PCP follow-up.  Paitent is overall well-appearing and seems appropriate for discharge at this time. Attempted to contact patient's legal guardian Vincente Liberty without success, patient is alert & oriented.  I discussed results, treatment plan, need for follow-up, and  return precautions with the patient. Provided opportunity for questions, patient confirmed understanding and is in agreement with plan.   Portions of this note were generated with [employer]. Dictation errors may occur despite best attempts at proofreading.  Final Clinical Impression(s) / ED Diagnoses Final diagnoses:  Abrasion of left cornea, initial encounter  Candidal dermatitis    Rx / DC Orders ED Discharge Orders          Ordered    clotrimazole (LOTRIMIN) 1 % cream        06/18/21 0020             Abhi Moccia, Glynda Jaeger, PA-C 06/18/21 0023  Following plan for discharge patient now stating that she is having neck pain as she had initially on arrival then denied to me on my initial assessment. Chart re-reviewed- She had a head and C spine CT as well as a T spine xray performed 06/09/21. I re-examined the patient, head is atraumatic, normocephalic, no raccoon eyes or battle sign, she remains with no midline spinal tenderness, focal bony tenderness, or focal neurologic deficits at this time. She is ambulatory. She appears in no distress. She states she does not want to leave and is now requesting a bed upstairs, she is becoming increasingly frustrated with me and somewhat verbally aggressive, she does not appear to have indication for admission at this  time, she is alert & oriented, this was explained to her, she has been seen in the ED for similar in the past, remains appropriate for discharge at this time. I advised that she may remain in the lobby to rest if needed.   Discussed w/ attending Dr. Stark Jock- in agreement.     Amaryllis Dyke, PA-C 06/18/21 0120    Veryl Speak, MD 06/18/21 [phone]

## 2021-06-17 NOTE — ED Provider Notes (Signed)
Emergency Medicine Provider Triage Evaluation Note  Kaitlyn Duncan , a 50 y.o. female  was evaluated in triage.  Pt complains of neck pain, back pain, knee pain, and burning with urination. Hx melanoma with brain metastases. Hx of psychosis, malingering.  Review of Systems  Positive: Dysuria, back pain, neck pain Negative: Chest pain, shortness of breath  Physical Exam  BP (!) 135/96 (BP Location: Left Arm)    Pulse 98    Temp 98.4 F (36.9 C) (Oral)    Resp 16    Ht 5\' 1"  (1.549 m)    Wt 55.8 kg    SpO2 99%    BMI 23.24 kg/m  Gen:   Awake, no distress   Resp:  Normal effort  MSK:   Moves extremities without difficulty  Other:    Medical Decision Making  Medically screening exam initiated at 9:39 PM.  Appropriate orders placed.  Kaitlyn Duncan was informed that the remainder of the evaluation will be completed by another provider, this initial triage assessment does not replace that evaluation, and the importance of remaining in the ED until their evaluation is complete.  Dysuria new sx   Kaitlyn Duncan 06/17/21 2142    Kaitlyn Saver, MD 06/18/21 [phone]

## 2021-06-18 DIAGNOSIS — S0502XA Injury of conjunctiva and corneal abrasion without foreign body, left eye, initial encounter: Secondary | ICD-10-CM | POA: Diagnosis not present

## 2021-06-18 MED ORDER — CLOTRIMAZOLE 1 % EX CREA
TOPICAL_CREAM | CUTANEOUS | 0 refills | Status: DC
Start: 2021-06-18 — End: 2021-07-24

## 2021-06-18 NOTE — ED Notes (Signed)
Patient refused d/c VSs.

## 2021-06-18 NOTE — Discharge Instructions (Addendum)
You were seen in the emergency department today for left eye irritation, a groin rash, as well as pain in your joints.  Your labs showed that your potassium was mildly low at 3.3, please include potassium rich foods in your diet, your urine showed that there was a small amount of hemoglobin in it, please have this rechecked by your primary care provider.  Your urine did not show findings of infection.  Your eye exam showed findings of a corneal abrasion-please use the erythromycin ointment provided in the emergency department 4 times per day for the next 5 days.  Your rash in your groin appears concerning for a yeast infection, please apply clotrimazole  to the effected area twice daily.   We have prescribed you new medication(s) today. Discuss the medications prescribed today with your pharmacist as they can have adverse effects and interactions with your other medicines including over the counter and prescribed medications. Seek medical evaluation if you start to experience new or abnormal symptoms after taking one of these medicines, seek care immediately if you start to experience difficulty breathing, feeling of your throat closing, facial swelling, or rash as these could be indications of a more serious allergic reaction  Please follow-up with your primary care provider for a general recheck of your symptoms.  We have also provided ophthalmology follow-up in regards to your eye concern.  Return to the emergency department for new or worsening symptoms or any other concerns.

## 2021-06-21 ENCOUNTER — Emergency Department (HOSPITAL_BASED_OUTPATIENT_CLINIC_OR_DEPARTMENT_OTHER): Payer: Medicare Other

## 2021-06-21 ENCOUNTER — Emergency Department (HOSPITAL_BASED_OUTPATIENT_CLINIC_OR_DEPARTMENT_OTHER)
Admission: EM | Admit: 2021-06-21 | Discharge: 2021-06-21 | Disposition: A | Discharge status: Home / Self Care | Payer: Medicare Other | Attending: Emergency Medicine | Admitting: Emergency Medicine

## 2021-06-21 ENCOUNTER — Encounter (HOSPITAL_BASED_OUTPATIENT_CLINIC_OR_DEPARTMENT_OTHER): Payer: Self-pay

## 2021-06-21 ENCOUNTER — Other Ambulatory Visit: Payer: Self-pay

## 2021-06-21 ENCOUNTER — Emergency Department (HOSPITAL_BASED_OUTPATIENT_CLINIC_OR_DEPARTMENT_OTHER)
Admission: EM | Admit: 2021-06-21 | Discharge: 2021-06-22 | Disposition: A | Discharge status: Home / Self Care | Payer: Medicare Other | Source: Home / Self Care | Attending: Emergency Medicine | Admitting: Emergency Medicine

## 2021-06-21 DIAGNOSIS — R141 Gas pain: Secondary | ICD-10-CM | POA: Insufficient documentation

## 2021-06-21 DIAGNOSIS — M50322 Other cervical disc degeneration at C5-C6 level: Secondary | ICD-10-CM | POA: Diagnosis not present

## 2021-06-21 DIAGNOSIS — E119 Type 2 diabetes mellitus without complications: Secondary | ICD-10-CM | POA: Diagnosis not present

## 2021-06-21 DIAGNOSIS — R519 Headache, unspecified: Secondary | ICD-10-CM | POA: Diagnosis not present

## 2021-06-21 DIAGNOSIS — R0981 Nasal congestion: Secondary | ICD-10-CM | POA: Insufficient documentation

## 2021-06-21 DIAGNOSIS — M542 Cervicalgia: Secondary | ICD-10-CM | POA: Insufficient documentation

## 2021-06-21 DIAGNOSIS — R142 Eructation: Secondary | ICD-10-CM | POA: Insufficient documentation

## 2021-06-21 DIAGNOSIS — Z7951 Long term (current) use of inhaled steroids: Secondary | ICD-10-CM | POA: Insufficient documentation

## 2021-06-21 DIAGNOSIS — K219 Gastro-esophageal reflux disease without esophagitis: Secondary | ICD-10-CM | POA: Insufficient documentation

## 2021-06-21 DIAGNOSIS — J029 Acute pharyngitis, unspecified: Secondary | ICD-10-CM | POA: Insufficient documentation

## 2021-06-21 DIAGNOSIS — M2578 Osteophyte, vertebrae: Secondary | ICD-10-CM | POA: Diagnosis not present

## 2021-06-21 DIAGNOSIS — R21 Rash and other nonspecific skin eruption: Secondary | ICD-10-CM | POA: Insufficient documentation

## 2021-06-21 DIAGNOSIS — R350 Frequency of micturition: Secondary | ICD-10-CM | POA: Diagnosis not present

## 2021-06-21 DIAGNOSIS — M549 Dorsalgia, unspecified: Secondary | ICD-10-CM | POA: Diagnosis not present

## 2021-06-21 DIAGNOSIS — Z765 Malingerer [conscious simulation]: Secondary | ICD-10-CM | POA: Insufficient documentation

## 2021-06-21 DIAGNOSIS — R1031 Right lower quadrant pain: Secondary | ICD-10-CM | POA: Diagnosis not present

## 2021-06-21 DIAGNOSIS — R2243 Localized swelling, mass and lump, lower limb, bilateral: Secondary | ICD-10-CM | POA: Insufficient documentation

## 2021-06-21 DIAGNOSIS — Z85828 Personal history of other malignant neoplasm of skin: Secondary | ICD-10-CM | POA: Insufficient documentation

## 2021-06-21 DIAGNOSIS — M50323 Other cervical disc degeneration at C6-C7 level: Secondary | ICD-10-CM | POA: Diagnosis not present

## 2021-06-21 LAB — BASIC METABOLIC PANEL
Anion gap: 7 (ref 5–15)
BUN: 23 mg/dL — ABNORMAL HIGH (ref 6–20)
CO2: 28 mmol/L (ref 22–32)
Calcium: 9.1 mg/dL (ref 8.9–10.3)
Chloride: 103 mmol/L (ref 98–111)
Creatinine, Ser: 0.49 mg/dL (ref 0.44–1.00)
GFR, Estimated: >60 mL/min (ref >60)
Glucose, Bld: 82 mg/dL (ref 70–99)
Potassium: 3.7 mmol/L (ref 3.5–5.1)
Sodium: 138 mmol/L (ref 135–145)

## 2021-06-21 LAB — CBC WITH DIFFERENTIAL/PLATELET
Abs Immature Granulocytes: 0.02 10*3/uL (ref 0.00–0.07)
Basophils Absolute: 0 10*3/uL (ref 0.0–0.1)
Basophils Relative: 0 %
Eosinophils Absolute: 0 10*3/uL (ref 0.0–0.5)
Eosinophils Relative: 0 %
HCT: 36.7 % (ref 36.0–46.0)
Hemoglobin: 11.7 g/dL — ABNORMAL LOW (ref 12.0–15.0)
Immature Granulocytes: 0 %
Lymphocytes Relative: 23 %
Lymphs Abs: 1.1 10*3/uL (ref 0.7–4.0)
MCH: 27.6 pg (ref 26.0–34.0)
MCHC: 31.9 g/dL (ref 30.0–36.0)
MCV: 86.6 fL (ref 80.0–100.0)
Monocytes Absolute: 0.6 10*3/uL (ref 0.1–1.0)
Monocytes Relative: 12 %
Neutro Abs: 3 10*3/uL (ref 1.7–7.7)
Neutrophils Relative %: 65 %
Platelets: 53 10*3/uL — ABNORMAL LOW (ref 150–400)
RBC: 4.24 MIL/uL (ref 3.87–5.11)
RDW: 13.2 % (ref 11.5–15.5)
Smear Review: DECREASED
WBC: 4.7 10*3/uL (ref 4.0–10.5)
nRBC: 0 % (ref 0.0–0.2)

## 2021-06-21 LAB — URINALYSIS, ROUTINE W REFLEX MICROSCOPIC
Bilirubin Urine: NEGATIVE
Glucose, UA: NEGATIVE mg/dL
Hgb urine dipstick: NEGATIVE
Ketones, ur: NEGATIVE mg/dL
Leukocytes,Ua: NEGATIVE
Nitrite: NEGATIVE
Protein, ur: NEGATIVE mg/dL
Specific Gravity, Urine: 1.014 (ref 1.005–1.030)
pH: 5.5 (ref 5.0–8.0)

## 2021-06-21 LAB — LIPASE, BLOOD: Lipase: 42 U/L (ref 11–51)

## 2021-06-21 MED ORDER — KETOROLAC TROMETHAMINE 30 MG/ML IJ SOLN: 30.0000 mg | Freq: Once | INTRAMUSCULAR | Status: DC

## 2021-06-21 MED ORDER — HYDROCORTISONE 1 % EX CREA: TOPICAL_CREAM | CUTANEOUS | 0 refills | Status: DC

## 2021-06-21 MED ORDER — FLUTICASONE PROPIONATE 50 MCG/ACT NA SUSP: 2.0000 | Freq: Every day | NASAL | 2 refills | Status: DC

## 2021-06-21 MED ORDER — KETOROLAC TROMETHAMINE 15 MG/ML IJ SOLN
15.0000 mg | Freq: Once | INTRAMUSCULAR | Status: AC
  Administered 2021-06-21: 11:00:00 15 mg via INTRAVENOUS
  Filled 2021-06-21: qty 1

## 2021-06-21 NOTE — ED Provider Notes (Signed)
[facility] EMERGENCY DEPT Provider Note   CSN: [id number] Arrival date & time: 06/21/21  0848     History Chief Complaint  Patient presents with   Back Pain   Urinary Frequency    Kaitlyn Duncan is a 50 y.o. female.   Back Pain Associated symptoms: headaches   Urinary Frequency Associated symptoms include headaches.   50 year old female with a medical history significant for DM 2, GERD, melanoma with mets in remission, paranoia, malingering, psychosis, adjustment disorder who presents to the emergency department with multiple complaints.  She states that she has had neck pain and headaches since she was seen in the emergency department earlier this month.  She additionally feels that over the past few days she has had some issues with urinary incontinence.  She denies any specific dysuria.  She endorses increased urinary frequency.  She denies any weakness or numbness, saddle anesthesia, fevers or chills.  Denies any radicular pain or symptoms.  She endorses mild low back pain as well.  She was recently evaluated by psychiatry while in the emergency department earlier this month.  She currently denies any SI, HI, AVH.  She denies having a legal guardian at this time and states that she makes her own healthcare decisions.  Past Medical History:  Diagnosis Date   Allergy    Anemia    Anemia    Diabetes mellitus without complication ([place])    prediabetic   GERD (gastroesophageal reflux disease)    melanoma with met dz dx'd 01/2018   brain and adrenal   Seasonal allergies     Patient Active Problem List   Diagnosis Date Noted   Has difficulty accessing primary care provider for most visits 06/15/2021   Paranoia ([place])    Malingering 01/23/2021   Candidiasis of skin 01/23/2021   Musculoskeletal pain 01/23/2021   Psychosis ([place])    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   Melanoma of skin ([place]) 05/29/2018   Goals of  care, counseling/discussion 05/29/2018   Brain metastasis ([place]) 01/26/2018    Past Surgical History:  Procedure Laterality Date   BIOPSY  01/28/2018   Procedure: BIOPSY;  Surgeon: Laurence Spates, MD;  Location: [facility];  Service: Endoscopy;;   BREAST BIOPSY Left    ESOPHAGOGASTRODUODENOSCOPY N/A 01/28/2018   Procedure: ESOPHAGOGASTRODUODENOSCOPY (EGD);  Surgeon: Laurence Spates, MD;  Location: [facility];  Service: Endoscopy;  Laterality: N/A;   MOUTH SURGERY     OVARY SURGERY     removed "something"     OB History     Gravida  0   Para  0   Term  0   Preterm  0   AB  0   Living  0      SAB  0   IAB  0   Ectopic  0   Multiple  0   Live Births  0           Family History  Problem Relation Age of Onset   Melanoma Mother    Hypertension Father    Breast cancer Maternal Aunt    Breast cancer Paternal Aunt    Diabetes Paternal Aunt    Breast cancer Maternal Grandmother    Breast cancer Paternal Grandmother    Diabetes Paternal Grandmother    Colon cancer Neg Hx    Esophageal cancer Neg Hx    Pancreatic cancer Neg Hx    Stomach cancer Neg Hx    Liver disease Neg Hx  Rectal cancer Neg Hx     Social History   Tobacco Use   Smoking status: Never   Smokeless tobacco: Never  Vaping Use   Vaping Use: Never used  Substance Use Topics   Alcohol use: No   Drug use: No    Home Medications Prior to Admission medications   Medication Sig Start Date End Date Taking? Authorizing Provider  hydrocortisone cream 1 % Apply to affected area 2 times daily 06/21/21  Yes Regan Lemming, MD  clotrimazole (LOTRIMIN) 1 % cream Apply to affected area 2 times daily 06/18/21   Petrucelli, Glynda Jaeger, PA-C    Allergies    Patient has no known allergies.  Review of Systems   Review of Systems  Genitourinary:  Positive for frequency.  Musculoskeletal:  Positive for back pain and neck pain.  Neurological:  Positive for headaches.  All other systems reviewed  and are negative.  Physical Exam Updated Vital Signs BP 131/77    Pulse 73    Temp 97.7 F (36.5 C) (Oral)    Resp 16    Ht $R'5\' 1"'tR$  (1.549 m)    Wt 55.8 kg    SpO2 100%    BMI 23.24 kg/m   Physical Exam Vitals and nursing note reviewed.  Constitutional:      General: She is not in acute distress.    Appearance: She is well-developed.     Comments: GCS 15, ABC intact  HENT:     Head: Normocephalic and atraumatic.  Eyes:     Extraocular Movements: Extraocular movements intact.     Conjunctiva/sclera: Conjunctivae normal.     Pupils: Pupils are equal, round, and reactive to light.  Neck:     Comments: Negative Spurling sign, mild soft tissue tenderness to palpation of the neck. Cardiovascular:     Rate and Rhythm: Normal rate and regular rhythm.     Heart sounds: No murmur heard. Pulmonary:     Effort: Pulmonary effort is normal. No respiratory distress.     Breath sounds: Normal breath sounds.  Chest:     Comments: Clavicles stable nontender to AP compression.  Chest wall stable and nontender to AP and lateral compression. Abdominal:     General: There is no distension.     Palpations: Abdomen is soft.     Tenderness: There is no abdominal tenderness. There is no guarding.  Musculoskeletal:        General: No swelling, deformity or signs of injury.     Cervical back: Neck supple.     Comments: No midline tenderness of the thoracic or lumbar spine.  Skin:    General: Skin is warm and dry.     Capillary Refill: Capillary refill takes less than 2 seconds.     Findings: Rash present. No lesion.     Comments: Mild eczematous rash along the patient's cheeks bilaterally  Neurological:     General: No focal deficit present.     Mental Status: She is alert. Mental status is at baseline.     Comments: MENTAL STATUS EXAM:    Orientation: Alert and oriented to person, place and time.  Memory: Cooperative, follows commands well.  Language: Speech is clear and language is normal.    CRANIAL NERVES:    CN 2 (Optic): Visual fields intact to confrontation.  CN 3,4,6 (EOM): Pupils equal and reactive to light. Full extraocular eye movement without nystagmus.  CN 5 (Trigeminal): Facial sensation is normal, no weakness of masticatory muscles.  CN  7 (Facial): No facial weakness or asymmetry.  CN 8 (Auditory): Auditory acuity grossly normal.  CN 9,10 (Glossophar): The uvula is midline, the palate elevates symmetrically.  CN 11 (spinal access): Normal sternocleidomastoid and trapezius strength.  CN 12 (Hypoglossal): The tongue is midline. No atrophy or fasciculations.Marland Kitchen   MOTOR:  Muscle Strength: 5/5RUE, 5/5LUE, 5/5RLE, 5/5LLE.   COORDINATION:   Intact finger-to-nose, no tremor, no pronator drift.   SENSATION:   Intact to light touch all four extremities.  GAIT: Gait normal without ataxia   Psychiatric:        Mood and Affect: Mood normal.        Behavior: Behavior normal.        Thought Content: Thought content normal.        Judgment: Judgment normal.    ED Results / Procedures / Treatments   Labs (all labs ordered are listed, but only abnormal results are displayed) Labs Reviewed  URINALYSIS, ROUTINE W REFLEX MICROSCOPIC - Abnormal; Notable for the following components:      Result Value   Color, Urine COLORLESS (*)    All other components within normal limits  CBC WITH DIFFERENTIAL/PLATELET - Abnormal; Notable for the following components:   Hemoglobin 11.7 (*)    Platelets 53 (*)    All other components within normal limits  BASIC METABOLIC PANEL - Abnormal; Notable for the following components:   BUN 23 (*)    All other components within normal limits  LIPASE, BLOOD    EKG None  Radiology CT Head Wo Contrast  Result Date: 06/21/2021 CLINICAL DATA:  Neck pain.  Intermittent headache. EXAM: CT HEAD WITHOUT CONTRAST CT CERVICAL SPINE WITHOUT CONTRAST TECHNIQUE: Multidetector CT imaging of the head and cervical spine was performed following the  standard protocol without intravenous contrast. Multiplanar CT image reconstructions of the cervical spine were also generated. COMPARISON:  CT cervical spine 06/09/2021; CT head 06/09/2021 FINDINGS: CT HEAD FINDINGS Brain: No evidence of acute infarction, hemorrhage, hydrocephalus, extra-axial collection or mass lesion/mass effect. Hypodensities in the periventricular white matter consistent with chronic small-vessel ischemic changes, more than expected for patient age, unchanged compared to 06/09/2021. Vascular: No hyperdense vessel or unexpected calcification. Skull: Normal. Negative for fracture or focal lesion. Sinuses/Orbits: No acute finding. Mucosal thickening in the partially visualized left maxillary sinus. Other: Cerumen in the bilateral external auditory canals. CT CERVICAL SPINE FINDINGS Alignment: Straightening of the normal cervical lordosis. No traumatic malalignment. Skull base and vertebrae: No acute fracture. No primary bone lesion or focal pathologic process. Soft tissues and spinal canal: No prevertebral fluid or swelling. No visible canal hematoma. Disc levels: Advanced osteoarthritis at the atlantodental joint. No significant degenerative changes at the C2-C5 levels. Moderate degenerative disc disease at C5-C6 and C6-C7 with loss of disc space height and anterior and posterior osteophytes. Multilevel moderate bilateral hypertrophic facet arthropathy, most pronounced at the C3-C5 levels. Uncovertebral spurring common combination with facet arthropathy, results in moderate left neural foraminal narrowing at C5-C6. Upper chest: Negative. Other: None. IMPRESSION: CT head: 1. No acute intracranial abnormality. 2. Premature white matter disease, unchanged compared to CT head 06/09/2021. CT cervical spine: 1. No fracture or traumatic malalignment. 2. Moderate left neural foraminal narrowing at C5-C6. Electronically Signed   By: Ileana Roup M.D.   On: 06/21/2021 11:55   CT Cervical Spine Wo  Contrast  Result Date: 06/21/2021 CLINICAL DATA:  Neck pain.  Intermittent headache. EXAM: CT HEAD WITHOUT CONTRAST CT CERVICAL SPINE WITHOUT CONTRAST TECHNIQUE: Multidetector CT imaging of  the head and cervical spine was performed following the standard protocol without intravenous contrast. Multiplanar CT image reconstructions of the cervical spine were also generated. COMPARISON:  CT cervical spine 06/09/2021; CT head 06/09/2021 FINDINGS: CT HEAD FINDINGS Brain: No evidence of acute infarction, hemorrhage, hydrocephalus, extra-axial collection or mass lesion/mass effect. Hypodensities in the periventricular white matter consistent with chronic small-vessel ischemic changes, more than expected for patient age, unchanged compared to 06/09/2021. Vascular: No hyperdense vessel or unexpected calcification. Skull: Normal. Negative for fracture or focal lesion. Sinuses/Orbits: No acute finding. Mucosal thickening in the partially visualized left maxillary sinus. Other: Cerumen in the bilateral external auditory canals. CT CERVICAL SPINE FINDINGS Alignment: Straightening of the normal cervical lordosis. No traumatic malalignment. Skull base and vertebrae: No acute fracture. No primary bone lesion or focal pathologic process. Soft tissues and spinal canal: No prevertebral fluid or swelling. No visible canal hematoma. Disc levels: Advanced osteoarthritis at the atlantodental joint. No significant degenerative changes at the C2-C5 levels. Moderate degenerative disc disease at C5-C6 and C6-C7 with loss of disc space height and anterior and posterior osteophytes. Multilevel moderate bilateral hypertrophic facet arthropathy, most pronounced at the C3-C5 levels. Uncovertebral spurring common combination with facet arthropathy, results in moderate left neural foraminal narrowing at C5-C6. Upper chest: Negative. Other: None. IMPRESSION: CT head: 1. No acute intracranial abnormality. 2. Premature white matter disease, unchanged  compared to CT head 06/09/2021. CT cervical spine: 1. No fracture or traumatic malalignment. 2. Moderate left neural foraminal narrowing at C5-C6. Electronically Signed   By: Ileana Roup M.D.   On: 06/21/2021 11:55    Procedures Procedures   Medications Ordered in ED Medications  ketorolac (TORADOL) 15 MG/ML injection 15 mg (15 mg Intravenous Given 06/21/21 1105)    ED Course  I have reviewed the triage vital signs and the nursing notes.  Pertinent labs & imaging results that were available during my care of the patient were reviewed by me and considered in my medical decision making (see chart for details).    MDM Rules/Calculators/A&P                           50 year old female with a medical history significant for DM 2, GERD, melanoma with mets in remission, paranoia, malingering, psychosis, adjustment disorder who presents to the emergency department with multiple complaints.  She states that she has had neck pain and headaches since she was seen in the emergency department earlier this month.  She additionally feels that over the past few days she has had some issues with urinary incontinence.  She denies any specific dysuria.  She endorses increased urinary frequency.  She denies any weakness or numbness, saddle anesthesia, fevers or chills.  Denies any radicular pain or symptoms.  She endorses mild low back pain as well.  She was recently evaluated by psychiatry while in the emergency department earlier this month.  She currently denies any SI, HI, AVH.  She denies having a legal guardian at this time and states that she makes her own healthcare decisions.  On arrival, the patient was afebrile, well-appearing, vitally stable.  She had mild tenderness palpation of the soft tissues of her neck and mild midline tenderness.  No acute neurologic deficits with a normal neurologic exam.  Negative Spurling sign.  Endorsing acute on chronic neck pain with no red flag back pain symptoms at this  time.  She has no lower extremity numbness or weakness, no fecal incontinence.  Some issue with urinary leaking of unclear etiology.  No saddle anesthesia.  No fevers or chills.  Laboratory work-up was significant for urinalysis which was negative for urinary tract infection, CBC without a leukocytosis, WBC 4.7, stable anemia to 11.7, lipase normal, BMP generally unremarkable.  Patient underwent CT head and cervical spine which was negative for acute intracranial abnormality.  Cervical spine CT revealed the following: Disc levels: Advanced osteoarthritis at the atlantodental joint. No  significant degenerative changes at the C2-C5 levels. Moderate  degenerative disc disease at C5-C6 and C6-C7 with loss of disc space  height and anterior and posterior osteophytes. Multilevel moderate  bilateral hypertrophic facet arthropathy, most pronounced at the  C3-C5 levels. Uncovertebral spurring common combination with facet  arthropathy, results in moderate left neural foraminal narrowing at  C5-C6.    This is likely the etiology of her neck pain.  Advised the patient of these findings with degenerative disc disease noted at C5-C6 and C6-C7.  With negative Spurling sign and no acute neurologic deficits, there is no acute indication for intervention at this time.  The patient's findings of urinary incontinence do not correlate with her back and neck pain.  She has no tenderness palpation about her lower spine with no recent trauma, and a normal neurologic exam in the lower extremities.  I do not feel that the patient has a need for further imaging of her spine at this time with low concern for cauda equina syndrome.  Referral was placed for follow-up with dermatology at patient's request due to rash along her cheeks for which she was provided hydrocortisone cream.  Referral to obstetrics/gynecology was placed for follow-up to discuss the patient's apparent urge incontinence and urinary leaking.  Provided return  precautions in the event of development of concerning findings for cauda equina syndrome which the patient endorsed understanding.  Final Clinical Impression(s) / ED Diagnoses Final diagnoses:  Neck pain  Urinary frequency    Rx / DC Orders ED Discharge Orders          Ordered    Ambulatory referral to Dermatology        06/21/21 1211    hydrocortisone cream 1 %        06/21/21 1212    Ambulatory referral to Obstetrics / Gynecology        06/21/21 1212             Regan Lemming, MD 06/21/21 1523

## 2021-06-21 NOTE — ED Triage Notes (Addendum)
Pt c/o "burping" and "gas issues." Pt was seen here previously today. Pt also c/o bilateral leg swelling.

## 2021-06-21 NOTE — ED Provider Notes (Addendum)
[facility] EMERGENCY DEPT Provider Note   CSN: [id number] Arrival date & time: 06/21/21  2240     History Chief Complaint  Patient presents with   Gastroesophageal Reflux    Kaitlyn Duncan is a 50 y.o. female.  HPI     This is a 50 year old female with a history of seasonal allergies, prediabetes, reflux who presents with multiple complaints.  Initially she complained of burping and gas to the triage nurse.  On my evaluation, she has multiple complaints including nasal congestion, abdominal and chest pain, lower extremity swelling.  When asked what specifically brought her in tonight, patient reports that "I was here earlier today and he did not address any of this stuff."  Patient reports mostly she is having nasal congestion and sore throat.  No fevers.  No known sick contacts.  She states "I also need a heart monitor."  She is not having any chest pain.  She does report frequent belching.  She reports some right lower quadrant abdominal pain.  No nausea, vomiting, change in bowels.  She reports lower extremity edema that comes and goes.  Chart reviewed.  This is the patient's 10th visit in the month of December.  She was noted to have all of her belongings in the room.  I asked her if she had a place to go or stay.  Patient states that she is currently homeless.  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    Melanoma of skin ([place]) 05/29/2018   Goals of care, counseling/discussion 05/29/2018   Brain metastasis ([place]) 01/26/2018    Past Surgical History:  Procedure Laterality Date   BIOPSY  01/28/2018   Procedure: BIOPSY;  Surgeon: Laurence Spates, MD;  Location: [facility];  Service: Endoscopy;;   BREAST BIOPSY Left    ESOPHAGOGASTRODUODENOSCOPY N/A 01/28/2018   Procedure: ESOPHAGOGASTRODUODENOSCOPY (EGD);  Surgeon: Laurence Spates, MD;  Location: [facility];  Service: Endoscopy;  Laterality: N/A;   MOUTH SURGERY     OVARY SURGERY     removed "something"     OB History     Gravida  0   Para  0   Term  0   Preterm  0   AB  0   Living  0      SAB  0   IAB  0   Ectopic  0   Multiple  0   Live Births  0           Family History  Problem Relation Age of Onset   Melanoma Mother    Hypertension Father    Breast cancer Maternal Aunt    Breast cancer Paternal Aunt    Diabetes Paternal Aunt    Breast cancer Maternal Grandmother    Breast cancer Paternal Grandmother    Diabetes Paternal Grandmother    Colon cancer Neg Hx  Esophageal cancer Neg Hx    Pancreatic cancer Neg Hx    Stomach cancer Neg Hx    Liver disease Neg Hx    Rectal cancer Neg Hx     Social History   Tobacco Use   Smoking status: Never   Smokeless tobacco: Never  Vaping Use   Vaping Use: Never used  Substance Use Topics   Alcohol use: No   Drug use: No    Home Medications Prior to Admission medications   Medication Sig Start Date End Date Taking? Authorizing Provider  fluticasone (FLONASE) 50 MCG/ACT nasal spray Place 2 sprays into both nostrils daily. 06/21/21  Yes Ilai Hiller, Barbette Hair, MD  clotrimazole (LOTRIMIN) 1 % cream Apply to affected area 2 times daily 06/18/21   Petrucelli, Aldona Bar R, PA-C  hydrocortisone cream 1 % Apply to affected area 2 times daily 06/21/21   Regan Lemming, MD    Allergies    Patient has no known allergies.  Review of Systems   Review of Systems   Constitutional:  Negative for fever.  HENT:  Positive for congestion and sore throat.   Respiratory:  Negative for cough and shortness of breath.   Cardiovascular:  Positive for leg swelling. Negative for chest pain.  Gastrointestinal:  Positive for abdominal pain. Negative for constipation, diarrhea, nausea and vomiting.  All other systems reviewed and are negative.  Physical Exam Updated Vital Signs BP (!) 151/100 (BP Location: Right Arm)    Pulse 96    Temp 97.9 F (36.6 C)    Resp 17    Ht 1.549 m ($Remove'5\' 1"'kYBSxNl$ )    Wt 55.8 kg    SpO2 100%    BMI 23.24 kg/m   Physical Exam Vitals and nursing note reviewed.  Constitutional:      Appearance: She is well-developed. She is not ill-appearing.  HENT:     Head: Normocephalic and atraumatic.     Nose: Congestion present.     Mouth/Throat:     Mouth: Mucous membranes are moist.     Comments: Uvula midline, no exudate noted Eyes:     Pupils: Pupils are equal, round, and reactive to light.  Cardiovascular:     Rate and Rhythm: Normal rate and regular rhythm.     Heart sounds: Normal heart sounds.  Pulmonary:     Effort: Pulmonary effort is normal. No respiratory distress.     Breath sounds: No wheezing.  Abdominal:     Palpations: Abdomen is soft.     Tenderness: There is no abdominal tenderness.  Musculoskeletal:     Cervical back: Neck supple.     Right lower leg: No edema.     Left lower leg: No edema.  Skin:    General: Skin is warm and dry.  Neurological:     Mental Status: She is alert and oriented to person, place, and time.  Psychiatric:        Mood and Affect: Mood normal.    ED Results / Procedures / Treatments   Labs (all labs ordered are listed, but only abnormal results are displayed) Labs Reviewed - No data to display  EKG EKG Interpretation  Date/Time:  Sunday June 21 2021 23:35:55 EST Ventricular Rate:  76 PR Interval:  148 QRS Duration: 88 QT Interval:  380 QTC Calculation: 427 R Axis:   107 Text  Interpretation: Normal sinus rhythm Rightward axis Borderline ECG Confirmed by Thayer Jew [phone]) on 06/21/2021 11:53:00 PM  Radiology CT Head Wo Contrast  Result  Date: 06/21/2021 CLINICAL DATA:  Neck pain.  Intermittent headache. EXAM: CT HEAD WITHOUT CONTRAST CT CERVICAL SPINE WITHOUT CONTRAST TECHNIQUE: Multidetector CT imaging of the head and cervical spine was performed following the standard protocol without intravenous contrast. Multiplanar CT image reconstructions of the cervical spine were also generated. COMPARISON:  CT cervical spine 06/09/2021; CT head 06/09/2021 FINDINGS: CT HEAD FINDINGS Brain: No evidence of acute infarction, hemorrhage, hydrocephalus, extra-axial collection or mass lesion/mass effect. Hypodensities in the periventricular white matter consistent with chronic small-vessel ischemic changes, more than expected for patient age, unchanged compared to 06/09/2021. Vascular: No hyperdense vessel or unexpected calcification. Skull: Normal. Negative for fracture or focal lesion. Sinuses/Orbits: No acute finding. Mucosal thickening in the partially visualized left maxillary sinus. Other: Cerumen in the bilateral external auditory canals. CT CERVICAL SPINE FINDINGS Alignment: Straightening of the normal cervical lordosis. No traumatic malalignment. Skull base and vertebrae: No acute fracture. No primary bone lesion or focal pathologic process. Soft tissues and spinal canal: No prevertebral fluid or swelling. No visible canal hematoma. Disc levels: Advanced osteoarthritis at the atlantodental joint. No significant degenerative changes at the C2-C5 levels. Moderate degenerative disc disease at C5-C6 and C6-C7 with loss of disc space height and anterior and posterior osteophytes. Multilevel moderate bilateral hypertrophic facet arthropathy, most pronounced at the C3-C5 levels. Uncovertebral spurring common combination with facet arthropathy, results in moderate left neural foraminal  narrowing at C5-C6. Upper chest: Negative. Other: None. IMPRESSION: CT head: 1. No acute intracranial abnormality. 2. Premature white matter disease, unchanged compared to CT head 06/09/2021. CT cervical spine: 1. No fracture or traumatic malalignment. 2. Moderate left neural foraminal narrowing at C5-C6. Electronically Signed   By: Sherron Ales M.D.   On: 06/21/2021 11:55   CT Cervical Spine Wo Contrast  Result Date: 06/21/2021 CLINICAL DATA:  Neck pain.  Intermittent headache. EXAM: CT HEAD WITHOUT CONTRAST CT CERVICAL SPINE WITHOUT CONTRAST TECHNIQUE: Multidetector CT imaging of the head and cervical spine was performed following the standard protocol without intravenous contrast. Multiplanar CT image reconstructions of the cervical spine were also generated. COMPARISON:  CT cervical spine 06/09/2021; CT head 06/09/2021 FINDINGS: CT HEAD FINDINGS Brain: No evidence of acute infarction, hemorrhage, hydrocephalus, extra-axial collection or mass lesion/mass effect. Hypodensities in the periventricular white matter consistent with chronic small-vessel ischemic changes, more than expected for patient age, unchanged compared to 06/09/2021. Vascular: No hyperdense vessel or unexpected calcification. Skull: Normal. Negative for fracture or focal lesion. Sinuses/Orbits: No acute finding. Mucosal thickening in the partially visualized left maxillary sinus. Other: Cerumen in the bilateral external auditory canals. CT CERVICAL SPINE FINDINGS Alignment: Straightening of the normal cervical lordosis. No traumatic malalignment. Skull base and vertebrae: No acute fracture. No primary bone lesion or focal pathologic process. Soft tissues and spinal canal: No prevertebral fluid or swelling. No visible canal hematoma. Disc levels: Advanced osteoarthritis at the atlantodental joint. No significant degenerative changes at the C2-C5 levels. Moderate degenerative disc disease at C5-C6 and C6-C7 with loss of disc space height and  anterior and posterior osteophytes. Multilevel moderate bilateral hypertrophic facet arthropathy, most pronounced at the C3-C5 levels. Uncovertebral spurring common combination with facet arthropathy, results in moderate left neural foraminal narrowing at C5-C6. Upper chest: Negative. Other: None. IMPRESSION: CT head: 1. No acute intracranial abnormality. 2. Premature white matter disease, unchanged compared to CT head 06/09/2021. CT cervical spine: 1. No fracture or traumatic malalignment. 2. Moderate left neural foraminal narrowing at C5-C6. Electronically Signed   By: Janann Colonel.D.  On: 06/21/2021 11:55    Procedures Procedures   Medications Ordered in ED Medications - No data to display  ED Course  I have reviewed the triage vital signs and the nursing notes.  Pertinent labs & imaging results that were available during my care of the patient were reviewed by me and considered in my medical decision making (see chart for details).    MDM Rules/Calculators/A&P                          Patient presents with multiple complaints.  She is nontoxic and vital signs are notable for blood pressure 151/100.  Otherwise she is nontoxic and afebrile.  She she states that her most concerning complaint is for congestion and sore throat.  No signs or symptoms of bacterial infection.  Could be allergies or viral infection.  Patient has had multiple recent work-up which has been largely reassuring.  Do not feel she needs further work-up at this time.  I did get a screening EKG which is normal.  I discussed with the patient that it is inappropriate to come to the emergency department for ongoing subacute complaints.  She needs to establish primary care at [facility].  I suspect that she is malingering as it is very cold outside and she does not have a place to stay.  She was provided with a blanket.  After history, exam, and medical workup I feel the patient has been appropriately medically  screened and is safe for discharge home. Pertinent diagnoses were discussed with the patient. Patient was given return precautions.    1:45 AM Patient is refusing to leave.  She is requesting a bed in the bathroom.  When I discussed with her that the ER is not a shelter, she began to yell at me and that "it is her duty to take care of me."  I discussed with her that at this time it did not appear that she had acute medical emergency.  We do not have the capacity to provide shelter.  I have offered her a taxi if she had a place to go.  Nursing myself attempted multiple times to contact her legal guardian at [employer].  This was unsuccessful.  5:25 AM Patient was allowed to sleep in triage room as she did not have a definitive place to go.  I discharge this morning, patient continues to refuse to go.  She is now calling people names.  She has been awake, alert, oriented.  She is now "calling the nurse which and asking [name] to send her to hell."  Do not feel she is acutely psychotic but rather malingering as it is cold outside and she does not want to leave.  Additional assistance to remove the patient from property.  Given ample opportunity to make arrangements and was offered a cab voucher.  Final Clinical Impression(s) / ED Diagnoses Final diagnoses:  Nasal congestion  Malingering    Rx / DC Orders ED Discharge Orders          Ordered    fluticasone (FLONASE) 50 MCG/ACT nasal spray  Daily        06/21/21 2348             Merryl Hacker, MD 06/21/21 7209    Merryl Hacker, MD 06/22/21 4709    Merryl Hacker, MD 06/22/21 [phone]

## 2021-06-21 NOTE — Discharge Instructions (Addendum)
You were evaluated in the Emergency Department and after careful evaluation, we did not find any emergent condition requiring admission or further testing in the hospital.  Your exam/testing today was overall reassuring. Your head CT was reassuring. Your neck CT revealed the following: Disc levels: Advanced osteoarthritis at the atlantodental joint. No  significant degenerative changes at the C2-C5 levels. Moderate  degenerative disc disease at C5-C6 and C6-C7 with loss of disc space  height and anterior and posterior osteophytes. Multilevel moderate  bilateral hypertrophic facet arthropathy, most pronounced at the  C3-C5 levels. Uncovertebral spurring common combination with facet  arthropathy, results in moderate left neural foraminal narrowing at  C5-C6.    This likely reflects degenerative disc disease in your cervical spine which is likely causing your neck pain symptoms. Follow-up with an OBGYN to discuss your episodes of urinary incontinence. I do not believe your incontinence is related to acute spinal cord compression at this time. Return to the ED with worsening back pain, persistent urinary/fecal incontinence, new numbness/weakness of your legs, new numbness around your groin. These would be red flag symptoms concerning for acute spinal cord compression requiring an emergent MRI.  Please return to the Emergency Department if you experience any worsening of your condition.  Thank you for allowing [place] to be a part of your care.

## 2021-06-21 NOTE — Discharge Instructions (Addendum)
You were seen today for multiple complaints.  It is very important that you follow-up with a primary physician.  You were given information regarding community health and wellness.  Take Flonase for your nasal congestion.

## 2021-06-21 NOTE — ED Triage Notes (Signed)
Pt arrives [facility] c/o neck pain, lower back pain, abdominal pain, and urinary "leaking", urinary frequency.  Reports intermittent headache.  Denies N/V/D.

## 2021-06-22 ENCOUNTER — Ambulatory Visit
Admission: EM | Admit: 2021-06-22 | Discharge: 2021-06-22 | Disposition: A | Discharge status: Home / Self Care | Payer: Medicare Other | Attending: Emergency Medicine | Admitting: Emergency Medicine

## 2021-06-22 ENCOUNTER — Other Ambulatory Visit: Payer: Self-pay

## 2021-06-22 DIAGNOSIS — Z5900 Homelessness unspecified: Secondary | ICD-10-CM | POA: Diagnosis not present

## 2021-06-22 DIAGNOSIS — R600 Localized edema: Secondary | ICD-10-CM

## 2021-06-22 DIAGNOSIS — E876 Hypokalemia: Secondary | ICD-10-CM | POA: Diagnosis not present

## 2021-06-22 DIAGNOSIS — D696 Thrombocytopenia, unspecified: Secondary | ICD-10-CM

## 2021-06-22 DIAGNOSIS — D649 Anemia, unspecified: Secondary | ICD-10-CM

## 2021-06-22 DIAGNOSIS — N393 Stress incontinence (female) (male): Secondary | ICD-10-CM | POA: Diagnosis not present

## 2021-06-22 LAB — POCT URINALYSIS DIP (MANUAL ENTRY)
Bilirubin, UA: NEGATIVE
Blood, UA: NEGATIVE
Glucose, UA: NEGATIVE mg/dL
Ketones, POC UA: NEGATIVE mg/dL
Leukocytes, UA: NEGATIVE
Nitrite, UA: NEGATIVE
Protein Ur, POC: NEGATIVE mg/dL
Spec Grav, UA: 1.02 (ref 1.010–1.025)
Urobilinogen, UA: 0.2 E.U./dL
pH, UA: 5.5 (ref 5.0–8.0)

## 2021-06-22 NOTE — ED Notes (Signed)
Patient given the option for a cab voucher. Patient is refusing.

## 2021-06-22 NOTE — ED Notes (Addendum)
Security at bedside requesting patient to leave.

## 2021-06-22 NOTE — ED Notes (Signed)
Patient awake and asks to go to the bathroom. This nurse assist patient to bathroom. Upon returning from bathroom, this nurse asks  patient to make any phone calls she needs to make so that she can receive a cab voucher to leave. Patient states she will make phone calls.

## 2021-06-22 NOTE — ED Notes (Signed)
Patient states at this time she is unable to contact anyone this late at night. This nurse tells patient  it is important to try to get up with someone because she will have to leave shortly.

## 2021-06-22 NOTE — ED Notes (Signed)
Patient has been given multiple hours to make arrangements to transport to another location, Camera operator offered patient a cab voucher to another location. Patient is refusing to leave. Patient states to this nurse, "Lord remove this witch and send her to hell, Father." Camera operator and provider made aware. Security notified to ask patient to leave.

## 2021-06-22 NOTE — ED Triage Notes (Signed)
Pt presents with ongoing GI issues ( would not specify specifics), pt also complains of chronic bilateral leg and ankle swelling with no pain, pt also complains of frequent urination for awhile as well as dry skin spots.

## 2021-06-22 NOTE — ED Notes (Signed)
[employer] at bedside to ask patient to leave.

## 2021-06-22 NOTE — Discharge Instructions (Addendum)
This that we performed in our office today was normal however because you are having symptoms, I feel is important that we also perform a urine culture to make sure there are no bacteria present in your urine.  If there are any present based on urine culture findings, we will call a antibiotic to your pharmacy for you to begin taking soon as possible.  Because the urinalysis today was normal, I do not have a good idea which antibiotic we should start so it is best that we wait for the urine culture result.  Please be sure you reach out to [facility] today to establish care with them and schedule your first appointment for follow-up of anemia, low potassium and lower extremity swelling.  I am very concerned about you being out of the cold during the sculptor season, I know that they will have resources for you to find a place to stay as well as being a very good primary care facility.  I have provided for them information regarding your current health so the first time they see you they will already know lipid about 2.

## 2021-06-22 NOTE — ED Notes (Signed)
This nurse attempted to contact Kaitlyn Duncan (legal guardian listed in chart) at both numbers listed. There was no answer at either number.  Patient denies having a legal guardian at this time. Dr. Dina Rich notified.

## 2021-06-23 ENCOUNTER — Emergency Department (HOSPITAL_COMMUNITY)
Admission: EM | Admit: 2021-06-23 | Discharge: 2021-06-24 | Disposition: A | Discharge status: Home / Self Care | Payer: Medicare Other | Attending: Emergency Medicine | Admitting: Emergency Medicine

## 2021-06-23 ENCOUNTER — Encounter (HOSPITAL_COMMUNITY): Payer: Self-pay

## 2021-06-23 DIAGNOSIS — M7989 Other specified soft tissue disorders: Secondary | ICD-10-CM | POA: Insufficient documentation

## 2021-06-23 DIAGNOSIS — M79662 Pain in left lower leg: Secondary | ICD-10-CM | POA: Insufficient documentation

## 2021-06-23 DIAGNOSIS — Z765 Malingerer [conscious simulation]: Secondary | ICD-10-CM

## 2021-06-23 DIAGNOSIS — Z85820 Personal history of malignant melanoma of skin: Secondary | ICD-10-CM | POA: Diagnosis not present

## 2021-06-23 DIAGNOSIS — E119 Type 2 diabetes mellitus without complications: Secondary | ICD-10-CM | POA: Diagnosis not present

## 2021-06-23 DIAGNOSIS — Z85841 Personal history of malignant neoplasm of brain: Secondary | ICD-10-CM | POA: Diagnosis not present

## 2021-06-23 DIAGNOSIS — R600 Localized edema: Secondary | ICD-10-CM | POA: Diagnosis not present

## 2021-06-23 DIAGNOSIS — R609 Edema, unspecified: Secondary | ICD-10-CM | POA: Diagnosis not present

## 2021-06-23 LAB — URINE CULTURE: Culture: NO GROWTH

## 2021-06-23 NOTE — ED Triage Notes (Signed)
Patient BIB EMS with complaint of leg swelling.

## 2021-06-23 NOTE — ED Provider Notes (Signed)
[facility] Provider Note  CSN: [id number] Arrival date & time: 06/23/21 2039    History Chief Complaint  Patient presents with   Leg Swelling    [name] is a 50 y.o. female well known to this department with history of homelessness and malingering is here for the 12th visit this month.She is complaining of leg swelling, urine and gi issues but is not more specific. She is argumentative, tangential and unable to give clear answers to even yes/no questions. This is her baseline per recent charts.    Past Medical History:  Diagnosis Date   Allergy    Anemia    Anemia    Diabetes mellitus without complication ([place])    prediabetic   GERD (gastroesophageal reflux disease)    melanoma with met dz dx'd 01/2018   brain and adrenal   Seasonal allergies     Past Surgical History:  Procedure Laterality Date   BIOPSY  01/28/2018   Procedure: BIOPSY;  Surgeon: Laurence Spates, MD;  Location: [facility];  Service: Endoscopy;;   BREAST BIOPSY Left    ESOPHAGOGASTRODUODENOSCOPY N/A 01/28/2018   Procedure: ESOPHAGOGASTRODUODENOSCOPY (EGD);  Surgeon: Laurence Spates, MD;  Location: [facility];  Service: Endoscopy;  Laterality: N/A;   MOUTH SURGERY     OVARY SURGERY     removed "something"    Family History  Problem Relation Age of Onset   Melanoma Mother    Hypertension Father    Breast cancer Maternal Aunt    Breast cancer Paternal Aunt    Diabetes Paternal Aunt    Breast cancer Maternal Grandmother    Breast cancer Paternal Grandmother    Diabetes Paternal Grandmother    Colon cancer Neg Hx    Esophageal cancer Neg Hx    Pancreatic cancer Neg Hx    Stomach cancer Neg Hx    Liver disease Neg Hx    Rectal cancer Neg Hx     Social History   Tobacco Use   Smoking status: Never   Smokeless tobacco: Never  Vaping Use   Vaping Use: Never used  Substance Use Topics   Alcohol use: No   Drug use: No     Home Medications Prior to  Admission medications   Medication Sig Start Date End Date Taking? Authorizing Provider  clotrimazole (LOTRIMIN) 1 % cream Apply to affected area 2 times daily 06/18/21   Petrucelli, Samantha R, PA-C  fluticasone (FLONASE) 50 MCG/ACT nasal spray Place 2 sprays into both nostrils daily. 06/21/21   Horton, Barbette Hair, MD  hydrocortisone cream 1 % Apply to affected area 2 times daily 06/21/21   Regan Lemming, MD     Allergies    Patient has no known allergies.   Review of Systems   Review of Systems Unable to assess due to mental status.    Physical Exam BP 131/79 (BP Location: Right Arm)    Pulse 79    Temp 98 F (36.7 C) (Oral)    Resp 18    SpO2 100%   Physical Exam Vitals and nursing note reviewed.  Constitutional:      Appearance: Normal appearance.  HENT:     Head: Normocephalic and atraumatic.     Nose: Nose normal.     Mouth/Throat:     Mouth: Mucous membranes are moist.  Eyes:     Extraocular Movements: Extraocular movements intact.     Conjunctiva/sclera: Conjunctivae normal.  Cardiovascular:     Rate and Rhythm: Normal  Pulmonary:  °   Effort: Pulmonary effort is normal.  °   Breath sounds: Normal breath sounds.  °Abdominal:  °   General: Abdomen is flat.  °   Palpations: Abdomen is soft.  °   Tenderness: There is no abdominal tenderness.  °Musculoskeletal:     °   General: No swelling. Normal range of motion.  °   Cervical back: Neck supple.  °   Right lower leg: No edema.  °   Left lower leg: Edema present.  °Skin: °   General: Skin is warm and dry.  °Neurological:  °   General: No focal deficit present.  °   Mental Status: She is alert.  °Psychiatric:     °   Mood and Affect: Mood normal.  ° ° ° °ED Results / Procedures / Treatments   °Labs °(all labs ordered are listed, but only abnormal results are displayed) °Labs Reviewed - No data to display ° °EKG °None ° °Radiology °No results found. ° °Procedures °Procedures ° °Medications Ordered in the ED °Medications -  No data to display ° ° °MDM Rules/Calculators/A&P °MDM °Patient has no acute process and no indication for additional ED workup today. She admits all of her symptoms are chronic. She was offered a place to sleep in the ED with planned discharged to [facility] in the AM but she became very angry, accusing me of 'disrespecting' her and saying that I could not force her to leave. She has been escorted out of the ED by security many times in the past. She was advised that as long as she is cooperative and respectful to ED staff she can sleep her overnight but that she will be discharged in the AM whether to [facility] or not. Care of the patient will be signed out to Night Shift. ° °ED Course  °I have reviewed the triage vital signs and the nursing notes. ° °Pertinent labs & imaging results that were available during my care of the patient were reviewed by me and considered in my medical decision making (see chart for details). ° °  ° °Final Clinical Impression(s) / ED Diagnoses °Final diagnoses:  °None  ° ° °Rx / DC Orders °ED Discharge Orders   ° ° None  ° °  ° °  °,  B, MD °06/23/21 2155 ° °

## 2021-06-23 NOTE — ED Notes (Signed)
Patient's Legal Guardian/Social worker contacted for update per request. Guardian requested to speak with patient about being provided a hotel room to go to in the morning. Patient stated she does not have a guardian or Education officer, museum and declined offer from Education officer, museum to go to [place] room being provided. Pt was not willing to speak with social worker over the phone. Social worker informed of pt's wishes.

## 2021-06-24 ENCOUNTER — Encounter (HOSPITAL_COMMUNITY): Payer: Self-pay

## 2021-06-24 ENCOUNTER — Other Ambulatory Visit: Payer: Self-pay

## 2021-06-24 ENCOUNTER — Emergency Department (HOSPITAL_BASED_OUTPATIENT_CLINIC_OR_DEPARTMENT_OTHER): Payer: Medicare Other

## 2021-06-24 ENCOUNTER — Emergency Department (HOSPITAL_COMMUNITY): Payer: Medicare Other

## 2021-06-24 ENCOUNTER — Emergency Department (HOSPITAL_COMMUNITY)
Admission: EM | Admit: 2021-06-24 | Discharge: 2021-06-24 | Disposition: A | Discharge status: Home / Self Care | Payer: Medicare Other | Source: Home / Self Care | Attending: Emergency Medicine | Admitting: Emergency Medicine

## 2021-06-24 DIAGNOSIS — Z85820 Personal history of malignant melanoma of skin: Secondary | ICD-10-CM | POA: Insufficient documentation

## 2021-06-24 DIAGNOSIS — M7989 Other specified soft tissue disorders: Secondary | ICD-10-CM | POA: Diagnosis not present

## 2021-06-24 DIAGNOSIS — E119 Type 2 diabetes mellitus without complications: Secondary | ICD-10-CM | POA: Insufficient documentation

## 2021-06-24 DIAGNOSIS — M79662 Pain in left lower leg: Secondary | ICD-10-CM | POA: Insufficient documentation

## 2021-06-24 DIAGNOSIS — Z85841 Personal history of malignant neoplasm of brain: Secondary | ICD-10-CM | POA: Insufficient documentation

## 2021-06-24 MED ORDER — ACETAMINOPHEN 500 MG PO TABS
1000.0000 mg | ORAL_TABLET | Freq: Once | ORAL | Status: AC
  Administered 2021-06-24: 08:00:00 1000 mg via ORAL
  Filled 2021-06-24: qty 2

## 2021-06-24 NOTE — ED Provider Notes (Signed)
°[facility]-EMERGENCY DEPT °Provider Note ° ° °CSN: [id number] °Arrival date & time: 06/24/21  0721 ° °  ° °History °Chief Complaint  °Patient presents with  ° Homeless  ° Leg Swelling  ° ° °Kaitlyn Duncan is a 50 y.o. female. ° °HPI °Patient seen multiple times with multiple complaints.  Patient was just discharged this morning.  Patient has many of the same chronic complaints including "problems with my GI system", "problems with my urinary system".  Patient also points out concern for swelling in her left ankle.  She reports its more swollen than the right.  She is not sure exactly how long its been present but somewhere between weeks to a month.  She had a prior bruising injury to both knees which is documented in the chart.  She denies trauma since that time. 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  ° Melanoma of skin (HCC) 05/29/2018  ° Goals of care, counseling/discussion 05/29/2018  ° Brain metastasis (HCC) 01/26/2018  ° ° °Past Surgical History:  °Procedure Laterality Date  ° BIOPSY  01/28/2018  ° Procedure: BIOPSY;  Surgeon: Edwards, James, MD;  Location: [facility];  Service: Endoscopy;;  ° BREAST BIOPSY Left   ° ESOPHAGOGASTRODUODENOSCOPY N/A 01/28/2018  ° Procedure: ESOPHAGOGASTRODUODENOSCOPY (EGD);  Surgeon: Edwards, James, MD;  Location: [facility];  Service: Endoscopy;  Laterality: N/A;  ° MOUTH SURGERY     ° OVARY SURGERY    ° removed "something"  °  ° °OB History   ° ° Gravida  °0  ° Para  °0  ° Term  °0  ° Preterm  °0  ° AB  °0  ° Living  °0  °  ° ° SAB  °0  ° IAB  °0  ° Ectopic  °0  ° Multiple  °0  ° Live Births  °0  °   °  °  ° ° °Family History  °Problem Relation Age of Onset  ° Melanoma Mother   ° Hypertension Father   ° Breast cancer Maternal Aunt   ° Breast cancer Paternal Aunt   ° Diabetes Paternal Aunt   ° Breast cancer Maternal Grandmother   ° Breast cancer Paternal Grandmother   ° Diabetes Paternal Grandmother   ° Colon cancer Neg Hx   ° Esophageal cancer Neg Hx   ° Pancreatic cancer Neg Hx   ° Stomach cancer Neg Hx   ° Liver disease Neg Hx   ° Rectal cancer Neg Hx   ° ° °Social History  ° °Tobacco Use  ° Smoking status: Never  ° Smokeless tobacco: Never  °Vaping Use  ° Vaping Use: Never used  °Substance Use Topics  ° Alcohol use: No  ° Drug use: No  ° ° °Home Medications °Prior to Admission medications   °Medication Sig Start Date End Date Taking? Authorizing   Provider  °clotrimazole (LOTRIMIN) 1 % cream Apply to affected area 2 times daily 06/18/21   Petrucelli, Samantha R, PA-C  °fluticasone (FLONASE) 50 MCG/ACT nasal spray Place 2 sprays into both nostrils daily. 06/21/21   Horton, Courtney F, MD  °hydrocortisone cream 1 % Apply to affected area 2 times daily 06/21/21   Lawsing, James, MD  ° ° °Allergies    °Patient has no known allergies. ° °Review of Systems   °Review of Systems °10 systems reviewed and negative except as per HPI °Physical Exam °Updated Vital Signs °BP (!) 146/91 (BP Location: Left Arm)    Pulse 88    Temp 98 °F (36.7 °C) (Oral)    Resp 18    SpO2 94%  ° °Physical Exam °Constitutional:   °   Comments: Alert nontoxic clinically well in appearance.  No acute distress  °HENT:  °   Head: Normocephalic and atraumatic.  °   Mouth/Throat:  °   Pharynx: Oropharynx is clear.  °Eyes:  °   Extraocular Movements: Extraocular movements intact.  °Cardiovascular:  °   Rate and Rhythm: Normal rate  and regular rhythm.  °Pulmonary:  °   Effort: Pulmonary effort is normal.  °   Breath sounds: Normal breath sounds.  °Abdominal:  °   General: There is no distension.  °   Palpations: Abdomen is soft.  °   Tenderness: There is no abdominal tenderness. There is no guarding.  °Musculoskeletal:  °   Comments: Left lower extremity mild to moderate swelling of the left ankle.  Left lower leg and calf are mildly swollen relative to the right.  Dorsalis pedis pulses are 2+ and symmetric in the feet.  Feet do not have any significant injuries.  They are poorly maintained with long toenails and slightly dirty but not macerated skin condition or active wounds.  Side-by-side, left lower extremity is objectively more swollen throughout the calf and lower leg compared to the right.  The bruising is seen on both knees on previous visits is nearly resolved.  The left knee does not show effusion or appearance of any joint swelling or redness.  Patient is easily flexing and extending spontaneously at the knee without discomfort.  °Skin: °   General: Skin is warm and dry.  °Neurological:  °   General: No focal deficit present.  °   Mental Status: She is oriented to person, place, and time.  °Psychiatric:  °   Comments: At this time, patient is calm and cooperative.  ° ° °ED Results / Procedures / Treatments   °Labs °(all labs ordered are listed, but only abnormal results are displayed) °Labs Reviewed - No data to display ° °EKG °None ° °Radiology °DG Ankle Complete Left ° °Result Date: 06/24/2021 °CLINICAL DATA:  Ankle swelling.  No reported injury. EXAM: LEFT ANKLE COMPLETE - 3+ VIEW COMPARISON:  None. FINDINGS: Nonspecific soft tissue swelling. No evidence of fracture, dislocation or degenerative change. IMPRESSION: Nonspecific soft tissue swelling.  Otherwise negative. Electronically Signed   By: Mark  Shogry M.D.   On: 06/24/2021 08:20  ° °VAS US LOWER EXTREMITY VENOUS (DVT) (7a-7p) ° °Result Date: 06/24/2021 ° Lower Venous DVT  Study Patient Name:  Kaitlyn Duncan  Date of Exam:   06/24/2021 Medical Rec #: [id number]              Accession #:    [id number] Date of Birth: 09/24/1970                 Patient Gender: F Patient Age:   50 years Exam Location:  [facility] Procedure:      VAS US LOWER EXTREMITY VENOUS (DVT) Referring Phys:   --------------------------------------------------------------------------------  Indications: Swelling.  Comparison Study: No previous exa,s Performing Technologist: Jody Hill RVT, RDMS  Examination Guidelines: A complete evaluation includes B-mode imaging, spectral Doppler, color Doppler, and power Doppler as needed of all accessible portions of each vessel. Bilateral testing is considered an integral part of a complete examination. Limited examinations for reoccurring indications may be performed as noted. The reflux portion of the exam is performed with the patient in reverse Trendelenburg.  +-----+---------------+---------+-----------+----------+--------------+  RIGHT Compressibility Phasicity Spontaneity Properties Thrombus Aging  +-----+---------------+---------+-----------+----------+--------------+  CFV   Full            Yes       Yes                                    +-----+---------------+---------+-----------+----------+--------------+   +---------+---------------+---------+-----------+----------+--------------+  LEFT      Compressibility Phasicity Spontaneity Properties Thrombus Aging  +---------+---------------+---------+-----------+----------+--------------+  CFV       Full            Yes       Yes                                    +---------+---------------+---------+-----------+----------+--------------+  SFJ       Full                                                             +---------+---------------+---------+-----------+----------+--------------+  FV Prox   Full            Yes       Yes                                     +---------+---------------+---------+-----------+----------+--------------+  FV Mid    Full            Yes       Yes                                    +---------+---------------+---------+-----------+----------+--------------+  FV Distal Full            Yes       Yes                                    +---------+---------------+---------+-----------+----------+--------------+  PFV       Full                                                             +---------+---------------+---------+-----------+----------+--------------+  POP       Full              Yes       Yes                                    +---------+---------------+---------+-----------+----------+--------------+  PTV       Full                                                             +---------+---------------+---------+-----------+----------+--------------+  PERO      Full                                                             +---------+---------------+---------+-----------+----------+--------------+     Summary: RIGHT: - No evidence of common femoral vein obstruction.  LEFT: - There is no evidence of deep vein thrombosis in the lower extremity. - There is no evidence of superficial venous thrombosis.  - No cystic structure found in the popliteal fossa.  *See table(s) above for measurements and observations.    Preliminary     Procedures Procedures   Medications Ordered in ED Medications  acetaminophen (TYLENOL) tablet 1,000 mg (1,000 mg Oral Given 06/24/21 0759)    ED Course  I have reviewed the triage vital signs and the nursing notes.  Pertinent labs & imaging results that were available during my care of the patient were reviewed by me and considered in my medical decision making (see chart for details). Patient presents to the emergency department with recurrent chronic complaints.  By evaluation, no objective signs of acute abdominal findings.  Her abdomen is soft and nontender.  Vital signs have been stable without  clinical signs of dehydration.  She has complicated medical and social history.  EMR reviewed.  Patient has frequent ED visits.  She does have history of excessively treated metastatic melanoma.  She has been under the care of Dr. Alen Blew.  CT chest abdomen pelvis done this year at the beginning of the year.  Patient has had multiple CT scans of the head.  To date no evidence of reactivation of melanoma.  Today, patient points out swelling of her left lower extremity.  There is objective asymmetry.  Extremity is not warm or showing any appearance of active wounds or cellulitis.  Patient did have significant bruising from a fall documented earlier.  New or acute injuries reported.  No new appearing bruising or effusions of the joints.  We will proceed with ultrasound left lower extremity to rule out DVT and x-rays of the ankle.   DVT study negative.  X-ray negative.  Patient is ambulatory on the extremity.  Most likely asymmetric swelling is a result of prior injury documented in chart.  My current recommendations for conservative management with rest elevation and ice.  I recommend follow-up with both a PCP and surveillance for patient's melanoma per Dr. Hazeline Junker recommendations.  At this time, clinically no indication of recurrence or need for further diagnostic evaluation. MDM Rules/Calculators/A&P  Final Clinical Impression(s) / ED Diagnoses Final diagnoses:  Pain and swelling of lower leg, left    Rx / DC Orders ED Discharge Orders     None        Charlesetta Shanks, MD 06/24/21 1002

## 2021-06-24 NOTE — Progress Notes (Signed)
LLE venous duplex has been completed.  Preliminary results messaged to Dr. Johnney Killian via secure chat.  Results can be found under chart review under CV PROC. 06/24/2021 8:47 AM Xavier Fournier RVT, RDMS

## 2021-06-24 NOTE — Progress Notes (Signed)
.  Transition of Care [facility]) - Emergency Department Mini Assessment   Patient Details  Name: Kaitlyn Duncan MRN: [id number] Date of Birth: 08/27/1970  [facility] CM/SW Contact:    Illene Regulus, LCSW Phone Number: 06/24/2021, 9:51 AM   Clinical Narrative:  TOC CSW spoke with [name] Kaitlyn Duncan, she is requesting CSW to speak with pt to get pt to agree to d/c to a hotel DSS is providing.   CSW spoke with pt about discharging to a hotel room. Pt is refusing [facility] she [facility] that [facility] is nasty ". CSW informed pt that she will be d/c today to [facility] if she refuses the hotel room.  CSW informed pt LG of pt's decision. Pt LG stated she will come to the hospital to pick pt up upon d/c.   10:30 am Upon pt's d/c pt became combative stating she does not have a legal Guardian. Security attempted to escort her off the premises. CSW spoke with pt to inform her of the hotel room DSS is willing to pay for. Pt provided CSW with three different hotels she prefers. CSW informed [name]. [name] stated she will be here to get pt within the hr. pt is sitting in the lobby with security standing by.      ED Mini Assessment: What brought you to the Emergency Department? : GI issues  Barriers to Discharge: No Barriers Identified     Means of departure: Car  Interventions which prevented an admission or readmission: Transportation Screening, Homeless Screening    Patient Contact and Communications        ,            CMS Medicare.gov Compare Post Acute Care list provided to:: Legal Guardian    Admission diagnosis:  swelling in feet, leg, ankle, UTI, GI Issues Patient Active Problem List   Diagnosis Date Noted   Has difficulty accessing primary care provider for most visits 06/15/2021   Paranoia ([place])    Malingering 01/23/2021   Candidiasis of skin 01/23/2021   Musculoskeletal pain 01/23/2021   Psychosis ([place])    Adjustment  disorder with mixed disturbance of emotions and conduct 10/07/2020   Melanoma of skin ([place]) 05/29/2018   Goals of care, counseling/discussion 05/29/2018   Brain metastasis ([place]) 01/26/2018   PCP:  Pcp, No Pharmacy:   [facility], [address] [place] [address] Phone: [phone] Fax: [phone]

## 2021-06-24 NOTE — ED Notes (Signed)
Pt required security and [employer] police escort out of emergency department due to pt refusing to leave after all care was completed. I provided reinforcement of provider discharge instructions. Pt refused for me to assess discharge vital signs.

## 2021-06-24 NOTE — ED Notes (Signed)
Pt ambulatory without assistance.  

## 2021-06-24 NOTE — ED Triage Notes (Signed)
Pt presents reporting ongoing GI and urinary issues. Pt was here last night and was discharged and escorted off the property by security for trespassing. Pt was given resources regarding homelessness and declined them.

## 2021-06-24 NOTE — Discharge Instructions (Signed)
1.  Your left leg was evaluated with an ultrasound to look for blood clot.  There is no blood clot present.  You had x-rays of the ankle to evaluate for any fracture.  There is no fracture present.  At this time I suspect your swelling is due to your prior injury several weeks ago and still requires elevating, icing and rest.  Follow discharge instructions for elevating and rest.  You may take extra strength Tylenol over-the-counter for pain.  You should schedule a follow-up appointment with your family doctor for recheck.  If you do not have a family doctor use the resource guide including your discharge instructions. 2.  You should continue your recommended follow-up with your oncologist, Dr. Alen Blew.  Make sure you keep all of your appointments as recommended for surveillance of your prior history of melanoma.

## 2021-06-25 ENCOUNTER — Other Ambulatory Visit: Payer: Self-pay

## 2021-06-25 ENCOUNTER — Emergency Department (HOSPITAL_COMMUNITY)
Admission: EM | Admit: 2021-06-25 | Discharge: 2021-06-26 | Disposition: A | Discharge status: Other Acute Inpatient Hospital | Payer: Medicare Other | Attending: Emergency Medicine | Admitting: Emergency Medicine

## 2021-06-25 ENCOUNTER — Encounter (HOSPITAL_COMMUNITY): Payer: Self-pay | Admitting: Emergency Medicine

## 2021-06-25 ENCOUNTER — Encounter: Payer: Self-pay | Admitting: Oncology

## 2021-06-25 DIAGNOSIS — N9489 Other specified conditions associated with female genital organs and menstrual cycle: Secondary | ICD-10-CM | POA: Diagnosis not present

## 2021-06-25 DIAGNOSIS — Z85841 Personal history of malignant neoplasm of brain: Secondary | ICD-10-CM | POA: Diagnosis not present

## 2021-06-25 DIAGNOSIS — E119 Type 2 diabetes mellitus without complications: Secondary | ICD-10-CM | POA: Insufficient documentation

## 2021-06-25 DIAGNOSIS — Z79899 Other long term (current) drug therapy: Secondary | ICD-10-CM | POA: Insufficient documentation

## 2021-06-25 DIAGNOSIS — Z046 Encounter for general psychiatric examination, requested by authority: Secondary | ICD-10-CM | POA: Diagnosis present

## 2021-06-25 DIAGNOSIS — Z85820 Personal history of malignant melanoma of skin: Secondary | ICD-10-CM | POA: Insufficient documentation

## 2021-06-25 DIAGNOSIS — Z20822 Contact with and (suspected) exposure to covid-19: Secondary | ICD-10-CM | POA: Insufficient documentation

## 2021-06-25 DIAGNOSIS — Z85858 Personal history of malignant neoplasm of other endocrine glands: Secondary | ICD-10-CM | POA: Insufficient documentation

## 2021-06-25 DIAGNOSIS — F29 Unspecified psychosis not due to a substance or known physiological condition: Secondary | ICD-10-CM | POA: Insufficient documentation

## 2021-06-25 LAB — COMPREHENSIVE METABOLIC PANEL
ALT: 23 U/L (ref 0–44)
AST: 31 U/L (ref 15–41)
Albumin: 3.7 g/dL (ref 3.5–5.0)
Alkaline Phosphatase: 68 U/L (ref 38–126)
Anion gap: 3 — ABNORMAL LOW (ref 5–15)
BUN: 21 mg/dL — ABNORMAL HIGH (ref 6–20)
CO2: 25 mmol/L (ref 22–32)
Calcium: 8.4 mg/dL — ABNORMAL LOW (ref 8.9–10.3)
Chloride: 108 mmol/L (ref 98–111)
Creatinine, Ser: 0.5 mg/dL (ref 0.44–1.00)
GFR, Estimated: >60 mL/min (ref >60)
Glucose, Bld: 89 mg/dL (ref 70–99)
Potassium: 3.8 mmol/L (ref 3.5–5.1)
Sodium: 136 mmol/L (ref 135–145)
Total Bilirubin: 0.5 mg/dL (ref 0.3–1.2)
Total Protein: 6.5 g/dL (ref 6.5–8.1)

## 2021-06-25 LAB — RESP PANEL BY RT-PCR (FLU A&B, COVID) ARPGX2
Influenza A by PCR: NEGATIVE
Influenza B by PCR: NEGATIVE
SARS Coronavirus 2 by RT PCR: NEGATIVE

## 2021-06-25 LAB — CBC WITH DIFFERENTIAL/PLATELET
Abs Immature Granulocytes: 0.02 10*3/uL (ref 0.00–0.07)
Basophils Absolute: 0 10*3/uL (ref 0.0–0.1)
Basophils Relative: 0 %
Eosinophils Absolute: 0 10*3/uL (ref 0.0–0.5)
Eosinophils Relative: 0 %
HCT: 37.1 % (ref 36.0–46.0)
Hemoglobin: 11.7 g/dL — ABNORMAL LOW (ref 12.0–15.0)
Immature Granulocytes: 1 %
Lymphocytes Relative: 21 %
Lymphs Abs: 0.8 10*3/uL (ref 0.7–4.0)
MCH: 27.8 pg (ref 26.0–34.0)
MCHC: 31.5 g/dL (ref 30.0–36.0)
MCV: 88.1 fL (ref 80.0–100.0)
Monocytes Absolute: 0.4 10*3/uL (ref 0.1–1.0)
Monocytes Relative: 10 %
Neutro Abs: 2.7 10*3/uL (ref 1.7–7.7)
Neutrophils Relative %: 68 %
Platelets: 60 10*3/uL — ABNORMAL LOW (ref 150–400)
RBC: 4.21 MIL/uL (ref 3.87–5.11)
RDW: 13.5 % (ref 11.5–15.5)
WBC: 3.9 10*3/uL — ABNORMAL LOW (ref 4.0–10.5)
nRBC: 0 % (ref 0.0–0.2)

## 2021-06-25 LAB — I-STAT BETA HCG BLOOD, ED (MC, WL, AP ONLY): I-stat hCG, quantitative: <5 m[IU]/mL (ref <5)

## 2021-06-25 LAB — ETHANOL: Alcohol, Ethyl (B): <10 mg/dL (ref <10)

## 2021-06-25 NOTE — Progress Notes (Signed)
CSW sent to [facility] Wynonia Hazard, RN to review for inpatient behavioral health placement. CSW will assist and follow for bed placement.   Benjaman Kindler, MSW, [facility] 06/25/2021 11:42 PM

## 2021-06-25 NOTE — ED Notes (Signed)
Pt BIB [employer] [facility] Response Team from Extended Stay. Pt social work Derek Mound [phone] called [facility]RT ([place] [facility]) and provided information to [facility] for pt to be IVC'd. IVC states the following.  "PETITIONER STATES: THE RESPONDENT HAS BARRICADED HERSELF INTO THE BATHROOM OF THE EXTENDED STAY HOTEL AND REFUSES TO COME OUT. THE RESPONDENT IS HALLUCINATING, WHEN OFFICERS TRIED TO SPEAK WITH HER SHE CALLED THEM THE DEVIL. THE RESPONDENT IS RESPONDING TO INTERNAL [employer]I. THE RESPONDENT CONTINUOUSLY CHANTS AND SPEAKING IN AN NONSENSICAL MANNER. THE RESPONDENT APPEARS TO NOT BE IN TOUCH WITH REALITY. THE RESPONDENT HAS BEEN COMMITTED PREVIOUSLY."  Pt is sitting in a wheelchair chanting nonsensically and periodically yelling out at [facility].
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
## 2021-06-25 NOTE — ED Notes (Signed)
Patient noted to be less than cooperative with care, stating that she doesn't need and EKG because she "just had one at [facility]." Patient encouraged to participate in care. Patient able to be verbally redirected and is now minimally cooperative care, still refusing to change into hospital scrubs.

## 2021-06-25 NOTE — ED Provider Notes (Signed)
[facility] DEPT Provider Note   CSN: [id number] Arrival date & time: 06/25/21  1709     History Chief Complaint  Patient presents with   Psychiatric Evaluation    Kaitlyn Duncan is a 50 y.o. female with history of paranoia and psychosis who presents to the emergency department under IVC.  Petitioner states that patient was at an extended stay hotel, and barricaded herself in the bathroom refusing to come out.  When off pressors attempted to speak to her, she called them the devil.  It appeared that she was responding to internal stimuli, continuously chanting and speaking in a nonsensical manner.  Petitioner had concern patient appeared to not be in touch with reality.  Patient is complaining of "GI issues", and states she was evaluated for this previously in the emergency department, and a procedure was promised to her but was not done.  HPI     Past Medical History:  Diagnosis Date   Allergy    Anemia    Anemia    Diabetes mellitus without complication ([place])    prediabetic   GERD (gastroesophageal reflux disease)    melanoma with met dz dx'd 01/2018   brain and adrenal   Seasonal allergies     Patient Active Problem List   Diagnosis Date Noted   Has difficulty accessing primary care provider for most visits 06/15/2021   Paranoia ([place])    Malingering 01/23/2021   Candidiasis of skin 01/23/2021   Musculoskeletal pain 01/23/2021   Psychosis ([place])    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   Melanoma of skin ([place]) 05/29/2018   Goals of care, counseling/discussion 05/29/2018   Brain metastasis ([place]) 01/26/2018    Past Surgical History:  Procedure Laterality Date   BIOPSY  01/28/2018   Procedure: BIOPSY;  Surgeon: Laurence Spates, MD;  Location: [facility];  Service: Endoscopy;;   BREAST BIOPSY Left    ESOPHAGOGASTRODUODENOSCOPY N/A 01/28/2018   Procedure: ESOPHAGOGASTRODUODENOSCOPY (EGD);   Surgeon: Laurence Spates, MD;  Location: [facility];  Service: Endoscopy;  Laterality: N/A;   MOUTH SURGERY     OVARY SURGERY     removed "something"     OB History     Gravida  0   Para  0   Term  0   Preterm  0   AB  0   Living  0      SAB  0   IAB  0   Ectopic  0   Multiple  0   Live Births  0           Family History  Problem Relation Age of Onset   Melanoma Mother    Hypertension Father    Breast cancer Maternal Aunt    Breast cancer Paternal Aunt    Diabetes Paternal Aunt    Breast cancer Maternal Grandmother    Breast cancer Paternal Grandmother    Diabetes Paternal Grandmother    Colon cancer Neg Hx    Esophageal cancer Neg Hx    Pancreatic cancer Neg Hx    Stomach cancer Neg Hx    Liver disease Neg Hx    Rectal cancer Neg Hx     Social History   Tobacco Use   Smoking status: Never   Smokeless tobacco: Never  Vaping Use   Vaping Use: Never used  Substance Use Topics   Alcohol use: No   Drug use: No    Home Medications Prior to  Admission medications   Medication Sig Start Date End Date Taking? Authorizing Provider  clotrimazole (LOTRIMIN) 1 % cream Apply to affected area 2 times daily Patient not taking: Reported on 06/25/2021 06/18/21   Petrucelli, Samantha R, PA-C  fluticasone (FLONASE) 50 MCG/ACT nasal spray Place 2 sprays into both nostrils daily. Patient not taking: Reported on 06/25/2021 06/21/21   Merryl Hacker, MD  hydrocortisone cream 1 % Apply to affected area 2 times daily Patient not taking: Reported on 06/25/2021 06/21/21   Regan Lemming, MD    Allergies    Patient has no known allergies.  Review of Systems   Review of Systems  Gastrointestinal:  Positive for abdominal pain and diarrhea.  Genitourinary:  Positive for frequency.  Psychiatric/Behavioral:  Positive for behavioral problems and hallucinations. Negative for suicidal ideas.   All other systems reviewed and are negative.  Physical Exam Updated  Vital Signs BP (!) 134/91 (BP Location: Right Arm)    Pulse 100    Temp 97.9 F (36.6 C) (Oral)    Resp 18    SpO2 99%   Physical Exam Vitals and nursing note reviewed.  Constitutional:      Appearance: Normal appearance.  HENT:     Head: Normocephalic and atraumatic.  Eyes:     Conjunctiva/sclera: Conjunctivae normal.  Cardiovascular:     Rate and Rhythm: Normal rate and regular rhythm.  Pulmonary:     Effort: Pulmonary effort is normal. No respiratory distress.     Breath sounds: Normal breath sounds.  Abdominal:     General: There is no distension.     Palpations: Abdomen is soft.     Tenderness: There is no abdominal tenderness.  Skin:    General: Skin is warm and dry.  Neurological:     General: No focal deficit present.     Mental Status: She is alert.  Psychiatric:        Attention and Perception: She perceives auditory hallucinations. She does not perceive visual hallucinations.        Mood and Affect: Affect is angry.        Speech: Speech is rapid and pressured.        Behavior: Behavior is agitated.        Thought Content: Thought content is paranoid. Thought content does not include homicidal or suicidal ideation.     Comments: Patient continues to iterate that she is here against her will as part of "illegal detainment".  She states that her social worker that committed is "a witch", and she does not need her anyways. States she hears the voice of [name] and sometimes voices of demons and the devil.     ED Results / Procedures / Treatments   Labs (all labs ordered are listed, but only abnormal results are displayed) Labs Reviewed  COMPREHENSIVE METABOLIC PANEL - Abnormal; Notable for the following components:      Result Value   BUN 21 (*)    Calcium 8.4 (*)    Anion gap 3 (*)    All other components within normal limits  CBC WITH DIFFERENTIAL/PLATELET - Abnormal; Notable for the following components:   WBC 3.9 (*)    Hemoglobin 11.7 (*)    Platelets 60 (*)     All other components within normal limits  RESP PANEL BY RT-PCR (FLU A&B, COVID) ARPGX2  ETHANOL  RAPID URINE DRUG SCREEN, HOSP PERFORMED  URINALYSIS, ROUTINE W REFLEX MICROSCOPIC  I-STAT BETA HCG BLOOD, ED ([facility], [facility], [facility] ONLY)  EKG ED ECG REPORT   Date: 06/25/2021  Rate: 65  Rhythm: normal sinus rhythm  QRS Axis: normal  Intervals: normal  ST/T Wave abnormalities: normal  Conduction Disutrbances:none  Narrative Interpretation:   Old EKG Reviewed: unchanged  I have personally reviewed the EKG tracing and agree with the computerized printout as noted. Confirmed by attending physician Dr. Effie Shy.   Radiology DG Ankle Complete Left  Result Date: 06/24/2021 CLINICAL DATA:  Ankle swelling.  No reported injury. EXAM: LEFT ANKLE COMPLETE - 3+ VIEW COMPARISON:  None. FINDINGS: Nonspecific soft tissue swelling. No evidence of fracture, dislocation or degenerative change. IMPRESSION: Nonspecific soft tissue swelling.  Otherwise negative. Electronically Signed   By: Paulina Fusi M.D.   On: 06/24/2021 08:20   VAS [place] LOWER EXTREMITY VENOUS (DVT) (7a-7p)  Result Date: 06/24/2021  Lower Venous DVT Study Patient Name:  GISSELE NARDUCCI [facility]  Date of Exam:   06/24/2021 Medical Rec #: [id number]              Accession #:    [id number] Date of Birth: Mar 12, 1971               Patient Gender: F Patient Age:   11 years Exam Location:  [facility] Procedure:      VAS [place] LOWER EXTREMITY VENOUS (DVT) Referring Phys: [facility] PFEIFFER --------------------------------------------------------------------------------  Indications: Swelling.  Comparison Study: No previous exa,s Performing Technologist: Jody Hill RVT, RDMS  Examination Guidelines: A complete evaluation includes B-mode imaging, spectral Doppler, color Doppler, and power Doppler as needed of all accessible portions of each vessel. Bilateral testing is considered an integral part of a complete examination. Limited examinations for reoccurring  indications may be performed as noted. The reflux portion of the exam is performed with the patient in reverse Trendelenburg.  +-----+---------------+---------+-----------+----------+--------------+  RIGHT Compressibility Phasicity Spontaneity Properties Thrombus Aging  +-----+---------------+---------+-----------+----------+--------------+  CFV   Full            Yes       Yes                                    +-----+---------------+---------+-----------+----------+--------------+   +---------+---------------+---------+-----------+----------+--------------+  LEFT      Compressibility Phasicity Spontaneity Properties Thrombus Aging  +---------+---------------+---------+-----------+----------+--------------+  CFV       Full            Yes       Yes                                    +---------+---------------+---------+-----------+----------+--------------+  SFJ       Full                                                             +---------+---------------+---------+-----------+----------+--------------+  FV Prox   Full            Yes       Yes                                    +---------+---------------+---------+-----------+----------+--------------+  FV Mid    Full  Yes       Yes                                    +---------+---------------+---------+-----------+----------+--------------+  FV Distal Full            Yes       Yes                                    +---------+---------------+---------+-----------+----------+--------------+  PFV       Full                                                             +---------+---------------+---------+-----------+----------+--------------+  POP       Full            Yes       Yes                                    +---------+---------------+---------+-----------+----------+--------------+  PTV       Full                                                             +---------+---------------+---------+-----------+----------+--------------+  PERO      Full                                                              +---------+---------------+---------+-----------+----------+--------------+    Summary: RIGHT: - No evidence of common femoral vein obstruction.  LEFT: - There is no evidence of deep vein thrombosis in the lower extremity. - There is no evidence of superficial venous thrombosis.  - No cystic structure found in the popliteal fossa.  *See table(s) above for measurements and observations. Electronically signed by Orlie Pollen on 06/24/2021 at 3:48:10 PM.    Final     Procedures Procedures   Medications Ordered in ED Medications - No data to display  ED Course  I have reviewed the triage vital signs and the nursing notes.  Pertinent labs & imaging results that were available during my care of the patient were reviewed by me and considered in my medical decision making (see chart for details).    MDM Rules/Calculators/A&P                          Patient is a 50 year old female under IVC presents the emergency department for psychiatric evaluation.  Paperwork states patient barricaded herself in her bathroom, was verbally aggressive to officers, chanting and speaking in a nonsensical manner.  On my exam patient states that she is "here illegally". She states that officers on scene forcibly removed her from the bathroom and handcuffed her  against her will. She states that because she is a child of [name], that this was illegal and she shouldn't be treated differently because of her beliefs. She reports hearing the voice of [name] and intermittently hearing voices of the devil and other demons. She denies suicidal or homicidal ideation, and denies visual hallucinations.  She is complaining of abdominal pain, diarrhea, and increased urinary urgency. On my exam she is afebrile, and has no abdominal tenderness to palpation, no guarding or rigidity.   Plan to obtain medical clearance labs, to also evaluate patient's abdominal pain. Pending normal  abdominal workup, patient is otherwise medically cleared. Will consult TTS.   10:00pm TTS consult complete. Leandro Reasoner NP recommends inpatient psychiatric treatment. AC at [facility] will review for possible admission.   Final Clinical Impression(s) / ED Diagnoses Final diagnoses:  Psychosis, unspecified psychosis type ([place])    Rx / DC Orders ED Discharge Orders     None      Portions of this report may have been transcribed using voice recognition software. Every effort was made to ensure accuracy; however, inadvertent computerized transcription errors may be present.    Estill Cotta 06/25/21 2308    Daleen Bo, MD 06/25/21 [phone]

## 2021-06-25 NOTE — ED Notes (Signed)
Patient stating she does not need to talk to the assessment team, stating, "I don't need to talk to them, I need someone to address my medical complaints, is anyone going to do that?" Patient unable to specify said complaints. Patient speaking with TTS at this time.

## 2021-06-26 ENCOUNTER — Encounter: Payer: Self-pay | Admitting: Oncology

## 2021-06-26 ENCOUNTER — Other Ambulatory Visit: Payer: Self-pay | Admitting: Psychiatry

## 2021-06-26 ENCOUNTER — Inpatient Hospital Stay (HOSPITAL_COMMUNITY)
Admission: AD | Admit: 2021-06-26 | Discharge: 2021-07-15 | DRG: 885 | Disposition: A | Discharge status: Home / Self Care | Payer: Medicare Other | Source: Intra-hospital | Attending: Psychiatry | Admitting: Psychiatry

## 2021-06-26 DIAGNOSIS — F319 Bipolar disorder, unspecified: Secondary | ICD-10-CM | POA: Diagnosis not present

## 2021-06-26 DIAGNOSIS — R350 Frequency of micturition: Secondary | ICD-10-CM | POA: Diagnosis not present

## 2021-06-26 DIAGNOSIS — Z808 Family history of malignant neoplasm of other organs or systems: Secondary | ICD-10-CM | POA: Diagnosis not present

## 2021-06-26 DIAGNOSIS — Z923 Personal history of irradiation: Secondary | ICD-10-CM | POA: Diagnosis not present

## 2021-06-26 DIAGNOSIS — N9489 Other specified conditions associated with female genital organs and menstrual cycle: Secondary | ICD-10-CM | POA: Diagnosis not present

## 2021-06-26 DIAGNOSIS — Z85820 Personal history of malignant melanoma of skin: Secondary | ICD-10-CM | POA: Diagnosis not present

## 2021-06-26 DIAGNOSIS — E119 Type 2 diabetes mellitus without complications: Secondary | ICD-10-CM | POA: Diagnosis present

## 2021-06-26 DIAGNOSIS — D72819 Decreased white blood cell count, unspecified: Secondary | ICD-10-CM | POA: Diagnosis present

## 2021-06-26 DIAGNOSIS — Z85841 Personal history of malignant neoplasm of brain: Secondary | ICD-10-CM | POA: Diagnosis not present

## 2021-06-26 DIAGNOSIS — K829 Disease of gallbladder, unspecified: Secondary | ICD-10-CM | POA: Diagnosis not present

## 2021-06-26 DIAGNOSIS — C7971 Secondary malignant neoplasm of right adrenal gland: Secondary | ICD-10-CM | POA: Diagnosis not present

## 2021-06-26 DIAGNOSIS — S0990XA Unspecified injury of head, initial encounter: Secondary | ICD-10-CM | POA: Diagnosis not present

## 2021-06-26 DIAGNOSIS — Z20822 Contact with and (suspected) exposure to covid-19: Secondary | ICD-10-CM | POA: Diagnosis present

## 2021-06-26 DIAGNOSIS — Z85858 Personal history of malignant neoplasm of other endocrine glands: Secondary | ICD-10-CM | POA: Diagnosis not present

## 2021-06-26 DIAGNOSIS — N8500 Endometrial hyperplasia, unspecified: Secondary | ICD-10-CM | POA: Diagnosis not present

## 2021-06-26 DIAGNOSIS — C7931 Secondary malignant neoplasm of brain: Secondary | ICD-10-CM | POA: Diagnosis not present

## 2021-06-26 DIAGNOSIS — F23 Brief psychotic disorder: Secondary | ICD-10-CM | POA: Diagnosis present

## 2021-06-26 DIAGNOSIS — K5900 Constipation, unspecified: Secondary | ICD-10-CM | POA: Diagnosis not present

## 2021-06-26 DIAGNOSIS — C7972 Secondary malignant neoplasm of left adrenal gland: Secondary | ICD-10-CM | POA: Diagnosis present

## 2021-06-26 DIAGNOSIS — E876 Hypokalemia: Secondary | ICD-10-CM | POA: Diagnosis not present

## 2021-06-26 DIAGNOSIS — F29 Unspecified psychosis not due to a substance or known physiological condition: Principal | ICD-10-CM | POA: Diagnosis present

## 2021-06-26 DIAGNOSIS — D259 Leiomyoma of uterus, unspecified: Secondary | ICD-10-CM | POA: Diagnosis not present

## 2021-06-26 DIAGNOSIS — C439 Malignant melanoma of skin, unspecified: Secondary | ICD-10-CM | POA: Diagnosis present

## 2021-06-26 DIAGNOSIS — Z833 Family history of diabetes mellitus: Secondary | ICD-10-CM

## 2021-06-26 DIAGNOSIS — D696 Thrombocytopenia, unspecified: Secondary | ICD-10-CM | POA: Diagnosis present

## 2021-06-26 DIAGNOSIS — Z79899 Other long term (current) drug therapy: Secondary | ICD-10-CM | POA: Diagnosis not present

## 2021-06-26 DIAGNOSIS — K7689 Other specified diseases of liver: Secondary | ICD-10-CM | POA: Diagnosis not present

## 2021-06-26 DIAGNOSIS — I700 Atherosclerosis of aorta: Secondary | ICD-10-CM | POA: Diagnosis not present

## 2021-06-26 LAB — URINALYSIS, ROUTINE W REFLEX MICROSCOPIC
Bilirubin Urine: NEGATIVE
Glucose, UA: NEGATIVE mg/dL
Hgb urine dipstick: NEGATIVE
Ketones, ur: NEGATIVE mg/dL
Leukocytes,Ua: NEGATIVE
Nitrite: NEGATIVE
Protein, ur: NEGATIVE mg/dL
Specific Gravity, Urine: 1.015 (ref 1.005–1.030)
pH: 7.5 (ref 5.0–8.0)

## 2021-06-26 LAB — RAPID URINE DRUG SCREEN, HOSP PERFORMED
Amphetamines: NOT DETECTED
Barbiturates: NOT DETECTED
Benzodiazepines: NOT DETECTED
Cocaine: NOT DETECTED
Opiates: NOT DETECTED
Tetrahydrocannabinol: NOT DETECTED

## 2021-06-26 MED ORDER — HALOPERIDOL LACTATE 5 MG/ML IJ SOLN: 5.0000 mg | Freq: Four times a day (QID) | INTRAMUSCULAR | Status: DC | PRN

## 2021-06-26 MED ORDER — LORAZEPAM 1 MG PO TABS
2.0000 mg | ORAL_TABLET | Freq: Four times a day (QID) | ORAL | Status: DC | PRN
  Administered 2021-06-27: 12:00:00 2 mg via ORAL
  Filled 2021-06-26: qty 2

## 2021-06-26 MED ORDER — BENZTROPINE MESYLATE 0.5 MG PO TABS
0.5000 mg | ORAL_TABLET | Freq: Two times a day (BID) | ORAL | Status: DC
  Administered 2021-06-27: 12:00:00 0.5 mg via ORAL
  Filled 2021-06-26 (×7): qty 1

## 2021-06-26 MED ORDER — HALOPERIDOL LACTATE 5 MG/ML IJ SOLN
10.0000 mg | Freq: Three times a day (TID) | INTRAMUSCULAR | Status: DC | PRN
  Filled 2021-06-26: qty 2

## 2021-06-26 MED ORDER — ZIPRASIDONE MESYLATE 20 MG IM SOLR
20.0000 mg | INTRAMUSCULAR | Status: AC | PRN
  Administered 2021-06-26: 13:00:00 20 mg via INTRAMUSCULAR
  Filled 2021-06-26: qty 20

## 2021-06-26 MED ORDER — DIPHENHYDRAMINE HCL 50 MG/ML IJ SOLN: 50.0000 mg | Freq: Four times a day (QID) | INTRAMUSCULAR | Status: DC | PRN

## 2021-06-26 MED ORDER — LORAZEPAM 2 MG/ML IJ SOLN: 2.0000 mg | Freq: Four times a day (QID) | INTRAMUSCULAR | Status: DC | PRN

## 2021-06-26 MED ORDER — OLANZAPINE 10 MG PO TBDP: 10.0000 mg | ORAL_TABLET | Freq: Every day | ORAL | Status: DC

## 2021-06-26 MED ORDER — LORAZEPAM 1 MG PO TABS: 1.0000 mg | ORAL_TABLET | ORAL | Status: DC | PRN

## 2021-06-26 MED ORDER — BENZTROPINE MESYLATE 1 MG/ML IJ SOLN: 0.5000 mg | Freq: Two times a day (BID) | INTRAMUSCULAR | Status: DC | PRN

## 2021-06-26 MED ORDER — STERILE WATER FOR INJECTION IJ SOLN
INTRAMUSCULAR | Status: AC
  Filled 2021-06-26: qty 10

## 2021-06-26 MED ORDER — DIPHENHYDRAMINE HCL 25 MG PO CAPS
50.0000 mg | ORAL_CAPSULE | Freq: Four times a day (QID) | ORAL | Status: DC | PRN
  Filled 2021-06-26: qty 2

## 2021-06-26 MED ORDER — OLANZAPINE 5 MG PO TBDP: 5.0000 mg | ORAL_TABLET | Freq: Three times a day (TID) | ORAL | Status: DC | PRN

## 2021-06-26 MED ORDER — HALOPERIDOL 5 MG PO TABS
5.0000 mg | ORAL_TABLET | Freq: Three times a day (TID) | ORAL | Status: DC | PRN
  Administered 2021-06-27: 12:00:00 5 mg via ORAL
  Filled 2021-06-26: qty 1

## 2021-06-26 NOTE — BH Assessment (Signed)
[facility] Assessment Progress Note   Per Leandro Reasoner, NP, this pt requires psychiatric hospitalization.  Pt had been accepted to [facility] early this morning, but could not be transported due to staffing problems at the [facility].  This has therefore sought placement at [facility].  Danika, RN, [facility] has assigned pt to [facility] Rm 506-1 to the service of Dr Caswell Corwin.  [facility] will be ready to receive pt at 16:00.  Pt presents under IVC initiated by a BHRT clinician, and upheld by EDP Milton Ferguson, MD, and IVC documents have been faxed to [facility].  Dr Roderic Palau and pt's nurse, Joellen Jersey, have been notified, and Joellen Jersey agrees to call report to [phone].  Pt is to be transported via [employer].  At 15:04 I called [facility] and notified them of the change of plans.  At 15:09 I called her guardian, [name], and notified her.   Jalene Mullet, [place] Coordinator [phone]

## 2021-06-26 NOTE — Progress Notes (Signed)
Inpatient Behavioral Health Placement  Pt meets inpatient criteria per Leandro Reasoner, NP. There are not appropriate beds at [facility] per [facility] Wynonia Hazard, RN. Referral was sent to the following facilities;   Destination Service Provider Address Phone Fax  [place] [place] [address] [phone]  [facility]  [address] [place] [address] [phone] [place], [place] [address] [phone]  [facility]  [place], [place] [address] [place]  [facility] [place] [address] [place]  [facility]  [address] [place] [address] [phone]  [facility]  [place], [place] [address] [phone] [facility]  [address] [place] [address] [phone] [facility]  [place] [address] [place] [address] [phone] [facility]  [address], [place] [address] [phone]  [facility]  [address], [place] [address] [phone] [place]  [address], [place] [address] [phone] [facility]  [address], [place] [place] [address] [phone] [place]  [address] [place] [address] [phone] [facility]  [address], [place] [address] [phone] [facility]  [address], [place] [address] [phone]  [facility]  [address] [place], [place] [address]  [place]  [facility]  [address] [place] [address] [phone]  [facility]  [address], [place] [address] [phone]  [facility]  [place] [address] [place] [address] [phone]   Situation ongoing,  CSW will follow up.   Benjaman Kindler, MSW, LCSWA 06/26/2021  @ 12:11 AM

## 2021-06-26 NOTE — ED Notes (Signed)
Patient has been cooperative so far. Patient given sandwich, crackers and a drink.

## 2021-06-26 NOTE — ED Notes (Signed)
Pt in doorway of room stating, "I'm not under legalism Desiree Daise or whoever." Pt chanting and stating that she shouldn't have been brought here. Pt appears to be getting dressed to leave.

## 2021-06-26 NOTE — Hospital Course (Addendum)
Spoke with [name] who gave verbal permission for scheduled Haldol.    She endorses the patient can be intrusive and verbally aggressive.   She endorses that patient's jail's times have been due to trespassing and resisting arrest. She reports that patient has been deemed unable to stand trial.   She reports that they were placing patient in a hotel in an effort to decrease patient's charges as she appeared to be trespassing due to homelessness.  PGY-2 Damita Dunnings, MD   1/4   Kaitlyn Duncan is ok, with the injection if their is an ACT team in place.

## 2021-06-26 NOTE — ED Notes (Signed)
Pt woke up at 9:15 AM and was calm and cooperative to 12:00 PM. She asked multiple times when she was going to leave. At 12:00 pm, pt began pacing around her room. At 12:30 pt began stating that she needed to leave. Pt hyper-verbal, grandiose, and hyper-focused on religious ideation. Multiple staff members attempted to de-escalate pt and encourage her to stay especially due to the weather. See note at 12:40 pm today. Pt agitation increased. Pt spoke and chanted loudly in the doorway of her room. Two other patients came out of their rooms to observe her behavior. I encourage pt to take oral medications, but she refused. Pt spoke and chanted louder. Pt attempted to leave the doorway of her room multiple times. Pt received Geodon 20 mg IM at 1:15 PM. After receiving IM medication, pt stood in her room and watched TV then pt rested in bed for 1 hour. Pt calm and cooperative from 2:00 PM to the present.

## 2021-06-26 NOTE — ED Notes (Signed)
Pt came to the door of her room and said, People can lie to you and deceive you. I need to get out of here and be alone by myself. You need to release me on my own, by myself, taxi, Melburn Popper, bus pass to the hotel out of the area esa. Security and I attempted to de-escalate pt for 10 minutes. Pt was not reasonable. Pt agitation escalated. Pt said, Any of yall need to be submitting to me. I am gods child. And he works in through me. He speaks the truth and love to people. Unless yall are going to address that abdominal ultrasound then I need to leave. I'm not under the law. Kingsley Spittle she's done illegal activity over me, to me numerous times this year including some of this crassness and harassesness. This is illegal for me. I was just released from the hospital the other day and they have not addressed this abdominal issues. They should have done that ultrasound on my abdomen. I'm not suppose to be here. Perhaps LW needs to be over here. I'm not under a guardian, that's an offense to god jesus holy spirit. They put me in that demonic room. I need you to address me as Ms. Kaitlyn Duncan. I have a Recruitment consultant. Somebody needs to step up and get me a bus pass or a few dollars to get me elsewhere.   Again, multiple staff members attempted to redirect and de-escalate pt. I encourage pt to take medication. Pt said, I'm not taking medication. It's against my will and gods will. You need to listen to me. He speaks to yall through me. No, that is not my room. You come against me, and I will send you to hell, Coren Sagan. You are an unrighteous person, Scientist, research (physical sciences). Earlie Server are mental patients. I am not. I see things through gods eyes. Do I need to send you to hell. Pt began chanting non-stop.

## 2021-06-26 NOTE — ED Notes (Signed)
Sheppard Plumber from [facility] called and said he is the only officer on call due to the holiday. He is unable to transport the pt today. Call back tomorrow at 7:30 AM and leave a message for transportation.

## 2021-06-26 NOTE — BH Assessment (Addendum)
[facility] Assessment Progress Note   Pt has been accepted to [facility].  For details please see note authored by Burnis Medin, RN on 06/26/2021 at 12:32.  EDP Milton Ferguson, MD and pt's nurse, Joellen Jersey, have been notified, and Joellen Jersey agrees to call report to [phone].  Pt is under IVC and is to be [facility] via [facility].  This Probation officer has faxed IVC documents and guardianship documents to [facility].  At 08:44 this Probation officer called pt's [place] guardian, [name], to notify her of disposition.  I have provided her with the phone number for [facility] noted above.  [place] Coordinator [phone]  Addendum:  Per pt's nurse, Katie, [facility] says he will not be able to transport pt until tomorrow, Saturday 06/27/2021.  At 12:02 I called [facility] and spoke to [place].  She reports that they will hold bed for this pt until tomorrow.  She reports that she has not received IVC and guardianship documents.  I will re-transmit them.  Jalene Mullet, [place] [facility] Coordinator [phone]   Addendum:  At 12:32 I called [facility] after re-faxing documents noted above.  Glenna Durand confirms receipt.  At 12:34 I called pt's guardian, Vincente Liberty, updating her on change of plan.  Jalene Mullet, [facility] Coordinator [phone]

## 2021-06-26 NOTE — ED Notes (Signed)
Patient accepted to [facility]. Tollie Eth is the accepting physician. She is able to go after 10am tomorrow. Phone number for report is [phone].

## 2021-06-27 ENCOUNTER — Encounter (HOSPITAL_COMMUNITY): Payer: Self-pay | Admitting: Psychiatry

## 2021-06-27 ENCOUNTER — Other Ambulatory Visit: Payer: Self-pay

## 2021-06-27 DIAGNOSIS — F29 Unspecified psychosis not due to a substance or known physiological condition: Secondary | ICD-10-CM | POA: Diagnosis not present

## 2021-06-27 DIAGNOSIS — D72819 Decreased white blood cell count, unspecified: Secondary | ICD-10-CM | POA: Diagnosis present

## 2021-06-27 DIAGNOSIS — D696 Thrombocytopenia, unspecified: Secondary | ICD-10-CM | POA: Diagnosis present

## 2021-06-27 MED ORDER — BENZTROPINE MESYLATE 0.5 MG PO TABS: 0.5000 mg | ORAL_TABLET | Freq: Two times a day (BID) | ORAL | Status: DC | PRN

## 2021-06-27 MED ORDER — ACETAMINOPHEN 325 MG PO TABS
650.0000 mg | ORAL_TABLET | Freq: Four times a day (QID) | ORAL | Status: DC | PRN
  Administered 2021-06-28 – 2021-07-12 (×7): 650 mg via ORAL
  Filled 2021-06-27 (×7): qty 2

## 2021-06-27 NOTE — Tx Team (Signed)
Initial Treatment Plan 06/27/2021 12:00 AM Kaitlyn Duncan LUN:[id number]    PATIENT STRESSORS: Marital or family conflict   Medication change or noncompliance     PATIENT STRENGTHS: General fund of knowledge  Motivation for treatment/growth    PATIENT IDENTIFIED PROBLEMS: psychosis  "Getting out of this demonic stuff"                   DISCHARGE CRITERIA:  Improved stabilization in mood, thinking, and/or behavior Verbal commitment to aftercare and medication compliance  PRELIMINARY DISCHARGE PLAN: Attend aftercare/continuing care group Attend PHP/IOP Outpatient therapy  PATIENT/FAMILY INVOLVEMENT: This treatment plan has been presented to and reviewed with the patient, Kaitlyn Duncan.  The patient and family have been given the opportunity to ask questions and make suggestions.  Providence Crosby, RN 06/27/2021, 12:00 AM

## 2021-06-27 NOTE — Progress Notes (Signed)
Patient ID: Kaitlyn Duncan, female   DOB: 1971-04-29, 50 y.o.   MRN: [id number]   Admission Note:  50 yr female who presents IVC in no acute distress for the treatment of Psychosis and bizarre behavior. Pt initially un-cooperative and hyper-religious. Pt was calm and cooperative with admission process after de-escalation by numerous staff members. Pt continued to stated she was being held against her will. Pt was educated on her IVC status. Pt continued to state" I do not have a guardian" pt was not forthcoming with information during the admission. "I don't follow your law I'm under [name]'s law"   Per Assessment:  Pt social work/legal guardian Kem Kays [phone] called [facility] ([place] [facility]) and provided information to [facility] for involuntary commitment. Affidavit and petition states:   "PETITIONER STATES: THE RESPONDENT HAS BARRICADED HERSELF INTO THE BATHROOM OF THE EXTENDED STAY HOTEL AND REFUSES TO COME OUT. THE RESPONDENT IS HALLUCINATING, WHEN OFFICERS TRIED TO SPEAK WITH HER SHE CALLED THEM THE DEVIL. THE RESPONDENT IS RESPONDING TO INTERNAL STIMULI. THE RESPONDENT CONTINUOUSLY CHANTS AND SPEAKING IN AN NONSENSICAL MANNER. THE RESPONDENT APPEARS TO NOT BE IN TOUCH WITH REALITY. THE RESPONDENT HAS BEEN COMMITTED PREVIOUSLY."   Pt says she was forcibly brought to [facility] "by illegal aliens." She says she was in the bathroom of a hotel experiencing "GI issues" and that several law enforcement officers broke into the bathroom and pulled her off the toilet, which Pt describes as "traumatic." Pt's medical record indicates Pt has been in the ED several times recently with psychotic symptoms and medical complaints. Pt says she does not believe her medical issues, which she states includes edema in her feet, are being addressed. She says in the hotel room where she is staying has demons dropping down into the room. She says she requested a different room but was denied. She states  recently she has experienced several incidents with demonic forces that have traumatized her. She says she has been arrested recently and the police are part of a demonic cult. 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 She denies current homicidal ideation or history of violence. She denies experiencing auditory or visual hallucinations but states she has visions. She denies alcohol or other substance use.   Pt says she does not have a permanent place to stay. She says she often stays in hotels arranged by her guardian. She cannot identify any family or friends who are supportive. She says she does not believe she has any legal problems but is not certain.    Pt is covered by a blanket and appears somewhat disheveled. She is alert and oriented x4. Pt speaks in a clear tone, at loud volume and normal pace. Motor behavior appears normal. Eye contact is good. Pt's mood is angry and disgruntled, affect is congruent with mood. Thought process is coherent with delusional content and religious preoccupation. Pt's insight is poor and judgment is impaired. She was initially guarded but quickly became pleasant and cooperative during assessment.  A: Skin was assessedKennyth Lose RN) and found to be clear of any abnormal marks apart from marks on her face. PT searched and no contraband found, POC and unit policies explained and understanding verbalized.  Consents obtained. Food and fluids offered, and  accepted.    R:Pt had no additional questions or concerns.

## 2021-06-27 NOTE — [facility] Counselor (Signed)
Clinical Social Work Note  CSW attempted to reach patient's Legal Guardian from [place], Kem Kays (cell) [phone], (work)  [phone].  Voicemail could not be left on cell number but message was left on work line asking for a call back on Monday 12/26 to do Psychosocial Assessment with social work team.  Selmer Dominion, [place] 06/27/2021, 2:00 PM

## 2021-06-27 NOTE — Progress Notes (Signed)
Adult Psychoeducational Group Note  Date:  06/27/2021 Time:  11:27 PM  Group Topic/Focus:  Wrap-Up Group:   The focus of this group is to help patients review their daily goal of treatment and discuss progress on daily workbooks.  Participation Level:  Did Not Attend  Participation Quality:   Did Not Attend  Affect:   Did Not Attend  Cognitive:   Did Not Attend  Insight: None  Engagement in Group:   Did Not Attend  Modes of Intervention:   Did Not Attend  Additional Comments:  Pt did not attend evening wrap up group tonight.  [name] 06/27/2021, 11:27 PM

## 2021-06-27 NOTE — Group Note (Signed)
[facility] LCSW Group Therapy Note  06/27/2021  10:00-11:00AM  Type of Therapy and Topic:  Group Therapy:  Acknowledging and Resolving Issues about the Holidays  Participation Level:  Did Not Attend   Description of Group:   Patients in this group were asked to briefly describe their experiences with Christmas/winter holidays in their lives, both in childhood and adulthood.  Different types of support provided by the influencing individuals in their lives were identified.   Patients were then encouraged to determine whether their influencing figures were/are healthy or unhealthy for them.  The manner in which those early experiences have shaped patient's feelings and actions on an ongoing basis was pointed out and acknowledged.  Group members gave support to each other.  CSW led a discussion on how helpful it can be to resolve past issues, and how this can be done whether the influencing figures are now alive or already deceased.  An emphasis was placed on continuing to work with a therapist on these issues  when patients leave the hospital in order to be able to focus on the future instead of the past, to continue becoming healthier and happier.   Therapeutic Goals: 1)  discuss the possibility of influencing figure(s) being positive and/or negative in one's life, normalizing that some people never had positive experiences with "support" persons  2)  describe patient's specific example of their typical holiday experiences, allowing time to vent  3)  identify the patient's current need to decide how they now want to experience the holidays, by choice instead of only following the traditions of the past  4)  elicit commitments to work on resolving ambivalent feelings about the holidays in order to move forward in life and wellness   Summary of Patient Progress:  The patient did not attend group.   Therapeutic Modalities:   Processing Brief Solution-Focused Therapy  Maretta Los

## 2021-06-27 NOTE — H&P (Addendum)
Psychiatric Admission Assessment Adult  Patient Identification: Kaitlyn Duncan MRN:  [id number] Date of Evaluation:  06/27/2021 Chief Complaint:  Acute psychosis ([place]) [F23] Principal Diagnosis: Schizophrenia spectrum disorder with psychotic disorder type not yet determined ([place]) Diagnosis:  Principal Problem:   Schizophrenia spectrum disorder with psychotic disorder type not yet determined ([place]) Active Problems:   Brain metastasis ([place])   Melanoma of skin ([place])   Leukopenia   Thrombocytopenia ([place])  History of Present Illness: Kaitlyn Duncan is a 50 year old female with a reported psychiatric history of bipolar disorder and unspecified psychosis, and a medical history of melanoma of skin with metastases to the brain and adrenals, admitted under IVC from [facility] for bizarre behaviors, including barricading herself in the bathroom at her extended stay hotel and hyperreligiosity.  On chart review, patient has had multiple ED visits for paranoia, malingering, and psychosis.  She had total brain irradiation around 2019, and patient appears to be delusional at baseline.Per attending attestation: "History was obtained from review of her medical record.  It appears she has a history of melanoma of unknown primary diagnosed in July 2019 and was found to have stage IV disease including CNS, peritoneal and subcutaneous nodules.  She appears to have had whole brain radiation in August 2019 and had [place] and [name] treatments discontinued in August 2019 due to partial response and 12 cycles of Pembrolizumab starting in December 2019 and completed in July 2020.  Per her January 2022 office visit with oncology, at that time she was felt to have achieved a near complete response to a combination of oral targeted therapy and immunotherapy and at that time was on active surveillance without evidence of relapse of the disease.  Her records indicate that by April 2022 she was in the emergency department for evaluation  of psychosis.  Her records at that time indicate that she had a previous history of bipolar and psychosis predating her metastatic melanoma.  It appears she has had multiple ED visits for psychosis since April 2022 and did have an MRI of the brain last done in April of 2022 which showed stable findings that were compatible with treated lesions and no new areas of enhancement or hemorrhage to suggest active disease or disease progression.  In reviewing her previous medication trials it appears that the patient has been on Haldol, Zyprexa, Cogentin, Tegretol, Seroquel, IM Geodon, and Ativan in the past. "  On assessment, patient quickly becomes irritable and agitated, stating that "demonic people, 3 brutes" dropped into her hotel room.  She reports that the police, as well as medical staff are treating her in a way that does not acknowledge that she is a "child of [name], daughter of the [employer], with spiritual gifts and discernment."  She is a poor historian, and yells at our team for mentioning her history of melanoma, "you have no right to access my information; that is a HIPAA violation."  She told attending, Dr. Nelda Marseille, "you are making me so mad that I could just hit you right now."  She was unable to be calmed, and ultimately required po PRN medication for acute agitation and could not continue with the interview.  Oncology Lindi Adie): Verify with guardian whether she wants to go through treatment. If she wants to go through treatment, she will need a Whole body scan and to follow up in clinic outpatient; if not, consider hospice.  Collateral: Arvella Merles [phone]: No answer, VM left.  Associated Signs/Symptoms: Depression Symptoms: Unable to assess Duration of  Depression Symptoms: Greater than two weeks  (Hypo) Manic Symptoms:  Delusions, Irritable Mood, Anxiety Symptoms: Unable to assess Psychotic Symptoms:  Delusions, Ideas of Reference, Paranoia, PTSD Symptoms: Unable to  assess Total Time spent with patient: I personally spent 60 minutes on the unit in direct patient care. The direct patient care time included face-to-face time with the patient, reviewing the patient's chart, communicating with other professionals, and coordinating care. Greater than 50% of this time was spent in counseling or coordinating care with the patient regarding goals of hospitalization, psycho-education, and discharge planning needs.   Past Psychiatric Hx: Previous Psych Diagnoses: Bipolar disorder, unspecified psychosis Prior inpatient treatment:Patient not cooperative to offer information Current/prior outpatient treatment: unable to assess Prior rehab AL:PFXTKWI not cooperative to offer information Psychotherapy hx: Patient not cooperative to offer information History of suicide:Patient not cooperative to offer information History of homicide: Patient not cooperative to offer information Psychiatric medication history: Per records has been on Geodon IM, Haldol, Seroquel, Tegretol, Cogentin, Zyprexa, Ativan Psychiatric medication compliance history:Patient not cooperative to offer information Neuromodulation history: Patient not cooperative to offer information Current Psychiatrist:Patient not cooperative to offer information Current therapist: Patient not cooperative to offer information  Is the patient at risk to self? Yes.    Has the patient been a risk to self in the past 6 months? Yes.    Has the patient been a risk to self within the distant past? Yes.    Is the patient a risk to others?  Possibly Has the patient been a risk to others in the past 6 months?  Patient not cooperative to offer information Has the patient been a risk to others within the distant past? Patient not cooperative to offer information   Alcohol Screening:  1. How often do you have a drink containing alcohol?: Never 2. How many drinks containing alcohol do you have on a typical day when you are  drinking?: 1 or 2 3. How often do you have six or more drinks on one occasion?: Never AUDIT-C Score: 0 4. How often during the last year have you found that you were not able to stop drinking once you had started?: Never 5. How often during the last year have you failed to do what was normally expected from you because of drinking?: Never 6. How often during the last year have you needed a first drink in the morning to get yourself going after a heavy drinking session?: Never 7. How often during the last year have you had a feeling of guilt of remorse after drinking?: Never 8. How often during the last year have you been unable to remember what happened the night before because you had been drinking?: Never 9. Have you or someone else been injured as a result of your drinking?: No 10. Has a relative or friend or a doctor or another health worker been concerned about your drinking or suggested you cut down?: No Alcohol Use Disorder Identification Test Final Score (AUDIT): 0  Substance Abuse History in the last 12 months:  Patient not cooperative to offer information Consequences of Substance Abuse: NA Previous Psychotropic Medications: Yes  Psychological Evaluations: Yes   Past Medical History:  Past Medical History:  Diagnosis Date   Allergy    Anemia    Anemia    Diabetes mellitus without complication ([place])    prediabetic   GERD (gastroesophageal reflux disease)    melanoma with met dz dx'd 01/2018   brain and adrenal   Seasonal allergies  Prior Hosp:Patient not cooperative to offer information Prior Surgeries/Trauma: See below Head trauma, LOC, concussions, seizures: Patient not cooperative to offer information Allergies: NKDA LMP: Perimenopausal PCP: None reported  Past Surgical History:  Procedure Laterality Date   BIOPSY  01/28/2018   Procedure: BIOPSY;  Surgeon: Carman Ching, MD;  Location: [facility] END[facility]COPY;  Service: Endoscopy;;   BREAST BIOPSY Left     ESOPHAGOGASTRODUODENOSCOPY N/A 01/28/2018   Procedure: ESOPHAGOGASTRODUODENOSCOPY (EGD);  Surgeon: Carman Ching, MD;  Location: [facility];  Service: Endoscopy;  Laterality: N/A;   MOUTH SURGERY     OVARY SURGERY     removed "something"   Family History per EHR: Family History  Problem Relation Age of Onset   Melanoma Mother    Hypertension Father    Breast cancer Maternal Aunt    Breast cancer Paternal Aunt    Diabetes Paternal Aunt    Breast cancer Maternal Grandmother    Breast cancer Paternal Grandmother    Diabetes Paternal Grandmother    Colon cancer Neg Hx    Esophageal cancer Neg Hx    Pancreatic cancer Neg Hx    Stomach cancer Neg Hx    Liver disease Neg Hx    Rectal cancer Neg Hx    Family history per patient: Medical: Patient not cooperative to offer information Psych: Patient not cooperative to offer information Psych EM:SGESYOZ not cooperative to offer information SA/HA:Patient not cooperative to offer information Substance use family [id number] not cooperative to offer information  Social History:  Social History   Substance and Sexual Activity  Alcohol Use No     Social History   Substance and Sexual Activity  Drug Use No    Additional Social History:  Substance Abuse Hx: Patient not cooperative to offer information  Social History: Patient not cooperative to offer information     Allergies:  No Known Allergies Lab Results:  Results for orders placed or performed during the hospital encounter of 06/25/21 (from the past 48 hour(s))  Resp Panel by RT-PCR (Flu A&B, Covid) Nasopharyngeal Swab     Status: None   Collection Time: 06/25/21  7:50 PM   Specimen: Nasopharyngeal Swab; Nasopharyngeal(NP) swabs in vial transport medium  Result Value Ref Range   SARS Coronavirus 2 by RT PCR NEGATIVE NEGATIVE    Comment: (NOTE) SARS-CoV-2 target nucleic acids are NOT DETECTED.  The SARS-CoV-2 RNA is generally detectable in upper respiratory specimens  during the acute phase of infection. The lowest concentration of SARS-CoV-2 viral copies this assay can detect is 138 copies/mL. A negative result does not preclude SARS-Cov-2 infection and should not be used as the sole basis for treatment or other patient management decisions. A negative result may occur with  improper specimen collection/handling, submission of specimen other than nasopharyngeal swab, presence of viral mutation(s) within the areas targeted by this assay, and inadequate number of viral copies(<138 copies/mL). A negative result must be combined with clinical observations, patient history, and epidemiological information. The expected result is Negative.  Fact Sheet for Patients:  BloggerCourse.com  Fact Sheet for Healthcare Providers:  SeriousBroker.it  This test is no t yet approved or cleared by the [place] FDA and  has been authorized for detection and/or diagnosis of SARS-CoV-2 by FDA under an Emergency Use Authorization (EUA). This EUA will remain  in effect (meaning this test can be used) for the duration of the COVID-19 declaration under Section 564(b)(1) of the Act, 21 U.S.C.section 360bbb-3(b)(1), unless the authorization is terminated  or revoked sooner.  Influenza A by PCR NEGATIVE NEGATIVE   Influenza B by PCR NEGATIVE NEGATIVE    Comment: (NOTE) The Xpert Xpress SARS-CoV-2/FLU/RSV plus assay is intended as an aid in the diagnosis of influenza from Nasopharyngeal swab specimens and should not be used as a sole basis for treatment. Nasal washings and aspirates are unacceptable for Xpert Xpress SARS-CoV-2/FLU/RSV testing.  Fact Sheet for Patients: EntrepreneurPulse.com.au  Fact Sheet for Healthcare Providers: IncredibleEmployment.be  This test is not yet approved or cleared by the [place] FDA and has been authorized for detection and/or diagnosis  of SARS-CoV-2 by FDA under an Emergency Use Authorization (EUA). This EUA will remain in effect (meaning this test can be used) for the duration of the COVID-19 declaration under Section 564(b)(1) of the Act, 21 U.S.C. section 360bbb-3(b)(1), unless the authorization is terminated or revoked.  Performed at [facility], [place] [address] [place], [place] [address]   Comprehensive metabolic panel     Status: Abnormal   Collection Time: 06/25/21  7:50 PM  Result Value Ref Range   Sodium 136 135 - 145 mmol/L   Potassium 3.8 3.5 - 5.1 mmol/L   Chloride 108 98 - 111 mmol/L   CO2 25 22 - 32 mmol/L   Glucose, Bld 89 70 - 99 mg/dL    Comment: Glucose reference range applies only to samples taken after fasting for at least 8 hours.   BUN 21 (H) 6 - 20 mg/dL   Creatinine, Ser 0.50 0.44 - 1.00 mg/dL   Calcium 8.4 (L) 8.9 - 10.3 mg/dL   Total Protein 6.5 6.5 - 8.1 g/dL   Albumin 3.7 3.5 - 5.0 g/dL   AST 31 15 - 41 U/L   ALT 23 0 - 44 U/L   Alkaline Phosphatase 68 38 - 126 U/L   Total Bilirubin 0.5 0.3 - 1.2 mg/dL   GFR, Estimated >60 >60 mL/min    Comment: (NOTE) Calculated using the CKD-EPI Creatinine Equation (2021)    Anion gap 3 (L) 5 - 15    Comment: Performed at [facility], [place] [address], [place], [place] [address]  Ethanol     Status: None   Collection Time: 06/25/21  7:50 PM  Result Value Ref Range   Alcohol, Ethyl (B) <10 <10 mg/dL    Comment: (NOTE) Lowest detectable limit for serum alcohol is 10 mg/dL.  For medical purposes only. Performed at [facility], [place] [address], [place], [place] [address]   CBC with Diff     Status: Abnormal   Collection Time: 06/25/21  7:50 PM  Result Value Ref Range   WBC 3.9 (L) 4.0 - 10.5 K/uL   RBC 4.21 3.87 - 5.11 MIL/uL   Hemoglobin 11.7 (L) 12.0 - 15.0 g/dL   HCT 37.1 36.0 - 46.0 %   MCV 88.1 80.0 - 100.0 fL   MCH 27.8 26.0 - 34.0 pg   MCHC 31.5 30.0 - 36.0 g/dL   RDW  13.5 11.5 - 15.5 %   Platelets 60 (L) 150 - 400 K/uL    Comment: SPECIMEN CHECKED FOR CLOTS Immature Platelet Fraction may be clinically indicated, consider ordering this additional test [id number] CONSISTENT WITH PREVIOUS RESULT REPEATED TO VERIFY    nRBC 0.0 0.0 - 0.2 %   Neutrophils Relative % 68 %   Neutro Abs 2.7 1.7 - 7.7 K/uL   Lymphocytes Relative 21 %   Lymphs Abs 0.8 0.7 - 4.0 K/uL   Monocytes Relative 10 %  Monocytes Absolute 0.4 0.1 - 1.0 K/uL   Eosinophils Relative 0 %   Eosinophils Absolute 0.0 0.0 - 0.5 K/uL   Basophils Relative 0 %   Basophils Absolute 0.0 0.0 - 0.1 K/uL   Immature Granulocytes 1 %   Abs Immature Granulocytes 0.02 0.00 - 0.07 K/uL    Comment: Performed at [facility] [place], [place] [address], [place], [place] [address]  I-Stat beta hCG blood, ED     Status: None   Collection Time: 06/25/21  8:06 PM  Result Value Ref Range   I-stat hCG, quantitative <5.0 <5 mIU/mL   Comment 3            Comment:   GEST. AGE      CONC.  (mIU/mL)   <=1 WEEK        5 - 50     2 WEEKS       50 - 500     3 WEEKS       100 - 10,000     4 WEEKS     1,000 - 30,000        FEMALE AND NON-PREGNANT FEMALE:     LESS THAN 5 mIU/mL   Urine rapid drug screen (hosp performed)     Status: None   Collection Time: 06/26/21  2:00 AM  Result Value Ref Range   Opiates NONE DETECTED NONE DETECTED   Cocaine NONE DETECTED NONE DETECTED   Benzodiazepines NONE DETECTED NONE DETECTED   Amphetamines NONE DETECTED NONE DETECTED   Tetrahydrocannabinol NONE DETECTED NONE DETECTED   Barbiturates NONE DETECTED NONE DETECTED    Comment: (NOTE) DRUG SCREEN FOR MEDICAL PURPOSES ONLY.  IF CONFIRMATION IS NEEDED FOR ANY PURPOSE, NOTIFY LAB WITHIN 5 DAYS.  LOWEST DETECTABLE LIMITS FOR URINE DRUG SCREEN Drug Class                     Cutoff (ng/mL) Amphetamine and metabolites    1000 Barbiturate and metabolites    200 Benzodiazepine                 282 Tricyclics and  metabolites     300 Opiates and metabolites        300 Cocaine and metabolites        300 THC                            50 Performed at [facility], [place] [address] [place], [place] [address]   Urinalysis, Routine w reflex microscopic     Status: Abnormal   Collection Time: 06/26/21  2:00 AM  Result Value Ref Range   Color, Urine YELLOW YELLOW   APPearance CLEAR CLEAR   Specific Gravity, Urine 1.015 1.005 - 1.030   pH 7.5 5.0 - 8.0   Glucose, UA NEGATIVE NEGATIVE mg/dL   Hgb urine dipstick NEGATIVE NEGATIVE   Bilirubin Urine NEGATIVE NEGATIVE   Ketones, ur NEGATIVE NEGATIVE mg/dL   Protein, ur NEGATIVE NEGATIVE mg/dL   Nitrite NEGATIVE NEGATIVE   Leukocytes,Ua NEGATIVE NEGATIVE   RBC / HPF 0-5 0 - 5 RBC/hpf   WBC, UA 0-5 0 - 5 WBC/hpf   Bacteria, UA RARE (A) NONE SEEN   Squamous Epithelial / LPF 0-5 0 - 5    Comment: Performed at [facility], [place] [address], [place], [place] [address]    Blood Alcohol level:  Lab Results  Component Value  Date   ETH <10 06/25/2021   ETH <10 26/37/8588    Metabolic Disorder Labs:  Lab Results  Component Value Date   HGBA1C 5.8 (H) 02/03/2018   MPG 119.76 02/03/2018   No results found for: PROLACTIN Lab Results  Component Value Date   CHOL 176 02/03/2018   TRIG 203 (H) 02/03/2018   HDL 44 02/03/2018   CHOLHDL 4.0 02/03/2018   VLDL 41 (H) 02/03/2018   LDLCALC 91 02/03/2018   LDLCALC 100 (H) 11/05/2009    Current Medications: Current Facility-Administered Medications  Medication Dose Route Frequency Provider Last Rate Last Admin   acetaminophen (TYLENOL) tablet 650 mg  650 mg Oral Q6H PRN Rosezetta Schlatter, MD       benztropine (COGENTIN) tablet 0.5 mg  0.5 mg Oral BID PRN Rosezetta Schlatter, MD       haloperidol lactate (HALDOL) injection 5 mg  5 mg Intramuscular Q6H PRN Leevy-Johnson, Brooke A, NP       And   benztropine mesylate (COGENTIN) injection 0.5 mg  0.5 mg Intramuscular BID PRN  Leevy-Johnson, Brooke A, NP       diphenhydrAMINE (BENADRYL) capsule 50 mg  50 mg Oral Q6H PRN Damita Dunnings B, MD       Or   diphenhydrAMINE (BENADRYL) injection 50 mg  50 mg Intramuscular Q6H PRN Damita Dunnings B, MD       haloperidol (HALDOL) tablet 5 mg  5 mg Oral Q8H PRN Damita Dunnings B, MD   5 mg at 06/27/21 1149   Or   haloperidol lactate (HALDOL) injection 10 mg  10 mg Intramuscular Q8H PRN Damita Dunnings B, MD       LORazepam (ATIVAN) tablet 2 mg  2 mg Oral Q6H PRN Damita Dunnings B, MD   2 mg at 06/27/21 1149   Or   LORazepam (ATIVAN) injection 2 mg  2 mg Intramuscular Q6H PRN Freida Busman, MD       PTA Medications: Medications Prior to Admission  Medication Sig Dispense Refill Last Dose   clotrimazole (LOTRIMIN) 1 % cream Apply to affected area 2 times daily (Patient not taking: Reported on 06/25/2021) 15 g 0    fluticasone (FLONASE) 50 MCG/ACT nasal spray Place 2 sprays into both nostrils daily. (Patient not taking: Reported on 06/25/2021) 16 g 2    hydrocortisone cream 1 % Apply to affected area 2 times daily (Patient not taking: Reported on 06/25/2021) 15 g 0     Musculoskeletal: Strength & Muscle Tone: within normal limits Gait & Station: normal, steady Patient leans: N/A    Psychiatric Specialty Exam:  Presentation  General Appearance: Casual; Appropriate for Environment; Fairly Groomed   Eye Personnel officer and coherent but rambling   Speech Volume:Increased   Handedness:Right   Mood and Affect  Mood:Irritable   Affect:Congruent (Irritable, with statements that she should not be here)    Thought Process  Thought Processes:Disorganized   Duration of Psychotic Symptoms: Greater than six months   Orientation:-- (Patient not cooperative to assess)   Thought Content:rumination, illogical, hyper-religious statements, delusions that she was "illegally seized" and does not have a Guardian - would not cooperate to answer about SI, HI,  AVH, ideas of reference, or first rank symptoms; appears paranoid on exam   Hallucinations:uncooperative for questioning   Suicidal Thoughts:uncooperative for questioning   Homicidal Thoughts: uncooperative for questioning    Sensorium  Memory:Immediate Poor; Recent Poor; Remote Poor   Judgment:Impaired   Insight:None    Executive  Functions  Concentration:Poor   Attention Span:Poor   Recall:Poor   Fund of Knowledge:Poor   Language:Poor    Psychomotor Activity  Psychomotor Activity:Psychomotor Activity: Normal   Assets  Assets:Financial Resources/Insurance; Housing    Sleep  Sleep:Number of Hours of Sleep: 5.25   Physical Exam Vitals and nursing note reviewed.  Constitutional:      General: She is not in acute distress.    Appearance: She is normal weight.  HENT:     Head: Normocephalic and atraumatic.     Mouth/Throat:     Comments: With open sores on upper lip Pulmonary:     Effort: Pulmonary effort is normal.  Neurological:     Mental Status: She is alert.     Gait: Gait normal.  Otherwise uncooperative for physical exam  Review of Systems  Unable to perform ROS: Psychiatric disorder - uncooperative for questioning  Blood pressure 133/84, pulse 79, temperature 98.2 F (36.8 C), temperature source Oral, resp. rate 18, height $RemoveBe'5\' 1"'ZsGcAzGsQ$  (1.549 m), weight 56 kg, SpO2 100 %. Body mass index is 23.32 kg/m.   ASSESSMENT: Principal Problem:   Schizophrenia spectrum disorder with psychotic disorder type not yet determined ([place]) Active Problems:   Brain metastasis (HCC)   Melanoma of skin (HCC)   Leukopenia   Thrombocytopenia (HCC)    The patient is a 50 year old female with a reported history of bipolar disorder and unspecified psychosis, and a medical history of melanoma with metastases to the brain, admitted under IVC for bizarre behaviors, agitation, delusions, and hyperreligiosity.  On assessment, patient remains irritable and  hyperreligious, making assessment difficult to obtain.   [place] day 1.   Treatment Plan Summary: Daily contact with patient to assess and evaluate symptoms and progress in treatment and Medication management  Observation Level/Precautions:  15 minute checks  Laboratory: As below  Psychotherapy: Group and supportive psychotherapy  Medications: As below  Consultations: Oncology  Discharge Concerns: Safety planning  Estimated LOS: 5 to 7 days  Other: N/A   Safety and Monitoring: INVOLUTARILY (by [employer]) admission to inpatient psychiatric unit for safety, stabilization and treatment Daily contact with patient to assess and evaluate symptoms and progress in treatment Patient's case to be discussed in multi-disciplinary team meeting Observation Level : q15 minute checks Vital signs: q12 hours Precautions: suicide, elopement, and assault  2. Psychiatric Problems #Unspecified schizophrenia spectrum and other psychotic d/o (r/o psychosis secondary to general medical condition - metatstatic melanoma vs bipolar d/o MRE manic with psychotic features) Notes from previous [place] visit report some baseline delusions, hyperreligiosity in 01/2021, with problems ongoing since 10/2020. Patient with legal guardian, not yet able to reach [employer].  VM left. - Once guardian's permission is obtained, will initiate Haldol 5 mg twice daily p.o. or IM, scheduled Ativan 1 mg daily p.o. or IM - Continue as needed agitation protocol of Haldol 5 mg every 8 hours p.o. or IM, with/ or Ativan 2 mg every 6 hours p.o. or IM - Continue Cogentin 0.5 mg twice daily PRN  3. Medical Management Covid negative CMP: Calcium 8.4 CBC: WBC 3.9, H/H 11.7/37.1, platelets 60, ANC 2700 EtOH: <10 UDS: Negative TSH: Pending A1C: Pending Lipids: Pending   #History of metastatic melanoma Metastases to the brain and adrenals first reported in 2019, and patient had full brain irradiation around that time.  Patient was with regular  follow-up until April of this year, and was lost to follow-up for her July appointment and beyond. - Reached out to oncology; see HPI  for Recs  #Thrombocytopenia Platelets 60 Appears chronic (platelets down to 57 5 months ago)- will monitor with serial CBCs  #Leukopenia ANC 2700 and WBC 3.9 - will monitor with serial CBCs  Physician Treatment Plan for Primary Diagnosis: Schizophrenia spectrum disorder with psychotic disorder type not yet determined ([place]) Long Term Goal(s): Improvement in symptoms so as ready for discharge  Short Term Goals: Ability to identify changes in lifestyle to reduce recurrence of condition will improve, Ability to verbalize feelings will improve, Ability to demonstrate self-control will improve, Ability to identify and develop effective coping behaviors will improve, Ability to maintain clinical measurements within normal limits will improve, and Compliance with prescribed medications will improve  Physician Treatment Plan for Secondary Diagnosis: Principal Problem:   Schizophrenia spectrum disorder with psychotic disorder type not yet determined ([place]) Active Problems:   Brain metastasis (HCC)   Melanoma of skin ([place])   Leukopenia   Thrombocytopenia ([place])   Long Term Goal(s): Improvement in symptoms so as ready for discharge  Short Term Goals: Ability to identify changes in lifestyle to reduce recurrence of condition will improve, Ability to identify and develop effective coping behaviors will improve, Ability to maintain clinical measurements within normal limits will improve, and Compliance with prescribed medications will improve  I certify that inpatient services furnished can reasonably be expected to improve the patient's condition.    Rosezetta Schlatter, MD PGY 1 12/24/20227:02 PM

## 2021-06-27 NOTE — Progress Notes (Addendum)
°   06/27/21 1100  Psych Admission Type (Psych Patients Only)  Admission Status Involuntary  Psychosocial Assessment  Patient Complaints Worrying  Eye Contact Fair  Facial Expression Worried  Affect Labile;Preoccupied  Speech Argumentative  Interaction Defensive  Motor Activity Slow  Appearance/Hygiene Disheveled  Behavior Characteristics Cooperative;Agitated  Mood Anxious;Angry  Aggressive Behavior  Effect No apparent injury  Thought Process  Coherency Concrete thinking;Circumstantial  Content Blaming others  Delusions Religious;Persecutory  Perception Hallucinations  Hallucination Auditory;Visual  Judgment Impaired  Confusion WDL  Danger to Self  Current suicidal ideation? Denies  Danger to Others  Danger to Others None reported or observed   D. Pt presented tearful upon initial approach,disorganized, stating she was involved in "illegal  activity, illegal seizures, illegal brain injury". Pt observed standing in the hall next to the nurse's station demanding that we retrieve her belongings, stating that she doesn't belong here and that she's been discharged.Pt was able to be redirected temporarily, but became loud, and verbally aggressive when meeting with Provider. Pt currently denies SI/HI and AVH, but endorses seeing into the spiritual realm  A. Labs and vitals monitored. Pt given prn meds po for agitation per MD's order  Pt supported emotionally and encouraged to express concerns and ask questions.   R. Pt remains safe with 15 minute checks. Will continue POC.

## 2021-06-27 NOTE — Progress Notes (Signed)
°   06/27/21 2000  Psych Admission Type (Psych Patients Only)  Admission Status Involuntary  Psychosocial Assessment  Patient Complaints Worrying;Anxiety  Eye Contact Fair;Brief  Facial Expression Anxious  Affect Preoccupied  Speech Argumentative;Logical/coherent  Interaction Defensive  Motor Activity Slow  Appearance/Hygiene Disheveled  Behavior Characteristics Anxious;Agitated  Mood Anxious  Thought Process  Coherency Concrete thinking  Content Blaming others  Delusions Persecutory;Religious  Perception Hallucinations  Hallucination Auditory;Visual  Judgment Impaired  Confusion None  Danger to Self  Current suicidal ideation? Denies  Danger to Others  Danger to Others None reported or observed
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
## 2021-06-27 NOTE — Progress Notes (Signed)
°   06/27/21 0500  Sleep  Number of Hours 5.25

## 2021-06-28 DIAGNOSIS — F29 Unspecified psychosis not due to a substance or known physiological condition: Secondary | ICD-10-CM | POA: Diagnosis not present

## 2021-06-28 LAB — LIPID PANEL
Cholesterol: 212 mg/dL — ABNORMAL HIGH (ref 0–200)
HDL: 63 mg/dL (ref >40)
LDL Cholesterol: 138 mg/dL — ABNORMAL HIGH (ref 0–99)
Total CHOL/HDL Ratio: 3.4 RATIO
Triglycerides: 56 mg/dL (ref <150)
VLDL: 11 mg/dL (ref 0–40)

## 2021-06-28 LAB — TSH: TSH: 2.774 u[IU]/mL (ref 0.350–4.500)

## 2021-06-28 LAB — HEMOGLOBIN A1C
Hgb A1c MFr Bld: 5.1 % (ref 4.8–5.6)
Mean Plasma Glucose: 99.67 mg/dL

## 2021-06-28 MED ORDER — HALOPERIDOL LACTATE 5 MG/ML IJ SOLN
5.0000 mg | Freq: Two times a day (BID) | INTRAMUSCULAR | Status: DC
  Filled 2021-06-28 (×6): qty 1

## 2021-06-28 MED ORDER — HALOPERIDOL LACTATE 5 MG/ML IJ SOLN: 5.0000 mg | Freq: Three times a day (TID) | INTRAMUSCULAR | Status: DC | PRN

## 2021-06-28 MED ORDER — HALOPERIDOL LACTATE 5 MG/ML IJ SOLN
5.0000 mg | Freq: Two times a day (BID) | INTRAMUSCULAR | Status: DC
  Administered 2021-06-29 – 2021-07-03 (×7): 5 mg via INTRAMUSCULAR
  Filled 2021-06-28 (×16): qty 1

## 2021-06-28 MED ORDER — LORAZEPAM 2 MG/ML IJ SOLN: 1.0000 mg | Freq: Three times a day (TID) | INTRAMUSCULAR | Status: DC | PRN

## 2021-06-28 MED ORDER — HALOPERIDOL 5 MG PO TABS
5.0000 mg | ORAL_TABLET | Freq: Two times a day (BID) | ORAL | Status: DC
  Administered 2021-06-28: 13:00:00 5 mg via ORAL
  Filled 2021-06-28 (×6): qty 1

## 2021-06-28 MED ORDER — HALOPERIDOL 5 MG PO TABS
5.0000 mg | ORAL_TABLET | Freq: Two times a day (BID) | ORAL | Status: DC
  Administered 2021-06-28 – 2021-06-30 (×4): 5 mg via ORAL
  Filled 2021-06-28 (×15): qty 1

## 2021-06-28 MED ORDER — LORAZEPAM 0.5 MG PO TABS
0.5000 mg | ORAL_TABLET | Freq: Two times a day (BID) | ORAL | Status: DC
  Administered 2021-06-28 – 2021-07-08 (×14): 0.5 mg via ORAL
  Filled 2021-06-28 (×16): qty 1

## 2021-06-28 MED ORDER — HALOPERIDOL 5 MG PO TABS
5.0000 mg | ORAL_TABLET | Freq: Three times a day (TID) | ORAL | Status: DC | PRN
  Filled 2021-06-28: qty 1

## 2021-06-28 MED ORDER — LORAZEPAM 2 MG/ML IJ SOLN
0.5000 mg | Freq: Two times a day (BID) | INTRAMUSCULAR | Status: DC
  Administered 2021-07-01 – 2021-07-03 (×6): 0.5 mg via INTRAMUSCULAR
  Filled 2021-06-28 (×9): qty 1

## 2021-06-28 MED ORDER — HALOPERIDOL 5 MG PO TABS: 5.0000 mg | ORAL_TABLET | Freq: Three times a day (TID) | ORAL | Status: DC | PRN

## 2021-06-28 MED ORDER — LORAZEPAM 1 MG PO TABS
1.0000 mg | ORAL_TABLET | Freq: Three times a day (TID) | ORAL | Status: DC | PRN
  Filled 2021-06-28: qty 1

## 2021-06-28 NOTE — BHH Group Notes (Signed)
[facility] LCSW Group Therapy Note  Date/Time:  06/28/2021  10:30AM-11:30AM  Type of Therapy and Topic:  Group Therapy:  Holiday Music  Participation Level:  Minimal   Description of Group: In this process group, members made requests for their favorite holiday songs, which were played for all participants to enjoy.  Patients were encouraged to use music as a coping tool when they go home.  The patients as a whole were sad about being in the hospital for this holiday and were also encouraged to consider adopting a day after discharge as their Christmas Day for this year.  Therapeutic Goals: Patients will choose songs that are meaningful to them and have the opportunity to listen to them Patients will explore the use of music as a coping skill  Summary of Patient Progress:  The patient was only present for a few minutes in group, arriving late and leaving early.  While there, she chose Stanton Kidney, Did You Know for the group to listen to.  The patient was attentive during that song.  Shortly after that, she went to the nurse's station and was overheard by the group calling the nurse and MHT ungodly.  The other patients had to be redirected from talking about patient's need for medicine.    Therapeutic Modalities:  Activity   Selmer Dominion, LCSW

## 2021-06-28 NOTE — Progress Notes (Signed)
Progress note  Pt found in bed. Pt awoke and was argumentative, religiously preoccupied, and discussed how they felt they weren't being treated well as a pt. Pt had c/o of abdominal pain. Pt took oral medications for this writer after forced medication protocol was explained. Pt continued to be verbally abusive toward staff. Pt resting now. Pt denies si/hi/ah/vh and verbally agrees to approach staff if these become apparent or before harming themselves/others while at [facility].  A: Pt provided support and encouragement. Pt given medication per protocol and standing orders. Q50m safety checks implemented and continued.  R: Pt safe on the unit. Will continue to monitor.

## 2021-06-28 NOTE — Plan of Care (Signed)
  Problem: Education: Goal: Knowledge of  General Education information/materials will improve Outcome: Progressing Goal: Emotional status will improve Outcome: Progressing Goal: Mental status will improve Outcome: Progressing   

## 2021-06-28 NOTE — Progress Notes (Addendum)
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  Chart Review of Past 24 hrs: The patient's chart was reviewed and nursing notes were reviewed. The patient's case was discussed in multidisciplinary team meeting.  Per MAR: - Patient will have scheduled meds today. - PRNs: Ativan 2 mg x 1, Haldol 5 mg x 1 Per RN notes, patient is irritable and hyper religious, loudly praying and declaring that she has no need to be here, but she is attending some groups. Patient sleep hours not documented  Patient had the following psychiatric recommendations yesterday:  - Once guardian's permission is obtained, will initiate Haldol 5 mg twice daily p.o. or IM, scheduled Ativan 1 mg daily p.o. or IM - Continue as needed agitation protocol of Haldol 5 mg every 8 hours p.o. or IM, with/ or Ativan 2 mg every 6 hours p.o. or IM - Continue Cogentin 0.5 mg twice daily PRN   On Today's Assessment (06/28/2021): Case was discussed in the multidisciplinary team. MAR was reviewed and patient was compliant with medications. Patient seen, assessed, and discussed with attending Dr. Nelda Marseille.  Patient is more cooperative with assessment today, again stating that she does not need to be here.  She offers more detail into her history today, reporting that March 2022 is when the "demonic room" began to affect her, and in April 2022, she was admitted to [facility] where she was diagnosed with schizophrenia.  She reports her medical history is consistent with that documented, but she fails to mention her melanoma, stating that it is a diagnosis  received in April 2019 that she "does not want to talk about."  She admits to having radiation of her brain, which she says "affected my mental capacity" by "messing with my memory."  Today, she denies SI/HI, but reports AH of the holy spirit talking to her, VH "in the spirit realm as I am a daughter of the [employer]."  She voices delusions of having the "fullness of the deity" and reports having powers including being able to "prophylactically heal," as well as sending people to hell or the [facility] of fire; after reporting these things, she began to speak in tongues.  She ruminates on demonic people being out to get her, including LW Vinie Sill) and JP (did not clarify this person's name); she becomes too irritable when talking about them to specify from where she knows them, but says that they affect her living arrangements, have connections with the jail, and have connections here.  She denies ideas of reference.  When advised that she would have a whole body scan during this admission to assess for recurrence of disease, she was agreeable and appreciative.  When advised that per her guardian, she will be initiated on medications, she becomes agitated stating "I am not bound by legalities, and it is against my religion for you to do these things to me.  I do not have a guardian.  I am a child of [name]."  The assessment was discontinued at this point.  Collateral: Neysa Hotter, manager, from [place] called and gave consent for forced medications over objection protocol: Haldol 5 mg BID  PO or IM and Ativan 1 mg daily PO or IM (will split to 0.5 mg BID). As far as oncology goes, would advise to go through treatment unless patient rejects. Will have SW follow-up on patient's treatment preference. However, she agreed to complete whole body scan to prepare for outpatient oncologic care.  Principal Problem: Schizophrenia spectrum disorder with psychotic disorder type not yet determined ([place]) Diagnosis:  Principal Problem:   Schizophrenia spectrum disorder with psychotic disorder type not yet determined ([place]) Active Problems:   Brain metastasis ([place])   Melanoma of skin ([place])   Leukopenia   Thrombocytopenia ([place])  Total Time spent with patient: I personally spent 45 minutes on the unit in direct patient care. The direct patient care time included face-to-face time with the patient, reviewing the patient's chart, communicating with other professionals, and coordinating care. Greater than 50% of this time was spent in counseling or coordinating care with the patient regarding goals of hospitalization, psycho-education, and discharge planning needs.   Review of Records Summary: "History was obtained from review of her medical record.  It appears she has a history of melanoma of unknown primary diagnosed in July 2019 and was found to have stage IV disease including CNS, peritoneal and subcutaneous nodules.  She appears to have had whole brain radiation in August 2019 and had [place] and [name] treatments discontinued in August 2019 due to partial response and 12 cycles of Pembrolizumab starting in December 2019 and completed in July 2020.  Per her January 2022 office visit with oncology, at that time she was felt to have achieved a near complete response to a combination of oral targeted therapy and immunotherapy and at that time was on active surveillance without evidence of relapse of the disease.  Her records indicate that by April 2022 she was in the emergency department for evaluation of psychosis.  Her records at that time indicate that she had a previous history of bipolar and psychosis predating her metastatic melanoma.  It appears she has had multiple ED visits for psychosis since April 2022 and did have an MRI of the brain last done in April of 2022 which showed stable findings that were compatible with treated lesions and no new areas of enhancement or hemorrhage to suggest active disease or  disease progression.  In reviewing her previous medication trials it appears that the patient has been on Haldol, Zyprexa, Cogentin, Tegretol, Seroquel, IM Geodon, and Ativan in the past. "  Past Medical History:  Past Medical History:  Diagnosis Date   Allergy    Anemia    Anemia    Diabetes mellitus without complication ([place])    prediabetic   GERD (gastroesophageal reflux disease)    melanoma with met dz dx'd 01/2018   brain and adrenal   Seasonal allergies     Past Surgical History:  Procedure Laterality Date   BIOPSY  01/28/2018   Procedure: BIOPSY;  Surgeon: Laurence Spates, MD;  Location: [facility] ENDOSCOPY;  Service: Endoscopy;;   BREAST BIOPSY Left    ESOPHAGOGASTRODUODENOSCOPY N/A 01/28/2018   Procedure: ESOPHAGOGASTRODUODENOSCOPY (EGD);  Surgeon: Laurence Spates, MD;  Location: [facility];  Service: Endoscopy;  Laterality: N/A;   MOUTH SURGERY     OVARY SURGERY     removed "something"   Family History:  Family History  Problem Relation Age of Onset   Melanoma Mother    Hypertension Father    Breast cancer Maternal Aunt    Breast cancer Paternal Aunt    Diabetes Paternal Aunt  Breast cancer Maternal Grandmother    Breast cancer Paternal Grandmother    Diabetes Paternal Grandmother    Colon cancer Neg Hx    Esophageal cancer Neg Hx    Pancreatic cancer Neg Hx    Stomach cancer Neg Hx    Liver disease Neg Hx    Rectal cancer Neg Hx    Family Psychiatric  History: None reported, as patient becomes irritable discussing how her family members have IVC'd her in the past few months  Social History:  Social History   Substance and Sexual Activity  Alcohol Use No     Social History   Substance and Sexual Activity  Drug Use No    Social History   Socioeconomic History   Marital status: Single    Spouse name: Not on file   Number of children: Not on file   Years of education: Not on file   Highest education level: Not on file  Occupational History   Not on  file  Tobacco Use   Smoking status: Never   Smokeless tobacco: Never  Vaping Use   Vaping Use: Never used  Substance and Sexual Activity   Alcohol use: No   Drug use: No   Sexual activity: Not Currently  Other Topics Concern   Not on file  Social History Narrative   Not on file   Social Determinants of Health   Financial Resource Strain: Not on file  Food Insecurity: Not on file  Transportation Needs: Not on file  Physical Activity: Not on file  Stress: Not on file  Social Connections: Not on file   Additional Social History:  Living at area hotels;has a guardian with DSS  Sleep: Fair  Appetite:  Good  Current Medications: Current Facility-Administered Medications  Medication Dose Route Frequency Provider Last Rate Last Admin   acetaminophen (TYLENOL) tablet 650 mg  650 mg Oral Q6H PRN Rosezetta Schlatter, MD   650 mg at 06/28/21 0957   benztropine (COGENTIN) tablet 0.5 mg  0.5 mg Oral BID PRN Rosezetta Schlatter, MD       haloperidol lactate (HALDOL) injection 5 mg  5 mg Intramuscular Q6H PRN Leevy-Johnson, Brooke A, NP       And   benztropine mesylate (COGENTIN) injection 0.5 mg  0.5 mg Intramuscular BID PRN Leevy-Johnson, Brooke A, NP       diphenhydrAMINE (BENADRYL) capsule 50 mg  50 mg Oral Q6H PRN Damita Dunnings B, MD       Or   diphenhydrAMINE (BENADRYL) injection 50 mg  50 mg Intramuscular Q6H PRN Damita Dunnings B, MD       haloperidol (HALDOL) tablet 5 mg  5 mg Oral BID Rosezetta Schlatter, MD       Or   haloperidol lactate (HALDOL) injection 5 mg  5 mg Intramuscular BID Rosezetta Schlatter, MD       LORazepam (ATIVAN) tablet 0.5 mg  0.5 mg Oral BID Rosezetta Schlatter, MD       Or   LORazepam (ATIVAN) injection 0.5 mg  0.5 mg Intramuscular BID Rosezetta Schlatter, MD       LORazepam (ATIVAN) tablet 2 mg  2 mg Oral Q6H PRN Damita Dunnings B, MD   2 mg at 06/27/21 1149   Or   LORazepam (ATIVAN) injection 2 mg  2 mg Intramuscular Q6H PRN Freida Busman, MD        Lab Results:   Results for orders placed or performed during the hospital encounter of 06/26/21 (from the  past 48 hour(s))  TSH     Status: None   Collection Time: 06/28/21  6:32 AM  Result Value Ref Range   TSH 2.774 0.350 - 4.500 uIU/mL    Comment: Performed by a 3rd Generation assay with a functional sensitivity of <=0.01 uIU/mL. Performed at [facility] [place] [address] [place], [place] [address]   Lipid panel     Status: Abnormal   Collection Time: 06/28/21  6:32 AM  Result Value Ref Range   Cholesterol 212 (H) 0 - 200 mg/dL   Triglycerides 56 <150 mg/dL   HDL 63 >40 mg/dL   Total CHOL/HDL Ratio 3.4 RATIO   VLDL 11 0 - 40 mg/dL   LDL Cholesterol 138 (H) 0 - 99 mg/dL    Comment:        Total Cholesterol/HDL:CHD Risk Coronary Heart Disease Risk Table                     Men   Women  1/2 Average Risk   3.4   3.3  Average Risk       5.0   4.4  2 X Average Risk   9.6   7.1  3 X Average Risk  23.4   11.0        Use the calculated Patient Ratio above and the CHD Risk Table to determine the patient's CHD Risk.        ATP III CLASSIFICATION (LDL):  <100     mg/dL   Optimal  100-129  mg/dL   Near or Above                    Optimal  130-159  mg/dL   Borderline  160-189  mg/dL   High  >190     mg/dL   Very High Performed at [facility] [address], [place], [place] [address]     Blood Alcohol level:  Lab Results  Component Value Date   [facility] <10 06/25/2021   ETH <10 03/88/8280    Metabolic Disorder Labs: Lab Results  Component Value Date   HGBA1C 5.8 (H) 02/03/2018   MPG 119.76 02/03/2018   No results found for: PROLACTIN Lab Results  Component Value Date   CHOL 212 (H) 06/28/2021   TRIG 56 06/28/2021   HDL 63 06/28/2021   CHOLHDL 3.4 06/28/2021   VLDL 11 06/28/2021   LDLCALC 138 (H) 06/28/2021   LDLCALC 91 02/03/2018    Physical Findings:  Musculoskeletal: Strength & Muscle Tone: within normal limits Gait & Station:  normal Patient leans: N/A  Psychiatric Specialty Exam:  Presentation  General Appearance: Appropriate for Environment; Casual; Fairly Groomed  Eye Contact:Good  Speech:Clear and Coherent; Normal Rate  Speech Volume:Other (comment) (Initially normal, increased by end of assessment)  Handedness:Right   Mood and Affect  Mood:Irritable  Affect:Irritable, argumentative   Thought Process  Thought Processes: More linear today but concrete and at times avoidant  Orientation: Knows month, year, city and Scientist, clinical (histocompatibility and immunogenetics) Content: Has hyper-religious thinking and endorses delusion she can heal and send people to [place]; has persecutory delusions about demonic people and expresses paranoid thoughts about specific individuals; endorses AVH but denies SI, HI, or ideas of reference  History of Schizophrenia/Schizoaffective disorder:Yes (Patient reported diagnosis of schizophrenia at [facility] in April 2022)  Duration of Psychotic Symptoms:Greater than six months  Hallucinations:Hallucinations: Auditory; Visual  Ideas of Reference:Denied  Suicidal Thoughts:Suicidal Thoughts: No  Homicidal Thoughts:Homicidal  Thoughts: No   Sensorium  Memory:Immediate Fair; Recent Fair  Judgment:Impaired  Insight:Poor  Executive Functions  Concentration:Fair  Attention Span:Fair  [place]   Psychomotor Activity  Psychomotor Activity:Psychomotor Activity: Normal   Assets  Assets:Resilience, has guardian, housing   Sleep  Sleep:Number of Hours of Sleep: Not documented   Physical Exam Vitals and nursing note reviewed.  Constitutional:      General: She is not in acute distress.    Appearance: She is ill-appearing.     Comments: Chronically ill-appearing  HENT:     Head: Normocephalic and atraumatic.     Mouth/Throat:     Comments: With sore to right sided upper lip; healing appropriately Pulmonary:     Effort: Pulmonary effort  is normal.  Neurological:     General: No focal deficit present.     Mental Status: She is alert and oriented to person, place, and time.     Motor: No weakness.     Gait: Gait normal.   Review of Systems  Gastrointestinal:  Positive for abdominal pain. Negative for constipation, diarrhea, nausea and vomiting.       Reported lower abdominal pain today  Genitourinary: Negative.   Musculoskeletal:  Positive for back pain.       Reported lower back pain  Blood pressure 128/87, pulse 80, temperature 97.8 F (36.6 C), temperature source Oral, resp. rate 18, height $RemoveBe'5\' 1"'RTALXjrJS$  (1.549 m), weight 56 kg, SpO2 100 %. Body mass index is 23.32 kg/m.   Treatment Plan Summary: ASSESSMENT: Principal Problem:   Schizophrenia spectrum disorder with psychotic disorder type not yet determined ([place]) Active Problems:   Brain metastasis ([facility])   Melanoma of skin (H[facility])   Leukopenia   Thrombocytopenia (H[facility])     The patient is a 50 year old female with a reported history of bipolar disorder and unspecified psychosis, and a medical history of melanoma with metastases to the brain, admitted under IVC for bizarre behaviors, agitation, delusions, and hyperreligiosity.  On assessment, patient remains irritable and hyperreligious, with residual delusions, report of AVH, and paranoia on exam. B[facility] day 2.    Treatment Plan Summary: Daily contact with patient to assess and evaluate symptoms and progress in treatment and Medication management    Safety and Monitoring: INVOLUTARILY (by [employer]) admission to inpatient psychiatric unit for safety, stabilization and treatment Daily contact with patient to assess and evaluate symptoms and progress in treatment Patient's case to be discussed in multi-disciplinary team meeting Observation Level : q15 minute checks Vital signs: q12 hours Precautions: suicide, elopement, and assault   2. Psychiatric Problems #Unspecified schizophrenia spectrum and other psychotic d/o (r/o  psychosis secondary to general medical condition - metatstatic melanoma vs bipolar d/o MRE manic with psychotic features) Notes from previous [place] visit report some baseline delusions, hyperreligiosity in 01/2021, with problems ongoing since 09/2020, per patient's reporting.  -With guardian's permission, initiate Haldol 5 mg twice daily p.o. or IM and Ativan 0.5 mg twice daily p.o. or IM with forced medication over objection protocol in place - Continue as needed agitation protocol of Haldol 5 mg every 8 hours p.o. or IM, with/ or Ativan 1 mg every 8 hours p.o. or IM and Benadryl $RemoveBefo'50mg'[id number]$  po or IM every 6 hours PRN agitation - Continue Cogentin 0.5 mg twice daily PRN   3. Medical Management Covid negative CMP: Calcium 8.4 CBC: WBC 3.9, H/H 11.7/37.1, platelets 60, ANC 2700 EtOH: <10 UDS: Negative TSH: 2.774 A1C: 5.1 Lipids: Cholesterol 212, LDL  138 QTC 419ms   #History of metastatic melanoma Metastases to the brain and adrenals first reported in 2019, and patient had full brain irradiation around that time.  Patient was with regular follow-up until April of this year, and was lost to follow-up for her July appointment and beyond. - Reached out to oncology: Per Dr. Lindi Adie- Verify with guardian whether she wants to go through treatment. If she wants to go through treatment, she will need a Whole body scan and to follow up in clinic outpatient; if not, consider hospice. - See HPI for guardian's wishes - will clarify type of imaging recommended by oncology    #Thrombocytopenia Platelets 60 Appears chronic (platelets down to 57 5 months ago)- will monitor with serial CBCs   #Leukopenia ANC 2700 and WBC 3.9 - will monitor with serial CBCs  4. Discharge Planning:              -- Social work and case management to assist with discharge planning and identification of hospital follow-up needs prior to discharge.              -- Estimated LOS: 7-10 days, pending clinical improvement             --  Discharge Concerns: Need to establish a safety plan; Medication compliance and effectiveness             -- Discharge Goals: Return to hotel with outpatient referrals for mental health follow-up including medication management/psychotherapy   Rosezetta Schlatter, MD 06/28/2021, 12:24 PM

## 2021-06-29 ENCOUNTER — Encounter (HOSPITAL_COMMUNITY): Payer: Self-pay

## 2021-06-29 DIAGNOSIS — F29 Unspecified psychosis not due to a substance or known physiological condition: Secondary | ICD-10-CM | POA: Diagnosis not present

## 2021-06-29 MED ORDER — WHITE PETROLATUM EX OINT
TOPICAL_OINTMENT | CUTANEOUS | Status: AC
  Filled 2021-06-29: qty 5

## 2021-06-29 NOTE — Progress Notes (Signed)
Pt stayed in her room much of the evening, pt had little interaction with staff this evening    06/29/21 2200  Psych Admission Type (Psych Patients Only)  Admission Status Involuntary  Psychosocial Assessment  Patient Complaints Suspiciousness;Anxiety  Eye Contact Intense  Facial Expression Angry;Anxious;Worried  Affect Angry;Anxious;Apprehensive;Preoccupied  Speech Argumentative;Pressured  Interaction Defensive;Demanding;Hostile  Motor Activity Slow  Appearance/Hygiene Disheveled  Behavior Characteristics Irritable  Mood Suspicious  Thought Process  Coherency Concrete thinking  Content Blaming others;Religiosity;Paranoia  Delusions Religious  Perception Derealization  Hallucination None reported or observed  Judgment Impaired  Confusion Mild  Danger to Self  Current suicidal ideation? Denies  Danger to Others  Danger to Others None reported or observed

## 2021-06-29 NOTE — Group Note (Signed)
Recreation Therapy Group Note   Group Topic:Leisure Education  Group Date: 06/29/2021 Start Time: 1000 End Time: 1035 Facilitators: Victorino Sparrow, LRT,CTRS Location: [address] Dayroom   Goal Area(s) Addresses:  Patient will identify positive leisure and recreation activities.  Patient will identify one positive benefit of participation in leisure activities.   Group Description:   Keep It Going Volleyball.  Patients were placed in a circle.  Patients were given a beach ball, which they were to pass or bounce to each without the ball coming to a complete stop.  LRT would time the group to see how long they could keep the ball in motion.  If at any point the ball came to a stop, the time would start over.   Affect/Mood: N/A   Participation Level: Did not attend    Clinical Observations/Individualized Feedback:     Plan: Continue to engage patient in RT group sessions 2-3x/week.   Victorino Sparrow, Glennis Brink 06/29/2021 12:28 PM

## 2021-06-29 NOTE — Progress Notes (Signed)
Pt continues to be anxious.  Pt complied with scheduled medications. Pt currently denies SI/ HI/AVH and pain to this writer during the shift. Pt had little interaction with others during this shift. Pt verbally contracted for safety.   A: Support and encouragement was offered and accepted. Pt medications were administered per order. Safety rounds continued and maintained Q 15 throughout the shift.    R: Safety maintained. Will continue to monitor and assess.    06/29/21 0000  Psych Admission Type (Psych Patients Only)  Admission Status Involuntary  Psychosocial Assessment  Eye Contact Intense  Facial Expression Angry;Anxious;Worried  Affect Angry;Anxious;Apprehensive;Preoccupied  Speech Argumentative;Pressured  Interaction Defensive;Demanding;Hostile  Motor Activity Slow  Appearance/Hygiene Disheveled  Behavior Characteristics Cooperative;Appropriate to situation  Thought Pension scheme manager thinking  Content Blaming others;Religiosity;Paranoia  Delusions Religious  Perception Derealization  Hallucination None reported or observed  Judgment Impaired  Confusion Mild  Danger to Self  Current suicidal ideation? Denies  Danger to Others  Danger to Others None reported or observed

## 2021-06-29 NOTE — Progress Notes (Signed)
Pt was irritable and confused.  Pt is religiously preoccuppied,  Pt has refused medications today and needed to receive forced IM haldol per provider orders this morning.  Pt reluctantly decided to take her ativan and haldol by mouth at afternoon dose.

## 2021-06-29 NOTE — Progress Notes (Addendum)
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  Chart Review of Past 24 hrs: The patient's chart was reviewed and nursing notes were reviewed. The patient's case was discussed in multidisciplinary team meeting.  Per MAR: - Patient will have scheduled meds today. - PRNs: Tylenol x 1 Per RN notes, patient is irritable and hyper religious, loudly praying and declaring that she has no need to be here, but she is attending some groups. Patient sleep hours not documented  Patient had the following psychiatric recommendations yesterday:  -With guardian's permission, initiate Haldol 5 mg twice daily p.o. or IM and Ativan 0.5 mg twice daily p.o. or IM with forced medication over objection protocol in place - Continue as needed agitation protocol of Haldol 5 mg every 8 hours p.o. or IM, with/ or Ativan 1 mg every 8 hours p.o. or IM and Benadryl $RemoveBefo'50mg'[id number]$  po or IM every 6 hours PRN agitation - Continue Cogentin 0.5 mg twice daily PRN   On Today's Assessment (06/29/2021): Case was discussed in the multidisciplinary team. MAR was reviewed and patient was compliant with medications. Patient seen, assessed, and discussed with attending Dr. Nelda Marseille.  Patient is receiving forced med over objection dose of morning Haldol as our team enters the room, yelling that she is "a child of [name] " and does not need to be here.  She calls all members of our team "witches" today, as we do not adhere to her wishes to leave.  She denies SI/HI today, but continues to voice delusions of being a "biblically based author and prophet" as well as  having powers to deliver and heal people.  She denies ideas of reference but reports residual AH of [name] and VH of demonic nature. She ruminates on demonic people being out to get her, suing all members of the hospital staff, and receiving medication against her wishes. She continues to deny belief that she has a guardian.   When again advised that she would have her concerns met with a whole body scan during this admission to assess for recurrence of disease, she was agreeable and appreciative. She reports stable sleep and appetite but admits she is not showering or changing clothes because she does not believe she needs to be inpatient. She reports some residual mild HA and low back pain today but voices no other physical issues.  Principal Problem: Schizophrenia spectrum disorder with psychotic disorder type not yet determined ([place]) Diagnosis: Principal Problem:   Schizophrenia spectrum disorder with psychotic disorder type not yet determined ([place]) Active Problems:   Brain metastasis ([place])   Melanoma of skin ([place])   Leukopenia   Thrombocytopenia ([place])  Total Time spent with patient: I personally spent 35 minutes on the unit in direct patient care. The direct patient care time included face-to-face time with the patient, reviewing the patient's chart, communicating with other professionals, and coordinating care. Greater than 50% of this time was spent in counseling or coordinating care with the patient regarding goals of hospitalization, psycho-education, and discharge planning needs.   Review of Records Summary: "History was obtained from review of her medical record.  It appears she has a history  of melanoma of unknown primary diagnosed in July 2019 and was found to have stage IV disease including CNS, peritoneal and subcutaneous nodules.  She appears to have had whole brain radiation in August 2019 and had [place] and [name] treatments discontinued in August 2019 due to partial response and 12  cycles of Pembrolizumab starting in December 2019 and completed in July 2020.  Per her January 2022 office visit with oncology, at that time she was felt to have achieved a near complete response to a combination of oral targeted therapy and immunotherapy and at that time was on active surveillance without evidence of relapse of the disease.  Her records indicate that by April 2022 she was in the emergency department for evaluation of psychosis.  Her records at that time indicate that she had a previous history of bipolar and psychosis predating her metastatic melanoma.  It appears she has had multiple ED visits for psychosis since April 2022 and did have an MRI of the brain last done in April of 2022 which showed stable findings that were compatible with treated lesions and no new areas of enhancement or hemorrhage to suggest active disease or disease progression.  In reviewing her previous medication trials it appears that the patient has been on Haldol, Zyprexa, Cogentin, Tegretol, Seroquel, IM Geodon, and Ativan in the past. "  Past Medical History:  Past Medical History:  Diagnosis Date   Allergy    Anemia    Anemia    Diabetes mellitus without complication ([place])    prediabetic   GERD (gastroesophageal reflux disease)    melanoma with met dz dx'd 01/2018   brain and adrenal   Seasonal allergies     Past Surgical History:  Procedure Laterality Date   BIOPSY  01/28/2018   Procedure: BIOPSY;  Surgeon: Laurence Spates, MD;  Location: [facility];  Service: Endoscopy;;   BREAST BIOPSY Left    ESOPHAGOGASTRODUODENOSCOPY N/A 01/28/2018   Procedure: ESOPHAGOGASTRODUODENOSCOPY (EGD);  Surgeon: Laurence Spates, MD;  Location: [facility];  Service: Endoscopy;  Laterality: N/A;   MOUTH SURGERY     OVARY SURGERY     removed "something"   Family History:  Family History  Problem Relation Age of Onset   Melanoma Mother    Hypertension Father    Breast cancer Maternal Aunt    Breast cancer  Paternal Aunt    Diabetes Paternal Aunt    Breast cancer Maternal Grandmother    Breast cancer Paternal Grandmother    Diabetes Paternal Grandmother    Colon cancer Neg Hx    Esophageal cancer Neg Hx    Pancreatic cancer Neg Hx    Stomach cancer Neg Hx    Liver disease Neg Hx    Rectal cancer Neg Hx    Family Psychiatric  History: None reported, as patient becomes irritable discussing how her family members have IVC'd her in the past few months  Social History:  Social History   Substance and Sexual Activity  Alcohol Use No     Social History   Substance and Sexual Activity  Drug Use No    Social History   Socioeconomic History   Marital status: Single    Spouse name: Not on file   Number of children: Not on file   Years of education: Not on file   Highest education level: Not on file  Occupational History   Not on file  Tobacco Use   Smoking status: Never   Smokeless tobacco: Never  Vaping Use  Vaping Use: Never used  Substance and Sexual Activity   Alcohol use: No   Drug use: No   Sexual activity: Not Currently  Other Topics Concern   Not on file  Social History Narrative   Not on file   Social Determinants of Health   Financial Resource Strain: Not on file  Food Insecurity: Not on file  Transportation Needs: Not on file  Physical Activity: Not on file  Stress: Not on file  Social Connections: Not on file   Additional Social History:  Living at area hotels;has a guardian with DSS  Sleep: Good per patient report  Appetite:  Good  Current Medications: Current Facility-Administered Medications  Medication Dose Route Frequency Provider Last Rate Last Admin   acetaminophen (TYLENOL) tablet 650 mg  650 mg Oral Q6H PRN Rosezetta Schlatter, MD   650 mg at 06/28/21 0957   benztropine (COGENTIN) tablet 0.5 mg  0.5 mg Oral BID PRN Rosezetta Schlatter, MD       diphenhydrAMINE (BENADRYL) capsule 50 mg  50 mg Oral Q6H PRN Damita Dunnings B, MD       Or    diphenhydrAMINE (BENADRYL) injection 50 mg  50 mg Intramuscular Q6H PRN Damita Dunnings B, MD       haloperidol (HALDOL) tablet 5 mg  5 mg Oral BID Nelda Marseille, Yunuen Mordan E, MD   5 mg at 06/29/21 1000   Or   haloperidol lactate (HALDOL) injection 5 mg  5 mg Intramuscular BID Nelda Marseille, Antoinette Borgwardt E, MD       haloperidol (HALDOL) tablet 5 mg  5 mg Oral Q8H PRN Nelda Marseille, Victor Langenbach E, MD       Or   haloperidol lactate (HALDOL) injection 5 mg  5 mg Intramuscular Q8H PRN Nelda Marseille, Francheska Villeda E, MD       LORazepam (ATIVAN) tablet 0.5 mg  0.5 mg Oral BID Nelda Marseille, Dantae Meunier E, MD   0.5 mg at 06/29/21 9242   Or   LORazepam (ATIVAN) injection 0.5 mg  0.5 mg Intramuscular BID Nelda Marseille, Mickell Birdwell E, MD       LORazepam (ATIVAN) tablet 1 mg  1 mg Oral Q8H PRN Harlow Asa, MD       Or   LORazepam (ATIVAN) injection 1 mg  1 mg Intramuscular Q8H PRN Harlow Asa, MD        Lab Results:  Results for orders placed or performed during the hospital encounter of 06/26/21 (from the past 48 hour(s))  TSH     Status: None   Collection Time: 06/28/21  6:32 AM  Result Value Ref Range   TSH 2.774 0.350 - 4.500 uIU/mL    Comment: Performed by a 3rd Generation assay with a functional sensitivity of <=0.01 uIU/mL. Performed at [facility], [place] [address] [place], [place] [address]   Hemoglobin A1c     Status: None   Collection Time: 06/28/21  6:32 AM  Result Value Ref Range   Hgb A1c MFr Bld 5.1 4.8 - 5.6 %    Comment: (NOTE) Pre diabetes:          5.7%-6.4%  Diabetes:              >6.4%  Glycemic control for   <7.0% adults with diabetes    Mean Plasma Glucose 99.67 mg/dL    Comment: Performed at [facility] [address], [place], [place] [address]  Lipid panel     Status: Abnormal   Collection Time: 06/28/21  6:32 AM  Result Value Ref Range   Cholesterol 212 (H) 0 - 200 mg/dL   Triglycerides 56 <150 mg/dL   HDL 63 >40 mg/dL   Total CHOL/HDL Ratio 3.4 RATIO   VLDL 11 0 - 40 mg/dL   LDL Cholesterol  138 (H) 0 - 99 mg/dL    Comment:        Total Cholesterol/HDL:CHD Risk Coronary Heart Disease Risk Table                     Men   Women  1/2 Average Risk   3.4   3.3  Average Risk       5.0   4.4  2 X Average Risk   9.6   7.1  3 X Average Risk  23.4   11.0        Use the calculated Patient Ratio above and the CHD Risk Table to determine the patient's CHD Risk.        ATP III CLASSIFICATION (LDL):  <100     mg/dL   Optimal  100-129  mg/dL   Near or Above                    Optimal  130-159  mg/dL   Borderline  160-189  mg/dL   High  >190     mg/dL   Very High Performed at [facility] [address], [place], [place] [address]     Blood Alcohol level:  Lab Results  Component Value Date   [facility] <10 06/25/2021   ETH <10 73/41/9379    Metabolic Disorder Labs: Lab Results  Component Value Date   HGBA1C 5.1 06/28/2021   MPG 99.67 06/28/2021   MPG 119.76 02/03/2018   No results found for: PROLACTIN Lab Results  Component Value Date   CHOL 212 (H) 06/28/2021   TRIG 56 06/28/2021   HDL 63 06/28/2021   CHOLHDL 3.4 06/28/2021   VLDL 11 06/28/2021   LDLCALC 138 (H) 06/28/2021   LDLCALC 91 02/03/2018    Physical Findings:  Musculoskeletal: Strength & Muscle Tone: within normal limits Gait & Station: normal Patient leans: N/A  Psychiatric Specialty Exam:  Presentation  General Appearance: Appropriate for Environment; Casual; Fairly Groomed  Eye Contact:Good  Speech:Clear and Coherent; Normal Rate  Speech Volume: Becomes loud when she is agitated  Handedness:Right   Mood and Affect  Mood:Irritable  Affect:Irritable, argumentative, labile   Thought Process  Thought Processes: More linear today but concrete and at times avoidant; ruminative about discharge planning  Orientation: Knows month, year, city   Thought Content: Has hyper-religious thinking and endorses delusion she can heal and deliver people; has ideas of influence that  her beliefs can cast people to "[place]"; has persecutory delusions about demonic people and continues to express paranoid thoughts about people; endorses AVH but denies SI, HI, or ideas of reference  History of Schizophrenia/Schizoaffective disorder:Yes (Patient reported diagnosis of schizophrenia at [facility] in April 2022)  Duration of Psychotic Symptoms:Greater than six months  Hallucinations:Hallucinations: Auditory; Visual  Ideas of Reference:Denied but has ideas of influence  Suicidal Thoughts:Suicidal Thoughts: No  Homicidal Thoughts:Homicidal Thoughts: No   Sensorium  Memory:Immediate Fair; Recent Fair  Judgment:Impaired  Insight:Poor  Executive Functions  Concentration:Fair  Attention Span:Fair  [place]   Psychomotor Activity  Psychomotor Activity:Agitated at start of interview - has normal psychomotor activity at conclusion of interview   Assets  Assets:Resilience, has guardian, housing  Sleep  Sleep:Number of Hours of Sleep: Not documented   Physical Exam Vitals and nursing note reviewed.  Constitutional:      General: She is not in acute distress.    Appearance: Normal appearance. She is ill-appearing.     Comments: Chronically ill-appearing  HENT:     Head: Normocephalic and atraumatic.     Mouth/Throat:     Comments: With sore to right sided upper lip; healing appropriately Pulmonary:     Effort: Pulmonary effort is normal.  Neurological:     General: No focal deficit present.     Mental Status: She is alert and oriented to person, place, and time.     Motor: No weakness.     Gait: Gait normal.   Review of Systems  Gastrointestinal:  Positive for abdominal pain. Negative for constipation, diarrhea, nausea and vomiting.       Reported lower abdominal pain today  Genitourinary: Negative.   Musculoskeletal:  Positive for back pain.       Reported lower back pain  Neurological:  Positive for  headaches.  Blood pressure (!) 142/95, pulse 80, temperature 97.7 F (36.5 C), temperature source Oral, resp. rate 16, height _0  (1.549 m), weight 56 kg, SpO2 98 %. Body mass index is 23.32 kg/m.   Treatment Plan Summary: ASSESSMENT: Principal Problem:   Schizophrenia spectrum disorder with psychotic disorder type not yet determined ([place]) Active Problems:   Brain metastasis (H[facility])   Melanoma of skin (HCC)   Leukopenia   Thrombocytopenia (H[facility])     The patient is a 50 year old female with a reported history of bipolar disorder and unspecified psychosis, and a medical history of melanoma with metastases to the brain, admitted under IVC for bizarre behaviors, agitation, delusions, and hyperreligiosity.  On assessment, patient remains irritable and hyperreligious, with residual delusions, report of AVH, and paranoia on exam.    Treatment Plan Summary: Daily contact with patient to assess and evaluate symptoms and progress in treatment and Medication management    Safety and Monitoring: INVOLUTARILY (by [employer]) admission to inpatient psychiatric unit for safety, stabilization and treatment Daily contact with patient to assess and evaluate symptoms and progress in treatment Patient's case to be discussed in multi-disciplinary team meeting Observation Level : q15 minute checks Vital signs: q12 hours Precautions: suicide, elopement, and assault   2. Psychiatric Problems #Unspecified schizophrenia spectrum and other psychotic d/o (r/o psychosis secondary to general medical condition - metatstatic melanoma vs bipolar d/o MRE manic with psychotic features) Notes from previous [place] visit report some baseline delusions, hyperreligiosity in 01/2021, with problems ongoing since 09/2020, per patient's reporting.  -Continue Haldol 5 mg twice daily p.o. or IM and Ativan 0.5 mg twice daily p.o. or IM with forced medication over objection protocol in place - Continue as needed agitation protocol of Haldol  5 mg every 8 hours p.o. or IM, with/ or Ativan 1 mg every 8 hours p.o. or IM and Benadryl 60m po or IM every 6 hours PRN agitation - Continue Cogentin 0.5 mg twice daily PRN - will Check AIMS as she will cooperate for testing   3. Medical Management Covid negative CMP: Calcium 8.4 CBC: WBC 3.9, H/H 11.7/37.1, platelets 60, ANC 2700 EtOH: <10 UDS: Negative TSH: 2.774 A1C: 5.1 Lipids: Cholesterol 212, LDL 138 QTC 4283m  #History of metastatic melanoma Metastases to the brain and adrenals first reported in 2019, and patient had full brain irradiation around that time.  Patient was with regular follow-up until April  of this year, and was lost to follow-up for her July appointment and beyond. - Reached out to oncology: Per Dr. Lindi Adie- Verify with guardian whether she wants to go through treatment. If she wants to go through treatment, she will need a Whole body scan and to follow up in clinic outpatient; if not, consider hospice. -Awaiting guardian's return from vacation to discuss wishes - will clarify type of imaging recommended by oncology    #Thrombocytopenia Platelets 60 Appears chronic (platelets down to 57 5 months ago)- will monitor with serial CBCs   #Leukopenia ANC 2700 and WBC 3.9 - will monitor with serial CBCs  4. Discharge Planning:              -- Social work and case management to assist with discharge planning and identification of hospital follow-up needs prior to discharge.              -- Estimated LOS: 7-10 days, pending clinical improvement             -- Discharge Concerns: Need to establish a safety plan; Medication compliance and effectiveness             -- Discharge Goals: Return to hotel with outpatient referrals for mental health follow-up including medication management/psychotherapy   Rosezetta Schlatter, MD 06/29/2021, 10:25 AM

## 2021-06-29 NOTE — BH IP Treatment Plan (Signed)
Interdisciplinary Treatment and Diagnostic Plan Update  06/29/2021 Kaitlyn Duncan MRN: [id number]  Principal Diagnosis: Schizophrenia spectrum disorder with psychotic disorder type not yet determined [facility])  Secondary Diagnoses: Principal Problem:   Schizophrenia spectrum disorder with psychotic disorder type not yet determined [facility]) Active Problems:   Brain metastasis ([place])   Melanoma of skin ([place])   Leukopenia   Thrombocytopenia ([place])   Current Medications:  Current Facility-Administered Medications  Medication Dose Route Frequency Provider Last Rate Last Admin   acetaminophen (TYLENOL) tablet 650 mg  650 mg Oral Q6H PRN Rosezetta Schlatter, MD   650 mg at 06/28/21 0957   benztropine (COGENTIN) tablet 0.5 mg  0.5 mg Oral BID PRN Rosezetta Schlatter, MD       diphenhydrAMINE (BENADRYL) capsule 50 mg  50 mg Oral Q6H PRN Freida Busman, MD       Or   diphenhydrAMINE (BENADRYL) injection 50 mg  50 mg Intramuscular Q6H PRN Damita Dunnings B, MD       haloperidol (HALDOL) tablet 5 mg  5 mg Oral BID Nelda Marseille, Amy E, MD   5 mg at 06/29/21 1000   Or   haloperidol lactate (HALDOL) injection 5 mg  5 mg Intramuscular BID Nelda Marseille, Amy E, MD       haloperidol (HALDOL) tablet 5 mg  5 mg Oral Q8H PRN Harlow Asa, MD       Or   haloperidol lactate (HALDOL) injection 5 mg  5 mg Intramuscular Q8H PRN Harlow Asa, MD       LORazepam (ATIVAN) tablet 0.5 mg  0.5 mg Oral BID Nelda Marseille, Amy E, MD   0.5 mg at 06/29/21 1062   Or   LORazepam (ATIVAN) injection 0.5 mg  0.5 mg Intramuscular BID Harlow Asa, MD       LORazepam (ATIVAN) tablet 1 mg  1 mg Oral Q8H PRN Harlow Asa, MD       Or   LORazepam (ATIVAN) injection 1 mg  1 mg Intramuscular Q8H PRN Harlow Asa, MD       PTA Medications: Medications Prior to Admission  Medication Sig Dispense Refill Last Dose   clotrimazole (LOTRIMIN) 1 % cream Apply to affected area 2 times daily (Patient not taking: Reported on 06/25/2021)  15 g 0    fluticasone (FLONASE) 50 MCG/ACT nasal spray Place 2 sprays into both nostrils daily. (Patient not taking: Reported on 06/25/2021) 16 g 2    hydrocortisone cream 1 % Apply to affected area 2 times daily (Patient not taking: Reported on 06/25/2021) 15 g 0     Patient Stressors: Marital or family conflict   Medication change or noncompliance    Patient Strengths: General fund of knowledge  Motivation for treatment/growth   Treatment Modalities: Medication Management, Group therapy, Case management,  1 to 1 session with clinician, Psychoeducation, Recreational therapy.   Physician Treatment Plan for Primary Diagnosis: Schizophrenia spectrum disorder with psychotic disorder type not yet determined ([place]) Long Term Goal(s): Improvement in symptoms so as ready for discharge   Short Term Goals: Ability to identify changes in lifestyle to reduce recurrence of condition will improve Ability to identify and develop effective coping behaviors will improve Ability to maintain clinical measurements within normal limits will improve Compliance with prescribed medications will improve Ability to verbalize feelings will improve Ability to demonstrate self-control will improve  Medication Management: Evaluate patient's response, side effects, and tolerance of medication regimen.  Therapeutic Interventions: 1 to 1 sessions, Unit Group sessions  and Medication administration.  Evaluation of Outcomes: Not Met  Physician Treatment Plan for Secondary Diagnosis: Principal Problem:   Schizophrenia spectrum disorder with psychotic disorder type not yet determined ([place]) Active Problems:   Brain metastasis ([place])   Melanoma of skin ([place])   Leukopenia   Thrombocytopenia ([place])  Long Term Goal(s): Improvement in symptoms so as ready for discharge   Short Term Goals: Ability to identify changes in lifestyle to reduce recurrence of condition will improve Ability to identify and develop effective  coping behaviors will improve Ability to maintain clinical measurements within normal limits will improve Compliance with prescribed medications will improve Ability to verbalize feelings will improve Ability to demonstrate self-control will improve     Medication Management: Evaluate patient's response, side effects, and tolerance of medication regimen.  Therapeutic Interventions: 1 to 1 sessions, Unit Group sessions and Medication administration.  Evaluation of Outcomes: Not Met   RN Treatment Plan for Primary Diagnosis: Schizophrenia spectrum disorder with psychotic disorder type not yet determined ([place]) Long Term Goal(s): Knowledge of disease and therapeutic regimen to maintain health will improve  Short Term Goals: Ability to verbalize feelings will improve, Ability to identify and develop effective coping behaviors will improve, and Compliance with prescribed medications will improve  Medication Management: RN will administer medications as ordered by provider, will assess and evaluate patient's response and provide education to patient for prescribed medication. RN will report any adverse and/or side effects to prescribing provider.  Therapeutic Interventions: 1 on 1 counseling sessions, Psychoeducation, Medication administration, Evaluate responses to treatment, Monitor vital signs and CBGs as ordered, Perform/monitor CIWA, COWS, AIMS and Fall Risk screenings as ordered, Perform wound care treatments as ordered.  Evaluation of Outcomes: Not Met   LCSW Treatment Plan for Primary Diagnosis: Schizophrenia spectrum disorder with psychotic disorder type not yet determined ([place]) Long Term Goal(s): Safe transition to appropriate next level of care at discharge, Engage patient in therapeutic group addressing interpersonal concerns.  Short Term Goals: Engage patient in aftercare planning with referrals and resources, Increase social support, and Increase ability to appropriately verbalize  feelings  Therapeutic Interventions: Assess for all discharge needs, 1 to 1 time with Social worker, Explore available resources and support systems, Assess for adequacy in community support network, Educate family and significant other(s) on suicide prevention, Complete Psychosocial Assessment, Interpersonal group therapy.  Evaluation of Outcomes: Not Met   Progress in Treatment: Attending groups: No. Participating in groups: No. Taking medication as prescribed: No. Toleration medication: No. Family/Significant other contact made: Yes, individual(s) contacted:  Pt's legal guardain Patient understands diagnosis: No. Discussing patient identified problems/goals with staff: No. Medical problems stabilized or resolved: Yes. Denies suicidal/homicidal ideation: Yes. Issues/concerns per patient self-inventory: No. Other: None  New problem(s) identified: No, Describe:  None  New Short Term/Long Term Goal(s):medication stabilization, elimination of SI thoughts, development of comprehensive mental wellness plan.   Patient Goals:  Pt was unable to participate due to having to receive force medications.   Discharge Plan or Barriers: Patient recently admitted. CSW will continue to follow and assess for appropriate referrals and possible discharge planning.   Reason for Continuation of Hospitalization: Aggression Delusions  Hallucinations Mania Medication stabilization  Estimated Length of Stay: 3-5 days   Scribe for Treatment Team: Eliott Nine 06/29/2021 10:24 AM
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
## 2021-06-29 NOTE — Group Note (Signed)
LCSW Group Therapy Note   Group Date: 06/29/2021 Start Time: 1300 End Time: 1400  Type of Therapy and Topic:  Group Therapy - Who Am I?  Participation Level:  Did Not Attend   Description of Group The focus of this group was to aid patients in self-exploration and awareness. Patients were guided in exploring various factors of oneself to include interests, readiness to change, management of emotions, and individual perception of self. Patients were provided with complementary worksheets exploring hidden talents, ease of asking other for help, music/media preferences, understanding and responding to feelings/emotions, and hope for the future. At group closing, patients were encouraged to adhere to discharge plan to assist in continued self-exploration and understanding.  Therapeutic Goals Patients learned that self-exploration and awareness is an ongoing process Patients identified their individual skills, preferences, and abilities Patients explored their openness to establish and confide in supports Patients explored their readiness for change and progression of mental health      Therapeutic Modalities Cognitive Behavioral Therapy Motivational Interviewing Percell Miller 06/29/2021  2:37 PM

## 2021-06-29 NOTE — Progress Notes (Signed)
CSW attempted to contact pt's legal guardian, Kem Kays [phone] received a recording that office is closed in observance of Christmas Holiday. CSW left message requesting a return call.  Charlene Brooke, LCSWA 9:17 am

## 2021-06-29 NOTE — Progress Notes (Signed)
Adult Psychoeducational Group Note  Date:  06/29/2021 Time:  8:57 PM  Group Topic/Focus:  Wrap-Up Group:   The focus of this group is to help patients review their daily goal of treatment and discuss progress on daily workbooks.  Participation Level:  Did Not Attend  Participation Quality:   Did Not Attend  Affect:   Did Not Attend  Cognitive:   Did Not Attend  Insight: None  Engagement in Group:   Did Not Attend  Modes of Intervention:   Did Not Attend  Additional Comments:  Pt did not attend evening wrap up group tonight.  [name] 06/29/2021, 8:57 PM

## 2021-06-29 NOTE — Plan of Care (Signed)
Collateral:  Spoke to guardian, [name]. I answered her questions about patient's care thus far and advised that with Diane's permission, we will obtain a full body scan for surveillance of her melanoma. Adrienne advised that she would discuss patient's wishes for management with her once resulted. This Probation officer also advised that Vincente Liberty would need to discuss housing options with our social workers in preparation for discharge planning. She voiced understanding and had no additional questions for this Probation officer.   Rosezetta Schlatter, MD PGY-1 06/29/2021 [facility] Department of Psychiatry

## 2021-06-30 ENCOUNTER — Inpatient Hospital Stay (HOSPITAL_COMMUNITY): Admit: 2021-06-30 | Discharge: 2021-06-30 | Disposition: A | Discharge status: Home / Self Care | Payer: Medicare Other

## 2021-06-30 ENCOUNTER — Encounter (HOSPITAL_COMMUNITY): Payer: Self-pay

## 2021-06-30 DIAGNOSIS — D259 Leiomyoma of uterus, unspecified: Secondary | ICD-10-CM | POA: Diagnosis not present

## 2021-06-30 DIAGNOSIS — I700 Atherosclerosis of aorta: Secondary | ICD-10-CM | POA: Diagnosis not present

## 2021-06-30 DIAGNOSIS — C439 Malignant melanoma of skin, unspecified: Secondary | ICD-10-CM | POA: Diagnosis not present

## 2021-06-30 DIAGNOSIS — K829 Disease of gallbladder, unspecified: Secondary | ICD-10-CM | POA: Diagnosis not present

## 2021-06-30 DIAGNOSIS — N8500 Endometrial hyperplasia, unspecified: Secondary | ICD-10-CM | POA: Diagnosis not present

## 2021-06-30 DIAGNOSIS — K7689 Other specified diseases of liver: Secondary | ICD-10-CM | POA: Diagnosis not present

## 2021-06-30 DIAGNOSIS — F29 Unspecified psychosis not due to a substance or known physiological condition: Principal | ICD-10-CM

## 2021-06-30 DIAGNOSIS — S0990XA Unspecified injury of head, initial encounter: Secondary | ICD-10-CM | POA: Diagnosis not present

## 2021-06-30 LAB — CBC WITH DIFFERENTIAL/PLATELET
Abs Immature Granulocytes: 0.01 10*3/uL (ref 0.00–0.07)
Basophils Absolute: 0 10*3/uL (ref 0.0–0.1)
Basophils Relative: 0 %
Eosinophils Absolute: 0 10*3/uL (ref 0.0–0.5)
Eosinophils Relative: 0 %
HCT: 39.6 % (ref 36.0–46.0)
Hemoglobin: 12.7 g/dL (ref 12.0–15.0)
Immature Granulocytes: 0 %
Lymphocytes Relative: 20 %
Lymphs Abs: 1 10*3/uL (ref 0.7–4.0)
MCH: 27.9 pg (ref 26.0–34.0)
MCHC: 32.1 g/dL (ref 30.0–36.0)
MCV: 87 fL (ref 80.0–100.0)
Monocytes Absolute: 0.4 10*3/uL (ref 0.1–1.0)
Monocytes Relative: 8 %
Neutro Abs: 3.6 10*3/uL (ref 1.7–7.7)
Neutrophils Relative %: 72 %
Platelets: 71 10*3/uL — ABNORMAL LOW (ref 150–400)
RBC: 4.55 MIL/uL (ref 3.87–5.11)
RDW: 13 % (ref 11.5–15.5)
WBC: 5.1 10*3/uL (ref 4.0–10.5)
nRBC: 0 % (ref 0.0–0.2)

## 2021-06-30 MED ORDER — IOHEXOL 9 MG/ML PO SOLN
500.0000 mL | ORAL | Status: AC
  Administered 2021-06-30 (×2): 500 mL via ORAL
  Filled 2021-06-30 (×2): qty 500

## 2021-06-30 MED ORDER — IOHEXOL 350 MG/ML SOLN
80.0000 mL | Freq: Once | INTRAVENOUS | Status: AC | PRN
  Administered 2021-06-30: 15:00:00 80 mL via INTRAVENOUS

## 2021-06-30 MED ORDER — SODIUM CHLORIDE (PF) 0.9 % IJ SOLN
INTRAMUSCULAR | Status: AC
  Filled 2021-06-30: qty 50

## 2021-06-30 NOTE — Progress Notes (Signed)
°   06/30/21 0500  Sleep  Number of Hours 10

## 2021-06-30 NOTE — Progress Notes (Signed)
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  Chart Review of Past 24 hrs: The patient's chart was reviewed and nursing notes were reviewed. The patient's case was discussed in multidisciplinary team meeting.  Per MAR: - Patient will have scheduled meds today. - PRNs: Tylenol x 1 Per RN notes, patient is irritable and hyper religious, loudly praying and declaring that she has no need to be here, but she is attending some groups. Patient sleep hours not documented  Patient had the following psychiatric recommendations yesterday:  -With guardian's permission, initiate Haldol 5 mg twice daily p.o. or IM and Ativan 0.5 mg twice daily p.o. or IM with forced medication over objection protocol in place - Continue as needed agitation protocol of Haldol 5 mg every 8 hours p.o. or IM, with/ or Ativan 1 mg every 8 hours p.o. or IM and Benadryl $RemoveBefo'50mg'UFzRTgaydJP$  po or IM every 6 hours PRN agitation - Continue Cogentin 0.5 mg twice daily PRN   On Today's Assessment (06/30/2021): Case was discussed in the multidisciplinary team. MAR was reviewed and patient was compliant with medications. Patient seen, assessed, and discussed with attending Dr. Berdine Addison.  Patient is receiving morning medications as our team enters the room, yelling that she is "a child of [name] " and does not need medications.  She calms as this Probation officer enters the room, stating that I am "just who she needed to see."  She reports that her mood is "okay" today, she slept well, and her appetite is intact.  She denies SI/HI today, but continues to voice delusions of being a  "child of [name] who does not ascribe to the law; y'all should submit to me" .  She denies ideas of reference but reports residual AVH, hearing and seeing things both within the spiritual realm and the demonic realm.  She is advised that per the oncologist, she will receive CT CAP, as well as CT head, and is agreeable.  Today, she denies physical issues.  Although she continues to insist that she does not need medications and does not need to be admitted, she is agreeable to having Comprehensive Care.  Principal Problem: Schizophrenia spectrum disorder with psychotic disorder type not yet determined ([place]) Diagnosis: Principal Problem:   Schizophrenia spectrum disorder with psychotic disorder type not yet determined ([place]) Active Problems:   Brain metastasis ([place])   Melanoma of skin ([place])   Leukopenia   Thrombocytopenia ([place])  Total Time spent with patient: I personally spent 35 minutes on the unit in direct patient care. The direct patient care time included face-to-face time with the patient, reviewing the patient's chart, communicating with other professionals, and coordinating care. Greater than 50% of this time was spent in counseling or coordinating care with the patient regarding goals of hospitalization, psycho-education, and discharge planning needs.   Review of Records Summary: "History was obtained from review of her medical record.  It appears she has a history of melanoma of unknown primary diagnosed in July 2019 and was found to have stage IV disease including CNS, peritoneal and subcutaneous nodules.  She appears to have had whole brain radiation in August 2019 and had [facility]  and [name] treatments discontinued in August 2019 due to partial response and 12 cycles of Pembrolizumab starting in December 2019 and completed in July 2020.  Per her January 2022 office visit with oncology, at that time she was felt to have achieved a near complete response to a combination of oral targeted therapy  and immunotherapy and at that time was on active surveillance without evidence of relapse of the disease.  Her records indicate that by April 2022 she was in the emergency department for evaluation of psychosis.  Her records at that time indicate that she had a previous history of bipolar and psychosis predating her metastatic melanoma.  It appears she has had multiple ED visits for psychosis since April 2022 and did have an MRI of the brain last done in April of 2022 which showed stable findings that were compatible with treated lesions and no new areas of enhancement or hemorrhage to suggest active disease or disease progression.  In reviewing her previous medication trials it appears that the patient has been on Haldol, Zyprexa, Cogentin, Tegretol, Seroquel, IM Geodon, and Ativan in the past. "  Past Medical History:  Past Medical History:  Diagnosis Date   Allergy    Anemia    Anemia    Diabetes mellitus without complication ([place])    prediabetic   GERD (gastroesophageal reflux disease)    melanoma with met dz dx'd 01/2018   brain and adrenal   Seasonal allergies     Past Surgical History:  Procedure Laterality Date   BIOPSY  01/28/2018   Procedure: BIOPSY;  Surgeon: Laurence Spates, MD;  Location: [facility];  Service: Endoscopy;;   BREAST BIOPSY Left    ESOPHAGOGASTRODUODENOSCOPY N/A 01/28/2018   Procedure: ESOPHAGOGASTRODUODENOSCOPY (EGD);  Surgeon: Laurence Spates, MD;  Location: [facility];  Service: Endoscopy;  Laterality: N/A;   MOUTH SURGERY     OVARY SURGERY     removed "something"   Family History:  Family History  Problem Relation Age of Onset   Melanoma Mother    Hypertension Father    Breast cancer Maternal Aunt    Breast cancer Paternal Aunt    Diabetes Paternal Aunt    Breast cancer Maternal Grandmother    Breast cancer Paternal Grandmother    Diabetes Paternal Grandmother    Colon cancer Neg Hx    Esophageal cancer Neg Hx    Pancreatic cancer Neg Hx     Stomach cancer Neg Hx    Liver disease Neg Hx    Rectal cancer Neg Hx    Family Psychiatric  History: None reported, as patient becomes irritable discussing how her family members have IVC'd her in the past few months  Social History:  Social History   Substance and Sexual Activity  Alcohol Use No     Social History   Substance and Sexual Activity  Drug Use No    Social History   Socioeconomic History   Marital status: Single    Spouse name: Not on file   Number of children: Not on file   Years of education: Not on file   Highest education level: Not on file  Occupational History   Not on file  Tobacco Use   Smoking status: Never   Smokeless tobacco: Never  Vaping Use   Vaping Use: Never used  Substance and Sexual Activity   Alcohol use: No   Drug use: No   Sexual activity: Not Currently  Other Topics Concern   Not on file  Social  History Narrative   Not on file   Social Determinants of Health   Financial Resource Strain: Not on file  Food Insecurity: Not on file  Transportation Needs: Not on file  Physical Activity: Not on file  Stress: Not on file  Social Connections: Not on file   Additional Social History:  Living at area hotels;has a guardian with DSS  Sleep: Good per patient report  Appetite:  Good  Current Medications: Current Facility-Administered Medications  Medication Dose Route Frequency Provider Last Rate Last Admin   acetaminophen (TYLENOL) tablet 650 mg  650 mg Oral Q6H PRN Rosezetta Schlatter, MD   650 mg at 06/28/21 0957   benztropine (COGENTIN) tablet 0.5 mg  0.5 mg Oral BID PRN Rosezetta Schlatter, MD       diphenhydrAMINE (BENADRYL) capsule 50 mg  50 mg Oral Q6H PRN Freida Busman, MD       Or   diphenhydrAMINE (BENADRYL) injection 50 mg  50 mg Intramuscular Q6H PRN Damita Dunnings B, MD       haloperidol (HALDOL) tablet 5 mg  5 mg Oral BID Nelda Marseille, Amy E, MD   5 mg at 06/29/21 1601   Or   haloperidol lactate (HALDOL) injection 5 mg  5  mg Intramuscular BID Nelda Marseille, Amy E, MD   5 mg at 06/29/21 1000   haloperidol (HALDOL) tablet 5 mg  5 mg Oral Q8H PRN Harlow Asa, MD       Or   haloperidol lactate (HALDOL) injection 5 mg  5 mg Intramuscular Q8H PRN Nelda Marseille, Amy E, MD       LORazepam (ATIVAN) tablet 0.5 mg  0.5 mg Oral BID Nelda Marseille, Amy E, MD   0.5 mg at 06/29/21 1558   Or   LORazepam (ATIVAN) injection 0.5 mg  0.5 mg Intramuscular BID Nelda Marseille, Amy E, MD       LORazepam (ATIVAN) tablet 1 mg  1 mg Oral Q8H PRN Viann Fish E, MD       Or   LORazepam (ATIVAN) injection 1 mg  1 mg Intramuscular Q8H PRN Harlow Asa, MD        Lab Results:    Blood Alcohol level:  Lab Results  Component Value Date   [facility] <10 06/25/2021   ETH <10 74/73/4037    Metabolic Disorder Labs: Lab Results  Component Value Date   HGBA1C 5.1 06/28/2021   MPG 99.67 06/28/2021   MPG 119.76 02/03/2018   No results found for: PROLACTIN Lab Results  Component Value Date   CHOL 212 (H) 06/28/2021   TRIG 56 06/28/2021   HDL 63 06/28/2021   CHOLHDL 3.4 06/28/2021   VLDL 11 06/28/2021   LDLCALC 138 (H) 06/28/2021   [place] 91 02/03/2018    Physical Findings:  Musculoskeletal: Strength & Muscle Tone: within normal limits Gait & Station: normal Patient leans: N/A  Psychiatric Specialty Exam:  Presentation  General Appearance: Appropriate for Environment; Casual; Fairly Groomed  Eye Contact:Good  Speech:Clear and Coherent; Normal Rate  Speech Volume: Becomes loud when she is agitated  Handedness:Right   Mood and Affect  Mood:Irritable initially, but "okay"  Affect:Irritable and argumentative, but cooperative with this Company secretary Processes: More linear today but concrete and at times avoidant; ruminative about discharge planning  Orientation: Knows month, year, city   Thought Content: Has hyper-religious thinking and endorses delusion and ideas of influence that she is empowered by  [name] to have others submit to her; has  persecutory delusions about demonic people and continues to express paranoid thoughts about people; endorses AVH but denies SI, HI.  History of Schizophrenia/Schizoaffective disorder:Yes (Patient reported diagnosis of schizophrenia at [facility] in April 2022)  Duration of Psychotic Symptoms:Greater than six months  Hallucinations:No data recorded  Ideas of Reference:Denied but has ideas of influence  Suicidal Thoughts: Denies  Homicidal Thoughts: Denies   Sensorium  Memory:Immediate Fair; Recent Fair  Judgment:Impaired  Insight:Poor  Executive Functions  Concentration:Fair  Attention Span:Fair  [place]   Psychomotor Activity  Psychomotor Activity:Agitated at start of interview - has normal psychomotor activity at conclusion of interview   Assets  Assets:Resilience, has guardian, housing   Sleep  Sleep:Number of Hours of Sleep: 10   Physical Exam Vitals and nursing note reviewed.  Constitutional:      General: She is not in acute distress.    Appearance: Normal appearance. She is ill-appearing.     Comments: Chronically ill-appearing  HENT:     Head: Normocephalic and atraumatic.     Mouth/Throat:     Comments: With sore to right sided upper lip; healing appropriately Pulmonary:     Effort: Pulmonary effort is normal.  Neurological:     General: No focal deficit present.     Mental Status: She is alert and oriented to person, place, and time.     Motor: No weakness.     Gait: Gait normal.   Review of Systems  Gastrointestinal:  Positive for abdominal pain. Negative for constipation, diarrhea, nausea and vomiting.       Reported lower abdominal pain today  Genitourinary: Negative.   Musculoskeletal:  Positive for back pain.       Reported lower back pain  Neurological:  Positive for headaches.  Blood pressure (!) 151/88, pulse 73, temperature 97.7 F (36.5 C),  temperature source Oral, resp. rate 16, height $RemoveBe'5\' 1"'VnsDPygTx$  (1.549 m), weight 56 kg, SpO2 98 %. Body mass index is 23.32 kg/m.   Treatment Plan Summary: ASSESSMENT: Principal Problem:   Schizophrenia spectrum disorder with psychotic disorder type not yet determined ([place]) Active Problems:   Brain metastasis (H[facility])   Melanoma of skin (H[facility])   Leukopenia   Thrombocytopenia ([facility])     The patient is a 50 year old female with a reported history of bipolar disorder and unspecified psychosis, and a medical history of melanoma with metastases to the brain, admitted under IVC for bizarre behaviors, agitation, delusions, and hyperreligiosity.  On assessment, patient remains irritable and hyperreligious, with residual delusions, report of AVH, and paranoia on exam.    Treatment Plan Summary: Daily contact with patient to assess and evaluate symptoms and progress in treatment and Medication management    Safety and Monitoring: INVOLUTARILY (by [employer]) admission to inpatient psychiatric unit for safety, stabilization and treatment Daily contact with patient to assess and evaluate symptoms and progress in treatment Patient's case to be discussed in multi-disciplinary team meeting Observation Level : q15 minute checks Vital signs: q12 hours Precautions: suicide, elopement, and assault   2. Psychiatric Problems #Unspecified schizophrenia spectrum and other psychotic d/o (r/o psychosis secondary to general medical condition - metatstatic melanoma vs bipolar d/o MRE manic with psychotic features) Notes from previous [place] visit report some baseline delusions, hyperreligiosity in 01/2021, with problems ongoing since 09/2020, per patient's reporting.  -Continue Haldol 5 mg twice daily p.o. or IM and Ativan 0.5 mg twice daily p.o. or IM with forced medication over objection protocol in place - Continue as  needed agitation protocol of Haldol 5 mg every 8 hours p.o. or IM, with/ or Ativan 1 mg every 8 hours p.o. or IM and  Benadryl $RemoveB'50mg'lCrIVzfV$  po or IM every 6 hours PRN agitation - Continue Cogentin 0.5 mg twice daily PRN - will Check AIMS as she will cooperate for testing   3. Medical Management Covid negative CMP: Calcium 8.4 CBC: WBC 3.9, H/H 11.7/37.1, platelets 60, ANC 2700 EtOH: <10 UDS: Negative TSH: 2.774 A1C: 5.1 Lipids: Cholesterol 212, LDL 138 QTC 463ms   #History of metastatic melanoma Metastases to the brain and adrenals first reported in 2019, and patient had full brain irradiation around that time.  Patient was with regular follow-up until April of this year, and was lost to follow-up for her July appointment and beyond. - Reached out to oncology: Per Dr. Lindi Adie- Verify with guardian whether she wants to go through treatment. If she wants to go through treatment, she will need a Whole body scan and to follow up in clinic outpatient; if not, consider hospice. - Per Dr. Lindi Adie, obtain CT CAP with contrast and CT head with and without contrast today for surveillance; once results obtained, we will discuss with the patient and guardian   #Thrombocytopenia #Leukopenia Platelets 60; ANC 2700 and WBC 3.9 Appears chronic (platelets down to 57 5 months ago)- will monitor with serial CBCs -Repeat CBC was ordered for p.m. labs yesterday; will be obtained this evening  4. Discharge Planning:              -- Social work and case management to assist with discharge planning and identification of hospital follow-up needs prior to discharge.              -- Estimated LOS: 7-10 days, pending clinical improvement             -- Discharge Concerns: Need to establish a safety plan; Medication compliance and effectiveness             -- Discharge Goals: Return to hotel with outpatient referrals for mental health follow-up including medication management/psychotherapy   Rosezetta Schlatter, MD 06/30/2021, 7:29 AM

## 2021-06-30 NOTE — Plan of Care (Signed)
Nurse discussed coping skills with patient.  

## 2021-06-30 NOTE — Progress Notes (Addendum)
D:  Patient has been in bed most of the day.  Patient denied SI and HI, contracts for safety.  Denied A/V hallucinations.  A:  Medications refused several times by the patient.  Would take p.o. meds when injections were drawn up and shown to patient. R:  Safety maintained with 15 minute checks.  Patient has been irritable and called staff names.

## 2021-06-30 NOTE — Progress Notes (Signed)
Pt stayed in her room much of the evening, pt continues to be agitated    06/30/21 2000  Psych Admission Type (Psych Patients Only)  Admission Status Involuntary  Psychosocial Assessment  Patient Complaints Irritability  Eye Contact Intense  Facial Expression Angry;Anxious;Worried  Affect Angry;Anxious;Apprehensive;Preoccupied  Speech Argumentative;Pressured  Interaction Defensive;Demanding;Hostile  Motor Activity Slow  Appearance/Hygiene Disheveled  Behavior Characteristics Anxious;Agitated  Mood Labile  Thought Process  Coherency Concrete thinking  Content Blaming others;Religiosity;Paranoia  Delusions Religious  Perception Derealization  Hallucination None reported or observed  Judgment Impaired  Confusion Mild  Danger to Self  Current suicidal ideation? Denies  Danger to Others  Danger to Others None reported or observed

## 2021-06-30 NOTE — Progress Notes (Signed)
Patient refused to take her P.O. pills this morning.  Nurse/MHT encouraged patient many items to take medications instead of injections.  MDs informed.  Nurse prepared injections and then patient stated she would take the pills.  Patient drank the first bottle of contrast by 12:30.   Will give the second bottle of contrast to patient at 1300 to drink.

## 2021-06-30 NOTE — Progress Notes (Signed)
Adult Psychoeducational Group Note  Date:  06/30/2021 Time:  9:28 PM  Group Topic/Focus:  Wrap-Up Group:   The focus of this group is to help patients review their daily goal of treatment and discuss progress on daily workbooks.  Participation Level:  Did Not Attend  Participation Quality:   Did Not Attend  Affect:   Did Not Attend  Cognitive:   Did Not Attend  Insight: None  Engagement in Group:   Did Not Attend  Modes of Intervention:   Did Not Attend  Additional Comments:  Pt did not attend evening wrap up group tonight.  [name] 06/30/2021, 9:28 PM

## 2021-06-30 NOTE — Group Note (Signed)
Recreation Therapy Group Note   Group Topic:Team Building  Group Date: 06/30/2021 Start Time: 6808 End Time: 1028 Facilitators: Victorino Sparrow, LRT,CTRS Location: [address] Dayroom   Goal Area(s) Addresses:  Patient will effectively work with peer towards shared goal.  Patient will identify skills used to make activity successful.  Patient will identify how skills used during activity can be used to reach post d/c goals.   Group Description: Landing Pad. In teams of 3-5, patients were given 12 plastic drinking straws and an equal length of masking tape. Using the materials provided, patients were asked to build a landing pad to catch a golf ball dropped from approximately 5 feet in the air. All materials were required to be used by the team in their design. LRT facilitated post-activity discussion.   Affect/Mood: N/A   Participation Level: Did not attend    Clinical Observations/Individualized Feedback:     Plan: Continue to engage patient in RT group sessions 2-3x/week.   Victorino Sparrow, LRT,CTRS 06/30/2021 11:57 AM

## 2021-06-30 NOTE — Progress Notes (Signed)
Patient just finished her second bottle of contrast.  In process of setting up transport for patient to [facility] for tests.  Patient had no problems drinking contrast.

## 2021-07-01 DIAGNOSIS — F29 Unspecified psychosis not due to a substance or known physiological condition: Secondary | ICD-10-CM | POA: Diagnosis not present

## 2021-07-01 NOTE — Progress Notes (Signed)
Pt isolative in bed majority of this shift. Denies SI, HI, AVH and pain when assessed. However, pt remains oppositional to care, refusing medications via PO route on multiple encounters, requiring IM medications this shift. Remains hyper-religious "I'm not taking your medicine, Jesus Jovita Gamma is my brother, he's my Lord and Ambulance person. [name] is the ultimate healer. I don't need that". Pt also stated that "That Haldol is making me delusional too". Ate approximately 50% of lunch and 25% of dinner (sandwich). Emotional support and encouragement offered to pt. Safety checks maintained at Q 15 minutes intervals without self harm gestures. All medications administered with verbal education and effects monitored.  Pt awake in bed. Did not attend groups as scheduled.

## 2021-07-01 NOTE — Progress Notes (Signed)
Pt spent majority of the evening in  07/01/21 2000  Psych Admission Type (Psych Patients Only)  Admission Status Involuntary  Psychosocial Assessment  Patient Complaints Irritability  Eye Contact Intense;Suspiciousness  Facial Expression Angry;Anxious;Worried  Affect Angry;Anxious;Apprehensive;Preoccupied  Speech Argumentative;Pressured  Interaction Defensive;Demanding;Hostile  Motor Activity Slow  Appearance/Hygiene Disheveled  Behavior Characteristics Anxious  Mood Suspicious  Aggressive Behavior  Effect No apparent injury  Thought Process  Coherency Concrete thinking  Content Blaming others;Religiosity;Paranoia  Delusions Religious  Perception Derealization  Hallucination None reported or observed  Judgment Impaired  Confusion Mild  Danger to Self  Current suicidal ideation? Denies  Danger to Others  Danger to Others None reported or observed    her room

## 2021-07-01 NOTE — Group Note (Signed)
Recreation Therapy Group Note   Group Topic:Health and Wellness  Group Date: 07/01/2021 Start Time: 1000 End Time: 1035 Facilitators: Victorino Sparrow, LRT,CTRS Location: [address] Dayroom   Goal Area(s) Addresses:  Patient will define components of whole wellness. Patient will verbalize benefit of whole wellness.   Group Description: Exercise.  LRT led group in a series of stretches to loosen them up.  Patients then led the group one by one in an exercise of their choosing.  The group was going for at least 30 minutes of solid exercise.  Patients were encouraged to get water and take breaks as needed.   Affect/Mood: N/A   Participation Level: Did not attend    Clinical Observations/Individualized Feedback:     Plan: Continue to engage patient in RT group sessions 2-3x/week.   Victorino Sparrow, LRT,CTRS 07/01/2021 12:19 PM

## 2021-07-01 NOTE — Progress Notes (Signed)
°   07/01/21 0500  Sleep  Number of Hours 9.75

## 2021-07-01 NOTE — Progress Notes (Signed)
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  Chart Review of Past 24 hrs: The patient's chart was reviewed and nursing notes were reviewed. The patient's case was discussed in multidisciplinary team meeting.  Per MAR: - Patient will have scheduled meds today. - PRNs: None Per RN notes, patient is irritable and hyper religious, loudly praying and declaring that she has no need to be here, but she is attending some groups. Patient slept 9.75 hours  Patient had the following psychiatric recommendations yesterday:  -With guardian's permission, initiate Haldol 5 mg twice daily p.o. or IM and Ativan 0.5 mg twice daily p.o. or IM with forced medication over objection protocol in place - Continue as needed agitation protocol of Haldol 5 mg every 8 hours p.o. or IM, with/ or Ativan 1 mg every 8 hours p.o. or IM and Benadryl $RemoveBefo'50mg'uZybAWhPtit$  po or IM every 6 hours PRN agitation - Continue Cogentin 0.5 mg twice daily PRN   On Today's Assessment (07/01/2021): Case was discussed in the multidisciplinary team. MAR was reviewed and patient was compliant with medications. Patient seen, assessed, and discussed with attending Dr. Berdine Addison.  Patient is receiving morning medications as this writer enters the room, yelling that she is being "raped, forced to take medications as they stick the needle inside of me."  She is difficult to calm and advised this writer to come back later to assess her. This writer attempted to assess her a second time, and she advised that it was too soon.  On attending reassessment in the afternoon, patient  pretended to be asleep, and would not cooperate with assessment.  Principal Problem: Schizophrenia spectrum disorder with psychotic disorder type not yet determined ([place]) Diagnosis: Principal Problem:   Schizophrenia spectrum disorder with psychotic disorder type not yet determined ([place]) Active Problems:   Brain metastasis ([place])   Melanoma of skin ([place])   Leukopenia   Thrombocytopenia ([place])  Total Time spent with patient: I personally spent 35 minutes on the unit in direct patient care. The direct patient care time included face-to-face time with the patient, reviewing the patient's chart, communicating with other professionals, and coordinating care. Greater than 50% of this time was spent in counseling or coordinating care with the patient regarding goals of hospitalization, psycho-education, and discharge planning needs.   Review of Records Summary: "History was obtained from review of her medical record.  It appears she has a history of melanoma of unknown primary diagnosed in July 2019 and was found to have stage IV disease including CNS, peritoneal and subcutaneous nodules.  She appears to have had whole brain radiation in August 2019 and had [place] and [name] treatments discontinued in August 2019 due to partial response and 12 cycles of Pembrolizumab starting in December 2019 and completed in July 2020.  Per her January 2022 office visit with oncology, at that time she was felt to have achieved a near complete response to a combination of oral targeted therapy and immunotherapy and at that time was on active surveillance without evidence of relapse of the disease.  Her records indicate that by April 2022 she was in the emergency department for evaluation of  psychosis.  Her records at that time indicate that she had a previous history of bipolar and psychosis predating her metastatic melanoma.  It appears she has had multiple ED visits for psychosis since April 2022 and did have an MRI of the  brain last done in April of 2022 which showed stable findings that were compatible with treated lesions and no new areas of enhancement or hemorrhage to suggest active disease or disease progression.  In reviewing her previous medication trials it appears that the patient has been on Haldol, Zyprexa, Cogentin, Tegretol, Seroquel, IM Geodon, and Ativan in the past. "  Past Medical History:  Past Medical History:  Diagnosis Date   Allergy    Anemia    Anemia    Diabetes mellitus without complication ([place])    prediabetic   GERD (gastroesophageal reflux disease)    melanoma with met dz dx'd 01/2018   brain and adrenal   Seasonal allergies     Past Surgical History:  Procedure Laterality Date   BIOPSY  01/28/2018   Procedure: BIOPSY;  Surgeon: Laurence Spates, MD;  Location: [facility];  Service: Endoscopy;;   BREAST BIOPSY Left    ESOPHAGOGASTRODUODENOSCOPY N/A 01/28/2018   Procedure: ESOPHAGOGASTRODUODENOSCOPY (EGD);  Surgeon: Laurence Spates, MD;  Location: [facility];  Service: Endoscopy;  Laterality: N/A;   MOUTH SURGERY     OVARY SURGERY     removed "something"   Family History:  Family History  Problem Relation Age of Onset   Melanoma Mother    Hypertension Father    Breast cancer Maternal Aunt    Breast cancer Paternal Aunt    Diabetes Paternal Aunt    Breast cancer Maternal Grandmother    Breast cancer Paternal Grandmother    Diabetes Paternal Grandmother    Colon cancer Neg Hx    Esophageal cancer Neg Hx    Pancreatic cancer Neg Hx    Stomach cancer Neg Hx    Liver disease Neg Hx    Rectal cancer Neg Hx    Family Psychiatric  History: None reported, as patient becomes irritable discussing how her family members have IVC'd her in the past few months  Social History:  Social History   Substance and Sexual Activity  Alcohol Use No     Social History   Substance and Sexual Activity  Drug Use No    Social History   Socioeconomic History   Marital status:  Single    Spouse name: Not on file   Number of children: Not on file   Years of education: Not on file   Highest education level: Not on file  Occupational History   Not on file  Tobacco Use   Smoking status: Never   Smokeless tobacco: Never  Vaping Use   Vaping Use: Never used  Substance and Sexual Activity   Alcohol use: No   Drug use: No   Sexual activity: Not Currently  Other Topics Concern   Not on file  Social History Narrative   Not on file   Social Determinants of Health   Financial Resource Strain: Not on file  Food Insecurity: Not on file  Transportation Needs: Not on file  Physical Activity: Not on file  Stress: Not on file  Social Connections: Not on file   Additional Social History:  Living at area hotels;has a guardian with DSS  Sleep: Good per patient report  Appetite:  Good  Current Medications: Current Facility-Administered Medications  Medication Dose Route Frequency Provider Last Rate  Last Admin   acetaminophen (TYLENOL) tablet 650 mg  650 mg Oral Q6H PRN Rosezetta Schlatter, MD   650 mg at 06/28/21 0957   benztropine (COGENTIN) tablet 0.5 mg  0.5 mg Oral BID PRN Rosezetta Schlatter, MD       diphenhydrAMINE (BENADRYL) capsule 50 mg  50 mg Oral Q6H PRN Freida Busman, MD       Or   diphenhydrAMINE (BENADRYL) injection 50 mg  50 mg Intramuscular Q6H PRN Damita Dunnings B, MD       haloperidol (HALDOL) tablet 5 mg  5 mg Oral BID Nelda Marseille, Amy E, MD   5 mg at 06/30/21 1735   Or   haloperidol lactate (HALDOL) injection 5 mg  5 mg Intramuscular BID Viann Fish E, MD   5 mg at 06/29/21 1000   haloperidol (HALDOL) tablet 5 mg  5 mg Oral Q8H PRN Harlow Asa, MD       Or   haloperidol lactate (HALDOL) injection 5 mg  5 mg Intramuscular Q8H PRN Nelda Marseille, Amy E, MD       LORazepam (ATIVAN) tablet 0.5 mg  0.5 mg Oral BID Nelda Marseille, Amy E, MD   0.5 mg at 06/30/21 1737   Or   LORazepam (ATIVAN) injection 0.5 mg  0.5 mg Intramuscular BID Nelda Marseille, Amy E, MD        LORazepam (ATIVAN) tablet 1 mg  1 mg Oral Q8H PRN Harlow Asa, MD       Or   LORazepam (ATIVAN) injection 1 mg  1 mg Intramuscular Q8H PRN Harlow Asa, MD        Lab Results:    Blood Alcohol level:  Lab Results  Component Value Date   [facility] <10 06/25/2021   ETH <10 46/50/3546    Metabolic Disorder Labs: Lab Results  Component Value Date   HGBA1C 5.1 06/28/2021   MPG 99.67 06/28/2021   MPG 119.76 02/03/2018   No results found for: PROLACTIN Lab Results  Component Value Date   CHOL 212 (H) 06/28/2021   TRIG 56 06/28/2021   HDL 63 06/28/2021   CHOLHDL 3.4 06/28/2021   VLDL 11 06/28/2021   LDLCALC 138 (H) 06/28/2021   [place]kins 91 02/03/2018   Imaging: EXAM: CT CHEST, ABDOMEN, AND PELVIS WITH CONTRAST   TECHNIQUE: Multidetector CT imaging of the chest, abdomen and pelvis was performed following the standard protocol during bolus administration of intravenous contrast.   CONTRAST:  63mL OMNIPAQUE IOHEXOL 350 MG/ML SOLN   COMPARISON:  Multiple prior exams, most recent CT chest, abdomen and pelvis dated July 16, 2020   FINDINGS: CT CHEST FINDINGS   Cardiovascular: Normal heart size. No pericardial effusion. No significant coronary artery calcifications. No atherosclerotic disease of the thoracic aorta.   Mediastinum/Nodes: No pathologically enlarged lymph nodes seen in the chest. Normal esophagus.   Lungs/Pleura: Central airways are patent. Lucency of the left lower lobe, likely focal emphysema. No consolidation, pleural effusion or pneumothorax. Calcified pulmonary nodules of the left lower lobe. No suspicious pulmonary nodules.   Musculoskeletal: No chest wall mass or suspicious bone lesions identified.   CT ABDOMEN PELVIS FINDINGS   Hepatobiliary: Unchanged enhancing lesion of the caudate lobe of the liver, measuring up to 9 mm on series 3, image 50. No new liver lesions. Soft tissue lesion of the gallbladder is increased in size when  compared with prior exam, measuring to 2.5 x 1.3 cm, previously measured up to 6 mm on January 08, 2020 prior. No  gallbladder wall thickening or biliary ductal dilation.   Pancreas: Unremarkable. No pancreatic ductal dilatation or surrounding inflammatory changes.   Spleen: Unchanged enhancing lesions of the spleen.   Adrenals/Urinary Tract: Bilateral adrenal gland nodules slightly decreased in size when compared with prior exam and remeasured in similar plane. Measures 1.5 x 1.1 cm on the right, previously measured 1.8 x 1.3 cm. Measures 1.4 x 0.9 cm on the left, previously 1.6 x 1.3 cm. Kidneys enhance symmetrically with no evidence of hydronephrosis or nephrolithiasis. Bladder is unremarkable.   Stomach/Bowel: Stomach is within normal limits. Appendix appears normal. No evidence of bowel wall thickening, distention, or inflammatory changes.   Vascular/Lymphatic: Aortic atherosclerosis. No enlarged abdominal or pelvic lymph nodes.   Reproductive: Fibroid uterus. Endometrial thickening, which is increased compared with prior exam.   Other: No abdominal wall hernia or abnormality. No abdominopelvic ascites.   Musculoskeletal: Stable sclerotic lesion of the right sacrum. No aggressive appearing osseous lesions   IMPRESSION: 1. Bilateral adrenal nodules are decreased in size when compared with most recent prior exam. 2. Soft tissue lesion of the gallbladder increased in size when compared with prior exams. 3. Unchanged indeterminate hyperenhancing lesions of the caudate lobe of the liver and spleen. 4. Endometrial thickening, which is increased compared with prior exam. Recommend further evaluation with pelvic ultrasound.     Electronically Signed   By: Yetta Glassman M.D.   On: 06/30/2021 16:34  EXAM: CT HEAD WITHOUT AND WITH CONTRAST   TECHNIQUE: Contiguous axial images were obtained from the base of the skull through the vertex without and with intravenous  contrast   CONTRAST:  55mL OMNIPAQUE IOHEXOL 350 MG/ML SOLN   COMPARISON:  06/21/2021 CT head without contrast, correlation is also made with 10/04/2020 MRI head with and without contrast   FINDINGS: Brain: No acute infarct, hemorrhage, mass effect, or midline shift. No definite abnormal enhancement or mass lesion is seen. No enhancement is seen to correlate with the previously noted enhancement in the medial right parietal lobe on the 10/04/2020 MRI. No hydrocephalus or extra-axial collection.   Vascular: No hyperdense vessel or unexpected calcification. Visible vessels are patent.   Skull: Normal. Negative for fracture or focal lesion.   Sinuses/Orbits: No acute finding.   Other: Trace fluid in left mastoid air cells.   IMPRESSION: No definite enhancing lesion or other acute intracranial process. If there is persistent clinical concern for metastatic disease, an MRI with and without contrast is recommended.     Electronically Signed   By: Merilyn Baba M.D.   On: 06/30/2021 15:45  Physical Findings:  Musculoskeletal: Strength & Muscle Tone: within normal limits Gait & Station: normal Patient leans: N/A  Psychiatric Specialty Exam:  Presentation  General Appearance: Appropriate for Environment; Casual; Fairly Groomed  Eye Contact:Good  Speech:Clear and Coherent; Normal Rate  Speech Volume: Increased, as she is agitated  Handedness:Right   Mood and Affect  Mood:Irritable  Affect:Irritable and argumentative, difficult to calm   Thought Process  Thought Processes: Unable to assess today  Orientation: Unable to assess today  Thought Content: Unable to assess today  History of Schizophrenia/Schizoaffective disorder:Yes (Patient reported diagnosis of schizophrenia at [facility] in April 2022)  Duration of Psychotic Symptoms:Greater than six months  Hallucinations:Unable to assess today  Ideas of Reference:Unable to assess today  Suicidal Thoughts:  Unable to assess today  Homicidal Thoughts: Unable to assess today   Sensorium  Memory:Immediate Fair; Recent Fair  Judgment:Impaired  Insight:Poor  Executive Functions  Concentration:Fair  Attention Span:Fair  Kaitlyn Duncan   Psychomotor Activity  Psychomotor Activity:Agitated throughout interview    Assets  Assets:Resilience, has guardian, housing   Sleep  Sleep:Number of Hours of Sleep: 9.75   Physical Exam Vitals and nursing note reviewed.  Constitutional:      General: She is not in acute distress.    Appearance: Normal appearance. She is ill-appearing.     Comments: Chronically ill-appearing  HENT:     Head: Normocephalic and atraumatic.     Mouth/Throat:     Comments: With sore to right sided upper lip; healing appropriately Pulmonary:     Effort: Pulmonary effort is normal.  Neurological:     General: No focal deficit present.     Mental Status: She is alert and oriented to person, place, and time.     Motor: No weakness.     Gait: Gait normal.   Review of Systems  Gastrointestinal:  Positive for abdominal pain. Negative for constipation, diarrhea, nausea and vomiting.       Reported lower abdominal pain today  Genitourinary: Negative.   Musculoskeletal:  Positive for back pain.       Reported lower back pain  Neurological:  Positive for headaches.  Blood pressure (!) 116/96, pulse 88, temperature 97.8 F (36.6 C), temperature source Oral, resp. rate 16, height $RemoveBe'5\' 1"'DiSCetIfn$  (1.549 m), weight 56 kg, SpO2 99 %. Body mass index is 23.32 kg/m.   Treatment Plan Summary: ASSESSMENT: Principal Problem:   Schizophrenia spectrum disorder with psychotic disorder type not yet determined ([place]) Active Problems:   Brain metastasis ([facility])   Melanoma of skin ([facility])   Leukopenia   Thrombocytopenia ([facility])     The patient is a 50 year old female with a reported history of bipolar disorder and unspecified psychosis, and a  medical history of melanoma with metastases to the brain, admitted under IVC for bizarre behaviors, agitation, delusions, and hyperreligiosity.  On assessment, patient remains irritable and hyperreligious, with residual delusions, report of AVH, and paranoia on exam.    Treatment Plan Summary: Daily contact with patient to assess and evaluate symptoms and progress in treatment and Medication management    Safety and Monitoring: INVOLUTARILY (by [employer]) admission to inpatient psychiatric unit for safety, stabilization and treatment Daily contact with patient to assess and evaluate symptoms and progress in treatment Patient's case to be discussed in multi-disciplinary team meeting Observation Level : q15 minute checks Vital signs: q12 hours Precautions: suicide, elopement, and assault   2. Psychiatric Problems #Unspecified schizophrenia spectrum and other psychotic d/o (r/o psychosis secondary to general medical condition - metatstatic melanoma vs bipolar d/o MRE manic with psychotic features) Notes from previous [facility] visit report some baseline delusions, hyperreligiosity in 01/2021, with problems ongoing since 09/2020, per patient's reporting.  -Continue Haldol 5 mg twice daily p.o. or IM and Ativan 0.5 mg twice daily p.o. or IM with forced medication over objection protocol in place - Continue as needed agitation protocol of Haldol 5 mg every 8 hours p.o. or IM, with/ or Ativan 1 mg every 8 hours p.o. or IM and Benadryl $RemoveBefo'50mg'[id number]$  po or IM every 6 hours PRN agitation - Continue Cogentin 0.5 mg twice daily PRN - will Check AIMS as she will cooperate for testing   3. Medical Management Covid negative CMP: Calcium 8.4 CBC: WBC 3.9, H/H 11.7/37.1, platelets 60, ANC 2700 EtOH: <10 UDS: Negative TSH: 2.774 A1C: 5.1 Lipids: Cholesterol 212, LDL 138 QTC 46ms   #History of metastatic  melanoma Metastases to the brain and adrenals first reported in 2019, and patient had full brain irradiation around that  time.  Patient was with regular follow-up until April of this year, and was lost to follow-up for her July appointment and beyond. - Reached out to oncology: Per Dr. Lindi Adie- Verify with guardian whether she wants to go through treatment. If she wants to go through treatment, she will need a Whole body scan and to follow up in clinic outpatient; if not, consider hospice. - Per Dr. Lindi Adie, obtain CT CAP with contrast and CT head with and without contrast today for surveillance; once results obtained, we will discuss with the patient and guardian   #Thrombocytopenia #Leukopenia Platelets 60; ANC 2700 and WBC 3.9 Appears chronic (platelets down to 57 5 months ago)- will monitor with serial CBCs -Repeat CBC was ordered for p.m. labs yesterday; will be obtained this evening  4. Discharge Planning:              -- Social work and case management to assist with discharge planning and identification of hospital follow-up needs prior to discharge.              -- Estimated LOS: 7-10 days, pending clinical improvement             -- Discharge Concerns: Need to establish a safety plan; Medication compliance and effectiveness; pending OT evaluation             -- Discharge Goals: Return to hotel with outpatient referrals for mental health follow-up including medication management/psychotherapy   Rosezetta Schlatter, MD 07/01/2021, 7:39 AM

## 2021-07-01 NOTE — Group Note (Signed)
Date:  07/01/2021 Time:  9:52 AM  Group Topic/Focus:  Orientation:   The focus of this group is to educate the patient on the purpose and policies of crisis stabilization and provide a format to answer questions about their admission.  The group details unit policies and expectations of patients while admitted.    Participation Level:  Did Not Attend  Participation Quality:    Affect:    Cognitive:    Insight:   Engagement in Group:    Modes of Intervention:    Additional Comments:  Pt did not attend  Kaitlyn Duncan 07/01/2021, 9:52 AM

## 2021-07-02 NOTE — Group Note (Signed)
Recreation Therapy Group Note   Group Topic:Leisure Education  Group Date: 07/02/2021 Start Time: 2836 End Time: 6294 Facilitators: Victorino Sparrow, LRT,CTRS Location: [address] Dayroom   Goal Area(s) Addresses:  Patient will successfully identify benefits of leisure participation. Patient will successfully identify ways to access leisure activities. Patient will identify how leisure can help you cope.   Group Description:  LRT and patients went over what a public service announcement and leisure was.  Patients were then instructed to create a PSA that would convince someone to give leisure a try.  Patients were to define leisure, where it can be done, who can do leisure, how it can be used as a Technical sales engineer and the ultimate benefits of leisure.  Patients would then share their PSA with the rest of the group.   Affect/Mood: N/A   Participation Level: Did not attend    Clinical Observations/Individualized Feedback:     Plan: Continue to engage patient in RT group sessions 2-3x/week.   Victorino Sparrow, LRT,CTRS 07/02/2021 11:45 AM

## 2021-07-02 NOTE — Evaluation (Signed)
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 OT order placed to address cognition and evaluate ADL/iADL performance.   Clinical Impression   Kaitlyn Duncan was seen on the unit for individual OT evaluation to assess overall cognition, safety, and ADL/iADL performance. Prior to admission, pt reports being independent and was living alone in an extended-stay hotel for the last three years. Physically, pt presents on the unit with ability to ambulate independently; no LOB observed and no use of AE.  During evaluation, pt presented as fatigued, however was pleasant throughout interactions with OT. No irritability or aggressive behaviors observed.  Cognitively, pt is independent in engaging in and completing ADL/iADLs independently, however would benefit from periods of  frequent or constant supervision/check-ins PRN for safety and recent cognitive decline. OT administered the SLUMS which is an assessment tool that assesses cognition and patients receive a score of 1-30 indicating different levels of cognition: normal (27-30), mild neurocognitive disorder (21-26), and dementia (1-20). Kaitlyn Duncan scored a 17/30, indicating a cognitive score to that of "dementia". OT also administered two portions of the KELS, an assessment tool that evaluates an individual's functional living skills. Pt was administered the portions focused on self-care and safety. When presented with four pictures indicating unsafe living conditions, pt was able to identify 2/4 unsafe scenarios. Pt  was able to deem the situation safe/unsafe and give verbal explanation as to why a scenario was unsafe, including risk of fire d/t too many cords in an outlet and cooking with long sleeves over a stove. In the self-care/ADL section, pt appropriately answered questions identifying frequency of self-care tasks including basic hygiene and presented fairly well groomed on assessment.    At this time, pt demonstrates moderate difficulty with memory and problem-solving difficulties and may benefit from skilled occupational therapy services to address current difficulties with symptom management, judgement, safety, and cognitive decline. OT recommends pt follow discharge planning as instructed per treatment team, however would benefit from frequent or constant supervision at discharge to address cognitive decline mentioned above.      Recommendations for follow up therapy are one component of a multi-disciplinary discharge planning process, led by the attending physician.  Recommendations may be updated based on patient status, additional functional criteria and insurance authorization.   Follow Up Recommendations  Follow physician's recommendations for discharge plan and follow up therapies    Assistance Recommended at Discharge Frequent or constant supervision  Functional Status Assessment  Patient has had a recent decline in their functional status and/or demonstrates limited ability to make significant improvements in function in a reasonable and predictable amount of time  Equipment Recommendations  None recommended by OT    Recommendations for Other Services       Precautions / Restrictions Precautions Precautions: None Restrictions Weight Bearing Restrictions: No      Mobility Bed Mobility Overal bed mobility: Independent                  Transfers Overall transfer level: Independent Equipment used: None  Balance Overall balance assessment: No  apparent balance deficits (not formally assessed)                                         ADL either performed or assessed with clinical judgement   ADL   Eating/Feeding: Independent   Grooming: Set up;Cueing for sequencing   Upper Body Bathing: With caregiver independent assisting;Modified independent   Lower Body Bathing: Modified independent;With caregiver independent assisting   Upper Body Dressing : Independent   Lower Body Dressing: Independent   Toilet Transfer: Independent   Toileting- Clothing Manipulation and Hygiene: Modified independent;With caregiver independent assisting   Tub/ Shower Transfer: Modified independent;Cueing for sequencing;Cueing for safety   Functional mobility during ADLs: Independent       Vision Baseline Vision/History: 0 No visual deficits Patient Visual Report: No change from baseline Vision Assessment?: No apparent visual deficits     Perception     Praxis      Pertinent Vitals/Pain Pain Assessment: No/denies pain     Hand Dominance Right   Extremity/Trunk Assessment             Communication Communication Communication: No difficulties   Cognition Arousal/Alertness: Awake/alert Behavior During Therapy: WFL for tasks assessed/performed Overall Cognitive Status: Impaired/Different from baseline Area of Impairment: Orientation;Attention;Memory;Awareness;Problem solving;Following commands                 Orientation Level: Disoriented to;Time;Situation Current Attention Level: Selective Memory: Decreased short-term memory Following Commands: Follows one step commands with increased time Safety/Judgement: Decreased awareness of deficits   Problem Solving: Slow processing;Requires verbal cues General Comments: Decreased cognition, pt administered SLUMS and portion of KELS to assess cognitive function     General Comments       Exercises     Shoulder Instructions      Home Living  Family/patient expects to be discharged to:: Unsure Living Arrangements: Alone                               Additional Comments: Pt was previously living in an extended-stay hotel for 3 years PTA, however cannot return and family is not able/willing to care for pt      Prior Functioning/Environment Prior Level of Function : Patient poor historian/Family not available                        OT Problem List: Decreased cognition;Decreased activity tolerance      OT Treatment/Interventions:      OT Goals(Current goals can be found in the care plan section) Acute Rehab OT Goals Patient Stated Goal: "Can you get me out of here and back to [facility]?" OT Goal Formulation: With patient Time For Goal Achievement: 07/15/21 Potential to Achieve Goals: Good  OT Frequency:     Barriers to D/C:            Co-evaluation              AM-PAC OT "6 Clicks" Daily Activity     Outcome Measure Help from another person eating meals?: None Help from another person taking care of personal grooming?: A Little Help from another person toileting, which includes using toliet, bedpan, or urinal?: None Help from another person bathing (including washing, rinsing, drying)?: A Little Help from another person to put on and  taking off regular upper body clothing?: None Help from another person to put on and taking off regular lower body clothing?: None 6 Click Score: 22   End of Session Nurse Communication: Other (comment) (OT shared findings of OT evaluation/pt cognition concerns)  Activity Tolerance: Patient limited by fatigue (Pt sleeping upon OT arrival, engaged in session, however reports fatigue and limited concentration) Patient left: in bed  OT Visit Diagnosis: Other symptoms and signs involving cognitive function                Time: 1315-1338 OT Time Calculation (min): 23 min Charges:  OT General Charges $OT Visit: 1 Visit OT Evaluation $OT Eval Low  Complexity: 1 Low OT Treatments $Cognitive Funtion inital: Initial 15 mins 07/02/2021  Ponciano Ort, MOT, OTR/L

## 2021-07-02 NOTE — Progress Notes (Signed)
Pt received medications via IM route twice this shift due to continued refusal of PO medications. Remains hyper-religious "I told you who I am, I don't need your medicines. My doctor is in [place] and he will heal me". Cooperative with OT assessment. However, pt became anxious, demanding d/c post OT assessment with all her belongings in her hands at the exit door "I was told I could leave now after they talk to me. I have to go to [facility] now" on three separate attempts. Continued support and reassurance provided to pt. All medications administered as ordered and effects monitored. Safety checks maintained at Q 15 minutes intervals. Pt encouraged to to voice concerns. Pt tolerated all meals and fluids well. Went off unit for breakfast.

## 2021-07-02 NOTE — [facility] Suicide Risk Assessment (Signed)
[place] INPATIENT:  Family/Significant Other Suicide Prevention Education  Suicide Prevention Education:  Education Completed;  legal guardian Kaitlyn Duncan (cell) [phone], (work)  [phone], has been identified by the patient as the family member/significant other with whom the patient will be residing, and identified as the person(s) who will aid the patient in the event of a mental health crisis (suicidal ideations/suicide attempt).  With written consent from the patient, the family member/significant other has been provided the following suicide prevention education, prior to the and/or following the discharge of the patient.  CSW spoke with this pt's legal guardian who shared she is open to what is recommended by the hospital for discharge plans. Guardian is agreeable to an ACTT referral however, seems to have limited understanding of ACTT services with homeless individuals. Pt's guardian is interested in knowing whether hospital is planning to discharge her to the community or a facility. CSW attempted to gain insight into what guardian is planning for this pt in which she again put decisions back on hospital to make.    The suicide prevention education provided includes the following: Suicide risk factors Suicide prevention and interventions National Suicide Hotline telephone number [facility] assessment telephone number [facility] Emergency Assistance [place] and/or Residential Mobile Crisis Unit telephone number  Request made of family/significant other to: Remove weapons (e.g., guns, rifles, knives), all items previously/currently identified as safety concern.   Remove drugs/medications (over-the-counter, prescriptions, illicit drugs), all items previously/currently identified as a safety concern.  The family member/significant other verbalizes understanding of the suicide prevention education information provided.  The family member/significant  other agrees to remove the items of safety concern listed above.  Kaitlyn Duncan A Kaitlyn Duncan 07/02/2021, 11:06 AM

## 2021-07-02 NOTE — Progress Notes (Signed)
Adult Psychoeducational Group Note  Date:  07/02/2021 Time:  12:05 AM  Group Topic/Focus:  Wrap-Up Group:   The focus of this group is to help patients review their daily goal of treatment and discuss progress on daily workbooks.  Participation Level:  Did Not Attend  Participation Quality:  Did Not Attend  Affect:  Did Not Attend  Cognitive:  Did Not Attend  Insight: None  Engagement in Group:  Did Not Attend  Modes of Intervention:  Pt did not attend evening wrap up group tonight.  Additional Comments:    [name] 07/02/2021, 12:05 AM

## 2021-07-02 NOTE — [facility] Group Notes (Signed)
Pt didn't attend group. 

## 2021-07-02 NOTE — Progress Notes (Signed)
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  Chart Review of Past 24 hrs: The patient's chart was reviewed and nursing notes were reviewed. The patient's case was discussed in multidisciplinary team meeting.  Per MAR: - Patient has required forced medication over objection. - PRNs: None Per RN notes, patient remains irritable and hyper religious, loudly praying and declaring that she has no need to be here, but she is attending some groups. Patient slept 10 hours  Patient had the following psychiatric recommendations yesterday:  -With guardian's permission, initiate Haldol 5 mg twice daily p.o. or IM and Ativan 0.5 mg twice daily p.o. or IM with forced medication over objection protocol in place - Continue as needed agitation protocol of Haldol 5 mg every 8 hours p.o. or IM, with/ or Ativan 1 mg every 8 hours p.o. or IM and Benadryl 56m po or IM every 6 hours PRN agitation - Continue Cogentin 0.5 mg twice daily PRN   On Today's Assessment (07/02/2021): Case was discussed in the multidisciplinary team. MAR was reviewed and patient was compliant with medications. Patient seen, assessed, and discussed with attending Dr. HBerdine Addison  Patient is sleeping as our team enters the room but wakes and is cooperative with assessment.  She reports that her mood is "okay" today, she slept well, and her appetite is intact.  She denies SI/HI today, but continues to voice delusions of being a "child of [name] who does not follow your law."  She denies ideas of reference and residual AVH, previously endorsed. The results of her  imaging was discussed, and she is advised that per the oncologist, she will need to follow-up outpatient for surveillance, to which she is agreeable. Today, she denies physical issues. She insists that she does not need medications, but appears to understand when informed that she has had improvements on the medications and has been able to have her concerns addressed now that she is cooperative with assessment. She was advised that she will have an OT evaluation for discharge placement recommendations and our team would discuss with her guardian. She gave some resistance but not so much as in previous days.  Principal Problem: Schizophrenia spectrum disorder with psychotic disorder type not yet determined ([place] Diagnosis: Principal Problem:   Schizophrenia spectrum disorder with psychotic disorder type not yet determined ([place] Active Problems:   Brain metastasis ([place]   Melanoma of skin ([place]   Leukopenia   Thrombocytopenia ([place]  Total Time spent with patient: I personally spent 35 minutes on the unit in direct patient care. The direct patient care time included face-to-face time with the patient, reviewing the patient's chart, communicating with other professionals, and coordinating care. Greater than 50% of this time was spent in counseling or coordinating care with the patient regarding goals of hospitalization, psycho-education, and discharge planning needs.   Review of Records Summary: "History was obtained from review of her medical record.  It appears she has a history of melanoma of unknown primary diagnosed in July 2019 and was found to have stage IV disease including CNS, peritoneal and subcutaneous nodules.  She appears to have had whole brain radiation in August 2019  and had Mektovi and [name] treatments discontinued in August 2019 due to partial response and 12 cycles of Pembrolizumab starting in December 2019 and completed in July 2020.  Per her January 2022 office visit with oncology,  at that time she was felt to have achieved a near complete response to a combination of oral targeted therapy and immunotherapy and at that time was on active surveillance without evidence of relapse of the disease.  Her records indicate that by April 2022 she was in the emergency department for evaluation of psychosis.  Her records at that time indicate that she had a previous history of bipolar and psychosis predating her metastatic melanoma.  It appears she has had multiple ED visits for psychosis since April 2022 and did have an MRI of the brain last done in April of 2022 which showed stable findings that were compatible with treated lesions and no new areas of enhancement or hemorrhage to suggest active disease or disease progression.  In reviewing her previous medication trials it appears that the patient has been on Haldol, Zyprexa, Cogentin, Tegretol, Seroquel, IM Geodon, and Ativan in the past. "  Past Medical History:  Past Medical History:  Diagnosis Date   Allergy    Anemia    Anemia    Diabetes mellitus without complication ([place])    prediabetic   GERD (gastroesophageal reflux disease)    melanoma with met dz dx'd 01/2018   brain and adrenal   Seasonal allergies     Past Surgical History:  Procedure Laterality Date   BIOPSY  01/28/2018   Procedure: BIOPSY;  Surgeon: Laurence Spates, MD;  Location: [facility];  Service: Endoscopy;;   BREAST BIOPSY Left    ESOPHAGOGASTRODUODENOSCOPY N/A 01/28/2018   Procedure: ESOPHAGOGASTRODUODENOSCOPY (EGD);  Surgeon: Laurence Spates, MD;  Location: [facility];  Service: Endoscopy;  Laterality: N/A;   MOUTH SURGERY     OVARY SURGERY     removed "something"   Family History:  Family History  Problem Relation Age of Onset   Melanoma Mother    Hypertension Father    Breast cancer Maternal Aunt    Breast cancer Paternal Aunt    Diabetes Paternal Aunt    Breast cancer Maternal Grandmother    Breast cancer Paternal Grandmother    Diabetes  Paternal Grandmother    Colon cancer Neg Hx    Esophageal cancer Neg Hx    Pancreatic cancer Neg Hx    Stomach cancer Neg Hx    Liver disease Neg Hx    Rectal cancer Neg Hx    Family Psychiatric  History: None reported, as patient becomes irritable discussing how her family members have IVC'd her in the past few months  Social History:  Social History   Substance and Sexual Activity  Alcohol Use No     Social History   Substance and Sexual Activity  Drug Use No    Social History   Socioeconomic History   Marital status: Single    Spouse name: Not on file   Number of children: Not on file   Years of education: Not on file   Highest education level: Not on file  Occupational History   Not on file  Tobacco Use   Smoking status: Never   Smokeless tobacco: Never  Vaping Use   Vaping Use: Never used  Substance and Sexual Activity   Alcohol use: No   Drug use: No   Sexual activity: Not Currently  Other Topics Concern   Not on  file  Social History Narrative   Not on file   Social Determinants of Health   Financial Resource Strain: Not on file  Food Insecurity: Not on file  Transportation Needs: Not on file  Physical Activity: Not on file  Stress: Not on file  Social Connections: Not on file   Additional Social History:  Living at area hotels;has a guardian with DSS  Sleep: Good per patient report  Appetite:  Good  Current Medications: Current Facility-Administered Medications  Medication Dose Route Frequency Provider Last Rate Last Admin   acetaminophen (TYLENOL) tablet 650 mg  650 mg Oral Q6H PRN Rosezetta Schlatter, MD   650 mg at 06/28/21 0957   benztropine (COGENTIN) tablet 0.5 mg  0.5 mg Oral BID PRN Rosezetta Schlatter, MD       diphenhydrAMINE (BENADRYL) capsule 50 mg  50 mg Oral Q6H PRN Damita Dunnings B, MD       Or   diphenhydrAMINE (BENADRYL) injection 50 mg  50 mg Intramuscular Q6H PRN Damita Dunnings B, MD       haloperidol (HALDOL) tablet 5 mg  5 mg Oral  BID Nelda Marseille, Amy E, MD   5 mg at 06/30/21 1735   Or   haloperidol lactate (HALDOL) injection 5 mg  5 mg Intramuscular BID Nelda Marseille, Amy E, MD   5 mg at 07/01/21 1810   haloperidol (HALDOL) tablet 5 mg  5 mg Oral Q8H PRN Harlow Asa, MD       Or   haloperidol lactate (HALDOL) injection 5 mg  5 mg Intramuscular Q8H PRN Nelda Marseille, Amy E, MD       LORazepam (ATIVAN) tablet 0.5 mg  0.5 mg Oral BID Nelda Marseille, Amy E, MD   0.5 mg at 06/30/21 1737   Or   LORazepam (ATIVAN) injection 0.5 mg  0.5 mg Intramuscular BID Nelda Marseille, Amy E, MD   0.5 mg at 07/01/21 1810   LORazepam (ATIVAN) tablet 1 mg  1 mg Oral Q8H PRN Harlow Asa, MD       Or   LORazepam (ATIVAN) injection 1 mg  1 mg Intramuscular Q8H PRN Harlow Asa, MD        Lab Results:    Blood Alcohol level:  Lab Results  Component Value Date   [facility] <10 06/25/2021   ETH <10 16/96/7893    Metabolic Disorder Labs: Lab Results  Component Value Date   HGBA1C 5.1 06/28/2021   MPG 99.67 06/28/2021   MPG 119.76 02/03/2018   No results found for: PROLACTIN Lab Results  Component Value Date   CHOL 212 (H) 06/28/2021   TRIG 56 06/28/2021   HDL 63 06/28/2021   CHOLHDL 3.4 06/28/2021   VLDL 11 06/28/2021   LDLCALC 138 (H) 06/28/2021   [place] 91 02/03/2018   Imaging: EXAM: CT CHEST, ABDOMEN, AND PELVIS WITH CONTRAST   TECHNIQUE: Multidetector CT imaging of the chest, abdomen and pelvis was performed following the standard protocol during bolus administration of intravenous contrast.   CONTRAST:  72m OMNIPAQUE IOHEXOL 350 MG/ML SOLN   COMPARISON:  Multiple prior exams, most recent CT chest, abdomen and pelvis dated July 16, 2020   FINDINGS: CT CHEST FINDINGS   Cardiovascular: Normal heart size. No pericardial effusion. No significant coronary artery calcifications. No atherosclerotic disease of the thoracic aorta.   Mediastinum/Nodes: No pathologically enlarged lymph nodes seen in the chest. Normal  esophagus.   Lungs/Pleura: Central airways are patent. Lucency of the left lower lobe, likely focal emphysema. No consolidation,  pleural effusion or pneumothorax. Calcified pulmonary nodules of the left lower lobe. No suspicious pulmonary nodules.   Musculoskeletal: No chest wall mass or suspicious bone lesions identified.   CT ABDOMEN PELVIS FINDINGS   Hepatobiliary: Unchanged enhancing lesion of the caudate lobe of the liver, measuring up to 9 mm on series 3, image 50. No new liver lesions. Soft tissue lesion of the gallbladder is increased in size when compared with prior exam, measuring to 2.5 x 1.3 cm, previously measured up to 6 mm on January 08, 2020 prior. No gallbladder wall thickening or biliary ductal dilation.   Pancreas: Unremarkable. No pancreatic ductal dilatation or surrounding inflammatory changes.   Spleen: Unchanged enhancing lesions of the spleen.   Adrenals/Urinary Tract: Bilateral adrenal gland nodules slightly decreased in size when compared with prior exam and remeasured in similar plane. Measures 1.5 x 1.1 cm on the right, previously measured 1.8 x 1.3 cm. Measures 1.4 x 0.9 cm on the left, previously 1.6 x 1.3 cm. Kidneys enhance symmetrically with no evidence of hydronephrosis or nephrolithiasis. Bladder is unremarkable.   Stomach/Bowel: Stomach is within normal limits. Appendix appears normal. No evidence of bowel wall thickening, distention, or inflammatory changes.   Vascular/Lymphatic: Aortic atherosclerosis. No enlarged abdominal or pelvic lymph nodes.   Reproductive: Fibroid uterus. Endometrial thickening, which is increased compared with prior exam.   Other: No abdominal wall hernia or abnormality. No abdominopelvic ascites.   Musculoskeletal: Stable sclerotic lesion of the right sacrum. No aggressive appearing osseous lesions   IMPRESSION: 1. Bilateral adrenal nodules are decreased in size when compared with most recent prior exam. 2.  Soft tissue lesion of the gallbladder increased in size when compared with prior exams. 3. Unchanged indeterminate hyperenhancing lesions of the caudate lobe of the liver and spleen. 4. Endometrial thickening, which is increased compared with prior exam. Recommend further evaluation with pelvic ultrasound.     Electronically Signed   By: Yetta Glassman M.D.   On: 06/30/2021 16:34  EXAM: CT HEAD WITHOUT AND WITH CONTRAST   TECHNIQUE: Contiguous axial images were obtained from the base of the skull through the vertex without and with intravenous contrast   CONTRAST:  59mL OMNIPAQUE IOHEXOL 350 MG/ML SOLN   COMPARISON:  06/21/2021 CT head without contrast, correlation is also made with 10/04/2020 MRI head with and without contrast   FINDINGS: Brain: No acute infarct, hemorrhage, mass effect, or midline shift. No definite abnormal enhancement or mass lesion is seen. No enhancement is seen to correlate with the previously noted enhancement in the medial right parietal lobe on the 10/04/2020 MRI. No hydrocephalus or extra-axial collection.   Vascular: No hyperdense vessel or unexpected calcification. Visible vessels are patent.   Skull: Normal. Negative for fracture or focal lesion.   Sinuses/Orbits: No acute finding.   Other: Trace fluid in left mastoid air cells.   IMPRESSION: No definite enhancing lesion or other acute intracranial process. If there is persistent clinical concern for metastatic disease, an MRI with and without contrast is recommended.     Electronically Signed   By: Merilyn Baba M.D.   On: 06/30/2021 15:45  Physical Findings:  Musculoskeletal: Strength & Muscle Tone: within normal limits Gait & Station: normal Patient leans: N/A  Psychiatric Specialty Exam:  Presentation  General Appearance: Appropriate for Environment; Casual; Fairly Groomed  Eye Contact:Good  Speech:Clear and Coherent; Normal Rate  Speech Volume:  Normal  Handedness:Right   Mood and Affect  Mood:Irritable but "okay"  Affect:Somewhat irritable but largely cooperative  and engaged in assessment   Thought Process  Thought Processes: Goal directed  Orientation: Full- person, place, and time  Thought Content: Denies SI/HI/AVH, paranoia. Still reports grandiose delusions. Does not appear internally preoccupied  History of Schizophrenia/Schizoaffective disorder:Yes (Patient reported diagnosis of schizophrenia at [facility] in April 2022)  Duration of Psychotic Symptoms:Greater than six months  Hallucinations:Denies  Ideas of Reference:Denies  Suicidal Thoughts: Denies  Homicidal Thoughts: Denies   Sensorium  Memory:Immediate Fair; Recent Fair  Judgment:Impaired  Insight:Poor  Executive Functions  Concentration:Fair  Attention Span:Fair  [place]   Psychomotor Activity  Psychomotor Activity: Slowed, as drowsy from medications; less agitated throughout interview    Assets  Assets:Resilience, has guardian, housing   Sleep  Sleep:Number of Hours of Sleep: 10   Physical Exam Vitals and nursing note reviewed.  Constitutional:      General: She is not in acute distress.    Appearance: Normal appearance. She is ill-appearing.     Comments: Chronically ill-appearing  HENT:     Head: Normocephalic and atraumatic.     Mouth/Throat:     Comments: With sore to right sided upper lip; healing appropriately Pulmonary:     Effort: Pulmonary effort is normal.  Neurological:     General: No focal deficit present.     Mental Status: She is alert and oriented to person, place, and time.     Motor: No weakness.     Gait: Gait normal.   Review of Systems  HENT:  Negative for congestion.   Respiratory:  Negative for shortness of breath.   Cardiovascular:  Negative for chest pain.  Gastrointestinal:  Negative for abdominal pain, constipation, diarrhea, nausea and  vomiting.  Genitourinary: Negative.   Musculoskeletal:  Negative for back pain.  Neurological:  Negative for headaches.       "Woozy" after taking medications  Blood pressure (!) 126/91, pulse 86, temperature 97.9 F (36.6 C), temperature source Oral, resp. rate 18, height _0  (1.549 m), weight 56 kg, SpO2 99 %. Body mass index is 23.32 kg/m.   Treatment Plan Summary: ASSESSMENT: Principal Problem:   Schizophrenia spectrum disorder with psychotic disorder type not yet determined ([place]) Active Problems:   Brain metastasis (HCC)   Melanoma of skin (HCC)   Leukopenia   Thrombocytopenia (HCC)     The patient is a 50 year old female with a reported history of bipolar disorder and unspecified psychosis, and a medical history of melanoma with metastases to the brain, admitted under IVC for bizarre behaviors, agitation, delusions, and hyperreligiosity.  On assessment, patient remains irritable and hyperreligious, with residual delusions, report of AVH, and paranoia on exam, all of which have improved.    Treatment Plan Summary: Daily contact with patient to assess and evaluate symptoms and progress in treatment and Medication management    Safety and Monitoring: INVOLUTARILY (by [employer]) admission to inpatient psychiatric unit for safety, stabilization and treatment Daily contact with patient to assess and evaluate symptoms and progress in treatment Patient's case to be discussed in multi-disciplinary team meeting Observation Level : q15 minute checks Vital signs: q12 hours Precautions: suicide, elopement, and assault   2. Psychiatric Problems #Unspecified schizophrenia spectrum and other psychotic d/o (r/o psychosis secondary to general medical condition - metatstatic melanoma vs bipolar d/o MRE manic with psychotic features) Notes from previous [place] visit report some baseline delusions, hyperreligiosity in 01/2021, with problems ongoing since 09/2020, per patient's reporting.  -Continue  Haldol 5 mg twice daily p.o.  or IM and Ativan 0.5 mg twice daily p.o. or IM with forced medication over objection protocol in place - Continue as needed agitation protocol of Haldol 5 mg every 8 hours p.o. or IM, with/ or Ativan 1 mg every 8 hours p.o. or IM and Benadryl 103m po or IM every 6 hours PRN agitation - Continue Cogentin 0.5 mg twice daily PRN - will Check AIMS as she will cooperate for testing   3. Medical Management Covid negative CMP: Calcium 8.4 CBC: WBC 3.9, H/H 11.7/37.1, platelets 60, ANC 2700 EtOH: <10 UDS: Negative TSH: 2.774 A1C: 5.1 Lipids: Cholesterol 212, LDL 138 QTC 4251m  #History of metastatic melanoma Metastases to the brain and adrenals first reported in 2019, and patient had full brain irradiation around that time.  Patient was with regular follow-up until April of this year, and was lost to follow-up for her July appointment and beyond. - Reached out to oncology: Per Dr. GuLindi AdieVerify with guardian whether she wants to go through treatment. If she wants to go through treatment, she will need a Whole body scan and to follow up in clinic outpatient; if not, consider hospice. Obtain CT CAP with contrast and CT head with and without contrast today for surveillance; discussed with the patient today and will discuss with guardian that patient requests outpatient follow-up for surveillance.      #Thrombocytopenia, improving #Leukopenia, resolved Platelets 60 (71 on repeat); ANC 2700 (3600 on repeat) and WBC 3.9 (5.1 on repeat) Appears chronic (platelets down to 57 5 months ago)- will monitor with serial CBCs -Repeat CBC was ordered for p.m. labs yesterday; will be obtained this evening  4. Discharge Planning:              -- Social work and case management to assist with discharge planning and identification of hospital follow-up needs prior to discharge.              -- Estimated LOS: 7-10 days, pending clinical improvement             -- Discharge Concerns:  Need to establish a safety plan; Medication compliance and effectiveness; pending OT evaluation for housing recommendations             -- Discharge Goals: Return to hotel with outpatient referrals for mental health follow-up including medication management/psychotherapy   CoRosezetta SchlatterMD 07/02/2021, 12:17 AM

## 2021-07-02 NOTE — Progress Notes (Signed)
Pt was encouraged but didn't attend orientation/goals group.

## 2021-07-02 NOTE — Progress Notes (Signed)
°   07/02/21 0600  Sleep  Number of Hours 10

## 2021-07-02 NOTE — Progress Notes (Signed)
°   07/02/21 2200  Psych Admission Type (Psych Patients Only)  Admission Status Involuntary  Psychosocial Assessment  Patient Complaints Anxiety  Eye Contact Intense;Suspiciousness  Facial Expression Angry;Anxious;Worried  Affect Angry;Anxious;Apprehensive;Preoccupied  Speech Argumentative;Pressured  Interaction Defensive;Demanding;Hostile  Motor Activity Slow  Appearance/Hygiene Disheveled  Behavior Characteristics Cooperative  Mood Labile  Aggressive Behavior  Effect No apparent injury  Thought Process  Coherency Concrete thinking  Content Blaming others;Religiosity;Paranoia  Delusions Religious  Perception Derealization  Hallucination None reported or observed  Judgment Impaired  Confusion Mild  Danger to Self  Current suicidal ideation? Denies  Danger to Others  Danger to Others None reported or observed

## 2021-07-03 ENCOUNTER — Encounter (HOSPITAL_COMMUNITY): Payer: Self-pay

## 2021-07-03 DIAGNOSIS — F29 Unspecified psychosis not due to a substance or known physiological condition: Secondary | ICD-10-CM | POA: Diagnosis not present

## 2021-07-03 NOTE — Progress Notes (Signed)
D:  [name] was isolative to her room this evening.  Minimal in her interactions and  difficult to engage in conversations.  Mainly kept her eyes closed during assessment but was not argumentative, irritable or hostile towards this Probation officer.  She denied SI/HI or AVH.  She did complain of right knee pain and requested tylenol which was given along with an ice pack with good relief.   A:  1:1 with RN for support and encouragement.  Q 15 minute checks maintained for safety.  Encouraged participation in group and unit activities. R:  She remains safe on the unit.  She is currently resting with he eyes closed and appears to be asleep.  We will continue to monitor the progress towards her goals.     07/03/21 2050  Psych Admission Type (Psych Patients Only)  Admission Status Involuntary  Psychosocial Assessment  Patient Complaints Anergic  Eye Contact Suspiciousness  Facial Expression Blank  Affect Blunted  Speech Slow;Soft  Interaction Avoidant;Guarded;Forwards little  Motor Activity Slow  Appearance/Hygiene Disheveled  Behavior Characteristics Cooperative  Mood Suspicious  Thought Pension scheme manager thinking  Content Blaming others;Religiosity;Paranoia  Delusions Religious  Perception Derealization  Hallucination None reported or observed  Judgment Impaired  Confusion Mild  Danger to Self  Current suicidal ideation? Denies  Danger to Others  Danger to Others None reported or observed

## 2021-07-03 NOTE — Group Note (Signed)
Recreation Therapy Group Note   Group Topic:Personal Development  Group Date: 07/03/2021 Start Time: 1005 End Time: 1040 Facilitators: Victorino Sparrow, LRT,CTRS Location: [address] Dayroom   Goal Area(s) Addresses:  Patient will identify some accomplishments and low points of this past year. Patient will identify goals for the upcoming year.  Group Description: LRT and patients discussed this past year and looking forward to the upcoming year.  Patients were given a worksheet that broke down areas for patients to reflect on such as goals achieved, what has inspired them, lowest point, new skills learned, lessons learned, etc.  Patients then shared the areas they were comfortable with the rest of the group.   Affect/Mood: N/A   Participation Level: Did not attend    Clinical Observations/Individualized Feedback:     Plan: Continue to engage patient in RT group sessions 2-3x/week.   Victorino Sparrow, Glennis Brink 07/03/2021 12:29 PM

## 2021-07-03 NOTE — Group Note (Signed)
LCSW Group Therapy Note  Group Date: 07/03/2021 Start Time: 1100 End Time: 1200   Type of Therapy and Topic:  Group Therapy - Healthy vs Unhealthy Coping Skills  Participation Level:  Did Not Attend   Description of Group The focus of this group was to determine what unhealthy coping techniques typically are used by group members and what healthy coping techniques would be helpful in coping with various problems. Patients were guided in becoming aware of the differences between healthy and unhealthy coping techniques. Patients were asked to identify 2-3 healthy coping skills they would like to learn to use more effectively.  Therapeutic Goals Patients learned that coping is what human beings do all day long to deal with various situations in their lives Patients defined and discussed healthy vs unhealthy coping techniques Patients identified their preferred coping techniques and identified whether these were healthy or unhealthy Patients determined 2-3 healthy coping skills they would like to become more familiar with and use more often. Patients provided support and ideas to each other   Summary of Patient Progress:  Did not attend   Therapeutic Modalities Cognitive [place], LCSW 07/03/2021  11:32 AM

## 2021-07-03 NOTE — BH IP Treatment Plan (Signed)
Interdisciplinary Treatment and Diagnostic Plan Update  07/03/2021 Time of Session: 10:00am Kaitlyn Duncan MRN: [id number]  Principal Diagnosis: Schizophrenia spectrum disorder with psychotic disorder type not yet determined [facility])  Secondary Diagnoses: Principal Problem:   Schizophrenia spectrum disorder with psychotic disorder type not yet determined ([place]) Active Problems:   Brain metastasis ([place])   Melanoma of skin ([place])   Leukopenia   Thrombocytopenia ([facility])   Current Medications:  Current Facility-Administered Medications  Medication Dose Route Frequency Provider Last Rate Last Admin   acetaminophen (TYLENOL) tablet 650 mg  650 mg Oral Q6H PRN Rosezetta Schlatter, MD   650 mg at 06/28/21 0957   benztropine (COGENTIN) tablet 0.5 mg  0.5 mg Oral BID PRN Rosezetta Schlatter, MD       diphenhydrAMINE (BENADRYL) capsule 50 mg  50 mg Oral Q6H PRN Freida Busman, MD       Or   diphenhydrAMINE (BENADRYL) injection 50 mg  50 mg Intramuscular Q6H PRN Damita Dunnings B, MD       haloperidol (HALDOL) tablet 5 mg  5 mg Oral BID Nelda Marseille, Amy E, MD   5 mg at 06/30/21 1735   Or   haloperidol lactate (HALDOL) injection 5 mg  5 mg Intramuscular BID Harlow Asa, MD   5 mg at 07/03/21 7322   haloperidol (HALDOL) tablet 5 mg  5 mg Oral Q8H PRN Harlow Asa, MD       Or   haloperidol lactate (HALDOL) injection 5 mg  5 mg Intramuscular Q8H PRN Harlow Asa, MD       LORazepam (ATIVAN) tablet 0.5 mg  0.5 mg Oral BID Nelda Marseille, Amy E, MD   0.5 mg at 06/30/21 1737   Or   LORazepam (ATIVAN) injection 0.5 mg  0.5 mg Intramuscular BID Nelda Marseille, Amy E, MD   0.5 mg at 07/03/21 0957   LORazepam (ATIVAN) tablet 1 mg  1 mg Oral Q8H PRN Harlow Asa, MD       Or   LORazepam (ATIVAN) injection 1 mg  1 mg Intramuscular Q8H PRN Harlow Asa, MD       PTA Medications: Medications Prior to Admission  Medication Sig Dispense Refill Last Dose   clotrimazole (LOTRIMIN) 1 % cream Apply to  affected area 2 times daily (Patient not taking: Reported on 06/25/2021) 15 g 0    fluticasone (FLONASE) 50 MCG/ACT nasal spray Place 2 sprays into both nostrils daily. (Patient not taking: Reported on 06/25/2021) 16 g 2    hydrocortisone cream 1 % Apply to affected area 2 times daily (Patient not taking: Reported on 06/25/2021) 15 g 0     Patient Stressors: Marital or family conflict   Medication change or noncompliance    Patient Strengths: General fund of knowledge  Motivation for treatment/growth   Treatment Modalities: Medication Management, Group therapy, Case management,  1 to 1 session with clinician, Psychoeducation, Recreational therapy.   Physician Treatment Plan for Primary Diagnosis: Schizophrenia spectrum disorder with psychotic disorder type not yet determined ([place]) Long Term Goal(s): Improvement in symptoms so as ready for discharge   Short Term Goals: Ability to identify changes in lifestyle to reduce recurrence of condition will improve Ability to identify and develop effective coping behaviors will improve Ability to maintain clinical measurements within normal limits will improve Compliance with prescribed medications will improve Ability to verbalize feelings will improve Ability to demonstrate self-control will improve  Medication Management: Evaluate patient's response, side effects, and tolerance of medication regimen.  Therapeutic Interventions: 1 to 1 sessions, Unit Group sessions and Medication administration.  Evaluation of Outcomes: Not Met  Physician Treatment Plan for Secondary Diagnosis: Principal Problem:   Schizophrenia spectrum disorder with psychotic disorder type not yet determined ([place]) Active Problems:   Brain metastasis ([place])   Melanoma of skin ([place])   Leukopenia   Thrombocytopenia ([place])  Long Term Goal(s): Improvement in symptoms so as ready for discharge   Short Term Goals: Ability to identify changes in lifestyle to reduce recurrence  of condition will improve Ability to identify and develop effective coping behaviors will improve Ability to maintain clinical measurements within normal limits will improve Compliance with prescribed medications will improve Ability to verbalize feelings will improve Ability to demonstrate self-control will improve     Medication Management: Evaluate patient's response, side effects, and tolerance of medication regimen.  Therapeutic Interventions: 1 to 1 sessions, Unit Group sessions and Medication administration.  Evaluation of Outcomes: Not Met   RN Treatment Plan for Primary Diagnosis: Schizophrenia spectrum disorder with psychotic disorder type not yet determined ([place]) Long Term Goal(s): Knowledge of disease and therapeutic regimen to maintain health will improve  Short Term Goals: Ability to remain free from injury will improve, Ability to verbalize frustration and anger appropriately will improve, Ability to demonstrate self-control, Ability to participate in decision making will improve, Ability to verbalize feelings will improve, Ability to disclose and discuss suicidal ideas, Ability to identify and develop effective coping behaviors will improve, and Compliance with prescribed medications will improve  Medication Management: RN will administer medications as ordered by provider, will assess and evaluate patient's response and provide education to patient for prescribed medication. RN will report any adverse and/or side effects to prescribing provider.  Therapeutic Interventions: 1 on 1 counseling sessions, Psychoeducation, Medication administration, Evaluate responses to treatment, Monitor vital signs and CBGs as ordered, Perform/monitor CIWA, COWS, AIMS and Fall Risk screenings as ordered, Perform wound care treatments as ordered.  Evaluation of Outcomes: Not Met   LCSW Treatment Plan for Primary Diagnosis: Schizophrenia spectrum disorder with psychotic disorder type not yet  determined ([place]) Long Term Goal(s): Safe transition to appropriate next level of care at discharge, Engage patient in therapeutic group addressing interpersonal concerns.  Short Term Goals: Engage patient in aftercare planning with referrals and resources, Increase social support, Increase ability to appropriately verbalize feelings, Increase emotional regulation, Identify triggers associated with mental health/substance abuse issues, and Increase skills for wellness and recovery  Therapeutic Interventions: Assess for all discharge needs, 1 to 1 time with Social worker, Explore available resources and support systems, Assess for adequacy in community support network, Educate family and significant other(s) on suicide prevention, Complete Psychosocial Assessment, Interpersonal group therapy.  Evaluation of Outcomes: Not Met   Progress in Treatment: Attending groups: No. Participating in groups: No. Taking medication as prescribed: No. Toleration medication: No. Family/Significant other contact made: Yes, individual(s) contacted:  Pt's legal guardain Patient understands diagnosis: No. Discussing patient identified problems/goals with staff: No. Medical problems stabilized or resolved: Yes. Denies suicidal/homicidal ideation: Yes. Issues/concerns per patient self-inventory: No. Other: None   New problem(s) identified: No, Describe:  None   New Short Term/Long Term Goal(s):medication stabilization, elimination of SI thoughts, development of comprehensive mental wellness plan.    Patient Goals:  Pt was unable to participate due to having to receive force medications.    Discharge Plan or Barriers: Patient is to be referred to [facility] for follow up at discharge   Reason for Continuation of Hospitalization:  Delusions  Hallucinations Medication stabilization   Estimated Length of Stay: 3-5 days   Scribe for Treatment Team: Vassie Moselle, LCSW 07/03/2021 11:47 AM

## 2021-07-03 NOTE — Progress Notes (Signed)
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  Chart Review of Past 24 hrs: The patient's chart was reviewed and nursing notes were reviewed. The patient's case was discussed in multidisciplinary team meeting.  Per MAR: - Patient has required forced medication over objection. - PRNs: None Per RN notes, patient remains argumentative and hostile with staff. She has not had any improvements in insight. She is still getting medications IM. She is not attending groups.  Patient had the following psychiatric recommendations yesterday:  -With guardian's permission, initiate Haldol 5 mg twice daily p.o. or IM and Ativan 0.5 mg twice daily p.o. or IM with forced medication over objection protocol in place - Continue as needed agitation protocol of Haldol 5 mg every 8 hours p.o. or IM, with/ or Ativan 1 mg every 8 hours p.o. or IM and Benadryl $RemoveBefo'50mg'vLWNlrKxokT$  po or IM every 6 hours PRN agitation - Continue Cogentin 0.5 mg twice daily PRN   On Today's Assessment (07/03/2021): Case was discussed in the multidisciplinary team.   Patient was seen today on rounds. She states that she is not eating because she feels "woozy". She states it is because of the medication. I ask about what this feeling is like, in her head or where, what time of day, ect, but she shuts her eyes and refuses to answer. I explain that I need to find out what medication is making her woozy and what can be done about it, but she just states "stop the medications". She then states "all of them" when asked again to clarify which one. She says its because she "don't  take no drugs" and that she is the daughter of [name]. After attempts to negotiate a different medication if she does not want the haldol, she reiterates that she does not have schizophrenia, and eventually the conversation has to be concluded.   Principal Problem: Schizophrenia spectrum disorder with psychotic disorder type not yet determined ([place]) Diagnosis: Principal Problem:   Schizophrenia spectrum disorder with psychotic disorder type not yet determined ([place]) Active Problems:   Brain metastasis ([place])   Melanoma of skin ([place])   Leukopenia   Thrombocytopenia ([place])  Past Medical History:  Past Medical History:  Diagnosis Date   Allergy    Anemia    Anemia    Diabetes mellitus without complication ([place])    prediabetic   GERD (gastroesophageal reflux disease)    melanoma with met dz dx'd 01/2018   brain and adrenal   Seasonal allergies     Past Surgical History:  Procedure Laterality Date   BIOPSY  01/28/2018   Procedure: BIOPSY;  Surgeon: Laurence Spates, MD;  Location: [facility]OS[facility];  Service: Endoscopy;;   BREAST BIOPSY Left    ESOPHAGOGASTRODUODENOSCOPY N/A 01/28/2018   Procedure: ESOPHAGOGASTRODUODENOSCOPY (EGD);  Surgeon: Laurence Spates, MD;  Location: [facility];  Service: Endoscopy;  Laterality: N/A;   MOUTH SURGERY     OVARY SURGERY     removed "something"   Family History:  Family History  Problem Relation Age of Onset   Melanoma Mother    Hypertension Father    Breast cancer Maternal Aunt    Breast cancer  Paternal Aunt    Diabetes Paternal Aunt    Breast cancer Maternal Grandmother    Breast cancer Paternal Grandmother    Diabetes Paternal Grandmother    Colon cancer Neg Hx    Esophageal cancer Neg Hx    Pancreatic cancer Neg Hx    Stomach cancer Neg Hx    Liver disease Neg Hx    Rectal cancer Neg Hx    Family Psychiatric  History: None reported, as patient becomes irritable discussing how her family members have IVC'd her in the past few months  Social  History:  Social History   Substance and Sexual Activity  Alcohol Use No     Social History   Substance and Sexual Activity  Drug Use No    Social History   Socioeconomic History   Marital status: Single    Spouse name: Not on file   Number of children: Not on file   Years of education: Not on file   Highest education level: Not on file  Occupational History   Not on file  Tobacco Use   Smoking status: Never   Smokeless tobacco: Never  Vaping Use   Vaping Use: Never used  Substance and Sexual Activity   Alcohol use: No   Drug use: No   Sexual activity: Not Currently  Other Topics Concern   Not on file  Social History Narrative   Not on file   Social Determinants of Health   Financial Resource Strain: Not on file  Food Insecurity: Not on file  Transportation Needs: Not on file  Physical Activity: Not on file  Stress: Not on file  Social Connections: Not on file   Additional Social History:  Living at area hotels;has a guardian with DSS  Sleep: Good per patient report  Appetite:  Good  Current Medications: Current Facility-Administered Medications  Medication Dose Route Frequency Provider Last Rate Last Admin   acetaminophen (TYLENOL) tablet 650 mg  650 mg Oral Q6H PRN Rosezetta Schlatter, MD   650 mg at 06/28/21 0957   benztropine (COGENTIN) tablet 0.5 mg  0.5 mg Oral BID PRN Rosezetta Schlatter, MD       diphenhydrAMINE (BENADRYL) capsule 50 mg  50 mg Oral Q6H PRN Damita Dunnings B, MD       Or   diphenhydrAMINE (BENADRYL) injection 50 mg  50 mg Intramuscular Q6H PRN Damita Dunnings B, MD       haloperidol (HALDOL) tablet 5 mg  5 mg Oral BID Nelda Marseille, Amy E, MD   5 mg at 06/30/21 1735   Or   haloperidol lactate (HALDOL) injection 5 mg  5 mg Intramuscular BID Nelda Marseille, Amy E, MD   5 mg at 07/03/21 0957   haloperidol (HALDOL) tablet 5 mg  5 mg Oral Q8H PRN Harlow Asa, MD       Or   haloperidol lactate (HALDOL) injection 5 mg  5 mg Intramuscular Q8H PRN  Nelda Marseille, Amy E, MD       LORazepam (ATIVAN) tablet 0.5 mg  0.5 mg Oral BID Nelda Marseille, Amy E, MD   0.5 mg at 06/30/21 1737   Or   LORazepam (ATIVAN) injection 0.5 mg  0.5 mg Intramuscular BID Nelda Marseille, Amy E, MD   0.5 mg at 07/03/21 0957   LORazepam (ATIVAN) tablet 1 mg  1 mg Oral Q8H PRN Harlow Asa, MD       Or   LORazepam (ATIVAN) injection 1 mg  1 mg Intramuscular Q8H PRN Nelda Marseille, Amy  E, MD        Lab Results:    Blood Alcohol level:  Lab Results  Component Value Date   [facility] <10 06/25/2021   ETH <10 56/25/6389    Metabolic Disorder Labs: Lab Results  Component Value Date   HGBA1C 5.1 06/28/2021   MPG 99.67 06/28/2021   MPG 119.76 02/03/2018   No results found for: PROLACTIN Lab Results  Component Value Date   CHOL 212 (H) 06/28/2021   TRIG 56 06/28/2021   HDL 63 06/28/2021   CHOLHDL 3.4 06/28/2021   VLDL 11 06/28/2021   LDLCALC 138 (H) 06/28/2021   LDLCALC 91 02/03/2018   Imaging: EXAM: CT CHEST, ABDOMEN, AND PELVIS WITH CONTRAST   TECHNIQUE: Multidetector CT imaging of the chest, abdomen and pelvis was performed following the standard protocol during bolus administration of intravenous contrast.   CONTRAST:  53mL OMNIPAQUE IOHEXOL 350 MG/ML SOLN   COMPARISON:  Multiple prior exams, most recent CT chest, abdomen and pelvis dated July 16, 2020   FINDINGS: CT CHEST FINDINGS   Cardiovascular: Normal heart size. No pericardial effusion. No significant coronary artery calcifications. No atherosclerotic disease of the thoracic aorta.   Mediastinum/Nodes: No pathologically enlarged lymph nodes seen in the chest. Normal esophagus.   Lungs/Pleura: Central airways are patent. Lucency of the left lower lobe, likely focal emphysema. No consolidation, pleural effusion or pneumothorax. Calcified pulmonary nodules of the left lower lobe. No suspicious pulmonary nodules.   Musculoskeletal: No chest wall mass or suspicious bone lesions identified.    CT ABDOMEN PELVIS FINDINGS   Hepatobiliary: Unchanged enhancing lesion of the caudate lobe of the liver, measuring up to 9 mm on series 3, image 50. No new liver lesions. Soft tissue lesion of the gallbladder is increased in size when compared with prior exam, measuring to 2.5 x 1.3 cm, previously measured up to 6 mm on January 08, 2020 prior. No gallbladder wall thickening or biliary ductal dilation.   Pancreas: Unremarkable. No pancreatic ductal dilatation or surrounding inflammatory changes.   Spleen: Unchanged enhancing lesions of the spleen.   Adrenals/Urinary Tract: Bilateral adrenal gland nodules slightly decreased in size when compared with prior exam and remeasured in similar plane. Measures 1.5 x 1.1 cm on the right, previously measured 1.8 x 1.3 cm. Measures 1.4 x 0.9 cm on the left, previously 1.6 x 1.3 cm. Kidneys enhance symmetrically with no evidence of hydronephrosis or nephrolithiasis. Bladder is unremarkable.   Stomach/Bowel: Stomach is within normal limits. Appendix appears normal. No evidence of bowel wall thickening, distention, or inflammatory changes.   Vascular/Lymphatic: Aortic atherosclerosis. No enlarged abdominal or pelvic lymph nodes.   Reproductive: Fibroid uterus. Endometrial thickening, which is increased compared with prior exam.   Other: No abdominal wall hernia or abnormality. No abdominopelvic ascites.   Musculoskeletal: Stable sclerotic lesion of the right sacrum. No aggressive appearing osseous lesions   IMPRESSION: 1. Bilateral adrenal nodules are decreased in size when compared with most recent prior exam. 2. Soft tissue lesion of the gallbladder increased in size when compared with prior exams. 3. Unchanged indeterminate hyperenhancing lesions of the caudate lobe of the liver and spleen. 4. Endometrial thickening, which is increased compared with prior exam. Recommend further evaluation with pelvic ultrasound.     Electronically  Signed   By: Yetta Glassman M.D.   On: 06/30/2021 16:34  EXAM: CT HEAD WITHOUT AND WITH CONTRAST   TECHNIQUE: Contiguous axial images were obtained from the base of the skull through the vertex without  and with intravenous contrast   CONTRAST:  13mL OMNIPAQUE IOHEXOL 350 MG/ML SOLN   COMPARISON:  06/21/2021 CT head without contrast, correlation is also made with 10/04/2020 MRI head with and without contrast   FINDINGS: Brain: No acute infarct, hemorrhage, mass effect, or midline shift. No definite abnormal enhancement or mass lesion is seen. No enhancement is seen to correlate with the previously noted enhancement in the medial right parietal lobe on the 10/04/2020 MRI. No hydrocephalus or extra-axial collection.   Vascular: No hyperdense vessel or unexpected calcification. Visible vessels are patent.   Skull: Normal. Negative for fracture or focal lesion.   Sinuses/Orbits: No acute finding.   Other: Trace fluid in left mastoid air cells.   IMPRESSION: No definite enhancing lesion or other acute intracranial process. If there is persistent clinical concern for metastatic disease, an MRI with and without contrast is recommended.     Electronically Signed   By: Merilyn Baba M.D.   On: 06/30/2021 15:45  Physical Findings:  Musculoskeletal: Strength & Muscle Tone: within normal limits Gait & Station: normal Patient leans: N/A  Psychiatric Specialty Exam:  Presentation  General Appearance: Disheveled  Eye Contact:Fleeting  Speech:Normal Rate  Speech Volume: Normal  Handedness:Right   Mood and Affect  Mood:Irritable   Affect: irritable   Thought Process  Thought Processes: Goal directed  Orientation: person, place, and time but not situation  Thought Content: Denies SI/HI/AVH, paranoia. Poor reality testing  History of Schizophrenia/Schizoaffective disorder:Yes  Duration of Psychotic Symptoms:Greater than six  months  Hallucinations:Denies  Ideas of Reference:Denies  Suicidal Thoughts: Denies  Homicidal Thoughts: Denies   Sensorium  Memory:Immediate Fair; Recent Poor; Remote Poor  Judgment:Impaired  Insight:Poor  Executive Functions  Concentration:Poor  Attention Span:Poor  Recall:Poor  Fund of Knowledge:Poor  Language:Fair   Psychomotor Activity  Psychomotor Activity: Slowed, as drowsy from medications; less agitated throughout interview    Assets  Assets:Resilience, has guardian, housing   Sleep  Sleep:Number of Hours of Sleep: 10   Physical Exam Vitals and nursing note reviewed.  Constitutional:      General: She is not in acute distress.    Appearance: Normal appearance. She is ill-appearing.     Comments: Chronically ill-appearing  HENT:     Head: Normocephalic and atraumatic.     Mouth/Throat:     Comments: With sore to right sided upper lip; healing appropriately Pulmonary:     Effort: Pulmonary effort is normal.  Neurological:     General: No focal deficit present.     Mental Status: She is alert and oriented to person, place, and time.     Motor: No weakness.     Gait: Gait normal.   Review of Systems  HENT:  Negative for congestion.   Respiratory:  Negative for shortness of breath.   Cardiovascular:  Negative for chest pain.  Gastrointestinal:  Negative for abdominal pain, constipation, diarrhea, nausea and vomiting.  Genitourinary: Negative.   Musculoskeletal:  Negative for back pain.  Neurological:  Negative for headaches.       "Woozy" after taking medications  Blood pressure 128/88, pulse 88, temperature 97.6 F (36.4 C), resp. rate 18, height $RemoveBe'5\' 1"'YylRoYlJF$  (1.549 m), weight 56 kg, SpO2 99 %. Body mass index is 23.32 kg/m.   Treatment Plan Summary: ASSESSMENT: Principal Problem:   Schizophrenia spectrum disorder with psychotic disorder type not yet determined [facility]) Active Problems:   Brain metastasis (H[facility])   Melanoma of skin (H[facility])    Leukopenia   Thrombocytopenia ([place])  The patient is a 50 year old female with a reported history of bipolar disorder and unspecified psychosis, and a medical history of melanoma with metastases to the brain, admitted under IVC for bizarre behaviors, agitation, delusions, and hyperreligiosity.  On assessment, patient remains irritable and hyperreligious, with residual delusions, report of AVH, and paranoia on exam, all of which have improved.    Treatment Plan Summary: Daily contact with patient to assess and evaluate symptoms and progress in treatment and Medication management    Safety and Monitoring: INVOLUTARILY (by [employer]) admission to inpatient psychiatric unit for safety, stabilization and treatment Daily contact with patient to assess and evaluate symptoms and progress in treatment Patient's case to be discussed in multi-disciplinary team meeting Observation Level : q15 minute checks Vital signs: q12 hours Precautions: suicide, elopement, and assault   2. Psychiatric Problems #Unspecified schizophrenia spectrum and other psychotic d/o (r/o psychosis secondary to general medical condition - metatstatic melanoma vs bipolar d/o MRE manic with psychotic features) Notes from previous [place] visit report some baseline delusions, hyperreligiosity in 01/2021, with problems ongoing since 09/2020, per patient's reporting.  -Continue Haldol 5 mg twice daily p.o. or IM and Ativan 0.5 mg twice daily p.o. or IM with forced medication over objection protocol in place - Continue as needed agitation protocol of Haldol 5 mg every 8 hours p.o. or IM, with/ or Ativan 1 mg every 8 hours p.o. or IM and Benadryl $RemoveBefo'50mg'AcCshGevDSR$  po or IM every 6 hours PRN agitation - Continue Cogentin 0.5 mg twice daily PRN - will Check AIMS as she will cooperate for testing   3. Medical Management Covid negative CMP: Calcium 8.4 CBC: WBC 3.9, H/H 11.7/37.1, platelets 60, ANC 2700 EtOH: <10 UDS: Negative TSH: 2.774 A1C: 5.1 Lipids:  Cholesterol 212, LDL 138 QTC 462ms -repeating BMP due to poor PO intake   #History of metastatic melanoma Metastases to the brain and adrenals first reported in 2019, and patient had full brain irradiation around that time.  Patient was with regular follow-up until April of this year, and was lost to follow-up for her July appointment and beyond. - Reached out to oncology: Per Dr. Lindi Adie- Verify with guardian whether she wants to go through treatment. If she wants to go through treatment, she will need a Whole body scan and to follow up in clinic outpatient; if not, consider hospice. Obtain CT CAP with contrast and CT head with and without contrast today for surveillance; discussed with the patient today and will discuss with guardian that patient requests outpatient follow-up for surveillance.      #Thrombocytopenia, improving #Leukopenia, resolved Platelets 60 (71 on repeat); ANC 2700 (3600 on repeat) and WBC 3.9 (5.1 on repeat) Appears chronic (platelets down to 57 5 months ago)- will monitor with serial CBCs -Repeat CBC was ordered for p.m. labs yesterday; will be obtained this evening  4. Discharge Planning:              -- Social work and case management to assist with discharge planning and identification of hospital follow-up needs prior to discharge.              -- Estimated LOS: 7-10 days, pending clinical improvement             -- Discharge Concerns: Need to establish a safety plan; Medication compliance and effectiveness; pending OT evaluation for housing recommendations             -- Discharge Goals: Return to hotel with outpatient referrals for mental health  follow-up including medication management/psychotherapy   Maida Sale, MD 07/03/2021, 3:29 PM

## 2021-07-03 NOTE — Progress Notes (Signed)
°   07/03/21 0500  Sleep  Number of Hours 9.5

## 2021-07-03 NOTE — Group Note (Signed)
Date:  07/03/2021 Time:  11:34 AM  Group Topic/Focus:  Goals Group:   The focus of this group is to help patients establish daily goals to achieve during treatment and discuss how the patient can incorporate goal setting into their daily lives to aide in recovery.    Participation Level:  Did Not Attend  [facility] 07/03/2021, 11:34 AM

## 2021-07-04 LAB — CBC
HCT: 41.5 % (ref 36.0–46.0)
Hemoglobin: 13.1 g/dL (ref 12.0–15.0)
MCH: 27.7 pg (ref 26.0–34.0)
MCHC: 31.6 g/dL (ref 30.0–36.0)
MCV: 87.7 fL (ref 80.0–100.0)
Platelets: 72 10*3/uL — ABNORMAL LOW (ref 150–400)
RBC: 4.73 MIL/uL (ref 3.87–5.11)
RDW: 13 % (ref 11.5–15.5)
WBC: 4.3 10*3/uL (ref 4.0–10.5)
nRBC: 0 % (ref 0.0–0.2)

## 2021-07-04 LAB — BASIC METABOLIC PANEL
Anion gap: 7 (ref 5–15)
BUN: 25 mg/dL — ABNORMAL HIGH (ref 6–20)
CO2: 27 mmol/L (ref 22–32)
Calcium: 9.1 mg/dL (ref 8.9–10.3)
Chloride: 104 mmol/L (ref 98–111)
Creatinine, Ser: 0.57 mg/dL (ref 0.44–1.00)
GFR, Estimated: >60 mL/min (ref >60)
Glucose, Bld: 107 mg/dL — ABNORMAL HIGH (ref 70–99)
Potassium: 3.3 mmol/L — ABNORMAL LOW (ref 3.5–5.1)
Sodium: 138 mmol/L (ref 135–145)

## 2021-07-04 MED ORDER — FLUPHENAZINE HCL 5 MG PO TABS
5.0000 mg | ORAL_TABLET | Freq: Three times a day (TID) | ORAL | Status: DC
  Administered 2021-07-04 – 2021-07-07 (×9): 5 mg via ORAL
  Filled 2021-07-04 (×13): qty 1

## 2021-07-04 MED ORDER — ENSURE ENLIVE PO LIQD
237.0000 mL | Freq: Two times a day (BID) | ORAL | Status: DC
  Administered 2021-07-04 – 2021-07-15 (×17): 237 mL via ORAL
  Filled 2021-07-04 (×27): qty 237

## 2021-07-04 MED ORDER — FLUPHENAZINE HCL 2.5 MG/ML IJ SOLN
2.5000 mg | Freq: Three times a day (TID) | INTRAMUSCULAR | Status: DC
  Filled 2021-07-04 (×12): qty 1

## 2021-07-04 NOTE — Group Note (Signed)
[facility] LCSW Group Therapy Note  Date:  07/04/2021   Type of Therapy and Topic:  Group Therapy:  Focus for the New Year  Participation Level:  Did Not Attend   Description of Group:  The focus of this group was to provide patients with an opportunity to think about and discuss what they can work on this coming year that will result in them being happier and healthier one year from today.  It was reviewed how "new year's resolutions" often fade in importance within a few days, so patients were encouraged to think in a broader, more impactful way about what they wish to focus on to change their lives.  Therapeutic Goals Patients discussed in general the benefit of having goals to work on Patients described their own personal goals/focus for the next year that will enable them to be happier and healthier Patients received encouragement from each other and CSW Patients provided support and ideas to each other  Summary of Patient Progress: Patient was invited to group, did not attend.    Therapeutic Modalities Processing   Selmer Dominion, LCSW (led group)  Lahoma Crocker, LCSWA (wrote notes)

## 2021-07-04 NOTE — Progress Notes (Signed)
Pt reported that she ate macaroni, sweet potato fries and veggies for lunch.  Pt said she was full and she did not drink her Ensure that RN administered to pt prior to lunch.  RN encouraged pt to drink Ensure this afternoon.

## 2021-07-04 NOTE — Progress Notes (Signed)
Pt was encouraged but didn't attend orientation/goals group.

## 2021-07-04 NOTE — Progress Notes (Signed)
Pt requested fruit cup and ice cream for snack.  Pt ate both items.

## 2021-07-04 NOTE — Progress Notes (Signed)
°   07/04/21 2130  Psych Admission Type (Psych Patients Only)  Admission Status Involuntary  Psychosocial Assessment  Patient Complaints None  Eye Contact Suspiciousness  Facial Expression Flat  Affect Blunted  Speech Slow;Soft  Interaction Forwards little  Motor Activity Slow  Appearance/Hygiene Disheveled  Behavior Characteristics Cooperative;Guarded  Mood Suspicious  Thought Process  Coherency Concrete thinking  Content Paranoia  Delusions None reported or observed  Perception UTA  Hallucination None reported or observed  Judgment Impaired  Confusion Mild  Danger to Self  Current suicidal ideation? Denies  Danger to Others  Danger to Others None reported or observed   Pt was sleeping at beginning of shift. Woke up about 2130. Pt eyeing this Probation officer suspiciously. Pt denies SI, HI, AVH and pain. Pt asked why she had to take medication this evening. Explained her new medication is every 8 hours. Pt walked slowly to med window. Pt seems slightly irritable but not disrespectful. Pt took evening medication as prescribed. Pt denies anxiety and depression. Pt went back to her room to bed.

## 2021-07-04 NOTE — Progress Notes (Addendum)
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  Case was discussed in the multidisciplinary team. MAR was reviewed and patient was compliant with medications.  She has thus far required IM versions of her scheduled medications due to refusing medications.    Psychiatric Team made the following recommendations yesterday:  -Continue Haldol 5 mg twice daily p.o. or IM and Ativan 0.5 mg twice daily p.o. or IM with forced medication over objection protocol in place - Continue as needed agitation protocol of Haldol 5 mg every 8 hours p.o. or IM, with/ or Ativan 1 mg every 8 hours p.o. or IM and Benadryl 71m po or IM every 6 hours PRN agitation - Continue Cogentin 0.5 mg twice daily PRN - will Check AIMS as she will cooperate for testing  On assessment today patient is up out of her room and walked straight to the phone. Patient ignored provider calling her name and began attempting to call someone. Patient initially refused to tell who she was calling. Patient got the dial tone a few times, but eventually stopped when she got the phone tree for what appeared to be a finance company. Patient finally began acknowledging provider. Patient reported that she was attempting to call [facility] because "I have 2 procedures." Patient was not able to clarify what procedures she was worried about or what they were for. Patient was a a bit irritated that provider was asking her questions, but sat down and continued to talk. RN also came with provider in an attempt to coax patient to take her medications. Patient agreed to take PO Ativan but  adamantly refused PO haldol. Patient reported that she believes the Haldol is giving her nightmares. Patient reports that she is "a child of [name]" and believes that she has "the fullness of the deity in me" and endorses that she believes she a child of [name] similar to [place] not in a culturally normal way. Patient reports that she does not understand why she needs medicine. Patient reports that she has VH " I can see in the realm that y'all don't see " and does not confirm nor deny that she is having VH. Patient does deny SI and HI.   Principal Problem: Schizophrenia spectrum disorder with psychotic disorder type not yet determined ([place] Diagnosis: Principal Problem:   Schizophrenia spectrum disorder with psychotic disorder type not yet determined ([facility] Active Problems:   Brain metastasis ([place]   Melanoma of skin ([place]   Leukopenia   Thrombocytopenia ([place]  Total Time spent with patient: 20 minutes  Past Psychiatric History: see H&P  Past Medical History:  Past Medical History:  Diagnosis Date   Allergy    Anemia    Anemia    Diabetes mellitus without complication ([place]    prediabetic   GERD (gastroesophageal reflux disease)    melanoma with met dz dx'd 01/2018   brain and adrenal   Seasonal allergies     Past Surgical History:  Procedure Laterality Date   BIOPSY  01/28/2018   Procedure: BIOPSY;  Surgeon: ELaurence Spates MD;  Location: [facility] ENDOSCOPY;  Service: Endoscopy;;   BREAST BIOPSY Left  ESOPHAGOGASTRODUODENOSCOPY N/A 01/28/2018   Procedure: ESOPHAGOGASTRODUODENOSCOPY (EGD);  Surgeon: Laurence Spates, MD;  Location: [facility];  Service: Endoscopy;  Laterality: N/A;   MOUTH SURGERY     OVARY SURGERY     removed "something"   Family History:  Family History  Problem Relation Age of Onset   Melanoma Mother    Hypertension Father    Breast cancer Maternal Aunt    Breast cancer Paternal Aunt    Diabetes Paternal Aunt    Breast cancer Maternal Grandmother    Breast  cancer Paternal Grandmother    Diabetes Paternal Grandmother    Colon cancer Neg Hx    Esophageal cancer Neg Hx    Pancreatic cancer Neg Hx    Stomach cancer Neg Hx    Liver disease Neg Hx    Rectal cancer Neg Hx    Family Psychiatric  History: See H&P Social History:  Social History   Substance and Sexual Activity  Alcohol Use No     Social History   Substance and Sexual Activity  Drug Use No    Social History   Socioeconomic History   Marital status: Single    Spouse name: Not on file   Number of children: Not on file   Years of education: Not on file   Highest education level: Not on file  Occupational History   Not on file  Tobacco Use   Smoking status: Never   Smokeless tobacco: Never  Vaping Use   Vaping Use: Never used  Substance and Sexual Activity   Alcohol use: No   Drug use: No   Sexual activity: Not Currently  Other Topics Concern   Not on file  Social History Narrative   Not on file   Social Determinants of Health   Financial Resource Strain: Not on file  Food Insecurity: Not on file  Transportation Needs: Not on file  Physical Activity: Not on file  Stress: Not on file  Social Connections: Not on file   Additional Social History:                         Sleep: Good  Appetite:  Poor  Current Medications: Current Facility-Administered Medications  Medication Dose Route Frequency Provider Last Rate Last Admin   acetaminophen (TYLENOL) tablet 650 mg  650 mg Oral Q6H PRN Rosezetta Schlatter, MD   650 mg at 07/03/21 2050   benztropine (COGENTIN) tablet 0.5 mg  0.5 mg Oral BID PRN Rosezetta Schlatter, MD       diphenhydrAMINE (BENADRYL) capsule 50 mg  50 mg Oral Q6H PRN Damita Dunnings B, MD       Or   diphenhydrAMINE (BENADRYL) injection 50 mg  50 mg Intramuscular Q6H PRN Damita Dunnings B, MD       feeding supplement (ENSURE ENLIVE / ENSURE PLUS) liquid 237 mL  237 mL Oral BID BM Gustaf Mccarter B, MD   237 mL at 07/04/21 1109   fluPHENAZine  (PROLIXIN) tablet 5 mg  5 mg Oral Q8H Leilan Bochenek B, MD   5 mg at 07/04/21 1109   Or   fluPHENAZine (PROLIXIN) injection 2.5 mg  2.5 mg Intramuscular Q8H Keyasia Jolliff B, MD       haloperidol (HALDOL) tablet 5 mg  5 mg Oral Q8H PRN Nelda Marseille, Amy E, MD       Or   haloperidol lactate (HALDOL) injection 5 mg  5 mg Intramuscular Q8H PRN Harlow Asa, MD  LORazepam (ATIVAN) tablet 0.5 mg  0.5 mg Oral BID Nelda Marseille, Amy E, MD   0.5 mg at 07/04/21 1018   Or   LORazepam (ATIVAN) injection 0.5 mg  0.5 mg Intramuscular BID Nelda Marseille, Amy E, MD   0.5 mg at 07/03/21 1808   LORazepam (ATIVAN) tablet 1 mg  1 mg Oral Q8H PRN Harlow Asa, MD       Or   LORazepam (ATIVAN) injection 1 mg  1 mg Intramuscular Q8H PRN Harlow Asa, MD        Lab Results: No results found for this or any previous visit (from the past 31 hour(s)).  Blood Alcohol level:  Lab Results  Component Value Date   ETH <10 06/25/2021   ETH <10 40/97/3532    Metabolic Disorder Labs: Lab Results  Component Value Date   HGBA1C 5.1 06/28/2021   MPG 99.67 06/28/2021   MPG 119.76 02/03/2018   No results found for: PROLACTIN Lab Results  Component Value Date   CHOL 212 (H) 06/28/2021   TRIG 56 06/28/2021   HDL 63 06/28/2021   CHOLHDL 3.4 06/28/2021   VLDL 11 06/28/2021   LDLCALC 138 (H) 06/28/2021   LDLCALC 91 02/03/2018    Physical Findings: AIMS: Facial and Oral Movements Muscles of Facial Expression: None, normal Lips and Perioral Area: None, normal Jaw: None, normal Tongue: None, normal,Extremity Movements Upper (arms, wrists, hands, fingers): None, normal Lower (legs, knees, ankles, toes): None, normal, Trunk Movements Neck, shoulders, hips: None, normal, Overall Severity Severity of abnormal movements (highest score from questions above): None, normal Incapacitation due to abnormal movements: None, normal Patient's awareness of abnormal movements (rate only patient's report): No Awareness,  Dental Status Current problems with teeth and/or dentures?: No Does patient usually wear dentures?: No  CIWA:    COWS:     Musculoskeletal: Strength & Muscle Tone: within normal limits Gait & Station: normal Patient leans: N/A  Psychiatric Specialty Exam:  Presentation  General Appearance: Bizarre; Disheveled  Eye Contact:Minimal  Speech:Clear and Coherent  Speech Volume:Increased  Handedness:Right   Mood and Affect  Mood:Irritable  Affect:Blunt   Thought Process  Thought Processes:Disorganized  Descriptions of Associations:Circumstantial  Orientation:Partial  Thought Content:Illogical; Delusions; Perseveration  History of Schizophrenia/Schizoaffective disorder:Yes  Duration of Psychotic Symptoms:Greater than six months  Hallucinations:Hallucinations: Auditory; Visual  Ideas of Reference:Delusions  Suicidal Thoughts:Suicidal Thoughts: No  Homicidal Thoughts:Homicidal Thoughts: No   Sensorium  Memory:Recent Poor; Immediate Poor  Judgment:Impaired  Insight:None   Executive Functions  Concentration:Poor  Attention Span:Poor  Recall:Poor  Fund of Knowledge:Poor  Language:Fair   Psychomotor Activity  Psychomotor Activity:Psychomotor Activity: Decreased   Assets  Assets:Resilience; Armed forces logistics/support/administrative officer; Social Support   Sleep  Sleep:Sleep: Fair Number of Hours of Sleep: 9    Physical Exam: Physical Exam Constitutional:      Appearance: Normal appearance.  HENT:     Head: Normocephalic and atraumatic.  Pulmonary:     Effort: Pulmonary effort is normal.  Neurological:     Mental Status: She is alert.   Review of Systems  Psychiatric/Behavioral:  Positive for hallucinations. Negative for suicidal ideas.   Blood pressure 128/88, pulse 88, temperature 97.6 F (36.4 C), resp. rate 18, height _0  (1.549 m), weight 56 kg, SpO2 99 %. Body mass index is 23.32 kg/m.   Treatment Plan Summary: Daily contact with patient to  assess and evaluate symptoms and progress in treatment and Medication management  The patient is a 50 year old female with a reported history of  bipolar disorder and unspecified psychosis, and a medical history of melanoma with metastases to the brain, admitted under IVC for bizarre behaviors, agitation, delusions, and hyperreligiosity. Patient continues to endorse these symptoms; however patient was able to clearly explain why she does not like to take Haldol and was willing to take Ativan PO today.      Safety and Monitoring: INVOLUTARILY (by [employer]) admission to inpatient psychiatric unit for safety, stabilization and treatment Daily contact with patient to assess and evaluate symptoms and progress in treatment Patient's case to be discussed in multi-disciplinary team meeting Observation Level : q15 minute checks Vital signs: q12 hours Precautions: suicide, elopement, and assault   2. Psychiatric Problems #Unspecified schizophrenia spectrum and other psychotic d/o (r/o psychosis secondary to general medical condition - metatstatic melanoma vs bipolar d/o MRE manic with psychotic features) Notes from previous [facility] visit report some baseline delusions, hyperreligiosity in 01/2021, with problems ongoing since 09/2020, per patient's reporting.  - Discontinue Haldol 5 mg twice daily p.o. or IM  -- Start prolixin 3m PO TID or 2.571mIM q8h, attempted to reach guardian; however not able to leave VM on cell, due to patient requiring medication and reporting adverse side effects (nightmares ) leading to forced meds with Haldol in the best interest of patient health have started in an effort to decrease patient's danger to herself and others. Did leave a VM on work phone.  -- Continue Ativan 0.5 mg twice daily p.o. or IM with forced medication over objection protocol in place - Continue as needed agitation protocol of Haldol 5 mg every 8 hours p.o. or IM, with/ or Ativan 1 mg every 8 hours p.o. or IM and  Benadryl 5049mo or IM every 6 hours PRN agitation - Continue Cogentin 0.5 mg twice daily PRN - will Check AIMS as she will cooperate for testing   3. Medical Management Covid negative CMP: Calcium 8.4 CBC: WBC 3.9, H/H 11.7/37.1, platelets 60, ANC 2700 EtOH: <10 UDS: Negative TSH: 2.774 A1C: 5.1 Lipids: Cholesterol 212, LDL 138 QTC 424m17mepeating BMP due to poor PO intake - Ensure    #History of metastatic melanoma Metastases to the brain and adrenals first reported in 2019, and patient had full brain irradiation around that time.  Patient was with regular follow-up until April of this year, and was lost to follow-up for her July appointment and beyond. - Reached out to oncology: Per Dr. GudeLindi Adierify with guardian whether she wants to go through treatment. If she wants to go through treatment, she will need a Whole body scan and to follow up in clinic outpatient; if not, consider hospice. Obtain CT CAP with contrast and CT head with and without contrast today for surveillance; discussed with the patient today and will discuss with guardian that patient requests outpatient follow-up for surveillance.                 #Thrombocytopenia, improving #Leukopenia, resolved Platelets 60 (71 on repeat); ANC 2700 (3600 on repeat) and WBC 3.9 (5.1 on repeat) Appears chronic (platelets down to 57 5 months ago)- will monitor with serial CBCs -Repeat CBC was ordered for p.m. labs yesterday; will be obtained this evening   4. Discharge Planning:              -- Social work and case management to assist with discharge planning and identification of hospital follow-up needs prior to discharge.              -- Estimated  LOS: 7-10 days, pending clinical improvement             -- Discharge Concerns: Need to establish a safety plan; Medication compliance and effectiveness; pending OT evaluation for housing recommendations   PGY-2 Freida Busman, MD 07/04/2021, 1:22 PM

## 2021-07-04 NOTE — Progress Notes (Signed)
Kaitlyn Duncan refused labs this morning. "I'm not doing any labs, you should already know that by now."

## 2021-07-04 NOTE — Progress Notes (Signed)
Pt drank 8 oz of water

## 2021-07-04 NOTE — Progress Notes (Signed)
Psychoeducational Group Note  Date:  07/04/2021 Time:  2015  Group Topic/Focus:  Wrap up group  Participation Level: Did Not Attend  Participation Quality:  Not Applicable  Affect:  Not Applicable  Cognitive:  Not Applicable  Insight:  Not Applicable  Engagement in Group: Not Applicable  Additional Comments:  Did not attend. Pt was asleep in bed.   Shellia Cleverly 07/04/2021, 8:38 PM

## 2021-07-04 NOTE — Progress Notes (Signed)
Pt presents with delusions, and she is hyper-religious and disorganized.  Pt was able to verbalize that Haldol was giving her bad dreams. Haldol dc'd and  Prolixin prescribed.  Pt took medication by mouth without incident.

## 2021-07-05 DIAGNOSIS — F29 Unspecified psychosis not due to a substance or known physiological condition: Secondary | ICD-10-CM | POA: Diagnosis not present

## 2021-07-05 NOTE — Progress Notes (Signed)
Pt continues to isolate in her room.  Pt denied SI/HI/AVH.  Pt is asking about her discharge date saying, "I was told I'm leaving tomorrow."    Pt is much more cooperative this shift than yesterday and she has been calm and taken medication with some explanation and redirection.  Pt eating meals and drinking Ensure shakes.    RN will continue to monitor pt's progress and provide assistance as needed.

## 2021-07-05 NOTE — Progress Notes (Signed)
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   Case was discussed in the multidisciplinary team. MAR was reviewed and patient was compliant with medications.  She began taking PO version of her rx and is eating per RN.   Psychiatric Team made the following recommendations yesterday: - Discontinue Haldol 5 mg twice daily p.o. or IM  -- Start prolixin 48m PO TID or 2.563mIM q8h, attempted to reach guardian; however not able to leave VM on cell, due to patient requiring medication and reporting adverse side effects (nightmares ) leading to forced meds with Haldol in the best interest of patient health have started in an effort to decrease patient's danger to herself and others. Did leave a VM on work phone.  -- Continue Ativan 0.5 mg twice daily p.o. or IM with forced medication over objection protocol in place - Continue as needed agitation protocol of Haldol 5 mg every 8 hours p.o. or IM, with/ or Ativan 1 mg every 8 hours p.o. or IM and Benadryl 50109mo or IM every 6 hours PRN agitation - Continue Cogentin 0.5 mg twice daily PRN - will Check AIMS as she will cooperate for testing   Patient did not come out of her room today and was found by provider resting in bed but awake. Patient reports that she would like to leave and is upset that she has been in the hospital over 1 week. Patient reports that "an [place] doctor" had planned to discharge her but " Dr. SinNelda Marseilleme and changed it!" Patient endorses that she is upset about this and has no idea why she had to remain in the hospital. Patient is very  preoccupied with this. Patient reports that she may be having AH but wont give a yes or no answer and instead reports, " I'm a spiritual being, I am a child of [name]."   Patient refuses to answer providers questions about SI, HI and AVH. Patient will not give yes or no and retreats to her bathroom refusing to say anything to the provider other than request to be given "privacy." Patient is noted to be in the bathroom for almost 1 hour flushing the toilet on occasion. RN reported that patient has a habit of doing this. Provider went back later to attempt to ask patients these questions again and patient again refused to answer with a "yes or no" and indicated being irritated and reported, " It's null in void can't you use context clues?" Patient also endorsed that she is highly educated and the questions being asked are menial.   RN was able to endorse patient had denied SI, HI, and AVH with them.   Principal Problem: Schizophrenia spectrum disorder with psychotic disorder type not yet determined (HCCSussexiagnosis: Principal Problem:   Schizophrenia spectrum disorder with psychotic disorder type not yet determined (HCCFarleyctive Problems:   Brain metastasis (HCC)   Melanoma of skin (HCCArthur Leukopenia   Thrombocytopenia (HCCNorth Bennington[place] Time spent with patient: 30 minutes  Past Psychiatric History: See H&P  Past Medical History:  Past Medical History:  Diagnosis Date   Allergy  Anemia    Anemia    Diabetes mellitus without complication ([place])    prediabetic   GERD (gastroesophageal reflux disease)    melanoma with met dz dx'd 01/2018   brain and adrenal   Seasonal allergies     Past Surgical History:  Procedure Laterality Date   BIOPSY  01/28/2018   Procedure: BIOPSY;  Surgeon: Laurence Spates, MD;  Location: [facility];  Service: Endoscopy;;   BREAST BIOPSY Left    ESOPHAGOGASTRODUODENOSCOPY N/A 01/28/2018   Procedure: ESOPHAGOGASTRODUODENOSCOPY (EGD);  Surgeon: Laurence Spates, MD;   Location: [facility];  Service: Endoscopy;  Laterality: N/A;   MOUTH SURGERY     OVARY SURGERY     removed "something"   Family History:  Family History  Problem Relation Age of Onset   Melanoma Mother    Hypertension Father    Breast cancer Maternal Aunt    Breast cancer Paternal Aunt    Diabetes Paternal Aunt    Breast cancer Maternal Grandmother    Breast cancer Paternal Grandmother    Diabetes Paternal Grandmother    Colon cancer Neg Hx    Esophageal cancer Neg Hx    Pancreatic cancer Neg Hx    Stomach cancer Neg Hx    Liver disease Neg Hx    Rectal cancer Neg Hx    Family Psychiatric  History: See H&P Social History:  Social History   Substance and Sexual Activity  Alcohol Use No     Social History   Substance and Sexual Activity  Drug Use No    Social History   Socioeconomic History   Marital status: Single    Spouse name: Not on file   Number of children: Not on file   Years of education: Not on file   Highest education level: Not on file  Occupational History   Not on file  Tobacco Use   Smoking status: Never   Smokeless tobacco: Never  Vaping Use   Vaping Use: Never used  Substance and Sexual Activity   Alcohol use: No   Drug use: No   Sexual activity: Not Currently  Other Topics Concern   Not on file  Social History Narrative   Not on file   Social Determinants of Health   Financial Resource Strain: Not on file  Food Insecurity: Not on file  Transportation Needs: Not on file  Physical Activity: Not on file  Stress: Not on file  Social Connections: Not on file   Additional Social History:                         Sleep: Good  Appetite:  Good  Current Medications: Current Facility-Administered Medications  Medication Dose Route Frequency Provider Last Rate Last Admin   acetaminophen (TYLENOL) tablet 650 mg  650 mg Oral Q6H PRN Rosezetta Schlatter, MD   650 mg at 07/03/21 2050   benztropine (COGENTIN) tablet 0.5 mg  0.5 mg  Oral BID PRN Rosezetta Schlatter, MD       diphenhydrAMINE (BENADRYL) capsule 50 mg  50 mg Oral Q6H PRN Damita Dunnings B, MD       Or   diphenhydrAMINE (BENADRYL) injection 50 mg  50 mg Intramuscular Q6H PRN Damita Dunnings B, MD       feeding supplement (ENSURE ENLIVE / ENSURE PLUS) liquid 237 mL  237 mL Oral BID BM Kaitlyn Duncan B, MD   237 mL at 07/05/21 1013   fluPHENAZine (PROLIXIN) tablet 5 mg  5 mg Oral Q8H Damita Dunnings B, MD   5 mg at 07/05/21 0881   Or   fluPHENAZine (PROLIXIN) injection 2.5 mg  2.5 mg Intramuscular Q8H , Joyce Gross B, MD       haloperidol (HALDOL) tablet 5 mg  5 mg Oral Q8H PRN Harlow Asa, MD       Or   haloperidol lactate (HALDOL) injection 5 mg  5 mg Intramuscular Q8H PRN Harlow Asa, MD       LORazepam (ATIVAN) tablet 0.5 mg  0.5 mg Oral BID Nelda Marseille, Amy E, MD   0.5 mg at 07/05/21 1013   Or   LORazepam (ATIVAN) injection 0.5 mg  0.5 mg Intramuscular BID Nelda Marseille, Amy E, MD   0.5 mg at 07/03/21 1808   LORazepam (ATIVAN) tablet 1 mg  1 mg Oral Q8H PRN Harlow Asa, MD       Or   LORazepam (ATIVAN) injection 1 mg  1 mg Intramuscular Q8H PRN Harlow Asa, MD        Lab Results:  Results for orders placed or performed during the hospital encounter of 06/26/21 (from the past 48 hour(s))  Basic metabolic panel     Status: Abnormal   Collection Time: 07/04/21  6:32 PM  Result Value Ref Range   Sodium 138 135 - 145 mmol/L   Potassium 3.3 (L) 3.5 - 5.1 mmol/L   Chloride 104 98 - 111 mmol/L   CO2 27 22 - 32 mmol/L   Glucose, Bld 107 (H) 70 - 99 mg/dL    Comment: Glucose reference range applies only to samples taken after fasting for at least 8 hours.   BUN 25 (H) 6 - 20 mg/dL   Creatinine, Ser 0.57 0.44 - 1.00 mg/dL   Calcium 9.1 8.9 - 10.3 mg/dL   GFR, Estimated >60 >60 mL/min    Comment: (NOTE) Calculated using the CKD-EPI Creatinine Equation (2021)    Anion gap 7 5 - 15    Comment: Performed at [facility], [place]  [address], [place], [place] [address]  CBC     Status: Abnormal   Collection Time: 07/04/21  6:32 PM  Result Value Ref Range   WBC 4.3 4.0 - 10.5 K/uL   RBC 4.73 3.87 - 5.11 MIL/uL   Hemoglobin 13.1 12.0 - 15.0 g/dL   HCT 41.5 36.0 - 46.0 %   MCV 87.7 80.0 - 100.0 fL   MCH 27.7 26.0 - 34.0 pg   MCHC 31.6 30.0 - 36.0 g/dL   RDW 13.0 11.5 - 15.5 %   Platelets 72 (L) 150 - 400 K/uL    Comment: Immature Platelet Fraction may be clinically indicated, consider ordering this additional test [id number] CONSISTENT WITH PREVIOUS RESULT REPEATED TO VERIFY    nRBC 0.0 0.0 - 0.2 %    Comment: Performed at [facility], [place] [address] [place], [place] [address]    Blood Alcohol level:  Lab Results  Component Value Date   [facility] <10 06/25/2021   ETH <10 86/38/1771    Metabolic Disorder Labs: Lab Results  Component Value Date   HGBA1C 5.1 06/28/2021   MPG 99.67 06/28/2021   MPG 119.76 02/03/2018   No results found for: PROLACTIN Lab Results  Component Value Date   CHOL 212 (H) 06/28/2021   TRIG 56 06/28/2021   HDL 63 06/28/2021   CHOLHDL 3.4 06/28/2021   VLDL 11 06/28/2021   LDLCALC 138 (H) 06/28/2021   [place]  91 02/03/2018    Physical Findings: AIMS: Facial and Oral Movements Muscles of Facial Expression: None, normal Lips and Perioral Area: None, normal Jaw: None, normal Tongue: None, normal,Extremity Movements Upper (arms, wrists, hands, fingers): None, normal Lower (legs, knees, ankles, toes): None, normal, Trunk Movements Neck, shoulders, hips: None, normal, Overall Severity Severity of abnormal movements (highest score from questions above): None, normal Incapacitation due to abnormal movements: None, normal Patient's awareness of abnormal movements (rate only patient's report): No Awareness, Dental Status Current problems with teeth and/or dentures?: No Does patient usually wear dentures?: No  CIWA:    COWS:     Musculoskeletal: Strength &  Muscle Tone: within normal limits Gait & Station: normal Patient leans: N/A  Psychiatric Specialty Exam:  Presentation  General Appearance: Disheveled  Eye Contact:Minimal  Speech:Clear and Coherent; Pressured  Speech Volume:Increased  Handedness:Right   Mood and Affect  Mood:Irritable  Affect:Congruent   Thought Process  Thought Processes:Goal Directed  Descriptions of Associations:Circumstantial  Orientation:Full (Time, Place and Person)  Thought Content:Rumination  History of Schizophrenia/Schizoaffective disorder:Yes  Duration of Psychotic Symptoms:Greater than six months  Hallucinations:Hallucinations: -- (refuses to tell provider, but says "null in void" and denied to other staff today)  Ideas of Reference:Delusions  Suicidal Thoughts:Suicidal Thoughts: -- (refuses to tell provider, but says "null in void" and denied to other staff today)  Homicidal Thoughts:Homicidal Thoughts: -- (refuses to tell provider, but says "null in void" and denied to other staff today)   Sensorium  Memory:Immediate Poor; Recent Poor  Judgment:Impaired  Insight:None   Executive Functions  Concentration:Poor  Attention Span:Poor  Recall:Poor  [employer] of Knowledge:Poor  Language:Poor   Psychomotor Activity  Psychomotor Activity:Psychomotor Activity: Decreased   Assets  Assets:Resilience   Sleep  Sleep:Sleep: Good    Physical Exam: Physical Exam HENT:     Head: Normocephalic and atraumatic.  Pulmonary:     Effort: Pulmonary effort is normal.  Neurological:     Mental Status: She is alert and oriented to person, place, and time.   Review of Systems  Psychiatric/Behavioral:  The patient does not have insomnia.   Blood pressure 134/90, pulse 84, temperature 97.6 F (36.4 C), resp. rate 18, height 5' 1" (1.549 m), weight 56 kg, SpO2 99 %. Body mass index is 23.32 kg/m.   Treatment Plan Summary: Daily contact with patient to assess and evaluate  symptoms and progress in treatment and Medication management The patient is a 51 year old female with a reported history of bipolar disorder and unspecified psychosis, and a medical history of melanoma with metastases to the brain, admitted under IVC for bizarre behaviors, agitation, delusions, and hyperreligiosity. Patient continues to endorse these symptoms; but has started taking her PO medications. Patient's bathroom behavior is concerning as she is more isolative and refusing to participate in assessments. Patient's event leading to her hospitalization included barricading her self in her bathroom. RN endorsed he will be checking her bathroom to make sure patient is not doing anything dangerous or bizarre. Also unsure of where patient's sudden fixation on a Previous plan to discharge? Is coming from, as there was never any previous plans to discharge patient. Today will be patient's first day with full dose of 80m TID Prolixin. Will continue to monitor.    Safety and Monitoring: INVOLUTARILY (by [employer]) admission to inpatient psychiatric unit for safety, stabilization and treatment Daily contact with patient to assess and evaluate symptoms and progress in treatment Patient's case to be discussed in multi-disciplinary team meeting Observation Level : q15  minute checks Vital signs: q12 hours Precautions: suicide, elopement, and assault   2. Psychiatric Problems #Unspecified schizophrenia spectrum and other psychotic d/o (r/o psychosis secondary to general medical condition - metatstatic melanoma vs bipolar d/o MRE manic with psychotic features) Notes from previous [place] visit report some baseline delusions, hyperreligiosity in 01/2021, with problems ongoing since 09/2020, per patient's reporting.  - Discontinue Haldol 5 mg twice daily p.o. or IM  -- Continue prolixin 102m PO TID or 2.513mIM q8h, attempted to reach guardian; however not able to leave VM on cell, due to patient requiring medication and  reporting adverse side effects (nightmares ) leading to forced meds with Haldol in the best interest of patient health have started in an effort to decrease patient's danger to herself and others. Did leave a VM on work phone.  -- Continue Ativan 0.5 mg twice daily p.o. or IM with forced medication over objection protocol in place - Continue as needed agitation protocol of Haldol 5 mg every 8 hours p.o. or IM, with/ or Ativan 1 mg every 8 hours p.o. or IM and Benadryl 5039mo or IM every 6 hours PRN agitation - Continue Cogentin 0.5 mg twice daily PRN - will Check AIMS as she will cooperate for testing   3. Medical Management Covid negative CMP: Calcium 8.4 CBC: WBC 3.9, H/H 11.7/37.1, platelets 60, ANC 2700 EtOH: <10 UDS: Negative TSH: 2.774 A1C: 5.1 Lipids: Cholesterol 212, LDL 138 QTC 424m70m+ 3.3 PO Intake is improving, will recheck BMP in 2 days - Ensure    #History of metastatic melanoma Metastases to the brain and adrenals first reported in 2019, and patient had full brain irradiation around that time.  Patient was with regular follow-up until April of this year, and was lost to follow-up for her July appointment and beyond. - Reached out to oncology: Per Dr. GudeLindi Adierify with guardian whether she wants to go through treatment. If she wants to go through treatment, she will need a Whole body scan and to follow up in clinic outpatient; if not, consider hospice. Obtain CT CAP with contrast and CT head with and without contrast today for surveillance; discussed with the patient today and will discuss with guardian that patient requests outpatient follow-up for surveillance.                 #Thrombocytopenia, improving #Leukopenia, resolved Platelets 60 (71 on repeat); ANC 2700 (3600 on repeat) and WBC 3.9 (5.1 on repeat) Appears chronic (platelets down to 57 5 months ago)- will monitor with serial CBCs -Repeat CBC was ordered for p.m. labs yesterday; will be obtained this evening    4. Discharge Planning:              -- Social work and case management to assist with discharge planning and identification of hospital follow-up needs prior to discharge.              -- Estimated LOS: 7-10 days, pending clinical improvement             -- Discharge Concerns: Need to establish a safety plan; Medication compliance and effectiveness; pending OT evaluation for housing recommendations     PGY-2 [place] Freida Busman 07/05/2021, 12:12 PM

## 2021-07-05 NOTE — Progress Notes (Signed)
Pt refused vital signs this morning. Didn't want to go to breakfast.

## 2021-07-05 NOTE — Group Note (Signed)
°  Group note already done, entered by accident

## 2021-07-05 NOTE — Group Note (Signed)
[facility] Group Notes: (Clinical Social Work)   07/05/2021      Type of Therapy:  Group Therapy   Participation Level:  Did Not Attend - was invited both individually by MHT and by overhead announcement, chose not to attend.   Selmer Dominion, LCSW 07/05/2021, 2:29 PM

## 2021-07-05 NOTE — Progress Notes (Signed)
Adult Psychoeducational Group Note  Date:  07/05/2021 Time:  9:06 PM  Group Topic/Focus:  Wrap-Up Group:   The focus of this group is to help patients review their daily goal of treatment and discuss progress on daily workbooks.  Participation Level:  Did Not Attend  Participation Quality:   Did Not Attend  Affect:   Did Not Attend  Cognitive:   Did Not Attend  Insight: None  Engagement in Group:   Did Not Attend  Modes of Intervention:   Did Not Attend  Additional Comments:  Pt did not attend evening wrap up tonight.  [name] 07/05/2021, 9:06 PM

## 2021-07-06 ENCOUNTER — Encounter: Payer: Self-pay | Admitting: Oncology

## 2021-07-06 NOTE — Progress Notes (Signed)
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   Case was discussed in the multidisciplinary team. MAR was reviewed and patient was compliant with medications.    Psychiatric Team made the following recommendations yesterday: -- Continue prolixin 52m PO TID or 2.539mIM q8h, attempted to reach guardian; however not able to leave VM on cell, due to patient requiring medication and reporting adverse side effects (nightmares ) leading to forced meds with Haldol in the best interest of patient health have started in an effort to decrease patient's danger to herself and others. Did leave a VM on work phone.  -- Continue Ativan 0.5 mg twice daily p.o. or IM with forced medication over objection protocol in place - Continue as needed agitation protocol of Haldol 5 mg every 8 hours p.o. or IM, with/ or Ativan 1 mg every 8 hours p.o. or IM and Benadryl 5014mo or IM every 6 hours PRN agitation - Continue Cogentin 0.5 mg twice daily PRN - will Check AIMS as she will cooperate for testing  On assessment today provider catches patient as she is leaving her bathroom. Patient is willing to sit down and talk to provider. Patient reports that she is aware that she has been using the bathroom more and reports that she is having BMs. Patient denies abnormal color or accidents.  Patient reports that she would like to leave and believes that she is being kept "illegally." Patient made aware that her guardian is responsible for safety planning with the hospital. Patient reports that she does not need a legal guardian because "I am above the  law, I have a WorKassonPatient then begins talking about how "tele- Evangelist" do not need legal guardians and she would like her guardian removed. Patient reports that she is " A child of [name] and adverse to legalism." Patient denies SI, HI, and VH today. Patient reports that she does talk to spirits of dead relatives and endorses AH. Patient reports she is bored and argues that there is no reason to leave her room. Provider spoke with patient about her reluctance to leave her room and not participating in group therapy. Patient then followed provider out of room and remained in the hallway the rest of the AM.   Principal Problem: Schizophrenia spectrum disorder with psychotic disorder type not yet determined (HCCSadleriagnosis: Principal Problem:   Schizophrenia spectrum disorder with psychotic disorder type not yet determined (HCCPrescottctive Problems:   Brain metastasis (HCCWarren Melanoma of skin (HCCStrang Leukopenia   Thrombocytopenia (HCCPortageTotal Time spent with patient: 15 minutes  Past Psychiatric History: See H&P  Past Medical History:  Past Medical History:  Diagnosis Date   Allergy    Anemia    Anemia    Diabetes mellitus without complication (HCCRock  prediabetic   GERD (gastroesophageal reflux disease)    melanoma with met dz dx'd 01/2018   brain and adrenal   Seasonal allergies     Past Surgical History:  Procedure Laterality Date   BIOPSY  01/28/2018   Procedure: BIOPSY;  Surgeon: EdwLaurence SpatesD;  [facility];  Service: Endoscopy;;   BREAST BIOPSY Left    ESOPHAGOGASTRODUODENOSCOPY N/A 01/28/2018   Procedure: ESOPHAGOGASTRODUODENOSCOPY (EGD);  Surgeon: Laurence Spates, MD;  [facility];  Service: Endoscopy;  Laterality: N/A;   MOUTH SURGERY     OVARY SURGERY     removed "something"   Family History:  Family History  Problem Relation Age of Onset   Melanoma Mother    Hypertension Father    Breast cancer Maternal Aunt    Breast  cancer Paternal Aunt    Diabetes Paternal Aunt    Breast cancer Maternal Grandmother    Breast cancer Paternal Grandmother    Diabetes Paternal Grandmother    Colon cancer Neg Hx    Esophageal cancer Neg Hx    Pancreatic cancer Neg Hx    Stomach cancer Neg Hx    Liver disease Neg Hx    Rectal cancer Neg Hx    Family Psychiatric  History: See H&P Social History:  Social History   Substance and Sexual Activity  Alcohol Use No     Social History   Substance and Sexual Activity  Drug Use No    Social History   Socioeconomic History   Marital status: Single    Spouse name: Not on file   Number of children: Not on file   Years of education: Not on file   Highest education level: Not on file  Occupational History   Not on file  Tobacco Use   Smoking status: Never   Smokeless tobacco: Never  Vaping Use   Vaping Use: Never used  Substance and Sexual Activity   Alcohol use: No   Drug use: No   Sexual activity: Not Currently  Other Topics Concern   Not on file  Social History Narrative   Not on file   Social Determinants of Health   Financial Resource Strain: Not on file  Food Insecurity: Not on file  Transportation Needs: Not on file  Physical Activity: Not on file  Stress: Not on file  Social Connections: Not on file   Additional Social History:                         Sleep: Good  Appetite:  Good  Current Medications: Current Facility-Administered Medications  Medication Dose Route Frequency Provider Last Rate Last Admin   acetaminophen (TYLENOL) tablet 650 mg  650 mg Oral Q6H PRN Rosezetta Schlatter, MD   650 mg at 07/03/21 2050   benztropine (COGENTIN) tablet 0.5 mg  0.5 mg Oral BID PRN Rosezetta Schlatter, MD       diphenhydrAMINE (BENADRYL) capsule 50 mg  50 mg Oral Q6H PRN Damita Dunnings B, MD       Or   diphenhydrAMINE (BENADRYL) injection 50 mg  50 mg Intramuscular Q6H PRN Damita Dunnings B, MD       feeding supplement (ENSURE ENLIVE / ENSURE  PLUS) liquid 237 mL  237 mL Oral BID BM Aija Scarfo B, MD   237 mL at 07/05/21 1531   fluPHENAZine (PROLIXIN) tablet 5 mg  5 mg Oral Q8H Khiley Lieser, Joyce Gross B, MD   5 mg at 07/06/21 8469   Or   fluPHENAZine (PROLIXIN) injection 2.5 mg  2.5 mg Intramuscular Q8H Tennyson Wacha B, MD       haloperidol (HALDOL) tablet 5 mg  5 mg Oral Q8H PRN Nelda Marseille, Amy E, MD       Or   haloperidol lactate (HALDOL) injection 5  mg  5 mg Intramuscular Q8H PRN Harlow Asa, MD       LORazepam (ATIVAN) tablet 0.5 mg  0.5 mg Oral BID Nelda Marseille, Amy E, MD   0.5 mg at 07/06/21 2376   Or   LORazepam (ATIVAN) injection 0.5 mg  0.5 mg Intramuscular BID Nelda Marseille, Amy E, MD   0.5 mg at 07/03/21 1808   LORazepam (ATIVAN) tablet 1 mg  1 mg Oral Q8H PRN Harlow Asa, MD       Or   LORazepam (ATIVAN) injection 1 mg  1 mg Intramuscular Q8H PRN Harlow Asa, MD        Lab Results:  Results for orders placed or performed during the hospital encounter of 06/26/21 (from the past 48 hour(s))  Basic metabolic panel     Status: Abnormal   Collection Time: 07/04/21  6:32 PM  Result Value Ref Range   Sodium 138 135 - 145 mmol/L   Potassium 3.3 (L) 3.5 - 5.1 mmol/L   Chloride 104 98 - 111 mmol/L   CO2 27 22 - 32 mmol/L   Glucose, Bld 107 (H) 70 - 99 mg/dL    Comment: Glucose reference range applies only to samples taken after fasting for at least 8 hours.   BUN 25 (H) 6 - 20 mg/dL   Creatinine, Ser 0.57 0.44 - 1.00 mg/dL   Calcium 9.1 8.9 - 10.3 mg/dL   GFR, Estimated >60 >60 mL/min    Comment: (NOTE) Calculated using the CKD-EPI Creatinine Equation (2021)    Anion gap 7 5 - 15    Comment: Performed at [facility], [place] [address] [place], [place] [address]  CBC     Status: Abnormal   Collection Time: 07/04/21  6:32 PM  Result Value Ref Range   WBC 4.3 4.0 - 10.5 K/uL   RBC 4.73 3.87 - 5.11 MIL/uL   Hemoglobin 13.1 12.0 - 15.0 g/dL   HCT 41.5 36.0 - 46.0 %   MCV 87.7 80.0 - 100.0 fL   MCH  27.7 26.0 - 34.0 pg   MCHC 31.6 30.0 - 36.0 g/dL   RDW 13.0 11.5 - 15.5 %   Platelets 72 (L) 150 - 400 K/uL    Comment: Immature Platelet Fraction may be clinically indicated, consider ordering this additional test [id number] CONSISTENT WITH PREVIOUS RESULT REPEATED TO VERIFY    nRBC 0.0 0.0 - 0.2 %    Comment: Performed at [facility], [place] [address], [place], [place] [address]    Blood Alcohol level:  Lab Results  Component Value Date   [facility] <10 06/25/2021   ETH <10 69/48/5462    Metabolic Disorder Labs: Lab Results  Component Value Date   HGBA1C 5.1 06/28/2021   MPG 99.67 06/28/2021   MPG 119.76 02/03/2018   No results found for: PROLACTIN Lab Results  Component Value Date   CHOL 212 (H) 06/28/2021   TRIG 56 06/28/2021   HDL 63 06/28/2021   CHOLHDL 3.4 06/28/2021   VLDL 11 06/28/2021   LDLCALC 138 (H) 06/28/2021   LDLCALC 91 02/03/2018    Physical Findings: AIMS: Facial and Oral Movements Muscles of Facial Expression: None, normal Lips and Perioral Area: None, normal Jaw: None, normal Tongue: None, normal,Extremity Movements Upper (arms, wrists, hands, fingers): None, normal Lower (legs, knees, ankles, toes): None, normal, Trunk Movements Neck, shoulders, hips: None, normal, Overall Severity Severity of abnormal movements (highest score from questions above): None, normal Incapacitation due to abnormal  movements: None, normal Patient's awareness of abnormal movements (rate only patient's report): No Awareness, Dental Status Current problems with teeth and/or dentures?: No Does patient usually wear dentures?: No  CIWA:    COWS:     Musculoskeletal: Strength & Muscle Tone: within normal limits Gait & Station: normal Patient leans: N/A  Psychiatric Specialty Exam:  Presentation  General Appearance: Bizarre  Eye Contact:Fair  Speech:Clear and Coherent; Pressured  Speech Volume:Increased  Handedness:Right   Mood and Affect   Mood:Irritable  Affect:Blunt   Thought Process  Thought Processes:Linear  Descriptions of Associations:Circumstantial  Orientation:Full (Time, Place and Person)  Thought Content:Illogical; Delusions; Perseveration  History of Schizophrenia/Schizoaffective disorder:Yes  Duration of Psychotic Symptoms:Greater than six months  Hallucinations:Hallucinations: Auditory Description of Auditory Hallucinations: hearing from [name]  Ideas of Reference:Delusions  Suicidal Thoughts:Suicidal Thoughts: No  Homicidal Thoughts:Homicidal Thoughts: No   Sensorium  Memory:Immediate Poor; Recent Poor  Judgment:Impaired  Insight:None   Executive Functions  Concentration:Fair  Attention Span:Poor  Recall:Poor  Fund of Knowledge:Poor  Language:Poor   Psychomotor Activity  Psychomotor Activity:Psychomotor Activity: Normal   Assets  Assets:Resilience   Sleep  Sleep:Sleep: Good    Physical Exam: Physical Exam HENT:     Head: Normocephalic and atraumatic.  Pulmonary:     Effort: Pulmonary effort is normal.  Neurological:     Mental Status: She is alert.   Review of Systems  Psychiatric/Behavioral:  Negative for depression, hallucinations and suicidal ideas. The patient does not have insomnia.   Blood pressure 134/90, pulse 84, temperature 97.6 F (36.4 C), resp. rate 18, height _0  (1.549 m), weight 56 kg, SpO2 99 %. Body mass index is 23.32 kg/m.   Treatment Plan Summary: Daily contact with patient to assess and evaluate symptoms and progress in treatment and Medication management The patient is a 51 year old female with a reported history of bipolar disorder and unspecified psychosis, and a medical history of melanoma with metastases to the brain, admitted under IVC for bizarre behaviors, agitation, delusions, and hyperreligiosity. Patient continues to endorse delusions and hyperreligiousity, these delusions may be hard to get rid of due to patient's hx of non  compliance and schizophrenia.  Patient was more goal oriented today and less avoidant. At this time not especially concerned about patient's reported increased Bms as patient appears to more hyperfixated on the bathroom and may be conflating her hyperfixation with previous GI issues or attempting to model behavior she believes will get her transferred out of the [facility]. Patient is eating well and is not endorsing physical pain or abnormal colored stools or BRBPR. Safety and Monitoring: INVOLUTARILY (by [employer]) admission to inpatient psychiatric unit for safety, stabilization and treatment Daily contact with patient to assess and evaluate symptoms and progress in treatment Patient's case to be discussed in multi-disciplinary team meeting Observation Level : q15 minute checks Vital signs: q12 hours Precautions: suicide, elopement, and assault   2. Psychiatric Problems #Unspecified schizophrenia spectrum and other psychotic d/o (r/o psychosis secondary to general medical condition - metatstatic melanoma vs bipolar d/o MRE manic with psychotic features) Notes from previous [place] visit report some baseline delusions, hyperreligiosity in 01/2021, with problems ongoing since 09/2020, per patient's reporting.  - Discontinue Haldol 5 mg twice daily p.o. or IM  -- Continue prolixin 85m PO TID or 2.510mIM q8h -- Continue Ativan 0.5 mg twice daily p.o. or IM with forced medication over objection protocol in place - Continue as needed agitation protocol of Haldol 5 mg every 8 hours p.o. or IM,  with/ or Ativan 1 mg every 8 hours p.o. or IM and Benadryl 58m po or IM every 6 hours PRN agitation - Continue Cogentin 0.5 mg twice daily PRN - will Check AIMS as she will cooperate for testing   3. Medical Management Covid negative CMP: Calcium 8.4 CBC: WBC 3.9, H/H 11.7/37.1, platelets 60, ANC 2700 EtOH: <10 UDS: Negative TSH: 2.774 A1C: 5.1 Lipids: Cholesterol 212, LDL 138 QTC 4234m-K+ 3.3 PO Intake is improving,  will recheck BMP in 2 days - Ensure    #History of metastatic melanoma Metastases to the brain and adrenals first reported in 2019, and patient had full brain irradiation around that time.  Patient was with regular follow-up until April of this year, and was lost to follow-up for her July appointment and beyond. - Reached out to oncology: Per Dr. GuLindi AdieVerify with guardian whether she wants to go through treatment. If she wants to go through treatment, she will need a Whole body scan and to follow up in clinic outpatient; if not, consider hospice. Obtain CT CAP with contrast and CT head with and without contrast today for surveillance; discussed with the patient today and will discuss with guardian that patient requests outpatient follow-up for surveillance.                 #Thrombocytopenia, improving #Leukopenia, resolved Platelets 60 (71 on repeat); ANC 2700 (3600 on repeat) and WBC 3.9 (5.1 on repeat) Appears chronic (platelets down to 57 5 months ago)- will monitor with serial CBCs -Repeat CBC was ordered for p.m. labs yesterday; will be obtained this evening   4. Discharge Planning:              -- Social work and case management to assist with discharge planning and identification of hospital follow-up needs prior to discharge.              -- Estimated LOS: 7-10 days, pending clinical improvement             -- Discharge Concerns: Need to establish a safety plan; Medication compliance and effectiveness; pending OT evaluation for housing recommendations  PGY-2 JaFreida BusmanMD 07/06/2021, 2:41 PM

## 2021-07-06 NOTE — Progress Notes (Signed)
Psychoeducational Group Note  Date:  07/06/2021 Time:  2107  Group Topic/Focus:  Wrap-Up Group:   The focus of this group is to help patients review their daily goal of treatment and discuss progress on daily workbooks.  Participation Level: Did Not Attend  Participation Quality:  Not Applicable  Affect:  Not Applicable  Cognitive:  Not Applicable  Insight:  Not Applicable  Engagement in Group: Not Applicable  Additional Comments:  The patient did not attend group this evening.   Archie Balboa S 07/06/2021, 9:07 PM

## 2021-07-06 NOTE — Group Note (Signed)
Recreation Therapy Group Note   Group Topic:Coping Skills  Group Date: 07/06/2021 Start Time: 1624 End Time: 1038 Facilitators: Victorino Sparrow, LRT,CTRS Location: [address] Dayroom   Goal Area(s) Addresses: Patient will define what a coping skill is. Patient will create a list of healthy coping skills beginning with each letter of the alphabet. Patient will successfully identify positive coping skills they can use post d/c.  Patient will acknowledge benefit(s) of using learned coping skills post d/c.   Group Description: Coping A to Z. Patient asked to identify what a coping skill is and when they use them.  Next patients were given a blank worksheet titled "Coping Skills A-Z". Patients were instructed to come up with at least one positive coping skill per letter of the alphabet, addressing a specific challenge (ex: stress, anger, anxiety, depression, grief, doubt, isolation, self-harm/suicidal thoughts, substance use). Patients were given 15 minutes to brainstorm, before ideas were presented to the large group. Patients and LRT debriefed on the importance of coping skill selection based on situation and back-up plans when a skill tried is not effective. At the end of group, patients were given an handout of alphabetized strategies to keep for future reference.   Affect/Mood: N/A   Participation Level: Did not attend    Clinical Observations/Individualized Feedback:     Plan: Continue to engage patient in RT group sessions 2-3x/week.   Victorino Sparrow, LRT,CTRS  07/06/2021 11:45 AM

## 2021-07-06 NOTE — BHH Counselor (Signed)
CSW left voicemail with this patients guardian regarding discharge plans for later this week.     Darletta Moll MSW, LCSW Clincal Social Worker  [facility]

## 2021-07-06 NOTE — BHH Group Notes (Signed)
1400 Group Note- Patients were read Psychoeducational poem by Donzetta Kohut in which the brain is compared to two parts- the chimp who over reacts and disrupts out of emotional deregulation and the owl who analyzes things to help the situation. The patients were then asked to identify a time in their life in which they were both and share with the group.  Pt did not attend .

## 2021-07-06 NOTE — BHH Group Notes (Signed)
Orientation group and Psychoeducation group Patients were given self inventory sheets, and asked to discuss how they were feeling and one goal they would like to work on for today. Patients were then asked to read poem '' I walk down the street '' by Cristopher Peru as it contributes to negative behavioral patterns - and recognizing negative choices that we make as it impacts our mental health. Pt did not attend.

## 2021-07-06 NOTE — Group Note (Signed)
LCSW Group Therapy Note  Type of Therapy and Topic:  Group Therapy:  Setting Goals  Participation Level:  Did Not Attend  Description of Group: In this process group, patients discussed using strengths to work toward goals and address challenges.  Patients identified two positive things about themselves and one goal they were working on.  Patients were given the opportunity to share openly and support each others plan for self-empowerment.  The group discussed the value of gratitude and were encouraged to have a daily reflection of positive characteristics or circumstances.  Patients were encouraged to identify a plan to utilize their strengths to work on current challenges and goals.  Therapeutic Goals Patient will verbalize personal strengths/positive qualities and relate how these can assist with achieving desired personal goals Patients will verbalize affirmation of peers plans for personal change and goal setting Patients will explore the value of gratitude and positive focus as related to successful achievement of goals Patients will verbalize a plan for regular reinforcement of personal positive qualities and circumstances.  Summary of Patient Progress:  Did not attend     Therapeutic Modalities Cognitive Behavioral Therapy Motivational Interviewing

## 2021-07-06 NOTE — Progress Notes (Signed)
D: Pt alert and oriented. Pt denies experiencing any anxiety/depression at this time. Pt denies experiencing any pain at this time. Pt denies experiencing any SI/HI, or AVH at this time, describes mood as "alright."     A: Scheduled medications administered to pt, per MD orders. Support and encouragement provided. Frequent verbal contact made. Routine safety checks conducted q15 minutes.  R: No adverse drug reactions noted. Pt verbally contracts for safety at this time. Pt complaint with medications. Pt interacts minimally with others on the unit. Pt remains safe at this time. Will continue to monitor.

## 2021-07-06 NOTE — Progress Notes (Signed)
Pt concerned about having to take medication 3 x's a day especially at night. Pt was wondering if she could get her medication changed to 2 x's a day like 8 am and 6pm . Pt informed to discuss this with the doctor.    07/06/21 2100  Psych Admission Type (Psych Patients Only)  Admission Status Involuntary  Psychosocial Assessment  Patient Complaints Worrying;Anxiety  Eye Contact Fair;Suspiciousness  Facial Expression Anxious;Pensive  Affect Anxious;Preoccupied  Speech Logical/coherent  Interaction Cautious;Forwards little;Guarded  Motor Activity Slow  Appearance/Hygiene Unremarkable  Behavior Characteristics Cooperative  Mood Anxious;Preoccupied  Thought Pension scheme manager thinking  Content Paranoia  Delusions Controlled;Paranoid;Persecutory  Perception WDL  Hallucination None reported or observed  Judgment Poor  Confusion None  Danger to Self  Current suicidal ideation? Denies  Danger to Others  Danger to Others None reported or observed

## 2021-07-06 NOTE — Plan of Care (Signed)
Progress note  Pt found in bed; compliant with medication administration. Pt continues to feel they are being held against their will. Pt can be loud and hyper religious when discussing this. Pt is focused on discharge. Pt may need more time in the cafeteria for meals or to be allowed to go first for extra time. Pt refused to leave the cafeteria to day because they weren't done eating. Pt denies si/hi/ah/vh and verbally agrees to approach staff if these become apparent or before harming themselves/others while at [place].  A: Pt provided support and encouragement. Pt given medication per protocol and standing orders. Q72m safety checks implemented and continued.  R: Pt safe on the unit. Will continue to monitor.  Problem: Safety: Goal: Periods of time without injury will increase Outcome: Progressing   Problem: Activity: Goal: Will verbalize the importance of balancing activity with adequate rest periods Outcome: Progressing

## 2021-07-07 DIAGNOSIS — K5900 Constipation, unspecified: Secondary | ICD-10-CM | POA: Diagnosis not present

## 2021-07-07 DIAGNOSIS — F319 Bipolar disorder, unspecified: Secondary | ICD-10-CM | POA: Diagnosis not present

## 2021-07-07 DIAGNOSIS — C7972 Secondary malignant neoplasm of left adrenal gland: Secondary | ICD-10-CM | POA: Diagnosis not present

## 2021-07-07 DIAGNOSIS — D696 Thrombocytopenia, unspecified: Secondary | ICD-10-CM | POA: Diagnosis not present

## 2021-07-07 DIAGNOSIS — F29 Unspecified psychosis not due to a substance or known physiological condition: Principal | ICD-10-CM

## 2021-07-07 DIAGNOSIS — C7971 Secondary malignant neoplasm of right adrenal gland: Secondary | ICD-10-CM | POA: Diagnosis not present

## 2021-07-07 DIAGNOSIS — C7931 Secondary malignant neoplasm of brain: Secondary | ICD-10-CM | POA: Diagnosis not present

## 2021-07-07 DIAGNOSIS — E876 Hypokalemia: Secondary | ICD-10-CM | POA: Diagnosis not present

## 2021-07-07 DIAGNOSIS — E119 Type 2 diabetes mellitus without complications: Secondary | ICD-10-CM | POA: Diagnosis not present

## 2021-07-07 DIAGNOSIS — Z20822 Contact with and (suspected) exposure to covid-19: Secondary | ICD-10-CM | POA: Diagnosis not present

## 2021-07-07 LAB — BASIC METABOLIC PANEL
Anion gap: 6 (ref 5–15)
BUN: 20 mg/dL (ref 6–20)
CO2: 26 mmol/L (ref 22–32)
Calcium: 9.4 mg/dL (ref 8.9–10.3)
Chloride: 106 mmol/L (ref 98–111)
Creatinine, Ser: 0.52 mg/dL (ref 0.44–1.00)
GFR, Estimated: >60 mL/min (ref >60)
Glucose, Bld: 89 mg/dL (ref 70–99)
Potassium: 3.9 mmol/L (ref 3.5–5.1)
Sodium: 138 mmol/L (ref 135–145)

## 2021-07-07 MED ORDER — FLUPHENAZINE HCL 2.5 MG/ML IJ SOLN
5.0000 mg | Freq: Two times a day (BID) | INTRAMUSCULAR | Status: DC
  Filled 2021-07-07 (×6): qty 2

## 2021-07-07 MED ORDER — FLUPHENAZINE HCL 10 MG PO TABS
10.0000 mg | ORAL_TABLET | Freq: Two times a day (BID) | ORAL | Status: DC
  Administered 2021-07-07 – 2021-07-09 (×4): 10 mg via ORAL
  Filled 2021-07-07 (×6): qty 1

## 2021-07-07 NOTE — Progress Notes (Signed)
Psychoeducational Group Note  Date:  07/07/2021 Time:  2154  Group Topic/Focus:  Wrap-Up Group:   The focus of this group is to help patients review their daily goal of treatment and discuss progress on daily workbooks.  Participation Level: Did Not Attend  Participation Quality:  Not Applicable  Affect:  Not Applicable  Cognitive:  Not Applicable  Insight:  Not Applicable  Engagement in Group: Not Applicable  Additional Comments:  The patient did not attend group this evening.   Archie Balboa S 07/07/2021, 9:54 PM

## 2021-07-07 NOTE — Progress Notes (Signed)
Pt was encouraged but didn't attend orientation/goals group.

## 2021-07-07 NOTE — Progress Notes (Signed)
Pt stayed in her room much of the evening, pt expressed her pleasure of having medication doses changed    07/07/21 2100  Psych Admission Type (Psych Patients Only)  Admission Status Involuntary  Psychosocial Assessment  Patient Complaints None  Eye Contact Fair  Facial Expression Pensive  Affect Appropriate to circumstance;Sad  Speech Logical/coherent  Interaction Assertive;Minimal  Motor Activity Slow  Appearance/Hygiene Unremarkable  Behavior Characteristics Cooperative  Mood Pleasant;Sad  Aggressive Behavior  Effect No apparent injury  Thought Process  Coherency Concrete thinking  Content WDL  Delusions None reported or observed  Perception WDL  Hallucination None reported or observed  Judgment Poor  Confusion None  Danger to Self  Current suicidal ideation? Denies  Danger to Others  Danger to Others None reported or observed

## 2021-07-07 NOTE — Progress Notes (Signed)
°   07/07/21 0500  Sleep  Number of Hours 9.25

## 2021-07-07 NOTE — Group Note (Signed)
Recreation Therapy Group Note   Group Topic:Problem Solving  Group Date: 07/07/2021 Start Time: 6269 End Time: 1040 Facilitators: Victorino Sparrow, LRT,CTRS Location: [address] Dayroom   Goal Area(s) Addresses:  Patient will verbalize importance of using appropriate problem solving techniques.  Patient will identify positive change associated with effective problem solving skills.   Group Description:  Brain Teasers.  Patients were given three sheets of brain teasers.  Patients were to work through each problem to come up with the solution.  Patients were encouraged to take in all the clues when coming up with their answers.  Once patients completed the sheets, LRT and patients went through each one to see what each patient came up with.     Affect/Mood: N/A   Participation Level: Did not attend    Clinical Observations/Individualized Feedback:     Plan: Continue to engage patient in RT group sessions 2-3x/week.   Victorino Sparrow, LRT,CTRS 07/07/2021 11:22 AM

## 2021-07-07 NOTE — Progress Notes (Addendum)
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   Case was discussed in the multidisciplinary team. MAR was reviewed and patient was compliant with medications.    Psychiatric Team made the following recommendations yesterday: - Discontinue Haldol 5 mg twice daily p.o. or IM  -- Continue prolixin $RemoveBeforeDEI'5mg'kbgJuSKzHTlFNqsj$  PO TID or 2.$Remove'5mg'AdkDjez$  IM q8h -- Continue Ativan 0.5 mg twice daily p.o. or IM with forced medication over objection protocol in place - Continue as needed agitation protocol of Haldol 5 mg every 8 hours p.o. or IM, with/ or Ativan 1 mg every 8 hours p.o. or IM and Benadryl $RemoveBefo'50mg'hMErlwWrjHq$  po or IM every 6 hours PRN agitation - Continue Cogentin 0.5 mg twice daily PRN - will Check AIMS as she will cooperate for testing  On assessment today patient is very pleasant and does not attempt to evade provider. Patient reports that she likes the pastel and bright colors that providers are wearing, or have on their respective clipboards. Patient reports that she did not sleep well last night and believes that this is because she had to wake up to take medication and had a difficult time staying asleep due to hearing the q15  min checks. Despite this patient does not endorse poor mood. Patient is Aox3. Patient says that [name] still speaks to her, but is not preoccupied with this and does not give details about special messages or her "power" in the church. She denies ideas of reference or first rank symptoms. Patient endorses that she would like to take her medication BID and earlier in the evening to coincide with her early bedtime. Patient denies SI,  HI and VH.   Principal Problem: Schizophrenia spectrum disorder with psychotic disorder type not yet determined ([place]) Diagnosis: Principal Problem:   Schizophrenia spectrum disorder with psychotic disorder type not yet determined ([place]) Active Problems:   Brain metastasis ([place])   Melanoma of skin ([place])   Leukopenia   Thrombocytopenia ([place])  Total Time Spent in Direct Patient Care:  I personally spent 25 minutes on the unit in direct patient care. The direct patient care time included face-to-face time with the patient, reviewing the patient's chart, communicating with other professionals, and coordinating care. Greater than 50% of this time was spent in counseling or coordinating care with the patient regarding goals of hospitalization, psycho-education, and discharge planning needs.   Past Psychiatric History: See H&P  Past Medical History:  Past Medical History:  Diagnosis Date   Allergy    Anemia    Anemia    Diabetes mellitus without complication ([place])    prediabetic   GERD (gastroesophageal reflux disease)    melanoma with met dz dx'd 01/2018   brain and adrenal   Seasonal allergies     Past Surgical History:  Procedure Laterality Date   BIOPSY  01/28/2018   Procedure: BIOPSY;  Surgeon: Laurence Spates, MD;  Location: [facility];  Service: Endoscopy;;   BREAST BIOPSY Left    ESOPHAGOGASTRODUODENOSCOPY N/A 01/28/2018   Procedure: ESOPHAGOGASTRODUODENOSCOPY (EGD);  Surgeon: Laurence Spates, MD;  Location: [facility];  Service: Endoscopy;  Laterality: N/A;   MOUTH SURGERY     OVARY  SURGERY     removed "something"   Family History:  Family History  Problem Relation Age of Onset   Melanoma Mother    Hypertension Father    Breast cancer Maternal Aunt    Breast cancer Paternal Aunt    Diabetes Paternal Aunt    Breast cancer Maternal Grandmother    Breast cancer Paternal Grandmother    Diabetes Paternal Grandmother    Colon cancer Neg Hx    Esophageal cancer Neg Hx     Pancreatic cancer Neg Hx    Stomach cancer Neg Hx    Liver disease Neg Hx    Rectal cancer Neg Hx    Family Psychiatric  History: See H&P Social History:  Social History   Substance and Sexual Activity  Alcohol Use No     Social History   Substance and Sexual Activity  Drug Use No    Social History   Socioeconomic History   Marital status: Single    Spouse name: Not on file   Number of children: Not on file   Years of education: Not on file   Highest education level: Not on file  Occupational History   Not on file  Tobacco Use   Smoking status: Never   Smokeless tobacco: Never  Vaping Use   Vaping Use: Never used  Substance and Sexual Activity   Alcohol use: No   Drug use: No   Sexual activity: Not Currently  Other Topics Concern   Not on file  Social History Narrative   Not on file   Social Determinants of Health   Financial Resource Strain: Not on file  Food Insecurity: Not on file  Transportation Needs: Not on file  Physical Activity: Not on file  Stress: Not on file  Social Connections: Not on file    Sleep: Poor per patient but per nurses slept 9.25 hours  Appetite:  Good  Current Medications: Current Facility-Administered Medications  Medication Dose Route Frequency Provider Last Rate Last Admin   acetaminophen (TYLENOL) tablet 650 mg  650 mg Oral Q6H PRN Lamar Sprinkles, MD   650 mg at 07/03/21 2050   benztropine (COGENTIN) tablet 0.5 mg  0.5 mg Oral BID PRN Lamar Sprinkles, MD       diphenhydrAMINE (BENADRYL) capsule 50 mg  50 mg Oral Q6H PRN Bobbye Morton, MD       Or   diphenhydrAMINE (BENADRYL) injection 50 mg  50 mg Intramuscular Q6H PRN Eliseo Gum B, MD       feeding supplement (ENSURE ENLIVE / ENSURE PLUS) liquid 237 mL  237 mL Oral BID BM McQuilla, Jai B, MD   237 mL at 07/07/21 1332   fluPHENAZine (PROLIXIN) tablet 10 mg  10 mg Oral BID Eliseo Gum B, MD       Or   fluPHENAZine (PROLIXIN) injection 5 mg  5 mg Intramuscular BID  Eliseo Gum B, MD       haloperidol (HALDOL) tablet 5 mg  5 mg Oral Q8H PRN Mason Jim, Earley Grobe E, MD       Or   haloperidol lactate (HALDOL) injection 5 mg  5 mg Intramuscular Q8H PRN Mason Jim, Oiva Dibari E, MD       LORazepam (ATIVAN) tablet 0.5 mg  0.5 mg Oral BID Mason Jim, Harlee Eckroth E, MD   0.5 mg at 07/07/21 0754   Or   LORazepam (ATIVAN) injection 0.5 mg  0.5 mg Intramuscular BID Bartholomew Crews E, MD   0.5 mg at 07/03/21 0626  LORazepam (ATIVAN) tablet 1 mg  1 mg Oral Q8H PRN Harlow Asa, MD       Or   LORazepam (ATIVAN) injection 1 mg  1 mg Intramuscular Q8H PRN Harlow Asa, MD        Lab Results:  Results for orders placed or performed during the hospital encounter of 06/26/21 (from the past 48 hour(s))  Basic metabolic panel     Status: None   Collection Time: 07/07/21  6:27 AM  Result Value Ref Range   Sodium 138 135 - 145 mmol/L   Potassium 3.9 3.5 - 5.1 mmol/L   Chloride 106 98 - 111 mmol/L   CO2 26 22 - 32 mmol/L   Glucose, Bld 89 70 - 99 mg/dL    Comment: Glucose reference range applies only to samples taken after fasting for at least 8 hours.   BUN 20 6 - 20 mg/dL   Creatinine, Ser 0.52 0.44 - 1.00 mg/dL   Calcium 9.4 8.9 - 10.3 mg/dL   GFR, Estimated >60 >60 mL/min    Comment: (NOTE) Calculated using the CKD-EPI Creatinine Equation (2021)    Anion gap 6 5 - 15    Comment: Performed at [facility], [place] [address], [place], [place] [address]    Blood Alcohol level:  Lab Results  Component Value Date   [facility] <10 06/25/2021   ETH <10 40/04/2724    Metabolic Disorder Labs: Lab Results  Component Value Date   HGBA1C 5.1 06/28/2021   MPG 99.67 06/28/2021   MPG 119.76 02/03/2018   No results found for: PROLACTIN Lab Results  Component Value Date   CHOL 212 (H) 06/28/2021   TRIG 56 06/28/2021   HDL 63 06/28/2021   CHOLHDL 3.4 06/28/2021   VLDL 11 06/28/2021   LDLCALC 138 (H) 06/28/2021   LDLCALC 91 02/03/2018    Physical  Findings: AIMS: Facial and Oral Movements Muscles of Facial Expression: None, normal Lips and Perioral Area: None, normal Jaw: None, normal Tongue: None, normal,Extremity Movements Upper (arms, wrists, hands, fingers): None, normal Lower (legs, knees, ankles, toes): None, normal, Trunk Movements Neck, shoulders, hips: None, normal, Overall Severity Severity of abnormal movements (highest score from questions above): None, normal Incapacitation due to abnormal movements: None, normal Patient's awareness of abnormal movements (rate only patient's report): No Awareness, Dental Status Current problems with teeth and/or dentures?: No Does patient usually wear dentures?: No    Musculoskeletal: Strength & Muscle Tone: within normal limits Gait & Station: normal Patient leans: N/A  Psychiatric Specialty Exam:  Presentation  General Appearance: Appropriate for Environment  Eye Contact:Fair  Speech:Clear and Coherent  Speech Volume:Normal  Handedness:Right   Mood and Affect  Mood: calmer, more euthymic  Affect:Congruent   Thought Process  Thought Processes:superficially goal directed and more linear  Orientation:oriented to self, date, and President - not situation  Thought Content:Denies VH, ideas of reference, or first rank symptoms; reports [name] talking to her but does not make hyper-religious references unprompted on exam today; appears less paranoid; is not grossly responding to internal/external stimuli on exam; denies SI or HI  History of Schizophrenia/Schizoaffective disorder:Yes  Duration of Psychotic Symptoms:Greater than six months  Hallucinations: "hearing from [name]"   Ideas of Reference:Denied  Suicidal Thoughts:Suicidal Thoughts: No  Homicidal Thoughts:Homicidal Thoughts: No   Sensorium  Memory:Immediate Fair; Recent Fair  Judgment:-- (improving)  Insight:None   Executive Functions  Concentration:Fair  Attention  Span:Fair  Recall:Poor  [place]  Psychomotor Activity  Psychomotor Activity:Normal - no cogwheeling, no stiffness, no akathisias; AIMS 0   Assets  Assets:Resilience   Sleep  9.25 hours   Physical Exam HENT:     Head: Normocephalic and atraumatic.  Pulmonary:     Effort: Pulmonary effort is normal.  Neurological:     Mental Status: She is alert and oriented to person, place, and time.   Review of Systems  Respiratory:  Negative for shortness of breath.   Cardiovascular:  Negative for chest pain.  Gastrointestinal:  Negative for constipation, diarrhea and vomiting.  Psychiatric/Behavioral:  Negative for depression and suicidal ideas.   Blood pressure (!) 139/94, pulse 76, temperature 97.9 F (36.6 C), temperature source Oral, resp. rate 18, height $RemoveBe'5\' 1"'YFtVxelIb$  (1.549 m), weight 56 kg, SpO2 98 %. Body mass index is 23.32 kg/m.   Treatment Plan Summary: Daily contact with patient to assess and evaluate symptoms and progress in treatment and Medication management  The patient is a 51 year old female with a reported history of bipolar disorder and unspecified psychosis, and a medical history of melanoma with metastases to the brain, admitted under IVC for bizarre behaviors, agitation, delusions, and hyperreligiosity. Patient is much more pleasant and goal directed today. Patient is not hyperreligious nor is she grandiose. Patient did appear to be preoccupied with pastels today but was very easily redirectable. Patient also made appropriate recommendations regarding how she would like her medication scheduled.   Safety and Monitoring: INVOLUTARILY (by [employer]) admission to inpatient psychiatric unit for safety, stabilization and treatment Daily contact with patient to assess and evaluate symptoms and progress in treatment Patient's case to be discussed in multi-disciplinary team meeting Observation Level : q15 minute checks Vital signs: q12  hours Precautions: suicide, elopement, and assault   2. Psychiatric Problems #Unspecified schizophrenia spectrum and other psychotic d/o (r/o psychosis secondary to general medical condition - metatstatic melanoma vs bipolar d/o MRE manic with psychotic features) Notes from previous [place] visit report some baseline delusions, hyperreligiosity in 01/2021, with problems ongoing since 09/2020, per patient's reporting.  -- Increase prolixin $RemoveBeforeDEI'10mg'OtRvkwgEJKtHEreb$  PO BID or $Remo'5mg'YaCOf$  IM q12h with forced medication over objection protocol in place - Guardian provided consent for increase in Prolixin-please see SW note -- Continue Ativan 0.5 mg twice daily p.o. or IM with forced medication over objection protocol in place - will start to gradually taper off this medication prior to discharge - Continue as needed agitation protocol of Haldol 5 mg every 8 hours p.o. or IM, with/ or Ativan 1 mg every 8 hours p.o. or IM and Benadryl $RemoveBefo'50mg'ppXgEoEqrpT$  po or IM every 6 hours PRN agitation - Continue Cogentin 0.5 mg twice daily PRN  -- Repeat EKG for QTC monitoring on Prolixin   3. Medical Management Covid negative CMP: Calcium 8.4 CBC: WBC 3.9, H/H 11.7/37.1, platelets 60, ANC 2700 EtOH: <10 UDS: Negative TSH: 2.774 A1C: 5.1 Lipids: Cholesterol 212, LDL 138 QTC 437ms -K+ 3.3 - on repeat 3.9 on 07/07/21 - Ensure    #History of metastatic melanoma Metastases to the brain and adrenals first reported in 2019, and patient had full brain irradiation around that time.  Patient was with regular follow-up until April of this year, and was lost to follow-up for her July appointment and beyond. - Reached out to oncology: Per Dr. Lindi Adie- Verify with guardian whether she wants to go through treatment. If she wants to go through treatment, she will need a Whole body scan and to follow up in clinic outpatient; if not, consider  hospice.  -- Obtained CT CAP with contrast and CT head with and without contrast  for surveillance (results showed "bilateral  adrenal nodules that are decreased in size when compared with most recent prior exam, soft tissue lesion of the gallbladder increased in size when compared with prior exams, unchanged indeterminate hyperenhancing lesions of the caudate lobe of the liver and spleen, endometrial thickening which is increased compared with prior exam recommend further evaluation with pelvic ultrasound, and head CT shows no definite enhancing lesion or other acute intracranial process.") -- WILL REACH OUT TO GUARDIAN tom. Regarding need for outpatient f/u with oncology and need for pelvic ultrasound                 #Thrombocytopenia, improving #Leukopenia, resolved Platelets 60 (71 on repeat); ANC 2700 (3600 on repeat) and WBC 3.9 (5.1 on repeat) Appears chronic (platelets down to 57 5 months ago) - repeating tonight for trending while on Prolixin - Will need outpatient f/u with PCP/oncology after discharge  Hypokalemia - resolved - repeat K+ 3.9 on 07/07/21   4. Discharge Planning:              -- Social work and case management to assist with discharge planning and identification of hospital follow-up needs prior to discharge.              -- Estimated LOS: 7-10 days, pending clinical improvement             -- Discharge Concerns: Need to establish a safety plan; Medication compliance and effectiveness   PGY-2 Freida Busman, MD 07/07/2021, 4:35 PM

## 2021-07-07 NOTE — Progress Notes (Signed)
Pt denies SI/HI/AVH and verbally agrees to approach staff if these become apparent or before harming themselves/others. Rates depression 0/10. Rates anxiety 0/10. Rates pain 0/10. Pt has been going to meals but has been in room otherwise. Pt seen out of room minimally. Pt has not been agitated. Pt has been cooperative and calm. There have been no issues. Pt has not mentioned anything regarding religion. Pt has improved greatly. Scheduled medications administered to pt, per MD orders. RN provided support and encouragement to pt. Q15 min safety checks implemented and continued. Pt safe on the unit. RN will continue to monitor and intervene as needed.   07/07/21 0939  Psych Admission Type (Psych Patients Only)  Admission Status Involuntary  Psychosocial Assessment  Patient Complaints None  Eye Contact Fair  Facial Expression Pensive  Affect Appropriate to circumstance;Sad  Speech Logical/coherent  Interaction Assertive;Minimal  Motor Activity Slow  Appearance/Hygiene Unremarkable  Behavior Characteristics Cooperative;Calm  Mood Pleasant;Sad  Aggressive Behavior  Effect No apparent injury  Thought Process  Coherency Concrete thinking  Content WDL  Delusions None reported or observed  Perception WDL  Hallucination None reported or observed  Judgment Poor  Confusion None  Danger to Self  Current suicidal ideation? Denies  Danger to Others  Danger to Others None reported or observed

## 2021-07-07 NOTE — [facility] Counselor (Signed)
CSW spoke legal guardian Kem Kays (cell) [phone]. [name] was informed of this patients tentative discharge date and plans and has agreed to secure housing for this patient at time of discharge. Pt's guardian also agreed to this pt's prolixin being increased by attending MD.     Darletta Moll MSW, [facility]

## 2021-07-08 ENCOUNTER — Encounter (HOSPITAL_COMMUNITY): Payer: Self-pay

## 2021-07-08 LAB — CBC WITH DIFFERENTIAL/PLATELET
Abs Immature Granulocytes: 0.01 10*3/uL (ref 0.00–0.07)
Basophils Absolute: 0 10*3/uL (ref 0.0–0.1)
Basophils Relative: 0 %
Eosinophils Absolute: 0 10*3/uL (ref 0.0–0.5)
Eosinophils Relative: 0 %
HCT: 40.9 % (ref 36.0–46.0)
Hemoglobin: 13 g/dL (ref 12.0–15.0)
Immature Granulocytes: 0 %
Lymphocytes Relative: 31 %
Lymphs Abs: 1.4 10*3/uL (ref 0.7–4.0)
MCH: 28.1 pg (ref 26.0–34.0)
MCHC: 31.8 g/dL (ref 30.0–36.0)
MCV: 88.3 fL (ref 80.0–100.0)
Monocytes Absolute: 0.5 10*3/uL (ref 0.1–1.0)
Monocytes Relative: 11 %
Neutro Abs: 2.5 10*3/uL (ref 1.7–7.7)
Neutrophils Relative %: 58 %
Platelets: 68 10*3/uL — ABNORMAL LOW (ref 150–400)
RBC: 4.63 MIL/uL (ref 3.87–5.11)
RDW: 13.2 % (ref 11.5–15.5)
WBC: 4.4 10*3/uL (ref 4.0–10.5)
nRBC: 0 % (ref 0.0–0.2)

## 2021-07-08 MED ORDER — LORAZEPAM 2 MG/ML IJ SOLN: 0.5000 mg | Freq: Every day | INTRAMUSCULAR | Status: DC

## 2021-07-08 MED ORDER — LORAZEPAM 0.5 MG PO TABS
0.5000 mg | ORAL_TABLET | Freq: Every day | ORAL | Status: DC
  Administered 2021-07-09: 0.5 mg via ORAL
  Filled 2021-07-08: qty 1

## 2021-07-08 NOTE — Progress Notes (Signed)
Pt continues to be delusional at times, pt believes a staff member has put a camera in her shower and is specifically watching her. Pt was informed that there are no cameras in the rooms due to pt privacy. Pt continues to believe that a staff member set up cameras specifically to watch her especially in the shower    07/08/21 2000  Psych Admission Type (Psych Patients Only)  Admission Status Involuntary  Psychosocial Assessment  Patient Complaints Anxiety;Suspiciousness  Eye Contact Fair  Facial Expression Pensive  Affect Anxious;Irritable;Preoccupied  Speech Argumentative;Logical/coherent  Interaction Assertive;Dominating  Motor Activity Slow  Appearance/Hygiene Unremarkable  Behavior Characteristics Cooperative  Mood Anxious;Apprehensive  Thought Process  Coherency Concrete thinking  Content Religiosity;Paranoia  Delusions Controlled;Paranoid;Persecutory;Religious  Perception Derealization  Hallucination None reported or observed  Judgment Poor  Confusion None  Danger to Self  Current suicidal ideation? Denies  Danger to Others  Danger to Others None reported or observed

## 2021-07-08 NOTE — Progress Notes (Signed)
°   07/08/21 0500  Sleep  Number of Hours 9.5

## 2021-07-08 NOTE — Group Note (Signed)
LCSW Group Therapy Notes   Date and Time: 07/08/2021 1:00pm  Type of Therapy and Topic: Group Therapy: Worry and Anxiety  Participation Level: [facility] PARTICIPATION LEVEL: Did Not Attend  Description of Group: In this group, patients will be encouraged to explore their worry around what could happen vs what will happen. Each patient will be challenged to think of personal worries and how they will work their way through that worry around what will happen and what could happen. This group will be process-oriented, with patients participating in exploration of their own experiences as well as giving and receiving support and challenge from other group members.  Therapeutic Goals: Patient will identify personal worries that cause anxiety. Patient will identify clues to identify their worry. Patient will identify ways to handle their worry. Patient will discuss ways that their worry has deceased or why it has not decreased.   Summary of Patient Progress: Did not attend   Therapeutic Modalities:  Cognitive Behavioral Therapy Solution Focused Therapy Motivational Interviewing    Darletta Moll MSW, [place] Worker  [facility]

## 2021-07-08 NOTE — Plan of Care (Addendum)
Progress note  Pt found in bed; compliant with medication administration. Pt has been reclusive to their room most of the day resting. Pt voiced concerns to another staff member that they were paranoid that staff were spying on them in the shower and felt unsafe. Pt has been less religiously preoccupied when it comes to medication admin today. Pt denies si/hi/ah/vh and verbally agrees to approach staff if these become apparent or before harming themselves/others while at [place].  A: Pt provided support and encouragement. Pt given medication per protocol and standing orders. Q37m safety checks implemented and continued.  R: Pt safe on the unit. Will continue to monitor.  Problem: Health Behavior/Discharge Planning: Goal: Compliance with prescribed medication regimen will improve Outcome: Progressing   Problem: Nutritional: Goal: Ability to achieve adequate nutritional intake will improve Outcome: Progressing   Problem: Role Relationship: Goal: Ability to interact with others will improve Outcome: Progressing

## 2021-07-08 NOTE — Group Note (Signed)
Recreation Therapy Group Note   Group Topic:Other  Group Date: 07/08/2021 Start Time: 1586 End Time: 8257 Facilitators: Victorino Sparrow, LRT,CTRS Location: [address] Dayroom   Goal Area(s) Addresses:  Patient will identify what triggers them. Patient will identify how to deal with triggers. Patient will identify how triggers can be managed post d/c.  Group Description: Triggers.  Patients were given a worksheet to identify what triggers were.  Patients then identified what things triggers them.  Patients also identified how to avoid triggers and how to deal with triggers head on. Patients would also express how they would manage triggers post d/c.   Affect/Mood: N/A   Participation Level: Did not attend    Clinical Observations/Individualized Feedback:     Plan: Continue to engage patient in RT group sessions 2-3x/week.   Victorino Sparrow, LRT,CTRS 07/08/2021 12:49 PM

## 2021-07-08 NOTE — BH IP Treatment Plan (Signed)
Interdisciplinary Treatment and Diagnostic Plan Update  07/08/2021 Kaitlyn Duncan MRN: [id number]  Principal Diagnosis: Schizophrenia spectrum disorder with psychotic disorder type not yet determined [facility])  Secondary Diagnoses: Principal Problem:   Schizophrenia spectrum disorder with psychotic disorder type not yet determined [facility]) Active Problems:   Brain metastasis ([place])   Melanoma of skin ([place])   Leukopenia   Thrombocytopenia ([place])   Current Medications:  Current Facility-Administered Medications  Medication Dose Route Frequency Provider Last Rate Last Admin   acetaminophen (TYLENOL) tablet 650 mg  650 mg Oral Q6H PRN Rosezetta Schlatter, MD   650 mg at 07/03/21 2050   benztropine (COGENTIN) tablet 0.5 mg  0.5 mg Oral BID PRN Rosezetta Schlatter, MD       diphenhydrAMINE (BENADRYL) capsule 50 mg  50 mg Oral Q6H PRN Freida Busman, MD       Or   diphenhydrAMINE (BENADRYL) injection 50 mg  50 mg Intramuscular Q6H PRN Freida Busman, MD       feeding supplement (ENSURE ENLIVE / ENSURE PLUS) liquid 237 mL  237 mL Oral BID BM Damita Dunnings B, MD   237 mL at 07/07/21 1332   fluPHENAZine (PROLIXIN) tablet 10 mg  10 mg Oral BID Damita Dunnings B, MD   10 mg at 07/08/21 0745   Or   fluPHENAZine (PROLIXIN) injection 5 mg  5 mg Intramuscular BID Damita Dunnings B, MD       haloperidol (HALDOL) tablet 5 mg  5 mg Oral Q8H PRN Harlow Asa, MD       Or   haloperidol lactate (HALDOL) injection 5 mg  5 mg Intramuscular Q8H PRN Nelda Marseille, Amy E, MD       LORazepam (ATIVAN) tablet 0.5 mg  0.5 mg Oral BID Nelda Marseille, Amy E, MD   0.5 mg at 07/08/21 0745   Or   LORazepam (ATIVAN) injection 0.5 mg  0.5 mg Intramuscular BID Nelda Marseille, Amy E, MD   0.5 mg at 07/03/21 1808   LORazepam (ATIVAN) tablet 1 mg  1 mg Oral Q8H PRN Harlow Asa, MD       Or   LORazepam (ATIVAN) injection 1 mg  1 mg Intramuscular Q8H PRN Harlow Asa, MD       PTA Medications: Medications Prior to Admission   Medication Sig Dispense Refill Last Dose   clotrimazole (LOTRIMIN) 1 % cream Apply to affected area 2 times daily (Patient not taking: Reported on 06/25/2021) 15 g 0    fluticasone (FLONASE) 50 MCG/ACT nasal spray Place 2 sprays into both nostrils daily. (Patient not taking: Reported on 06/25/2021) 16 g 2    hydrocortisone cream 1 % Apply to affected area 2 times daily (Patient not taking: Reported on 06/25/2021) 15 g 0     Patient Stressors: Marital or family conflict   Medication change or noncompliance    Patient Strengths: General fund of knowledge  Motivation for treatment/growth   Treatment Modalities: Medication Management, Group therapy, Case management,  1 to 1 session with clinician, Psychoeducation, Recreational therapy.   Physician Treatment Plan for Primary Diagnosis: Schizophrenia spectrum disorder with psychotic disorder type not yet determined ([place]) Long Term Goal(s): Improvement in symptoms so as ready for discharge   Short Term Goals: Ability to identify changes in lifestyle to reduce recurrence of condition will improve Ability to identify and develop effective coping behaviors will improve Ability to maintain clinical measurements within normal limits will improve Compliance with prescribed medications will improve Ability to verbalize feelings  will improve Ability to demonstrate self-control will improve  Medication Management: Evaluate patient's response, side effects, and tolerance of medication regimen.  Therapeutic Interventions: 1 to 1 sessions, Unit Group sessions and Medication administration.  Evaluation of Outcomes: Progressing  Physician Treatment Plan for Secondary Diagnosis: Principal Problem:   Schizophrenia spectrum disorder with psychotic disorder type not yet determined ([place]) Active Problems:   Brain metastasis ([place])   Melanoma of skin ([place])   Leukopenia   Thrombocytopenia ([place])  Long Term Goal(s): Improvement in symptoms so as ready for  discharge   Short Term Goals: Ability to identify changes in lifestyle to reduce recurrence of condition will improve Ability to identify and develop effective coping behaviors will improve Ability to maintain clinical measurements within normal limits will improve Compliance with prescribed medications will improve Ability to verbalize feelings will improve Ability to demonstrate self-control will improve     Medication Management: Evaluate patient's response, side effects, and tolerance of medication regimen.  Therapeutic Interventions: 1 to 1 sessions, Unit Group sessions and Medication administration.  Evaluation of Outcomes: Progressing   RN Treatment Plan for Primary Diagnosis: Schizophrenia spectrum disorder with psychotic disorder type not yet determined ([place]) Long Term Goal(s): Knowledge of disease and therapeutic regimen to maintain health will improve  Short Term Goals: Ability to participate in decision making will improve, Ability to verbalize feelings will improve, and Ability to identify and develop effective coping behaviors will improve  Medication Management: RN will administer medications as ordered by provider, will assess and evaluate patient's response and provide education to patient for prescribed medication. RN will report any adverse and/or side effects to prescribing provider.  Therapeutic Interventions: 1 on 1 counseling sessions, Psychoeducation, Medication administration, Evaluate responses to treatment, Monitor vital signs and CBGs as ordered, Perform/monitor CIWA, COWS, AIMS and Fall Risk screenings as ordered, Perform wound care treatments as ordered.  Evaluation of Outcomes: Progressing   LCSW Treatment Plan for Primary Diagnosis: Schizophrenia spectrum disorder with psychotic disorder type not yet determined ([place]) Long Term Goal(s): Safe transition to appropriate next level of care at discharge, Engage patient in therapeutic group addressing  interpersonal concerns.  Short Term Goals: Engage patient in aftercare planning with referrals and resources, Increase ability to appropriately verbalize feelings, and Increase emotional regulation  Therapeutic Interventions: Assess for all discharge needs, 1 to 1 time with Social worker, Explore available resources and support systems, Assess for adequacy in community support network, Educate family and significant other(s) on suicide prevention, Complete Psychosocial Assessment, Interpersonal group therapy.  Evaluation of Outcomes: Progressing   Progress in Treatment: Attending groups: No. Participating in groups: No. Taking medication as prescribed: Yes. Toleration medication: Yes. Family/Significant other contact made: Yes, individual(s) contacted:  guardain Patient understands diagnosis: No. Discussing patient identified problems/goals with staff: No. Medical problems stabilized or resolved: Yes. Denies suicidal/homicidal ideation: Yes. Issues/concerns per patient self-inventory: No. Other: None  New problem(s) identified: No, Describe:  None  New Short Term/Long Term Goal(s): medication stabilization, elimination of SI thoughts, development of comprehensive mental wellness plan.   Patient Goals:  Did Not Attend  Discharge Plan or Barriers:  Patient is to be referred to [facility] for follow up at discharge  Reason for Continuation of Hospitalization: Delusions  Medication stabilization  Estimated Length of Stay: 3-5 days   Scribe for Treatment Team: Eliott Nine 07/08/2021 10:19 AM

## 2021-07-08 NOTE — Progress Notes (Addendum)
[facility] MD Progress Note  07/08/2021 3:04 PM Kaitlyn Duncan  MRN:  [id number] Subjective:   Kaitlyn Duncan is a 51 year old female with a reported psychiatric history of bipolar disorder and unspecified psychosis, and a medical history of melanoma of skin with metastases to the brain and adrenals, admitted under IVC from [facility] for bizarre behaviors, including barricading herself in the bathroom at her extended stay hotel and hyperreligiosity.   Case was discussed in the multidisciplinary team. MAR was reviewed and patient was compliant with medications.    Psychiatric Team made the following recommendations yesterday: -- Increase prolixin 16m PO BID or 525mIM q12h with forced medication over objection protocol in place - Guardian provided consent for increase in Prolixin-please see SW note -- Continue Ativan 0.5 mg twice daily p.o. or IM with forced medication over objection protocol in place - will start to gradually taper off this medication prior to discharge - Continue as needed agitation protocol of Haldol 5 mg every 8 hours p.o. or IM, with/ or Ativan 1 mg every 8 hours p.o. or IM and Benadryl 5064mo or IM every 6 hours PRN agitation - Continue Cogentin 0.5 mg twice daily PRN  -- Repeat EKG for QTC monitoring on Prolixin  On assessment today patient is noted to have moved to the other bed in her room. Patient reports that she was able to sleep better in this bed. Patient reports that she is still "hearing voices" and believes that one of the RN's on duty today had the maintenance team insert a camera and wire in her shower. Patient reports that she believes that this specific RN is watching her shower and "another person with short brown hair"  (such a person does not exist on the unit). Patient reports that she would like to see her oncologist and GI doctors as she feels she is having more frequent bowel movements. Patient reports that she feels that her medication adjustments are helping her, but she  still does not understand why she needs to be in the hospital. Patient reports she "follow the path of [name]" and should be released "because I am the leader of the WorAdvanced Micro DevicesPatient denies SI, HI, and VH.   Principal Problem: Schizophrenia spectrum disorder with psychotic disorder type not yet determined (HCCQuliniagnosis: Principal Problem:   Schizophrenia spectrum disorder with psychotic disorder type not yet determined (HCCUnderwoodctive Problems:   Brain metastasis (HCCYork Springs Melanoma of skin (HCCInnisw[place] Leukopenia   Thrombocytopenia (HCCFoot of TenTotal Time Spent in Direct Patient Care:  I personally spent 25 minutes on the unit in direct patient care. The direct patient care time included face-to-face time with the patient, reviewing the patient's chart, communicating with other professionals, and coordinating care. Greater than 50% of this time was spent in counseling or coordinating care with the patient regarding goals of hospitalization, psycho-education, and discharge planning needs.  Past Psychiatric History:  See H&P  Past Medical History:  Past Medical History:  Diagnosis Date   Allergy    Anemia    Anemia    Diabetes mellitus without complication (HCCTok  prediabetic   GERD (gastroesophageal reflux disease)    melanoma with met dz dx'd 01/2018   brain and adrenal   Seasonal allergies     Past Surgical History:  Procedure Laterality Date   BIOPSY  01/28/2018   Procedure: BIOPSY;  Surgeon: EdwLaurence SpatesD;  Location: [facility] ENDOSCOPY;  Service: Endoscopy;;   BREAST BIOPSY Left  ESOPHAGOGASTRODUODENOSCOPY N/A 01/28/2018   Procedure: ESOPHAGOGASTRODUODENOSCOPY (EGD);  Surgeon: Laurence Spates, MD;  Location: [facility];  Service: Endoscopy;  Laterality: N/A;   MOUTH SURGERY     OVARY SURGERY     removed "something"   Family History:  Family History  Problem Relation Age of Onset   Melanoma Mother    Hypertension Father    Breast cancer Maternal Aunt    Breast cancer Paternal  Aunt    Diabetes Paternal Aunt    Breast cancer Maternal Grandmother    Breast cancer Paternal Grandmother    Diabetes Paternal Grandmother    Colon cancer Neg Hx    Esophageal cancer Neg Hx    Pancreatic cancer Neg Hx    Stomach cancer Neg Hx    Liver disease Neg Hx    Rectal cancer Neg Hx    Family Psychiatric  History: See H&P  Social History:  Social History   Substance and Sexual Activity  Alcohol Use No     Social History   Substance and Sexual Activity  Drug Use No    Social History   Socioeconomic History   Marital status: Single    Spouse name: Not on file   Number of children: Not on file   Years of education: Not on file   Highest education level: Not on file  Occupational History   Not on file  Tobacco Use   Smoking status: Never   Smokeless tobacco: Never  Vaping Use   Vaping Use: Never used  Substance and Sexual Activity   Alcohol use: No   Drug use: No   Sexual activity: Not Currently  Other Topics Concern   Not on file  Social History Narrative   Not on file   Social Determinants of Health   Financial Resource Strain: Not on file  Food Insecurity: Not on file  Transportation Needs: Not on file  Physical Activity: Not on file  Stress: Not on file  Social Connections: Not on file   Additional Social History:                         Sleep: Fair  Appetite:  Good  Current Medications: Current Facility-Administered Medications  Medication Dose Route Frequency Provider Last Rate Last Admin   acetaminophen (TYLENOL) tablet 650 mg  650 mg Oral Q6H PRN Rosezetta Schlatter, MD   650 mg at 07/03/21 2050   benztropine (COGENTIN) tablet 0.5 mg  0.5 mg Oral BID PRN Rosezetta Schlatter, MD       diphenhydrAMINE (BENADRYL) capsule 50 mg  50 mg Oral Q6H PRN Damita Dunnings B, MD       Or   diphenhydrAMINE (BENADRYL) injection 50 mg  50 mg Intramuscular Q6H PRN Damita Dunnings B, MD       feeding supplement (ENSURE ENLIVE / ENSURE PLUS) liquid 237  mL  237 mL Oral BID BM McQuilla, Jai B, MD   237 mL at 07/07/21 1332   fluPHENAZine (PROLIXIN) tablet 10 mg  10 mg Oral BID Damita Dunnings B, MD   10 mg at 07/08/21 0745   Or   fluPHENAZine (PROLIXIN) injection 5 mg  5 mg Intramuscular BID Damita Dunnings B, MD       haloperidol (HALDOL) tablet 5 mg  5 mg Oral Q8H PRN Nelda Marseille, Gilford Lardizabal E, MD       Or   haloperidol lactate (HALDOL) injection 5 mg  5 mg Intramuscular Q8H PRN Harlow Asa, MD       [  START ON 07/09/2021] LORazepam (ATIVAN) tablet 0.5 mg  0.5 mg Oral Q breakfast Freida Busman, MD       Or   Derrill Memo ON 07/09/2021] LORazepam (ATIVAN) injection 0.5 mg  0.5 mg Intramuscular Q breakfast McQuilla, Mila Palmer, MD       LORazepam (ATIVAN) tablet 1 mg  1 mg Oral Q8H PRN Harlow Asa, MD       Or   LORazepam (ATIVAN) injection 1 mg  1 mg Intramuscular Q8H PRN Harlow Asa, MD        Lab Results:  Results for orders placed or performed during the hospital encounter of 06/26/21 (from the past 48 hour(s))  Basic metabolic panel     Status: None   Collection Time: 07/07/21  6:27 AM  Result Value Ref Range   Sodium 138 135 - 145 mmol/L   Potassium 3.9 3.5 - 5.1 mmol/L   Chloride 106 98 - 111 mmol/L   CO2 26 22 - 32 mmol/L   Glucose, Bld 89 70 - 99 mg/dL    Comment: Glucose reference range applies only to samples taken after fasting for at least 8 hours.   BUN 20 6 - 20 mg/dL   Creatinine, Ser 0.52 0.44 - 1.00 mg/dL   Calcium 9.4 8.9 - 10.3 mg/dL   GFR, Estimated >60 >60 mL/min    Comment: (NOTE) Calculated using the CKD-EPI Creatinine Equation (2021)    Anion gap 6 5 - 15    Comment: Performed at [facility], [place] [address] [place], [place] [address]  CBC with Differential/Platelet     Status: Abnormal   Collection Time: 07/08/21  6:39 AM  Result Value Ref Range   WBC 4.4 4.0 - 10.5 K/uL   RBC 4.63 3.87 - 5.11 MIL/uL   Hemoglobin 13.0 12.0 - 15.0 g/dL   HCT 40.9 36.0 - 46.0 %   MCV 88.3 80.0 - 100.0 fL   MCH  28.1 26.0 - 34.0 pg   MCHC 31.8 30.0 - 36.0 g/dL   RDW 13.2 11.5 - 15.5 %   Platelets 68 (L) 150 - 400 K/uL    Comment: Immature Platelet Fraction may be clinically indicated, consider ordering this additional test [id number] CONSISTENT WITH PREVIOUS RESULT    nRBC 0.0 0.0 - 0.2 %   Neutrophils Relative % 58 %   Neutro Abs 2.5 1.7 - 7.7 K/uL   Lymphocytes Relative 31 %   Lymphs Abs 1.4 0.7 - 4.0 K/uL   Monocytes Relative 11 %   Monocytes Absolute 0.5 0.1 - 1.0 K/uL   Eosinophils Relative 0 %   Eosinophils Absolute 0.0 0.0 - 0.5 K/uL   Basophils Relative 0 %   Basophils Absolute 0.0 0.0 - 0.1 K/uL   Immature Granulocytes 0 %   Abs Immature Granulocytes 0.01 0.00 - 0.07 K/uL    Comment: Performed at [facility], [place] [address], [place], [place] [address]    Blood Alcohol level:  Lab Results  Component Value Date   [facility] <10 06/25/2021   ETH <10 86/[birth year]1950    Metabolic Disorder Labs: Lab Results  Component Value Date   HGBA1C 5.1 06/28/2021   MPG 99.67 06/28/2021   MPG 119.76 02/03/2018   No results found for: PROLACTIN Lab Results  Component Value Date   CHOL 212 (H) 06/28/2021   TRIG 56 06/28/2021   HDL 63 06/28/2021   CHOLHDL 3.4 06/28/2021   VLDL 11 06/28/2021   LDLCALC 138 (H)  06/28/2021   LDLCALC 91 02/03/2018    Physical Findings: AIMS: Facial and Oral Movements Muscles of Facial Expression: None, normal Lips and Perioral Area: None, normal Jaw: None, normal Tongue: None, normal,Extremity Movements Upper (arms, wrists, hands, fingers): None, normal Lower (legs, knees, ankles, toes): None, normal, Trunk Movements Neck, shoulders, hips: None, normal, Overall Severity Severity of abnormal movements (highest score from questions above): None, normal Incapacitation due to abnormal movements: None, normal Patient's awareness of abnormal movements (rate only patient's report): No Awareness, Dental Status Current problems with teeth and/or  dentures?: No Does patient usually wear dentures?: No    Musculoskeletal: Strength & Muscle Tone: within normal limits Gait & Station: normal Patient leans: N/A  Psychiatric Specialty Exam:  Presentation  General Appearance: Appropriate for Environment  Eye Contact:Fair  Speech:Clear and Coherent  Speech Volume:Normal  Handedness:Right   Mood and Affect  Mood:Anxious  Affect:Blunt   Thought Process  Thought Processes:Concrete, linear  Orientation: Oriented to month, day, year and city but not situation  Thought Content:Patient is delusional that she does not have a guardian and makes hyper-religious statements during exam. She denies VH, ideas of reference, or first rank symptoms. She denies SI or HI. She reports AH of hearing [name]'s voice but is not grossly responding to internal/external stimuli on exam.  History of Schizophrenia/Schizoaffective disorder:Yes  Duration of Psychotic Symptoms:Greater than six months  Hallucinations:Hallucinations: Auditory  Ideas of Reference:Denied  Suicidal Thoughts:Suicidal Thoughts: No  Homicidal Thoughts:Homicidal Thoughts: No   Sensorium  Memory: Recent Fair  Judgment:Impaired  Insight:None   Executive Functions  Concentration:Poor  Attention Span:Poor  [place]   Psychomotor Activity  Psychomotor Activity:Psychomotor Activity: Decreased   Assets  Assets:Resilience   Sleep  Sleep:Sleep: Good   Physical Exam Vitals and nursing note reviewed.  HENT:     Head: Normocephalic and atraumatic.  Pulmonary:     Effort: Pulmonary effort is normal.  Neurological:     General: No focal deficit present.     Mental Status: She is alert and oriented to person, place, and time.   Review of Systems  Respiratory:  Negative for shortness of breath.   Cardiovascular:  Negative for chest pain.  Gastrointestinal:  Negative for constipation, nausea and vomiting.   Psychiatric/Behavioral:  Negative for suicidal ideas. The patient does not have insomnia.   Blood pressure (!) 132/97, pulse 88, temperature 98.2 F (36.8 C), temperature source Oral, resp. rate 18, height _0  (1.549 m), weight 56 kg, SpO2 98 %. Body mass index is 23.32 kg/m.   Treatment Plan Summary: Daily contact with patient to assess and evaluate symptoms and progress in treatment and Medication management  The patient is a 51 year old female with a reported history of bipolar disorder and unspecified psychosis, and a medical history of melanoma with metastases to the brain, admitted under IVC for bizarre behaviors, agitation, delusions, and hyperreligiosity. Patient appears less disorganized and is more easily redirectable. Patient remains delusional with poor insight. Patient has been complaint with her Prolixin but continues to isolate herself and refuses to attend groups. Patient continues to be resistant to certain therapies or conversations (ie safe dispo or fact she has a guardian). As patient has been more easily directable for 2 days will start to titrate down on Ativan and observe patient on antipsychotic alone. Patient's social hx, med hx, and age place her at increased risk for falls and would not like to increase this further with Ativan being prescribed at future  discharge.   Safety and Monitoring: INVOLUTARILY (by [employer]) admission to inpatient psychiatric unit for safety, stabilization and treatment Daily contact with patient to assess and evaluate symptoms and progress in treatment Patient's case to be discussed in multi-disciplinary team meeting Observation Level : q15 minute checks Vital signs: q12 hours Precautions: suicide, elopement, and assault   2. Psychiatric Problems #Unspecified schizophrenia spectrum and other psychotic d/o (r/o psychosis secondary to general medical condition - metatstatic melanoma vs bipolar d/o MRE manic with psychotic features) Notes from  previous [place] visit report some baseline delusions, hyperreligiosity in 01/2021, with problems ongoing since 09/2020, per patient's reporting.  -- Continue prolixin 2m PO BID or 574mIM q12h with forced medication over objection protocol in place - Guardian provided consent for increase in Prolixin-please see SW note - consider Prolixin LAI if guardian will consent --  Decrease to Ativan 0.5 mg daily p.o. or IM with forced medication over objection protocol in place - starting to gradually taper off this medication prior to discharge - Continue as needed agitation protocol of Haldol 5 mg every 8 hours p.o. or IM, with/ or Ativan 1 mg every 8 hours p.o. or IM and Benadryl 5057mo or IM every 6 hours PRN agitation - Continue Cogentin 0.5 mg twice daily PRN  -- Repeat EKG for QTC monitoring on Prolixin when patient will cooperate for testing   3. Medical Management Covid negative CMP: Calcium 8.4 CBC: WBC 3.9, H/H 11.7/37.1, platelets 60, ANC 2700 EtOH: <10 UDS: Negative TSH: 2.774 A1C: 5.1 Lipids: Cholesterol 212, LDL 138 QTC 424m68m+ 3.3 - on repeat 3.9 on 07/07/21 - Ensure    #History of metastatic melanoma Metastases to the brain and adrenals first reported in 2019, and patient had full brain irradiation around that time.  Patient was with regular follow-up until April of this year, and was lost to follow-up for her July appointment and beyond. - Reached out to oncology: Per Dr. GudeLindi Adierify with guardian whether she wants to go through treatment. If she wants to go through treatment, she will need a Whole body scan and to follow up in clinic outpatient; if not, consider hospice.  -- Obtained CT CAP with contrast and CT head with and without contrast  for surveillance (results showed "bilateral adrenal nodules that are decreased in size when compared with most recent prior exam, soft tissue lesion of the gallbladder increased in size when compared with prior exams, unchanged indeterminate  hyperenhancing lesions of the caudate lobe of the liver and spleen, endometrial thickening which is increased compared with prior exam recommend further evaluation with pelvic ultrasound, and head CT shows no definite enhancing lesion or other acute intracranial process.") -- Guardian and patient made it clear patient would like treatment  -- Oncology Appt made: 08/04/2021 at 10:30 am W/ Dr. ShadLorenza Cambridge RN- Will ask Dr. ShadAlen Blewschedule and his recommendations  -- GI  Appt with PA at [facility]- 1/20 at 10 am    #Thrombocytopenia, stable #Leukopenia, resolved Thrombocytopenia Appears chronic - platelets 68 on 1/4 WBC 4.4 with ANC 2500 on 1/4 - Outpatient f/u with PCP/oncology after discharge   Hypokalemia - resolved - repeat K+ 3.9 on 07/07/21   4. Discharge Planning:              -- Social work and case management to assist with discharge planning and identification of hospital follow-up needs prior to discharge.              --  Estimated LOS: 7-10 days, pending clinical improvement             -- Discharge Concerns: Need to establish a safety plan; Medication compliance and effectiveness      PGY-2 Freida Busman, MD 07/08/2021, 3:04 PM

## 2021-07-08 NOTE — Progress Notes (Signed)
06/30/21 at approx. 1430 patient was sent with writer (MHT) to [facility] for a CT scan. Prior to leaving [facility] the pt was adamant she was going to be staying at [facility] in Oncology and not returning to [facility]. MHT's and nursing staff tried to explain to the pt she would be returning to behavioral health after her CT, but she did not understand. Shuttle services took [place] over with another patient going to [facility] ED. There weren't any issues checking in or getting to central waiting. After the pt had been waiting for awhile she needed to use the restroom and got very upset that staff had to come with her. Patient told staff to leave her alone, stop following her, respect boundaries, etc. Writer tried to explain to pt why staff needed to be present but pt did not understand and thought writer was invading her privacy. Sherrie from CT called the pt back, having experience with her before, she called security as a precaution because pt was not likely to cooperate when it came time to leave. Pt yelled about writer being present during CT so we compromised by having writer stand behind the see-through glass with other staff during the scan. Security helped Product manager and pt back to central waiting. After waiting on transport for awhile the pt started to wonder back through the hospital, not responding to verbal redirection, at which point security was called again to assist writer with getting pt back to central waiting. The wait on safe transport was very long and the pt was less cooperative as time went on. Pt kept trying to leave out of the automatic double doors at the entrance to go to vehicles that were not safe transport. Pt was not responding to verbal redirection and due to the concern for her safety, security was called again. Sharee Pimple from [employer] lead her out of the street and back inside the building to central waiting. Security waited  with Probation officer and pt for safe transport and made sure pt and Probation officer got in the vehicle safely. Pt was cooperative once we got back to behavioral health and went back to the unit without issues. MHT assigned on unit was notified upon return, along with RN and Camera operator. Pt was put on unit restriction. Safety zone submitted.

## 2021-07-08 NOTE — Progress Notes (Signed)
Psychoeducational Group Note  Date:  07/08/2021 Time:  2113  Group Topic/Focus:  Wrap-Up Group:   The focus of this group is to help patients review their daily goal of treatment and discuss progress on daily workbooks.  Participation Level: Did Not Attend  Participation Quality:  Not Applicable  Affect:  Not Applicable  Cognitive:  Not Applicable  Insight:  Not Applicable  Engagement in Group: Not Applicable  Additional Comments:  The patient did not attend group this evening.   Archie Balboa S 07/08/2021, 9:13 PM

## 2021-07-09 LAB — URINALYSIS, COMPLETE (UACMP) WITH MICROSCOPIC
Bacteria, UA: NONE SEEN
Bilirubin Urine: NEGATIVE
Glucose, UA: NEGATIVE mg/dL
Hgb urine dipstick: NEGATIVE
Ketones, ur: NEGATIVE mg/dL
Leukocytes,Ua: NEGATIVE
Nitrite: NEGATIVE
Protein, ur: NEGATIVE mg/dL
Specific Gravity, Urine: 1.01 (ref 1.005–1.030)
pH: 7 (ref 5.0–8.0)

## 2021-07-09 MED ORDER — FLUPHENAZINE HCL 2.5 MG/ML IJ SOLN
5.0000 mg | Freq: Every day | INTRAMUSCULAR | Status: DC
  Filled 2021-07-09 (×3): qty 2

## 2021-07-09 MED ORDER — BENZTROPINE MESYLATE 0.5 MG PO TABS
0.5000 mg | ORAL_TABLET | Freq: Two times a day (BID) | ORAL | Status: DC
  Administered 2021-07-09 – 2021-07-15 (×13): 0.5 mg via ORAL
  Filled 2021-07-09 (×17): qty 1

## 2021-07-09 MED ORDER — FLUPHENAZINE HCL 10 MG PO TABS
10.0000 mg | ORAL_TABLET | Freq: Every day | ORAL | Status: DC
  Administered 2021-07-10 – 2021-07-15 (×6): 10 mg via ORAL
  Filled 2021-07-09 (×8): qty 1

## 2021-07-09 MED ORDER — FLUPHENAZINE HCL 2.5 MG PO TABS
12.5000 mg | ORAL_TABLET | Freq: Every day | ORAL | Status: DC
  Administered 2021-07-09 – 2021-07-10 (×2): 12.5 mg via ORAL
  Filled 2021-07-09 (×3): qty 1

## 2021-07-09 MED ORDER — FLUPHENAZINE HCL 2.5 MG/ML IJ SOLN
5.0000 mg | Freq: Every day | INTRAMUSCULAR | Status: DC
  Filled 2021-07-09 (×8): qty 2

## 2021-07-09 MED ORDER — DOCUSATE SODIUM 100 MG PO CAPS
100.0000 mg | ORAL_CAPSULE | Freq: Every day | ORAL | Status: DC | PRN
  Administered 2021-07-10: 100 mg via ORAL
  Filled 2021-07-09: qty 1

## 2021-07-09 NOTE — Progress Notes (Signed)
°   07/09/21 0510  Pain Assessment  Pain Score 3  Sleep  Number of Hours 8.25

## 2021-07-09 NOTE — Group Note (Signed)
Occupational Therapy Group Note  Group Topic:Feelings Management  Group Date: 07/09/2021 Start Time: 1400 End Time: 1435 Facilitators: Ponciano Ort, OT    Group Description: Group encouraged increased engagement and participation through discussion focused on Building Happiness. Patients were provided a handout and reviewed therapeutic strategies to build happiness including identifying gratitudes, random acts of kindness, exercise, meditation, positive journaling, and fostering relationships. Patients engaged in discussion and encouraged to reflect on each strategy and their experiences.  Therapeutic Goal(s): Identify strategies to build happiness. Identify and implement therapeutic strategies to improve overall mood. Practice and identify gratitudes, random acts of kindness, exercise, meditation, positive journaling, and fostering relationships   Participation Level: Did not attend   Plan: Continue to engage patient in OT groups 2 - 3x/week.   07/09/2021  Ponciano Ort, MOT, OTR/L

## 2021-07-09 NOTE — Progress Notes (Signed)
Pt was encouraged but didn't attend orientation/goals group.

## 2021-07-09 NOTE — Progress Notes (Signed)
Psychoeducational Group Note  Date:  07/09/2021 Time:  2017  Group Topic/Focus:  Wrap-Up Group:   The focus of this group is to help patients review their daily goal of treatment and discuss progress on daily workbooks.  Participation Level: Did Not Attend  Participation Quality:  Not Applicable  Affect:  Not Applicable  Cognitive:  Not Applicable  Insight:  Not Applicable  Engagement in Group: Not Applicable  Additional Comments:  The patient did not attend group this evening.   Archie Balboa S 07/09/2021, 8:17 PM

## 2021-07-09 NOTE — Progress Notes (Addendum)
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   Case was discussed in the multidisciplinary team. MAR was reviewed and patient was compliant with medications.    Psychiatric Team made the following recommendations yesterday: -- Continue prolixin $RemoveBeforeDEI'10mg'ixUyBvInxBwuAakI$  PO BID or $Remo'5mg'IzGxu$  IM q12h with forced medication over objection protocol in place - Guardian provided consent for increase in Prolixin-please see SW note - consider Prolixin LAI if guardian will consent --  Decrease to Ativan 0.5 mg daily p.o. or IM with forced medication over objection protocol in place - starting to gradually taper off this medication prior to discharge - Continue as needed agitation protocol of Haldol 5 mg every 8 hours p.o. or IM, with/ or Ativan 1 mg every 8 hours p.o. or IM and Benadryl $RemoveBefo'50mg'HEpdYFVlCMp$  po or IM every 6 hours PRN agitation - Continue Cogentin 0.5 mg twice daily PRN  -- Repeat EKG for QTC monitoring on Prolixin when patient will cooperate for testing -- Guardian and patient made it clear patient would like treatment             -- Oncology Appt made: 08/04/2021 at 10:30 am W/ Dr. Lorenza Cambridge his RN- Will ask Dr. Alen Blew to schedule and his recommendations             -- GI  Appt with PA at [place]-- 1/20 at 10 am    On assessment today patient reports she is considering taking a shower, but continues to be very worried about a "wire" being in her shower placed for certain staff to watch her. 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 Patient denies SI, HI, and VH. She denies ideas of reference or first rank symptoms. Patient reports that is a bit more anxious and is not really able to describe why. She voices no physical complaints but is ruminative about discharge plans.   Objectively patient is noted to tap her left leg unconsciously at times during exam, and it is not clear if this is nerves or mild tremors related to Prolixin use.  Principal Problem: Schizophrenia spectrum disorder with psychotic disorder type not yet determined ([place]) Diagnosis: Principal Problem:   Schizophrenia spectrum disorder with psychotic disorder type not yet determined ([place]) Active Problems:   Brain metastasis ([place])   Melanoma of skin ([place])   Leukopenia   Thrombocytopenia ([place])  Total Time Spent in Direct Patient Care:  I personally spent 30 minutes on the unit in direct patient care. The direct patient care time included face-to-face time with the patient, reviewing the patient's chart, communicating with other professionals, and coordinating care. Greater than 50% of this time was spent in counseling or coordinating care with the patient regarding goals of hospitalization, psycho-education, and discharge planning needs.   Past Psychiatric History:  See H&P  Past Medical History:  Past Medical History:  Diagnosis  Date   Allergy    Anemia    Anemia    Diabetes mellitus without complication ([place])    prediabetic   GERD (gastroesophageal reflux disease)    melanoma with met dz dx'd 01/2018   brain and adrenal   Seasonal allergies     Past Surgical History:  Procedure Laterality Date   BIOPSY  01/28/2018   Procedure: BIOPSY;  Surgeon: Laurence Spates, MD;  Location: [facility] ENDOSCOPY;  Service: Endoscopy;;   BREAST BIOPSY Left     ESOPHAGOGASTRODUODENOSCOPY N/A 01/28/2018   Procedure: ESOPHAGOGASTRODUODENOSCOPY (EGD);  Surgeon: Laurence Spates, MD;  Location: [facility];  Service: Endoscopy;  Laterality: N/A;   MOUTH SURGERY     OVARY SURGERY     removed "something"   Family History:  Family History  Problem Relation Age of Onset   Melanoma Mother    Hypertension Father    Breast cancer Maternal Aunt    Breast cancer Paternal Aunt    Diabetes Paternal Aunt    Breast cancer Maternal Grandmother    Breast cancer Paternal Grandmother    Diabetes Paternal Grandmother    Colon cancer Neg Hx    Esophageal cancer Neg Hx    Pancreatic cancer Neg Hx    Stomach cancer Neg Hx    Liver disease Neg Hx    Rectal cancer Neg Hx    Family Psychiatric  History: See H&P Social History:  Social History   Substance and Sexual Activity  Alcohol Use No     Social History   Substance and Sexual Activity  Drug Use No    Social History   Socioeconomic History   Marital status: Single    Spouse name: Not on file   Number of children: Not on file   Years of education: Not on file   Highest education level: Not on file  Occupational History   Not on file  Tobacco Use   Smoking status: Never   Smokeless tobacco: Never  Vaping Use   Vaping Use: Never used  Substance and Sexual Activity   Alcohol use: No   Drug use: No   Sexual activity: Not Currently  Other Topics Concern   Not on file  Social History Narrative   Not on file   Social Determinants of Health   Financial Resource Strain: Not on file  Food Insecurity: Not on file  Transportation Needs: Not on file  Physical Activity: Not on file  Stress: Not on file  Social Connections: Not on file   Additional Social History:                         Sleep: Good  Appetite:  Good  Current Medications: Current Facility-Administered Medications  Medication Dose Route Frequency Provider Last Rate Last Admin   acetaminophen (TYLENOL) tablet 650  mg  650 mg Oral Q6H PRN Rosezetta Schlatter, MD   650 mg at 07/09/21 0450   benztropine (COGENTIN) tablet 0.5 mg  0.5 mg Oral BID PRN Rosezetta Schlatter, MD       benztropine (COGENTIN) tablet 0.5 mg  0.5 mg Oral BID Damita Dunnings B, MD   0.5 mg at 07/09/21 1056   diphenhydrAMINE (BENADRYL) capsule 50 mg  50 mg Oral Q6H PRN Damita Dunnings B, MD       Or   diphenhydrAMINE (BENADRYL) injection 50 mg  50 mg Intramuscular Q6H PRN Damita Dunnings B, MD       docusate sodium (COLACE) capsule 100  mg  100 mg Oral Daily PRN Freida Busman, MD       feeding supplement (ENSURE ENLIVE / ENSURE PLUS) liquid 237 mL  237 mL Oral BID BM Damita Dunnings B, MD   237 mL at 07/09/21 1055   [START ON 07/10/2021] fluPHENAZine (PROLIXIN) tablet 10 mg  10 mg Oral Daily Freida Busman, MD       Or   Derrill Memo ON 07/10/2021] fluPHENAZine (PROLIXIN) injection 5 mg  5 mg Intramuscular Daily McQuilla, Joyce Gross B, MD       fluPHENAZine (PROLIXIN) tablet 12.5 mg  12.5 mg Oral Q2000 Damita Dunnings B, MD       Or   fluPHENAZine (PROLIXIN) injection 5 mg  5 mg Intramuscular Q2000 Damita Dunnings B, MD       haloperidol (HALDOL) tablet 5 mg  5 mg Oral Q8H PRN Harlow Asa, MD       Or   haloperidol lactate (HALDOL) injection 5 mg  5 mg Intramuscular Q8H PRN Nelda Marseille, Endre Coutts E, MD       LORazepam (ATIVAN) tablet 0.5 mg  0.5 mg Oral Q breakfast Damita Dunnings B, MD   0.5 mg at 07/09/21 0865   Or   LORazepam (ATIVAN) injection 0.5 mg  0.5 mg Intramuscular Q breakfast Damita Dunnings B, MD       LORazepam (ATIVAN) tablet 1 mg  1 mg Oral Q8H PRN Harlow Asa, MD       Or   LORazepam (ATIVAN) injection 1 mg  1 mg Intramuscular Q8H PRN Harlow Asa, MD        Lab Results:  Results for orders placed or performed during the hospital encounter of 06/26/21 (from the past 48 hour(s))  CBC with Differential/Platelet     Status: Abnormal   Collection Time: 07/08/21  6:39 AM  Result Value Ref Range   WBC 4.4 4.0 - 10.5 K/uL   RBC 4.63 3.87 - 5.11  MIL/uL   Hemoglobin 13.0 12.0 - 15.0 g/dL   HCT 40.9 36.0 - 46.0 %   MCV 88.3 80.0 - 100.0 fL   MCH 28.1 26.0 - 34.0 pg   MCHC 31.8 30.0 - 36.0 g/dL   RDW 13.2 11.5 - 15.5 %   Platelets 68 (L) 150 - 400 K/uL    Comment: Immature Platelet Fraction may be clinically indicated, consider ordering this additional test [id number] CONSISTENT WITH PREVIOUS RESULT    nRBC 0.0 0.0 - 0.2 %   Neutrophils Relative % 58 %   Neutro Abs 2.5 1.7 - 7.7 K/uL   Lymphocytes Relative 31 %   Lymphs Abs 1.4 0.7 - 4.0 K/uL   Monocytes Relative 11 %   Monocytes Absolute 0.5 0.1 - 1.0 K/uL   Eosinophils Relative 0 %   Eosinophils Absolute 0.0 0.0 - 0.5 K/uL   Basophils Relative 0 %   Basophils Absolute 0.0 0.0 - 0.1 K/uL   Immature Granulocytes 0 %   Abs Immature Granulocytes 0.01 0.00 - 0.07 K/uL    Comment: Performed at [facility], [place] [address], [place], [place] [address]    Blood Alcohol level:  Lab Results  Component Value Date   [facility] <10 06/25/2021   ETH <10 32/44/0102    Metabolic Disorder Labs: Lab Results  Component Value Date   HGBA1C 5.1 06/28/2021   MPG 99.67 06/28/2021   MPG 119.76 02/03/2018   No results found for: PROLACTIN Lab Results  Component Value Date   CHOL  212 (H) 06/28/2021   TRIG 56 06/28/2021   HDL 63 06/28/2021   CHOLHDL 3.4 06/28/2021   VLDL 11 06/28/2021   LDLCALC 138 (H) 06/28/2021   LDLCALC 91 02/03/2018    Physical Findings: AIMS: Facial and Oral Movements Muscles of Facial Expression: None, normal Lips and Perioral Area: None, normal Jaw: None, normal Tongue: None, normal,Extremity Movements Upper (arms, wrists, hands, fingers): None, normal Lower (legs, knees, ankles, toes): None, normal, Trunk Movements Neck, shoulders, hips: None, normal, Overall Severity Severity of abnormal movements (highest score from questions above): None, normal Incapacitation due to abnormal movements: None, normal Patient's awareness of abnormal  movements (rate only patient's report): No Awareness, Dental Status Current problems with teeth and/or dentures?: No Does patient usually wear dentures?: No    Musculoskeletal: Strength & Muscle Tone: within normal limits Gait & Station: normal Patient leans: N/A  Psychiatric Specialty Exam:  Presentation  General Appearance: Casual (barefoot today, not using her shoes)  Eye Contact:Fair  Speech:Clear and Coherent  Speech Volume:Normal  Handedness:Right   Mood and Affect  Mood:Anxious  Affect:constricted   Thought Process  Thought Processes:Concrete but linear  Descriptions of Associations:Circumstantial  Orientation:oriented to self, place, and time but not situation  Thought Content:makes hyper-religious statements and has grandiose and persecutory delusions; denies VH, ideas of reference, or first rank symptoms; appears paranoid on exam; questionable AH today  History of Schizophrenia/Schizoaffective disorder:Yes  Duration of Psychotic Symptoms:Greater than six months  Hallucinations:Hallucinations: Auditory  Ideas of Reference:denied  Suicidal Thoughts:Suicidal Thoughts: No  Homicidal Thoughts:Homicidal Thoughts: No   Sensorium  Memory:Immediate Fair; Recent Poor; Remote Poor  Judgment:- limited but compliant with meds  Insight:Shallow   Executive Functions  Concentration:Poor  Attention Span:Poor  Recall:Poor  Fund of Knowledge:Poor  Language:Fair   Psychomotor Activity  Psychomotor Activity:Unconscious movement of her LLE while laying down, able to stop on command. No cogwheeling, no stiffness, no UE or truncal movements, no face or tongue movements noted  Assets  Assets:Resilience   Sleep  8.25 hours   Physical Exam Constitutional:      Appearance: Normal appearance.  HENT:     Head: Normocephalic and atraumatic.  Pulmonary:     Effort: Pulmonary effort is normal.  Neurological:     Mental Status: She is alert.    Review of Systems  Respiratory:  Negative for shortness of breath.   Cardiovascular:  Negative for chest pain.  Gastrointestinal:  Negative for constipation, diarrhea, nausea and vomiting.  Genitourinary:  Positive for frequency.  Psychiatric/Behavioral:  Positive for hallucinations. Negative for suicidal ideas.   Blood pressure (!) 140/103, pulse 78, temperature 98.1 F (36.7 C), temperature source Oral, resp. rate 18, height $RemoveBe'5\' 1"'soOzFJUPa$  (1.549 m), weight 56 kg, SpO2 97 %. Body mass index is 23.32 kg/m.   Treatment Plan Summary: Daily contact with patient to assess and evaluate symptoms and progress in treatment and Medication management The patient is a 51 year old female with a reported history of bipolar disorder and unspecified psychosis, and a medical history of melanoma with metastases to the brain, admitted under IVC for bizarre behaviors, agitation, delusions, and hyperreligiosity. Patient continues to be more pleasant than on arrival; however she is resistant to interact with her guardian and continues to endorse hyperreligious delusions and paranoia. Patient's paranoia may get in the way of hygiene, plan to increase Prolixin. Will monitor to see if she is having increased anxiety with taper off Ativan or if she is developing tremors/akathesias on Prolixin.  Spoke with Zeba consent  to increase in Prolixin and starting cogentin. Guardian also consents to Prolixin IM if patient has ACT in place prior to d'c.   Safety and Monitoring: INVOLUTARILY (by [employer]) admission to inpatient psychiatric unit for safety, stabilization and treatment Daily contact with patient to assess and evaluate symptoms and progress in treatment Patient's case to be discussed in multi-disciplinary team meeting Observation Level : q15 minute checks Vital signs: q12 hours Precautions: suicide, elopement, and assault   2. Psychiatric Problems #Unspecified schizophrenia spectrum and other psychotic d/o  (r/o psychosis secondary to general medical condition - metatstatic melanoma vs bipolar d/o MRE manic with psychotic features) Notes from previous [place] visit report some baseline delusions, hyperreligiosity in 01/2021, with problems ongoing since 09/2020, per patient's reporting.  -- Increase prolixin $RemoveBeforeDEI'10mg'cORiKGTmkidZQNLs$  PO and 12.$RemoveB'5mg'VaJGKYem$  QHS or $Remo'5mg'TKYkp$  IM q12h with forced medication over objection protocol in place  --  Continue Ativan 0.5 mg daily p.o. or IM for one dose today and then stop  - Continue as needed agitation protocol of Haldol 5 mg every 8 hours p.o. or IM, with/ or Ativan 1 mg every 8 hours p.o. or IM and Benadryl $RemoveBefo'50mg'tgyOxCzZFjc$  po or IM every 6 hours PRN agitation - Start Cogentin 0.5 mg twice daily for possible tremor of LLE and monitor - will add Colace $RemoveBe'100mg'sORCkwiKO$  qd PRN for constipation with scheduled start of Cogentin -- Repeat EKG for QTC monitoring on Prolixin when patient will cooperate for testing -- Increase PO fluid intake, with start of Cogentin scheduled -- ACTT referral pending   3. Medical Management Covid negative CMP: Calcium 8.4 CBC: WBC 3.9, H/H 11.7/37.1, platelets 60, ANC 2700 EtOH: <10 UDS: Negative TSH: 2.774 A1C: 5.1 Lipids: Cholesterol 212, LDL 138 QTC 444ms -K+ 3.3 - on repeat 3.9 on 07/07/21 - Ensure    #History of metastatic melanoma Metastases to the brain and adrenals first reported in 2019, and patient had full brain irradiation around that time.  Patient was with regular follow-up until April of this year, and was lost to follow-up for her July appointment and beyond. - Reached out to oncology: Per Dr. Lindi Adie- Verify with guardian whether she wants to go through treatment. If she wants to go through treatment, she will need a Whole body scan and to follow up in clinic outpatient; if not, consider hospice.  -- Obtained CT CAP with contrast and CT head with and without contrast  for surveillance (results showed "bilateral adrenal nodules that are decreased in size when compared with  most recent prior exam, soft tissue lesion of the gallbladder increased in size when compared with prior exams, unchanged indeterminate hyperenhancing lesions of the caudate lobe of the liver and spleen, endometrial thickening which is increased compared with prior exam recommend further evaluation with pelvic ultrasound, and head CT shows no definite enhancing lesion or other acute intracranial process.") -- Guardian and patient made it clear patient would like treatment             -- Oncology Appt made: 08/04/2021 at 10:30 am W/ Dr. Lorenza Cambridge his RN- Will ask Dr. Alen Blew to schedule and his recommendations             -- GI  Appt with PA at [place]-- 1/20 at 10 am  Guardian has been made aware   #Thrombocytopenia, stable #Leukopenia, resolved Thrombocytopenia Appears chronic - platelets 68 on 1/4 WBC 4.4 with ANC 2500 on 1/4 - Outpatient f/u with PCP/oncology after discharge   Hypokalemia - resolved - repeat K+  3.9 on 07/07/21  Questionable urinary frequency - Checking clean catch UA   4. Discharge Planning:              -- Social work and case management to assist with discharge planning and identification of hospital follow-up needs prior to discharge.              -- Estimated LOS: 7-10 days, pending clinical improvement             -- Discharge Concerns: Need to establish a safety plan; Medication compliance and effectiveness   PGY-2 Freida Busman, MD 07/09/2021, 1:09 PM

## 2021-07-09 NOTE — Group Note (Signed)
Recreation Therapy Group Note   Group Topic:Communication  Group Date: 07/09/2021 Start Time: 0945 End Time: 1025 Facilitators: Victorino Sparrow, LRT,CTRS Location: [address] Dayroom   Goal Area(s) Addresses:  Patient will effectively listen to complete activity.  Patient will identify communication skills used to make activity successful.  Patient will identify how skills used during activity can be used to reach post d/c goals.    Group Description: Geometric Drawings.  Three volunteers from the peer group will be shown an abstract picture with a particular arrangement of geometrical shapes.  Each round, one 'speaker' will describe the pattern, as accurately as possible without revealing the image to the group.  The remaining group members will listen and draw the picture to reflect how it is described to them. Patients with the role of 'listener' cannot ask clarifying questions but, may request that the speaker repeat a direction. Once the drawings are complete, the presenter will show the rest of the group the picture and compare how close each person came to drawing the picture. LRT will facilitate a post-activity discussion regarding effective communication and the importance of planning, listening, and asking for clarification in daily interactions with others.   Affect/Mood: N/A   Participation Level: Did not attend    Clinical Observations/Individualized Feedback:     Plan: Continue to engage patient in RT group sessions 2-3x/week.   Victorino Sparrow, LRT,CTRS 07/09/2021 11:35 AM

## 2021-07-09 NOTE — Progress Notes (Signed)
Pt continues to be concerned about the different medications and the changes in dosages, writer tries to explain , but pt continues to have issues with changes in her Tx regimen . Pt continues to prefer to take HS medication before 6 pm.     07/09/21 2100  Psych Admission Type (Psych Patients Only)  Admission Status Involuntary  Psychosocial Assessment  Patient Complaints Suspiciousness;Worrying  Eye Contact Fair  Facial Expression Pensive  Affect Anxious;Irritable;Preoccupied  Speech Argumentative;Logical/coherent  Interaction Assertive;Dominating  Motor Activity Slow  Appearance/Hygiene Unremarkable  Behavior Characteristics Cooperative  Mood Suspicious  Thought Process  Coherency Concrete thinking  Content Religiosity;Paranoia  Delusions Controlled;Paranoid;Persecutory;Religious  Perception Derealization  Hallucination None reported or observed  Judgment Poor  Confusion None  Danger to Self  Current suicidal ideation? Denies  Danger to Others  Danger to Others None reported or observed

## 2021-07-09 NOTE — Progress Notes (Signed)
Pt was encouraged but didn't attend therapeutic relaxation group. ?

## 2021-07-10 NOTE — Progress Notes (Signed)
Dar Note: Patient presents with a calm affect and mood.  Medications given as prescribed.  Denies suicidal thoughts, auditory and visual hallucinations.  Patient visible in milieu interacting with staff and peers.  Attended group and participated.  Offered support and encouragement as needed.  Routine safety checks maintained.  Patient is safe on the unit.

## 2021-07-10 NOTE — [facility] Group Notes (Signed)
Pt didn't attend group. 

## 2021-07-10 NOTE — Progress Notes (Signed)
°   07/10/21 0600  Sleep  Number of Hours 9.25

## 2021-07-10 NOTE — Progress Notes (Signed)
Pt has issue taking her HS medication in the evening. Pt would like to take the evening dose no later than 6 pm , once pt gets in the bed for the evening, pt does not like getting up for medication due to pt trying to go to sleep.

## 2021-07-10 NOTE — Group Note (Signed)
Recreation Therapy Group Note   Group Topic:Team Building  Group Date: 07/10/2021 Start Time: 1008 End Time: 1035 Facilitators: Victorino Sparrow, LRT,CTRS Location: [address] Dayroom   Goal Area(s) Addresses:  Patient will effectively work with peer towards shared goal.  Patient will identify skills used to make activity successful.  Patient will identify how skills used during activity can be used to reach post d/c goals.   Group Description: Straw Bridge. In teams of 3-5, patients were given 15 plastic drinking straws and an equal length of masking tape. Using the materials provided, patients were instructed to build a free standing bridge-like structure to suspend an everyday item (ex: puzzle box) off of the floor or table surface. All materials were required to be used by the team in their design. LRT facilitated post-activity discussion reviewing team process. Patients were encouraged to reflect how the skills used in this activity can be generalized to daily life post discharge.    Affect/Mood: Appropriate   Participation Level: None   Participation Quality: None   Behavior: Appropriate   Speech/Thought Process: Focused   Insight: Moderate   Judgement: Moderate   Modes of Intervention: STEM Activity   Patient Response to Interventions:  Attentive   Education Outcome:  Acknowledges education and In group clarification offered    Clinical Observations/Individualized Feedback: Pt arrived to group late.  Pt observed while peer completed the construction of the bridge.  Pt was bright and smiling for the time she was in group.     Plan: Continue to engage patient in RT group sessions 2-3x/week.   Victorino Sparrow, LRT,CTRS 07/10/2021 12:23 PM

## 2021-07-10 NOTE — Progress Notes (Signed)
Pt was encouraged but didn't attend orientation/goals group.

## 2021-07-10 NOTE — Progress Notes (Signed)
Pt was encouraged but didn't attend therapeutic relaxation group. ?

## 2021-07-10 NOTE — Group Note (Signed)
Type of Therapy and Topic: Group Therapy: Gratitude  Participation: Active  Description of Group: The purpose behind this group is to get people thinking about things for which  they can be grateful. If continued over time, they might begin to spontaneously look for things and  situations for which to be grateful. Gratitude is related to a wide variety of forms of wellbeing , whereas negative attributions can adversely affect relationships.  Several studies have shown that interventions to  increase gratitude can impact areas such as overall life satisfaction, decreased negative affect, increased happiness, the ability to provide emotional support to others, and decreased worrying.  Summary of Progress: Pt attended and participated in group. Pt shared she is grateful for her faith and relationship with god.   Therapeutic Goals: Patient will learn activities that focus on gratitude in their daily lives. Patient will share gratitude in their daily lives. Patient will learn to develop healthy habits and positive thinking techniques. Patient will receive support and feedback from others   Therapeutic Modalities: Cognitive Behavioral Therapy Solution Focused Therapy Motivational Interviewing

## 2021-07-10 NOTE — Progress Notes (Signed)
Pt kept to herself much of the evening, but endorsed feeling better, just wanting to leave.    07/10/21 2000  Psych Admission Type (Psych Patients Only)  Admission Status Involuntary  Psychosocial Assessment  Patient Complaints Suspiciousness  Eye Contact Fair  Facial Expression Pensive  Affect Anxious;Irritable;Preoccupied  Speech Argumentative;Logical/coherent  Interaction Assertive;Dominating  Motor Activity Slow  Appearance/Hygiene Unremarkable  Behavior Characteristics Cooperative  Mood Suspicious  Thought Process  Coherency Concrete thinking  Content Religiosity;Paranoia  Delusions Controlled;Paranoid;Persecutory;Religious  Perception Derealization  Hallucination None reported or observed  Judgment Poor  Confusion None  Danger to Self  Current suicidal ideation? Denies  Danger to Others  Danger to Others None reported or observed

## 2021-07-10 NOTE — Progress Notes (Addendum)
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   Case was discussed in the multidisciplinary team. MAR was reviewed and patient was compliant with medications.    Psychiatric Team made the following recommendations yesterday: -- Increase prolixin 66m PO and 12.537mQHS or 36m3mM q12h with forced medication over objection protocol in place  --  Continue Ativan 0.5 mg daily p.o. or IM for one dose today and then stop  - Continue as needed agitation protocol of Haldol 5 mg every 8 hours p.o. or IM, with/ or Ativan 1 mg every 8 hours p.o. or IM and Benadryl 32m81m or IM every 6 hours PRN agitation - Start Cogentin 0.5 mg twice daily for possible tremor of LLE and monitor - will add Colace 100mg94mPRN for constipation with scheduled start of Cogentin -- Repeat EKG for QTC monitoring on Prolixin when patient will cooperate for testing -- Increase PO fluid intake, with start of Cogentin scheduled -- ACTT referral pending  Objectively patient went to one group this AM briefly, and was somewhat engaged. Patient reports that she is sleeping well and eating well. 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 Patient reports that she is most upset because the guardian put it her "that demonic hotel room!" Patient endorses that she "heard from so other people" that the room was demonic and endorses this is what led to her hospitalization.Patient reports that she does "hear things from the spirit realm" and endorses that  she hears her dead relatives. Patient denies SI, HI, and VH.  She voices no physical complaints.   Principal Problem: Schizophrenia spectrum disorder with psychotic disorder type not yet determined (HCC) [place]: Principal Problem:   Schizophrenia spectrum disorder with psychotic disorder type not yet determined (HCC) [place] Problems:   Brain metastasis (HCC) [place] of [place] (HCC) [place]   Thrombocytopenia (HCC) [place] Time spent with patient: I personally spent 25 minutes on the unit in direct patient care. The direct patient care time included face-to-face time with the patient, reviewing the patient's chart, communicating with other professionals, and coordinating care. Greater than 50% of this time was spent in counseling or coordinating care with the patient regarding goals of hospitalization, psycho-education, and discharge planning needs.    Past Psychiatric History:  See H&P  Past Medical History:  Past Medical History:  Diagnosis Date   Allergy    Anemia    Anemia    Diabetes mellitus without complication (HCC) [place]   GERD (gastroesophageal reflux disease)    melanoma with met dz dx'd 01/2018   brain and adrenal   Seasonal allergies     Past Surgical History:  Procedure Laterality Date   BIOPSY  01/28/2018   Procedure: BIOPSY;  Surgeon: EdwarLaurence Spates  Location: [facility];  Service: Endoscopy;;   BREAST BIOPSY Left    ESOPHAGOGASTRODUODENOSCOPY N/A 01/28/2018   Procedure: ESOPHAGOGASTRODUODENOSCOPY (EGD);  Surgeon: EdwarLaurence Spates  [facility];  Service: Endoscopy;  Laterality: N/A;  MOUTH SURGERY     OVARY SURGERY     removed "something"   Family History:  Family History  Problem Relation Age of Onset   Melanoma Mother    Hypertension Father    Breast cancer Maternal Aunt    Breast cancer Paternal Aunt    Diabetes Paternal Aunt    Breast cancer Maternal Grandmother    Breast cancer Paternal Grandmother    Diabetes  Paternal Grandmother    Colon cancer Neg Hx    Esophageal cancer Neg Hx    Pancreatic cancer Neg Hx    Stomach cancer Neg Hx    Liver disease Neg Hx    Rectal cancer Neg Hx    Family Psychiatric  History: See H&P Social History:  Social History   Substance and Sexual Activity  Alcohol Use No     Social History   Substance and Sexual Activity  Drug Use No    Social History   Socioeconomic History   Marital status: Single    Spouse name: Not on file   Number of children: Not on file   Years of education: Not on file   Highest education level: Not on file  Occupational History   Not on file  Tobacco Use   Smoking status: Never   Smokeless tobacco: Never  Vaping Use   Vaping Use: Never used  Substance and Sexual Activity   Alcohol use: No   Drug use: No   Sexual activity: Not Currently  Other Topics Concern   Not on file  Social History Narrative   Not on file   Social Determinants of Health   Financial Resource Strain: Not on file  Food Insecurity: Not on file  Transportation Needs: Not on file  Physical Activity: Not on file  Stress: Not on file  Social Connections: Not on file    Sleep: Good  Appetite:  Good  Current Medications: Current Facility-Administered Medications  Medication Dose Route Frequency Provider Last Rate Last Admin   acetaminophen (TYLENOL) tablet 650 mg  650 mg Oral Q6H PRN Rosezetta Schlatter, MD   650 mg at 07/10/21 1613   benztropine (COGENTIN) tablet 0.5 mg  0.5 mg Oral BID Damita Dunnings B, MD   0.5 mg at 07/10/21 1701   diphenhydrAMINE (BENADRYL) capsule 50 mg  50 mg Oral Q6H PRN Damita Dunnings B, MD       Or   diphenhydrAMINE (BENADRYL) injection 50 mg  50 mg Intramuscular Q6H PRN Damita Dunnings B, MD       docusate sodium (COLACE) capsule 100 mg  100 mg Oral Daily PRN Damita Dunnings B, MD   100 mg at 07/10/21 1004   feeding supplement (ENSURE ENLIVE / ENSURE PLUS) liquid 237 mL  237 mL Oral BID BM McQuilla, Jai B, MD   237 mL at  07/10/21 1501   fluPHENAZine (PROLIXIN) tablet 10 mg  10 mg Oral Daily Damita Dunnings B, MD   10 mg at 07/10/21 0849   Or   fluPHENAZine (PROLIXIN) injection 5 mg  5 mg Intramuscular Daily Damita Dunnings B, MD       fluPHENAZine (PROLIXIN) tablet 12.5 mg  12.5 mg Oral Q2000 Damita Dunnings B, MD   12.5 mg at 07/09/21 2044   Or   fluPHENAZine (PROLIXIN) injection 5 mg  5 mg Intramuscular Q2000 McQuilla, Joyce Gross B, MD       haloperidol (HALDOL) tablet 5 mg  5 mg Oral Q8H PRN Harlow Asa, MD  Or   haloperidol lactate (HALDOL) injection 5 mg  5 mg Intramuscular Q8H PRN Nelda Marseille,  E, MD       LORazepam (ATIVAN) tablet 1 mg  1 mg Oral Q8H PRN Harlow Asa, MD       Or   LORazepam (ATIVAN) injection 1 mg  1 mg Intramuscular Q8H PRN Harlow Asa, MD        Lab Results:  Results for orders placed or performed during the hospital encounter of 06/26/21 (from the past 48 hour(s))  Urinalysis, Complete w Microscopic Urine, Clean Catch     Status: Abnormal   Collection Time: 07/09/21  6:01 PM  Result Value Ref Range   Color, Urine STRAW (A) YELLOW   APPearance CLEAR CLEAR   Specific Gravity, Urine 1.010 1.005 - 1.030   pH 7.0 5.0 - 8.0   Glucose, UA NEGATIVE NEGATIVE mg/dL   Hgb urine dipstick NEGATIVE NEGATIVE   Bilirubin Urine NEGATIVE NEGATIVE   Ketones, ur NEGATIVE NEGATIVE mg/dL   Protein, ur NEGATIVE NEGATIVE mg/dL   Nitrite NEGATIVE NEGATIVE   Leukocytes,Ua NEGATIVE NEGATIVE   WBC, UA 0-5 0 - 5 WBC/hpf   Bacteria, UA NONE SEEN NONE SEEN   Squamous Epithelial / LPF 0-5 0 - 5    Comment: Performed at [facility], [place] [address], [place], [place] [address]    Blood Alcohol level:  Lab Results  Component Value Date   [facility] <10 06/25/2021   ETH <10 59/16/3846    Metabolic Disorder Labs: Lab Results  Component Value Date   HGBA1C 5.1 06/28/2021   MPG 99.67 06/28/2021   MPG 119.76 02/03/2018   No results found for: PROLACTIN Lab Results   Component Value Date   CHOL 212 (H) 06/28/2021   TRIG 56 06/28/2021   HDL 63 06/28/2021   CHOLHDL 3.4 06/28/2021   VLDL 11 06/28/2021   LDLCALC 138 (H) 06/28/2021   LDLCALC 91 02/03/2018    Physical Findings: AIMS: Facial and Oral Movements Muscles of Facial Expression: None, normal Lips and Perioral Area: None, normal Jaw: None, normal Tongue: None, normal,Extremity Movements Upper (arms, wrists, hands, fingers): None, normal Lower (legs, knees, ankles, toes): None, normal, Trunk Movements Neck, shoulders, hips: None, normal, Overall Severity Severity of abnormal movements (highest score from questions above): None, normal Incapacitation due to abnormal movements: None, normal Patient's awareness of abnormal movements (rate only patient's report): No Awareness, Dental Status Current problems with teeth and/or dentures?: No Does patient usually wear dentures?: No    Musculoskeletal: Strength & Muscle Tone: within normal limits Gait & Station: normal Patient leans: N/A  Psychiatric Specialty Exam:   Presentation  General Appearance: Casual with fair hygiene   Eye Contact:Fair   Speech:Clear and Coherent   Speech Volume:Normal   Handedness:Right     Mood and Affect  Mood:Anxious, aloof   Affect:constricted but less irritable     Thought Process  Thought Processes:Concrete, more linear   Orientation:oriented to self, place, and time but not situation   Thought Content:makes some residual hyper-religious statements and has grandiose and persecutory delusions; denies VH, ideas of reference, or first rank symptoms; appears less paranoid on exam; reports AH of "[name]"   History of Schizophrenia/Schizoaffective disorder:Yes   Duration of Psychotic Symptoms:Greater than six months   Hallucinations:Hallucinations: Auditory   Ideas of Reference:denied   Suicidal Thoughts:Suicidal Thoughts: No   Homicidal Thoughts:Homicidal Thoughts: No     Sensorium   Memory:Immediate Fair; Recent Poor; Remote Poor  Judgment:- limited but compliant with meds   Insight:Shallow     Executive Functions  Concentration:Poor   Attention Span:Poor   Recall:Poor   Fund of Knowledge:Poor   Language:Fair     Psychomotor Activity  Psychomotor Activity:No noted tremors, akathesias or restlessness today; steady gait   Assets  Assets:Resilience     Sleep  9.25 hours  Physical Exam HENT:     Head: Normocephalic and atraumatic.  Pulmonary:     Effort: Pulmonary effort is normal.  Neurological:     Mental Status: She is alert and oriented to person, place, and time.   Review of Systems  Genitourinary:  Positive for frequency.  Psychiatric/Behavioral:  Positive for hallucinations. Negative for suicidal ideas.   Blood pressure (!) 147/95, pulse 73, temperature 97.7 F (36.5 C), temperature source Oral, resp. rate 18, height 5' 1" (1.549 m), weight 56 kg, SpO2 100 %. Body mass index is 23.32 kg/m.   Treatment Plan Summary: Daily contact with patient to assess and evaluate symptoms and progress in treatment and Medication management  The patient is a 51 year old female with a reported history of bipolar disorder and unspecified psychosis, and a medical history of melanoma with metastases to the brain, admitted under IVC for bizarre behaviors, agitation, delusions, and hyperreligiosity. Patient is pleasant but continues to endorse AH. Patient is slowly leaving her room more and able to communicate her request in concerns appropriately.   Safety and Monitoring: INVOLUTARILY (by [employer]) admission to inpatient psychiatric unit for safety, stabilization and treatment Daily contact with patient to assess and evaluate symptoms and progress in treatment Patient's case to be discussed in multi-disciplinary team meeting Observation Level : q15 minute checks Vital signs: q12 hours Precautions: suicide, elopement, and assault   2. Psychiatric  Problems #Unspecified schizophrenia spectrum and other psychotic d/o (r/o psychosis secondary to general medical condition - metatstatic melanoma vs bipolar d/o MRE manic with psychotic features) -- Continue prolixin 337m PO and 12.563mQHS or 37m71mM q12h with forced medication over objection protocol in place  -- Tapered off Ativan  - Continue as needed agitation protocol of Haldol 5 mg every 8 hours p.o. or IM, with/ or Ativan 1 mg every 8 hours p.o. or IM and Benadryl 45m22m or IM every 6 hours PRN agitation - Continue Cogentin 0.5 mg twice daily for possible tremor of LLE and monitor - continue Colace 100mg39mPRN for constipation with scheduled start of Cogentin -- Repeat EKG on 1/5 shows QTC 414ms 73mncrease PO fluid intake, with start of Cogentin scheduled -- ACTT referral pending   3. Medical Management  #History of metastatic melanoma Metastases to the brain and adrenals first reported in 2019, and patient had full brain irradiation around that time.  Patient was with regular follow-up until April of this year, and was lost to follow-up for her July appointment and beyond. - Reached out to oncology: Per Dr. GudenaLindi Adiefy with guardian whether she wants to go through treatment. If she wants to go through treatment, she will need a Whole body scan and to follow up in clinic outpatient; if not, consider hospice.  -- Obtained CT CAP with contrast and CT head with and without contrast  for surveillance (results showed "bilateral adrenal nodules that are decreased in size when compared with most recent prior exam, soft tissue lesion of the gallbladder increased in size when compared with prior exams, unchanged indeterminate hyperenhancing lesions of the caudate lobe of the liver and spleen, endometrial thickening which  is increased compared with prior exam recommend further evaluation with pelvic ultrasound, and head CT shows no definite enhancing lesion or other acute intracranial process.") --  Guardian and patient made it clear patient would like treatment             -- Oncology Appt made: 08/04/2021 at 10:30 am W/ Dr. Lorenza Cambridge his RN- Will ask Dr. Alen Blew to schedule and his recommendations             -- GI  Appt with PA at [place]-- 1/20 at 10 am  Guardian has been made aware   #Thrombocytopenia, stable #Leukopenia, resolved Thrombocytopenia Appears chronic - platelets 68 on 1/4 WBC 4.4 with ANC 2500 on 1/4 - Outpatient f/u with PCP/oncology after discharge   Hypokalemia - resolved - repeat K+ 3.9 on 07/07/21   Questionable urinary frequency - UA on 07/09/21 WNL except for straw color   4. Discharge Planning:              -- Social work and case management to assist with discharge planning and identification of hospital follow-up needs prior to discharge.              -- Discharge Concerns: Need to establish a safety plan; Medication compliance and effectiveness  PGY-2 Freida Busman, MD 07/10/2021, 7:07 PM

## 2021-07-11 MED ORDER — FLUPHENAZINE HCL 2.5 MG/ML IJ SOLN
5.0000 mg | Freq: Every day | INTRAMUSCULAR | Status: DC
  Filled 2021-07-11 (×2): qty 2

## 2021-07-11 MED ORDER — FLUPHENAZINE HCL 2.5 MG PO TABS
12.5000 mg | ORAL_TABLET | Freq: Every day | ORAL | Status: DC
  Filled 2021-07-11: qty 1

## 2021-07-11 MED ORDER — LORAZEPAM 2 MG/ML IJ SOLN: 0.2500 mg | Freq: Two times a day (BID) | INTRAMUSCULAR | Status: DC

## 2021-07-11 MED ORDER — FLUPHENAZINE HCL 10 MG PO TABS
10.0000 mg | ORAL_TABLET | Freq: Every day | ORAL | Status: DC
  Administered 2021-07-11: 10 mg via ORAL
  Filled 2021-07-11 (×2): qty 1

## 2021-07-11 MED ORDER — FAMOTIDINE 20 MG PO TABS
20.0000 mg | ORAL_TABLET | Freq: Once | ORAL | Status: AC | PRN
  Administered 2021-07-11: 20 mg via ORAL
  Filled 2021-07-11: qty 1

## 2021-07-11 MED ORDER — LORAZEPAM 0.5 MG PO TABS: 0.2500 mg | ORAL_TABLET | Freq: Two times a day (BID) | ORAL | Status: DC

## 2021-07-11 MED ORDER — FLUPHENAZINE HCL 2.5 MG/ML IJ SOLN
5.0000 mg | Freq: Every day | INTRAMUSCULAR | Status: DC
  Filled 2021-07-11: qty 2

## 2021-07-11 MED ORDER — DOCUSATE SODIUM 100 MG PO CAPS
100.0000 mg | ORAL_CAPSULE | Freq: Every day | ORAL | Status: DC
  Administered 2021-07-12 – 2021-07-15 (×4): 100 mg via ORAL
  Filled 2021-07-11 (×6): qty 1

## 2021-07-11 MED ORDER — LORAZEPAM 0.5 MG PO TABS
0.2500 mg | ORAL_TABLET | Freq: Two times a day (BID) | ORAL | Status: DC
  Administered 2021-07-11 – 2021-07-15 (×8): 0.25 mg via ORAL
  Filled 2021-07-11 (×11): qty 1

## 2021-07-11 NOTE — [facility] Group Notes (Signed)
Patient did not attend group.   Spirituality group facilitated by Kathrynn Humble, [place].   Group Description: Group focused on topic of hope. Patients participated in facilitated discussion around topic, connecting with one another around experiences and definitions for hope. Group members engaged with visual explorer photos, reflecting on what hope looks like for them today. Group engaged in discussion around how their definitions of hope are present today in hospital.   Modalities: Psycho-social ed, Adlerian, Narrative, MI

## 2021-07-11 NOTE — Group Note (Signed)
°  [facility] LCSW Group Therapy Note  Date/Time:  07/11/2021 11:15AM-12:00PM  Type of Therapy and Topic:  Group Therapy:  Feelings About Hospitalization  Participation Level:  Did Not Attend   Description of Group This process group involved patients discussing their feelings related to being hospitalized, as well as the benefits they see to being in the hospital.  These feelings and benefits were itemized.  The group then brainstormed specific ways in which they could seek those same benefits when they discharge and return home.  Therapeutic Goals Patient will identify and describe positive and negative feelings related to hospitalization Patient will verbalize benefits of hospitalization to themselves personally Patients will brainstorm together ways they can obtain similar benefits in the outpatient setting, identify barriers to wellness and possible solutions  Summary of Patient Progress:  The patient was invited to group, did not attend.  Therapeutic Modalities Cognitive Behavioral Therapy Motivational Interviewing    Selmer Dominion, LCSW 07/11/2021, 1:40 PM

## 2021-07-11 NOTE — [facility] Group Notes (Signed)
Goals Group 07/11/21    Group Focus: affirmation, clarity of thought, and goals/reality orientation Treatment Modality:  Psychoeducation Interventions utilized were assignment, group exercise, and support Purpose: To be able to understand and verbalize the reason for their admission to the hospital. To understand that the medication helps with their chemical imbalance but they also need to work on their choices in life. To be challenged to develop a list of 30 positives about themselves. Also introduce the concept that "feelings" are not reality.  Participation Level:  did not attend   Paulino Rily

## 2021-07-11 NOTE — Progress Notes (Signed)
°   07/10/21 2000  Psych Admission Type (Psych Patients Only)  Admission Status Involuntary  Psychosocial Assessment  Patient Complaints Suspiciousness  Eye Contact Fair  Facial Expression Pensive  Affect Anxious;Irritable;Preoccupied  Speech Argumentative;Logical/coherent  Interaction Assertive;Dominating  Motor Activity Slow  Appearance/Hygiene Unremarkable  Behavior Characteristics Cooperative  Mood Suspicious  Thought Process  Coherency Concrete thinking  Content Religiosity;Paranoia  Delusions Controlled;Paranoid;Persecutory;Religious  Perception Derealization  Hallucination None reported or observed  Judgment Poor  Confusion None  Danger to Self  Current suicidal ideation? Denies  Danger to Others  Danger to Others None reported or observed

## 2021-07-11 NOTE — Progress Notes (Signed)
Adult Psychoeducational Group Note  Date:  07/11/2021 Time:  9:00 PM  Group Topic/Focus:  Wrap-Up Group:   The focus of this group is to help patients review their daily goal of treatment and discuss progress on daily workbooks.  Participation Level:  Active  Participation Quality:  Appropriate  Affect:  Appropriate  Cognitive:  Appropriate  Insight: Appropriate  Engagement in Group:  Engaged  Modes of Intervention:  Discussion  Additional Comments:  patient said her coping skills social contact with people.  Lenice Llamas Long 07/11/2021, 9:00 PM

## 2021-07-11 NOTE — Progress Notes (Signed)
Pt was encouraged but didn't attend orientation/goals group.

## 2021-07-11 NOTE — Progress Notes (Addendum)
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   Case was discussed in the multidisciplinary team. MAR was reviewed and patient was compliant with medications.    Psychiatric Team made the following recommendations yesterday: -- Continue prolixin $RemoveBeforeDEI'10mg'rzQxRGtjIFCKaGvU$  PO and 12.$RemoveB'5mg'cjvTaYnm$  QHS or $Remo'5mg'SaulS$  IM q12h with forced medication over objection protocol in place  -- Tapered off Ativan  - Continue as needed agitation protocol of Haldol 5 mg every 8 hours p.o. or IM, with/ or Ativan 1 mg every 8 hours p.o. or IM and Benadryl $RemoveBefo'50mg'bPgmsEeSuQR$  po or IM every 6 hours PRN agitation - Continue Cogentin 0.5 mg twice daily for possible tremor of LLE and monitor - continue Colace $RemoveBeforeD'100mg'TfxRfUgsJosODp$  qd PRN for constipation with scheduled start of Cogentin -- Repeat EKG on 1/5 shows QTC 450ms -- Increase PO fluid intake, with start of Cogentin scheduled -- ACTT referral pending.  Patient is seen in the presence of Dr. Nelda Marseille. Patient reports that she has some difficulty in sleeping last night due to sense of restlessness and switched beds but after then slept okay.  She reports good appetite.  She has been attending and participating in groups.  She denies any active or passive suicidal ideation, homicidal ideation, auditory and visual hallucinations.  She states that she is not hearing [name]'s voice today.  She denies first rank symptoms and ideas of reference.  She is still hyperreligious and states that she is [name]'s child.  When asked about current president, she states "Biden but he is not [name]'s chosen one".  She states that she has world Patent examiner on  [place].  She reports some tremors in her legs with some restlessness.  When asked if she can control the leg tremor, she states "yes" but was not able to control it fully.  She reports some dry mouth and constipation. She states the woman who is reporting to be her guardian is a "fraud".  Discussed decreasing Prolixin and adding Ativan to help with tremors/restlessness.  Patient agrees with the plan. She states that she plans to go to a hotel after discharge.  Discussed that she has an appointment with the ACTT team this Tuesday.  Patient states she does not need an ACTT team and wants to be discharged today.  Discussed that we are not planning to discharge her this weekend.  Principal Problem: Schizophrenia spectrum disorder with psychotic disorder type not yet determined ([place]) Diagnosis: Principal Problem:   Schizophrenia spectrum disorder with psychotic disorder type not yet determined ([place]) Active Problems:   Brain metastasis ([place])   Melanoma of skin ([place])   Leukopenia   Thrombocytopenia ([place])  Total Time spent with patient: I personally spent 25 minutes on the unit in direct patient care. The direct patient care time included face-to-face time with the patient, reviewing the patient's chart, communicating with other professionals, and coordinating care. Greater than 50% of this time was spent in counseling or coordinating care with the patient regarding goals of hospitalization, psycho-education, and discharge planning needs.    Past Psychiatric History:  See H&P  Past Medical History:  Past Medical History:  Diagnosis Date   Allergy  Anemia    Anemia    Diabetes mellitus without complication (HCC)    prediabetic   GERD (gastroesophageal reflux disease)    melanoma with met dz dx'd 01/2018   brain and adrenal   Seasonal allergies     Past Surgical History:  Procedure Laterality Date   BIOPSY  01/28/2018   Procedure: BIOPSY;  Surgeon: Carman Ching, MD;  Location: [facility];   Service: Endoscopy;;   BREAST BIOPSY Left    ESOPHAGOGASTRODUODENOSCOPY N/A 01/28/2018   Procedure: ESOPHAGOGASTRODUODENOSCOPY (EGD);  Surgeon: Carman Ching, MD;  Location: [facility];  Service: Endoscopy;  Laterality: N/A;   MOUTH SURGERY     OVARY SURGERY     removed "something"   Family History:  Family History  Problem Relation Age of Onset   Melanoma Mother    Hypertension Father    Breast cancer Maternal Aunt    Breast cancer Paternal Aunt    Diabetes Paternal Aunt    Breast cancer Maternal Grandmother    Breast cancer Paternal Grandmother    Diabetes Paternal Grandmother    Colon cancer Neg Hx    Esophageal cancer Neg Hx    Pancreatic cancer Neg Hx    Stomach cancer Neg Hx    Liver disease Neg Hx    Rectal cancer Neg Hx    Family Psychiatric  History: See H&P Social History:  Social History   Substance and Sexual Activity  Alcohol Use No     Social History   Substance and Sexual Activity  Drug Use No    Social History   Socioeconomic History   Marital status: Single    Spouse name: Not on file   Number of children: Not on file   Years of education: Not on file   Highest education level: Not on file  Occupational History   Not on file  Tobacco Use   Smoking status: Never   Smokeless tobacco: Never  Vaping Use   Vaping Use: Never used  Substance and Sexual Activity   Alcohol use: No   Drug use: No   Sexual activity: Not Currently  Other Topics Concern   Not on file  Social History Narrative   Not on file   Social Determinants of Health   Financial Resource Strain: Not on file  Food Insecurity: Not on file  Transportation Needs: Not on file  Physical Activity: Not on file  Stress: Not on file  Social Connections: Not on file    Sleep: Fair  Appetite:  Good  Current Medications: Current Facility-Administered Medications  Medication Dose Route Frequency Provider Last Rate Last Admin   acetaminophen (TYLENOL) tablet 650 mg  650 mg  Oral Q6H PRN Lamar Sprinkles, MD   650 mg at 07/11/21 0750   benztropine (COGENTIN) tablet 0.5 mg  0.5 mg Oral BID Eliseo Gum B, MD   0.5 mg at 07/11/21 0751   diphenhydrAMINE (BENADRYL) capsule 50 mg  50 mg Oral Q6H PRN Eliseo Gum B, MD       Or   diphenhydrAMINE (BENADRYL) injection 50 mg  50 mg Intramuscular Q6H PRN Bobbye Morton, MD       [START ON 07/12/2021] docusate sodium (COLACE) capsule 100 mg  100 mg Oral Daily Doda, Vandana, MD       feeding supplement (ENSURE ENLIVE / ENSURE PLUS) liquid 237 mL  237 mL Oral BID BM McQuilla, Jai B, MD   237 mL at 07/10/21 1501   fluPHENAZine (PROLIXIN) tablet 10 mg  10 mg Oral Daily Damita Dunnings B, MD   10 mg at 07/11/21 0751   Or   fluPHENAZine (PROLIXIN) injection 5 mg  5 mg Intramuscular Daily Damita Dunnings B, MD       fluPHENAZine (PROLIXIN) tablet 10 mg  10 mg Oral Q2000 Doda, Vandana, MD       Or   fluPHENAZine (PROLIXIN) injection 5 mg  5 mg Intramuscular Q2000 Doda, Vandana, MD       haloperidol (HALDOL) tablet 5 mg  5 mg Oral Q8H PRN Harlow Asa, MD       Or   haloperidol lactate (HALDOL) injection 5 mg  5 mg Intramuscular Q8H PRN Nelda Marseille, Georgio Hattabaugh E, MD       LORazepam (ATIVAN) tablet 0.25 mg  0.25 mg Oral BID Armando Reichert, MD       Or   LORazepam (ATIVAN) injection 0.25 mg  0.25 mg Intramuscular BID Armando Reichert, MD       LORazepam (ATIVAN) tablet 1 mg  1 mg Oral Q8H PRN Harlow Asa, MD       Or   LORazepam (ATIVAN) injection 1 mg  1 mg Intramuscular Q8H PRN Harlow Asa, MD        Lab Results:  Results for orders placed or performed during the hospital encounter of 06/26/21 (from the past 48 hour(s))  Urinalysis, Complete w Microscopic Urine, Clean Catch     Status: Abnormal   Collection Time: 07/09/21  6:01 PM  Result Value Ref Range   Color, Urine STRAW (A) YELLOW   APPearance CLEAR CLEAR   Specific Gravity, Urine 1.010 1.005 - 1.030   pH 7.0 5.0 - 8.0   Glucose, UA NEGATIVE NEGATIVE mg/dL   Hgb urine  dipstick NEGATIVE NEGATIVE   Bilirubin Urine NEGATIVE NEGATIVE   Ketones, ur NEGATIVE NEGATIVE mg/dL   Protein, ur NEGATIVE NEGATIVE mg/dL   Nitrite NEGATIVE NEGATIVE   Leukocytes,Ua NEGATIVE NEGATIVE   WBC, UA 0-5 0 - 5 WBC/hpf   Bacteria, UA NONE SEEN NONE SEEN   Squamous Epithelial / LPF 0-5 0 - 5    Comment: Performed at [facility], [place] [address], [place], [place] [address]    Blood Alcohol level:  Lab Results  Component Value Date   [facility] <10 06/25/2021   ETH <10 17/51/0258    Metabolic Disorder Labs: Lab Results  Component Value Date   HGBA1C 5.1 06/28/2021   MPG 99.67 06/28/2021   MPG 119.76 02/03/2018   No results found for: PROLACTIN Lab Results  Component Value Date   CHOL 212 (H) 06/28/2021   TRIG 56 06/28/2021   HDL 63 06/28/2021   CHOLHDL 3.4 06/28/2021   VLDL 11 06/28/2021   LDLCALC 138 (H) 06/28/2021   LDLCALC 91 02/03/2018    Physical Findings:  Musculoskeletal: Strength & Muscle Tone: within normal limits Gait & Station: normal Patient leans: N/A  Psychiatric Specialty Exam:   Presentation  General Appearance: Casual with fair hygiene   Eye Contact:Fair   Speech:Clear and Coherent   Speech Volume:Normal   Handedness:Right     Mood and Affect  Mood:Anxious, aloof   Affect:constricted and irritable at times, defensive at times     Thought Process  Thought Processes:Concrete, more linear   Orientation:oriented to self, place, and time but not situation   Thought Content:makes some residual hyper-religious statements and has grandiose delusions; denies VH, ideas of reference, or first rank symptoms; appears less paranoid on exam; is not grossly  responding to internal/external stimuli   History of Schizophrenia/Schizoaffective disorder:Yes   Duration of Psychotic Symptoms:Greater than six months   Hallucinations:Hallucinations: none   Ideas of Reference:denied   Suicidal Thoughts:Suicidal Thoughts: No    Homicidal Thoughts:Homicidal Thoughts: No     Sensorium  Memory:Recent Fair   Judgment:- limited but compliant with meds   Insight:Shallow     Executive Functions  Concentration:Fair   Attention Span:Fair   [place]     Psychomotor Activity  Psychomotor Activity:Has intermittent tremor in left foot that improves when she is made aware of tremor; no truncal movements or UE tremors; no oral/facial movement; no stiffness or cogwheeling on exam. Gait steady   Assets  Assets:Resilience     Sleep  7.25 hours  Physical Exam HENT:     Head: Normocephalic and atraumatic.  Pulmonary:     Effort: Pulmonary effort is normal.  Neurological:     Mental Status: She is alert and oriented to person, place, and time.   Review of Systems  Gastrointestinal:  Positive for constipation. Negative for abdominal pain, nausea and vomiting.  Neurological:  Positive for tremors. Negative for dizziness and headaches.  Psychiatric/Behavioral:  Negative for hallucinations and suicidal ideas. The patient is nervous/anxious.        Hyperreligiosity  Blood pressure (!) 143/98, pulse 80, temperature 97.6 F (36.4 C), temperature source Oral, resp. rate 18, height $RemoveBe'5\' 1"'DynQoQQMK$  (1.549 m), weight 56 kg, SpO2 99 %. Body mass index is 23.32 kg/m.   Treatment Plan Summary: Daily contact with patient to assess and evaluate symptoms and progress in treatment and Medication management  The patient is a 51 year old female with a reported history of bipolar disorder and unspecified psychosis, and a medical history of melanoma with metastases to the brain, admitted under IVC for bizarre behaviors, agitation, delusions, and hyperreligiosity. Patient is pleasant but denies AVH today but continues to be hyper religious.   Safety and Monitoring: INVOLUTARILY (by [employer]) admission to inpatient psychiatric unit for safety, stabilization and treatment Daily contact with  patient to assess and evaluate symptoms and progress in treatment Patient's case to be discussed in multi-disciplinary team meeting Observation Level : q15 minute checks Vital signs: q12 hours Precautions: suicide, elopement, and assault   2. Psychiatric Problems #Unspecified schizophrenia spectrum and other psychotic d/o (r/o psychosis secondary to general medical condition - metatstatic melanoma vs bipolar d/o MRE manic with psychotic features) --Decrease prolixin to $RemoveBef'10mg'HuhmzUGteh$  PO  Bid or $Remo'5mg'xOLgA$  IM q12h with forced medication over objection protocol in place. (Changed time for nightly dose of Prolixin to 6 PM) - hope to see if tremor in foot improves with dose reduction since delusions seem fixed at this time - will monitor for decompensation with dose reduction --Start Ativan 0.25 mg twice daily for report of restlessness on Prolixin  - Continue as needed agitation protocol of Haldol 5 mg every 8 hours p.o. or IM, with/ or Ativan 1 mg every 8 hours p.o. or IM and Benadryl $RemoveBefo'50mg'LhXXbcJDMjI$  po or IM every 6 hours PRN agitation - Continue Cogentin 0.5 mg twice daily for EPS   --schedule Colace $RemoveBeforeDEI'100mg'QHKUJyBAuvfjDaZl$  qd PRN for constipation  -- Repeat EKG on 1/5 shows QTC 489ms -- ACTT referral pending   3. Medical Management  #History of metastatic melanoma Metastases to the brain and adrenals first reported in 2019, and patient had full brain irradiation around that time.  Patient was with regular follow-up until April of this year, and was  lost to follow-up for her July appointment and beyond. - Reached out to oncology: Per Dr. Lindi Adie- Verify with guardian whether she wants to go through treatment. If she wants to go through treatment, she will need a Whole body scan and to follow up in clinic outpatient; if not, consider hospice.  -- Obtained CT CAP with contrast and CT head with and without contrast  for surveillance (results showed "bilateral adrenal nodules that are decreased in size when compared with most recent prior exam,  soft tissue lesion of the gallbladder increased in size when compared with prior exams, unchanged indeterminate hyperenhancing lesions of the caudate lobe of the liver and spleen, endometrial thickening which is increased compared with prior exam recommend further evaluation with pelvic ultrasound, and head CT shows no definite enhancing lesion or other acute intracranial process.") -- Guardian and patient made it clear patient would like treatment             -- Oncology Appt made: 08/04/2021 at 10:30 am W/ Dr. Lorenza Cambridge his RN- Will ask Dr. Alen Blew to schedule and his recommendations             -- GI Appt with PA at [place]-- 1/20 at 10 am  Guardian has been made aware   #Thrombocytopenia, stable #Leukopenia, resolved Thrombocytopenia Appears chronic - platelets 68 on 1/4 WBC 4.4 with ANC 2500 on 1/4 - Outpatient f/u with PCP/oncology after discharge   Hypokalemia - resolved - repeat K+ 3.9 on 07/07/21   Questionable urinary frequency - UA on 07/09/21 WNL except for straw color   4. Discharge Planning:              -- Social work and case management to assist with discharge planning and identification of hospital follow-up needs prior to discharge.              -- Discharge Concerns: Need to establish a safety plan; Medication compliance and effectiveness  PGY-2 Armando Reichert, MD 07/11/2021, 1:42 PM

## 2021-07-11 NOTE — Progress Notes (Signed)
°   07/11/21 0500  Sleep  Number of Hours 7.25

## 2021-07-12 MED ORDER — FLUPHENAZINE HCL 2.5 MG PO TABS
12.5000 mg | ORAL_TABLET | Freq: Every day | ORAL | Status: DC
  Administered 2021-07-12 – 2021-07-14 (×3): 12.5 mg via ORAL
  Filled 2021-07-12 (×5): qty 1

## 2021-07-12 MED ORDER — FLUPHENAZINE HCL 2.5 MG/ML IJ SOLN
5.0000 mg | Freq: Every day | INTRAMUSCULAR | Status: DC
  Filled 2021-07-12 (×5): qty 2

## 2021-07-12 NOTE — Progress Notes (Signed)
Patient denies SI, HI and AVH this shift. Patient attended groups was compliant with medications.  Patient had no incidents of behavioral dyscontrol this shift.  Monitor patient for safety, offer medications as prescribed, engage patient 1:1 staff talks.  Patient able to contract for safety. Continue to monitor as planned.

## 2021-07-12 NOTE — Progress Notes (Addendum)
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   Case was discussed in the multidisciplinary team. MAR was reviewed and patient was compliant with medications.  NS reported that yesterday evening patient called 911 for constipation.  Psychiatric Team made the following recommendations yesterday: --Decrease prolixin to $RemoveBef'10mg'NWZzhJANoa$  PO  Bid or $Remo'5mg'GeMwP$  IM q12h with forced medication over objection protocol in place. (Changed time for nightly dose of Prolixin to 6 PM) - hope to see if tremor in foot improves with dose reduction since delusions seem fixed at this time - will monitor for decompensation with dose reduction --Start Ativan 0.25 mg twice daily for report of restlessness on Prolixin  - Continue as needed agitation protocol of Haldol 5 mg every 8 hours p.o. or IM, with/ or Ativan 1 mg every 8 hours p.o. or IM and Benadryl $RemoveBefo'50mg'VcwClxdjnxj$  po or IM every 6 hours PRN agitation - Continue Cogentin 0.5 mg twice daily for EPS   --schedule Colace $RemoveBeforeDEI'100mg'gHIwSaFFYqDuONOx$  qd PRN for constipation  -- Repeat EKG on 1/5 shows QTC 48ms -- ACTT referral pending   Patient is seen in the presence of Dr. Nelda Marseille in the dayroom.  Patient is very angry, irritable and continues to be hyper-religious.  She states she does not need ACTT team or a guardian and she needs to file a petition to be released. She states her guardian is a "hateful witch".  Patient is argumentative states, I do not believe in legal system " you have not put your faith in [place]".  She states "this is illegal activity and I can send you both to hell".  She then asks our names and  threatening to report examiners to [name]. She states "you do not know Jesus" god".  When asked why she called 911? She states she called 911 to connect to physical [place] for physical issues (urinary and GI problems).  Patient states "by not letting me go, you are committing a crime".  She endorses auditory hallucinations states "I hear people in this demonic room".  She denies any visual hallucinations.  She denies any first rank symptoms and ideas of reference. She denies any active or passive suicidal ideation or homicidal ideations. When asked if she still has tremors.  She ended the interview stating " you are a hateful witch, I do not want to talk to you, you are a liar and a  fraud", "I told you that I wanted to leave and you are going to [name]'s journey alone and leaving me here". She would not cooperate for AIMs or EPS testing.  Principal Problem: Schizophrenia spectrum disorder with psychotic disorder type not yet determined ([place]) Diagnosis: Principal Problem:   Schizophrenia spectrum disorder with psychotic disorder type not yet determined ([place]) Active Problems:   Brain metastasis ([place])   Melanoma of skin ([place])   Leukopenia   Thrombocytopenia ([place])  Total Time spent with patient: I personally spent 30 minutes on the unit in direct patient care. The direct patient care time included face-to-face time with the patient, reviewing the patient's chart, communicating with other professionals, and coordinating care. Greater than 50% of this time was spent  in counseling or coordinating care with the patient regarding goals of hospitalization, psycho-education, and discharge planning needs.    Past Psychiatric History:  See H&P  Past Medical History:  Past Medical History:  Diagnosis Date   Allergy    Anemia    Anemia    Diabetes mellitus without complication ([place])    prediabetic   GERD (gastroesophageal reflux disease)    melanoma with met dz dx'd 01/2018   brain and adrenal    Seasonal allergies     Past Surgical History:  Procedure Laterality Date   BIOPSY  01/28/2018   Procedure: BIOPSY;  Surgeon: Laurence Spates, MD;  Location: [facility];  Service: Endoscopy;;   BREAST BIOPSY Left    ESOPHAGOGASTRODUODENOSCOPY N/A 01/28/2018   Procedure: ESOPHAGOGASTRODUODENOSCOPY (EGD);  Surgeon: Laurence Spates, MD;  Location: [facility];  Service: Endoscopy;  Laterality: N/A;   MOUTH SURGERY     OVARY SURGERY     removed "something"   Family History:  Family History  Problem Relation Age of Onset   Melanoma Mother    Hypertension Father    Breast cancer Maternal Aunt    Breast cancer Paternal Aunt    Diabetes Paternal Aunt    Breast cancer Maternal Grandmother    Breast cancer Paternal Grandmother    Diabetes Paternal Grandmother    Colon cancer Neg Hx    Esophageal cancer Neg Hx    Pancreatic cancer Neg Hx    Stomach cancer Neg Hx    Liver disease Neg Hx    Rectal cancer Neg Hx    Family Psychiatric  History: See H&P Social History:  Social History   Substance and Sexual Activity  Alcohol Use No     Social History   Substance and Sexual Activity  Drug Use No    Social History   Socioeconomic History   Marital status: Single    Spouse name: Not on file   Number of children: Not on file   Years of education: Not on file   Highest education level: Not on file  Occupational History   Not on file  Tobacco Use   Smoking status: Never   Smokeless tobacco: Never  Vaping Use   Vaping Use: Never used  Substance and Sexual Activity   Alcohol use: No   Drug use: No   Sexual activity: Not Currently  Other Topics Concern   Not on file  Social History Narrative   Not on file   Social Determinants of Health   Financial Resource Strain: Not on file  Food Insecurity: Not on file  Transportation Needs: Not on file  Physical Activity: Not on file  Stress: Not on file  Social Connections: Not on file    Sleep: Good  Appetite:   Good  Current Medications: Current Facility-Administered Medications  Medication Dose Route Frequency Provider Last Rate Last Admin   acetaminophen (TYLENOL) tablet 650 mg  650 mg Oral Q6H PRN Rosezetta Schlatter, MD   650 mg at 07/12/21 1348   benztropine (COGENTIN) tablet 0.5 mg  0.5 mg Oral BID Damita Dunnings B, MD   0.5 mg at 07/12/21 0841   diphenhydrAMINE (BENADRYL) capsule 50 mg  50 mg Oral Q6H PRN Damita Dunnings B, MD       Or   diphenhydrAMINE (BENADRYL) injection 50 mg  50 mg Intramuscular Q6H PRN McQuilla, Jai B, MD       docusate sodium (COLACE) capsule 100 mg  100 mg Oral Daily Armando Reichert, MD  100 mg at 07/12/21 0841   feeding supplement (ENSURE ENLIVE / ENSURE PLUS) liquid 237 mL  237 mL Oral BID BM Damita Dunnings B, MD   237 mL at 07/12/21 1347   fluPHENAZine (PROLIXIN) tablet 10 mg  10 mg Oral Daily Damita Dunnings B, MD   10 mg at 07/12/21 0841   Or   fluPHENAZine (PROLIXIN) injection 5 mg  5 mg Intramuscular Daily Damita Dunnings B, MD       fluPHENAZine (PROLIXIN) tablet 12.5 mg  12.5 mg Oral Q2000 Doda, Vandana, MD       Or   fluPHENAZine (PROLIXIN) injection 5 mg  5 mg Intramuscular Q2000 Doda, Vandana, MD       haloperidol (HALDOL) tablet 5 mg  5 mg Oral Q8H PRN Nelda Marseille, Oneta Sigman E, MD       Or   haloperidol lactate (HALDOL) injection 5 mg  5 mg Intramuscular Q8H PRN Nelda Marseille, Twilia Yaklin E, MD       LORazepam (ATIVAN) tablet 0.25 mg  0.25 mg Oral BID Armando Reichert, MD   0.25 mg at 07/12/21 0846   Or   LORazepam (ATIVAN) injection 0.25 mg  0.25 mg Intramuscular BID Armando Reichert, MD       LORazepam (ATIVAN) tablet 1 mg  1 mg Oral Q8H PRN Nelda Marseille, Braylea Brancato E, MD       Or   LORazepam (ATIVAN) injection 1 mg  1 mg Intramuscular Q8H PRN Harlow Asa, MD        Lab Results:  No results found for this or any previous visit (from the past 52 hour(s)).   Blood Alcohol level:  Lab Results  Component Value Date   ETH <10 06/25/2021   ETH <10 52/84/1324    Metabolic Disorder  Labs: Lab Results  Component Value Date   HGBA1C 5.1 06/28/2021   MPG 99.67 06/28/2021   MPG 119.76 02/03/2018   No results found for: PROLACTIN Lab Results  Component Value Date   CHOL 212 (H) 06/28/2021   TRIG 56 06/28/2021   HDL 63 06/28/2021   CHOLHDL 3.4 06/28/2021   VLDL 11 06/28/2021   LDLCALC 138 (H) 06/28/2021   LDLCALC 91 02/03/2018    Physical Findings:  Musculoskeletal: Strength & Muscle Tone: within normal limits Gait & Station: normal Patient leans: N/A  Psychiatric Specialty Exam:   Presentation  General Appearance: Casual with fair hygiene   Eye Contact:Minimal   Speech:Clear and Coherent - rambling today   Speech Volume:Normal   Handedness:Right     Mood and Affect  Mood: Angry, irritable, aloof   Affect:constricted, irritable and angry, defensive at times     Thought Process  Thought Processes:Concrete, ruminative    Orientation: would not cooperate for questioning   Thought Content:makes frequent hyper-religious statements and has grandiose delusions; denies VH, ideas of reference, or first rank symptoms; reports AH of demonic nature, appears paranoid on exam; is not grossly responding to internal/external stimuli   History of Schizophrenia/Schizoaffective disorder:Yes   Duration of Psychotic Symptoms:Greater than six months   Hallucinations: AH as noted above   Ideas of Reference:denied   Suicidal Thoughts:Suicidal Thoughts: No   Homicidal Thoughts:Homicidal Thoughts: No     Sensorium  Memory:Poor   Judgment:- Poor   Insight:Poor     Executive Functions  Concentration:Fair   Attention Span:Fair   Recall:Poor   Fund of Knowledge:Fair   Language:Fair     Psychomotor Activity  Psychomotor Activity :intermittent tremors resolved at rest; no truncal movements; no  oral/facial movements noted  Assets  Assets:Resilience, has guardian     Sleep  Not charted  Physical Exam HENT:     Head: Normocephalic and  atraumatic.  Pulmonary:     Effort: Pulmonary effort is normal.  Neurological:     Mental Status: She is alert and oriented to person, place, and time.   Review of Systems  Gastrointestinal:  Positive for constipation. Negative for abdominal pain, nausea and vomiting.  Neurological:  Negative for dizziness, tremors and headaches.  Psychiatric/Behavioral:         Hyperreligiosity  Blood pressure (!) 132/93, pulse 71, temperature 97.7 F (36.5 C), temperature source Oral, resp. rate 18, height $RemoveBe'5\' 1"'UaiEuqvIO$  (1.549 m), weight 56 kg, SpO2 99 %. Body mass index is 23.32 kg/m.   Treatment Plan Summary: Daily contact with patient to assess and evaluate symptoms and progress in treatment and Medication management  The patient is a 51 year old female with a reported history of bipolar disorder and unspecified psychosis, and a medical history of melanoma with metastases to the brain, admitted under IVC for bizarre behaviors, agitation, delusions, and hyperreligiosity.  Today, patient is very irritable and angry, has residual delusions and continues to be hyper religious. She appears to have acutely decompensated with Prolixin decrease.  Safety and Monitoring: INVOLUTARILY (by [employer]) admission to inpatient psychiatric unit for safety, stabilization and treatment Daily contact with patient to assess and evaluate symptoms and progress in treatment Patient's case to be discussed in multi-disciplinary team meeting Observation Level : q15 minute checks Vital signs: q12 hours Precautions: suicide, elopement, and assault   2. Psychiatric Problems #Unspecified schizophrenia spectrum and other psychotic d/o (r/o psychosis secondary to general medical condition - metatstatic melanoma vs bipolar d/o MRE manic with psychotic features) -- Increase prolixin to $RemoveBef'10mg'eFfnkheCiI$  PO daily and 12.5 mg at 6 PM or $Rem'5mg'DnFq$  IM q12h with forced medication over objection protocol in place.- will monitor for return of tremor with dose  increase -- Continue Ativan 0.25 mg twice daily for report of restlessness on Prolixin  - Continue as needed agitation protocol of Haldol 5 mg every 8 hours p.o. or IM, with/ or Ativan 1 mg every 8 hours p.o. or IM and Benadryl $RemoveBefo'50mg'mRkJImCLhmw$  po or IM every 6 hours PRN agitation - Continue Cogentin 0.5 mg twice daily for EPS (Scheduled Colace $RemoveBeforeDEI'100mg'VgUqJhBfuEjdovba$  qd PRN for constipation) -- Repeat EKG on 1/5 shows QTC 462ms -- ACTT meeting with patient- Tuesday   3. Medical Management  #History of metastatic melanoma Metastases to the brain and adrenals first reported in 2019, and patient had full brain irradiation around that time.  Patient was with regular follow-up until April of this year, and was lost to follow-up for her July appointment and beyond. - Reached out to oncology: Per Dr. Lindi Adie- Verify with guardian whether she wants to go through treatment. If she wants to go through treatment, she will need a Whole body scan and to follow up in clinic outpatient; if not, consider hospice.  -- Obtained CT CAP with contrast and CT head with and without contrast  for surveillance (results showed "bilateral adrenal nodules that are decreased in size when compared with most recent prior exam, soft tissue lesion of the gallbladder increased in size when compared with prior exams, unchanged indeterminate hyperenhancing lesions of the caudate lobe of the liver and spleen, endometrial thickening which is increased compared with prior exam recommend further evaluation with pelvic ultrasound, and head CT shows no definite enhancing lesion or other acute intracranial  process.") -- Guardian and patient made it clear patient would like treatment             -- Oncology Appt made: 08/04/2021 at 10:30 am W/ Dr. Lorenza Cambridge his RN- Will ask Dr. Alen Blew to schedule and his recommendations             -- GI Appt with PA at [facility]-- 1/20 at 10 am  Guardian has been made aware   #Thrombocytopenia, stable #Leukopenia,  resolved Thrombocytopenia Appears chronic - platelets 68 on 1/4 WBC 4.4 with ANC 2500 on 1/4 - Outpatient f/u with PCP/oncology after discharge   Hypokalemia - resolved - repeat K+ 3.9 on 07/07/21    Questionable urinary frequency - UA on 07/09/21 WNL except for straw color   4. Discharge Planning:              -- Social work and case management to assist with discharge planning and identification of hospital follow-up needs prior to discharge.              -- Discharge Concerns: Need to establish a safety plan; Medication compliance and effectiveness  PGY-2 Armando Reichert, MD 07/12/2021, 3:37 PM

## 2021-07-12 NOTE — Progress Notes (Signed)
°  On assessment pt presents with complaints of a burning, sore throat.  Writer received in report that pt refused to take Mylanta, referring to it as the "chalky stuff."  Pt also complained of Left lower abdominal pain.  On call provider was notified.  Pt states, "I have a lot of urinary and GI issues; I'm trying to take care of my medical now."  Pt state's, "I've been having to urinate more immediately lately."  Pt endorses mild anxiety.  Per pt, "I don't like the way the MHT is treating me."  Pt becomes irritated when MHT requests she not move chairs around in the day room and when the tv channel is changed when she returns from bathroom.  Pt denies SI/HI and verbally contracts for safety.  Pt denies AVH.    PRN Pepcid administered per [facility] per pt request.  Pt provided reassurance.  Pt is safe on unit with Q 15 minute safety checks.   07/11/21 2200  Psych Admission Type (Psych Patients Only)  Admission Status Involuntary  Psychosocial Assessment  Patient Complaints Anxiety;Suspiciousness;Irritability  Eye Contact Fair  Facial Expression Pensive  Affect Anxious;Irritable;Preoccupied  Speech Argumentative;Logical/coherent  Interaction Assertive;Dominating;Demanding  Motor Activity Slow  Appearance/Hygiene Unremarkable  Behavior Characteristics Cooperative  Mood Anxious;Suspicious  Thought Process  Coherency Concrete thinking  Content Religiosity;Paranoia  Delusions Controlled;Paranoid;Persecutory;Religious  Perception Derealization  Hallucination None reported or observed  Judgment Poor  Confusion None  Danger to Self  Current suicidal ideation? Denies  Danger to Others  Danger to Others None reported or observed

## 2021-07-12 NOTE — [facility] Group Notes (Signed)
[place] Group Notes:  (Nursing/MHT/Case Management/Adjunct)  Date:  07/12/2021  Time:  10:09 AM  Type of Therapy:   Orientation/Goals group  Participation Level:  Active  Participation Quality:  Appropriate  Affect:  Appropriate  Cognitive:  Appropriate  Insight:  Good and Improving  Engagement in Group:  Engaged and Improving  Modes of Intervention:  Discussion, Education, and Orientation  Summary of Progress/Problems: Pt goal for today is get out of here and move forward with her life.   Gabrial Poppell J Febe Champa 07/12/2021, 10:09 AM

## 2021-07-12 NOTE — Group Note (Signed)
°  [facility] LCSW Group Therapy Note  Date/Time:  07/12/2021 11:15AM-12:00PM  Type of Therapy and Topic:  Group Therapy:  Self-Care after Hospitalization  Participation Level:  Active   Description of Group This process group involved patients discussing how they plan to take care of themselves in a better manner when they get home from the hospital.  The group started with patients listing one healthy and one unhealthy way they took care of themselves prior to hospitalization.  A discussion ensued about the differences in healthy and unhealthy coping skills.  Group members shared ideas about making changes when they return home so that they can stay well and in recovery.  The white board was used to list ideas so that patients can continue to see these ideas throughout the day.  Therapeutic Goals Patient will identify and describe one healthy and one unhealthy coping technique used prior to hospitalization Patient will participate in generating ideas about healthy self-care options when they return to the community Patients will be supportive of one another and receive said support from others Patient will identify one healthy self-care activity to add to his/her post-hospitalization life that can help in recovery  Summary of Patient Progress:  The patient expressed that prior to hospitalization some healthy self-care activity she engaged in was walking, while unhealthy self-care activity included bad hygiene.  Patient's participation in group was helpful.   Patient identified good hygiene practices as another self-care activity to add in her pursuit of recovery post-discharge.   Therapeutic Modalities Brief Solution-Focused Therapy Motivational Interviewing Psychoeducation       Read Drivers, Latanya Presser 07/12/2021  2:20 PM

## 2021-07-12 NOTE — [facility] Group Notes (Signed)
Adult Psychoeducational Group Not Date:  07/12/2021 Time:  0900-1045 Group Topic/Focus: PROGRESSIVE RELAXATION. A group where deep breathing is taught and tensing and relaxation muscle groups is used. Imagery is used as well.  Pts are asked to imagine 3 pillars that hold them up when they are not able to hold themselves up.  Participation Level:  Active  Participation Quality:  Appropriate  Affect:  Appropriate  Cognitive:  Oriented  Insight: Improving  Engagement in Group:  Engaged  Modes of Intervention:  Activity, Discussion, Education, and Support  Additional Comments:  Pt rates her energy as a 5/10.  States her pillars are [name], Jesus and her faith in [place], [place]

## 2021-07-12 NOTE — Plan of Care (Signed)
°  Problem: Education: °Goal: Emotional status will improve °Outcome: Progressing °Goal: Mental status will improve °Outcome: Progressing °  °Problem: Activity: °Goal: Interest or engagement in activities will improve °Outcome: Progressing °  °

## 2021-07-13 ENCOUNTER — Encounter (HOSPITAL_COMMUNITY): Payer: Self-pay

## 2021-07-13 NOTE — Group Note (Signed)
Recreation Therapy Group Note   Group Topic:Healthy Decision Making  Group Date: 07/13/2021 Start Time: 1031 End Time: [id number] Facilitators: Victorino Sparrow, LRT,CTRS Location: [address] Dayroom   Goal Area(s) Addresses:  Patient will effectively work with peer towards shared goal.  Patient will identify factors that guided their decision making.  Patient will pro-socially communicate ideas during group session.   Group Description: Patients were given a scenario that they were going to be stranded on a deserted [place] for several months before being rescued. Writer tasked them with making a list of 15 things they would choose to bring with them for "survival". The list of items was prioritized most important to least. Each patient would come up with their own list, then work together to create a new list of 15 items while in a group of 3-5 peers. LRT discussed each person's list and how it differed from others. The debrief included discussion of priorities, good decisions versus bad decisions, and how it is important to think before acting so we can make the best decision possible. LRT tied the concept of effective communication among group members to patient's support systems outside of the hospital and its benefit post discharge.   Affect/Mood: N/A   Participation Level: Did not attend    Clinical Observations/Individualized Feedback:     Plan: Continue to engage patient in RT group sessions 2-3x/week.   Victorino Sparrow, LRT,CTRS 07/13/2021 12:40 PM

## 2021-07-13 NOTE — Progress Notes (Signed)
Adult Psychoeducational Group Note  Date:  07/13/2021 Time:  8:28 PM  Group Topic/Focus:  Wrap-Up Group:   The focus of this group is to help patients review their daily goal of treatment and discuss progress on daily workbooks.  Participation Level:  Did Not Attend  Participation Quality:   Did Not Attend  Affect:   Did Not Attend  Cognitive:   Did Not Attend  Insight: None  Engagement in Group:   Did Not Attend  Modes of Intervention:   Did Not Attend  Additional Comments:  Pt did not attend evening wrap up group tonight.  [name] 07/13/2021, 8:28 PM

## 2021-07-13 NOTE — Progress Notes (Incomplete)
Patient denies SI, HI and AVH.  Patient became irritable when confronted by the doctor about discharge.  Patient insisted on being able to discharge today

## 2021-07-13 NOTE — Progress Notes (Addendum)
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   Case was discussed in the multidisciplinary team. MAR was reviewed and patient was compliant with medications.   Psychiatric Team made the following recommendations yesterday: -- Increase prolixin to $RemoveBef'10mg'syrhFpTkWY$  PO daily and 12.5 mg at 6 PM or $Rem'5mg'nOPW$  IM q12h with forced medication over objection protocol in place.- will monitor for return of tremor with dose increase -- Continue Ativan 0.25 mg twice daily for report of restlessness on Prolixin  - Continue as needed agitation protocol of Haldol 5 mg every 8 hours p.o. or IM, with/ or Ativan 1 mg every 8 hours p.o. or IM and Benadryl $RemoveBefo'50mg'rgKhgYGgWDO$  po or IM every 6 hours PRN agitation - Continue Cogentin 0.5 mg twice daily for EPS (Scheduled Colace $RemoveBeforeDEI'100mg'FLqdtwAVrGwWWhVv$  qd PRN for constipation) -- Repeat EKG on 1/5 shows QTC 463ms -- ACTT meeting with patient- Tuesday   Objectively patient is more irritated today than on Friday and evading provider by going to the bathroom. Subjectively patient endorses that she is still sleeping and eating well and reports that it is "stupid" that she is asked about SI or HI. Patient denies SI and HI and endorses AVH but refuses to give much detail. Patient reports that AVH are a part of her being a " an Film/video editor, prophet to the nations, and a biblically based Chief Strategy Officer."   Principal Problem: Schizophrenia spectrum disorder with psychotic disorder type not yet determined ([place]) Diagnosis: Principal Problem:   Schizophrenia spectrum disorder with psychotic disorder type not yet determined ([place]) Active Problems:   Brain  metastasis ([place])   Melanoma of skin ([place])   Leukopenia   Thrombocytopenia ([name])  Total Time spent with patient:Total Time spent with patient: I personally spent 30 minutes on the unit in direct patient care. The direct patient care time included face-to-face time with the patient, reviewing the patient's chart, communicating with other professionals, and coordinating care. Greater than 50% of this time was spent in counseling or coordinating care with the patient regarding goals of hospitalization, psycho-education, and discharge planning needs.    Past Psychiatric History:  See H&P  Past Medical History:  Past Medical History:  Diagnosis Date   Allergy    Anemia    Anemia    Diabetes mellitus without complication ([place])    prediabetic   GERD (gastroesophageal reflux disease)    melanoma with met dz dx'd 01/2018   brain and adrenal   Seasonal allergies     Past Surgical History:  Procedure Laterality Date   BIOPSY  01/28/2018   Procedure: BIOPSY;  Surgeon: Laurence Spates, MD;  Location: [facility]OS[facility];  Service: Endoscopy;;   BREAST BIOPSY Left    ESOPHAGOGASTRODUODENOSCOPY N/A 01/28/2018   Procedure: ESOPHAGOGASTRODUODENOSCOPY (EGD);  Surgeon: Laurence Spates, MD;  Location: [facility];  Service: Endoscopy;  Laterality: N/A;   MOUTH SURGERY     OVARY SURGERY     removed "something"   Family History:  Family History  Problem Relation Age of Onset   Melanoma Mother    Hypertension Father    Breast cancer Maternal Aunt    Breast cancer Paternal Aunt  Diabetes Paternal Aunt    Breast cancer Maternal Grandmother    Breast cancer Paternal Grandmother    Diabetes Paternal Grandmother    Colon cancer Neg Hx    Esophageal cancer Neg Hx    Pancreatic cancer Neg Hx    Stomach cancer Neg Hx    Liver disease Neg Hx    Rectal cancer Neg Hx    Family Psychiatric  History: See H&P  Social History:  Social History   Substance and Sexual Activity  Alcohol Use No     Social  History   Substance and Sexual Activity  Drug Use No    Social History   Socioeconomic History   Marital status: Single    Spouse name: Not on file   Number of children: Not on file   Years of education: Not on file   Highest education level: Not on file  Occupational History   Not on file  Tobacco Use   Smoking status: Never   Smokeless tobacco: Never  Vaping Use   Vaping Use: Never used  Substance and Sexual Activity   Alcohol use: No   Drug use: No   Sexual activity: Not Currently  Other Topics Concern   Not on file  Social History Narrative   Not on file   Social Determinants of Health   Financial Resource Strain: Not on file  Food Insecurity: Not on file  Transportation Needs: Not on file  Physical Activity: Not on file  Stress: Not on file  Social Connections: Not on file   Sleep: Fair  Appetite:  Good  Current Medications: Current Facility-Administered Medications  Medication Dose Route Frequency Provider Last Rate Last Admin   acetaminophen (TYLENOL) tablet 650 mg  650 mg Oral Q6H PRN Rosezetta Schlatter, MD   650 mg at 07/12/21 1348   benztropine (COGENTIN) tablet 0.5 mg  0.5 mg Oral BID Damita Dunnings B, MD   0.5 mg at 07/13/21 0835   diphenhydrAMINE (BENADRYL) capsule 50 mg  50 mg Oral Q6H PRN Damita Dunnings B, MD       Or   diphenhydrAMINE (BENADRYL) injection 50 mg  50 mg Intramuscular Q6H PRN Damita Dunnings B, MD       docusate sodium (COLACE) capsule 100 mg  100 mg Oral Daily Doda, Vandana, MD   100 mg at 07/13/21 0835   feeding supplement (ENSURE ENLIVE / ENSURE PLUS) liquid 237 mL  237 mL Oral BID BM McQuilla, Jai B, MD   237 mL at 07/13/21 1435   fluPHENAZine (PROLIXIN) tablet 10 mg  10 mg Oral Daily Damita Dunnings B, MD   10 mg at 07/13/21 1610   Or   fluPHENAZine (PROLIXIN) injection 5 mg  5 mg Intramuscular Daily Damita Dunnings B, MD       fluPHENAZine (PROLIXIN) tablet 12.5 mg  12.5 mg Oral Q2000 Doda, Vandana, MD   12.5 mg at 07/12/21 1704   Or    fluPHENAZine (PROLIXIN) injection 5 mg  5 mg Intramuscular Q2000 Doda, Vandana, MD       haloperidol (HALDOL) tablet 5 mg  5 mg Oral Q8H PRN Nelda Marseille, Jeremih Dearmas E, MD       Or   haloperidol lactate (HALDOL) injection 5 mg  5 mg Intramuscular Q8H PRN Nelda Marseille, Niza Soderholm E, MD       LORazepam (ATIVAN) tablet 0.25 mg  0.25 mg Oral BID Armando Reichert, MD   0.25 mg at 07/13/21 0837   Or   LORazepam (ATIVAN) injection 0.25  mg  0.25 mg Intramuscular BID Armando Reichert, MD       LORazepam (ATIVAN) tablet 1 mg  1 mg Oral Q8H PRN Nelda Marseille, Ericka Marcellus E, MD       Or   LORazepam (ATIVAN) injection 1 mg  1 mg Intramuscular Q8H PRN Harlow Asa, MD        Lab Results: No results found for this or any previous visit (from the past 28 hour(s)).  Blood Alcohol level:  Lab Results  Component Value Date   ETH <10 06/25/2021   ETH <10 19/50/9326    Metabolic Disorder Labs: Lab Results  Component Value Date   HGBA1C 5.1 06/28/2021   MPG 99.67 06/28/2021   MPG 119.76 02/03/2018   No results found for: PROLACTIN Lab Results  Component Value Date   CHOL 212 (H) 06/28/2021   TRIG 56 06/28/2021   HDL 63 06/28/2021   CHOLHDL 3.4 06/28/2021   VLDL 11 06/28/2021   LDLCALC 138 (H) 06/28/2021   LDLCALC 91 02/03/2018    Physical Findings: AIMS: Facial and Oral Movements Muscles of Facial Expression: None, normal Lips and Perioral Area: None, normal Jaw: None, normal Tongue: None, normal,Extremity Movements Upper (arms, wrists, hands, fingers): None, normal Lower (legs, knees, ankles, toes): None, normal, Trunk Movements Neck, shoulders, hips: None, normal, Overall Severity Severity of abnormal movements (highest score from questions above): None, normal Incapacitation due to abnormal movements: None, normal Patient's awareness of abnormal movements (rate only patient's report): No Awareness, Dental Status Current problems with teeth and/or dentures?: No Does patient usually wear dentures?: No     Musculoskeletal: Strength & Muscle Tone: within normal limits Gait & Station: normal Patient leans: N/A  Psychiatric Specialty Exam: Presentation  General Appearance: Casual with fair hygiene   Eye Contact:Minimal   Speech:Clear and Coherent - rambling less today, very abrupt in speech    Speech Volume:Normal   Handedness:Right     Mood and Affect  Mood: Angry, irritable, aloof   Affect:constricted, irritable and angry, defensive at times     Thought Process  Thought Processes:Concrete, ruminative    Orientation: would not cooperate for questioning   Thought Content:makes frequent hyper-religious statements and has grandiose delusions; reports AVH, appears paranoid on exam; is not grossly responding to internal/external stimuli   History of Schizophrenia/Schizoaffective disorder:Yes   Duration of Psychotic Symptoms:Greater than six months   Hallucinations: AVH as noted above   Ideas of Reference:denied   Suicidal Thoughts:Suicidal Thoughts: No   Homicidal Thoughts:Homicidal Thoughts: No     Sensorium  Memory:Poor   Judgment:- Poor   Insight:Poor     Executive Functions  Concentration:Poor   Attention Span:Poor   Recall:Poor   Fund of Knowledge:Limited   Language:Fair     Psychomotor Activity  Psychomotor Activity :intermittent tremors resolved at rest; no truncal movements; no oral/facial movements noted, steady gait   Assets  Assets:Resilience, has guardian     Sleep  Not charted  Physical Exam HENT:     Head: Normocephalic and atraumatic.  Pulmonary:     Effort: Pulmonary effort is normal.  Neurological:     Mental Status: She is alert.   Review of Systems  Gastrointestinal:  Negative for constipation.  Genitourinary:  Positive for frequency.  Psychiatric/Behavioral:  Negative for depression and suicidal ideas. The patient does not have insomnia.   Blood pressure 133/89, pulse 70, temperature 97.7 F (36.5 C), temperature  source Oral, resp. rate 18, height $RemoveBe'5\' 1"'lFexmlFOp$  (1.549 m), weight 56 kg, SpO2 99 %.  Body mass index is 23.32 kg/m.   Treatment Plan Summary: Daily contact with patient to assess and evaluate symptoms and progress in treatment and Medication management  The patient is a 51 year old female with a reported history of bipolar disorder and unspecified psychosis, and a medical history of melanoma with metastases to the brain, admitted under IVC for bizarre behaviors, agitation, delusions, and hyperreligiosity.  Patient behavior slowly improving with her Prolixin being readjusted, the patient is definitely more irritable and again evading providers.  Patient's hyperreligious delusions continue and she is not specific about her AVH but does not adamantly deny it.  Patient again appears to be more isolative but this will likely improve as her Prolixin level stabilizes again.  Patient appears to be ambulating well and does not appear to have tremor on current Ativan dose. Patient endorses increasing frequency with urination this is likely 2/2 abnormalities seen on abdominal ultrasound and will need to be follow-up outpatient with her oncologist.  Thus far patient's UA has been negative decreasing concern for UTI.  Safety and Monitoring: INVOLUTARILY (by [employer]) admission to inpatient psychiatric unit for safety, stabilization and treatment Daily contact with patient to assess and evaluate symptoms and progress in treatment Patient's case to be discussed in multi-disciplinary team meeting Observation Level : q15 minute checks Vital signs: q12 hours Precautions: suicide, elopement, and assault   2. Psychiatric Problems #Unspecified schizophrenia spectrum and other psychotic d/o (r/o psychosis secondary to general medical condition - metatstatic melanoma vs bipolar d/o MRE manic with psychotic features) -- Continue prolixin $RemoveBeforeDEI'10mg'PRiwjzhcsGrbCugi$  PO daily and 12.5 mg at 6 PM or $Rem'5mg'KhIL$  IM q12h with forced medication over objection protocol  in place.- will monitor for return of tremor with dose increase -- Continue Ativan 0.25 mg twice daily for report of restlessness on Prolixin  - Continue as needed agitation protocol of Haldol 5 mg every 8 hours p.o. or IM, with/ or Ativan 1 mg every 8 hours p.o. or IM and Benadryl $RemoveBefo'50mg'LYxrSSJkkGu$  po or IM every 6 hours PRN agitation - Continue Cogentin 0.5 mg twice daily for EPS (Scheduled Colace $RemoveBeforeDEI'100mg'UdltwXGKbphizmMj$  qd PRN for constipation) -- Repeat EKG on 1/5 shows QTC 484ms -- ACTT meeting with patient- Tuesday   3. Medical Management   #History of metastatic melanoma Metastases to the brain and adrenals first reported in 2019, and patient had full brain irradiation around that time.  Patient was with regular follow-up until April of this year, and was lost to follow-up for her July appointment and beyond. - Reached out to oncology: Per Dr. Lindi Adie- Verify with guardian whether she wants to go through treatment. If she wants to go through treatment, she will need a Whole body scan and to follow up in clinic outpatient; if not, consider hospice.  -- Obtained CT CAP with contrast and CT head with and without contrast  for surveillance (results showed "bilateral adrenal nodules that are decreased in size when compared with most recent prior exam, soft tissue lesion of the gallbladder increased in size when compared with prior exams, unchanged indeterminate hyperenhancing lesions of the caudate lobe of the liver and spleen, endometrial thickening which is increased compared with prior exam recommend further evaluation with pelvic ultrasound, and head CT shows no definite enhancing lesion or other acute intracranial process.") -- Guardian and patient made it clear patient would like treatment             -- Oncology Appt made: 08/04/2021 at 10:30 am W/ Dr. Lorenza Cambridge his RN- Will  ask Dr. Alen Blew to schedule and his recommendations             -- GI Appt with PA at [facility]-- 1/20 at 10 am  Guardian has been made aware    #Thrombocytopenia, stable #Leukopenia, resolved Thrombocytopenia Appears chronic - platelets 68 on 1/4 WBC 4.4 with ANC 2500 on 1/4 - Outpatient f/u with PCP/oncology after discharge   Hypokalemia - resolved - repeat K+ 3.9 on 07/07/21    Questionable urinary frequency - UA on 07/09/21 WNL except for straw color   4. Discharge Planning:              -- Social work and case management to assist with discharge planning and identification of hospital follow-up needs prior to discharge.              -- Discharge Concerns: Need to establish a safety plan; Medication compliance and effectiveness   PGY-2 Freida Busman, MD 07/13/2021, 4:36 PM

## 2021-07-13 NOTE — BH IP Treatment Plan (Signed)
Interdisciplinary Treatment and Diagnostic Plan Update  07/13/2021 Time of Session: 11:10am Kaitlyn Duncan MRN: [id number]  Principal Diagnosis: Schizophrenia spectrum disorder with psychotic disorder type not yet determined [facility])  Secondary Diagnoses: Principal Problem:   Schizophrenia spectrum disorder with psychotic disorder type not yet determined ([place]) Active Problems:   Brain metastasis ([place])   Melanoma of skin ([place])   Leukopenia   Thrombocytopenia ([place])   Current Medications:  Current Facility-Administered Medications  Medication Dose Route Frequency Provider Last Rate Last Admin   acetaminophen (TYLENOL) tablet 650 mg  650 mg Oral Q6H PRN Rosezetta Schlatter, MD   650 mg at 07/12/21 1348   benztropine (COGENTIN) tablet 0.5 mg  0.5 mg Oral BID Damita Dunnings B, MD   0.5 mg at 07/13/21 0835   diphenhydrAMINE (BENADRYL) capsule 50 mg  50 mg Oral Q6H PRN Freida Busman, MD       Or   diphenhydrAMINE (BENADRYL) injection 50 mg  50 mg Intramuscular Q6H PRN Damita Dunnings B, MD       docusate sodium (COLACE) capsule 100 mg  100 mg Oral Daily Armando Reichert, MD   100 mg at 07/13/21 0835   feeding supplement (ENSURE ENLIVE / ENSURE PLUS) liquid 237 mL  237 mL Oral BID BM Damita Dunnings B, MD   237 mL at 07/12/21 1347   fluPHENAZine (PROLIXIN) tablet 10 mg  10 mg Oral Daily Damita Dunnings B, MD   10 mg at 07/13/21 8182   Or   fluPHENAZine (PROLIXIN) injection 5 mg  5 mg Intramuscular Daily Damita Dunnings B, MD       fluPHENAZine (PROLIXIN) tablet 12.5 mg  12.5 mg Oral Q2000 Rosita Kea, Vandana, MD   12.5 mg at 07/12/21 1704   Or   fluPHENAZine (PROLIXIN) injection 5 mg  5 mg Intramuscular Q2000 Doda, Vandana, MD       haloperidol (HALDOL) tablet 5 mg  5 mg Oral Q8H PRN Harlow Asa, MD       Or   haloperidol lactate (HALDOL) injection 5 mg  5 mg Intramuscular Q8H PRN Nelda Marseille, Amy E, MD       LORazepam (ATIVAN) tablet 0.25 mg  0.25 mg Oral BID Armando Reichert, MD   0.25 mg at 07/13/21 9937    Or   LORazepam (ATIVAN) injection 0.25 mg  0.25 mg Intramuscular BID Armando Reichert, MD       LORazepam (ATIVAN) tablet 1 mg  1 mg Oral Q8H PRN Harlow Asa, MD       Or   LORazepam (ATIVAN) injection 1 mg  1 mg Intramuscular Q8H PRN Harlow Asa, MD       PTA Medications: Medications Prior to Admission  Medication Sig Dispense Refill Last Dose   clotrimazole (LOTRIMIN) 1 % cream Apply to affected area 2 times daily (Patient not taking: Reported on 06/25/2021) 15 g 0    fluticasone (FLONASE) 50 MCG/ACT nasal spray Place 2 sprays into both nostrils daily. (Patient not taking: Reported on 06/25/2021) 16 g 2    hydrocortisone cream 1 % Apply to affected area 2 times daily (Patient not taking: Reported on 06/25/2021) 15 g 0     Patient Stressors: Marital or family conflict   Medication change or noncompliance    Patient Strengths: General fund of knowledge  Motivation for treatment/growth   Treatment Modalities: Medication Management, Group therapy, Case management,  1 to 1 session with clinician, Psychoeducation, Recreational therapy.   Physician Treatment Plan for Primary Diagnosis: Schizophrenia  spectrum disorder with psychotic disorder type not yet determined ([place]) Long Term Goal(s): Improvement in symptoms so as ready for discharge   Short Term Goals: Ability to identify changes in lifestyle to reduce recurrence of condition will improve Ability to identify and develop effective coping behaviors will improve Ability to maintain clinical measurements within normal limits will improve Compliance with prescribed medications will improve Ability to verbalize feelings will improve Ability to demonstrate self-control will improve  Medication Management: Evaluate patient's response, side effects, and tolerance of medication regimen.  Therapeutic Interventions: 1 to 1 sessions, Unit Group sessions and Medication administration.  Evaluation of Outcomes:  Progressing  Physician Treatment Plan for Secondary Diagnosis: Principal Problem:   Schizophrenia spectrum disorder with psychotic disorder type not yet determined ([place]) Active Problems:   Brain metastasis ([place])   Melanoma of skin ([place])   Leukopenia   Thrombocytopenia ([place])  Long Term Goal(s): Improvement in symptoms so as ready for discharge   Short Term Goals: Ability to identify changes in lifestyle to reduce recurrence of condition will improve Ability to identify and develop effective coping behaviors will improve Ability to maintain clinical measurements within normal limits will improve Compliance with prescribed medications will improve Ability to verbalize feelings will improve Ability to demonstrate self-control will improve     Medication Management: Evaluate patient's response, side effects, and tolerance of medication regimen.  Therapeutic Interventions: 1 to 1 sessions, Unit Group sessions and Medication administration.  Evaluation of Outcomes: Progressing   RN Treatment Plan for Primary Diagnosis: Schizophrenia spectrum disorder with psychotic disorder type not yet determined ([employer]) Long Term Goal(s): Knowledge of disease and therapeutic regimen to maintain health will improve  Short Term Goals: Ability to remain free from injury will improve, Ability to verbalize frustration and anger appropriately will improve, Ability to demonstrate self-control, Ability to participate in decision making will improve, and Compliance with prescribed medications will improve  Medication Management: RN will administer medications as ordered by provider, will assess and evaluate patient's response and provide education to patient for prescribed medication. RN will report any adverse and/or side effects to prescribing provider.  Therapeutic Interventions: 1 on 1 counseling sessions, Psychoeducation, Medication administration, Evaluate responses to treatment, Monitor vital signs and CBGs  as ordered, Perform/monitor CIWA, COWS, AIMS and Fall Risk screenings as ordered, Perform wound care treatments as ordered.  Evaluation of Outcomes: Progressing   LCSW Treatment Plan for Primary Diagnosis: Schizophrenia spectrum disorder with psychotic disorder type not yet determined ([place]) Long Term Goal(s): Safe transition to appropriate next level of care at discharge, Engage patient in therapeutic group addressing interpersonal concerns.  Short Term Goals: Engage patient in aftercare planning with referrals and resources, Increase ability to appropriately verbalize feelings, Facilitate acceptance of mental health diagnosis and concerns, Facilitate patient progression through stages of change regarding substance use diagnoses and concerns, Identify triggers associated with mental health/substance abuse issues, and Increase skills for wellness and recovery  Therapeutic Interventions: Assess for all discharge needs, 1 to 1 time with Social worker, Explore available resources and support systems, Assess for adequacy in community support network, Educate family and significant other(s) on suicide prevention, Complete Psychosocial Assessment, Interpersonal group therapy.  Evaluation of Outcomes: Progressing   Progress in Treatment: Attending groups: Yes and no. Participating in groups: Yes. Taking medication as prescribed: Yes. Toleration medication: Yes. Family/Significant other contact made: Yes, individual(s) contacted:  guardain Patient understands diagnosis: No. Discussing patient identified problems/goals with staff: No. Medical problems stabilized or resolved: Yes. Denies suicidal/homicidal ideation: Yes.  Issues/concerns per patient self-inventory: No. Other: None   New problem(s) identified: No, Describe:  None   New Short Term/Long Term Goal(s): medication stabilization, elimination of SI thoughts, development of comprehensive mental wellness plan.    Patient Goals:  Did Not  Attend   Discharge Plan or Barriers:  Patient is to be referred to ACTT for follow up at discharge   Reason for Continuation of Hospitalization: Delusions  Medication stabilization   Estimated Length of Stay: 3-5 days   Scribe for Treatment Team: Vassie Moselle, LCSW 07/13/2021 11:16 AM

## 2021-07-13 NOTE — Progress Notes (Signed)
D:  [name] was in her room all evening.  RN attempted to assess and asked her if she needed this evening, she answered "sleep."  She did not open her eyes and would not answer any further questions.  She did not attend evening wrap up group and did not get up for bedtime snacks.  She denied any pain or discomfort and appeared to be in no acute distress.  No prn's requested at this time. A:  1:1 with RN for support and encouragement.  Medications given as ordered.  Q 15 minute checks maintained for safety.  Encouraged participation in group and unit activities.   R:  She is currently resting with her eyes closed and appears to be asleep.  She remains safe on the unit.  We will continue to monitor the progress towards her goals.

## 2021-07-13 NOTE — Progress Notes (Signed)
Pt was encouraged but didn't attend orientation/goals group.

## 2021-07-13 NOTE — Progress Notes (Signed)
°   07/13/21 2200  Psych Admission Type (Psych Patients Only)  Admission Status Involuntary  Psychosocial Assessment  Patient Complaints None  Eye Contact Fair  Facial Expression Animated  Affect Preoccupied  Speech Logical/coherent  Interaction Avoidant  Motor Activity Slow  Appearance/Hygiene Unremarkable  Behavior Characteristics Calm  Mood Sad  Thought Process  Coherency Concrete thinking  Content Paranoia  Delusions Paranoid;Persecutory  Perception Derealization  Hallucination None reported or observed  Judgment Impaired  Confusion None  Danger to Self  Current suicidal ideation? Denies  Danger to Others  Danger to Others None reported or observed

## 2021-07-13 NOTE — Group Note (Signed)
LCSW Group Therapy Notes  Type of Therapy and Topic: Group Therapy: Healthy Vs. Unhealthy Coping Strategies  Date and Time: 07/13/2021 at 1:00pm  Participation Level: [facility] PARTICIPATION LEVEL: Did Not Attend  Description of Group:  In this group, patients will be encouraged to explore their healthy and unhealthy coping strategics. Coping strategies are actions that we take to deal with stress, problems, or uncomfortable emotions in our daily lives. Each patient will be challenged to read some scenarios and discuss the unhealthy and healthy coping strategies within those scenarios. Also, each patient will be challenged to describe current healthy and unhealthy strategies that they use in their own lives and discuss the outcomes and barriers to those strategies. This group will be process-oriented, with patients participating in exploration of their own experiences as well as giving and receiving support and challenge from other group members.  Therapeutic Goals: Patient will identify personal healthy and unhealthy coping strategies. Patient will identify healthy and unhealthy coping strategies, in others, through scenarios.  Patient will identify expected outcomes of healthy and unhealthy coping strategies. Patient will identify barriers to using healthy coping strategies.   Summary of Patient Progress: Did not attend   Therapeutic Modalities:  Cognitive Behavioral Therapy Solution Focused Therapy Motivational Interviewing  Darletta Moll MSW, [place] Worker  [facility]

## 2021-07-13 NOTE — [facility] Group Notes (Signed)
Pt didn't attend group. 

## 2021-07-14 DIAGNOSIS — F29 Unspecified psychosis not due to a substance or known physiological condition: Secondary | ICD-10-CM | POA: Diagnosis not present

## 2021-07-14 NOTE — Progress Notes (Signed)
°   07/14/21 2100  Psych Admission Type (Psych Patients Only)  Admission Status Involuntary  Psychosocial Assessment  Patient Complaints None  Eye Contact Glaring  Facial Expression Animated  Affect Preoccupied  Speech Logical/coherent  Interaction Assertive  Motor Activity Slow  Appearance/Hygiene Unremarkable  Behavior Characteristics Calm  Mood Sad  Thought Process  Coherency Concrete thinking  Content Paranoia  Delusions Paranoid;Persecutory  Perception Derealization  Hallucination None reported or observed  Judgment Impaired  Confusion None  Danger to Self  Current suicidal ideation? Denies  Danger to Others  Danger to Others None reported or observed

## 2021-07-14 NOTE — Progress Notes (Signed)
Pt was encouraged but didn't attend orientation/goals group.

## 2021-07-14 NOTE — Progress Notes (Signed)
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   Case was discussed in the multidisciplinary team. MAR was reviewed and patient was compliant with medications.    Psychiatric Team made the following recommendations yesterday: -- Continue prolixin 76m PO daily and 12.5 mg at 6 PM or 59mIM q12h with forced medication over objection protocol in place.- will monitor for return of tremor with dose increase -- Continue Ativan 0.25 mg twice daily for report of restlessness on Prolixin  - Continue as needed agitation protocol of Haldol 5 mg every 8 hours p.o. or IM, with/ or Ativan 1 mg every 8 hours p.o. or IM and Benadryl 5026mo or IM every 6 hours PRN agitation - Continue Cogentin 0.5 mg twice daily for EPS (Scheduled Colace 100m68m PRN for constipation) -- Repeat EKG on 1/5 shows QTC 414ms2mACTT meeting with patient- Tuesday  RN reports that patient is noticeably more pleasant with RN and techs, but has been seen more rude with MD. Objectively providers noticed today patient is more reluctant to participate in crucial parts of assessment but is more willing to talk about other things. Patient also appears to be participating some more on the unit and her general behavior is improving.  Subjectively patient is willing to reports that she sleeping well and continues to have increased urinary frequency. Patient reports that she enjoyed group this AM and recalls what the group was about. 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 Patient refuses to answer SI, HI, and AVH but does not endorse and looks irritated when this assessed.  Principal Problem: Schizophrenia spectrum disorder with psychotic disorder type not yet determined (HCC) [place]osis: Principal Problem:   Schizophrenia spectrum disorder with psychotic disorder type not yet determined (HCC) [place] Problems:   Brain metastasis (HCC) Strattonelanoma of skin (HCC) [place]openia   Thrombocytopenia (HCC) [place] Time spent with patient: 15 minutes  Past Psychiatric History: See H&P  Past Medical History:  Past Medical History:  Diagnosis Date   Allergy    Anemia    Anemia    Diabetes mellitus without complication (HCC) [place]rediabetic   GERD (gastroesophageal reflux disease)    melanoma with met dz dx'd 01/2018   brain and adrenal   Seasonal allergies     Past Surgical History:  Procedure Laterality Date   BIOPSY  01/28/2018   Procedure: BIOPSY;  Surgeon: EdwarLaurence Spates  Location: [facility];  Service: Endoscopy;;   BREAST BIOPSY Left    ESOPHAGOGASTRODUODENOSCOPY N/A 01/28/2018   Procedure: ESOPHAGOGASTRODUODENOSCOPY (EGD);  Surgeon: EdwarLaurence Spates  [facility];  Service: Endoscopy;  Laterality: N/A;   MOUTH SURGERY     OVARY SURGERY     removed "something"   Family History:  Family History  Problem Relation Age of Onset   Melanoma Mother    Hypertension Father  Breast cancer Maternal Aunt    Breast cancer Paternal Aunt    Diabetes Paternal Aunt    Breast cancer Maternal Grandmother    Breast cancer Paternal Grandmother    Diabetes Paternal Grandmother    Colon cancer Neg Hx    Esophageal cancer Neg Hx    Pancreatic cancer Neg Hx    Stomach cancer Neg Hx    Liver disease Neg Hx    Rectal cancer Neg Hx    Family Psychiatric  History:  See H&P Social History:  Social History   Substance and  Sexual Activity  Alcohol Use No     Social History   Substance and Sexual Activity  Drug Use No    Social History   Socioeconomic History   Marital status: Single    Spouse name: Not on file   Number of children: Not on file   Years of education: Not on file   Highest education level: Not on file  Occupational History   Not on file  Tobacco Use   Smoking status: Never   Smokeless tobacco: Never  Vaping Use   Vaping Use: Never used  Substance and Sexual Activity   Alcohol use: No   Drug use: No   Sexual activity: Not Currently  Other Topics Concern   Not on file  Social History Narrative   Not on file   Social Determinants of Health   Financial Resource Strain: Not on file  Food Insecurity: Not on file  Transportation Needs: Not on file  Physical Activity: Not on file  Stress: Not on file  Social Connections: Not on file   Additional Social History:                         Sleep: Good  Appetite:  Good  Current Medications: Current Facility-Administered Medications  Medication Dose Route Frequency Provider Last Rate Last Admin   acetaminophen (TYLENOL) tablet 650 mg  650 mg Oral Q6H PRN Rosezetta Schlatter, MD   650 mg at 07/12/21 1348   benztropine (COGENTIN) tablet 0.5 mg  0.5 mg Oral BID Damita Dunnings B, MD   0.5 mg at 07/14/21 0727   diphenhydrAMINE (BENADRYL) capsule 50 mg  50 mg Oral Q6H PRN Damita Dunnings B, MD       Or   diphenhydrAMINE (BENADRYL) injection 50 mg  50 mg Intramuscular Q6H PRN Damita Dunnings B, MD       docusate sodium (COLACE) capsule 100 mg  100 mg Oral Daily Doda, Vandana, MD   100 mg at 07/14/21 0727   feeding supplement (ENSURE ENLIVE / ENSURE PLUS) liquid 237 mL  237 mL Oral BID BM Juwana Thoreson B, MD   237 mL at 07/14/21 1000   fluPHENAZine (PROLIXIN) tablet 10 mg  10 mg Oral Daily Damita Dunnings B, MD   10 mg at 07/14/21 0370   Or   fluPHENAZine (PROLIXIN) injection 5 mg  5 mg Intramuscular Daily Damita Dunnings B, MD        fluPHENAZine (PROLIXIN) tablet 12.5 mg  12.5 mg Oral Q2000 Doda, Vandana, MD   12.5 mg at 07/13/21 1654   Or   fluPHENAZine (PROLIXIN) injection 5 mg  5 mg Intramuscular Q2000 Doda, Vandana, MD       haloperidol (HALDOL) tablet 5 mg  5 mg Oral Q8H PRN Nelda Marseille, Amy E, MD       Or   haloperidol lactate (HALDOL) injection 5 mg  5 mg Intramuscular  Q8H PRN Harlow Asa, MD       LORazepam (ATIVAN) tablet 0.25 mg  0.25 mg Oral BID Armando Reichert, MD   0.25 mg at 07/14/21 5366   Or   LORazepam (ATIVAN) injection 0.25 mg  0.25 mg Intramuscular BID Armando Reichert, MD       LORazepam (ATIVAN) tablet 1 mg  1 mg Oral Q8H PRN Harlow Asa, MD       Or   LORazepam (ATIVAN) injection 1 mg  1 mg Intramuscular Q8H PRN Harlow Asa, MD        Lab Results: No results found for this or any previous visit (from the past 48 hour(s)).  Blood Alcohol level:  Lab Results  Component Value Date   ETH <10 06/25/2021   ETH <10 44/09/4740    Metabolic Disorder Labs: Lab Results  Component Value Date   HGBA1C 5.1 06/28/2021   MPG 99.67 06/28/2021   MPG 119.76 02/03/2018   No results found for: PROLACTIN Lab Results  Component Value Date   CHOL 212 (H) 06/28/2021   TRIG 56 06/28/2021   HDL 63 06/28/2021   CHOLHDL 3.4 06/28/2021   VLDL 11 06/28/2021   LDLCALC 138 (H) 06/28/2021   LDLCALC 91 02/03/2018    Physical Findings: AIMS: Facial and Oral Movements Muscles of Facial Expression: None, normal Lips and Perioral Area: None, normal Jaw: None, normal Tongue: None, normal,Extremity Movements Upper (arms, wrists, hands, fingers): None, normal Lower (legs, knees, ankles, toes): None, normal, Trunk Movements Neck, shoulders, hips: None, normal, Overall Severity Severity of abnormal movements (highest score from questions above): None, normal Incapacitation due to abnormal movements: None, normal Patient's awareness of abnormal movements (rate only patient's report): No Awareness, Dental  Status Current problems with teeth and/or dentures?: No Does patient usually wear dentures?: No  CIWA:    COWS:     Musculoskeletal: Strength & Muscle Tone: within normal limits Gait & Station: normal Patient leans: N/A  Psychiatric Specialty Exam:  Presentation  General Appearance: Casual (resting in bed, was seeen up earlier)  Eye Contact:Fair  Speech:Clear and Coherent  Speech Volume:Normal  Handedness:Right   Mood and Affect  Mood:Irritable  Affect:Congruent   Thought Process  Thought Processes:Goal Directed  Descriptions of Associations:Circumstantial  Orientation:Full (Time, Place and Person)  Thought Content:Illogical; Delusions  History of Schizophrenia/Schizoaffective disorder:Yes  Duration of Psychotic Symptoms:Greater than six months  Hallucinations:Hallucinations: -- (not endorsing today, refuses to answer these questions)  Ideas of Reference:Delusions  Suicidal Thoughts:Suicidal Thoughts: -- (not endorsing, but wont officially answer, was negative with RN)  Homicidal Thoughts:Homicidal Thoughts: -- (not endorsing, but denied to RN wont answer with MD)   Sensorium  Memory:Immediate Fair; Recent Poor; Remote Poor  Judgment:Impaired  Insight:None   Executive Functions  Concentration:Fair  Attention Span:Fair  Recall:Poor  Fund of Knowledge:Poor  Language:Fair   Psychomotor Activity  Psychomotor Activity:Psychomotor Activity: Normal   Assets  Assets:Resilience   Sleep  Sleep:Sleep: Fair    Physical Exam: Physical Exam Constitutional:      Appearance: Normal appearance.  HENT:     Head: Normocephalic and atraumatic.  Pulmonary:     Effort: Pulmonary effort is normal.  Neurological:     Mental Status: She is alert and oriented to person, place, and time.   Review of Systems  Genitourinary:  Positive for frequency.  Psychiatric/Behavioral:  The patient does not have insomnia.   Blood pressure (!) 148/102, pulse  78, temperature 97.7 F (36.5 C), temperature source Oral, resp.  rate 18, height _0  (1.549 m), weight 56 kg, SpO2 99 %. Body mass index is 23.32 kg/m.   Treatment Plan Summary: Daily contact with patient to assess and evaluate symptoms and progress in treatment and Medication management The patient is a 51 year old female with a reported history of bipolar disorder and unspecified psychosis, and a medical history of melanoma with metastases to the brain, admitted under IVC for bizarre behaviors, agitation, delusions, and hyperreligiosity. Patient is less isolative and more appropriate in her interactions that are not with her providers. Patient is remaining compliant and will have her meeting with ACT today. Patient continues to be delirious and resistant to the people in her life with some authority (Guardian and MD), but is at least compliant with her medications.   Safety and Monitoring: INVOLUTARILY (by [employer]) admission to inpatient psychiatric unit for safety, stabilization and treatment Daily contact with patient to assess and evaluate symptoms and progress in treatment Patient's case to be discussed in multi-disciplinary team meeting Observation Level : q15 minute checks Vital signs: q12 hours Precautions: suicide, elopement, and assault   2. Psychiatric Problems #Unspecified schizophrenia spectrum and other psychotic d/o (r/o psychosis secondary to general medical condition - metatstatic melanoma vs bipolar d/o MRE manic with psychotic features) -- Continue prolixin 65m PO daily and 12.5 mg at 6 PM or 521mIM q12h with forced medication over objection protocol in place.- will monitor for return of tremor with dose increase -- Continue Ativan 0.25 mg twice daily for report of restlessness on Prolixin  - Continue as needed agitation protocol of Haldol 5 mg every 8 hours p.o. or IM, with/ or Ativan 1 mg every 8 hours p.o. or IM and Benadryl 5043mo or IM every 6 hours PRN agitation -  Continue Cogentin 0.5 mg twice daily for EPS (Scheduled Colace 100m84m PRN for constipation) -- Repeat EKG on 1/5 shows QTC 414ms28mACTT meeting with patient- Tuesday   3. Medical Management   #History of metastatic melanoma Metastases to the brain and adrenals first reported in 2019, and patient had full brain irradiation around that time.  Patient was with regular follow-up until April of this year, and was lost to follow-up for her July appointment and beyond. - Reached out to oncology: Per Dr. GudenLindi Adieify with guardian whether she wants to go through treatment. If she wants to go through treatment, she will need a Whole body scan and to follow up in clinic outpatient; if not, consider hospice.  -- Obtained CT CAP with contrast and CT head with and without contrast  for surveillance (results showed "bilateral adrenal nodules that are decreased in size when compared with most recent prior exam, soft tissue lesion of the gallbladder increased in size when compared with prior exams, unchanged indeterminate hyperenhancing lesions of the caudate lobe of the liver and spleen, endometrial thickening which is increased compared with prior exam recommend further evaluation with pelvic ultrasound, and head CT shows no definite enhancing lesion or other acute intracranial process.") -- Guardian and patient made it clear patient would like treatment             -- Oncology Appt made: 08/04/2021 at 10:30 am W/ Dr. ShadaLorenza CambridgeRN- Will ask Dr. ShadaAlen Blewchedule and his recommendations             -- [facility] App[facility] [place] [facility] [place] [facility] at 10 am  Guardian has been made aware   #Thrombocytopenia, stable #Leukopenia, resolved Thrombocytopenia  Appears chronic - platelets 68 on 1/4 WBC 4.4 with ANC 2500 on 1/4 - Outpatient f/u with PCP/oncology after discharge   Hypokalemia - resolved - repeat K+ 3.9 on 07/07/21    Questionable urinary frequency - UA on 07/09/21 WNL except for straw color   4.  Discharge Planning:              -- Social work and case management to assist with discharge planning and identification of hospital follow-up needs prior to discharge.              -- Discharge Concerns: Need to establish a safety plan; Medication compliance and effectiveness     PGY-2 Freida Busman, MD 07/14/2021, 12:39 PM

## 2021-07-14 NOTE — Progress Notes (Signed)
°   07/14/21 0530  Sleep  Number of Hours 10

## 2021-07-14 NOTE — Progress Notes (Signed)
Adult Psychoeducational Group Note  Date:  07/14/2021 Time:  8:40 PM  Group Topic/Focus:  Wrap-Up Group:   The focus of this group is to help patients review their daily goal of treatment and discuss progress on daily workbooks.  Participation Level:  Did Not Attend  Participation Quality:   Did Not Attend  Affect:   Did Not Attend  Cognitive:   Did Not Attend  Insight: None  Engagement in Group:   Did not Attend  Modes of Intervention:   Did Not Attend  Additional Comments:  Pt did not attend evening wrap up group tonight.  [name] 07/14/2021, 8:40 PM

## 2021-07-14 NOTE — Group Note (Signed)
Recreation Therapy Group Note   Group Topic:Self-Esteem  Group Date: 07/14/2021 Start Time: 1000 End Time: 1055 Facilitators: Victorino Sparrow, LRT,CTRS Location: [address] Dayroom   Goal Area(s) Addresses:  Patient will be able to identify benefits of positive self esteem.  Patient will successfully share why positive self esteem is important. Patient will be able to express how positive self esteem will benefit them post d/c.  Group Description:  How I See Me.  Patients and LRT discussed the importance of how they see themselves and their positive qualities. Patients were then given a picture of a blank face, and told to illustrate and describe how they see themselves and their positive traits.  Patients were given markers to complete the assignment. LRT played music for the group as they completed their assignment. Patients shared their completed assignment with each other.    Affect/Mood: Appropriate   Participation Level: Engaged   Participation Quality: Independent   Behavior: Appropriate and Interactive    Speech/Thought Process: Focused   Insight: Good   Judgement: Good   Modes of Intervention: Art and Music   Patient Response to Interventions:  Engaged   Education Outcome:  Acknowledges education and In group clarification offered    Clinical Observations/Individualized Feedback: Pt was bright and engaged during group.  Pt descried herself as having the "mind of Jesus", she is who [name] thinks and says she is, she is a child of [name]/his darling daughter and she listens to and obeys the almighty [name] and what he says to her.  Pt also made some requests of songs she wanted to hear.  Pt was on task and active throughout group.    Plan: Continue to engage patient in RT group sessions 2-3x/week.   Victorino Sparrow, LRT,CTRS 07/14/2021 12:18 PM

## 2021-07-15 MED ORDER — KETOCONAZOLE 2 % EX CREA
TOPICAL_CREAM | Freq: Two times a day (BID) | CUTANEOUS | Status: DC | PRN
  Filled 2021-07-15: qty 15

## 2021-07-15 MED ORDER — FLUPHENAZINE HCL 2.5 MG PO TABS: 12.5000 mg | ORAL_TABLET | Freq: Every day | ORAL | 0 refills | Status: DC

## 2021-07-15 MED ORDER — BENZTROPINE MESYLATE 0.5 MG PO TABS: 0.5000 mg | ORAL_TABLET | Freq: Two times a day (BID) | ORAL | 0 refills | Status: DC

## 2021-07-15 MED ORDER — FLUPHENAZINE HCL 10 MG PO TABS: 10.0000 mg | ORAL_TABLET | Freq: Every day | ORAL | 0 refills | Status: DC

## 2021-07-15 NOTE — Discharge Summary (Signed)
Physician Discharge Summary Note  Patient:  Kaitlyn Duncan is an 51 y.o., female MRN:  [id number] DOB:  [birth year] Patient phone:  [phone] (home)  Patient address:   [place] [address],  Total Time spent with patient: 15 minutes  Date of Admission:  06/26/2021 Date of Discharge: 07/15/2021  Reason for Admission:  Bizarre behaviors, including barricading herself in the bathroom at her extended stay hotel and hyperreligiosity.  Principal Problem: Schizophrenia spectrum disorder with psychotic disorder type not yet determined [facility]) Discharge Diagnoses: Principal Problem:   Schizophrenia spectrum disorder with psychotic disorder type not yet determined ([place]) Active Problems:   Brain metastasis ([place])   Melanoma of skin ([place])   Leukopenia   Thrombocytopenia ([place])   Past Psychiatric History:  Previous Psych Diagnoses: Bipolar disorder, unspecified psychosis Prior inpatient treatment:Patient not cooperative to offer information Current/prior outpatient treatment: unable to assess Prior rehab OZ:DGUYQIH not cooperative to offer information Psychotherapy hx: Patient not cooperative to offer information History of suicide:Patient not cooperative to offer information History of homicide: Patient not cooperative to offer information Psychiatric medication history: Per records has been on Geodon IM, Haldol, Seroquel, Tegretol, Cogentin, Zyprexa, Ativan Psychiatric medication compliance history:Patient not cooperative to offer information Neuromodulation history: Patient not cooperative to offer information Current Psychiatrist:Patient not cooperative to offer information Current therapist: Patient not cooperative to offer information  Past Medical History:  Past Medical History:  Diagnosis Date   Allergy    Anemia    Anemia    Diabetes mellitus without complication ([place])    prediabetic   GERD (gastroesophageal reflux disease)    melanoma  with met dz dx'd 01/2018   brain and adrenal   Seasonal allergies     Past Surgical History:  Procedure Laterality Date   BIOPSY  01/28/2018   Procedure: BIOPSY;  Surgeon: Laurence Spates, MD;  Location: [facility] ENDOSCOPY;  Service: Endoscopy;;   BREAST BIOPSY Left    ESOPHAGOGASTRODUODENOSCOPY N/A 01/28/2018   Procedure: ESOPHAGOGASTRODUODENOSCOPY (EGD);  Surgeon: Laurence Spates, MD;  Location: [facility];  Service: Endoscopy;  Laterality: N/A;   MOUTH SURGERY     OVARY SURGERY     removed "something"   Family History:  Family History  Problem Relation Age of Onset   Melanoma Mother    Hypertension Father    Breast cancer Maternal Aunt    Breast cancer Paternal Aunt    Diabetes Paternal Aunt    Breast cancer Maternal Grandmother    Breast cancer Paternal Grandmother    Diabetes Paternal Grandmother    Colon cancer Neg Hx    Esophageal cancer Neg Hx    Pancreatic cancer Neg Hx    Stomach cancer Neg Hx    Liver disease Neg Hx    Rectal cancer Neg Hx    Family Psychiatric  History: NA Social History:  Social History   Substance and Sexual Activity  Alcohol Use No     Social History   Substance and Sexual Activity  Drug Use No    Social History   Socioeconomic History   Marital status: Single    Spouse name: Not on file   Number of children: Not on file   Years of education: Not on file   Highest education level: Not on file  Occupational History   Not on file  Tobacco Use   Smoking status: Never   Smokeless tobacco: Never  Vaping Use   Vaping Use: Never used  Substance and Sexual Activity   Alcohol  use: No   Drug use: No   Sexual activity: Not Currently  Other Topics Concern   Not on file  Social History Narrative   Not on file   Social Determinants of Health   Financial Resource Strain: Not on file  Food Insecurity: Not on file  Transportation Needs: Not on file  Physical Activity: Not on file  Stress: Not on file  Social Connections: Not on file     Hospital Course:   [name] is a 50 year old female with a reported psychiatric history of bipolar disorder and unspecified psychosis, and a medical history of melanoma of skin with metastases to the brain and adrenals, admitted under IVC from [facility] for bizarre behaviors, including barricading herself in the bathroom at her extended stay hotel and hyperreligiosity. Patient was started on Haldol and Ativan  however, patient refused to take medication and had to be placed under a forced medication order and receive the medication IM. Patient was noted to attempt to call [facility] via the unit phones, and was seen dialing random numbers trying to reach [facility]. Patient was noted to be very irritable and isolative. Patient would avoid Providers by staying in her bathroom hours at a time and each time a provider came, she would abruptly stop answering questions, demand privacy and go to her bathroom. After a few days on upward titration of Haldol, patient was able to communicate that she did not like the medication because it caused her to have strange dreams. Patient was transitioned to Prolixin and titrated down from Haldol. Patient slept very well on Prolixin with Ativan.  Patient became compliant on Prolixin and did not require PRN medication. Patient remained very irritable and guarded with providers, but slowly participated more on assessments. Patient continued to endorse her hyperreligious delusions but her AVH slowly improved. It was difficult to asses patient's AVH as patient would refuse to answer directly with a "yes" or "no." Patient did become occupied with re learning she had a legal guardian. Patient attempted to get rid of her legal guardian, and became irate when she was mentioned. As Prolixin was titrated upward patient behavior was noted to improve. Patient became less obsessed with being transferred to [facility] and became more appropriate with her request to have F/U for physical ailments. Patient endorsed that she  was having urinary frequency worse at night, but wished to spend less time in the bathroom. Patient was negative for UTI, but had been noted to have masses in her abdomen that will be followed OP by her oncologist. Patient also attended a few groups and began to spend some time out on the unit. Patient was able to ambulate to and from her meals with decrease fall concerns.  Patient was more interactive on assessments with providers. There was an attempt to stop Ativan however, during downward titration patient was noted to have some RLE tremor, that patient could consciously stop. Ultimately patient was not noted to have tremor with 0.$RemoveBefor'25mg'ytRJMCQmzSap$  BID of Ativan. There was also an attempt to lower patient's QHS Prolixin for easier prescribing and compliance; however patient decompensated and became irritable and withdrawn. Patient improved again with increase back to $Remov'10mg'oVggrk$  daily and 12.$RemoveBefo'5mg'YBJShBoWxiW$  QHS. At discharge patient was ina good mood, had not required PRN medications for agitation in at least 48 hrs, and was denying SI, HI, and AVH. Patient was able to name her medication and recall her OP f/u appt with GI and Oncology.   Due to issues with compliance and patient's decline in  health, an ACT referral was made. Patient was ultimately accepted by the ACT team. It is recommended that the topic of LAI be approached with patient and guardian as patient is responsible for large pill burden with her QHS Prolixin.     Patient had a legal guardian who was consulted for all medication adjustments and involved in the discharge planning for patient.   Physical Findings: AIMS: Facial and Oral Movements Muscles of Facial Expression: None, normal Lips and Perioral Area: None, normal Jaw: None, normal Tongue: None, normal,Extremity Movements Upper (arms, wrists, hands, fingers): None, normal Lower (legs, knees, ankles, toes): None, normal, Trunk Movements Neck, shoulders, hips: None, normal, Overall Severity Severity of  abnormal movements (highest score from questions above): None, normal Incapacitation due to abnormal movements: None, normal Patient's awareness of abnormal movements (rate only patient's report): No Awareness, Dental Status Current problems with teeth and/or dentures?: No Does patient usually wear dentures?: No  CIWA:    COWS:     Musculoskeletal: Strength & Muscle Tone: within normal limits Gait & Station: normal Patient leans: N/A   Psychiatric Specialty Exam:  Presentation  General Appearance: Casual (resting in bed, was seeen up earlier)  Eye Contact:Fair  Speech:Clear and Coherent  Speech Volume:Normal  Handedness:Right   Mood and Affect  Mood:Irritable  Affect:Congruent   Thought Process  Thought Processes:Goal Directed  Descriptions of Associations:Circumstantial  Orientation:Full (Time, Place and Person)  Thought Content:Illogical; Delusions  History of Schizophrenia/Schizoaffective disorder:Yes  Duration of Psychotic Symptoms:Greater than six months  Hallucinations:Hallucinations: -- (not endorsing today, refuses to answer these questions)  Ideas of Reference:Delusions  Suicidal Thoughts:Suicidal Thoughts: -- (not endorsing, but wont officially answer, was negative with RN)  Homicidal Thoughts:Homicidal Thoughts: -- (not endorsing, but denied to RN wont answer with MD)   Sensorium  Memory:Immediate Fair; Recent Poor; Remote Poor  Judgment:Impaired  Insight:None   Executive Functions  Concentration:Fair  Attention Span:Fair  Recall:Poor  Fund of Knowledge:Poor  Language:Fair   Psychomotor Activity  Psychomotor Activity:Psychomotor Activity: Normal   Assets  Assets:Resilience   Sleep  Sleep:Sleep: Fair    Physical Exam: Physical Exam HENT:     Head: Normocephalic and atraumatic.  Pulmonary:     Effort: Pulmonary effort is normal.  Neurological:     Mental Status: She is alert and oriented to person, place, and  time.   Review of Systems  Psychiatric/Behavioral:  Negative for depression, hallucinations and suicidal ideas. The patient does not have insomnia.   Blood pressure (!) 131/91, pulse 79, temperature 98.1 F (36.7 C), temperature source Oral, resp. rate 18, height $RemoveBe'5\' 1"'qKYuZpqgg$  (1.549 m), weight 56 kg, SpO2 99 %. Body mass index is 23.32 kg/m.   Social History   Tobacco Use  Smoking Status Never  Smokeless Tobacco Never   Tobacco Cessation:  N/A, patient does not currently use tobacco products   Blood Alcohol level:  Lab Results  Component Value Date   ETH <10 06/25/2021   ETH <10 81/04/3158    Metabolic Disorder Labs:  Lab Results  Component Value Date   HGBA1C 5.1 06/28/2021   MPG 99.67 06/28/2021   MPG 119.76 02/03/2018   No results found for: PROLACTIN Lab Results  Component Value Date   CHOL 212 (H) 06/28/2021   TRIG 56 06/28/2021   HDL 63 06/28/2021   CHOLHDL 3.4 06/28/2021   VLDL 11 06/28/2021   LDLCALC 138 (H) 06/28/2021   [place] 91 02/03/2018    See Psychiatric Specialty Exam and Suicide Risk Assessment completed by  Attending Physician prior to discharge.  Discharge destination:  Other:  Hotel per legal guardian  Is patient on multiple antipsychotic therapies at discharge:  No   Has Patient had three or more failed trials of antipsychotic monotherapy by history:  No  Recommended Plan for Multiple Antipsychotic Therapies: NA   Allergies as of 07/15/2021   No Known Allergies      Medication List     TAKE these medications      Indication  benztropine 0.5 MG tablet Commonly known as: COGENTIN Take 1 tablet (0.5 mg total) by mouth 2 (two) times daily.  Indication: Extrapyramidal Reaction caused by Medications   clotrimazole 1 % cream Commonly known as: LOTRIMIN Apply to affected area 2 times daily  Indication: Skin Infection due to Candida Yeast   fluPHENAZine 2.5 MG tablet Commonly known as: PROLIXIN Take 5 tablets (12.5 mg total) by mouth  daily at 8 pm.  Indication: Schizophrenia   fluPHENAZine 10 MG tablet Commonly known as: PROLIXIN Take 1 tablet (10 mg total) by mouth daily. Start taking on: July 16, 2021  Indication: Schizophrenia   fluticasone 50 MCG/ACT nasal spray Commonly known as: FLONASE Place 2 sprays into both nostrils daily.  Indication: Allergic Rhinitis   hydrocortisone cream 1 % Apply to affected area 2 times daily  Indication: Skin Disease Successfully Treated with Steroid Therapy        Follow-up Information     [facility] Follow up.   Why: ACTT intake has been completed with you and they will begin services with you at discharge from the hospital. Contact information: [address]  [place] [address] [phone]                 Follow-up recommendations:  Follow up recommendations: - Activity as tolerated. - Diet as recommended by PCP. - Keep all scheduled follow-up appointments as recommended.   Comments:  Patient is instructed to take all prescribed medications as recommended. Report any side effects or adverse reactions to your outpatient psychiatrist. Patient is instructed to abstain from alcohol and illegal drugs while on prescription medications. In the event of worsening symptoms, patient is instructed to call the crisis hotline, 911, or go to the nearest emergency department for evaluation and treatment.    Signed:  PGY-2 Freida Busman, MD 07/15/2021, 2:27 PM

## 2021-07-15 NOTE — Group Note (Signed)
Type of Therapy/Topic: Identifying Irrational Beliefs/Thoughts  Participation Level: None  Description of Group: The purpose of this group is to assist patients in learning to identify irrational beliefs and thoughts that contribute to their negative emotions and experience positive emotions. Patients will be guided to discuss ways in which they have been effected by irrational thoughts and beliefs and how to transform those irrational beliefs into rational ones. Newly identified rational beliefs will be juxtaposed with experiences of positive emotions or situations, and patients will be challenged to use rational beliefs or thoughts to combat negative ones. Special emphasis will be placed on coping with irrational beliefs in conflict situations, and patients will process healthy conflict resolution skills.  Therapeutic Goals: 1. Patient will identify two irrational thoughts or beliefs  to reflect on in order to balance out those thoughts 2. Patient will label two or more irrational thoughts/beliefs that they find the most difficult to cope with 3. Patient will demonstrate positive conflict resolution skills through discussion and/or role plays that will assist in transforming irrational thoughts or beliefs into positive ones.  Summary of Patient Progress: Pt did not participate in group partially due to other patients monopolizing group time.    Therapeutic Modalities: Cognitive Behavioral Therapy Feelings Identification Dialectical Behavioral Therapy

## 2021-07-15 NOTE — BHH Suicide Risk Assessment (Signed)
Suicide Risk Assessment  Discharge Assessment    [facility] Discharge Suicide Risk Assessment   Principal Problem: Schizophrenia spectrum disorder with psychotic disorder type not yet determined [facility]) Discharge Diagnoses: Principal Problem:   Schizophrenia spectrum disorder with psychotic disorder type not yet determined ([place]) Active Problems:   Brain metastasis ([place])   Melanoma of skin ([place])   Leukopenia   Thrombocytopenia ([facility]) [name] is a 51 year old female with a reported psychiatric history of bipolar disorder and unspecified psychosis, and a medical history of melanoma of skin with metastases to the brain and adrenals, admitted under IVC from [facility] for bizarre behaviors, including barricading herself in the bathroom at her extended stay hotel and hyperreligiosity. Patient IVC was continued. Patient was initially treated with haldol; however patient endorsed having nightmares on this medication and required forced med order to be compliant. Patient was transitioned to Prolixin, to which she responded well. Patient began to endorse better sleep, less irritability, less paranoia towards staff, and less AVH. Patient was compliant with her Prolixin and began participating in group therapy. Patient consistently denied SI, HI, and AVH and did so on day of discharge. Patient endorsed intention to remain compliant and to f/u with all of her outpatient providers and medication. At discharge patient was very pleasant and goal oriented about her health care. Patient was accepted by ACTT during her hospitalization and will be followed by ACTT outpatient.    All medication adjustments were done with the consent from patient's legal guardian.   Total Time spent with patient: 20 minutes  Musculoskeletal: Strength & Muscle Tone: within normal limits Gait & Station: normal Patient leans: N/A  Psychiatric Specialty Exam  Presentation  General Appearance: Casual (resting in bed, was seeen up earlier)  Eye  Contact:Fair  Speech:Clear and Coherent  Speech Volume:Normal  Handedness:Right   Mood and Affect  Mood:Irritable  Duration of Depression Symptoms: Greater than two weeks  Affect:Congruent   Thought Process  Thought Processes:Goal Directed  Descriptions of Associations:Circumstantial  Orientation:Full (Time, Place and Person)  Thought Content:Illogical; Delusions  History of Schizophrenia/Schizoaffective disorder:Yes  Duration of Psychotic Symptoms:Greater than six months  Hallucinations:Hallucinations: -- (not endorsing today, refuses to answer these questions)  Ideas of Reference:Delusions  Suicidal Thoughts:Suicidal Thoughts: -- (not endorsing, but wont officially answer, was negative with RN)  Homicidal Thoughts:Homicidal Thoughts: -- (not endorsing, but denied to RN wont answer with MD)   Sensorium  Memory:Immediate Fair; Recent Poor; Remote Poor  Judgment:Impaired  Insight:None   Executive Functions  Concentration:Fair  Attention Span:Fair  Recall:Poor  Fund of Knowledge:Poor  Language:Fair   Psychomotor Activity  Psychomotor Activity:Psychomotor Activity: Normal   Assets  Assets:Resilience   Sleep  Sleep:Sleep: Fair   Physical Exam: Physical Exam HENT:     Head: Normocephalic and atraumatic.  Pulmonary:     Effort: Pulmonary effort is normal.  Neurological:     Mental Status: She is alert and oriented to person, place, and time.   Review of Systems  Genitourinary:  Positive for frequency.  Psychiatric/Behavioral:  Negative for depression and suicidal ideas. The patient does not have insomnia.   Blood pressure (!) 131/91, pulse 79, temperature 98.1 F (36.7 C), temperature source Oral, resp. rate 18, height 5\' 1"  (1.549 m), weight 56 kg, SpO2 99 %. Body mass index is 23.32 kg/m.  Mental Status Per Nursing Assessment::   On Admission:  NA  Demographic Factors:  Divorced or widowed, Caucasian, and Living alone  Loss  Factors: Decline in physical health  Historical Factors: NA  Risk Reduction Factors:   Religious beliefs about death  Continued Clinical Symptoms:  Unstable or Poor Therapeutic Relationship  Cognitive Features That Contribute To Risk:  Polarized thinking    Suicide Risk:  Minimal: No identifiable suicidal ideation.  Patients presenting with no risk factors but with morbid ruminations; may be classified as minimal risk based on the severity of the depressive symptoms   Follow-up Information     [facility] Follow up.   Why: ACTT intake has been completed with you and they will begin services with you at discharge from the hospital. Contact information: [address] [place] [address] [phone]                 Plan Of Care/Follow-up recommendations:  Patient is instructed to take all prescribed medications as recommended. Report any side effects or adverse reactions to your outpatient psychiatrist. Patient is instructed to abstain from alcohol and illegal drugs while on prescription medications. In the event of worsening symptoms, patient is instructed to call the crisis hotline, 911, or go to the nearest emergency department for evaluation and treatment.     PGY-2 Freida Busman, MD 07/15/2021, 2:19 PM

## 2021-07-15 NOTE — BHH Group Notes (Signed)
Adult Psychoeducational Group Note  Date:  07/15/2021 Time:  9:08 AM  Group Topic/Focus:  Goals Group:   The focus of this group is to help patients establish daily goals to achieve during treatment and discuss how the patient can incorporate goal setting into their daily lives to aide in recovery.  Participation Level:  Active  Participation Quality:  Appropriate  Affect:  Appropriate  Cognitive:  Appropriate  Insight: Appropriate  Engagement in Group:  Engaged  Modes of Intervention:  Discussion    Dub Mikes 07/15/2021, 9:08 AM

## 2021-07-15 NOTE — Progress Notes (Signed)
°  [facility] Adult Case Management Discharge Plan :  Will you be returning to the same living situation after discharge:  No. To be discharged to hotel At discharge, do you have transportation home?: Yes,  guardian to pick this patient up Do you have the ability to pay for your medications: Yes,  has insurance  Release of information consent forms completed and in the chart;  Patient's signature needed at discharge.  Patient to Follow up at:  Follow-up Information     [facility] Follow up.   Why: ACTT intake has been completed with you and they will begin services with you at discharge from the hospital. Contact information: [address] [place] [address] [phone]                 Next level of care provider has access to [place] and Suicide Prevention discussed: Yes,  with guardian     Has patient been referred to the Quitline?: N/A patient is not a smoker  Patient has been referred for addiction treatment: [place], LCSW 07/15/2021, 9:56 AM

## 2021-07-15 NOTE — Progress Notes (Signed)
Pt discharged to lobby. Pt was stable and appreciative at that time. All papers and prescriptions were given and valuables returned. Verbal understanding expressed. Denies SI/HI and A/VH. Pt given opportunity to express concerns and ask questions.  

## 2021-07-15 NOTE — Group Note (Signed)
Recreation Therapy Group Note   Group Topic:Personal Development  Group Date: 07/15/2021 Start Time: 1000 End Time: 5883 Facilitators: Victorino Sparrow, LRT,CTRS Location: [address] Dayroom   Goal Area(s) Addresses:  Patient will successfully identify characteristics that define you. Patient will successfully identify positive actions and behaviors they can use post d/c.   Group Description:  Totika.  LRT introduced the game Totika to patients.  The game is played like [place].  The difference is all the blocks consist of the colors blue, orange, red and green.  Patient will pull a block and place it on top of the stack.  Which ever color the patient picks, LRT will ask them a question that corresponds with the color from deck of card.  Once patient answers the question, the next person will go.   Affect/Mood: N/A   Participation Level: Did not attend    Clinical Observations/Individualized Feedback:     Plan: Continue to engage patient in RT group sessions 2-3x/week.   Victorino Sparrow, LRT,CTRS 07/15/2021 1:44 PM

## 2021-07-15 NOTE — Progress Notes (Signed)
°   07/15/21 0515  Sleep  Number of Hours 8

## 2021-07-21 ENCOUNTER — Encounter: Payer: Self-pay | Admitting: Oncology

## 2021-07-24 ENCOUNTER — Encounter: Payer: Self-pay | Admitting: *Deleted

## 2021-07-24 ENCOUNTER — Ambulatory Visit (INDEPENDENT_AMBULATORY_CARE_PROVIDER_SITE_OTHER): Payer: Medicare Other | Admitting: Physician Assistant

## 2021-07-24 VITALS — BP 122/78 | HR 76 | Ht 61.0 in | Wt 126.0 lb

## 2021-07-24 DIAGNOSIS — K30 Functional dyspepsia: Secondary | ICD-10-CM

## 2021-07-24 DIAGNOSIS — R142 Eructation: Secondary | ICD-10-CM

## 2021-07-24 DIAGNOSIS — K5900 Constipation, unspecified: Secondary | ICD-10-CM

## 2021-07-24 DIAGNOSIS — R1013 Epigastric pain: Secondary | ICD-10-CM | POA: Diagnosis not present

## 2021-07-24 NOTE — Progress Notes (Signed)
Chief Complaint: Indigestion and burping, epigastric discomfort and constipation  HPI:    Kaitlyn Duncan is a 51 year old female with a past medical history of diabetes, reflux and melanoma with mets to the brain and adrenals, known to Dr. Tarri Glenn, who presents clinic today with a complaint of indigestion and burping, epigastric discomfort and constipation.    09/04/2020 patient seen in clinic by me for rectal bleeding and indigestion.  At that time discussed that she was not on her Omeprazole.  She had daily erections and indigestion.  Also describes some blood in her stool.  On rectal exam had grade 1 internal hemorrhoids.  She was scheduled for an EGD given ongoing acid indigestion and history of melanoma with Dr. Tarri Glenn.  (She never has a son).  We discussed Carafate.  Reviewed recent colonoscopy 03/19/2020 and prescribed Hydrocortisone suppositories for rectal bleeding.    06/26/2021-07/15/2021 patient [facility] for bizarre behaviors including barricading herself in the bathroom at her extended stay hotel and hyperreligiosity.  She was noted to have schizophrenia with psychotic disorder type.  She was started on various medications.    06/30/2021 CT of the chest abdomen pelvis with contrast done for history of metastatic melanoma with bilateral adrenal nodules decreased in size, soft tissue lesion of the gallbladder increased in size when compared with prior exams, unchanged indeterminate hyperenhancing lesions of the caudate lobe of the liver and spleen, endometrial thickening which was increased.    07/08/2021 CBC with platelets decreased at 68.  BMP normal.    Today, the patient tells me that she continues with gas and burping now also flatulence.  Describes this has been worse over the past week or so.  Apparently is eating a lot of microwave meals and not eating very healthfully and is not having much fiber in her diet due to living in a hotel currently.  She thinks this  has something to do with her symptoms.  Previously Gas-X helped as well as Omeprazole.  Describes she had not been to the store to pick this up but does have an over-the-counter decreased price card which she thinks will help with the cost.    Also discusses that she has had a decrease in bowel movements in general, and sometimes these are harder to get out.  Tells me she has had a hard time with fiber supplements in the past and would like to know what else to try.    Patient has follow-up with an oncologist later this month for her melanoma.    Denies fever, chills, weight loss or blood in her stool.  Past Medical History:  Diagnosis Date   Allergy    Anemia    Diabetes mellitus without complication ([place])    prediabetic   Gallstones    GERD (gastroesophageal reflux disease)    Hemorrhoids    melanoma with met dz dx'd 01/2018   brain and adrenal   Seasonal allergies     Past Surgical History:  Procedure Laterality Date   BIOPSY  01/28/2018   Procedure: BIOPSY;  Surgeon: Laurence Spates, MD;  Location: [facility];  Service: Endoscopy;;   BREAST BIOPSY Left    ESOPHAGOGASTRODUODENOSCOPY N/A 01/28/2018   Procedure: ESOPHAGOGASTRODUODENOSCOPY (EGD);  Surgeon: Laurence Spates, MD;  Location: [facility];  Service: Endoscopy;  Laterality: N/A;   MOUTH SURGERY     OVARY SURGERY     removed "something"    Current Outpatient Medications  Medication Sig Dispense Refill   benztropine (COGENTIN) 0.5  MG tablet Take 1 tablet (0.5 mg total) by mouth 2 (two) times daily. 30 tablet 0   clotrimazole (LOTRIMIN) 1 % cream Apply to affected area 2 times daily (Patient not taking: Reported on 06/25/2021) 15 g 0   fluPHENAZine (PROLIXIN) 10 MG tablet Take 1 tablet (10 mg total) by mouth daily. 30 tablet 0   fluPHENAZine (PROLIXIN) 2.5 MG tablet Take 5 tablets (12.5 mg total) by mouth daily at 8 pm. 150 tablet 0   fluticasone (FLONASE) 50 MCG/ACT nasal spray Place 2 sprays into both nostrils daily.  (Patient not taking: Reported on 06/25/2021) 16 g 2   hydrocortisone cream 1 % Apply to affected area 2 times daily (Patient not taking: Reported on 06/25/2021) 15 g 0   No current facility-administered medications for this visit.    Allergies as of 07/24/2021   (No Known Allergies)    Family History  Problem Relation Age of Onset   Melanoma Mother    Hypertension Father    Breast cancer Maternal Aunt    Breast cancer Paternal Aunt    Diabetes Paternal Aunt    Breast cancer Maternal Grandmother    Breast cancer Paternal Grandmother    Diabetes Paternal Grandmother    Colon cancer Neg Hx    Esophageal cancer Neg Hx    Pancreatic cancer Neg Hx    Stomach cancer Neg Hx    Liver disease Neg Hx    Rectal cancer Neg Hx     Social History   Socioeconomic History   Marital status: Single    Spouse name: Not on file   Number of children: Not on file   Years of education: Not on file   Highest education level: Not on file  Occupational History   Not on file  Tobacco Use   Smoking status: Never   Smokeless tobacco: Never  Vaping Use   Vaping Use: Never used  Substance and Sexual Activity   Alcohol use: No   Drug use: No   Sexual activity: Not Currently  Other Topics Concern   Not on file  Social History Narrative   Not on file   Social Determinants of Health   Financial Resource Strain: Not on file  Food Insecurity: Not on file  Transportation Needs: Not on file  Physical Activity: Not on file  Stress: Not on file  Social Connections: Not on file  Intimate Partner Violence: Not on file    Review of Systems:    Constitutional: No weight loss, fever or chills Cardiovascular: No chest pain Respiratory: No SOB Gastrointestinal: See HPI and otherwise negative   Physical Exam:  Vital signs: BP 122/78    Pulse 76    Ht 5\' 1"  (1.549 m)    Wt 126 lb (57.2 kg)    SpO2 97%    BMI 23.81 kg/m    Constitutional:   Pleasant Caucasian female appears to be in NAD, Well  developed, Well nourished, alert and cooperative Respiratory: Respirations even and unlabored. Lungs clear to auscultation bilaterally.   No wheezes, crackles, or rhonchi.  Cardiovascular: Normal S1, S2. No MRG. Regular rate and rhythm. No peripheral edema, cyanosis or pallor.  Gastrointestinal:  Soft, nondistended, mild epigastric ttp, No rebound or guarding. Normal bowel sounds. No appreciable masses or hepatomegaly. Rectal:  Not performed.  Psychiatric:  Demonstrates good judgement and reason without abnormal affect or behaviors.  RELEVANT LABS AND IMAGING: CBC    Component Value Date/Time   WBC 4.4 07/08/2021 09/05/2021  RBC 4.63 07/08/2021 0639   HGB 13.0 07/08/2021 0639   HGB 13.2 07/16/2020 1111   HCT 40.9 07/08/2021 0639   PLT 68 (L) 07/08/2021 0639   PLT 67 (L) 07/16/2020 1111   MCV 88.3 07/08/2021 0639   MCH 28.1 07/08/2021 0639   MCHC 31.8 07/08/2021 0639   RDW 13.2 07/08/2021 0639   LYMPHSABS 1.4 07/08/2021 0639   MONOABS 0.5 07/08/2021 0639   EOSABS 0.0 07/08/2021 0639   BASOSABS 0.0 07/08/2021 0639    CMP     Component Value Date/Time   NA 138 07/07/2021 0627   K 3.9 07/07/2021 0627   CL 106 07/07/2021 0627   CO2 26 07/07/2021 0627   GLUCOSE 89 07/07/2021 0627   BUN 20 07/07/2021 0627   CREATININE 0.52 07/07/2021 0627   CREATININE 0.75 07/16/2020 1111   CALCIUM 9.4 07/07/2021 0627   PROT 6.5 06/25/2021 1950   ALBUMIN 3.7 06/25/2021 1950   AST 31 06/25/2021 1950   AST 21 07/16/2020 1111   ALT 23 06/25/2021 1950   ALT 9 07/16/2020 1111   ALKPHOS 68 06/25/2021 1950   BILITOT 0.5 06/25/2021 1950   BILITOT 0.4 07/16/2020 1111   GFRNONAA >60 07/07/2021 0627   GFRNONAA >60 07/16/2020 1111   GFRAA >60 01/08/2020 1210    Assessment: 1.  Gas/eructations and flatulence: Continues per patient, currently not taking any medications to help with this, likely also partially diet related, previous history of signs of melanoma in her stomach on EGD in 2019, repeat was  recommended last year but patient never followed through 2.  Epigastric discomfort: Likely with above 3.  Constipation: Likely due to diet currently 4.  Metastatic melanoma: Patient has follow-up appointment with oncology later this month  Plan: 1.  Discussed prescription medicines with the patient but she tells me these are hard for her to get.  Apparently she has an over-the-counter discount card for medications and would like to try this instead. 2.  Recommend the patient start Omeprazole 20 mg daily, 30 to 60 minutes before breakfast.  She should continue on this likely indefinitely. 3.  Also discussed Gas-X as needed before meals.  Apparently this is helped in the past.  Also discussed decreasing carbonated beverages that she seems to be drinking a lot of these recently. 4.  Discussed MiraLAX OTC once daily for constipation. 5.  Recommend the patient have her EGD as we discussed this at her last appointment and she was previously scheduled for one.  There have been signs of melanoma in her stomach, due to ongoing symptoms recommend we recheck this now.  She is scheduled for an EGD in the [place] with Dr. Tarri Glenn.  Did provide the patient a detailed list of risks for the procedure and she agrees to proceed. 6.  Patient to follow in clinic per recommendations from Dr. Tarri Glenn after time of procedure.  Kaitlyn Newer, PA-C [facility] 07/24/2021, 9:58 AM

## 2021-07-24 NOTE — Progress Notes (Signed)
Reviewed and agree with management plans. ? ?Kaitlyn Duncan Fedorko, MD, MPH  ?

## 2021-07-24 NOTE — Patient Instructions (Addendum)
You have been scheduled for an endoscopy. Please follow written instructions given to you at your visit today. If you use inhalers (even only as needed), please bring them with you on the day of your procedure.  Please purchase the following medications over the counter and take as directed: Omeprazole OTC 20 mg daily Gas-X as needed before meals  Miralax 17 grams (1 capful) dissolved in at least 8 ounces water/juice once daily (for constipation)  If you are age 27 or older, your body mass index should be between 23-30. Your Body mass index is 23.81 kg/m. If this is out of the aforementioned range listed, please consider follow up with your Primary Care Provider.  If you are age 39 or younger, your body mass index should be between 19-25. Your Body mass index is 23.81 kg/m. If this is out of the aformentioned range listed, please consider follow up with your Primary Care Provider.   ________________________________________________________  The [facility] providers would like to encourage you to use [facility] to communicate with providers for non-urgent requests or questions.  Due to long hold times on the telephone, sending your provider a message by [facility] may be a faster and more efficient way to get a response.  Please allow 48 business hours for a response.  Please remember that this is for non-urgent requests.  _______________________________________________________ Due to recent changes in healthcare laws, you may see the results of your imaging and laboratory studies on MyChart before your provider has had a chance to review them.  We understand that in some cases there may be results that are confusing or concerning to you. Not all laboratory results come back in the same time frame and the provider may be waiting for multiple results in order to interpret others.  Please give [place] 48 hours in order for your provider to thoroughly review all the results before contacting the office for  clarification of your results.

## 2021-08-04 ENCOUNTER — Inpatient Hospital Stay: Payer: Medicare Other | Attending: Oncology | Admitting: Oncology

## 2021-08-04 ENCOUNTER — Other Ambulatory Visit: Payer: Self-pay

## 2021-08-04 VITALS — BP 120/83 | HR 64 | Temp 97.9°F | Resp 16 | Ht 61.0 in | Wt 131.0 lb

## 2021-08-04 DIAGNOSIS — Z9221 Personal history of antineoplastic chemotherapy: Secondary | ICD-10-CM | POA: Diagnosis not present

## 2021-08-04 DIAGNOSIS — Z923 Personal history of irradiation: Secondary | ICD-10-CM | POA: Diagnosis not present

## 2021-08-04 DIAGNOSIS — C7940 Secondary malignant neoplasm of unspecified part of nervous system: Secondary | ICD-10-CM | POA: Diagnosis not present

## 2021-08-04 DIAGNOSIS — C786 Secondary malignant neoplasm of retroperitoneum and peritoneum: Secondary | ICD-10-CM | POA: Insufficient documentation

## 2021-08-04 DIAGNOSIS — C439 Malignant melanoma of skin, unspecified: Secondary | ICD-10-CM | POA: Insufficient documentation

## 2021-08-04 NOTE — Progress Notes (Signed)
Hematology and Oncology Follow Up Visit  Kaitlyn Duncan [id number] 04/04/1971 51 y.o. 08/04/2021 10:20 AM Pcp, NoNo ref. provider found   Principle Diagnosis: 51 year old woman with stage IV melanoma of unknown primary presented with peritoneal and CNS involvement in July 2019.     Prior Therapy: He is status post subcutaneous nodule biopsy on 01/27/2018 which confirmed the presence of metastatic melanoma. She status post endoscopy on 01/28/2018 which showed subcutaneous involvement that consistent with metastatic melanoma.  Whole brain radiation for total of 30 Gy in 10 fractions started on 02/07/2018.   Mektovi 45 mg bid, [name] 450 mg daily started in August 2019.  Therapy discontinued in November 2019 after partial response.   Pembrolizumab 200 mg every 3 weeks started on 06/05/2018.  She is status post 12 cycles completed in July 2020.    Current therapy: Active surveillance.    Interim History: Kaitlyn Duncan returns today for repeat evaluation.  Since her last visit, she reports no major changes in her health.  She denies any recent illnesses or complaints.  She does report occasional abdominal and lower back pain but overall eating well and gained weight.  She denies any hematochezia, melena or hemoptysis.           .     Medications: Reviewed without changes. No current outpatient medications on file.   No current facility-administered medications for this visit.     Allergies: No Known Allergies  .     Physical Exam:     Blood pressure 120/83, pulse 64, temperature 97.9 F (36.6 C), temperature source Temporal, resp. rate 16, height 5\' 1"  (1.549 m), weight 131 lb (59.4 kg), SpO2 100 %.      ECOG: 1    General appearance: Comfortable appearing without any discomfort Head: Normocephalic without any trauma Oropharynx: Mucous membranes are moist and pink without any thrush or ulcers. Eyes: Pupils are equal and round reactive to  light. Lymph nodes: No cervical, supraclavicular, inguinal or axillary lymphadenopathy.   Heart:regular rate and rhythm.  S1 and S2 without leg edema. Lung: Clear without any rhonchi or wheezes.  No dullness to percussion. Abdomin: Soft, nontender, nondistended with good bowel sounds.  No hepatosplenomegaly. Musculoskeletal: No joint deformity or effusion.  Full range of motion noted. Neurological: No deficits noted on motor, sensory and deep tendon reflex exam. Skin: No petechial rash or dryness.  Appeared moist.                    Lab Results: Lab Results  Component Value Date   WBC 4.4 07/08/2021   HGB 13.0 07/08/2021   HCT 40.9 07/08/2021   MCV 88.3 07/08/2021   PLT 68 (L) 07/08/2021     Chemistry      Component Value Date/Time   NA 138 07/07/2021 0627   K 3.9 07/07/2021 0627   CL 106 07/07/2021 0627   CO2 26 07/07/2021 0627   BUN 20 07/07/2021 0627   CREATININE 0.52 07/07/2021 0627   CREATININE 0.75 07/16/2020 1111      Component Value Date/Time   CALCIUM 9.4 07/07/2021 0627   ALKPHOS 68 06/25/2021 1950   AST 31 06/25/2021 1950   AST 21 07/16/2020 1111   ALT 23 06/25/2021 1950   ALT 9 07/16/2020 1111   BILITOT 0.5 06/25/2021 1950   BILITOT 0.4 07/16/2020 1111      1. Bilateral adrenal nodules are decreased in size when compared with most recent prior exam. 2. Soft tissue lesion of  the gallbladder increased in size when compared with prior exams. 3. Unchanged indeterminate hyperenhancing lesions of the caudate lobe of the liver and spleen. 4. Endometrial thickening, which is increased compared with prior exam. Recommend further evaluation with pelvic ultrasound.    51 year old woman with:  1.  Melanoma of unknown primary diagnosed in July 2019.  She presented with stage IV disease clinic peritoneal and CNS involvement.  The natural course of her disease was reviewed and treatment choices were discussed.  CT scan obtained on June 30, 2021 did not show any evidence of progression of disease or indication for salvage therapy.  I recommended continued active surveillance and he is combination immunotherapy as a salvage option upon disease progression.  She is agreeable with this plan.   2.  CNS metastasis: CT scan of the head obtained on 06/30/2021 did not show any evidence of metastatic disease.  3.  Prognosis: Her disease is incurable but she had an excellent disease control for an extended period of time without any treatment..   4.  Thrombocytopenia: Overall mild and asymptomatic.  She could have is an element of autoimmunity at this time.  5.  Follow-up: In 6 months for repeat evaluation.  30  minutes were spent on this visit.  The time was dedicated to reviewing laboratory data, disease status update and outlining future plan of care review.   Kaitlyn Button, MD 1/31/202310:20 AM

## 2021-08-11 ENCOUNTER — Encounter: Payer: Self-pay | Admitting: Gastroenterology

## 2021-08-11 ENCOUNTER — Ambulatory Visit (AMBULATORY_SURGERY_CENTER): Payer: Medicare Other | Admitting: Gastroenterology

## 2021-08-11 ENCOUNTER — Other Ambulatory Visit: Payer: Self-pay

## 2021-08-11 VITALS — BP 125/98 | HR 77 | Temp 97.7°F | Resp 15 | Ht 61.0 in | Wt 131.0 lb

## 2021-08-11 DIAGNOSIS — K30 Functional dyspepsia: Secondary | ICD-10-CM | POA: Diagnosis not present

## 2021-08-11 DIAGNOSIS — K449 Diaphragmatic hernia without obstruction or gangrene: Secondary | ICD-10-CM

## 2021-08-11 DIAGNOSIS — R143 Flatulence: Secondary | ICD-10-CM | POA: Diagnosis not present

## 2021-08-11 DIAGNOSIS — R1013 Epigastric pain: Secondary | ICD-10-CM | POA: Diagnosis not present

## 2021-08-11 MED ORDER — SODIUM CHLORIDE 0.9 % IV SOLN: 500.0000 mL | Freq: Once | INTRAVENOUS | Status: DC

## 2021-08-11 NOTE — Patient Instructions (Signed)
Resume previous diet. Continue present medications.  Await pathology results.  YOU HAD AN ENDOSCOPIC PROCEDURE TODAY AT [facility]:   Refer to the procedure report that was given to you for any specific questions about what was found during the examination.  If the procedure report does not answer your questions, please call your gastroenterologist to clarify.  If you requested that your care partner not be given the details of your procedure findings, then the procedure report has been included in a sealed envelope for you to review at your convenience later.  YOU SHOULD EXPECT: Some feelings of bloating in the abdomen. Passage of more gas than usual.  Walking can help get rid of the air that was put into your GI tract during the procedure and reduce the bloating. If you had a lower endoscopy (such as a colonoscopy or flexible sigmoidoscopy) you may notice spotting of blood in your stool or on the toilet paper. If you underwent a bowel prep for your procedure, you may not have a normal bowel movement for a few days.  Please Note:  You might notice some irritation and congestion in your nose or some drainage.  This is from the oxygen used during your procedure.  There is no need for concern and it should clear up in a day or so.  SYMPTOMS TO REPORT IMMEDIATELY:   Following upper endoscopy (EGD)  Vomiting of blood or coffee ground material  New chest pain or pain under the shoulder blades  Painful or persistently difficult swallowing  New shortness of breath  Fever of 100F or higher  Black, tarry-looking stools  For urgent or emergent issues, a gastroenterologist can be reached at any hour by calling [phone]. Do not use MyChart messaging for urgent concerns.    DIET:  We do recommend a small meal at first, but then you may proceed to your regular diet.  Drink plenty of fluids but you should avoid alcoholic beverages for 24 hours.  ACTIVITY:  You should plan to take  it easy for the rest of today and you should NOT DRIVE or use heavy machinery until tomorrow (because of the sedation medicines used during the test).    FOLLOW UP: Our staff will call the number listed on your records 48-72 hours following your procedure to check on you and address any questions or concerns that you may have regarding the information given to you following your procedure. If we do not reach you, we will leave a message.  We will attempt to reach you two times.  During this call, we will ask if you have developed any symptoms of COVID 19. If you develop any symptoms (ie: fever, flu-like symptoms, shortness of breath, cough etc.) before then, please call [phone].  If you test positive for Covid 19 in the 2 weeks post procedure, please call and report this information to [place].    If any biopsies were taken you will be contacted by phone or by letter within the next 1-3 weeks.  Please call us at [phone] if you have not heard about the biopsies in 3 weeks.    SIGNATURES/CONFIDENTIALITY: You and/or your care partner have signed paperwork which will be entered into your electronic medical record.  These signatures attest to the fact that that the information above on your After Visit Summary has been reviewed and is understood.  Full responsibility of the confidentiality of this discharge information lies with you and/or your care-partner.

## 2021-08-11 NOTE — Op Note (Signed)
[place] Patient Name: Kaitlyn Duncan Procedure Date: 08/11/2021 2:41 PM MRN: [id number] Endoscopist: Thornton Park MD, MD Age: 51 Referring MD:  Date of Birth: 25-Nov-1970 Gender: Female Account #: [id number] Procedure:                Upper GI endoscopy Indications:              Gas, eructations, epigastric discomfort, history of                            melanoma diagnosed on EGD 2019 Medicines:                Monitored Anesthesia Care Procedure:                Pre-Anesthesia Assessment:                           - Prior to the procedure, a History and Physical                            was performed, and patient medications and                            allergies were reviewed. The patient's tolerance of                            previous anesthesia was also reviewed. The risks                            and benefits of the procedure and the sedation                            options and risks were discussed with the patient.                            All questions were answered, and informed consent                            was obtained. Prior Anticoagulants: The patient has                            taken no previous anticoagulant or antiplatelet                            agents. ASA Grade Assessment: III - A patient with                            severe systemic disease. After reviewing the risks                            and benefits, the patient was deemed in                            satisfactory condition to undergo the procedure.  After obtaining informed consent, the endoscope was                            passed under direct vision. Throughout the                            procedure, the patient's blood pressure, pulse, and                            oxygen saturations were monitored continuously. The                            Endoscope was introduced through the mouth, and                            advanced to the  third part of duodenum. The upper                            GI endoscopy was accomplished without difficulty.                            The patient tolerated the procedure well. Scope In: Scope Out: Findings:                 The examined esophagus was normal. The z-line was                            located 35 cm from the incisors. Biopsies were                            taken from the distal esophagus with a cold forceps                            for histology. Estimated blood loss was minimal.                           A small hiatal hernia was present.                           The entire examined stomach was normal. Biopsies                            were taken from the antrum, body, and fundus with a                            cold forceps for histology. Estimated blood loss                            was minimal.                           Diffuse mildly erythematous mucosa without active  bleeding and with no stigmata of bleeding was found                            in the duodenal bulb. Biopsies were taken with a                            cold forceps for histology. Estimated blood loss                            was minimal.                           The exam was otherwise without abnormality. Complications:            No immediate complications. Estimated blood loss:                            Minimal. Estimated Blood Loss:     Estimated blood loss was minimal. Impression:               - Normal esophagus. Biopsied.                           - Small hiatal hernia.                           - Normal stomach. Biopsied.                           - Erythematous duodenopathy. Biopsied.                           - The examination was otherwise normal. Recommendation:           - Patient has a contact number available for                            emergencies. The signs and symptoms of potential                            delayed complications were  discussed with the                            patient. Return to normal activities tomorrow.                            Written discharge instructions were provided to the                            patient.                           - Resume previous diet.                           - Continue present medications.                           -  Await pathology results. Thornton Park MD, MD 08/11/2021 3:04:12 PM This report has been signed electronically.

## 2021-08-11 NOTE — Progress Notes (Signed)
VS-DT 

## 2021-08-11 NOTE — Progress Notes (Signed)
Indication for EGD: Gas, eructations, epigastric discomfort, history of melanoma diagnosed on EGD 2019  See office note from 07/24/21 for full details. There has been no change in history or physical exam since that time. The patient remains an appropriate candidate for monitored anesthesia care in the [facility].

## 2021-08-11 NOTE — Progress Notes (Signed)
Called to room to assist during endoscopic procedure.  Patient ID and intended procedure confirmed with present staff. Received instructions for my participation in the procedure from the performing physician.  

## 2021-08-11 NOTE — Progress Notes (Signed)
Pt non-responsive, VVS, Report to RN  °

## 2021-08-13 ENCOUNTER — Telehealth: Payer: Self-pay

## 2021-08-13 NOTE — Telephone Encounter (Signed)
°  Follow up Call-  Call back number 08/11/2021 03/19/2020  Post procedure Call Back phone  # [phone] ask for room [phone]  Permission to leave phone message No Yes  Some recent data might be hidden      Post procedure f/u call attempted, no answer, unable to leave message.

## 2021-08-14 ENCOUNTER — Telehealth: Payer: Self-pay

## 2021-08-14 NOTE — Telephone Encounter (Signed)
Called patient back and got her voicemail. Left her a message that I was glad to hear that she is feeling better and that if she starts vomiting again she can call us but it could be trapped air from her procedure or a virus that she's picked up and to make sure she stayed hydrated.

## 2021-08-14 NOTE — Telephone Encounter (Signed)
Inbound call from patient states after procedure later on in the day she was vomiting a lot. States she is better now. Best contact number [phone] room [phone]

## 2021-09-14 ENCOUNTER — Ambulatory Visit: Payer: Medicare Other | Admitting: Physician Assistant

## 2021-09-23 ENCOUNTER — Ambulatory Visit (INDEPENDENT_AMBULATORY_CARE_PROVIDER_SITE_OTHER)
Admission: EM | Admit: 2021-09-23 | Discharge: 2021-09-23 | Disposition: A | Discharge status: Other Acute Inpatient Hospital | Payer: Medicare Other | Source: Home / Self Care

## 2021-09-23 ENCOUNTER — Emergency Department (HOSPITAL_COMMUNITY)
Admission: EM | Admit: 2021-09-23 | Discharge: 2021-09-25 | Disposition: A | Discharge status: Other Acute Inpatient Hospital | Payer: Medicare Other | Attending: Emergency Medicine | Admitting: Emergency Medicine

## 2021-09-23 DIAGNOSIS — T50916A Underdosing of multiple unspecified drugs, medicaments and biological substances, initial encounter: Secondary | ICD-10-CM | POA: Insufficient documentation

## 2021-09-23 DIAGNOSIS — F200 Paranoid schizophrenia: Secondary | ICD-10-CM | POA: Insufficient documentation

## 2021-09-23 DIAGNOSIS — Z85820 Personal history of malignant melanoma of skin: Secondary | ICD-10-CM | POA: Insufficient documentation

## 2021-09-23 DIAGNOSIS — F29 Unspecified psychosis not due to a substance or known physiological condition: Secondary | ICD-10-CM

## 2021-09-23 DIAGNOSIS — C7931 Secondary malignant neoplasm of brain: Secondary | ICD-10-CM | POA: Insufficient documentation

## 2021-09-23 DIAGNOSIS — Z5901 Sheltered homelessness: Secondary | ICD-10-CM | POA: Insufficient documentation

## 2021-09-23 DIAGNOSIS — Z046 Encounter for general psychiatric examination, requested by authority: Secondary | ICD-10-CM

## 2021-09-23 DIAGNOSIS — Z20822 Contact with and (suspected) exposure to covid-19: Secondary | ICD-10-CM | POA: Diagnosis not present

## 2021-09-23 DIAGNOSIS — Z91128 Patient's intentional underdosing of medication regimen for other reason: Secondary | ICD-10-CM | POA: Insufficient documentation

## 2021-09-23 DIAGNOSIS — R4689 Other symptoms and signs involving appearance and behavior: Secondary | ICD-10-CM

## 2021-09-23 LAB — CBC WITH DIFFERENTIAL/PLATELET
Abs Immature Granulocytes: 0.03 10*3/uL (ref 0.00–0.07)
Basophils Absolute: 0 10*3/uL (ref 0.0–0.1)
Basophils Relative: 0 %
Eosinophils Absolute: 0 10*3/uL (ref 0.0–0.5)
Eosinophils Relative: 0 %
HCT: 37.7 % (ref 36.0–46.0)
Hemoglobin: 12.4 g/dL (ref 12.0–15.0)
Immature Granulocytes: 1 %
Lymphocytes Relative: 19 %
Lymphs Abs: 1 10*3/uL (ref 0.7–4.0)
MCH: 28.2 pg (ref 26.0–34.0)
MCHC: 32.9 g/dL (ref 30.0–36.0)
MCV: 85.7 fL (ref 80.0–100.0)
Monocytes Absolute: 0.5 10*3/uL (ref 0.1–1.0)
Monocytes Relative: 9 %
Neutro Abs: 3.5 10*3/uL (ref 1.7–7.7)
Neutrophils Relative %: 71 %
Platelets: 54 10*3/uL — ABNORMAL LOW (ref 150–400)
RBC: 4.4 MIL/uL (ref 3.87–5.11)
RDW: 12.2 % (ref 11.5–15.5)
WBC: 4.9 10*3/uL (ref 4.0–10.5)
nRBC: 0 % (ref 0.0–0.2)

## 2021-09-23 LAB — COMPREHENSIVE METABOLIC PANEL
ALT: 17 U/L (ref 0–44)
AST: 25 U/L (ref 15–41)
Albumin: 3.9 g/dL (ref 3.5–5.0)
Alkaline Phosphatase: 65 U/L (ref 38–126)
Anion gap: 8 (ref 5–15)
BUN: 23 mg/dL — ABNORMAL HIGH (ref 6–20)
CO2: 23 mmol/L (ref 22–32)
Calcium: 9.1 mg/dL (ref 8.9–10.3)
Chloride: 106 mmol/L (ref 98–111)
Creatinine, Ser: 0.61 mg/dL (ref 0.44–1.00)
GFR, Estimated: >60 mL/min (ref >60)
Glucose, Bld: 115 mg/dL — ABNORMAL HIGH (ref 70–99)
Potassium: 3.6 mmol/L (ref 3.5–5.1)
Sodium: 137 mmol/L (ref 135–145)
Total Bilirubin: 0.5 mg/dL (ref 0.3–1.2)
Total Protein: 6.5 g/dL (ref 6.5–8.1)

## 2021-09-23 LAB — ETHANOL: Alcohol, Ethyl (B): <10 mg/dL (ref <10)

## 2021-09-23 LAB — ACETAMINOPHEN LEVEL: Acetaminophen (Tylenol), Serum: <10 ug/mL — ABNORMAL LOW (ref 10–30)

## 2021-09-23 LAB — RESP PANEL BY RT-PCR (FLU A&B, COVID) ARPGX2
Influenza A by PCR: NEGATIVE
Influenza B by PCR: NEGATIVE
SARS Coronavirus 2 by RT PCR: NEGATIVE

## 2021-09-23 LAB — SALICYLATE LEVEL: Salicylate Lvl: <7 mg/dL — ABNORMAL LOW (ref 7.0–30.0)

## 2021-09-23 MED ORDER — STERILE WATER FOR INJECTION IJ SOLN
INTRAMUSCULAR | Status: AC
  Administered 2021-09-23: 1.2 mL
  Filled 2021-09-23: qty 10

## 2021-09-23 MED ORDER — LORAZEPAM 2 MG/ML IJ SOLN
1.0000 mg | Freq: Once | INTRAMUSCULAR | Status: AC
  Administered 2021-09-23: 1 mg via INTRAMUSCULAR
  Filled 2021-09-23: qty 1

## 2021-09-23 MED ORDER — ZIPRASIDONE MESYLATE 20 MG IM SOLR
20.0000 mg | Freq: Once | INTRAMUSCULAR | Status: AC
Start: 2021-09-23 — End: 2021-09-23
  Administered 2021-09-23: 20 mg via INTRAMUSCULAR
  Filled 2021-09-23: qty 20

## 2021-09-23 NOTE — ED Notes (Signed)
Pt appears pt much calmer now, lying in bed, resting. However, when staff approached her for labs, vital signs, pt opened eyes, refused. States "this is unnecessary, this is against my will". Pt will not let staff touch her.  ?

## 2021-09-23 NOTE — ED Notes (Signed)
Pt very uncooperative, security called to assist. Geodon given. Pt screamed and cuss at staff. Pt addresses herself as "she/her" and not I or me. Pt started referencing to biblical wording while calling everyone names, states "you will all burn in hell!".  ? ?Changed pt to burgundy gown/paper scrub. Belongings wanded by security/ tech assisted. ?

## 2021-09-23 NOTE — ED Notes (Signed)
Pt transferred to [facility] due to requiring higher level of care ?

## 2021-09-23 NOTE — ED Provider Notes (Signed)
?[facility] ?Provider Note ? ? ?CSN: [id number] ?Arrival date & time: 09/23/21  1507 ? ?  ? ?History ? ?Chief Complaint  ?Patient presents with  ? Psychiatric Evaluation  ?  IVC  ? ? ?Kaitlyn Duncan is a 51 y.o. female. ? ?HPI ?Patient is a 51 year old female with a history of brain metastases, melanoma, psychosis, schizophrenia spectrum disorder with psychotic disorder type, who presents to the emergency department under IVC.  [employer] is at bedside and states that DSS placed the patient under IVC.  She has not been taking her regular medications and has become aggressive recently with please officers as well as hotel staff.  Uncooperative with nursing staff as well as police officers.  Denies any SI or HI to me.  States that "she is a child of [name] and we are unworthy and are all going to hell".  She states that "she does not take her medications because she does not need them".  She otherwise keeps her arms crossed and is staring at the television and refusing to answer any questions. ? ?Per IVC placed by DSS: ? ?Kaitlyn Duncan has a diagnosis of schizophrenia with paranoia.  She also has a history of metastatic melanoma to the brain.  She refuses to take her medications and has been out of control for the past few weeks.  We placed her in a hotel due to her being homeless and no other placement options available.  Since being at the hotel she has displayed multiple volatile behaviors and is accused staff of stealing her food, cussed at staff, and called them whores and warlocks.  This past weekend Kaitlyn Duncan tampered with the elevator by inserting a screwdriver and the keel and set the alarm off.  The fire department was dispatched to the property deal with the situation.  The hotel has declined to renew her stay for another week.  In December 2022, Kaitlyn Duncan was asked to leave another hotel and barricaded herself in the bathroom and refused to come out.  [employer] [employer]  and the behavioral response unit had to respond.  Kaitlyn Duncan believes she can communicate with people to the spirit.  When I or her ACTT team tried to contact her, she accuses [place] of being deceptive, causes witches, yells at the top of her lungs and closes the door.  She has a history of causing disturbances in the community and has been banned from multiple places.  She currently presents disoriented with delusions and hallucinations.  She is often agitated and aggressive with authorities.  She was deemed incompetent on March 03, 2021 and [place] was appointed guardian. ?  ? ?Home Medications ?Prior to Admission medications   ?Not on File  ?   ? ?Allergies    ?Patient has no known allergies.   ? ?Review of Systems   ?Review of Systems  ?All other systems reviewed and are negative. ?Ten systems reviewed and are negative for acute change, except as noted in the HPI.   ?Physical Exam ?Updated Vital Signs ?BP 100/72   Pulse 87   Temp 98.3 ?F (36.8 ?C)   Resp 13   SpO2 97%  ?Physical Exam ?Vitals and nursing note reviewed.  ?Constitutional:   ?   General: She is not in acute distress. ?   Appearance: Normal appearance. She is not ill-appearing, toxic-appearing or diaphoretic.  ?   Comments: Lying in bed with her arms crossed.  Providing brief answers to questions  but otherwise refusing to speak to me.  ?HENT:  ?   Head: Normocephalic and atraumatic.  ?   Right Ear: External ear normal.  ?   Left Ear: External ear normal.  ?   Nose: Nose normal.  ?   Mouth/Throat:  ?   Mouth: Mucous membranes are moist.  ?   Pharynx: Oropharynx is clear. No oropharyngeal exudate or posterior oropharyngeal erythema.  ?Eyes:  ?   Extraocular Movements: Extraocular movements intact.  ?Cardiovascular:  ?   Rate and Rhythm: Normal rate and regular rhythm.  ?   Pulses: Normal pulses.  ?   Heart sounds: Normal heart sounds. No murmur heard. ?  No friction rub. No gallop.  ?Pulmonary:  ?   Effort: Pulmonary effort is normal. No  respiratory distress.  ?   Breath sounds: Normal breath sounds. No stridor. No wheezing, rhonchi or rales.  ?Abdominal:  ?   General: Abdomen is flat.  ?   Palpations: Abdomen is soft.  ?   Tenderness: There is no abdominal tenderness.  ?Musculoskeletal:     ?   General: Normal range of motion.  ?   Cervical back: Normal range of motion and neck supple. No tenderness.  ?Skin: ?   General: Skin is warm and dry.  ?Neurological:  ?   General: No focal deficit present.  ?   Mental Status: She is alert and oriented to person, place, and time.  ?Psychiatric:     ?   Attention and Perception: She is inattentive.     ?   Mood and Affect: Mood normal. Affect is angry.     ?   Speech: She is noncommunicative.     ?   Behavior: Behavior is agitated and withdrawn.     ?   Thought Content: Thought content is delusional.  ? ?ED Results / Procedures / Treatments   ?Labs ?(all labs ordered are listed, but only abnormal results are displayed) ?Labs Reviewed  ?COMPREHENSIVE METABOLIC PANEL - Abnormal; Notable for the following components:  ?    Result Value  ? Glucose, Bld 115 (*)   ? BUN 23 (*)   ? All other components within normal limits  ?CBC WITH DIFFERENTIAL/PLATELET - Abnormal; Notable for the following components:  ? Platelets 54 (*)   ? All other components within normal limits  ?ACETAMINOPHEN LEVEL - Abnormal; Notable for the following components:  ? Acetaminophen (Tylenol), Serum <10 (*)   ? All other components within normal limits  ?SALICYLATE LEVEL - Abnormal; Notable for the following components:  ? Salicylate Lvl <5.1 (*)   ? All other components within normal limits  ?RESP PANEL BY RT-PCR (FLU A&B, COVID) ARPGX2  ?ETHANOL  ?RAPID URINE DRUG SCREEN, HOSP PERFORMED  ? ? ?EKG ?None ? ?Radiology ?No results found. ? ?Procedures ?Procedures  ? ?Medications Ordered in ED ?Medications  ?ziprasidone (GEODON) injection 20 mg (20 mg Intramuscular Given 09/23/21 1618)  ?sterile water (preservative free) injection (1.2 mLs  Given  09/23/21 1619)  ?LORazepam (ATIVAN) injection 1 mg (1 mg Intramuscular Given 09/23/21 1807)  ? ? ?ED Course/ Medical Decision Making/ A&P ?Clinical Course as of 09/23/21 2113  ?Wed Sep 23, 2021  ?1604 Patient combative with myself, please officers, as well as nursing staff.  Refusing any interventions.  Patient will require IM Geodon.  Last 4 EKGs were reviewed and showed no sign of QTc prolongation. [LJ]  ?  ?Clinical Course User Index ?[LJ] Rayna Sexton, PA-C  ? ?                        ?  Medical Decision Making ?Amount and/or Complexity of Data Reviewed ?Labs: ordered. ? ?Risk ?Prescription drug management. ? ?Pt is a 51 y.o. female with a complex psychiatric history who presents to the emergency department under IVC.  Patient is under DSS custody and currently living at a hotel.  DSS placed the patient under IVC. ? ?Labs: ?CBC with platelets of 54. ?CMP with a glucose of 115 and a BUN of 23. ?Ethanol less than 10. ?Acetaminophen less than 10. ?Salicylate less than 7. ?Respiratory panel is negative. ? ?I, Rayna Sexton, PA-C, personally reviewed and evaluated these images and lab results as part of my medical decision-making. ? ?On my exam patient is angry and appears delusional.  Speaking erratically and not providing appropriate answers to questions.  Does not follow commands.  She became combative with myself as well as the emergency department staff and required Geodon as well as Ativan. ? ?Lab work with abnormalities as noted above.  Thrombocytopenia 54 that appears slightly lower than the patient's baseline.  Otherwise, patient's lab work appears generally reassuring.  Feel that she is medically cleared at this time.  TTS consultation has been placed.  Disposition pending based on TTS recommendations. ? ?Note: Portions of this report may have been transcribed using voice recognition software. Every effort was made to ensure accuracy; however, inadvertent computerized transcription errors may be present.   ? ?Final Clinical Impression(s) / ED Diagnoses ?Final diagnoses:  ?Psychosis, unspecified psychosis type ([place])  ?Aggressive behavior  ?Involuntary commitment  ? ? ?Rx / DC Orders ?ED Discharge Orders   ? ? No

## 2021-09-23 NOTE — ED Triage Notes (Addendum)
Pt BIB law enforcement from a hotel, IVC paperwork brought in by officer. Per officer, pt was known to have schizophrenia and hasn't been taking her medications, homeless and stays at hotels. Per officer, pt became aggressive to hotel staffs, very uncooperative, denies SI. Pt is under DSS custody.  ? ?Pt refused to answer all triage questions, will not verify name or basic information. Pt talked very rudely stating "I dont need your help", refused to change into a gown, refused vital signs to be checked, pt arms crossed and stares at the tv the entire time. Officers outside pt's room.  ?

## 2021-09-23 NOTE — ED Notes (Signed)
Patient refused vitals again ?

## 2021-09-23 NOTE — ED Provider Notes (Signed)
Behavioral Health Urgent Care Medical Screening Exam ? ?Patient Name: Kaitlyn Duncan ?MRN: [id number] ?Date of Evaluation: 09/23/21 ?Chief Complaint:   ?Diagnosis:  ?Final diagnoses:  ?Schizophrenia spectrum disorder with psychotic disorder type not yet determined ([place])  ?Involuntary commitment  ? ? ?History of Present illness: Kaitlyn Duncan is a 51 y.o. female. Patient presents to [facility] behavioral health under involuntary commitment petition, transported by Event organiser.  Petition initiated by [place] legal [place].  Involuntary commitment petition reads: "Kaitlyn Duncan has a diagnosis of schizophrenia with paranoia.  She also has a history of metastatic melanoma to the brain.  She refuses to take her medications and has been out of control for the past few weeks.  We placed her in a hotel due to her being homeless and no other placement options available.  Since being at the hotel she has displayed multiple volatile behaviors and has accused staff of stealing her food, cussed at staff and called them whores and warlocks.  This past weekend Kaitlyn Duncan tampered with the elevator by inserting an screwdriver in the keyhole and set the alarm off.  The fire department was dispatched to the property to deal with the situation.  The hotel has declined to renew her stay for another week.  In December 2022, Kaitlyn Duncan was asked to leave another hotel and barricaded herself in the bathroom and refused to come out.  Law enforcement and the behavioral response unit had to respond.  Kaitlyn Duncan believes she can communicate with people through this..  When I or her ACTT team try to contact her, she accuses [place] of being deceptive, calls [place] witches, yells at the top of her lungs and closes the door.  She has a history of causing disturbances in the community and has been banned from multiple places.  She currently presents disoriented with delusions  and hallucinations.  She is often agitated and aggressive with authorities.  She was deemed incompetent on March 03, 2021 and [place] was appointed guardian." ? ?Attempted to evaluate patient.  She has refused to speak with this Probation officer, also refuses to speak with TTS staff.  She has also refused vital signs.  Upon my approach patient is sitting in the bathroom, hit down on her knees.  She refuses to respond verbally at this time. ?Patient is independent and able to ambulate to the bathroom without assistance. ? ?Patient has refused to speak with this Probation officer, also refuses to engage in assessment with TTS counselor.  She has also refused vital signs at this time. ? ?Kaitlyn Duncan is able to ambulate without assistance. ?Patient offered support and encouragement.  She remains under involuntary commitment petition at this time. ? ?Spoke with patient's [place] legal guardian, [name] who states "I think she should be placed."  Patient's legal guardian states "patient has chronically experienced ongoing paranoia, hallucinations and delusions since September 2022."  Kaitlyn Duncan was assigned as Kaitlyn Duncan legal guardian in September 2022.  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 ?Kaitlyn Duncan has been diagnosed with schizophrenia.  Legal guardian reports she is not compliant with medications and has not been compliant with medications since discharge from inpatient psychiatric treatment at [facility] behavioral health in January 2023.  Per legal guardian outpatient psychiatry provider, [facility], is unable to encourage patient to  be compliant with medications and patient has repeatedly refused long-acting injectable medication. ? ?Psychiatric Specialty Exam ? ?Presentation  ?General Appearance:Fairly Groomed ? ?Eye  Contact:None ? ?Speech:Other (comment) (patient does not verbally respond to this Probation officer) ? ?Speech Volume:Normal ? ?Handedness:Right ? ? ?Mood and Affect  ?Mood:Irritable ? ?Affect:Congruent ? ? ?Thought Process  ?Thought Processes:Goal Directed ? ?Descriptions of Associations:Circumstantial ? ?Orientation:Full (Time, Place and Person) ? ?Thought Content:Illogical; Delusions ? Diagnosis of Schizophrenia or Schizoaffective disorder in past: Yes ? Duration of Psychotic Symptoms: Greater than six months ? Hallucinations:-- (not endorsing today, refuses to answer these questions) ?Hear people in demonic room ?None today ? ?Ideas of Reference:Delusions ? ?Suicidal Thoughts:-- (not endorsing, but wont officially answer, was negative with RN) ? ?Homicidal Thoughts:-- (not endorsing, but denied to RN wont answer with MD) ? ? ?Sensorium  ?Memory:Immediate Fair; Recent Poor; Remote Poor ? ?Judgment:Impaired ? ?Insight:None ? ? ?Executive Functions  ?Concentration:Fair ? ?Attention Span:Fair ? ?Recall:Poor ? ?Fund of Knowledge:Poor ? ?Language:Fair ? ? ?Psychomotor Activity  ?Psychomotor Activity:Normal ? ? ?Assets  ?Assets:Resilience ? ? ?Sleep  ?Sleep:Fair ? ?Number of hours: 7.25 ? ? ?No data recorded ? ?Physical Exam: ?Physical Exam ?Nursing note reviewed. Vitals reviewed: Patient refuses vital signs. ?Constitutional:   ?   Appearance: Normal appearance. She is normal weight.  ?HENT:  ?   Head: Normocephalic and atraumatic.  ?   Nose: Nose normal.  ?Musculoskeletal:     ?   General: Normal range of motion.  ?   Cervical back: Normal range of motion.  ? ?ROS ?There were no vitals taken for this visit. There is no height or weight on file to calculate BMI. ? ?Musculoskeletal: ?Strength & Muscle Tone: within normal limits ?Gait & Station: normal ?Patient leans: N/A ? ? ?[facility] MSE Discharge Disposition for Follow up and Recommendations: ?Based on my evaluation the patient appears to have an emergency medical condition for  which I recommend the patient be transferred to the emergency department for further evaluation.  ?Patient reviewed with Dr. Serafina Mitchell.  Ambria has a history of schizophrenia as well as melanoma with brain metastasis.  She has been accepted to [facility] emergency department by Dr. Pearline Cables for medical clearance. She remains under involuntary commitment petition, upheld by this Probation officer.  ?Recommend inpatient psychiatric hospitalization at this time.  ? ?Lucky Rathke, FNP ?09/23/2021, 2:42 PM ? ?

## 2021-09-23 NOTE — ED Notes (Signed)
Pt got out of bed, started cussing at officers, calling everyone names, trying to close the curtain. Pt is very upset that her clothes were taken away and now she's wearing the purple gown. Pt states "she is my daughter (while pointing at her epigastric), the [facility] resides in her. This is illegal activity, you have no right to strip her". ?

## 2021-09-23 NOTE — Progress Notes (Signed)
?   09/23/21 1429  ?[place] Triage Screening (Walk-ins at [facility] only)  ?How Did You Hear About [place]? Legal System  ?What Is the Reason for Your Visit/Call Today? Patient presents via [employer] under IVC, initiated by her guardian with DSS, [name].  Per IVC, guardian is concerned patient is not taking medications.  She is followed by [facility],however she has been refusing to meet with them, often screaming and calling them witches and deceptive when they arrive to meet with her.  She has been closing the door, refusing to engage.  She believes she can communicate with people throught the spirit.  She has been tampering with an elevator in the hotel her guardian placed her in.  She was also accusing staff at the hotel of stealing her things and tampering with an elevator, causing an alarm to go off. Currently, patient is refusing to speak with clinician or provider.  Unable to assess for level of risk in her current state.  ?How Long Has This Been Causing You Problems? > than 6 months  ?Have You Recently Had Any Thoughts About Hurting Yourself?  ?(UTA)  ?Are You Planning to Commit Suicide/Harm Yourself At This time?  ?(UTA)  ?Have you Recently Had Thoughts About [place]?  ?(UTA)  ?Are You Planning To Harm Someone At This Time?  ?(UTA)  ?Are you currently experiencing any auditory, visual or other hallucinations?  ?(UTA)  ?Have You Used Any Alcohol or Drugs in the Past 24 Hours?  ?(UTA)  ?Do you have any current medical co-morbidities that require immediate attention?  ?(UTA)  ?Clinician description of patient physical appearance/behavior: Patient is dressed in some type of gown, keeps her head down and refuses to speak to staff.  ?What Do You Feel Would Help You the Most Today? Treatment for Depression or other mood problem  ?If access to [facility] was not available, would you have sought care in the Emergency Department? Yes  ?Determination of Need Urgent (48 hours)  ?Options For Referral Inpatient  Hospitalization  ? ? ?

## 2021-09-23 NOTE — Progress Notes (Signed)
@   8:50pm CSW sent [place], RN communication requesting to review pt at [facility]. CSW will assist and follow with placement.  ? ? ?CSW received communication back from [facility] to send out referral due to no appropriate beds at [facility].  ? ?Inpatient Behavioral Health  ? ?Pt meets inpatient criteria per Beatriz Stallion, FNP.  Referral was sent to the following facilities;  ? ?Destination ?Service Provider Address Phone Fax  ?[facility]  [address] [place] [address] [phone]  ?[place] [address], [place] [address] [phone]  ?[facility]  [address] [place] [address] [phone]  ?[place], [place] [address] [phone]  ?[facility]  [place], [place] [address] [phone]  ?[facility] [address] [place] [address] [phone]  ?[place]  [place], [place] [address] [phone]  ?[place] [address] [place] [address] [phone]  ?[facility]  [address], [place] [address] [phone]  ?[place]  [address], [place] [address] [phone]  ?[facility]  [address], [place] [address] [phone]  ?[facility]  [address] [place] [address] [phone]  ?[place]  [address] [place] [address] [phone]  ?[place] [address], [place] [address] [phone]  ?[facility]  [place], [place] [address]  [phone]  ?[facility]  [place], [place] [address] [phone]  ?[facility] [place]      ? ? ?Situation ongoing,  CSW will follow up. ? ? ?Benjaman Kindler, MSW, LCSWA ?09/23/2021  @ 10:22 PM ? ?

## 2021-09-23 NOTE — ED Notes (Signed)
Pt getting agitated again once staff goes in her room. Tech and another nurse assisted in Ativan administration.  ? ?

## 2021-09-23 NOTE — ED Notes (Signed)
Informed provider that Geodon did not work. Pt is still very aggressive and agitated. Security was already called 2x to assist. With that said, this RN is unable do tasks yet. Pt is refusing EKG, labs, vital signs. Refuses to go back to bed, sitting on the chair in the room eating snacks at the moment.  ?
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
## 2021-09-23 NOTE — ED Notes (Signed)
Patient refused vitals aeb non-cooperation  ?

## 2021-09-24 ENCOUNTER — Emergency Department (HOSPITAL_COMMUNITY): Payer: Medicare Other

## 2021-09-24 DIAGNOSIS — F29 Unspecified psychosis not due to a substance or known physiological condition: Secondary | ICD-10-CM | POA: Diagnosis not present

## 2021-09-24 LAB — RAPID URINE DRUG SCREEN, HOSP PERFORMED
Amphetamines: NOT DETECTED
Barbiturates: NOT DETECTED
Benzodiazepines: NOT DETECTED
Cocaine: NOT DETECTED
Opiates: NOT DETECTED
Tetrahydrocannabinol: NOT DETECTED

## 2021-09-24 MED ORDER — ACETAMINOPHEN 500 MG PO TABS: 1000.0000 mg | ORAL_TABLET | Freq: Four times a day (QID) | ORAL | Status: DC | PRN

## 2021-09-24 MED ORDER — BENZTROPINE MESYLATE 1 MG/ML IJ SOLN
0.5000 mg | Freq: Two times a day (BID) | INTRAMUSCULAR | Status: DC
  Administered 2021-09-24: 0.5 mg via INTRAMUSCULAR
  Filled 2021-09-24 (×2): qty 2

## 2021-09-24 MED ORDER — ACETAMINOPHEN 500 MG PO TABS
1000.0000 mg | ORAL_TABLET | Freq: Once | ORAL | Status: AC
  Administered 2021-09-24: 1000 mg via ORAL
  Filled 2021-09-24: qty 2

## 2021-09-24 MED ORDER — HYDROXYZINE HCL 25 MG PO TABS
25.0000 mg | ORAL_TABLET | Freq: Four times a day (QID) | ORAL | Status: DC | PRN
  Filled 2021-09-24: qty 1

## 2021-09-24 MED ORDER — FLUPHENAZINE HCL 5 MG PO TABS
10.0000 mg | ORAL_TABLET | Freq: Two times a day (BID) | ORAL | Status: DC
  Filled 2021-09-24 (×2): qty 2

## 2021-09-24 NOTE — ED Notes (Signed)
ED Provider at bedside. 

## 2021-09-24 NOTE — Progress Notes (Signed)
Patient has been denied by [facility] due to the provider suspecting that the Pt would not be appropriate for the unit. Patient meets [place] inpatient criteria per Beatriz Stallion, NP. Patient has been faxed out to the following facilities:  ? ?[facility]  [address] [place] [address] [phone]  ?[place] [address], [place] [address] [phone]  ?[facility]  [address] [place] [address] [phone]  ?[place], [place] [address] [phone]  ?[facility]  [place], [place] [address] [phone]  ?[facility] [address] [place] [address] [phone]  ?[place]  [place], [place] [address] [phone]  ?[place] [address] [place] [place] [address] [phone]  ?[facility]  [address], [place] [address] [phone]  ?[place]  [address], [place] [address] [phone]  ?[facility]  [address] [place] [address] [phone]  ?[facility]  [address] [place] [address] [phone]  ?[place]  [address] [place] [address] [phone]  ?[facility] [address], [place] [address] [phone]  ?[facility]  [place], [place] [address] [phone]  ?[facility]  [place], [place] [address] [phone]  ?[facility]  [address] [place] [address]  [phone]  ? ?Mariea Clonts, MSW, LCSW-A  ?10:53 AM 09/24/2021   ?

## 2021-09-24 NOTE — ED Notes (Signed)
Pt's breakfast has arrived. Pt awoken from their sleep, sat up and eating her breakfast.  ?

## 2021-09-24 NOTE — ED Notes (Addendum)
Pt refused her medicine. RN requested extra hands to help with pt. Pt became slightly aggressive, raising their hands at RN. Pt continued to talk about being 'in the highest realm. My Fatherly [name] will help me, he fixes me up or something like that. I am not like all the lower realm  people!". Pt now has her face covered with sheets, writer will allow pt to de-stress a bit before removing blanket due to pt being highly aggressive yesterday/history of aggression.  ?

## 2021-09-24 NOTE — ED Provider Notes (Addendum)
Asked to see patient by nursing staff.  Patient had an unwitnessed fall.  Patient reports that she fell while in the bathroom, landing onto her backside.  Patient complaining of low back and buttock area pain.  She did not hit her head or lose consciousness.  No upper back or neck pain. ? ?Examination reveals diffuse soft tissue tenderness in the lumbar area.  Normal range of motion of both hips.  Normal strength in both lower extremities.  Normal sensation.  Will perform lumbar spine and pelvis x-rays. ?  ?Addendum: X-rays negative, no further work-up necessary. ? ?Orpah Greek, MD ?09/24/21 0421 ? ?  ?Orpah Greek, MD ?09/24/21 [phone] ? ?

## 2021-09-24 NOTE — ED Notes (Signed)
Pt's breakfast tray is late due to no diet order. Pt provided a sandwich, apple sauce and orange juice. Pt made aware that breakfast will be late. Pt complaint with this.  ?

## 2021-09-24 NOTE — ED Notes (Addendum)
Error

## 2021-09-24 NOTE — ED Notes (Signed)
Pt got up from bed and was unsteady. Writer asked pt if she would like any help. Pt said no, writer then walked beside pt to the bathroom and pt attempted to close the door. Writer has been inside/left the door cracked open where Probation officer can see pt in the bathroom. Pt became very aggressive, stating "WHY DO YOU WANT TO [place]?! HOW DARE YOU! THIS IS ILLEGAL, UNETHICAL, IMMORTAL! YOU ARE HURTING HER(Pt referring to self as her)". Polly EMT came in to assist with pt but pt continued to be yelling at Probation officer and EMT. Pt continued to yell, "YOU WILL ALL BURN IN HELL, Wynton Hufstetler AND POLLY AND MARA". Rn at this point came in to assist. Pt was allowed to close the door but this writer left a small crack. Pt back in her room, writer able to see pt through the door ?

## 2021-09-24 NOTE — ED Notes (Signed)
Pt continues to make noises such as "chica-chica-chica", when asked if vitals can be done... Pt stated "I do not need that, I am blessed by [name].". Writer continued to get her vitals while the pt verbally declined. When done, pt covered her head with the blanket. Pt continues to speak to herself as if praying  ?

## 2021-09-24 NOTE — ED Notes (Signed)
Pt's dinner has arrived. Pt sat up and eating her dinner ?

## 2021-09-24 NOTE — ED Notes (Signed)
Patient refused all night meds and states "I don't need any medication"-Monique,RN  ?

## 2021-09-24 NOTE — ED Provider Notes (Signed)
Emergency Medicine Observation Re-evaluation Note ? ?Kaitlyn Duncan is a 51 y.o. female, seen on rounds today.  Pt initially presented to the ED for complaints of BH evaluation/IVC.  It appears guardian/DSS had no placement for patient, and so di IVC and brought to ED. Pt does have chronic mental health issues, and does appear delusional. No new c/o this AM, eating breakfast.  ? ?Physical Exam  ?BP 128/87 (BP Location: Right Arm)   Pulse 84   Temp 97.9 ?F (36.6 ?C) (Oral)   Resp 16   SpO2 97%  ?Physical Exam ?General: alert, content.  ?Cardiac: regular rate.  ?Lungs: breathing comfortably. ?Psych: alert, content. Normal mood and affect. Denies SI/HI. Pt does appear to be responding to internal stimuli - delusional, paranoid thoughts.  ? ?ED Course / MDM  ? ? ?I have reviewed the labs performed to date as well as medications administered while in observation.  Recent changes in the last 24 hours include ED obs, med management, and BH reassessment.  ? ?Plan  ? ?[place] Shadow [place] is under involuntary commitment. ?  ?[place] placement pending. Pt not currently receiving her normal po BH meds - will re-order.  Will ask [place] team to reassess, med recs, and update placement efforts. ? ? ?  ?Lajean Saver, MD ?09/24/21 [phone] ? ?

## 2021-09-24 NOTE — ED Notes (Signed)
Pt's lunch has arrived, pt sat up in bed and eating her lunch ?

## 2021-09-24 NOTE — ED Notes (Signed)
Pt was able to walk better than this morning to the bathroom. Pt stated that she did not need any help, writer maintained the door opened and eyes on pt. Pt walked back to her room without any difficulty. Pt does remain grabbing onto things to better support herself ?

## 2021-09-24 NOTE — ED Notes (Signed)
Bottom cabinet not able to lock but is empty, RN aware, all other cabinets and drawers locked. Room is clear.  ?

## 2021-09-24 NOTE — ED Notes (Signed)
Pt awake, watching tv, cooperative at the moment. Sitter at the bedside.  ?

## 2021-09-24 NOTE — ED Notes (Signed)
Pt walk to the bathroom. Pt was still a little unsteady with her walking. Pt complaining of pain, requesting tylenol. RN aware.  ?

## 2021-09-25 ENCOUNTER — Inpatient Hospital Stay (HOSPITAL_COMMUNITY)
Admission: AD | Admit: 2021-09-25 | Discharge: 2021-10-12 | DRG: 885 | Disposition: A | Discharge status: Home / Self Care | Payer: Medicare Other | Source: Intra-hospital | Attending: Psychiatry | Admitting: Psychiatry

## 2021-09-25 DIAGNOSIS — C7972 Secondary malignant neoplasm of left adrenal gland: Secondary | ICD-10-CM | POA: Diagnosis present

## 2021-09-25 DIAGNOSIS — F319 Bipolar disorder, unspecified: Secondary | ICD-10-CM | POA: Diagnosis present

## 2021-09-25 DIAGNOSIS — R251 Tremor, unspecified: Secondary | ICD-10-CM | POA: Diagnosis present

## 2021-09-25 DIAGNOSIS — F209 Schizophrenia, unspecified: Secondary | ICD-10-CM | POA: Diagnosis present

## 2021-09-25 DIAGNOSIS — I10 Essential (primary) hypertension: Secondary | ICD-10-CM | POA: Diagnosis present

## 2021-09-25 DIAGNOSIS — Z808 Family history of malignant neoplasm of other organs or systems: Secondary | ICD-10-CM | POA: Diagnosis not present

## 2021-09-25 DIAGNOSIS — Z765 Malingerer [conscious simulation]: Secondary | ICD-10-CM | POA: Diagnosis not present

## 2021-09-25 DIAGNOSIS — Z5900 Homelessness unspecified: Secondary | ICD-10-CM | POA: Diagnosis not present

## 2021-09-25 DIAGNOSIS — C7931 Secondary malignant neoplasm of brain: Secondary | ICD-10-CM | POA: Diagnosis present

## 2021-09-25 DIAGNOSIS — C439 Malignant melanoma of skin, unspecified: Secondary | ICD-10-CM | POA: Diagnosis present

## 2021-09-25 DIAGNOSIS — Z91148 Patient's other noncompliance with medication regimen for other reason: Secondary | ICD-10-CM | POA: Diagnosis not present

## 2021-09-25 DIAGNOSIS — F200 Paranoid schizophrenia: Secondary | ICD-10-CM | POA: Diagnosis not present

## 2021-09-25 DIAGNOSIS — K219 Gastro-esophageal reflux disease without esophagitis: Secondary | ICD-10-CM | POA: Diagnosis present

## 2021-09-25 DIAGNOSIS — F203 Undifferentiated schizophrenia: Secondary | ICD-10-CM | POA: Diagnosis present

## 2021-09-25 DIAGNOSIS — C7971 Secondary malignant neoplasm of right adrenal gland: Secondary | ICD-10-CM | POA: Diagnosis present

## 2021-09-25 DIAGNOSIS — Z85820 Personal history of malignant melanoma of skin: Secondary | ICD-10-CM | POA: Diagnosis not present

## 2021-09-25 MED ORDER — BENZTROPINE MESYLATE 0.5 MG PO TABS
0.5000 mg | ORAL_TABLET | Freq: Two times a day (BID) | ORAL | Status: DC
  Filled 2021-09-25 (×7): qty 1

## 2021-09-25 MED ORDER — HALOPERIDOL LACTATE 5 MG/ML IJ SOLN
INTRAMUSCULAR | Status: AC
  Administered 2021-09-25: 5 mg
  Filled 2021-09-25: qty 1

## 2021-09-25 MED ORDER — MAGNESIUM HYDROXIDE 400 MG/5ML PO SUSP: 30.0000 mL | Freq: Every day | ORAL | Status: DC | PRN

## 2021-09-25 MED ORDER — LORAZEPAM 2 MG/ML IJ SOLN
1.0000 mg | Freq: Once | INTRAMUSCULAR | Status: AC
  Administered 2021-09-25: 1 mg via INTRAMUSCULAR

## 2021-09-25 MED ORDER — HYDROXYZINE HCL 25 MG PO TABS: 25.0000 mg | ORAL_TABLET | Freq: Three times a day (TID) | ORAL | Status: DC | PRN

## 2021-09-25 MED ORDER — FLUPHENAZINE HCL 10 MG PO TABS
10.0000 mg | ORAL_TABLET | Freq: Two times a day (BID) | ORAL | Status: DC
  Filled 2021-09-25 (×6): qty 1

## 2021-09-25 MED ORDER — ALUM & MAG HYDROXIDE-SIMETH 200-200-20 MG/5ML PO SUSP: 30.0000 mL | ORAL | Status: DC | PRN

## 2021-09-25 MED ORDER — ACETAMINOPHEN 325 MG PO TABS
650.0000 mg | ORAL_TABLET | Freq: Four times a day (QID) | ORAL | Status: DC | PRN
  Administered 2021-10-10: 650 mg via ORAL
  Filled 2021-09-25 (×2): qty 2

## 2021-09-25 MED ORDER — FLUPHENAZINE HCL 2.5 MG/ML IJ SOLN
2.5000 mg | Freq: Once | INTRAMUSCULAR | Status: DC
  Filled 2021-09-25: qty 1

## 2021-09-25 MED ORDER — LORAZEPAM 2 MG/ML IJ SOLN
INTRAMUSCULAR | Status: AC
  Filled 2021-09-25: qty 1

## 2021-09-25 MED ORDER — HALOPERIDOL LACTATE 5 MG/ML IJ SOLN: 2.0000 mg | Freq: Four times a day (QID) | INTRAMUSCULAR | Status: DC | PRN

## 2021-09-25 NOTE — Progress Notes (Addendum)
Patient is in the bed sleeping at this time. Staff will continue to monitor. Staff is unable to continue with unit admission process due to patient non-compliance.  This Probation officer will report to incoming nurse  to revisit unit admission process.  ?

## 2021-09-25 NOTE — Progress Notes (Signed)
Patient arrived to adult [facility] with police escort. Patient refused to come into the unit, screaming at everyone and stating "I don't belong here." Patient tried leaving, she was unable to go out of the door due to closed door, police and security guarding her. MD witnessed patient behavior and ordered medication to calm patient down. Haldol 5 mg and ativan 1 mg injection were administered to patient on the right deltoid at 1352. These medication were overrode in the pyxis due to medication not verified by pharmacy at the time of administration . Patient was led to the room with the  help of staff, police officer and security. Patient is at this time sitting in her room. Staff will continue to support and monitor patient. ?

## 2021-09-25 NOTE — Progress Notes (Signed)
Pt is sleeping at this time. Will continue to monitor and try to give evening medications if patient wakes up. ?

## 2021-09-25 NOTE — Progress Notes (Signed)
BHH/BMU LCSW Progress Note ?  ?09/25/2021    11:34 AM ? ?[name]  ? ?[id number]  ? ?Type of Contact and Topic:  Psychiatric Bed Placement  ? ?Pt accepted to [facility] 507-01    ? ?Patient meets inpatient criteria per Beatriz Stallion, NP  ? ?The attending provider will be Dr. Berdine Addison ? ?Call report to [phone]   ? ?Shuronia Pflueger, RN @ [facility] ED notified.    ? ?Pt scheduled  to arrive at [facility] at 1230.  ? ? ?Mariea Clonts, MSW, LCSW-A  ?11:35 AM 09/25/2021   ?  ? ?  ?  ? ? ? ? ?  ?

## 2021-09-25 NOTE — Progress Notes (Signed)
Patient has been faxed due to [facility] not having any beds available. Patient meets [place] inpatient criteria per Beatriz Stallion, NP. Patient has been faxed out to the following facilities:  ? ? ?[facility]  [address] [place] [address] [phone]  ?[place] [address], [place] [address] [phone]  ?[facility]  [address] [place] [address] [phone]  ?[place], [place] [address] [phone]  ?[facility]  [place], [place] [address] [phone]  ?[facility] [address] [place] [address] [phone]  ?[place]  [place], [place] [address] [phone]  ?[place] [place] [address] [phone]  ?[facility]  [address], [place] [address] [phone]  ?[place]  [address], [place] [address] [phone]  ?[facility]  [address], [place] [address] [phone]  ?[facility]  [address], [place] [address] [phone]  ?[place]  [place] [place] [address] [phone]  ?[place] [address], [place] [address] [phone]  ?[facility]  [place], [place] [address] [phone]  ?[facility]  [place], [place] [address] [phone]  ?[facility]  [address] [place] [address] [phone]  ?  ?Mariea Clonts,  MSW, LCSW-A  ?11:29 AM 09/25/2021   ?

## 2021-09-25 NOTE — Consult Note (Signed)
This provider connected with the patient via tele-machine to complete a follow up assessment. On evaluation, patient is observed to be sitting up in bed eating breakfast. Patient initially looked at the camera when greeted by this provider but then turned her head away from the camera. Patient remained selectively mute and refused to participate in the assessment.  ? ?Per Shuronia,RN., Patient has been refusing to take medications.  ? ?Disposition: Patient continues to be recommended for inpatient psychiatric treatment. Psychiatry CSW to assist with placement.  ?

## 2021-09-25 NOTE — ED Notes (Signed)
Room checked, one cabinet that doesn't lock, no contraband found ?

## 2021-09-25 NOTE — ED Provider Notes (Signed)
Emergency Medicine Observation Re-evaluation Note ? ?Kaitlyn Duncan is a 51 y.o. female, seen on rounds today.  Pt initially presented to the ED for complaints of Psychiatric Evaluation (IVC) ?Currently, the patient is hiding under the covers but resting. ? ?Physical Exam  ?BP 140/83 (BP Location: Right Arm)   Pulse 73   Temp 98 ?F (36.7 ?C) (Oral)   Resp 17   SpO2 95%  ?Physical Exam ?General: Resting without agitation ?Cardiac: No murmur ?Lungs: Clear ?Psych: No acute agitation. ? ?ED Course / MDM  ?EKG:EKG Interpretation ? ?Date/Time:  Wednesday September 23 2021 18:35:52 EDT ?Ventricular Rate:  95 ?PR Interval:  148 ?QRS Duration: 95 ?QT Interval:  369 ?QTC Calculation: 464 ?R Axis:   92 ?Text Interpretation: Sinus rhythm Borderline right axis deviation No acute changes Confirmed by Addison Lank [phone]) on 09/24/2021 7:27:05 PM ? ?I have reviewed the labs performed to date as well as medications administered while in observation.  Recent changes in the last 24 hours include awaiting psychiatric reassessment but otherwise no changes overnight. ? ?Plan  ?Current plan is for placement. ?Kaitlyn Duncan is under involuntary commitment. ?  ? ?  ?Zamariya Neal, Gwenyth Allegra, MD ?09/25/21 1041 ? ?

## 2021-09-25 NOTE — ED Notes (Signed)
[employer] at patient bedside to transport patient to [facility]. Belongings given to officer. Paperwork checked by Garment/textile technologist.  ?

## 2021-09-25 NOTE — ED Notes (Signed)
Patient on TTS, refusing to answer. Refusing medications ?

## 2021-09-26 DIAGNOSIS — F200 Paranoid schizophrenia: Secondary | ICD-10-CM | POA: Diagnosis not present
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
## 2021-09-26 NOTE — Progress Notes (Signed)
Adult Psychoeducational Group Note ? ?Date:  09/26/2021 ?Time:  9:37 PM ? ?Group Topic/Focus:  ?Wrap-Up Group:   The focus of this group is to help patients review their daily goal of treatment and discuss progress on daily workbooks. ? ?Participation Level:  Did Not Attend ? ?Participation Quality:   Did Not Attend ? ?Affect:   Did Not Attend ? ?Cognitive:   Did Not Attend  ? ?Insight: None ? ?Engagement in Group:  Did Not Attend ? ?Modes of Intervention:   Did Not Attend ? ?Additional Comments:  Pt was encouraged to attend wrap up group but did not attend. ? ?[name] ?09/26/2021, 9:37 PM ?

## 2021-09-26 NOTE — Progress Notes (Signed)
Patient ID: Kaitlyn Duncan, female   DOB: 1971/04/30, 51 y.o.   MRN: [id number] ? ?Pt refused her scheduled morning medication. ?
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
## 2021-09-26 NOTE — Plan of Care (Signed)
?  Problem: Safety: ?Goal: Ability to remain free from injury will improve ?Outcome: Progressing ?  ?Problem: Education: ?Goal: Will be free of psychotic symptoms ?Outcome: Not Progressing ?  ?Problem: Education: ?Goal: Knowledge of the prescribed therapeutic regimen will improve ?Outcome: Not Progressing ?  ?Problem: Coping: ?Goal: Coping ability will improve ?Outcome: Not Progressing ?  ?Problem: Safety: ?Goal: Ability to redirect hostility and anger into socially appropriate behaviors will improve ?Outcome: Not Progressing ?  ?

## 2021-09-26 NOTE — Progress Notes (Signed)
?   09/26/21 2100  ?Psych Admission Type (Psych Patients Only)  ?Admission Status Involuntary  ?Psychosocial Assessment  ?Patient Complaints None  ?Eye Contact Brief  ?Facial Expression Sad  ?Affect Apprehensive  ?Speech Logical/coherent  ?Interaction Cautious  ?Motor Activity Slow  ?Appearance/Hygiene Unremarkable  ?Behavior Characteristics Guarded  ?Mood Suspicious;Apprehensive  ?Aggressive Behavior  ?Effect No apparent injury  ?Thought Process  ?Coherency Circumstantial  ?Content Blaming others  ?Delusions None reported or observed  ?Perception WDL  ?Hallucination None reported or observed  ?Judgment Limited  ?Confusion None  ?Danger to Self  ?Current suicidal ideation? Denies  ?Danger to Others  ?Danger to Others None reported or observed  ? ? ?

## 2021-09-26 NOTE — [facility] Suicide Risk Assessment (Signed)
Suicide Risk Assessment ? ?Admission Assessment    ?[facility] Admission Suicide Risk Assessment ? ? ?Nursing information obtained from:    ?Demographic factors:    ?Current Mental Status:    ?Loss Factors:    ?Historical Factors:    ?Risk Reduction Factors:    ? ?Total Time spent with patient:  ?I personally spent 30 minutes on the unit in direct patient care. The direct patient care time included face-to-face time with the patient, reviewing the patient's chart, communicating with other professionals, and coordinating care. Greater than 50% of this time was spent in counseling or coordinating care with the patient regarding goals of hospitalization, psycho-education, and discharge planning needs. ? ?Principal Problem: Schizophrenia ([place]) ?Diagnosis:  Principal Problem: ?  Schizophrenia ([place]) ? ?Subjective Data:  ?Kaitlyn Duncan is a 51 yr old female who presented to on 3/22 to [facility] under IVC with paranoia, hallucinations, and delusions.  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 ?  ?  ?Attempted to interview the patient multiple times today.  Finally she did answer respond but refused to answer any questions.  I was reading what has been documented prior to her arriving at the [facility] and she said that these were lies and untruths.  She stated the interviewer was not a psychiatrist because "you are too big, you are too loud, and you talk too much to be a psychiatrist."  She then states that I am a fraud and a loser and will burn in the like a fire in hell.  She then threatened to zip tie and hog tie the interviewer.  When attempting to tell her that threats to staff would not be tolerated she simply continued to say that I was a loser and a fake and a  Scallywag.  She then began to say things like they were Bible versus ending her statements with so sayeth  the Rudolph.  Encouraged patient to take her medication and ended the interview. ?  ?  ?Called patient's Legal Gwen Pounds, [phone].  She reports that patient stopped taking her medication as soon as she was discharged from the hospital in January.  She states that patient was better/manageable for about a month, however, after that she became uncontrollable and was kicked out of her hotel.  She reports that patient will refuse to see her ACT team and will not take medication for them either.  Discussed that given patient's need of forced medications last admission and desire for an LAI this would limit our options and recommended restarting Prolixin.  She was agreeable to this and stated that she had already been talking with patient's ACT team about an LAI and had already been planning to it have 1 administered to the patient prior to her admission.  She then asked if it would be possible for the hospital to help with housing since outpatient had been kicked out of the hotel.  Discussed that social work would be able to give her a call and would be more knowledgeable about the options that would exist for the patient.  She reports no other concerns at present. ? ?Continued Clinical Symptoms:  ?  ?The "Alcohol Use Disorders Identification Test", Guidelines for Use in Primary Care, Second Edition.  [place]  Organization (WHO). ?Score between 0-7:  no or low risk or alcohol related problems. ?Score between 8-15:  moderate risk of alcohol related problems. ?Score between 16-19:  high risk of alcohol related problems. ?Score 20 or above:  warrants further diagnostic evaluation for alcohol dependence and treatment. ? ? ?CLINICAL FACTORS:  ? Schizophrenia:   Paranoid or undifferentiated type ?Currently Psychotic ?Unstable or Poor Therapeutic Relationship ?Previous Psychiatric Diagnoses and  Treatments ? ? ?Musculoskeletal: ?Strength & Muscle Tone: within normal limits ?Gait & Station:  lay in bed during entire interview ?Patient leans: N/A ? ?Psychiatric Specialty Exam: ? ?Presentation  ?General Appearance: -- (lay in bed with sheet covering entire body during entire interview) ? ?Eye Contact:None ? ?Speech:-- (none unless insulting interviewer/condeming them to hell) ? ?Speech Volume:Normal ? ?Handedness:Right ? ? ?Mood and Affect  ?Mood:Irritable ? ?Affect:Restricted; Congruent ? ? ?Thought Process  ?Thought Processes:Disorganized ? ?Descriptions of Associations:Tangential ? ?Orientation:-- (Could not assess) ? ?Thought Content:Illogical; Delusions (hyper-religious) ? ?History of Schizophrenia/Schizoaffective disorder:Yes ? ?Duration of Psychotic Symptoms:Greater than six months ? ?Hallucinations:Hallucinations: -- (Could not assess) ? ?Ideas of Reference:Delusions; Paranoia; Percusatory ? ?Suicidal Thoughts:Suicidal Thoughts: -- (Could not assess) ? ?Homicidal Thoughts:Homicidal Thoughts: -- (Could not assess) ? ? ?Sensorium  ?Memory:Immediate Fair ? ?Judgment:Impaired ? ?Insight:None ? ? ?Executive Functions  ?Concentration:Poor ? ?Attention Span:Poor ? ?Recall:Poor ? ?Fund of Knowledge:Poor ? ?Language:Poor ? ? ?Psychomotor Activity  ?Psychomotor Activity:Psychomotor Activity: Normal ? ? ?Assets  ?Assets:Financial Resources/Insurance; Social Support (has a Production designer, theatre/television/film) ? ? ?Sleep  ?Sleep:Sleep: -- (Could not assess) ? ? ? ?Physical Exam: ?Physical Exam ?Vitals and nursing note reviewed.  ?Constitutional:   ?   General: She is not in acute distress. ?   Appearance: She is not ill-appearing or toxic-appearing.  ?HENT:  ?   Head: Normocephalic and atraumatic.  ?Pulmonary:  ?   Effort: Pulmonary effort is normal.  ?Neurological:  ?   Mental Status: She is alert.  ? ?Review of Systems  ?Unable to perform ROS: Psychiatric disorder  ?There were no vitals taken for this visit. There is no height  or weight on file to calculate BMI. ? ? ?COGNITIVE FEATURES THAT CONTRIBUTE TO RISK:  ?Loss of executive function and Thought constriction (tunnel vision)   ? ?SUICIDE RISK:  ? Cannot assess at this time but does poss a risk to herself because she cannot take care of herself. ? ?PLAN OF CARE:  ?Kaitlyn Duncan is a 51 yr old female who presented to on 3/22 to [facility] under IVC with paranoia, hallucinations, and delusions.  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 ?  ?We will attempt to stabilize her with Prolixin and if successful will start LAI.  Last time she required Forced Medications so will see if she takes her medications, however, if she does not we will have Dr. Mamie Levers see her tomorrow for a Second Opinion. ?  ?Unspecified schizophrenia spectrum and other psychotic d/o (r/o psychosis secondary to general medical condition - metatstatic melanoma: ?-Continue Prolixin 10 mg BID for psychosis ?-Continue Cogentin 0.5 mg BID for antipsychotic side effects ?-Continue Agitation Protocol: Prolixin 2.5 mg PRN BID ?  ?-Continue PRN's: Tylenol, Maalox, Atarax, Milk of Magnesia, Trazodone ? ?I certify that inpatient services furnished can reasonably  be expected to improve the patient's condition.  ? ?Briant Cedar, MD ?09/26/2021, 5:53 PM ?

## 2021-09-26 NOTE — Progress Notes (Signed)
?   09/26/21 1301  ?Psych Admission Type (Psych Patients Only)  ?Admission Status Involuntary  ?Psychosocial Assessment  ?Patient Complaints None  ?Eye Contact Brief  ?Facial Expression Pensive  ?Affect Apprehensive  ?Speech Logical/coherent  ?Interaction No initiation  ?Motor Activity Slow  ?Appearance/Hygiene Unremarkable  ?Behavior Characteristics Guarded  ?Mood Apprehensive;Suspicious;Apathetic  ?Thought Process  ?Coherency WDL  ?Content Blaming others  ?Delusions None reported or observed  ?Perception WDL  ?Hallucination None reported or observed  ?Judgment Limited  ?Confusion None  ?Danger to Self  ?Current suicidal ideation? Denies  ?Danger to Others  ?Danger to Others None reported or observed  ? ? ?

## 2021-09-26 NOTE — [facility] Group Notes (Signed)
Clinical Social Work Note ? ?Group was not held on this unit because the dayroom was closed per administration instructions. ? ?Selmer Dominion, LCSW ?09/26/2021, 1:45 PM ?  ?

## 2021-09-27 DIAGNOSIS — F200 Paranoid schizophrenia: Secondary | ICD-10-CM | POA: Diagnosis not present

## 2021-09-27 MED ORDER — FLUPHENAZINE HCL 2.5 MG/ML IJ SOLN: 2.5000 mg | Freq: Three times a day (TID) | INTRAMUSCULAR | Status: DC | PRN

## 2021-09-27 MED ORDER — FLUPHENAZINE HCL 2.5 MG/ML IJ SOLN
5.0000 mg | Freq: Three times a day (TID) | INTRAMUSCULAR | Status: DC
  Filled 2021-09-27 (×3): qty 2

## 2021-09-27 MED ORDER — FLUPHENAZINE HCL 2.5 MG/ML IJ SOLN
5.0000 mg | Freq: Three times a day (TID) | INTRAMUSCULAR | Status: DC
  Administered 2021-09-27 – 2021-09-29 (×5): 5 mg via INTRAMUSCULAR
  Filled 2021-09-27 (×2): qty 2
  Filled 2021-09-27: qty 10
  Filled 2021-09-27 (×7): qty 2

## 2021-09-27 MED ORDER — FLUPHENAZINE HCL 10 MG PO TABS
10.0000 mg | ORAL_TABLET | Freq: Three times a day (TID) | ORAL | Status: DC
  Filled 2021-09-27 (×3): qty 1

## 2021-09-27 MED ORDER — BENZTROPINE MESYLATE 0.5 MG PO TABS
0.5000 mg | ORAL_TABLET | Freq: Three times a day (TID) | ORAL | Status: DC
  Filled 2021-09-27 (×5): qty 1

## 2021-09-27 MED ORDER — FLUPHENAZINE HCL 10 MG PO TABS
10.0000 mg | ORAL_TABLET | Freq: Three times a day (TID) | ORAL | Status: DC
  Filled 2021-09-27 (×9): qty 1

## 2021-09-27 NOTE — Progress Notes (Signed)
[facility] MD Progress Note ? ?09/27/2021 12:31 PM ?Kaitlyn Duncan  ?MRN:  [id number] ?Subjective:   ?Kaitlyn Duncan is a 51 yr old female who presented to on 3/22 to [facility] under IVC with paranoia, hallucinations, and delusions.  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 ? ? ?Case was discussed in the multidisciplinary team. MAR was reviewed and patient was not compliant with medications.  She refused all medications yesterday. ? ? ?Psychiatric Team made the following recommendations yesterday: ?-Continue Prolixin 10 mg BID for psychosis ?-Continue Cogentin 0.5 mg BID for antipsychotic side effects ?-Continue Agitation Protocol: Prolixin 2.5 mg PRN BID ? ? ?Attempted to interview the patient again today.  Throughout the interview she would chant "Degesibah degesibah degesibah."  Discussed with her that I would recommend she take her medication and at this she stopped chanting and stated "you cannot recommend anything to her you are a fraud and will burn in the lake of fire sooner rather than later."  When further attempting to discuss things with her she then threatened to pull my hair since I would not listen.  Discussed with her that threatening staff will not be tolerated and she stated "you are not staff you are a fraud and a liar."  She then stated "I am the 3 in 1 tri-am god and she does not need your medicine."  Attempted to explain I recommended she take medications and she stated "you are a fraud, a Scallywag, a whore, a warlock." She then began chanting again "Degesibah degesibah degesibah." ? ?Later Dr. Mamie Levers interviewed her for a Second Opinion for Forced Medications Over Objections.  Afterwards I again offered  her oral medication but she continued to chant "Degesibah degesibah degesibah." ? ? ?Principal Problem: Schizophrenia ([place]) ?Diagnosis: Principal Problem: ?  Schizophrenia ([place]) ? ?Total Time spent with patient:  ?I personally spent 20 minutes on the unit in direct patient care. The direct patient care time included face-to-face time with the patient, reviewing the patient's chart, communicating with other professionals, and coordinating care. Greater than 50% of this time was spent in counseling or coordinating care with the patient regarding goals of hospitalization, psycho-education, and discharge planning needs. ? ? 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 ? ?Past Medical History:  ?Past Medical History:  ?Diagnosis Date  ? Allergy   ? Anemia   ? Gallstones   ? GERD (gastroesophageal reflux disease)   ? Hemorrhoids   ? melanoma with met dz dx'd 01/2018  ? brain and adrenal  ? Seasonal allergies   ?  ?Past Surgical History:  ?Procedure Laterality Date  ? BIOPSY  01/28/2018  ? Procedure: BIOPSY;  Surgeon: Laurence Spates, MD;  Location: [facility];  Service: Endoscopy;;  ? BREAST BIOPSY Left   ? ESOPHAGOGASTRODUODENOSCOPY N/A 01/28/2018  ? Procedure: ESOPHAGOGASTRODUODENOSCOPY (EGD);  Surgeon: Laurence Spates, MD;  Location: [facility];  Service: Endoscopy;  Laterality: N/A;  ? MOUTH SURGERY    ? OVARY SURGERY    ?  removed "something"  ? ?Family History:  ?Family History  ?Problem Relation Age of Onset  ? Melanoma Mother   ? Hypertension Father   ? Breast cancer Maternal Aunt   ? Breast cancer Paternal Aunt   ? Diabetes Paternal Aunt   ? Breast cancer Maternal Grandmother   ? Breast cancer Paternal  Grandmother   ? Diabetes Paternal Grandmother   ? Colon cancer Neg Hx   ? Esophageal cancer Neg Hx   ? Pancreatic cancer Neg Hx   ? Stomach cancer Neg Hx   ? Liver disease Neg Hx   ? Rectal cancer Neg Hx   ? ?Family Psychiatric  History: Could not assess ?Social History:  ?Social History  ? ?Substance and Sexual Activity  ?Alcohol Use No  ?   ?Social History  ? ?Substance and Sexual Activity  ?Drug Use No  ?  ?Social History  ? ?Socioeconomic History  ? Marital status: Single  ?  Spouse name: Not on file  ? Number of children: Not on file  ? Years of education: Not on file  ? Highest education level: Not on file  ?Occupational History  ? Not on file  ?Tobacco Use  ? Smoking status: Never  ? Smokeless tobacco: Never  ?Vaping Use  ? Vaping Use: Never used  ?Substance and Sexual Activity  ? Alcohol use: No  ? Drug use: No  ? Sexual activity: Not Currently  ?Other Topics Concern  ? Not on file  ?Social History Narrative  ? Not on file  ? ?Social Determinants of Health  ? ?Financial Resource Strain: Not on file  ?Food Insecurity: Not on file  ?Transportation Needs: Not on file  ?Physical Activity: Not on file  ?Stress: Not on file  ?Social Connections: Not on file  ? ?Additional Social History:  ?  ?  ?  ?  ?  ?  ?  ?  ?  ?  ?  ? ?Sleep:  Could not assess ? ?Appetite:   Could not assess ? ?Current Medications: ?Current Facility-Administered Medications  ?Medication Dose Route Frequency Provider Last Rate Last Admin  ? acetaminophen (TYLENOL) tablet 650 mg  650 mg Oral Q6H PRN White, Patrice L, NP      ? alum & mag hydroxide-simeth (MAALOX/MYLANTA) 200-200-20 MG/5ML suspension 30 mL  30 mL Oral Q4H PRN White, Patrice L, NP      ? benztropine (COGENTIN) tablet 0.5 mg  0.5 mg Oral TID Briant Cedar, MD      ? fluPHENAZine (PROLIXIN) injection 2.5 mg  2.5 mg Intramuscular Q8H PRN Briant Cedar, MD      ? fluPHENAZine (PROLIXIN) tablet 10 mg  10 mg Oral TID Briant Cedar, MD      ? Or  ? fluPHENAZine  (PROLIXIN) injection 5 mg  5 mg Intramuscular TID Briant Cedar, MD   5 mg at 09/27/21 1044  ? hydrOXYzine (ATARAX) tablet 25 mg  25 mg Oral TID PRN White, Patrice L, NP      ? magnesium hydroxide (MILK OF MAGNESIA) suspension 30 mL  30 mL Oral Daily PRN White, Patrice L, NP      ? ? ?Lab Results: No results found for this or any previous visit (from the past 48 hour(s)). ? ?Blood Alcohol level:  ?Lab Results  ?Component Value Date  ? ETH <10 09/23/2021  ? ETH <10 06/25/2021  ? ? ?Metabolic Disorder Labs: ?Lab Results  ?Component Value Date  ? HGBA1C 5.1 06/28/2021  ?  MPG 99.67 06/28/2021  ? MPG 119.76 02/03/2018  ? ?No results found for: PROLACTIN ?Lab Results  ?Component Value Date  ? CHOL 212 (H) 06/28/2021  ? TRIG 56 06/28/2021  ? HDL 63 06/28/2021  ? CHOLHDL 3.4 06/28/2021  ? VLDL 11 06/28/2021  ? LDLCALC 138 (H) 06/28/2021  ? [place] 91 02/03/2018  ? ? ?Physical Findings: ?AIMS:  , ,  ,  ,    ?CIWA:    ?COWS:    ? ?Musculoskeletal: ?Strength & Muscle Tone: within normal limits ?Gait & Station: normal ?Patient leans: N/A ? ?Psychiatric Specialty Exam: ? ?Presentation  ?General Appearance: Bizarre; Disheveled (had a blanket wrapped around her self while sitting on the bed side bench) ? ?Eye Contact:Minimal ? ?Speech:-- (chanting "degesibah" or insulting interviewer) ? ?Speech Volume:Normal ? ?Handedness:Right ? ? ?Mood and Affect  ?Mood:Irritable ? ?Affect:Congruent; Restricted ? ? ?Thought Process  ?Thought Processes:Disorganized ? ?Descriptions of Associations:Tangential ? ?Orientation:-- (Could not assess) ? ?Thought Content:Illogical; Delusions (hyper-religious) ? ?History of Schizophrenia/Schizoaffective disorder:Yes ? ?Duration of Psychotic Symptoms:Greater than six months ? ?Hallucinations:Hallucinations: -- (Could not assess) ? ?Ideas of Reference:Delusions; Paranoia; Percusatory ? ?Suicidal Thoughts:Suicidal Thoughts: -- (Could not assess) ? ?Homicidal Thoughts:Homicidal Thoughts: -- (Could not  assess) ? ? ?Sensorium  ?Memory:Immediate Fair ? ?Judgment:Impaired ? ?Insight:None ? ? ?Executive Functions  ?Concentration:Poor ? ?Attention Span:Poor ? ?Recall:Poor ? ?Fund of Knowledge:Poor ? ?Al Corpus

## 2021-09-27 NOTE — Progress Notes (Signed)
[facility] Second Physician Opinion Progress Note for Medication Administration to Non-consenting Patients (For Involuntarily Committed Patients) ? ?Patient: Kaitlyn Duncan ?Date of Birth: [phone] ?MRN: [id number] ? ? ? ?Reason for the Medication: The patient, without the benefit of the specific treatment measure, is incapable of participating in any available treatment plan that will give the patient a realistic opportunity of improving the patient's condition. ?  ?Consideration of Side Effects: Consideration of the side effects related to the medication plan has been given. ?  ?Rationale for Medication Administration: Patient has shown improvement in past hospitalizations with medication management.  He decompensates after discharge when he becomes medication compliant.  ?  ? ?At the present time, patient has no capacity to understand the need for treatment, refusing to take psychotropic medication, because of poor insight and judgment.  Patient states that they have no problem, no mental illness and refuses to consider taking medication, recommended by the psychiatrist Dr. Cathey Endow. ? ?Diagnosis: Schizophrenia ?  ?Treatment plan/Opinion: ?-patient is mentally ill, as needs hospitalization ?-patient continues to display active symptoms of psychosis ?-patient lacks the capacity to consent to treatment with medication   ?-patient is unable to rationally discuss medication options, risks versus benefit due to their symptoms. ?-treatment with medication and medication changes would be in their best interest ?-psychotropics are effective treatment for symptoms of psychosis ?  ?Based on my evaluation, the following medications are recommend and indicated for treatment of the patient's psychiatric diagnosis and symptoms: ?-Antipsychotic medication (first or second generation)  ? ? ?Christoper Allegra, MD ?09/27/21  10:06 AM ? ? ?This documentation is good for (7) seven days from the date of the MD signature. New  documentation must be completed every seven (7) days with detailed justification in the medical record if the patient requires continued non-emergent administration of psychotropic medications. ?

## 2021-09-27 NOTE — B[facility] Group Notes (Signed)
Clinical Social Work Note ? ?Per administration, unit day room is closed and patients are on restriction until they test negative for illness.  Therefore, it was not possible to do group. ? ?Selmer Dominion, LCSW ?09/27/2021, 11:59 AM ?

## 2021-09-27 NOTE — Progress Notes (Signed)
?   09/27/21 1149  ?Psych Admission Type (Psych Patients Only)  ?Admission Status Involuntary  ?Psychosocial Assessment  ?Patient Complaints None  ?Eye Contact Brief  ?Facial Expression Pensive  ?Affect Apprehensive  ?Speech Abusive;Loud;Argumentative  ?Interaction No initiation  ?Motor Activity Slow  ?Appearance/Hygiene Unremarkable  ?Behavior Characteristics Irritable;Resistant to care;Agitated  ?Thought Process  ?Coherency Circumstantial;Concrete thinking  ?Content Blaming others  ?Delusions None reported or observed  ?Perception WDL  ?Hallucination None reported or observed  ?Judgment Limited  ?Confusion None  ?Danger to Self  ?Current suicidal ideation? Denies  ?Danger to Others  ?Danger to Others None reported or observed  ? ? ?

## 2021-09-27 NOTE — [facility] Group Notes (Signed)
Nursing Group Note ? ? ?Per administration, unit day room is closed and patients are on restriction until they test negative for illness.  Therefore, it was not possible to do group. ?

## 2021-09-27 NOTE — [facility] Group Notes (Signed)
Nursing Group: ? ?Per Administration, unit dayroom is closed and patients are on restriction until they test negative for illness. Therefore, it was not possible to do groups ?

## 2021-09-27 NOTE — Progress Notes (Signed)
Writer entered patients room and introduced self to her. She lifted the covers off her head and looked at Probation officer and responded that she did not give me permission to enter. Writer informed her that I needed to speak to and see all my patients and inform them of medications and see if she is in need of anything. She proceeded to cover her head up and refused to verbally respond or acknowledge me talking to her. Safety maintained with 15 min checks.  ?

## 2021-09-27 NOTE — Progress Notes (Signed)
?   09/27/21 0500  ?Sleep  ?Number of Hours 5.75  ? ? ?

## 2021-09-27 NOTE — Plan of Care (Signed)
?  Problem: Nutritional: ?Goal: Ability to achieve adequate nutritional intake will improve ?Outcome: Progressing ?  ?Problem: Safety: ?Goal: Ability to remain free from injury will improve ?Outcome: Progressing ?  ?Problem: Self-Care: ?Goal: Ability to participate in self-care as condition permits will improve ?Outcome: Progressing ?  ?

## 2021-09-28 ENCOUNTER — Encounter (HOSPITAL_COMMUNITY): Payer: Self-pay

## 2021-09-28 DIAGNOSIS — F200 Paranoid schizophrenia: Secondary | ICD-10-CM | POA: Diagnosis not present

## 2021-09-28 MED ORDER — BENZTROPINE MESYLATE 0.5 MG PO TABS
0.5000 mg | ORAL_TABLET | Freq: Three times a day (TID) | ORAL | Status: DC
Start: 2021-09-28 — End: 2021-09-29
  Filled 2021-09-28 (×6): qty 1

## 2021-09-28 MED ORDER — BENZTROPINE MESYLATE 1 MG/ML IJ SOLN
0.5000 mg | Freq: Three times a day (TID) | INTRAMUSCULAR | Status: DC
  Administered 2021-09-28 – 2021-09-29 (×3): 0.5 mg via INTRAMUSCULAR
  Filled 2021-09-28 (×6): qty 0.5
  Filled 2021-09-28: qty 2

## 2021-09-28 NOTE — Group Note (Signed)
Recreation Therapy Group Note ? ? ?Group Topic:Goal Setting  ?Group Date: 09/28/2021 ?Start Time: 1000 ?End Time: 1045 ?Facilitators: Victorino Sparrow, LRT,CTRS ?Location: [place] ? ? ?Goal Area(s) Addresses:  ?Patient will participate in discussion of what a goal is. ?Patient will identify what goals to complete in allotted times. ? ?Group Description:  Group started with a discussion about goals. Patients were then given a sheet to identify goals they wish to accomplish in a week, month, year and five years.  Patients would then identify what would prohibit them from reaching those goals, what would be needed to reach goals and what could they start doing now to work towards goals.  ? ? ?Participation Level: Did not occur ?  ? ?Clinical Observations/Individualized Feedback:  Due to illness on the unit, group did not occur at usual time.  Patients were given packets on mindfulness.  Packets discussed what mindfulness means, observing current thoughts and occasions when being mindful.  Patients were to also identify their strengths/qualities and what they want out of life and what's preventing it.  ? ? ?Plan: Continue to engage patient in RT group sessions 2-3x/week. ? ? ?Victorino Sparrow, LRT,CTRS  ?09/28/2021 12:54 PM ?
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
## 2021-09-28 NOTE — Progress Notes (Addendum)
[facility] MD Progress Note ? ?09/28/2021 1:19 PM ?Kaitlyn Duncan  ?MRN:  [id number] ?Subjective:  Kaitlyn Duncan is a 51 yr old female who presented to on 3/22 to [facility] under IVC with paranoia, hallucinations, and delusions.  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 ?  ?  ?Case was discussed in the multidisciplinary team. MAR was reviewed and patient was not compliant with medications.  She refused all medications yesterday and only received one of her mandatory IM prolixins.  ?  ?  ?Psychiatric Team made the following recommendations yesterday: ?-Continue Prolixin 10 mg BID for psychosis ?-Continue Cogentin 0.5 mg BID for antipsychotic side effects ?-Continue Agitation Protocol: Prolixin 2.5 mg PRN BID ?  ? ?On assessment this AM patient is loud, irritable and refusing to answer most questions by either RN or MD. Patient reports that she is upset because "no one is allowed to come in" and is waiting for the "caucasian doctor" but does not recall the name even when given names. Patient does mention it is a female. Patient does not recall seeing this writer multiple times during her last hospitalization. When patient is not allowed to retreated to her bathroom and Probation officer and RN stayed in her room when she covered her head, she got up and went to tell the tech that were were violating her rights by being in the room. Patient refused to answer any further safety questions. ? ?Despite patient only denying SI, she does not appear to be responding to internal or external stimuli nor does she appear homicidal . She appears to be delusional as noted during her last hospitalization but to the point of refusing meds and staff reports  patient is not eating or sleeping much either.  ?Principal Problem: Schizophrenia ([place]) ?Diagnosis: Principal Problem: ?  Schizophrenia ([place]) ? ?Total Time spent with patient: 20 minutes ? ?Past Psychiatric History: See H&P ? ?Past Medical History:  ?Past Medical History:  ?Diagnosis Date  ? Allergy   ? Anemia   ? Gallstones   ? GERD (gastroesophageal reflux disease)   ? Hemorrhoids   ? melanoma with met dz dx'd 01/2018  ? brain and adrenal  ? Seasonal allergies   ?  ?Past Surgical History:  ?Procedure Laterality Date  ? BIOPSY  01/28/2018  ? Procedure: BIOPSY;  Surgeon: Laurence Spates, MD;  Location: [facility]COPY;  Service: Endoscopy;;  ? BREAST BIOPSY Left   ? ESOPHAGOGASTRODUODENOSCOPY N/A 01/28/2018  ? Procedure: ESOPHAGOGASTRODUODENOSCOPY (EGD);  Surgeon: Laurence Spates, MD;  Location: [facility];  Service: Endoscopy;  Laterality: N/A;  ? MOUTH SURGERY    ? OVARY SURGERY    ? removed "something"  ? ?Family History:  ?Family History  ?Problem Relation Age of Onset  ? Melanoma Mother   ? Hypertension Father   ? Breast cancer Maternal Aunt   ? Breast cancer Paternal Aunt   ? Diabetes Paternal Aunt   ? Breast cancer Maternal Grandmother   ? Breast cancer Paternal Grandmother   ? Diabetes Paternal Grandmother   ? Colon cancer Neg Hx   ? Esophageal cancer Neg Hx   ? Pancreatic cancer Neg Hx   ? Stomach cancer Neg Hx   ? Liver disease Neg Hx   ? Rectal cancer  Neg Hx   ? ?Family Psychiatric  History:  See H&P ?Social History:  ?Social History  ? ?Substance and Sexual Activity  ?Alcohol Use No  ?   ?Social History  ? ?Substance and Sexual Activity  ?Drug Use No  ?  ?Social History  ? ?Socioeconomic History  ? Marital status: Single  ?  Spouse name: Not on file  ? Number of children: Not on file  ? Years of education: Not on file  ? Highest education level: Not on file  ?Occupational History  ? Not on file  ?Tobacco Use  ? Smoking status: Never  ? Smokeless tobacco: Never  ?Vaping Use  ? Vaping Use: Never used  ?Substance  and Sexual Activity  ? Alcohol use: No  ? Drug use: No  ? Sexual activity: Not Currently  ?Other Topics Concern  ? Not on file  ?Social History Narrative  ? Not on file  ? ?Social Determinants of Health  ? ?Financial Resource Strain: Not on file  ?Food Insecurity: Not on file  ?Transportation Needs: Not on file  ?Physical Activity: Not on file  ?Stress: Not on file  ?Social Connections: Not on file  ? ?Additional Social History:  ?  ?  ?  ?  ?  ?  ?  ?  ?  ?  ?  ? ?Sleep: Poor ? ?Appetite:  Poor ? ?Current Medications: ?Current Facility-Administered Medications  ?Medication Dose Route Frequency Provider Last Rate Last Admin  ? acetaminophen (TYLENOL) tablet 650 mg  650 mg Oral Q6H PRN White, Patrice L, NP      ? alum & mag hydroxide-simeth (MAALOX/MYLANTA) 200-200-20 MG/5ML suspension 30 mL  30 mL Oral Q4H PRN White, Patrice L, NP      ? benztropine (COGENTIN) tablet 0.5 mg  0.5 mg Oral TID Briant Cedar, MD      ? fluPHENAZine (PROLIXIN) injection 2.5 mg  2.5 mg Intramuscular Q8H PRN Briant Cedar, MD      ? fluPHENAZine (PROLIXIN) tablet 10 mg  10 mg Oral TID Briant Cedar, MD      ? Or  ? fluPHENAZine (PROLIXIN) injection 5 mg  5 mg Intramuscular TID Briant Cedar, MD   5 mg at 09/28/21 1005  ? hydrOXYzine (ATARAX) tablet 25 mg  25 mg Oral TID PRN White, Patrice L, NP      ? magnesium hydroxide (MILK OF MAGNESIA) suspension 30 mL  30 mL Oral Daily PRN White, Patrice L, NP      ? ? ?Lab Results: No results found for this or any previous visit (from the past 48 hour(s)). ? ?Blood Alcohol level:  ?Lab Results  ?Component Value Date  ? ETH <10 09/23/2021  ? ETH <10 06/25/2021  ? ? ?Metabolic Disorder Labs: ?Lab Results  ?Component Value Date  ? HGBA1C 5.1 06/28/2021  ? MPG 99.67 06/28/2021  ? MPG 119.76 02/03/2018  ? ?No results found for: PROLACTIN ?Lab Results  ?Component Value Date  ? CHOL 212 (H) 06/28/2021  ? TRIG 56 06/28/2021  ? HDL 63 06/28/2021  ? CHOLHDL 3.4 06/28/2021  ?  VLDL 11 06/28/2021  ? LDLCALC 138 (H) 06/28/2021  ? [place] 91 02/03/2018  ? ? ?Physical Findings: ?AIMS:  , ,  ,  ,    ?CIWA:    ?COWS:    ? ?Musculoskeletal: ?Strength & Muscle Tone: within normal limits ?Gait & Station: normal ?Patient leans: N/A ? ?Psychiatric Specialty Exam: ? ?Presentation  ?General Appearance: -- (  hair is unkempt, patient has scabbing and bruises on her upper and lower lip, refuses to wear socks or shoes when she is up from the bed) ? ?Eye Contact:Poor (avoids, until she gets very upset) ? ?Speech:Clear and Coherent ? ?Speech Volume:Increased ? ?Handedness:Right ? ? ?Mood and Affect  ?Mood:Irritable ? ?Affect:Congruent; Restricted ? ? ?Thought Process  ?Thought Processes:Linear ? ?Descriptions of Associations:Loose ? ?Orientation:-- (unknown) ? ?Thought Content:Illogical; Perseveration ? ?History of Schizophrenia/Schizoaffective disorder:Yes ? ?Duration of Psychotic Symptoms:Greater than six months ? ?Hallucinations:Hallucinations: -- (unknown, refuses to answer) ? ?Ideas of Reference:Delusions; Paranoia; Percusatory ? ?Suicidal Thoughts:Suicidal Thoughts: No ? ?Homicidal Thoughts:Homicidal Thoughts: -- (wont answer, instead says "Are you?") ? ? ?Sensorium  ?Memory:Immediate Poor; Recent Poor ? ?Judgment:Impaired ? ?Insight:None ? ? ?Executive Functions  ?Concentration:Poor ? ?Attention Span:Poor ? ?Recall:Poor ? ?Fund of Knowledge:Poor ? ?Language:Fair ? ? ?Psychomotor Activity  ?Psychomotor Activity:Psychomotor Activity: Normal ? ? ?Assets  ?Assets:Resilience ? ? ?Sleep  ?Sleep:Sleep: Poor ?Number of Hours of Sleep: 5.75 ? ? ? ?Physical Exam: ?Physical Exam ?HENT:  ?   Head: Normocephalic and atraumatic.  ?Pulmonary:  ?   Effort: Pulmonary effort is normal.  ?Neurological:  ?   Mental Status: She is alert.  ? ?Review of Systems  ?Psychiatric/Behavioral:  Negative for suicidal ideas.   ?There were no vitals taken for this visit. There is no height or weight on file to calculate  BMI. ? ? ?Treatment Plan Summary: ?Daily contact with patient to assess and evaluate symptoms and progress in treatment and Medication management ?Kaitlyn Duncan is a 51 yr old female who presented to on 3/
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
## 2021-09-28 NOTE — BH IP Treatment Plan (Signed)
Interdisciplinary Treatment and Diagnostic Plan Update ? ?09/28/2021 ?Time of Session: 10:50am ?Kaitlyn Duncan ?MRN: [id number] ? ?Principal Diagnosis: Schizophrenia ([place]) ? ?Secondary Diagnoses: Principal Problem: ?  Schizophrenia ([place]) ? ? ?Current Medications:  ?Current Facility-Administered Medications  ?Medication Dose Route Frequency Provider Last Rate Last Admin  ? acetaminophen (TYLENOL) tablet 650 mg  650 mg Oral Q6H PRN White, Patrice L, NP      ? alum & mag hydroxide-simeth (MAALOX/MYLANTA) 200-200-20 MG/5ML suspension 30 mL  30 mL Oral Q4H PRN White, Patrice L, NP      ? benztropine (COGENTIN) tablet 0.5 mg  0.5 mg Oral TID Briant Cedar, MD      ? fluPHENAZine (PROLIXIN) injection 2.5 mg  2.5 mg Intramuscular Q8H PRN Briant Cedar, MD      ? fluPHENAZine (PROLIXIN) tablet 10 mg  10 mg Oral TID Briant Cedar, MD      ? Or  ? fluPHENAZine (PROLIXIN) injection 5 mg  5 mg Intramuscular TID Briant Cedar, MD   5 mg at 09/27/21 1044  ? hydrOXYzine (ATARAX) tablet 25 mg  25 mg Oral TID PRN White, Patrice L, NP      ? magnesium hydroxide (MILK OF MAGNESIA) suspension 30 mL  30 mL Oral Daily PRN White, Patrice L, NP      ? ?PTA Medications: ?No medications prior to admission.  ? ? ?Patient Stressors:   ? ?Patient Strengths:   ? ?Treatment Modalities: Medication Management, Group therapy, Case management,  ?1 to 1 session with clinician, Psychoeducation, Recreational therapy. ? ? ?Physician Treatment Plan for Primary Diagnosis: Schizophrenia ([place]) ?Long Term Goal(s): Improvement in symptoms so as ready for discharge  ? ?Short Term Goals: Ability to identify changes in lifestyle to reduce recurrence of condition will improve ?Ability to verbalize feelings will improve ?Ability to demonstrate self-control will improve ?Ability to identify and develop effective coping behaviors will improve ?Ability to maintain clinical measurements within normal limits will improve ?Compliance  with prescribed medications will improve ?Ability to identify triggers associated with substance abuse/mental health issues will improve ? ?Medication Management: Evaluate patient's response, side effects, and tolerance of medication regimen. ? ?Therapeutic Interventions: 1 to 1 sessions, Unit Group sessions and Medication administration. ? ?Evaluation of Outcomes: Not Met ? ?Physician Treatment Plan for Secondary Diagnosis: Principal Problem: ?  Schizophrenia ([place]) ? ?Long Term Goal(s): Improvement in symptoms so as ready for discharge  ? ?Short Term Goals: Ability to identify changes in lifestyle to reduce recurrence of condition will improve ?Ability to verbalize feelings will improve ?Ability to demonstrate self-control will improve ?Ability to identify and develop effective coping behaviors will improve ?Ability to maintain clinical measurements within normal limits will improve ?Compliance with prescribed medications will improve ?Ability to identify triggers associated with substance abuse/mental health issues will improve    ? ?Medication Management: Evaluate patient's response, side effects, and tolerance of medication regimen. ? ?Therapeutic Interventions: 1 to 1 sessions, Unit Group sessions and Medication administration. ? ?Evaluation of Outcomes: Not Met ? ? ?RN Treatment Plan for Primary Diagnosis: Schizophrenia ([place]) ?Long Term Goal(s): Knowledge of disease and therapeutic regimen to maintain health will improve ? ?Short Term Goals: Ability to remain free from injury will improve, Ability to verbalize frustration and anger appropriately will improve, Ability to demonstrate self-control, Ability to participate in decision making will improve, Ability to verbalize feelings will improve, Ability to disclose and discuss suicidal ideas, Ability to identify and develop effective coping behaviors will improve, and Compliance  with prescribed medications will improve ? ?Medication Management: RN will  administer medications as ordered by provider, will assess and evaluate patient's response and provide education to patient for prescribed medication. RN will report any adverse and/or side effects to prescribing provider. ? ?Therapeutic Interventions: 1 on 1 counseling sessions, Psychoeducation, Medication administration, Evaluate responses to treatment, Monitor vital signs and CBGs as ordered, Perform/monitor CIWA, COWS, AIMS and Fall Risk screenings as ordered, Perform wound care treatments as ordered. ? ?Evaluation of Outcomes: Not Met ? ? ?LCSW Treatment Plan for Primary Diagnosis: Schizophrenia ([place]) ?Long Term Goal(s): Safe transition to appropriate next level of care at discharge, Engage patient in therapeutic group addressing interpersonal concerns. ? ?Short Term Goals: Engage patient in aftercare planning with referrals and resources, Increase social support, Increase ability to appropriately verbalize feelings, Increase emotional regulation, Facilitate acceptance of mental health diagnosis and concerns, and Identify triggers associated with mental health/substance abuse issues ? ?Therapeutic Interventions: Assess for all discharge needs, 1 to 1 time with Education officer, museum, Explore available resources and support systems, Assess for adequacy in community support network, Educate family and significant other(s) on suicide prevention, Complete Psychosocial Assessment, Interpersonal group therapy. ? ?Evaluation of Outcomes: Not Met ? ? ?Progress in Treatment: ?Attending groups: No. ?Participating in groups: No. ?Taking medication as prescribed: No. ?Toleration medication: Yes. ?Family/Significant other contact made: No, will contact:  legal guardian ?Patient understands diagnosis: No. ?Discussing patient identified problems/goals with staff: Yes. ?Medical problems stabilized or resolved: Yes. ?Denies suicidal/homicidal ideation: Yes. ?Issues/concerns per patient self-inventory: No. ? ? ?New problem(s) identified:  No, Describe:  none ? ?New Short Term/Long Term Goal(s): medication stabilization, elimination of SI thoughts, development of comprehensive mental wellness plan.  ? ? ?Patient Goals:  "To get out" ? ?Discharge Plan or Barriers: Patient is currently receiving ACTT services through [facility].  ? ?Reason for Continuation of Hospitalization: Aggression ?Delusions  ?Hallucinations ?Medication stabilization ? ?Estimated Length of Stay: 3-5 days ? ? ?Scribe for Treatment Team: ?Vassie Moselle, LCSW ?09/28/2021 ?12:16 PM ?

## 2021-09-28 NOTE — [facility] Counselor (Signed)
CSW spoke with [facility] ACT Team Lead Lars Masson who has requested this patient be given the longest lasting LAI and to also have an outpatient commitment in place before this patient discharges.  ? ? ? ?Darletta Moll MSW, LCSW ?Clincal Social Worker  ?[facility]  ?

## 2021-09-28 NOTE — Progress Notes (Signed)
?   09/28/21 2126  ?Psych Admission Type (Psych Patients Only)  ?Admission Status Involuntary  ?Psychosocial Assessment  ?Patient Complaints Agitation  ?Eye Contact Avoids  ?Facial Expression Angry  ?Affect Blunted  ?Speech Pressured;Argumentative  ?Interaction Defensive  ?Motor Activity Slow;Unsteady  ?Appearance/Hygiene Unremarkable  ?Behavior Characteristics Cooperative  ?Mood Labile;Suspicious  ?Aggressive Behavior  ?Effect No apparent injury  ?Thought Process  ?Coherency Tangential;Disorganized  ?Content Preoccupation;Blaming others  ?Delusions Religious  ?Perception Derealization  ?Hallucination None reported or observed  ?Judgment Poor  ?Confusion Moderate  ?Danger to Self  ?Current suicidal ideation? Denies  ?Danger to Others  ?Danger to Others None reported or observed  ? ? ?

## 2021-09-29 DIAGNOSIS — F209 Schizophrenia, unspecified: Principal | ICD-10-CM

## 2021-09-29 MED ORDER — TRAZODONE HCL 50 MG PO TABS
50.0000 mg | ORAL_TABLET | Freq: Every evening | ORAL | Status: DC | PRN
Start: 2021-09-29 — End: 2021-10-12

## 2021-09-29 MED ORDER — FLUPHENAZINE HCL 2.5 MG/ML IJ SOLN
2.5000 mg | Freq: Two times a day (BID) | INTRAMUSCULAR | Status: DC
  Administered 2021-09-29 – 2021-09-30 (×2): 2.5 mg via INTRAMUSCULAR
  Filled 2021-09-29 (×4): qty 1

## 2021-09-29 MED ORDER — FLUPHENAZINE HCL 5 MG PO TABS
5.0000 mg | ORAL_TABLET | Freq: Every day | ORAL | Status: DC
  Filled 2021-09-29: qty 1

## 2021-09-29 MED ORDER — OLANZAPINE 5 MG PO TABS: 5.0000 mg | ORAL_TABLET | Freq: Three times a day (TID) | ORAL | Status: DC | PRN

## 2021-09-29 MED ORDER — OLANZAPINE 5 MG PO TABS
5.0000 mg | ORAL_TABLET | Freq: Every day | ORAL | Status: DC
  Filled 2021-09-29: qty 1

## 2021-09-29 MED ORDER — BENZTROPINE MESYLATE 1 MG/ML IJ SOLN: 2.0000 mg | Freq: Two times a day (BID) | INTRAMUSCULAR | Status: DC | PRN

## 2021-09-29 MED ORDER — OLANZAPINE 10 MG IM SOLR: 5.0000 mg | Freq: Three times a day (TID) | INTRAMUSCULAR | Status: DC | PRN

## 2021-09-29 MED ORDER — OLANZAPINE 10 MG IM SOLR
5.0000 mg | Freq: Every day | INTRAMUSCULAR | Status: DC
  Administered 2021-09-29 – 2021-10-04 (×5): 5 mg via INTRAMUSCULAR
  Filled 2021-09-29 (×8): qty 10

## 2021-09-29 MED ORDER — OLANZAPINE 5 MG PO TABS
5.0000 mg | ORAL_TABLET | Freq: Every day | ORAL | Status: DC
  Filled 2021-09-29 (×8): qty 1

## 2021-09-29 MED ORDER — FLUPHENAZINE HCL 5 MG PO TABS
5.0000 mg | ORAL_TABLET | Freq: Every day | ORAL | Status: DC
  Filled 2021-09-29 (×2): qty 1

## 2021-09-29 MED ORDER — FLUPHENAZINE HCL 2.5 MG/ML IJ SOLN
5.0000 mg | Freq: Every day | INTRAMUSCULAR | Status: DC
  Administered 2021-09-29: 5 mg via INTRAMUSCULAR
  Filled 2021-09-29 (×2): qty 2

## 2021-09-29 MED ORDER — FLUPHENAZINE HCL 2.5 MG PO TABS
2.5000 mg | ORAL_TABLET | Freq: Two times a day (BID) | ORAL | Status: DC
  Filled 2021-09-29 (×4): qty 1

## 2021-09-29 MED ORDER — OLANZAPINE 10 MG IM SOLR
5.0000 mg | Freq: Every day | INTRAMUSCULAR | Status: DC
  Filled 2021-09-29: qty 10

## 2021-09-29 NOTE — Progress Notes (Signed)
[facility] MD Progress Note ? ?09/29/2021 5:08 PM ?Kaitlyn Duncan  ?MRN:  [id number] ?Subjective:   Kaitlyn Duncan is a 51 yr old female who presented to on 3/22 to [facility] under IVC with paranoia, hallucinations, and delusions.  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 ?  ?  ?Case was discussed in the multidisciplinary team. MAR was reviewed and patient was not compliant with medications.  She refused all medications yesterday and required her meds IM under the forced med order. ? Forced Medication Over Objection Order Placed by Dr. Mamie Levers 3/26 ?-Continue  Prolixin 10 mg PO or 5 mg IM TID if refuses PO ?-Continue Cogentin to 0.5 mg TID IM for antipsychotic side effects ?-Continue Agitation Protocol: Prolixin 2.5 mg PRN q8 ?- Have asked SW to place patient on [facility] wait list ? ?On assessment today patient was seen refusing her PO meds and requiring IM medications. Patient was not participatory in assessment rather calling out to attending provider and staff.  ? ?Patient appears to delusional and grandiose endorsing that she is the leader of a world wide religious association. Patient endorsed beliefs that she was in a "higher realm" and that she has a more special connection to [place] and [name] and that she is anointed. Patient then began reciting brief parts of the bible and when providers attempted to ask safety questions patient began to speak in "tongues."  Patient only stopped briefly from speaking in "tongues" when a provider pointed out that this is what she was doing. Patient is hyperreligious cursing staff and providers to hell and calling female provider Regulatory affairs officer) a Product/process development scientist. Interestingly she can remember all of the  staff's name accept for this writer, despite this [place] seen her yesterday and multiple times in the past. Patient initially believed that attending provider was her lawyer and recalled seeing him on Sunday.  ? ?Patient did clearly report that she does not believe she has schizophrenia and she does not believe she needs a guardian or medication.  ? ?Principal Problem: Schizophrenia ([place]) ?Diagnosis: Principal Problem: ?  Schizophrenia ([place]) ? ?Total Time spent with patient: 20 minutes ? ?Past Psychiatric History: See H&P ? ?Past Medical History:  ?Past Medical History:  ?Diagnosis Date  ? Allergy   ? Anemia   ? Gallstones   ? GERD (gastroesophageal reflux disease)   ? Hemorrhoids   ? melanoma with met dz dx'd 01/2018  ? brain and adrenal  ? Seasonal allergies   ?  ?Past Surgical History:  ?Procedure Laterality Date  ? BIOPSY  01/28/2018  ? Procedure: BIOPSY;  Surgeon: Laurence Spates, MD;  Location: [facility];  Service: Endoscopy;;  ? BREAST BIOPSY Left   ? ESOPHAGOGASTRODUODENOSCOPY N/A 01/28/2018  ? Procedure: ESOPHAGOGASTRODUODENOSCOPY (EGD);  Surgeon: Laurence Spates, MD;  Location: [facility];  Service: Endoscopy;  Laterality: N/A;  ? MOUTH SURGERY    ? OVARY SURGERY    ? removed "something"  ? ?Family History:  ?Family History  ?Problem Relation Age of Onset  ? Melanoma Mother   ? Hypertension Father   ? Breast cancer Maternal Aunt   ? Breast cancer Paternal Aunt   ? Diabetes Paternal Aunt   ? Breast cancer Maternal Grandmother   ? Breast cancer Paternal  Grandmother   ? Diabetes Paternal Grandmother   ? Colon cancer Neg Hx   ? Esophageal cancer Neg Hx   ? Pancreatic cancer Neg Hx   ? Stomach cancer Neg Hx   ? Liver disease Neg Hx   ? Rectal cancer Neg Hx   ? ?Family Psychiatric  History:  See H&P ?Social History:  ?Social History  ? ?Substance and Sexual Activity  ?Alcohol Use No  ?   ?Social History  ? ?Substance and Sexual Activity  ?Drug Use No  ?  ?Social History  ? ?Socioeconomic History  ? Marital  status: Single  ?  Spouse name: Not on file  ? Number of children: Not on file  ? Years of education: Not on file  ? Highest education level: Not on file  ?Occupational History  ? Not on file  ?Tobacco Use  ? Smoking status: Never  ? Smokeless tobacco: Never  ?Vaping Use  ? Vaping Use: Never used  ?Substance and Sexual Activity  ? Alcohol use: No  ? Drug use: No  ? Sexual activity: Not Currently  ?Other Topics Concern  ? Not on file  ?Social History Narrative  ? Not on file  ? ?Social Determinants of Health  ? ?Financial Resource Strain: Not on file  ?Food Insecurity: Not on file  ?Transportation Needs: Not on file  ?Physical Activity: Not on file  ?Stress: Not on file  ?Social Connections: Not on file  ? ?Additional Social History:  ?  ?  ?  ?  ?  ?  ?  ?  ?  ?  ?  ? ?Sleep: Good ? ?Appetite:  Fair ? ?Current Medications: ?Current Facility-Administered Medications  ?Medication Dose Route Frequency Provider Last Rate Last Admin  ? acetaminophen (TYLENOL) tablet 650 mg  650 mg Oral Q6H PRN White, Patrice L, NP      ? alum & mag hydroxide-simeth (MAALOX/MYLANTA) 200-200-20 MG/5ML suspension 30 mL  30 mL Oral Q4H PRN White, Patrice L, NP      ? benztropine mesylate (COGENTIN) injection 2 mg  2 mg Intramuscular BID PRN Massengill, Ovid Curd, MD      ? fluPHENAZine (PROLIXIN) injection 2.5 mg  2.5 mg Intramuscular BID WC Damita Dunnings B, MD   2.5 mg at 09/29/21 1437  ? Or  ? fluPHENAZine (PROLIXIN) tablet 2.5 mg  2.5 mg Oral BID WC Damita Dunnings B, MD      ? fluPHENAZine (PROLIXIN) tablet 5 mg  5 mg Oral QHS Massengill, Nathan, MD      ? Or  ? fluPHENAZine (PROLIXIN) injection 5 mg  5 mg Intramuscular QHS Massengill, Nathan, MD      ? hydrOXYzine (ATARAX) tablet 25 mg  25 mg Oral TID PRN White, Patrice L, NP      ? magnesium hydroxide (MILK OF MAGNESIA) suspension 30 mL  30 mL Oral Daily PRN White, Patrice L, NP      ? OLANZapine (ZYPREXA) tablet 5 mg  5 mg Oral QHS Massengill, Nathan, MD      ? Or  ? OLANZapine (ZYPREXA)  injection 5 mg  5 mg Intramuscular QHS Massengill, Nathan, MD      ? OLANZapine (ZYPREXA) injection 5 mg  5 mg Intramuscular Q8H PRN Massengill, Nathan, MD      ? OLANZapine (ZYPREXA) tablet 5 mg  5 mg Oral Q8H PRN Massengill, Nathan, MD      ? traZODone (DESYREL) tablet 50 mg  50 mg Oral QHS PRN Janine Limbo, MD      ? ? ?  Lab Results: No results found for this or any previous visit (from the past 48 hour(s)). ? ?Blood Alcohol level:  ?Lab Results  ?Component Value Date  ? ETH <10 09/23/2021  ? ETH <10 06/25/2021  ? ? ?Metabolic Disorder Labs: ?Lab Results  ?Component Value Date  ? HGBA1C 5.1 06/28/2021  ? MPG 99.67 06/28/2021  ? MPG 119.76 02/03/2018  ? ?No results found for: PROLACTIN ?Lab Results  ?Component Value Date  ? CHOL 212 (H) 06/28/2021  ? TRIG 56 06/28/2021  ? HDL 63 06/28/2021  ? CHOLHDL 3.4 06/28/2021  ? VLDL 11 06/28/2021  ? LDLCALC 138 (H) 06/28/2021  ? [place] 91 02/03/2018  ? ? ?Physical Findings: ?AIMS: Facial and Oral Movements ?Muscles of Facial Expression: None, normal ?Lips and Perioral Area: None, normal ?Jaw: None, normal ?Tongue: None, normal,Extremity Movements ?Upper (arms, wrists, hands, fingers): None, normal ?Lower (legs, knees, ankles, toes): None, normal, Trunk Movements ?Neck, shoulders, hips: None, normal, Overall Severity ?Severity of abnormal movements (highest score from questions above): None, normal ?Incapacitation due to abnormal movements: None, normal ?Patient's awareness of abnormal movements (rate only patient's report): No Awareness, Dental Status ?Current problems with teeth and/or dentures?: No ?Does patient usually wear dentures?: No  ?CIWA:    ?COWS:    ? ?Musculoskeletal: ?Strength & Muscle Tone: within normal limits ?Gait & Station:  remains sitting ?Patient leans: N/A ? ?Psychiatric Specialty Exam: ? ?Presentation  ?General Appearance: Bizarre (Bizarre, cooped up in the hallway nook, avoiding PRNs by covering herself in her blanket, but is wearing her socks  and shoes today) ? ?Eye Contact:Minimal ? ?Speech:Clear and Coherent (oddly paced, slow but has a flow) ? ?Speech Volume:Increased ? ?Handedness:Right ? ? ?Mood and Affect  ?Mood:Angry ? ?Affect:Blunt ? ?

## 2021-09-29 NOTE — Progress Notes (Signed)
?   09/29/21 2100  ?Psych Admission Type (Psych Patients Only)  ?Admission Status Involuntary  ?Psychosocial Assessment  ?Patient Complaints Agitation  ?Eye Contact Avoids  ?Facial Expression Angry  ?Affect Blunted  ?Speech Pressured;Argumentative  ?Interaction Defensive  ?Motor Activity Slow;Unsteady  ?Appearance/Hygiene Unremarkable  ?Behavior Characteristics Cooperative  ?Mood Labile;Suspicious  ?Aggressive Behavior  ?Effect No apparent injury  ?Thought Process  ?Coherency Tangential;Disorganized  ?Content Preoccupation;Blaming others  ?Delusions Religious  ?Perception Derealization  ?Hallucination None reported or observed  ?Judgment Poor  ?Confusion Moderate  ?Danger to Self  ?Current suicidal ideation? Denies  ?Danger to Others  ?Danger to Others None reported or observed  ? ? ?

## 2021-09-29 NOTE — Progress Notes (Signed)
?   09/29/21 0800  ?Psych Admission Type (Psych Patients Only)  ?Admission Status Involuntary  ?Psychosocial Assessment  ?Patient Complaints Agitation  ?Eye Contact Avoids  ?Facial Expression Angry;Animated;Anxious  ?Affect Blunted  ?Speech Argumentative;Pressured  ?Interaction Defensive  ?Motor Activity Slow  ?Appearance/Hygiene Unremarkable  ?Behavior Characteristics Anxious;Agitated  ?Mood Labile;Suspicious  ?Aggressive Behavior  ?Effect No apparent injury  ?Thought Process  ?Coherency Disorganized;Tangential  ?Content Preoccupation;Religiosity  ?Delusions Religious;Grandeur  ?Perception Derealization  ?Hallucination None reported or observed  ?Judgment Poor  ?Confusion Moderate  ?Danger to Self  ?Current suicidal ideation? Denies  ?Danger to Others  ?Danger to Others None reported or observed  ? ?Patient is irritable, and noncompliant with routine care. Staff was in patient room for assessment this AM, patient refused to respond to any question and screamed at staff to get out of the room. Patient states "I don't want any medication, get out and never come back." Patient is hyper-religious, screaming at staff and saying,   "You will all go to hell."  Medication had to be forced by injection per MD order,  with help of security and staff. Patient is in room at this time,  Q 15 minutes safety observation in place. Staff will continue to monitor. ?

## 2021-09-29 NOTE — Group Note (Addendum)
Recreation Therapy Group Note ? ? ?Group Topic:Stress Management ?Group Date: 09/29/2021 ?Start Time: 1020 ?End Time: 0177 ?Facilitators: Victorino Sparrow, LRT,CTRS ?Location: [place] ? ? ?Goal Area(s) Addresses:  ?Patient will be able to use music as a stress reliever. ?Patient will identify benefits of using stress management post d/c. ? ?Group Description:  LRT went to patients rooms and sat in the doorway to play music for each patient to help them relieve some stress from being in rooms due to lock down.  Patients could pick the songs of their choosing as long as they were clean and appropriate.  ? ? ?Affect/Mood: N/A ?  ?Participation Level: Did not participate ?  ? ?Clinical Observations/Individualized Feedback:   ? ? ?Plan: Continue to engage patient in RT group sessions 2-3x/week. ? ? ?Victorino Sparrow, LRT,CTRS ?09/29/2021 12:28 PM ?

## 2021-09-29 NOTE — Plan of Care (Signed)
?  Problem: Coping: ?Goal: Coping ability will improve ?Outcome: Not Progressing ?Goal: Will verbalize feelings ?Outcome: Not Progressing ?  ?Problem: Role Relationship: ?Goal: Ability to communicate needs accurately will improve ?Outcome: Not Progressing ?Goal: Ability to interact with others will improve ?Outcome: Not Progressing ?  ?

## 2021-09-29 NOTE — Progress Notes (Signed)
Pt did not agree with taking the medication, but accepted the medication without in incident, pt continued to state all staff were "going to hell" ?

## 2021-09-29 NOTE — Progress Notes (Signed)
?   09/29/21 0500  ?Sleep  ?Number of Hours 8  ? ? ?

## 2021-09-30 MED ORDER — OLANZAPINE 2.5 MG PO TABS
2.5000 mg | ORAL_TABLET | Freq: Every day | ORAL | Status: DC
  Filled 2021-09-30 (×3): qty 1

## 2021-09-30 MED ORDER — FLUPHENAZINE HCL 2.5 MG/ML IJ SOLN
2.5000 mg | Freq: Two times a day (BID) | INTRAMUSCULAR | Status: DC
  Administered 2021-09-30 – 2021-10-05 (×9): 2.5 mg via INTRAMUSCULAR
  Filled 2021-09-30 (×5): qty 1
  Filled 2021-09-30: qty 10
  Filled 2021-09-30 (×13): qty 1

## 2021-09-30 MED ORDER — FLUPHENAZINE HCL 2.5 MG/ML IJ SOLN
5.0000 mg | Freq: Every day | INTRAMUSCULAR | Status: DC
  Administered 2021-09-30 – 2021-10-05 (×6): 5 mg via INTRAMUSCULAR
  Filled 2021-09-30 (×4): qty 2
  Filled 2021-09-30 (×2): qty 10
  Filled 2021-09-30 (×4): qty 2
  Filled 2021-09-30: qty 10
  Filled 2021-09-30: qty 2

## 2021-09-30 MED ORDER — FLUPHENAZINE HCL 5 MG PO TABS
5.0000 mg | ORAL_TABLET | Freq: Two times a day (BID) | ORAL | Status: DC
  Administered 2021-10-03 – 2021-10-07 (×4): 5 mg via ORAL
  Filled 2021-09-30 (×18): qty 1

## 2021-09-30 MED ORDER — OLANZAPINE 10 MG IM SOLR
2.5000 mg | Freq: Every day | INTRAMUSCULAR | Status: DC
  Administered 2021-09-30: 2.5 mg via INTRAMUSCULAR
  Filled 2021-09-30 (×3): qty 10

## 2021-09-30 MED ORDER — FLUPHENAZINE HCL 10 MG PO TABS
10.0000 mg | ORAL_TABLET | Freq: Every day | ORAL | Status: DC
  Administered 2021-10-06: 10 mg via ORAL
  Filled 2021-09-30 (×9): qty 1

## 2021-09-30 MED ORDER — OLANZAPINE 10 MG IM SOLR
INTRAMUSCULAR | Status: AC
  Administered 2021-09-30: 5 mg via INTRAMUSCULAR
  Filled 2021-09-30: qty 10

## 2021-09-30 NOTE — Progress Notes (Signed)
D- Patient alert and oriented x2. Denies SI, HI, AVH. Pt remains preoccupied and disorganized. Pt argumentative and unwilling to participate. Pt refusing bedtime medications. Pt verbalized there is nothing wrong with her.   ?  ?A- Scheduled medications administered to patient, given IM per MD orders.Routine safety checks conducted every 15 minutes.  Patient informed to notify staff with problems or concerns. ?  ?R- Patient in bed, resting comfortably.  ? ? ? 09/30/21 1950  ?Psych Admission Type (Psych Patients Only)  ?Admission Status Involuntary  ?Psychosocial Assessment  ?Patient Complaints Irritability  ?Eye Contact Brief  ?Facial Expression Animated  ?Affect Irritable;Blunted  ?Speech Argumentative;Pressured  ?Interaction Demanding;Defensive  ?Motor Activity Slow  ?Appearance/Hygiene Unremarkable  ?Behavior Characteristics Unwilling to participate;Irritable  ?Mood Labile  ?Thought Process  ?Coherency Disorganized  ?Content Blaming others;Preoccupation;Religiosity  ?Delusions Religious  ?Perception Derealization  ?Hallucination None reported or observed  ?Judgment Poor  ?Confusion Moderate  ?Danger to Self  ?Current suicidal ideation? Denies  ?Danger to Others  ?Danger to Others None reported or observed  ? ? ?

## 2021-09-30 NOTE — Progress Notes (Signed)
Pt refused vital signs.

## 2021-09-30 NOTE — Group Note (Signed)
LCSW Group Therapy Note ? ? ?Group Date: 09/30/2021 ?Start Time: 1300 ?End Time: 1400 ? ?  ?Topic: Sleep Hygiene ?  ?Participation: Did not attend; Patient refused to accept packet. ?  ?Due to the illness on the unit, Infectious Disease has recommended no close contact at this time, therefore group was not held. Patient was provided therapeutic worksheets and asked to meet with CSW to discuss those worksheets as needed.   ? ? ?Vassie Moselle, LCSW ?09/30/2021  1:20 PM   ? ?

## 2021-09-30 NOTE — Progress Notes (Signed)
[facility] MD Progress Note ? ?09/30/2021 4:58 PM ?Kaitlyn Duncan  ?MRN:  [id number] ?Subjective:    Kaitlyn Duncan is a 51 yr old female who presented to on 3/22 to [facility] under IVC with paranoia, hallucinations, and delusions.  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 ?  ?  ?Case was discussed in the multidisciplinary team. MAR was reviewed and patient was not compliant with medications.  She refused all medications yesterday and required her meds IM under the forced med order. ?-Change prolixin to 2.5 mg/2.$RemoveBef'5mg'xZfjwkCjqR$ /$Remove'5mg'ZCqMHqk$  (IM back-up for forced meds) ?-Start zyprexa 5 mg qhs (IM back-up for forced meds) ?-Stop cogentin due to olanzapine being anticholinergic ?-start cogentin 2 mg IM PRN for tremor or dystonic reaction  ?-Change agitation protocol from prolixin to zyprexa 5 mg PO and IM PRN  ?-start trazodone 50 mg qhs prn  ?  ? ?On assessment today patient refuses to pull blankets off her face. Patient does report that she is tired, and then yell that no one is allowed to enter her room. Patient continues to mention that she has a special connection to [name] and at one point speaks about herself in the 3rd person as if this is [name] speaking about her and that no one can come into her room. Patient does not answer questions regarding SI or HI. Patient does says that she may "possibly" be having AVH because " she is a spiritual being." ? ?Per Rns patient continues to "curse" people usually staff to [place].  ? ?Principal Problem: Schizophrenia ([place]) ?Diagnosis: Principal Problem: ?  Schizophrenia ([place]) ? ?Total Time spent with patient: 15 minutes ? ?Past Psychiatric History: See H&P ? ?Past Medical History:  ?Past Medical  History:  ?Diagnosis Date  ? Allergy   ? Anemia   ? Gallstones   ? GERD (gastroesophageal reflux disease)   ? Hemorrhoids   ? melanoma with met dz dx'd 01/2018  ? brain and adrenal  ? Seasonal allergies   ?  ?Past Surgical History:  ?Procedure Laterality Date  ? BIOPSY  01/28/2018  ? Procedure: BIOPSY;  Surgeon: Laurence Spates, MD;  Location: [facility];  Service: Endoscopy;;  ? BREAST BIOPSY Left   ? ESOPHAGOGASTRODUODENOSCOPY N/A 01/28/2018  ? Procedure: ESOPHAGOGASTRODUODENOSCOPY (EGD);  Surgeon: Laurence Spates, MD;  Location: [facility];  Service: Endoscopy;  Laterality: N/A;  ? MOUTH SURGERY    ? OVARY SURGERY    ? removed "something"  ? ?Family History:  ?Family History  ?Problem Relation Age of Onset  ? Melanoma Mother   ? Hypertension Father   ? Breast cancer Maternal Aunt   ? Breast cancer Paternal Aunt   ? Diabetes Paternal Aunt   ? Breast cancer Maternal Grandmother   ? Breast cancer Paternal Grandmother   ? Diabetes Paternal Grandmother   ? Colon cancer Neg Hx   ? Esophageal cancer Neg Hx   ? Pancreatic cancer Neg Hx   ? Stomach cancer Neg Hx   ? Liver disease Neg Hx   ? Rectal cancer Neg Hx   ? ?Family Psychiatric  History:  See H&P ?Social History:  ?Social History  ? ?Substance and Sexual Activity  ?Alcohol Use No  ?   ?Social History  ? ?  Substance and Sexual Activity  ?Drug Use No  ?  ?Social History  ? ?Socioeconomic History  ? Marital status: Single  ?  Spouse name: Not on file  ? Number of children: Not on file  ? Years of education: Not on file  ? Highest education level: Not on file  ?Occupational History  ? Not on file  ?Tobacco Use  ? Smoking status: Never  ? Smokeless tobacco: Never  ?Vaping Use  ? Vaping Use: Never used  ?Substance and Sexual Activity  ? Alcohol use: No  ? Drug use: No  ? Sexual activity: Not Currently  ?Other Topics Concern  ? Not on file  ?Social History Narrative  ? Not on file  ? ?Social Determinants of Health  ? ?Financial Resource Strain: Not on file  ?Food  Insecurity: Not on file  ?Transportation Needs: Not on file  ?Physical Activity: Not on file  ?Stress: Not on file  ?Social Connections: Not on file  ? ?Additional Social History:  ?  ?  ?  ?  ?  ?  ?  ?  ?  ?  ?  ? ?Sleep: Good ? ?Appetite:   breakfast uneaten ? ?Current Medications: ?Current Facility-Administered Medications  ?Medication Dose Route Frequency Provider Last Rate Last Admin  ? acetaminophen (TYLENOL) tablet 650 mg  650 mg Oral Q6H PRN White, Patrice L, NP      ? alum & mag hydroxide-simeth (MAALOX/MYLANTA) 200-200-20 MG/5ML suspension 30 mL  30 mL Oral Q4H PRN White, Patrice L, NP      ? benztropine mesylate (COGENTIN) injection 2 mg  2 mg Intramuscular BID PRN Massengill, Ovid Curd, MD      ? fluPHENAZine (PROLIXIN) injection 2.5 mg  2.5 mg Intramuscular BID WC Damita Dunnings B, MD   2.5 mg at 09/30/21 1303  ? Or  ? fluPHENAZine (PROLIXIN) tablet 5 mg  5 mg Oral BID WC Damita Dunnings B, MD      ? fluPHENAZine (PROLIXIN) tablet 10 mg  10 mg Oral QHS Damita Dunnings B, MD      ? Or  ? fluPHENAZine (PROLIXIN) injection 5 mg  5 mg Intramuscular QHS Damita Dunnings B, MD      ? hydrOXYzine (ATARAX) tablet 25 mg  25 mg Oral TID PRN White, Patrice L, NP      ? magnesium hydroxide (MILK OF MAGNESIA) suspension 30 mL  30 mL Oral Daily PRN White, Patrice L, NP      ? OLANZapine (ZYPREXA) injection 2.5 mg  2.5 mg Intramuscular Daily Damita Dunnings B, MD   2.5 mg at 09/30/21 1202  ? Or  ? OLANZapine (ZYPREXA) tablet 2.5 mg  2.5 mg Oral Daily Damita Dunnings B, MD      ? OLANZapine (ZYPREXA) tablet 5 mg  5 mg Oral QHS Massengill, Nathan, MD      ? Or  ? OLANZapine (ZYPREXA) injection 5 mg  5 mg Intramuscular QHS Massengill, Ovid Curd, MD   5 mg at 09/29/21 2021  ? OLANZapine (ZYPREXA) injection 5 mg  5 mg Intramuscular Q8H PRN Massengill, Nathan, MD      ? OLANZapine (ZYPREXA) tablet 5 mg  5 mg Oral Q8H PRN Massengill, Nathan, MD      ? traZODone (DESYREL) tablet 50 mg  50 mg Oral QHS PRN Massengill, Ovid Curd, MD      ? ? ?Lab  Results: No results found for this or any previous visit (from the past 48 hour(s)). ? ?Blood Alcohol level:  ?Lab  Results  ?Component Value Date  ? ETH <10 09/23/2021  ? ETH <10 06/25/2021  ? ? ?Metabolic Disorder Labs: ?Lab Results  ?Component Value Date  ? HGBA1C 5.1 06/28/2021  ? MPG 99.67 06/28/2021  ? MPG 119.76 02/03/2018  ? ?No results found for: PROLACTIN ?Lab Results  ?Component Value Date  ? CHOL 212 (H) 06/28/2021  ? TRIG 56 06/28/2021  ? HDL 63 06/28/2021  ? CHOLHDL 3.4 06/28/2021  ? VLDL 11 06/28/2021  ? LDLCALC 138 (H) 06/28/2021  ? [place] 91 02/03/2018  ? ? ?Physical Findings: ?AIMS: Facial and Oral Movements ?Muscles of Facial Expression: None, normal ?Lips and Perioral Area: None, normal ?Jaw: None, normal ?Tongue: None, normal,Extremity Movements ?Upper (arms, wrists, hands, fingers): None, normal ?Lower (legs, knees, ankles, toes): None, normal, Trunk Movements ?Neck, shoulders, hips: None, normal, Overall Severity ?Severity of abnormal movements (highest score from questions above): None, normal ?Incapacitation due to abnormal movements: None, normal ?Patient's awareness of abnormal movements (rate only patient's report): No Awareness, Dental Status ?Current problems with teeth and/or dentures?: No ?Does patient usually wear dentures?: No  ?CIWA:    ?COWS:    ? ?Musculoskeletal: ?Strength & Muscle Tone: within normal limits ?Gait & Station:  remains in bed ?Patient leans: N/A ? ?Psychiatric Specialty Exam: ? ?Presentation  ?General Appearance: -- (keeps face covered and body covered in bed, refuses to pull covers down) ? ?Eye Contact:None ? ?Speech:Clear and Coherent ? ?Speech Volume:Increased ? ?Handedness:Right ? ? ?Mood and Affect  ?Mood:Irritable; Angry ? ?Affect:-- (unknown) ? ? ?Thought Process  ?Thought Processes:Linear ? ?Descriptions of Associations:Circumstantial ? ?Orientation:-- (unknown) ? ?Thought Content:Delusions ? ?History of Schizophrenia/Schizoaffective  disorder:No ? ?Duration of Psychotic Symptoms:Greater than six months ? ?Hallucinations:Hallucinations: -- (unknown, but not endorsed) ? ?Ideas of Reference:Delusions ? ?Suicidal Thoughts:Suicidal Thoughts: -- (not endorsed) ? ?Homicidal

## 2021-09-30 NOTE — Progress Notes (Signed)
?   09/30/21 0500  ?Sleep  ?Number of Hours 7.5  ? ? ?

## 2021-09-30 NOTE — Group Note (Signed)
Recreation Therapy Group Note ? ? ?Group Topic:Leisure Education  ?Group Date: 09/30/2021 ?Start Time: 1010 ?End Time: 1140 ?Facilitators: Victorino Sparrow, LRT,CTRS ?Location: [place] ? ? ?Goal Area(s) Addresses:  ?Patient will review and complete packet supporting identification of healthy leisure and recreation activities.  ? ?Activity:  Patients were given packets to dealing with what leisure and the benefits it provides to the individual.  The packet also included leisure activities to help patients identify different types of leisure (social, creative, relaxation, etc), what activities can be done as leisure and identifying their personal views of leisure. ? ? ?Affect/Mood: Appropriate ?  ?Participation Quality: Independent ?  ? ?Clinical Observations/Individualized Feedback:  LRT provided pt a packet reviewing leisure and its impact on emotional states and personal fulfillment.  Patients were to complete packets in their rooms at their own pace due to not being able to leave rooms due to illnesses on unit. ? ? ?Plan: Continue to engage patient in RT group sessions 2-3x/week. ? ? ?Victorino Sparrow, LRT,CTRS ?09/30/2021 1:46 PM ?

## 2021-09-30 NOTE — Progress Notes (Signed)
Pt is isolative to room majority of this shift per unit routines at this time. Continues to be hyper-religious, verbally hostile and abusive on interactions when approach for scheduled medications. Receives all medications IM due to continual refusal via PO route. Tolerates meals and fluids well. Falls and Q 15 minutes safety checks maintained without incident. Emotional support, encouragement and reassurance provided to pt. Verbal education provided on all medications and effects monitored.  ?Pt awake in bed. Denies concerns and remains safe on unit.  ?

## 2021-09-30 NOTE — [facility] Group Notes (Signed)
LCSW Group Therapy Note ? ?Date/Time: 09/28/2021 @ 1pm ?  ?Type of Therapy and Topic:  Group Therapy:  Distress Tolerance ?  ?Participation Level: Active ?  ?Description of Group:   ?Patients work on coming up with a distress tolerance plan.  Patients shared triggers and identified triggers that they have.  They were then able to identify thoughts, feelings, physical sensations and urges/actions that occur when they are triggered.  Patients worked on the coping mechanism of doing opposite actions as a way to cope when they are distressed.  Patients were able to provide clinicians goals and steps they would like to work on to assist with their distress tolerance plan. ?  ?Therapeutic Goals: ?Patient will identify triggers that are contributing to a problem in their life ?Patient will identify unwanted behaviors, feelings, physical sensations and unwanted actions associated with a trigger.  ?Patient will share with other group members strategies to confront and avoid triggers so that they may be able to react appropriately to triggers in daily life.  ?Patient will work on a distress tolerance plan to assist them with coping with distress in future situations.  ?  ?  ?Summary of Patient Progress: Due to illness on the unit, ID recommends no groups to be held.  Patient provided a packet about distress tolerance and advised to work through the packet to assist with additional coping skills.  Patient encouraged to seek out social work as they work through the packet to ask additional questions associated with topic.  Patient given opportunities to ask questions to CSW.  ?  ?  ?Therapeutic Modalities:   ?Cognitive Behavioral Therapy ?Solution Focused Therapy ?Motivational Interviewing ?Family Systems Approach ? ? ? ?[place] ?09/30/2021, 12:14 PM ? ?

## 2021-10-01 MED ORDER — OLANZAPINE 10 MG IM SOLR
5.0000 mg | Freq: Every day | INTRAMUSCULAR | Status: DC
  Administered 2021-10-02 – 2021-10-07 (×5): 5 mg via INTRAMUSCULAR
  Filled 2021-10-01 (×8): qty 10

## 2021-10-01 MED ORDER — OLANZAPINE 5 MG PO TABS
5.0000 mg | ORAL_TABLET | Freq: Every day | ORAL | Status: DC
  Administered 2021-10-06: 5 mg via ORAL
  Filled 2021-10-01 (×8): qty 1

## 2021-10-01 NOTE — Progress Notes (Signed)
?   10/01/21 1224  ?Sleep  ?Number of Hours 9.25  ? ? ?
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
## 2021-10-01 NOTE — Progress Notes (Signed)
[facility] MD Progress Note ? ?10/01/2021 4:05 PM ?Kaitlyn Duncan  ?MRN:  [id number] ?Subjective:   Kaitlyn Duncan is a 51 yr old female who presented to on 3/22 to [facility] under IVC with paranoia, hallucinations, and delusions.  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 ?  ?  ?Case was discussed in the multidisciplinary team. MAR was reviewed and patient was not compliant with medications.  She refused all medications yesterday and required all her meds IM under the forced med order. ?Forced Medication Over Objection Order Placed by Dr. Mamie Levers 3/26 ?- Prolixin  ?-- Current regimen TID dosing: 2.5mg  / 2.5mg / 5mg   IM if refueses PO, if willing to take PO regimen is 5mg  /5mg  /10mg  ?  ?- Change Zyprexa to 2.5mg  PO daily and 5mg  QHS PO, for psychosis and anticholinergic affects w/ IM back up for forced meds ?  ?- Continue Trazodone 50mg  QHS PRN ?  ?- Continue cogentin 2 mg IM PRN for tremor or dystonic reaction  ?-Continue Agitation Protocol: zyprexa 5 mg PO and IM PRN  ? ? On assessment this AM patient does not recognize provider again. Patient refuses to speak or interact with provider and promptly covers her face and body with blankets after staff leave from administering medication. Patient was noted to struggle with staff when they gave her her meds and continues to curse staff to "the Dougherty of hell" when she gets upset. Patient finally spoke a few words to this provider, but did not answer any questions. Patient referred to herself in 3rd person as to how she was a spiritual being and that she should not be touched. Patient did not answer safety questions but also did not endorse SI, HI or AVH today.  ?Principal  Problem: Schizophrenia ([place]) ?Diagnosis: Principal Problem: ?  Schizophrenia ([place]) ? ?Total Time spent with patient: 15 minutes ? ?Past Psychiatric History: See H&P ? ?Past Medical History:  ?Past Medical History:  ?Diagnosis Date  ? Allergy   ? Anemia   ? Gallstones   ? GERD (gastroesophageal reflux disease)   ? Hemorrhoids   ? melanoma with met dz dx'd 01/2018  ? brain and adrenal  ? Seasonal allergies   ?  ?Past Surgical History:  ?Procedure Laterality Date  ? BIOPSY  01/28/2018  ? Procedure: BIOPSY;  Surgeon: Laurence Spates, MD;  Location: [facility];  Service: Endoscopy;;  ? BREAST BIOPSY Left   ? ESOPHAGOGASTRODUODENOSCOPY N/A 01/28/2018  ? Procedure: ESOPHAGOGASTRODUODENOSCOPY (EGD);  Surgeon: Laurence Spates, MD;  Location: [facility];  Service: Endoscopy;  Laterality: N/A;  ? MOUTH SURGERY    ? OVARY SURGERY    ? removed "something"  ? ?Family History:  ?Family History  ?Problem Relation Age of Onset  ? Melanoma Mother   ? Hypertension Father   ? Breast cancer Maternal Aunt   ? Breast cancer Paternal Aunt   ? Diabetes Paternal Aunt   ? Breast cancer Maternal Grandmother   ? Breast cancer Paternal Grandmother   ? Diabetes Paternal Grandmother   ? Colon cancer Neg Hx   ? Esophageal cancer Neg Hx   ? Pancreatic cancer Neg Hx   ? Stomach cancer Neg Hx   ? Liver disease Neg Hx   ?  Rectal cancer Neg Hx   ? ?Family Psychiatric  History:  See H&P ?Social History:  ?Social History  ? ?Substance and Sexual Activity  ?Alcohol Use No  ?   ?Social History  ? ?Substance and Sexual Activity  ?Drug Use No  ?  ?Social History  ? ?Socioeconomic History  ? Marital status: Single  ?  Spouse name: Not on file  ? Number of children: Not on file  ? Years of education: Not on file  ? Highest education level: Not on file  ?Occupational History  ? Not on file  ?Tobacco Use  ? Smoking status: Never  ? Smokeless tobacco: Never  ?Vaping Use  ? Vaping Use: Never used  ?Substance and Sexual Activity  ? Alcohol use: No  ? Drug use: No  ?  Sexual activity: Not Currently  ?Other Topics Concern  ? Not on file  ?Social History Narrative  ? Not on file  ? ?Social Determinants of Health  ? ?Financial Resource Strain: Not on file  ?Food Insecurity: Not on file  ?Transportation Needs: Not on file  ?Physical Activity: Not on file  ?Stress: Not on file  ?Social Connections: Not on file  ? ?Additional Social History:  ?  ?  ?  ?  ?  ?  ?  ?  ?  ?  ?  ? ?Sleep: Good ? ?Appetite:   Unknown ? ?Current Medications: ?Current Facility-Administered Medications  ?Medication Dose Route Frequency Provider Last Rate Last Admin  ? acetaminophen (TYLENOL) tablet 650 mg  650 mg Oral Q6H PRN White, Patrice L, NP      ? alum & mag hydroxide-simeth (MAALOX/MYLANTA) 200-200-20 MG/5ML suspension 30 mL  30 mL Oral Q4H PRN White, Patrice L, NP      ? benztropine mesylate (COGENTIN) injection 2 mg  2 mg Intramuscular BID PRN Massengill, Ovid Curd, MD      ? fluPHENAZine (PROLIXIN) injection 2.5 mg  2.5 mg Intramuscular BID WC Damita Dunnings B, MD   2.5 mg at 10/01/21 1343  ? Or  ? fluPHENAZine (PROLIXIN) tablet 5 mg  5 mg Oral BID WC Damita Dunnings B, MD      ? fluPHENAZine (PROLIXIN) tablet 10 mg  10 mg Oral QHS Damita Dunnings B, MD      ? Or  ? fluPHENAZine (PROLIXIN) injection 5 mg  5 mg Intramuscular QHS Damita Dunnings B, MD   5 mg at 09/30/21 1957  ? hydrOXYzine (ATARAX) tablet 25 mg  25 mg Oral TID PRN White, Patrice L, NP      ? magnesium hydroxide (MILK OF MAGNESIA) suspension 30 mL  30 mL Oral Daily PRN White, Patrice L, NP      ? OLANZapine (ZYPREXA) tablet 5 mg  5 mg Oral QHS Massengill, Nathan, MD      ? Or  ? OLANZapine (ZYPREXA) injection 5 mg  5 mg Intramuscular QHS Massengill, Ovid Curd, MD   5 mg at 09/30/21 1956  ? OLANZapine (ZYPREXA) injection 5 mg  5 mg Intramuscular Q8H PRN Massengill, Ovid Curd, MD      ? Derrill Memo ON 10/02/2021] OLANZapine (ZYPREXA) injection 5 mg  5 mg Intramuscular Daily Damita Dunnings B, MD      ? Or  ? [START ON 10/02/2021] OLANZapine (ZYPREXA) tablet 5 mg   5 mg Oral Daily Jodean Valade B, MD      ? OLANZapine (ZYPREXA) tablet 5 mg  5 mg Oral Q8H PRN Janine Limbo, MD      ? traZODone (  DESYREL) tablet 50 mg  50 mg Oral QHS PRN Massengill, Nathan, MD      ? ? ?Lab Results: No results found for this or any previous visit (from the past 48 hour(s)). ? ?Blood Alcohol level:  ?Lab Results  ?Component Value Date  ? ETH <10 09/23/2021  ? ETH <10 06/25/2021  ? ? ?Metabolic Disorder Labs: ?Lab Results  ?Component Value Date  ? HGBA1C 5.1 06/28/2021  ? MPG 99.67 06/28/2021  ? MPG 119.76 02/03/2018  ? ?No results found for: PROLACTIN ?Lab Results  ?Component Value Date  ? CHOL 212 (H) 06/28/2021  ? TRIG 56 06/28/2021  ? HDL 63 06/28/2021  ? CHOLHDL 3.4 06/28/2021  ? VLDL 11 06/28/2021  ? LDLCALC 138 (H) 06/28/2021  ? [place] 91 02/03/2018  ? ? ?Physical Findings: ?AIMS: Facial and Oral Movements ?Muscles of Facial Expression: None, normal ?Lips and Perioral Area: None, normal ?Jaw: None, normal ?Tongue: None, normal,Extremity Movements ?Upper (arms, wrists, hands, fingers): None, normal ?Lower (legs, knees, ankles, toes): None, normal, Trunk Movements ?Neck, shoulders, hips: None, normal, Overall Severity ?Severity of abnormal movements (highest score from questions above): None, normal ?Incapacitation due to abnormal movements: None, normal ?Patient's awareness of abnormal movements (rate only patient's report): No Awareness, Dental Status ?Current problems with teeth and/or dentures?: No ?Does patient usually wear dentures?: No  ?CIWA:    ?COWS:    ? ?Musculoskeletal: ?Strength & Muscle Tone: within normal limits ?Gait & Station: normal, remains in bed ?Patient leans: N/A ? ?Psychiatric Specialty Exam: ? ?Presentation  ?General Appearance: -- (keeos self covered by blankets, but on medication administration appeared disheveled but similar to yesterday) ? ?Eye Contact:None ? ?Speech:Clear and Coherent (initally refused to speak) ? ?Speech  Volume:Increased ? ?Handedness:Right ? ? ?Mood and Affect  ?Mood:Angry ? ?Affect:-- (covers face, but on the few occasions seen it was congruent to mood) ? ? ?Thought Process  ?Thought Processes:Linear ? ?Descriptions of Associations:Circu

## 2021-10-01 NOTE — Group Note (Signed)
Recreation Therapy Group Note ? ? ?Group Topic:Health and Wellness  ?Group Date: 10/01/2021 ?Start Time: 1050 ?End Time: 1140 ?Facilitators: Victorino Sparrow, LRT,CTRS ?Location: [place] ? ? ?Goal Area(s) Addresses:  ?Patient will define components of whole wellness. ?Patient will verbalize benefit of whole wellness. ? ?Activity:  Music.  Due to the high acuity on the unit, LRT went room to room playing music for each patient in hopes it would calm the milieu and give patients a moment to relax, be calm and mental venture in a peaceful place in their minds.  ? ? ?Affect/Mood: N/A ?  ?Participation Level: Did not participate ?  ? ?Clinical Observations/Individualized Feedback:   ? ? ?Plan: Continue to engage patient in RT group sessions 2-3x/week. ? ? ?Victorino Sparrow, LRT,CTRS  ?10/01/2021 1:17 PM ?
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
## 2021-10-01 NOTE — Progress Notes (Signed)
Pt refused vitals 

## 2021-10-02 ENCOUNTER — Encounter (HOSPITAL_COMMUNITY): Payer: Self-pay

## 2021-10-02 MED ORDER — LORATADINE 10 MG PO TABS
10.0000 mg | ORAL_TABLET | Freq: Every day | ORAL | Status: DC
Start: 2021-10-02 — End: 2021-10-12
  Administered 2021-10-07 – 2021-10-12 (×4): 10 mg via ORAL
  Filled 2021-10-02 (×13): qty 1

## 2021-10-02 MED ORDER — CETAPHIL MOISTURIZING EX LOTN
TOPICAL_LOTION | Freq: Every day | CUTANEOUS | Status: DC
  Filled 2021-10-02: qty 473

## 2021-10-02 NOTE — Progress Notes (Signed)
Adult Psychoeducational Group Note ? ?Date:  10/02/2021 ?Time:  8:24 PM ? ?Group Topic/Focus:  ?Wrap-Up Group:   The focus of this group is to help patients review their daily goal of treatment and discuss progress on daily workbooks. ? ?Participation Level:  Did Not Attend ? ?Participation Quality:  Did Not Attend ? ?Affect:  Did Not Attend ? ?Cognitive:  Did Not Attend ? ?Insight: Did Not Attend ? ?Engagement in Group:  Did Not Attend ? ?Modes of Intervention:  Did Not Attend ? ?Additional Comments:   ?Pt was encouraged to attend group but refused ? ?Gerhard Perches ?10/02/2021, 8:24 PM ?

## 2021-10-02 NOTE — [facility] Group Notes (Signed)
?  Spirituality group facilitated by Kathrynn Humble, [place].  ? ?Group Description: Group focused on topic of hope. Patients participated in facilitated discussion around topic, connecting with one another around experiences and definitions for hope. Group members engaged with visual explorer photos, reflecting on what hope looks like for them today. Group engaged in discussion around how their definitions of hope are present today in hospital.  ? ?Modalities: Psycho-social ed, [place], Narrative, MI  ? ?Patient Progress: Kaitlyn Duncan was present at the beginning of group, but left.  She returned in the last few minutes of group and engaged in the conversation and interacted with peers. ? ?[employer], [facility] ?Pager, [phone] ?
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
## 2021-10-02 NOTE — BH IP Treatment Plan (Signed)
Interdisciplinary Treatment and Diagnostic Plan Update ? ?10/02/2021 ?Time of Session: update ?Kaitlyn Duncan ?MRN: [id number] ? ?Principal Diagnosis: Schizophrenia ([place]) ? ?Secondary Diagnoses: Principal Problem: ?  Schizophrenia ([place]) ? ? ?Current Medications:  ?Current Facility-Administered Medications  ?Medication Dose Route Frequency Provider Last Rate Last Admin  ? acetaminophen (TYLENOL) tablet 650 mg  650 mg Oral Q6H PRN White, Patrice L, NP      ? alum & mag hydroxide-simeth (MAALOX/MYLANTA) 200-200-20 MG/5ML suspension 30 mL  30 mL Oral Q4H PRN White, Patrice L, NP      ? benztropine mesylate (COGENTIN) injection 2 mg  2 mg Intramuscular BID PRN Massengill, Ovid Curd, MD      ? fluPHENAZine (PROLIXIN) injection 2.5 mg  2.5 mg Intramuscular BID WC Damita Dunnings B, MD   2.5 mg at 10/02/21 4270  ? Or  ? fluPHENAZine (PROLIXIN) tablet 5 mg  5 mg Oral BID WC Damita Dunnings B, MD      ? fluPHENAZine (PROLIXIN) tablet 10 mg  10 mg Oral QHS Damita Dunnings B, MD      ? Or  ? fluPHENAZine (PROLIXIN) injection 5 mg  5 mg Intramuscular QHS Damita Dunnings B, MD   5 mg at 10/01/21 2020  ? hydrOXYzine (ATARAX) tablet 25 mg  25 mg Oral TID PRN White, Patrice L, NP      ? magnesium hydroxide (MILK OF MAGNESIA) suspension 30 mL  30 mL Oral Daily PRN White, Patrice L, NP      ? OLANZapine (ZYPREXA) tablet 5 mg  5 mg Oral QHS Massengill, Nathan, MD      ? Or  ? OLANZapine (ZYPREXA) injection 5 mg  5 mg Intramuscular QHS Massengill, Ovid Curd, MD   5 mg at 10/01/21 2020  ? OLANZapine (ZYPREXA) injection 5 mg  5 mg Intramuscular Q8H PRN Massengill, Nathan, MD      ? OLANZapine (ZYPREXA) injection 5 mg  5 mg Intramuscular Daily Damita Dunnings B, MD   5 mg at 10/02/21 6237  ? Or  ? OLANZapine (ZYPREXA) tablet 5 mg  5 mg Oral Daily McQuilla, Jai B, MD      ? OLANZapine (ZYPREXA) tablet 5 mg  5 mg Oral Q8H PRN Massengill, Nathan, MD      ? traZODone (DESYREL) tablet 50 mg  50 mg Oral QHS PRN Janine Limbo, MD      ? ?PTA  Medications: ?No medications prior to admission.  ? ? ?Patient Stressors:   ? ?Patient Strengths:   ? ?Treatment Modalities: Medication Management, Group therapy, Case management,  ?1 to 1 session with clinician, Psychoeducation, Recreational therapy. ? ? ?Physician Treatment Plan for Primary Diagnosis: Schizophrenia ([place]) ?Long Term Goal(s): Improvement in symptoms so as ready for discharge  ? ?Short Term Goals: Ability to identify changes in lifestyle to reduce recurrence of condition will improve ?Ability to verbalize feelings will improve ?Ability to demonstrate self-control will improve ?Ability to identify and develop effective coping behaviors will improve ?Ability to maintain clinical measurements within normal limits will improve ?Compliance with prescribed medications will improve ?Ability to identify triggers associated with substance abuse/mental health issues will improve ? ?Medication Management: Evaluate patient's response, side effects, and tolerance of medication regimen. ? ?Therapeutic Interventions: 1 to 1 sessions, Unit Group sessions and Medication administration. ? ?Evaluation of Outcomes: Progressing ? ?Physician Treatment Plan for Secondary Diagnosis: Principal Problem: ?  Schizophrenia ([place]) ? ?Long Term Goal(s): Improvement in symptoms so as ready for discharge  ? ?Short Term Goals: Ability to identify  changes in lifestyle to reduce recurrence of condition will improve ?Ability to verbalize feelings will improve ?Ability to demonstrate self-control will improve ?Ability to identify and develop effective coping behaviors will improve ?Ability to maintain clinical measurements within normal limits will improve ?Compliance with prescribed medications will improve ?Ability to identify triggers associated with substance abuse/mental health issues will improve    ? ?Medication Management: Evaluate patient's response, side effects, and tolerance of medication regimen. ? ?Therapeutic Interventions:  1 to 1 sessions, Unit Group sessions and Medication administration. ? ?Evaluation of Outcomes: Progressing ? ? ?RN Treatment Plan for Primary Diagnosis: Schizophrenia ([place]) ?Long Term Goal(s): Knowledge of disease and therapeutic regimen to maintain health will improve ? ?Short Term Goals: Ability to remain free from injury will improve, Ability to verbalize frustration and anger appropriately will improve, Ability to demonstrate self-control, Ability to participate in decision making will improve, Ability to verbalize feelings will improve, Ability to disclose and discuss suicidal ideas, Ability to identify and develop effective coping behaviors will improve, and Compliance with prescribed medications will improve ? ?Medication Management: RN will administer medications as ordered by provider, will assess and evaluate patient's response and provide education to patient for prescribed medication. RN will report any adverse and/or side effects to prescribing provider. ? ?Therapeutic Interventions: 1 on 1 counseling sessions, Psychoeducation, Medication administration, Evaluate responses to treatment, Monitor vital signs and CBGs as ordered, Perform/monitor CIWA, COWS, AIMS and Fall Risk screenings as ordered, Perform wound care treatments as ordered. ? ?Evaluation of Outcomes: Progressing ? ? ?LCSW Treatment Plan for Primary Diagnosis: Schizophrenia ([place]) ?Long Term Goal(s): Safe transition to appropriate next level of care at discharge, Engage patient in therapeutic group addressing interpersonal concerns. ? ?Short Term Goals: Engage patient in aftercare planning with referrals and resources, Increase social support, Increase ability to appropriately verbalize feelings, Increase emotional regulation, Facilitate acceptance of mental health diagnosis and concerns, Facilitate patient progression through stages of change regarding substance use diagnoses and concerns, Identify triggers associated with mental  health/substance abuse issues, and Increase skills for wellness and recovery ? ?Therapeutic Interventions: Assess for all discharge needs, 1 to 1 time with Education officer, museum, Explore available resources and support systems, Assess for adequacy in community support network, Educate family and significant other(s) on suicide prevention, Complete Psychosocial Assessment, Interpersonal group therapy. ? ?Evaluation of Outcomes: Progressing ? ? ?Progress in Treatment: ?Attending groups: No. ?Participating in groups: No. No groups being held on the unit ?Taking medication as prescribed: No. ?Toleration medication: Yes. ?Family/Significant other contact made: Yes, havel contacted:  legal guardian ?Patient understands diagnosis: No. ?Discussing patient identified problems/goals with staff: Yes. ?Medical problems stabilized or resolved: Yes. ?Denies suicidal/homicidal ideation: Yes. ?Issues/concerns per patient self-inventory: No. ?  ?  ?New problem(s) identified: No, Describe:  none ?  ?New Short Term/Long Term Goal(s): medication stabilization, elimination of SI thoughts, development of comprehensive mental wellness plan.  ?  ?  ?Patient Goals:  "To get out" ?  ?Discharge Plan or Barriers: Patient is currently receiving ACTT services through [facility].  ?  ?Reason for Continuation of Hospitalization: Aggression ?Delusions  ?Hallucinations ?Medication stabilization ?  ?Estimated Length of Stay: 3-5 days ? ? ?Scribe for Treatment Team: ?Zachery Conch, LCSW ?10/02/2021 ?11:42 AM ?
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
## 2021-10-02 NOTE — Progress Notes (Addendum)
[facility] MD Progress Note ? ?10/02/2021 3:25 PM ?[name]  ?MRN:  [id number] ?Subjective:  Kaitlyn Duncan is a 51 yr old female who presented to on 3/22 to [facility] under IVC with paranoia, hallucinations, and delusions.  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 ?  ?  ?Case was discussed in the multidisciplinary team. MAR was reviewed and patient was not compliant with medications.  She refused all medications yesterday and required all her meds IM under the forced med order. ?-Forced Medication Over Objection Order Placed by Dr. Mamie Levers 3/26 ?- Prolixin  ?-- Current regimen TID dosing: 2.8m / 2.537m 19m84mIM if refueses PO, if willing to take PO regimen is 19mg70mmg 40mmg 619m- Change Zyprexa to 19mg PO71mily and 19mg QHS21m, for psychosis and anticholinergic affects w/ IM back up for forced meds ?  ?- Continue Trazodone 50mg QHS44m ?  ?- Continue cogentin 2 mg IM PRN for tremor or dystonic reaction  ? ?Objectively patient was noted to get up out of her bed and walk the halls briefly before returning to her room. Patient does not interact with patients but will ask staff what there name and credentials are. Patient remembers most staff name, but continues to not remember writers name or credentials. ? ?On assessment patient reports that she believes in [name] and is with [name] and he is with her and that [facility] endorses that she hears and see's things and endorses that they are very clear similar to how I talk to her. Patient endorses that she does believe killing oneself or others is a sin and does not want to do those things. Patient reports that she is upset about receiving the IM meds, but  reports she does not think she needs medications and would not take them by mouth.  ? ?Later in the day patient was observed responding to internal stimuli while sitting in her room. When provider approached, patient reported that the attending provider had done her "arbitration" and that she is leaving today. Patient was told that she was not leaving. ?Principal Problem: Schizophrenia (HCC) ?[place]: Principal Problem: ?  Schizophrenia (HCC) ? ?[place]ol Time spent with patient: 30 minutes ? ?Past Psychiatric History: See H&P ? ?Past Medical History:  ?Past Medical History:  ?Diagnosis Date  ? Allergy   ? Anemia   ? Gallstones   ? GERD (gastroesophageal reflux disease)   ? Hemorrhoids   ? melanoma with met dz dx'd 01/2018  ? brain and adrenal  ? Seasonal allergies   ?  ?Past Surgical History:  ?Procedure Laterality Date  ? BIOPSY  01/28/2018  ? Procedure: BIOPSY;  Surgeon: Edwards, Laurence Spatescation: [facility];  Service: Endoscopy;;  ? BREAST BIOPSY Left   ? ESOPHAGOGASTRODUODENOSCOPY N/A 01/28/2018  ? Procedure: ESOPHAGOGASTRODUODENOSCOPY (EGD);  Surgeon: Edwards, Laurence Spatescation: [facility];  Service: Endoscopy;  Laterality: N/A;  ? MOUTH SURGERY    ? OVARY SURGERY    ? removed "something"  ? ?Family History:  ?Family History  ?Problem Relation Age of Onset  ? Melanoma Mother   ? Hypertension Father   ? Breast cancer Maternal Aunt   ? Breast cancer Paternal  Aunt   ? Diabetes Paternal Aunt   ? Breast cancer Maternal Grandmother   ? Breast cancer Paternal Grandmother   ? Diabetes Paternal Grandmother   ? Colon cancer Neg Hx   ? Esophageal cancer Neg Hx   ? Pancreatic cancer Neg Hx   ? Stomach cancer Neg Hx   ? Liver disease Neg Hx   ? Rectal cancer Neg Hx   ? ?Family Psychiatric  History:  See H&P ?Social History:  ?Social History  ? ?Substance and Sexual Activity  ?Alcohol Use No  ?   ?Social History  ? ?Substance and Sexual Activity  ?Drug Use No  ?  ?Social History  ? ?Socioeconomic History  ? Marital  status: Single  ?  Spouse name: Not on file  ? Number of children: Not on file  ? Years of education: Not on file  ? Highest education level: Not on file  ?Occupational History  ? Not on file  ?Tobacco Use  ? Smoking status: Never  ? Smokeless tobacco: Never  ?Vaping Use  ? Vaping Use: Never used  ?Substance and Sexual Activity  ? Alcohol use: No  ? Drug use: No  ? Sexual activity: Not Currently  ?Other Topics Concern  ? Not on file  ?Social History Narrative  ? Not on file  ? ?Social Determinants of Health  ? ?Financial Resource Strain: Not on file  ?Food Insecurity: Not on file  ?Transportation Needs: Not on file  ?Physical Activity: Not on file  ?Stress: Not on file  ?Social Connections: Not on file  ? ?Additional Social History:  ?  ?  ?  ?  ?  ?  ?  ?  ?  ?  ?  ? ?Sleep: Good ? ?Appetite:  Fair ? ?Current Medications: ?Current Facility-Administered Medications  ?Medication Dose Route Frequency Provider Last Rate Last Admin  ? acetaminophen (TYLENOL) tablet 650 mg  650 mg Oral Q6H PRN White, Patrice L, NP      ? alum & mag hydroxide-simeth (MAALOX/MYLANTA) 200-200-20 MG/5ML suspension 30 mL  30 mL Oral Q4H PRN White, Patrice L, NP      ? benztropine mesylate (COGENTIN) injection 2 mg  2 mg Intramuscular BID PRN Massengill, Ovid Curd, MD      ? fluPHENAZine (PROLIXIN) injection 2.5 mg  2.5 mg Intramuscular BID WC Damita Dunnings B, MD   2.5 mg at 10/02/21 1248  ? Or  ? fluPHENAZine (PROLIXIN) tablet 5 mg  5 mg Oral BID WC Damita Dunnings B, MD      ? fluPHENAZine (PROLIXIN) tablet 10 mg  10 mg Oral QHS Damita Dunnings B, MD      ? Or  ? fluPHENAZine (PROLIXIN) injection 5 mg  5 mg Intramuscular QHS Damita Dunnings B, MD   5 mg at 10/01/21 2020  ? hydrOXYzine (ATARAX) tablet 25 mg  25 mg Oral TID PRN White, Patrice L, NP      ? magnesium hydroxide (MILK OF MAGNESIA) suspension 30 mL  30 mL Oral Daily PRN White, Patrice L, NP      ? OLANZapine (ZYPREXA) tablet 5 mg  5 mg Oral QHS Massengill, Nathan, MD      ? Or  ? OLANZapine  (ZYPREXA) injection 5 mg  5 mg Intramuscular QHS Massengill, Ovid Curd, MD   5 mg at 10/01/21 2020  ? OLANZapine (ZYPREXA) injection 5 mg  5 mg Intramuscular Q8H PRN Massengill, Nathan, MD      ? OLANZapine (ZYPREXA) injection 5 mg  5 mg Intramuscular Daily Damita Dunnings B, MD   5 mg at 10/02/21 7939  ? Or  ? OLANZapine (ZYPREXA) tablet 5 mg  5 mg Oral Daily Damita Dunnings B, MD      ? OLANZapine (ZYPREXA) tablet 5 mg  5 mg Oral Q8H PRN Massengill, Nathan, MD      ? traZODone (DESYREL) tablet 50 mg  50 mg Oral QHS PRN Massengill, Nathan, MD      ? ? ?Lab Results: No results found for this or any previous visit (from the past 48 hour(s)). ? ?Blood Alcohol level:  ?Lab Results  ?Component Value Date  ? ETH <10 09/23/2021  ? ETH <10 06/25/2021  ? ? ?Metabolic Disorder Labs: ?Lab Results  ?Component Value Date  ? HGBA1C 5.1 06/28/2021  ? MPG 99.67 06/28/2021  ? MPG 119.76 02/03/2018  ? ?No results found for: PROLACTIN ?Lab Results  ?Component Value Date  ? CHOL 212 (H) 06/28/2021  ? TRIG 56 06/28/2021  ? HDL 63 06/28/2021  ? CHOLHDL 3.4 06/28/2021  ? VLDL 11 06/28/2021  ? LDLCALC 138 (H) 06/28/2021  ? [place] 91 02/03/2018  ? ? ?Physical Findings: ?AIMS: Facial and Oral Movements ?Muscles of Facial Expression: None, normal ?Lips and Perioral Area: None, normal ?Jaw: None, normal ?Tongue: None, normal,Extremity Movements ?Upper (arms, wrists, hands, fingers): None, normal ?Lower (legs, knees, ankles, toes): None, normal, Trunk Movements ?Neck, shoulders, hips: None, normal, Overall Severity ?Severity of abnormal movements (highest score from questions above): None, normal ?Incapacitation due to abnormal movements: None, normal ?Patient's awareness of abnormal movements (rate only patient's report): No Awareness, Dental Status ?Current problems with teeth and/or dentures?: No ?Does patient usually wear dentures?: No  ?CIWA:    ?COWS:    ? ?Musculoskeletal: ?Strength & Muscle Tone: within normal limits ?Gait & Station:  normal ?Patient leans: N/A ? ?Psychiatric Specialty Exam: ? ?Presentation  ?General Appearance: Appropriate for Environment ? ?Eye Contact:Fair ? ?Speech:Clear and Coherent ? ?Speech Volume:Increased ? ?Handedn

## 2021-10-02 NOTE — Progress Notes (Signed)
Writer entered patients room and observed her lying in bed with her head covered with her blanket. She did uncover her head when writer introduced self to her and informed hero of scheduled medication. She immediately start yelling that she was not taking any medicine and we can't force her to take it. Writer informed that if she takes the pill shots will not be needed but she continued to yell at [employer] and shouting we are going to hell. IM medication was given since patient would not take PO medications.  ?

## 2021-10-02 NOTE — Group Note (Signed)
LCSW Group Therapy Note ? ? ?Group Date: 10/02/2021 ?Start Time: 1100 ?End Time: 1200 ? ? ?Type of Therapy and Topic:  Group Therapy: Problem Solving ? ?Participation Level:  Active ? ? ?Summary of Patient Progress: Due acuity on the unit group was not held. Patient was provided therapeutic worksheets and asked to meet with CSW to discuss those worksheets as needed.  ? ? ? ?Vassie Moselle, LCSW ?10/02/2021  2:25 PM   ? ?

## 2021-10-03 MED ORDER — PROPRANOLOL HCL ER 60 MG PO CP24
60.0000 mg | ORAL_CAPSULE | Freq: Every day | ORAL | Status: DC
  Filled 2021-10-03: qty 1

## 2021-10-03 NOTE — Progress Notes (Signed)
Adult Psychoeducational Group Note ? ?Date:  10/03/2021 ?Time:  8:49 PM ? ?Group Topic/Focus:  ?Wrap-Up Group:   The focus of this group is to help patients review their daily goal of treatment and discuss progress on daily workbooks. ? ?Participation Level:  Did Not Attend ? ?Participation Quality:   Did Not Attend ? ?Affect:   Did Not Attend ? ?Cognitive:   Did Not Attend ? ?Insight: None ? ?Engagement in Group:   Did Not Atttend ? ?Modes of Intervention:   Did Not Attend ? ?Additional Comments:  Pt was encouraged to attend wrap up group but did not attend ? ?[name] ?10/03/2021, 8:49 PM ?

## 2021-10-03 NOTE — [facility] Counselor (Signed)
Clinical Social Work Note ? ?At treatment team request, CSW contacted [facility] to determine if the patient is on their wait list.  They confirmed that she is indeed on the wait list at this time. ? ?Selmer Dominion, LCSW ?10/03/2021, 10:38 AM ?  ?

## 2021-10-03 NOTE — Progress Notes (Signed)
Patient refused having their vitals taken this evening. Patient informed Probation officer that they will not be taking their meds this evening because ?It has already been arranged! I was supposed to leave yesterday, She?s no longer here!(referring to themself). RN notified.
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
## 2021-10-03 NOTE — Progress Notes (Addendum)
[facility] MD Progress Note ? ?10/03/2021 1:43 PM ?Kaitlyn Duncan  ?MRN:  [id number] ?Subjective:   ?Kaitlyn Duncan is a 51 yr old female who presented to on 3/22 to [facility] under IVC with paranoia, hallucinations, and delusions.  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 ?  ?  ?Case was discussed in the multidisciplinary team. MAR was reviewed and patient was not compliant with medications.  She refused all medications yesterday and required all her meds IM under the forced med order. ?-Forced Medication Over Objection Order Placed by Dr. Mamie Levers 3/26 ?- Prolixin  ?-- Current regimen TID dosing: 2.5mg  / 2.5mg / 5mg   IM if refueses PO, if willing to take PO regimen is 5mg  /5mg  /10mg  ?  ?- Continue Zyprexa to 5mg  PO daily and 5mg  QHS PO, for psychosis and anticholinergic affects w/ IM back up for forced meds ?  ?- Continue Trazodone 50mg  QHS PRN ?  ?- Continue cogentin 2 mg IM PRN for tremor or dystonic reaction  ?-Continue Agitation Protocol: zyprexa 5 mg PO and IM PRN  ?  ?ON assessment this AM patient appeared far more pleasant. Patient denied SI, HI, and AVH. Patient reports she is eating and sleeping well. Patient reports that she does not believe she needs medications because "she is in a higher plane." Patient reports that she does not agree with her dx of schizophrenia "this is not a word used for me because I am the highest on the totem [pole]." Patient reports that she likes providers shoes and she likes her green shirt. Patient shows providers that she is cleaning her room because her sheets were dirty and that she is watching "Hocus Pocus 2" with the unit.  ? ?Patient appears much less bizarre and isolative. Patient  is ambulating on the unit but does not interact with others. Patient appeared to put some effort in to her appearance and was able to hold a very basic conversation and answer questions without being argumentative (to a point.) ? ?Principal Problem: Schizophrenia ([place]) ?Diagnosis: Principal Problem: ?  Schizophrenia ([place]) ? ?Total Time spent with patient: 20 minutes ? ?Past Psychiatric History: See H&P ? ?Past Medical History:  ?Past Medical History:  ?Diagnosis Date  ? Allergy   ? Anemia   ? Gallstones   ? GERD (gastroesophageal reflux disease)   ? Hemorrhoids   ? melanoma with met dz dx'd 01/2018  ? brain and adrenal  ? Seasonal allergies   ?  ?Past Surgical History:  ?Procedure Laterality Date  ? BIOPSY  01/28/2018  ? Procedure: BIOPSY;  Surgeon: Laurence Spates, MD;  Location: [facility];  Service: Endoscopy;;  ? BREAST BIOPSY Left   ? ESOPHAGOGASTRODUODENOSCOPY N/A 01/28/2018  ? Procedure: ESOPHAGOGASTRODUODENOSCOPY (EGD);  Surgeon: Laurence Spates, MD;  Location: [facility];  Service: Endoscopy;  Laterality: N/A;  ? MOUTH SURGERY    ? OVARY SURGERY    ? removed "something"  ? ?Family History:  ?Family History  ?Problem Relation Age of Onset  ? Melanoma Mother   ? Hypertension Father   ? Breast cancer Maternal Aunt   ? Breast cancer Paternal Aunt   ? Diabetes Paternal Aunt   ? Breast cancer Maternal Grandmother   ? Breast cancer Paternal Grandmother   ?  Diabetes Paternal Grandmother   ? Colon cancer Neg Hx   ? Esophageal cancer Neg Hx   ? Pancreatic cancer Neg Hx   ? Stomach cancer Neg Hx   ? Liver disease Neg Hx   ? Rectal cancer Neg Hx   ? ?Family Psychiatric  History:  See H&P ?Social History:  ?Social History  ? ?Substance and Sexual Activity  ?Alcohol Use No  ?   ?Social History  ? ?Substance and Sexual Activity  ?Drug Use No  ?  ?Social History  ? ?Socioeconomic History  ? Marital status: Single  ?  Spouse name: Not on file  ? Number of children: Not on file  ? Years of education: Not on file  ? Highest  education level: Not on file  ?Occupational History  ? Not on file  ?Tobacco Use  ? Smoking status: Never  ? Smokeless tobacco: Never  ?Vaping Use  ? Vaping Use: Never used  ?Substance and Sexual Activity  ? Alcohol use: No  ? Drug use: No  ? Sexual activity: Not Currently  ?Other Topics Concern  ? Not on file  ?Social History Narrative  ? Not on file  ? ?Social Determinants of Health  ? ?Financial Resource Strain: Not on file  ?Food Insecurity: Not on file  ?Transportation Needs: Not on file  ?Physical Activity: Not on file  ?Stress: Not on file  ?Social Connections: Not on file  ? ?Additional Social History:  ?  ?  ?  ?  ?  ?  ?  ?  ?  ?  ?  ? ?Sleep: Good ? ?Appetite:  Good ? ?Current Medications: ?Current Facility-Administered Medications  ?Medication Dose Route Frequency Provider Last Rate Last Admin  ? acetaminophen (TYLENOL) tablet 650 mg  650 mg Oral Q6H PRN White, Patrice L, NP      ? alum & mag hydroxide-simeth (MAALOX/MYLANTA) 200-200-20 MG/5ML suspension 30 mL  30 mL Oral Q4H PRN White, Patrice L, NP      ? benztropine mesylate (COGENTIN) injection 2 mg  2 mg Intramuscular BID PRN Massengill, Ovid Curd, MD      ? cetaphil lotion   Topical Daily Damita Dunnings B, MD      ? fluPHENAZine (PROLIXIN) injection 2.5 mg  2.5 mg Intramuscular BID WC Damita Dunnings B, MD   2.5 mg at 10/03/21 7628  ? Or  ? fluPHENAZine (PROLIXIN) tablet 5 mg  5 mg Oral BID WC Damita Dunnings B, MD   5 mg at 10/03/21 1301  ? fluPHENAZine (PROLIXIN) tablet 10 mg  10 mg Oral QHS Damita Dunnings B, MD      ? Or  ? fluPHENAZine (PROLIXIN) injection 5 mg  5 mg Intramuscular QHS Damita Dunnings B, MD   5 mg at 10/02/21 2103  ? hydrOXYzine (ATARAX) tablet 25 mg  25 mg Oral TID PRN White, Patrice L, NP      ? loratadine (CLARITIN) tablet 10 mg  10 mg Oral Daily Damita Dunnings B, MD      ? magnesium hydroxide (MILK OF MAGNESIA) suspension 30 mL  30 mL Oral Daily PRN White, Patrice L, NP      ? OLANZapine (ZYPREXA) tablet 5 mg  5 mg Oral QHS Massengill,  Nathan, MD      ? Or  ? OLANZapine (ZYPREXA) injection 5 mg  5 mg Intramuscular QHS Massengill, Ovid Curd, MD   5 mg at 10/02/21 2102  ? OLANZapine (ZYPREXA) injection 5 mg  5 mg Intramuscular Q8H PRN  Massengill, Ovid Curd, MD      ? OLANZapine [facility]) injection 5 mg  5 mg Intramuscular Daily Damita Dunnings B, MD   5 mg at 10/03/21 0931  ? Or  ? OLANZapine (ZYPREXA) tablet 5 mg  5 mg Oral Daily Damita Dunnings B, MD      ? OLANZapine (ZYPREXA) tablet 5 mg  5 mg Oral Q8H PRN Massengill, Nathan, MD      ? traZODone (DESYREL) tablet 50 mg  50 mg Oral QHS PRN Massengill, Nathan, MD      ? ? ?Lab Results: No results found for this or any previous visit (from the past 48 hour(s)). ? ?Blood Alcohol level:  ?Lab Results  ?Component Value Date  ? ETH <10 09/23/2021  ? ETH <10 06/25/2021  ? ? ?Metabolic Disorder Labs: ?Lab Results  ?Component Value Date  ? HGBA1C 5.1 06/28/2021  ? MPG 99.67 06/28/2021  ? MPG 119.76 02/03/2018  ? ?No results found for: PROLACTIN ?Lab Results  ?Component Value Date  ? CHOL 212 (H) 06/28/2021  ? TRIG 56 06/28/2021  ? HDL 63 06/28/2021  ? CHOLHDL 3.4 06/28/2021  ? VLDL 11 06/28/2021  ? LDLCALC 138 (H) 06/28/2021  ? [place] 91 02/03/2018  ? ? ?Physical Findings: ?AIMS: Facial and Oral Movements ?Muscles of Facial Expression: None, normal ?Lips and Perioral Area: None, normal ?Jaw: None, normal ?Tongue: None, normal,Extremity Movements ?Upper (arms, wrists, hands, fingers): None, normal ?Lower (legs, knees, ankles, toes): None, normal, Trunk Movements ?Neck, shoulders, hips: None, normal, Overall Severity ?Severity of abnormal movements (highest score from questions above): None, normal ?Incapacitation due to abnormal movements: None, normal ?Patient's awareness of abnormal movements (rate only patient's report): No Awareness, Dental Status ?Current problems with teeth and/or dentures?: No ?Does patient usually wear dentures?: No  ?CIWA:    ?COWS:    ? ?Musculoskeletal: ?Strength & Muscle Tone: within  normal limits ?Gait & Station: normal ?Patient leans: N/A ? ?Psychiatric Specialty Exam: ? ?Presentation  ?General Appearance: Appropriate for Environment; Casual (wearing  a new clean green shirt and scrub

## 2021-10-03 NOTE — [facility] Group Notes (Addendum)
Group Date: 10/03/2021 ?Start Time: 11:30am ?End Time: 12:00pm ?  ?  ?Type of Therapy and Topic:  Group Therapy: Anger ?  ?Participation Level:  Active ?  ?Description of Group:   Due to the acuity on the unit, staff recommended no close contact at this time so group was not held.  Patient was provided with written information and worksheets about anger.  CSW could not go over information with the patient, as she was  hyper-focused on getting out of the hospital.  She insisted that she is here of her own free will and can leave when she wants to, citing Dr. Caswell Corwin as the person who told her this.  CSW refused to agree with her, left as soon as possible when she kept insisting. ?  ?  ?  ?Selmer Dominion, LCSW ?10/03/2021 ?

## 2021-10-03 NOTE — [facility] Group Notes (Signed)
Patient did not attend the goals group. 

## 2021-10-04 MED ORDER — PROPRANOLOL HCL ER 60 MG PO CP24
60.0000 mg | ORAL_CAPSULE | Freq: Every day | ORAL | Status: DC
  Filled 2021-10-04 (×11): qty 1

## 2021-10-04 MED ORDER — OLANZAPINE 10 MG IM SOLR
INTRAMUSCULAR | Status: AC
  Administered 2021-10-04: 5 mg via INTRAMUSCULAR
  Filled 2021-10-04: qty 10

## 2021-10-04 NOTE — Progress Notes (Signed)
[facility] MD Progress Note ? ?10/04/2021 2:16 PM ?Kaitlyn Duncan  ?MRN:  [id number] ?Subjective:   ?Kaitlyn Duncan is a 50 yr old female who presented to on 3/22 to [facility] under IVC with paranoia, hallucinations, and delusions.  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 ?  ?  ?Case was discussed in the multidisciplinary team. MAR was reviewed and patient was not compliant with medications.  She refused all medications yesterday and required all her meds IM under the forced med order. ?-Forced Medication Over Objection Order Placed by Dr. Massengil 3/26 ?- Prolixin  ?-- Current regimen TID dosing: 2.5mg / 2.5mg/ 5mg  IM if refueses PO, if willing to take PO regimen is 5mg /5mg /10mg ?  ?- Continue Zyprexa to 5mg PO daily and 5mg QHS PO, for psychosis and anticholinergic affects w/ IM back up for forced meds ?  ?- Continue Trazodone 50mg QHS PRN ?  ?- Continue cogentin 2 mg IM PRN for tremor or dystonic reaction  ?-Continue Agitation Protocol: zyprexa 5 mg PO and IM PRN  ?- Start Propanolol 10mg QHS ?On assessment today patient reports she is doing "all right."  Patient reports that she enjoyed watching church with the other patients in the day room this a.m.  Patient requested to have new bottoms and underwear because she has a history of incontinence.  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  On assessment today patient denies SI, HI and AVH and is adamant that she is not going to take any medications willingly. ? ? ?Principal Problem: Schizophrenia ([facility]) ?Diagnosis: Principal Problem: ?  Schizophrenia ([facility]) ? ?Total Time spent with patient: 20 minutes ? ?Past Psychiatric History: See H&P ? ?Past Medical History:  ?Past Medical History:  ?Diagnosis Date  ? Allergy   ? Anemia   ? Gallstones   ? GERD (gastroesophageal reflux disease)   ? Hemorrhoids   ? melanoma with met dz dx'd 01/2018  ? brain and adrenal  ? Seasonal allergies   ?  ?Past Surgical History:  ?Procedure Laterality Date  ? BIOPSY  01/28/2018  ? Procedure: BIOPSY;  Surgeon: Edwards, James, MD;  Location: [facility];  Service: Endoscopy;;  ? BREAST BIOPSY Left   ? ESOPHAGOGASTRODUODENOSCOPY N/A 01/28/2018  ? Procedure: ESOPHAGOGASTRODUODENOSCOPY (EGD);  Surgeon: Edwards, James, MD;  Location: [facility];  Service: Endoscopy;  Laterality: N/A;  ? MOUTH SURGERY    ? OVARY SURGERY    ? removed "something"  ? ?Family History:  ?Family History  ?Problem Relation Age of Onset  ? Melanoma Mother   ? Hypertension Father   ? Breast cancer Maternal Aunt   ? Breast cancer Paternal Aunt   ?   Diabetes Paternal Aunt   ? Breast cancer Maternal Grandmother   ? Breast cancer Paternal Grandmother   ? Diabetes Paternal Grandmother   ? Colon cancer Neg Hx   ? Esophageal cancer Neg Hx   ? Pancreatic cancer Neg Hx   ? Stomach cancer Neg Hx   ? Liver disease Neg Hx   ? Rectal cancer Neg Hx   ? ?Family Psychiatric  History: See H&P ?Social History:  ?Social History  ? ?Substance and Sexual Activity  ?Alcohol Use No  ?   ?Social History  ? ?Substance and Sexual Activity  ?Drug Use No  ?  ?Social History  ? ?Socioeconomic History  ? Marital status: Single  ?  Spouse name: Not on file  ?  Number of children: Not on file  ? Years of education: Not on file  ? Highest education level: Not on file  ?Occupational History  ? Not on file  ?Tobacco Use  ? Smoking status: Never  ? Smokeless tobacco: Never  ?Vaping Use  ? Vaping Use: Never used  ?Substance and Sexual Activity  ? Alcohol use: No  ? Drug use: No  ? Sexual activity: Not Currently  ?Other Topics Concern  ? Not on file  ?Social History Narrative  ? Not on file  ? ?Social Determinants of Health  ? ?Financial Resource Strain: Not on file  ?Food Insecurity: Not on file  ?Transportation Needs: Not on file  ?Physical Activity: Not on file  ?Stress: Not on file  ?Social Connections: Not on file  ? ?Additional Social History:  ?  ?  ?  ?  ?  ?  ?  ?  ?  ?  ?  ? ?Sleep: Good ? ?Appetite:  Good ? ?Current Medications: ?Current Facility-Administered Medications  ?Medication Dose Route Frequency Provider Last Rate Last Admin  ? acetaminophen (TYLENOL) tablet 650 mg  650 mg Oral Q6H PRN White, Patrice L, NP      ? alum & mag hydroxide-simeth (MAALOX/MYLANTA) 200-200-20 MG/5ML suspension 30 mL  30 mL Oral Q4H PRN White, Patrice L, NP      ? benztropine mesylate (COGENTIN) injection 2 mg  2 mg Intramuscular BID PRN Massengill, Ovid Curd, MD      ? cetaphil lotion   Topical Daily Damita Dunnings B, MD      ? fluPHENAZine (PROLIXIN) injection 2.5 mg  2.5 mg Intramuscular BID WC Damita Dunnings B, MD   2.5 mg at 10/04/21 1246  ? Or  ? fluPHENAZine (PROLIXIN) tablet 5 mg  5 mg Oral BID WC Damita Dunnings B, MD   5 mg at 10/03/21 1301  ? fluPHENAZine (PROLIXIN) tablet 10 mg  10 mg Oral QHS Damita Dunnings B, MD      ? Or  ? fluPHENAZine (PROLIXIN) injection 5 mg  5 mg Intramuscular QHS Damita Dunnings B, MD   5 mg at 10/03/21 2014  ? hydrOXYzine (ATARAX) tablet 25 mg  25 mg Oral TID PRN White, Patrice L, NP      ? loratadine (CLARITIN) tablet 10 mg  10 mg Oral Daily Damita Dunnings B, MD      ? magnesium hydroxide (MILK OF MAGNESIA) suspension 30 mL  30 mL Oral Daily PRN White,  Patrice L, NP      ? OLANZapine (ZYPREXA) tablet 5 mg  5 mg Oral QHS Massengill, Nathan, MD      ? Or  ? OLANZapine (ZYPREXA) injection 5 mg  5 mg Intramuscular QHS Janine Limbo, MD  5 mg at 10/03/21 2015  ? OLANZapine (ZYPREXA) injection 5 mg  5 mg Intramuscular Q8H PRN Massengill, Nathan, MD      ? OLANZapine (ZYPREXA) injection 5 mg  5 mg Intramuscular Daily ,  B, MD   5 mg at 10/04/21 0859  ? Or  ? OLANZapine (ZYPREXA) tablet 5 mg  5 mg Oral Daily ,  B, MD      ? OLANZapine (ZYPREXA) tablet 5 mg  5 mg Oral Q8H PRN Massengill, Nathan, MD      ? propranolol ER (INDERAL LA) 24 hr capsule 60 mg  60 mg Oral Daily Maurer, Sheila M, MD      ? traZODone (DESYREL) tablet 50 mg  50 mg Oral QHS PRN Massengill, Nathan, MD      ? ? ?Lab Results: No results found for this or any previous visit (from the past 48 hour(s)). ? ?Blood Alcohol level:  ?Lab Results  ?Component Value Date  ? ETH <10 09/23/2021  ? ETH <10 06/25/2021  ? ? ?Metabolic Disorder Labs: ?Lab Results  ?Component Value Date  ? HGBA1C 5.1 06/28/2021  ? MPG 99.67 06/28/2021  ? MPG 119.76 02/03/2018  ? ?No results found for: PROLACTIN ?Lab Results  ?Component Value Date  ? CHOL 212 (H) 06/28/2021  ? TRIG 56 06/28/2021  ? HDL 63 06/28/2021  ? CHOLHDL 3.4 06/28/2021  ? VLDL 11 06/28/2021  ? LDLCALC 138 (H) 06/28/2021  ? LDLCALC 91 02/03/2018  ? ? ?Physical Findings: ?AIMS: Facial and Oral Movements ?Muscles of Facial Expression: None, normal ?Lips and Perioral Area: None, normal ?Jaw: None, normal ?Tongue: None, normal,Extremity Movements ?Upper (arms, wrists, hands, fingers): None, normal ?Lower (legs, knees, ankles, toes): None, normal, Trunk Movements ?Neck, shoulders, hips: None, normal, Overall Severity ?Severity of abnormal movements (highest score from questions above): None, normal ?Incapacitation due to abnormal movements: None, normal ?Patient's awareness of abnormal movements (rate only patient's report): No Awareness, Dental  Status ?Current problems with teeth and/or dentures?: No ?Does patient usually wear dentures?: No  ?CIWA:    ?COWS:    ? ?Musculoskeletal: ?Strength & Muscle Tone: within normal limits ?Gait & Station: no

## 2021-10-04 NOTE — Progress Notes (Addendum)
D: Patient observed lying in bed with covers over her head. She refused to uncover her head when this nurse came in to introduce herself. I brought her po medications to her hoping she would take them over the forced injection. Patient uncovered her head and states, "I have to go to the bathroom." Patient proceeded to BR and stayed. I informed her that I would return in a few minutes. I waited outside the room and when patient came out of the BR, I again approached her with the medication. She refused to take any of it. She was asked three different times. IM injections were drawn up and approached patient again. She states, "I'll take them by mouth now." Informed her that no, she will now need to take the injections as she refused three different times. Patient took the injections. When her 1200 medication was due, she attempted to run to the bathroom again. With the assistance of another nurse, IM injection was given to patient. She remains religiously preoccupied. When injection was given to patient she yelled, "You Itch Witch. You need to stop doing this." Patient remains uncooperative and combative at times with staff. ? ?A: Continue to monitor medication management and MD orders.  Safety checks completed every 15 minutes per protocol.  Offer support and encouragement as needed. ? ?R: Patient remains uncooperative with staff. She continues on forced med order.  ? 10/04/21 1300  ?Psych Admission Type (Psych Patients Only)  ?Admission Status Involuntary  ?Psychosocial Assessment  ?Patient Complaints Irritability  ?Eye Contact Avoids  ?Facial Expression Angry;Sullen  ?Affect Irritable;Labile  ?Speech Aggressive;Argumentative  ?Interaction Hostile  ?Motor Activity Restless  ?Appearance/Hygiene Poor hygiene;Disheveled  ?Behavior Characteristics Unable to participate  ?Mood Labile;Angry  ?Aggressive Behavior  ?Type of Behavior Threatening  ?Effect No apparent injury  ?Thought Process  ?Coherency Disorganized   ?Content Blaming others;Delusions  ?Delusions Religious;Paranoid  ?Perception Derealization  ?Hallucination None reported or observed  ?Judgment Poor  ?Confusion Moderate  ?Danger to Self  ?Current suicidal ideation? Denies  ?Danger to Others  ?Danger to Others Reported or observed  ?Danger to Others Abnormal  ?Harmful Behavior to others Threats of violence towards other people observed or expressed   ?Destructive Behavior Threats of violence towards property observed or expressed   ?Description of Harmful Behavior threatening to hit staff  ? ? ?

## 2021-10-04 NOTE — Group Note (Signed)
LCSW Group Therapy ? ? ?CSW group not facilitated due to unit limitations on patient proximity/interactions due to infection prevention measures. ? ?Baird Kay LCSWA  ?10:37 AM ? ?

## 2021-10-05 MED ORDER — OLANZAPINE 10 MG IM SOLR
7.5000 mg | Freq: Every day | INTRAMUSCULAR | Status: DC
Start: 2021-10-05 — End: 2021-10-06
  Administered 2021-10-05: 7.5 mg via INTRAMUSCULAR
  Filled 2021-10-05 (×3): qty 10

## 2021-10-05 MED ORDER — OLANZAPINE 7.5 MG PO TABS
7.5000 mg | ORAL_TABLET | Freq: Every day | ORAL | Status: DC
  Filled 2021-10-05 (×3): qty 1

## 2021-10-05 NOTE — Progress Notes (Signed)
[facility] MD Progress Note ? ?10/05/2021 4:01 PM ?Kaitlyn Duncan  ?MRN:  [id number] ?Subjective:  Kaitlyn Duncan is a 51 yr old female who presented to on 3/22 to [facility] under IVC with paranoia, hallucinations, and delusions.  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 ?  ?  ?Case was discussed in the multidisciplinary team. MAR was reviewed and patient was not compliant with medications.  She refused all medications yesterday and required all her meds IM under the forced med order. ?-Forced Medication Over Objection Order Placed by Dr. Mamie Levers 3/26 ?- Prolixin  ?-- Current regimen TID dosing: 2.5mg  / 2.5mg / 5mg   IM if refueses PO, if willing to take PO regimen is 5mg  /5mg  /10mg  ?  ?- Continue Zyprexa to 5mg  PO daily and 5mg  QHS PO, for psychosis and anticholinergic affects w/ IM back up for forced meds ?  ?- Continue Trazodone 50mg  QHS PRN ?  ?- Continue cogentin 2 mg IM PRN for tremor or dystonic reaction  ?-Continue Agitation Protocol: zyprexa 5 mg PO and IM PRN  ?- Start Propanolol 10mg  QHS ? ?On assessment this a.m. patient reports that she slept very well and has a good appetite.  Patient denies SI, HI and AVH.  Patient reports that she has no intentions of taking her medication because she is on the higher realm.  Patient also endorses that she is under the impression that she is being discharged today and should have been discharged yesterday. ? ? ?Principal Problem: Schizophrenia ([place]) ?Diagnosis: Principal Problem: ?  Schizophrenia ([place]) ? ?Total Time spent with patient: 15 minutes ? ?Past Psychiatric History: See H&P ? ?Past Medical History:  ?Past Medical History:  ?Diagnosis Date  ? Allergy   ? Anemia   ?  Gallstones   ? GERD (gastroesophageal reflux disease)   ? Hemorrhoids   ? melanoma with met dz dx'd 01/2018  ? brain and adrenal  ? Seasonal allergies   ?  ?Past Surgical History:  ?Procedure Laterality Date  ? BIOPSY  01/28/2018  ? Procedure: BIOPSY;  Surgeon: Laurence Spates, MD;  Location: [facility];  Service: Endoscopy;;  ? BREAST BIOPSY Left   ? ESOPHAGOGASTRODUODENOSCOPY N/A 01/28/2018  ? Procedure: ESOPHAGOGASTRODUODENOSCOPY (EGD);  Surgeon: Laurence Spates, MD;  Location: [facility];  Service: Endoscopy;  Laterality: N/A;  ? MOUTH SURGERY    ? OVARY SURGERY    ? removed "something"  ? ?Family History:  ?Family History  ?Problem Relation Age of Onset  ? Melanoma Mother   ? Hypertension Father   ? Breast cancer Maternal Aunt   ? Breast cancer Paternal Aunt   ? Diabetes Paternal Aunt   ? Breast cancer Maternal Grandmother   ? Breast cancer Paternal Grandmother   ? Diabetes Paternal Grandmother   ? Colon cancer Neg Hx   ? Esophageal cancer Neg Hx   ? Pancreatic cancer Neg Hx   ? Stomach cancer Neg Hx   ? Liver disease Neg Hx   ? Rectal cancer Neg Hx   ? ?Family Psychiatric  History: See H&P ?Social History:  ?Social History  ? ?Substance and Sexual Activity  ?Alcohol Use No  ?   ?Social History  ? ?Substance and Sexual Activity  ?Drug Use No  ?  ?  Social History  ? ?Socioeconomic History  ? Marital status: Single  ?  Spouse name: Not on file  ? Number of children: Not on file  ? Years of education: Not on file  ? Highest education level: Not on file  ?Occupational History  ? Not on file  ?Tobacco Use  ? Smoking status: Never  ? Smokeless tobacco: Never  ?Vaping Use  ? Vaping Use: Never used  ?Substance and Sexual Activity  ? Alcohol use: No  ? Drug use: No  ? Sexual activity: Not Currently  ?Other Topics Concern  ? Not on file  ?Social History Narrative  ? Not on file  ? ?Social Determinants of Health  ? ?Financial Resource Strain: Not on file  ?Food Insecurity: Not on file  ?Transportation Needs: Not on file   ?Physical Activity: Not on file  ?Stress: Not on file  ?Social Connections: Not on file  ? ?Additional Social History:  ?  ?  ?  ?  ?  ?  ?  ?  ?  ?  ?  ? ?Sleep: Good ? ?Appetite:  Good ? ?Current Medications: ?Current Facility-Administered Medications  ?Medication Dose Route Frequency Provider Last Rate Last Admin  ? acetaminophen (TYLENOL) tablet 650 mg  650 mg Oral Q6H PRN White, Patrice L, NP      ? alum & mag hydroxide-simeth (MAALOX/MYLANTA) 200-200-20 MG/5ML suspension 30 mL  30 mL Oral Q4H PRN White, Patrice L, NP      ? benztropine mesylate (COGENTIN) injection 2 mg  2 mg Intramuscular BID PRN Massengill, Ovid Curd, MD      ? cetaphil lotion   Topical Daily Damita Dunnings B, MD      ? fluPHENAZine (PROLIXIN) injection 2.5 mg  2.5 mg Intramuscular BID WC Damita Dunnings B, MD   2.5 mg at 10/05/21 1246  ? Or  ? fluPHENAZine (PROLIXIN) tablet 5 mg  5 mg Oral BID WC Damita Dunnings B, MD   5 mg at 10/03/21 1301  ? fluPHENAZine (PROLIXIN) tablet 10 mg  10 mg Oral QHS Damita Dunnings B, MD      ? Or  ? fluPHENAZine (PROLIXIN) injection 5 mg  5 mg Intramuscular QHS Damita Dunnings B, MD   5 mg at 10/04/21 2103  ? hydrOXYzine (ATARAX) tablet 25 mg  25 mg Oral TID PRN White, Patrice L, NP      ? loratadine (CLARITIN) tablet 10 mg  10 mg Oral Daily Doren Kaspar, Joyce Gross B, MD      ? magnesium hydroxide (MILK OF MAGNESIA) suspension 30 mL  30 mL Oral Daily PRN White, Patrice L, NP      ? OLANZapine (ZYPREXA) injection 5 mg  5 mg Intramuscular Q8H PRN Massengill, Nathan, MD      ? OLANZapine (ZYPREXA) injection 5 mg  5 mg Intramuscular Daily Damita Dunnings B, MD   5 mg at 10/05/21 4097  ? Or  ? OLANZapine (ZYPREXA) tablet 5 mg  5 mg Oral Daily Damita Dunnings B, MD      ? OLANZapine (ZYPREXA) tablet 7.5 mg  7.5 mg Oral QHS Massengill, Nathan, MD      ? Or  ? OLANZapine (ZYPREXA) injection 7.5 mg  7.5 mg Intramuscular QHS Massengill, Nathan, MD      ? OLANZapine (ZYPREXA) tablet 5 mg  5 mg Oral Q8H PRN Massengill, Nathan, MD      ?  propranolol ER (INDERAL LA) 24 hr capsule 60 mg  60 mg Oral Daily Lavella Hammock,  MD      ? traZODone (DESYREL) tablet 50 mg  50 mg Oral QHS PRN Massengill, Nathan, MD      ? ? ?Lab Results: No results found for this or any previous visit (from the past 48 hour(s)). ? ?Blood Alcohol level:  ?Lab Results  ?Component Value Date  ? ETH <10 09/23/2021  ? ETH <10 06/25/2021  ? ? ?Metabolic Disorder Labs: ?Lab Results  ?Component Value Date  ? HGBA1C 5.1 06/28/2021  ? MPG 99.67 06/28/2021  ? MPG 119.76 02/03/2018  ? ?No results found for: PROLACTIN ?Lab Results  ?Component Value Date  ? CHOL 212 (H) 06/28/2021  ? TRIG 56 06/28/2021  ? HDL 63 06/28/2021  ? CHOLHDL 3.4 06/28/2021  ? VLDL 11 06/28/2021  ? LDLCALC 138 (H) 06/28/2021  ? [place] 91 02/03/2018  ? ? ?Physical Findings: ?AIMS: Facial and Oral Movements ?Muscles of Facial Expression: None, normal ?Lips and Perioral Area: None, normal ?Jaw: None, normal ?Tongue: None, normal,Extremity Movements ?Upper (arms, wrists, hands, fingers): None, normal ?Lower (legs, knees, ankles, toes): None, normal, Trunk Movements ?Neck, shoulders, hips: None, normal, Overall Severity ?Severity of abnormal movements (highest score from questions above): None, normal ?Incapacitation due to abnormal movements: None, normal ?Patient's awareness of abnormal movements (rate only patient's report): No Awareness, Dental Status ?Current problems with teeth and/or dentures?: No ?Does patient usually wear dentures?: No  ?CIWA:    ?COWS:    ? ?Musculoskeletal: ?Strength & Muscle Tone: within normal limits ?Gait & Station: normal ?Patient leans: N/A ? ?Psychiatric Specialty Exam: ? ?Presentation  ?General Appearance: Appropriate for Environment; Casual ? ?Eye Contact:Fair ? ?Speech:Clear and Coherent ? ?Speech Volume:Normal ? ?Handedness:Right ? ? ?Mood and Affect  ?Mood:Euthymic ? ?Affect:Appropriate ? ? ?Thought Process  ?Thought Processes:Linear ? ?Descriptions of  Associations:Circumstantial ? ?Orientation:Full (Time, Place and Person) ? ?Thought Content:Illogical; Delusions ? ?History of Schizophrenia/Schizoaffective disorder:Yes ? ?Duration of Psychotic Symptoms:Greater than six months ? ?Hallucinations:Lamonte Sakai

## 2021-10-05 NOTE — Progress Notes (Signed)
Patient continues to be non compliant with routine care. Patient stays in room and does not want to participate in any activity, the staff had to force medication by injection on patient. Q 15 minutes safety observation in place. Staff will continue to monitor.  ? 10/05/21 0800  ?Psych Admission Type (Psych Patients Only)  ?Admission Status Involuntary  ?Psychosocial Assessment  ?Patient Complaints Irritability;Isolation;Anger  ?Eye Contact Avoids  ?Facial Expression Angry;Anxious  ?Affect Irritable;Preoccupied  ?Speech Aggressive;Loud;Argumentative  ?Interaction Assertive;Hostile;Isolative  ?Motor Activity Slow  ?Appearance/Hygiene Disheveled  ?Behavior Characteristics Unable to participate;Anxious;Agitated;Combative;Irritable  ?Mood Anxious;Preoccupied  ?Aggressive Behavior  ?Type of Behavior Threatening  ?Effect No apparent injury  ?Thought Process  ?Coherency Disorganized  ?Content Preoccupation  ?Delusions Grandeur;Religious  ?Perception Derealization  ?Hallucination None reported or observed  ?Judgment Poor  ?Confusion Moderate  ?Danger to Self  ?Current suicidal ideation? Denies  ?Danger to Others  ?Danger to Others None reported or observed  ?Danger to Others Abnormal  ?Harmful Behavior to others Threats of violence towards other people observed or expressed   ?Destructive Behavior Threats of violence towards property observed or expressed   ?Description of Harmful Behavior Threatning staff  ? ? ?

## 2021-10-05 NOTE — Progress Notes (Signed)
Patient has been isolative to her room. She was informed of her medications and reported that she would take the pills. Once medication was brought to her room writer asked that she sit up so she could take her medications. She asked Probation officer about if I was a Engineer, manufacturing and her beliefs on not taking medications or vaccines. Writer gave her one of her pills with water. She took the pill and dropped it in the water and reported that she was not taking it. Writer informed her that she has that right but once a forced med order is in place she has to take pills or IM. She started yelling at writer that I didn't know what I was talking about and I'm going to hell. Get out of my room !!! Medications had to be given IM. ?

## 2021-10-05 NOTE — Progress Notes (Signed)
[facility] Second Physician Opinion Progress Note for Medication Administration to Non-consenting Patients (For Involuntarily Committed Patients) ? ?Patient: Kaitlyn Duncan ?Date of Birth: [id number] ?MRN: [id number] ? ?Reason for the Medication: The patient, without the benefit of the specific treatment measure, is incapable of participating in any available treatment plan that will give the patient a realistic opportunity of improving the patient's condition. There is, without the benefit of the specific treatment measure, a significant possibility that the patient will harm self or others before improvement of the patient's condition is realized. ? ?Consideration of Side Effects: Consideration of the side effects related to the medication plan has been given. ? ?Consultation for forced medication protocol was requested by Dr. Caswell Corwin because the patient is not consistently taking her medication and has required forced medication over objection protocol during admission with this protocol expiring today. There is concern that she will psychiatrically decompensate if forced medication protocol is discontinued given the fact that she is still not taking po medications at this time. ? ?I met with this patient in her room.  On interview the patient cannot explain her reason for admission, cannot articulate an understanding of her psychiatric diagnosis, does not believe she has a psychiatric condition, and cannot discuss risks/benefits to her medications. She is irritable, delusional, and paranoid on exam. She makes frequent hyper-religious references on exam and states she has the power to send "people to [place] [employer]." She states that [name] has made her a prophet and has given her powers. She is paranoid about law enforcement and about her guardian and is delusional that she does not have a guardian.She appears disheveled on exam with poor hygiene. ? ?The patient currently is too symptomatic to engage in  meaningful conversations regarding the treatment of her psychiatric illness and is unlikely to improve without administration of forced medications.  She is not able to participate in a meaningful way in decisions regarding medications or treatment. At this time I am in agreement that the patient needs to continue an antipsychotic in order to have a realistic expectation for improving her paranoia and delusional thinking and for managing her residual psychosis. It is my opinion that should the patient refuse medications that this would lead to more risk of potential self-harm than if she was on a protocol for medications against objection.  I agree with Dr. Caswell Corwin in this assessment. ? ?Kaitlyn Asa, MD, FAPA ?10/05/21  1:27 PM ? ? ?This documentation is good for (7) seven days from the date of the MD signature. New documentation must be completed every seven (7) days with detailed justification in the medical record if the patient requires continued non-emergent administration of psychotropic medications. ?

## 2021-10-05 NOTE — Progress Notes (Signed)
Adult Psychoeducational Group Note ? ?Date:  10/05/2021 ?Time:  8:31 PM ? ?Group Topic/Focus:  ?Wrap-Up Group:   The focus of this group is to help patients review their daily goal of treatment and discuss progress on daily workbooks. ? ?Participation Level:  Did Not Attend ? ?Participation Quality:   Did Not Attend ? ?Affect:   Did Not Attend ? ?Cognitive:   Did Not Attend ? ?Insight: None ? ?Engagement in Group:   Did Not Attend ? ?Modes of Intervention:   Did Not Attend ? ?Additional Comments:  Pt was encouraged to attend wrap up group but did not attend. ? ?[name] ?10/05/2021, 8:31 PM ?

## 2021-10-05 NOTE — Group Note (Signed)
LCSW Therapy Group Note ?  ?Group Date: 10/03/2021 ?Start Time: 11:30am ?End Time: 12:00pm ?  ?  ?Type of Therapy and Topic:  Group Therapy: Circle of Control ?  ?Participation Level:  Active ?  ?Description of Group:   Due to the acuity on the unit, staff recommended no close contact at this time so group was not held.  Patient was provided with written information and worksheets about circle of control.  CSW could not go over information with the patient, as she was on the phone. ?  ?  ?Jemya Depierro MSW, LCSW ?Clincal Social Worker  ?[facility]  ?

## 2021-10-05 NOTE — Group Note (Signed)
Recreation Therapy Group Note ? ? ?Group Topic:Health and Wellness  ?Group Date: 10/05/2021 ?Start Time: 1005 ?End Time: 4158 ?Facilitators: Victorino Sparrow, LRT,CTRS ?Location: [place] ? ? ?Goal Area(s) Addresses:  ?Patient will define components of whole wellness. ?Patient will verbalize benefit of whole wellness. ? ?Group Description:  Exercise/Trivia.  LRT and patients went over the importance of proper wellness.  LRT and patients focused on two of the elements that make up wellness, physical and mental.  Patients took turns leading group is stretches and exercises of their choosing.  The group then took turns asking trivia questions dealing with various topics such as science, technology, movies, music and sports. ? ? ?Affect/Mood: N/A ?  ?Participation Level: Did not attend ?  ? ?Clinical Observations/Individualized Feedback:   ? ? ?Plan: Continue to engage patient in RT group sessions 2-3x/week. ? ? ?Victorino Sparrow, LRT,CTRS ?10/05/2021 1:16 PM ?

## 2021-10-06 MED ORDER — OLANZAPINE 10 MG PO TABS
10.0000 mg | ORAL_TABLET | Freq: Every day | ORAL | Status: DC
  Administered 2021-10-06 – 2021-10-07 (×2): 10 mg via ORAL
  Filled 2021-10-06 (×9): qty 1

## 2021-10-06 MED ORDER — OLANZAPINE 10 MG IM SOLR
10.0000 mg | Freq: Every day | INTRAMUSCULAR | Status: DC
  Administered 2021-10-08 – 2021-10-11 (×4): 10 mg via INTRAMUSCULAR
  Filled 2021-10-06 (×9): qty 10

## 2021-10-06 NOTE — Group Note (Signed)
Recreation Therapy Group Note ? ? ?Group Topic:Coping Skills  ?Group Date: 10/06/2021 ?Start Time: 1000 ?End Time: 2355 ?Facilitators: Victorino Sparrow, LRT,CTRS ?Location: [place] ? ? ?Goal Area(s) Addresses:  ?Patient will identify positive coping skill strategies. ?Patient will identify benefits of using coping skills post d/c. ? ?Group Description:  Mind Map.  Patient was provided a blank template of a diagram with 32 blank boxes in a tiered system, branching from the center (similar to a bubble chart). LRT directed patients to label the middle of the diagram "Coping Skills" and consider 8 different challenges in which coping skills would be needed. Patients were directed to record those 8 (anger, anxiety, depression, fear, nervousness, overwhelmed, aggravated and finances)  in the 2nd tier boxes closest to the center. Patients were to then come up with 3 coping skills for each area identified.  LRT would then write patient responses on the board so patients could fill in any blank spaces on their diagram.  ? ? ?Affect/Mood: N/A ?  ?Participation Level: Did not attend ?  ? ?Clinical Observations/Individualized Feedback:   ? ? ?Plan: Continue to engage patient in RT group sessions 2-3x/week. ? ? ?Victorino Sparrow, LRT,CTRS ?10/06/2021 11:56 AM ?

## 2021-10-06 NOTE — Progress Notes (Signed)
Patient was anxious and irritable. She isolates in her room. Pt is religiously preoccupied. She appears disheveled. Patient agreed to take meds orally this morning after speaking with the doctor. She is paranoid and has disorganized thoughts. She denies SI/HI/AVH. Patient remains safe on Q15 min checks. Pt was compliant with morning po meds and reports no side effects.    ? ? ? ? 10/06/21 1100  ?Psychosocial Assessment  ?Patient Complaints Isolation;Anxiety;Irritability;Nervousness  ?Eye Contact Avoids  ?Facial Expression Anxious  ?Affect Irritable;Preoccupied  ?Speech Aggressive;Argumentative  ?Interaction Isolative  ?Motor Activity Slow  ?Appearance/Hygiene Disheveled  ?Behavior Characteristics Anxious;Intrusive  ?Mood Anxious  ?Thought Process  ?Coherency Disorganized  ?Content Preoccupation;Religiosity  ?Delusions Religious  ?Perception Derealization  ?Hallucination None reported or observed  ?Judgment Poor  ?Confusion Moderate  ?Danger to Self  ?Current suicidal ideation? Denies  ?Danger to Others  ?Danger to Others None reported or observed  ?Danger to Others Abnormal  ?Harmful Behavior to others No threats or harm toward other people  ?Destructive Behavior No threats or harm toward property  ? ? ?

## 2021-10-06 NOTE — Progress Notes (Addendum)
[facility] MD Progress Note ? ?10/06/2021 12:48 PM ?Kaitlyn Duncan  ?MRN:  [id number] ? ?Subjective:  Kaitlyn Duncan is a 51 yr old female who presented to on 3/22 to [facility] under IVC with paranoia, hallucinations, and delusions.  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 ?  ?On my assessment today, the patient continues to have paranoid delusions.  She reports that she is to be discharged from the unit immediately and her rationale for her discharge is "I am the [place] and [place].  They are all powerful.  They are all inside of me.  I am the leader of the [employer].  I created you and everyone in this world you are going to get me out today." ?We discussed at length, taking oral psychiatric medications as opposed to getting the IM injection, and explained to the patient again that she has not forced medication order after evaluation by 2 psychiatrists.  Patient has no insight into this.  She is nevertheless encouraged to start taking oral medications, as a part of her goals for progressing towards discharge from this hospital, when she is stable. ?She otherwise denies feeling sad or does not report any anhedonia.  Reports that sleep and appetite are intact.  Denies SI.  Denies HI. ? ?Denies side effects to current psychiatric medications.  ? ?Per nursing, attempted to elope off the unit yesterday afternoon/evening. ? ? ?Principal Problem: Schizophrenia ([place]) ?Diagnosis: Principal Problem: ?  Schizophrenia ([place]) ? ?Total Time spent with patient: 15 minutes ? ?Past Psychiatric History: See H&P ? ?Past Medical History:  ?Past Medical History:  ?Diagnosis Date  ? Allergy   ? Anemia   ? Gallstones   ? GERD  (gastroesophageal reflux disease)   ? Hemorrhoids   ? melanoma with met dz dx'd 01/2018  ? brain and adrenal  ? Seasonal allergies   ?  ?Past Surgical History:  ?Procedure Laterality Date  ? BIOPSY  01/28/2018  ? Procedure: BIOPSY;  Surgeon: Laurence Spates, MD;  Location: [facility] END[facility];  Service: Endoscopy;;  ? BREAST BIOPSY Left   ? ESOPHAGOGASTRODUODENOSCOPY N/A 01/28/2018  ? Procedure: ESOPHAGOGASTRODUODENOSCOPY (EGD);  Surgeon: Laurence Spates, MD;  Location: [facility];  Service: Endoscopy;  Laterality: N/A;  ? MOUTH SURGERY    ? OVARY SURGERY    ? removed "something"  ? ?Family History:  ?Family History  ?Problem Relation Age of Onset  ? Melanoma Mother   ? Hypertension Father   ? Breast cancer Maternal Aunt   ? Breast cancer Paternal Aunt   ? Diabetes Paternal Aunt   ? Breast cancer Maternal Grandmother   ? Breast cancer Paternal Grandmother   ? Diabetes Paternal Grandmother   ? Colon cancer Neg Hx   ? Esophageal cancer Neg Hx   ? Pancreatic cancer Neg Hx   ? Stomach cancer Neg Hx   ? Liver disease Neg Hx   ? Rectal cancer Neg Hx   ? ?Family Psychiatric  History: See H&P ?Social History:  ?Social History  ? ?Substance and Sexual Activity  ?Alcohol Use No  ?   ?Social History  ? ?Substance and Sexual Activity  ?Drug Use No  ?  ?Social History  ? ?Socioeconomic History  ? Marital status:  Single  ?  Spouse name: Not on file  ? Number of children: Not on file  ? Years of education: Not on file  ? Highest education level: Not on file  ?Occupational History  ? Not on file  ?Tobacco Use  ? Smoking status: Never  ? Smokeless tobacco: Never  ?Vaping Use  ? Vaping Use: Never used  ?Substance and Sexual Activity  ? Alcohol use: No  ? Drug use: No  ? Sexual activity: Not Currently  ?Other Topics Concern  ? Not on file  ?Social History Narrative  ? Not on file  ? ?Social Determinants of Health  ? ?Financial Resource Strain: Not on file  ?Food Insecurity: Not on file  ?Transportation Needs: Not on file  ?Physical Activity:  Not on file  ?Stress: Not on file  ?Social Connections: Not on file  ? ?Additional Social History:  ?  ?  ?  ?  ?  ?  ?  ?  ?  ?  ?  ? ?Sleep: Good ? ?Appetite:  Good ? ?Current Medications: ?Current Facility-Administered Medications  ?Medication Dose Route Frequency Provider Last Rate Last Admin  ? acetaminophen (TYLENOL) tablet 650 mg  650 mg Oral Q6H PRN White, Patrice L, NP      ? alum & mag hydroxide-simeth (MAALOX/MYLANTA) 200-200-20 MG/5ML suspension 30 mL  30 mL Oral Q4H PRN White, Patrice L, NP      ? benztropine mesylate (COGENTIN) injection 2 mg  2 mg Intramuscular BID PRN Terrace Chiem, Ovid Curd, MD      ? cetaphil lotion   Topical Daily Damita Dunnings B, MD      ? fluPHENAZine (PROLIXIN) injection 2.5 mg  2.5 mg Intramuscular BID WC Damita Dunnings B, MD   2.5 mg at 10/05/21 1246  ? Or  ? fluPHENAZine (PROLIXIN) tablet 5 mg  5 mg Oral BID WC Damita Dunnings B, MD   5 mg at 10/06/21 1059  ? fluPHENAZine (PROLIXIN) tablet 10 mg  10 mg Oral QHS Damita Dunnings B, MD      ? Or  ? fluPHENAZine (PROLIXIN) injection 5 mg  5 mg Intramuscular QHS Damita Dunnings B, MD   5 mg at 10/05/21 2115  ? hydrOXYzine (ATARAX) tablet 25 mg  25 mg Oral TID PRN White, Patrice L, NP      ? loratadine (CLARITIN) tablet 10 mg  10 mg Oral Daily Damita Dunnings B, MD      ? magnesium hydroxide (MILK OF MAGNESIA) suspension 30 mL  30 mL Oral Daily PRN White, Patrice L, NP      ? OLANZapine (ZYPREXA) injection 5 mg  5 mg Intramuscular Q8H PRN Malikhi Ogan, MD      ? OLANZapine (ZYPREXA) injection 5 mg  5 mg Intramuscular Daily Damita Dunnings B, MD   5 mg at 10/05/21 0240  ? Or  ? OLANZapine (ZYPREXA) tablet 5 mg  5 mg Oral Daily Damita Dunnings B, MD   5 mg at 10/06/21 1101  ? OLANZapine (ZYPREXA) tablet 7.5 mg  7.5 mg Oral QHS Sarahbeth Cashin, MD      ? Or  ? OLANZapine (ZYPREXA) injection 7.5 mg  7.5 mg Intramuscular QHS Therman Hughlett, Ovid Curd, MD   7.5 mg at 10/05/21 2122  ? OLANZapine (ZYPREXA) tablet 5 mg  5 mg Oral Q8H PRN Lisandro Meggett,  Kiara Keep, MD      ? propranolol ER (INDERAL LA) 24 hr capsule 60 mg  60 mg Oral Daily Lavella Hammock, MD      ?  traZODone (DESYREL) tablet 50 mg  50 mg Oral QHS PRN Eveline Sauve, MD      ? ? ?Lab Results: No results found for this or any previous visit (from the past 48 hour(s)). ? ?Blood Alcohol level:  ?Lab Results  ?Component Value Date  ? ETH <10 09/23/2021  ? ETH <10 06/25/2021  ? ? ?Metabolic Disorder Labs: ?Lab Results  ?Component Value Date  ? HGBA1C 5.1 06/28/2021  ? MPG 99.67 06/28/2021  ? MPG 119.76 02/03/2018  ? ?No results found for: PROLACTIN ?Lab Results  ?Component Value Date  ? CHOL 212 (H) 06/28/2021  ? TRIG 56 06/28/2021  ? HDL 63 06/28/2021  ? CHOLHDL 3.4 06/28/2021  ? VLDL 11 06/28/2021  ? LDLCALC 138 (H) 06/28/2021  ? [place] 91 02/03/2018  ? ? ?Physical Findings: ?AIMS: Facial and Oral Movements ?Muscles of Facial Expression: None, normal ?Lips and Perioral Area: None, normal ?Jaw: None, normal ?Tongue: None, normal,Extremity Movements ?Upper (arms, wrists, hands, fingers): None, normal ?Lower (legs, knees, ankles, toes): None, normal, Trunk Movements ?Neck, shoulders, hips: None, normal, Overall Severity ?Severity of abnormal movements (highest score from questions above): None, normal ?Incapacitation due to abnormal movements: None, normal ?Patient's awareness of abnormal movements (rate only patient's report): No Awareness, Dental Status ?Current problems with teeth and/or dentures?: No ?Does patient usually wear dentures?: No  ?CIWA:    ?COWS:    ? ?Musculoskeletal: ?Strength & Muscle Tone: within normal limits ?Gait & Station: normal ?Patient leans: N/A ? ?Psychiatric Specialty Exam: ? ?Presentation  ?General Appearance: Appropriate for Environment; Casual ? ?Eye Contact:Fair ? ?Speech:Clear and Coherent ? ?Speech Volume:Normal ? ?Handedness:Right ? ? ?Mood and Affect  ?Mood: Irritable ? ?Affect: Congruent ? ? ?Thought Process  ?Thought Processes:Linear ? ?Descriptions of  Associations:Circumstantial ? ?Orientation:Full (Time, Place and Person) ? ?Thought Content:Illogical; Delusions ? ?History of Schizophrenia/Schizoaffective disorder:Yes ? ?Duration of Psychotic Symptoms:Greater than

## 2021-10-06 NOTE — Progress Notes (Signed)
Pt took oral medications this morning.  ?

## 2021-10-06 NOTE — Progress Notes (Signed)
Patient is isolative to her room compliant with PO medications. Denies SI/HI/A/VH and verbally contracted for safety.  ? ?Support and encouragement provided. No adverse drug noted. Pt remains safe.  ?

## 2021-10-06 NOTE — Progress Notes (Signed)
Patient is isolative to her room non compliant with PO medications IM medications given. Support and encouragement provided. Safety checks ongoing without self harm gestures. Patient remains safe.  ?

## 2021-10-06 NOTE — Plan of Care (Signed)
?  Problem: Education: ?Goal: Knowledge of the prescribed therapeutic regimen will improve ?Outcome: Progressing ?  ?Problem: Coping: ?Goal: Will verbalize feelings ?Outcome: Progressing ?  ?Problem: Health Behavior/Discharge Planning: ?Goal: Compliance with prescribed medication regimen will improve ?Outcome: Progressing ?  ?Problem: Safety: ?Goal: Ability to remain free from injury will improve ?Outcome: Progressing ?  ?

## 2021-10-07 ENCOUNTER — Encounter (HOSPITAL_COMMUNITY): Payer: Self-pay

## 2021-10-07 MED ORDER — FLUPHENAZINE DECANOATE 25 MG/ML IJ SOLN
25.0000 mg | INTRAMUSCULAR | Status: DC
  Administered 2021-10-08: 25 mg via INTRAMUSCULAR
  Filled 2021-10-07: qty 1

## 2021-10-07 MED ORDER — FLUPHENAZINE HCL 2.5 MG/ML IJ SOLN
2.5000 mg | Freq: Two times a day (BID) | INTRAMUSCULAR | Status: DC
  Filled 2021-10-07 (×3): qty 1

## 2021-10-07 MED ORDER — FLUPHENAZINE HCL 2.5 MG/ML IJ SOLN
5.0000 mg | Freq: Every day | INTRAMUSCULAR | Status: AC
  Filled 2021-10-07: qty 2

## 2021-10-07 MED ORDER — FLUPHENAZINE HCL 10 MG PO TABS
10.0000 mg | ORAL_TABLET | Freq: Every day | ORAL | Status: AC
  Administered 2021-10-07: 10 mg via ORAL
  Filled 2021-10-07: qty 1

## 2021-10-07 MED ORDER — FLUPHENAZINE HCL 5 MG PO TABS
7.5000 mg | ORAL_TABLET | Freq: Two times a day (BID) | ORAL | Status: DC
  Filled 2021-10-07 (×3): qty 1

## 2021-10-07 NOTE — BH IP Treatment Plan (Signed)
Interdisciplinary Treatment and Diagnostic Plan Update ? ?10/07/2021 ?Time of Session: 10:00am  ?Kaitlyn Duncan ?MRN: [id number] ? ?Principal Diagnosis: Schizophrenia ([place]) ? ?Secondary Diagnoses: Principal Problem: ?  Schizophrenia ([place]) ? ? ?Current Medications:  ?Current Facility-Administered Medications  ?Medication Dose Route Frequency Provider Last Rate Last Admin  ? acetaminophen (TYLENOL) tablet 650 mg  650 mg Oral Q6H PRN White, Patrice L, NP      ? alum & mag hydroxide-simeth (MAALOX/MYLANTA) 200-200-20 MG/5ML suspension 30 mL  30 mL Oral Q4H PRN White, Patrice L, NP      ? benztropine mesylate (COGENTIN) injection 2 mg  2 mg Intramuscular BID PRN Massengill, Ovid Curd, MD      ? cetaphil lotion   Topical Daily Damita Dunnings B, MD      ? fluPHENAZine (PROLIXIN) injection 2.5 mg  2.5 mg Intramuscular BID WC Damita Dunnings B, MD   2.5 mg at 10/05/21 1246  ? Or  ? fluPHENAZine (PROLIXIN) tablet 5 mg  5 mg Oral BID WC Damita Dunnings B, MD   5 mg at 10/07/21 7035  ? fluPHENAZine (PROLIXIN) tablet 10 mg  10 mg Oral QHS Damita Dunnings B, MD   10 mg at 10/06/21 2053  ? Or  ? fluPHENAZine (PROLIXIN) injection 5 mg  5 mg Intramuscular QHS Damita Dunnings B, MD   5 mg at 10/05/21 2115  ? hydrOXYzine (ATARAX) tablet 25 mg  25 mg Oral TID PRN White, Patrice L, NP      ? loratadine (CLARITIN) tablet 10 mg  10 mg Oral Daily Damita Dunnings B, MD   10 mg at 10/07/21 0093  ? magnesium hydroxide (MILK OF MAGNESIA) suspension 30 mL  30 mL Oral Daily PRN White, Patrice L, NP      ? OLANZapine (ZYPREXA) tablet 10 mg  10 mg Oral QHS Massengill, Ovid Curd, MD   10 mg at 10/06/21 2053  ? Or  ? OLANZapine (ZYPREXA) injection 10 mg  10 mg Intramuscular QHS Massengill, Nathan, MD      ? OLANZapine (ZYPREXA) injection 5 mg  5 mg Intramuscular Q8H PRN Massengill, Nathan, MD      ? OLANZapine (ZYPREXA) injection 5 mg  5 mg Intramuscular Daily Damita Dunnings B, MD   5 mg at 10/07/21 0953  ? Or  ? OLANZapine (ZYPREXA) tablet 5 mg  5 mg Oral Daily  Damita Dunnings B, MD   5 mg at 10/06/21 1101  ? OLANZapine (ZYPREXA) tablet 5 mg  5 mg Oral Q8H PRN Massengill, Nathan, MD      ? propranolol ER (INDERAL LA) 24 hr capsule 60 mg  60 mg Oral Daily Lavella Hammock, MD      ? traZODone (DESYREL) tablet 50 mg  50 mg Oral QHS PRN Janine Limbo, MD      ? ?PTA Medications: ?No medications prior to admission.  ? ? ?Patient Stressors:   ? ?Patient Strengths:   ? ?Treatment Modalities: Medication Management, Group therapy, Case management,  ?1 to 1 session with clinician, Psychoeducation, Recreational therapy. ? ? ?Physician Treatment Plan for Primary Diagnosis: Schizophrenia ([place]) ?Long Term Goal(s): Improvement in symptoms so as ready for discharge  ? ?Short Term Goals: Ability to identify changes in lifestyle to reduce recurrence of condition will improve ?Ability to verbalize feelings will improve ?Ability to demonstrate self-control will improve ?Ability to identify and develop effective coping behaviors will improve ?Ability to maintain clinical measurements within normal limits will improve ?Compliance with prescribed medications will improve ?Ability to identify  triggers associated with substance abuse/mental health issues will improve ? ?Medication Management: Evaluate patient's response, side effects, and tolerance of medication regimen. ? ?Therapeutic Interventions: 1 to 1 sessions, Unit Group sessions and Medication administration. ? ?Evaluation of Outcomes: Progressing ? ?Physician Treatment Plan for Secondary Diagnosis: Principal Problem: ?  Schizophrenia ([place]) ? ?Long Term Goal(s): Improvement in symptoms so as ready for discharge  ? ?Short Term Goals: Ability to identify changes in lifestyle to reduce recurrence of condition will improve ?Ability to verbalize feelings will improve ?Ability to demonstrate self-control will improve ?Ability to identify and develop effective coping behaviors will improve ?Ability to maintain clinical measurements within  normal limits will improve ?Compliance with prescribed medications will improve ?Ability to identify triggers associated with substance abuse/mental health issues will improve    ? ?Medication Management: Evaluate patient's response, side effects, and tolerance of medication regimen. ? ?Therapeutic Interventions: 1 to 1 sessions, Unit Group sessions and Medication administration. ? ?Evaluation of Outcomes: Progressing ? ? ?RN Treatment Plan for Primary Diagnosis: Schizophrenia ([place]) ?Long Term Goal(s): Knowledge of disease and therapeutic regimen to maintain health will improve ? ?Short Term Goals: Ability to remain free from injury will improve, Ability to participate in decision making will improve, Ability to verbalize feelings will improve, Ability to disclose and discuss suicidal ideas, and Ability to identify and develop effective coping behaviors will improve ? ?Medication Management: RN will administer medications as ordered by provider, will assess and evaluate patient's response and provide education to patient for prescribed medication. RN will report any adverse and/or side effects to prescribing provider. ? ?Therapeutic Interventions: 1 on 1 counseling sessions, Psychoeducation, Medication administration, Evaluate responses to treatment, Monitor vital signs and CBGs as ordered, Perform/monitor CIWA, COWS, AIMS and Fall Risk screenings as ordered, Perform wound care treatments as ordered. ? ?Evaluation of Outcomes: Progressing ? ? ?LCSW Treatment Plan for Primary Diagnosis: Schizophrenia ([place]) ?Long Term Goal(s): Safe transition to appropriate next level of care at discharge, Engage patient in therapeutic group addressing interpersonal concerns. ? ?Short Term Goals: Engage patient in aftercare planning with referrals and resources, Increase social support, Increase emotional regulation, Facilitate acceptance of mental health diagnosis and concerns, Identify triggers associated with mental  health/substance abuse issues, and Increase skills for wellness and recovery ? ?Therapeutic Interventions: Assess for all discharge needs, 1 to 1 time with Education officer, museum, Explore available resources and support systems, Assess for adequacy in community support network, Educate family and significant other(s) on suicide prevention, Complete Psychosocial Assessment, Interpersonal group therapy. ? ?Evaluation of Outcomes: Progressing ? ? ?Progress in Treatment: ?Attending groups: No. ?Participating in groups: No. No groups being held on the unit ?Taking medication as prescribed: No. ?Toleration medication: Yes. ?Family/Significant other contact made: Yes, havel contacted:  legal guardian ?Patient understands diagnosis: No. ?Discussing patient identified problems/goals with staff: Yes. ?Medical problems stabilized or resolved: Yes. ?Denies suicidal/homicidal ideation: Yes. ?Issues/concerns per patient self-inventory: No. ?  ?  ?New problem(s) identified: No, Describe:  none ?  ?New Short Term/Long Term Goal(s): medication stabilization, elimination of SI thoughts, development of comprehensive mental wellness plan.  ?  ?  ?Patient Goals:  "To get out" ?  ?Discharge Plan or Barriers: Patient is currently receiving ACTT services through [facility].  ?  ?Reason for Continuation of Hospitalization: Aggression ?Delusions  ?Hallucinations ?Medication stabilization ?  ?Estimated Length of Stay: 3-5 days ? ? ?Scribe for Treatment Team: ?Darleen Crocker, Latanya Presser ?10/07/2021 ?11:09 AM ?

## 2021-10-07 NOTE — BHH Counselor (Signed)
CSW called pt's legal guardian Kaitlyn Duncan [phone] to inform her of patients expected discharge of Friday 10/09/2021. She states that she will be off tomorrow and Friday and ACTT will need to be the ones to pick this patient up. She states she will work on finding a hotel for her to stay in at discharge however, reports she is leaving work early today and will not have as much time to do this due to leaving work and being off the rest of the week. CSW requested a call with hotel information once it has been found/secured. ? ?Pt's guardian also asked what recommendations were for group home/ALF placement. CSW informed Ms. Kaitlyn Duncan that there were no recommendations for this however if she wanted to seek placement for this individual she could. CSW offered to assist with completing anf FL-2 for this patient. Pt's guardian was upset that [facility] was not making this recommendation. She did not seem to understand that she can make her own recommendation and decisions on what she sees fit for this patient as she is her legal guardian. ? ?CSW informed ACT Team who shared they would be able to pick this patient up however, states that their office is closed and it would be ideal to wait until Monday for this patient to discharge. MD notified.  ? ? ? ?Darletta Moll MSW, LCSW ?Clincal Social Worker  ?[facility]  ?

## 2021-10-07 NOTE — Progress Notes (Signed)
D:  Kaitlyn Duncan continues to isolate to her room.  She was argumentative, paranoid and religiously preoccupied.  She did take her PO medications but she argued and was verbally aggressive to staff.  She attempted to cheek the medications and it was a 10 minute process of getting her to swallow the pills.  She denied SI/HI or AVH.   ?A:  1:1 with RN for support and encouragement.  Medications given as ordered.  Q 15 minute checks maintained for safety.  Encouraged participation in group and unit activities.   ?R:  She is currently resting with her eyes closed and appears to be asleep.   ?she remains safe on the unit.   ? ? 10/07/21 2121  ?Psych Admission Type (Psych Patients Only)  ?Admission Status Involuntary  ?Psychosocial Assessment  ?Patient Complaints Anger;Irritability  ?Eye Contact Brief  ?Facial Expression Angry;Animated  ?Affect Angry;Irritable  ?Speech Argumentative;Loud;Abusive  ?Interaction Avoidant;Hostile;Isolative;Sarcastic  ?Motor Activity Slow  ?Appearance/Hygiene Disheveled  ?Behavior Characteristics Unwilling to participate;Agressive verbally;Agitated;Irritable;Resistant to care  ?Mood Suspicious;Irritable  ?Thought Process  ?Coherency WDL  ?Content Blaming others;Delusions;Religiosity  ?Delusions Religious  ?Perception Derealization  ?Hallucination None reported or observed  ?Judgment Poor  ?Confusion Moderate  ?Danger to Self  ?Current suicidal ideation? Denies  ?Danger to Others  ?Danger to Others Reported or observed  ?Danger to Others Abnormal  ?Harmful Behavior to others Threats of violence towards other people observed or expressed   ?Destructive Behavior No threats or harm toward property  ?Description of Harmful Behavior Smacking staff's arms  ? ? ?

## 2021-10-07 NOTE — Group Note (Signed)
Recreation Therapy Group Note ? ? ?Group Topic:Leisure Education  ?Group Date: 10/07/2021 ?Start Time: 1005 ?End Time: 6948 ?Facilitators: Victorino Sparrow, LRT,CTRS ?Location: [place] ? ? ?Goal Area(s) Addresses:  ?Patient will successfully identify positive leisure and recreation activities.  ?Patient will acknowledge benefits of participation in healthy leisure activities post discharge.  ?Patient will actively work with peers toward a shared goal. ?  ?Group Description: Pictionary. In groups, patients took turns trying to guess the picture being drawn on the board by their peers.  If someone guessed the correct answer, they got the next turn.  After several rounds of game play, the group would discuss the importance of leisure in everyday life. Post-activity discussion reviewed benefits of positive recreation outlets: reducing stress, improving coping mechanisms, increasing self-esteem, and building larger support systems. ? ? ?Affect/Mood: N/A ?  ?Participation Level: Did not attend ?  ? ?Clinical Observations/Individualized Feedback:   ? ? ?Plan: Continue to engage patient in RT group sessions 2-3x/week. ? ? ?Victorino Sparrow, LRT,CTRS ?10/07/2021 12:57 PM ?

## 2021-10-07 NOTE — Progress Notes (Addendum)
[facility] MD Progress Note ? ?10/07/2021 6:05 PM ?Kaitlyn Duncan  ?MRN:  [id number] ?Subjective:  ?Subjective:  Kaitlyn Duncan is a 51 yr old female who presented to on 3/22 to [facility] under IVC with paranoia, hallucinations, and delusions.  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 ?  ? ?Patient has received some of her medication in the last 24h PO but this AM required them Im under forced medications. Patient continues to be hypereligious with staff.  ? ?On assessment today patient reports that she is eating and sleeping well. Patient reports that she is a "spiritual being in the tope realm" and therefore she does not need to take medications. Patient reports that [name] has been actively talking to her in th e"lower realm" and that she is seeing thinks that other people do not have the ability to see. Patient denies SI and HI. Patient comments on liking the medical students pastel pink sweater. Patient remains adamant that she does not need medications. Patient also remains adamant that she does not have a legal guardian and believes that the physicians have gone to "arbitration" for her and continues to insist that she is leaving everyday.  ? ? ?Principal Problem: Schizophrenia ([place]) ?Diagnosis: Principal Problem: ?  Schizophrenia ([place]) ? ?Total Time spent with patient: 15 minutes ? ?Past Psychiatric History:  See H&P ? ?Past Medical History:  ?Past Medical History:  ?Diagnosis Date  ? Allergy   ? Anemia   ? Gallstones   ? GERD (gastroesophageal reflux disease)   ? Hemorrhoids   ? melanoma with met dz dx'd 01/2018  ? brain and adrenal  ? Seasonal allergies   ?  ?Past Surgical History:  ?Procedure Laterality Date  ? BIOPSY   01/28/2018  ? Procedure: BIOPSY;  Surgeon: Laurence Spates, MD;  Location: [facility];  Service: Endoscopy;;  ? BREAST BIOPSY Left   ? ESOPHAGOGASTRODUODENOSCOPY N/A 01/28/2018  ? Procedure: ESOPHAGOGASTRODUODENOSCOPY (EGD);  Surgeon: Laurence Spates, MD;  Location: [facility];  Service: Endoscopy;  Laterality: N/A;  ? MOUTH SURGERY    ? OVARY SURGERY    ? removed "something"  ? ?Family History:  ?Family History  ?Problem Relation Age of Onset  ? Melanoma Mother   ? Hypertension Father   ? Breast cancer Maternal Aunt   ? Breast cancer Paternal Aunt   ? Diabetes Paternal Aunt   ? Breast cancer Maternal Grandmother   ? Breast cancer Paternal Grandmother   ? Diabetes Paternal Grandmother   ? Colon cancer Neg Hx   ? Esophageal cancer Neg Hx   ? Pancreatic cancer Neg Hx   ? Stomach cancer Neg Hx   ? Liver disease Neg Hx   ? Rectal cancer Neg Hx   ? ?Family Psychiatric  History:  See H&P ?Social History:  ?Social History  ? ?Substance and Sexual Activity  ?Alcohol Use No  ?   ?Social History  ? ?Substance and Sexual Activity  ?Drug Use No  ?  ?Social History  ? ?Socioeconomic History  ? Marital status: Single  ?  Spouse name: Not on file  ? Number of children: Not on file  ? Years of education: Not on file  ? Highest education level: Not on file  ?Occupational History  ?  Not on file  ?Tobacco Use  ? Smoking status: Never  ? Smokeless tobacco: Never  ?Vaping Use  ? Vaping Use: Never used  ?Substance and Sexual Activity  ? Alcohol use: No  ? Drug use: No  ? Sexual activity: Not Currently  ?Other Topics Concern  ? Not on file  ?Social History Narrative  ? Not on file  ? ?Social Determinants of Health  ? ?Financial Resource Strain: Not on file  ?Food Insecurity: Not on file  ?Transportation Needs: Not on file  ?Physical Activity: Not on file  ?Stress: Not on file  ?Social Connections: Not on file  ? ?Additional Social History:  ?  ?  ?  ?  ?  ?  ?  ?  ?  ?  ?  ? ?Sleep: Good ? ?Appetite:  Good ? ?Current Medications: ?Current  Facility-Administered Medications  ?Medication Dose Route Frequency Provider Last Rate Last Admin  ? acetaminophen (TYLENOL) tablet 650 mg  650 mg Oral Q6H PRN White, Patrice L, NP      ? alum & mag hydroxide-simeth (MAALOX/MYLANTA) 200-200-20 MG/5ML suspension 30 mL  30 mL Oral Q4H PRN White, Patrice L, NP      ? benztropine mesylate (COGENTIN) injection 2 mg  2 mg Intramuscular BID PRN Massengill, Ovid Curd, MD      ? cetaphil lotion   Topical Daily Damita Dunnings B, MD      ? fluPHENAZine (PROLIXIN) injection 2.5 mg  2.5 mg Intramuscular BID WC Damita Dunnings B, MD   2.5 mg at 10/05/21 1246  ? Or  ? fluPHENAZine (PROLIXIN) tablet 5 mg  5 mg Oral BID WC Damita Dunnings B, MD   5 mg at 10/07/21 1314  ? fluPHENAZine (PROLIXIN) tablet 10 mg  10 mg Oral QHS Damita Dunnings B, MD   10 mg at 10/06/21 2053  ? Or  ? fluPHENAZine (PROLIXIN) injection 5 mg  5 mg Intramuscular QHS Damita Dunnings B, MD   5 mg at 10/05/21 2115  ? hydrOXYzine (ATARAX) tablet 25 mg  25 mg Oral TID PRN White, Patrice L, NP      ? loratadine (CLARITIN) tablet 10 mg  10 mg Oral Daily Damita Dunnings B, MD   10 mg at 10/07/21 4742  ? magnesium hydroxide (MILK OF MAGNESIA) suspension 30 mL  30 mL Oral Daily PRN White, Patrice L, NP      ? OLANZapine (ZYPREXA) tablet 10 mg  10 mg Oral QHS Massengill, Ovid Curd, MD   10 mg at 10/06/21 2053  ? Or  ? OLANZapine (ZYPREXA) injection 10 mg  10 mg Intramuscular QHS Massengill, Nathan, MD      ? OLANZapine (ZYPREXA) injection 5 mg  5 mg Intramuscular Q8H PRN Massengill, Nathan, MD      ? OLANZapine (ZYPREXA) injection 5 mg  5 mg Intramuscular Daily Damita Dunnings B, MD   5 mg at 10/07/21 0953  ? Or  ? OLANZapine (ZYPREXA) tablet 5 mg  5 mg Oral Daily Damita Dunnings B, MD   5 mg at 10/06/21 1101  ? OLANZapine (ZYPREXA) tablet 5 mg  5 mg Oral Q8H PRN Massengill, Nathan, MD      ? propranolol ER (INDERAL LA) 24 hr capsule 60 mg  60 mg Oral Daily Lavella Hammock, MD      ? traZODone (DESYREL) tablet 50 mg  50 mg Oral QHS PRN  Janine Limbo, MD      ? ? ?Lab Results: No results found for this or  any previous visit (from the past 48 hour(s)). ? ?Blood Alcohol level:  ?Lab Results  ?Component Value Date  ? ETH <10 09/23/2021  ? ETH <10 06/25/2021  ? ? ?Metabolic Disorder Labs: ?Lab Results  ?Component Value Date  ? HGBA1C 5.1 06/28/2021  ? MPG 99.67 06/28/2021  ? MPG 119.76 02/03/2018  ? ?No results found for: PROLACTIN ?Lab Results  ?Component Value Date  ? CHOL 212 (H) 06/28/2021  ? TRIG 56 06/28/2021  ? HDL 63 06/28/2021  ? CHOLHDL 3.4 06/28/2021  ? VLDL 11 06/28/2021  ? LDLCALC 138 (H) 06/28/2021  ? [place] 91 02/03/2018  ? ? ?Physical Findings: ?AIMS: Facial and Oral Movements ?Muscles of Facial Expression: None, normal ?Lips and Perioral Area: None, normal ?Jaw: None, normal ?Tongue: None, normal,Extremity Movements ?Upper (arms, wrists, hands, fingers): None, normal ?Lower (legs, knees, ankles, toes): None, normal, Trunk Movements ?Neck, shoulders, hips: None, normal, Overall Severity ?Severity of abnormal movements (highest score from questions above): None, normal ?Incapacitation due to abnormal movements: None, normal ?Patient's awareness of abnormal movements (rate only patient's report): No Awareness, Dental Status ?Current problems with teeth and/or dentures?: No ?Does patient usually wear dentures?: No  ?CIWA:    ?COWS:    ? ?Musculoskeletal: ?Strength & Muscle Tone: within normal limits ?Gait & Station: normal ?Patient leans: N/A ? ?Psychiatric Specialty Exam: ? ?Presentation  ?General Appearance: Appropriate for Environment; Casual ? ?Eye Contact:Good ? ?Speech:Clear and Coherent ? ?Speech Volume:Normal ? ?Handedness:Right ? ? ?Mood and Affect  ?Mood:Euthymic (changes to irritable when talking about medication) ? ?Affect:Congruent ? ? ?Thought Process  ?Thought Processes:Linear ? ?Descriptions of Associations:Circumstantial ? ?Orientation:Full (Time, Place and Person) ? ?Thought Content:Illogical; Delusions ? ?History  of Schizophrenia/Schizoaffective disorder:Yes ? ?Duration of Psychotic Symptoms:Greater than six months ? ?Hallucinations:Hallucinations: Auditory; Visual ? ?Ideas of Reference:Delusions ? ?Suicidal Thoughts

## 2021-10-07 NOTE — Group Note (Signed)
LCSW Group Therapy Note ?  ?  ?Group Date: 10/07/2021 ?Start Time: 1300 ?End Time: 1400 ?  ?Type of Therapy and Topic:  Group Therapy: Self-Esteem  ?  ?Participation Level:  Did not attend ?  ?Description of Group:   Due to infectious disease and illness on the unit, staff recommended no close contact at this time so group was not held.  Patient was provided with written information and worksheets about Self-Esteem.  Pt was sleeping and would not wake to discuss packet. ? ? ? ?Darletta Moll MSW, LCSW ?Clincal Social Worker  ?[facility]  ?

## 2021-10-07 NOTE — Hospital Course (Addendum)
Patient requires her medications by force this AM. She had difficulty accepting her LAI this AM as it was hard for patient to comprehend that this medication was not available PO. Patient became hyper religious when it was made clear that she will have to take her medications. Patient started chanting, " no meds! no shots! "Patient responded to receiving the meds by cursing everyone in her view "to the [place] of hell." Once patient received her meds she proceeded to get up and stand in her door way to shout insults (not obscenities) at attending provider. This writer attempted to check on patient later in the day, but she was resting and kept the thin blanket over her head. Patient did wake up, but began speaking in third person about how she did not want to talk.  ? ?Patient was not noticed to respond to internal or external stimuli and did not endorse SI, HI or AVH today. ?

## 2021-10-07 NOTE — Progress Notes (Signed)
?   10/07/21 1500  ?Psych Admission Type (Psych Patients Only)  ?Admission Status Involuntary  ?Psychosocial Assessment  ?Patient Complaints Anger;Anxiety  ?Eye Contact Brief  ?Facial Expression Angry;Animated  ?Affect Angry  ?Speech Argumentative  ?Interaction Assertive;Demanding;Intrusive;Sarcastic  ?Motor Activity Slow  ?Appearance/Hygiene Disheveled  ?Behavior Characteristics Aggressive physically;Agressive verbally;Agitated;Anxious;Guarded  ?Mood Anxious;Labile;Angry  ?Thought Process  ?Coherency Circumstantial  ?Content Blaming others;Delusions;Preoccupation;Obsessions  ?Delusions Religious  ?Perception Derealization  ?Hallucination None reported or observed  ?Judgment Poor  ?Confusion Moderate  ?Danger to Self  ?Current suicidal ideation? Denies  ?Danger to Others  ?Danger to Others Reported or observed  ?Danger to Others Abnormal  ?Harmful Behavior to others Threats of violence towards other people observed or expressed   ?Destructive Behavior No threats or harm toward property  ?Description of Harmful Behavior striking staff. ?(Pt. hitting staff on the arms)  ? ?D: Patient denies SI/HI. Pt was labile calling staff a "slut" and a  "whore" Pt. Was lying in her bed for most of the day.  ?A:  Pt. Took Claritin po. But refused Pt had medicine IM. ?R: Safety maintained ?

## 2021-10-07 NOTE — BHH Counselor (Signed)
CSW faxed paperwork to the [facility] office for an outpatient commitment for this patient.  ? ? ? ?Darletta Moll MSW, LCSW ?Clincal Social Worker  ?[facility]  ?

## 2021-10-08 MED ORDER — FLUPHENAZINE HCL 2.5 MG PO TABS
7.5000 mg | ORAL_TABLET | Freq: Two times a day (BID) | ORAL | Status: DC
Start: 2021-10-08 — End: 2021-10-08
  Filled 2021-10-08 (×2): qty 3

## 2021-10-08 MED ORDER — FLUPHENAZINE HCL 2.5 MG/ML IJ SOLN
2.5000 mg | Freq: Two times a day (BID) | INTRAMUSCULAR | Status: DC
  Administered 2021-10-09: 2.5 mg via INTRAMUSCULAR
  Filled 2021-10-08 (×3): qty 1

## 2021-10-08 MED ORDER — OLANZAPINE 10 MG IM SOLR
10.0000 mg | Freq: Every day | INTRAMUSCULAR | Status: DC
Start: 2021-10-08 — End: 2021-10-12
  Administered 2021-10-08 – 2021-10-12 (×5): 10 mg via INTRAMUSCULAR
  Filled 2021-10-08 (×8): qty 10

## 2021-10-08 MED ORDER — FLUPHENAZINE HCL 2.5 MG/ML IJ SOLN
2.5000 mg | Freq: Two times a day (BID) | INTRAMUSCULAR | Status: DC
  Filled 2021-10-08 (×2): qty 1

## 2021-10-08 MED ORDER — FLUPHENAZINE HCL 2.5 MG/ML IJ SOLN
5.0000 mg | Freq: Every day | INTRAMUSCULAR | Status: AC
  Administered 2021-10-08: 5 mg via INTRAMUSCULAR
  Filled 2021-10-08: qty 2

## 2021-10-08 MED ORDER — FLUPHENAZINE HCL 2.5 MG PO TABS
7.5000 mg | ORAL_TABLET | Freq: Two times a day (BID) | ORAL | Status: DC
  Filled 2021-10-08 (×3): qty 3

## 2021-10-08 MED ORDER — OLANZAPINE 10 MG IM SOLR
10.0000 mg | Freq: Every day | INTRAMUSCULAR | Status: DC
Start: 2021-10-09 — End: 2021-10-08

## 2021-10-08 MED ORDER — OLANZAPINE 10 MG PO TABS
10.0000 mg | ORAL_TABLET | Freq: Every day | ORAL | Status: DC
  Filled 2021-10-08 (×7): qty 1

## 2021-10-08 MED ORDER — FLUPHENAZINE HCL 10 MG PO TABS
10.0000 mg | ORAL_TABLET | Freq: Every day | ORAL | Status: AC
  Filled 2021-10-08: qty 1

## 2021-10-08 MED ORDER — OLANZAPINE 10 MG PO TABS
10.0000 mg | ORAL_TABLET | Freq: Every day | ORAL | Status: DC
Start: 2021-10-09 — End: 2021-10-08

## 2021-10-08 NOTE — Progress Notes (Signed)
[name] remains isolative, argumentative, verbally aggressive and hyper religious. She would not acknowledge staff when asked is she would take her pills PO.  IM medications required.  She tolerated well.  Q 15 minute checks maintained for safety.   ? ? 10/08/21 2032  ?Psych Admission Type (Psych Patients Only)  ?Admission Status Involuntary  ?Psychosocial Assessment  ?Patient Complaints Irritability;Isolation  ?Eye Contact Avoids  ?Facial Expression Angry  ?Affect Angry  ?Speech Argumentative;Loud;Abusive  ?Interaction Guarded;Minimal;Sarcastic;Defensive  ?Motor Activity Slow  ?Appearance/Hygiene Disheveled  ?Behavior Characteristics Unwilling to participate;Agressive verbally;Agitated;Resistant to care  ?Mood Suspicious;Irritable  ?Thought Process  ?Coherency Disorganized  ?Content Blaming others;Religiosity  ?Delusions Religious  ?Perception Derealization  ?Hallucination None reported or observed  ?Judgment Poor  ?Confusion Moderate  ?Danger to Self  ?Current suicidal ideation? Denies  ?Danger to Others  ?Danger to Others None reported or observed  ? ? ?

## 2021-10-08 NOTE — BHH Group Notes (Signed)
Pt did not attend 1400 group. ?

## 2021-10-08 NOTE — Group Note (Signed)
Recreation Therapy Group Note ? ? ?Group Topic:Team Building  ?Group Date: 10/08/2021 ?Start Time: 95 ?End Time: 1050 ?Facilitators: Victorino Sparrow, LRT,CTRS ?Location: [place] ? ? ?Goal Area(s) Addresses:  ?Patient will effectively work with peer towards shared goal.  ?Patient will identify skills used to make activity successful.  ?Patient will identify how skills used during activity can be applied to reach post d/c goals.  ? ?Group Description:Tallest [employer]. In teams of 5-6, patients were given 11 craft pipe cleaners. Using the materials provided, patients were instructed to compete again the opposing team(s) to build the tallest free-standing structure from floor level. The activity was timed; difficulty increased by Probation officer as Pharmacist, hospital continued.  Systematically resources were removed with additional directions for example, placing one arm behind their back, working in silence, and shape stipulations. LRT facilitated post-activity discussion reviewing team processes and necessary communication skills involved in completion. Patients were encouraged to reflect how the skills utilized, or not utilized, in this activity can be incorporated to positively impact support systems post discharge. ? ? ?Affect/Mood: N/A ?  ?Participation Level: Did not attend ?  ? ?Clinical Observations/Individualized Feedback:   ?  ? ?Plan: Continue to engage patient in RT group sessions 2-3x/week. ? ? ?Victorino Sparrow, LRT,CTRS ?10/08/2021 11:16 AM ?

## 2021-10-08 NOTE — Group Note (Signed)
Date:  10/08/2021 ?Time:  9:25 AM ? ?Group Topic/Focus:  ?Goals Group:   The focus of this group is to help patients establish daily goals to achieve during treatment and discuss how the patient can incorporate goal setting into their daily lives to aide in recovery. ?Orientation:   The focus of this group is to educate the patient on the purpose and policies of crisis stabilization and provide a format to answer questions about their admission.  The group details unit policies and expectations of patients while admitted. ? ? ? ?Participation Level:  Did Not Attend ? ?Participation Quality:   ? ?Affect:   ? ?Cognitive:   ? ?Insight:  ? ?Engagement in Group:   ? ?Modes of Intervention:   ? ?Additional Comments:   ? ?Garvin Fila ?10/08/2021, 9:25 AM ? ?

## 2021-10-08 NOTE — Progress Notes (Signed)
[facility] MD Progress Note ? ?10/08/2021 4:14 PM ?Kaitlyn Duncan  ?MRN:  [id number] ?Subjective:  Kaitlyn Duncan is a 51 yr old female who presented to on 3/22 to [facility] under IVC with paranoia, hallucinations, and delusions.  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 ? ?Patient requires her medications by force this AM. She had difficulty accepting her LAI this AM as it was hard for patient to comprehend that this medication was not available PO. Patient became hyper religious when it was made clear that she will have to take her medications. Patient started chanting, " no meds! no shots! "Patient responded to receiving the meds by cursing everyone in her view "to the [place] of hell." Once patient received her meds she proceeded to get up and stand in her door way to shout insults (not obscenities) at attending provider. This writer attempted to check on patient later in the day, but she was resting and kept the thin blanket over her head. Patient did wake up, but began speaking in third person about how she did not want to talk.  ? ?Patient was not noticed to respond to internal or external stimuli and did not endorse SI, HI or AVH today. ? ?Principal Problem: Schizophrenia ([place]) ?Diagnosis: Principal Problem: ?  Schizophrenia ([place]) ? ?Total Time spent with patient: 15 minutes ? ?Past Psychiatric History: See H&P  ? ?Past Medical History:  ?Past Medical History:  ?Diagnosis Date  ? Allergy   ? Anemia   ? Gallstones   ? GERD (gastroesophageal reflux disease)   ? Hemorrhoids   ? melanoma with met dz dx'd 01/2018  ? brain and adrenal  ? Seasonal allergies   ?  ?Past Surgical History:  ?Procedure Laterality Date  ? BIOPSY   01/28/2018  ? Procedure: BIOPSY;  Surgeon: Laurence Spates, MD;  Location: [facility];  Service: Endoscopy;;  ? BREAST BIOPSY Left   ? ESOPHAGOGASTRODUODENOSCOPY N/A 01/28/2018  ? Procedure: ESOPHAGOGASTRODUODENOSCOPY (EGD);  Surgeon: Laurence Spates, MD;  Location: [facility];  Service: Endoscopy;  Laterality: N/A;  ? MOUTH SURGERY    ? OVARY SURGERY    ? removed "something"  ? ?Family History:  ?Family History  ?Problem Relation Age of Onset  ? Melanoma Mother   ? Hypertension Father   ? Breast cancer Maternal Aunt   ? Breast cancer Paternal Aunt   ? Diabetes Paternal Aunt   ? Breast cancer Maternal Grandmother   ? Breast cancer Paternal Grandmother   ? Diabetes Paternal Grandmother   ? Colon cancer Neg Hx   ? Esophageal cancer Neg Hx   ? Pancreatic cancer Neg Hx   ? Stomach cancer Neg Hx   ? Liver disease Neg Hx   ? Rectal cancer Neg Hx   ? ?Family Psychiatric  History: See H&P ?Social History:  ?Social History  ? ?Substance and Sexual Activity  ?Alcohol Use No  ?   ?Social History  ? ?Substance and Sexual Activity  ?Drug Use No  ?  ?Social History  ? ?Socioeconomic History  ? Marital status: Single  ?  Spouse name: Not on file  ? Number of children: Not on file  ? Years of education: Not on file  ? Highest education level: Not  on file  ?Occupational History  ? Not on file  ?Tobacco Use  ? Smoking status: Never  ? Smokeless tobacco: Never  ?Vaping Use  ? Vaping Use: Never used  ?Substance and Sexual Activity  ? Alcohol use: No  ? Drug use: No  ? Sexual activity: Not Currently  ?Other Topics Concern  ? Not on file  ?Social History Narrative  ? Not on file  ? ?Social Determinants of Health  ? ?Financial Resource Strain: Not on file  ?Food Insecurity: Not on file  ?Transportation Needs: Not on file  ?Physical Activity: Not on file  ?Stress: Not on file  ?Social Connections: Not on file  ? ?Additional Social History:  ?  ?  ?  ?  ?  ?  ?  ?  ?  ?  ?  ? ?Sleep: Good ? ?Appetite:  Good ? ?Current Medications: ?Current  Facility-Administered Medications  ?Medication Dose Route Frequency Provider Last Rate Last Admin  ? acetaminophen (TYLENOL) tablet 650 mg  650 mg Oral Q6H PRN White, Patrice L, NP      ? alum & mag hydroxide-simeth (MAALOX/MYLANTA) 200-200-20 MG/5ML suspension 30 mL  30 mL Oral Q4H PRN White, Patrice L, NP      ? benztropine mesylate (COGENTIN) injection 2 mg  2 mg Intramuscular BID PRN Janine Limbo, MD      ? cetaphil lotion   Topical Daily Freida Busman, MD   Given at 10/08/21 [phone]  ? [START ON 10/09/2021] fluPHENAZine (PROLIXIN) tablet 7.5 mg  7.5 mg Oral BID Damita Dunnings B, MD      ? Or  ? [START ON 10/09/2021] fluPHENAZine (PROLIXIN) injection 2.5 mg  2.5 mg Intramuscular BID Damita Dunnings B, MD      ? fluPHENAZine (PROLIXIN) tablet 10 mg  10 mg Oral Q2000 Damita Dunnings B, MD      ? Or  ? fluPHENAZine (PROLIXIN) injection 5 mg  5 mg Intramuscular Q2000 Damita Dunnings B, MD      ? fluPHENAZine decanoate (PROLIXIN) injection 25 mg  25 mg Intramuscular Q14 Days Damita Dunnings B, MD   25 mg at 10/08/21 1111  ? hydrOXYzine (ATARAX) tablet 25 mg  25 mg Oral TID PRN White, Patrice L, NP      ? loratadine (CLARITIN) tablet 10 mg  10 mg Oral Daily Damita Dunnings B, MD   10 mg at 10/08/21 1002  ? magnesium hydroxide (MILK OF MAGNESIA) suspension 30 mL  30 mL Oral Daily PRN White, Patrice L, NP      ? OLANZapine (ZYPREXA) tablet 10 mg  10 mg Oral QHS Massengill, Ovid Curd, MD   10 mg at 10/07/21 2121  ? Or  ? OLANZapine (ZYPREXA) injection 10 mg  10 mg Intramuscular QHS Massengill, Nathan, MD      ? OLANZapine (ZYPREXA) injection 10 mg  10 mg Intramuscular Daily Damita Dunnings B, MD   10 mg at 10/08/21 1111  ? Or  ? OLANZapine (ZYPREXA) tablet 10 mg  10 mg Oral Daily Damita Dunnings B, MD      ? OLANZapine (ZYPREXA) injection 5 mg  5 mg Intramuscular Q8H PRN Massengill, Nathan, MD      ? OLANZapine (ZYPREXA) tablet 5 mg  5 mg Oral Q8H PRN Massengill, Nathan, MD      ? propranolol ER (INDERAL LA) 24 hr capsule 60 mg  60 mg  Oral Daily Lavella Hammock, MD      ? traZODone (DESYREL) tablet 50 mg  50 mg Oral QHS PRN Massengill, Ovid Curd, MD      ? ? ?Lab Results: No results found for this or any previous visit (from the past 48 hour(s)). ? ?Blood Alcohol level:  ?Lab Results  ?Component Value Date  ? ETH <10 09/23/2021  ? ETH <10 06/25/2021  ? ? ?Metabolic Disorder Labs: ?Lab Results  ?Component Value Date  ? HGBA1C 5.1 06/28/2021  ? MPG 99.67 06/28/2021  ? MPG 119.76 02/03/2018  ? ?No results found for: PROLACTIN ?Lab Results  ?Component Value Date  ? CHOL 212 (H) 06/28/2021  ? TRIG 56 06/28/2021  ? HDL 63 06/28/2021  ? CHOLHDL 3.4 06/28/2021  ? VLDL 11 06/28/2021  ? LDLCALC 138 (H) 06/28/2021  ? [place] 91 02/03/2018  ? ? ?Physical Findings: ?AIMS: Facial and Oral Movements ?Muscles of Facial Expression: None, normal ?Lips and Perioral Area: None, normal ?Jaw: None, normal ?Tongue: None, normal,Extremity Movements ?Upper (arms, wrists, hands, fingers): None, normal ?Lower (legs, knees, ankles, toes): None, normal, Trunk Movements ?Neck, shoulders, hips: None, normal, Overall Severity ?Severity of abnormal movements (highest score from questions above): None, normal ?Incapacitation due to abnormal movements: None, normal ?Patient's awareness of abnormal movements (rate only patient's report): No Awareness, Dental Status ?Current problems with teeth and/or dentures?: No ?Does patient usually wear dentures?: No  ?CIWA:    ?COWS:    ? ?Musculoskeletal: ?Strength & Muscle Tone: within normal limits ?Gait & Station: normal ?Patient leans: N/A ? ?Psychiatric Specialty Exam: ? ?Presentation  ?General Appearance: Appropriate for Environment (wearing matching sweat set) ? ?Eye Contact:Good ? ?Speech:Clear and Coherent ? ?Speech Volume:Normal (increased when she is upset about medications) ? ?Handedness:Right ? ? ?Mood and Affect  ?Mood:Irritable; Anxious ? ?Affect:Congruent ? ? ?Thought Process  ?Thought Processes:Linear ? ?Descriptions of  Associations:Circumstantial ? ?Orientation:-- (not formally assessed) ? ?Thought Content:Illogical; Delusions ? ?History of Schizophrenia/Schizoaffective disorder:Yes ? ?Duration of Psychotic Symptoms:Greater th

## 2021-10-08 NOTE — Progress Notes (Signed)
?   10/08/21 1500  ?Psych Admission Type (Psych Patients Only)  ?Admission Status Involuntary  ?Psychosocial Assessment  ?Patient Complaints Anger;Anxiety  ?Eye Contact Avoids  ?Facial Expression Angry;Animated  ?Affect Angry  ?Speech Argumentative  ?Interaction Hostile;Avoidant;Arrogant  ?Motor Activity Slow  ?Appearance/Hygiene Disheveled  ?Behavior Characteristics Unwilling to participate;Agressive verbally;Agitated;Anxious  ?Mood Suspicious;Irritable  ?Thought Process  ?Coherency Blocking;Disorganized  ?Content Blaming others;Magical thinking;Religiosity  ?Delusions Religious  ?Perception Derealization  ?Hallucination None reported or observed  ?Judgment Poor  ?Confusion Moderate  ?Danger to Self  ?Current suicidal ideation? Denies  ?Danger to Others  ?Danger to Others None reported or observed  ?Danger to Others Abnormal  ?Harmful Behavior to others No threats or harm toward other people  ?Destructive Behavior No threats or harm toward property  ? ?D: Pt. Refused interview and had covers covering her face gripped with her hands. Pt stated "you don't know boundaries!" Pt. Was labile and refused PO medicine.  ?A: Pt. Given IM meds ?R: safety maintained ?

## 2021-10-09 MED ORDER — FLUPHENAZINE HCL 2.5 MG PO TABS
7.5000 mg | ORAL_TABLET | Freq: Two times a day (BID) | ORAL | Status: DC
  Filled 2021-10-09 (×10): qty 3

## 2021-10-09 MED ORDER — FLUPHENAZINE HCL 5 MG/ML PO CONC
7.5000 mg | Freq: Two times a day (BID) | ORAL | Status: DC
  Filled 2021-10-09 (×10): qty 1.5

## 2021-10-09 MED ORDER — FLUPHENAZINE HCL 2.5 MG/ML IJ SOLN
2.5000 mg | Freq: Two times a day (BID) | INTRAMUSCULAR | Status: DC
  Administered 2021-10-09 – 2021-10-12 (×6): 2.5 mg via INTRAMUSCULAR
  Filled 2021-10-09 (×5): qty 1
  Filled 2021-10-09: qty 10
  Filled 2021-10-09 (×5): qty 1

## 2021-10-09 NOTE — Progress Notes (Signed)
?   10/09/21 0818  ?Psych Admission Type (Psych Patients Only)  ?Admission Status Involuntary  ?Psychosocial Assessment  ?Patient Complaints Anger  ?Eye Contact Avoids  ?Facial Expression Animated;Angry;Anxious  ?Affect Angry  ?Speech Aggressive  ?Interaction Childlike;Arrogant;Assertive  ?Motor Activity Slow  ?Appearance/Hygiene Disheveled  ?Behavior Characteristics Agitated;Anxious  ?Mood Anxious;Labile;Suspicious;Angry  ?Thought Process  ?Coherency Disorganized  ?Content Blaming others;Religiosity  ?Delusions Religious  ?Perception WDL  ?Hallucination None reported or observed  ?Judgment WDL  ?Confusion Mild  ?Danger to Self  ?Current suicidal ideation? Denies  ?Danger to Others Abnormal  ?Harmful Behavior to others No threats or harm toward other people  ? ?Pt. Refused interview with nurse. Pt. Was isolative in room. Pt. Refused PO medicine. Pt. Took IM medicine. Safety maintained. ?

## 2021-10-09 NOTE — Group Note (Signed)
LCSW Group Therapy Note ? ?Group Date: 10/09/2021 ?Start Time: 1100 ?End Time: 1200 ? ? ?Type of Therapy and Topic:  Group Therapy: Anger Cues and Responses ? ?Participation Level:  None ? ? ?Description of Group:   ?In this group, patients learned how to recognize the physical, cognitive, emotional, and behavioral responses they have to anger-provoking situations.  They identified a recent time they became angry and how they reacted.  They analyzed how their reaction was possibly beneficial and how it was possibly unhelpful.  The group discussed a variety of healthier coping skills that could help with such a situation in the future.  Focus was placed on how helpful it is to recognize the underlying emotions to our anger, because working on those can lead to a more permanent solution as well as our ability to focus on the important rather than the urgent. ? ?Therapeutic Goals: ?Patients will remember their last incident of anger and how they felt emotionally and physically, what their thoughts were at the time, and how they behaved. ?Patients will identify how their behavior at that time worked for them, as well as how it worked against them. ?Patients will explore possible new behaviors to use in future anger situations. ?Patients will learn that anger itself is normal and cannot be eliminated, and that healthier reactions can assist with resolving conflict rather than worsening situations. ? ?Summary of Patient Progress:  Due to lack of staffing patient was provided with written information and worksheets about Anger Management.  Pt declined to discuss materials given.  ? ?Therapeutic Modalities:   ?Cognitive Behavioral Therapy ? ? ? ?Vassie Moselle, LCSW ?10/09/2021  11:02 AM   ? ?

## 2021-10-09 NOTE — Progress Notes (Signed)
?   10/09/21 2335  ?Psych Admission Type (Psych Patients Only)  ?Admission Status Involuntary  ?Psychosocial Assessment  ?Patient Complaints Agitation  ?Eye Contact Avoids  ?Facial Expression Animated;Angry  ?Affect Angry  ?Speech Aggressive;Loud  ?Interaction Assertive  ?Motor Activity Slow  ?Appearance/Hygiene Disheveled  ?Behavior Characteristics Agitated  ?Mood Anxious;Labile;Angry  ?Thought Process  ?Coherency Disorganized  ?Content Blaming others;Religiosity  ?Delusions Religious;Paranoid  ?Perception WDL  ?Hallucination None reported or observed  ?Judgment Poor  ?Confusion Mild  ?Danger to Others  ?Danger to Others None reported or observed  ?Danger to Others Abnormal  ?Harmful Behavior to others No threats or harm toward other people  ?Destructive Behavior No threats or harm toward property  ? ? ?

## 2021-10-09 NOTE — [facility] Group Notes (Signed)
Spirituality group facilitated by Kathrynn Humble, [place].  ? ?Group Description: Group focused on topic of hope. Patients participated in facilitated discussion around topic, connecting with one another around experiences and definitions for hope. Group members engaged with visual explorer photos, reflecting on what hope looks like for them today. Group engaged in discussion around how their definitions of hope are present today in hospital.  ? ?Modalities: Psycho-social ed, Adlerian, Narrative, MI  ? ?Patient Progress: Patient was asleep and did not attend group.  ?

## 2021-10-09 NOTE — Progress Notes (Signed)
Adult Psychoeducational Group Note ? ?Date:  10/09/2021 ?Time:  8:15 PM ? ?Group Topic/Focus:  ?Wrap-Up Group:   The focus of this group is to help patients review their daily goal of treatment and discuss progress on daily workbooks. ? ?Participation Level:  Did not attend ? ?Participation Quality:  Did not attend ? ?Affect:  Did not attend ? ?Cognitive:  Did not attend ? ?Insight: None ? ?Engagement in Group:  Did not attend ? ?Modes of Intervention:  Did not attend ? ?Additional Comments:   ?Pt was encouraged to go to group but refused  ? ?Gerhard Perches ?10/09/2021, 8:15 PM ?

## 2021-10-09 NOTE — Group Note (Signed)
Recreation Therapy Group Note ? ? ?Group Topic:Communication  ?Group Date: 10/09/2021 ?Start Time: 5465 ?End Time: 1100 ?Facilitators: Victorino Sparrow, LRT,CTRS ?Location: [facility] ? ? ?Goal Area(s) Addresses:  ?Patient will effectively listen to complete activity.  ?Patient will identify communication skills used to make activity successful.  ?Patient will identify how skills used during activity can be used to reach post d/c goals.  ?  ?Group Description: Geometric Drawings.  Three volunteers from the peer group will be shown an abstract picture with a particular arrangement of geometrical shapes.  Each round, one 'speaker' will describe the pattern, as accurately as possible without revealing the image to the group.  The remaining group members will listen and draw the picture to reflect how it is described to them. Patients with the role of 'listener' cannot ask clarifying questions but, may request that the speaker repeat a direction. Once the drawings are complete, the presenter will show the rest of the group the picture and compare how close each person came to drawing the picture. LRT will facilitate a post-activity discussion regarding effective communication and the importance of planning, listening, and asking for clarification in daily interactions with others. ? ? ?Affect/Mood: N/A ?  ?Participation Level: Did not attend ?  ? ?Clinical Observations/Individualized Feedback:   ?  ? ?Plan: Continue to engage patient in RT group sessions 2-3x/week. ? ? ?Victorino Sparrow, LRT,CTRS ?10/09/2021 11:18 AM ?

## 2021-10-09 NOTE — Progress Notes (Signed)
[facility] MD Progress Note ? ?10/09/2021 4:50 PM ?Kaitlyn Duncan  ?MRN:  [id number] ?Subjective:  Kaitlyn Duncan is a 51 yr old female who presented to on 3/22 to [facility] under IVC with paranoia, hallucinations, and delusions.  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 ? ?Patient required medications by IM this morning. ? ?Patient reports that she is leaving today and that she was told that the physicians were going to be discharging her.  Patient is confronted that this is not the truth, patient becomes argumentative and defiant saying that she was told this begins meeting he convoluted sentence about how she heard this.  However 3 patient's sentence and story she does not ever appear to endorse auditory hallucinations instead appears to be indicating that she has fabricated the statement.  Continued to endorse that she does not need any form of medications and that she is in the "higher ground" and that she is in the " 3 and 1 tribune and verbally based officer, and that [place] and a prophet and in the top realm" continues on that everyone else around her specifically including staff are in the "lower realm."  Provider attempted to assess the patient was hurt and upset about continuing to receive medications via IM.  Despite attempting to redirect patient and remind her that she has the opportunity to take medications p.o., patient again became defined that she does not require medications and should not be receiving either form.  Patient then said that she was "feeling a little everything."  Patient then began to talk about how providers are pointed at her and calling her mentally ill began to claim that providers needed  medication because they are mentally ill and said that every time "you point 1 finger at me you are pointing one finger back at yourself."  Patient said this multiple times while pointing her finger at provider with her thumb pointed towards herself.  Patient did not endorse SI, HI or AVH on assessment today.  Patient was a little bit difficult to formally assess safety however she continued preoccupied with her religious believes and her delusions regarding her diagnosis (or lack thereof in her mind. )  Patient's assessment and did with her cursing the providers to the "fiery likes of hell."  Patient reported that everyone was going to hell for giving her medicine. ?Principal Problem: Schizophrenia ([place]) ?Diagnosis: Principal Problem: ?  Schizophrenia ([place]) ? ?Total Time spent with patient: 20 minutes ? ?Past Psychiatric History: See H&P ? ?Past Medical History:  ?Past Medical History:  ?Diagnosis Date  ? Allergy   ? Anemia   ? Gallstones   ? GERD (gastroesophageal reflux disease)   ? Hemorrhoids   ? melanoma with met dz dx'd 01/2018  ? brain and adrenal  ? Seasonal allergies   ?  ?Past Surgical History:  ?Procedure Laterality Date  ? BIOPSY  01/28/2018  ? Procedure: BIOPSY;  Surgeon: Laurence Spates, MD;  Location: [facility];  Service: Endoscopy;;  ? BREAST BIOPSY Left   ? ESOPHAGOGASTRODUODENOSCOPY N/A 01/28/2018  ? Procedure: ESOPHAGOGASTRODUODENOSCOPY (EGD);  Surgeon: Laurence Spates, MD;  Location: [facility];  Service: Endoscopy;  Laterality: N/A;  ? MOUTH SURGERY    ? OVARY SURGERY    ?  removed "something"  ? ?Family History:  ?Family History  ?Problem Relation Age of Onset  ? Melanoma Mother   ? Hypertension Father   ? Breast cancer Maternal Aunt   ? Breast cancer Paternal Aunt   ? Diabetes Paternal Aunt   ? Breast cancer Maternal Grandmother   ? Breast cancer Paternal Grandmother   ? Diabetes Paternal Grandmother   ? Colon cancer Neg Hx   ? Esophageal cancer Neg Hx   ? Pancreatic cancer Neg Hx   ? Stomach  cancer Neg Hx   ? Liver disease Neg Hx   ? Rectal cancer Neg Hx   ? ?Family Psychiatric  History: See H&P ?Social History:  ?Social History  ? ?Substance and Sexual Activity  ?Alcohol Use No  ?   ?Social History  ? ?Substance and Sexual Activity  ?Drug Use No  ?  ?Social History  ? ?Socioeconomic History  ? Marital status: Single  ?  Spouse name: Not on file  ? Number of children: Not on file  ? Years of education: Not on file  ? Highest education level: Not on file  ?Occupational History  ? Not on file  ?Tobacco Use  ? Smoking status: Never  ? Smokeless tobacco: Never  ?Vaping Use  ? Vaping Use: Never used  ?Substance and Sexual Activity  ? Alcohol use: No  ? Drug use: No  ? Sexual activity: Not Currently  ?Other Topics Concern  ? Not on file  ?Social History Narrative  ? Not on file  ? ?Social Determinants of Health  ? ?Financial Resource Strain: Not on file  ?Food Insecurity: Not on file  ?Transportation Needs: Not on file  ?Physical Activity: Not on file  ?Stress: Not on file  ?Social Connections: Not on file  ? ?Additional Social History:  ?  ?  ?  ?  ?  ?  ?  ?  ?  ?  ?  ? ?Sleep: Good ? ?Appetite:  Good ? ?Current Medications: ?Current Facility-Administered Medications  ?Medication Dose Route Frequency Provider Last Rate Last Admin  ? acetaminophen (TYLENOL) tablet 650 mg  650 mg Oral Q6H PRN White, Patrice L, NP      ? alum & mag hydroxide-simeth (MAALOX/MYLANTA) 200-200-20 MG/5ML suspension 30 mL  30 mL Oral Q4H PRN White, Patrice L, NP      ? benztropine mesylate (COGENTIN) injection 2 mg  2 mg Intramuscular BID PRN Janine Limbo, MD      ? cetaphil lotion   Topical Daily Freida Busman, MD   Given at 10/08/21 [id number]  ? fluPHENAZine (PROLIXIN) tablet 7.5 mg  7.5 mg Oral BID Massengill, Ovid Curd, MD      ? Or  ? fluPHENAZine (PROLIXIN) 5 MG/ML solution 7.5 mg  7.5 mg Oral BID Massengill, Nathan, MD      ? Or  ? fluPHENAZine (PROLIXIN) injection 2.5 mg  2.5 mg Intramuscular BID Massengill, Nathan, MD       ? fluPHENAZine decanoate (PROLIXIN) injection 25 mg  25 mg Intramuscular Q14 Days Damita Dunnings B, MD   25 mg at 10/08/21 1111  ? hydrOXYzine (ATARAX) tablet 25 mg  25 mg Oral TID PRN White, Patrice L, NP      ? loratadine (CLARITIN) tablet 10 mg  10 mg Oral Daily Damita Dunnings B, MD   10 mg at 10/08/21 1002  ? magnesium hydroxide (MILK OF MAGNESIA) suspension 30 mL  30 mL Oral Daily PRN Marissa Calamity, NP      ?  OLANZapine (ZYPREXA) tablet 10 mg  10 mg Oral QHS Massengill, Ovid Curd, MD   10 mg at 10/07/21 2121  ? Or  ? OLANZapine (ZYPREXA) injection 10 mg  10 mg Intramuscular QHS Massengill, Ovid Curd, MD   10 mg at 10/08/21 2032  ? OLANZapine (ZYPREXA) injection 10 mg  10 mg Intramuscular Daily Damita Dunnings B, MD   10 mg at 10/09/21 1004  ? Or  ? OLANZapine (ZYPREXA) tablet 10 mg  10 mg Oral Daily Damita Dunnings B, MD      ? OLANZapine (ZYPREXA) injection 5 mg  5 mg Intramuscular Q8H PRN Massengill, Ovid Curd, MD      ? OLANZapine (ZYPREXA) tablet 5 mg  5 mg Oral Q8H PRN Massengill, Nathan, MD      ? propranolol ER (INDERAL LA) 24 hr capsule 60 mg  60 mg Oral Daily Lavella Hammock, MD      ? traZODone (DESYREL) tablet 50 mg  50 mg Oral QHS PRN Massengill, Ovid Curd, MD      ? ? ?Lab Results: No results found for this or any previous visit (from the past 48 hour(s)). ? ?Blood Alcohol level:  ?Lab Results  ?Component Value Date  ? ETH <10 09/23/2021  ? ETH <10 06/25/2021  ? ? ?Metabolic Disorder Labs: ?Lab Results  ?Component Value Date  ? HGBA1C 5.1 06/28/2021  ? MPG 99.67 06/28/2021  ? MPG 119.76 02/03/2018  ? ?No results found for: PROLACTIN ?Lab Results  ?Component Value Date  ? CHOL 212 (H) 06/28/2021  ? TRIG 56 06/28/2021  ? HDL 63 06/28/2021  ? CHOLHDL 3.4 06/28/2021  ? VLDL 11 06/28/2021  ? LDLCALC 138 (H) 06/28/2021  ? [place] 91 02/03/2018  ? ? ?Physical Findings: ?AIMS: Facial and Oral Movements ?Muscles of Facial Expression: None, normal ?Lips and Perioral Area: None, normal ?Jaw: None, normal ?Tongue: None,  normal,Extremity Movements ?Upper (arms, wrists, hands, fingers): None, normal ?Lower (legs, knees, ankles, toes): None, normal, Trunk Movements ?Neck, shoulders, hips: None, normal, Overall Severity ?Sever

## 2021-10-10 NOTE — Progress Notes (Addendum)
Patient refused to take medications PO, IMs given, patient angry and argumentative. Religiously preoccupied reporting to staff she is in the upper realm. ?

## 2021-10-10 NOTE — Progress Notes (Signed)
Pt refused evening vital signs ?

## 2021-10-10 NOTE — Plan of Care (Signed)
?  Problem: Nutritional: ?Goal: Ability to achieve adequate nutritional intake will improve ?Outcome: Progressing ?  ?Problem: Safety: ?Goal: Ability to remain free from injury will improve ?Outcome: Progressing ?  ?Problem: Self-Care: ?Goal: Ability to participate in self-care as condition permits will improve ?Outcome: Progressing ?  ?

## 2021-10-10 NOTE — Progress Notes (Signed)
?   10/10/21 1141  ?Psych Admission Type (Psych Patients Only)  ?Admission Status Involuntary  ?Psychosocial Assessment  ?Patient Complaints Agitation;Irritability  ?Eye Contact Avoids  ?Facial Expression Angry  ?Affect Angry  ?Speech Loud;Aggressive  ?Interaction Minimal  ?Motor Activity Slow  ?Appearance/Hygiene Disheveled  ?Behavior Characteristics Agitated  ?Mood Labile  ?Thought Process  ?Coherency Disorganized  ?Content Blaming others;Religiosity  ?Delusions Religious;Paranoid  ?Perception WDL  ?Hallucination None reported or observed  ?Judgment Impaired  ?Confusion Mild  ?Danger to Self  ?Current suicidal ideation? Denies  ?Danger to Others  ?Danger to Others None reported or observed  ? ? ?

## 2021-10-10 NOTE — [facility] Group Notes (Signed)
.  Psychoeducational Group Note ? ? ? ?Date:  4/8//23 ?Time: 1100-1200 ? ? ? ?Purpose of Group: . The group focus' on teaching patients on how to identify their needs and their Life Skills:  A group where two lists are made. What people need and what are things that we do that are unhealthy. The lists are developed by the patients and it is explained that we often do the actions that are not healthy to get our list of needs met. ? ?Goal:: to develop the coping skills needed to get their needs met ? ?Group not done due to acuity of the unit. ? ?Dellia Donnelly A ? ?

## 2021-10-10 NOTE — Progress Notes (Signed)
[facility] MD Progress Note ? ?10/10/2021 2:51 PM ?Kaitlyn Duncan  ?MRN:  [id number] ?Subjective:   ?Kaitlyn Duncan is a 51 yr old female who presented to on 3/22 to [facility] under IVC with paranoia, hallucinations, and delusions.  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 ? ? ?Case was discussed in the multidisciplinary team. MAR was reviewed and patient was not compliant with medications requiring IM medications yesterday. ? ? ?Psychiatric Team made the following recommendations yesterday: ?-Forced Medication Over Objection Order Placed by Dr. Mamie Levers 3/26 and reapplied on 4/3 by Dr. Nelda Marseille ?-Received Prolixin Dec 25 mg on 4/6 for psychosis ?-Continue Prolixin 7.5 mg tablet or liquid PO or 2.5 mg IM BID for psychosis ?-Continue Zyprexa 10 mg PO or IM BID for psychosis ?-Continue Cogentin 2 mg IM BID PRN antipsychotic side effects ?-Continue Agitation Protocol: Zyprexa 5 mg PO or IM  ? ? ?On first attempt to interview the patient she was sitting in bed eating breakfast and asked if she could be interview later and asked politely for the door to be closed. ? ?On second attempt to interview patient she was sitting in bed with covers over her head. She claimed the interviewer was a fraud, would be in [place] sooner rather than later, was unprofessional due to having a pony tail, was a whore and a warlock.  She continued to refer to herself in the third person stating "she is the 3 in 1 triune god" and "she does not need medicine you need medicine."  She refused to answer questions.  When asked if she had an SI, HI, or AVH she did not answer.  Encouraged her to take her medications and discussed that she had liquid  Prolixin available if she would prefer that to a pill form.  She continued to not speak. ? ? ?Principal Problem: Schizophrenia ([place]) ?Diagnosis: Principal Problem: ?  Schizophrenia ([place]) ? ?Total Time spent with patient:  ?I personally spent 25 minutes on the unit in direct patient care. The direct patient care time included face-to-face time with the patient, reviewing the patient's chart, communicating with other professionals, and coordinating care. Greater than 50% of this time was spent in counseling or coordinating care with the patient regarding goals of hospitalization, psycho-education, and discharge planning needs. ? ? 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 ? ?Past Medical History:  ?Past Medical History:  ?Diagnosis Date  ? Allergy   ? Anemia   ? Gallstones   ? GERD (gastroesophageal reflux disease)   ? Hemorrhoids   ? melanoma with met dz dx'd 01/2018  ? brain and adrenal  ? Seasonal allergies   ?  ?Past Surgical History:  ?Procedure Laterality Date  ? BIOPSY  01/28/2018  ? Procedure: BIOPSY;  Surgeon: Laurence Spates, MD;  Location: [facility];  Service: Endoscopy;;  ? BREAST BIOPSY Left   ? ESOPHAGOGASTRODUODENOSCOPY N/A 01/28/2018  ? Procedure: ESOPHAGOGASTRODUODENOSCOPY (EGD);  Surgeon: Laurence Spates, MD;  Location: [facility];  Service: Endoscopy;  Laterality: N/A;  ? MOUTH SURGERY    ? OVARY SURGERY    ? removed "something"  ? ?Family History:  ?Family History  ?Problem Relation Age of Onset  ? Melanoma Mother   ? Hypertension Father   ? Breast cancer Maternal Aunt   ? Breast cancer Paternal Aunt   ? Diabetes Paternal Aunt   ? Breast cancer Maternal Grandmother   ? Breast cancer  Paternal Grandmother   ? Diabetes Paternal Grandmother   ? Colon cancer Neg Hx   ? Esophageal cancer Neg Hx   ? Pancreatic cancer Neg Hx   ? Stomach cancer Neg Hx   ? Liver disease Neg Hx   ? Rectal cancer Neg Hx   ? ?Family Psychiatric  History: Could not assess ?Social History:  ?Social History  ? ?Substance and Sexual Activity  ?Alcohol Use No  ?   ?Social History  ? ?Substance and Sexual Activity  ?Drug Use No  ?  ?Social History  ? ?Socioeconomic History  ? Marital status: Single  ?  Spouse name: Not on file  ? Number of children: Not on file  ? Years of education: Not on file  ? Highest education level: Not on file  ?Occupational History  ? Not on file  ?Tobacco Use  ? Smoking status: Never  ? Smokeless tobacco: Never  ?Vaping Use  ? Vaping Use: Never used  ?Substance and Sexual Activity  ? Alcohol use: No  ? Drug use: No  ? Sexual activity: Not Currently  ?Other Topics Concern  ? Not on file  ?Social History Narrative  ? Not on file  ? ?Social Determinants of Health  ? ?Financial Resource Strain: Not on file  ?Food Insecurity: Not on file  ?Transportation Needs: Not on file  ?Physical Activity: Not on file  ?Stress: Not on file  ?Social Connections: Not on file  ? ?Additional Social History:  ?  ?  ?  ?  ?  ?  ?  ?  ?  ?  ?  ? ?Sleep:  Could not assess ? ?Appetite:   Could not assess ? ?Current Medications: ?Current Facility-Administered Medications  ?Medication Dose Route Frequency Provider Last Rate Last Admin  ? acetaminophen (TYLENOL) tablet 650 mg  650 mg Oral Q6H PRN White, Patrice L, NP      ? alum & mag hydroxide-simeth (MAALOX/MYLANTA) 200-200-20 MG/5ML suspension 30 mL  30 mL Oral Q4H PRN White, Patrice L, NP      ? benztropine mesylate (COGENTIN) injection 2 mg  2 mg Intramuscular BID PRN Janine Limbo, MD      ? cetaphil lotion   Topical Daily Freida Busman, MD   Given at 10/08/21 [phone]  ? fluPHENAZine (PROLIXIN) tablet 7.5 mg  7.5 mg Oral BID Massengill, Ovid Curd, MD      ? Or  ? fluPHENAZine  (PROLIXIN) 5 MG/ML solution 7.5 mg  7.5 mg Oral BID Massengill, Nathan, MD      ? Or  ? fluPHENAZine (PROLIXIN) injection 2.5 mg  2.5 mg Intramuscular BID Massengill, Ovid Curd, MD   2.5 mg at 10/10/21 0957  ? fluPHENAZine decanoate (PROLIXIN) injection 25 mg  25 mg Intramuscular Q14 Days Damita Dunnings B, MD   25 mg at 10/08/21 1111  ? hydrOXYzine (ATARAX) tablet 25 mg  25 mg Oral TID PRN White, Patrice L, NP      ? loratadine (CLARITIN) tablet 10 mg  10 mg Oral Daily McQuilla,  Mila Palmer, MD   10 mg at 10/10/21 1287  ? magnesium hydroxide (MILK OF MAGNESIA) suspension 30 mL  30 mL Oral Daily PRN White, Patrice L, NP      ? OLANZapine (ZYPREXA) tablet 10 mg  10 mg Oral QHS Massengill, Ovid Curd, MD   10 mg at 10/07/21 2121  ? Or  ? OLANZapine (ZYPREXA) injection 10 mg  10 mg Intramuscular QHS Massengill, Ovid Curd, MD   10 mg at 10/09/21 1940  ? OLANZapine (ZYPREXA) injection 10 mg  10 mg Intramuscular Daily Damita Dunnings B, MD   10 mg at 10/10/21 0956  ? Or  ? OLANZapine (ZYPREXA) tablet 10 mg  10 mg Oral Daily Damita Dunnings B, MD      ? OLANZapine (ZYPREXA) injection 5 mg  5 mg Intramuscular Q8H PRN Massengill, Ovid Curd, MD      ? OLANZapine (ZYPREXA) tablet 5 mg  5 mg Oral Q8H PRN Massengill, Nathan, MD      ? propranolol ER (INDERAL LA) 24 hr capsule 60 mg  60 mg Oral Daily Lavella Hammock, MD      ? traZODone (DESYREL) tablet 50 mg  50 mg Oral QHS PRN Massengill, Ovid Curd, MD      ? ? ?Lab Results: No results found for this or any previous visit (from the past 48 hour(s)). ? ?Blood Alcohol level:  ?Lab Results  ?Component Value Date  ? ETH <10 09/23/2021  ? ETH <10 06/25/2021  ? ? ?Metabolic Disorder Labs: ?Lab Results  ?Component Value Date  ? HGBA1C 5.1 06/28/2021  ? MPG 99.67 06/28/2021  ? MPG 119.76 02/03/2018  ? ?No results found for: PROLACTIN ?Lab Results  ?Component Value Date  ? CHOL 212 (H) 06/28/2021  ? TRIG 56 06/28/2021  ? HDL 63 06/28/2021  ? CHOLHDL 3.4 06/28/2021  ? VLDL 11 06/28/2021  ? LDLCALC 138 (H)  06/28/2021  ? [place] 91 02/03/2018  ? ? ?Physical Findings: ?AIMS: Facial and Oral Movements ?Muscles of Facial Expression: None, normal ?Lips and Perioral Area: None, normal ?Jaw: None, normal ?Tongue: None, n

## 2021-10-10 NOTE — Group Note (Signed)
Social Work Group Therapy ?10/07/2021 ?3:00pm-4:00pm ? ?  ?Topic:  Mental Health Myths and Facts ?  ?Description of Group:   Due to staff recommendation and patient confinement to the hall and subsequent inability to enter the dayroom, psychotherapy group was not held.  Patient was provided with written information about mental health myths and facts.  Patient did not ask questions about the handoff. ? ?Selmer Dominion, MSW, LCSW ?10/07/2021 4:00pm ?

## 2021-10-11 NOTE — Progress Notes (Signed)
Patient has been isolative to her room and refusing her po medications. Writer encouraged her to try and patient became angry yelling at writer to get out of her room. She received IM medications tonight.  ?

## 2021-10-11 NOTE — Progress Notes (Signed)
[facility] MD Progress Note ? ?10/11/2021 12:17 PM ?Kaitlyn Duncan  ?MRN:  [id number] ?Subjective:   ?Kaitlyn Duncan is a 51 yr old female who presented to on 3/22 to [facility] under IVC with paranoia, hallucinations, and delusions.  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 ? ? ?Case was discussed in the multidisciplinary team. MAR was reviewed and patient was not compliant with medications requiring IM medications.  She refused her Propanolol and Claritin also.  ? ? ?Psychiatric Team made the following recommendations yesterday: ?-Forced Medication Over Objection Order Placed by Dr. Mamie Levers 3/26 and reapplied on 4/3 by Dr. Nelda Marseille ?-Received Prolixin Dec 25 mg on 4/6 for psychosis ?-Continue Prolixin 7.5 mg tablet or liquid PO or 2.5 mg IM BID for psychosis ?-Continue Zyprexa 10 mg PO or IM BID for psychosis ?-Continue Cogentin 2 mg IM BID PRN antipsychotic side effects ?-Continue Agitation Protocol: Zyprexa 5 mg PO or IM  ? ? ?When interviewing the patient this morning she was agitated saying she needed to eat her breakfast as it had been brought to her room a few minutes prior.  She refused to answer any questions and began insulting the interviewer and attempted to close the door on the interviewer.  She continued to refer to herself in the 3rd person and began chanting  "Degesibah degesibah degesibah."  When asked if she had any concerns she did not say any. ? ? ?Principal Problem: Schizophrenia ([place]) ?Diagnosis: Principal Problem: ?  Schizophrenia ([place]) ? ?Total Time spent with patient:  ?I personally spent 25 minutes on the unit in direct patient care. The direct patient care time included face-to-face time with the  patient, reviewing the patient's chart, communicating with other professionals, and coordinating care. Greater than 50% of this time was spent in counseling or coordinating care with the patient regarding goals of hospitalization, psycho-education, and discharge planning needs. ? ? 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 ? ?Past Medical History:  ?Past Medical History:  ?Diagnosis Date  ? Allergy   ? Anemia   ? Gallstones   ? GERD (gastroesophageal reflux disease)   ? Hemorrhoids   ? melanoma with met dz dx'd 01/2018  ? brain and adrenal  ? Seasonal allergies   ?  ?Past Surgical History:  ?Procedure Laterality Date  ? BIOPSY  01/28/2018  ? Procedure: BIOPSY;  Surgeon: Laurence Spates, MD;  Location: [facility];  Service: Endoscopy;;  ? BREAST BIOPSY Left   ? ESOPHAGOGASTRODUODENOSCOPY N/A 01/28/2018  ? Procedure: ESOPHAGOGASTRODUODENOSCOPY (EGD);  Surgeon: Laurence Spates, MD;  Location: [facility];  Service: Endoscopy;  Laterality: N/A;  ? MOUTH SURGERY    ? OVARY SURGERY    ? removed "something"  ? ?Family History:  ?Family History  ?Problem Relation Age of Onset  ? Melanoma Mother   ? Hypertension Father   ? Breast cancer Maternal Aunt   ? Breast cancer Paternal Aunt   ? Diabetes Paternal Aunt   ? Breast cancer  Maternal Grandmother   ? Breast cancer Paternal Grandmother   ? Diabetes Paternal Grandmother   ? Colon cancer Neg Hx   ? Esophageal cancer Neg Hx   ? Pancreatic cancer Neg Hx   ? Stomach cancer Neg Hx   ? Liver disease Neg Hx   ? Rectal cancer Neg Hx   ? ?Family Psychiatric  History: Could not assess ?Social History:  ?Social History  ? ?Substance and Sexual Activity  ?Alcohol Use No  ?    ?Social History  ? ?Substance and Sexual Activity  ?Drug Use No  ?  ?Social History  ? ?Socioeconomic History  ? Marital status: Single  ?  Spouse name: Not on file  ? Number of children: Not on file  ? Years of education: Not on file  ? Highest education level: Not on file  ?Occupational History  ? Not on file  ?Tobacco Use  ? Smoking status: Never  ? Smokeless tobacco: Never  ?Vaping Use  ? Vaping Use: Never used  ?Substance and Sexual Activity  ? Alcohol use: No  ? Drug use: No  ? Sexual activity: Not Currently  ?Other Topics Concern  ? Not on file  ?Social History Narrative  ? Not on file  ? ?Social Determinants of Health  ? ?Financial Resource Strain: Not on file  ?Food Insecurity: Not on file  ?Transportation Needs: Not on file  ?Physical Activity: Not on file  ?Stress: Not on file  ?Social Connections: Not on file  ? ?Additional Social History:  ?  ?  ?  ?  ?  ?  ?  ?  ?  ?  ?  ? ?Sleep:  Could not assess ? ?Appetite:   Could not assess ? ?Current Medications: ?Current Facility-Administered Medications  ?Medication Dose Route Frequency Provider Last Rate Last Admin  ? acetaminophen (TYLENOL) tablet 650 mg  650 mg Oral Q6H PRN White, Patrice L, NP   650 mg at 10/10/21 2348  ? alum & mag hydroxide-simeth (MAALOX/MYLANTA) 200-200-20 MG/5ML suspension 30 mL  30 mL Oral Q4H PRN White, Patrice L, NP      ? benztropine mesylate (COGENTIN) injection 2 mg  2 mg Intramuscular BID PRN Janine Limbo, MD      ? cetaphil lotion   Topical Daily Freida Busman, MD   Given at 10/08/21 [phone]  ? fluPHENAZine (PROLIXIN) tablet 7.5 mg  7.5 mg Oral BID Massengill, Ovid Curd, MD      ? Or  ? fluPHENAZine (PROLIXIN) 5 MG/ML solution 7.5 mg  7.5 mg Oral BID Massengill, Nathan, MD      ? Or  ? fluPHENAZine (PROLIXIN) injection 2.5 mg  2.5 mg Intramuscular BID Massengill, Ovid Curd, MD   2.5 mg at 10/11/21 1008  ? fluPHENAZine decanoate (PROLIXIN) injection 25 mg  25 mg Intramuscular Q14 Days Damita Dunnings B, MD   25 mg at 10/08/21  1111  ? hydrOXYzine (ATARAX) tablet 25 mg  25 mg Oral TID PRN White, Patrice L, NP      ? loratadine (CLARITIN) tablet 10 mg  10 mg Oral Daily Damita Dunnings B, MD   10 mg at 10/10/21 8341  ? magnesium hydroxide (MILK OF MAGNESIA) suspension 30 mL  30 mL Oral Daily PRN White, Patrice L, NP      ? OLANZapine (ZYPREXA) tablet 10 mg  10 mg Oral QHS Massengill, Ovid Curd, MD   10 mg at 10/07/21 2121  ? Or  ? OLANZapine (ZYPREXA) injection 10 mg  10 mg  Intramuscular QHS Janine Limbo, MD   10 mg at 10/10/21 2011  ? OLANZapine (ZYPREXA) injection 10 mg  10 mg Intramuscular Daily Damita Dunnings B, MD   10 mg at 10/11/21 1009  ? Or  ? OLANZapine (ZYPREXA) tablet 10 mg  10 mg Oral Daily Damita Dunnings B, MD      ? OLANZapine (ZYPREXA) injection 5 mg  5 mg Intramuscular Q8H PRN Massengill, Ovid Curd, MD      ? OLANZapine (ZYPREXA) tablet 5 mg  5 mg Oral Q8H PRN Massengill, Nathan, MD      ? propranolol ER (INDERAL LA) 24 hr capsule 60 mg  60 mg Oral Daily Lavella Hammock, MD      ? traZODone (DESYREL) tablet 50 mg  50 mg Oral QHS PRN Massengill, Ovid Curd, MD      ? ? ?Lab Results: No results found for this or any previous visit (from the past 48 hour(s)). ? ?Blood Alcohol level:  ?Lab Results  ?Component Value Date  ? ETH <10 09/23/2021  ? ETH <10 06/25/2021  ? ? ?Metabolic Disorder Labs: ?Lab Results  ?Component Value Date  ? HGBA1C 5.1 06/28/2021  ? MPG 99.67 06/28/2021  ? MPG 119.76 02/03/2018  ? ?No results found for: PROLACTIN ?Lab Results  ?Component Value Date  ? CHOL 212 (H) 06/28/2021  ? TRIG 56 06/28/2021  ? HDL 63 06/28/2021  ? CHOLHDL 3.4 06/28/2021  ? VLDL 11 06/28/2021  ? LDLCALC 138 (H) 06/28/2021  ? [place] 91 02/03/2018  ? ? ?Physical Findings: ?AIMS: Facial and Oral Movements ?Muscles of Facial Expression: None, normal ?Lips and Perioral Area: None, normal ?Jaw: None, normal ?Tongue: None, normal,Extremity Movements ?Upper (arms, wrists, hands, fingers): None, normal ?Lower (legs, knees, ankles, toes): None,  normal, Trunk Movements ?Neck, shoulders, hips: None, normal, Overall Severity ?Severity of abnormal movements (highest score from questions above): None, normal ?Incapacitation due to abnormal movements: None, n

## 2021-10-11 NOTE — Group Note (Signed)
[facility] LCSW Group Therapy Note ? ?Date/Time:  10/11/2021  11:00AM-12:00PM ? ?Type of Therapy and Topic:  Group Therapy:  Music and Mood ? ?Participation Level:  Did Not Attend  ? ?Description of Group: ?In this process group, members listened to a variety of genres of music and identified that different types of music evoke different responses.  Patients were encouraged to identify music that was soothing for them and music that was energizing for them.  Patients discussed how this knowledge can help with wellness and recovery in various ways including managing depression and anxiety as well as encouraging healthy sleep habits.   ? ?Therapeutic Goals: ?Patients will explore the impact of different varieties of music on mood ?Patients will verbalize the thoughts they have when listening to different types of music ?Patients will identify music that is soothing to them as well as music that is energizing to them ?Patients will discuss how to use this knowledge to assist in maintaining wellness and recovery ?Patients will explore the use of music as a coping skill ? ?Summary of Patient Progress:  The patient was invited to group face-to-face by MHT but declined to attend. ? ?Therapeutic Modalities: ?Solution Focused Brief Therapy ?Activity ? ? ?Selmer Dominion, LCSW ?  ?

## 2021-10-11 NOTE — Progress Notes (Signed)
Patient remain non-compliant with oral routine medication. Staff had to give Injection after several attempts to offer oral medication and patient refused. Patient is isolative in room, she is still hyper religious and hostile toward the staff. No acute distress noted at this time. Q 15 minutes safety observation in place. Staff will continue to support patient emotional and physical health. ? 10/11/21 0800  ?Psych Admission Type (Psych Patients Only)  ?Admission Status Involuntary  ?Psychosocial Assessment  ?Patient Complaints Agitation;Anger;Irritability;Isolation  ?Eye Contact Avoids  ?Facial Expression Angry  ?Affect Angry;Irritable;Preoccupied  ?Speech Aggressive;Argumentative;Loud  ?Interaction Minimal  ?Motor Activity Slow  ?Appearance/Hygiene Disheveled  ?Behavior Characteristics Appropriate to situation;Resistant to care  ?Mood Labile;Anxious  ?Aggressive Behavior  ?Targets Other (Comment) ?Engineer, civil (consulting))  ?Thought Process  ?Coherency Blocking;Disorganized  ?Content Blaming others;Religiosity  ?Delusions Religious;Persecutory;Grandeur  ?Perception Derealization  ?Hallucination None reported or observed  ?Judgment Impaired  ?Confusion Mild  ?Danger to Self  ?Current suicidal ideation? Denies  ?Danger to Others  ?Danger to Others None reported or observed  ?Danger to Others Abnormal  ?Harmful Behavior to others No threats or harm toward other people  ? ? ?

## 2021-10-11 NOTE — Plan of Care (Signed)
?  Problem: Safety: ?Goal: Non-violent Restraint(s) ?Outcome: Progressing ?  ?Problem: Coping: ?Goal: Coping ability will improve ?Outcome: Progressing ?Goal: Will verbalize feelings ?Outcome: Progressing ?  ?Problem: Safety: ?Goal: Ability to redirect hostility and anger into socially appropriate behaviors will improve ?Outcome: Progressing ?Goal: Ability to remain free from injury will improve ?Outcome: Progressing ?  ?

## 2021-10-12 ENCOUNTER — Encounter (HOSPITAL_COMMUNITY): Payer: Self-pay

## 2021-10-12 MED ORDER — CETAPHIL MOISTURIZING EX LOTN: TOPICAL_LOTION | Freq: Every day | CUTANEOUS | 0 refills | Status: DC

## 2021-10-12 MED ORDER — OLANZAPINE 10 MG PO TABS: 10.0000 mg | ORAL_TABLET | Freq: Two times a day (BID) | ORAL | 0 refills | Status: DC

## 2021-10-12 MED ORDER — FLUPHENAZINE DECANOATE 25 MG/ML IJ SOLN: 25.0000 mg | INTRAMUSCULAR | Status: DC

## 2021-10-12 MED ORDER — LORATADINE 10 MG PO TABS: 10.0000 mg | ORAL_TABLET | Freq: Every day | ORAL | Status: DC

## 2021-10-12 MED ORDER — FLUPHENAZINE HCL 2.5 MG PO TABS: 7.5000 mg | ORAL_TABLET | Freq: Two times a day (BID) | ORAL | 0 refills | Status: DC

## 2021-10-12 NOTE — Discharge Summary (Signed)
Physician Discharge Summary Note ? ?Patient:  Kaitlyn Duncan is an 51 y.o., female ?MRN:  [id number] ?DOB:  10/02/1970 ?Patient phone:  [phone] (home)  ?Patient address:   ?Homeless ?[facility] ?Currently Resides At In [place] As Of 08/17/21 ?[place] [address],  ?Total Time spent with patient: 20 minutes ? ?Date of Admission:  09/25/2021 ?Date of Discharge: 10/12/2021 ? ?Reason for Admission:  Kaitlyn Duncan is a 51 yr old female who presented to on 3/22 to [facility] under IVC with paranoia, hallucinations, and delusions.  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 ? ?Principal Problem: Schizophrenia ([place]) ?Discharge Diagnoses: Principal Problem: ?  Schizophrenia ([place]) ? ? 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 ? ?Past Medical History:  ?Past Medical History:  ?Diagnosis Date  ? Allergy   ? Anemia   ? Gallstones   ? GERD (gastroesophageal reflux disease)   ? Hemorrhoids   ? melanoma with met dz dx'd 01/2018  ? brain and adrenal  ? Seasonal allergies   ?  ?Past Surgical History:  ?Procedure Laterality Date  ? BIOPSY  01/28/2018  ? Procedure: BIOPSY;  Surgeon: Laurence Spates, MD;  Location: [facility];  Service: Endoscopy;;  ? BREAST BIOPSY Left   ? ESOPHAGOGASTRODUODENOSCOPY N/A 01/28/2018  ? Procedure: ESOPHAGOGASTRODUODENOSCOPY (EGD);  Surgeon: Laurence Spates, MD;  Location:  [facility];  Service: Endoscopy;  Laterality: N/A;  ? MOUTH SURGERY    ? OVARY SURGERY    ? removed "something"  ? ?Family History:  ?Family History  ?Problem Relation Age of Onset  ? Melanoma Mother   ? Hypertension Father   ? Breast cancer Maternal Aunt   ? Breast cancer Paternal Aunt   ? Diabetes Paternal Aunt   ? Breast cancer Maternal Grandmother   ? Breast cancer Paternal Grandmother   ? Diabetes Paternal Grandmother   ? Colon cancer Neg Hx   ? Esophageal cancer Neg Hx   ? Pancreatic cancer Neg Hx   ? Stomach cancer Neg Hx   ? Liver disease Neg Hx   ? Rectal cancer Neg Hx   ? ?Family Psychiatric  History: Unknown ?Social History:  ?Social History  ? ?Substance and Sexual Activity  ?Alcohol Use No  ?   ?Social History  ? ?Substance and Sexual Activity  ?Drug Use No  ?  ?Social History  ? ?Socioeconomic History  ? Marital status: Single  ?  Spouse name: Not on file  ? Number of children: Not on file  ? Years of education: Not on file  ? Highest education level: Not on file  ?Occupational History  ? Not on file  ?Tobacco Use  ? Smoking status: Never  ? Smokeless tobacco: Never  ?Vaping Use  ? Vaping Use: Never used  ?Substance and Sexual Activity  ? Alcohol use: No  ? Drug use: No  ? Sexual activity: Not Currently  ?Other Topics Concern  ? Not on file  ?Social  History Narrative  ? Not on file  ? ?Social Determinants of Health  ? ?Financial Resource Strain: Not on file  ?Food Insecurity: Not on file  ?Transportation Needs: Not on file  ?Physical Activity: Not on file  ?Stress: Not on file  ?Social Connections: Not on file  ? ? ?Hospital Course:  Kaitlyn Duncan is a 51 yr old female who presented to on 3/22 to [facility] under IVC with paranoia, hallucinations, and delusions.  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 ?During the patient's hospitalization, patient had extensive initial psychiatric evaluation, and follow-up psychiatric evaluations every day. ?  ?Psychiatric diagnoses provided during hospitalization:  ?Schizophrenia versus psychotic disorder due to general medical condition - metatstatic melanoma ? ?Patient was very argumentative and agitated on presentation in the hospital sally port and required PRNs for intake: Haldol 77m IM, Ativan 178mIM ?  ?Patient's psychiatric medications were adjusted on admission:  ?- Patient restarted on Prolxin 1056mID ?- patient restarted on Cogentin 0.5mg81mD ?However, patient refused medications and was deemed that her illness was preventing her from making appropriate decisions regarding her health and placed on Forced medications (3/26), renewed (4/3) ?  ?  ?During the hospitalization, other adjustments were made to the patient's psychiatric medication regimen:  ?-Cogentin 0.5mg 54m was discontinued ?-Prolixin dose was adjusted to: 2.5mg/ 61mmg/ 571mat 5m and 12pm, and 5mg/5mg/17mg at bedtime   (forced med)  ?-Zyprexa was started and increased to 10 mg BID IM or PO (forced med). ?- Additional antipsychotic was added due to patient's hx of failed antipsychotic monotherapy treatments (Prolixin, Geodon, Zyprexa, Seroquel, and Haldol) and patient was noted to have very limited improvement on Prolixin as a monotherapy over the first 4-5 days. Patient noted to have improvement with dual antipsychotic therapy as she became slightly less argumentative, less isolative, less withdrawn, and slightly less preoccupied with her delusions.  ?  ?- Patient guardian requested that at least one antipsychotic treatment be available in LAI form.Kaitlyn Duncan was deemed appropriate due to patient's history of poor compliance and severity of illness. Patient received 25mg Prol66m (4/6) and is due for her next dose  (4/20). Pt to continue adjusted dose of prolixin for a total of 7 days, until 10/15/21. (Prolixin adjusted to 7.5mg BID PO44m 2.5mg IM) ?  19madually, patient started adjusting to milieu and would spend time in the dayroom.   ?Patient's care was discussed during the interdisciplinary team meeting every day during the hospitalization. ?  ?The patient denied having side effects to prescribed psychiatric medication. ?  ?The patient psychiatric symptoms of withdrawn behavior responded well to the psychiatric medications. Patient continued to have hyper- religious delusions and preoccupations, but she was noted to have more conversation than on admission and was slightly less irritable. Patient was eating and sleeping very well by time of discharge. Patient was also more willing to answer safety questions and interact on the unit. Supportive psychotherapy was provided to the patient.  ?  ?Labs were reviewed with the patient, and abnormal results were discussed with the patient. ?  ?The patient denied having suicidal thoughts more than 48 hours prior to discharge.  Patient denies having homicidal thoughts.  Patient denies having auditory hallucinations.  Patient  denies any visual hallucinations.  Patient denies having paranoid thoughts. ?  ?It is recommended to the patient to continue psychiatric medications as prescribed, after discharge from the hospital.   ?  ?It is recommended to the patient to follow up with your outpatient psychiatric provider and PCP. ?  ?Total abstinence from substance use was recommended the patient.  ? ? ?Physical Findings: ?AIMS: Facial and Oral Movements ?Muscles of Facial Expression: None, normal ?Lips and Perioral Area: None, normal ?Jaw: None, normal ?Tongue: None, normal,Extremity Movements ?Upper (arms, wrists, hands, fingers): None, normal ?Lower (legs, knees, ankles, toes): None, normal, Trunk Movements ?Neck, shoulders, hips: None, normal, Overall Severity ?Severity of abnormal  movements (highest score from questions above): None, normal ?Incapacitation due to abnormal movements: None, normal ?Patient's awareness of abnormal movements (rate only patient's report): No Awareness, [place]

## 2021-10-12 NOTE — Progress Notes (Signed)
?  [facility] Adult Case Management Discharge Plan : ? ?Will you be returning to the same living situation after discharge:  No. Will be staying in hotel ?At discharge, do you have transportation home?: Yes,  guardian is to pick this patient up ?Do you have the ability to pay for your medications: Yes,  has insurance ? ?Release of information consent forms completed and in the chart;  Patient's signature needed at discharge. ? ?Patient to Follow up at: ? Follow-up Information   ? ? [facility] Follow up.   ?Why: Patient is linked with the ACTT Team. ?Contact information: ?[address] ?[address] ?[place] [address] ?[phone] ? ? ?  ?  ? ? [facility] Follow up.   ?Specialty: Behavioral Health ?Why: Referral made ?Contact information: ?[address] ?[place] ?[phone] ? ?  ?  ? ?  ?  ? ?  ? ? ?Next level of care provider has access to [place] ? ?Safety Planning and Suicide Prevention discussed: Yes,  with guardian ? ?  ? ?Has patient been referred to the Quitline?: N/A patient is not a smoker ? ?Patient has been referred for addiction treatment: N/A ? ?Vassie Moselle, LCSW ?10/12/2021, 9:13 AM ?

## 2021-10-12 NOTE — Group Note (Signed)
Recreation Therapy Group Note ? ? ?Group Topic:Team Building  ?Group Date: 10/12/2021 ?Start Time: 1005 ?End Time: 6160 ?Facilitators: Victorino Sparrow, LRT,CTRS ?Location: [place] ? ? ?Goal Area(s) Addresses:  ?Patient will effectively work with peer towards shared goal.  ?Patient will identify skills used to make activity successful.  ?Patient will identify how skills used during activity can be used to reach post d/c goals.  ? ?Group Description: Straw Bridge. In teams of 3-5, patients were given 15 plastic drinking straws and an equal length of masking tape. Using the materials provided, patients were instructed to build a free standing bridge-like structure to suspend an everyday item (ex: puzzle box) off of the floor or table surface. All materials were required to be used by the team in their design. LRT facilitated post-activity discussion reviewing team process. Patients were encouraged to reflect how the skills used in this activity can be generalized to daily life post discharge.  ? ? ?Affect/Mood: N/A ?  ?Participation Level: Did not attend ?  ? ?Clinical Observations/Individualized Feedback:  ? ? ?Plan: Continue to engage patient in RT group sessions 2-3x/week. ? ? ?Victorino Sparrow, LRT,CTRS ?10/12/2021 1:31 PM ?

## 2021-10-12 NOTE — [place] FL2 (Signed)
?[place] MEDICAID FL2 LEVEL OF CARE SCREENING TOOL  ?  ? ?IDENTIFICATION  ?Patient Name: ?Kaitlyn Duncan Birthdate: December 08, 1970 Sex: female Admission Date (Current Location): ?09/25/2021  ?[place] and [place] Number: ? [place] ?[id number] L Facility and Address:  ?[facility], [place] [address], [place], [place] [address] ?     Provider Number: ?[id number]  ?Attending Physician Name and Address:  ?Maida Sale, MD ? Relative Name and Phone Number:  ?Legal Arvella Merles [phone]; [phone] ?   ?Current Level of Care: ?Hospital Recommended Level of Care: ?Assisted Living Facility, Family Care Home (Group Home) Prior Approval Number: ?  ? ?Date Approved/Denied: ?  PASRR Number: ?  ? ?Discharge Plan: ?Other (Comment) (FCH/Group Home) ?  ? ?Current Diagnoses: ?Patient Active Problem List  ? Diagnosis Date Noted  ? Schizophrenia ([place]) 09/25/2021  ? Leukopenia 06/27/2021  ? Thrombocytopenia ([place]) 06/27/2021  ? Schizophrenia spectrum disorder with psychotic disorder type not yet determined ([place]) 06/26/2021  ? Has difficulty accessing primary care provider for most visits 06/15/2021  ? Malingering 01/23/2021  ? Candidiasis of skin 01/23/2021  ? Musculoskeletal pain 01/23/2021  ? Psychosis ([place])   ? 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 Melanoma of skin ([place]) 05/29/2018  ? Goals of care, counseling/discussion 05/29/2018  ? Brain metastasis 01/26/2018  ? ? ?Orientation RESPIRATION BLADDER Height & Weight   ?  ?Self, Time, Place, Situation ? Normal Continent Weight:   ?Height:     ?BEHAVIORAL SYMPTOMS/MOOD NEUROLOGICAL BOWEL NUTRITION STATUS  ?Verbally abusive   Continent Diet (Normal)  ?AMBULATORY STATUS COMMUNICATION OF NEEDS Skin   ?Independent Verbally Normal ?  ?  ?  ?    ?     ?     ? ? ?Personal Care Assistance Level of Assistance  ?Bathing, Feeding, Dressing, Total care Bathing Assistance: Independent ?Feeding assistance:  Independent ?Dressing Assistance: Independent ?Total Care Assistance: Independent  ? ?Functional Limitations Info  ?Sight, Hearing, Speech Sight Info: Adequate ?Hearing Info: Adequate ?Speech Info: Adequate  ? ? ?SPECIAL CARE FACTORS FREQUENCY  ?    ?  ?  ?  ?  ?  ?  ?   ? ? ?Contractures Contractures Info: Not present  ? ? ?Additional Factors Info  ?Code Status, Allergies Code Status Info: FULL ?Allergies Info: No known allergies ?  ?  ?  ?   ? ?Current Medications (10/12/2021):  This is the current hospital active medication list ?Current Facility-Administered Medications  ?Medication Dose Route Frequency Provider Last Rate Last Admin  ? acetaminophen (TYLENOL) tablet 650 mg  650 mg Oral Q6H PRN White, Patrice L, NP   650 mg at 10/10/21 2348  ? alum & mag hydroxide-simeth (MAALOX/MYLANTA) 200-200-20 MG/5ML suspension 30 mL  30 mL Oral Q4H PRN White, Patrice L, NP      ? benztropine mesylate (COGENTIN) injection 2 mg  2 mg Intramuscular BID PRN Janine Limbo, MD      ? cetaphil lotion   Topical Daily Freida Busman, MD   Given at 10/12/21 0801  ? fluPHENAZine (PROLIXIN) tablet 7.5 mg  7.5 mg Oral BID Massengill, Ovid Curd, MD      ? Or  ? fluPHENAZine (PROLIXIN) 5 MG/ML solution 7.5 mg  7.5 mg Oral BID Massengill, Nathan, MD      ? Or  ? fluPHENAZine (PROLIXIN) injection 2.5 mg  2.5 mg Intramuscular BID Massengill, Ovid Curd, MD   2.5 mg at 10/11/21 2016  ? fluPHENAZine  decanoate (PROLIXIN) injection 25 mg  25 mg Intramuscular Q14 Days Damita Dunnings B, MD   25 mg at 10/08/21 1111  ? hydrOXYzine (ATARAX) tablet 25 mg  25 mg Oral TID PRN White, Patrice L, NP      ? loratadine (CLARITIN) tablet 10 mg  10 mg Oral Daily Damita Dunnings B, MD   10 mg at 10/10/21 6579  ? magnesium hydroxide (MILK OF MAGNESIA) suspension 30 mL  30 mL Oral Daily PRN White, Patrice L, NP      ? OLANZapine (ZYPREXA) tablet 10 mg  10 mg Oral QHS Massengill, Ovid Curd, MD   10 mg at 10/07/21 2121  ? Or  ? OLANZapine (ZYPREXA) injection 10 mg  10 mg  Intramuscular QHS Massengill, Ovid Curd, MD   10 mg at 10/11/21 2015  ? OLANZapine (ZYPREXA) injection 10 mg  10 mg Intramuscular Daily Damita Dunnings B, MD   10 mg at 10/11/21 1009  ? Or  ? OLANZapine (ZYPREXA) tablet 10 mg  10 mg Oral Daily Damita Dunnings B, MD      ? OLANZapine (ZYPREXA) injection 5 mg  5 mg Intramuscular Q8H PRN Massengill, Nathan, MD      ? OLANZapine (ZYPREXA) tablet 5 mg  5 mg Oral Q8H PRN Massengill, Nathan, MD      ? propranolol ER (INDERAL LA) 24 hr capsule 60 mg  60 mg Oral Daily Lavella Hammock, MD      ? traZODone (DESYREL) tablet 50 mg  50 mg Oral QHS PRN Janine Limbo, MD      ? ? ? ?Discharge Medications: ?Please see discharge summary for a list of discharge medications. ? ?Relevant Imaging Results: ? ?Relevant Lab Results: ? ? ?Additional Information ?SSN [id number] ? ?Vassie Moselle, LCSW ? ? ? ? ?

## 2021-10-12 NOTE — Progress Notes (Signed)
Discharge note: RN met with pt and reviewed pt's discharge instructions. Pt verbalized understanding of discharge instructions and pt did not have any questions. RN reviewed and provided pt with a copy of SRA, AVS and Transition Record. RN returned pt's belongings to pt. Prescriptions were given to guardian. Pt denied SI/HI/AVH and voiced no concerns. Pt was appreciative of the care pt received at [facility]. Patient discharged to the lobby without incident. Guardian received paperwork and signed forms. ? 10/12/21 0801  ?Psych Admission Type (Psych Patients Only)  ?Admission Status Involuntary  ?Psychosocial Assessment  ?Patient Complaints Other (Comment) ?(does not want meds)  ?Eye Contact Avertive  ?Facial Expression Angry  ?Affect Angry;Preoccupied  ?Speech Loud;Argumentative  ?Interaction Isolative;Forwards little  ?Motor Activity Slow  ?Appearance/Hygiene Disheveled  ?Behavior Characteristics Agitated;Unwilling to participate;Agressive verbally  ?Mood Labile;Preoccupied;Irritable  ?Thought Process  ?Coherency Disorganized  ?Content Religiosity;Blaming others;Preoccupation  ?Delusions Religious;Persecutory;Grandeur  ?Perception WDL  ?Hallucination None reported or observed  ?Judgment Impaired  ?Confusion Mild  ?Danger to Self  ?Current suicidal ideation? Denies  ?Danger to Others  ?Danger to Others None reported or observed  ?Danger to Others Abnormal  ?Harmful Behavior to others No threats or harm toward other people  ?Destructive Behavior No threats or harm toward property  ? ? ?

## 2021-10-12 NOTE — BH IP Treatment Plan (Signed)
Interdisciplinary Treatment and Diagnostic Plan Update ? ?10/12/2021 ?Time of Session: 9:25am ?Kaitlyn Duncan ?MRN: [id number] ? ?Principal Diagnosis: Schizophrenia ([place]) ? ?Secondary Diagnoses: Principal Problem: ?  Schizophrenia ([place]) ? ? ?Current Medications:  ?Current Facility-Administered Medications  ?Medication Dose Route Frequency Provider Last Rate Last Admin  ? acetaminophen (TYLENOL) tablet 650 mg  650 mg Oral Q6H PRN White, Patrice L, NP   650 mg at 10/10/21 2348  ? alum & mag hydroxide-simeth (MAALOX/MYLANTA) 200-200-20 MG/5ML suspension 30 mL  30 mL Oral Q4H PRN White, Patrice L, NP      ? benztropine mesylate (COGENTIN) injection 2 mg  2 mg Intramuscular BID PRN Janine Limbo, MD      ? cetaphil lotion   Topical Daily Freida Busman, MD   Given at 10/12/21 0801  ? fluPHENAZine (PROLIXIN) tablet 7.5 mg  7.5 mg Oral BID Massengill, Ovid Curd, MD      ? Or  ? fluPHENAZine (PROLIXIN) 5 MG/ML solution 7.5 mg  7.5 mg Oral BID Massengill, Nathan, MD      ? Or  ? fluPHENAZine (PROLIXIN) injection 2.5 mg  2.5 mg Intramuscular BID Massengill, Ovid Curd, MD   2.5 mg at 10/12/21 1009  ? fluPHENAZine decanoate (PROLIXIN) injection 25 mg  25 mg Intramuscular Q14 Days Damita Dunnings B, MD   25 mg at 10/08/21 1111  ? hydrOXYzine (ATARAX) tablet 25 mg  25 mg Oral TID PRN White, Patrice L, NP      ? loratadine (CLARITIN) tablet 10 mg  10 mg Oral Daily Damita Dunnings B, MD   10 mg at 10/12/21 4650  ? magnesium hydroxide (MILK OF MAGNESIA) suspension 30 mL  30 mL Oral Daily PRN White, Patrice L, NP      ? OLANZapine (ZYPREXA) tablet 10 mg  10 mg Oral QHS Massengill, Ovid Curd, MD   10 mg at 10/07/21 2121  ? Or  ? OLANZapine (ZYPREXA) injection 10 mg  10 mg Intramuscular QHS Massengill, Ovid Curd, MD   10 mg at 10/11/21 2015  ? OLANZapine (ZYPREXA) injection 10 mg  10 mg Intramuscular Daily Damita Dunnings B, MD   10 mg at 10/12/21 1012  ? Or  ? OLANZapine (ZYPREXA) tablet 10 mg  10 mg Oral Daily Damita Dunnings B, MD      ?  OLANZapine (ZYPREXA) injection 5 mg  5 mg Intramuscular Q8H PRN Massengill, Nathan, MD      ? OLANZapine (ZYPREXA) tablet 5 mg  5 mg Oral Q8H PRN Massengill, Nathan, MD      ? propranolol ER (INDERAL LA) 24 hr capsule 60 mg  60 mg Oral Daily Lavella Hammock, MD      ? traZODone (DESYREL) tablet 50 mg  50 mg Oral QHS PRN Janine Limbo, MD      ? ?PTA Medications: ?No medications prior to admission.  ? ? ?Patient Stressors:   ? ?Patient Strengths:   ? ?Treatment Modalities: Medication Management, Group therapy, Case management,  ?1 to 1 session with clinician, Psychoeducation, Recreational therapy. ? ? ?Physician Treatment Plan for Primary Diagnosis: Schizophrenia ([place]) ?Long Term Goal(s): Improvement in symptoms so as ready for discharge  ? ?Short Term Goals: Ability to identify changes in lifestyle to reduce recurrence of condition will improve ?Ability to verbalize feelings will improve ?Ability to demonstrate self-control will improve ?Ability to identify and develop effective coping behaviors will improve ?Ability to maintain clinical measurements within normal limits will improve ?Compliance with prescribed medications will improve ?Ability to identify triggers associated with  substance abuse/mental health issues will improve ? ?Medication Management: Evaluate patient's response, side effects, and tolerance of medication regimen. ? ?Therapeutic Interventions: 1 to 1 sessions, Unit Group sessions and Medication administration. ? ?Evaluation of Outcomes: Adequate for Discharge ? ?Physician Treatment Plan for Secondary Diagnosis: Principal Problem: ?  Schizophrenia ([place]) ? ?Long Term Goal(s): Improvement in symptoms so as ready for discharge  ? ?Short Term Goals: Ability to identify changes in lifestyle to reduce recurrence of condition will improve ?Ability to verbalize feelings will improve ?Ability to demonstrate self-control will improve ?Ability to identify and develop effective coping behaviors will  improve ?Ability to maintain clinical measurements within normal limits will improve ?Compliance with prescribed medications will improve ?Ability to identify triggers associated with substance abuse/mental health issues will improve    ? ?Medication Management: Evaluate patient's response, side effects, and tolerance of medication regimen. ? ?Therapeutic Interventions: 1 to 1 sessions, Unit Group sessions and Medication administration. ? ?Evaluation of Outcomes: Adequate for Discharge ? ? ?RN Treatment Plan for Primary Diagnosis: Schizophrenia ([place]) ?Long Term Goal(s): Knowledge of disease and therapeutic regimen to maintain health will improve ? ?Short Term Goals: Ability to remain free from injury will improve, Ability to verbalize frustration and anger appropriately will improve, Ability to demonstrate self-control, Ability to participate in decision making will improve, and Compliance with prescribed medications will improve ? ?Medication Management: RN will administer medications as ordered by provider, will assess and evaluate patient's response and provide education to patient for prescribed medication. RN will report any adverse and/or side effects to prescribing provider. ? ?Therapeutic Interventions: 1 on 1 counseling sessions, Psychoeducation, Medication administration, Evaluate responses to treatment, Monitor vital signs and CBGs as ordered, Perform/monitor CIWA, COWS, AIMS and Fall Risk screenings as ordered, Perform wound care treatments as ordered. ? ?Evaluation of Outcomes: Adequate for Discharge ? ? ?LCSW Treatment Plan for Primary Diagnosis: Schizophrenia ([place]) ?Long Term Goal(s): Safe transition to appropriate next level of care at discharge, Engage patient in therapeutic group addressing interpersonal concerns. ? ?Short Term Goals: Engage patient in aftercare planning with referrals and resources, Increase social support, Increase ability to appropriately verbalize feelings, Identify triggers  associated with mental health/substance abuse issues, and Increase skills for wellness and recovery ? ?Therapeutic Interventions: Assess for all discharge needs, 1 to 1 time with Education officer, museum, Explore available resources and support systems, Assess for adequacy in community support network, Educate family and significant other(s) on suicide prevention, Complete Psychosocial Assessment, Interpersonal group therapy. ? ?Evaluation of Outcomes: Adequate for Discharge ? ? ?Progress in Treatment: ?Attending groups: No. ?Participating in groups: No.  ?Taking medication as prescribed: No. ?Toleration medication: Yes. ?Family/Significant other contact made: Yes, havel contacted:  legal guardian ?Patient understands diagnosis: No. ?Discussing patient identified problems/goals with staff: Yes. ?Medical problems stabilized or resolved: Yes. ?Denies suicidal/homicidal ideation: Yes. ?Issues/concerns per patient self-inventory: No. ?  ?  ?New problem(s) identified: No, Describe:  none ?  ?New Short Term/Long Term Goal(s): medication stabilization, elimination of SI thoughts, development of comprehensive mental wellness plan.  ?  ?  ?Patient Goals:  "To get out" ?  ?Discharge Plan or Barriers: Patient is currently receiving ACTT services through [facility]. Pt's guardian is to pick patient up at discharge. An outpatient commitment has been filed on pt's behalf. ?  ?Reason for Continuation of Hospitalization:  ?Delusions  ?Hallucinations ?Medication stabilization ?  ?Estimated Length of Stay: Adequate for discharge ? ?Last 3 [place] Suicide Severity Risk Score: ?Flowsheet Row Admission (Discharged) from 06/26/2021 in [facility] from 06/25/2021 in [facility] ED from 06/24/2021 in [facility] DEPT  ?C-SSRS RISK CATEGORY No Risk No Risk Error: Q3, 4, or 5 should not be populated when Q2 is No  ? ?  ? ? ?Last PHQ 2/9 Scores: ?   ? View : No  data to display.  ?  ?  ?  ? ? ?Scribe for Treatment Team: ?Vassie Moselle, LCSW ?10/12/2021 ?10:15 AM ? ? ?

## 2021-10-12 NOTE — BHH Suicide Risk Assessment (Signed)
Suicide Risk Assessment ? ?Discharge Assessment    ?[facility] Discharge Suicide Risk Assessment ? ? ?Principal Problem: Schizophrenia ([place]) ?Discharge Diagnoses: Principal Problem: ?  Schizophrenia ([place]) ? ? ?Total Time spent with patient: 20 minutes ?Kaitlyn Duncan is a 51 yr old female who presented to on 3/22 to [facility] under IVC with paranoia, hallucinations, and delusions.  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 ? ?During the patient's hospitalization, patient had extensive initial psychiatric evaluation, and follow-up psychiatric evaluations every day. ? ?Psychiatric diagnoses provided during hospitalization:  ?Schizophrenia versus psychotic disorder due to general medical condition - metatstatic melanoma ? ?Patient was very argumentative and agitated on presentation in the hospital sally port and required PRNs for intake: Haldol '5mg'$  IM, Ativan '1mg'$  IM ? ?Patient's psychiatric medications were adjusted on admission:  ?- Patient restarted on Prolxin '10mg'$  BID ?- patient restarted on Cogentin 0.'5mg'$  BID ?However, patient refused medications and was deemed that her illness was preventing her from making appropriate decisions regarding her health and placed on Forced medications (3/26), renewed (4/3) ? ? ?During the hospitalization, other adjustments were made to the patient's psychiatric medication regimen:  ?-Cogentin 0.'5mg'$  TID was discontinued ?-Prolixin dose was adjusted to: 2.'5mg'$ / 2.'5mg'$ / '5mg'$  at 8am and 12pm, and '5mg'$ /'5mg'$ /'10mg'$  at bedtime   (forced med)  ?-Zyprexa was started and increased to 10 mg BID IM or PO (forced med). ?- Additional antipsychotic was added due to patient's hx of failed antipsychotic monotherapy treatments  (Prolixin, Geodon, Zyprexa, Seroquel, and Haldol) and patient was noted to have very limited improvement on Prolixin as a monotherapy over the first 4-5 days. Patient noted to have improvement with dual antipsychotic therapy as she became slightly less argumentative, less isolative, less withdrawn, and slightly less preoccupied with her delusions.  ? ?- Patient guardian requested that at least one antipsychotic treatment be available in [place] form. This was deemed appropriate due to patient's history of poor compliance and severity of illness. Patient received '25mg'$  Prolixin (4/6) and is due for her next dose (4/20). Pt to continue adjusted dose of prolixin for a total of 7 days, until 10/15/21. (Prolixin adjusted to 7.'5mg'$  BID PO or 2.'5mg'$  IM) ? ?Gradually, patient started adjusting to milieu and would spend time in the dayroom.   ?Patient's care was discussed during the interdisciplinary team meeting every day during the hospitalization. ? ?The patient denied having side effects to prescribed psychiatric medication. ? ?The patient psychiatric symptoms of withdrawn behavior responded well to the psychiatric medications. Patient continued to have hyper- religious delusions and preoccupations, but she was noted to have more conversation than on admission and was slightly less irritable. Patient was eating and sleeping very well by time of discharge. Patient was also more willing to answer safety questions and interact on the unit. Supportive psychotherapy was provided to the patient.  ? ?Labs were reviewed with the patient, and abnormal results were discussed with the patient. ? ?The patient denied having suicidal thoughts more than 48 hours prior to discharge.  Patient denies having homicidal thoughts.  Patient denies having auditory hallucinations.  Patient denies any visual hallucinations.  Patient denies having paranoid thoughts. ? ?It is recommended to the patient to continue psychiatric medications as prescribed,  after discharge from the hospital.   ? ?  It is recommended to the patient to follow up with your outpatient psychiatric provider and PCP. ? ?Total abstinence from substance use was recommended the patient.  ? ?Musculoskeletal: ?Strength & Muscle Tone: within normal limits ?Gait & Station: normal ?Patient leans: N/A ? ?Psychiatric Specialty Exam ? ?Presentation  ?General Appearance: Appropriate for Environment; Casual ? ?Eye Contact:Good ? ?Speech:Clear and Coherent ? ?Speech Volume:Normal ? ?Handedness:Right ? ? ?Mood and Affect  ?Mood:Irritable ? ?Duration of Depression Symptoms: -- (UTA, refusing to speak) ? ?Affect:Appropriate; Congruent ? ? ?Thought Process  ?Thought Processes:Linear ? ?Descriptions of Associations:Circumstantial ? ?Orientation:Full (Time, Place and Person) ? ?Thought Content:Illogical; Delusions ? ?History of Schizophrenia/Schizoaffective disorder:Yes ? ?Duration of Psychotic Symptoms:Greater than six months ? ?Hallucinations:Hallucinations: None ? ?Ideas of Reference:Delusions ? ?Suicidal Thoughts:Suicidal Thoughts: No ? ?Homicidal Thoughts:Homicidal Thoughts: No ? ? ?Sensorium  ?Memory:Immediate Fair; Recent Poor ? ?Judgment:Impaired ? ?Insight:None ? ? ?Executive Functions  ?Concentration:Fair ? ?Attention Span:Fair ? ?Recall:Poor ? ?[place] ? ?Language:Fair ? ? ?Psychomotor Activity  ?Psychomotor Activity:Psychomotor Activity: Normal ? ? ?Assets  ?Assets:Resilience; Social Support ? ? ?Sleep  ?Sleep:Sleep: Good ?Number of Hours of Sleep: 6.25 ? ? ?Physical Exam: ?Physical Exam ?Constitutional:   ?   Appearance: Normal appearance.  ?HENT:  ?   Head: Normocephalic and atraumatic.  ?Pulmonary:  ?   Effort: Pulmonary effort is normal.  ?Neurological:  ?   Mental Status: She is alert and oriented to person, place, and time.  ? ?Review of Systems  ?Psychiatric/Behavioral:  Negative for hallucinations and suicidal ideas.   ? ?Blood pressure 132/84, pulse 90, temperature 98.1 ?F (36.7  ?C), temperature source Oral, resp. rate 16, SpO2 99 %. There is no height or weight on file to calculate BMI. ? ?Mental Status Per Nursing Assessment::   ?On Admission:    ? ?Demographic Factors:  ?Caucasian and Unemployed ? ?Loss Factors: ?NA ? ?Historical Factors: ?NA ? ?Risk Reduction Factors:   ?Positive social support ? ?Continued Clinical Symptoms:  ?Schizophrenia:   Paranoid or undifferentiated type ? ?Cognitive Features That Contribute To Risk:  ?Closed-mindedness   ? ?Suicide Risk:  ?Minimal: No identifiable suicidal ideation.  Patients presenting with no risk factors but with morbid ruminations; may be classified as minimal risk based on the severity of the depressive symptoms ? ? Follow-up Information   ? ? [facility] Follow up.   ?Why: Patient is linked with the ACTT Team. ?Contact information: ?[address] ?[address] ?[place] [address] ?[phone] ? ? ?  ?  ? ? [facility] Follow up.   ?Specialty: Behavioral Health ?Why: Referral made ?Contact information: ?[address] ?[place] ?[phone] ? ?  ?  ? ?  ?  ? ?  ? ? ?Plan Of Care/Follow-up recommendations:  ? ?Patient to be discharged to the care of her Legal Guardian. ? ?Activity: as tolerated ? ?Diet: heart healthy ? ?Other: ?-Follow-up with your outpatient psychiatric provider -instructions on appointment date, time, and address (location) are provided to you in discharge paperwork. ? ?-Take your psychiatric medications as prescribed at discharge - instructions are provided to you in the discharge paperwork ? ?-Follow-up with outpatient primary care doctor and other specialists for management of chronic medical disease, including: Hx of Stage IV melanoma recommend f/u with oncologist in 12/2021 GI recommended f/u  and see PCP for Urinary frequency and reported incontinence ? ?-Testing: Follow-up with outpatient provider for abnormal lab results: Low Platelets (chronic) ? ?-Recommend  abstinence from alcohol,  tobacco, and other illicit drug use at discharge.  ? ?-If your psychiatric symptoms recur, worsen, or if you have side effects to your psychiatric medications, call your outpatient psy

## 2021-10-12 NOTE — Group Note (Signed)
[facility] LCSW Group Therapy ? ? ?10/12/2021 1:00pm ? ? ? ?Type of Therapy and Topic:  Group Therapy:  Strengths Exploration ? ? ?Participation Level: Did Not Attend ? ?Description of Group: ?This group allows individuals to explore their strengths, learn to use strengths in new ways to improve well-being. Strengths-based interventions involve identifying strengths, understanding how they are used, and learning new ways to apply them. Individuals will identify their strengths, and then explore their roles in different areas of life (relationships, professional life, and personal fulfillment). Individuals will think about ways in which they currently use their strengths, along with new ways they could begin using them. ? ?  ?Therapeutic Goals ?Patient will verbalize two of their strengths ?Patient will identify how their strengths are currently used ?Patient will identify two new ways to apply their strengths  ?Patients will create a plan to apply their strengths in their daily lives  ?  ? ?Summary of Patient Progress:  Patient was invited to group however, declined to attend.  ? ?  ? ?Therapeutic Modalities ?Cognitive Behavioral Therapy ?Motivational Interviewing ? ? ?Darletta Moll MSW, LCSW ?Clincal Social Worker  ?[facility]  ?

## 2021-10-16 ENCOUNTER — Emergency Department (HOSPITAL_COMMUNITY): Payer: Medicare Other

## 2021-10-16 ENCOUNTER — Encounter (HOSPITAL_COMMUNITY): Payer: Self-pay | Admitting: Emergency Medicine

## 2021-10-16 ENCOUNTER — Emergency Department (HOSPITAL_COMMUNITY)
Admission: EM | Admit: 2021-10-16 | Discharge: 2021-10-17 | Disposition: A | Discharge status: Home / Self Care | Payer: Medicare Other | Attending: Emergency Medicine | Admitting: Emergency Medicine

## 2021-10-16 ENCOUNTER — Other Ambulatory Visit: Payer: Self-pay

## 2021-10-16 DIAGNOSIS — F29 Unspecified psychosis not due to a substance or known physiological condition: Secondary | ICD-10-CM | POA: Diagnosis not present

## 2021-10-16 DIAGNOSIS — R55 Syncope and collapse: Secondary | ICD-10-CM | POA: Insufficient documentation

## 2021-10-16 DIAGNOSIS — S0033XA Contusion of nose, initial encounter: Secondary | ICD-10-CM | POA: Diagnosis not present

## 2021-10-16 DIAGNOSIS — Y9259 Other trade areas as the place of occurrence of the external cause: Secondary | ICD-10-CM | POA: Insufficient documentation

## 2021-10-16 DIAGNOSIS — S0990XA Unspecified injury of head, initial encounter: Secondary | ICD-10-CM | POA: Insufficient documentation

## 2021-10-16 DIAGNOSIS — R58 Hemorrhage, not elsewhere classified: Secondary | ICD-10-CM | POA: Diagnosis not present

## 2021-10-16 DIAGNOSIS — S022XXB Fracture of nasal bones, initial encounter for open fracture: Secondary | ICD-10-CM | POA: Insufficient documentation

## 2021-10-16 DIAGNOSIS — Z23 Encounter for immunization: Secondary | ICD-10-CM | POA: Diagnosis not present

## 2021-10-16 DIAGNOSIS — W01198A Fall on same level from slipping, tripping and stumbling with subsequent striking against other object, initial encounter: Secondary | ICD-10-CM | POA: Diagnosis not present

## 2021-10-16 DIAGNOSIS — F4325 Adjustment disorder with mixed disturbance of emotions and conduct: Secondary | ICD-10-CM | POA: Diagnosis not present

## 2021-10-16 DIAGNOSIS — D696 Thrombocytopenia, unspecified: Secondary | ICD-10-CM | POA: Diagnosis not present

## 2021-10-16 DIAGNOSIS — Y9301 Activity, walking, marching and hiking: Secondary | ICD-10-CM | POA: Insufficient documentation

## 2021-10-16 DIAGNOSIS — Z20822 Contact with and (suspected) exposure to covid-19: Secondary | ICD-10-CM | POA: Insufficient documentation

## 2021-10-16 DIAGNOSIS — Z85820 Personal history of malignant melanoma of skin: Secondary | ICD-10-CM | POA: Diagnosis not present

## 2021-10-16 DIAGNOSIS — R609 Edema, unspecified: Secondary | ICD-10-CM | POA: Diagnosis not present

## 2021-10-16 DIAGNOSIS — F209 Schizophrenia, unspecified: Secondary | ICD-10-CM | POA: Insufficient documentation

## 2021-10-16 DIAGNOSIS — I10 Essential (primary) hypertension: Secondary | ICD-10-CM | POA: Diagnosis not present

## 2021-10-16 DIAGNOSIS — M79605 Pain in left leg: Secondary | ICD-10-CM | POA: Diagnosis not present

## 2021-10-16 DIAGNOSIS — Z85828 Personal history of other malignant neoplasm of skin: Secondary | ICD-10-CM | POA: Diagnosis not present

## 2021-10-16 DIAGNOSIS — M79604 Pain in right leg: Secondary | ICD-10-CM | POA: Insufficient documentation

## 2021-10-16 DIAGNOSIS — S0081XA Abrasion of other part of head, initial encounter: Secondary | ICD-10-CM | POA: Diagnosis not present

## 2021-10-16 DIAGNOSIS — Z79899 Other long term (current) drug therapy: Secondary | ICD-10-CM | POA: Diagnosis not present

## 2021-10-16 DIAGNOSIS — S0992XA Unspecified injury of nose, initial encounter: Secondary | ICD-10-CM | POA: Diagnosis present

## 2021-10-16 DIAGNOSIS — R000 Tachycardia, unspecified: Secondary | ICD-10-CM | POA: Diagnosis not present

## 2021-10-16 DIAGNOSIS — R791 Abnormal coagulation profile: Secondary | ICD-10-CM | POA: Diagnosis not present

## 2021-10-16 DIAGNOSIS — F99 Mental disorder, not otherwise specified: Secondary | ICD-10-CM

## 2021-10-16 DIAGNOSIS — W19XXXA Unspecified fall, initial encounter: Secondary | ICD-10-CM

## 2021-10-16 DIAGNOSIS — F203 Undifferentiated schizophrenia: Secondary | ICD-10-CM | POA: Diagnosis present

## 2021-10-16 LAB — COMPREHENSIVE METABOLIC PANEL
ALT: 20 U/L (ref 0–44)
AST: 32 U/L (ref 15–41)
Albumin: 4 g/dL (ref 3.5–5.0)
Alkaline Phosphatase: 76 U/L (ref 38–126)
Anion gap: 7 (ref 5–15)
BUN: 20 mg/dL (ref 6–20)
CO2: 25 mmol/L (ref 22–32)
Calcium: 8.9 mg/dL (ref 8.9–10.3)
Chloride: 104 mmol/L (ref 98–111)
Creatinine, Ser: 0.58 mg/dL (ref 0.44–1.00)
GFR, Estimated: >60 mL/min (ref >60)
Glucose, Bld: 125 mg/dL — ABNORMAL HIGH (ref 70–99)
Potassium: 3.4 mmol/L — ABNORMAL LOW (ref 3.5–5.1)
Sodium: 136 mmol/L (ref 135–145)
Total Bilirubin: 0.6 mg/dL (ref 0.3–1.2)
Total Protein: 7 g/dL (ref 6.5–8.1)

## 2021-10-16 LAB — TSH: TSH: 0.921 u[IU]/mL (ref 0.350–4.500)

## 2021-10-16 LAB — CBC WITH DIFFERENTIAL/PLATELET
Abs Immature Granulocytes: 0.02 10*3/uL (ref 0.00–0.07)
Basophils Absolute: 0 10*3/uL (ref 0.0–0.1)
Basophils Relative: 0 %
Eosinophils Absolute: 0 10*3/uL (ref 0.0–0.5)
Eosinophils Relative: 0 %
HCT: 34.9 % — ABNORMAL LOW (ref 36.0–46.0)
Hemoglobin: 11.4 g/dL — ABNORMAL LOW (ref 12.0–15.0)
Immature Granulocytes: 0 %
Lymphocytes Relative: 11 %
Lymphs Abs: 0.8 10*3/uL (ref 0.7–4.0)
MCH: 27.7 pg (ref 26.0–34.0)
MCHC: 32.7 g/dL (ref 30.0–36.0)
MCV: 84.7 fL (ref 80.0–100.0)
Monocytes Absolute: 0.6 10*3/uL (ref 0.1–1.0)
Monocytes Relative: 9 %
Neutro Abs: 5.6 10*3/uL (ref 1.7–7.7)
Neutrophils Relative %: 80 %
Platelets: 75 10*3/uL — ABNORMAL LOW (ref 150–400)
RBC: 4.12 MIL/uL (ref 3.87–5.11)
RDW: 12.3 % (ref 11.5–15.5)
WBC: 7.1 10*3/uL (ref 4.0–10.5)
nRBC: 0 % (ref 0.0–0.2)

## 2021-10-16 LAB — RESP PANEL BY RT-PCR (FLU A&B, COVID) ARPGX2
Influenza A by PCR: NEGATIVE
Influenza B by PCR: NEGATIVE
SARS Coronavirus 2 by RT PCR: NEGATIVE

## 2021-10-16 LAB — I-STAT BETA HCG BLOOD, ED (MC, WL, AP ONLY): I-stat hCG, quantitative: <5 m[IU]/mL (ref <5)

## 2021-10-16 LAB — SALICYLATE LEVEL: Salicylate Lvl: <7 mg/dL — ABNORMAL LOW (ref 7.0–30.0)

## 2021-10-16 LAB — ETHANOL: Alcohol, Ethyl (B): <10 mg/dL (ref <10)

## 2021-10-16 LAB — ACETAMINOPHEN LEVEL: Acetaminophen (Tylenol), Serum: <10 ug/mL — ABNORMAL LOW (ref 10–30)

## 2021-10-16 MED ORDER — IBUPROFEN 800 MG PO TABS
800.0000 mg | ORAL_TABLET | Freq: Once | ORAL | Status: AC
  Administered 2021-10-16: 800 mg via ORAL
  Filled 2021-10-16: qty 1

## 2021-10-16 MED ORDER — DOCUSATE SODIUM 100 MG PO CAPS: 100.0000 mg | ORAL_CAPSULE | Freq: Every day | ORAL | Status: DC | PRN

## 2021-10-16 MED ORDER — OLANZAPINE 10 MG PO TABS
10.0000 mg | ORAL_TABLET | Freq: Once | ORAL | Status: AC
  Administered 2021-10-16: 10 mg via ORAL
  Filled 2021-10-16: qty 1

## 2021-10-16 MED ORDER — TETANUS-DIPHTH-ACELL PERTUSSIS 5-2.5-18.5 LF-MCG/0.5 IM SUSY
0.5000 mL | PREFILLED_SYRINGE | Freq: Once | INTRAMUSCULAR | Status: AC
  Administered 2021-10-16: 0.5 mL via INTRAMUSCULAR
  Filled 2021-10-16: qty 0.5

## 2021-10-16 MED ORDER — LORATADINE 10 MG PO TABS
10.0000 mg | ORAL_TABLET | Freq: Every day | ORAL | Status: DC
  Administered 2021-10-17: 10 mg via ORAL
  Filled 2021-10-16: qty 1

## 2021-10-16 MED ORDER — ACETAMINOPHEN 325 MG PO TABS
650.0000 mg | ORAL_TABLET | Freq: Once | ORAL | Status: AC
Start: 2021-10-16 — End: 2021-10-16
  Administered 2021-10-16: 650 mg via ORAL
  Filled 2021-10-16: qty 2

## 2021-10-16 MED ORDER — OLANZAPINE 10 MG PO TABS
10.0000 mg | ORAL_TABLET | Freq: Two times a day (BID) | ORAL | Status: DC
  Administered 2021-10-17: 10 mg via ORAL
  Filled 2021-10-16: qty 1

## 2021-10-16 MED ORDER — CEPHALEXIN 500 MG PO CAPS
500.0000 mg | ORAL_CAPSULE | Freq: Two times a day (BID) | ORAL | Status: DC
  Administered 2021-10-17 (×2): 500 mg via ORAL
  Filled 2021-10-16 (×2): qty 1

## 2021-10-16 NOTE — Discharge Instructions (Addendum)
It was a pleasure caring for you today in the emergency department. ° °Please return to the emergency department for any worsening or worrisome symptoms. ° ° °

## 2021-10-16 NOTE — ED Provider Notes (Signed)
?[facility] DEPT ?Provider Note ? ? ?CSN: [id number] ?Arrival date & time: 10/16/21  1354 ? ?  ? ?History ? ?Chief Complaint  ?Patient presents with  ? Fall  ? Laceration  ?  face  ? Altered Mental Status  ? ? ?Kaitlyn Duncan is a 51 y.o. female. ? ? 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  ? ?Patient reports she was walking outside of her residence, she tripped on the curb and fell forward.  Struck her face on the ground.  No LOC.  Experienced immediate discomfort to her nose.  Nosebleed.  No difficulty breathing.  No blood thinners.  She also has some pain to her left ankle, thinks she may have twisted her ankle with the fall.  No abdominal pain, nausea or vomiting, no chest pain, she has mild dyspnea secondary to dried blood to her nose.  No stridor, drooling or trismus.  No jaw pain.  She is swallowing appropriately.  Feels like she is speaking at her typical level.  She feels as though it was a mechanical fall as she tripped over an object on the ground.  No LOC.  She is unsure of last tetanus shot. Mild HA, face pain. Mild neck pain. ? ? ?Other than the fall, no other acute medical complaints. ?Past Medical History: ?No date: Allergy ?No date: Anemia ?No date: Gallstones ?No date: GERD (gastroesophageal reflux disease) ?No date: Hemorrhoids ?dx'd 01/2018: melanoma with met dz ?    Comment:  brain and adrenal ?No date: Seasonal allergies ? ?Past Surgical History: ?01/28/2018: BIOPSY ?    Comment:  Procedure: BIOPSY;  Surgeon: Laurence Spates, MD;   ?             Location: [facility];  Service: Endoscopy;; ?No date: BREAST BIOPSY; Left ?01/28/2018: ESOPHAGOGASTRODUODENOSCOPY; N/A ?    Comment:  Procedure: ESOPHAGOGASTRODUODENOSCOPY (EGD);  Surgeon:  ?             Laurence Spates, MD;  Location: [facility];  Service:  ?             Endoscopy;   Laterality: N/A; ?No date: MOUTH SURGERY ?No date: OVARY SURGERY ?    Comment:  removed "something"  ? ? ?The history is provided by the patient. No language interpreter was used.  ?Fall ?Associated symptoms include headaches. Pertinent negatives include no chest pain and no abdominal pain.  ?Laceration ?Associated symptoms: no fever and no rash   ?Altered Mental Status ?Presenting symptoms: no confusion   ?Associated symptoms: headaches   ?Associated symptoms: no abdominal pain, no agitation, no fever, no nausea, no palpitations, no rash and no vomiting   ? ?  ? ?Home Medications ?Prior to Admission medications   ?Medication Sig Start Date End Date Taking? Authorizing Provider  ?acetaminophen (TYLENOL) 500 MG tablet Take 500-1,000 mg by mouth every 6 (six) hours as needed for mild pain or headache.   Yes [provider]  ?docusate sodium (COLACE) 100 MG capsule Take 100-200 mg by mouth daily as needed for mild constipation.   Yes [provider]  ?fluPHENAZine decanoate (PROLIXIN) 25 MG/ML injection Inject 1 mL (25 mg total) into the muscle every 14 (fourteen) days. Administer every 14 days after 10-08-21. 10/08/21  Yes Massengill, Ovid Curd, MD  ?loratadine (CLARITIN) 10 MG tablet Take 1 tablet (10 mg total) by mouth daily. ?Patient taking differently:  Take 10 mg by mouth daily as needed (for seasonal allergies). 10/13/21  Yes Massengill, Ovid Curd, MD  ?simethicone (MYLICON) 496 MG chewable tablet Chew 125 mg by mouth every 6 (six) hours as needed for flatulence.   Yes [provider]  ?cetaphil (CETAPHIL) lotion Apply topically daily. ?Patient not taking: Reported on 10/16/2021 10/13/21   Janine Limbo, MD  ?fluPHENAZine (PROLIXIN) 2.5 MG tablet Take 3 tablets (7.5 mg total) by mouth 2 (two) times daily for 3 days. ?Patient not taking: Reported on 10/16/2021 10/12/21 10/16/21  Janine Limbo, MD  ?OLANZapine (ZYPREXA) 10 MG tablet Take 1 tablet (10 mg total) by mouth 2 (two) times  daily. ?Patient not taking: Reported on 10/16/2021 10/12/21 11/11/21  Janine Limbo, MD  ?   ? ?Allergies    ?Patient has no known allergies.   ? ?Review of Systems   ?Review of Systems  ?Constitutional:  Negative for chills and fever.  ?HENT:  Negative for facial swelling and trouble swallowing.   ?Eyes:  Negative for photophobia and visual disturbance.  ?Respiratory:  Negative for cough, choking and stridor.   ?Cardiovascular:  Negative for chest pain and palpitations.  ?Gastrointestinal:  Negative for abdominal pain, nausea and vomiting.  ?Endocrine: Negative for polydipsia and polyuria.  ?Genitourinary:  Negative for difficulty urinating and hematuria.  ?Musculoskeletal:  Positive for neck pain. Negative for gait problem and joint swelling.  ?Skin:  Positive for wound. Negative for pallor and rash.  ?Neurological:  Positive for headaches. Negative for syncope.  ?Psychiatric/Behavioral:  Negative for agitation and confusion.   ? ?Physical Exam ?Updated Vital Signs ?BP 114/80   Pulse 80   Temp 98 ?F (36.7 ?C) (Oral)   Resp 11   SpO2 98%  ?Physical Exam ?Vitals and nursing note reviewed.  ?Constitutional:   ?   General: She is not in acute distress. ?   Appearance: Normal appearance.  ?HENT:  ?   Head: Normocephalic. No raccoon eyes, Battle's sign, right periorbital erythema or left periorbital erythema.  ?   Jaw: There is normal jaw occlusion. No trismus.  ? ?   Comments: Laceration to bridge of nose, dried blood to bilateral naris, no obvious septal hematoma ?   Right Ear: External ear normal.  ?   Left Ear: External ear normal.  ?   Nose: Nose normal.  ?   Mouth/Throat:  ?   Mouth: Mucous membranes are moist.  ?Eyes:  ?   General: No scleral icterus.    ?   Right eye: No discharge.     ?   Left eye: No discharge.  ?   Extraocular Movements: Extraocular movements intact.  ?   Pupils: Pupils are equal, round, and reactive to light.  ?Neck:  ?   Comments: C-collar ?Cardiovascular:  ?   Rate and Rhythm: Normal  rate and regular rhythm.  ?   Pulses: Normal pulses.  ?   Heart sounds: Normal heart sounds.  ?Pulmonary:  ?   Effort: Pulmonary effort is normal. No respiratory distress.  ?   Breath sounds: Normal breath sounds.  ?Abdominal:  ?   General: Abdomen is flat.  ?   Palpations: Abdomen is soft.  ?   Tenderness: There is no abdominal tenderness.  ?Musculoskeletal:     ?   General: Normal range of motion.  ?   Cervical back: Normal range of motion.  ?   Right lower leg: No edema.  ?   Left lower leg: No edema.  ?  Comments: No midline spinous process tenderness to palpation or percussion, no crepitus or step-off to T and L spine. Mild midline pain to palpation to c3-c4. C collar in place ? ?Pelvis stable to ap pressure ?  ?Skin: ?   General: Skin is warm and dry.  ?   Capillary Refill: Capillary refill takes less than 2 seconds.  ?Neurological:  ?   Mental Status: She is alert and oriented to person, place, and time.  ?   GCS: GCS eye subscore is 4. GCS verbal subscore is 5. GCS motor subscore is 6.  ?   Cranial Nerves: Cranial nerves 2-12 are intact.  ?   Sensory: Sensation is intact.  ?   Motor: Motor function is intact.  ?   Coordination: Coordination is intact.  ?   Comments: Gait not tested 2/2 pt safety   ?Psychiatric:     ?   Mood and Affect: Mood normal.     ?   Behavior: Behavior normal.  ? ? ?ED Results / Procedures / Treatments   ?Labs ?(all labs ordered are listed, but only abnormal results are displayed) ?Labs Reviewed  ?COMPREHENSIVE METABOLIC PANEL - Abnormal; Notable for the following components:  ?    Result Value  ? Potassium 3.4 (*)   ? Glucose, Bld 125 (*)   ? All other components within normal limits  ?CBC WITH DIFFERENTIAL/PLATELET - Abnormal; Notable for the following components:  ? Hemoglobin 11.4 (*)   ? HCT 34.9 (*)   ? Platelets 75 (*)   ? All other components within normal limits  ?SALICYLATE LEVEL - Abnormal; Notable for the following components:  ? Salicylate Lvl <0.7 (*)   ? All other  components within normal limits  ?ACETAMINOPHEN LEVEL - Abnormal; Notable for the following components:  ? Acetaminophen (Tylenol), Serum <10 (*)   ? All other components within normal limits  ?RESP PANEL BY RT-PCR (F

## 2021-10-16 NOTE — BH Assessment (Signed)
Attempted tele-assessment. Pt appears somnolent and was unable to participate in assessment. Assessment will be completed when Pt is able to respond to questions. ? ? ?Orpah Greek Anson Fret, [facility], [place] ?Triage Specialist ?[phone] ? ?

## 2021-10-16 NOTE — ED Triage Notes (Signed)
Per EMS, patient from hotel, trip and fall walking, hitting face to curb. Laceration to nose. Teeth intact. Bruising and swelling to forehead. Staff at hotel reports altered at baseline. Registration contacting legal guardian at this time. ?

## 2021-10-16 NOTE — ED Notes (Signed)
Pt confused. Has purewick in, but keeps saying they don't. Pt encouraged to use purewick. ?

## 2021-10-16 NOTE — BHH Counselor (Signed)
TTS informed provider and patient's nurse at 5:45pm that TTS could see the patient. The machine has not been placed in the patient's room. TTS will see patient later this date.  ?

## 2021-10-17 ENCOUNTER — Emergency Department (HOSPITAL_COMMUNITY): Payer: Medicare Other

## 2021-10-17 ENCOUNTER — Encounter (HOSPITAL_COMMUNITY): Payer: Self-pay | Admitting: Emergency Medicine

## 2021-10-17 ENCOUNTER — Other Ambulatory Visit: Payer: Self-pay

## 2021-10-17 ENCOUNTER — Emergency Department (HOSPITAL_COMMUNITY)
Admission: EM | Admit: 2021-10-17 | Discharge: 2021-10-19 | Disposition: A | Discharge status: Home / Self Care | Payer: Medicare Other | Source: Home / Self Care | Attending: Emergency Medicine | Admitting: Emergency Medicine

## 2021-10-17 DIAGNOSIS — Z85820 Personal history of malignant melanoma of skin: Secondary | ICD-10-CM | POA: Insufficient documentation

## 2021-10-17 DIAGNOSIS — M79604 Pain in right leg: Secondary | ICD-10-CM | POA: Insufficient documentation

## 2021-10-17 DIAGNOSIS — M79605 Pain in left leg: Secondary | ICD-10-CM | POA: Insufficient documentation

## 2021-10-17 DIAGNOSIS — S0081XA Abrasion of other part of head, initial encounter: Secondary | ICD-10-CM | POA: Insufficient documentation

## 2021-10-17 DIAGNOSIS — Z20822 Contact with and (suspected) exposure to covid-19: Secondary | ICD-10-CM | POA: Insufficient documentation

## 2021-10-17 DIAGNOSIS — R55 Syncope and collapse: Secondary | ICD-10-CM | POA: Insufficient documentation

## 2021-10-17 DIAGNOSIS — R791 Abnormal coagulation profile: Secondary | ICD-10-CM | POA: Insufficient documentation

## 2021-10-17 DIAGNOSIS — D696 Thrombocytopenia, unspecified: Secondary | ICD-10-CM | POA: Insufficient documentation

## 2021-10-17 DIAGNOSIS — W1839XA Other fall on same level, initial encounter: Secondary | ICD-10-CM | POA: Insufficient documentation

## 2021-10-17 DIAGNOSIS — S0033XA Contusion of nose, initial encounter: Secondary | ICD-10-CM | POA: Insufficient documentation

## 2021-10-17 DIAGNOSIS — S022XXB Fracture of nasal bones, initial encounter for open fracture: Secondary | ICD-10-CM | POA: Diagnosis not present

## 2021-10-17 DIAGNOSIS — F4325 Adjustment disorder with mixed disturbance of emotions and conduct: Secondary | ICD-10-CM

## 2021-10-17 LAB — URINALYSIS, ROUTINE W REFLEX MICROSCOPIC
Bilirubin Urine: NEGATIVE
Glucose, UA: NEGATIVE mg/dL
Hgb urine dipstick: NEGATIVE
Ketones, ur: NEGATIVE mg/dL
Leukocytes,Ua: NEGATIVE
Nitrite: NEGATIVE
Protein, ur: NEGATIVE mg/dL
Specific Gravity, Urine: 1.013 (ref 1.005–1.030)
pH: 7 (ref 5.0–8.0)

## 2021-10-17 LAB — CBG MONITORING, ED: Glucose-Capillary: 156 mg/dL — ABNORMAL HIGH (ref 70–99)

## 2021-10-17 LAB — RAPID URINE DRUG SCREEN, HOSP PERFORMED
Amphetamines: NOT DETECTED
Barbiturates: NOT DETECTED
Benzodiazepines: NOT DETECTED
Cocaine: NOT DETECTED
Opiates: NOT DETECTED
Tetrahydrocannabinol: NOT DETECTED

## 2021-10-17 NOTE — ED Notes (Signed)
APS complaint filed. This RN spoke with patient's legal guardian [name] [phone]) who just picked her up when she was discharged around 8p. I asked her what happened, as patient was found at a gas station. She states that there is nothing she can do and that the patient got out of the car. States that she "knew this would happen and she is not dealing with this." I questioned the role of a legal guardian and she became upset.  ? ?To file the APS complaint, I spoke with social worker Jimmye Norman, who stated that the concerns would be forwarded to the supervisor. We are concerned for this patient's safety especially with her brain cancer and psychiatric problems. I suggested that they look in to her case further for potential long term care. Additionally, her legal guardian, in her words, "does not want to deal with this right now."  ? ?Patient is safe and stable at this time.  ?

## 2021-10-17 NOTE — Progress Notes (Addendum)
CSW spoke with [facility] after hours line [phone], received call back from social worker Jimmye Norman, informed her that pt has been cleared for DC, requested pick up due to pt having legal guardian. ? ?CSW received call shortly after form pt legal guardian, Kaitlyn Duncan, informed her that pt ready for DC and needs to be picked up.  Ms Kaitlyn Duncan asked that pt be discharged back to her hotel.  CSW informed her that we cannot discharge a pt with a legal guardian, we need them to be picked up by the guardian.  Ms. Kaitlyn Duncan stated, "I have been dealing with her all week, she is not going to cooperate, and I am not going to pick her up today."  CSW inquired as to the on call social worker picking her up and Ms Kaitlyn Duncan said that was not an option either.   ? ?CSW again called the after hours [facility] and requested to speak with a DSS supervisor. ?Lurline Idol, MSW, LCSW ?4/15/20233:17 PM  ? ?1600: CSW attempted to call after hours line again, CSW was told that supervisor would be calling back shortly.  ?Lurline Idol, MSW, LCSW ?4/15/20234:03 PM  ?

## 2021-10-17 NOTE — ED Triage Notes (Signed)
Pt in by [facility], picked up at [facility] for "increased facial pain" related to a fall that occurred yesterday. Pt was discharged just a few hours prior to this call and left with her legal guardian. En route to ED, pt had syncopal event and pressures were 80/40's. Given 257ms NS, VSS on arrival. CBG 156. Lac present to nose, old from yesterday. Pt c/o pain to bilateral legs ?

## 2021-10-17 NOTE — ED Notes (Signed)
Patient to room 31.  Oriented to unit and room .  Patient resting in bed.  ?

## 2021-10-17 NOTE — ED Provider Notes (Signed)
?[facility] ?Provider Note ? ? ?CSN: [id number] ?Arrival date & time: 10/17/21  2243 ? ?  ? ?History ? ?Chief Complaint  ?Patient presents with  ? Loss of Consciousness  ? ? ?Kaitlyn Duncan is a 51 y.o. female. ? ?The history is provided by the patient.  ?Loss of Consciousness ?Kaitlyn Duncan is a 51 y.o. female who presents to the Emergency Department complaining of syncope.  She presents to the emergency department by EMS after being picked up from sheets.  She was discharged from the hospital at 8:30 PM in the care of her DSS worker.  She was found about 10:30 PM.  She was complaining at the time of EMS arrival of facial pain and had a near syncopal event with pressures of 80s over 40s for EMS.  She complains of pain to bilateral upper legs, no other acute complaints.  No chest pain, difficulty breathing, abdominal pain, nausea, vomiting, dysuria, incontinence.  She is currently living at in town suites and states that her card key is not working and will not be able to get into her room again until Monday.  She states she wants to stay in the emergency department until that time. ?  ? ?Home Medications ?Prior to Admission medications   ?Medication Sig Start Date End Date Taking? Authorizing Provider  ?acetaminophen (TYLENOL) 500 MG tablet Take 500-1,000 mg by mouth every 6 (six) hours as needed for mild pain or headache.    [provider]  ?cetaphil (CETAPHIL) lotion Apply topically daily. ?Patient not taking: Reported on 10/16/2021 10/13/21   Janine Limbo, MD  ?docusate sodium (COLACE) 100 MG capsule Take 100-200 mg by mouth daily as needed for mild constipation.    [provider]  ?fluPHENAZine (PROLIXIN) 2.5 MG tablet Take 3 tablets (7.5 mg total) by mouth 2 (two) times daily for 3 days. ?Patient not taking: Reported on 10/16/2021 10/12/21 10/16/21  Janine Limbo, MD  ?fluPHENAZine decanoate (PROLIXIN) 25 MG/ML injection Inject 1 mL (25  mg total) into the muscle every 14 (fourteen) days. Administer every 14 days after 10-08-21. 10/08/21   Janine Limbo, MD  ?loratadine (CLARITIN) 10 MG tablet Take 1 tablet (10 mg total) by mouth daily. ?Patient taking differently: Take 10 mg by mouth daily as needed (for seasonal allergies). 10/13/21   Massengill, Ovid Curd, MD  ?OLANZapine (ZYPREXA) 10 MG tablet Take 1 tablet (10 mg total) by mouth 2 (two) times daily. ?Patient not taking: Reported on 10/16/2021 10/12/21 11/11/21  Janine Limbo, MD  ?simethicone (MYLICON) 833 MG chewable tablet Chew 125 mg by mouth every 6 (six) hours as needed for flatulence.    [provider]  ?   ? ?Allergies    ?Patient has no known allergies.   ? ?Review of Systems   ?Review of Systems  ?Cardiovascular:  Positive for syncope.  ?All other systems reviewed and are negative. ? ?Physical Exam ?Updated Vital Signs ?BP (!) 147/83 (BP Location: Right Arm)   Pulse 71   Temp 99 ?F (37.2 ?C) (Oral)   Resp 11   Wt 59.4 kg   SpO2 100%   BMI 24.74 kg/m?  ?Physical Exam ?Vitals and nursing note reviewed.  ?Constitutional:   ?   Appearance: She is well-developed.  ?HENT:  ?   Head: Normocephalic.  ?   Comments: Midface abrasions that are healing.  There is ecchymosis to the nose. ?Cardiovascular:  ?   Rate and Rhythm: Normal rate and regular rhythm.  ?  Heart sounds: No murmur heard. ?Pulmonary:  ?   Effort: Pulmonary effort is normal. No respiratory distress.  ?   Breath sounds: Normal breath sounds.  ?Abdominal:  ?   Palpations: Abdomen is soft.  ?   Tenderness: There is no abdominal tenderness. There is no guarding or rebound.  ?Musculoskeletal:     ?   General: No swelling or tenderness.  ?   Comments: 2+ DP pulses bilaterally.  No tenderness to palpation over the pelvis, legs bilaterally  ?Skin: ?   General: Skin is warm and dry.  ?Neurological:  ?   Mental Status: She is alert and oriented to person, place, and time.  ?   Comments: 5 out of 5 strength in all 4  extremities.  Steady gait.  ?Psychiatric:  ?   Comments: Flat affect.  No SI, HI.  ? ? ?ED Results / Procedures / Treatments   ?Labs ?(all labs ordered are listed, but only abnormal results are displayed) ?Labs Reviewed  ?COMPREHENSIVE METABOLIC PANEL - Abnormal; Notable for the following components:  ?    Result Value  ? Glucose, Bld 127 (*)   ? All other components within normal limits  ?CBC WITH DIFFERENTIAL/PLATELET - Abnormal; Notable for the following components:  ? Platelets 65 (*)   ? Lymphs Abs 0.6 (*)   ? All other components within normal limits  ?D-DIMER, QUANTITATIVE - Abnormal; Notable for the following components:  ? D-Dimer, Quant 1.00 (*)   ? All other components within normal limits  ?CBG MONITORING, ED - Abnormal; Notable for the following components:  ? Glucose-Capillary 156 (*)   ? All other components within normal limits  ?URINALYSIS, ROUTINE W REFLEX MICROSCOPIC  ?TROPONIN I (HIGH SENSITIVITY)  ?TROPONIN I (HIGH SENSITIVITY)  ? ? ?EKG ?None ? ?Radiology ?DG Pelvis 1-2 Views ? ?Result Date: 10/17/2021 ?CLINICAL DATA:  Fall EXAM: PELVIS - 1-2 VIEW COMPARISON:  None. FINDINGS: There is no evidence of pelvic fracture or diastasis. Sclerotic lesion within the right sacral ala is most in keeping with a a bone island. Soft tissues are unremarkable. IMPRESSION: No acute fracture or dislocation Electronically Signed   By: Fidela Salisbury M.D.   On: 10/17/2021 23:59  ? ?DG Ankle Complete Left ? ?Result Date: 10/16/2021 ?CLINICAL DATA:  Fall EXAM: LEFT ANKLE COMPLETE - 3 VIEW COMPARISON:  Left ankle radiograph dated June 24, 2021 FINDINGS: There is no evidence of fracture, dislocation, or joint effusion. There is no evidence of arthropathy or other focal bone abnormality. Soft tissues are unremarkable. IMPRESSION: No acute osseous abnormality. Electronically Signed   By: Yetta Glassman M.D.   On: 10/16/2021 16:50  ? ?CT Head Wo Contrast ? ?Result Date: 10/18/2021 ?CLINICAL DATA:  Head trauma, abnormal  mental status (Age 63-64y) EXAM: CT HEAD WITHOUT CONTRAST TECHNIQUE: Contiguous axial images were obtained from the base of the skull through the vertex without intravenous contrast. RADIATION DOSE REDUCTION: This exam was performed according to the departmental dose-optimization program which includes automated exposure control, adjustment of the mA and/or kV according to patient size and/or use of iterative reconstruction technique. COMPARISON:  10/16/2021 FINDINGS: Brain: Normal anatomic configuration. Parenchymal volume loss is stable since prior examination, though slightly greater than would be typically expected given the patient's age patient's age. Stable moderate periventricular white matter changes are present likely reflecting the sequela of small vessel ischemia. No abnormal intra or extra-axial mass lesion or fluid collection. No abnormal mass effect or midline shift. No evidence of acute intracranial  hemorrhage or infarct. Ventricular size is normal. Cerebellum unremarkable. Vascular: No asymmetric hyperdense vasculature at the skull base. Skull: Intact Sinuses/Orbits: Left maxillary antrostomy has been performed. Moderate mucosal thickening is seen within the left maxillary sinus. Remaining paranasal sinuses are clear. Orbits are unremarkable. Other: Mastoid air cells and middle ear cavities are clear. IMPRESSION: No acute intracranial abnormality. Parenchymal atrophy, stable since prior examination, advanced for age. Moderate periventricular white matter changes, relatively advanced given the patient's age, possibly the sequela of small vessel ischemia. Left maxillary sinus disease. Electronically Signed   By: Fidela Salisbury M.D.   On: 10/18/2021 00:12  ? ?CT Head Wo Contrast ? ?Result Date: 10/16/2021 ?CLINICAL DATA:  Trauma, fall. EXAM: CT HEAD WITHOUT CONTRAST CT MAXILLOFACIAL WITHOUT CONTRAST CT CERVICAL SPINE WITHOUT CONTRAST TECHNIQUE: Multidetector CT imaging of the head, cervical spine, and  maxillofacial structures were performed using the standard protocol without intravenous contrast. Multiplanar CT image reconstructions of the cervical spine and maxillofacial structures were also generated. RADIAT

## 2021-10-17 NOTE — ED Notes (Signed)
Patient provided with all personal belongings.  On-call social worker currently here to take patient back to hotel.  Discharge paperwork provided to social worker ?

## 2021-10-17 NOTE — ED Provider Notes (Signed)
Emergency Medicine Observation Re-evaluation Note ? ?Kaitlyn Duncan is a 51 y.o. female, seen on rounds today at 0700.  Pt initially presented to the ED for complaints of Fall, Laceration (face), and Altered Mental Status ?Currently, the patient is resting comfortably. ? ?Physical Exam  ?BP 114/80   Pulse 80   Temp 98 ?F (36.7 ?C) (Oral)   Resp 11   SpO2 98%  ?Physical Exam ?General: NAD ? ? ?ED Course / MDM  ?EKG:EKG Interpretation ? ?Date/Time:  Friday October 16 2021 16:01:06 EDT ?Ventricular Rate:  117 ?PR Interval:  144 ?QRS Duration: 95 ?QT Interval:  325 ?QTC Calculation: 924 ?R Axis:   98 ?Text Interpretation: Sinus tachycardia Borderline right axis deviation Borderline T abnormalities, inferior leads morphology  similar to prior tracing [place] prior no stemi Confirmed by Wynona Dove ([id number]) on 10/16/2021 4:18:24 PM ? ?I have reviewed the labs performed to date as well as medications administered while in observation.  Recent changes in the last 24 hours include no acute events reported. ? ?Plan  ?Current plan is for psych/TOC evaluations. ? Kaitlyn Duncan is not under involuntary commitment. ? ? ?  ?Valarie Merino, MD ?10/17/21 [phone] ? ?

## 2021-10-17 NOTE — Consult Note (Addendum)
Telepsych Consultation  ? ?Reason for Consult:  Decompensated Schizophrenia ?Referring Physician:  Wynona Dove, DO ?Location of Patient:  [facility] ?Location of Provider: [facility] Department ? ?Patient Identification: Kaitlyn Duncan ?MRN:  [id number] ?Principal Diagnosis: Adjustment disorder with mixed disturbance of emotions and conduct ?Diagnosis:  Principal Problem: ?  Adjustment disorder with mixed disturbance of emotions and conduct ?Active Problems: ?  Schizophrenia ([place]) ? ? ?Total Time spent with patient: 20 minutes ? ?Subjective:   ?Kaitlyn Duncan is a 51 y.o. female patient admitted with fall. ? ?On assessment patient presents flat, blunt affect. Trauma to face, nose red, dried blood. States she is currently living in a hotel. Looking away from monitor. Slow to respond. States she "just got back" when asked. "I don't need any medication, do you need any medication?" Provider attempted to explain she was recently hospitalized; patient interrupted and stated "I didn't need any medication then and I don't need any medication now". Unable to state today's date, place "[facility], [place], [place], [place], Saturday, 2023". Patient refuses to answer safety questions stating they are "dumb questions". Denies any auditory or visual hallucinations. States plan to return to hotel. Denies any safety concerns at this time, requesting discharge. TOC consult placed to address psychosocial needs.  ? ?Collateral: [name] (legal guardian) [phone] ?*no answer; states voicemail has not been set up ? ?HPI:  Kaitlyn Duncan is a 51 year old female with past history of psychosis, schizophrenia spectrum disorder with psychotic disorder type not yet determined, schizophrenia, brain metastasis who presented to [facility] s/p fall at hotel where she has been living. Patient recently discharged from [facility] 10/12/21. Uncooperative. Legal guardian via APS. UDS-, BAL<10. ? ?Past Psychiatric  History: psychosis, schizophrenia spectrum disorder with psychotic disorder type not yet determined, schizophrenia, brain metastasis ? ?Risk to Self:  pt denies ?Risk to Others:  pt denies ?Prior Inpatient Therapy:  yes ?Prior Outpatient Therapy:  yes ? ?Past Medical History:  ?Past Medical History:  ?Diagnosis Date  ? Allergy   ? Anemia   ? Gallstones   ? GERD (gastroesophageal reflux disease)   ? Hemorrhoids   ? melanoma with met dz dx'd 01/2018  ? brain and adrenal  ? Seasonal allergies   ?  ?Past Surgical History:  ?Procedure Laterality Date  ? BIOPSY  01/28/2018  ? Procedure: BIOPSY;  Surgeon: Laurence Spates, MD;  Location: [facility];  Service: Endoscopy;;  ? BREAST BIOPSY Left   ? ESOPHAGOGASTRODUODENOSCOPY N/A 01/28/2018  ? Procedure: ESOPHAGOGASTRODUODENOSCOPY (EGD);  Surgeon: Laurence Spates, MD;  Location: [facility];  Service: Endoscopy;  Laterality: N/A;  ? MOUTH SURGERY    ? OVARY SURGERY    ? removed "something"  ? ?Family History:  ?Family History  ?Problem Relation Age of Onset  ? Melanoma Mother   ? Hypertension Father   ? Breast cancer Maternal Aunt   ? Breast cancer Paternal Aunt   ? Diabetes Paternal Aunt   ? Breast cancer Maternal Grandmother   ? Breast cancer Paternal Grandmother   ? Diabetes Paternal Grandmother   ? Colon cancer Neg Hx   ? Esophageal cancer Neg Hx   ? Pancreatic cancer Neg Hx   ? Stomach cancer Neg Hx   ? Liver disease Neg Hx   ? Rectal cancer Neg Hx   ? ?Family Psychiatric  History: not noted ?Social History:  ?Social History  ? ?Substance and Sexual Activity  ?Alcohol Use No  ?   ?Social History  ? ?  Substance and Sexual Activity  ?Drug Use No  ?  ?Social History  ? ?Socioeconomic History  ? Marital status: Single  ?  Spouse name: Not on file  ? Number of children: Not on file  ? Years of education: Not on file  ? Highest education level: Not on file  ?Occupational History  ? Not on file  ?Tobacco Use  ? Smoking status: Never  ? Smokeless tobacco: Never  ?Vaping Use  ?  Vaping Use: Never used  ?Substance and Sexual Activity  ? Alcohol use: No  ? Drug use: No  ? Sexual activity: Not Currently  ?Other Topics Concern  ? Not on file  ?Social History Narrative  ? Not on file  ? ?Social Determinants of Health  ? ?Financial Resource Strain: Not on file  ?Food Insecurity: Not on file  ?Transportation Needs: Not on file  ?Physical Activity: Not on file  ?Stress: Not on file  ?Social Connections: Not on file  ? ?Additional Social History: ?  ? ?Allergies:  No Known Allergies ? ?Labs:  ?Results for orders placed or performed during the hospital encounter of 10/16/21 (from the past 48 hour(s))  ?Comprehensive metabolic panel     Status: Abnormal  ? Collection Time: 10/16/21  4:52 PM  ?Result Value Ref Range  ? Sodium 136 135 - 145 mmol/L  ? Potassium 3.4 (L) 3.5 - 5.1 mmol/L  ? Chloride 104 98 - 111 mmol/L  ? CO2 25 22 - 32 mmol/L  ? Glucose, Bld 125 (H) 70 - 99 mg/dL  ?  Comment: Glucose reference range applies only to samples taken after fasting for at least 8 hours.  ? BUN 20 6 - 20 mg/dL  ? Creatinine, Ser 0.58 0.44 - 1.00 mg/dL  ? Calcium 8.9 8.9 - 10.3 mg/dL  ? Total Protein 7.0 6.5 - 8.1 g/dL  ? Albumin 4.0 3.5 - 5.0 g/dL  ? AST 32 15 - 41 U/L  ? ALT 20 0 - 44 U/L  ? Alkaline Phosphatase 76 38 - 126 U/L  ? Total Bilirubin 0.6 0.3 - 1.2 mg/dL  ? GFR, Estimated >60 >60 mL/min  ?  Comment: (NOTE) ?Calculated using the CKD-EPI Creatinine Equation (2021) ?  ? Anion gap 7 5 - 15  ?  Comment: Performed at [facility], [place] [address] [place], [place] [address]  ?Ethanol     Status: None  ? Collection Time: 10/16/21  4:52 PM  ?Result Value Ref Range  ? Alcohol, Ethyl (B) <10 <10 mg/dL  ?  Comment: (NOTE) ?Lowest detectable limit for serum alcohol is 10 mg/dL. ? ?For medical purposes only. ?Performed at [facility], [place] [address] ?[place], [place] [address] ?  ?CBC with Diff     Status: Abnormal  ? Collection Time: 10/16/21  4:52 PM  ?Result Value Ref  Range  ? WBC 7.1 4.0 - 10.5 K/uL  ? RBC 4.12 3.87 - 5.11 MIL/uL  ? Hemoglobin 11.4 (L) 12.0 - 15.0 g/dL  ? HCT 34.9 (L) 36.0 - 46.0 %  ? MCV 84.7 80.0 - 100.0 fL  ? MCH 27.7 26.0 - 34.0 pg  ? MCHC 32.7 30.0 - 36.0 g/dL  ? RDW 12.3 11.5 - 15.5 %  ? Platelets 75 (L) 150 - 400 K/uL  ?  Comment: SPECIMEN CHECKED FOR CLOTS ?Immature Platelet Fraction may be ?clinically indicated, consider ?ordering this additional test ?[id number] ?REPEATED TO VERIFY ?PLATELET COUNT CONFIRMED BY SMEAR ?  ? nRBC  0.0 0.0 - 0.2 %  ? Neutrophils Relative % 80 %  ? Neutro Abs 5.6 1.7 - 7.7 K/uL  ? Lymphocytes Relative 11 %  ? Lymphs Abs 0.8 0.7 - 4.0 K/uL  ? Monocytes Relative 9 %  ? Monocytes Absolute 0.6 0.1 - 1.0 K/uL  ? Eosinophils Relative 0 %  ? Eosinophils Absolute 0.0 0.0 - 0.5 K/uL  ? Basophils Relative 0 %  ? Basophils Absolute 0.0 0.0 - 0.1 K/uL  ? RBC Morphology MORPHOLOGY UNREMARKABLE   ? Immature Granulocytes 0 %  ? Abs Immature Granulocytes 0.02 0.00 - 0.07 K/uL  ?  Comment: Performed at [facility], [place] [address] [place], [place] [address]  ?Salicylate level     Status: Abnormal  ? Collection Time: 10/16/21  4:52 PM  ?Result Value Ref Range  ? Salicylate Lvl <3.3 (L) 7.0 - 30.0 mg/dL  ?  Comment: Performed at [facility], [place] [address] [place], [place] [address]  ?Acetaminophen level     Status: Abnormal  ? Collection Time: 10/16/21  4:52 PM  ?Result Value Ref Range  ? Acetaminophen (Tylenol), Serum <10 (L) 10 - 30 ug/mL  ?  Comment: (NOTE) ?Therapeutic concentrations vary significantly. A range of 10-30 ug/mL  ?may be an effective concentration for many patients. However, some  ?are best treated at concentrations outside of this range. ?Acetaminophen concentrations >150 ug/mL at 4 hours after ingestion  ?and >50 ug/mL at 12 hours after ingestion are often associated with  ?toxic reactions. ? ?Performed at [facility], [place] [address] ?[place],  [address] ?   ?TSH     Status: None  ? Collection Time: 10/16/21  5:06 PM  ?Result Value Ref Range  ? TSH 0.921 0.350 - 4.500 uIU/mL  ?  Comment: Performed by a 3rd Generation assay with a functional sensitivity of <=0.0

## 2021-10-18 ENCOUNTER — Encounter (HOSPITAL_COMMUNITY): Payer: Self-pay

## 2021-10-18 ENCOUNTER — Emergency Department (HOSPITAL_COMMUNITY): Payer: Medicare Other

## 2021-10-18 DIAGNOSIS — S022XXB Fracture of nasal bones, initial encounter for open fracture: Secondary | ICD-10-CM | POA: Diagnosis not present

## 2021-10-18 LAB — CBC WITH DIFFERENTIAL/PLATELET
Abs Immature Granulocytes: 0.04 10*3/uL (ref 0.00–0.07)
Basophils Absolute: 0 10*3/uL (ref 0.0–0.1)
Basophils Relative: 0 %
Eosinophils Absolute: 0 10*3/uL (ref 0.0–0.5)
Eosinophils Relative: 0 %
HCT: 37.7 % (ref 36.0–46.0)
Hemoglobin: 12.1 g/dL (ref 12.0–15.0)
Immature Granulocytes: 1 %
Lymphocytes Relative: 10 %
Lymphs Abs: 0.6 10*3/uL — ABNORMAL LOW (ref 0.7–4.0)
MCH: 28.2 pg (ref 26.0–34.0)
MCHC: 32.1 g/dL (ref 30.0–36.0)
MCV: 87.9 fL (ref 80.0–100.0)
Monocytes Absolute: 0.5 10*3/uL (ref 0.1–1.0)
Monocytes Relative: 8 %
Neutro Abs: 4.8 10*3/uL (ref 1.7–7.7)
Neutrophils Relative %: 81 %
Platelets: 65 10*3/uL — ABNORMAL LOW (ref 150–400)
RBC: 4.29 MIL/uL (ref 3.87–5.11)
RDW: 12.6 % (ref 11.5–15.5)
WBC: 5.9 10*3/uL (ref 4.0–10.5)
nRBC: 0 % (ref 0.0–0.2)

## 2021-10-18 LAB — URINALYSIS, ROUTINE W REFLEX MICROSCOPIC
Bilirubin Urine: NEGATIVE
Glucose, UA: NEGATIVE mg/dL
Hgb urine dipstick: NEGATIVE
Ketones, ur: 5 mg/dL — AB
Leukocytes,Ua: NEGATIVE
Nitrite: NEGATIVE
Protein, ur: NEGATIVE mg/dL
Specific Gravity, Urine: >1.046 — ABNORMAL HIGH (ref 1.005–1.030)
pH: 6 (ref 5.0–8.0)

## 2021-10-18 LAB — COMPREHENSIVE METABOLIC PANEL
ALT: 18 U/L (ref 0–44)
AST: 27 U/L (ref 15–41)
Albumin: 4.2 g/dL (ref 3.5–5.0)
Alkaline Phosphatase: 76 U/L (ref 38–126)
Anion gap: 6 (ref 5–15)
BUN: 19 mg/dL (ref 6–20)
CO2: 25 mmol/L (ref 22–32)
Calcium: 9 mg/dL (ref 8.9–10.3)
Chloride: 107 mmol/L (ref 98–111)
Creatinine, Ser: 0.86 mg/dL (ref 0.44–1.00)
GFR, Estimated: >60 mL/min (ref >60)
Glucose, Bld: 127 mg/dL — ABNORMAL HIGH (ref 70–99)
Potassium: 3.8 mmol/L (ref 3.5–5.1)
Sodium: 138 mmol/L (ref 135–145)
Total Bilirubin: 0.8 mg/dL (ref 0.3–1.2)
Total Protein: 7.4 g/dL (ref 6.5–8.1)

## 2021-10-18 LAB — RESP PANEL BY RT-PCR (FLU A&B, COVID) ARPGX2
Influenza A by PCR: NEGATIVE
Influenza B by PCR: NEGATIVE
SARS Coronavirus 2 by RT PCR: NEGATIVE

## 2021-10-18 LAB — TROPONIN I (HIGH SENSITIVITY)
Troponin I (High Sensitivity): 3 ng/L (ref <18)
Troponin I (High Sensitivity): 3 ng/L (ref <18)

## 2021-10-18 LAB — D-DIMER, QUANTITATIVE: D-Dimer, Quant: 1 ug/mL-FEU — ABNORMAL HIGH (ref 0.00–0.50)

## 2021-10-18 MED ORDER — IOHEXOL 350 MG/ML SOLN
75.0000 mL | Freq: Once | INTRAVENOUS | Status: AC | PRN
  Administered 2021-10-18: 75 mL via INTRAVENOUS

## 2021-10-18 MED ORDER — BACITRACIN ZINC 500 UNIT/GM EX OINT
TOPICAL_OINTMENT | Freq: Two times a day (BID) | CUTANEOUS | Status: DC
  Administered 2021-10-19: 1 via TOPICAL
  Filled 2021-10-18 (×3): qty 0.9

## 2021-10-18 MED ORDER — DOCUSATE SODIUM 100 MG PO CAPS: 100.0000 mg | ORAL_CAPSULE | Freq: Every day | ORAL | Status: DC | PRN

## 2021-10-18 MED ORDER — SODIUM CHLORIDE 0.9 % IV BOLUS
500.0000 mL | Freq: Once | INTRAVENOUS | Status: AC
  Administered 2021-10-18: 500 mL via INTRAVENOUS

## 2021-10-18 MED ORDER — ACETAMINOPHEN 500 MG PO TABS: 500.0000 mg | ORAL_TABLET | Freq: Four times a day (QID) | ORAL | Status: DC | PRN

## 2021-10-18 MED ORDER — OLANZAPINE 10 MG PO TABS
10.0000 mg | ORAL_TABLET | Freq: Two times a day (BID) | ORAL | Status: DC
  Administered 2021-10-18 – 2021-10-19 (×3): 10 mg via ORAL
  Filled 2021-10-18 (×3): qty 1

## 2021-10-18 NOTE — ED Provider Notes (Signed)
Pt signed out by Dr. Ralene Bathe pending UA and orthostatics.  UA with no sign of infection, but it did have ketones even after po and IVFs.  Dehydration is likely why her bp dropped yesterday.  However, she is no longer orthostatic and is able to ambulate without issues.  Unfortunately, she has a legal guardian who is refusing to come get her.  The guardian told the prior physician that she is not safe in the [place] [facility] and has been banned from the [employer].  SW and TOC have been consulted and the legal guardian is no longer answering the SWs calls.  Pt will have to stay overnight  until supervisors can be contacted to make a plan for a safe d/c.  Pt's meds and diet ordered. ?  ?Kaitlyn Pence, MD ?10/18/21 [phone] ? ?

## 2021-10-18 NOTE — ED Notes (Signed)
Patient transported to CT 

## 2021-10-18 NOTE — ED Notes (Addendum)
This RN went to go assess pt, and found room empty. Pt had removed monitoring and walked to bathroom by self. Accompanied back to room, and all monitoring equipment reapplied. Re-explained to pt that she must call out using call light if she needs further assistance to get up, as we need to keep her safe ?

## 2021-10-18 NOTE — Progress Notes (Addendum)
TOC CSW spoke with [facility] After hrs line ([phone]),requesting a call back from on call SW as [name] is not answering or returning calls. TOC to follow.  ? ? ?9:34 am ?CSW received a call from on-call SW [place]. CSW inquired about who will pick pt up from the hospital. MS Jimmye Norman stated she cannot pick pt up upon d/c. Ms. Jimmye Norman stated she will contact the on-call supervisor and inform her about the situation. pt does not meet the criteria for SNF placement. Pt does not have LTC insurance in place for placement as well. Pt will board in ED as [name] is refusing to pick pt up for D/C. TOC to follow.  ? ? ?Arlie Solomons.Keirra Zeimet, MSW, [place] ?[place]  Transitions of Care ?Clinical Social Worker I ?Direct Dial: [phone]  Fax: [phone] ?[email or web address]  ?

## 2021-10-18 NOTE — ED Notes (Signed)
This nurse called guardian [name] [phone] regarding discharge. No answer at this time, voicemail was left requesting return call.  ?

## 2021-10-18 NOTE — ED Notes (Signed)
Attempted to get out of bed by herself. Assisted her to walk to bathroom. Walked to bathroom with little issue. ?

## 2021-10-19 DIAGNOSIS — S022XXB Fracture of nasal bones, initial encounter for open fracture: Secondary | ICD-10-CM | POA: Diagnosis not present

## 2021-10-19 MED ORDER — BACITRACIN ZINC 500 UNIT/GM EX OINT: 1.0000 "application " | TOPICAL_OINTMENT | Freq: Two times a day (BID) | CUTANEOUS | 0 refills | Status: DC

## 2021-10-19 NOTE — Progress Notes (Addendum)
RNCM spoke with patient's LG Kaitlyn Duncan [phone] regarding discharge planning. LG suggest the patient have 2 hotel options: [place] and [place] off [address]. LG did not have a plan other than 2 hotel options, request patient make a decision on which hotel she wants to go to. RNCM notified TOC SW. ? ? ?Addendum ?TOC SW spoke with legal guardian Kaitlyn Duncan re: discharge. LG states since patient does not wish to go to either hotel offered to send her to a shelter via bus pass.  ?  ? ?TOC will continue to follow.  ?

## 2021-10-19 NOTE — Progress Notes (Addendum)
TOC CSW attempted to contact [name] [name], no response Left VM on office phone , unable to leave VM on Mobile phone.   ? ?Adden  ?11:00am ?CSW called Guardianship Supervisor Tracy  left HPPA complaint VM requesting a return call.  ?  ?Arlie Solomons.Kajah Santizo, MSW, [place] ?[facility]  Transitions of Care ?Clinical Social Worker I ?Direct Dial: [phone]  Fax: [phone] ?[email or web address]  ?

## 2021-10-19 NOTE — Discharge Instructions (Signed)
If you develop severe headache, chest pain, shortness of breath, new or worsening lightheadedness, passing out, or any other new/concerning symptoms then return to the ER for evaluation. ?

## 2021-10-19 NOTE — ED Provider Notes (Addendum)
Emergency Medicine Observation Re-evaluation Note ? ?Kaitlyn Duncan is a 51 y.o. female, seen on rounds today.  Pt initially presented to the ED for complaints of Loss of Consciousness ?Currently, the patient is resting in stretcher. ? ?Physical Exam  ?BP 119/79   Pulse 96   Temp 99 ?F (37.2 ?C) (Oral)   Resp 15   Wt 59.4 kg   SpO2 100%   BMI 24.74 kg/m?  ?Physical Exam ?General: no acute distress ?Cardiac: normal perfusion ?Lungs: normal effort ?Psych: no psychosis ? ?ED Course / MDM  ?EKG:  ? ?I have reviewed the labs performed to date as well as medications administered while in observation.  Recent changes in the last 24 hours include home meds. ? ?Plan  ?Current plan is for placement. ? Kaitlyn Duncan is not under involuntary commitment. ? ? ?  ?Sherwood Gambler, MD ?10/19/21 1429 ? ?Addendum: SW has updated plan and patient is to be discharged. She had declined options from SW. VS are normal. ?  ?Sherwood Gambler, MD ?10/19/21 1435 ? ?

## 2021-10-19 NOTE — ED Notes (Signed)
Discharge instructions reviewed with pt. Pt verbalized understanding. Pt given a bus pass. Pt ambulatory with no assistance needed to the lobby. Pt verbalizes she is able to walk by herself. No issues noted.  ?

## 2021-10-24 ENCOUNTER — Telehealth (HOSPITAL_COMMUNITY): Payer: Self-pay

## 2021-10-24 NOTE — BH Assessment (Signed)
Care Management - Follow Up [facility] Discharges  ? ?Patient has been placed in an inpatient psychiatric hospital ([facility]) on 09-25-21. ?

## 2021-10-28 ENCOUNTER — Encounter (HOSPITAL_COMMUNITY): Payer: Self-pay

## 2021-10-28 ENCOUNTER — Other Ambulatory Visit: Payer: Self-pay

## 2021-10-28 ENCOUNTER — Emergency Department (HOSPITAL_COMMUNITY)
Admission: EM | Admit: 2021-10-28 | Discharge: 2021-10-29 | Disposition: A | Discharge status: Home / Self Care | Payer: Medicare Other | Attending: Emergency Medicine | Admitting: Emergency Medicine

## 2021-10-28 DIAGNOSIS — D696 Thrombocytopenia, unspecified: Secondary | ICD-10-CM | POA: Insufficient documentation

## 2021-10-28 DIAGNOSIS — W19XXXA Unspecified fall, initial encounter: Secondary | ICD-10-CM | POA: Diagnosis not present

## 2021-10-28 DIAGNOSIS — N390 Urinary tract infection, site not specified: Secondary | ICD-10-CM | POA: Diagnosis not present

## 2021-10-28 DIAGNOSIS — S0031XA Abrasion of nose, initial encounter: Secondary | ICD-10-CM | POA: Diagnosis not present

## 2021-10-28 DIAGNOSIS — L309 Dermatitis, unspecified: Secondary | ICD-10-CM | POA: Diagnosis not present

## 2021-10-28 DIAGNOSIS — B372 Candidiasis of skin and nail: Secondary | ICD-10-CM

## 2021-10-28 DIAGNOSIS — S0993XA Unspecified injury of face, initial encounter: Secondary | ICD-10-CM | POA: Diagnosis present

## 2021-10-28 NOTE — ED Triage Notes (Signed)
Pt states that she has a scrape on her nose from falling and she needs some cream. Pt also states that she has burning with urination x 2 days.  ?

## 2021-10-29 ENCOUNTER — Other Ambulatory Visit (HOSPITAL_COMMUNITY): Payer: Self-pay

## 2021-10-29 ENCOUNTER — Encounter: Payer: Self-pay | Admitting: Oncology

## 2021-10-29 DIAGNOSIS — S0031XA Abrasion of nose, initial encounter: Secondary | ICD-10-CM | POA: Diagnosis not present

## 2021-10-29 LAB — COMPREHENSIVE METABOLIC PANEL
ALT: 13 U/L (ref 0–44)
AST: 22 U/L (ref 15–41)
Albumin: 4.6 g/dL (ref 3.5–5.0)
Alkaline Phosphatase: 78 U/L (ref 38–126)
Anion gap: 6 (ref 5–15)
BUN: 26 mg/dL — ABNORMAL HIGH (ref 6–20)
CO2: 25 mmol/L (ref 22–32)
Calcium: 9.4 mg/dL (ref 8.9–10.3)
Chloride: 106 mmol/L (ref 98–111)
Creatinine, Ser: 0.85 mg/dL (ref 0.44–1.00)
GFR, Estimated: >60 mL/min (ref >60)
Glucose, Bld: 127 mg/dL — ABNORMAL HIGH (ref 70–99)
Potassium: 3.5 mmol/L (ref 3.5–5.1)
Sodium: 137 mmol/L (ref 135–145)
Total Bilirubin: 0.6 mg/dL (ref 0.3–1.2)
Total Protein: 8.1 g/dL (ref 6.5–8.1)

## 2021-10-29 LAB — CBC WITH DIFFERENTIAL/PLATELET
Abs Immature Granulocytes: 0.07 10*3/uL (ref 0.00–0.07)
Basophils Absolute: 0 10*3/uL (ref 0.0–0.1)
Basophils Relative: 0 %
Eosinophils Absolute: 0 10*3/uL (ref 0.0–0.5)
Eosinophils Relative: 0 %
HCT: 40.5 % (ref 36.0–46.0)
Hemoglobin: 12.9 g/dL (ref 12.0–15.0)
Immature Granulocytes: 1 %
Lymphocytes Relative: 17 %
Lymphs Abs: 1.3 10*3/uL (ref 0.7–4.0)
MCH: 27.3 pg (ref 26.0–34.0)
MCHC: 31.9 g/dL (ref 30.0–36.0)
MCV: 85.8 fL (ref 80.0–100.0)
Monocytes Absolute: 0.7 10*3/uL (ref 0.1–1.0)
Monocytes Relative: 10 %
Neutro Abs: 5.3 10*3/uL (ref 1.7–7.7)
Neutrophils Relative %: 72 %
Platelets: 103 10*3/uL — ABNORMAL LOW (ref 150–400)
RBC: 4.72 MIL/uL (ref 3.87–5.11)
RDW: 12.3 % (ref 11.5–15.5)
WBC: 7.4 10*3/uL (ref 4.0–10.5)
nRBC: 0 % (ref 0.0–0.2)

## 2021-10-29 LAB — URINALYSIS, ROUTINE W REFLEX MICROSCOPIC
Bilirubin Urine: NEGATIVE
Glucose, UA: NEGATIVE mg/dL
Hgb urine dipstick: NEGATIVE
Ketones, ur: 5 mg/dL — AB
Nitrite: NEGATIVE
Protein, ur: 30 mg/dL — AB
Specific Gravity, Urine: 1.027 (ref 1.005–1.030)
pH: 5 (ref 5.0–8.0)

## 2021-10-29 MED ORDER — BACITRACIN ZINC 500 UNIT/GM EX OINT
TOPICAL_OINTMENT | Freq: Once | CUTANEOUS | Status: AC
  Filled 2021-10-29: qty 0.9

## 2021-10-29 MED ORDER — CEFTRIAXONE SODIUM 1 G IJ SOLR
1.0000 g | Freq: Once | INTRAMUSCULAR | Status: AC
  Administered 2021-10-29: 1 g via INTRAVENOUS
  Filled 2021-10-29: qty 10

## 2021-10-29 MED ORDER — HYDROGEN PEROXIDE 3 % EX SOLN
CUTANEOUS | Status: AC
  Filled 2021-10-29: qty 473

## 2021-10-29 MED ORDER — CLOTRIMAZOLE 1 % EX CREA
TOPICAL_CREAM | Freq: Once | CUTANEOUS | Status: AC
  Administered 2021-10-29: 1 via TOPICAL
  Filled 2021-10-29: qty 15

## 2021-10-29 MED ORDER — CEPHALEXIN 500 MG PO CAPS
1000.0000 mg | ORAL_CAPSULE | Freq: Two times a day (BID) | ORAL | 0 refills | Status: DC
  Filled 2021-10-29: qty 20, 5d supply, fill #0

## 2021-10-29 MED ORDER — CLOTRIMAZOLE 1 % EX CREA
TOPICAL_CREAM | CUTANEOUS | 0 refills | Status: DC
  Filled 2021-10-29: qty 15, 30d supply, fill #0
  Filled 2021-11-04: qty 14, 30d supply, fill #0
  Filled 2021-11-04: qty 15, 30d supply, fill #0

## 2021-10-29 MED ORDER — HYDROGEN PEROXIDE 3 % EX SOLN
Freq: Once | CUTANEOUS | Status: DC
Start: 2021-10-29 — End: 2021-10-29

## 2021-10-29 NOTE — ED Notes (Signed)
Spoke with [name] the patients guardian and she will not come pick the patient up. She did offer to get the patient a hotel for the night and the patient refused. Pt states she doesn't have a case worker and that this person is a fraud. Pt states that she is going to go to the extend stay hotel in the morning but the guardian states she has been banned from that hotel as well as the [employer]. Pt guardian states the patient has been discharged to the lobby in the past. Will speak with my charge nurse to decide what to do. ?

## 2021-10-29 NOTE — ED Provider Notes (Addendum)
[facility]-EMERGENCY DEPT Provider Note   CSN: [id number] Arrival date & time: 10/28/21  2311     History  Chief Complaint  Patient presents with   Abrasion   UTI    Kaitlyn Duncan is a 51 y.o. female.  Pt c/o dysuria. Hx uti. On current abx. No abd or flank pain. No fever or chills. No nv. Also c/o recent scrap to bridge of nose, and requests 'cream' for same. And also with bilateral groin area erythema/rash, mildly itchy.  Tetanus is up to date.   The history is provided by the patient and medical records.      Home Medications Prior to Admission medications   Medication Sig Start Date End Date Taking? Authorizing Provider  acetaminophen (TYLENOL) 500 MG tablet Take 500-1,000 mg by mouth every 6 (six) hours as needed for mild pain or headache.    [provider]  bacitracin ointment Apply 1 application. topically 2 (two) times daily. 10/19/21   Pricilla Loveless, MD  cetaphil (CETAPHIL) lotion Apply topically daily. Patient not taking: Reported on 10/16/2021 10/13/21   Phineas Inches, MD  docusate sodium (COLACE) 100 MG capsule Take 100-200 mg by mouth daily as needed for mild constipation.    [provider]  fluPHENAZine (PROLIXIN) 2.5 MG tablet Take 3 tablets (7.5 mg total) by mouth 2 (two) times daily for 3 days. Patient not taking: Reported on 10/16/2021 10/12/21 10/16/21  Phineas Inches, MD  fluPHENAZine decanoate (PROLIXIN) 25 MG/ML injection Inject 1 mL (25 mg total) into the muscle every 14 (fourteen) days. Administer every 14 days after 10-08-21. 10/08/21   Phineas Inches, MD  loratadine (CLARITIN) 10 MG tablet Take 1 tablet (10 mg total) by mouth daily. Patient taking differently: Take 10 mg by mouth daily as needed (for seasonal allergies). 10/13/21   Massengill, Harrold Donath, MD  OLANZapine (ZYPREXA) 10 MG tablet Take 1 tablet (10 mg total) by mouth 2 (two) times daily. Patient not taking: Reported on 10/16/2021 10/12/21  11/11/21  Phineas Inches, MD  simethicone (MYLICON) 125 MG chewable tablet Chew 125 mg by mouth every 6 (six) hours as needed for flatulence.    [provider]      Allergies    Patient has no known allergies.    Review of Systems   Review of Systems  Constitutional:  Negative for chills and fever.  HENT:  Negative for sore throat.   Eyes:  Negative for redness.  Respiratory:  Negative for shortness of breath.   Cardiovascular:  Negative for chest pain.  Gastrointestinal:  Negative for abdominal pain.  Genitourinary:  Positive for dysuria. Negative for flank pain and vaginal discharge.  Musculoskeletal:  Negative for back pain and neck pain.  Skin:  Negative for rash.  Neurological:  Negative for headaches.  Hematological:  Does not bruise/bleed easily.  Psychiatric/Behavioral:  Negative for confusion.    Physical Exam Updated Vital Signs BP (!) 132/99 (BP Location: Right Arm)   Pulse (!) 104   Temp 99.2 F (37.3 C) (Oral)   Resp 20   Ht 1.524 m (5')   Wt 59 kg   SpO2 96%   BMI 25.39 kg/m  Physical Exam Vitals and nursing note reviewed.  Constitutional:      Appearance: Normal appearance. She is well-developed.  HENT:     Head:     Comments: Abrasion/scab to nose, no sign of infection.     Nose: Nose normal.     Mouth/Throat:  Mouth: Mucous membranes are moist.  Eyes:     General: No scleral icterus.    Conjunctiva/sclera: Conjunctivae normal.  Neck:     Trachea: No tracheal deviation.  Cardiovascular:     Rate and Rhythm: Normal rate and regular rhythm.     Pulses: Normal pulses.     Heart sounds: Normal heart sounds. No murmur heard.   No friction rub. No gallop.  Pulmonary:     Effort: Pulmonary effort is normal. No respiratory distress.     Breath sounds: Normal breath sounds.  Abdominal:     General: Bowel sounds are normal. There is no distension.     Palpations: Abdomen is soft.     Tenderness: There is no abdominal tenderness.   Genitourinary:    Comments: No cva tenderness.  Musculoskeletal:        General: No swelling.     Cervical back: Normal range of motion and neck supple. No rigidity. No muscular tenderness.  Skin:    General: Skin is warm and dry.     Comments: Yeast dermatitis to bil groin crease area, esp right.   Neurological:     Mental Status: She is alert.     Comments: Alert, speech normal.   Psychiatric:        Mood and Affect: Mood normal.    ED Results / Procedures / Treatments   Labs (all labs ordered are listed, but only abnormal results are displayed) Results for orders placed or performed during the hospital encounter of 10/28/21  Urinalysis, Routine w reflex microscopic Urine, Clean Catch  Result Value Ref Range   Color, Urine YELLOW YELLOW   APPearance CLOUDY (A) CLEAR   Specific Gravity, Urine 1.027 1.005 - 1.030   pH 5.0 5.0 - 8.0   Glucose, UA NEGATIVE NEGATIVE mg/dL   Hgb urine dipstick NEGATIVE NEGATIVE   Bilirubin Urine NEGATIVE NEGATIVE   Ketones, ur 5 (A) NEGATIVE mg/dL   Protein, ur 30 (A) NEGATIVE mg/dL   Nitrite NEGATIVE NEGATIVE   Leukocytes,Ua SMALL (A) NEGATIVE   RBC / HPF 6-10 0 - 5 RBC/hpf   WBC, UA 21-50 0 - 5 WBC/hpf   Bacteria, UA FEW (A) NONE SEEN   Squamous Epithelial / LPF 0-5 0 - 5   Mucus PRESENT    Ca Oxalate Crys, UA PRESENT    DG Pelvis 1-2 Views  Result Date: 10/17/2021 CLINICAL DATA:  Fall EXAM: PELVIS - 1-2 VIEW COMPARISON:  None. FINDINGS: There is no evidence of pelvic fracture or diastasis. Sclerotic lesion within the right sacral ala is most in keeping with a a bone island. Soft tissues are unremarkable. IMPRESSION: No acute fracture or dislocation Electronically Signed   By: Helyn Numbers M.D.   On: 10/17/2021 23:59   DG Ankle Complete Left  Result Date: 10/16/2021 CLINICAL DATA:  Fall EXAM: LEFT ANKLE COMPLETE - 3 VIEW COMPARISON:  Left ankle radiograph dated June 24, 2021 FINDINGS: There is no evidence of fracture, dislocation,  or joint effusion. There is no evidence of arthropathy or other focal bone abnormality. Soft tissues are unremarkable. IMPRESSION: No acute osseous abnormality. Electronically Signed   By: Allegra Lai M.D.   On: 10/16/2021 16:50   CT Head Wo Contrast  Result Date: 10/18/2021 CLINICAL DATA:  Head trauma, abnormal mental status (Age 57-64y) EXAM: CT HEAD WITHOUT CONTRAST TECHNIQUE: Contiguous axial images were obtained from the base of the skull through the vertex without intravenous contrast. RADIATION DOSE REDUCTION: This exam was performed according  to the departmental dose-optimization program which includes automated exposure control, adjustment of the mA and/or kV according to patient size and/or use of iterative reconstruction technique. COMPARISON:  10/16/2021 FINDINGS: Brain: Normal anatomic configuration. Parenchymal volume loss is stable since prior examination, though slightly greater than would be typically expected given the patient's age patient's age. Stable moderate periventricular white matter changes are present likely reflecting the sequela of small vessel ischemia. No abnormal intra or extra-axial mass lesion or fluid collection. No abnormal mass effect or midline shift. No evidence of acute intracranial hemorrhage or infarct. Ventricular size is normal. Cerebellum unremarkable. Vascular: No asymmetric hyperdense vasculature at the skull base. Skull: Intact Sinuses/Orbits: Left maxillary antrostomy has been performed. Moderate mucosal thickening is seen within the left maxillary sinus. Remaining paranasal sinuses are clear. Orbits are unremarkable. Other: Mastoid air cells and middle ear cavities are clear. IMPRESSION: No acute intracranial abnormality. Parenchymal atrophy, stable since prior examination, advanced for age. Moderate periventricular white matter changes, relatively advanced given the patient's age, possibly the sequela of small vessel ischemia. Left maxillary sinus disease.  Electronically Signed   By: Helyn Numbers M.D.   On: 10/18/2021 00:12   CT Head Wo Contrast  Result Date: 10/16/2021 CLINICAL DATA:  Trauma, fall. EXAM: CT HEAD WITHOUT CONTRAST CT MAXILLOFACIAL WITHOUT CONTRAST CT CERVICAL SPINE WITHOUT CONTRAST TECHNIQUE: Multidetector CT imaging of the head, cervical spine, and maxillofacial structures were performed using the standard protocol without intravenous contrast. Multiplanar CT image reconstructions of the cervical spine and maxillofacial structures were also generated. RADIATION DOSE REDUCTION: This exam was performed according to the departmental dose-optimization program which includes automated exposure control, adjustment of the mA and/or kV according to patient size and/or use of iterative reconstruction technique. COMPARISON:  CT head 06/30/2021. cervical spine CT 06/21/2021. FINDINGS: CT HEAD FINDINGS Brain: No evidence of acute infarction, hemorrhage, hydrocephalus, extra-axial collection or mass lesion/mass effect. There is stable mild periventricular and deep white matter hypodensity. Vascular: No hyperdense vessel or unexpected calcification. Skull: Normal. Negative for fracture or focal lesion. Other: There is left frontal scalp soft tissue swelling. CT MAXILLOFACIAL FINDINGS Osseous: There are nondepressed bilateral nasal bone fractures. There is nasal septal deviation to the left. No evidence for dislocation. Orbits: Negative. No traumatic or inflammatory finding. Sinuses: There are chronic changes of the left maxillary sinus with wall thickening and opacification. Mastoid air cells are clear. Soft tissues: There is soft tissue swelling in the small amount of air overlying the nose likely related to laceration. There is mild left frontal scalp soft tissue swelling. CT CERVICAL SPINE FINDINGS Alignment: Normal. Skull base and vertebrae: No acute fracture. No primary bone lesion or focal pathologic process. Soft tissues and spinal canal: No  prevertebral fluid or swelling. No visible canal hematoma. Disc levels: There is mild disc space narrowing with endplate osteophyte formation at C5-C6 and C6-C7 compatible with moderate degenerative change. Facet arthropathy is seen throughout. There is moderate bilateral neural foraminal stenosis at C5-C6 secondary to uncovertebral spurring. Upper chest: Negative. Other: None. IMPRESSION: 1. No acute intracranial process. 2. Nondisplaced bilateral nasal bone fractures. 3. No acute fracture or traumatic malalignment of the cervical spine. 4. Stable white matter disease in the brain. Electronically Signed   By: Darliss Cheney M.D.   On: 10/16/2021 17:18   CT Angio Chest PE W/Cm &/Or Wo Cm  Result Date: 10/18/2021 CLINICAL DATA:  Syncopal episode. EXAM: CT ANGIOGRAPHY CHEST WITH CONTRAST TECHNIQUE: Multidetector CT imaging of the chest was performed using the standard  protocol during bolus administration of intravenous contrast. Multiplanar CT image reconstructions and MIPs were obtained to evaluate the vascular anatomy. RADIATION DOSE REDUCTION: This exam was performed according to the departmental dose-optimization program which includes automated exposure control, adjustment of the mA and/or kV according to patient size and/or use of iterative reconstruction technique. CONTRAST:  75mL OMNIPAQUE IOHEXOL 350 MG/ML SOLN COMPARISON:  June 30, 2021 FINDINGS: Cardiovascular: Satisfactory opacification of the pulmonary arteries to the segmental level. No evidence of pulmonary embolism. Normal heart size. No pericardial effusion. Mediastinum/Nodes: No enlarged mediastinal, hilar, or axillary lymph nodes. Thyroid gland, trachea, and esophagus demonstrate no significant findings. Lungs/Pleura: Mild atelectasis is seen within the posterior aspects of bilateral lung bases. There is no evidence of acute infiltrate, pleural effusion or pneumothorax. Upper Abdomen: No acute abnormality. Musculoskeletal: A predominant  stable 3.3 cm x 1.3 cm elongated gallstone is seen within the lumen of an otherwise normal-appearing gallbladder. The bilateral adrenal glands are partially imaged and slightly nodular in appearance. Review of the MIP images confirms the above findings. IMPRESSION: 1. No evidence of pulmonary embolism or other acute cardiopulmonary disease. 2. Cholelithiasis without evidence of acute cholecystitis. Further correlation with nonemergent right upper quadrant ultrasound is recommended. Electronically Signed   By: Aram Candela M.D.   On: 10/18/2021 02:39   CT Cervical Spine Wo Contrast  Result Date: 10/16/2021 CLINICAL DATA:  Trauma, fall. EXAM: CT HEAD WITHOUT CONTRAST CT MAXILLOFACIAL WITHOUT CONTRAST CT CERVICAL SPINE WITHOUT CONTRAST TECHNIQUE: Multidetector CT imaging of the head, cervical spine, and maxillofacial structures were performed using the standard protocol without intravenous contrast. Multiplanar CT image reconstructions of the cervical spine and maxillofacial structures were also generated. RADIATION DOSE REDUCTION: This exam was performed according to the departmental dose-optimization program which includes automated exposure control, adjustment of the mA and/or kV according to patient size and/or use of iterative reconstruction technique. COMPARISON:  CT head 06/30/2021. cervical spine CT 06/21/2021. FINDINGS: CT HEAD FINDINGS Brain: No evidence of acute infarction, hemorrhage, hydrocephalus, extra-axial collection or mass lesion/mass effect. There is stable mild periventricular and deep white matter hypodensity. Vascular: No hyperdense vessel or unexpected calcification. Skull: Normal. Negative for fracture or focal lesion. Other: There is left frontal scalp soft tissue swelling. CT MAXILLOFACIAL FINDINGS Osseous: There are nondepressed bilateral nasal bone fractures. There is nasal septal deviation to the left. No evidence for dislocation. Orbits: Negative. No traumatic or inflammatory  finding. Sinuses: There are chronic changes of the left maxillary sinus with wall thickening and opacification. Mastoid air cells are clear. Soft tissues: There is soft tissue swelling in the small amount of air overlying the nose likely related to laceration. There is mild left frontal scalp soft tissue swelling. CT CERVICAL SPINE FINDINGS Alignment: Normal. Skull base and vertebrae: No acute fracture. No primary bone lesion or focal pathologic process. Soft tissues and spinal canal: No prevertebral fluid or swelling. No visible canal hematoma. Disc levels: There is mild disc space narrowing with endplate osteophyte formation at C5-C6 and C6-C7 compatible with moderate degenerative change. Facet arthropathy is seen throughout. There is moderate bilateral neural foraminal stenosis at C5-C6 secondary to uncovertebral spurring. Upper chest: Negative. Other: None. IMPRESSION: 1. No acute intracranial process. 2. Nondisplaced bilateral nasal bone fractures. 3. No acute fracture or traumatic malalignment of the cervical spine. 4. Stable white matter disease in the brain. Electronically Signed   By: Darliss Cheney M.D.   On: 10/16/2021 17:18   DG Chest Portable 1 View  Result Date: 10/16/2021 CLINICAL DATA:  Fall EXAM: PORTABLE CHEST 1 VIEW COMPARISON:  April 2022 FINDINGS: The heart size and mediastinal contours are within normal limits. Both lungs are clear. No pleural effusion or pneumothorax. The visualized skeletal structures are unremarkable. IMPRESSION: No acute process in the chest. Electronically Signed   By: Guadlupe Spanish M.D.   On: 10/16/2021 16:51   CT Maxillofacial Wo Contrast  Result Date: 10/16/2021 CLINICAL DATA:  Trauma, fall. EXAM: CT HEAD WITHOUT CONTRAST CT MAXILLOFACIAL WITHOUT CONTRAST CT CERVICAL SPINE WITHOUT CONTRAST TECHNIQUE: Multidetector CT imaging of the head, cervical spine, and maxillofacial structures were performed using the standard protocol without intravenous contrast.  Multiplanar CT image reconstructions of the cervical spine and maxillofacial structures were also generated. RADIATION DOSE REDUCTION: This exam was performed according to the departmental dose-optimization program which includes automated exposure control, adjustment of the mA and/or kV according to patient size and/or use of iterative reconstruction technique. COMPARISON:  CT head 06/30/2021. cervical spine CT 06/21/2021. FINDINGS: CT HEAD FINDINGS Brain: No evidence of acute infarction, hemorrhage, hydrocephalus, extra-axial collection or mass lesion/mass effect. There is stable mild periventricular and deep white matter hypodensity. Vascular: No hyperdense vessel or unexpected calcification. Skull: Normal. Negative for fracture or focal lesion. Other: There is left frontal scalp soft tissue swelling. CT MAXILLOFACIAL FINDINGS Osseous: There are nondepressed bilateral nasal bone fractures. There is nasal septal deviation to the left. No evidence for dislocation. Orbits: Negative. No traumatic or inflammatory finding. Sinuses: There are chronic changes of the left maxillary sinus with wall thickening and opacification. Mastoid air cells are clear. Soft tissues: There is soft tissue swelling in the small amount of air overlying the nose likely related to laceration. There is mild left frontal scalp soft tissue swelling. CT CERVICAL SPINE FINDINGS Alignment: Normal. Skull base and vertebrae: No acute fracture. No primary bone lesion or focal pathologic process. Soft tissues and spinal canal: No prevertebral fluid or swelling. No visible canal hematoma. Disc levels: There is mild disc space narrowing with endplate osteophyte formation at C5-C6 and C6-C7 compatible with moderate degenerative change. Facet arthropathy is seen throughout. There is moderate bilateral neural foraminal stenosis at C5-C6 secondary to uncovertebral spurring. Upper chest: Negative. Other: None. IMPRESSION: 1. No acute intracranial process. 2.  Nondisplaced bilateral nasal bone fractures. 3. No acute fracture or traumatic malalignment of the cervical spine. 4. Stable white matter disease in the brain. Electronically Signed   By: Darliss Cheney M.D.   On: 10/16/2021 17:18      EKG None  Radiology No results found.  Procedures Procedures    Medications Ordered in ED Medications  cefTRIAXone (ROCEPHIN) 1 g in sodium chloride 0.9 % 100 mL IVPB (has no administration in time range)  clotrimazole (LOTRIMIN) 1 % cream (has no administration in time range)  bacitracin ointment (has no administration in time range)    ED Course/ Medical Decision Making/ A&P                           Medical Decision Making Problems Addressed: Abrasion, nose w/o infection: chronic illness or injury Acute UTI: acute illness or injury with systemic symptoms Thrombocytopenia (HCC): chronic illness or injury Yeast dermatitis: acute illness or injury  Amount and/or Complexity of Data Reviewed External Data Reviewed: notes. Labs: ordered. Decision-making details documented in ED Course.  Risk OTC drugs. Prescription drug management.   Iv ns. Continuous pulse ox and cardiac monitoring. Labs ordered/sent.  Reviewed nursing notes and prior charts for additional  history. External reports reviewed.   Cardiac monitor: sinus rhythm, rate 90.   Labs reviewed/interpreted by me - UA c/w uti.   Rocephin iv.   Po fluids.  Had prior single suture placed ~ 2 weeks ago - suture removed.   Groin area cleaned/dried, clotrimazole cream. Bacitracin to nose.  Pt appears stable for d/c.  Rec pcp f/u.  Return precautions provided.             Final Clinical Impression(s) / ED Diagnoses Final diagnoses:  None    Rx / DC Orders ED Discharge Orders     None         Cathren Laine, MD 10/29/21 [phone]

## 2021-10-29 NOTE — Discharge Instructions (Addendum)
It was our pleasure to provide your ER care today - we hope that you feel better. ? ?Keep skin of groin area very clean/dry. Wash with warm soap and water 2x/day and thoroughly dry (you can use hair dryer on cool or warm setting to dry) - then apply thin coat of clotrimazole cream.  ? ?For urine infection, take antibiotic as prescribed. Drink plenty of fluids/stay well hydrated. ? ?Return to ER if worse, new symptoms, high fevers, new/severe pain, spreading redness, or other concern.  ?

## 2021-10-29 NOTE — ED Notes (Signed)
Pt discharged to lobby per charge nurse. Social worker/guardian already contacted by previous nurse. Pt A+Ox4, able to stand up on her own.  ?

## 2021-10-31 ENCOUNTER — Encounter (HOSPITAL_COMMUNITY): Payer: Self-pay

## 2021-10-31 ENCOUNTER — Emergency Department (HOSPITAL_COMMUNITY)
Admission: EM | Admit: 2021-10-31 | Discharge: 2021-11-01 | Disposition: A | Discharge status: Home / Self Care | Payer: Medicare Other | Attending: Physician Assistant | Admitting: Physician Assistant

## 2021-10-31 ENCOUNTER — Other Ambulatory Visit: Payer: Self-pay

## 2021-10-31 DIAGNOSIS — S0031XD Abrasion of nose, subsequent encounter: Secondary | ICD-10-CM | POA: Insufficient documentation

## 2021-10-31 DIAGNOSIS — J45909 Unspecified asthma, uncomplicated: Secondary | ICD-10-CM | POA: Insufficient documentation

## 2021-10-31 DIAGNOSIS — Z4800 Encounter for change or removal of nonsurgical wound dressing: Secondary | ICD-10-CM | POA: Diagnosis present

## 2021-10-31 DIAGNOSIS — X58XXXD Exposure to other specified factors, subsequent encounter: Secondary | ICD-10-CM | POA: Diagnosis not present

## 2021-10-31 DIAGNOSIS — Z5900 Homelessness unspecified: Secondary | ICD-10-CM | POA: Insufficient documentation

## 2021-10-31 DIAGNOSIS — R300 Dysuria: Secondary | ICD-10-CM | POA: Insufficient documentation

## 2021-10-31 DIAGNOSIS — Z5189 Encounter for other specified aftercare: Secondary | ICD-10-CM

## 2021-10-31 LAB — URINALYSIS, ROUTINE W REFLEX MICROSCOPIC
Bilirubin Urine: NEGATIVE
Glucose, UA: NEGATIVE mg/dL
Hgb urine dipstick: NEGATIVE
Ketones, ur: 20 mg/dL — AB
Leukocytes,Ua: NEGATIVE
Nitrite: NEGATIVE
Protein, ur: 30 mg/dL — AB
Specific Gravity, Urine: 1.028 (ref 1.005–1.030)
pH: 5 (ref 5.0–8.0)

## 2021-10-31 MED ORDER — BACITRACIN ZINC 500 UNIT/GM EX OINT
TOPICAL_OINTMENT | Freq: Once | CUTANEOUS | Status: AC
  Filled 2021-10-31: qty 1.8

## 2021-10-31 NOTE — ED Triage Notes (Signed)
Patient said she has been exposed to second hand cigarette smoke for 2 hours or more. She wants an Xray to make sure she is ok. She said she is very allergic and sensitive to it. She wants to make sure her lungs are ok.  ?

## 2021-10-31 NOTE — ED Provider Triage Note (Signed)
Emergency Medicine Provider Triage Evaluation Note ? ?Kaitlyn Duncan , a 51 y.o. female  was evaluated in triage.  Pt complains of multiple complaints.  States she has had persistent dysuria.  States she was diagnosed with UTI however not been on medications.  No flank pain, emesis, fever. ? ?Patient also admits to wound to nasal bridge.  States it has been "oozing."  No swelling or redness. ? ?Patient also states earlier today she developed a cough after being exposed to people smoking cigarettes around her.  States she is "severely allergic."  No shortness of breath, chest pain, back pain.  She is not currently coughing.  No sensation of throat closing, rash. ? ? ? ?Review of Systems  ?Positive: Dysuria, cough ?Negative: Fever, Emesis, abdominal pain ? ?Physical Exam  ?BP (!) 147/99 (BP Location: Right Arm)   Pulse 94   Temp 97.8 ?F (36.6 ?C) (Oral)   Resp 18   Ht 5' (1.524 m)   Wt 59 kg   SpO2 96%   BMI 25.39 kg/m?  ?Gen:   Awake, no distress   ?Resp:  Normal effort  ?MSK:   Moves extremities without difficulty  ?Other:   ? ?Medical Decision Making  ?Medically screening exam initiated at 10:31 PM.  Appropriate orders placed.  Carlyon Shadow Lasseigne was informed that the remainder of the evaluation will be completed by another provider, this initial triage assessment does not replace that evaluation, and the importance of remaining in the ED until their evaluation is complete. ? ?Dysuria, cough, rash ?  ?Willa Brocks A, PA-C ?10/31/21 2233 ? ?

## 2021-11-01 NOTE — ED Provider Notes (Signed)
?[facility] ?Provider Note ? ? ?CSN: [id number] ?Arrival date & time: 10/31/21  2207 ? ?  ? ?History ? ?Chief Complaint  ?Patient presents with  ? Second Hand Smoke Exposure  ? ? ?Kaitlyn Duncan is a 51 y.o. female. ? ?HPI ? ?Patient with medical history including asthma, gallstones, GERD, presents emerged department with multiple complaints.  Patient is a poor historian and changed her main complaint multiple times.  She initially states about a triage that she was here because she inhaled some smoke and wanted her lungs to be checked out but on my assessment she states that she is not here for that but she is here for her nose, states that she noticed that the wound on her nose has gotten worse she is having drainage from the area, states that she has not been using the cream that was provided to her she endorse any fevers chills nosebleeds or any other complaints.  Patient then tells me that she is actually here because she has been having difficulty sleeping, she states that she not been able to sleep because she does not have a regular place to stay, she has not endorsing suicidal homicidal ideations, she is not endorse chest pain shortness of breath stomach pains nausea vomiting diarrhea denies any vaginal discharge vaginal bleeding. ? ?I reviewed patient's chart been seen multiple times for variety of reasons, most recently was seen 2 days ago for wound on her nose as well as dysuria, UA was obtained which was unremarkable, she was given Rocephin and was discharged home, she was also given Bactrim for her nose. ? ?Home Medications ?Prior to Admission medications   ?Medication Sig Start Date End Date Taking? Authorizing Provider  ?acetaminophen (TYLENOL) 500 MG tablet Take 500-1,000 mg by mouth every 6 (six) hours as needed for mild pain or headache.    [provider]  ?bacitracin ointment Apply 1 application. topically 2 (two) times daily. 10/19/21   Sherwood Gambler, MD  ?cephALEXin (KEFLEX) 500 MG capsule Take 2 capsules (1,000 mg total) by mouth 2 (two) times daily. 10/29/21   Lajean Saver, MD  ?cetaphil (CETAPHIL) lotion Apply topically daily. ?Patient not taking: Reported on 10/16/2021 10/13/21   Janine Limbo, MD  ?clotrimazole (LOTRIMIN) 1 % cream Apply to affected area 2 times daily 10/29/21   Lajean Saver, MD  ?docusate sodium (COLACE) 100 MG capsule Take 100-200 mg by mouth daily as needed for mild constipation.    [provider]  ?fluPHENAZine (PROLIXIN) 2.5 MG tablet Take 3 tablets (7.5 mg total) by mouth 2 (two) times daily for 3 days. ?Patient not taking: Reported on 10/16/2021 10/12/21 10/16/21  Janine Limbo, MD  ?fluPHENAZine decanoate (PROLIXIN) 25 MG/ML injection Inject 1 mL (25 mg total) into the muscle every 14 (fourteen) days. Administer every 14 days after 10-08-21. 10/08/21   Janine Limbo, MD  ?loratadine (CLARITIN) 10 MG tablet Take 1 tablet (10 mg total) by mouth daily. ?Patient taking differently: Take 10 mg by mouth daily as needed (for seasonal allergies). 10/13/21   Massengill, Ovid Curd, MD  ?OLANZapine (ZYPREXA) 10 MG tablet Take 1 tablet (10 mg total) by mouth 2 (two) times daily. ?Patient not taking: Reported on 10/16/2021 10/12/21 11/11/21  Janine Limbo, MD  ?simethicone (MYLICON) 193 MG chewable tablet Chew 125 mg by mouth every 6 (six) hours as needed for flatulence.    [provider]  ?   ? ?Allergies    ?Patient has no known allergies.   ? ?  Review of Systems   ?Review of Systems  ?Constitutional:  Negative for chills and fever.  ?Respiratory:  Negative for shortness of breath.   ?Cardiovascular:  Negative for chest pain.  ?Gastrointestinal:  Negative for abdominal pain.  ?Skin:  Positive for wound.  ?Neurological:  Negative for headaches.  ?Psychiatric/Behavioral:  Positive for sleep disturbance.   ? ?Physical Exam ?Updated Vital Signs ?BP (!) 147/99 (BP Location: Right Arm)   Pulse 94   Temp 97.8 ?F (36.6  ?C) (Oral)   Resp 18   Ht 5' (1.524 m)   Wt 59 kg   SpO2 96%   BMI 25.39 kg/m?  ?Physical Exam ?Vitals and nursing note reviewed.  ?Constitutional:   ?   General: She is not in acute distress. ?   Appearance: She is not ill-appearing.  ?HENT:  ?   Head: Normocephalic and atraumatic.  ?   Nose: No congestion.  ?   Comments: Patient has a noted abrasion on the right aspect of her nose, no surrounding erythema, granulation of the skin but overall appears to be healing well, no drainage or discharge present.  No evidence of infection noted. ?   Mouth/Throat:  ?   Mouth: Mucous membranes are moist.  ?   Pharynx: Oropharynx is clear.  ?Eyes:  ?   Conjunctiva/sclera: Conjunctivae normal.  ?Cardiovascular:  ?   Rate and Rhythm: Normal rate and regular rhythm.  ?   Pulses: Normal pulses.  ?   Heart sounds: No murmur heard. ?  No friction rub. No gallop.  ?Pulmonary:  ?   Effort: No respiratory distress.  ?   Breath sounds: No wheezing, rhonchi or rales.  ?Abdominal:  ?   Palpations: Abdomen is soft.  ?   Tenderness: There is no abdominal tenderness. There is no right CVA tenderness or left CVA tenderness.  ?   Comments: Abdomen nondistended normal active bowel sounds, dull to percussion, abdomen is nontender to palpation no guarding rebound tenderness or peritoneal sign negative Murphy sign McBurney point.  ?Musculoskeletal:  ?   Right lower leg: No edema.  ?   Left lower leg: No edema.  ?Skin: ?   General: Skin is warm and dry.  ?Neurological:  ?   Mental Status: She is alert.  ?   Comments: No facial asymmetry, no difficulty with word finding, following two-step commands, no unilateral weakness present.  ?Psychiatric:     ?   Mood and Affect: Mood normal.  ? ? ?ED Results / Procedures / Treatments   ?Labs ?(all labs ordered are listed, but only abnormal results are displayed) ?Labs Reviewed  ?URINALYSIS, ROUTINE W REFLEX MICROSCOPIC - Abnormal; Notable for the following components:  ?    Result Value  ? Ketones, ur 20  (*)   ? Protein, ur 30 (*)   ? Bacteria, UA RARE (*)   ? All other components within normal limits  ?URINE CULTURE  ? ? ?EKG ?None ? ?Radiology ?No results found. ? ?Procedures ?Procedures  ? ? ?Medications Ordered in ED ?Medications  ?bacitracin ointment (has no administration in time range)  ? ? ?ED Course/ Medical Decision Making/ A&P ?  ?                        ?Medical Decision Making ?Risk ?OTC drugs. ? ? ?This patient presents to the ED for concern of sleep disturbance and wound on nose, this involves an extensive number of treatment options, and is  a complaint that carries with it a high risk of complications and morbidity.  The differential diagnosis includes t psychiatric emergency, cellulitis, deep tissue infection ? ? ? ?Additional history obtained: ? ?Additional history obtained from N/A ?External records from outside source obtained and reviewed including previous lab work, imaging, ER notes ? ? ?Co morbidities that complicate the patient evaluation ? ?N/A ? ?Social Determinants of Health: ? ?Homelessness-we will provide patient with shelters within the area. ? ? ? ?Lab Tests: ? ?I Ordered, and personally interpreted labs.  The pertinent results include: UA negative for nitrates leukocytes red blood cells white blood cells shows rare bacteria ? ? ?Imaging Studies ordered: ? ?I ordered imaging studies including N/A ?I independently visualized and interpreted imaging which showed N/A ?I agree with the radiologist interpretation ? ? ?Cardiac Monitoring: ? ?The patient was maintained on a cardiac monitor.  I personally viewed and interpreted the cardiac monitored which showed an underlying rhythm of: N/A ? ? ?Medicines ordered and prescription drug management: ? ?I ordered medication including bacitracin for nose ?I have reviewed the patients home medicines and have made adjustments as needed ? ?Critical Interventions: ? ?N/I ? ? ?Reevaluation: ? ?Presents with multiple complaints, triage obtained UA,  unremarkable, she has a benign physical exam, no evidence of cellulitis or deep tissue infection of the nose, will redress patient's wound on nose prior with antibiotics and discharged home she is given this p

## 2021-11-01 NOTE — Discharge Instructions (Signed)
The wound on your nose is healing well, please continue to apply bacitracin to the area.  Keep the area clean and change out the dressings frequently.  Your urine is unremarkable please remember to stay hydrated. ? ?I have given you information for shelters within the area please call. ? ?Come back to the emergency department if you develop chest pain, shortness of breath, severe abdominal pain, uncontrolled nausea, vomiting, diarrhea. ? ?

## 2021-11-03 ENCOUNTER — Encounter: Payer: Self-pay | Admitting: Oncology

## 2021-11-03 ENCOUNTER — Encounter (HOSPITAL_COMMUNITY): Payer: Self-pay

## 2021-11-03 ENCOUNTER — Other Ambulatory Visit: Payer: Self-pay

## 2021-11-03 ENCOUNTER — Emergency Department (HOSPITAL_COMMUNITY)
Admission: EM | Admit: 2021-11-03 | Discharge: 2021-11-03 | Discharge status: Against Medical Advice | Payer: Medicare Other | Attending: Emergency Medicine | Admitting: Emergency Medicine

## 2021-11-03 ENCOUNTER — Emergency Department (HOSPITAL_COMMUNITY)
Admission: EM | Admit: 2021-11-03 | Discharge: 2021-11-03 | Disposition: A | Discharge status: Home / Self Care | Payer: Medicare Other | Source: Home / Self Care | Attending: Emergency Medicine | Admitting: Emergency Medicine

## 2021-11-03 DIAGNOSIS — M79672 Pain in left foot: Secondary | ICD-10-CM | POA: Insufficient documentation

## 2021-11-03 DIAGNOSIS — Z5321 Procedure and treatment not carried out due to patient leaving prior to being seen by health care provider: Secondary | ICD-10-CM | POA: Diagnosis not present

## 2021-11-03 DIAGNOSIS — Z5900 Homelessness unspecified: Secondary | ICD-10-CM | POA: Insufficient documentation

## 2021-11-03 DIAGNOSIS — I10 Essential (primary) hypertension: Secondary | ICD-10-CM | POA: Diagnosis not present

## 2021-11-03 DIAGNOSIS — M79671 Pain in right foot: Secondary | ICD-10-CM | POA: Insufficient documentation

## 2021-11-03 LAB — URINE CULTURE: Culture: 30000 — AB

## 2021-11-03 MED ORDER — IBUPROFEN 800 MG PO TABS
800.0000 mg | ORAL_TABLET | Freq: Once | ORAL | Status: AC
  Administered 2021-11-03: 800 mg via ORAL
  Filled 2021-11-03: qty 1

## 2021-11-03 NOTE — ED Notes (Signed)
Pt refused d/c vital signs, pt stated "What kind of healthcare are you" Pt refused to listen to my discharge instructions. I provided pt with new pair of socks. ?

## 2021-11-03 NOTE — ED Notes (Signed)
Pt refused to leave room, pt required [employer] and police escort out of ED. ?

## 2021-11-03 NOTE — ED Notes (Signed)
Pt ambulatory without assistance.  

## 2021-11-03 NOTE — Discharge Instructions (Addendum)
You were seen in the emergency department for foot pain. ? ?As we discussed I do not think your foot is infected or there is a hematoma.  ? ?I believe your pain is likely from your blisters and excessive walking. We have changed out your socks for dry ones.  ? ?I recommend trimming your toe nails and letting your feet dry at night.  ?

## 2021-11-03 NOTE — ED Triage Notes (Signed)
Pt BIB EMS. Pt complains of a scrape to her nose that has been there for weeks. Pt also states that her toes are hurting.  ?

## 2021-11-03 NOTE — ED Triage Notes (Signed)
Per EMS-Patient left earlier today and has been walking Patient is homeless. Patient has a blister to the left foot. ?

## 2021-11-03 NOTE — ED Provider Notes (Signed)
?[facility] DEPT ?Provider Note ? ? ?CSN: [id number] ?Arrival date & time: 11/03/21  1824 ? ?  ? ?History ? ?Chief Complaint  ?Patient presents with  ? Foot Pain  ? ? ?Kaitlyn Duncan is a 51 y.o. female with history of brain metastases, melanoma, psychosis, schizophrenia spectrum disorder with psychotic disorder type. ? ?Patient presents emergency department complaining of bilateral foot pain.  Patient presented to the department earlier, but left without being seen.  She states that she has been walking more recently.  States that the pain in her left foot is worse than the right.  She has not taken anything for her symptoms.  She believes that she may have a hematoma. ? ? ?Foot Pain ? ? ?  ? ?Home Medications ?Prior to Admission medications   ?Medication Sig Start Date End Date Taking? Authorizing Provider  ?acetaminophen (TYLENOL) 500 MG tablet Take 500-1,000 mg by mouth every 6 (six) hours as needed for mild pain or headache.    [provider]  ?bacitracin ointment Apply 1 application. topically 2 (two) times daily. 10/19/21   Sherwood Gambler, MD  ?cephALEXin (KEFLEX) 500 MG capsule Take 2 capsules (1,000 mg total) by mouth 2 (two) times daily. 10/29/21   Lajean Saver, MD  ?cetaphil (CETAPHIL) lotion Apply topically daily. ?Patient not taking: Reported on 10/16/2021 10/13/21   Janine Limbo, MD  ?clotrimazole (LOTRIMIN) 1 % cream Apply to affected area 2 times daily 10/29/21   Lajean Saver, MD  ?docusate sodium (COLACE) 100 MG capsule Take 100-200 mg by mouth daily as needed for mild constipation.    [provider]  ?fluPHENAZine (PROLIXIN) 2.5 MG tablet Take 3 tablets (7.5 mg total) by mouth 2 (two) times daily for 3 days. ?Patient not taking: Reported on 10/16/2021 10/12/21 10/16/21  Janine Limbo, MD  ?fluPHENAZine decanoate (PROLIXIN) 25 MG/ML injection Inject 1 mL (25 mg total) into the muscle every 14 (fourteen) days. Administer every 14 days after  10-08-21. 10/08/21   Janine Limbo, MD  ?loratadine (CLARITIN) 10 MG tablet Take 1 tablet (10 mg total) by mouth daily. ?Patient taking differently: Take 10 mg by mouth daily as needed (for seasonal allergies). 10/13/21   Massengill, Ovid Curd, MD  ?OLANZapine (ZYPREXA) 10 MG tablet Take 1 tablet (10 mg total) by mouth 2 (two) times daily. ?Patient not taking: Reported on 10/16/2021 10/12/21 11/11/21  Janine Limbo, MD  ?simethicone (MYLICON) 852 MG chewable tablet Chew 125 mg by mouth every 6 (six) hours as needed for flatulence.    [provider]  ?   ? ?Allergies    ?Patient has no known allergies.   ? ?Review of Systems   ?Review of Systems  ?Musculoskeletal:   ?     Foot pain  ?All other systems reviewed and are negative. ? ?Physical Exam ?Updated Vital Signs ?BP (!) 134/95 (BP Location: Left Arm)   Pulse 91   Temp 98.8 ?F (37.1 ?C) (Oral)   Resp 15   Ht 5' (1.524 m)   Wt 59 kg   SpO2 100%   BMI 25.39 kg/m?  ?Physical Exam ?Vitals and nursing note reviewed.  ?Constitutional:   ?   Comments: Disheveled female  ?HENT:  ?   Head: Normocephalic and atraumatic.  ?   Comments: Partially healed lesion over bridge of nose ?Eyes:  ?   Conjunctiva/sclera: Conjunctivae normal.  ?Pulmonary:  ?   Effort: Pulmonary effort is normal. No respiratory distress.  ?Feet:  ?   Right foot:  ?  Toenail Condition: Right toenails are long.  ?   Left foot:  ?   Toenail Condition: Left toenails are long.  ?   Comments: Left ankle nonpitting edema. No deformities, contusions, abrasions, punctures/penetrations, or lacerations to bilateral feet. Multiple blisters noted to the toes and sole of the foot. No hematoma noted. Normal ROM of bilateral ankles and toes.  ?Skin: ?   General: Skin is warm and dry.  ?Neurological:  ?   Mental Status: She is alert.  ?Psychiatric:     ?   Mood and Affect: Mood normal.     ?   Behavior: Behavior normal.  ? ? ?ED Results / Procedures / Treatments   ?Labs ?(all labs ordered are listed, but  only abnormal results are displayed) ?Labs Reviewed - No data to display ? ?EKG ?None ? ?Radiology ?No results found. ? ?Procedures ?Procedures  ? ? ?Medications Ordered in ED ?Medications  ?ibuprofen (ADVIL) tablet 800 mg (800 mg Oral Given 11/03/21 2301)  ? ? ?ED Course/ Medical Decision Making/ A&P ?  ?                        ?Medical Decision Making ?Risk ?Prescription drug management. ?Risk Details: Homelessness ? ?This patient is a 51 year old female who presents to the ED for concern of foot pain.  ? ?Past Medical History / Co-morbidities / Social History: ?History of brain metastases, melanoma, psychosis, schizophrenia spectrum disorder with psychotic disorder type ? ?Additional history: ?Chart reviewed. Pertinent results include: Patient well-known to this facility.  Of note during her last visit she had changed her main complaint multiple times and has been seen at this facility for variety of reasons in the past several weeks. ? ?Physical Exam: ?Physical exam performed. The pertinent findings include: Normal vital signs.  Skin breakdown consistent with multiple blisters on the feet.  Normal range of motion of the ankles and the toes.  No focal tenderness.   ?  ?Disposition: ?After consideration of the diagnostic results and the patients response to treatment, I feel that patient's not requiring admission or inpatient treatment for symptoms.  Patient given ibuprofen for pain and given dry socks. Encouraged good foot hygiene.  ? ? ? ? ? ? ? ?Final Clinical Impression(s) / ED Diagnoses ?Final diagnoses:  ?Bilateral foot pain  ?Homelessness  ? ? ?Rx / DC Orders ?ED Discharge Orders   ? ? None  ? ?  ? ?Portions of this report may have been transcribed using voice recognition software. Every effort was made to ensure accuracy; however, inadvertent computerized transcription errors may be present. ? ?  ?Kateri Plummer, PA-C ?11/03/21 2335 ? ?  ?Lorelle Gibbs, DO ?11/04/21 0004 ? ?

## 2021-11-04 ENCOUNTER — Emergency Department (HOSPITAL_COMMUNITY)
Admission: EM | Admit: 2021-11-04 | Discharge: 2021-11-04 | Disposition: A | Discharge status: Home / Self Care | Payer: Medicare Other | Attending: Emergency Medicine | Admitting: Emergency Medicine

## 2021-11-04 ENCOUNTER — Telehealth: Payer: Self-pay | Admitting: Emergency Medicine

## 2021-11-04 ENCOUNTER — Encounter: Payer: Self-pay | Admitting: Oncology

## 2021-11-04 ENCOUNTER — Encounter (HOSPITAL_COMMUNITY): Payer: Self-pay | Admitting: Emergency Medicine

## 2021-11-04 ENCOUNTER — Emergency Department (HOSPITAL_COMMUNITY)
Admission: EM | Admit: 2021-11-04 | Discharge: 2021-11-05 | Disposition: A | Discharge status: Home / Self Care | Payer: Medicare Other | Source: Home / Self Care | Attending: Student | Admitting: Student

## 2021-11-04 ENCOUNTER — Other Ambulatory Visit: Payer: Self-pay

## 2021-11-04 ENCOUNTER — Other Ambulatory Visit (HOSPITAL_COMMUNITY): Payer: Self-pay

## 2021-11-04 ENCOUNTER — Emergency Department (HOSPITAL_COMMUNITY): Payer: Medicare Other

## 2021-11-04 DIAGNOSIS — M79672 Pain in left foot: Secondary | ICD-10-CM | POA: Diagnosis not present

## 2021-11-04 DIAGNOSIS — Z85820 Personal history of malignant melanoma of skin: Secondary | ICD-10-CM | POA: Insufficient documentation

## 2021-11-04 DIAGNOSIS — R0981 Nasal congestion: Secondary | ICD-10-CM | POA: Insufficient documentation

## 2021-11-04 DIAGNOSIS — W19XXXA Unspecified fall, initial encounter: Secondary | ICD-10-CM

## 2021-11-04 DIAGNOSIS — Z85841 Personal history of malignant neoplasm of brain: Secondary | ICD-10-CM | POA: Insufficient documentation

## 2021-11-04 DIAGNOSIS — M542 Cervicalgia: Secondary | ICD-10-CM | POA: Insufficient documentation

## 2021-11-04 DIAGNOSIS — Y9289 Other specified places as the place of occurrence of the external cause: Secondary | ICD-10-CM | POA: Insufficient documentation

## 2021-11-04 DIAGNOSIS — Z85858 Personal history of malignant neoplasm of other endocrine glands: Secondary | ICD-10-CM | POA: Insufficient documentation

## 2021-11-04 LAB — BASIC METABOLIC PANEL
Anion gap: 10 (ref 5–15)
BUN: 23 mg/dL — ABNORMAL HIGH (ref 6–20)
CO2: 23 mmol/L (ref 22–32)
Calcium: 9.1 mg/dL (ref 8.9–10.3)
Chloride: 106 mmol/L (ref 98–111)
Creatinine, Ser: 0.75 mg/dL (ref 0.44–1.00)
GFR, Estimated: >60 mL/min (ref >60)
Glucose, Bld: 76 mg/dL (ref 70–99)
Potassium: 3.9 mmol/L (ref 3.5–5.1)
Sodium: 139 mmol/L (ref 135–145)

## 2021-11-04 LAB — CBC WITH DIFFERENTIAL/PLATELET
Abs Immature Granulocytes: 0.01 10*3/uL (ref 0.00–0.07)
Basophils Absolute: 0 10*3/uL (ref 0.0–0.1)
Basophils Relative: 0 %
Eosinophils Absolute: 0 10*3/uL (ref 0.0–0.5)
Eosinophils Relative: 0 %
HCT: 36.1 % (ref 36.0–46.0)
Hemoglobin: 11.7 g/dL — ABNORMAL LOW (ref 12.0–15.0)
Immature Granulocytes: 0 %
Lymphocytes Relative: 24 %
Lymphs Abs: 1.2 10*3/uL (ref 0.7–4.0)
MCH: 28.3 pg (ref 26.0–34.0)
MCHC: 32.4 g/dL (ref 30.0–36.0)
MCV: 87.2 fL (ref 80.0–100.0)
Monocytes Absolute: 0.5 10*3/uL (ref 0.1–1.0)
Monocytes Relative: 10 %
Neutro Abs: 3.2 10*3/uL (ref 1.7–7.7)
Neutrophils Relative %: 66 %
Platelets: 79 10*3/uL — ABNORMAL LOW (ref 150–400)
RBC: 4.14 MIL/uL (ref 3.87–5.11)
RDW: 12.3 % (ref 11.5–15.5)
WBC: 4.8 10*3/uL (ref 4.0–10.5)
nRBC: 0 % (ref 0.0–0.2)

## 2021-11-04 MED ORDER — CYCLOBENZAPRINE HCL 10 MG PO TABS
10.0000 mg | ORAL_TABLET | Freq: Two times a day (BID) | ORAL | 0 refills | Status: DC | PRN
  Filled 2021-11-04: qty 20, 10d supply, fill #0

## 2021-11-04 NOTE — ED Provider Triage Note (Signed)
Emergency Medicine Provider Triage Evaluation Note ? ?Kaitlyn Duncan , a 51 y.o. female  was evaluated in triage.  Pt complains of headache/head pain secondary to a fall.  Patient states she fell multiple times today.  Endorses headache at this time.Questionable LOC. States "she believes she needs a whole workup because she thinks she is a fall risk". No obvious wound to head.  ? ?Review of Systems  ?Positive: Headache, falls, questionable LOC ?Negative: Chest pain ? ?Physical Exam  ?BP (!) 125/91 (BP Location: Right Arm)   Pulse 88   Temp 98.1 ?F (36.7 ?C) (Oral)   Resp 17   Ht 5' (1.524 m)   Wt 59 kg   SpO2 99%   BMI 25.39 kg/m?  ?Gen:   Awake, no distress   ?Resp:  Normal effort  ?MSK:   Moves extremities without difficulty  ?Other:   ? ?Medical Decision Making  ?Medically screening exam initiated at 8:20 PM.  Appropriate orders placed.  Carlyon Shadow Delmonico was informed that the remainder of the evaluation will be completed by another provider, this initial triage assessment does not replace that evaluation, and the importance of remaining in the ED until their evaluation is complete. ? ? ?  ?Dorothyann Peng, PA-C ?11/04/21 2021 ? ?

## 2021-11-04 NOTE — ED Triage Notes (Signed)
Pt BIB [facility] reports fall at [facility] today, denies LOC, v/s WNL, pt a&0 x 4 ?

## 2021-11-04 NOTE — ED Triage Notes (Signed)
Pt wants the "thing scraped off her nose" and both feet hurt.  Also hungry.  BIB EMS ?

## 2021-11-04 NOTE — Discharge Instructions (Addendum)
I have sent you in a muscle relaxer to help with some of your neck stiffness. Tylenol and motrin can also help with this along with your foot pain. Use mucinex to help with nasal congestion and feel better soon. Please leave the scab on your nose as this protects the wound from infection. It will clear up on its own. ?

## 2021-11-04 NOTE — ED Provider Triage Note (Signed)
Emergency Medicine Provider Triage Evaluation Note ? ?Kaitlyn Duncan , a 51 y.o. female  was evaluated in triage.  Pt complains of bilateral feet pain, lesion on her nose.  States she had a fall few weeks ago and this lesion has been present for the past 3 weeks.  She is requesting this to be removed.  She was evaluated at [facility] long earlier for feet pain.  Exam at that time was reassuring.  Of note patient has numerous visits to the emergency room for various complaints.  Patient is homeless.  No apparent injury or deformity noted to the knees.  Patient appears without acute distress on exam. ? ?Review of Systems  ?Positive: As above ?Negative: As above ? ?Physical Exam  ?BP 134/89 (BP Location: Right Arm)   Pulse 82   Temp 97.9 ?F (36.6 ?C) (Oral)   Resp 16   SpO2 99%  ?Gen:   Awake, no distress   ?Resp:  Normal effort  ?MSK:   Moves extremities without difficulty  ?Other:   ? ?Medical Decision Making  ?Medically screening exam initiated at 1:42 AM.  Appropriate orders placed.  Kaitlyn Duncan was informed that the remainder of the evaluation will be completed by another provider, this initial triage assessment does not replace that evaluation, and the importance of remaining in the ED until their evaluation is complete. ? ? ?  ?Kaitlyn Courier, PA-C ?11/04/21 0143 ? ?

## 2021-11-04 NOTE — Progress Notes (Signed)
ED Antimicrobial Stewardship Positive Culture Follow Up  ? ?Kaitlyn Duncan is an 51 y.o. female who presented to [facility] on 11/04/2021 with a chief complaint of  ?Chief Complaint  ?Patient presents with  ? Foot Pain  ? Nose pain  ? ? ?Recent Results (from the past 720 hour(s))  ?Resp Panel by RT-PCR (Flu A&B, Covid) Nasopharyngeal Swab     Status: None  ? Collection Time: 10/16/21  5:07 PM  ? Specimen: Nasopharyngeal Swab; Nasopharyngeal(NP) swabs in vial transport medium  ?Result Value Ref Range Status  ? SARS Coronavirus 2 by RT PCR NEGATIVE NEGATIVE Final  ?  Comment: (NOTE) ?SARS-CoV-2 target nucleic acids are NOT DETECTED. ? ?The SARS-CoV-2 RNA is generally detectable in upper respiratory ?specimens during the acute phase of infection. The lowest ?concentration of SARS-CoV-2 viral copies this assay can detect is ?138 copies/mL. A negative result does not preclude SARS-Cov-2 ?infection and should not be used as the sole basis for treatment or ?other patient management decisions. A negative result may occur with  ?improper specimen collection/handling, submission of specimen other ?than nasopharyngeal swab, presence of viral mutation(s) within the ?areas targeted by this assay, and inadequate number of viral ?copies(<138 copies/mL). A negative result must be combined with ?clinical observations, patient history, and epidemiological ?information. The expected result is Negative. ? ?Fact Sheet for Patients:  ?EntrepreneurPulse.com.au ? ?Fact Sheet for Healthcare Providers:  ?IncredibleEmployment.be ? ?This test is no t yet approved or cleared by the [place] FDA and  ?has been authorized for detection and/or diagnosis of SARS-CoV-2 by ?FDA under an Emergency Use Authorization (EUA). This EUA will remain  ?in effect (meaning this test can be used) for the duration of the ?COVID-19 declaration under Section 564(b)(1) of the Act, 21 ?U.S.C.section 360bbb-3(b)(1), unless  the authorization is terminated  ?or revoked sooner.  ? ? ?  ? Influenza A by PCR NEGATIVE NEGATIVE Final  ? Influenza B by PCR NEGATIVE NEGATIVE Final  ?  Comment: (NOTE) ?The Xpert Xpress SARS-CoV-2/FLU/RSV plus assay is intended as an aid ?in the diagnosis of influenza from Nasopharyngeal swab specimens and ?should not be used as a sole basis for treatment. Nasal washings and ?aspirates are unacceptable for Xpert Xpress SARS-CoV-2/FLU/RSV ?testing. ? ?Fact Sheet for Patients: ?EntrepreneurPulse.com.au ? ?Fact Sheet for Healthcare Providers: ?IncredibleEmployment.be ? ?This test is not yet approved or cleared by the [place] FDA and ?has been authorized for detection and/or diagnosis of SARS-CoV-2 by ?FDA under an Emergency Use Authorization (EUA). This EUA will remain ?in effect (meaning this test can be used) for the duration of the ?COVID-19 declaration under Section 564(b)(1) of the Act, 21 U.S.C. ?section 360bbb-3(b)(1), unless the authorization is terminated or ?revoked. ? ?Performed at [facility], [address] ?[place], [place] [address] ?  ?Resp Panel by RT-PCR (Flu A&B, Covid) Nasopharyngeal Swab     Status: None  ? Collection Time: 10/18/21 10:02 AM  ? Specimen: Nasopharyngeal Swab; Nasopharyngeal(NP) swabs in vial transport medium  ?Result Value Ref Range Status  ? SARS Coronavirus 2 by RT PCR NEGATIVE NEGATIVE Final  ?  Comment: (NOTE) ?SARS-CoV-2 target nucleic acids are NOT DETECTED. ? ?The SARS-CoV-2 RNA is generally detectable in upper respiratory ?specimens during the acute phase of infection. The lowest ?concentration of SARS-CoV-2 viral copies this assay can detect is ?138 copies/mL. A negative result does not preclude SARS-Cov-2 ?infection and should not be used as the sole basis for treatment or ?other patient management decisions. A negative  result may occur with  ?improper specimen collection/handling, submission of specimen  other ?than nasopharyngeal swab, presence of viral mutation(s) within the ?areas targeted by this assay, and inadequate number of viral ?copies(<138 copies/mL). A negative result must be combined with ?clinical observations, patient history, and epidemiological ?information. The expected result is Negative. ? ?Fact Sheet for Patients:  ?EntrepreneurPulse.com.au ? ?Fact Sheet for Healthcare Providers:  ?IncredibleEmployment.be ? ?This test is no t yet approved or cleared by the [place] FDA and  ?has been authorized for detection and/or diagnosis of SARS-CoV-2 by ?FDA under an Emergency Use Authorization (EUA). This EUA will remain  ?in effect (meaning this test can be used) for the duration of the ?COVID-19 declaration under Section 564(b)(1) of the Act, 21 ?U.S.C.section 360bbb-3(b)(1), unless the authorization is terminated  ?or revoked sooner.  ? ? ?  ? Influenza A by PCR NEGATIVE NEGATIVE Final  ? Influenza B by PCR NEGATIVE NEGATIVE Final  ?  Comment: (NOTE) ?The Xpert Xpress SARS-CoV-2/FLU/RSV plus assay is intended as an aid ?in the diagnosis of influenza from Nasopharyngeal swab specimens and ?should not be used as a sole basis for treatment. Nasal washings and ?aspirates are unacceptable for Xpert Xpress SARS-CoV-2/FLU/RSV ?testing. ? ?Fact Sheet for Patients: ?EntrepreneurPulse.com.au ? ?Fact Sheet for Healthcare Providers: ?IncredibleEmployment.be ? ?This test is not yet approved or cleared by the [place] FDA and ?has been authorized for detection and/or diagnosis of SARS-CoV-2 by ?FDA under an Emergency Use Authorization (EUA). This EUA will remain ?in effect (meaning this test can be used) for the duration of the ?COVID-19 declaration under Section 564(b)(1) of the Act, 21 U.S.C. ?section 360bbb-3(b)(1), unless the authorization is terminated or ?revoked. ? ?Performed at [facility], [address] ?[place], [place] [address] ?  ?Urine Culture     Status: Abnormal  ? Collection Time: 10/31/21 10:48 PM  ? Specimen: Urine, Clean Catch  ?Result Value Ref Range Status  ? Specimen Description   Final  ?  URINE, CLEAN CATCH ?Performed at [facility], [place] [address] [place], [place] [address] ?  ? Special Requests   Final  ?  NONE ?Performed at [facility], [place] [address], [place], [place] [address] ?  ? Culture 30,000 COLONIES/mL ENTEROCOCCUS FAECALIS (A)  Final  ? Report Status 11/03/2021 FINAL  Final  ? Organism ID, Bacteria ENTEROCOCCUS FAECALIS (A)  Final  ?    Susceptibility  ? Enterococcus faecalis - MIC*  ?  AMPICILLIN <=2 SENSITIVE Sensitive   ?  NITROFURANTOIN <=16 SENSITIVE Sensitive   ?  VANCOMYCIN 2 SENSITIVE Sensitive   ?  * 30,000 COLONIES/mL ENTEROCOCCUS FAECALIS  ? ?51 yo F presented with multiple complaints over the past week. Does not report any urinary symptoms the last couple times in the ED. Was discharged 4/26 on Keflex but prescription was not picked up. Urine cx shows 30k of Enterococcus. With low colony count and lack of urinary symptoms since that day, no treatment is needed at this time. ? ?ED Provider: Dene Gentry, MD ? ?Elenor Quinones, PharmD, BCPS, BCIDP ?Clinical Pharmacist ?11/04/2021 9:04 AM ? ? ? ?

## 2021-11-04 NOTE — Telephone Encounter (Signed)
Post ED Visit - Positive Culture Follow-up ? ?Culture report reviewed by antimicrobial stewardship pharmacist: ?[place] Team ?'[x]'$  Elenor Quinones, Pharm.D. ?'[]'$  Heide Guile, Pharm.D., BCPS AQ-ID ?'[]'$  Parks Neptune, Pharm.D., BCPS ?'[]'$  Alycia Rossetti, Pharm.D., BCPS ?'[]'$  Douglas, Pharm.D., BCPS, AAHIVP ?'[]'$  Legrand Como, Pharm.D., BCPS, AAHIVP ?'[]'$  Salome Arnt, PharmD, BCPS ?'[]'$  Johnnette Gourd, PharmD, BCPS ?'[]'$  Hughes Better, PharmD, BCPS ?'[]'$  Leeroy Cha, PharmD ?'[]'$  Laqueta Linden, PharmD, BCPS ?'[]'$  Albertina Parr, PharmD ? ?[place] Team ?'[]'$  Leodis Sias, PharmD ?'[]'$  Lindell Spar, PharmD ?'[]'$  Royetta Asal, PharmD ?'[]'$  Graylin Shiver, Rph ?'[]'$  Rema Fendt) Glennon Mac, PharmD ?'[]'$  Arlyn Dunning, PharmD ?'[]'$  Netta Cedars, PharmD ?'[]'$  Dia Sitter, PharmD ?'[]'$  Leone Haven, PharmD ?'[]'$  Gretta Arab, PharmD ?'[]'$  Theodis Shove, PharmD ?'[]'$  Peggyann Juba, PharmD ?'[]'$  Reuel Boom, PharmD ? ? ?Positive urine culture ?Treated with none, asymptomatic,no further patient follow-up is required at this time. ? ?Hazle Nordmann ?11/04/2021, 10:38 AM ?  ?

## 2021-11-04 NOTE — ED Provider Notes (Signed)
?[facility] ?Provider Note ? ? ?CSN: [id number] ?Arrival date & time: 11/04/21  0130 ? ?  ? ?History ? ?Chief Complaint  ?Patient presents with  ? Foot Pain  ? Nose pain  ? ? ?Kaitlyn Duncan is a 51 y.o. female with history of melanoma, psychosis, schizophrenia who presents to the ED for evaluation of left-sided foot pain, neck pain and a scab on the bridge of her nose. Patient has been here numerous times for similar complaints without any acute findings.  Patient requesting the scab on her nose to be removed.  She is ambulatory, walking without difficulty.  States her neck pain feels "stiff".  Also endorses some nasal congestion but denies headache, shortness of breath, chest pain, fevers and chills.  Patient denies all other complaints. ? ? ?Foot Pain ? ? ?  ? ?Home Medications ?Prior to Admission medications   ?Medication Sig Start Date End Date Taking? Authorizing Provider  ?cyclobenzaprine (FLEXERIL) 10 MG tablet Take 1 tablet  by mouth 2  times daily as needed for muscle spasms. 11/04/21  Yes Kathe Becton R, PA-C  ?acetaminophen (TYLENOL) 500 MG tablet Take 500-1,000 mg by mouth every 6 (six) hours as needed for mild pain or headache.    [provider]  ?bacitracin ointment Apply 1 application. topically 2 (two) times daily. 10/19/21   Sherwood Gambler, MD  ?cephALEXin (KEFLEX) 500 MG capsule Take 2 capsules (1,000 mg total) by mouth 2 (two) times daily. 10/29/21   Lajean Saver, MD  ?cetaphil (CETAPHIL) lotion Apply topically daily. ?Patient not taking: Reported on 10/16/2021 10/13/21   Janine Limbo, MD  ?clotrimazole (LOTRIMIN) 1 % cream Apply to affected area 2 times daily 10/29/21   Lajean Saver, MD  ?docusate sodium (COLACE) 100 MG capsule Take 100-200 mg by mouth daily as needed for mild constipation.    [provider]  ?fluPHENAZine (PROLIXIN) 2.5 MG tablet Take 3 tablets (7.5 mg total) by mouth 2 (two) times daily for 3 days. ?Patient  not taking: Reported on 10/16/2021 10/12/21 10/16/21  Janine Limbo, MD  ?fluPHENAZine decanoate (PROLIXIN) 25 MG/ML injection Inject 1 mL (25 mg total) into the muscle every 14 (fourteen) days. Administer every 14 days after 10-08-21. 10/08/21   Janine Limbo, MD  ?loratadine (CLARITIN) 10 MG tablet Take 1 tablet (10 mg total) by mouth daily. ?Patient taking differently: Take 10 mg by mouth daily as needed (for seasonal allergies). 10/13/21   Massengill, Ovid Curd, MD  ?OLANZapine (ZYPREXA) 10 MG tablet Take 1 tablet (10 mg total) by mouth 2 (two) times daily. ?Patient not taking: Reported on 10/16/2021 10/12/21 11/11/21  Janine Limbo, MD  ?simethicone (MYLICON) 093 MG chewable tablet Chew 125 mg by mouth every 6 (six) hours as needed for flatulence.    [provider]  ?   ? ?Allergies    ?Patient has no known allergies.   ? ?Review of Systems   ?Review of Systems ? ?Physical Exam ?Updated Vital Signs ?BP 134/89 (BP Location: Right Arm)   Pulse 82   Temp 97.9 ?F (36.6 ?C) (Oral)   Resp 16   SpO2 99%  ?Physical Exam ?Vitals and nursing note reviewed.  ?Constitutional:   ?   General: She is not in acute distress. ?   Appearance: She is not ill-appearing.  ?HENT:  ?   Head: Atraumatic.  ?Eyes:  ?   Conjunctiva/sclera: Conjunctivae normal.  ?Neck:  ?   Comments: Negative midline tenderness.  Range of motion slightly  limited secondary to pain.  No paraspinal muscle tenderness. ?Cardiovascular:  ?   Rate and Rhythm: Normal rate and regular rhythm.  ?   Pulses: Normal pulses.  ?   Heart sounds: No murmur heard. ?Pulmonary:  ?   Effort: Pulmonary effort is normal. No respiratory distress.  ?   Breath sounds: Normal breath sounds.  ?Abdominal:  ?   General: Abdomen is flat. There is no distension.  ?   Palpations: Abdomen is soft.  ?   Tenderness: There is no abdominal tenderness.  ?Musculoskeletal:     ?   General: Normal range of motion.  ?   Cervical back: Normal range of motion.  ?   Comments: Bilateral  feet, ankles and knees without acute deformity, swelling, bruising.  Nontender to palpation.  Full range of motion.  2+ distal pulses bilaterally  ?Skin: ?   General: Skin is warm and dry.  ?   Capillary Refill: Capillary refill takes less than 2 seconds.  ?   Comments: Healing thick scab over the bridge of the nose, no erythema, drainage or signs of infection  ?Neurological:  ?   General: No focal deficit present.  ?   Mental Status: She is alert.  ?Psychiatric:     ?   Mood and Affect: Mood normal.  ? ? ?ED Results / Procedures / Treatments   ?Labs ?(all labs ordered are listed, but only abnormal results are displayed) ?Labs Reviewed - No data to display ? ?EKG ?None ? ?Radiology ?No results found. ? ?Procedures ?Procedures  ? ? ?Medications Ordered in ED ?Medications - No data to display ? ?ED Course/ Medical Decision Making/ A&P ?  ?                        ?Medical Decision Making ?Risk ?Prescription drug management. ? ? ?51 year old female in no acute distress, nontoxic-appearing presents to the ED for evaluation of multiple complaints.  Vitals without acute abnormalities.  Physical exam significant for healing lesion on the bridge of her nose from injury several weeks ago.  No signs of infection.  Patient was requesting this removed, however advised to leave scab on in order to prevent infection and worsening scar.  Assured that it should heal on its own given some time.  He is complaining of left foot pain and neck pain as well.  No acute findings, no deformities.  Patient is ambulatory.  No meningeal signs.  Patient has some nasal congestion but is satting at 99% on room air.  Advised to take Mucinex or over-the-counter cold and flu medications for symptoms.  Patient expresses understanding and discharged home in good condition. ? ? ?Final Clinical Impression(s) / ED Diagnoses ?Final diagnoses:  ?Neck pain  ?Nasal congestion  ?Foot pain, left  ? ? ?Rx / DC Orders ?ED Discharge Orders   ? ?      Ordered  ?   cyclobenzaprine (FLEXERIL) 10 MG tablet  2 times daily PRN       ? 11/04/21 0830  ? ?  ?  ? ?  ? ? ?  ?Tonye Pearson, [place] ?11/04/21 2841 ? ?  ?Blanchie Dessert, MD ?11/04/21 1341 ? ?

## 2021-11-04 NOTE — ED Notes (Signed)
Pt reports and earlier fall today on a bus, reports she is concerned that her shoes are contributing to her being a fall risk  ?

## 2021-11-05 ENCOUNTER — Emergency Department (HOSPITAL_COMMUNITY): Payer: Medicare Other

## 2021-11-05 DIAGNOSIS — M79672 Pain in left foot: Secondary | ICD-10-CM | POA: Diagnosis not present

## 2021-11-05 MED ORDER — NAPROXEN 250 MG PO TABS
500.0000 mg | ORAL_TABLET | Freq: Once | ORAL | Status: AC
  Administered 2021-11-05: 500 mg via ORAL
  Filled 2021-11-05: qty 2

## 2021-11-05 MED ORDER — LIDOCAINE 5 % EX PTCH
1.0000 | MEDICATED_PATCH | CUTANEOUS | Status: DC
  Administered 2021-11-05: 1 via TRANSDERMAL
  Filled 2021-11-05: qty 1

## 2021-11-05 NOTE — Progress Notes (Signed)
Patients guardian is now trying to secure placement at a hotel for patient. ?

## 2021-11-05 NOTE — Progress Notes (Signed)
CSW contacted patients legal guardian with [facility] [employer], [name] at [phone]. CSW left a VM and sent a text at 11. A read receipt was received that the text message was read at 11:31 AM. Legal guardian did not respond to phone call or text message. Patient can get a bus pass or taxi voucher. Vm also legal with Adrienne's supervisor Louann Liv [phone] ?

## 2021-11-05 NOTE — ED Notes (Signed)
When attempting to discharge pt she asked for a hotel voucher from her legal guardian. I then called legal guardian and did not get an answer. Unable to leave voicemail. Charge RN notified and advised to get CSW to reach out.  ?

## 2021-11-05 NOTE — ED Provider Notes (Signed)
?[facility] ?Provider Note ? ?CSN: [id number] ?Arrival date & time: 11/04/21 1958 ? ?Chief Complaint(s) ?Fall ? ?HPI ?Kaitlyn Duncan is a 51 y.o. female with PMH metastatic melanoma with mets to the brain status post treatment, GERD, anemia who presents emergency department for evaluation of a fall.  Patient states that she was outside of [employer] and fell onto the concrete.She denies numbness, tingling, weakness or other neurologic complaints.  She endorses neck pain.  Denies chest pain, shortness of breath, abdominal pain, nausea, vomiting or other systemic symptoms. ? ? ?Past Medical History ?Past Medical History:  ?Diagnosis Date  ? Allergy   ? Anemia   ? Gallstones   ? GERD (gastroesophageal reflux disease)   ? Hemorrhoids   ? melanoma with met dz dx'd 01/2018  ? brain and adrenal  ? Seasonal allergies   ? ?Patient Active Problem List  ? Diagnosis Date Noted  ? Schizophrenia ([place]) 09/25/2021  ? Leukopenia 06/27/2021  ? Thrombocytopenia ([place]) 06/27/2021  ? Schizophrenia spectrum disorder with psychotic disorder type not yet determined ([place]) 06/26/2021  ? Has difficulty accessing primary care provider for most visits 06/15/2021  ? Malingering 01/23/2021  ? Candidiasis of skin 01/23/2021  ? Musculoskeletal pain 01/23/2021  ? Psychosis ([place])   ? 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 Melanoma of skin ([place]) 05/29/2018  ? Goals of care, counseling/discussion 05/29/2018  ? Brain metastasis 01/26/2018  ? ?Home Medication(s) ?Prior to Admission medications   ?Medication Sig Start Date End Date Taking? Authorizing Provider  ?acetaminophen (TYLENOL) 500 MG tablet Take 500-1,000 mg by mouth every 6 (six) hours as needed for mild pain or headache.    [provider]  ?bacitracin ointment Apply 1 application. topically 2 (two) times daily. 10/19/21   Sherwood Gambler, MD  ?cephALEXin (KEFLEX) 500 MG capsule Take 2 capsules  (1,000 mg total) by mouth 2 (two) times daily. 10/29/21   Lajean Saver, MD  ?cetaphil (CETAPHIL) lotion Apply topically daily. ?Patient not taking: Reported on 10/16/2021 10/13/21   Janine Limbo, MD  ?clotrimazole (LOTRIMIN) 1 % cream Apply to affected area 2 times daily 10/29/21   Lajean Saver, MD  ?cyclobenzaprine (FLEXERIL) 10 MG tablet Take 1 tablet by mouth 2 times daily as needed for muscle spasms. 11/04/21   Tonye Pearson, PA-C  ?docusate sodium (COLACE) 100 MG capsule Take 100-200 mg by mouth daily as needed for mild constipation.    [provider]  ?fluPHENAZine (PROLIXIN) 2.5 MG tablet Take 3 tablets (7.5 mg total) by mouth 2 (two) times daily for 3 days. ?Patient not taking: Reported on 10/16/2021 10/12/21 10/16/21  Janine Limbo, MD  ?fluPHENAZine decanoate (PROLIXIN) 25 MG/ML injection Inject 1 mL (25 mg total) into the muscle every 14 (fourteen) days. Administer every 14 days after 10-08-21. 10/08/21   Janine Limbo, MD  ?loratadine (CLARITIN) 10 MG tablet Take 1 tablet (10 mg total) by mouth daily. ?Patient taking differently: Take 10 mg by mouth daily as needed (for seasonal allergies). 10/13/21   Massengill, Ovid Curd, MD  ?OLANZapine (ZYPREXA) 10 MG tablet Take 1 tablet (10 mg total) by mouth 2 (two) times daily. ?Patient not taking: Reported on 10/16/2021 10/12/21 11/11/21  Janine Limbo, MD  ?simethicone (MYLICON) 390 MG chewable tablet Chew 125 mg by mouth every 6 (six) hours as needed for flatulence.    [provider]  ?                                                                                                                                  ?  Past Surgical History ?Past Surgical History:  ?Procedure Laterality Date  ? BIOPSY  01/28/2018  ? Procedure: BIOPSY;  Surgeon: Laurence Spates, MD;  Location: [facility] ENDOSCOPY;  Service: Endoscopy;;  ? BREAST BIOPSY Left   ? ESOPHAGOGASTRODUODENOSCOPY N/A 01/28/2018  ? Procedure: ESOPHAGOGASTRODUODENOSCOPY (EGD);  Surgeon:  Laurence Spates, MD;  Location: [facility];  Service: Endoscopy;  Laterality: N/A;  ? MOUTH SURGERY    ? OVARY SURGERY    ? removed "something"  ? ?Family History ?Family History  ?Problem Relation Age of Onset  ? Melanoma Mother   ? Hypertension Father   ? Breast cancer Maternal Aunt   ? Breast cancer Paternal Aunt   ? Diabetes Paternal Aunt   ? Breast cancer Maternal Grandmother   ? Breast cancer Paternal Grandmother   ? Diabetes Paternal Grandmother   ? Colon cancer Neg Hx   ? Esophageal cancer Neg Hx   ? Pancreatic cancer Neg Hx   ? Stomach cancer Neg Hx   ? Liver disease Neg Hx   ? Rectal cancer Neg Hx   ? ? ?Social History ?Social History  ? ?Tobacco Use  ? Smoking status: Never  ? Smokeless tobacco: Never  ?Vaping Use  ? Vaping Use: Never used  ?Substance Use Topics  ? Alcohol use: No  ? Drug use: No  ? ?Allergies ?Patient has no known allergies. ? ?Review of Systems ?Review of Systems  ?Musculoskeletal:  Positive for neck pain.  ? ?Physical Exam ?Vital Signs  ?I have reviewed the triage vital signs ?BP 127/76 (BP Location: Left Arm)   Pulse 70   Temp 98.6 ?F (37 ?C) (Oral)   Resp 16   Ht 5' (1.524 m)   Wt 59 kg   SpO2 98%   BMI 25.39 kg/m?  ? ?Physical Exam ?Vitals and nursing note reviewed.  ?Constitutional:   ?   General: She is not in acute distress. ?   Appearance: She is well-developed.  ?HENT:  ?   Head: Normocephalic and atraumatic.  ?Eyes:  ?   Conjunctiva/sclera: Conjunctivae normal.  ?Cardiovascular:  ?   Rate and Rhythm: Normal rate and regular rhythm.  ?   Heart sounds: No murmur heard. ?Pulmonary:  ?   Effort: Pulmonary effort is normal. No respiratory distress.  ?   Breath sounds: Normal breath sounds.  ?Abdominal:  ?   Palpations: Abdomen is soft.  ?   Tenderness: There is no abdominal tenderness.  ?Musculoskeletal:     ?   General: No swelling.  ?   Cervical back: Neck supple. Tenderness present.  ?Skin: ?   General: Skin is warm and dry.  ?   Capillary Refill: Capillary refill takes  less than 2 seconds.  ?Neurological:  ?   Mental Status: She is alert.  ?Psychiatric:     ?   Mood and Affect: Mood normal.  ? ? ?ED Results and Treatments ?Labs ?(all labs ordered are listed, but only abnormal results are displayed) ?Labs Reviewed  ?BASIC METABOLIC PANEL - Abnormal; Notable for the following components:  ?    Result Value  ? BUN 23 (*)   ? All other components within normal limits  ?CBC WITH DIFFERENTIAL/PLATELET - Abnormal; Notable for the following components:  ? Hemoglobin 11.7 (*)   ? Platelets 79 (*)   ? All other components within normal limits  ?                                                                                                                       ? ?  Radiology ?CT Head Wo Contrast ? ?Result Date: 11/04/2021 ?CLINICAL DATA:  Trauma. EXAM: CT HEAD WITHOUT CONTRAST TECHNIQUE: Contiguous axial images were obtained from the base of the skull through the vertex without intravenous contrast. RADIATION DOSE REDUCTION: This exam was performed according to the departmental dose-optimization program which includes automated exposure control, adjustment of the mA and/or kV according to patient size and/or use of iterative reconstruction technique. COMPARISON:  Head CT dated 10/17/2021. FINDINGS: Brain: Mild age-related atrophy and moderate chronic microvascular ischemic changes. There is no acute intracranial hemorrhage. No mass effect or midline shift. No extra-axial fluid collection. Vascular: No hyperdense vessel or unexpected calcification. Skull: Normal. Negative for fracture or focal lesion. Sinuses/Orbits: There is opacification the left maxillary sinus. No air-fluid level. Partial opacification of the left mastoid air cells. The remainder of the visualized paranasal sinuses and the right mastoid air cells are clear. Other: Apparent laceration of the skin over the nose. IMPRESSION: 1. No acute intracranial pathology. 2. Age-related atrophy and chronic microvascular ischemic changes.  Electronically Signed   By: Anner Crete M.D.   On: 11/04/2021 21:57   ? ?Pertinent labs & imaging results that were available during my care of the patient were reviewed by me and considered in my medical decision

## 2021-11-05 NOTE — ED Notes (Signed)
Discussed pt's discharge with legal guardian who requested placement assistance. MD and CSW notified. Per conversations, unable to board in ED for group home placement as pt would not qualify for SNF. Discussed this with pt's legal guardian who became upset that we could not assist with placement. Legal guardian then stated we needed to call ACT team to assist with medications. Pt not displaying mental health crisis. A+Ox4, at baseline. Legal guardian became upset that we were not going to hold patient longer and that she would be discharged. She then abruptly ended the call.  ?

## 2021-12-08 ENCOUNTER — Other Ambulatory Visit: Payer: Self-pay | Admitting: Nurse Practitioner

## 2021-12-10 ENCOUNTER — Telehealth: Payer: Self-pay | Admitting: Oncology

## 2021-12-10 NOTE — Telephone Encounter (Signed)
Rescheduled July appointment due to provider pal, called patient regarding new upcoming appointment. Voicemail was left.

## 2021-12-24 ENCOUNTER — Ambulatory Visit (INDEPENDENT_AMBULATORY_CARE_PROVIDER_SITE_OTHER): Payer: Medicare Other | Admitting: Family Medicine

## 2021-12-24 ENCOUNTER — Encounter: Payer: Self-pay | Admitting: Family Medicine

## 2021-12-24 VITALS — BP 126/86 | HR 72 | Temp 97.6°F | Ht 60.0 in | Wt 133.0 lb

## 2021-12-24 DIAGNOSIS — F200 Paranoid schizophrenia: Secondary | ICD-10-CM | POA: Diagnosis not present

## 2021-12-24 DIAGNOSIS — D696 Thrombocytopenia, unspecified: Secondary | ICD-10-CM | POA: Diagnosis not present

## 2021-12-24 DIAGNOSIS — C439 Malignant melanoma of skin, unspecified: Secondary | ICD-10-CM

## 2021-12-24 DIAGNOSIS — C7931 Secondary malignant neoplasm of brain: Secondary | ICD-10-CM

## 2021-12-24 DIAGNOSIS — R350 Frequency of micturition: Secondary | ICD-10-CM | POA: Diagnosis not present

## 2021-12-24 DIAGNOSIS — R7303 Prediabetes: Secondary | ICD-10-CM | POA: Diagnosis not present

## 2021-12-24 LAB — COMPREHENSIVE METABOLIC PANEL
ALT: 14 U/L (ref 0–35)
AST: 22 U/L (ref 0–37)
Albumin: 4.7 g/dL (ref 3.5–5.2)
Alkaline Phosphatase: 96 U/L (ref 39–117)
BUN: 23 mg/dL (ref 6–23)
CO2: 31 mEq/L (ref 19–32)
Calcium: 9.7 mg/dL (ref 8.4–10.5)
Chloride: 102 mEq/L (ref 96–112)
Creatinine, Ser: 0.65 mg/dL (ref 0.40–1.20)
GFR: 100.73 mL/min (ref >60.00)
Glucose, Bld: 94 mg/dL (ref 70–99)
Potassium: 3.7 mEq/L (ref 3.5–5.1)
Sodium: 138 mEq/L (ref 135–145)
Total Bilirubin: 0.5 mg/dL (ref 0.2–1.2)
Total Protein: 7.9 g/dL (ref 6.0–8.3)

## 2021-12-24 LAB — CBC WITH DIFFERENTIAL/PLATELET
Basophils Absolute: 0 10*3/uL (ref 0.0–0.1)
Basophils Relative: 0.2 % (ref 0.0–3.0)
Eosinophils Absolute: 0 10*3/uL (ref 0.0–0.7)
Eosinophils Relative: 0 % (ref 0.0–5.0)
HCT: 40.5 % (ref 36.0–46.0)
Hemoglobin: 13.5 g/dL (ref 12.0–15.0)
Lymphocytes Relative: 26.7 % (ref 12.0–46.0)
Lymphs Abs: 1.1 10*3/uL (ref 0.7–4.0)
MCHC: 33.3 g/dL (ref 30.0–36.0)
MCV: 82.8 fl (ref 78.0–100.0)
Monocytes Absolute: 0.4 10*3/uL (ref 0.1–1.0)
Monocytes Relative: 10.9 % (ref 3.0–12.0)
Neutro Abs: 2.5 10*3/uL (ref 1.4–7.7)
Neutrophils Relative %: 62.2 % (ref 43.0–77.0)
Platelets: 64 10*3/uL — ABNORMAL LOW (ref 150.0–400.0)
RBC: 4.89 Mil/uL (ref 3.87–5.11)
RDW: 13 % (ref 11.5–15.5)
WBC: 4 10*3/uL (ref 4.0–10.5)

## 2021-12-24 LAB — POCT URINALYSIS DIPSTICK
Bilirubin, UA: NEGATIVE
Blood, UA: NEGATIVE
Glucose, UA: NEGATIVE
Ketones, UA: NEGATIVE
Leukocytes, UA: NEGATIVE
Nitrite, UA: NEGATIVE
Protein, UA: POSITIVE — AB
Spec Grav, UA: >=1.03 — AB (ref 1.010–1.025)
Urobilinogen, UA: 0.2 E.U./dL
pH, UA: 6 (ref 5.0–8.0)

## 2021-12-24 LAB — HEMOGLOBIN A1C: Hgb A1c MFr Bld: 5.4 % (ref 4.6–6.5)

## 2021-12-24 NOTE — Progress Notes (Unsigned)
New Patient Office Visit  Subjective    Patient ID: Kaitlyn Duncan, female    DOB: 19-Oct-1970  Age: 51 y.o. MRN: [id number]  CC:  Chief Complaint  Patient presents with   Establish Care    Has sore on her tongue for a while now and would like to have it looked at.  Spot on her nose for about 3 months now, has been using triple antibiotic cream.    Urinary Frequency    Running to bathroom every hour    HPI Kaitlyn Duncan presents to establish care.   Oncologist Psychiatrist GI   Requests a referral to a female dermatologist. Hx of skin melanoma on her back.   Complains of urinary frequency, urgency, urine leakage with cough or sneeze.   No dysuria,   Lives alone. Extended stay.  Disabled.  Family lives in [place]  [name]     Outpatient Encounter Medications as of 12/24/2021  Medication Sig   acetaminophen (TYLENOL) 500 MG tablet Take 500-1,000 mg by mouth every 6 (six) hours as needed for mild pain or headache.   Docusate Sodium (STOOL SOFTENER LAXATIVE PO) Take by mouth daily.   clotrimazole (LOTRIMIN) 1 % cream Apply to affected area 2 times daily (Patient not taking: Reported on 11/05/2021)   [DISCONTINUED] bacitracin ointment Apply 1 application. topically 2 (two) times daily. (Patient not taking: Reported on 11/05/2021)   [DISCONTINUED] cephALEXin (KEFLEX) 500 MG capsule Take 2 capsules (1,000 mg total) by mouth 2 (two) times daily. (Patient not taking: Reported on 11/05/2021)   [DISCONTINUED] cetaphil (CETAPHIL) lotion Apply topically daily. (Patient not taking: Reported on 10/16/2021)   [DISCONTINUED] cyclobenzaprine (FLEXERIL) 10 MG tablet Take 1 tablet by mouth 2 times daily as needed for muscle spasms. (Patient not taking: Reported on 11/05/2021)   [DISCONTINUED] docusate sodium (COLACE) 100 MG capsule Take 100-200 mg by mouth daily as needed for mild constipation. (Patient not taking: Reported on 11/05/2021)   [DISCONTINUED] fluPHENAZine (PROLIXIN) 2.5 MG tablet  Take 3 tablets (7.5 mg total) by mouth 2 (two) times daily for 3 days. (Patient not taking: Reported on 10/16/2021)   [DISCONTINUED] fluPHENAZine decanoate (PROLIXIN) 25 MG/ML injection Inject 1 mL (25 mg total) into the muscle every 14 (fourteen) days. Administer every 14 days after 10-08-21.   [DISCONTINUED] loratadine (CLARITIN) 10 MG tablet Take 1 tablet (10 mg total) by mouth daily. (Patient not taking: Reported on 11/05/2021)   [DISCONTINUED] OLANZapine (ZYPREXA) 10 MG tablet Take 1 tablet (10 mg total) by mouth 2 (two) times daily. (Patient not taking: Reported on 11/05/2021)   [DISCONTINUED] simethicone (MYLICON) 322 MG chewable tablet Chew 125 mg by mouth every 6 (six) hours as needed for flatulence. (Patient not taking: Reported on 11/05/2021)   No facility-administered encounter medications on file as of 12/24/2021.    Past Medical History:  Diagnosis Date   Allergy    Anemia    Gallstones    GERD (gastroesophageal reflux disease)    Hemorrhoids    melanoma with met dz dx'd 01/2018   brain and adrenal   Seasonal allergies     Past Surgical History:  Procedure Laterality Date   BIOPSY  01/28/2018   Procedure: BIOPSY;  Surgeon: Laurence Spates, MD;  Location: [facility];  Service: Endoscopy;;   BREAST BIOPSY Left    ESOPHAGOGASTRODUODENOSCOPY N/A 01/28/2018   Procedure: ESOPHAGOGASTRODUODENOSCOPY (EGD);  Surgeon: Laurence Spates, MD;  Location: [facility];  Service: Endoscopy;  Laterality: N/A;   MOUTH SURGERY  OVARY SURGERY     removed "something"    Family History  Problem Relation Age of Onset   Melanoma Mother    Hypertension Father    Breast cancer Maternal Aunt    Breast cancer Paternal Aunt    Diabetes Paternal Aunt    Breast cancer Maternal Grandmother    Breast cancer Paternal Grandmother    Diabetes Paternal Grandmother    Colon cancer Neg Hx    Esophageal cancer Neg Hx    Pancreatic cancer Neg Hx    Stomach cancer Neg Hx    Liver disease Neg Hx    Rectal  cancer Neg Hx     Social History   Socioeconomic History   Marital status: Single    Spouse name: Not on file   Number of children: Not on file   Years of education: Not on file   Highest education level: Not on file  Occupational History   Not on file  Tobacco Use   Smoking status: Never   Smokeless tobacco: Never  Vaping Use   Vaping Use: Never used  Substance and Sexual Activity   Alcohol use: No   Drug use: No   Sexual activity: Not Currently  Other Topics Concern   Not on file  Social History Narrative   Not on file   Social Determinants of Health   Financial Resource Strain: Not on file  Food Insecurity: Not on file  Transportation Needs: Not on file  Physical Activity: Not on file  Stress: Not on file  Social Connections: Not on file  Intimate Partner Violence: Not At Risk (03/30/2018)   Humiliation, Afraid, Rape, and Kick questionnaire    Fear of Current or Ex-Partner: No    Emotionally Abused: No    Physically Abused: No    Sexually Abused: No    ROS      Objective    BP 126/86 (BP Location: Left Arm, Patient Position: Sitting, Cuff Size: Large)   Pulse 72   Temp 97.6 F (36.4 C) (Temporal)   Ht 5' (1.524 m)   Wt 133 lb (60.3 kg)   SpO2 99%   BMI 25.97 kg/m   Physical Exam  {Labs (Optional):23779}    Assessment & Plan:   Problem List Items Addressed This Visit       Nervous and Auditory   Malignant neoplasm metastatic to brain [facility])     Musculoskeletal and Integument   Melanoma of skin (HCC)     Hematopoietic and Hemostatic   Thrombocytopenia ([place])   Relevant Orders   CBC with Differential/Platelet     Other   Frequent urination - Primary   Relevant Orders   POCT urinalysis dipstick (Completed)   Urine Culture   Prediabetes   Relevant Orders   CBC with Differential/Platelet   Comprehensive metabolic panel   Hemoglobin A1c   Schizophrenia ([place])    Return if symptoms worsen or fail to improve.   Kaitlyn Dingwall,  NP-C

## 2021-12-24 NOTE — Patient Instructions (Addendum)
Go to the lab on the first floor for blood work.   No sign of a urinary tract infection today. Continue staying well hydrated and follow up if your symptoms return.   We will be in touch with your results.   You may call and schedule with a dermatologist. Ask to see a female if that is your preference.  Let them know about your history of melanoma.   Dermatology offices  [facility]: Phone #: [phone] Address: [address], [place], [place] [address]  [facility]: Phone: [phone]  Address: [address], [place], [place] [address]  [facility]: [phone] Address: [address] [place], [place] [address]  [facility] Address: [place], [place], [place] [address] Phone: [phone]

## 2021-12-25 LAB — URINE CULTURE

## 2021-12-31 ENCOUNTER — Other Ambulatory Visit: Payer: Self-pay

## 2021-12-31 ENCOUNTER — Other Ambulatory Visit (HOSPITAL_COMMUNITY): Payer: Self-pay

## 2021-12-31 ENCOUNTER — Encounter (HOSPITAL_BASED_OUTPATIENT_CLINIC_OR_DEPARTMENT_OTHER): Payer: Self-pay | Admitting: Emergency Medicine

## 2021-12-31 ENCOUNTER — Emergency Department (HOSPITAL_BASED_OUTPATIENT_CLINIC_OR_DEPARTMENT_OTHER)
Admission: EM | Admit: 2021-12-31 | Discharge: 2021-12-31 | Disposition: A | Discharge status: Home / Self Care | Payer: Medicare Other | Attending: Emergency Medicine | Admitting: Emergency Medicine

## 2021-12-31 ENCOUNTER — Emergency Department (HOSPITAL_BASED_OUTPATIENT_CLINIC_OR_DEPARTMENT_OTHER): Payer: Medicare Other

## 2021-12-31 DIAGNOSIS — M79643 Pain in unspecified hand: Secondary | ICD-10-CM | POA: Diagnosis not present

## 2021-12-31 DIAGNOSIS — W57XXXA Bitten or stung by nonvenomous insect and other nonvenomous arthropods, initial encounter: Secondary | ICD-10-CM | POA: Diagnosis not present

## 2021-12-31 DIAGNOSIS — S60561A Insect bite (nonvenomous) of right hand, initial encounter: Secondary | ICD-10-CM | POA: Insufficient documentation

## 2021-12-31 DIAGNOSIS — R609 Edema, unspecified: Secondary | ICD-10-CM | POA: Diagnosis not present

## 2021-12-31 DIAGNOSIS — M79641 Pain in right hand: Secondary | ICD-10-CM | POA: Diagnosis not present

## 2021-12-31 DIAGNOSIS — T63484A Toxic effect of venom of other arthropod, undetermined, initial encounter: Secondary | ICD-10-CM | POA: Diagnosis not present

## 2021-12-31 MED ORDER — IBUPROFEN 800 MG PO TABS
800.0000 mg | ORAL_TABLET | Freq: Once | ORAL | Status: AC
  Administered 2021-12-31: 800 mg via ORAL
  Filled 2021-12-31: qty 1

## 2021-12-31 MED ORDER — FAMOTIDINE 20 MG PO TABS
20.0000 mg | ORAL_TABLET | Freq: Once | ORAL | Status: AC
  Administered 2021-12-31: 20 mg via ORAL
  Filled 2021-12-31: qty 1

## 2021-12-31 MED ORDER — DIPHENHYDRAMINE HCL 25 MG PO CAPS: 25.0000 mg | ORAL_CAPSULE | Freq: Once | ORAL | Status: DC

## 2021-12-31 MED ORDER — LORATADINE 10 MG PO TABS
10.0000 mg | ORAL_TABLET | Freq: Once | ORAL | Status: AC
  Administered 2021-12-31: 10 mg via ORAL
  Filled 2021-12-31: qty 1

## 2021-12-31 MED ORDER — LORATADINE 10 MG PO TABS
10.0000 mg | ORAL_TABLET | Freq: Every day | ORAL | 0 refills | Status: DC
  Filled 2021-12-31: qty 10, 10d supply, fill #0

## 2021-12-31 MED ORDER — ACETAMINOPHEN 500 MG PO TABS
1000.0000 mg | ORAL_TABLET | Freq: Once | ORAL | Status: AC
  Administered 2021-12-31: 1000 mg via ORAL
  Filled 2021-12-31: qty 2

## 2021-12-31 NOTE — Discharge Instructions (Addendum)
You may take tylenol for pain.

## 2021-12-31 NOTE — ED Notes (Signed)
Patient's legal guardian contacted by MD Palumbo.  Cab called for patient and patient is currently waiting for it.

## 2021-12-31 NOTE — ED Notes (Signed)
Patient refused ice, stating ice makes it hurt worse.  Patient able to hold cup of water without difficulty and manipulate hand and wrist to take medicine.

## 2021-12-31 NOTE — ED Notes (Signed)
Patient ambulated to the restroom with a steady gait.

## 2021-12-31 NOTE — ED Provider Notes (Signed)
[facility]ENC[facility] DEPART[facility] Provider Note   CSN: [id number] Arrival date & time: 12/31/21  0516     History  Chief Complaint  Patient presents with   Hand Pain    Kaitlyn Duncan is a 51 y.o. female.  The history is provided by the patient.  Hand Pain This is a new problem. The current episode started 6 to 12 hours ago. The problem occurs constantly. The problem has not changed since onset.Pertinent negatives include no chest pain, no abdominal pain, no headaches and no shortness of breath. Nothing aggravates the symptoms. Nothing relieves the symptoms. Treatments tried: fentanyl. The treatment provided moderate relief.  No trauma.  It appears to be an insect bite that occurred while she was sleeping.  No redness.      Home Medications Prior to Admission medications   Medication Sig Start Date End Date Taking? Authorizing Provider  acetaminophen (TYLENOL) 500 MG tablet Take 500-1,000 mg by mouth every 6 (six) hours as needed for mild pain or headache.    [provider]  clotrimazole (LOTRIMIN) 1 % cream Apply to affected area 2 times daily Patient not taking: Reported on 11/05/2021 10/29/21   Lajean Saver, MD  Docusate Sodium (STOOL SOFTENER LAXATIVE PO) Take by mouth daily.    [provider]      Allergies    Patient has no known allergies.    Review of Systems   Review of Systems  Constitutional:  Negative for fever.  HENT:  Negative for facial swelling.   Respiratory:  Negative for shortness of breath.   Cardiovascular:  Negative for chest pain.  Gastrointestinal:  Negative for abdominal pain.  Musculoskeletal:  Positive for arthralgias.  Neurological:  Negative for headaches.  All other systems reviewed and are negative.   Physical Exam Updated Vital Signs BP (!) 140/91 (BP Location: Left Arm)   Pulse 72   Temp 98.5 F (36.9 C) (Oral)   Resp (!) 22   SpO2 100%  Physical Exam Vitals and nursing note reviewed.   Constitutional:      General: She is not in acute distress.    Appearance: Normal appearance. She is well-developed. She is not diaphoretic.  HENT:     Head: Normocephalic and atraumatic.     Nose: Nose normal.  Eyes:     Pupils: Pupils are equal, round, and reactive to light.  Cardiovascular:     Rate and Rhythm: Normal rate and regular rhythm.     Pulses: Normal pulses.     Heart sounds: Normal heart sounds.  Pulmonary:     Effort: Pulmonary effort is normal. No respiratory distress.     Breath sounds: Normal breath sounds.  Abdominal:     General: Bowel sounds are normal. There is no distension.     Palpations: Abdomen is soft.     Tenderness: There is no abdominal tenderness. There is no guarding or rebound.  Genitourinary:    Vagina: No vaginal discharge.  Musculoskeletal:        General: Normal range of motion.     Right wrist: Normal. No snuff box tenderness.     Right hand: No deformity, lacerations or bony tenderness. Normal range of motion. Normal sensation. There is no disruption of two-point discrimination. Normal capillary refill. Normal pulse.       Arms:     Cervical back: Neck supple.  Skin:    General: Skin is warm and dry.     Capillary Refill: Capillary refill takes  less than 2 seconds.     Findings: No erythema or rash.  Neurological:     General: No focal deficit present.     Mental Status: She is alert and oriented to person, place, and time.     ED Results / Procedures / Treatments   Labs (all labs ordered are listed, but only abnormal results are displayed) Labs Reviewed - No data to display  EKG None  Radiology No results found.  Procedures Procedures    Medications Ordered in ED Medications  acetaminophen (TYLENOL) tablet 1,000 mg (has no administration in time range)  famotidine (PEPCID) tablet 20 mg (has no administration in time range)  diphenhydrAMINE (BENADRYL) capsule 25 mg (has no administration in time range)  ibuprofen  (ADVIL) tablet 800 mg (has no administration in time range)    ED Course/ Medical Decision Making/ A&P                           Medical Decision Making Awoke with small area of swelling and pain to the dorsum of the right hand.  No trauma.  No bruising.    Amount and/or Complexity of Data Reviewed External Data Reviewed: notes.    Details: previous notes reviewed Radiology: ordered and independent interpretation performed.    Details: no fracture by me Discussion of management or test interpretation with external provider(s): Legal guardian Ms. [name] notified per protocol of ED visit.   Risk OTC drugs. Prescription drug management. Risk Details: Small welt 9 mm welt noted on the dorsum of the right hand.  No warmth no erythema.  This is consistent with insect envenomation.  I have contacted patient's legal guardian to inform [name] of patient being in the ED and plan to discharge patient to home.  She verbalizes understanding of this information.      Final Clinical Impression(s) / ED Diagnoses Final diagnoses:  Right hand pain   Return for intractable cough, coughing up blood, fevers > 100.4 unrelieved by medication, shortness of breath, intractable vomiting, chest pain, shortness of breath, weakness, numbness, changes in speech, facial asymmetry, abdominal pain, passing out, Inability to tolerate liquids or food, cough, altered mental status or any concerns. No signs of systemic illness or infection. The patient is nontoxic-appearing on exam and vital signs are within normal limits.  I have reviewed the triage vital signs and the nursing notes. Pertinent labs & imaging results that were available during my care of the patient were reviewed by me and considered in my medical decision making (see chart for details). After history, exam, and medical workup I feel the patient has been appropriately medically screened and is safe for discharge home. Pertinent diagnoses  were discussed with the patient. Patient was given return precautions.  Rx / DC Orders ED Discharge Orders     None         Tonika Eden, MD 12/31/21 8309

## 2021-12-31 NOTE — ED Triage Notes (Signed)
Patient BIB [facility] c/o right hand pain that started last night before she went to bed.  Patient has small amount of swelling on the top of her wrist.  No redness, no warmth noted. Patient has tried ice without relief.   50 mcg of fentanyl given by EMS 154/94 71 99%

## 2022-01-01 ENCOUNTER — Other Ambulatory Visit (HOSPITAL_COMMUNITY): Payer: Self-pay

## 2022-01-19 ENCOUNTER — Inpatient Hospital Stay: Payer: Medicare Other

## 2022-01-19 ENCOUNTER — Other Ambulatory Visit: Payer: Medicare Other

## 2022-01-27 ENCOUNTER — Ambulatory Visit: Payer: Medicare Other | Admitting: Oncology

## 2022-02-02 ENCOUNTER — Other Ambulatory Visit: Payer: Self-pay

## 2022-02-02 ENCOUNTER — Inpatient Hospital Stay: Payer: Medicare Other

## 2022-02-02 ENCOUNTER — Inpatient Hospital Stay: Payer: Medicare Other | Attending: Oncology

## 2022-02-02 DIAGNOSIS — D696 Thrombocytopenia, unspecified: Secondary | ICD-10-CM | POA: Insufficient documentation

## 2022-02-02 DIAGNOSIS — Z85820 Personal history of malignant melanoma of skin: Secondary | ICD-10-CM | POA: Insufficient documentation

## 2022-02-02 DIAGNOSIS — C439 Malignant melanoma of skin, unspecified: Secondary | ICD-10-CM

## 2022-02-02 LAB — CBC WITH DIFFERENTIAL (CANCER CENTER ONLY)
Abs Immature Granulocytes: 0.01 10*3/uL (ref 0.00–0.07)
Basophils Absolute: 0 10*3/uL (ref 0.0–0.1)
Basophils Relative: 0 %
Eosinophils Absolute: 0 10*3/uL (ref 0.0–0.5)
Eosinophils Relative: 0 %
HCT: 39.7 % (ref 36.0–46.0)
Hemoglobin: 13.1 g/dL (ref 12.0–15.0)
Immature Granulocytes: 0 %
Lymphocytes Relative: 28 %
Lymphs Abs: 1.1 10*3/uL (ref 0.7–4.0)
MCH: 27.3 pg (ref 26.0–34.0)
MCHC: 33 g/dL (ref 30.0–36.0)
MCV: 82.9 fL (ref 80.0–100.0)
Monocytes Absolute: 0.4 10*3/uL (ref 0.1–1.0)
Monocytes Relative: 11 %
Neutro Abs: 2.3 10*3/uL (ref 1.7–7.7)
Neutrophils Relative %: 61 %
Platelet Count: 67 10*3/uL — ABNORMAL LOW (ref 150–400)
RBC: 4.79 MIL/uL (ref 3.87–5.11)
RDW: 13 % (ref 11.5–15.5)
WBC Count: 3.8 10*3/uL — ABNORMAL LOW (ref 4.0–10.5)
nRBC: 0 % (ref 0.0–0.2)

## 2022-02-02 LAB — CMP (CANCER CENTER ONLY)
ALT: 17 U/L (ref 0–44)
AST: 24 U/L (ref 15–41)
Albumin: 4.5 g/dL (ref 3.5–5.0)
Alkaline Phosphatase: 87 U/L (ref 38–126)
Anion gap: 5 (ref 5–15)
BUN: 19 mg/dL (ref 6–20)
CO2: 28 mmol/L (ref 22–32)
Calcium: 9.4 mg/dL (ref 8.9–10.3)
Chloride: 105 mmol/L (ref 98–111)
Creatinine: 0.66 mg/dL (ref 0.44–1.00)
GFR, Estimated: >60 mL/min (ref >60)
Glucose, Bld: 101 mg/dL — ABNORMAL HIGH (ref 70–99)
Potassium: 3.8 mmol/L (ref 3.5–5.1)
Sodium: 138 mmol/L (ref 135–145)
Total Bilirubin: 0.4 mg/dL (ref 0.3–1.2)
Total Protein: 7.7 g/dL (ref 6.5–8.1)

## 2022-02-04 ENCOUNTER — Ambulatory Visit (INDEPENDENT_AMBULATORY_CARE_PROVIDER_SITE_OTHER): Payer: Medicare Other | Admitting: Physician Assistant

## 2022-02-04 ENCOUNTER — Encounter: Payer: Self-pay | Admitting: Physician Assistant

## 2022-02-04 ENCOUNTER — Telehealth: Payer: Self-pay | Admitting: Family Medicine

## 2022-02-04 VITALS — BP 120/80 | HR 76 | Ht 60.0 in | Wt 138.2 lb

## 2022-02-04 DIAGNOSIS — K5900 Constipation, unspecified: Secondary | ICD-10-CM

## 2022-02-04 DIAGNOSIS — R140 Abdominal distension (gaseous): Secondary | ICD-10-CM | POA: Diagnosis not present

## 2022-02-04 MED ORDER — POLYETHYLENE GLYCOL 3350 17 G PO PACK: 17.0000 g | PACK | Freq: Every day | ORAL | 0 refills | Status: DC

## 2022-02-04 NOTE — Progress Notes (Signed)
Chief Complaint: Constipation, Bloating, Burping and Flatulence  HPI:    Kaitlyn Duncan is a  51 y/o Caucasian female, known to Dr. Tarri Glenn, with a past medical history as listed below including schizophrenia and malignant neoplasm metastatic to the brain,  who was referred to me by [name], NP-C for a complaint of constipation, bloating, burping and flatulence.      03/19/2020 colonoscopy normal other than internal and external hemorrhoids as well as a tortuous colon.  Repeat recommended 10 years    06/26/2021-07/15/2021 patient admitted to behavioral health hospital for bizarre behaviors including barricading herself in the bathroom at her extended stay hotel and hyperreligiosity.  She was noted to have schizophrenia with psychotic disorder type.  She was started on various medications.    06/30/2021 CT of the chest abdomen pelvis with contrast done for history of metastatic melanoma with bilateral adrenal nodules decreased in size, soft tissue lesion of the gallbladder increased in size when compared with prior exams, unchanged indeterminate hyperenhancing lesions of the caudate lobe of the liver and spleen, endometrial thickening which was increased.    07/08/2021 CBC with platelets decreased at 68.  BMP normal.    07/24/2021 patient seen in clinic by me for indigestion and burping with epigastric discomfort and constipation.  At that time discussed a lot of gas and burping as well as flatulence.  She was eating a lot of microwave meals.  At that time recommend the patient start Omeprazole 20 mg every morning and continue Gas-X before meals as well as MiraLAX for constipation.  Scheduled her for an EGD.    08/11/2021 EGD with a small hiatal hernia and erythematous duodenopathy and otherwise normal.  Biopsies were normal.    02/02/2022 CBC with a decreased platelet count at 67, CMP with a minimally elevated glucose of 101 and otherwise normal.    Today, patient returns to clinic and continues to  describe burping and "tooting" and constipation as well as bloating.  She blames this mostly on her diet of junk food and tells me she just cannot eat enough vegetables because her fridge freezes things.  She has started Colace daily which is helped some but still describes some struggle when actually trying to have a bowel movement.  Tells me she never tried the MiraLAX that we discussed before.  Does tell me that since starting the Colace her burping and tooting has been some better.    Denies fever, chills, blood in her stool or symptoms that awaken her from sleep.     Past Medical History:  Diagnosis Date   Allergy    Anemia    Gallstones    GERD (gastroesophageal reflux disease)    Hemorrhoids    melanoma with met dz dx'd 01/2018   brain and adrenal   Seasonal allergies     Past Surgical History:  Procedure Laterality Date   BIOPSY  01/28/2018   Procedure: BIOPSY;  Surgeon: Laurence Spates, MD;  Location: [facility];  Service: Endoscopy;;   BREAST BIOPSY Left    ESOPHAGOGASTRODUODENOSCOPY N/A 01/28/2018   Procedure: ESOPHAGOGASTRODUODENOSCOPY (EGD);  Surgeon: Laurence Spates, MD;  Location: [facility];  Service: Endoscopy;  Laterality: N/A;   MOUTH SURGERY     OVARY SURGERY     removed "something"    Current Outpatient Medications  Medication Sig Dispense Refill   acetaminophen (TYLENOL) 500 MG tablet Take 500-1,000 mg by mouth every 6 (six) hours as needed for mild pain or headache.  clotrimazole (LOTRIMIN) 1 % cream Apply to affected area 2 times daily (Patient not taking: Reported on 11/05/2021) 15 g 0   Docusate Sodium (STOOL SOFTENER LAXATIVE PO) Take by mouth daily.     loratadine (CLARITIN) 10 MG tablet Take 1 tablet (10 mg total) by mouth daily. 10 tablet 0   No current facility-administered medications for this visit.    Allergies as of 02/04/2022   (No Known Allergies)    Family History  Problem Relation Age of Onset   Melanoma Mother    Hypertension Father     Breast cancer Maternal Aunt    Breast cancer Paternal Aunt    Diabetes Paternal Aunt    Breast cancer Maternal Grandmother    Breast cancer Paternal Grandmother    Diabetes Paternal Grandmother    Colon cancer Neg Hx    Esophageal cancer Neg Hx    Pancreatic cancer Neg Hx    Stomach cancer Neg Hx    Liver disease Neg Hx    Rectal cancer Neg Hx     Social History   Socioeconomic History   Marital status: Single    Spouse name: Not on file   Number of children: Not on file   Years of education: Not on file   Highest education level: Not on file  Occupational History   Not on file  Tobacco Use   Smoking status: Never   Smokeless tobacco: Never  Vaping Use   Vaping Use: Never used  Substance and Sexual Activity   Alcohol use: No   Drug use: No   Sexual activity: Not Currently  Other Topics Concern   Not on file  Social History Narrative   Not on file   Social Determinants of Health   Financial Resource Strain: Not on file  Food Insecurity: Not on file  Transportation Needs: Not on file  Physical Activity: Not on file  Stress: Not on file  Social Connections: Not on file  Intimate Partner Violence: Not At Risk (03/30/2018)   Humiliation, Afraid, Rape, and Kick questionnaire    Fear of Current or Ex-Partner: No    Emotionally Abused: No    Physically Abused: No    Sexually Abused: No    Review of Systems:    Constitutional: No weight loss, fever or chills Cardiovascular: No chest pain Respiratory: No SOB  Gastrointestinal: See HPI and otherwise negative   Physical Exam:  Vital signs: BP 120/80   Pulse 76   Ht 5' (1.524 m)   Wt 138 lb 4 oz (62.7 kg)   SpO2 97%   BMI 27.00 kg/m    Constitutional:   Pleasant Caucasian female appears to be in NAD, Well developed, Well nourished, alert and cooperative Respiratory: Respirations even and unlabored. Lungs clear to auscultation bilaterally.   No wheezes, crackles, or rhonchi.  Cardiovascular: Normal S1, S2.  No MRG. Regular rate and rhythm. No peripheral edema, cyanosis or pallor.  Gastrointestinal:  Soft, nondistended, nontender. No rebound or guarding. Normal bowel sounds. No appreciable masses or hepatomegaly. Psychiatric: Oriented to person, place and time. Demonstrates good judgement and reason without abnormal affect or behaviors.  RELEVANT LABS AND IMAGING: CBC    Component Value Date/Time   WBC 3.8 (L) 02/02/2022 1207   WBC 4.0 12/24/2021 1408   RBC 4.79 02/02/2022 1207   HGB 13.1 02/02/2022 1207   HCT 39.7 02/02/2022 1207   PLT 67 (L) 02/02/2022 1207   MCV 82.9 02/02/2022 1207   MCH 27.3 02/02/2022 1207  MCHC 33.0 02/02/2022 1207   RDW 13.0 02/02/2022 1207   LYMPHSABS 1.1 02/02/2022 1207   MONOABS 0.4 02/02/2022 1207   EOSABS 0.0 02/02/2022 1207   BASOSABS 0.0 02/02/2022 1207    CMP     Component Value Date/Time   NA 138 02/02/2022 1207   K 3.8 02/02/2022 1207   CL 105 02/02/2022 1207   CO2 28 02/02/2022 1207   GLUCOSE 101 (H) 02/02/2022 1207   BUN 19 02/02/2022 1207   CREATININE 0.66 02/02/2022 1207   CALCIUM 9.4 02/02/2022 1207   PROT 7.7 02/02/2022 1207   ALBUMIN 4.5 02/02/2022 1207   AST 24 02/02/2022 1207   ALT 17 02/02/2022 1207   ALKPHOS 87 02/02/2022 1207   BILITOT 0.4 02/02/2022 1207   GFRNONAA >60 02/02/2022 1207   GFRAA >60 01/08/2020 1210    Assessment: 1.  Bloating and gas: Likely related to constipation and poor diet; if fixing constipation does not help then could discuss SIBO testing 2.  Constipation: With above  Plan: 1.  Continue to believe that patient's symptoms are related to her diet.  Discussed how important it is for her to eat fiber, whether this is through vegetables or grainy breads or even fiber 1 bar or fiber supplement. 2.  Also encouraged the patient to drink more water at least 6-8 8 ounce glasses of water per day. 3.  Encouraged exercise. 4.  Recommend the patient start MiraLAX on a daily basis.  Discussed using this for a  week and if no real change in symptoms then increasing to twice daily, if twice daily does not work for her she will call and let me know. 5.  She did ask about Linzess that she is seeing commercials but I feel like this would be very strong for her at the moment, we will see how MiraLAX works. 6.  Patient to follow in clinic with [place] as needed.  Ellouise Newer, PA-C [facility] 02/04/2022, 1:59 PM  Cc: Henson, Vickie L, NP-C

## 2022-02-04 NOTE — Telephone Encounter (Signed)
Pt established care with Harland Dingwall 12/24/21. Her legal guardian Vincente Liberty called and requested a transfer of care for the patient. She stated the pt was not aware that she was establishing care with a nurse practitioner. She stated she wants to establish care with a MD and was wanting to know if she can transfer from [facility] to [facility].    Would this transfer be ok?

## 2022-02-04 NOTE — Patient Instructions (Signed)
If you are age 53 or younger, your body mass index should be between 19-25. Your Body mass index is 27 kg/m. If this is out of the aformentioned range listed, please consider follow up with your Primary Care Provider.  ________________________________________________________  The [facility] providers would like to encourage you to use [facility] to communicate with providers for non-urgent requests or questions.  Due to long hold times on the telephone, sending your provider a message by [facility] may be a faster and more efficient way to get a response.  Please allow 48 business hours for a response.  Please remember that this is for non-urgent requests.  _______________________________________________________  Please purchase the following medications over the counter and take as directed:  START: Miralax daily  Please let us know how you are feeling by sending a MyChart message or calling [phone].  Thank you for entrusting me with your care and choosing [facility].  Ellouise Newer, PA-C

## 2022-02-05 ENCOUNTER — Other Ambulatory Visit (HOSPITAL_COMMUNITY): Payer: Self-pay

## 2022-02-07 NOTE — Progress Notes (Signed)
Reviewed.  Undrea L. Macoy Rodwell, MD, MPH  

## 2022-02-10 ENCOUNTER — Ambulatory Visit (HOSPITAL_COMMUNITY)
Admission: RE | Admit: 2022-02-10 | Discharge: 2022-02-10 | Disposition: A | Discharge status: Home / Self Care | Payer: Medicare Other | Source: Ambulatory Visit | Attending: Oncology | Admitting: Oncology

## 2022-02-10 DIAGNOSIS — J8410 Pulmonary fibrosis, unspecified: Secondary | ICD-10-CM | POA: Diagnosis not present

## 2022-02-10 DIAGNOSIS — K769 Liver disease, unspecified: Secondary | ICD-10-CM | POA: Diagnosis not present

## 2022-02-10 DIAGNOSIS — K8020 Calculus of gallbladder without cholecystitis without obstruction: Secondary | ICD-10-CM | POA: Diagnosis not present

## 2022-02-10 DIAGNOSIS — D259 Leiomyoma of uterus, unspecified: Secondary | ICD-10-CM | POA: Diagnosis not present

## 2022-02-10 DIAGNOSIS — C439 Malignant melanoma of skin, unspecified: Secondary | ICD-10-CM | POA: Diagnosis not present

## 2022-02-10 DIAGNOSIS — D7389 Other diseases of spleen: Secondary | ICD-10-CM | POA: Diagnosis not present

## 2022-02-10 DIAGNOSIS — J439 Emphysema, unspecified: Secondary | ICD-10-CM | POA: Diagnosis not present

## 2022-02-10 DIAGNOSIS — K449 Diaphragmatic hernia without obstruction or gangrene: Secondary | ICD-10-CM | POA: Diagnosis not present

## 2022-02-10 MED ORDER — SODIUM CHLORIDE (PF) 0.9 % IJ SOLN
INTRAMUSCULAR | Status: AC
  Filled 2022-02-10: qty 50

## 2022-02-10 MED ORDER — IOHEXOL 300 MG/ML  SOLN
100.0000 mL | Freq: Once | INTRAMUSCULAR | Status: AC | PRN
  Administered 2022-02-10: 100 mL via INTRAVENOUS

## 2022-02-10 MED ORDER — IOHEXOL 9 MG/ML PO SOLN: 1000.0000 mL | ORAL | Status: AC

## 2022-02-16 NOTE — Telephone Encounter (Signed)
Fine for TOC should keep care with current PCP until [facility] visit.

## 2022-02-19 ENCOUNTER — Telehealth: Payer: Self-pay | Admitting: Physician Assistant

## 2022-02-19 NOTE — Telephone Encounter (Signed)
Pt is on Miralax and says that it is not helping. It clogs her up and she wants to know if there's an alternative to her give relief. Please advise.

## 2022-02-22 MED ORDER — LINACLOTIDE 72 MCG PO CAPS: ORAL_CAPSULE | ORAL | 2 refills | Status: DC

## 2022-02-22 NOTE — Telephone Encounter (Signed)
Returned call to patient. She states that she tried the daily Miralax for a little over a week, but read on the Miralax bottle that she should not take it more than 7 days. I told her that her dose is being directed by a physician so it is perfectly safe. Pt states that she felt like the Miralax worked initially, but now it feels like she is getting backed up again. I informed pt that you recommended trying Miralax BID if daily dose was not effective. Pt stated that she did not want to try the increased dose of Miralax and wanted to try a laxative. She also stated that she did not feel like she needed to take the fiber so she has not done that either. Pt is having to strain some to have a BM. Pt asked that I call the land line at [phone], room 144. Please advise, thanks.

## 2022-02-22 NOTE — Telephone Encounter (Signed)
Returned call to patient at landline # [phone], this is a hotel. I entered her room # that she provided and no one answered the phone, no option to leave a vm.   I attempted to reach pt on her mobile phone twice as well. Her vm is not set up. Will try to reach patient at a different time.   Linzess prescription sent to [facility] on file.

## 2022-02-23 ENCOUNTER — Ambulatory Visit: Payer: Medicare Other | Admitting: Family Medicine

## 2022-02-23 NOTE — Telephone Encounter (Signed)
Called and spoke with patient. She is aware that we have sent prescription for Linzess to [facility]. Pt has been advised that she should take 1 tablet by mouth daily 30 minutes before breakfast to see if this will help her constipation. Pt verbalized understanding and had no concerns at the end of the call.

## 2022-02-24 ENCOUNTER — Encounter: Payer: Self-pay | Admitting: Family Medicine

## 2022-02-24 ENCOUNTER — Ambulatory Visit (INDEPENDENT_AMBULATORY_CARE_PROVIDER_SITE_OTHER): Payer: Medicare Other | Admitting: Family Medicine

## 2022-02-24 VITALS — BP 128/86 | HR 85 | Temp 97.8°F | Ht 60.0 in | Wt 140.0 lb

## 2022-02-24 DIAGNOSIS — N39498 Other specified urinary incontinence: Secondary | ICD-10-CM | POA: Diagnosis not present

## 2022-02-24 DIAGNOSIS — S0081XA Abrasion of other part of head, initial encounter: Secondary | ICD-10-CM | POA: Diagnosis not present

## 2022-02-24 DIAGNOSIS — R350 Frequency of micturition: Secondary | ICD-10-CM

## 2022-02-24 LAB — POCT URINALYSIS DIPSTICK
Bilirubin, UA: NEGATIVE
Blood, UA: NEGATIVE
Glucose, UA: NEGATIVE
Ketones, UA: NEGATIVE
Leukocytes, UA: NEGATIVE
Nitrite, UA: NEGATIVE
Protein, UA: NEGATIVE
Spec Grav, UA: 1.025 (ref 1.010–1.025)
Urobilinogen, UA: 1 E.U./dL
pH, UA: 7 (ref 5.0–8.0)

## 2022-02-24 NOTE — Patient Instructions (Signed)
Your urine test is negative for infection.   I recommend using over the counter Aquaphor on your nose and try to avoid touching the area.   I have placed referrals to [facility] and to a dermatologist.   If you have not heard from these places in the next 2-3 weeks, let us know.

## 2022-02-24 NOTE — Progress Notes (Signed)
Subjective: Chief Complaint  Patient presents with   Urinary Frequency    Has been going on for years, seems like it is more frequent at certain times and is unsure if she needs to be seen by urologist.      Kaitlyn Duncan is a 51 y.o. female who complains of possible urinary tract infection.  She has had symptoms for  10 years or longer   Symptoms include  urinary frequency, urgency and dysuria. Also notes urinary incontinence . Patient denies fever, chills, abdominal pain, back pain, N/V/D.   Requests referral to urologist. States she has never seen one.   She also complains of a non healing abrasion on her nose from a fall several months ago. Using several OTC therapies. Requests to see dermatologist.   Patient does not have a history of recurrent UTI. Patient does not have a history of pyelonephritis.  No other aggravating or relieving factors.  No other c/o.  Past Medical History:  Diagnosis Date   Allergy    Anemia    Gallstones    GERD (gastroesophageal reflux disease)    Hemorrhoids    melanoma with met dz dx'd 01/2018   brain and adrenal   Seasonal allergies     ROS as in subjective  Reviewed allergies, medications, past medical, surgical, and social history.    Objective: Vitals:   02/24/22 1349  BP: 128/86  Pulse: 85  Temp: 97.8 F (36.6 C)  SpO2: 98%    General appearance: alert, no distress, WD/WN, female Abdomen:  soft, non tender, non distended Back: no CVA tenderness GU: deferred      Laboratory:  Urine dipstick: negative for all components.       Assessment: Frequent urination - Plan: POCT urinalysis dipstick, Ambulatory referral to Urology  Frequent urinary incontinence - Plan: Ambulatory referral to Urology  Abrasion of face, initial encounter - Plan: Ambulatory referral to Dermatology   Plan: Urinalysis dipstick negative.  Ongoing urinary symptoms x 10 years per patient. Referral to urologist.  Referral to dermatologist per  patient request for non healing abrasion on nose. She will try Aquaphor for now. No sign of infection.

## 2022-03-04 ENCOUNTER — Ambulatory Visit: Payer: Medicare Other | Admitting: Oncology

## 2022-03-04 NOTE — Progress Notes (Deleted)
Hematology and Oncology Follow Up Visit  Kaitlyn Duncan [id number] [birth year] 51 y.o. 03/04/2022 9:24 AM Kaitlyn Rm, NP-CNo ref. provider found   Principle Diagnosis: 51 year old woman with stage IV melanoma diagnosed in July 2019.  She presented with peritoneal disease and CNS involvement without known primary.   Prior Therapy: He is status post subcutaneous nodule biopsy on 01/27/2018 which confirmed the presence of metastatic melanoma. She status post endoscopy on 01/28/2018 which showed subcutaneous involvement that consistent with metastatic melanoma.  Whole brain radiation for total of 30 Gy in 10 fractions started on 02/07/2018.   Mektovi 45 mg bid, [name] 450 mg daily started in August 2019.  Therapy discontinued in November 2019 after partial response.   Pembrolizumab 200 mg every 3 weeks started on 06/05/2018.  She is status post 12 cycles completed in July 2020.    Current therapy: Active surveillance.    Interim History: [name] presents today for a follow-up.  Since last visit,           .     Medications: Updated on review. Current Outpatient Medications  Medication Sig Dispense Refill   acetaminophen (TYLENOL) 500 MG tablet Take 500-1,000 mg by mouth every 6 (six) hours as needed for mild pain or headache.     clotrimazole (LOTRIMIN) 1 % cream Apply to affected area 2 times daily (Patient not taking: Reported on 02/04/2022) 15 g 0   docusate sodium (COLACE) 100 MG capsule Take 100 mg by mouth as needed for mild constipation.     Docusate Sodium (STOOL SOFTENER LAXATIVE PO) Take by mouth daily.     linaclotide (LINZESS) 72 MCG capsule Take 1 capsule (72 mcg total) by mouth daily 30 minutes before breakfast. 30 capsule 2   loratadine (CLARITIN) 10 MG tablet Take 1 tablet (10 mg total) by mouth daily. 10 tablet 0   milk thistle 175 MG tablet Take 175 mg by mouth daily.     polyethylene glycol (MIRALAX) 17 g packet Take 17 g by mouth daily. 14  each 0   Simethicone (GAS RELIEF 125 MAX ST PO) Take 1 tablet by mouth as needed.     Turmeric Curcumin 500 MG CAPS Take 1 capsule by mouth daily in the afternoon.     No current facility-administered medications for this visit.     Allergies: No Known Allergies  .     Physical Exam:           ECOG: 1    General appearance: Alert, awake without any distress. Head: Atraumatic without abnormalities Oropharynx: Without any thrush or ulcers. Eyes: No scleral icterus. Lymph nodes: No lymphadenopathy noted in the cervical, supraclavicular, or axillary nodes Heart:regular rate and rhythm, without any murmurs or gallops.   Lung: Clear to auscultation without any rhonchi, wheezes or dullness to percussion. Abdomin: Soft, nontender without any shifting dullness or ascites. Musculoskeletal: No clubbing or cyanosis. Neurological: No motor or sensory deficits. Skin: No rashes or lesions.                    Lab Results: Lab Results  Component Value Date   WBC 3.8 (L) 02/02/2022   HGB 13.1 02/02/2022   HCT 39.7 02/02/2022   MCV 82.9 02/02/2022   PLT 67 (L) 02/02/2022     Chemistry      Component Value Date/Time   NA 138 02/02/2022 1207   K 3.8 02/02/2022 1207   CL 105 02/02/2022 1207   CO2 28 02/02/2022 1207  BUN 19 02/02/2022 1207   CREATININE 0.66 02/02/2022 1207      Component Value Date/Time   CALCIUM 9.4 02/02/2022 1207   ALKPHOS 87 02/02/2022 1207   AST 24 02/02/2022 1207   ALT 17 02/02/2022 1207   BILITOT 0.4 02/02/2022 1207     IMPRESSION: 1. Further decrease in size of the bilateral adrenal nodules, likely treated metastases based on older prior imaging. 2. Chronic enhancing lesions within the caudate lobe of the liver and spleen are unchanged and likely incidental hemangiomas. 3. No new or enlarging lesions are identified to suggest progressive metastatic disease. 4. Current study demonstrates peripherally calcified gallstones,  but no evidence of gallbladder luminal mass, wall thickening or biliary dilatation. 5. Calcified uterine fibroid.     51 year old woman with:  1.  Stage IV melanoma of unknown primary presented with peritoneal, adrenal and CNS involvement in July 2019.   She continues to be on active surveillance after an excellent response to BRAF targeted therapy as well as immunotherapy.  2.  CNS metastasis: CT scan of the head obtained on 06/30/2021 did not show any evidence of metastatic disease.  3.  Prognosis: Her disease is incurable but she had an excellent disease control for an extended period of time without any treatment..   4.  Thrombocytopenia: Overall mild and asymptomatic.  She could have is an element of autoimmunity at this time.  5.  Follow-up: In 6 months for repeat evaluation.  30  minutes were spent on this visit.  The time was dedicated to reviewing laboratory data, disease status update and outlining future plan of care review.   Zola Button, MD 8/31/20239:24 AM

## 2022-03-17 ENCOUNTER — Inpatient Hospital Stay: Payer: Medicare Other | Attending: Oncology | Admitting: Oncology

## 2022-03-17 ENCOUNTER — Other Ambulatory Visit: Payer: Self-pay

## 2022-03-17 VITALS — BP 140/72 | HR 92 | Temp 98.1°F | Resp 17 | Ht 60.0 in | Wt 145.6 lb

## 2022-03-17 DIAGNOSIS — Z9221 Personal history of antineoplastic chemotherapy: Secondary | ICD-10-CM | POA: Diagnosis not present

## 2022-03-17 DIAGNOSIS — D696 Thrombocytopenia, unspecified: Secondary | ICD-10-CM | POA: Diagnosis not present

## 2022-03-17 DIAGNOSIS — C439 Malignant melanoma of skin, unspecified: Secondary | ICD-10-CM

## 2022-03-17 DIAGNOSIS — Z85820 Personal history of malignant melanoma of skin: Secondary | ICD-10-CM | POA: Diagnosis not present

## 2022-03-17 DIAGNOSIS — Z923 Personal history of irradiation: Secondary | ICD-10-CM | POA: Insufficient documentation

## 2022-03-17 NOTE — Progress Notes (Signed)
Hematology and Oncology Follow Up Visit  Kaitlyn Duncan [id number] Nov 28, 1970 51 y.o. 03/17/2022 2:34 PM [name], NP-CHenson, Vickie L, NP-C   Principle Diagnosis: 51 year old woman with stage IV melanoma diagnosed in July 2019.  She presented with peritoneal disease and CNS involvement without known primary.   Prior Therapy: He is status post subcutaneous nodule biopsy on 01/27/2018 which confirmed the presence of metastatic melanoma. She status post endoscopy on 01/28/2018 which showed subcutaneous involvement that consistent with metastatic melanoma.  Whole brain radiation for total of 30 Gy in 10 fractions started on 02/07/2018.   Mektovi 45 mg bid, [name] 450 mg daily started in August 2019.  Therapy discontinued in November 2019 after partial response.   Pembrolizumab 200 mg every 3 weeks started on 06/05/2018.  She is status post 12 cycles completed in July 2020.    Current therapy: Active surveillance.    Interim History: Ms. Canniff presents today for a follow-up.  Since last visit, she reports no major changes in her health.  She denies any recent hospitalizations or illnesses.  She denies any bone pain or pathological fractures.  She denies any falls or syncope.           .     Medications: Updated on review. Current Outpatient Medications  Medication Sig Dispense Refill   acetaminophen (TYLENOL) 500 MG tablet Take 500-1,000 mg by mouth every 6 (six) hours as needed for mild pain or headache.     clotrimazole (LOTRIMIN) 1 % cream Apply to affected area 2 times daily (Patient not taking: Reported on 02/04/2022) 15 g 0   docusate sodium (COLACE) 100 MG capsule Take 100 mg by mouth as needed for mild constipation.     Docusate Sodium (STOOL SOFTENER LAXATIVE PO) Take by mouth daily.     linaclotide (LINZESS) 72 MCG capsule Take 1 capsule (72 mcg total) by mouth daily 30 minutes before breakfast. 30 capsule 2   loratadine (CLARITIN) 10 MG tablet Take  1 tablet (10 mg total) by mouth daily. 10 tablet 0   milk thistle 175 MG tablet Take 175 mg by mouth daily.     polyethylene glycol (MIRALAX) 17 g packet Take 17 g by mouth daily. 14 each 0   Simethicone (GAS RELIEF 125 MAX ST PO) Take 1 tablet by mouth as needed.     Turmeric Curcumin 500 MG CAPS Take 1 capsule by mouth daily in the afternoon.     No current facility-administered medications for this visit.     Allergies: No Known Allergies  .     Physical Exam:           ECOG: 1    General appearance: Alert, awake without any distress. Head: Atraumatic without abnormalities Oropharynx: Without any thrush or ulcers. Eyes: No scleral icterus. Lymph nodes: No lymphadenopathy noted in the cervical, supraclavicular, or axillary nodes Heart:regular rate and rhythm, without any murmurs or gallops.   Lung: Clear to auscultation without any rhonchi, wheezes or dullness to percussion. Abdomin: Soft, nontender without any shifting dullness or ascites. Musculoskeletal: No clubbing or cyanosis. Neurological: No motor or sensory deficits. Skin: No rashes or lesions.                    Lab Results: Lab Results  Component Value Date   WBC 3.8 (L) 02/02/2022   HGB 13.1 02/02/2022   HCT 39.7 02/02/2022   MCV 82.9 02/02/2022   PLT 67 (L) 02/02/2022     Chemistry  Component Value Date/Time   NA 138 02/02/2022 1207   K 3.8 02/02/2022 1207   CL 105 02/02/2022 1207   CO2 28 02/02/2022 1207   BUN 19 02/02/2022 1207   CREATININE 0.66 02/02/2022 1207      Component Value Date/Time   CALCIUM 9.4 02/02/2022 1207   ALKPHOS 87 02/02/2022 1207   AST 24 02/02/2022 1207   ALT 17 02/02/2022 1207   BILITOT 0.4 02/02/2022 1207     IMPRESSION: 1. Further decrease in size of the bilateral adrenal nodules, likely treated metastases based on older prior imaging. 2. Chronic enhancing lesions within the caudate lobe of the liver and spleen are unchanged and  likely incidental hemangiomas. 3. No new or enlarging lesions are identified to suggest progressive metastatic disease. 4. Current study demonstrates peripherally calcified gallstones, but no evidence of gallbladder luminal mass, wall thickening or biliary dilatation. 5. Calcified uterine fibroid.     51 year old woman with:  1.  Stage IV melanoma of unknown primary presented with peritoneal, adrenal and CNS involvement in July 2019.   She continues to be on active surveillance after an excellent response to BRAF targeted therapy as well as immunotherapy.  CT scan obtained on February 10, 2022 was personally reviewed and showed further decrease in size of bilateral adrenal nodules without any evidence of areas of metastasis.  Based on these findings, I recommended continued active surveillance.   We will repeat imaging studies in the next 6 to 12 months pending her clinical status.  2.  CNS metastasis: CT scan of the head obtained on 06/30/2021 did not show any evidence of metastatic disease.  This will be repeated as needed.  3.  Prognosis: Her disease is incurable but she had an excellent disease control for an extended period of time without any treatment..   4.  Thrombocytopenia: Overall mild and asymptomatic without any active bleeding.  Differential diagnosis including splenic sequestration versus autoimmune etiology.  We will continue to monitor on future visits.  5.  Follow-up: In 6 months for repeat evaluation.  30  minutes were spent on this visit.  The time was dedicated to reviewing laboratory data, disease status update and outlining future plan of care review.   Zola Button, MD 9/13/20232:34 PM

## 2022-03-19 DIAGNOSIS — R351 Nocturia: Secondary | ICD-10-CM | POA: Diagnosis not present

## 2022-03-19 DIAGNOSIS — N3281 Overactive bladder: Secondary | ICD-10-CM | POA: Diagnosis not present

## 2022-04-06 ENCOUNTER — Telehealth: Payer: Self-pay | Admitting: Family Medicine

## 2022-04-06 NOTE — Telephone Encounter (Signed)
Voice mail box was not setup yet, when I call to schedule AWV-I with [facility].

## 2022-04-08 ENCOUNTER — Encounter (HOSPITAL_COMMUNITY): Payer: Self-pay

## 2022-04-08 ENCOUNTER — Encounter: Payer: Self-pay | Admitting: Family Medicine

## 2022-04-08 ENCOUNTER — Emergency Department (HOSPITAL_COMMUNITY): Payer: Medicare Other

## 2022-04-08 ENCOUNTER — Ambulatory Visit (INDEPENDENT_AMBULATORY_CARE_PROVIDER_SITE_OTHER): Payer: Medicare Other | Admitting: Family Medicine

## 2022-04-08 ENCOUNTER — Inpatient Hospital Stay (HOSPITAL_COMMUNITY)
Admission: EM | Admit: 2022-04-08 | Discharge: 2022-04-13 | DRG: 065 | Disposition: A | Discharge status: Home Health | Payer: Medicare Other | Attending: Internal Medicine | Admitting: Internal Medicine

## 2022-04-08 VITALS — BP 134/84 | HR 85 | Temp 97.6°F | Ht 60.0 in

## 2022-04-08 DIAGNOSIS — I6389 Other cerebral infarction: Principal | ICD-10-CM | POA: Diagnosis present

## 2022-04-08 DIAGNOSIS — R69 Illness, unspecified: Secondary | ICD-10-CM | POA: Diagnosis not present

## 2022-04-08 DIAGNOSIS — J309 Allergic rhinitis, unspecified: Secondary | ICD-10-CM | POA: Diagnosis present

## 2022-04-08 DIAGNOSIS — Y842 Radiological procedure and radiotherapy as the cause of abnormal reaction of the patient, or of later complication, without mention of misadventure at the time of the procedure: Secondary | ICD-10-CM | POA: Diagnosis present

## 2022-04-08 DIAGNOSIS — Z79899 Other long term (current) drug therapy: Secondary | ICD-10-CM

## 2022-04-08 DIAGNOSIS — M47812 Spondylosis without myelopathy or radiculopathy, cervical region: Secondary | ICD-10-CM | POA: Diagnosis not present

## 2022-04-08 DIAGNOSIS — Z9221 Personal history of antineoplastic chemotherapy: Secondary | ICD-10-CM | POA: Diagnosis not present

## 2022-04-08 DIAGNOSIS — Z8673 Personal history of transient ischemic attack (TIA), and cerebral infarction without residual deficits: Secondary | ICD-10-CM | POA: Diagnosis present

## 2022-04-08 DIAGNOSIS — Z8249 Family history of ischemic heart disease and other diseases of the circulatory system: Secondary | ICD-10-CM

## 2022-04-08 DIAGNOSIS — Z85841 Personal history of malignant neoplasm of brain: Secondary | ICD-10-CM

## 2022-04-08 DIAGNOSIS — J320 Chronic maxillary sinusitis: Secondary | ICD-10-CM | POA: Diagnosis not present

## 2022-04-08 DIAGNOSIS — Z833 Family history of diabetes mellitus: Secondary | ICD-10-CM

## 2022-04-08 DIAGNOSIS — I639 Cerebral infarction, unspecified: Secondary | ICD-10-CM | POA: Diagnosis present

## 2022-04-08 DIAGNOSIS — E785 Hyperlipidemia, unspecified: Secondary | ICD-10-CM | POA: Diagnosis present

## 2022-04-08 DIAGNOSIS — R471 Dysarthria and anarthria: Secondary | ICD-10-CM | POA: Diagnosis present

## 2022-04-08 DIAGNOSIS — I63542 Cerebral infarction due to unspecified occlusion or stenosis of left cerebellar artery: Secondary | ICD-10-CM | POA: Diagnosis not present

## 2022-04-08 DIAGNOSIS — R29810 Facial weakness: Secondary | ICD-10-CM | POA: Diagnosis present

## 2022-04-08 DIAGNOSIS — D696 Thrombocytopenia, unspecified: Secondary | ICD-10-CM | POA: Diagnosis not present

## 2022-04-08 DIAGNOSIS — W1830XA Fall on same level, unspecified, initial encounter: Secondary | ICD-10-CM | POA: Diagnosis present

## 2022-04-08 DIAGNOSIS — Z85820 Personal history of malignant melanoma of skin: Secondary | ICD-10-CM

## 2022-04-08 DIAGNOSIS — I611 Nontraumatic intracerebral hemorrhage in hemisphere, cortical: Secondary | ICD-10-CM | POA: Diagnosis not present

## 2022-04-08 DIAGNOSIS — Z803 Family history of malignant neoplasm of breast: Secondary | ICD-10-CM | POA: Diagnosis not present

## 2022-04-08 DIAGNOSIS — R296 Repeated falls: Secondary | ICD-10-CM

## 2022-04-08 DIAGNOSIS — C7931 Secondary malignant neoplasm of brain: Secondary | ICD-10-CM | POA: Diagnosis not present

## 2022-04-08 DIAGNOSIS — R262 Difficulty in walking, not elsewhere classified: Secondary | ICD-10-CM | POA: Diagnosis not present

## 2022-04-08 DIAGNOSIS — Z808 Family history of malignant neoplasm of other organs or systems: Secondary | ICD-10-CM | POA: Diagnosis not present

## 2022-04-08 DIAGNOSIS — Z923 Personal history of irradiation: Secondary | ICD-10-CM

## 2022-04-08 DIAGNOSIS — R270 Ataxia, unspecified: Secondary | ICD-10-CM | POA: Diagnosis not present

## 2022-04-08 DIAGNOSIS — R29701 NIHSS score 1: Secondary | ICD-10-CM | POA: Diagnosis present

## 2022-04-08 DIAGNOSIS — I517 Cardiomegaly: Secondary | ICD-10-CM | POA: Diagnosis not present

## 2022-04-08 DIAGNOSIS — R260 Ataxic gait: Secondary | ICD-10-CM | POA: Diagnosis present

## 2022-04-08 DIAGNOSIS — Z043 Encounter for examination and observation following other accident: Secondary | ICD-10-CM | POA: Diagnosis not present

## 2022-04-08 DIAGNOSIS — G8191 Hemiplegia, unspecified affecting right dominant side: Secondary | ICD-10-CM | POA: Diagnosis not present

## 2022-04-08 DIAGNOSIS — K219 Gastro-esophageal reflux disease without esophagitis: Secondary | ICD-10-CM | POA: Diagnosis present

## 2022-04-08 DIAGNOSIS — S199XXA Unspecified injury of neck, initial encounter: Secondary | ICD-10-CM | POA: Diagnosis not present

## 2022-04-08 LAB — CBC WITH DIFFERENTIAL/PLATELET
Abs Immature Granulocytes: 0.01 10*3/uL (ref 0.00–0.07)
Basophils Absolute: 0 10*3/uL (ref 0.0–0.1)
Basophils Relative: 0 %
Eosinophils Absolute: 0 10*3/uL (ref 0.0–0.5)
Eosinophils Relative: 0 %
HCT: 40.5 % (ref 36.0–46.0)
Hemoglobin: 13.1 g/dL (ref 12.0–15.0)
Immature Granulocytes: 0 %
Lymphocytes Relative: 25 %
Lymphs Abs: 1.1 10*3/uL (ref 0.7–4.0)
MCH: 27.4 pg (ref 26.0–34.0)
MCHC: 32.3 g/dL (ref 30.0–36.0)
MCV: 84.7 fL (ref 80.0–100.0)
Monocytes Absolute: 0.5 10*3/uL (ref 0.1–1.0)
Monocytes Relative: 11 %
Neutro Abs: 2.9 10*3/uL (ref 1.7–7.7)
Neutrophils Relative %: 64 %
Platelets: 74 10*3/uL — ABNORMAL LOW (ref 150–400)
RBC: 4.78 MIL/uL (ref 3.87–5.11)
RDW: 13.1 % (ref 11.5–15.5)
WBC: 4.6 10*3/uL (ref 4.0–10.5)
nRBC: 0 % (ref 0.0–0.2)

## 2022-04-08 LAB — URINALYSIS, ROUTINE W REFLEX MICROSCOPIC
Bilirubin Urine: NEGATIVE
Glucose, UA: NEGATIVE mg/dL
Hgb urine dipstick: NEGATIVE
Ketones, ur: NEGATIVE mg/dL
Leukocytes,Ua: NEGATIVE
Nitrite: NEGATIVE
Protein, ur: NEGATIVE mg/dL
Specific Gravity, Urine: 1.017 (ref 1.005–1.030)
pH: 7 (ref 5.0–8.0)

## 2022-04-08 LAB — BASIC METABOLIC PANEL
Anion gap: 7 (ref 5–15)
BUN: 19 mg/dL (ref 6–20)
CO2: 22 mmol/L (ref 22–32)
Calcium: 9.1 mg/dL (ref 8.9–10.3)
Chloride: 108 mmol/L (ref 98–111)
Creatinine, Ser: 0.64 mg/dL (ref 0.44–1.00)
GFR, Estimated: >60 mL/min (ref >60)
Glucose, Bld: 103 mg/dL — ABNORMAL HIGH (ref 70–99)
Potassium: 3.8 mmol/L (ref 3.5–5.1)
Sodium: 137 mmol/L (ref 135–145)

## 2022-04-08 LAB — CBG MONITORING, ED: Glucose-Capillary: 114 mg/dL — ABNORMAL HIGH (ref 70–99)

## 2022-04-08 MED ORDER — ACETAMINOPHEN 325 MG PO TABS: 650.0000 mg | ORAL_TABLET | Freq: Four times a day (QID) | ORAL | Status: DC | PRN

## 2022-04-08 MED ORDER — GADOPICLENOL 0.5 MMOL/ML IV SOLN
6.5000 mL | Freq: Once | INTRAVENOUS | Status: AC | PRN
  Administered 2022-04-08: 6.5 mL via INTRAVENOUS

## 2022-04-08 MED ORDER — ACETAMINOPHEN 650 MG RE SUPP: 650.0000 mg | Freq: Four times a day (QID) | RECTAL | Status: DC | PRN

## 2022-04-08 MED ORDER — STROKE: EARLY STAGES OF RECOVERY BOOK
Freq: Once | Status: AC
  Filled 2022-04-08 (×2): qty 1

## 2022-04-08 NOTE — ED Provider Triage Note (Signed)
Emergency Medicine Provider Triage Evaluation Note  Kaitlyn Duncan , a 51 y.o. female  was evaluated in triage.  Patient presenting from her PCP due to multiple falls.  She says that she has been having falls over the past couple days because she feels unsteady on her feet.  Denies any dizziness or lightheadedness before the falls.  No history of seizure or syncope.  Denies any head trauma and says that she usually just falls onto her bottom.  Review of Systems  Positive: Fall Negative: Confusion, head trauma, alcohol or drug use  Physical Exam  BP 126/88 (BP Location: Right Arm)   Pulse 83   Temp 98 F (36.7 C) (Oral)   Resp 18   Ht 5' (1.524 m)   Wt 64.2 kg   SpO2 99%   BMI 27.64 kg/m  Gen:   Awake, no distress   Resp:  Normal effort  MSK:   Moves extremities without difficulty  Other:  Mild right-sided facial droop.  No blurred vision.  Moving all extremities with normal strength.  Medical Decision Making  Medically screening exam initiated at 3:25 PM.  Appropriate orders placed.  Kaitlyn Duncan was informed that the remainder of the evaluation will be completed by another provider, this initial triage assessment does not replace that evaluation, and the importance of remaining in the ED until their evaluation is complete.   PCP lists a history of malignant neoplasm in patient's brain.  This is not present on her most recent CT in May however CT imaging was ordered for further evaluation today.  Any tumor would have likely metastasized from her skin cancer   Rhae Hammock, PA-C 04/11/22 [phone]

## 2022-04-08 NOTE — ED Provider Notes (Signed)
[facility] DEPT Provider Note   CSN: [id number] Arrival date & time: 04/08/22  1515     History  Chief Complaint  Patient presents with   Mobility Issues    Kaitlyn Duncan is a 51 y.o. female.  51 year old female with prior medical history as detailed below presents for evaluation.  Patient with 3 days of ataxia.  Patient reports cute onset of same.  Patient was seen by her PCP today and referred to the ED for evaluation.  Patient with prior history of stage IV melanoma.  She is currently in surveillance.  She is status post whole brain radiation treatment for CNS involvement of her melanoma.  Patient reports difficulty ambulating for the last 3 days.  She has to hold onto the wall or to an object in order to maintain her balance.  She has had 3 falls.  She denies any injury from the fall itself.    The history is provided by the patient and medical records.       Home Medications Prior to Admission medications   Medication Sig Start Date End Date Taking? Authorizing Provider  acetaminophen (TYLENOL) 500 MG tablet Take 500-1,000 mg by mouth every 6 (six) hours as needed for mild pain or headache.   Yes [provider]  clotrimazole (LOTRIMIN) 1 % cream Apply to affected area 2 times daily 10/29/21  Yes Lajean Saver, MD  Docusate Sodium (STOOL SOFTENER LAXATIVE PO) Take by mouth daily.   Yes [provider]  linaclotide (LINZESS) 72 MCG capsule Take 1 capsule (72 mcg total) by mouth daily 30 minutes before breakfast. 02/22/22  Yes Lemmon, Lavone Nian, PA  loratadine (CLARITIN) 10 MG tablet Take 1 tablet (10 mg total) by mouth daily. 12/31/21  Yes Palumbo, April, MD  milk thistle 175 MG tablet Take 175 mg by mouth daily.   Yes [provider]  Multiple Vitamins-Minerals (HAIR SKIN AND NAILS FORMULA) TABS Take 2 tablets by mouth daily.   Yes [provider]  Multiple Vitamins-Minerals (MULTIVITAMIN GUMMIES  ADULT PO) Take 1 tablet by mouth.   Yes [provider]  Simethicone (GAS RELIEF 125 MAX ST PO) Take 1 tablet by mouth as needed.   Yes [provider]  Turmeric Curcumin 500 MG CAPS Take 1 capsule by mouth daily in the afternoon.   Yes [provider]  polyethylene glycol (MIRALAX) 17 g packet Take 17 g by mouth daily. Patient not taking: Reported on 04/08/2022 02/04/22   Levin Erp, PA      Allergies    Patient has no known allergies.    Review of Systems   Review of Systems  All other systems reviewed and are negative.   Physical Exam Updated Vital Signs BP (!) 155/101   Pulse 75   Temp 98 F (36.7 C)   Resp 18   SpO2 99%  Physical Exam Vitals and nursing note reviewed.  Constitutional:      General: She is not in acute distress.    Appearance: Normal appearance. She is well-developed.  HENT:     Head: Normocephalic and atraumatic.  Eyes:     Conjunctiva/sclera: Conjunctivae normal.     Pupils: Pupils are equal, round, and reactive to light.  Cardiovascular:     Rate and Rhythm: Normal rate and regular rhythm.     Heart sounds: Normal heart sounds.  Pulmonary:     Effort: Pulmonary effort is normal. No respiratory distress.     Breath  sounds: Normal breath sounds.  Abdominal:     General: There is no distension.     Palpations: Abdomen is soft.     Tenderness: There is no abdominal tenderness.  Musculoskeletal:        General: No deformity. Normal range of motion.     Cervical back: Normal range of motion and neck supple.  Skin:    General: Skin is warm and dry.  Neurological:     General: No focal deficit present.     Mental Status: She is alert and oriented to person, place, and time.     Cranial Nerves: No cranial nerve deficit.     Sensory: No sensory deficit.     Comments: Mildly ataxic with sitting.     ED Results / Procedures / Treatments   Labs (all labs ordered are listed, but only abnormal results are  displayed) Labs Reviewed  CBC WITH DIFFERENTIAL/PLATELET - Abnormal; Notable for the following components:      Result Value   Platelets 74 (*)    All other components within normal limits  BASIC METABOLIC PANEL - Abnormal; Notable for the following components:   Glucose, Bld 103 (*)    All other components within normal limits  CBG MONITORING, ED - Abnormal; Notable for the following components:   Glucose-Capillary 114 (*)    All other components within normal limits  URINALYSIS, ROUTINE W REFLEX MICROSCOPIC    EKG EKG Interpretation  Date/Time:  Thursday April 08 2022 15:32:23 EDT Ventricular Rate:  90 PR Interval:  149 QRS Duration: 95 QT Interval:  374 QTC Calculation: 458 R Axis:   100 Text Interpretation: Sinus rhythm Right axis deviation Confirmed by Dene Gentry [phone]) on 04/08/2022 6:25:54 PM  Radiology DG Pelvis 1-2 Views  Result Date: 04/08/2022 CLINICAL DATA:  [place], history of metastatic melanoma EXAM: PELVIS - 1-2 VIEW COMPARISON:  10/17/2021 FINDINGS: Single frontal view of the pelvis includes both hips. There are no acute or destructive bony abnormalities. Stable sclerotic focus within the right iliac crest compatible with bone island. Joint spaces are well preserved. Alignment is anatomic. Soft tissues are normal. IMPRESSION: 1. Unremarkable pelvis and bilateral hips. Electronically Signed   By: Randa Ngo M.D.   On: 04/08/2022 16:07   DG Chest 2 View  Result Date: 04/08/2022 CLINICAL DATA:  51 year old female presenting with difficulty ambulating post fall. EXAM: CHEST - 2 VIEW COMPARISON:  CT of the chest abdomen and pelvis of February 10, 2022. October 16, 2021 chest radiograph. FINDINGS: Cardiomediastinal contours and hilar structures are stable. Mild cardiomegaly. No consolidation. No pleural effusion. No pneumothorax. On limited assessment no acute skeletal findings. IMPRESSION: Mild cardiomegaly without acute cardiopulmonary disease. Electronically Signed    By: Zetta Bills M.D.   On: 04/08/2022 16:07   CT Cervical Spine Wo Contrast  Result Date: 04/08/2022 CLINICAL DATA:  Neck trauma, dangerous injury mechanism (Age 67-64y). Mobility/ambulation difficulties. EXAM: CT CERVICAL SPINE WITHOUT CONTRAST TECHNIQUE: Multidetector CT imaging of the cervical spine was performed without intravenous contrast. Multiplanar CT image reconstructions were also generated. RADIATION DOSE REDUCTION: This exam was performed according to the departmental dose-optimization program which includes automated exposure control, adjustment of the mA and/or kV according to patient size and/or use of iterative reconstruction technique. COMPARISON:  11/05/2021 neck CT. FINDINGS: Substantially motion degraded scan, limiting assessment. Alignment: Straightening of the cervical spine. No facet subluxation. Dens is well positioned between the lateral masses of C1. Mild 2 mm anterolisthesis at C4-5. Skull base and vertebrae:  No acute fracture. No primary bone lesion or focal pathologic process. Soft tissues and spinal canal: No prevertebral edema. No visible canal hematoma. Disc levels: Mild-to-moderate multilevel cervical degenerative disc disease, most prominent at C5-6. Moderate bilateral facet arthropathy. Unable to accurately assess for foraminal stenosis due to motion degradation. Upper chest: No acute abnormality. Other: Visualized mastoid air cells appear clear. No discrete thyroid nodules. No pathologically enlarged cervical nodes. IMPRESSION: 1. Substantially motion degraded scan, limiting assessment. No gross evidence of cervical spine fracture or facet subluxation. 2. Mild-to-moderate multilevel degenerative changes in the cervical spine as described. 3. Mild 2 mm anterolisthesis at C4-5, probably degenerative. Electronically Signed   By: Ilona Sorrel M.D.   On: 04/08/2022 16:06   CT Head Wo Contrast  Result Date: 04/08/2022 CLINICAL DATA:  Trauma EXAM: CT HEAD WITHOUT CONTRAST  TECHNIQUE: Contiguous axial images were obtained from the base of the skull through the vertex without intravenous contrast. RADIATION DOSE REDUCTION: This exam was performed according to the departmental dose-optimization program which includes automated exposure control, adjustment of the mA and/or kV according to patient size and/or use of iterative reconstruction technique. COMPARISON:  Nov 04, 2021 CT Brain FINDINGS: Brain: Redemonstrated sequela of severe chronic microvascular ischemic change with interval development of a new hypodensity in the left internal capsule (series 3, image 17). Size and shape of the ventricular system is unchanged compared to prior exam. Redemonstrated generalized volume loss. No evidence of hemorrhage, extra-axial collection or mass lesion/mass effect. Vascular: No hyperdense vessel or unexpected calcification. Skull: Negative for fracture or focal lesion. Sinuses/Orbits: No acute finding. Other: None. IMPRESSION: 1. No CT evidence of intracranial injury. 2. New hypodensity in the left internal capsule compared to 11/04/21. Given patient's history, this is suspicious for a new infarct. Electronically Signed   By: Marin Roberts M.D.   On: 04/08/2022 16:05    Procedures Procedures    Medications Ordered in ED Medications  gadopiclenol (VUEWAY) 0.5 MMOL/ML solution 6.5 mL (6.5 mLs Intravenous Contrast Given 04/08/22 2202)    ED Course/ Medical Decision Making/ A&P                           Medical Decision Making Amount and/or Complexity of Data Reviewed Radiology: ordered.  Risk Prescription drug management.    Medical Screen Complete  This patient presented to the ED with complaint of ataxia.  This complaint involves an extensive number of treatment options. The initial differential diagnosis includes, but is not limited to, CVA, metabolic abnormality, etc.  This presentation is: Acute, Self-Limited, Previously Undiagnosed, Uncertain Prognosis, Complicated,  Systemic Symptoms, and Threat to Life/Bodily Function  Patient is presenting with 3 days of ataxia.  Patient reports acute onset of symptoms.  History / exam is concerning for possible CVA.  CT and MR demonstrate evidence of acute infarct. She is outside the window of lytics or acute neuro intervention.  Patient require admission.  Neurology aware of case and will evaluate in consultation.  Dr. Leonel Ramsay requests bed placement at [facility].  He also requests aspirin alone (no dual platelet therapy at this time).  Hospitalist service made aware of case will evaluate for admission.   Additional history obtained: External records from outside sources obtained and reviewed including prior ED visits and prior Inpatient records.    Lab Tests:  I ordered and personally interpreted labs.  The pertinent results include: CBC, BMP, UA   Imaging Studies ordered:  I ordered imaging studies including  CT head, MRI brain I independently visualized and interpreted obtained imaging which showed acute CVA I agree with the radiologist interpretation.   Cardiac Monitoring:  The patient was maintained on a cardiac monitor.  I personally viewed and interpreted the cardiac monitor which showed an underlying rhythm of: NSR  Problem List / ED Course:  Ataxia, acute CVA   Reevaluation:  After the interventions noted above, I reevaluated the patient and found that they have: stayed the same   Disposition:  After consideration of the diagnostic results and the patients response to treatment, I feel that the patent would benefit from admission.   CRITICAL CARE Performed by: Valarie Merino   Total critical care time: 30 minutes  Critical care time was exclusive of separately billable procedures and treating other patients.  Critical care was necessary to treat or prevent imminent or life-threatening deterioration.  Critical care was time spent personally by me on the following activities:  development of treatment plan with patient and/or surrogate as well as nursing, discussions with consultants, evaluation of patient's response to treatment, examination of patient, obtaining history from patient or surrogate, ordering and performing treatments and interventions, ordering and review of laboratory studies, ordering and review of radiographic studies, pulse oximetry and re-evaluation of patient's condition.          Final Clinical Impression(s) / ED Diagnoses Final diagnoses:  Acute CVA (cerebrovascular accident) [facility])    Rx / [place] Orders ED Discharge Orders     None         Valarie Merino, MD 04/08/22 2304

## 2022-04-08 NOTE — Patient Instructions (Signed)
Please go straight to the [facility] emergency department for evaluation.

## 2022-04-08 NOTE — ED Triage Notes (Signed)
Pt presents via EMS from a doctor's office with c/o mobility issues. Pt was there for difficulty ambulating and the MD thought she she needed to be seen for a "scan".

## 2022-04-08 NOTE — ED Notes (Signed)
Pt walked to the bathroom and back to her room with no complaints.

## 2022-04-08 NOTE — Progress Notes (Signed)
Subjective:     Patient ID: Kaitlyn Duncan, female    DOB: 1970/08/28, 51 y.o.   MRN: [id number]  Chief Complaint  Patient presents with   hand swelling    Both hands noticed to be swollen yesterday, not as much swelling today   off balance    Has been feeling very wobbly and off balance for about 3 days now, has has multiple falls    HPI Patient is in today for falls and not being able to walk. This is a sudden change for the past 3 days. States today she has a headache. She took Tylenol and it has helped somewhat.  Denies LOC, head injury, neck or back pain.   No chest pain, palpitations, shortness of breath, abdominal pain, vomiting or diarrhea.   States her hands are swollen.     Health Maintenance Due  Topic Date Due   HIV Screening  Never done   Hepatitis C Screening  Never done   MAMMOGRAM  02/18/2022    Past Medical History:  Diagnosis Date   Allergy    Anemia    Gallstones    GERD (gastroesophageal reflux disease)    Hemorrhoids    melanoma with met dz dx'd 01/2018   brain and adrenal   Seasonal allergies     Past Surgical History:  Procedure Laterality Date   BIOPSY  01/28/2018   Procedure: BIOPSY;  Surgeon: Kaitlyn Spates, MD;  Location: [facility]COPY;  Service: Endoscopy;;   BREAST BIOPSY Left    ESOPHAGOGASTRODUODENOSCOPY N/A 01/28/2018   Procedure: ESOPHAGOGASTRODUODENOSCOPY (EGD);  Surgeon: Kaitlyn Spates, MD;  Location: [facility];  Service: Endoscopy;  Laterality: N/A;   MOUTH SURGERY     OVARY SURGERY     removed "something"    Family History  Problem Relation Age of Onset   Melanoma Mother    Hypertension Father    Breast cancer Maternal Aunt    Breast cancer Paternal Aunt    Diabetes Paternal Aunt    Breast cancer Maternal Grandmother    Breast cancer Paternal Grandmother    Diabetes Paternal Grandmother    Colon cancer Neg Hx    Esophageal cancer Neg Hx    Pancreatic cancer Neg Hx    Stomach cancer Neg Hx    Liver disease  Neg Hx    Rectal cancer Neg Hx     Social History   Socioeconomic History   Marital status: Single    Spouse name: Not on file   Number of children: Not on file   Years of education: Not on file   Highest education level: Not on file  Occupational History   Not on file  Tobacco Use   Smoking status: Never   Smokeless tobacco: Never  Vaping Use   Vaping Use: Never used  Substance and Sexual Activity   Alcohol use: No   Drug use: No   Sexual activity: Not Currently  Other Topics Concern   Not on file  Social History Narrative   Not on file   Social Determinants of Health   Financial Resource Strain: Not on file  Food Insecurity: Not on file  Transportation Needs: Not on file  Physical Activity: Not on file  Stress: Not on file  Social Connections: Not on file  Intimate Partner Violence: Not At Risk (03/30/2018)   Humiliation, Afraid, Rape, and Kick questionnaire    Fear of Current or Ex-Partner: No    Emotionally Abused: No    Physically Abused:  No    Sexually Abused: No    Outpatient Medications Prior to Visit  Medication Sig Dispense Refill   acetaminophen (TYLENOL) 500 MG tablet Take 500-1,000 mg by mouth every 6 (six) hours as needed for mild pain or headache.     clotrimazole (LOTRIMIN) 1 % cream Apply to affected area 2 times daily 15 g 0   docusate sodium (COLACE) 100 MG capsule Take 100 mg by mouth as needed for mild constipation.     Docusate Sodium (STOOL SOFTENER LAXATIVE PO) Take by mouth daily.     linaclotide (LINZESS) 72 MCG capsule Take 1 capsule (72 mcg total) by mouth daily 30 minutes before breakfast. 30 capsule 2   loratadine (CLARITIN) 10 MG tablet Take 1 tablet (10 mg total) by mouth daily. 10 tablet 0   milk thistle 175 MG tablet Take 175 mg by mouth daily.     Multiple Vitamins-Minerals (HAIR SKIN AND NAILS FORMULA) TABS Take 2 tablets by mouth daily.     Multiple Vitamins-Minerals (MULTIVITAMIN GUMMIES ADULT PO) Take 1 tablet by mouth.      polyethylene glycol (MIRALAX) 17 g packet Take 17 g by mouth daily. 14 each 0   Simethicone (GAS RELIEF 125 MAX ST PO) Take 1 tablet by mouth as needed.     Turmeric Curcumin 500 MG CAPS Take 1 capsule by mouth daily in the afternoon.     No facility-administered medications prior to visit.    No Known Allergies  ROS     Objective:    Physical Exam HENT:     Mouth/Throat:     Mouth: Mucous membranes are moist.  Eyes:     Extraocular Movements: Extraocular movements intact.     Conjunctiva/sclera: Conjunctivae normal.     Pupils: Pupils are equal, round, and reactive to light.  Cardiovascular:     Rate and Rhythm: Normal rate and regular rhythm.  Pulmonary:     Effort: Pulmonary effort is normal.     Breath sounds: Normal breath sounds.  Musculoskeletal:     Cervical back: Normal range of motion and neck supple.  Skin:    General: Skin is warm and dry.  Neurological:     General: No focal deficit present.     Mental Status: She is alert.     Cranial Nerves: No facial asymmetry.     Sensory: Sensation is intact.     Motor: Tremor present. No pronator drift.     Coordination: Heel to Shin Test abnormal.     Gait: Gait abnormal.     BP 134/84 (BP Location: Left Arm, Patient Position: Sitting, Cuff Size: Large)   Pulse 85   Temp 97.6 F (36.4 C) (Temporal)   Ht 5' (1.524 m)   SpO2 98%   BMI 28.44 kg/m  Wt Readings from Last 3 Encounters:  03/17/22 145 lb 9.6 oz (66 kg)  02/24/22 140 lb (63.5 kg)  02/04/22 138 lb 4 oz (62.7 kg)        Assessment & Plan:   Problem List Items Addressed This Visit       Nervous and Auditory   Malignant neoplasm metastatic to brain Kaitlyn Duncan)   Other Visit Diagnoses     Multiple falls    -  Primary   Ataxia          Stage IV melanoma diagnosed in July 2019. She was seen by Kaitlyn Duncan last month for a follow up and was stable at that time.  For the  past 3 days she has been unable to walk and has fallen to the ground 3 times.   In the office today, ataxia is present.  She lives in a hotel room alone.  A member of ACT team is with her today. They will not be able to transport her to the ED.  Ambulance called for transport to the ED for further evaluation of new onset of falls and ataxia.   I am having Kaitlyn Shelling. Domingo Cocking "[name]" maintain her acetaminophen, clotrimazole, Docusate Sodium (STOOL SOFTENER LAXATIVE PO), loratadine, docusate sodium, Simethicone (GAS RELIEF 125 MAX ST PO), milk thistle, Turmeric Curcumin, polyethylene glycol, linaclotide, Hair Skin and Nails Formula, and Multiple Vitamins-Minerals (MULTIVITAMIN GUMMIES ADULT PO).  No orders of the defined types were placed in this encounter.

## 2022-04-08 NOTE — H&P (Signed)
History and Physical    PLEASE NOTE THAT DRAGON DICTATION SOFTWARE WAS USED IN THE CONSTRUCTION OF THIS NOTE.   [name] SWH:[id number] DOB: 09/07/1970 DOA: 04/08/2022  PCP: [name], NP-C  Patient coming from: home   I have personally briefly reviewed patient's old medical records in [place]  Chief Complaint: " Unsteadiness on feet"  HPI: Kaitlyn Duncan is a 51 y.o. 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   While the patient does not remember the specific day, she reports that she went to bed around 2130 on either Sunday, 04/04/2022, or Monday, 10-23 asymptomatic, before awakening around 8 AM the following day, which was either Monday, 10-23 or Tuesday, 04/06/2022, with new onset unsteadiness on her feet resulting in her reported gait instability.  In this context, she notes 3 ensuing ground-level falls which were not associate with any loss of consciousness, or hitting of the head.  Denies any ensuing headache, neck pain, or new onset will arthralgias or myalgias.  Not on any blood thinners as an outpatient, including no aspirin.  She notes that the new onset unsteadiness on her feet has been persistent since onset, has not been associated with acute focal weakness, numbness, paresthesias, dysphagia, vertigo, nausea, vomiting, change in vision, word finding difficulties, slurring of speech, facial droop.  Denies any associated chest pain, shortness of breath, palpitations, diaphoresis, presyncope, or syncope.  Denies any previous history of stroke. pt denies any known h/o hypertension, hyperlipidemia, diabetes,  paroxysmal atrial fibrillation, or obstructive sleep apnea.  She also confirms that she is a lifelong non-smoker. Not currently taking a statin.  Medical history notable for stage IV melanoma, diagnosed in July 2019, with metastatic disease to the brain and adrenal gland.  She is status post full brain radiation, and melanomas now reportedly in remission.  In the setting of persistence of her new unsteadiness on her feet, the patient presents to [facility] emergency department this evening for further evaluation management thereof.     ED Course:  Vital signs in the ED were notable for the following: Afebrile; heart rate 66-59; systolic blood pressures in the 140s to 150s; respiratory rate 16-18, oxygen saturation 99 to 100% on room air.  Labs were notable for the following: BMP notable for the following: Sodium 137, bicarbonate 22, creatinine 0.64, glucose 103.  CBC notable for white blood cell count 4600, hemoglobin 13.1, platelet count 74 compared to 67 on August 2023.  Urinalysis notable for no white blood cells.  Imaging and additional notable ED work-up: EKG shows sinus rhythm with heart rate 90, normal intervals, and no evidence of T wave or ST changes, including no evidence of ST elevation.  Noncontrast CT head shows no evidence of intracranial hemorrhage, while showing new hypodensity in the left internal capsule to imaging from 11/04/2021, suggestive of new infarct.  CT cervical spine showed no evidence of acute cervical spine fracture or subluxation injury.  2 view chest x-ray shows mild cardiomegaly, without evidence of acute cardiopulmonary process.  Plain films of the pelvis and bilateral hip showed no evidence of acute process, including no evidence of acute fracture.  MRI of the brain with and without  contrast shows 1.3 cm acute ischemic nonhemorrhagic left thalamic capsular infarct; MRI brain also shows findings consistent with previously treated in the setting of a history of  metastatic melanoma, without evidence of interval disease progression or evidence of active disease.  EDP discussed patient's case and imaging with the on-call neurologist, Dr.  Leonel Ramsay, who recommended admission to the hospitalist service at [facility] for further stroke work-up, including further assessment of potential modifiable ischemic CVA risk factors.  In the setting of patient's history of metastatic melanoma to the brain, Dr. Leonel Ramsay requests that we refrain from initiation of dual antiplatelet therapy, but rather proceed with daily baby aspirin. Neurology to formally consult, with additional recommendations to follow.   While in the ED, the following were administered: (none)  Subsequently, the patient was admitted to [facility] for further evaluation management of presenting acute ischemic stroke.   Review of Systems: As per HPI otherwise 10 point review of systems negative.   Past Medical History:  Diagnosis Date   Allergy    Anemia    Gallstones    GERD (gastroesophageal reflux disease)    Hemorrhoids    melanoma with met dz dx'd 01/2018   brain and adrenal   Seasonal allergies     Past Surgical History:  Procedure Laterality Date   BIOPSY  01/28/2018   Procedure: BIOPSY;  Surgeon: Laurence Spates, MD;  Location: [facility] ENDOSCOPY;  Service: Endoscopy;;   BREAST BIOPSY Left    ESOPHAGOGASTRODUODENOSCOPY N/A 01/28/2018   Procedure: ESOPHAGOGASTRODUODENOSCOPY (EGD);  Surgeon: Laurence Spates, MD;  Location: [facility];  Service: Endoscopy;  Laterality: N/A;   MOUTH SURGERY     OVARY SURGERY     removed "something"    Social History:  reports that she has never smoked. She has never used smokeless tobacco. She reports that she does not drink alcohol and does not use drugs.   No Known Allergies  Family History  Problem Relation Age of Onset   Melanoma Mother    Hypertension Father    Breast cancer Maternal Aunt    Breast cancer Paternal Aunt    Diabetes  Paternal Aunt    Breast cancer Maternal Grandmother    Breast cancer Paternal Grandmother    Diabetes Paternal Grandmother    Colon cancer Neg Hx    Esophageal cancer Neg Hx    Pancreatic cancer Neg Hx    Stomach cancer Neg Hx    Liver disease Neg Hx    Rectal cancer Neg Hx     Family history reviewed and not pertinent    Prior to Admission medications   Medication Sig Start Date End Date Taking? Authorizing Provider  acetaminophen (TYLENOL) 500 MG tablet Take 500-1,000 mg by mouth every 6 (six) hours as needed for mild pain or headache.   Yes [provider]  clotrimazole (LOTRIMIN) 1 % cream Apply to affected area 2 times daily 10/29/21  Yes Lajean Saver, MD  Docusate Sodium (STOOL SOFTENER LAXATIVE PO) Take by mouth daily.   Yes [provider]  linaclotide (LINZESS) 72 MCG capsule Take 1 capsule (72 mcg total) by mouth daily 30 minutes before breakfast. 02/22/22  Yes Lemmon, Lavone Nian, PA  loratadine (CLARITIN) 10 MG tablet Take 1 tablet (10 mg total) by mouth daily. 12/31/21  Yes Palumbo, April, MD  milk thistle 175 MG tablet Take 175 mg by mouth daily.   Yes [provider]  Multiple Vitamins-Minerals (HAIR SKIN AND NAILS FORMULA) TABS Take 2 tablets by mouth daily.  Yes [provider]  Multiple Vitamins-Minerals (MULTIVITAMIN GUMMIES ADULT PO) Take 1 tablet by mouth.   Yes [provider]  Simethicone (GAS RELIEF 125 MAX ST PO) Take 1 tablet by mouth as needed.   Yes [provider]  Turmeric Curcumin 500 MG CAPS Take 1 capsule by mouth daily in the afternoon.   Yes [provider]  polyethylene glycol (MIRALAX) 17 g packet Take 17 g by mouth daily. Patient not taking: Reported on 04/08/2022 02/04/22   Levin Erp, PA     Objective    Physical Exam: Vitals:   04/08/22 2015 04/08/22 2030 04/08/22 2152 04/08/22 2239  BP: (!) 151/89 (!) 147/106  (!) 155/101  Pulse: 67 83  75  Resp: _0 Temp:   98 F (36.7 C)   TempSrc:      SpO2: 100% 100%  99%    General: appears to be stated age; alert, oriented Skin: warm, dry, no rash Head:  AT/[place] Mouth:  Oral mucosa membranes appear moist, normal dentition Neck: supple; trachea midline Heart:  RRR; did not appreciate any M/R/G Lungs: CTAB, did not appreciate any wheezes, rales, or rhonchi Abdomen: + BS; soft, ND, NT Vascular: 2+ pedal pulses b/l; 2+ radial pulses b/l Extremities: no peripheral edema, no muscle wasting Neuro: strength and sensation intact in upper and lower extremities b/l; sensation intact in upper and lower extremities b/l; cranial nerves II through XII grossly intact; no pronator drift; no evidence suggestive of slurred speech, dysarthria, or facial droop; Normal muscle tone. No tremors.    Labs on Admission: I have personally reviewed following labs and imaging studies  CBC: Recent Labs  Lab 04/08/22 1623  WBC 4.6  NEUTROABS 2.9  HGB 13.1  HCT 40.5  MCV 84.7  PLT 74*   Basic Metabolic Panel: Recent Labs  Lab 04/08/22 1623  NA 137  K 3.8  CL 108  CO2 22  GLUCOSE 103*  BUN 19  CREATININE 0.64  CALCIUM 9.1   GFR: CrCl cannot be calculated (Unknown ideal weight.). Liver Function Tests: No results for input(s): "AST", "ALT", "ALKPHOS", "BILITOT", "PROT", "ALBUMIN" in the last 168 hours. No results for input(s): "LIPASE", "AMYLASE" in the last 168 hours. No results for input(s): "AMMONIA" in the last 168 hours. Coagulation Profile: No results for input(s): "INR", "PROTIME" in the last 168 hours. Cardiac Enzymes: No results for input(s): "CKTOTAL", "CKMB", "CKMBINDEX", "TROPONINI" in the last 168 hours. BNP (last 3 results) No results for input(s): "PROBNP" in the last 8760 hours. HbA1C: No results for input(s): "HGBA1C" in the last 72 hours. CBG: Recent Labs  Lab 04/08/22 1537  GLUCAP 114*   Lipid Profile: No results for input(s): "CHOL", "HDL", "LDLCALC", "TRIG", "CHOLHDL",  "LDLDIRECT" in the last 72 hours. Thyroid Function Tests: No results for input(s): "TSH", "T4TOTAL", "FREET4", "T3FREE", "THYROIDAB" in the last 72 hours. Anemia Panel: No results for input(s): "VITAMINB12", "FOLATE", "FERRITIN", "TIBC", "IRON", "RETICCTPCT" in the last 72 hours. Urine analysis:    Component Value Date/Time   COLORURINE YELLOW 04/08/2022 1530   APPEARANCEUR CLEAR 04/08/2022 1530   LABSPEC 1.017 04/08/2022 1530   PHURINE 7.0 04/08/2022 1530   GLUCOSEU NEGATIVE 04/08/2022 1530   HGBUR NEGATIVE 04/08/2022 [place] 04/08/2022 1530   BILIRUBINUR negative 02/24/2022 [place] 04/08/2022 1530   PROTEINUR NEGATIVE 04/08/2022 1530   UROBILINOGEN 1.0 02/24/2022 1402   UROBILINOGEN 0.2 01/23/2021 1715   NITRITE NEGATIVE 04/08/2022 1530  LEUKOCYTESUR NEGATIVE 04/08/2022 1530    Radiological Exams on Admission: MR BRAIN W WO CONTRAST  Result Date: 04/08/2022 CLINICAL DATA:  Initial evaluation for acute ataxia. History of metastatic melanoma, status post whole-brain radiation. EXAM: MRI HEAD WITHOUT AND WITH CONTRAST TECHNIQUE: Multiplanar, multiecho pulse sequences of the brain and surrounding structures were obtained without and with intravenous contrast. CONTRAST:  6.5 cc of Vueway COMPARISON:  Prior CT from earlier the same day as well as prior brain MRI from 10/04/2020. FINDINGS: Brain: Examination degraded by motion artifact. Cerebral volume stable. Confluent T2/FLAIR hyperintensity involving the supratentorial cerebral white matter, consistent with post radiation changes. Scattered foci of susceptibility artifact, consistent with small chronic micro hemorrhages, also consistent with prior radiation therapy. Slightly more prominent area of chronic hemorrhage at the mesial right occipital lobe, also stable. Small focus of T1 hyperintensity with subtle asymmetric enhancement involving the medial right thalamus, less conspicuous as compared to  prior exam, compatible with treated disease. Similarly, previously seen subtle enhancement involving the mesial right occipital lobe is also less conspicuous, also consistent with treated disease. No other new enhancement or findings to suggest disease progression. 1.3 cm acute ischemic infarct present at the left thalamic capsular region (series 5, image 24). No associated hemorrhage or mass effect. No other evidence for acute or subacute ischemia. Gray-white matter differentiation otherwise maintained. No acute intracranial hemorrhage. Tiny remote right cerebellar infarct noted. No other mass lesion, mass effect or midline shift. No hydrocephalus or extra-axial fluid collection. Pituitary gland and suprasellar region within normal limits. No other abnormal enhancement. Vascular: Major intracranial vascular flow voids are maintained. Skull and upper cervical spine: Craniocervical junction within normal limits. Bone marrow signal intensity normal. No focal marrow replacing lesion. No scalp soft tissue abnormality. Sinuses/Orbits: Globes orbital soft tissues within normal limits. Chronic left maxillary sinusitis. Trace left mastoid effusion, of doubtful significance. Other: None. IMPRESSION: 1. 1.3 cm acute ischemic nonhemorrhagic left thalamocapsular infarct. 2. Residual subtle enhancement involving the mesial right thalamus and right occipital lobe, decreased in conspicuity as compared to prior MRI, and now only faintly visible. Findings are consistent with treated disease. No other evidence for interval disease progression or active disease elsewhere within the brain. 3. Underlying changes of chronic whole-brain radiation. Electronically Signed   By: Jeannine Boga M.D.   On: 04/08/2022 23:09   DG Pelvis 1-2 Views  Result Date: 04/08/2022 CLINICAL DATA:  [place], history of metastatic melanoma EXAM: PELVIS - 1-2 VIEW COMPARISON:  10/17/2021 FINDINGS: Single frontal view of the pelvis includes both hips.  There are no acute or destructive bony abnormalities. Stable sclerotic focus within the right iliac crest compatible with bone island. Joint spaces are well preserved. Alignment is anatomic. Soft tissues are normal. IMPRESSION: 1. Unremarkable pelvis and bilateral hips. Electronically Signed   By: Randa Ngo M.D.   On: 04/08/2022 16:07   DG Chest 2 View  Result Date: 04/08/2022 CLINICAL DATA:  51 year old female presenting with difficulty ambulating post fall. EXAM: CHEST - 2 VIEW COMPARISON:  CT of the chest abdomen and pelvis of February 10, 2022. October 16, 2021 chest radiograph. FINDINGS: Cardiomediastinal contours and hilar structures are stable. Mild cardiomegaly. No consolidation. No pleural effusion. No pneumothorax. On limited assessment no acute skeletal findings. IMPRESSION: Mild cardiomegaly without acute cardiopulmonary disease. Electronically Signed   By: Zetta Bills M.D.   On: 04/08/2022 16:07   CT Cervical Spine Wo Contrast  Result Date: 04/08/2022 CLINICAL DATA:  Neck trauma, dangerous injury mechanism (Age 106-64y). Mobility/ambulation  difficulties. EXAM: CT CERVICAL SPINE WITHOUT CONTRAST TECHNIQUE: Multidetector CT imaging of the cervical spine was performed without intravenous contrast. Multiplanar CT image reconstructions were also generated. RADIATION DOSE REDUCTION: This exam was performed according to the departmental dose-optimization program which includes automated exposure control, adjustment of the mA and/or kV according to patient size and/or use of iterative reconstruction technique. COMPARISON:  11/05/2021 neck CT. FINDINGS: Substantially motion degraded scan, limiting assessment. Alignment: Straightening of the cervical spine. No facet subluxation. Dens is well positioned between the lateral masses of C1. Mild 2 mm anterolisthesis at C4-5. Skull base and vertebrae: No acute fracture. No primary bone lesion or focal pathologic process. Soft tissues and spinal canal: No  prevertebral edema. No visible canal hematoma. Disc levels: Mild-to-moderate multilevel cervical degenerative disc disease, most prominent at C5-6. Moderate bilateral facet arthropathy. Unable to accurately assess for foraminal stenosis due to motion degradation. Upper chest: No acute abnormality. Other: Visualized mastoid air cells appear clear. No discrete thyroid nodules. No pathologically enlarged cervical nodes. IMPRESSION: 1. Substantially motion degraded scan, limiting assessment. No gross evidence of cervical spine fracture or facet subluxation. 2. Mild-to-moderate multilevel degenerative changes in the cervical spine as described. 3. Mild 2 mm anterolisthesis at C4-5, probably degenerative. Electronically Signed   By: Ilona Sorrel M.D.   On: 04/08/2022 16:06   CT Head Wo Contrast  Result Date: 04/08/2022 CLINICAL DATA:  Trauma EXAM: CT HEAD WITHOUT CONTRAST TECHNIQUE: Contiguous axial images were obtained from the base of the skull through the vertex without intravenous contrast. RADIATION DOSE REDUCTION: This exam was performed according to the departmental dose-optimization program which includes automated exposure control, adjustment of the mA and/or kV according to patient size and/or use of iterative reconstruction technique. COMPARISON:  Nov 04, 2021 CT Brain FINDINGS: Brain: Redemonstrated sequela of severe chronic microvascular ischemic change with interval development of a new hypodensity in the left internal capsule (series 3, image 17). Size and shape of the ventricular system is unchanged compared to prior exam. Redemonstrated generalized volume loss. No evidence of hemorrhage, extra-axial collection or mass lesion/mass effect. Vascular: No hyperdense vessel or unexpected calcification. Skull: Negative for fracture or focal lesion. Sinuses/Orbits: No acute finding. Other: None. IMPRESSION: 1. No CT evidence of intracranial injury. 2. New hypodensity in the left internal capsule compared to  11/04/21. Given patient's history, this is suspicious for a new infarct. Electronically Signed   By: Marin Roberts M.D.   On: 04/08/2022 16:05     EKG: Independently reviewed, with result as described above.    Assessment/Plan    Principal Problem:   Acute ischemic stroke [facility]) Active Problems:   Thrombocytopenia (HCC)   Frequent falls   Allergic rhinitis      #) Acute ischemic stroke: In the setting of new onset unsteadiness on feet, gait instability, with last known normal noted to be either at 2130 on 04/04/22 or 2130 on 04/05/22, MRI brain shows evidence of acute ischemic nonhemorrhagic left thalamic capsular infarct.  Persistence of gait ataxia appears present at this time, without overt evidence of additional objective neurologic deficit.  EDP discussed patient's case and imaging with the on-call neurologist, Dr.  Leonel Ramsay, who recommended admission to the hospitalist service at [facility] for further stroke work-up, including further assessment of potential modifiable ischemic CVA risk factors.  In the setting of patient's history of metastatic melanoma to the brain, Dr. Leonel Ramsay requests that we refrain from initiation of dual antiplatelet therapy, but rather proceed with daily baby aspirin. Neurology to  formally consult, with additional recommendations to follow.   The patient does not appear to possess any modifiable ischemic CVA risk factors on the basis of her conveyed medical/social history, although will further assess for associated modifiable ischemic CVA risk factors, as detailed below.  Not a candidate for TPA administration given that she presents well outside of the window for consideration of such. Current outpatient antiplatelet/anticoagulant regimen: None.  Current outpatient anti-lipid regimen: None.    Plan: Nursing bedside swallow evaluation x 1 now, and will not initiate oral medications or diet until the patient has passed this. Head of the bed at 30  degrees. Neuro checks per protocol. VS per protocol. Monitor on telemetry, including monitoring for atrial fibrillation as modifiable risk factor for acute ischemic CVA.  TTE without  bubble study has been ordered for the morning. CTA head and neck. Additionally, as component of evaluation of potential modifiable ischemic CVA risk factors, will also check lipid panel and A1c. PT/OT consults have been ordered for the morning.  Neurology consult, as above.  Daily baby aspirin, while specifically refraining from dual antiplatelet therapy, as further detailed above.             #) Frequent falls: Patient conveys 3 ground-level falls over the last 2 to 3 days, which appears to be as a consequence of new onset status on feet, gait instability as a predisposing factor.With a loss of consciousness, hitting of the head, or any overt acute fracture, as further detailed via the above imaging assessment.  As previously noted, she is not on any blood thinners as an outpatient.  No evidence to suggest underlying infectious contribution, including presenting urinalysis that was inconsistent with UTI.  Plan: Further evaluation management of acute ischemic stroke, as above.  Fall precautions.  PT/OT consults in the morning.  Repeat CMP and CBC in the morning.              #) Allergic Rhinitis: documented h/o such, on scheduled loratadine as outpatient in the absence of intranasal corticosteroid.   Plan: cont home loratadine.            #) Chronic thrombocytopenia: Documented history as such, dating back to at least December 2019, with presenting platelet count of 74 consistent with this baseline, and relative to most recent prior value of 67 in August 2023.  Per my preliminary chart review, underlying etiology not entirely clear at this time.  No overt evidence of active bleed.  Plan: Repeat CBC in the morning.         DVT prophylaxis: SCD's   Code Status: Full code Family  Communication: none Disposition Plan: Per Rounding Team Consults called: EDP discussed patient's case/imaging with the on-call neurologist, Dr. Leonel Ramsay , With plan for neurology to formally consult, as further detailed above;  Admission status: Inpatient at [place] DICTATION SOFTWARE WAS USED IN THE CONSTRUCTION OF THIS NOTE.   Cecil-Bishop DO [facility] [place]   04/08/2022, 11:50 PM

## 2022-04-09 ENCOUNTER — Inpatient Hospital Stay (HOSPITAL_COMMUNITY): Payer: Medicare Other

## 2022-04-09 ENCOUNTER — Other Ambulatory Visit (HOSPITAL_COMMUNITY): Payer: Medicare Other

## 2022-04-09 ENCOUNTER — Encounter (HOSPITAL_COMMUNITY): Payer: Self-pay | Admitting: Internal Medicine

## 2022-04-09 ENCOUNTER — Other Ambulatory Visit: Payer: Self-pay

## 2022-04-09 DIAGNOSIS — J320 Chronic maxillary sinusitis: Secondary | ICD-10-CM | POA: Diagnosis not present

## 2022-04-09 DIAGNOSIS — I6389 Other cerebral infarction: Secondary | ICD-10-CM | POA: Diagnosis not present

## 2022-04-09 DIAGNOSIS — I639 Cerebral infarction, unspecified: Secondary | ICD-10-CM

## 2022-04-09 DIAGNOSIS — R296 Repeated falls: Secondary | ICD-10-CM | POA: Diagnosis not present

## 2022-04-09 DIAGNOSIS — J309 Allergic rhinitis, unspecified: Secondary | ICD-10-CM | POA: Diagnosis present

## 2022-04-09 DIAGNOSIS — M47812 Spondylosis without myelopathy or radiculopathy, cervical region: Secondary | ICD-10-CM | POA: Diagnosis not present

## 2022-04-09 LAB — COMPREHENSIVE METABOLIC PANEL
ALT: 21 U/L (ref 0–44)
AST: 26 U/L (ref 15–41)
Albumin: 3.7 g/dL (ref 3.5–5.0)
Alkaline Phosphatase: 90 U/L (ref 38–126)
Anion gap: 10 (ref 5–15)
BUN: 14 mg/dL (ref 6–20)
CO2: 24 mmol/L (ref 22–32)
Calcium: 9.8 mg/dL (ref 8.9–10.3)
Chloride: 103 mmol/L (ref 98–111)
Creatinine, Ser: 0.71 mg/dL (ref 0.44–1.00)
GFR, Estimated: >60 mL/min (ref >60)
Glucose, Bld: 118 mg/dL — ABNORMAL HIGH (ref 70–99)
Potassium: 3.2 mmol/L — ABNORMAL LOW (ref 3.5–5.1)
Sodium: 137 mmol/L (ref 135–145)
Total Bilirubin: 0.6 mg/dL (ref 0.3–1.2)
Total Protein: 6.7 g/dL (ref 6.5–8.1)

## 2022-04-09 LAB — CBC WITH DIFFERENTIAL/PLATELET
Abs Immature Granulocytes: 0.02 10*3/uL (ref 0.00–0.07)
Basophils Absolute: 0 10*3/uL (ref 0.0–0.1)
Basophils Relative: 0 %
Eosinophils Absolute: 0 10*3/uL (ref 0.0–0.5)
Eosinophils Relative: 0 %
HCT: 36.9 % (ref 36.0–46.0)
Hemoglobin: 12.5 g/dL (ref 12.0–15.0)
Immature Granulocytes: 0 %
Lymphocytes Relative: 29 %
Lymphs Abs: 1.4 10*3/uL (ref 0.7–4.0)
MCH: 27.7 pg (ref 26.0–34.0)
MCHC: 33.9 g/dL (ref 30.0–36.0)
MCV: 81.6 fL (ref 80.0–100.0)
Monocytes Absolute: 0.5 10*3/uL (ref 0.1–1.0)
Monocytes Relative: 11 %
Neutro Abs: 2.9 10*3/uL (ref 1.7–7.7)
Neutrophils Relative %: 60 %
Platelets: 62 10*3/uL — ABNORMAL LOW (ref 150–400)
RBC: 4.52 MIL/uL (ref 3.87–5.11)
RDW: 13 % (ref 11.5–15.5)
WBC: 4.9 10*3/uL (ref 4.0–10.5)
nRBC: 0 % (ref 0.0–0.2)

## 2022-04-09 LAB — ECHOCARDIOGRAM COMPLETE
Area-P 1/2: 5.27 cm2
Calc EF: 56.3 %
Height: 60 in
S' Lateral: 2.6 cm
Single Plane A2C EF: 56.7 %
Single Plane A4C EF: 54.4 %
Weight: 2264.57 oz

## 2022-04-09 LAB — MAGNESIUM: Magnesium: 1.9 mg/dL (ref 1.7–2.4)

## 2022-04-09 LAB — LIPID PANEL
Cholesterol: 218 mg/dL — ABNORMAL HIGH (ref 0–200)
HDL: 38 mg/dL — ABNORMAL LOW (ref >40)
LDL Cholesterol: 152 mg/dL — ABNORMAL HIGH (ref 0–99)
Total CHOL/HDL Ratio: 5.7 RATIO
Triglycerides: 139 mg/dL (ref <150)
VLDL: 28 mg/dL (ref 0–40)

## 2022-04-09 LAB — HEMOGLOBIN A1C
Hgb A1c MFr Bld: 5.3 % (ref 4.8–5.6)
Mean Plasma Glucose: 105.41 mg/dL

## 2022-04-09 LAB — RAPID URINE DRUG SCREEN, HOSP PERFORMED
Amphetamines: NOT DETECTED
Barbiturates: NOT DETECTED
Benzodiazepines: NOT DETECTED
Cocaine: NOT DETECTED
Opiates: NOT DETECTED
Tetrahydrocannabinol: NOT DETECTED

## 2022-04-09 MED ORDER — ASPIRIN 81 MG PO TBEC
81.0000 mg | DELAYED_RELEASE_TABLET | Freq: Every day | ORAL | Status: DC
  Administered 2022-04-10 – 2022-04-13 (×4): 81 mg via ORAL
  Filled 2022-04-09 (×4): qty 1

## 2022-04-09 MED ORDER — ASPIRIN 325 MG PO TBEC
325.0000 mg | DELAYED_RELEASE_TABLET | Freq: Once | ORAL | Status: AC
  Administered 2022-04-09: 325 mg via ORAL
  Filled 2022-04-09: qty 1

## 2022-04-09 MED ORDER — ASPIRIN 81 MG PO CHEW
81.0000 mg | CHEWABLE_TABLET | Freq: Every day | ORAL | Status: DC
  Filled 2022-04-09: qty 1

## 2022-04-09 MED ORDER — CLOPIDOGREL BISULFATE 300 MG PO TABS
300.0000 mg | ORAL_TABLET | Freq: Once | ORAL | Status: DC
  Filled 2022-04-09: qty 1

## 2022-04-09 MED ORDER — LORATADINE 10 MG PO TABS
10.0000 mg | ORAL_TABLET | Freq: Every day | ORAL | Status: DC
  Administered 2022-04-09 – 2022-04-13 (×5): 10 mg via ORAL
  Filled 2022-04-09 (×5): qty 1

## 2022-04-09 MED ORDER — ROSUVASTATIN CALCIUM 20 MG PO TABS
40.0000 mg | ORAL_TABLET | Freq: Every day | ORAL | Status: DC
  Administered 2022-04-09 – 2022-04-13 (×5): 40 mg via ORAL
  Filled 2022-04-09 (×6): qty 2

## 2022-04-09 MED ORDER — LINACLOTIDE 145 MCG PO CAPS
145.0000 ug | ORAL_CAPSULE | Freq: Every day | ORAL | Status: DC
  Administered 2022-04-10 – 2022-04-13 (×4): 145 ug via ORAL
  Filled 2022-04-09 (×4): qty 1

## 2022-04-09 MED ORDER — IOHEXOL 350 MG/ML SOLN
75.0000 mL | Freq: Once | INTRAVENOUS | Status: AC | PRN
  Administered 2022-04-09: 75 mL via INTRAVENOUS

## 2022-04-09 MED ORDER — CLOPIDOGREL BISULFATE 75 MG PO TABS: 75.0000 mg | ORAL_TABLET | Freq: Every day | ORAL | Status: DC

## 2022-04-09 NOTE — Plan of Care (Signed)
  Problem: Education: Goal: Knowledge of General Education information will improve Description: Including pain rating scale, medication(s)/side effects and non-pharmacologic comfort measures 04/09/2022 0243 by Lydia Guiles, RN Outcome: Progressing 04/09/2022 0242 by Lydia Guiles, RN Outcome: Progressing   Problem: Nutrition: Goal: Adequate nutrition will be maintained 04/09/2022 0243 by Lydia Guiles, RN Outcome: Progressing 04/09/2022 0242 by Lydia Guiles, RN Outcome: Progressing   Problem: Coping: Goal: Level of anxiety will decrease 04/09/2022 0243 by Lydia Guiles, RN Outcome: Progressing 04/09/2022 0242 by Lydia Guiles, RN Outcome: Progressing   Problem: Pain Managment: Goal: General experience of comfort will improve 04/09/2022 0243 by Lydia Guiles, RN Outcome: Progressing 04/09/2022 0242 by Lydia Guiles, RN Outcome: Progressing   Problem: Safety: Goal: Ability to remain free from injury will improve 04/09/2022 0243 by Lydia Guiles, RN Outcome: Progressing 04/09/2022 0242 by Lydia Guiles, RN Outcome: Progressing   Problem: Skin Integrity: Goal: Risk for impaired skin integrity will decrease 04/09/2022 0243 by Lydia Guiles, RN Outcome: Progressing 04/09/2022 0242 by Lydia Guiles, RN Outcome: Progressing   Problem: Education: Goal: Knowledge of disease or condition will improve Outcome: Progressing   Problem: Education: Goal: Knowledge of secondary prevention will improve (SELECT ALL) Outcome: Progressing   Problem: Coping: Goal: Will identify appropriate support needs Outcome: Progressing   Problem: Self-Care: Goal: Ability to communicate needs accurately will improve Outcome: Progressing

## 2022-04-09 NOTE — Progress Notes (Signed)
  Echocardiogram 2D Echocardiogram has been performed.  Wynelle Link 04/09/2022, 9:38 AM

## 2022-04-09 NOTE — Progress Notes (Addendum)
STROKE TEAM PROGRESS NOTE   INTERVAL HISTORY No family at the bedside. Patient is laying in the bed in NAD.   Vitals:   04/09/22 0057 04/09/22 0146 04/09/22 0443 04/09/22 0754  BP: (!) 152/97 (!) 148/93 107/74 (!) 129/92  Pulse: 80 75 91 78  Resp: '16 20 17 20  '$ Temp:  98.1 F (36.7 C) 98.5 F (36.9 C) 98.1 F (36.7 C)  TempSrc:  Oral Oral   SpO2: 99% 99% 96% 95%  Weight:  64.2 kg    Height:  5' (1.524 m)     CBC:  Recent Labs  Lab 04/08/22 1623 04/09/22 0434  WBC 4.6 4.9  NEUTROABS 2.9 2.9  HGB 13.1 12.5  HCT 40.5 36.9  MCV 84.7 81.6  PLT 74* 62*   Basic Metabolic Panel:  Recent Labs  Lab 04/08/22 1623 04/09/22 0434  NA 137 137  K 3.8 3.2*  CL 108 103  CO2 22 24  GLUCOSE 103* 118*  BUN 19 14  CREATININE 0.64 0.71  CALCIUM 9.1 9.8  MG  --  1.9   Lipid Panel:  Recent Labs  Lab 04/09/22 0434  CHOL 218*  TRIG 139  HDL 38*  CHOLHDL 5.7  VLDL 28  LDLCALC 152*   HgbA1c:  Recent Labs  Lab 04/08/22 1623  HGBA1C 5.3   Urine Drug Screen: No results for input(s): "LABOPIA", "COCAINSCRNUR", "LABBENZ", "AMPHETMU", "THCU", "LABBARB" in the last 168 hours.  Alcohol Level No results for input(s): "ETH" in the last 168 hours.  IMAGING past 24 hours CT ANGIO HEAD NECK W WO CM  Result Date: 04/09/2022 CLINICAL DATA:  Follow-up examination for stroke. EXAM: CT ANGIOGRAPHY HEAD AND NECK TECHNIQUE: Multidetector CT imaging of the head and neck was performed using the standard protocol during bolus administration of intravenous contrast. Multiplanar CT image reconstructions and MIPs were obtained to evaluate the vascular anatomy. Carotid stenosis measurements (when applicable) are obtained utilizing NASCET criteria, using the distal internal carotid diameter as the denominator. RADIATION DOSE REDUCTION: This exam was performed according to the departmental dose-optimization program which includes automated exposure control, adjustment of the mA and/or kV according to  patient size and/or use of iterative reconstruction technique. CONTRAST:  79m OMNIPAQUE IOHEXOL 350 MG/ML SOLN COMPARISON:  Prior CT and MRI from earlier the same evening. FINDINGS: CT HEAD FINDINGS Brain: Previously identified left thalamic capsular infarct again noted, stable. No associated hemorrhage or mass effect. No other acute large vessel territory infarct or intracranial hemorrhage. Chronic changes of underlying whole-brain radiation again noted. No visible mass lesion by CT. No mass effect or midline shift. No hydrocephalus or extra-axial fluid collection. Vascular: No hyperdense vessel. Skull: Scalp soft tissues and calvarium demonstrate no acute finding. Sinuses/Orbits: Globes orbital soft tissues within normal limits. Chronic left maxillary sinusitis noted. No mastoid effusion. Other: None. Review of the MIP images confirms the above findings CTA NECK FINDINGS Aortic arch: Visualized aortic arch normal caliber with standard 3 vessel morphology. No stenosis about the origin the great vessels. Right carotid system: Right common and internal carotid arteries widely patent without stenosis, dissection or occlusion. Left carotid system: Left common and internal carotid arteries widely patent without stenosis, dissection or occlusion. Vertebral arteries: Both vertebral arteries arise from the subclavian arteries. No proximal subclavian artery stenosis. Both vertebral arteries widely patent without stenosis, dissection or occlusion. Skeleton: No discrete or worrisome osseous lesions. Moderate spondylosis noted at C5-6 and C6-7. Other neck: No other acute soft tissue abnormality within the neck. Upper chest:  Visualized upper chest demonstrates no acute finding. Review of the MIP images confirms the above findings CTA HEAD FINDINGS Anterior circulation: Both internal carotid arteries patent to the termini without stenosis. A1 segments patent bilaterally. 2 mm outpouching arising from the left aspect of the  anterior communicating artery complex, suspicious for a small aneurysm (series 11, image 267). Both ACAs widely patent distally. No M1 stenosis or occlusion. Normal MCA bifurcations. No proximal MCA branch occlusion. Distal MCA branches perfused and symmetric. Posterior circulation: Both V4 segments patent to the vertebrobasilar junction without stenosis. Both PICA origins patent and normal. Basilar widely patent to its distal aspect without stenosis. Superior cerebellar arteries patent bilaterally. Both PCAs primarily supplied via the basilar and are well perfused to there distal aspects. Venous sinuses: Patent allowing for timing the contrast bolus. Anatomic variants: None significant. Review of the MIP images confirms the above findings IMPRESSION: CT HEAD IMPRESSION: 1. Acute left thalamic capsular infarct, better seen on prior brain MRI. No associated hemorrhage or mass effect. 2. No other acute intracranial abnormality. 3. Underlying changes of prior whole-brain radiation, stable. 4. Chronic left maxillary sinusitis. CTA HEAD AND NECK IMPRESSION: 1. Negative CTA of the head and neck for large vessel occlusion or other emergent finding. No significant atheromatous disease for age. No hemodynamically significant or correctable stenosis. 2. 2 mm outpouching arising from the anterior communicating artery complex, suspicious for a small aneurysm. Attention at follow-up recommended. Electronically Signed   By: Jeannine Boga M.D.   On: 04/09/2022 01:07   MR BRAIN W WO CONTRAST  Result Date: 04/08/2022 CLINICAL DATA:  Initial evaluation for acute ataxia. History of metastatic melanoma, status post whole-brain radiation. EXAM: MRI HEAD WITHOUT AND WITH CONTRAST TECHNIQUE: Multiplanar, multiecho pulse sequences of the brain and surrounding structures were obtained without and with intravenous contrast. CONTRAST:  6.5 cc of Vueway COMPARISON:  Prior CT from earlier the same day as well as prior brain MRI from  10/04/2020. FINDINGS: Brain: Examination degraded by motion artifact. Cerebral volume stable. Confluent T2/FLAIR hyperintensity involving the supratentorial cerebral white matter, consistent with post radiation changes. Scattered foci of susceptibility artifact, consistent with small chronic micro hemorrhages, also consistent with prior radiation therapy. Slightly more prominent area of chronic hemorrhage at the mesial right occipital lobe, also stable. Small focus of T1 hyperintensity with subtle asymmetric enhancement involving the medial right thalamus, less conspicuous as compared to prior exam, compatible with treated disease. Similarly, previously seen subtle enhancement involving the mesial right occipital lobe is also less conspicuous, also consistent with treated disease. No other new enhancement or findings to suggest disease progression. 1.3 cm acute ischemic infarct present at the left thalamic capsular region (series 5, image 24). No associated hemorrhage or mass effect. No other evidence for acute or subacute ischemia. Gray-white matter differentiation otherwise maintained. No acute intracranial hemorrhage. Tiny remote right cerebellar infarct noted. No other mass lesion, mass effect or midline shift. No hydrocephalus or extra-axial fluid collection. Pituitary gland and suprasellar region within normal limits. No other abnormal enhancement. Vascular: Major intracranial vascular flow voids are maintained. Skull and upper cervical spine: Craniocervical junction within normal limits. Bone marrow signal intensity normal. No focal marrow replacing lesion. No scalp soft tissue abnormality. Sinuses/Orbits: Globes orbital soft tissues within normal limits. Chronic left maxillary sinusitis. Trace left mastoid effusion, of doubtful significance. Other: None. IMPRESSION: 1. 1.3 cm acute ischemic nonhemorrhagic left thalamocapsular infarct. 2. Residual subtle enhancement involving the mesial right thalamus and  right occipital lobe, decreased in conspicuity  as compared to prior MRI, and now only faintly visible. Findings are consistent with treated disease. No other evidence for interval disease progression or active disease elsewhere within the brain. 3. Underlying changes of chronic whole-brain radiation. Electronically Signed   By: Jeannine Boga M.D.   On: 04/08/2022 23:09   DG Pelvis 1-2 Views  Result Date: 04/08/2022 CLINICAL DATA:  [place], history of metastatic melanoma EXAM: PELVIS - 1-2 VIEW COMPARISON:  10/17/2021 FINDINGS: Single frontal view of the pelvis includes both hips. There are no acute or destructive bony abnormalities. Stable sclerotic focus within the right iliac crest compatible with bone island. Joint spaces are well preserved. Alignment is anatomic. Soft tissues are normal. IMPRESSION: 1. Unremarkable pelvis and bilateral hips. Electronically Signed   By: Randa Ngo M.D.   On: 04/08/2022 16:07   DG Chest 2 View  Result Date: 04/08/2022 CLINICAL DATA:  51 year old female presenting with difficulty ambulating post fall. EXAM: CHEST - 2 VIEW COMPARISON:  CT of the chest abdomen and pelvis of February 10, 2022. October 16, 2021 chest radiograph. FINDINGS: Cardiomediastinal contours and hilar structures are stable. Mild cardiomegaly. No consolidation. No pleural effusion. No pneumothorax. On limited assessment no acute skeletal findings. IMPRESSION: Mild cardiomegaly without acute cardiopulmonary disease. Electronically Signed   By: Zetta Bills M.D.   On: 04/08/2022 16:07   CT Cervical Spine Wo Contrast  Result Date: 04/08/2022 CLINICAL DATA:  Neck trauma, dangerous injury mechanism (Age 70-64y). Mobility/ambulation difficulties. EXAM: CT CERVICAL SPINE WITHOUT CONTRAST TECHNIQUE: Multidetector CT imaging of the cervical spine was performed without intravenous contrast. Multiplanar CT image reconstructions were also generated. RADIATION DOSE REDUCTION: This exam was performed according  to the departmental dose-optimization program which includes automated exposure control, adjustment of the mA and/or kV according to patient size and/or use of iterative reconstruction technique. COMPARISON:  11/05/2021 neck CT. FINDINGS: Substantially motion degraded scan, limiting assessment. Alignment: Straightening of the cervical spine. No facet subluxation. Dens is well positioned between the lateral masses of C1. Mild 2 mm anterolisthesis at C4-5. Skull base and vertebrae: No acute fracture. No primary bone lesion or focal pathologic process. Soft tissues and spinal canal: No prevertebral edema. No visible canal hematoma. Disc levels: Mild-to-moderate multilevel cervical degenerative disc disease, most prominent at C5-6. Moderate bilateral facet arthropathy. Unable to accurately assess for foraminal stenosis due to motion degradation. Upper chest: No acute abnormality. Other: Visualized mastoid air cells appear clear. No discrete thyroid nodules. No pathologically enlarged cervical nodes. IMPRESSION: 1. Substantially motion degraded scan, limiting assessment. No gross evidence of cervical spine fracture or facet subluxation. 2. Mild-to-moderate multilevel degenerative changes in the cervical spine as described. 3. Mild 2 mm anterolisthesis at C4-5, probably degenerative. Electronically Signed   By: Ilona Sorrel M.D.   On: 04/08/2022 16:06   CT Head Wo Contrast  Result Date: 04/08/2022 CLINICAL DATA:  Trauma EXAM: CT HEAD WITHOUT CONTRAST TECHNIQUE: Contiguous axial images were obtained from the base of the skull through the vertex without intravenous contrast. RADIATION DOSE REDUCTION: This exam was performed according to the departmental dose-optimization program which includes automated exposure control, adjustment of the mA and/or kV according to patient size and/or use of iterative reconstruction technique. COMPARISON:  Nov 04, 2021 CT Brain FINDINGS: Brain: Redemonstrated sequela of severe chronic  microvascular ischemic change with interval development of a new hypodensity in the left internal capsule (series 3, image 17). Size and shape of the ventricular system is unchanged compared to prior exam. Redemonstrated generalized volume loss. No  evidence of hemorrhage, extra-axial collection or mass lesion/mass effect. Vascular: No hyperdense vessel or unexpected calcification. Skull: Negative for fracture or focal lesion. Sinuses/Orbits: No acute finding. Other: None. IMPRESSION: 1. No CT evidence of intracranial injury. 2. New hypodensity in the left internal capsule compared to 11/04/21. Given patient's history, this is suspicious for a new infarct. Electronically Signed   By: Marin Roberts M.D.   On: 04/08/2022 16:05    PHYSICAL EXAM  Temp:  [97.2 F (36.2 C)-98.5 F (36.9 C)] 98.4 F (36.9 C) (10/06 1132) Pulse Rate:  [67-91] 73 (10/06 1132) Resp:  [14-20] 14 (10/06 1132) BP: (107-155)/(74-106) 152/94 (10/06 1132) SpO2:  [95 %-100 %] 97 % (10/06 1132) Weight:  [64.2 kg] 64.2 kg (10/06 0146)  General - Well nourished, well developed, in no apparent distress. Cardiovascular - Regular rhythm and rate.  Mental Status -  Level of arousal and orientation to time, place, and person were intact. Language including expression, naming, repetition, comprehension was assessed and found intact. Attention span and concentration were normal. Recent and remote memory were intact. Fund of Knowledge was assessed and was intact.  Cranial Nerves II - XII - II - Visual field intact OU. III, IV, VI - Extraocular movements intact, left eye with ptosis. V - Facial sensation intact bilaterally. VII - right droop VIII - Hearing & vestibular intact bilaterally. X - Palate elevates symmetrically. XI - Chin turning & shoulder shrug intact bilaterally. XII - Tongue protrusion intact.  Motor Strength - right arm and leg 4/5, left arm and leg 5/5  Motor Tone - Muscle tone was assessed at the neck and  appendages and was normal.  Sensory - Light touch, temperature/pinprick were assessed and were symmetrical.    Coordination - ataxia of right arm and leg  Gait and Station - deferred.   ASSESSMENT/PLAN Ms. Kaitlyn Duncan is a 51 y.o. 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 ED complaining of new onset unsteadiness on feet. She reports on Tuesday, 04/06/2022, developed new onset unsteadiness on her feet resulting in her reported gait instability.  In this context, she notes 3 ensuing ground-level falls which were not associate with any loss of consciousness, or hitting of the head.   Stroke: Acute left PLIC infarct, likely due to prior whole brain radiation treatment  vs small vessel disease  CT head Acute left thalamic capsular infarct, better seen on prior brain MRI. No associated hemorrhage or mass effect. CTA head & neck unremarkable MRI  1.3 cm acute ischemic nonhemorrhagic left thalamocapsular infarct. 2D Echo EF 55-60% LDL 152 HgbA1c 5.3 VTE prophylaxis - SCD's  No antithrombotic prior to admission, now on aspirin 81 mg daily.  No DAPT given thrombocytopenia Therapy recommendations: CIR Disposition:  pending  Hypertension Home meds:  none Stable Long-term BP goal normotensive  Hyperlipidemia Home meds:  none LDL 152, goal < 70 Add crestor '40mg'$    Continue statin at discharge  Hx of melanoma with brain metastasis, in remission Diagnosed in 01/2018 Status post chemotherapy and whole brain radiation Serial MRI showed residual subtle enhancement involving the mesial right thalamus and right occipital lobe, decreased in conspicuity as compared to prior MRI, and now only faintly visible. Findings  are consistent with treated disease. No other  evidence for interval disease progression or active disease elsewhere within the brain. Continue follow-up with neuro-oncology Dr. Mickeal Skinner  Other Active Problems Chronic thrombocytopenia, platelets 74-> 62.  Okay on aspirin 81, against DAPT.  Neurology will sign off. Please call with questions or concerns  Hospital day # 1  Beulah Gandy DNP, ACNPC-AG   ATTENDING NOTE: I reviewed above note and agree with the assessment and plan. Pt was seen and examined.   51 year old female with history of melanoma with brain metastasis status post chemo and whole brain radiation, anemia, thrombocytopenia admitted for right-sided weakness.  CT showed possible acute stroke left internal capsule.  MRI confirmed left posterior limb internal capsule infarct, stable treated brain metastasis lesion.  CT head and neck unremarkable.  EF 55 to 60%, A1c 5.3, LDL 152, creatinine 0.71, platelet 74-> 62.  On exam, no family at bedside.  Patient sitting in chair for lunch.  Psychomotor slowing with mild dysarthria, however orientated x3, awake alert, no aphasia, follows simple commands in a delayed fashion.  Mild right facial droop, visual field full, no gaze palsy.  Left upper extremity proximal 4+/5, distal finger grip 4/5.  Bilateral lower extremity 4/5.  Sensation symmetrical, finger-to-nose grossly intact bilaterally.  Etiology for patient stroke likely due to whole brain radiation induced small vessel disease.  Put on aspirin 81, against DAPT given thrombocytopenia.  Put on Crestor 40.  PT therapy recommend CIR.  Patient need to continue follow-up with Dr. Mickeal Skinner neuro-oncology.  For detailed assessment and plan, please refer to above/below as I have made changes wherever appropriate.   Neurology will sign off. Please call with questions. Pt will follow up with stroke clinic NP at [facility] in about 4 weeks. Thanks for the consult.   Rosalin Hawking, MD PhD Stroke  Neurology 04/09/2022 6:22 PM    To contact Stroke Continuity provider, please refer to [email or web address] After hours, contact General Neurology

## 2022-04-09 NOTE — Evaluation (Signed)
Occupational Therapy Evaluation Patient Details Name: Kaitlyn Duncan MRN: [id number] DOB: Oct 31, 1970 Today's Date: 04/09/2022   History of Present Illness Pt is a 51 y/o female who presents 04/08/22 with R sided weakness. MRI revealed L acute ischemic nonhemorrhagic L thalamocapsular infarct.  PMH significant for anemia, melanoma with brain mets s/p whole brain radiation.   Clinical Impression   PTA patient reports independent with ADLs, mobility but needing more support and furniture walking recently.  She does not drive, but manages her medications.  Poor historian and difficult to follow at times.  She completes ADLs with up to min-mod assist, transfers with min assist and mobility in room with up to min assist.  She relies on UE support during mobility and standing tasks. She is oriented, is able to follow simple commands with increased time but poor problem solving, awareness and safety noted. Unsure how accurate her history is, she reports living alone in a hotel.  Based on performance today, believe she will be able to progress to mod I level with intensive AIR level rehab.  Will follow acutely.      Recommendations for follow up therapy are one component of a multi-disciplinary discharge planning process, led by the attending physician.  Recommendations may be updated based on patient status, additional functional criteria and insurance authorization.   Follow Up Recommendations  Acute inpatient rehab (3hours/day)    Assistance Recommended at Discharge Frequent or constant Supervision/Assistance  Patient can return home with the following A little help with walking and/or transfers;A little help with bathing/dressing/bathroom;Assistance with cooking/housework;Direct supervision/assist for medications management;Direct supervision/assist for financial management;Assist for transportation;Help with stairs or ramp for entrance    Functional Status Assessment  Patient has had a recent  decline in their functional status and demonstrates the ability to make significant improvements in function in a reasonable and predictable amount of time.  Equipment Recommendations  Other (comment) (defer)    Recommendations for Other Services Rehab consult;Speech consult     Precautions / Restrictions Precautions Precautions: Fall Restrictions Weight Bearing Restrictions: No      Mobility Bed Mobility               General bed mobility comments: pt recieved in bathroom, handoff from NT    Transfers Overall transfer level: Needs assistance   Transfers: Sit to/from Stand Sit to Stand: Min assist           General transfer comment: Light assist to power up to full stand 2 unsteadiness, pt reaching for support upon standing      Balance Overall balance assessment: Needs assistance Sitting-balance support: Feet supported, No upper extremity supported Sitting balance-Leahy Scale: Fair Sitting balance - Comments: min assist dynamically for LB dressing   Standing balance support: Bilateral upper extremity supported, During functional activity, Reliant on assistive device for balance, Single extremity supported Standing balance-Leahy Scale: Poor Standing balance comment: relies on external support                           ADL either performed or assessed with clinical judgement   ADL Overall ADL's : Needs assistance/impaired     Grooming: Min guard;Standing;Wash/dry hands           Upper Body Dressing : Set up;Sitting   Lower Body Dressing: Minimal assistance;Sit to/from stand Lower Body Dressing Details (indicate cue type and reason): dynamically for balance during donning/doffing socks, min guard in standing Toilet Transfer: Ambulation;Minimal assistance Toilet Transfer Details (  indicate cue type and reason): for safety, relies on UE support         Functional mobility during ADLs: Min guard       Vision Baseline Vision/History: 0  No visual deficits Ability to See in Adequate Light: 0 Adequate Patient Visual Report: No change from baseline Additional Comments: appears WFL during ADL tasks, able to read clock and scan appropriately. continue assessment     Perception     Praxis      Pertinent Vitals/Pain Pain Assessment Pain Assessment: Faces Faces Pain Scale: Hurts a little bit Pain Location: General grimacing and discomfort throughout mobility. Pain Descriptors / Indicators: Grimacing Pain Intervention(s): Limited activity within patient's tolerance, Monitored during session, Repositioned     Hand Dominance Right   Extremity/Trunk Assessment Upper Extremity Assessment Upper Extremity Assessment: RUE deficits/detail RUE Deficits / Details: mild decreased strength, reports numbness in hand; coordination appears functional RUE Sensation: decreased light touch RUE Coordination: WNL   Lower Extremity Assessment Lower Extremity Assessment: Defer to PT evaluation RLE Deficits / Details: Decreased strength consistent with above mentioned diagnosis. Grossly 4/5 strength RLE Sensation: WNL   Cervical / Trunk Assessment Cervical / Trunk Assessment: Other exceptions Cervical / Trunk Exceptions: Forward head posture with rounded shoulders   Communication Communication Communication: No difficulties   Cognition Arousal/Alertness: Awake/alert Behavior During Therapy: WFL for tasks assessed/performed Overall Cognitive Status: Impaired/Different from baseline Area of Impairment: Attention, Following commands, Memory, Safety/judgement, Awareness, Problem solving                   Current Attention Level: Focused Memory: Decreased short-term memory Following Commands: Follows one step commands consistently, Follows one step commands with increased time, Follows multi-step commands inconsistently Safety/Judgement: Decreased awareness of safety, Decreased awareness of deficits Awareness: Emergent Problem  Solving: Slow processing, Difficulty sequencing, Requires verbal cues General Comments: pt sequencing self care tasks with increased time, min cueing for problem solving and safety.  Decreased recall noted.  Poor historian, initaly voicing L sided weakness but then voicing R sided weakness. she is oriented, but requires redirection due to poor attention.  Question baseline cognitive level     General Comments  VSS    Exercises     Shoulder Instructions      Home Living Family/patient expects to be discharged to:: Private residence Living Arrangements: Alone Available Help at Discharge:  (none) Type of Home: Other(Comment) (hotel) Home Access: Level entry     Home Layout: One level     Bathroom Shower/Tub: Teacher, early years/pre: Standard     Home Equipment: None   Additional Comments: Pt was previously living in an extended-stay hotel for 3 years PTA, however cannot return and family is not able/willing to care for pt      Prior Functioning/Environment Prior Level of Function : Patient poor historian/Family not available;Independent/Modified Independent;History of Falls (last six months)             Mobility Comments: reports needing to hold on to furniture lately ADLs Comments: reports independent, not driving but able to manage her medications        OT Problem List: Decreased strength;Decreased activity tolerance;Impaired balance (sitting and/or standing);Decreased coordination;Decreased cognition;Decreased safety awareness;Decreased knowledge of use of DME or AE;Decreased knowledge of precautions;Impaired sensation      OT Treatment/Interventions: Self-care/ADL training;Therapeutic exercise;DME and/or AE instruction;Therapeutic activities;Patient/family education;Balance training;Cognitive remediation/compensation;Neuromuscular education    OT Goals(Current goals can be found in the care plan section) Acute Rehab OT Goals Patient  Stated Goal: none  stated Time For Goal Achievement: 04/23/22 Potential to Achieve Goals: Fair  OT Frequency: Min 2X/week    Co-evaluation              AM-PAC OT "6 Clicks" Daily Activity     Outcome Measure Help from another person eating meals?: A Little Help from another person taking care of personal grooming?: A Little Help from another person toileting, which includes using toliet, bedpan, or urinal?: A Little Help from another person bathing (including washing, rinsing, drying)?: A Lot Help from another person to put on and taking off regular upper body clothing?: A Little Help from another person to put on and taking off regular lower body clothing?: A Little 6 Click Score: 17   End of Session Equipment Utilized During Treatment: Gait belt Nurse Communication: Mobility status  Activity Tolerance: Patient tolerated treatment well Patient left: with call bell/phone within reach;Other (comment) (PT present, sitting EOB)  OT Visit Diagnosis: Other abnormalities of gait and mobility (R26.89);Muscle weakness (generalized) (M62.81);Other symptoms and signs involving cognitive function                Time: 2440-1027 OT Time Calculation (min): 15 min Charges:  OT General Charges $OT Visit: 1 Visit OT Evaluation $OT Eval Moderate Complexity: 1 Mod  Kaitlyn Duncan, OT Acute Rehabilitation Services Office [phone]   Delight Stare 04/09/2022, 1:45 PM

## 2022-04-09 NOTE — Progress Notes (Signed)
   Inpatient Rehab Admissions Coordinator :  Per therapy recommendations, patient was screened for CIR candidacy by Danne Baxter RN MSN.  At this time patient appears to be a potential candidate for CIR. Noted lack of caregiver supports and may have a legal guardian per review of past admits and ER visits. I will place a rehab consult per protocol for full assessment. Please call me with any questions.  Danne Baxter RN MSN Admissions Coordinator [phone]

## 2022-04-09 NOTE — Progress Notes (Signed)
   04/09/22 1225  Clinical Encounter Type  Visited With Patient  Visit Type Spiritual support;Initial  Referral From Nurse  Consult/Referral To Chaplain Melvenia Beam)  Recommendations Patient Request Prayer  Spiritual Encounters  Spiritual Needs Emotional;Prayer   Chaplain responded to Spiritual Care Consultation Request: "Patient Requests Prayer." Met with Ms. Kaitlyn Duncan "[name]" at patient's bedside. Patient discussed several falls and right sided weakness that she has experienced during the past several days along with stroke symptoms that led up to her hospital admission admission. [name] presents to be very frightened by the diagnosis as she "is only 51 years old." Chaplain provided compassionate meaningful presence and shared reflective listening about her life story. Patient is originally from [place]. and does not have any family that lives nearby. She says that she does have case workers that check on her regularly where she is living (Not clear, but it sounded like a group home setting). At her request we shared in intercessory prayer for healing, comfort, peace and [name]'s presence. [address] [place], [place] [phone]

## 2022-04-09 NOTE — TOC Initial Note (Addendum)
Transition of Care [facility]) - Initial/Assessment Note    Patient Details  Name: Kaitlyn Duncan MRN: [id number] Date of Birth: [birth year]  Transition of [facility]) CM/SW Contact:    Pollie Friar, RN Phone Number: 04/09/2022, 3:59 PM  Clinical Narrative:                 CM met with the patient in order to try an obtain information about her living situation. She answered very limited questions. CM has left voicemail for her legal guardian: Vincente Liberty with [facility].  Pt does say she lives in a motel. She denies any DME.  CM unable at this time to complete the SDOH due to pt confusion and not wanting to answer questions.  Recommendations are for CIR. Pt most likely will require SNF rehab.  TOC following.  8469: Vincente Liberty (legal guardian) called back and pt has been managing on her own. She doesn't take any meds except supplements as pt refuse (even mental health meds).  She schedules her own transportation through: act team, legal guardian, [employer] transport or cab.  Vincente Liberty states she will not have 24 hour support at d/c.  Vincente Liberty: [phone]  Expected Discharge Plan: Skilled Nursing Facility Barriers to Discharge: Continued Medical Work up   Patient Goals and CMS Choice   CMS Medicare.gov Compare Post Acute Care list provided to:: Legal Guardian Choice offered to / list presented to : [facility] POA / Guardian  Expected Discharge Plan and Services Expected Discharge Plan: [place]   Discharge Planning Services: CM Consult Post Acute Care Choice: [place] Living arrangements for the past 2 months: Hotel/Motel                                      Prior Living Arrangements/Services Living arrangements for the past 2 months: Hotel/Motel Lives with:: Self Patient language and need for interpreter reviewed:: Yes Do you feel safe going back to the place where you live?: Yes            Criminal Activity/Legal Involvement Pertinent to  Current Situation/Hospitalization: No - Comment as needed  Activities of Daily Living Home Assistive Devices/Equipment: None ADL Screening (condition at time of admission) Patient's cognitive ability adequate to safely complete daily activities?: Yes Is the patient deaf or have difficulty hearing?: No Does the patient have difficulty seeing, even when wearing glasses/contacts?: No Does the patient have difficulty concentrating, remembering, or making decisions?: No Patient able to express need for assistance with ADLs?: Yes Does the patient have difficulty dressing or bathing?: No Independently performs ADLs?: Yes (appropriate for developmental age) Does the patient have difficulty walking or climbing stairs?: No Weakness of Legs: None Weakness of Arms/Hands: None  Permission Sought/Granted                  Emotional Assessment Appearance:: Appears stated age     Orientation: : Oriented to Self, Oriented to Place   Psych Involvement: No (comment)  Admission diagnosis:  Acute ischemic stroke ([place]) [I63.9] Acute CVA (cerebrovascular accident) [facility]) [I63.9] Patient Active Problem List   Diagnosis Date Noted   Frequent falls 04/09/2022   Allergic rhinitis 04/09/2022   Acute ischemic stroke ([place]) 04/08/2022   Prediabetes 12/24/2021   Frequent urination 12/24/2021   Schizophrenia ([place]) 09/25/2021   Leukopenia 06/27/2021   Thrombocytopenia ([place]) 06/27/2021   Schizophrenia spectrum disorder with psychotic disorder type not yet determined ([place])  06/26/2021   Has difficulty accessing primary care provider for most visits 06/15/2021   Malingering 01/23/2021   Candidiasis of skin 01/23/2021   Musculoskeletal pain 01/23/2021   Psychosis ([place])    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   Melanoma of skin ([place]) 05/29/2018   Goals of care, counseling/discussion 05/29/2018   Malignant neoplasm metastatic to brain ([place]) 01/26/2018   PCP:   [name], NP-C Pharmacy:   [facility] [place] [address] Phone: [phone] Fax: [phone]  [facility], [place]. [place]. [place] [address] Phone: [phone] Fax: [phone]     Social Determinants of Health (SDOH) Interventions    Readmission Risk Interventions     No data to display

## 2022-04-09 NOTE — Evaluation (Signed)
Physical Therapy Evaluation  Patient Details Name: Kaitlyn Duncan MRN: [id number] DOB: [birth year] Today's Date: 04/09/2022  History of Present Illness  Pt is a 51 y/o female who presents 04/08/22 with R sided weakness. MRI revealed L acute ischemic nonhemorrhagic L thalamocapsular infarct.  PMH significant for anemia, melanoma with brain mets s/p whole brain radiation.   Clinical Impression  Pt admitted with above diagnosis. Pt currently with functional limitations due to the deficits listed below (see PT Problem List). At the time of PT eval pt was able to perform transfers and ambulation with gross min guard assist to min assist and RW for support. Pt with cognitive deficits at this time and unsure how accurate history is that pt provided. It appears she has been living in a hotel and has intermittent assistance for errands such as grocery shopping. I do feel this patient could progress to a mod I level with intensive, multidisciplinary rehab. Recommending this venue to maximize safety at d/c and facilitate d/c back to her prior residence.      Recommendations for follow up therapy are one component of a multi-disciplinary discharge planning process, led by the attending physician.  Recommendations may be updated based on patient status, additional functional criteria and insurance authorization.  Follow Up Recommendations Acute inpatient rehab (3hours/day)      Assistance Recommended at Discharge Frequent or constant Supervision/Assistance  Patient can return home with the following  A little help with walking and/or transfers;A little help with bathing/dressing/bathroom;Assistance with cooking/housework;Assist for transportation    Equipment Recommendations Rolling walker (2 wheels)  Recommendations for Other Services  Rehab consult    Functional Status Assessment Patient has had a recent decline in their functional status and demonstrates the ability to make significant  improvements in function in a reasonable and predictable amount of time.     Precautions / Restrictions Precautions Precautions: Fall Restrictions Weight Bearing Restrictions: No      Mobility  Bed Mobility               General bed mobility comments: Pt was received sitting up EOB    Transfers Overall transfer level: Needs assistance Equipment used: Rolling walker (2 wheels) Transfers: Sit to/from Stand Sit to Stand: Min assist           General transfer comment: Light assist to power up to full stand 2 unsteadiness and need for walker management. VC's for hand placement on seated surface for safety.    Ambulation/Gait Ambulation/Gait assistance: Min guard, Min assist Gait Distance (Feet): 150 Feet Assistive device: Rolling walker (2 wheels) Gait Pattern/deviations: Step-through pattern, Decreased stride length, Trunk flexed, Drifts right/left Gait velocity: Decreased Gait velocity interpretation: <1.31 ft/sec, indicative of household ambulator   General Gait Details: Pt drifting R, with R side inattention and difficulty way finding in hall. In tighter spaces, pt running into obstacles on the right with RW.  Stairs            Wheelchair Mobility    Modified Rankin (Stroke Patients Only) Modified Rankin (Stroke Patients Only) Pre-Morbid Rankin Score: Slight disability Modified Rankin: Moderately severe disability     Balance Overall balance assessment: Needs assistance Sitting-balance support: Feet supported, No upper extremity supported Sitting balance-Leahy Scale: Fair     Standing balance support: Bilateral upper extremity supported, During functional activity, Reliant on assistive device for balance Standing balance-Leahy Scale: Poor                 High Level Balance Comments: With  head turns pt veering both R and L in hall             Pertinent Vitals/Pain Pain Assessment Pain Assessment: Faces Faces Pain Scale: Hurts a  little bit Pain Location: General grimacing and discomfort throughout mobility. Pain Descriptors / Indicators: Grimacing Pain Intervention(s): Limited activity within patient's tolerance, Monitored during session, Repositioned    Home Living Family/patient expects to be discharged to:: Private residence Living Arrangements: Alone Available Help at Discharge:  (none) Type of Home: Other(Comment) (hotel) Home Access: Level entry       Home Layout: One level Home Equipment: None Additional Comments: Pt was previously living in an extended-stay hotel for 3 years PTA, however cannot return and family is not able/willing to care for pt    Prior Function Prior Level of Function : Patient poor historian/Family not available;Independent/Modified Independent;History of Falls (last six months)             Mobility Comments: reports needing to hold on to furniture lately       Hand Dominance   Dominant Hand: Right    Extremity/Trunk Assessment   Upper Extremity Assessment Upper Extremity Assessment: Defer to OT evaluation    Lower Extremity Assessment Lower Extremity Assessment: RLE deficits/detail RLE Deficits / Details: Decreased strength consistent with above mentioned diagnosis. Grossly 4/5 strength RLE Sensation: WNL    Cervical / Trunk Assessment Cervical / Trunk Assessment: Other exceptions Cervical / Trunk Exceptions: Forward head posture with rounded shoulders  Communication   Communication: No difficulties  Cognition Arousal/Alertness: Awake/alert Behavior During Therapy: WFL for tasks assessed/performed Overall Cognitive Status: Impaired/Different from baseline Area of Impairment: Attention, Following commands, Memory, Safety/judgement, Awareness, Problem solving                   Current Attention Level: Focused Memory: Decreased short-term memory Following Commands: Follows one step commands consistently, Follows one step commands with increased time,  Follows multi-step commands inconsistently Safety/Judgement: Decreased awareness of safety, Decreased awareness of deficits Awareness: Emergent Problem Solving: Slow processing, Difficulty sequencing, Requires verbal cues          General Comments      Exercises     Assessment/Plan    PT Assessment Patient needs continued PT services  PT Problem List Decreased strength;Decreased range of motion;Decreased activity tolerance;Decreased balance;Decreased mobility;Decreased knowledge of use of DME;Decreased safety awareness;Decreased knowledge of precautions;Decreased coordination;Decreased cognition       PT Treatment Interventions DME instruction;Gait training;Functional mobility training;Therapeutic activities;Therapeutic exercise;Balance training;Neuromuscular re-education;Cognitive remediation;Patient/family education    PT Goals (Current goals can be found in the Care Plan section)  Acute Rehab PT Goals Patient Stated Goal: Get her phone charged PT Goal Formulation: With patient Time For Goal Achievement: 04/16/22 Potential to Achieve Goals: Good    Frequency Min 4X/week     Co-evaluation               AM-PAC PT "6 Clicks" Mobility  Outcome Measure Help needed turning from your back to your side while in a flat bed without using bedrails?: A Little Help needed moving from lying on your back to sitting on the side of a flat bed without using bedrails?: A Little Help needed moving to and from a bed to a chair (including a wheelchair)?: A Little Help needed standing up from a chair using your arms (e.g., wheelchair or bedside chair)?: A Little Help needed to walk in hospital room?: A Little Help needed climbing 3-5 steps with a railing? : A  Little 6 Click Score: 18    End of Session Equipment Utilized During Treatment: Gait belt Activity Tolerance: Patient tolerated treatment well Patient left: in chair;with call bell/phone within reach;with chair alarm set Nurse  Communication: Mobility status PT Visit Diagnosis: Unsteadiness on feet (R26.81);Other symptoms and signs involving the nervous system (R29.898);Difficulty in walking, not elsewhere classified (R26.2)    Time: 9449-6759 PT Time Calculation (min) (ACUTE ONLY): 20 min   Charges:   PT Evaluation $PT Eval Moderate Complexity: 1 Mod          Rolinda Roan, PT, DPT Acute Rehabilitation Services Secure Chat Preferred Office: [phone]   Thelma Comp 04/09/2022, 1:17 PM

## 2022-04-09 NOTE — Progress Notes (Signed)
PROGRESS NOTE    Kaitlyn Duncan  KDT:[id number] DOB: 08/17/1970 DOA: 04/08/2022 PCP: [name], NP-C   Brief Narrative:  Kaitlyn Duncan is a 51 y.o. female who woke up Tuesday morning with right-sided weakness.  She states that this been fairly stable since that time.  Due to the symptoms, she sought care in the emergency department last night where an MRI was performed demonstrating a subcortical stroke on the left.  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  - Ongoing ambulatory dysfunction - PT to follow  - 37 asa only per neuro recs - SLP/PO/OT following - resume home meds - advance diet - Echo 55-60%; A1C wnl, Lipid panel - elevated LDL/Chol  Ambulatory dysfunction, secondary to above PT to follow  Incidental ACA 63m outpouching, concerning for early aneurysm 2 mm outpouching arising from the anterior communicating artery Outpatient follow-up/imaging per PCP  Allergic Rhinitis:  Continue antihistamine    Chronic thrombocytopenia:  Follow clinically, no signs/symptoms of bleeding   DVT prophylaxis: None given thrombocytopenia as above - asa and early ambulation Code Status: Full Family Communication: none present  Status is: Inpt  Dispo: The patient is from: Currently living in a motel              Anticipated d/c is to: TBD              Anticipated d/c date is: 24-48h              Patient currently NOT medically stable for discharge  Consultants:  Neuro  Procedures:  None  Antimicrobials:  None    Subjective: No acute issues/events overnight  Objective: Vitals:   04/09/22 0057 04/09/22 0146 04/09/22 0443 04/09/22 0754  BP: (!) 152/97 (!) 148/93 107/74 (!) 129/92  Pulse: 80 75 91 78  Resp: '16 20 17 20  '$ Temp:  98.1 F (36.7 C) 98.5 F (36.9 C) 98.1 F (36.7 C)  TempSrc:  Oral Oral   SpO2: 99% 99% 96% 95%  Weight:  64.2 kg    Height:  5' (1.524 m)      Intake/Output Summary (Last 24 hours) at 04/09/2022 0814 Last data filed at 04/09/2022 0225 Gross per 24 hour  Intake 600 ml  Output --  Net 600 ml   Filed Weights   04/09/22 0146  Weight: 64.2 kg    Examination:  General exam: Appears calm and comfortable  Respiratory system: Clear to auscultation. Respiratory effort normal. Cardiovascular system: S1 & S2 heard, RRR. No JVD, murmurs, rubs, gallops or clicks. No pedal edema. Gastrointestinal system: Abdomen is nondistended, soft and nontender. No organomegaly or masses felt. Normal bowel sounds heard. Central nervous system: Alert and oriented. No focal neurological deficits. Extremities: Symmetric 5 x 5 power. Skin: No rashes, lesions or ulcers Psychiatry: Judgement and insight appear normal. Mood & affect appropriate.   Data Reviewed: I have personally reviewed following labs and imaging studies  CBC: Recent Labs  Lab 04/08/22 1623 04/09/22 0434  WBC 4.6 4.9  NEUTROABS 2.9 2.9  HGB 13.1 12.5  HCT 40.5 36.9  MCV  84.7 81.6  PLT 74* 62*   Basic Metabolic Panel: Recent Labs  Lab 04/08/22 1623 04/09/22 0434  NA 137 137  K 3.8 3.2*  CL 108 103  CO2 22 24  GLUCOSE 103* 118*  BUN 19 14  CREATININE 0.64 0.71  CALCIUM 9.1 9.8  MG  --  1.9   GFR: Estimated Creatinine Clearance: 68 mL/min (by C-G formula based on SCr of 0.71 mg/dL). Liver Function Tests: Recent Labs  Lab 04/09/22 0434  AST 26  ALT 21  ALKPHOS 90  BILITOT 0.6  PROT 6.7  ALBUMIN 3.7   No results for input(s): "LIPASE", "AMYLASE" in the last 168 hours. No results for  input(s): "AMMONIA" in the last 168 hours. Coagulation Profile: No results for input(s): "INR", "PROTIME" in the last 168 hours. Cardiac Enzymes: No results for input(s): "CKTOTAL", "CKMB", "CKMBINDEX", "TROPONINI" in the last 168 hours. BNP (last 3 results) No results for input(s): "PROBNP" in the last 8760 hours. HbA1C: Recent Labs    04/08/22 1623  HGBA1C 5.3   CBG: Recent Labs  Lab 04/08/22 1537  GLUCAP 114*   Lipid Profile: Recent Labs    04/09/22 0434  CHOL 218*  HDL 38*  LDLCALC 152*  TRIG 139  CHOLHDL 5.7   Thyroid Function Tests: No results for input(s): "TSH", "T4TOTAL", "FREET4", "T3FREE", "THYROIDAB" in the last 72 hours. Anemia Panel: No results for input(s): "VITAMINB12", "FOLATE", "FERRITIN", "TIBC", "IRON", "RETICCTPCT" in the last 72 hours. Sepsis Labs: No results for input(s): "PROCALCITON", "LATICACIDVEN" in the last 168 hours.  No results found for this or any previous visit (from the past 240 hour(s)).       Radiology Studies: CT ANGIO HEAD NECK W WO CM  Result Date: 04/09/2022 CLINICAL DATA:  Follow-up examination for stroke. EXAM: CT ANGIOGRAPHY HEAD AND NECK TECHNIQUE: Multidetector CT imaging of the head and neck was performed using the standard protocol during bolus administration of intravenous contrast. Multiplanar CT image reconstructions and MIPs were obtained to evaluate the vascular anatomy. Carotid stenosis measurements (when applicable) are obtained utilizing NASCET criteria, using the distal internal carotid diameter as the denominator. RADIATION DOSE REDUCTION: This exam was performed according to the departmental dose-optimization program which includes automated exposure control, adjustment of the mA and/or kV according to patient size and/or use of iterative reconstruction technique. CONTRAST:  43m OMNIPAQUE IOHEXOL 350 MG/ML SOLN COMPARISON:  Prior CT and MRI from earlier the same evening. FINDINGS: CT HEAD FINDINGS Brain:  Previously identified left thalamic capsular infarct again noted, stable. No associated hemorrhage or mass effect. No other acute large vessel territory infarct or intracranial hemorrhage. Chronic changes of underlying whole-brain radiation again noted. No visible mass lesion by CT. No mass effect or midline shift. No hydrocephalus or extra-axial fluid collection. Vascular: No hyperdense vessel. Skull: Scalp soft tissues and calvarium demonstrate no acute finding. Sinuses/Orbits: Globes orbital soft tissues within normal limits. Chronic left maxillary sinusitis noted. No mastoid effusion. Other: None. Review of the MIP images confirms the above findings CTA NECK FINDINGS Aortic arch: Visualized aortic arch normal caliber with standard 3 vessel morphology. No stenosis about the origin the great vessels. Right carotid system: Right common and internal carotid arteries widely patent without stenosis, dissection or occlusion. Left carotid system: Left common and internal carotid arteries widely patent without stenosis, dissection or occlusion. Vertebral arteries: Both vertebral arteries arise from the subclavian arteries. No proximal subclavian artery stenosis. Both vertebral arteries widely patent without stenosis, dissection or occlusion. Skeleton: No discrete or  worrisome osseous lesions. Moderate spondylosis noted at C5-6 and C6-7. Other neck: No other acute soft tissue abnormality within the neck. Upper chest: Visualized upper chest demonstrates no acute finding. Review of the MIP images confirms the above findings CTA HEAD FINDINGS Anterior circulation: Both internal carotid arteries patent to the termini without stenosis. A1 segments patent bilaterally. 2 mm outpouching arising from the left aspect of the anterior communicating artery complex, suspicious for a small aneurysm (series 11, image 267). Both ACAs widely patent distally. No M1 stenosis or occlusion. Normal MCA bifurcations. No proximal MCA branch  occlusion. Distal MCA branches perfused and symmetric. Posterior circulation: Both V4 segments patent to the vertebrobasilar junction without stenosis. Both PICA origins patent and normal. Basilar widely patent to its distal aspect without stenosis. Superior cerebellar arteries patent bilaterally. Both PCAs primarily supplied via the basilar and are well perfused to there distal aspects. Venous sinuses: Patent allowing for timing the contrast bolus. Anatomic variants: None significant. Review of the MIP images confirms the above findings IMPRESSION: CT HEAD IMPRESSION: 1. Acute left thalamic capsular infarct, better seen on prior brain MRI. No associated hemorrhage or mass effect. 2. No other acute intracranial abnormality. 3. Underlying changes of prior whole-brain radiation, stable. 4. Chronic left maxillary sinusitis. CTA HEAD AND NECK IMPRESSION: 1. Negative CTA of the head and neck for large vessel occlusion or other emergent finding. No significant atheromatous disease for age. No hemodynamically significant or correctable stenosis. 2. 2 mm outpouching arising from the anterior communicating artery complex, suspicious for a small aneurysm. Attention at follow-up recommended. Electronically Signed   By: Jeannine Boga M.D.   On: 04/09/2022 01:07   MR BRAIN W WO CONTRAST  Result Date: 04/08/2022 CLINICAL DATA:  Initial evaluation for acute ataxia. History of metastatic melanoma, status post whole-brain radiation. EXAM: MRI HEAD WITHOUT AND WITH CONTRAST TECHNIQUE: Multiplanar, multiecho pulse sequences of the brain and surrounding structures were obtained without and with intravenous contrast. CONTRAST:  6.5 cc of Vueway COMPARISON:  Prior CT from earlier the same day as well as prior brain MRI from 10/04/2020. FINDINGS: Brain: Examination degraded by motion artifact. Cerebral volume stable. Confluent T2/FLAIR hyperintensity involving the supratentorial cerebral white matter, consistent with post  radiation changes. Scattered foci of susceptibility artifact, consistent with small chronic micro hemorrhages, also consistent with prior radiation therapy. Slightly more prominent area of chronic hemorrhage at the mesial right occipital lobe, also stable. Small focus of T1 hyperintensity with subtle asymmetric enhancement involving the medial right thalamus, less conspicuous as compared to prior exam, compatible with treated disease. Similarly, previously seen subtle enhancement involving the mesial right occipital lobe is also less conspicuous, also consistent with treated disease. No other new enhancement or findings to suggest disease progression. 1.3 cm acute ischemic infarct present at the left thalamic capsular region (series 5, image 24). No associated hemorrhage or mass effect. No other evidence for acute or subacute ischemia. Gray-white matter differentiation otherwise maintained. No acute intracranial hemorrhage. Tiny remote right cerebellar infarct noted. No other mass lesion, mass effect or midline shift. No hydrocephalus or extra-axial fluid collection. Pituitary gland and suprasellar region within normal limits. No other abnormal enhancement. Vascular: Major intracranial vascular flow voids are maintained. Skull and upper cervical spine: Craniocervical junction within normal limits. Bone marrow signal intensity normal. No focal marrow replacing lesion. No scalp soft tissue abnormality. Sinuses/Orbits: Globes orbital soft tissues within normal limits. Chronic left maxillary sinusitis. Trace left mastoid effusion, of doubtful significance. Other: None. IMPRESSION: 1. 1.3 cm  acute ischemic nonhemorrhagic left thalamocapsular infarct. 2. Residual subtle enhancement involving the mesial right thalamus and right occipital lobe, decreased in conspicuity as compared to prior MRI, and now only faintly visible. Findings are consistent with treated disease. No other evidence for interval disease progression or  active disease elsewhere within the brain. 3. Underlying changes of chronic whole-brain radiation. Electronically Signed   By: Jeannine Boga M.D.   On: 04/08/2022 23:09   DG Pelvis 1-2 Views  Result Date: 04/08/2022 CLINICAL DATA:  [place], history of metastatic melanoma EXAM: PELVIS - 1-2 VIEW COMPARISON:  10/17/2021 FINDINGS: Single frontal view of the pelvis includes both hips. There are no acute or destructive bony abnormalities. Stable sclerotic focus within the right iliac crest compatible with bone island. Joint spaces are well preserved. Alignment is anatomic. Soft tissues are normal. IMPRESSION: 1. Unremarkable pelvis and bilateral hips. Electronically Signed   By: Randa Ngo M.D.   On: 04/08/2022 16:07   DG Chest 2 View  Result Date: 04/08/2022 CLINICAL DATA:  51 year old female presenting with difficulty ambulating post fall. EXAM: CHEST - 2 VIEW COMPARISON:  CT of the chest abdomen and pelvis of February 10, 2022. October 16, 2021 chest radiograph. FINDINGS: Cardiomediastinal contours and hilar structures are stable. Mild cardiomegaly. No consolidation. No pleural effusion. No pneumothorax. On limited assessment no acute skeletal findings. IMPRESSION: Mild cardiomegaly without acute cardiopulmonary disease. Electronically Signed   By: Zetta Bills M.D.   On: 04/08/2022 16:07   CT Cervical Spine Wo Contrast  Result Date: 04/08/2022 CLINICAL DATA:  Neck trauma, dangerous injury mechanism (Age 27-64y). Mobility/ambulation difficulties. EXAM: CT CERVICAL SPINE WITHOUT CONTRAST TECHNIQUE: Multidetector CT imaging of the cervical spine was performed without intravenous contrast. Multiplanar CT image reconstructions were also generated. RADIATION DOSE REDUCTION: This exam was performed according to the departmental dose-optimization program which includes automated exposure control, adjustment of the mA and/or kV according to patient size and/or use of iterative reconstruction technique.  COMPARISON:  11/05/2021 neck CT. FINDINGS: Substantially motion degraded scan, limiting assessment. Alignment: Straightening of the cervical spine. No facet subluxation. Dens is well positioned between the lateral masses of C1. Mild 2 mm anterolisthesis at C4-5. Skull base and vertebrae: No acute fracture. No primary bone lesion or focal pathologic process. Soft tissues and spinal canal: No prevertebral edema. No visible canal hematoma. Disc levels: Mild-to-moderate multilevel cervical degenerative disc disease, most prominent at C5-6. Moderate bilateral facet arthropathy. Unable to accurately assess for foraminal stenosis due to motion degradation. Upper chest: No acute abnormality. Other: Visualized mastoid air cells appear clear. No discrete thyroid nodules. No pathologically enlarged cervical nodes. IMPRESSION: 1. Substantially motion degraded scan, limiting assessment. No gross evidence of cervical spine fracture or facet subluxation. 2. Mild-to-moderate multilevel degenerative changes in the cervical spine as described. 3. Mild 2 mm anterolisthesis at C4-5, probably degenerative. Electronically Signed   By: Ilona Sorrel M.D.   On: 04/08/2022 16:06   CT Head Wo Contrast  Result Date: 04/08/2022 CLINICAL DATA:  Trauma EXAM: CT HEAD WITHOUT CONTRAST TECHNIQUE: Contiguous axial images were obtained from the base of the skull through the vertex without intravenous contrast. RADIATION DOSE REDUCTION: This exam was performed according to the departmental dose-optimization program which includes automated exposure control, adjustment of the mA and/or kV according to patient size and/or use of iterative reconstruction technique. COMPARISON:  Nov 04, 2021 CT Brain FINDINGS: Brain: Redemonstrated sequela of severe chronic microvascular ischemic change with interval development of a new hypodensity in the left internal capsule (  series 3, image 17). Size and shape of the ventricular system is unchanged compared to prior  exam. Redemonstrated generalized volume loss. No evidence of hemorrhage, extra-axial collection or mass lesion/mass effect. Vascular: No hyperdense vessel or unexpected calcification. Skull: Negative for fracture or focal lesion. Sinuses/Orbits: No acute finding. Other: None. IMPRESSION: 1. No CT evidence of intracranial injury. 2. New hypodensity in the left internal capsule compared to 11/04/21. Given patient's history, this is suspicious for a new infarct. Electronically Signed   By: Marin Roberts M.D.   On: 04/08/2022 16:05        Scheduled Meds:   stroke: early stages of recovery book   Does not apply Once   aspirin  81 mg Oral Daily   clopidogrel  300 mg Oral Once   And   [START ON 04/10/2022] clopidogrel  75 mg Oral Daily   loratadine  10 mg Oral Daily   Continuous Infusions:   LOS: 1 day    Time spent: 40mn  Carles Florea C Zanae Kuehnle, DO [facility]  If 7PM-7AM, please contact night-coverage [email or web address]  04/09/2022, 8:14 AM

## 2022-04-09 NOTE — Consult Note (Signed)
Neurology Consultation Reason for Consult: Stroke Referring Physician: Velia Meyer, J  CC: Right-sided weakness  History is obtained from: Patient  HPI: Theora Vankirk is a 51 y.o. female who woke up Tuesday morning with right-sided weakness.  She states that this been fairly stable since that time.  Due to the symptoms, she sought care in the emergency department last night where an MRI was performed demonstrating a subcortical stroke on the left.   LKW: Monday night tpa given?: no, out of window  Past Medical History:  Diagnosis Date   Allergy    Anemia    Gallstones    GERD (gastroesophageal reflux disease)    Hemorrhoids    melanoma with met dz dx'd 01/2018   brain and adrenal   Seasonal allergies      Family History  Problem Relation Age of Onset   Melanoma Mother    Hypertension Father    Breast cancer Maternal Aunt    Breast cancer Paternal Aunt    Diabetes Paternal Aunt    Breast cancer Maternal Grandmother    Breast cancer Paternal Grandmother    Diabetes Paternal Grandmother    Colon cancer Neg Hx    Esophageal cancer Neg Hx    Pancreatic cancer Neg Hx    Stomach cancer Neg Hx    Liver disease Neg Hx    Rectal cancer Neg Hx      Social History:  reports that she has never smoked. She has never used smokeless tobacco. She reports that she does not drink alcohol and does not use drugs.   Exam: Current vital signs: BP 107/74 (BP Location: Left Arm)   Pulse 91   Temp 98.5 F (36.9 C) (Oral)   Resp 17   Ht 5' (1.524 m)   Wt 64.2 kg   SpO2 96%   BMI 27.64 kg/m  Vital signs in last 24 hours: Temp:  [97.2 F (36.2 C)-98.5 F (36.9 C)] 98.5 F (36.9 C) (10/06 0443) Pulse Rate:  [67-91] 91 (10/06 0443) Resp:  [16-20] 17 (10/06 0443) BP: (107-155)/(74-106) 107/74 (10/06 0443) SpO2:  [96 %-100 %] 96 % (10/06 0443) Weight:  [64.2 kg] 64.2 kg (10/06 0146)   Physical Exam  Constitutional: Appears well-developed and well-nourished.  Psych:  Affect appropriate to situation Eyes: No scleral injection HENT: No OP obstruction MSK: no joint deformities.  Cardiovascular: Normal rate and regular rhythm.  Respiratory: Effort normal, non-labored breathing GI: Soft.  No distension. There is no tenderness.  Skin: WDI  Neuro: Mental Status: Patient is awake, alert, oriented to person, place, month, year, and situation. Patient is able to give a clear and coherent history. No signs of neglect Her speech is slightly slow, but this may be because I am examining her in the wee hours in the morning, she is able to name and repeat. Cranial Nerves: II: Visual Fields are full. Pupils are equal, round, and reactive to light.   III,IV, VI: EOMI without ptosis or diploplia.  V: Facial sensation is symmetric to temperature VII: Facial movement is weak on the right VIII: hearing is intact to voice X: Uvula elevates symmetrically XI: Shoulder shrug is symmetric. XII: tongue is midline without atrophy or fasciculations.  Motor: Tone is normal. Bulk is normal. 5/5 strength was present in the left arm and leg, 4/5 weakness of the right arm, 4+/5 in the right leg Sensory: Sensation is symmetric to light touch and temperature in the arms and legs. Cerebellar: She has discoordination out of proportion  to weakness in the right arm and leg   I have reviewed labs in epic and the results pertinent to this consultation are: CMP is unremarkable LDL of 152  I have reviewed the images obtained: MRI brain-subcortical stroke on the left CTA-no LVO  Impression: 51 year old female with a history of metastatic melanoma and whole brain radiation therapy who presents with subcortical stroke on the left.  It is slightly large for looking, but I do wonder if it is related to her previous radiation.  She will need assessment for secondary risk factor modification and physical therapy.  Recommendations: - HgbA1c -High-dose statin for LDL of 152 - MRI of the  brain without contrast - Frequent neuro checks - Echocardiogram - Prophylactic therapy-Antiplatelet med: Aspirin - dose $Remo'81mg'NhRiO$  and plavix $RemoveBe'75mg'miAQUPGQm$  daily  after $Remo'300mg'luKkz$  load  - Risk factor modification - Telemetry monitoring - PT consult, OT consult, Speech consult - Stroke team to follow    Roland Rack, MD [facility] [phone]  If 7pm- 7am, please page neurology on call as listed in [place].

## 2022-04-10 DIAGNOSIS — J309 Allergic rhinitis, unspecified: Secondary | ICD-10-CM | POA: Diagnosis not present

## 2022-04-10 LAB — BASIC METABOLIC PANEL
Anion gap: 11 (ref 5–15)
BUN: 11 mg/dL (ref 6–20)
CO2: 20 mmol/L — ABNORMAL LOW (ref 22–32)
Calcium: 8.9 mg/dL (ref 8.9–10.3)
Chloride: 101 mmol/L (ref 98–111)
Creatinine, Ser: 0.66 mg/dL (ref 0.44–1.00)
GFR, Estimated: >60 mL/min (ref >60)
Glucose, Bld: 96 mg/dL (ref 70–99)
Potassium: 3.5 mmol/L (ref 3.5–5.1)
Sodium: 132 mmol/L — ABNORMAL LOW (ref 135–145)

## 2022-04-10 LAB — CBC
HCT: 36.2 % (ref 36.0–46.0)
Hemoglobin: 12.3 g/dL (ref 12.0–15.0)
MCH: 28 pg (ref 26.0–34.0)
MCHC: 34 g/dL (ref 30.0–36.0)
MCV: 82.3 fL (ref 80.0–100.0)
Platelets: 64 10*3/uL — ABNORMAL LOW (ref 150–400)
RBC: 4.4 MIL/uL (ref 3.87–5.11)
RDW: 12.8 % (ref 11.5–15.5)
WBC: 4.6 10*3/uL (ref 4.0–10.5)
nRBC: 0 % (ref 0.0–0.2)

## 2022-04-10 MED ORDER — OXYBUTYNIN CHLORIDE 5 MG PO TABS
5.0000 mg | ORAL_TABLET | Freq: Once | ORAL | Status: AC
  Administered 2022-04-10: 5 mg via ORAL
  Filled 2022-04-10: qty 1

## 2022-04-10 NOTE — Progress Notes (Signed)
PROGRESS NOTE    Kenetha Cozza  ALP:[id number] DOB: April 08, 1971 DOA: 04/08/2022 PCP: [name], NP-C   Brief Narrative:  Kaitlyn Duncan is a 51 y.o. female who woke up Tuesday morning with right-sided weakness.  She states that this been fairly stable since that time.  Due to the symptoms, she sought care in the emergency department last night where an MRI was performed demonstrating a subcortical stroke on the left.  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  - Ongoing ambulatory dysfunction - PT to follow  - 63 asa only per neuro recs (hold off on dual antiplatelet therapy given thrombocytopenia) - SLP/PO/OT following - resume home meds - advance diet - Echo 55-60%; A1C wnl, Lipid panel - elevated LDL/Chol -continue Crestor 40  Ambulatory dysfunction, secondary to above PT to follow  Incidental ACA 98m outpouching, concerning for early aneurysm 2 mm outpouching arising from the anterior communicating artery Outpatient follow-up/imaging per PCP  Allergic Rhinitis:  Continue antihistamine    Chronic thrombocytopenia:  Follow clinically, no signs/symptoms of bleeding   DVT prophylaxis: None given thrombocytopenia as above - asa and early ambulation Code Status: Full Family Communication: none present  Status is: Inpt  Dispo: The patient is from: Currently living in a motel              Anticipated d/c is to: TBD              Anticipated d/c date is: 24-48h              Patient currently NOT medically stable for  discharge  Consultants:  Neuro  Procedures:  None  Antimicrobials:  None   Subjective: No acute issues/events overnight  Objective: Vitals:   04/09/22 2006 04/09/22 2352 04/10/22 0442 04/10/22 0716  BP: (!) 150/111 136/85 (!) 134/93 (!) 139/101  Pulse:  81 81 73  Resp:  '14 12 20  '$ Temp: 98.7 F (37.1 C) 99.1 F (37.3 C) 97.9 F (36.6 C) 97.6 F (36.4 C)  TempSrc: Oral Axillary Oral Oral  SpO2: 98% 99% 97% 96%  Weight:      Height:        Intake/Output Summary (Last 24 hours) at 04/10/2022 0805 Last data filed at 04/09/2022 1830 Gross per 24 hour  Intake 477 ml  Output --  Net 477 ml    Filed Weights   04/09/22 0146  Weight: 64.2 kg    Examination:  General exam: Appears calm and comfortable  Respiratory system: Clear to auscultation. Respiratory effort normal. Cardiovascular system: S1 & S2 heard, RRR. No JVD, murmurs, rubs, gallops or clicks. No pedal edema. Gastrointestinal system: Abdomen is nondistended, soft and nontender. No organomegaly or masses felt. Normal bowel sounds heard. Central nervous system: Alert and oriented. No focal neurological deficits. Extremities: Symmetric 5 x 5 power. Skin: No rashes, lesions or ulcers Psychiatry: Judgement and insight appear normal. Mood & affect appropriate.   Data Reviewed: I have personally reviewed following labs and imaging studies  CBC: Recent Labs  Lab 04/08/22 1623 04/09/22 0434 04/10/22 0600  WBC 4.6 4.9 4.6  NEUTROABS 2.9 2.9  --   HGB 13.1 12.5 12.3  HCT 40.5 36.9 36.2  MCV 84.7 81.6 82.3  PLT 74* 62* 64*    Basic Metabolic Panel: Recent Labs  Lab 04/08/22 1623 04/09/22 0434 04/10/22 0600  NA 137 137 132*  K 3.8 3.2* 3.5  CL 108 103 101  CO2 22 24 20*  GLUCOSE 103* 118* 96  BUN '19 14 11  '$ CREATININE 0.64 0.71 0.66  CALCIUM 9.1 9.8 8.9  MG  --  1.9  --     GFR: Estimated Creatinine Clearance: 68 mL/min (by C-G formula based on SCr of 0.66 mg/dL). Liver Function Tests: Recent  Labs  Lab 04/09/22 0434  AST 26  ALT 21  ALKPHOS 90  BILITOT 0.6  PROT 6.7  ALBUMIN 3.7    No results for input(s): "LIPASE", "AMYLASE" in the last 168 hours. No results for input(s): "AMMONIA" in the last 168 hours. Coagulation Profile: No results for input(s): "INR", "PROTIME" in the last 168 hours. Cardiac Enzymes: No results for input(s): "CKTOTAL", "CKMB", "CKMBINDEX", "TROPONINI" in the last 168 hours. BNP (last 3 results) No results for input(s): "PROBNP" in the last 8760 hours. HbA1C: Recent Labs    04/08/22 1623  HGBA1C 5.3    CBG: Recent Labs  Lab 04/08/22 1537  GLUCAP 114*    Lipid Profile: Recent Labs    04/09/22 0434  CHOL 218*  HDL 38*  LDLCALC 152*  TRIG 139  CHOLHDL 5.7    Thyroid Function Tests: No results for input(s): "TSH", "T4TOTAL", "FREET4", "T3FREE", "THYROIDAB" in the last 72 hours. Anemia Panel: No results for input(s): "VITAMINB12", "FOLATE", "FERRITIN", "TIBC", "IRON", "RETICCTPCT" in the last 72 hours. Sepsis Labs: No results for input(s): "PROCALCITON", "LATICACIDVEN" in the last 168 hours.  No results found for this or any previous visit (from the past 240 hour(s)).       Radiology Studies: ECHOCARDIOGRAM COMPLETE  Result Date: 04/09/2022    ECHOCARDIOGRAM REPORT   Patient Name:   Kaitlyn Duncan [facility] Date of Exam: 04/09/2022 Medical Rec #:  [id number]             Height:       60.0 in Accession #:    [id number]            Weight:       141.5 lb Date of Birth:  06-27-1971              BSA:          1.612 m Patient Age:    16 years              BP:           129/92 mmHg Patient Gender: F                     HR:           78 bpm. Exam Location:  Inpatient Procedure: 2D Echo, Color Doppler and Cardiac Doppler Indications:    Stroke  History:        Patient has prior history of Echocardiogram examinations, most                 recent 05/26/2018. Stroke; Risk Factors:Non-Smoker.  Sonographer:    Bernadene Person RDCS Sonographer#2:   Greer Pickerel Referring Phys: [id number] Rhetta Mura  Sonographer Comments: Image acquisition challenging due to respiratory motion. IMPRESSIONS  1. Left ventricular ejection fraction, by estimation, is 55 to 60%. The  left ventricle has normal function. The left ventricle has no regional wall motion abnormalities. Left ventricular diastolic parameters were normal.  2. Right ventricular systolic function is normal. The right ventricular size is normal.  3. The mitral valve is normal in structure. Trivial mitral valve regurgitation.  4. The aortic valve is tricuspid. Aortic valve regurgitation is not visualized. No aortic stenosis is present.  5. The inferior vena cava is normal in size with greater than 50% respiratory variability, suggesting right atrial pressure of 3 mmHg. Comparison(s): Compared to prior TTE in 2019, there is no significant change. Conclusion(s)/Recommendation(s): No intracardiac source of embolism detected on this transthoracic study. Consider a transesophageal echocardiogram to exclude cardiac source of embolism if clinically indicated. FINDINGS  Left Ventricle: Left ventricular ejection fraction, by estimation, is 55 to 60%. The left ventricle has normal function. The left ventricle has no regional wall motion abnormalities. The left ventricular internal cavity size was normal in size. There is  no left ventricular hypertrophy. Left ventricular diastolic parameters were normal. Right Ventricle: The right ventricular size is normal. No increase in right ventricular wall thickness. Right ventricular systolic function is normal. Left Atrium: Left atrial size was normal in size. Right Atrium: Right atrial size was normal in size. Pericardium: There is no evidence of pericardial effusion. Mitral Valve: The mitral valve is normal in structure. Trivial mitral valve regurgitation. Tricuspid Valve: The tricuspid valve is normal in structure. Tricuspid valve regurgitation is trivial. Aortic Valve: The  aortic valve is tricuspid. Aortic valve regurgitation is not visualized. No aortic stenosis is present. Pulmonic Valve: The pulmonic valve was normal in structure. Pulmonic valve regurgitation is trivial. Aorta: The aortic root and ascending aorta are structurally normal, with no evidence of dilitation. Venous: The inferior vena cava is normal in size with greater than 50% respiratory variability, suggesting right atrial pressure of 3 mmHg. IAS/Shunts: The atrial septum is grossly normal.  LEFT VENTRICLE PLAX 2D LVIDd:         4.10 cm     Diastology LVIDs:         2.60 cm     LV e' medial:    8.37 cm/s LV PW:         0.80 cm     LV E/e' medial:  7.5 LV IVS:        0.90 cm     LV e' lateral:   9.87 cm/s LVOT diam:     1.90 cm     LV E/e' lateral: 6.4 LV SV:         51 LV SV Index:   32 LVOT Area:     2.84 cm  LV Volumes (MOD) LV vol d, MOD A2C: 64.9 ml LV vol d, MOD A4C: 91.7 ml LV vol s, MOD A2C: 28.1 ml LV vol s, MOD A4C: 41.8 ml LV SV MOD A2C:     36.8 ml LV SV MOD A4C:     91.7 ml LV SV MOD BP:      44.2 ml RIGHT VENTRICLE RV S prime:     11.10 cm/s TAPSE (M-mode): 2.0 cm LEFT ATRIUM             Index        RIGHT ATRIUM          Index LA diam:        2.90 cm 1.80 cm/m   RA Area:     9.60 cm LA Vol (A2C):   28.7 ml 17.81 ml/m  RA Volume:   16.90 ml 10.49 ml/m LA Vol (A4C):   43.7 ml 27.12 ml/m LA Biplane Vol: 36.6 ml 22.71 ml/m  AORTIC VALVE LVOT Vmax:   89.90 cm/s LVOT Vmean:  61.600 cm/s LVOT VTI:    0.181 m  AORTA Ao Root diam: 2.90 cm Ao Asc diam:  3.10 cm MITRAL VALVE MV Area (PHT): 5.27 cm    SHUNTS MV Decel Time: 144 msec    Systemic VTI:  0.18 m MV E velocity: 62.80 cm/s  Systemic Diam: 1.90 cm MV A velocity: 42.30 cm/s MV E/A ratio:  1.48 Gwyndolyn Kaufman MD Electronically signed by Gwyndolyn Kaufman MD Signature Date/Time: 04/09/2022/11:04:44 AM    Final    CT ANGIO HEAD NECK W WO CM  Result Date: 04/09/2022 CLINICAL DATA:  Follow-up examination for stroke. EXAM: CT ANGIOGRAPHY HEAD AND NECK  TECHNIQUE: Multidetector CT imaging of the head and neck was performed using the standard protocol during bolus administration of intravenous contrast. Multiplanar CT image reconstructions and MIPs were obtained to evaluate the vascular anatomy. Carotid stenosis measurements (when applicable) are obtained utilizing NASCET criteria, using the distal internal carotid diameter as the denominator. RADIATION DOSE REDUCTION: This exam was performed according to the departmental dose-optimization program which includes automated exposure control, adjustment of the mA and/or kV according to patient size and/or use of iterative reconstruction technique. CONTRAST:  37m OMNIPAQUE IOHEXOL 350 MG/ML SOLN COMPARISON:  Prior CT and MRI from earlier the same evening. FINDINGS: CT HEAD FINDINGS Brain: Previously identified left thalamic capsular infarct again noted, stable. No associated hemorrhage or mass effect. No other acute large vessel territory infarct or intracranial hemorrhage. Chronic changes of underlying whole-brain radiation again noted. No visible mass lesion by CT. No mass effect or midline shift. No hydrocephalus or extra-axial fluid collection. Vascular: No hyperdense vessel. Skull: Scalp soft tissues and calvarium demonstrate no acute finding. Sinuses/Orbits: Globes orbital soft tissues within normal limits. Chronic left maxillary sinusitis noted. No mastoid effusion. Other: None. Review of the MIP images confirms the above findings CTA NECK FINDINGS Aortic arch: Visualized aortic arch normal caliber with standard 3 vessel morphology. No stenosis about the origin the great vessels. Right carotid system: Right common and internal carotid arteries widely patent without stenosis, dissection or occlusion. Left carotid system: Left common and internal carotid arteries widely patent without stenosis, dissection or occlusion. Vertebral arteries: Both vertebral arteries arise from the subclavian arteries. No proximal  subclavian artery stenosis. Both vertebral arteries widely patent without stenosis, dissection or occlusion. Skeleton: No discrete or worrisome osseous lesions. Moderate spondylosis noted at C5-6 and C6-7. Other neck: No other acute soft tissue abnormality within the neck. Upper chest: Visualized upper chest demonstrates no acute finding. Review of the MIP images confirms the above findings CTA HEAD FINDINGS Anterior circulation: Both internal carotid arteries patent to the termini without stenosis. A1 segments patent bilaterally. 2 mm outpouching arising from the left aspect of the anterior communicating artery complex, suspicious for a small aneurysm (series 11, image 267). Both ACAs widely patent distally. No M1 stenosis or occlusion. Normal MCA bifurcations. No proximal MCA branch occlusion. Distal MCA branches perfused and symmetric. Posterior circulation: Both V4 segments patent to the vertebrobasilar junction without stenosis. Both PICA origins patent and normal. Basilar widely patent to its distal aspect without stenosis. Superior cerebellar arteries patent bilaterally. Both PCAs primarily supplied via the basilar and are well perfused to there distal aspects. Venous sinuses: Patent allowing for timing the contrast bolus. Anatomic variants: None significant.  Review of the MIP images confirms the above findings IMPRESSION: CT HEAD IMPRESSION: 1. Acute left thalamic capsular infarct, better seen on prior brain MRI. No associated hemorrhage or mass effect. 2. No other acute intracranial abnormality. 3. Underlying changes of prior whole-brain radiation, stable. 4. Chronic left maxillary sinusitis. CTA HEAD AND NECK IMPRESSION: 1. Negative CTA of the head and neck for large vessel occlusion or other emergent finding. No significant atheromatous disease for age. No hemodynamically significant or correctable stenosis. 2. 2 mm outpouching arising from the anterior communicating artery complex, suspicious for a small  aneurysm. Attention at follow-up recommended. Electronically Signed   By: Jeannine Boga M.D.   On: 04/09/2022 01:07   MR BRAIN W WO CONTRAST  Result Date: 04/08/2022 CLINICAL DATA:  Initial evaluation for acute ataxia. History of metastatic melanoma, status post whole-brain radiation. EXAM: MRI HEAD WITHOUT AND WITH CONTRAST TECHNIQUE: Multiplanar, multiecho pulse sequences of the brain and surrounding structures were obtained without and with intravenous contrast. CONTRAST:  6.5 cc of Vueway COMPARISON:  Prior CT from earlier the same day as well as prior brain MRI from 10/04/2020. FINDINGS: Brain: Examination degraded by motion artifact. Cerebral volume stable. Confluent T2/FLAIR hyperintensity involving the supratentorial cerebral white matter, consistent with post radiation changes. Scattered foci of susceptibility artifact, consistent with small chronic micro hemorrhages, also consistent with prior radiation therapy. Slightly more prominent area of chronic hemorrhage at the mesial right occipital lobe, also stable. Small focus of T1 hyperintensity with subtle asymmetric enhancement involving the medial right thalamus, less conspicuous as compared to prior exam, compatible with treated disease. Similarly, previously seen subtle enhancement involving the mesial right occipital lobe is also less conspicuous, also consistent with treated disease. No other new enhancement or findings to suggest disease progression. 1.3 cm acute ischemic infarct present at the left thalamic capsular region (series 5, image 24). No associated hemorrhage or mass effect. No other evidence for acute or subacute ischemia. Gray-white matter differentiation otherwise maintained. No acute intracranial hemorrhage. Tiny remote right cerebellar infarct noted. No other mass lesion, mass effect or midline shift. No hydrocephalus or extra-axial fluid collection. Pituitary gland and suprasellar region within normal limits. No other  abnormal enhancement. Vascular: Major intracranial vascular flow voids are maintained. Skull and upper cervical spine: Craniocervical junction within normal limits. Bone marrow signal intensity normal. No focal marrow replacing lesion. No scalp soft tissue abnormality. Sinuses/Orbits: Globes orbital soft tissues within normal limits. Chronic left maxillary sinusitis. Trace left mastoid effusion, of doubtful significance. Other: None. IMPRESSION: 1. 1.3 cm acute ischemic nonhemorrhagic left thalamocapsular infarct. 2. Residual subtle enhancement involving the mesial right thalamus and right occipital lobe, decreased in conspicuity as compared to prior MRI, and now only faintly visible. Findings are consistent with treated disease. No other evidence for interval disease progression or active disease elsewhere within the brain. 3. Underlying changes of chronic whole-brain radiation. Electronically Signed   By: Jeannine Boga M.D.   On: 04/08/2022 23:09   DG Pelvis 1-2 Views  Result Date: 04/08/2022 CLINICAL DATA:  [place], history of metastatic melanoma EXAM: PELVIS - 1-2 VIEW COMPARISON:  10/17/2021 FINDINGS: Single frontal view of the pelvis includes both hips. There are no acute or destructive bony abnormalities. Stable sclerotic focus within the right iliac crest compatible with bone island. Joint spaces are well preserved. Alignment is anatomic. Soft tissues are normal. IMPRESSION: 1. Unremarkable pelvis and bilateral hips. Electronically Signed   By: Randa Ngo M.D.   On: 04/08/2022 16:07   DG  Chest 2 View  Result Date: 04/08/2022 CLINICAL DATA:  51 year old female presenting with difficulty ambulating post fall. EXAM: CHEST - 2 VIEW COMPARISON:  CT of the chest abdomen and pelvis of February 10, 2022. October 16, 2021 chest radiograph. FINDINGS: Cardiomediastinal contours and hilar structures are stable. Mild cardiomegaly. No consolidation. No pleural effusion. No pneumothorax. On limited assessment no  acute skeletal findings. IMPRESSION: Mild cardiomegaly without acute cardiopulmonary disease. Electronically Signed   By: Zetta Bills M.D.   On: 04/08/2022 16:07   CT Cervical Spine Wo Contrast  Result Date: 04/08/2022 CLINICAL DATA:  Neck trauma, dangerous injury mechanism (Age 41-64y). Mobility/ambulation difficulties. EXAM: CT CERVICAL SPINE WITHOUT CONTRAST TECHNIQUE: Multidetector CT imaging of the cervical spine was performed without intravenous contrast. Multiplanar CT image reconstructions were also generated. RADIATION DOSE REDUCTION: This exam was performed according to the departmental dose-optimization program which includes automated exposure control, adjustment of the mA and/or kV according to patient size and/or use of iterative reconstruction technique. COMPARISON:  11/05/2021 neck CT. FINDINGS: Substantially motion degraded scan, limiting assessment. Alignment: Straightening of the cervical spine. No facet subluxation. Dens is well positioned between the lateral masses of C1. Mild 2 mm anterolisthesis at C4-5. Skull base and vertebrae: No acute fracture. No primary bone lesion or focal pathologic process. Soft tissues and spinal canal: No prevertebral edema. No visible canal hematoma. Disc levels: Mild-to-moderate multilevel cervical degenerative disc disease, most prominent at C5-6. Moderate bilateral facet arthropathy. Unable to accurately assess for foraminal stenosis due to motion degradation. Upper chest: No acute abnormality. Other: Visualized mastoid air cells appear clear. No discrete thyroid nodules. No pathologically enlarged cervical nodes. IMPRESSION: 1. Substantially motion degraded scan, limiting assessment. No gross evidence of cervical spine fracture or facet subluxation. 2. Mild-to-moderate multilevel degenerative changes in the cervical spine as described. 3. Mild 2 mm anterolisthesis at C4-5, probably degenerative. Electronically Signed   By: Ilona Sorrel M.D.   On:  04/08/2022 16:06   CT Head Wo Contrast  Result Date: 04/08/2022 CLINICAL DATA:  Trauma EXAM: CT HEAD WITHOUT CONTRAST TECHNIQUE: Contiguous axial images were obtained from the base of the skull through the vertex without intravenous contrast. RADIATION DOSE REDUCTION: This exam was performed according to the departmental dose-optimization program which includes automated exposure control, adjustment of the mA and/or kV according to patient size and/or use of iterative reconstruction technique. COMPARISON:  Nov 04, 2021 CT Brain FINDINGS: Brain: Redemonstrated sequela of severe chronic microvascular ischemic change with interval development of a new hypodensity in the left internal capsule (series 3, image 17). Size and shape of the ventricular system is unchanged compared to prior exam. Redemonstrated generalized volume loss. No evidence of hemorrhage, extra-axial collection or mass lesion/mass effect. Vascular: No hyperdense vessel or unexpected calcification. Skull: Negative for fracture or focal lesion. Sinuses/Orbits: No acute finding. Other: None. IMPRESSION: 1. No CT evidence of intracranial injury. 2. New hypodensity in the left internal capsule compared to 11/04/21. Given patient's history, this is suspicious for a new infarct. Electronically Signed   By: Marin Roberts M.D.   On: 04/08/2022 16:05        Scheduled Meds:  aspirin EC  81 mg Oral Daily   linaclotide  145 mcg Oral QAC breakfast   loratadine  10 mg Oral Daily   rosuvastatin  40 mg Oral Daily   Continuous Infusions:   LOS: 2 days    Time spent: 48mn  Deklyn Gibbon C Jatasia Gundrum, DO [facility]  If 7PM-7AM, please contact night-coverage [email or web address]  04/10/2022, 8:05 AM

## 2022-04-10 NOTE — Progress Notes (Signed)
Inpatient Rehab Admissions:  Inpatient Rehab Consult received.  I met with patient at the bedside for rehabilitation assessment and to discuss goals and expectations of an inpatient rehab admission.  Discussed insurance authorization requirement for her insurance, average length of stay, discharge home after completion of CIR. Pt acknowledged understanding. Pt interested in pursuing CIR. Pt acknowledged that she is unsure if anyone would be able to provide support after discharge. Pt gave permission to contact legal guardian [name]. Called [facility]ne and no one answered. Not able to leave a message. Will continue to follow.  Signed: Gayland Curry, [place], [place] Admissions Coordinator [phone]

## 2022-04-10 NOTE — Progress Notes (Signed)
Physical Therapy Treatment Patient Details Name: Kaitlyn Duncan MRN: [id number] DOB: 06/22/1971 Today's Date: 04/10/2022   History of Present Illness Pt is a 51 y/o female who presents 04/08/22 with R sided weakness. MRI revealed L acute ischemic nonhemorrhagic L thalamocapsular infarct.  PMH significant for anemia, melanoma with brain mets s/p whole brain radiation.    PT Comments    Pt progressing towards goals. Wanting to limit mobility to the room as her breakfast was delivered and pt wanting to eat. Pt with difficulty navigating obstacles, especially on the R and required assist throughout. Current recommendations for AIR therapies appropriate as intensive therapies will aid pt in reaching a mod I level. Will continue to follow acutely.     Recommendations for follow up therapy are one component of a multi-disciplinary discharge planning process, led by the attending physician.  Recommendations may be updated based on patient status, additional functional criteria and insurance authorization.  Follow Up Recommendations  Acute inpatient rehab (3hours/day)     Assistance Recommended at Discharge Frequent or constant Supervision/Assistance  Patient can return home with the following A little help with walking and/or transfers;A little help with bathing/dressing/bathroom;Assistance with cooking/housework;Assist for transportation   Equipment Recommendations  Rolling walker (2 wheels)    Recommendations for Other Services Rehab consult     Precautions / Restrictions Precautions Precautions: Fall Restrictions Weight Bearing Restrictions: No     Mobility  Bed Mobility Overal bed mobility: Needs Assistance Bed Mobility: Supine to Sit     Supine to sit: Min assist     General bed mobility comments: Assist for trunk to come to sitting    Transfers Overall transfer level: Needs assistance Equipment used: Rolling walker (2 wheels) Transfers: Sit to/from Stand Sit to  Stand: Min assist           General transfer comment: Light assist to power up to full stand 2 unsteadiness, pt reaching for support upon standing    Ambulation/Gait Ambulation/Gait assistance: Min assist Gait Distance (Feet): 15 Feet Assistive device: Rolling walker (2 wheels) Gait Pattern/deviations: Step-through pattern, Decreased stride length, Trunk flexed, Drifts right/left Gait velocity: Decreased     General Gait Details: Pt only wanting to move within room as pt's breakfast in room and pt wanting to eat. Running into objects on the R and difficulty navigating obstacles without support. Verbal cues throughout.   Stairs             Wheelchair Mobility    Modified Rankin (Stroke Patients Only)       Balance Overall balance assessment: Needs assistance Sitting-balance support: Feet supported, No upper extremity supported Sitting balance-Leahy Scale: Fair Sitting balance - Comments: min assist dynamically for LB dressing   Standing balance support: Bilateral upper extremity supported, During functional activity, Reliant on assistive device for balance, Single extremity supported Standing balance-Leahy Scale: Poor Standing balance comment: relies on external and UE support                            Cognition Arousal/Alertness: Awake/alert Behavior During Therapy: WFL for tasks assessed/performed Overall Cognitive Status: Impaired/Different from baseline Area of Impairment: Attention, Following commands, Memory, Safety/judgement, Awareness, Problem solving                   Current Attention Level: Focused Memory: Decreased short-term memory Following Commands: Follows one step commands consistently, Follows one step commands with increased time, Follows multi-step commands inconsistently Safety/Judgement: Decreased awareness of  safety, Decreased awareness of deficits Awareness: Emergent Problem Solving: Slow processing, Difficulty  sequencing, Requires verbal cues General Comments: Cues for problem solving and safety.  Decreased recall noted. Requires redirection due to poor attention.  Question baseline cognitive level        Exercises      General Comments        Pertinent Vitals/Pain Pain Assessment Pain Assessment: Faces Faces Pain Scale: Hurts a little bit Pain Location: General grimacing and discomfort throughout mobility. Pain Descriptors / Indicators: Grimacing Pain Intervention(s): Limited activity within patient's tolerance, Monitored during session, Repositioned    Home Living                          Prior Function            PT Goals (current goals can now be found in the care plan section) Acute Rehab PT Goals Patient Stated Goal: to eat breakfast PT Goal Formulation: With patient Time For Goal Achievement: 04/16/22 Potential to Achieve Goals: Good Progress towards PT goals: Progressing toward goals    Frequency    Min 4X/week      PT Plan Current plan remains appropriate    Co-evaluation              AM-PAC PT "6 Clicks" Mobility   Outcome Measure  Help needed turning from your back to your side while in a flat bed without using bedrails?: A Little Help needed moving from lying on your back to sitting on the side of a flat bed without using bedrails?: A Little Help needed moving to and from a bed to a chair (including a wheelchair)?: A Little Help needed standing up from a chair using your arms (e.g., wheelchair or bedside chair)?: A Little Help needed to walk in hospital room?: A Little Help needed climbing 3-5 steps with a railing? : A Little 6 Click Score: 18    End of Session Equipment Utilized During Treatment: Gait belt Activity Tolerance: Patient tolerated treatment well Patient left: in chair;with call bell/phone within reach;with chair alarm set Nurse Communication: Mobility status PT Visit Diagnosis: Unsteadiness on feet (R26.81);Other  symptoms and signs involving the nervous system (R29.898);Difficulty in walking, not elsewhere classified (R26.2)     Time: 3016-0109 PT Time Calculation (min) (ACUTE ONLY): 10 min  Charges:  $Therapeutic Activity: 8-22 mins                     Kaitlyn Duncan, DPT  Acute Rehabilitation Services  Office: [phone]    Kaitlyn Duncan 04/10/2022, 12:03 PM

## 2022-04-11 DIAGNOSIS — J309 Allergic rhinitis, unspecified: Secondary | ICD-10-CM | POA: Diagnosis not present

## 2022-04-11 NOTE — Progress Notes (Signed)
PROGRESS NOTE    Kaitlyn Duncan  DJM:[id number] DOB: 06/03/1971 DOA: 04/08/2022 PCP: [name], NP-C   Brief Narrative:  Kaitlyn Duncan is a 51 y.o. female who woke up Tuesday morning with right-sided weakness.  She states that this been fairly stable since that time.  Due to the symptoms, she sought care in the emergency department last night where an MRI was performed demonstrating a subcortical stroke on the left.  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  - Ongoing ambulatory dysfunction - PT to follow  - 45 asa only per neuro recs (hold off on dual antiplatelet therapy given thrombocytopenia) - SLP/PO/OT following - resume home meds - advance diet - Echo 55-60%; A1C wnl, Lipid panel - elevated LDL/Chol -continue Crestor 40  Ambulatory dysfunction, secondary to above PT to follow  Incidental ACA 61m outpouching, concerning for early aneurysm 2 mm outpouching arising from the anterior communicating artery Outpatient follow-up/imaging per PCP  Allergic Rhinitis:  Continue antihistamine    Chronic thrombocytopenia:  Follow clinically, no signs/symptoms of bleeding   DVT prophylaxis: None given thrombocytopenia as above - asa and early ambulation Code Status: Full Family Communication: none present  Status is: Inpt  Dispo: The patient is from: Currently living in a motel              Anticipated d/c is to: TBD              Anticipated d/c date is: 24-48h              Patient currently NOT medically stable for  discharge  Consultants:  Neuro  Procedures:  None  Antimicrobials:  None   Subjective: No acute issues/events overnight  Objective: Vitals:   04/10/22 1114 04/10/22 1700 04/10/22 2021 04/11/22 0000  BP: 114/80 123/89  126/88  Pulse: 80 80 83 83  Resp: '17 14 18 18  '$ Temp: 98.3 F (36.8 C) 98.6 F (37 C) 98.4 F (36.9 C) 98 F (36.7 C)  TempSrc: Oral Oral Oral Oral  SpO2: 97% 98% 98% 99%  Weight:      Height:       No intake or output data in the 24 hours ending 04/11/22 0758  Filed Weights   04/09/22 0146  Weight: 64.2 kg    Examination:  General exam: Appears calm and comfortable  Respiratory system: Clear to auscultation. Respiratory effort normal. Cardiovascular system: S1 & S2 heard, RRR. No JVD, murmurs, rubs, gallops or clicks. No pedal edema. Gastrointestinal system: Abdomen is nondistended, soft and nontender. No organomegaly or masses felt. Normal bowel sounds heard. Central nervous system: Alert and oriented. No focal neurological deficits. Extremities: Symmetric 5 x 5 power. Skin: No rashes, lesions or ulcers Psychiatry: Judgement and insight appear normal. Mood & affect appropriate.   Data Reviewed: I have personally reviewed following labs and imaging studies  CBC: Recent Labs  Lab 04/08/22 1623 04/09/22 0434 04/10/22 0600  WBC 4.6 4.9 4.6  NEUTROABS 2.9 2.9  --   HGB 13.1 12.5 12.3  HCT 40.5 36.9 36.2  MCV 84.7 81.6 82.3  PLT  74* 62* 64*    Basic Metabolic Panel: Recent Labs  Lab 04/08/22 1623 04/09/22 0434 04/10/22 0600  NA 137 137 132*  K 3.8 3.2* 3.5  CL 108 103 101  CO2 22 24 20*  GLUCOSE 103* 118* 96  BUN '19 14 11  '$ CREATININE 0.64 0.71 0.66  CALCIUM 9.1 9.8 8.9  MG  --  1.9  --     GFR: Estimated Creatinine Clearance: 68 mL/min (by C-G formula based on SCr of 0.66 mg/dL). Liver Function Tests: Recent Labs  Lab 04/09/22 0434  AST 26  ALT 21  ALKPHOS 90  BILITOT 0.6  PROT 6.7  ALBUMIN 3.7    No results for  input(s): "LIPASE", "AMYLASE" in the last 168 hours. No results for input(s): "AMMONIA" in the last 168 hours. Coagulation Profile: No results for input(s): "INR", "PROTIME" in the last 168 hours. Cardiac Enzymes: No results for input(s): "CKTOTAL", "CKMB", "CKMBINDEX", "TROPONINI" in the last 168 hours. BNP (last 3 results) No results for input(s): "PROBNP" in the last 8760 hours. HbA1C: Recent Labs    04/08/22 1623  HGBA1C 5.3    CBG: Recent Labs  Lab 04/08/22 1537  GLUCAP 114*    Lipid Profile: Recent Labs    04/09/22 0434  CHOL 218*  HDL 38*  LDLCALC 152*  TRIG 139  CHOLHDL 5.7    Thyroid Function Tests: No results for input(s): "TSH", "T4TOTAL", "FREET4", "T3FREE", "THYROIDAB" in the last 72 hours. Anemia Panel: No results for input(s): "VITAMINB12", "FOLATE", "FERRITIN", "TIBC", "IRON", "RETICCTPCT" in the last 72 hours. Sepsis Labs: No results for input(s): "PROCALCITON", "LATICACIDVEN" in the last 168 hours.  No results found for this or any previous visit (from the past 240 hour(s)).       Radiology Studies: ECHOCARDIOGRAM COMPLETE  Result Date: 04/09/2022    ECHOCARDIOGRAM REPORT   Patient Name:   Kaitlyn Duncan [facility] Date of Exam: 04/09/2022 Medical Rec #:  [id number]             Height:       60.0 in Accession #:    [id number]            Weight:       141.5 lb Date of Birth:  06/19/1971              BSA:          1.612 m Patient Age:    63 years              BP:           129/92 mmHg Patient Gender: F                     HR:           78 bpm. Exam Location:  Inpatient Procedure: 2D Echo, Color Doppler and Cardiac Doppler Indications:    Stroke  History:        Patient has prior history of Echocardiogram examinations, most                 recent 05/26/2018. Stroke; Risk Factors:Non-Smoker.  Sonographer:    Bernadene Person RDCS Sonographer#2:  Greer Pickerel Referring Phys: [id number] Rhetta Mura  Sonographer Comments: Image acquisition challenging due to  respiratory motion. IMPRESSIONS  1. Left ventricular ejection fraction, by estimation, is 55 to 60%. The left ventricle has normal function. The left ventricle has no regional wall motion abnormalities. Left ventricular diastolic parameters were normal.  2. Right  ventricular systolic function is normal. The right ventricular size is normal.  3. The mitral valve is normal in structure. Trivial mitral valve regurgitation.  4. The aortic valve is tricuspid. Aortic valve regurgitation is not visualized. No aortic stenosis is present.  5. The inferior vena cava is normal in size with greater than 50% respiratory variability, suggesting right atrial pressure of 3 mmHg. Comparison(s): Compared to prior TTE in 2019, there is no significant change. Conclusion(s)/Recommendation(s): No intracardiac source of embolism detected on this transthoracic study. Consider a transesophageal echocardiogram to exclude cardiac source of embolism if clinically indicated. FINDINGS  Left Ventricle: Left ventricular ejection fraction, by estimation, is 55 to 60%. The left ventricle has normal function. The left ventricle has no regional wall motion abnormalities. The left ventricular internal cavity size was normal in size. There is  no left ventricular hypertrophy. Left ventricular diastolic parameters were normal. Right Ventricle: The right ventricular size is normal. No increase in right ventricular wall thickness. Right ventricular systolic function is normal. Left Atrium: Left atrial size was normal in size. Right Atrium: Right atrial size was normal in size. Pericardium: There is no evidence of pericardial effusion. Mitral Valve: The mitral valve is normal in structure. Trivial mitral valve regurgitation. Tricuspid Valve: The tricuspid valve is normal in structure. Tricuspid valve regurgitation is trivial. Aortic Valve: The aortic valve is tricuspid. Aortic valve regurgitation is not visualized. No aortic stenosis is present. Pulmonic  Valve: The pulmonic valve was normal in structure. Pulmonic valve regurgitation is trivial. Aorta: The aortic root and ascending aorta are structurally normal, with no evidence of dilitation. Venous: The inferior vena cava is normal in size with greater than 50% respiratory variability, suggesting right atrial pressure of 3 mmHg. IAS/Shunts: The atrial septum is grossly normal.  LEFT VENTRICLE PLAX 2D LVIDd:         4.10 cm     Diastology LVIDs:         2.60 cm     LV e' medial:    8.37 cm/s LV PW:         0.80 cm     LV E/e' medial:  7.5 LV IVS:        0.90 cm     LV e' lateral:   9.87 cm/s LVOT diam:     1.90 cm     LV E/e' lateral: 6.4 LV SV:         51 LV SV Index:   32 LVOT Area:     2.84 cm  LV Volumes (MOD) LV vol d, MOD A2C: 64.9 ml LV vol d, MOD A4C: 91.7 ml LV vol s, MOD A2C: 28.1 ml LV vol s, MOD A4C: 41.8 ml LV SV MOD A2C:     36.8 ml LV SV MOD A4C:     91.7 ml LV SV MOD BP:      44.2 ml RIGHT VENTRICLE RV S prime:     11.10 cm/s TAPSE (M-mode): 2.0 cm LEFT ATRIUM             Index        RIGHT ATRIUM          Index LA diam:        2.90 cm 1.80 cm/m   RA Area:     9.60 cm LA Vol (A2C):   28.7 ml 17.81 ml/m  RA Volume:   16.90 ml 10.49 ml/m LA Vol (A4C):   43.7 ml 27.12 ml/m LA Biplane Vol: 36.6 ml 22.71  ml/m  AORTIC VALVE LVOT Vmax:   89.90 cm/s LVOT Vmean:  61.600 cm/s LVOT VTI:    0.181 m  AORTA Ao Root diam: 2.90 cm Ao Asc diam:  3.10 cm MITRAL VALVE MV Area (PHT): 5.27 cm    SHUNTS MV Decel Time: 144 msec    Systemic VTI:  0.18 m MV E velocity: 62.80 cm/s  Systemic Diam: 1.90 cm MV A velocity: 42.30 cm/s MV E/A ratio:  1.48 Gwyndolyn Kaufman MD Electronically signed by Gwyndolyn Kaufman MD Signature Date/Time: 04/09/2022/11:04:44 AM    Final         Scheduled Meds:  aspirin EC  81 mg Oral Daily   linaclotide  145 mcg Oral QAC breakfast   loratadine  10 mg Oral Daily   rosuvastatin  40 mg Oral Daily   Continuous Infusions:   LOS: 3 days    Time spent: 64mn  Armandina Iman C  Linda Grimmer, DO [facility]  If 7PM-7AM, please contact night-coverage [email or web address]  04/11/2022, 7:58 AM

## 2022-04-12 DIAGNOSIS — J309 Allergic rhinitis, unspecified: Secondary | ICD-10-CM | POA: Diagnosis not present

## 2022-04-12 LAB — CBC
HCT: 37.9 % (ref 36.0–46.0)
Hemoglobin: 12.6 g/dL (ref 12.0–15.0)
MCH: 27.7 pg (ref 26.0–34.0)
MCHC: 33.2 g/dL (ref 30.0–36.0)
MCV: 83.3 fL (ref 80.0–100.0)
Platelets: 60 10*3/uL — ABNORMAL LOW (ref 150–400)
RBC: 4.55 MIL/uL (ref 3.87–5.11)
RDW: 12.8 % (ref 11.5–15.5)
WBC: 4 10*3/uL (ref 4.0–10.5)
nRBC: 0 % (ref 0.0–0.2)

## 2022-04-12 LAB — BASIC METABOLIC PANEL
Anion gap: 9 (ref 5–15)
BUN: 16 mg/dL (ref 6–20)
CO2: 25 mmol/L (ref 22–32)
Calcium: 9.8 mg/dL (ref 8.9–10.3)
Chloride: 103 mmol/L (ref 98–111)
Creatinine, Ser: 0.63 mg/dL (ref 0.44–1.00)
GFR, Estimated: >60 mL/min (ref >60)
Glucose, Bld: 93 mg/dL (ref 70–99)
Potassium: 4 mmol/L (ref 3.5–5.1)
Sodium: 137 mmol/L (ref 135–145)

## 2022-04-12 MED ORDER — ASPIRIN 81 MG PO TBEC: 81.0000 mg | DELAYED_RELEASE_TABLET | Freq: Every day | ORAL | 12 refills | Status: DC

## 2022-04-12 MED ORDER — LORATADINE 10 MG PO TABS: 10.0000 mg | ORAL_TABLET | Freq: Every day | ORAL | 0 refills | Status: DC

## 2022-04-12 MED ORDER — ROSUVASTATIN CALCIUM 40 MG PO TABS: 40.0000 mg | ORAL_TABLET | Freq: Every day | ORAL | 3 refills | Status: DC

## 2022-04-12 MED ORDER — LINACLOTIDE 72 MCG PO CAPS: 144.0000 ug | ORAL_CAPSULE | Freq: Every day | ORAL | 1 refills | Status: DC

## 2022-04-12 NOTE — Progress Notes (Signed)
Physical Therapy Treatment Patient Details Name: Kaitlyn Duncan MRN: [id number] DOB: 12-29-1970 Today's Date: 04/12/2022   History of Present Illness Pt is a 51 y/o female who presents 04/08/22 with R sided weakness. MRI revealed L acute ischemic nonhemorrhagic L thalamocapsular infarct.  PMH significant for anemia, melanoma with brain mets s/p whole brain radiation.    PT Comments    Pt progressing towards physical therapy goals. Was able to perform transfers and ambulation with gross min guard assist for balance support and safety. Pt showed improvement with wayfinding in hallway, and without overt LOB during ambulation. Pt completed DGI and scored 13/24, indicating she is at a higher risk for falls. Recommend pt continue to use the RW at this time. As pt is showing functional improvement, feel home health follow up would be a more appropriate d/c plan vs intensive therapy at the AIR level. Recommendations updated to reflect this. Will continue to follow and progress as albe per POC.    Recommendations for follow up therapy are one component of a multi-disciplinary discharge planning process, led by the attending physician.  Recommendations may be updated based on patient status, additional functional criteria and insurance authorization.  Follow Up Recommendations  Home health PT     Assistance Recommended at Discharge Frequent or constant Supervision/Assistance  Patient can return home with the following A little help with walking and/or transfers;A little help with bathing/dressing/bathroom;Assistance with cooking/housework;Assist for transportation   Equipment Recommendations  Rolling walker (2 wheels)    Recommendations for Other Services Rehab consult     Precautions / Restrictions Precautions Precautions: Fall Restrictions Weight Bearing Restrictions: No     Mobility  Bed Mobility               General bed mobility comments: Pt was received sitting up in the  recliner.    Transfers Overall transfer level: Needs assistance Equipment used: Rolling walker (2 wheels) Transfers: Sit to/from Stand Sit to Stand: Supervision           General transfer comment: Pt demonstrated proper hand placement on seated surface for safety.    Ambulation/Gait Ambulation/Gait assistance: Min guard Gait Distance (Feet): 300 Feet Assistive device: Rolling walker (2 wheels) Gait Pattern/deviations: Step-through pattern, Decreased stride length, Trunk flexed, Drifts right/left Gait velocity: Decreased Gait velocity interpretation: <1.31 ft/sec, indicative of household ambulator   General Gait Details: Pt navigating hallway with greater independence compared to initial evaluation. Hands on guarding throughout for optimal safety.   Stairs             Wheelchair Mobility    Modified Rankin (Stroke Patients Only) Modified Rankin (Stroke Patients Only) Pre-Morbid Rankin Score: Slight disability Modified Rankin: Moderately severe disability     Balance Overall balance assessment: Needs assistance Sitting-balance support: Feet supported, No upper extremity supported Sitting balance-Leahy Scale: Fair Sitting balance - Comments: min assist dynamically for LB dressing   Standing balance support: Bilateral upper extremity supported, No upper extremity supported, During functional activity Standing balance-Leahy Scale: Fair Standing balance comment: relies on RW for mobility, supervision for grooming without UE support standing at sink                 Standardized Balance Assessment Standardized Balance Assessment : Dynamic Gait Index   Dynamic Gait Index Level Surface: Mild Impairment Change in Gait Speed: Mild Impairment Gait with Horizontal Head Turns: Mild Impairment Gait with Vertical Head Turns: Mild Impairment Gait and Pivot Turn: Mild Impairment Step Over Obstacle: Severe Impairment Step  Around Obstacles: Moderate  Impairment Steps: Mild Impairment Total Score: 13      Cognition Arousal/Alertness: Awake/alert Behavior During Therapy: WFL for tasks assessed/performed Overall Cognitive Status: No family/caregiver present to determine baseline cognitive functioning Area of Impairment: Problem solving, Memory, Awareness                   Current Attention Level: Selective Memory: Decreased short-term memory Following Commands: Follows one step commands consistently, Follows one step commands with increased time, Follows multi-step commands inconsistently Safety/Judgement: Decreased awareness of safety, Decreased awareness of deficits Awareness: Emergent Problem Solving: Slow processing, Requires verbal cues          Exercises      General Comments General comments (skin integrity, edema, etc.): discussed with CM about recommendations to have pill box, have guardian assist with managing "refills and who to call"      Pertinent Vitals/Pain Pain Assessment Pain Assessment: No/denies pain Pain Intervention(s): Monitored during session    Home Living   Living Arrangements: Alone Available Help at Discharge: Other (Comment) (legal guardian  and ACT team provide transportation) Type of Home: Other(Comment) ([place] hotel) Home Access: Level entry       Home Layout: One level        Prior Function            PT Goals (current goals can now be found in the care plan section) Acute Rehab PT Goals Patient Stated Goal: Be able to return home. PT Goal Formulation: With patient Time For Goal Achievement: 04/16/22 Potential to Achieve Goals: Good Progress towards PT goals: Progressing toward goals    Frequency    Min 2X/week      PT Plan Discharge plan needs to be updated    Co-evaluation              AM-PAC PT "6 Clicks" Mobility   Outcome Measure  Help needed turning from your back to your side while in a flat bed without using bedrails?: None Help  needed moving from lying on your back to sitting on the side of a flat bed without using bedrails?: A Little Help needed moving to and from a bed to a chair (including a wheelchair)?: A Little Help needed standing up from a chair using your arms (e.g., wheelchair or bedside chair)?: A Little Help needed to walk in hospital room?: A Little Help needed climbing 3-5 steps with a railing? : A Little 6 Click Score: 19    End of Session Equipment Utilized During Treatment: Gait belt Activity Tolerance: Patient tolerated treatment well Patient left: in chair;with call bell/phone within reach;with chair alarm set Nurse Communication: Mobility status PT Visit Diagnosis: Unsteadiness on feet (R26.81);Other symptoms and signs involving the nervous system (R29.898);Difficulty in walking, not elsewhere classified (R26.2)     Time: 6283-1517 PT Time Calculation (min) (ACUTE ONLY): 17 min  Charges:  $Physical Performance Test: 8-22 mins                     Rolinda Roan, PT, DPT Acute Rehabilitation Services Secure Chat Preferred Office: [phone]    Thelma Comp 04/12/2022, 2:17 PM

## 2022-04-12 NOTE — Progress Notes (Signed)
Mobility Specialist: Progress Note   04/12/22 1543  Mobility  Activity Ambulated with assistance in hallway  Level of Assistance Contact guard assist, steadying assist  Assistive Device Front wheel walker  Distance Ambulated (ft) 270 ft  Activity Response Tolerated well  $Mobility charge 1 Mobility   Pt received in the bed, sleeping but easily awakened and agreeable to mobility. No c/o throughout ambulation. Verbal cues for RW management as pt noted to be drifting L occasionally during session. Pt to BR per request after returning to the room and then back to bed. Pt has call bell and phone in reach.   [facility] Kaitlyn Duncan Mobility Specialist Mobility Specialist [address]: [phone]

## 2022-04-12 NOTE — TOC Initial Note (Signed)
Transition of Care [facility]) - Initial/Assessment Note    Patient Details  Name: Kaitlyn Duncan MRN: [id number] Date of Birth: 04/27/1971  Transition of Care [facility]) CM/SW Contact:    Pollie Friar, RN Phone Number: 04/12/2022, 2:20 PM  Clinical Narrative:                 Pt lives in [facility] room 144. She has an ACT team and legal guardian. She has improved since her CVA and PT/OT recommending home. CM has arranged [place] services through [place]. Information on the AVS. They will contact her for her first visit.  CM has updated her Erma Pinto and she is in agreement with the plan. She asked that pts ACT team be notified and see what assistance they can provide. CM called Colletta [place] with ACT team: [phone]. She asked that pts d/c meds be sent tonight to [facility] so they can blister pack them and she will make sure pt has them tomorrow. She says they will also provide transport home.  Walker and shower seat for home ordered through [employer] and will be delivered to the room.  TOC following.   Expected Discharge Plan: [place] Barriers to Discharge: Continued Medical Work up   Patient Goals and CMS Choice   CMS Medicare.gov Compare Post Acute Care list provided to:: Legal Guardian Choice offered to / list presented to : Patient, [facility] POA / Guardian  Expected Discharge Plan and Services Expected Discharge Plan: [place]   Discharge Planning Services: CM Consult Post Acute Care Choice: Home Health, Durable Medical Equipment Living arrangements for the past 2 months: Hotel/Motel                 DME Arranged: Shower stool, Walker rolling DME Agency: [employer] Date DME Agency Contacted: 04/12/22   Representative spoke with at DME Agency: Mardene Celeste HH Arranged: PT, OT [place] Agency: [place] Date [place]: 04/12/22   Representative spoke with at [place]: Tommi Rumps  Prior Living  Arrangements/Services Living arrangements for the past 2 months: Hotel/Motel Lives with:: Self Patient language and need for interpreter reviewed:: Yes Do you feel safe going back to the place where you live?: Yes        Care giver support system in place?: No (comment)   Criminal Activity/Legal Involvement Pertinent to Current Situation/Hospitalization: No - Comment as needed  Activities of Daily Living Home Assistive Devices/Equipment: None ADL Screening (condition at time of admission) Patient's cognitive ability adequate to safely complete daily activities?: Yes Is the patient deaf or have difficulty hearing?: No Does the patient have difficulty seeing, even when wearing glasses/contacts?: No Does the patient have difficulty concentrating, remembering, or making decisions?: No Patient able to express need for assistance with ADLs?: Yes Does the patient have difficulty dressing or bathing?: No Independently performs ADLs?: Yes (appropriate for developmental age) Does the patient have difficulty walking or climbing stairs?: No Weakness of Legs: None Weakness of Arms/Hands: None  Permission Sought/Granted                  Emotional Assessment Appearance:: Appears stated age Attitude/Demeanor/Rapport: Engaged Affect (typically observed): Accepting Orientation: : Oriented to Self, Oriented to Place, Oriented to  Time, Oriented to Situation   Psych Involvement: No (comment)  Admission diagnosis:  Acute ischemic stroke [facility]) [I63.9] Acute CVA (cerebrovascular accident) [facility]) [I63.9] Patient Active Problem List   Diagnosis Date Noted   Frequent falls 04/09/2022  Allergic rhinitis 04/09/2022   Acute ischemic stroke ([place]) 04/08/2022   Prediabetes 12/24/2021   Frequent urination 12/24/2021   Schizophrenia ([place]) 09/25/2021   Leukopenia 06/27/2021   Thrombocytopenia ([place]) 06/27/2021   Schizophrenia spectrum disorder with psychotic disorder type not yet determined ([place])  06/26/2021   Has difficulty accessing primary care provider for most visits 06/15/2021   Malingering 01/23/2021   Candidiasis of skin 01/23/2021   Musculoskeletal pain 01/23/2021   Psychosis ([place])    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   Melanoma of skin ([place]) 05/29/2018   Goals of care, counseling/discussion 05/29/2018   Malignant neoplasm metastatic to brain ([place]) 01/26/2018   PCP:  [name], NP-C Pharmacy:   [place] [address] Phone: [phone] Fax: [phone]  [place], [place]. [place]. [place] [address] Phone: [phone] Fax: [place], [place] [address] Phone: [phone] Fax: [phone]     Social Determinants of Health (SDOH) Interventions    Readmission Risk Interventions     No data to display

## 2022-04-12 NOTE — Progress Notes (Signed)
Occupational Therapy Treatment Patient Details Name: Kaitlyn Duncan MRN: [id number] DOB: 1971-04-02 Today's Date: 04/12/2022   History of present illness Pt is a 51 y/o female who presents 04/08/22 with R sided weakness. MRI revealed L acute ischemic nonhemorrhagic L thalamocapsular infarct.  PMH significant for anemia, melanoma with brain mets s/p whole brain radiation.   OT comments  Patient reports feeling better every day.  Completed 3 step task in room, needing assist to recall 2nd ADL task (washing face at sink). Demonstrates ability to complete functional mobility and ADLs with supervision.  When in the shower, asked pt to show me how she would wash her feet- she voiced "Well I would need a chair to sit down" demonstrating fair problem solving skills. But question higher level problem solving skills, as pt is unsure how to get her phone to work again, how to get the numbers she needs off her phone to get a ride to [employer] to fix her phone. Biggest concerns are problem solving and recall, but believe once patient gets back to her routine and in her environment she will do better. Will follow up with pill box test to assess higher level cognition.    Recommendations for follow up therapy are one component of a multi-disciplinary discharge planning process, led by the attending physician.  Recommendations may be updated based on patient status, additional functional criteria and insurance authorization.    Follow Up Recommendations  Acute inpatient rehab (3hours/day)    Assistance Recommended at Discharge Frequent or constant Supervision/Assistance  Patient can return home with the following  A little help with walking and/or transfers;A little help with bathing/dressing/bathroom;Assistance with cooking/housework;Direct supervision/assist for medications management;Direct supervision/assist for financial management;Assist for transportation;Help with stairs or ramp for entrance   Equipment  Recommendations  Tub/shower seat    Recommendations for Other Services      Precautions / Restrictions Precautions Precautions: Fall Restrictions Weight Bearing Restrictions: No       Mobility Bed Mobility               General bed mobility comments: OOB in recliner upn entry    Transfers Overall transfer level: Needs assistance Equipment used: Rolling walker (2 wheels) Transfers: Sit to/from Stand Sit to Stand: Supervision           General transfer comment: using RW     Balance Overall balance assessment: Needs assistance Sitting-balance support: Feet supported, No upper extremity supported Sitting balance-Leahy Scale: Fair     Standing balance support: Bilateral upper extremity supported, No upper extremity supported, During functional activity Standing balance-Leahy Scale: Fair Standing balance comment: relies on RW for mobility, supervision for grooming without UE support standing at sink                           ADL either performed or assessed with clinical judgement   ADL Overall ADL's : Needs assistance/impaired     Grooming: Supervision/safety;Standing;Wash/dry hands;Wash/dry face           Upper Body Dressing : Supervision/safety;Sitting   Lower Body Dressing: Supervision/safety;Sit to/from stand   Toilet Transfer: Supervision/safety;Ambulation;Rolling walker (2 wheels)       Tub/ Shower Transfer: Tub transfer;Min guard;Ambulation;BSC/3in1;Rolling walker (2 wheels) Tub/Shower Transfer Details (indicate cue type and reason): simulated in room Functional mobility during ADLs: Supervision/safety;Rolling walker (2 wheels)      Extremity/Trunk Assessment              Vision  Perception     Praxis      Cognition Arousal/Alertness: Awake/alert Behavior During Therapy: WFL for tasks assessed/performed Overall Cognitive Status: Impaired/Different from baseline Area of Impairment: Attention, Memory,  Awareness, Following commands, Safety/judgement, Problem solving                   Current Attention Level: Selective Memory: Decreased short-term memory Following Commands: Follows one step commands consistently, Follows one step commands with increased time, Follows multi-step commands inconsistently Safety/Judgement: Decreased awareness of safety, Decreased awareness of deficits Awareness: Emergent Problem Solving: Slow processing, Requires verbal cues General Comments: patient completing 3 step task in room with cueing required to recall 2nd ADL task (washing face at sink).  She demonstrates fair problem sovling ability when using room phone to call down to order meal.  Pt reporting fair awareness of needing to get her phone fixed at [employer], but does not know how she will get there if she cannot call for a ride without her phone.  REports she could use the internet or phone book, but does not have one here or at her hotel.        Exercises      Shoulder Instructions       General Comments discussed meals, med mgmt, calling for rides and daily routine to determine safety with dc home.  COncerned about medication management, recall and problem solving.  Plan for pill box test to further assess.    Pertinent Vitals/ Pain       Pain Assessment Pain Assessment: No/denies pain  Home Living   Living Arrangements: Alone Available Help at Discharge: Other (Comment) (legal guardian  and ACT team provide transportation) Type of Home: Other(Comment) ([place]) Home Access: Level entry     Home Layout: One level     Bathroom Shower/Tub: Teacher, early years/pre: Standard Bathroom Accessibility: Yes How Accessible: Accessible via walker            Prior Functioning/Environment              Frequency  Min 2X/week        Progress Toward Goals  OT Goals(current goals can now be found in the care plan section)  Progress towards OT goals:  Progressing toward goals  Acute Rehab OT Goals Patient Stated Goal: home Time For Goal Achievement: 04/23/22 Potential to Achieve Goals: Good  Plan Discharge plan remains appropriate;Frequency remains appropriate    Co-evaluation                 AM-PAC OT "6 Clicks" Daily Activity     Outcome Measure   Help from another person eating meals?: A Little Help from another person taking care of personal grooming?: A Little Help from another person toileting, which includes using toliet, bedpan, or urinal?: A Little Help from another person bathing (including washing, rinsing, drying)?: A Little Help from another person to put on and taking off regular upper body clothing?: A Little Help from another person to put on and taking off regular lower body clothing?: A Little 6 Click Score: 18    End of Session Equipment Utilized During Treatment: Rolling walker (2 wheels)  OT Visit Diagnosis: Other abnormalities of gait and mobility (R26.89);Muscle weakness (generalized) (M62.81);Other symptoms and signs involving cognitive function   Activity Tolerance Patient tolerated treatment well   Patient Left with call bell/phone within reach;in chair;with chair alarm set   Nurse Communication Mobility status  Time: 1139-1209 OT Time Calculation (min): 30 min  Charges: OT General Charges $OT Visit: 1 Visit OT Treatments $Self Care/Home Management : 8-22 mins $Cognitive Funtion inital: Initial 15 mins  Jolaine Artist, OT Acute Rehabilitation Services Office [phone]   Delight Stare 04/12/2022, 1:16 PM

## 2022-04-12 NOTE — Progress Notes (Signed)
OT treatment:  Patient seated in recliner finishing lunch.  Engaged in pill box test to assess higher level functional cognition. Pt passed the pill box test with 1 error (taking longer than 5 minutes) but no errors with medication mgmt, demonstrating good ability to multitask, attend, problem solve, plan, and sequencing through task.  Pt open to using pill box at home (discussed with CM on how to get her one), to aide in recall --she reports plan to continue to take prescribed medications. Will have intermittent help at home and can have oversight on refilling medications as needed. Pt asking appropriate questions throughout session, feel she will be able to dc home and manage her basic ADL/IADL needs.  Will follow acutely.     04/12/22 1300  OT Visit Information  Last OT Received On 04/12/22  Assistance Needed +1  History of Present Illness Pt is a 51 y/o female who presents 04/08/22 with R sided weakness. MRI revealed L acute ischemic nonhemorrhagic L thalamocapsular infarct.  PMH significant for anemia, melanoma with brain mets s/p whole brain radiation.  Precautions  Precautions Fall  Restrictions  Weight Bearing Restrictions No  Pain Assessment  Pain Assessment No/denies pain  Cognition  Arousal/Alertness Awake/alert  Behavior During Therapy WFL for tasks assessed/performed  Overall Cognitive Status No family/caregiver present to determine baseline cognitive functioning  Area of Impairment Problem solving;Memory;Awareness  Memory Decreased short-term memory  Awareness Emergent  Problem Solving Slow processing;Requires verbal cues  General Comments patient passed pill box test. anticipate cognition is near baseline. Pt did require some cueing to problem solve through who to call when needing refills on medication, but antiicpate near baseline.  Pt open to using pillbox to aide memory.  ADL  General ADL Comments focus on pill box test  General Comments  General comments (skin integrity,  edema, etc.) discussed with CM about recommendations to have pill box, have guardian assist with managing "refills and who to call"  OT - End of Session  Activity Tolerance Patient tolerated treatment well  Patient left with call bell/phone within reach;in chair;with chair alarm set  Nurse Communication Mobility status  OT Assessment/Plan  OT Plan Discharge plan needs to be updated;Frequency remains appropriate  OT Visit Diagnosis Other abnormalities of gait and mobility (R26.89);Muscle weakness (generalized) (M62.81);Other symptoms and signs involving cognitive function  OT Frequency (ACUTE ONLY) Min 2X/week  Follow Up Recommendations Home health OT  Assistance recommended at discharge Intermittent Supervision/Assistance  Patient can return home with the following A little help with walking and/or transfers;A little help with bathing/dressing/bathroom;Assistance with cooking/housework;Direct supervision/assist for medications management;Direct supervision/assist for financial management;Assist for transportation;Help with stairs or ramp for entrance  OT Equipment Tub/shower seat  AM-PAC OT "6 Clicks" Daily Activity Outcome Measure (Version 2)  Help from another person eating meals? 4  Help from another person taking care of personal grooming? 3  Help from another person toileting, which includes using toliet, bedpan, or urinal? 3  Help from another person bathing (including washing, rinsing, drying)? 3  Help from another person to put on and taking off regular upper body clothing? 3  Help from another person to put on and taking off regular lower body clothing? 3  6 Click Score 19  OT Goal Progression  Progress towards OT goals Progressing toward goals  Acute Rehab OT Goals  Patient Stated Goal home  Time For Goal Achievement 04/23/22  Potential to Achieve Goals Good  OT Time Calculation  OT Start Time (ACUTE ONLY) 1240  OT  Stop Time (ACUTE ONLY) 1312  OT Time Calculation (min) 32 min   OT General Charges  $OT Visit 1 Visit  OT Treatments  $Self Care/Home Management  23-37 mins    [place] Office [phone]

## 2022-04-12 NOTE — Care Management Important Message (Signed)
Important Message  Patient Details  Name: Kaitlyn Duncan MRN: [id number] Date of Birth: [birth year]   Medicare Important Message Given:  Yes     Nicosha Struve [place] 04/12/2022, 3:47 PM

## 2022-04-12 NOTE — Progress Notes (Signed)
PROGRESS NOTE    Kaitlyn Duncan  [id number] DOB: [birth year] DOA: 04/08/2022 PCP: [name], NP-C   Brief Narrative:  Kaitlyn Duncan is a 51 y.o. female who woke up Tuesday morning with right-sided weakness.  She states that this been fairly stable since that time.  Due to the symptoms, she sought care in the emergency department last night where an MRI was performed demonstrating a subcortical stroke on the left.  Patient initially having profound ambulatory dysfunction requiring additional support from baseline, recommended for CIR initially.  Patient symptoms appear to be improving, patient more stable today on exam with decreased instability per PT, may be able to progress home with home health and PT in the next 24 to 48 hours.  Known history includes stage IV melanoma with metastasis to brain and adrenal grands status post whole brain radiation with melanoma now reported to be in remission, allergic rhinitis, chronic thrombocytopenia  Assessment & Plan:   Principal Problem:   Acute ischemic stroke [facility]) Active Problems:   Thrombocytopenia (HCC)   Frequent falls   Allergic rhinitis  Acute ischemic stroke:  - Confirmed on MRI - acute ischemic nonhemorrhagic left thalamic capsular infarct.  - Ongoing ambulatory dysfunction - PT to follow -initial recs for CIR, given ongoing improvement may be able to discharge home with home health in the next 24 to 48 hours. - 42 asa only per neuro recs (hold off on dual antiplatelet therapy given thrombocytopenia) - SLP/PO/OT following - resume home meds - advance diet - Echo 55-60%; A1C wnl, Lipid panel - elevated LDL/Chol -continue Crestor 40  Ambulatory dysfunction, secondary to above PT to follow -initially requesting CIR, may be able to discharge home with home health and PT given improved strength and stability as above  Incidental ACA 65m outpouching, concerning for early aneurysm 2 mm outpouching arising from the  anterior communicating artery Outpatient follow-up/imaging per PCP  Allergic Rhinitis:  Continue antihistamine    Chronic thrombocytopenia:  Follow clinically, no signs/symptoms of bleeding   DVT prophylaxis: None given thrombocytopenia as above - asa and early ambulation Code Status: Full Family Communication: none present  Status is: Inpt  Dispo: The patient is from: Currently living in a motel              Anticipated d/c is to: Home with home health PT 24 to 48 hours              Anticipated d/c date is: 24-48h              Patient currently NOT medically stable for discharge  Consultants:  Neuro  Procedures:  None  Antimicrobials:  None   Subjective: No acute issues/events overnight, denies nausea vomiting diarrhea constipation headache fevers chills or chest pain  Objective: Vitals:   04/11/22 2047 04/11/22 2101 04/12/22 0000 04/12/22 0452  BP:  (!) 148/90 (!) 134/91 116/75  Pulse:  82 67 75  Resp: '18 17  14  '$ Temp:  98 F (36.7 C) 98.5 F (36.9 C) 98.7 F (37.1 C)  TempSrc:  Oral Oral Oral  SpO2:  98% 97% 96%  Weight:      Height:       No intake or output data in the 24 hours ending 04/12/22 0659  Filed Weights   04/09/22 0146  Weight: 64.2 kg    Examination:  General:  Pleasantly resting in bed, No acute distress. HEENT:  Normocephalic atraumatic.  Sclerae nonicteric, noninjected.  Extraocular movements intact bilaterally.  Neck:  Without mass or deformity.  Trachea is midline. Lungs:  Clear to auscultate bilaterally without rhonchi, wheeze, or rales. Heart:  Regular rate and rhythm.  Without murmurs, rubs, or gallops. Abdomen:  Soft, nontender, nondistended.  Without guarding or rebound. Extremities: Without cyanosis, clubbing, edema, or obvious deformity. Vascular:  Dorsalis pedis and posterior tibial pulses palpable bilaterally. Skin:  Warm and dry, no erythema, no ulcerations.   Data Reviewed: I have personally reviewed following labs and  imaging studies  CBC: Recent Labs  Lab 04/08/22 1623 04/09/22 0434 04/10/22 0600  WBC 4.6 4.9 4.6  NEUTROABS 2.9 2.9  --   HGB 13.1 12.5 12.3  HCT 40.5 36.9 36.2  MCV 84.7 81.6 82.3  PLT 74* 62* 64*    Basic Metabolic Panel: Recent Labs  Lab 04/08/22 1623 04/09/22 0434 04/10/22 0600  NA 137 137 132*  K 3.8 3.2* 3.5  CL 108 103 101  CO2 22 24 20*  GLUCOSE 103* 118* 96  BUN '19 14 11  '$ CREATININE 0.64 0.71 0.66  CALCIUM 9.1 9.8 8.9  MG  --  1.9  --     GFR: Estimated Creatinine Clearance: 68 mL/min (by C-G formula based on SCr of 0.66 mg/dL). Liver Function Tests: Recent Labs  Lab 04/09/22 0434  AST 26  ALT 21  ALKPHOS 90  BILITOT 0.6  PROT 6.7  ALBUMIN 3.7    No results for input(s): "LIPASE", "AMYLASE" in the last 168 hours. No results for input(s): "AMMONIA" in the last 168 hours. Coagulation Profile: No results for input(s): "INR", "PROTIME" in the last 168 hours. Cardiac Enzymes: No results for input(s): "CKTOTAL", "CKMB", "CKMBINDEX", "TROPONINI" in the last 168 hours. BNP (last 3 results) No results for input(s): "PROBNP" in the last 8760 hours. HbA1C: No results for input(s): "HGBA1C" in the last 72 hours.  CBG: Recent Labs  Lab 04/08/22 1537  GLUCAP 114*    Lipid Profile: No results for input(s): "CHOL", "HDL", "LDLCALC", "TRIG", "CHOLHDL", "LDLDIRECT" in the last 72 hours.  Thyroid Function Tests: No results for input(s): "TSH", "T4TOTAL", "FREET4", "T3FREE", "THYROIDAB" in the last 72 hours. Anemia Panel: No results for input(s): "VITAMINB12", "FOLATE", "FERRITIN", "TIBC", "IRON", "RETICCTPCT" in the last 72 hours. Sepsis Labs: No results for input(s): "PROCALCITON", "LATICACIDVEN" in the last 168 hours.  No results found for this or any previous visit (from the past 240 hour(s)).       Radiology Studies: No results found.      Scheduled Meds:  aspirin EC  81 mg Oral Daily   linaclotide  145 mcg Oral QAC breakfast    loratadine  10 mg Oral Daily   rosuvastatin  40 mg Oral Daily   Continuous Infusions:   LOS: 4 days    Time spent: 16mn  Amanii Snethen C Emani Taussig, DO [facility]  If 7PM-7AM, please contact night-coverage [email or web address]  04/12/2022, 6:59 AM

## 2022-04-12 NOTE — Progress Notes (Signed)
Inpatient Rehab Admissions Coordinator:  Note therapy has updated follow up recommendations to [facility] therapy. It appears pt no longer requires the intensity of CIR. TOC made aware. AC will sign off.   Gayland Curry, [place], [place] Admissions Coordinator [phone]

## 2022-04-12 NOTE — Progress Notes (Signed)
SDOH Interventions Today    Flowsheet Row Most Recent Value  SDOH Interventions   Food Insecurity Interventions Inpatient TOC  [pt has ACT team that assists her with shopping. Pt uses heat up meals at home]  Housing Interventions Inpatient TOC  [Pt is in a hotel that ACT and guardian have gotten for her. they visit her often and make sure she has a place to stay]  Transportation Interventions Inpatient TOC  [pts ACT team provides some transportation. she also uses [employer] and cabs as needed]

## 2022-04-13 ENCOUNTER — Ambulatory Visit: Payer: Medicare Other | Admitting: Physician Assistant

## 2022-04-13 DIAGNOSIS — I639 Cerebral infarction, unspecified: Secondary | ICD-10-CM | POA: Diagnosis not present

## 2022-04-13 MED ORDER — LINACLOTIDE 72 MCG PO CAPS: 144.0000 ug | ORAL_CAPSULE | Freq: Every day | ORAL | 1 refills | Status: DC

## 2022-04-13 NOTE — Progress Notes (Signed)
PIV removed. Discharge instructions completed. Patient verbalized understanding of medication regimen, follow up appointments and discharge instructions. Patient belongings gathered and packed to discharge.  

## 2022-04-13 NOTE — Discharge Summary (Signed)
Physician Discharge Summary  Kaitlyn Duncan TGG:[id number] DOB: [birth year] DOA: 04/08/2022  PCP: [name], NP-C  Admit date: 04/08/2022 Discharge date: 04/13/2022  Admitted From: Home Disposition: Home  Recommendations for Outpatient Follow-up:  Follow up with PCP in 1-2 weeks  Home Health: Home health PT Equipment/Devices: None   Discharge Condition: Stable CODE STATUS: Full Diet recommendation: Regular diet.  Brief/Interim Summary: Kaitlyn Duncan is a 51 y.o. 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  She states that this been fairly stable since that time.  Due to the symptoms, she sought care in the emergency department last night where an MRI was performed demonstrating a subcortical stroke on the left.  Patient initially having profound ambulatory dysfunction requiring additional support from baseline, recommended for CIR initially.  Patient symptoms appear to be improving, patient more stable today on exam with decreased instability per PT, may be able to progress home with home health and PT in the next 24 to 48 hours.   Patient now able to ambulate without any further complaints, continue home health PT at discharge, otherwise continue new medications as outlined below including aspirin and statin per neurology recommendations.   Discharge Diagnoses:  Principal Problem:   Acute ischemic stroke [facility]) Active Problems:   Thrombocytopenia (HCC)   Frequent falls   Allergic rhinitis  Acute ischemic stroke:  - Confirmed on MRI - acute ischemic nonhemorrhagic left thalamic capsular infarct.  - 24 asa only per neuro recs (hold off on dual antiplatelet therapy given thrombocytopenia)  Ambulatory dysfunction, secondary to  above Newly profoundly weak ultimately PT recommending transfer to CIR versus SNF but patient continued to progress over the past 48 hours and is now improving drastically otherwise stable for discharge as above with home health and PT   Incidental ACA 38m outpouching, concerning for early aneurysm 2 mm outpouching arising from the anterior communicating artery Outpatient follow-up/imaging per PCP   Allergic Rhinitis:  Continue antihistamine    Chronic thrombocytopenia:  Follow clinically, no signs/symptoms of bleeding   Discharge Instructions  Discharge Instructions     Ambulatory referral to Neurology   Complete by: As directed    Follow up with stroke clinic NP (Jessica Vanschaick or CCecille Rubin if both not available, consider [facility] Dauer [facility]   Discharge patient   Complete by: As directed    Discharge disposition: 06-Home-Health Care Svc   Discharge patient date: 04/13/2022      Allergies as of 04/13/2022   No Known Allergies      Medication List     STOP taking these medications    acetaminophen 500 MG tablet Commonly known as: TYLENOL   Antifungal Clotrimazole 1 % cream Generic drug: clotrimazole   polyethylene glycol 17 g packet Commonly known as: MiraLax   STOOL SOFTENER LAXATIVE PO       TAKE these medications    aspirin EC 81 MG tablet Take 1 tablet (81 mg total) by mouth daily. Swallow whole.   GAS RELIEF 125 MAX ST PO Take 1 tablet by mouth as needed.   Hair Skin and Nails Formula Tabs Take 2 tablets by mouth daily.   MULTIVITAMIN GUMMIES ADULT PO Take 1 tablet by mouth.   linaclotide 72 MCG capsule Commonly known as:  LINZESS Take 2 capsules (144 mcg total) by mouth daily before breakfast. Take 1 capsule (72 mcg total) by mouth daily 30 minutes before breakfast. What changed:  how much to take how to take this when to take this   loratadine 10 MG tablet Commonly known as:  CLARITIN Take 1 tablet (10 mg total) by mouth daily.   milk thistle 175 MG tablet Take 175 mg by mouth daily.   rosuvastatin 40 MG tablet Commonly known as: CRESTOR Take 1 tablet (40 mg total) by mouth daily.   Turmeric Curcumin 500 MG Caps Take 1 capsule by mouth daily in the afternoon.               Durable Medical Equipment  (From admission, onward)           Start     Ordered   04/12/22 1420  For home use only DME Shower stool  Once        04/12/22 1419   04/12/22 1410  For home use only DME Walker rolling  Once       Comments: Youth sized--5 feet tall  Question Answer Comment  Walker: With 5 Inch Wheels   Patient needs a walker to treat with the following condition Stroke [facility])      04/12/22 1410            Follow-up Information     [facility]. Schedule an appointment as soon as possible for a visit in 1 month(s).   Specialty: Neurology Why: stroke clinic Contact information: [address] [place], [facility] Follow up.   Specialty: Home Health Services Why: The home health agency will contact you for the first home visit. Contact information: [place] [address] [phone]                No Known Allergies  Consultations: Neurology   Procedures/Studies: ECHOCARDIOGRAM COMPLETE  Result Date: 04/09/2022    ECHOCARDIOGRAM REPORT   Patient Name:   Kaitlyn Duncan [facility] Date of Exam: 04/09/2022 Medical Rec #:  [id number]             Height:       60.0 in Accession #:    [id number]            Weight:       141.5 lb Date of Birth:  1970/09/24              BSA:          1.612 m Patient Age:    73 years              BP:           129/92 mmHg Patient Gender: F                     HR:           78 bpm. Exam Location:  Inpatient Procedure: 2D Echo, Color Doppler and Cardiac Doppler Indications:    Stroke  History:         Patient has prior history of Echocardiogram examinations, most                 recent 05/26/2018. Stroke; Risk Factors:Non-Smoker.  Sonographer:    Bernadene Person RDCS Sonographer#2:  Greer Pickerel Referring Phys: [id number] Rhetta Mura  Sonographer Comments: Image acquisition challenging due to  respiratory motion. IMPRESSIONS  1. Left ventricular ejection fraction, by estimation, is 55 to 60%. The left ventricle has normal function. The left ventricle has no regional wall motion abnormalities. Left ventricular diastolic parameters were normal.  2. Right ventricular systolic function is normal. The right ventricular size is normal.  3. The mitral valve is normal in structure. Trivial mitral valve regurgitation.  4. The aortic valve is tricuspid. Aortic valve regurgitation is not visualized. No aortic stenosis is present.  5. The inferior vena cava is normal in size with greater than 50% respiratory variability, suggesting right atrial pressure of 3 mmHg. Comparison(s): Compared to prior TTE in 2019, there is no significant change. Conclusion(s)/Recommendation(s): No intracardiac source of embolism detected on this transthoracic study. Consider a transesophageal echocardiogram to exclude cardiac source of embolism if clinically indicated. FINDINGS  Left Ventricle: Left ventricular ejection fraction, by estimation, is 55 to 60%. The left ventricle has normal function. The left ventricle has no regional wall motion abnormalities. The left ventricular internal cavity size was normal in size. There is  no left ventricular hypertrophy. Left ventricular diastolic parameters were normal. Right Ventricle: The right ventricular size is normal. No increase in right ventricular wall thickness. Right ventricular systolic function is normal. Left Atrium: Left atrial size was normal in size. Right Atrium: Right atrial size was normal in size. Pericardium: There is no evidence of pericardial effusion. Mitral Valve: The mitral  valve is normal in structure. Trivial mitral valve regurgitation. Tricuspid Valve: The tricuspid valve is normal in structure. Tricuspid valve regurgitation is trivial. Aortic Valve: The aortic valve is tricuspid. Aortic valve regurgitation is not visualized. No aortic stenosis is present. Pulmonic Valve: The pulmonic valve was normal in structure. Pulmonic valve regurgitation is trivial. Aorta: The aortic root and ascending aorta are structurally normal, with no evidence of dilitation. Venous: The inferior vena cava is normal in size with greater than 50% respiratory variability, suggesting right atrial pressure of 3 mmHg. IAS/Shunts: The atrial septum is grossly normal.  LEFT VENTRICLE PLAX 2D LVIDd:         4.10 cm     Diastology LVIDs:         2.60 cm     LV e' medial:    8.37 cm/s LV PW:         0.80 cm     LV E/e' medial:  7.5 LV IVS:        0.90 cm     LV e' lateral:   9.87 cm/s LVOT diam:     1.90 cm     LV E/e' lateral: 6.4 LV SV:         51 LV SV Index:   32 LVOT Area:     2.84 cm  LV Volumes (MOD) LV vol d, MOD A2C: 64.9 ml LV vol d, MOD A4C: 91.7 ml LV vol s, MOD A2C: 28.1 ml LV vol s, MOD A4C: 41.8 ml LV SV MOD A2C:     36.8 ml LV SV MOD A4C:     91.7 ml LV SV MOD BP:      44.2 ml RIGHT VENTRICLE RV S prime:     11.10 cm/s TAPSE (M-mode): 2.0 cm LEFT ATRIUM             Index        RIGHT ATRIUM          Index LA diam:        2.90 cm 1.80 cm/m   RA Area:  9.60 cm LA Vol (A2C):   28.7 ml 17.81 ml/m  RA Volume:   16.90 ml 10.49 ml/m LA Vol (A4C):   43.7 ml 27.12 ml/m LA Biplane Vol: 36.6 ml 22.71 ml/m  AORTIC VALVE LVOT Vmax:   89.90 cm/s LVOT Vmean:  61.600 cm/s LVOT VTI:    0.181 m  AORTA Ao Root diam: 2.90 cm Ao Asc diam:  3.10 cm MITRAL VALVE MV Area (PHT): 5.27 cm    SHUNTS MV Decel Time: 144 msec    Systemic VTI:  0.18 m MV E velocity: 62.80 cm/s  Systemic Diam: 1.90 cm MV A velocity: 42.30 cm/s MV E/A ratio:  1.48 Gwyndolyn Kaufman MD Electronically signed by Gwyndolyn Kaufman MD  Signature Date/Time: 04/09/2022/11:04:44 AM    Final    CT ANGIO HEAD NECK W WO CM  Result Date: 04/09/2022 CLINICAL DATA:  Follow-up examination for stroke. EXAM: CT ANGIOGRAPHY HEAD AND NECK TECHNIQUE: Multidetector CT imaging of the head and neck was performed using the standard protocol during bolus administration of intravenous contrast. Multiplanar CT image reconstructions and MIPs were obtained to evaluate the vascular anatomy. Carotid stenosis measurements (when applicable) are obtained utilizing NASCET criteria, using the distal internal carotid diameter as the denominator. RADIATION DOSE REDUCTION: This exam was performed according to the departmental dose-optimization program which includes automated exposure control, adjustment of the mA and/or kV according to patient size and/or use of iterative reconstruction technique. CONTRAST:  76m OMNIPAQUE IOHEXOL 350 MG/ML SOLN COMPARISON:  Prior CT and MRI from earlier the same evening. FINDINGS: CT HEAD FINDINGS Brain: Previously identified left thalamic capsular infarct again noted, stable. No associated hemorrhage or mass effect. No other acute large vessel territory infarct or intracranial hemorrhage. Chronic changes of underlying whole-brain radiation again noted. No visible mass lesion by CT. No mass effect or midline shift. No hydrocephalus or extra-axial fluid collection. Vascular: No hyperdense vessel. Skull: Scalp soft tissues and calvarium demonstrate no acute finding. Sinuses/Orbits: Globes orbital soft tissues within normal limits. Chronic left maxillary sinusitis noted. No mastoid effusion. Other: None. Review of the MIP images confirms the above findings CTA NECK FINDINGS Aortic arch: Visualized aortic arch normal caliber with standard 3 vessel morphology. No stenosis about the origin the great vessels. Right carotid system: Right common and internal carotid arteries widely patent without stenosis, dissection or occlusion. Left carotid system:  Left common and internal carotid arteries widely patent without stenosis, dissection or occlusion. Vertebral arteries: Both vertebral arteries arise from the subclavian arteries. No proximal subclavian artery stenosis. Both vertebral arteries widely patent without stenosis, dissection or occlusion. Skeleton: No discrete or worrisome osseous lesions. Moderate spondylosis noted at C5-6 and C6-7. Other neck: No other acute soft tissue abnormality within the neck. Upper chest: Visualized upper chest demonstrates no acute finding. Review of the MIP images confirms the above findings CTA HEAD FINDINGS Anterior circulation: Both internal carotid arteries patent to the termini without stenosis. A1 segments patent bilaterally. 2 mm outpouching arising from the left aspect of the anterior communicating artery complex, suspicious for a small aneurysm (series 11, image 267). Both ACAs widely patent distally. No M1 stenosis or occlusion. Normal MCA bifurcations. No proximal MCA branch occlusion. Distal MCA branches perfused and symmetric. Posterior circulation: Both V4 segments patent to the vertebrobasilar junction without stenosis. Both PICA origins patent and normal. Basilar widely patent to its distal aspect without stenosis. Superior cerebellar arteries patent bilaterally. Both PCAs primarily supplied via the basilar and are well perfused to there distal aspects. Venous  sinuses: Patent allowing for timing the contrast bolus. Anatomic variants: None significant. Review of the MIP images confirms the above findings IMPRESSION: CT HEAD IMPRESSION: 1. Acute left thalamic capsular infarct, better seen on prior brain MRI. No associated hemorrhage or mass effect. 2. No other acute intracranial abnormality. 3. Underlying changes of prior whole-brain radiation, stable. 4. Chronic left maxillary sinusitis. CTA HEAD AND NECK IMPRESSION: 1. Negative CTA of the head and neck for large vessel occlusion or other emergent finding. No  significant atheromatous disease for age. No hemodynamically significant or correctable stenosis. 2. 2 mm outpouching arising from the anterior communicating artery complex, suspicious for a small aneurysm. Attention at follow-up recommended. Electronically Signed   By: Jeannine Boga M.D.   On: 04/09/2022 01:07   MR BRAIN W WO CONTRAST  Result Date: 04/08/2022 CLINICAL DATA:  Initial evaluation for acute ataxia. History of metastatic melanoma, status post whole-brain radiation. EXAM: MRI HEAD WITHOUT AND WITH CONTRAST TECHNIQUE: Multiplanar, multiecho pulse sequences of the brain and surrounding structures were obtained without and with intravenous contrast. CONTRAST:  6.5 cc of Vueway COMPARISON:  Prior CT from earlier the same day as well as prior brain MRI from 10/04/2020. FINDINGS: Brain: Examination degraded by motion artifact. Cerebral volume stable. Confluent T2/FLAIR hyperintensity involving the supratentorial cerebral white matter, consistent with post radiation changes. Scattered foci of susceptibility artifact, consistent with small chronic micro hemorrhages, also consistent with prior radiation therapy. Slightly more prominent area of chronic hemorrhage at the mesial right occipital lobe, also stable. Small focus of T1 hyperintensity with subtle asymmetric enhancement involving the medial right thalamus, less conspicuous as compared to prior exam, compatible with treated disease. Similarly, previously seen subtle enhancement involving the mesial right occipital lobe is also less conspicuous, also consistent with treated disease. No other new enhancement or findings to suggest disease progression. 1.3 cm acute ischemic infarct present at the left thalamic capsular region (series 5, image 24). No associated hemorrhage or mass effect. No other evidence for acute or subacute ischemia. Gray-white matter differentiation otherwise maintained. No acute intracranial hemorrhage. Tiny remote right  cerebellar infarct noted. No other mass lesion, mass effect or midline shift. No hydrocephalus or extra-axial fluid collection. Pituitary gland and suprasellar region within normal limits. No other abnormal enhancement. Vascular: Major intracranial vascular flow voids are maintained. Skull and upper cervical spine: Craniocervical junction within normal limits. Bone marrow signal intensity normal. No focal marrow replacing lesion. No scalp soft tissue abnormality. Sinuses/Orbits: Globes orbital soft tissues within normal limits. Chronic left maxillary sinusitis. Trace left mastoid effusion, of doubtful significance. Other: None. IMPRESSION: 1. 1.3 cm acute ischemic nonhemorrhagic left thalamocapsular infarct. 2. Residual subtle enhancement involving the mesial right thalamus and right occipital lobe, decreased in conspicuity as compared to prior MRI, and now only faintly visible. Findings are consistent with treated disease. No other evidence for interval disease progression or active disease elsewhere within the brain. 3. Underlying changes of chronic whole-brain radiation. Electronically Signed   By: Jeannine Boga M.D.   On: 04/08/2022 23:09   DG Pelvis 1-2 Views  Result Date: 04/08/2022 CLINICAL DATA:  [place], history of metastatic melanoma EXAM: PELVIS - 1-2 VIEW COMPARISON:  10/17/2021 FINDINGS: Single frontal view of the pelvis includes both hips. There are no acute or destructive bony abnormalities. Stable sclerotic focus within the right iliac crest compatible with bone island. Joint spaces are well preserved. Alignment is anatomic. Soft tissues are normal. IMPRESSION: 1. Unremarkable pelvis and bilateral hips. Electronically Signed   By:  Randa Ngo M.D.   On: 04/08/2022 16:07   DG Chest 2 View  Result Date: 04/08/2022 CLINICAL DATA:  51 year old female presenting with difficulty ambulating post fall. EXAM: CHEST - 2 VIEW COMPARISON:  CT of the chest abdomen and pelvis of February 10, 2022.  October 16, 2021 chest radiograph. FINDINGS: Cardiomediastinal contours and hilar structures are stable. Mild cardiomegaly. No consolidation. No pleural effusion. No pneumothorax. On limited assessment no acute skeletal findings. IMPRESSION: Mild cardiomegaly without acute cardiopulmonary disease. Electronically Signed   By: Zetta Bills M.D.   On: 04/08/2022 16:07   CT Cervical Spine Wo Contrast  Result Date: 04/08/2022 CLINICAL DATA:  Neck trauma, dangerous injury mechanism (Age 65-64y). Mobility/ambulation difficulties. EXAM: CT CERVICAL SPINE WITHOUT CONTRAST TECHNIQUE: Multidetector CT imaging of the cervical spine was performed without intravenous contrast. Multiplanar CT image reconstructions were also generated. RADIATION DOSE REDUCTION: This exam was performed according to the departmental dose-optimization program which includes automated exposure control, adjustment of the mA and/or kV according to patient size and/or use of iterative reconstruction technique. COMPARISON:  11/05/2021 neck CT. FINDINGS: Substantially motion degraded scan, limiting assessment. Alignment: Straightening of the cervical spine. No facet subluxation. Dens is well positioned between the lateral masses of C1. Mild 2 mm anterolisthesis at C4-5. Skull base and vertebrae: No acute fracture. No primary bone lesion or focal pathologic process. Soft tissues and spinal canal: No prevertebral edema. No visible canal hematoma. Disc levels: Mild-to-moderate multilevel cervical degenerative disc disease, most prominent at C5-6. Moderate bilateral facet arthropathy. Unable to accurately assess for foraminal stenosis due to motion degradation. Upper chest: No acute abnormality. Other: Visualized mastoid air cells appear clear. No discrete thyroid nodules. No pathologically enlarged cervical nodes. IMPRESSION: 1. Substantially motion degraded scan, limiting assessment. No gross evidence of cervical spine fracture or facet subluxation. 2.  Mild-to-moderate multilevel degenerative changes in the cervical spine as described. 3. Mild 2 mm anterolisthesis at C4-5, probably degenerative. Electronically Signed   By: Ilona Sorrel M.D.   On: 04/08/2022 16:06   CT Head Wo Contrast  Result Date: 04/08/2022 CLINICAL DATA:  Trauma EXAM: CT HEAD WITHOUT CONTRAST TECHNIQUE: Contiguous axial images were obtained from the base of the skull through the vertex without intravenous contrast. RADIATION DOSE REDUCTION: This exam was performed according to the departmental dose-optimization program which includes automated exposure control, adjustment of the mA and/or kV according to patient size and/or use of iterative reconstruction technique. COMPARISON:  Nov 04, 2021 CT Brain FINDINGS: Brain: Redemonstrated sequela of severe chronic microvascular ischemic change with interval development of a new hypodensity in the left internal capsule (series 3, image 17). Size and shape of the ventricular system is unchanged compared to prior exam. Redemonstrated generalized volume loss. No evidence of hemorrhage, extra-axial collection or mass lesion/mass effect. Vascular: No hyperdense vessel or unexpected calcification. Skull: Negative for fracture or focal lesion. Sinuses/Orbits: No acute finding. Other: None. IMPRESSION: 1. No CT evidence of intracranial injury. 2. New hypodensity in the left internal capsule compared to 11/04/21. Given patient's history, this is suspicious for a new infarct. Electronically Signed   By: Marin Roberts M.D.   On: 04/08/2022 16:05     Subjective: No acute issues or events overnight denies nausea vomiting diarrhea constipation any fevers chills or chest pain   Discharge Exam: Vitals:   04/13/22 0145 04/13/22 0456  BP: 132/87 (!) 98/57  Pulse: 71 76  Resp: 12 15  Temp: 97.9 F (36.6 C) 97.6 F (36.4 C)  SpO2: 100%  98%   Vitals:   04/12/22 1458 04/12/22 2006 04/13/22 0145 04/13/22 0456  BP: 134/89 (!) 138/97 132/87 (!) 98/57   Pulse: 81 89 71 76  Resp: '18 16 12 15  '$ Temp: 98.1 F (36.7 C) 98 F (36.7 C) 97.9 F (36.6 C) 97.6 F (36.4 C)  TempSrc: Oral Oral Oral Oral  SpO2: 98% 97% 100% 98%  Weight:      Height:        General: Pt is alert, awake, not in acute distress Cardiovascular: RRR, S1/S2 +, no rubs, no gallops Respiratory: CTA bilaterally, no wheezing, no rhonchi Abdominal: Soft, NT, ND, bowel sounds + Extremities: no edema, no cyanosis    The results of significant diagnostics from this hospitalization (including imaging, microbiology, ancillary and laboratory) are listed below for reference.     Microbiology: No results found for this or any previous visit (from the past 240 hour(s)).   Labs: BNP (last 3 results) No results for input(s): "BNP" in the last 8760 hours. Basic Metabolic Panel: Recent Labs  Lab 04/08/22 1623 04/09/22 0434 04/10/22 0600 04/12/22 0653  NA 137 137 132* 137  K 3.8 3.2* 3.5 4.0  CL 108 103 101 103  CO2 22 24 20* 25  GLUCOSE 103* 118* 96 93  BUN '19 14 11 16  '$ CREATININE 0.64 0.71 0.66 0.63  CALCIUM 9.1 9.8 8.9 9.8  MG  --  1.9  --   --    Liver Function Tests: Recent Labs  Lab 04/09/22 0434  AST 26  ALT 21  ALKPHOS 90  BILITOT 0.6  PROT 6.7  ALBUMIN 3.7   No results for input(s): "LIPASE", "AMYLASE" in the last 168 hours. No results for input(s): "AMMONIA" in the last 168 hours. CBC: Recent Labs  Lab 04/08/22 1623 04/09/22 0434 04/10/22 0600 04/12/22 0653  WBC 4.6 4.9 4.6 4.0  NEUTROABS 2.9 2.9  --   --   HGB 13.1 12.5 12.3 12.6  HCT 40.5 36.9 36.2 37.9  MCV 84.7 81.6 82.3 83.3  PLT 74* 62* 64* 60*   Cardiac Enzymes: No results for input(s): "CKTOTAL", "CKMB", "CKMBINDEX", "TROPONINI" in the last 168 hours. BNP: Invalid input(s): "POCBNP" CBG: Recent Labs  Lab 04/08/22 1537  GLUCAP 114*   D-Dimer No results for input(s): "DDIMER" in the last 72 hours. Hgb A1c No results for input(s): "HGBA1C" in the last 72 hours. Lipid  Profile No results for input(s): "CHOL", "HDL", "LDLCALC", "TRIG", "CHOLHDL", "LDLDIRECT" in the last 72 hours. Thyroid function studies No results for input(s): "TSH", "T4TOTAL", "T3FREE", "THYROIDAB" in the last 72 hours.  Invalid input(s): "FREET3" Anemia work up No results for input(s): "VITAMINB12", "FOLATE", "FERRITIN", "TIBC", "IRON", "RETICCTPCT" in the last 72 hours. Urinalysis    Component Value Date/Time   COLORURINE YELLOW 04/08/2022 1530   APPEARANCEUR CLEAR 04/08/2022 1530   LABSPEC 1.017 04/08/2022 1530   PHURINE 7.0 04/08/2022 1530   GLUCOSEU NEGATIVE 04/08/2022 1530   HGBUR NEGATIVE 04/08/2022 1530   BILIRUBINUR NEGATIVE 04/08/2022 1530   BILIRUBINUR negative 02/24/2022 1402   KETONESUR NEGATIVE 04/08/2022 1530   PROTEINUR NEGATIVE 04/08/2022 1530   UROBILINOGEN 1.0 02/24/2022 1402   UROBILINOGEN 0.2 01/23/2021 1715   NITRITE NEGATIVE 04/08/2022 1530   LEUKOCYTESUR NEGATIVE 04/08/2022 1530   Sepsis Labs Recent Labs  Lab 04/08/22 1623 04/09/22 0434 04/10/22 0600 04/12/22 0653  WBC 4.6 4.9 4.6 4.0   Microbiology No results found for this or any previous visit (from the past 240 hour(s)).   Time coordinating  discharge: Over 30 minutes  SIGNED:   Little Ishikawa, DO [facility] 04/13/2022, 8:07 AM Pager   If 7PM-7AM, please contact night-coverage [email or web address]

## 2022-04-13 NOTE — TOC Transition Note (Signed)
Transition of Care [facility]) - CM/SW Discharge Note   Patient Details  Name: Kaitlyn Duncan MRN: [id number] Date of Birth: 12-24-1970  Transition of Care [facility]) CM/SW Contact:  Pollie Friar, RN Phone Number: 04/13/2022, 1:02 PM   Clinical Narrative:    Pt is discharging back to her hotel [facility]). Pts ACT team will provide transportation and make sure she is set up at the hotel and has her medications. Walker and shower seat for home at the bedside.  Information provided to pts legal guardian.    Final next level of care: Home w Home Health Services Barriers to Discharge: No Barriers Identified   Patient Goals and CMS Choice   CMS Medicare.gov Compare Post Acute Care list provided to:: Legal Guardian Choice offered to / list presented to : Patient, [place] / Guardian  Discharge Placement                       Discharge Plan and Services   Discharge Planning Services: CM Consult Post Acute Care Choice: Home Health, Durable Medical Equipment          DME Arranged: Shower stool, Walker rolling DME Agency: [employer] Date DME Agency Contacted: 04/12/22   Representative spoke with at DME Agency: Mardene Celeste HH Arranged: PT, OT [facility] Agency: [place] Date [place]: 04/12/22   Representative spoke with at [place]: [place] ([place]) Interventions Food Insecurity Interventions: Inpatient TOC (pt has ACT team that assists her with shopping. Pt uses heat up meals at home) Housing Interventions: Inpatient TOC (Pt is in a hotel that ACT and guardian have gotten for her. they visit her often and make sure she has a place to stay) Transportation Interventions: Inpatient TOC (pts ACT team provides some transportation. she also uses [employer] and cabs as needed)   Readmission Risk Interventions     No data to display

## 2022-04-16 ENCOUNTER — Telehealth: Payer: Self-pay | Admitting: *Deleted

## 2022-04-16 ENCOUNTER — Encounter: Payer: Self-pay | Admitting: *Deleted

## 2022-04-16 NOTE — Patient Outreach (Signed)
  Care Coordination [facility] Note Transition Care Management Unsuccessful Follow-up Telephone Call  Date of discharge and from where:  Tuesday, [facility], [facility]; Acute CVA: patient has legal guardian, [name] [phone])  Attempts:  1st Attempt  Reason for unsuccessful TCM follow-up call:  Left voice message- for legal guardian, requesting call back  Oneta Rack, RN, BSN, CCRN Alumnus RN CM Care Coordination/ Transition of [place] Management [phone]: direct office

## 2022-04-19 ENCOUNTER — Telehealth: Payer: Self-pay | Admitting: *Deleted

## 2022-04-19 NOTE — Patient Outreach (Signed)
  Care Coordination [facility] Note Transition Care Management Follow-up Telephone Call Date of discharge and from where: [facility] [place] [phone] How have you been since you were released from the hospital? Patient had episode of BP elevation when [facility] came [phone] Any questions or concerns? Yes HHC stated that patient was on bp medication Items Reviewed: Did the pt receive and understand the discharge instructions provided? Yes  Medications obtained and verified? Yes RN went over all medication ordered and d/cd Other? No  Any new allergies since your discharge? No  Dietary orders reviewed? Yes Do you have support at home? Yes  patient lives in a hotel. She has a guardian that checks on her.   Home Care and Equipment/Supplies: Were home health services ordered? yes If so, what is the name of the agency? [facility]  Has the agency set up a time to come to the patient's home? yes Were any new equipment or medical supplies ordered?  Yes: rolling walker and shower chair delivered in hospital  What is the name of the medical supply agency? [employer] Were you able to get the supplies/equipment? yes Do you have any questions related to the use of the equipment or supplies? No  Functional Questionnaire: (I = Independent and D = Dependent) ADLs: I  Bathing/Dressing- I working with OT  Meal Prep- I  Eating- I  Maintaining continence- I  Transferring/Ambulation- D using walker and working with PT  Managing Meds- I  Follow up appointments reviewed:  PCP Hospital f/u appt confirmed? No  Legal Guardian will schedule appt Specialist Hospital f/u appt confirmed? Yes  Frann Rider  [phone] 10:15 Are transportation arrangements needed? No  arranged by ACT and guardian If their condition worsens, is the pt aware to call PCP or go to the Emergency Dept.? Yes Was the patient provided with contact information for the PCP's office or ED? Yes Was to pt encouraged to call back with questions or concerns? Yes  SDOH  assessments and interventions completed:   Yes  Care Coordination Interventions Activated:  Yes   Care Coordination Interventions:  No Care Coordination interventions needed at this time.   Encounter Outcome:  Pt. Visit Completed    [place] Management [phone]

## 2022-04-20 ENCOUNTER — Telehealth: Payer: Self-pay | Admitting: Family Medicine

## 2022-04-20 NOTE — Telephone Encounter (Signed)
Ok for verbal orders ?

## 2022-04-20 NOTE — Telephone Encounter (Signed)
Kaitlyn Duncan with Kaitlyn Duncan needs verbal orders for physical therapy - 2 times per week for three weeks and 1 time per week for one week. Call back number is [phone].

## 2022-04-20 NOTE — Telephone Encounter (Signed)
[place] for PT verbal orders?

## 2022-04-20 NOTE — Telephone Encounter (Signed)
Verbal orders for occupational therapy 2 times a week 1 week, 1 time a week for 1 week  FOR:  ADL transfers, exercise, health promotion, pain control  Patient states that her kidney doctor gave her a sample package of Gemtesa 75 mg.  - Patient is taking this now.   Agency may receive orders from dr. Para March - Kidney doctor.

## 2022-04-21 NOTE — Telephone Encounter (Signed)
Called Shanon Brow and was able to give ok for verbal orders

## 2022-04-21 NOTE — Telephone Encounter (Signed)
Called Don and ok'd verbal orders

## 2022-04-22 ENCOUNTER — Ambulatory Visit: Payer: Medicare Other | Admitting: Oncology

## 2022-04-22 DIAGNOSIS — N3281 Overactive bladder: Secondary | ICD-10-CM | POA: Diagnosis not present

## 2022-04-28 ENCOUNTER — Encounter: Payer: Self-pay | Admitting: Family Medicine

## 2022-04-28 ENCOUNTER — Ambulatory Visit (INDEPENDENT_AMBULATORY_CARE_PROVIDER_SITE_OTHER): Payer: Medicare Other | Admitting: Family Medicine

## 2022-04-28 VITALS — BP 128/76 | HR 80 | Temp 97.6°F | Ht 60.0 in | Wt 143.0 lb

## 2022-04-28 DIAGNOSIS — J309 Allergic rhinitis, unspecified: Secondary | ICD-10-CM

## 2022-04-28 DIAGNOSIS — I639 Cerebral infarction, unspecified: Secondary | ICD-10-CM

## 2022-04-28 DIAGNOSIS — Z09 Encounter for follow-up examination after completed treatment for conditions other than malignant neoplasm: Secondary | ICD-10-CM

## 2022-04-28 DIAGNOSIS — R296 Repeated falls: Secondary | ICD-10-CM

## 2022-04-28 DIAGNOSIS — D696 Thrombocytopenia, unspecified: Secondary | ICD-10-CM

## 2022-04-28 NOTE — Patient Instructions (Addendum)
Dermatology offices  [facility]: [phone] Address: [address], [place], [place] [address] [place], [place] [address]  [facility]: Phone #: [phone] Address: [address], [place], [place] [address]  [facility]: Phone: [phone]  Address: [address], [place],  [address]  DASH Eating Plan [place] stands for Dietary Approaches to Stop Hypertension. The DASH eating plan is a healthy eating plan that has been shown to: Reduce high blood pressure (hypertension). Reduce your risk for type 2 diabetes, heart disease, and stroke. Help with weight loss. What are tips for following this plan? Reading food labels Check food labels for the amount of salt (sodium) per serving. Choose foods with less than 5 percent of the Daily Value of sodium. Generally, foods with less than 300 milligrams (mg) of sodium per serving fit into this eating plan. To find whole grains, look for the word "whole" as the first word in the ingredient list. Shopping Buy products labeled as "low-sodium" or "no salt added." Buy fresh foods. Avoid canned foods and pre-made or frozen meals. Cooking Avoid adding salt when cooking. Use salt-free seasonings or herbs instead of table salt or sea salt. Check with your health care provider or pharmacist before using salt substitutes. Do not fry foods. Cook foods using healthy methods such as baking, boiling, grilling, roasting, and broiling instead. Cook with heart-healthy oils, such as olive, canola, avocado, soybean, or sunflower oil. Meal planning  Eat a balanced diet that includes: 4 or more servings of fruits and 4 or more servings of vegetables each day. Try to fill one-half of your plate with fruits and vegetables. 6-8 servings of whole grains each day. Less than 6 oz (170 g) of lean meat, poultry, or fish each day. A 3-oz (85-g) serving of meat is about the same size as a deck of cards. One egg equals 1 oz  (28 g). 2-3 servings of low-fat dairy each day. One serving is 1 cup (237 mL). 1 serving of nuts, seeds, or beans 5 times each week. 2-3 servings of heart-healthy fats. Healthy fats called omega-3 fatty acids are found in foods such as walnuts, flaxseeds, fortified milks, and eggs. These fats are also found in cold-water fish, such as sardines, salmon, and mackerel. Limit how much you eat of: Canned or prepackaged foods. Food that is high in trans fat, such as some fried foods. Food that is high in saturated fat, such as fatty meat. Desserts and other sweets, sugary drinks, and other foods with added sugar. Full-fat dairy products. Do not salt foods before eating. Do not eat more than 4 egg yolks a week. Try to eat at least 2 vegetarian meals a week. Eat more home-cooked food and less restaurant, buffet, and fast food. Lifestyle When eating at a restaurant, ask that your food be prepared with less salt or no salt, if possible. If you drink alcohol: Limit how much you use to: 0-1 drink a day for women who are not pregnant. 0-2 drinks a day for men. Be aware of how much alcohol is in your drink. In the [place]., one drink equals one 12 oz bottle of beer (355 mL), one 5 oz glass of wine (148 mL), or one 1 oz glass of hard liquor (44 mL). General information Avoid eating more than 2,300 mg of salt a day. If you have hypertension, you may need to reduce your sodium intake to 1,500 mg a day. Work with your health care provider to maintain a healthy body weight or to  lose weight. Ask what an ideal weight is for you. Get at least 30 minutes of exercise that causes your heart to beat faster (aerobic exercise) most days of the week. Activities may include walking, swimming, or biking. Work with your health care provider or dietitian to adjust your eating plan to your individual calorie needs. What foods should I eat? Fruits All fresh, dried, or frozen fruit. Canned fruit in natural juice (without  added sugar). Vegetables Fresh or frozen vegetables (raw, steamed, roasted, or grilled). Low-sodium or reduced-sodium tomato and vegetable juice. Low-sodium or reduced-sodium tomato sauce and tomato paste. Low-sodium or reduced-sodium canned vegetables. Grains Whole-grain or whole-wheat bread. Whole-grain or whole-wheat pasta. Brown rice. [place]. Bulgur. Whole-grain and low-sodium cereals. Pita bread. Low-fat, low-sodium crackers. Whole-wheat flour tortillas. Meats and other proteins Skinless chicken or [place]. Ground chicken or [place]. Pork with fat trimmed off. Fish and seafood. Egg whites. Dried beans, peas, or lentils. Unsalted nuts, nut butters, and seeds. Unsalted canned beans. Lean cuts of beef with fat trimmed off. Low-sodium, lean precooked or cured meat, such as sausages or meat loaves. Dairy Low-fat (1%) or fat-free (skim) milk. Reduced-fat, low-fat, or fat-free cheeses. Nonfat, low-sodium ricotta or cottage cheese. Low-fat or nonfat yogurt. Low-fat, low-sodium cheese. Fats and oils Soft margarine without trans fats. Vegetable oil. Reduced-fat, low-fat, or light mayonnaise and salad dressings (reduced-sodium). Canola, safflower, olive, avocado, soybean, and sunflower oils. Avocado. Seasonings and condiments Herbs. Spices. Seasoning mixes without salt. Other foods Unsalted popcorn and pretzels. Fat-free sweets. The items listed above may not be a complete list of foods and beverages you can eat. Contact a dietitian for more information. What foods should I avoid? Fruits Canned fruit in a light or heavy syrup. Fried fruit. Fruit in cream or butter sauce. Vegetables Creamed or fried vegetables. Vegetables in a cheese sauce. Regular canned vegetables (not low-sodium or reduced-sodium). Regular canned tomato sauce and paste (not low-sodium or reduced-sodium). Regular tomato and vegetable juice (not low-sodium or reduced-sodium). Angie Fava. Olives. Grains Baked goods made with fat,  such as croissants, muffins, or some breads. Dry pasta or rice meal packs. Meats and other proteins Fatty cuts of meat. Ribs. Fried meat. Berniece Salines. Bologna, salami, and other precooked or cured meats, such as sausages or meat loaves. Fat from the back of a pig (fatback). Bratwurst. Salted nuts and seeds. Canned beans with added salt. Canned or smoked fish. Whole eggs or egg yolks. Chicken or [place] with skin. Dairy Whole or 2% milk, cream, and half-and-half. Whole or full-fat cream cheese. Whole-fat or sweetened yogurt. Full-fat cheese. Nondairy creamers. Whipped toppings. Processed cheese and cheese spreads. Fats and oils Butter. Stick margarine. Lard. Shortening. Ghee. Bacon fat. Tropical oils, such as coconut, palm kernel, or palm oil. Seasonings and condiments Onion salt, garlic salt, seasoned salt, table salt, and sea salt. Worcestershire sauce. Tartar sauce. Barbecue sauce. Teriyaki sauce. Soy sauce, including reduced-sodium. Steak sauce. Canned and packaged gravies. Fish sauce. Oyster sauce. Cocktail sauce. Store-bought horseradish. Ketchup. Mustard. Meat flavorings and tenderizers. Bouillon cubes. Hot sauces. Pre-made or packaged marinades. Pre-made or packaged taco seasonings. Relishes. Regular salad dressings. Other foods Salted popcorn and pretzels. The items listed above may not be a complete list of foods and beverages you should avoid. Contact a dietitian for more information. Where to find more information National Heart, Lung, and Blood Institute: https://wilson-eaton.com/ American Heart Association: www.heart.org Academy of Nutrition and Dietetics: www.eatright.[place]: www.kidney.org Summary The DASH eating plan is a healthy eating plan that has been  shown to reduce high blood pressure (hypertension). It may also reduce your risk for type 2 diabetes, heart disease, and stroke. When on the DASH eating plan, aim to eat more fresh fruits and vegetables, whole grains,  lean proteins, low-fat dairy, and heart-healthy fats. With the DASH eating plan, you should limit salt (sodium) intake to 2,300 mg a day. If you have hypertension, you may need to reduce your sodium intake to 1,500 mg a day. Work with your health care provider or dietitian to adjust your eating plan to your individual calorie needs. This information is not intended to replace advice given to you by your health care provider. Make sure you discuss any questions you have with your health care provider. Document Revised: 05/25/2019 Document Reviewed: 05/25/2019 Elsevier Patient Education  [place].

## 2022-04-28 NOTE — Progress Notes (Signed)
Subjective:     Patient ID: Kaitlyn Duncan, female    DOB: 11/28/1970, 51 y.o.   MRN: [id number]  Chief Complaint  Patient presents with   Hospitalization Follow-up    HPI Patient is in today for follow up post hospital discharge.   Hospitalized from 04/08/2022-04/13/2022 for acute ischemic stroke.  She is on 22 asa only per neuro due to thrombocytopenia.   No bleeding.   Denies fever, chills, headache, chest pain, palpitations, shortness of breath, abdominal pain, N/V/D, urinary symptoms, LE edema.   Denies falls since hospital discharge. Using a four-legged cane. Furniture surfing.  She also has a walker at home.   She is getting PT/OT.   Neurology appt 05/20/2022.   Lives in a hotel. States she has a social working and a Counselling psychologist helping her find a duplex.  Only has a small frig and microwave.   Family members in [place]. They come visit at times. She is closest to her sister, Kaitlyn Duncan, who lives in [place]. She talks her to her sister and father on the phone often.     Health Maintenance Due  Topic Date Due   Medicare Annual Wellness (AWV)  Never done   COVID-19 Vaccine (1) Never done   HIV Screening  Never done   Hepatitis C Screening  Never done   PAP SMEAR-Modifier  Never done   MAMMOGRAM  02/18/2022    Past Medical History:  Diagnosis Date   Allergy    Anemia    Gallstones    GERD (gastroesophageal reflux disease)    Hemorrhoids    melanoma with met dz dx'd 01/2018   brain and adrenal   Seasonal allergies     Past Surgical History:  Procedure Laterality Date   BIOPSY  01/28/2018   Procedure: BIOPSY;  Surgeon: Carman Ching, MD;  Location: [facility];  Service: Endoscopy;;   BREAST BIOPSY Left    ESOPHAGOGASTRODUODENOSCOPY N/A 01/28/2018   Procedure: ESOPHAGOGASTRODUODENOSCOPY (EGD);  Surgeon: Carman Ching, MD;  Location: [facility];  Service: Endoscopy;  Laterality: N/A;   MOUTH SURGERY     OVARY SURGERY      removed "something"    Family History  Problem Relation Age of Onset   Melanoma Mother    Hypertension Father    Breast cancer Maternal Aunt    Breast cancer Paternal Aunt    Diabetes Paternal Aunt    Breast cancer Maternal Grandmother    Breast cancer Paternal Grandmother    Diabetes Paternal Grandmother    Colon cancer Neg Hx    Esophageal cancer Neg Hx    Pancreatic cancer Neg Hx    Stomach cancer Neg Hx    Liver disease Neg Hx    Rectal cancer Neg Hx     Social History   Socioeconomic History   Marital status: Single    Spouse name: Not on file   Number of children: Not on file   Years of education: Not on file   Highest education level: Not on file  Occupational History   Not on file  Tobacco Use   Smoking status: Never   Smokeless tobacco: Never  Vaping Use   Vaping Use: Never used  Substance and Sexual Activity   Alcohol use: No   Drug use: No   Sexual activity: Not Currently  Other Topics Concern   Not on file  Social History Narrative   Not on file   Social Determinants of Health   Financial Resource  Strain: Not on file  Food Insecurity: Food Insecurity Present (04/09/2022)   Hunger Vital Sign    Worried About Running Out of Food in the Last Year: Sometimes true    Ran Out of Food in the Last Year: Sometimes true  Transportation Needs: Unmet Transportation Needs (04/09/2022)   PRAPARE - Hydrologist (Medical): Yes    Lack of Transportation (Non-Medical): Yes  Physical Activity: Not on file  Stress: Not on file  Social Connections: Not on file  Intimate Partner Violence: Not At Risk (04/09/2022)   Humiliation, Afraid, Rape, and Kick questionnaire    Fear of Current or Ex-Partner: No    Emotionally Abused: No    Physically Abused: No    Sexually Abused: No    Outpatient Medications Prior to Visit  Medication Sig Dispense Refill   aspirin EC 81 MG tablet Take 1 tablet (81 mg total) by mouth daily. Swallow whole. 30  tablet 12   linaclotide (LINZESS) 72 MCG capsule Take 2 capsules (144 mcg total) by mouth daily before breakfast. 60 capsule 1   loratadine (CLARITIN) 10 MG tablet Take 1 tablet (10 mg total) by mouth daily. 30 tablet 0   milk thistle 175 MG tablet Take 175 mg by mouth daily.     Multiple Vitamins-Minerals (HAIR SKIN AND NAILS FORMULA) TABS Take 2 tablets by mouth daily.     Multiple Vitamins-Minerals (MULTIVITAMIN GUMMIES ADULT PO) Take 1 tablet by mouth.     rosuvastatin (CRESTOR) 40 MG tablet Take 1 tablet (40 mg total) by mouth daily. 30 tablet 3   Simethicone (GAS RELIEF 125 MAX ST PO) Take 1 tablet by mouth as needed.     Turmeric Curcumin 500 MG CAPS Take 1 capsule by mouth daily in the afternoon.     No facility-administered medications prior to visit.    No Known Allergies  ROS     Objective:    Physical Exam Constitutional:      General: She is not in acute distress.    Appearance: She is not ill-appearing.  HENT:     Mouth/Throat:     Mouth: Mucous membranes are moist.  Eyes:     Extraocular Movements: Extraocular movements intact.     Conjunctiva/sclera: Conjunctivae normal.     Pupils: Pupils are equal, round, and reactive to light.  Cardiovascular:     Rate and Rhythm: Normal rate and regular rhythm.  Pulmonary:     Effort: Pulmonary effort is normal.     Breath sounds: Normal breath sounds.  Skin:    General: Skin is warm and dry.  Neurological:     General: No focal deficit present.     Mental Status: She is alert and oriented to person, place, and time.     Cranial Nerves: No cranial nerve deficit.     Motor: No weakness.     Coordination: Coordination normal.     Gait: Gait abnormal.  Psychiatric:        Mood and Affect: Mood normal.        Behavior: Behavior normal.     BP 128/76 (BP Location: Left Arm, Patient Position: Sitting, Cuff Size: Large)   Pulse 80   Temp 97.6 F (36.4 C) (Temporal)   Ht 5' (1.524 m)   Wt 143 lb (64.9 kg)   SpO2  98%   BMI 27.93 kg/m  Wt Readings from Last 3 Encounters:  04/28/22 143 lb (64.9 kg)  04/09/22 141 lb 8.6  oz (64.2 kg)  03/17/22 145 lb 9.6 oz (66 kg)        Assessment & Plan:   Problem List Items Addressed This Visit       Cardiovascular and Mediastinum   Acute ischemic stroke [facility])    Reviewed notes from hospital stay and hospital discharge summary.  Reviewed lab and imaging results.  Medications reviewed.  Only on ASA 81 mg due to thrombocytopenia.  She is getting home OT and PT.  No new concerns today.  She will follow up with neurology as schedule next month.  Strict precautions to call 911 if she develops any new stroke symptoms.         Respiratory   Allergic rhinitis     Hematopoietic and Hemostatic   Thrombocytopenia (HCC)    Stable. No bleeding. On ASA 81 mg due to recent ischemic stroke.         Other   Frequent falls    Denies falling since d/c from hospital.       Other Visit Diagnoses     Hospital discharge follow-up    -  Primary       I am having Cephus Shelling. Domingo Cocking "[name]" maintain her Simethicone (GAS RELIEF 125 MAX ST PO), milk thistle, Turmeric Curcumin, Hair Skin and Nails Formula, Multiple Vitamins-Minerals (MULTIVITAMIN GUMMIES ADULT PO), aspirin EC, rosuvastatin, loratadine, and linaclotide.  No orders of the defined types were placed in this encounter.

## 2022-04-28 NOTE — Assessment & Plan Note (Signed)
Stable. No bleeding. On ASA 81 mg due to recent ischemic stroke.

## 2022-04-28 NOTE — Assessment & Plan Note (Addendum)
Reviewed notes from hospital stay and hospital discharge summary.  Reviewed lab and imaging results.  Medications reviewed.  Only on ASA 81 mg due to thrombocytopenia.  She is getting home OT and PT.  No new concerns today.  She will follow up with neurology as schedule next month.  Strict precautions to call 911 if she develops any new stroke symptoms.

## 2022-04-30 DIAGNOSIS — R6889 Other general symptoms and signs: Secondary | ICD-10-CM | POA: Diagnosis not present

## 2022-05-02 NOTE — Assessment & Plan Note (Signed)
Denies falling since d/c from hospital.

## 2022-05-03 DIAGNOSIS — H5203 Hypermetropia, bilateral: Secondary | ICD-10-CM | POA: Diagnosis not present

## 2022-05-07 ENCOUNTER — Telehealth: Payer: Self-pay

## 2022-05-07 NOTE — Telephone Encounter (Signed)
Incoming fax received from [facility] notifying PCP of missed visit on 04/23/22 due to "no answer to phone"

## 2022-05-08 ENCOUNTER — Encounter (HOSPITAL_BASED_OUTPATIENT_CLINIC_OR_DEPARTMENT_OTHER): Payer: Self-pay | Admitting: Emergency Medicine

## 2022-05-08 ENCOUNTER — Emergency Department (HOSPITAL_BASED_OUTPATIENT_CLINIC_OR_DEPARTMENT_OTHER): Payer: Medicare Other

## 2022-05-08 ENCOUNTER — Emergency Department (HOSPITAL_BASED_OUTPATIENT_CLINIC_OR_DEPARTMENT_OTHER)
Admission: EM | Admit: 2022-05-08 | Discharge: 2022-05-08 | Disposition: A | Discharge status: Home / Self Care | Payer: Medicare Other | Attending: Emergency Medicine | Admitting: Emergency Medicine

## 2022-05-08 DIAGNOSIS — R112 Nausea with vomiting, unspecified: Secondary | ICD-10-CM | POA: Diagnosis not present

## 2022-05-08 DIAGNOSIS — Z20822 Contact with and (suspected) exposure to covid-19: Secondary | ICD-10-CM | POA: Insufficient documentation

## 2022-05-08 DIAGNOSIS — R519 Headache, unspecified: Secondary | ICD-10-CM | POA: Diagnosis not present

## 2022-05-08 DIAGNOSIS — R0981 Nasal congestion: Secondary | ICD-10-CM | POA: Insufficient documentation

## 2022-05-08 DIAGNOSIS — Z7982 Long term (current) use of aspirin: Secondary | ICD-10-CM | POA: Diagnosis not present

## 2022-05-08 DIAGNOSIS — Z1152 Encounter for screening for COVID-19: Secondary | ICD-10-CM | POA: Insufficient documentation

## 2022-05-08 HISTORY — DX: Cerebral infarction, unspecified: I63.9

## 2022-05-08 LAB — CBC WITH DIFFERENTIAL/PLATELET
Abs Immature Granulocytes: 0.01 10*3/uL (ref 0.00–0.07)
Basophils Absolute: 0 10*3/uL (ref 0.0–0.1)
Basophils Relative: 0 %
Eosinophils Absolute: 0 10*3/uL (ref 0.0–0.5)
Eosinophils Relative: 0 %
HCT: 40.4 % (ref 36.0–46.0)
Hemoglobin: 13.5 g/dL (ref 12.0–15.0)
Immature Granulocytes: 0 %
Lymphocytes Relative: 25 %
Lymphs Abs: 1.4 10*3/uL (ref 0.7–4.0)
MCH: 27.2 pg (ref 26.0–34.0)
MCHC: 33.4 g/dL (ref 30.0–36.0)
MCV: 81.5 fL (ref 80.0–100.0)
Monocytes Absolute: 0.5 10*3/uL (ref 0.1–1.0)
Monocytes Relative: 10 %
Neutro Abs: 3.6 10*3/uL (ref 1.7–7.7)
Neutrophils Relative %: 65 %
Platelets: 63 10*3/uL — ABNORMAL LOW (ref 150–400)
RBC: 4.96 MIL/uL (ref 3.87–5.11)
RDW: 12.4 % (ref 11.5–15.5)
WBC: 5.5 10*3/uL (ref 4.0–10.5)
nRBC: 0 % (ref 0.0–0.2)

## 2022-05-08 LAB — BASIC METABOLIC PANEL
Anion gap: 8 (ref 5–15)
BUN: 18 mg/dL (ref 6–20)
CO2: 22 mmol/L (ref 22–32)
Calcium: 9 mg/dL (ref 8.9–10.3)
Chloride: 105 mmol/L (ref 98–111)
Creatinine, Ser: 0.49 mg/dL (ref 0.44–1.00)
GFR, Estimated: >60 mL/min (ref >60)
Glucose, Bld: 102 mg/dL — ABNORMAL HIGH (ref 70–99)
Potassium: 3.4 mmol/L — ABNORMAL LOW (ref 3.5–5.1)
Sodium: 135 mmol/L (ref 135–145)

## 2022-05-08 LAB — RESP PANEL BY RT-PCR (FLU A&B, COVID) ARPGX2
Influenza A by PCR: NEGATIVE
Influenza B by PCR: NEGATIVE
SARS Coronavirus 2 by RT PCR: NEGATIVE

## 2022-05-08 MED ORDER — METOCLOPRAMIDE HCL 5 MG/ML IJ SOLN
10.0000 mg | Freq: Once | INTRAMUSCULAR | Status: AC
  Administered 2022-05-08: 10 mg via INTRAVENOUS
  Filled 2022-05-08: qty 2

## 2022-05-08 MED ORDER — DIPHENHYDRAMINE HCL 50 MG/ML IJ SOLN
50.0000 mg | Freq: Once | INTRAMUSCULAR | Status: AC
  Administered 2022-05-08: 50 mg via INTRAVENOUS
  Filled 2022-05-08: qty 1

## 2022-05-08 NOTE — ED Provider Notes (Signed)
[place] EMERGENCY DEPARTMENT Provider Note   CSN: [id number] Arrival date & time: 05/08/22  1637     History  Chief Complaint  Patient presents with   Headache    Kaitlyn Duncan is a 51 y.o. female.  The history is provided by the patient and medical records. No language interpreter was used.  Headache    51 year old female significant history of schizoaffective disorder, malingering, frequent falls, brought here via EMS from home with complaints of headache.  Patient reports she woke up this morning with a headache.  Headache is described as a sinus headache primary to the front of face, intense, persistent, with associated light sensitivity, nausea, vomiting, and congestion.  No report of any fever or chills no runny nose sneezing coughing sore throat or ear pain chest pain trouble breathing abdominal pain focal numbness or focal weakness or rash.  She normally does not have headaches.  She reported having a light stroke a month ago affecting the right side of her body.  She walks with a cane.  She is on aspirin but not on any blood thinner medication.  She tries taking Tylenol and allergy medication earlier today without relief.  Home Medications Prior to Admission medications   Medication Sig Start Date End Date Taking? Authorizing Provider  aspirin EC 81 MG tablet Take 1 tablet (81 mg total) by mouth daily. Swallow whole. 04/13/22   Little Ishikawa, MD  linaclotide [facility]) 72 MCG capsule Take 2 capsules (144 mcg total) by mouth daily before breakfast. 04/13/22   Little Ishikawa, MD  loratadine (CLARITIN) 10 MG tablet Take 1 tablet (10 mg total) by mouth daily. 04/12/22   Little Ishikawa, MD  milk thistle 175 MG tablet Take 175 mg by mouth daily.    [provider]  Multiple Vitamins-Minerals (HAIR SKIN AND NAILS FORMULA) TABS Take 2 tablets by mouth daily.    [provider]  Multiple Vitamins-Minerals (MULTIVITAMIN GUMMIES  ADULT PO) Take 1 tablet by mouth.    [provider]  rosuvastatin (CRESTOR) 40 MG tablet Take 1 tablet (40 mg total) by mouth daily. 04/13/22   Little Ishikawa, MD  Simethicone (GAS RELIEF 125 MAX ST PO) Take 1 tablet by mouth as needed.    [provider]  Turmeric Curcumin 500 MG CAPS Take 1 capsule by mouth daily in the afternoon.    [provider]      Allergies    Patient has no known allergies.    Review of Systems   Review of Systems  Neurological:  Positive for headaches.  All other systems reviewed and are negative.   Physical Exam Updated Vital Signs BP (!) 150/102 (BP Location: Right Arm)   Pulse 93   Temp 99.4 F (37.4 C)   Resp (!) 24   SpO2 100%  Physical Exam Vitals and nursing note reviewed.  Constitutional:      Appearance: She is well-developed.     Comments: Patient is moaning appears uncomfortable but nontoxic.  HENT:     Head: Normocephalic and atraumatic.  Eyes:     Extraocular Movements: Extraocular movements intact.     Conjunctiva/sclera: Conjunctivae normal.     Pupils: Pupils are equal, round, and reactive to light.  Cardiovascular:     Rate and Rhythm: Normal rate and regular rhythm.  Pulmonary:     Effort: Pulmonary effort is normal.  Abdominal:     General: Bowel sounds are normal.  Palpations: Abdomen is soft.     Tenderness: There is no abdominal tenderness.  Musculoskeletal:        General: Normal range of motion.     Cervical back: Normal range of motion and neck supple. No rigidity.  Skin:    Findings: No rash.  Neurological:     Mental Status: She is alert.     GCS: GCS eye subscore is 4. GCS verbal subscore is 5. GCS motor subscore is 6.  Psychiatric:        Mood and Affect: Mood normal.     ED Results / Procedures / Treatments   Labs (all labs ordered are listed, but only abnormal results are displayed) Labs Reviewed  BASIC METABOLIC PANEL - Abnormal; Notable for the following  components:      Result Value   Potassium 3.4 (*)    Glucose, Bld 102 (*)    All other components within normal limits  CBC WITH DIFFERENTIAL/PLATELET - Abnormal; Notable for the following components:   Platelets 63 (*)    All other components within normal limits  RESP PANEL BY RT-PCR (FLU A&B, COVID) [id number]    EKG None  Radiology CT Head Wo Contrast  Result Date: 05/08/2022 CLINICAL DATA:  Headache. EXAM: CT HEAD WITHOUT CONTRAST TECHNIQUE: Contiguous axial images were obtained from the base of the skull through the vertex without intravenous contrast. RADIATION DOSE REDUCTION: This exam was performed according to the departmental dose-optimization program which includes automated exposure control, adjustment of the mA and/or kV according to patient size and/or use of iterative reconstruction technique. COMPARISON:  April 11, 2022 FINDINGS: Brain: There is mild cerebral atrophy with widening of the extra-axial spaces and ventricular dilatation. There are moderate severity areas of decreased attenuation within the white matter tracts of the supratentorial brain, consistent with microvascular disease changes. A small chronic left para thalamic infarct is noted. Vascular: No hyperdense vessel or unexpected calcification. Skull: Normal. Negative for fracture or focal lesion. Sinuses/Orbits: Chronic and postoperative changes are seen involving the left maxillary sinus. Other: None. IMPRESSION: 1. No acute intracranial abnormality. 2. Generalized cerebral atrophy with moderate severity microvascular disease changes of the supratentorial brain. 3. Small chronic left para thalamic infarct. 4. Chronic and postoperative changes involving the left maxillary sinus. Electronically Signed   By: Virgina Norfolk M.D.   On: 05/08/2022 18:53    Procedures Procedures    Medications Ordered in ED Medications  metoCLOPramide (REGLAN) injection 10 mg (10 mg Intravenous Given 05/08/22 1826)  diphenhydrAMINE  (BENADRYL) injection 50 mg (50 mg Intravenous Given 05/08/22 1824)    ED Course/ Medical Decision Making/ A&P                           Medical Decision Making Amount and/or Complexity of Data Reviewed Labs: ordered. Radiology: ordered.  Risk Prescription drug management.   BP (!) 150/102 (BP Location: Right Arm)   Pulse 93   Temp 99.4 F (37.4 C)   Resp (!) 24   SpO2 100%   94:110 PM 51 year old female significant history of schizoaffective disorder, malingering, frequent falls, brought here via EMS from home with complaints of headache.  Patient reports she woke up this morning with a headache.  Headache is described as a sinus headache primary to the front of face, intense, persistent, with associated light sensitivity, nausea, vomiting, and congestion.  No report of any fever or chills no runny nose sneezing coughing sore throat or ear  pain chest pain trouble breathing abdominal pain focal numbness or focal weakness or rash.  She normally does not have headaches.  She reported having a light stroke a month ago affecting the right side of her body.  She walks with a cane.  She is on aspirin but not on any blood thinner medication.  She tries taking Tylenol and allergy medication earlier today without relief.  On exam, patient appears uncomfortable but nontoxic in appearance.  She does not have any focal neurodeficit.  She does not have any nuchal rigidity concerning for meningitis.  No red flags.  Work initiated.  Will give migraine cocktail.  EMR reviewed, patient recently discharged on 10/10 with right-sided weakness and subsequently diagnosed with subcortical stroke on the left.  She also has known history of stage IV melanoma with metastasis to the brain and adrenal gland status post whole brain radiation, currently in remission.  Patient is currently on aspirin 81 mg only per neuro recommendation as patient has history of thrombocytopenia.  She also has an incidental ACA 2 mm  outpouching concerning for early aneurysm  9:53 PM  -Labs ordered, independently viewed and interpreted by me.  Labs remarkable for negative covid/flu/rsv, normal WBC, plt of 63 -The patient was maintained on a cardiac monitor.  I personally viewed and interpreted the cardiac monitored which showed an underlying rhythm of: NSR -Imaging independently viewed and interpreted by me and I agree with radiologist's interpretation.  Result remarkable for head CT scan without acute finding -This patient presents to the ED for concern of headache, this involves an extensive number of treatment options, and is a complaint that carries with it a high risk of complications and morbidity.  The differential diagnosis includes meningitis, SAH, stroke, space occupying lesion, migraine, tension headache, clustered headaches, ocular migraine, occipital headache -Co morbidities that complicate the patient evaluation includes prior stroke -Treatment includes migraine cocktail -Reevaluation of the patient after these medicines showed that the patient resolved -PCP office notes or outside notes reviewed -Escalation to admission/observation considered: patients feels much better, is comfortable with discharge, and will follow up with PCP -Prescription medication considered, patient comfortable with OTC medication -Social Determinant of Health considered which includes food insecurity, lack of transportation and housing         Final Clinical Impression(s) / ED Diagnoses Final diagnoses:  Bad headache    Rx / DC Orders ED Discharge Orders     None         Domenic Moras, PA-C 05/08/22 2156    Sherwood Gambler, MD 05/08/22 2324

## 2022-05-08 NOTE — ED Triage Notes (Addendum)
Per EMS. Pt has HA, told EMS she thinks it is a sinus headache. Sensitivity to light. Has not taken any OTC medications for this. Pt has nasal congestion in triage. Denies any vision changes or extremity weakness. Pain began this am.

## 2022-05-12 ENCOUNTER — Encounter: Payer: Self-pay | Admitting: Physician Assistant

## 2022-05-12 ENCOUNTER — Ambulatory Visit (INDEPENDENT_AMBULATORY_CARE_PROVIDER_SITE_OTHER): Payer: Medicare Other | Admitting: Physician Assistant

## 2022-05-12 VITALS — BP 122/88 | HR 80 | Ht 60.0 in | Wt 139.0 lb

## 2022-05-12 DIAGNOSIS — R142 Eructation: Secondary | ICD-10-CM | POA: Diagnosis not present

## 2022-05-12 DIAGNOSIS — K5909 Other constipation: Secondary | ICD-10-CM

## 2022-05-12 DIAGNOSIS — K219 Gastro-esophageal reflux disease without esophagitis: Secondary | ICD-10-CM | POA: Diagnosis not present

## 2022-05-12 MED ORDER — OMEPRAZOLE 20 MG PO CPDR: 20.0000 mg | DELAYED_RELEASE_CAPSULE | Freq: Every morning | ORAL | 11 refills | Status: DC

## 2022-05-12 MED ORDER — LINACLOTIDE 145 MCG PO CAPS: 145.0000 ug | ORAL_CAPSULE | Freq: Every morning | ORAL | 11 refills | Status: DC

## 2022-05-12 NOTE — Patient Instructions (Signed)
We have sent the following medications to your pharmacy for you to pick up at your convenience: Linzess 145 mcg and Omeprazole 20 mg  Buy Benadryl over the counter,  It was a pleasure to see you today!  Thank you for trusting me with your gastrointestinal care!

## 2022-05-12 NOTE — Progress Notes (Signed)
Reviewed.  Catrena L. Cleave Ternes, MD, MPH  

## 2022-05-12 NOTE — Progress Notes (Signed)
Chief Complaint: GERD and constipation  HPI:    Mrs. Rambo is a 51 year old female with a past medical history as listed below including GERD, known to Dr. Tarri Glenn, who was referred to me by [name], NP-C for a complaint of GERD and constipation.    03/19/2020 colonoscopy normal other than internal and external hemorrhoids as well as a tortuous colon.  Repeat recommended 10 years    06/26/2021-07/15/2021 patient admitted to behavioral health hospital for bizarre behaviors including barricading herself in the bathroom at her extended stay hotel and hyperreligiosity.  She was noted to have schizophrenia with psychotic disorder type.  She was started on various medications.    06/30/2021 CT of the chest abdomen pelvis with contrast done for history of metastatic melanoma with bilateral adrenal nodules decreased in size, soft tissue lesion of the gallbladder increased in size when compared with prior exams, unchanged indeterminate hyperenhancing lesions of the caudate lobe of the liver and spleen, endometrial thickening which was increased.    07/08/2021 CBC with platelets decreased at 68.  BMP normal.    07/24/2021 patient seen in clinic by me for indigestion and burping with epigastric discomfort and constipation.  At that time discussed a lot of gas and burping as well as flatulence.  She was eating a lot of microwave meals.  At that time recommend the patient start Omeprazole 20 mg every morning and continue Gas-X before meals as well as MiraLAX for constipation.  Scheduled her for an EGD.    08/11/2021 EGD with a small hiatal hernia and erythematous duodenopathy and otherwise normal.  Biopsies were normal.    02/02/2022 CBC with a decreased platelet count at 67, CMP with a minimally elevated glucose of 101 and otherwise normal.    02/04/2022 patient seen in clinic and continues to describe burping and "tubing", and constipation with bloating.  She blames this on junk food and told me she cannot eat  enough vegetables because her fridge froze things.  She has started Colace daily to help but continues to struggle to have a daily bowel movement.  She never tried MiraLAX.  At that time discussed how her symptoms are related to diet and recommended a fiber supplement.  Also recommended starting MiraLAX on a daily basis.    Today, the patient presents to clinic, she is a poor historian.  Explains that she was seen in the ED for terrible headache and they were supposed to give her Benadryl and she never got one.  Asking for prescription today.      Also describes a lot of upper GI issues with burping hiccuping and sometimes regurgitation and some mild epigastric pain.  She never tried Omeprazole as discussed at last visit.  She does tell me Gas-X helps some.    Describes that her constipation is moderately controlled with Linzess 72 mcg daily.  She would like to try higher dose to see if this is helpful.    Denies fever, chills, weight loss, blood in her stool or symptoms that awaken her from sleep.  Past Medical History:  Diagnosis Date   Allergy    Anemia    Gallstones    GERD (gastroesophageal reflux disease)    Hemorrhoids    melanoma with met dz dx'd 01/2018   brain and adrenal   Seasonal allergies    Stroke [facility])     Past Surgical History:  Procedure Laterality Date   BIOPSY  01/28/2018   Procedure: BIOPSY;  Surgeon: Laurence Spates,  MD;  Location: [facility];  Service: Endoscopy;;   BREAST BIOPSY Left    ESOPHAGOGASTRODUODENOSCOPY N/A 01/28/2018   Procedure: ESOPHAGOGASTRODUODENOSCOPY (EGD);  Surgeon: Laurence Spates, MD;  Location: [facility];  Service: Endoscopy;  Laterality: N/A;   MOUTH SURGERY     OVARY SURGERY     removed "something"    Current Outpatient Medications  Medication Sig Dispense Refill   aspirin EC 81 MG tablet Take 1 tablet (81 mg total) by mouth daily. Swallow whole. 30 tablet 12   linaclotide (LINZESS) 72 MCG capsule Take 2 capsules (144 mcg total) by  mouth daily before breakfast. 60 capsule 1   loratadine (CLARITIN) 10 MG tablet Take 1 tablet (10 mg total) by mouth daily. 30 tablet 0   milk thistle 175 MG tablet Take 175 mg by mouth daily.     Multiple Vitamins-Minerals (HAIR SKIN AND NAILS FORMULA) TABS Take 2 tablets by mouth daily.     Multiple Vitamins-Minerals (MULTIVITAMIN GUMMIES ADULT PO) Take 1 tablet by mouth.     rosuvastatin (CRESTOR) 40 MG tablet Take 1 tablet (40 mg total) by mouth daily. 30 tablet 3   Simethicone (GAS RELIEF 125 MAX ST PO) Take 1 tablet by mouth as needed.     Turmeric Curcumin 500 MG CAPS Take 1 capsule by mouth daily in the afternoon.     No current facility-administered medications for this visit.    Allergies as of 05/12/2022   (No Known Allergies)    Family History  Problem Relation Age of Onset   Melanoma Mother    Hypertension Father    Breast cancer Maternal Aunt    Breast cancer Paternal Aunt    Diabetes Paternal Aunt    Breast cancer Maternal Grandmother    Breast cancer Paternal Grandmother    Diabetes Paternal Grandmother    Colon cancer Neg Hx    Esophageal cancer Neg Hx    Pancreatic cancer Neg Hx    Stomach cancer Neg Hx    Liver disease Neg Hx    Rectal cancer Neg Hx     Social History   Socioeconomic History   Marital status: Single    Spouse name: Not on file   Number of children: Not on file   Years of education: Not on file   Highest education level: Not on file  Occupational History   Not on file  Tobacco Use   Smoking status: Never   Smokeless tobacco: Never  Vaping Use   Vaping Use: Never used  Substance and Sexual Activity   Alcohol use: No   Drug use: No   Sexual activity: Not Currently  Other Topics Concern   Not on file  Social History Narrative   Not on file   Social Determinants of Health   Financial Resource Strain: Not on file  Food Insecurity: Food Insecurity Present (04/09/2022)   Hunger Vital Sign    Worried About Running Out of Food in  the Last Year: Sometimes true    Ran Out of Food in the Last Year: Sometimes true  Transportation Needs: Unmet Transportation Needs (04/09/2022)   PRAPARE - Hydrologist (Medical): Yes    Lack of Transportation (Non-Medical): Yes  Physical Activity: Not on file  Stress: Not on file  Social Connections: Not on file  Intimate Partner Violence: Not At Risk (04/09/2022)   Humiliation, Afraid, Rape, and Kick questionnaire    Fear of Current or Ex-Partner: No    Emotionally Abused:  No    Physically Abused: No    Sexually Abused: No    Review of Systems:    Constitutional: No weight loss, fever or chills Cardiovascular: No chest pain Respiratory: No SOB Gastrointestinal: See HPI and otherwise negative   Physical Exam:  Vital signs: BP 122/88   Pulse 80   Ht 5' (1.524 m)   Wt 139 lb (63 kg)   SpO2 98%   BMI 27.15 kg/m    Constitutional:   Pleasant Caucasian female appears to be in NAD, Well developed, Well nourished, alert and cooperative Respiratory: Respirations even and unlabored. Lungs clear to auscultation bilaterally.   No wheezes, crackles, or rhonchi.  Cardiovascular: Normal S1, S2. No MRG. Regular rate and rhythm. No peripheral edema, cyanosis or pallor.  Gastrointestinal:  Soft, nondistended, nontender. No rebound or guarding. Normal bowel sounds. No appreciable masses or hepatomegaly. Rectal:  Not performed.  Psychiatric: Demonstrates good judgement and reason without abnormal affect or behaviors. +decreased memory  RELEVANT LABS AND IMAGING: CBC    Component Value Date/Time   WBC 5.5 05/08/2022 1822   RBC 4.96 05/08/2022 1822   HGB 13.5 05/08/2022 1822   HGB 13.1 02/02/2022 1207   HCT 40.4 05/08/2022 1822   PLT 63 (L) 05/08/2022 1822   PLT 67 (L) 02/02/2022 1207   MCV 81.5 05/08/2022 1822   MCH 27.2 05/08/2022 1822   MCHC 33.4 05/08/2022 1822   RDW 12.4 05/08/2022 1822   LYMPHSABS 1.4 05/08/2022 1822   MONOABS 0.5 05/08/2022 1822    EOSABS 0.0 05/08/2022 1822   BASOSABS 0.0 05/08/2022 1822    CMP     Component Value Date/Time   NA 135 05/08/2022 1822   K 3.4 (L) 05/08/2022 1822   CL 105 05/08/2022 1822   CO2 22 05/08/2022 1822   GLUCOSE 102 (H) 05/08/2022 1822   BUN 18 05/08/2022 1822   CREATININE 0.49 05/08/2022 1822   CREATININE 0.66 02/02/2022 1207   CALCIUM 9.0 05/08/2022 1822   PROT 6.7 04/09/2022 0434   ALBUMIN 3.7 04/09/2022 0434   AST 26 04/09/2022 0434   AST 24 02/02/2022 1207   ALT 21 04/09/2022 0434   ALT 17 02/02/2022 1207   ALKPHOS 90 04/09/2022 0434   BILITOT 0.6 04/09/2022 0434   BILITOT 0.4 02/02/2022 1207   GFRNONAA >60 05/08/2022 1822   GFRNONAA >60 02/02/2022 1207   GFRAA >60 01/08/2020 1210    Assessment: 1.  GERD: With eructations and hiccups; likely mild gastritis 2.  Chronic constipation: Better with Linzess 72 mcg but still has some issues  Plan: 1.  Increased Linzess to 145 mcg daily.  Sent in a prescription #30 with 11 refills and also gave her a box of samples today. 2.  Prescribed Omeprazole 20 mg every morning, 30-60 minutes for breakfast.  #30 with 11 refills.  This was sent to [facility] per patient request. 3.  Discussed with patient that she can buy Benadryl over-the-counter at [facility]. 4.  Patient to follow in clinic with [place] in January or sooner if needed.  She was put in recall for an appointment then.  Ellouise Newer, PA-C [facility] 05/12/2022, 10:29 AM  Cc: Henson, Vickie L, NP-C

## 2022-05-18 DIAGNOSIS — Z0100 Encounter for examination of eyes and vision without abnormal findings: Secondary | ICD-10-CM | POA: Diagnosis not present

## 2022-05-19 ENCOUNTER — Telehealth: Payer: Self-pay | Admitting: Physician Assistant

## 2022-05-19 NOTE — Progress Notes (Unsigned)
[facility] [address] [place]. [place] [address] [phone]       [place] Date of Birth:  06/10/1971 Medical Record Number:  [id number]   Reason for Referral:  hospital stroke follow up    SUBJECTIVE:   CHIEF COMPLAINT:  No chief complaint on file.   HPI:   Kaitlyn Duncan is a 51 year old female with history of melanoma with brain metastasis status post chemo and whole brain radiation, anemia, thrombocytopenia who presented on 04/08/2022 with right-sided weakness.  Evaluated by Dr. Erlinda Hong.  CT showed possible acute stroke left internal capsule.  MRI confirmed left posterior limb internal capsule infarct, stable treated brain metastasis lesion.  CT head and neck unremarkable.  EF 55 to 60%, A1c 5.3, LDL 152.  Etiology likely due to whole brain radiation treatment vs small vessel disease.  Initiated aspirin 81 mg daily, no DAPT given thrombocytopenia as well as initiated Crestor 40 mg daily.  Therapies recommended home health PT/OT and discharged home.       PERTINENT IMAGING  Per hospitalization 04/09/2022 - *** CT head Acute left thalamic capsular infarct, better seen on prior brain MRI. No associated hemorrhage or mass effect. CTA head & neck unremarkable MRI  1.3 cm acute ischemic nonhemorrhagic left thalamocapsular infarct. 2D Echo EF 55-60% LDL 152 HgbA1c 5.3    ROS:   14 system review of systems performed and negative with exception of ***  PMH:  Past Medical History:  Diagnosis Date   Allergy    Anemia    Gallstones    GERD (gastroesophageal reflux disease)    Hemorrhoids    melanoma with met dz dx'd 01/2018   brain and adrenal   Seasonal allergies    Stroke [facility])     PSH:  Past Surgical History:  Procedure Laterality Date   BIOPSY  01/28/2018   Procedure: BIOPSY;  Surgeon: Laurence Spates, MD;  Location: [facility];  Service: Endoscopy;;   BREAST BIOPSY Left     ESOPHAGOGASTRODUODENOSCOPY N/A 01/28/2018   Procedure: ESOPHAGOGASTRODUODENOSCOPY (EGD);  Surgeon: Laurence Spates, MD;  Location: [facility];  Service: Endoscopy;  Laterality: N/A;   MOUTH SURGERY     OVARY SURGERY     removed "something"    Social History:  Social History   Socioeconomic History   Marital status: Single    Spouse name: Not on file   Number of children: Not on file   Years of education: Not on file   Highest education level: Not on file  Occupational History   Not on file  Tobacco Use   Smoking status: Never   Smokeless tobacco: Never  Vaping Use   Vaping Use: Never used  Substance and Sexual Activity   Alcohol use: No   Drug use: No   Sexual activity: Not Currently  Other Topics Concern   Not on file  Social History Narrative   Not on file   Social Determinants of Health   Financial Resource Strain: Not on file  Food Insecurity: Food Insecurity Present (04/09/2022)   Hunger Vital Sign    Worried About Running Out of Food in the Last Year: Sometimes true    Ran Out of Food in the Last Year: Sometimes true  Transportation Needs: Unmet Transportation Needs (04/09/2022)   PRAPARE - Hydrologist (Medical): Yes    Lack of Transportation (Non-Medical): Yes  Physical Activity: Not on file  Stress: Not on file  Social Connections: Not on file  Intimate Partner Violence: Not At Risk (04/09/2022)   Humiliation, Afraid, Rape, and Kick questionnaire    Fear of Current or Ex-Partner: No    Emotionally Abused: No    Physically Abused: No    Sexually Abused: No    Family History:  Family History  Problem Relation Age of Onset   Melanoma Mother    Hypertension Father    Breast cancer Maternal Aunt    Breast cancer Paternal Aunt    Diabetes Paternal Aunt    Breast cancer Maternal Grandmother    Breast cancer Paternal Grandmother    Diabetes Paternal Grandmother    Colon cancer Neg Hx    Esophageal cancer Neg Hx    Pancreatic  cancer Neg Hx    Stomach cancer Neg Hx    Liver disease Neg Hx    Rectal cancer Neg Hx     Medications:   Current Outpatient Medications on File Prior to Visit  Medication Sig Dispense Refill   aspirin EC 81 MG tablet Take 1 tablet (81 mg total) by mouth daily. Swallow whole. 30 tablet 12   linaclotide (LINZESS) 145 MCG CAPS capsule Take 1 capsule (145 mcg total) by mouth in the morning. 30 capsule 11   linaclotide (LINZESS) 72 MCG capsule Take 2 capsules (144 mcg total) by mouth daily before breakfast. 60 capsule 1   loratadine (CLARITIN) 10 MG tablet Take 1 tablet (10 mg total) by mouth daily. 30 tablet 0   milk thistle 175 MG tablet Take 175 mg by mouth daily.     Multiple Vitamins-Minerals (HAIR SKIN AND NAILS FORMULA) TABS Take 2 tablets by mouth daily.     Multiple Vitamins-Minerals (MULTIVITAMIN GUMMIES ADULT PO) Take 1 tablet by mouth.     omeprazole (PRILOSEC) 20 MG capsule Take 1 capsule (20 mg total) by mouth in the morning. 30 capsule 11   rosuvastatin (CRESTOR) 40 MG tablet Take 1 tablet (40 mg total) by mouth daily. 30 tablet 3   Simethicone (GAS RELIEF 125 MAX ST PO) Take 1 tablet by mouth as needed.     Turmeric Curcumin 500 MG CAPS Take 1 capsule by mouth daily in the afternoon.     No current facility-administered medications on file prior to visit.    Allergies:  No Known Allergies    OBJECTIVE:  Physical Exam  There were no vitals filed for this visit. There is no height or weight on file to calculate BMI. No results found.     12/24/2021    1:33 PM  Depression screen PHQ 2/9  Decreased Interest 0  Down, Depressed, Hopeless 0  PHQ - 2 Score 0     General: well developed, well nourished, seated, in no evident distress Head: head normocephalic and atraumatic.   Neck: supple with no carotid or supraclavicular bruits Cardiovascular: regular rate and rhythm, no murmurs Musculoskeletal: no deformity Skin:  no rash/petichiae Vascular:  Normal pulses all  extremities   Neurologic Exam Mental Status: Awake and fully alert. Oriented to place and time. Recent and remote memory intact. Attention span, concentration and fund of knowledge appropriate. Mood and affect appropriate.  Cranial Nerves: Fundoscopic exam reveals sharp disc margins. Pupils equal, briskly reactive to light. Extraocular movements full without nystagmus. Visual fields full to confrontation. Hearing intact. Facial sensation intact. Face, tongue, palate moves normally and symmetrically.  Motor: Normal bulk and tone. Normal strength in all tested extremity muscles Sensory.: intact to touch , pinprick , position  and vibratory sensation.  Coordination: Rapid alternating movements normal in all extremities. Finger-to-nose and heel-to-shin performed accurately bilaterally. Gait and Station: Arises from chair without difficulty. Stance is normal. Gait demonstrates normal stride length and balance with ***. Tandem walk and heel toe ***.  Reflexes: 1+ and symmetric. Toes downgoing.     NIHSS  *** Modified Rankin  ***      ASSESSMENT: Kaitlyn Duncan is a 51 y.o. year old female with acute left PLIC infarct on 92/03/5746 likely due to whole brain radiation treatment vs small vessel disease. Vascular risk factors include HLD, history of melanoma with brain metastasis in remission (dx'd 2019) and chronic thrombocytopenia.      PLAN:  L PLIC stroke :  Residual deficit: ***.  Continue aspirin 45m daily and rosuvastatin (Crestor) for secondary stroke prevention.   Discussed secondary stroke prevention measures and importance of close PCP follow up for aggressive stroke risk factor management including BP goal<130/90, HLD with LDL goal<70 and DM with A1c.<7 .  Stroke labs 04/2022: LDL 152, A1c 5.3 I have gone over the pathophysiology of stroke, warning signs and symptoms, risk factors and their management in some detail with instructions to go to the closest emergency room for  symptoms of concern.     Follow up in *** or call earlier if needed   CC:  [facility] provider: Dr. SLeonie ManPCP: HGirtha Rm NP-C    I spent *** minutes of face-to-face and non-face-to-face time with patient.  This included previsit chart review including review of recent hospitalization, lab review, study review, order entry, electronic health record documentation, patient education regarding recent stroke including etiology, secondary stroke prevention measures and importance of managing stroke risk factors, residual deficits and typical recovery time and answered all other questions to patient satisfaction   JFrann Rider AGNP-BC  [facility] [address] [id number] Phone [phone] [phone]: This document was prepared with digital dictation and possible smart phrase technology. Any transcriptional errors that result from this process are unintentional.

## 2022-05-20 ENCOUNTER — Encounter: Payer: Self-pay | Admitting: Adult Health

## 2022-05-20 ENCOUNTER — Ambulatory Visit (INDEPENDENT_AMBULATORY_CARE_PROVIDER_SITE_OTHER): Payer: Medicare Other | Admitting: Adult Health

## 2022-05-20 VITALS — BP 126/85 | HR 88 | Ht 60.0 in | Wt 140.2 lb

## 2022-05-20 DIAGNOSIS — Z09 Encounter for follow-up examination after completed treatment for conditions other than malignant neoplasm: Secondary | ICD-10-CM | POA: Diagnosis not present

## 2022-05-20 DIAGNOSIS — I69351 Hemiplegia and hemiparesis following cerebral infarction affecting right dominant side: Secondary | ICD-10-CM | POA: Diagnosis not present

## 2022-05-20 DIAGNOSIS — G441 Vascular headache, not elsewhere classified: Secondary | ICD-10-CM

## 2022-05-20 DIAGNOSIS — I639 Cerebral infarction, unspecified: Secondary | ICD-10-CM

## 2022-05-20 NOTE — Patient Instructions (Signed)
Continue to do exercises at home, if you would like to do additional therapy please let me know  Your headaches should continue to get better. If they persist over the next month, please let me or your primary doctor know for further assistance. Okay to take tylenol as needed for headaches, please do not take more than a couple times per week as taking too much tylenol can actual end up making your headaches worse  Continue aspirin 81 mg daily  and Crestor  for secondary stroke prevention  Continue to follow up with PCP regarding cholesterol and blood pressure management  Maintain strict control of hypertension with blood pressure goal below 130/90 and cholesterol with LDL cholesterol (bad cholesterol) goal below 70 mg/dL.   Signs of a Stroke? Follow the BEFAST method:  Balance Watch for a sudden loss of balance, trouble with coordination or vertigo Eyes Is there a sudden loss of vision in one or both eyes? Or double vision?  Face: Ask the person to smile. Does one side of the face droop or is it numb?  Arms: Ask the person to raise both arms. Does one arm drift downward? Is there weakness or numbness of a leg? Speech: Ask the person to repeat a simple phrase. Does the speech sound slurred/strange? Is the person confused ? Time: If you observe any of these signs, call 911.       Thank you for coming to see [place] at [facility]. I hope we have been able to provide you high quality care today.  You may receive a patient satisfaction survey over the next few weeks. We would appreciate your feedback and comments so that we may continue to improve ourselves and the health of our patients.   Stroke Prevention Some medical conditions and behaviors can lead to a higher chance of having a stroke. You can help prevent a stroke by eating healthy, exercising, not smoking, and managing any medical conditions you have. Stroke is a leading cause of functional impairment. Primary  prevention is particularly important because a majority of strokes are first-time events. Stroke changes the lives of not only those who experience a stroke but also their family and other caregivers. How can this condition affect me? A stroke is a medical emergency and should be treated right away. A stroke can lead to brain damage and can sometimes be life-threatening. If a person gets medical treatment right away, there is a better chance of surviving and recovering from a stroke. What can increase my risk? The following medical conditions may increase your risk of a stroke: Cardiovascular disease. High blood pressure (hypertension). Diabetes. High cholesterol. Sickle cell disease. Blood clotting disorders (hypercoagulable state). Obesity. Sleep disorders (obstructive sleep apnea). Other risk factors include: Being older than age 63. Having a history of blood clots, stroke, or mini-stroke (transient ischemic attack, TIA). Genetic factors, such as race, ethnicity, or a family history of stroke. Smoking cigarettes or using other tobacco products. Taking birth control pills, especially if you also use tobacco. Heavy use of alcohol or drugs, especially cocaine and methamphetamine. Physical inactivity. What actions can I take to prevent this? Manage your health conditions High cholesterol levels. Eating a healthy diet is important for preventing high cholesterol. If cholesterol cannot be managed through diet alone, you may need to take medicines. Take any prescribed medicines to control your cholesterol as told by your health care provider. Hypertension. To reduce your risk of stroke, try to keep your blood pressure below 130/80. Eating  a healthy diet and exercising regularly are important for controlling blood pressure. If these steps are not enough to manage your blood pressure, you may need to take medicines. Take any prescribed medicines to control hypertension as told by your health  care provider. Ask your health care provider if you should monitor your blood pressure at home. Have your blood pressure checked every year, even if your blood pressure is normal. Blood pressure increases with age and some medical conditions. Diabetes. Eating a healthy diet and exercising regularly are important parts of managing your blood sugar (glucose). If your blood sugar cannot be managed through diet and exercise, you may need to take medicines. Take any prescribed medicines to control your diabetes as told by your health care provider. Get evaluated for obstructive sleep apnea. Talk to your health care provider about getting a sleep evaluation if you snore a lot or have excessive sleepiness. Make sure that any other medical conditions you have, such as atrial fibrillation or atherosclerosis, are managed. Nutrition Follow instructions from your health care provider about what to eat or drink to help manage your health condition. These instructions may include: Reducing your daily calorie intake. Limiting how much salt (sodium) you use to 1,500 milligrams (mg) each day. Using only healthy fats for cooking, such as olive oil, canola oil, or sunflower oil. Eating healthy foods. You can do this by: Choosing foods that are high in fiber, such as whole grains, and fresh fruits and vegetables. Eating at least 5 servings of fruits and vegetables a day. Try to fill one-half of your plate with fruits and vegetables at each meal. Choosing lean protein foods, such as lean cuts of meat, poultry without skin, fish, tofu, beans, and nuts. Eating low-fat dairy products. Avoiding foods that are high in sodium. This can help lower blood pressure. Avoiding foods that have saturated fat, trans fat, and cholesterol. This can help prevent high cholesterol. Avoiding processed and prepared foods. Counting your daily carbohydrate intake.  Lifestyle If you drink alcohol: Limit how much you have to: 0-1 drink  a day for women who are not pregnant. 0-2 drinks a day for men. Know how much alcohol is in your drink. In the [place] one drink equals one 12 oz bottle of beer (334m), one 5 oz glass of wine (1473m, or one 1 oz glass of hard liquor (449m Do not use any products that contain nicotine or tobacco. These products include cigarettes, chewing tobacco, and vaping devices, such as e-cigarettes. If you need help quitting, ask your health care provider. Avoid secondhand smoke. Do not use drugs. Activity  Try to stay at a healthy weight. Get at least 30 minutes of exercise on most days, such as: Fast walking. Biking. Swimming. Medicines Take over-the-counter and prescription medicines only as told by your health care provider. Aspirin or blood thinners (antiplatelets or anticoagulants) may be recommended to reduce your risk of forming blood clots that can lead to stroke. Avoid taking birth control pills. Talk to your health care provider about the risks of taking birth control pills if: You are over 35 40ars old. You smoke. You get very bad headaches. You have had a blood clot. Where to find more information American Stroke Association: www.strokeassociation.org Get help right away if: You or a loved one has any symptoms of a stroke. "BE FAST" is an easy way to remember the main warning signs of a stroke: B - Balance. Signs are dizziness, sudden trouble walking, or loss of balance. E -  Eyes. Signs are trouble seeing or a sudden change in vision. F - Face. Signs are sudden weakness or numbness of the face, or the face or eyelid drooping on one side. A - Arms. Signs are weakness or numbness in an arm. This happens suddenly and usually on one side of the body. S - Speech. Signs are sudden trouble speaking, slurred speech, or trouble understanding what people say. T - Time. Time to call emergency services. Write down what time symptoms started. You or a loved one has other signs of a stroke, such  as: A sudden, severe headache with no known cause. Nausea or vomiting. Seizure. These symptoms may represent a serious problem that is an emergency. Do not wait to see if the symptoms will go away. Get medical help right away. Call your local emergency services (911 in the [place] Do not drive yourself to the hospital. Summary You can help to prevent a stroke by eating healthy, exercising, not smoking, limiting alcohol intake, and managing any medical conditions you may have. Do not use any products that contain nicotine or tobacco. These include cigarettes, chewing tobacco, and vaping devices, such as e-cigarettes. If you need help quitting, ask your health care provider. Remember "BE FAST" for warning signs of a stroke. Get help right away if you or a loved one has any of these signs. This information is not intended to replace advice given to you by your health care provider. Make sure you discuss any questions you have with your health care provider. Document Revised: 01/21/2020 Document Reviewed: 01/21/2020 Elsevier Patient Education  [employer].

## 2022-05-20 NOTE — Progress Notes (Signed)
I agree with the above plan 

## 2022-05-25 ENCOUNTER — Emergency Department (HOSPITAL_COMMUNITY)
Admission: EM | Admit: 2022-05-25 | Discharge: 2022-05-25 | Disposition: A | Discharge status: Home / Self Care | Payer: Medicare Other | Attending: Emergency Medicine | Admitting: Emergency Medicine

## 2022-05-25 ENCOUNTER — Encounter (HOSPITAL_COMMUNITY): Payer: Self-pay

## 2022-05-25 ENCOUNTER — Other Ambulatory Visit: Payer: Self-pay

## 2022-05-25 ENCOUNTER — Emergency Department (HOSPITAL_COMMUNITY): Payer: Medicare Other

## 2022-05-25 DIAGNOSIS — M25522 Pain in left elbow: Secondary | ICD-10-CM | POA: Insufficient documentation

## 2022-05-25 DIAGNOSIS — Z7982 Long term (current) use of aspirin: Secondary | ICD-10-CM | POA: Diagnosis not present

## 2022-05-25 DIAGNOSIS — M25571 Pain in right ankle and joints of right foot: Secondary | ICD-10-CM | POA: Diagnosis not present

## 2022-05-25 DIAGNOSIS — M25521 Pain in right elbow: Secondary | ICD-10-CM | POA: Insufficient documentation

## 2022-05-25 DIAGNOSIS — M25551 Pain in right hip: Secondary | ICD-10-CM | POA: Diagnosis not present

## 2022-05-25 DIAGNOSIS — M25561 Pain in right knee: Secondary | ICD-10-CM | POA: Diagnosis not present

## 2022-05-25 DIAGNOSIS — W1830XA Fall on same level, unspecified, initial encounter: Secondary | ICD-10-CM | POA: Insufficient documentation

## 2022-05-25 DIAGNOSIS — T07XXXA Unspecified multiple injuries, initial encounter: Secondary | ICD-10-CM

## 2022-05-25 MED ORDER — MELOXICAM 15 MG PO TABS: 15.0000 mg | ORAL_TABLET | Freq: Every day | ORAL | 0 refills | Status: DC

## 2022-05-25 NOTE — ED Triage Notes (Signed)
Pt BIB EMS from [facility]. Pt had a fall yesterday and is complaining of right hip pain. Pt is ambulatory.

## 2022-05-25 NOTE — ED Provider Notes (Signed)
[facility] DEPT Provider Note   CSN: [id number] Arrival date & time: 05/25/22  0544     History  Chief Complaint  Patient presents with   Fall   Hip Pain    Kaitlyn Duncan is a 51 y.o. female.  Patient presents to the emergency department for evaluation of injuries from a fall.  Patient reports that she fell yesterday.  She was trying to back out of a doorway, lost her balance and fell backwards.  She is complaining of pain in both of her elbows, her right hip, right knee, right ankle.  No head injury or loss of consciousness.       Home Medications Prior to Admission medications   Medication Sig Start Date End Date Taking? Authorizing Provider  meloxicam (MOBIC) 15 MG tablet Take 1 tablet (15 mg total) by mouth daily. 05/25/22  Yes Malley Hauter, Gwenyth Allegra, MD  aspirin EC 81 MG tablet Take 1 tablet (81 mg total) by mouth daily. Swallow whole. 04/13/22   Little Ishikawa, MD  linaclotide [facility]) 145 MCG CAPS capsule Take 1 capsule (145 mcg total) by mouth in the morning. 05/12/22 05/07/23  Levin Erp, PA  linaclotide [facility]) 72 MCG capsule Take 2 capsules (144 mcg total) by mouth daily before breakfast. 04/13/22   Little Ishikawa, MD  loratadine (CLARITIN) 10 MG tablet Take 1 tablet (10 mg total) by mouth daily. Patient not taking: Reported on 05/20/2022 04/12/22   Little Ishikawa, MD  milk thistle 175 MG tablet Take 175 mg by mouth daily.    [provider]  Multiple Vitamins-Minerals (HAIR SKIN AND NAILS FORMULA) TABS Take 2 tablets by mouth daily.    [provider]  Multiple Vitamins-Minerals (MULTIVITAMIN GUMMIES ADULT PO) Take 1 tablet by mouth.    [provider]  omeprazole (PRILOSEC) 20 MG capsule Take 1 capsule (20 mg total) by mouth in the morning. 05/12/22   Levin Erp, PA  rosuvastatin (CRESTOR) 40 MG tablet Take 1 tablet (40 mg total) by mouth daily. Patient not  taking: Reported on 05/20/2022 04/13/22   Little Ishikawa, MD  Simethicone (GAS RELIEF 125 MAX ST PO) Take 1 tablet by mouth as needed.    [provider]  Turmeric Curcumin 500 MG CAPS Take 1 capsule by mouth daily in the afternoon.    [provider]      Allergies    Patient has no known allergies.    Review of Systems   Review of Systems  Physical Exam Updated Vital Signs BP (!) 150/100 (BP Location: Right Arm)   Pulse 64   Temp 98 F (36.7 C) (Oral)   Resp 16   SpO2 100%  Physical Exam Vitals and nursing note reviewed.  Constitutional:      General: She is not in acute distress.    Appearance: She is well-developed.  HENT:     Head: Normocephalic and atraumatic.     Mouth/Throat:     Mouth: Mucous membranes are moist.  Eyes:     General: Vision grossly intact. Gaze aligned appropriately.     Extraocular Movements: Extraocular movements intact.     Conjunctiva/sclera: Conjunctivae normal.  Cardiovascular:     Rate and Rhythm: Normal rate and regular rhythm.     Pulses: Normal pulses.     Heart sounds: Normal heart sounds, S1 normal and S2 normal. No murmur heard.    No friction rub. No gallop.  Pulmonary:  Effort: Pulmonary effort is normal. No respiratory distress.     Breath sounds: Normal breath sounds.  Abdominal:     General: Bowel sounds are normal.     Palpations: Abdomen is soft.     Tenderness: There is no abdominal tenderness. There is no guarding or rebound.     Hernia: No hernia is present.  Musculoskeletal:        General: No swelling.     Right elbow: No swelling, deformity, effusion or lacerations. Normal range of motion. Tenderness present in olecranon process.     Left elbow: No swelling, deformity, effusion or lacerations. Normal range of motion. Tenderness present in olecranon process.     Cervical back: Full passive range of motion without pain, normal range of motion and neck supple. No spinous process tenderness or  muscular tenderness. Normal range of motion.     Right hip: Tenderness present. No deformity. Normal range of motion.     Left hip: Normal.     Right knee: No swelling, deformity, effusion, erythema, ecchymosis or lacerations. Normal range of motion. Tenderness present.     Left knee: Normal.     Right lower leg: No edema.     Left lower leg: No edema.     Right ankle: No swelling, deformity or ecchymosis. Tenderness present. Normal range of motion.  Skin:    General: Skin is warm and dry.     Capillary Refill: Capillary refill takes less than 2 seconds.     Findings: No ecchymosis, erythema, rash or wound.  Neurological:     General: No focal deficit present.     Mental Status: She is alert and oriented to person, place, and time.     GCS: GCS eye subscore is 4. GCS verbal subscore is 5. GCS motor subscore is 6.     Cranial Nerves: Cranial nerves 2-12 are intact.     Sensory: Sensation is intact.     Motor: Motor function is intact.     Coordination: Coordination is intact.  Psychiatric:        Attention and Perception: Attention normal.        Mood and Affect: Mood normal.        Speech: Speech normal.        Behavior: Behavior normal.     ED Results / Procedures / Treatments   Labs (all labs ordered are listed, but only abnormal results are displayed) Labs Reviewed - No data to display  EKG None  Radiology DG Ankle Complete Right  Result Date: 05/25/2022 CLINICAL DATA:  The status post fall. EXAM: RIGHT ANKLE - COMPLETE 3+ VIEW COMPARISON:  None Available. FINDINGS: The soft tissues are unremarkable. No sign of acute fracture or dislocation. No significant arthropathy. IMPRESSION: Negative. Electronically Signed   By: Kerby Moors M.D.   On: 05/25/2022 07:17   DG Knee Complete 4 Views Right  Result Date: 05/25/2022 CLINICAL DATA:  Status post fall. EXAM: RIGHT KNEE - COMPLETE 4+ VIEW COMPARISON:  None Available. FINDINGS: No sign of acute fracture or dislocation. No  joint effusion. No significant arthropathy. Mild edema of the prepatellar tendon soft tissues. IMPRESSION: 1. No acute bone abnormality. 2. Mild edema of the prepatellar tendon soft tissues. Electronically Signed   By: Kerby Moors M.D.   On: 05/25/2022 07:15   DG Hip Unilat W or Wo Pelvis 2-3 Views Right  Result Date: 05/25/2022 CLINICAL DATA:  Status post fall.  Right leg pain. EXAM: DG HIP (WITH OR  WITHOUT PELVIS) 2-3V RIGHT COMPARISON:  CT 02/10/2022 FINDINGS: No sign of acute fracture or dislocation. No significant arthropathy identified. Densely sclerotic lesion within the right sacral wing measuring 1.7 cm is unchanged from previous CT and likely represents a benign bone island. IMPRESSION: 1. No acute findings. 2. Stable benign bone island within the right sacral wing. Electronically Signed   By: Kerby Moors M.D.   On: 05/25/2022 07:13   DG Elbow Complete Right  Result Date: 05/25/2022 CLINICAL DATA:  Fall.  Elbow pain. EXAM: RIGHT ELBOW - COMPLETE 3+ VIEW COMPARISON:  None Available. FINDINGS: There is no joint effusion. No sign of acute fracture or dislocation. No significant arthropathy no radiopaque foreign bodies or soft tissue calcifications. IMPRESSION: Negative. Electronically Signed   By: Kerby Moors M.D.   On: 05/25/2022 07:12   DG Elbow Complete Left  Result Date: 05/25/2022 CLINICAL DATA:  51 year old female status post fall with pain. EXAM: LEFT ELBOW - COMPLETE 3+ VIEW COMPARISON:  None Available. FINDINGS: Bone mineralization is within normal limits. There is no evidence of fracture, dislocation, or joint effusion. Alignment and joint spaces within normal limits for age. Mild dorsal soft tissue swelling at the distal humerus and forearm. No soft tissue gas. IMPRESSION: Mild soft tissue swelling. No acute fracture or dislocation identified about the left elbow. Electronically Signed   By: Genevie Ann M.D.   On: 05/25/2022 07:11    Procedures Procedures    Medications  Ordered in ED Medications - No data to display  ED Course/ Medical Decision Making/ A&P                           Medical Decision Making Amount and/or Complexity of Data Reviewed Radiology: ordered.   Presents to the emergency department with complaints of multiple joint injuries after a fall that occurred yesterday.  Patient is ambulatory.  Patient complaining of bilateral elbow pain, right hip pain, right knee pain, right ankle pain.  X-rays obtained and are negative.  No evidence of head injury, no spinal injury.  Patient reassured, no further work-up, appropriate for discharge.        Final Clinical Impression(s) / ED Diagnoses Final diagnoses:  Multiple contusions    Rx / DC Orders ED Discharge Orders          Ordered    meloxicam (MOBIC) 15 MG tablet  Daily        05/25/22 0731              Orpah Greek, MD 05/25/22 [phone]

## 2022-06-07 ENCOUNTER — Encounter (HOSPITAL_COMMUNITY): Payer: Self-pay | Admitting: Registered Nurse

## 2022-06-07 ENCOUNTER — Ambulatory Visit (HOSPITAL_COMMUNITY)
Admission: EM | Admit: 2022-06-07 | Discharge: 2022-06-08 | Disposition: A | Discharge status: Home / Self Care | Payer: Medicare Other | Attending: Registered Nurse | Admitting: Registered Nurse

## 2022-06-07 DIAGNOSIS — F209 Schizophrenia, unspecified: Secondary | ICD-10-CM | POA: Diagnosis present

## 2022-06-07 DIAGNOSIS — Z046 Encounter for general psychiatric examination, requested by authority: Secondary | ICD-10-CM

## 2022-06-07 DIAGNOSIS — Z5941 Food insecurity: Secondary | ICD-10-CM | POA: Diagnosis not present

## 2022-06-07 DIAGNOSIS — F203 Undifferentiated schizophrenia: Secondary | ICD-10-CM | POA: Diagnosis present

## 2022-06-07 DIAGNOSIS — C799 Secondary malignant neoplasm of unspecified site: Secondary | ICD-10-CM | POA: Diagnosis not present

## 2022-06-07 DIAGNOSIS — Z1152 Encounter for screening for COVID-19: Secondary | ICD-10-CM | POA: Insufficient documentation

## 2022-06-07 DIAGNOSIS — Z79899 Other long term (current) drug therapy: Secondary | ICD-10-CM | POA: Diagnosis not present

## 2022-06-07 LAB — COMPREHENSIVE METABOLIC PANEL
ALT: 24 U/L (ref 0–44)
AST: 36 U/L (ref 15–41)
Albumin: 5 g/dL (ref 3.5–5.0)
Alkaline Phosphatase: 95 U/L (ref 38–126)
Anion gap: 13 (ref 5–15)
BUN: 17 mg/dL (ref 6–20)
CO2: 22 mmol/L (ref 22–32)
Calcium: 10 mg/dL (ref 8.9–10.3)
Chloride: 104 mmol/L (ref 98–111)
Creatinine, Ser: 0.69 mg/dL (ref 0.44–1.00)
GFR, Estimated: >60 mL/min (ref >60)
Glucose, Bld: 91 mg/dL (ref 70–99)
Potassium: 3.6 mmol/L (ref 3.5–5.1)
Sodium: 139 mmol/L (ref 135–145)
Total Bilirubin: 0.8 mg/dL (ref 0.3–1.2)
Total Protein: 8 g/dL (ref 6.5–8.1)

## 2022-06-07 LAB — URINALYSIS, ROUTINE W REFLEX MICROSCOPIC
Bilirubin Urine: NEGATIVE
Glucose, UA: NEGATIVE mg/dL
Hgb urine dipstick: NEGATIVE
Ketones, ur: 80 mg/dL — AB
Nitrite: NEGATIVE
Protein, ur: 100 mg/dL — AB
Specific Gravity, Urine: 1.028 (ref 1.005–1.030)
pH: 5 (ref 5.0–8.0)

## 2022-06-07 LAB — LIPID PANEL
Cholesterol: 139 mg/dL (ref 0–200)
HDL: 55 mg/dL (ref >40)
LDL Cholesterol: 73 mg/dL (ref 0–99)
Total CHOL/HDL Ratio: 2.5 RATIO
Triglycerides: 53 mg/dL (ref <150)
VLDL: 11 mg/dL (ref 0–40)

## 2022-06-07 LAB — POCT URINE DRUG SCREEN - MANUAL ENTRY (I-SCREEN)
POC Amphetamine UR: NOT DETECTED
POC Buprenorphine (BUP): NOT DETECTED
POC Cocaine UR: NOT DETECTED
POC Marijuana UR: NOT DETECTED
POC Methadone UR: NOT DETECTED
POC Methamphetamine UR: NOT DETECTED
POC Morphine: NOT DETECTED
POC Oxazepam (BZO): NOT DETECTED
POC Oxycodone UR: NOT DETECTED
POC Secobarbital (BAR): NOT DETECTED

## 2022-06-07 LAB — CBC WITH DIFFERENTIAL/PLATELET
Abs Immature Granulocytes: 0.01 10*3/uL (ref 0.00–0.07)
Basophils Absolute: 0 10*3/uL (ref 0.0–0.1)
Basophils Relative: 0 %
Eosinophils Absolute: 0 10*3/uL (ref 0.0–0.5)
Eosinophils Relative: 0 %
HCT: 41.2 % (ref 36.0–46.0)
Hemoglobin: 13.5 g/dL (ref 12.0–15.0)
Immature Granulocytes: 0 %
Lymphocytes Relative: 24 %
Lymphs Abs: 1.2 10*3/uL (ref 0.7–4.0)
MCH: 27.2 pg (ref 26.0–34.0)
MCHC: 32.8 g/dL (ref 30.0–36.0)
MCV: 82.9 fL (ref 80.0–100.0)
Monocytes Absolute: 0.4 10*3/uL (ref 0.1–1.0)
Monocytes Relative: 8 %
Neutro Abs: 3.4 10*3/uL (ref 1.7–7.7)
Neutrophils Relative %: 68 %
Platelets: 69 10*3/uL — ABNORMAL LOW (ref 150–400)
RBC: 4.97 MIL/uL (ref 3.87–5.11)
RDW: 12.6 % (ref 11.5–15.5)
WBC: 5 10*3/uL (ref 4.0–10.5)
nRBC: 0 % (ref 0.0–0.2)

## 2022-06-07 LAB — RESP PANEL BY RT-PCR (FLU A&B, COVID) ARPGX2
Influenza A by PCR: NEGATIVE
Influenza B by PCR: NEGATIVE
SARS Coronavirus 2 by RT PCR: NEGATIVE

## 2022-06-07 LAB — POC SARS CORONAVIRUS 2 AG: SARSCOV2ONAVIRUS 2 AG: NEGATIVE

## 2022-06-07 LAB — ETHANOL: Alcohol, Ethyl (B): <10 mg/dL (ref <10)

## 2022-06-07 LAB — TSH: TSH: 1.336 u[IU]/mL (ref 0.350–4.500)

## 2022-06-07 LAB — MAGNESIUM: Magnesium: 2 mg/dL (ref 1.7–2.4)

## 2022-06-07 MED ORDER — MAGNESIUM HYDROXIDE 400 MG/5ML PO SUSP: 30.0000 mL | Freq: Every day | ORAL | Status: DC | PRN

## 2022-06-07 MED ORDER — TRAZODONE HCL 50 MG PO TABS
50.0000 mg | ORAL_TABLET | Freq: Every evening | ORAL | Status: DC | PRN
  Filled 2022-06-07: qty 1

## 2022-06-07 MED ORDER — ACETAMINOPHEN 325 MG PO TABS: 650.0000 mg | ORAL_TABLET | Freq: Four times a day (QID) | ORAL | Status: DC | PRN

## 2022-06-07 MED ORDER — HYDROXYZINE HCL 25 MG PO TABS
25.0000 mg | ORAL_TABLET | Freq: Three times a day (TID) | ORAL | Status: DC | PRN
  Filled 2022-06-07: qty 1

## 2022-06-07 MED ORDER — BENZTROPINE MESYLATE 0.5 MG PO TABS
0.5000 mg | ORAL_TABLET | Freq: Two times a day (BID) | ORAL | Status: DC
  Administered 2022-06-08: 0.5 mg via ORAL
  Filled 2022-06-07 (×2): qty 1

## 2022-06-07 MED ORDER — OLANZAPINE 5 MG PO TBDP: 5.0000 mg | ORAL_TABLET | Freq: Every day | ORAL | Status: DC

## 2022-06-07 MED ORDER — RISPERIDONE 1 MG PO TBDP
1.0000 mg | ORAL_TABLET | Freq: Two times a day (BID) | ORAL | Status: DC
  Filled 2022-06-07: qty 1

## 2022-06-07 MED ORDER — ALUM & MAG HYDROXIDE-SIMETH 200-200-20 MG/5ML PO SUSP: 30.0000 mL | ORAL | Status: DC | PRN

## 2022-06-07 NOTE — ED Provider Notes (Signed)
[facility] Continuous Assessment Admission H&P  Date: 06/07/22 Patient Name: Kaitlyn Duncan MRN: [id number] Chief Complaint:  Chief Complaint  Patient presents with   Schizophrenia      Diagnoses:  Final diagnoses:  Schizophrenia, unspecified type [facility])  Involuntary commitment    HPI: Kaitlyn Duncan 51 y.o., female patient presented to [facility] via [place] under involuntary commitment petition by counselor Kaitlyn Duncan ([facility]).  Per IVC:  "Respondent has been diagnosed with schizophrenia and metastatic melanoma.  She reports demons in her room.  She asked hotel staff to switch her room due to demons.  Hotel staff reports Jashiya has become very hostile over the past few days.  She believes people are coming into her room taking her stuff.  Respondent has attempted to break into other hotel rooms at 2:30 in the morning and yelling outside the door about demons.  She has taken a taxi twice and told the taxi driver that the hotel will pay for it.  Kemyah has stopped her meds on March 27, 2022.  Since stopping he exhibits paranoia, hyper-religious delusional thinking, and mood labial."  Kaitlyn Duncan, 51 y.o., female patient seen face to face by this provider, consulted with Dr. Hampton Abbot; and chart reviewed on 06/07/22.  On evaluation Kaitlyn Duncan reports she is not really sure why she was brought here "Kaitlyn Duncan).  Patient get irritated with questing.  Whee asked about suicidal/homicidal ideation. "Why do you keep asking these questions?  Have you heard anything that I have said?  Do you not listen?  I don't need to be asked the same thing over and over.  I speak the truth.  I know what is right and what is wrong.  I am a child of [name].  I am not a mental patient.  No I don't want to kill myself.  No I don't want to kill other people."  Patient states that she has not been calling everyone demons.  "I am a child of [name] and if you are not a child  of [name] then some are demonic.  But not everybody."   Patient states that she did not asked to be moved to another room at the motel because demons were in her room.  "I talked to the manager and told her that the rooms on each side of me had smokers and all they do is puff and puff all day.  I don't like being around second hand smoke.  I've had issues with being around smokers before and I don't want to be around second hand smoke.  It's not good for your health."   Patient states that she is staying at the [facility] [phone].  Gives permission to speak to Therapist, art.  Also gives permission to speak to [place], and [name] [phone].  Patient states that her guardian Kaitlyn Duncan doesn't really know anything about her.  "But you can talk to her if you want to.  She don't know nothing about me."   During evaluation Kaitlyn Duncan is sitting in chair with no noted distress.  She is alert, oriented x 4, calm, cooperative, but irritable about being brought to [facility] on "bull hockey."  She has normal speech, and behavior.  She is hyper-religious with delusional thinking that most who are not the child of [name] are demonic.  She denies suicidal/self-harm/homicidal ideation, psychosis, and paranoia.    Collateral Information:  Spoke with Education officer, environmental at [employer]  [place]) [phone].  Kaitlyn Duncan states that patient has been staying at [facility] for 21 weeks and thing have been fine up to a month ago "That's when I notice a decline and her not acting like normal does.  She has bang on the doors of other motel guest saying her sister was in the room, She took a taxi the other day and didn't have the money to pay for it so told the driver that the motel would pay.  I had to pay because he wouldn't leave until paid.  Now her guardian owes the money for the taxi.  She told me today and told me that she had talk to me and I had told her that she could stay here for free and I told her that I didn't tell her  that.  One day she went up to the second floor by stairs and as you can see she ain't that steady."  Kaitlyn Duncan states that patient is able to return to the motel and stay until her guardian has found a place for her to stay.  Kaitlyn Duncan states she spoke to the patients guardian today and told her that she feels that patient needed to be place somewhere else, where she had people to help her.  I just don't feel that she needs to be here caring for herself."  States that she did not tell the guardian that patient could not return to the motel.  Spoke with Freight forwarder at [employer] ([facility]  States she is aware of patient and that patient is hyper religious at base line but patient had been off of her medications and has become more irritable, mood swings, any hyper religion has worsened.  "She has been a client for the past 6 months.  Typically she is hyper religious with the demons being around.  At first we were able to get her to go to her doctor appointments, and participate in other programs but for the last 2-3 weeks she has been less engaging with staff and talking more about the demons."  Kaitlyn Duncan states that patient was taking Zyprexa 10 mg but was in the process of switching to Perseris but patient never got around to getting injection.  States that [facility] will pick patient up in the morning want to make sure patient is able to return to motel.      PHQ 2-9:  [place] ED from 06/07/2022 in [facility]  Thoughts that you would be better off dead, or of hurting yourself in some way Not at all  PHQ-9 Total Score 4       [place] ED from 06/07/2022 in [facility] from 05/25/2022 in [place] DEPT ED from 05/08/2022 in [place] No Risk No Risk No Risk        Total Time spent with patient: 45 minutes  Musculoskeletal  Strength & Muscle  Tone: within normal limits Gait & Station:  uses cane to assist with ambulation Patient leans: N/A  Psychiatric Specialty Exam  Presentation General Appearance:  Appropriate for Environment  Eye Contact: Good  Speech: Clear and Coherent; Normal Rate  Speech Volume: Normal  Handedness: Right   Mood and Affect  Mood: Irritable  Affect: Congruent   Thought Process  Thought Processes: Coherent; Linear  Descriptions of Associations:Intact  Orientation:Full (Time, Place and Person)  Thought Content:Logical; Rumination; Delusions  Diagnosis of Schizophrenia or Schizoaffective  disorder in past: Yes  Duration of Psychotic Symptoms: Greater than six months  Hallucinations:Hallucinations: None  Ideas of Reference:Delusions (Hyper religious people that are a child of [name] is demonic)  Suicidal Thoughts:Suicidal Thoughts: No  Homicidal Thoughts:Homicidal Thoughts: No   Sensorium  Memory: Immediate Fair; Recent Fair  Judgment: Fair  Insight: Shallow; Fair   Community education officer  Concentration: Fair  Attention Span: Fair  Recall: [employer] of Knowledge: Fair  Language: Fair   Psychomotor Activity  Psychomotor Activity: Psychomotor Activity: Normal   Assets  Assets: Armed forces logistics/support/administrative officer; Financial Resources/Insurance; Housing; Leisure Time; Social Support   Sleep  Sleep: Sleep: Fair   Nutritional Assessment (For OBS and FBC admissions only) Has the patient had a weight loss or gain of 10 pounds or more in the last 3 months?: No Has the patient had a decrease in food intake/or appetite?: No Does the patient have dental problems?: No Does the patient have eating habits or behaviors that may be indicators of an eating disorder including binging or inducing vomiting?: No Has the patient recently lost weight without trying?: 0 Has the patient been eating poorly because of a decreased appetite?: 0 Malnutrition Screening Tool Score:  0    Physical Exam Vitals and nursing note reviewed.  Constitutional:      General: She is not in acute distress.    Appearance: She is well-developed.  HENT:     Head: Normocephalic and atraumatic.  Eyes:     Conjunctiva/sclera: Conjunctivae normal.  Cardiovascular:     Rate and Rhythm: Normal rate and regular rhythm.     Heart sounds: No murmur heard. Pulmonary:     Effort: Pulmonary effort is normal. No respiratory distress.     Breath sounds: Normal breath sounds.  Abdominal:     Palpations: Abdomen is soft.     Tenderness: There is no abdominal tenderness.  Musculoskeletal:        General: No swelling.     Cervical back: Neck supple.  Skin:    General: Skin is warm and dry.     Capillary Refill: Capillary refill takes less than 2 seconds.  Neurological:     Mental Status: She is alert.  Psychiatric:        Attention and Perception: Perception normal.        Mood and Affect: Mood normal.        Behavior: Behavior is agitated. Behavior is cooperative.        Thought Content: Thought content is delusional. Thought content does not include homicidal or suicidal ideation. Thought content does not include suicidal plan.        Judgment: Judgment is impulsive.    Review of Systems  Constitutional: Negative.   HENT: Negative.    Respiratory:  Negative for cough, shortness of breath and wheezing.   Cardiovascular:  Negative for chest pain.  Musculoskeletal:  Positive for joint pain and myalgias.  Psychiatric/Behavioral:  Depression: Stablae. Hallucinations: Denies. Substance abuse: Denies. Suicidal ideas: Denies. The patient is nervous/anxious.     Blood pressure 125/89, pulse 97, temperature 98.2 F (36.8 C), temperature source Oral, resp. rate 18, SpO2 100 %. There is no height or weight on file to calculate BMI.  Past Psychiatric History: Adjustment disorder, depression, schizophrenia   Is the patient at risk to self? No  Has the patient been a risk to self in the  past 6 months? No .    Has the patient been a risk to self within the distant  past? No   Is the patient a risk to others? No   Has the patient been a risk to others in the past 6 months? No   Has the patient been a risk to others within the distant past? No   Past Medical History:  Past Medical History:  Diagnosis Date   Allergy    Anemia    Gallstones    GERD (gastroesophageal reflux disease)    Hemorrhoids    melanoma with met dz dx'd 01/2018   brain and adrenal   Seasonal allergies    Stroke [facility])     Past Surgical History:  Procedure Laterality Date   BIOPSY  01/28/2018   Procedure: BIOPSY;  Surgeon: Laurence Spates, MD;  Location: [facility];  Service: Endoscopy;;   BREAST BIOPSY Left    ESOPHAGOGASTRODUODENOSCOPY N/A 01/28/2018   Procedure: ESOPHAGOGASTRODUODENOSCOPY (EGD);  Surgeon: Laurence Spates, MD;  Location: [facility];  Service: Endoscopy;  Laterality: N/A;   MOUTH SURGERY     OVARY SURGERY     removed "something"    Family History:  Family History  Problem Relation Age of Onset   Melanoma Mother    Hypertension Father    Breast cancer Maternal Aunt    Breast cancer Paternal Aunt    Diabetes Paternal Aunt    Breast cancer Maternal Grandmother    Breast cancer Paternal Grandmother    Diabetes Paternal Grandmother    Colon cancer Neg Hx    Esophageal cancer Neg Hx    Pancreatic cancer Neg Hx    Stomach cancer Neg Hx    Liver disease Neg Hx    Rectal cancer Neg Hx     Social History:  Social History   Socioeconomic History   Marital status: Single    Spouse name: Not on file   Number of children: Not on file   Years of education: Not on file   Highest education level: Not on file  Occupational History   Not on file  Tobacco Use   Smoking status: Never   Smokeless tobacco: Never  Vaping Use   Vaping Use: Never used  Substance and Sexual Activity   Alcohol use: No   Drug use: No   Sexual activity: Not Currently  Other Topics Concern   Not  on file  Social History Narrative   Not on file   Social Determinants of Health   Financial Resource Strain: Not on file  Food Insecurity: Food Insecurity Present (04/09/2022)   Hunger Vital Sign    Worried About Running Out of Food in the Last Year: Sometimes true    Ran Out of Food in the Last Year: Sometimes true  Transportation Needs: Unmet Transportation Needs (04/09/2022)   PRAPARE - Hydrologist (Medical): Yes    Lack of Transportation (Non-Medical): Yes  Physical Activity: Not on file  Stress: Not on file  Social Connections: Not on file  Intimate Partner Violence: Not At Risk (04/09/2022)   Humiliation, Afraid, Rape, and Kick questionnaire    Fear of Current or Ex-Partner: No    Emotionally Abused: No    Physically Abused: No    Sexually Abused: No    SDOH:  SDOH Screenings   Food Insecurity: Food Insecurity Present (04/09/2022)  Housing: High Risk (04/09/2022)  Transportation Needs: Unmet Transportation Needs (04/09/2022)  Utilities: Not At Risk (04/09/2022)  Alcohol Screen: Low Risk  (06/27/2021)  Depression (PHQ2-9): Low Risk  (06/07/2022)  Tobacco Use: Low Risk  (06/07/2022)  Last Labs:  Admission on 05/08/2022, Discharged on 05/08/2022  Component Date Value Ref Range Status   Sodium 05/08/2022 135  135 - 145 mmol/L Final   Potassium 05/08/2022 3.4 (L)  3.5 - 5.1 mmol/L Final   Chloride 05/08/2022 105  98 - 111 mmol/L Final   CO2 05/08/2022 22  22 - 32 mmol/L Final   Glucose, Bld 05/08/2022 102 (H)  70 - 99 mg/dL Final   Glucose reference range applies only to samples taken after fasting for at least 8 hours.   BUN 05/08/2022 18  6 - 20 mg/dL Final   Creatinine, Ser 05/08/2022 0.49  0.44 - 1.00 mg/dL Final   Calcium 05/08/2022 9.0  8.9 - 10.3 mg/dL Final   GFR, Estimated 05/08/2022 >60  >60 mL/min Final   Comment: (NOTE) Calculated using the CKD-EPI Creatinine Equation (2021)    Anion gap 05/08/2022 8  5 - 15 Final   Performed  at [facility], [address] [place], [place] [address]   WBC 05/08/2022 5.5  4.0 - 10.5 K/uL Final   RBC 05/08/2022 4.96  3.87 - 5.11 MIL/uL Final   Hemoglobin 05/08/2022 13.5  12.0 - 15.0 g/dL Final   HCT 05/08/2022 40.4  36.0 - 46.0 % Final   MCV 05/08/2022 81.5  80.0 - 100.0 fL Final   MCH 05/08/2022 27.2  26.0 - 34.0 pg Final   MCHC 05/08/2022 33.4  30.0 - 36.0 g/dL Final   RDW 05/08/2022 12.4  11.5 - 15.5 % Final   Platelets 05/08/2022 63 (L)  150 - 400 K/uL Final   Comment: REPEATED TO VERIFY PLATELET COUNT CONFIRMED BY SMEAR    nRBC 05/08/2022 0.0  0.0 - 0.2 % Final   Neutrophils Relative % 05/08/2022 65  % Final   Neutro Abs 05/08/2022 3.6  1.7 - 7.7 K/uL Final   Lymphocytes Relative 05/08/2022 25  % Final   Lymphs Abs 05/08/2022 1.4  0.7 - 4.0 K/uL Final   Monocytes Relative 05/08/2022 10  % Final   Monocytes Absolute 05/08/2022 0.5  0.1 - 1.0 K/uL Final   Eosinophils Relative 05/08/2022 0  % Final   Eosinophils Absolute 05/08/2022 0.0  0.0 - 0.5 K/uL Final   Basophils Relative 05/08/2022 0  % Final   Basophils Absolute 05/08/2022 0.0  0.0 - 0.1 K/uL Final   Immature Granulocytes 05/08/2022 0  % Final   Abs Immature Granulocytes 05/08/2022 0.01  0.00 - 0.07 K/uL Final   Performed at [facility], [address] [place], [place] [address]   SARS Coronavirus 2 by RT PCR 05/08/2022 NEGATIVE  NEGATIVE Final   Comment: (NOTE) SARS-CoV-2 target nucleic acids are NOT DETECTED.  The SARS-CoV-2 RNA is generally detectable in upper respiratory specimens during the acute phase of infection. The lowest concentration of SARS-CoV-2 viral copies this assay can detect is 138 copies/mL. A negative result does not preclude SARS-Cov-2 infection and should not be used as the sole basis for treatment or other patient management decisions. A negative result may occur with  improper specimen collection/handling, submission of specimen other than nasopharyngeal  swab, presence of viral mutation(s) within the areas targeted by this assay, and inadequate number of viral copies(<138 copies/mL). A negative result must be combined with clinical observations, patient history, and epidemiological information. The expected result is Negative.  Fact Sheet for Patients:  EntrepreneurPulse.com.au  Fact Sheet for Healthcare Providers:  IncredibleEmployment.be  This test is no  t yet approved or cleared by the [place] and  has been authorized for detection and/or diagnosis of SARS-CoV-2 by FDA under an Emergency Use Authorization (EUA). This EUA will remain  in effect (meaning this test can be used) for the duration of the COVID-19 declaration under Section 564(b)(1) of the Act, 21 U.S.C.section 360bbb-3(b)(1), unless the authorization is terminated  or revoked sooner.       Influenza A by PCR 05/08/2022 NEGATIVE  NEGATIVE Final   Influenza B by PCR 05/08/2022 NEGATIVE  NEGATIVE Final   Comment: (NOTE) The Xpert Xpress SARS-CoV-2/FLU/RSV plus assay is intended as an aid in the diagnosis of influenza from Nasopharyngeal swab specimens and should not be used as a sole basis for treatment. Nasal washings and aspirates are unacceptable for Xpert Xpress SARS-CoV-2/FLU/RSV testing.  Fact Sheet for Patients: EntrepreneurPulse.com.au  Fact Sheet for Healthcare Providers: IncredibleEmployment.be  This test is not yet approved or cleared by the [place] FDA and has been authorized for detection and/or diagnosis of SARS-CoV-2 by FDA under an Emergency Use Authorization (EUA). This EUA will remain in effect (meaning this test can be used) for the duration of the COVID-19 declaration under Section 564(b)(1) of the Act, 21 U.S.C. section 360bbb-3(b)(1), unless the authorization is terminated or revoked.  Performed at [facility], [address] [place], [place] [address]   Admission on 04/08/2022, Discharged on 04/13/2022  Component Date Value Ref Range Status   WBC 04/08/2022 4.6  4.0 - 10.5 K/uL Final   RBC 04/08/2022 4.78  3.87 - 5.11 MIL/uL Final   Hemoglobin 04/08/2022 13.1  12.0 - 15.0 g/dL Final   HCT 04/08/2022 40.5  36.0 - 46.0 % Final   MCV 04/08/2022 84.7  80.0 - 100.0 fL Final   MCH 04/08/2022 27.4  26.0 - 34.0 pg Final   MCHC 04/08/2022 32.3  30.0 - 36.0 g/dL Final   RDW 04/08/2022 13.1  11.5 - 15.5 % Final   Platelets 04/08/2022 74 (L)  150 - 400 K/uL Final   Comment: SPECIMEN CHECKED FOR CLOTS Immature Platelet Fraction may be clinically indicated, consider ordering this additional test [id number] CONSISTENT WITH PREVIOUS RESULT REPEATED TO VERIFY    nRBC 04/08/2022 0.0  0.0 - 0.2 % Final   Neutrophils Relative % 04/08/2022 64  % Final   Neutro Abs 04/08/2022 2.9  1.7 - 7.7 K/uL Final   Lymphocytes Relative 04/08/2022 25  % Final   Lymphs Abs 04/08/2022 1.1  0.7 - 4.0 K/uL Final   Monocytes Relative 04/08/2022 11  % Final   Monocytes Absolute 04/08/2022 0.5  0.1 - 1.0 K/uL Final   Eosinophils Relative 04/08/2022 0  % Final   Eosinophils Absolute 04/08/2022 0.0  0.0 - 0.5 K/uL Final   Basophils Relative 04/08/2022 0  % Final   Basophils Absolute 04/08/2022 0.0  0.0 - 0.1 K/uL Final   Immature Granulocytes 04/08/2022 0  % Final   Abs Immature Granulocytes 04/08/2022 0.01  0.00 - 0.07 K/uL Final   Performed at [facility], [place] [address] [place], [place] [address]   Sodium 04/08/2022 137  135 - 145 mmol/L Final   Potassium 04/08/2022 3.8  3.5 - 5.1 mmol/L Final   Chloride 04/08/2022 108  98 - 111 mmol/L Final   CO2 04/08/2022 22  22 - 32 mmol/L Final   Glucose, Bld 04/08/2022 103 (H)  70 - 99 mg/dL Final   Glucose reference range applies only to samples taken  after fasting for at least 8 hours.   BUN 04/08/2022 19  6 - 20 mg/dL Final   Creatinine, Ser 04/08/2022 0.64   0.44 - 1.00 mg/dL Final   Calcium 04/08/2022 9.1  8.9 - 10.3 mg/dL Final   GFR, Estimated 04/08/2022 >60  >60 mL/min Final   Comment: (NOTE) Calculated using the CKD-EPI Creatinine Equation (2021)    Anion gap 04/08/2022 7  5 - 15 Final   Performed at [facility], [place] [address], [place], [place] [address]   Glucose-Capillary 04/08/2022 114 (H)  70 - 99 mg/dL Final   Glucose reference range applies only to samples taken after fasting for at least 8 hours.   Color, Urine 04/08/2022 YELLOW  YELLOW Final   APPearance 04/08/2022 CLEAR  CLEAR Final   Specific Gravity, Urine 04/08/2022 1.017  1.005 - 1.030 Final   pH 04/08/2022 7.0  5.0 - 8.0 Final   Glucose, UA 04/08/2022 NEGATIVE  NEGATIVE mg/dL Final   Hgb urine dipstick 04/08/2022 NEGATIVE  NEGATIVE Final   Bilirubin Urine 04/08/2022 NEGATIVE  NEGATIVE Final   Ketones, ur 04/08/2022 NEGATIVE  NEGATIVE mg/dL Final   Protein, ur 04/08/2022 NEGATIVE  NEGATIVE mg/dL Final   Nitrite 04/08/2022 NEGATIVE  NEGATIVE Final   Leukocytes,Ua 04/08/2022 NEGATIVE  NEGATIVE Final   Performed at [facility], [place] [address], [place], [place] [address]   Cholesterol 04/09/2022 218 (H)  0 - 200 mg/dL Final   Triglycerides 04/09/2022 139  <150 mg/dL Final   HDL 04/09/2022 38 (L)  >40 mg/dL Final   Total CHOL/HDL Ratio 04/09/2022 5.7  RATIO Final   VLDL 04/09/2022 28  0 - 40 mg/dL Final   LDL Cholesterol 04/09/2022 152 (H)  0 - 99 mg/dL Final   Comment:        Total Cholesterol/HDL:CHD Risk Coronary Heart Disease Risk Table                     Men   Women  1/2 Average Risk   3.4   3.3  Average Risk       5.0   4.4  2 X Average Risk   9.6   7.1  3 X Average Risk  23.4   11.0        Use the calculated Patient Ratio above and the CHD Risk Table to determine the patient's CHD Risk.        ATP III CLASSIFICATION (LDL):  <100     mg/dL   Optimal  100-129  mg/dL   Near or Above                    Optimal  130-159   mg/dL   Borderline  160-189  mg/dL   High  >190     mg/dL   Very High Performed at [place] [address] [place], [place] [address]    Hgb A1c MFr Bld 04/08/2022 5.3  4.8 - 5.6 % Final   Comment: (NOTE) Pre diabetes:          5.7%-6.4%  Diabetes:              >6.4%  Glycemic control for   <7.0% adults with diabetes    Mean Plasma Glucose 04/08/2022 105.41  mg/dL Final   Performed at [facility], [place] [address], [place], [place] [address]   WBC 04/09/2022 4.9  4.0 - 10.5 K/uL Final   RBC 04/09/2022  4.52  3.87 - 5.11 MIL/uL Final   Hemoglobin 04/09/2022 12.5  12.0 - 15.0 g/dL Final   HCT 04/09/2022 36.9  36.0 - 46.0 % Final   MCV 04/09/2022 81.6  80.0 - 100.0 fL Final   MCH 04/09/2022 27.7  26.0 - 34.0 pg Final   MCHC 04/09/2022 33.9  30.0 - 36.0 g/dL Final   RDW 04/09/2022 13.0  11.5 - 15.5 % Final   Platelets 04/09/2022 62 (L)  150 - 400 K/uL Final   Comment: Immature Platelet Fraction may be clinically indicated, consider ordering this additional test [id number] REPEATED TO VERIFY    nRBC 04/09/2022 0.0  0.0 - 0.2 % Final   Neutrophils Relative % 04/09/2022 60  % Final   Neutro Abs 04/09/2022 2.9  1.7 - 7.7 K/uL Final   Lymphocytes Relative 04/09/2022 29  % Final   Lymphs Abs 04/09/2022 1.4  0.7 - 4.0 K/uL Final   Monocytes Relative 04/09/2022 11  % Final   Monocytes Absolute 04/09/2022 0.5  0.1 - 1.0 K/uL Final   Eosinophils Relative 04/09/2022 0  % Final   Eosinophils Absolute 04/09/2022 0.0  0.0 - 0.5 K/uL Final   Basophils Relative 04/09/2022 0  % Final   Basophils Absolute 04/09/2022 0.0  0.0 - 0.1 K/uL Final   Immature Granulocytes 04/09/2022 0  % Final   Abs Immature Granulocytes 04/09/2022 0.02  0.00 - 0.07 K/uL Final   Performed at [facility], [place] [address], [place], [place] [address]   Sodium 04/09/2022 137  135 - 145 mmol/L Final   Potassium 04/09/2022 3.2 (L)  3.5 - 5.1 mmol/L Final   Chloride 04/09/2022 103  98 - 111 mmol/L  Final   CO2 04/09/2022 24  22 - 32 mmol/L Final   Glucose, Bld 04/09/2022 118 (H)  70 - 99 mg/dL Final   Glucose reference range applies only to samples taken after fasting for at least 8 hours.   BUN 04/09/2022 14  6 - 20 mg/dL Final   Creatinine, Ser 04/09/2022 0.71  0.44 - 1.00 mg/dL Final   Calcium 04/09/2022 9.8  8.9 - 10.3 mg/dL Final   Total Protein 04/09/2022 6.7  6.5 - 8.1 g/dL Final   Albumin 04/09/2022 3.7  3.5 - 5.0 g/dL Final   AST 04/09/2022 26  15 - 41 U/L Final   ALT 04/09/2022 21  0 - 44 U/L Final   Alkaline Phosphatase 04/09/2022 90  38 - 126 U/L Final   Total Bilirubin 04/09/2022 0.6  0.3 - 1.2 mg/dL Final   GFR, Estimated 04/09/2022 >60  >60 mL/min Final   Comment: (NOTE) Calculated using the CKD-EPI Creatinine Equation (2021)    Anion gap 04/09/2022 10  5 - 15 Final   Performed at [place] [address], [place], [place] [address]   Magnesium 04/09/2022 1.9  1.7 - 2.4 mg/dL Final   Performed at [place] [address], [place], [place] [address]   Weight 04/09/2022 2,264.57  oz Final   Height 04/09/2022 60  in Final   BP 04/09/2022 129/92  mmHg Final   Single Plane A2C EF 04/09/2022 56.7  % Final   Single Plane A4C EF 04/09/2022 54.4  % Final   Calc EF 04/09/2022 56.3  % Final   S' Lateral 04/09/2022 2.60  cm Final   Area-P 1/2 04/09/2022 5.27  cm2 Final   Opiates 04/09/2022 NONE DETECTED  NONE DETECTED Final   Cocaine 04/09/2022 NONE  DETECTED  NONE DETECTED Final   Benzodiazepines 04/09/2022 NONE DETECTED  NONE DETECTED Final   Amphetamines 04/09/2022 NONE DETECTED  NONE DETECTED Final   Tetrahydrocannabinol 04/09/2022 NONE DETECTED  NONE DETECTED Final   Barbiturates 04/09/2022 NONE DETECTED  NONE DETECTED Final   Comment: (NOTE) DRUG SCREEN FOR MEDICAL PURPOSES ONLY.  IF CONFIRMATION IS NEEDED FOR ANY PURPOSE, NOTIFY LAB WITHIN 5 DAYS.  LOWEST DETECTABLE LIMITS FOR URINE DRUG SCREEN Drug Class                     Cutoff  (ng/mL) Amphetamine and metabolites    1000 Barbiturate and metabolites    200 Benzodiazepine                 536 Tricyclics and metabolites     300 Opiates and metabolites        300 Cocaine and metabolites        300 THC                            50 Performed at [facility], [place] [address], [place], [place] [address]    Sodium 04/10/2022 132 (L)  135 - 145 mmol/L Final   Potassium 04/10/2022 3.5  3.5 - 5.1 mmol/L Final   Chloride 04/10/2022 101  98 - 111 mmol/L Final   CO2 04/10/2022 20 (L)  22 - 32 mmol/L Final   Glucose, Bld 04/10/2022 96  70 - 99 mg/dL Final   Glucose reference range applies only to samples taken after fasting for at least 8 hours.   BUN 04/10/2022 11  6 - 20 mg/dL Final   Creatinine, Ser 04/10/2022 0.66  0.44 - 1.00 mg/dL Final   Calcium 04/10/2022 8.9  8.9 - 10.3 mg/dL Final   GFR, Estimated 04/10/2022 >60  >60 mL/min Final   Comment: (NOTE) Calculated using the CKD-EPI Creatinine Equation (2021)    Anion gap 04/10/2022 11  5 - 15 Final   Performed at [facility], [place] [address], [place], [place] [address]   WBC 04/10/2022 4.6  4.0 - 10.5 K/uL Final   RBC 04/10/2022 4.40  3.87 - 5.11 MIL/uL Final   Hemoglobin 04/10/2022 12.3  12.0 - 15.0 g/dL Final   HCT 04/10/2022 36.2  36.0 - 46.0 % Final   MCV 04/10/2022 82.3  80.0 - 100.0 fL Final   MCH 04/10/2022 28.0  26.0 - 34.0 pg Final   MCHC 04/10/2022 34.0  30.0 - 36.0 g/dL Final   RDW 04/10/2022 12.8  11.5 - 15.5 % Final   Platelets 04/10/2022 64 (L)  150 - 400 K/uL Final   Comment: Immature Platelet Fraction may be clinically indicated, consider ordering this additional test [id number] REPEATED TO VERIFY    nRBC 04/10/2022 0.0  0.0 - 0.2 % Final   Performed at [facility], [place] [address] [place], [place] [address]   Sodium 04/12/2022 137  135 - 145 mmol/L Final   Potassium 04/12/2022 4.0  3.5 - 5.1 mmol/L Final   Chloride 04/12/2022 103  98 - 111 mmol/L Final   CO2 04/12/2022 25   22 - 32 mmol/L Final   Glucose, Bld 04/12/2022 93  70 - 99 mg/dL Final   Glucose reference range applies only to samples taken after fasting for at least 8 hours.   BUN 04/12/2022 16  6 - 20 mg/dL Final   Creatinine, Ser 04/12/2022 0.63  0.44 - 1.00 mg/dL Final   Calcium 04/12/2022 9.8  8.9 - 10.3 mg/dL Final   GFR, Estimated 04/12/2022 >60  >60 mL/min Final   Comment: (NOTE) Calculated using the CKD-EPI Creatinine Equation (2021)    Anion gap 04/12/2022 9  5 - 15 Final   Performed at [facility], [place] [address], [place], [place] [address]   WBC 04/12/2022 4.0  4.0 - 10.5 K/uL Final   RBC 04/12/2022 4.55  3.87 - 5.11 MIL/uL Final   Hemoglobin 04/12/2022 12.6  12.0 - 15.0 g/dL Final   HCT 04/12/2022 37.9  36.0 - 46.0 % Final   MCV 04/12/2022 83.3  80.0 - 100.0 fL Final   MCH 04/12/2022 27.7  26.0 - 34.0 pg Final   MCHC 04/12/2022 33.2  30.0 - 36.0 g/dL Final   RDW 04/12/2022 12.8  11.5 - 15.5 % Final   Platelets 04/12/2022 60 (L)  150 - 400 K/uL Final   Comment: Immature Platelet Fraction may be clinically indicated, consider ordering this additional test [id number] REPEATED TO VERIFY    nRBC 04/12/2022 0.0  0.0 - 0.2 % Final   Performed at [facility], [place] [address] [place], [place] [address]  Office Visit on 02/24/2022  Component Date Value Ref Range Status   Color, UA 02/24/2022 yellow   Final   Clarity, UA 02/24/2022 clear   Final   Glucose, UA 02/24/2022 Negative  Negative Final   Bilirubin, UA 02/24/2022 negative   Final   Ketones, UA 02/24/2022 negative   Final   Spec Grav, UA 02/24/2022 1.025  1.010 - 1.025 Final   Blood, UA 02/24/2022 negative   Final   pH, UA 02/24/2022 7.0  5.0 - 8.0 Final   Protein, UA 02/24/2022 Negative  Negative Final   Urobilinogen, UA 02/24/2022 1.0  0.2 or 1.0 E.U./dL Final   Nitrite, UA 02/24/2022 negative   Final   Leukocytes, UA 02/24/2022 Negative  Negative Final  Appointment on 02/02/2022  Component Date Value Ref Range  Status   Sodium 02/02/2022 138  135 - 145 mmol/L Final   Potassium 02/02/2022 3.8  3.5 - 5.1 mmol/L Final   Chloride 02/02/2022 105  98 - 111 mmol/L Final   CO2 02/02/2022 28  22 - 32 mmol/L Final   Glucose, Bld 02/02/2022 101 (H)  70 - 99 mg/dL Final   Glucose reference range applies only to samples taken after fasting for at least 8 hours.   BUN 02/02/2022 19  6 - 20 mg/dL Final   Creatinine 02/02/2022 0.66  0.44 - 1.00 mg/dL Final   Calcium 02/02/2022 9.4  8.9 - 10.3 mg/dL Final   Total Protein 02/02/2022 7.7  6.5 - 8.1 g/dL Final   Albumin 02/02/2022 4.5  3.5 - 5.0 g/dL Final   AST 02/02/2022 24  15 - 41 U/L Final   ALT 02/02/2022 17  0 - 44 U/L Final   Alkaline Phosphatase 02/02/2022 87  38 - 126 U/L Final   Total Bilirubin 02/02/2022 0.4  0.3 - 1.2 mg/dL Final   GFR, Estimated 02/02/2022 >60  >60 mL/min Final   Comment: (NOTE) Calculated using the CKD-EPI Creatinine Equation (2021)    Anion gap 02/02/2022 5  5 - 15 Final   Performed at [facility], [place] [address], [place], [place] [address]   WBC Count 02/02/2022 3.8 (L)  4.0 - 10.5 K/uL Final   RBC 02/02/2022 4.79  3.87 - 5.11 MIL/uL Final  Hemoglobin 02/02/2022 13.1  12.0 - 15.0 g/dL Final   HCT 02/02/2022 39.7  36.0 - 46.0 % Final   MCV 02/02/2022 82.9  80.0 - 100.0 fL Final   MCH 02/02/2022 27.3  26.0 - 34.0 pg Final   MCHC 02/02/2022 33.0  30.0 - 36.0 g/dL Final   RDW 02/02/2022 13.0  11.5 - 15.5 % Final   Platelet Count 02/02/2022 67 (L)  150 - 400 K/uL Final   nRBC 02/02/2022 0.0  0.0 - 0.2 % Final   Neutrophils Relative % 02/02/2022 61  % Final   Neutro Abs 02/02/2022 2.3  1.7 - 7.7 K/uL Final   Lymphocytes Relative 02/02/2022 28  % Final   Lymphs Abs 02/02/2022 1.1  0.7 - 4.0 K/uL Final   Monocytes Relative 02/02/2022 11  % Final   Monocytes Absolute 02/02/2022 0.4  0.1 - 1.0 K/uL Final   Eosinophils Relative 02/02/2022 0  % Final   Eosinophils Absolute 02/02/2022 0.0  0.0 - 0.5 K/uL  Final   Basophils Relative 02/02/2022 0  % Final   Basophils Absolute 02/02/2022 0.0  0.0 - 0.1 K/uL Final   Immature Granulocytes 02/02/2022 0  % Final   Abs Immature Granulocytes 02/02/2022 0.01  0.00 - 0.07 K/uL Final   Performed at [facility], [place] [address], [place], [place] [address]  Office Visit on 12/24/2021  Component Date Value Ref Range Status   Color, UA 12/24/2021 yellow   Final   Clarity, UA 12/24/2021 cloudy   Final   Glucose, UA 12/24/2021 Negative  Negative Final   Bilirubin, UA 12/24/2021 negative   Final   Ketones, UA 12/24/2021 negative   Final   Spec Grav, UA 12/24/2021 >=1.030 (A)  1.010 - 1.025 Final   Blood, UA 12/24/2021 negative   Final   pH, UA 12/24/2021 6.0  5.0 - 8.0 Final   Protein, UA 12/24/2021 Positive (A)  Negative Final   15 mg/dL   Urobilinogen, UA 12/24/2021 0.2  0.2 or 1.0 E.U./dL Final   Nitrite, UA 12/24/2021 negative   Final   Leukocytes, UA 12/24/2021 Negative  Negative Final   Hgb A1c MFr Bld 12/24/2021 5.4  4.6 - 6.5 % Final   Glycemic Control Guidelines for People with Diabetes:Non Diabetic:  <6%Goal of Therapy: <7%Additional Action Suggested:  >8%    Sodium 12/24/2021 138  135 - 145 mEq/L Final   Potassium 12/24/2021 3.7  3.5 - 5.1 mEq/L Final   Chloride 12/24/2021 102  96 - 112 mEq/L Final   CO2 12/24/2021 31  19 - 32 mEq/L Final   Glucose, Bld 12/24/2021 94  70 - 99 mg/dL Final   BUN 12/24/2021 23  6 - 23 mg/dL Final   Creatinine, Ser 12/24/2021 0.65  0.40 - 1.20 mg/dL Final   Total Bilirubin 12/24/2021 0.5  0.2 - 1.2 mg/dL Final   Alkaline Phosphatase 12/24/2021 96  39 - 117 U/L Final   AST 12/24/2021 22  0 - 37 U/L Final   ALT 12/24/2021 14  0 - 35 U/L Final   Total Protein 12/24/2021 7.9  6.0 - 8.3 g/dL Final   Albumin 12/24/2021 4.7  3.5 - 5.2 g/dL Final   GFR 12/24/2021 100.73  >60.00 mL/min Final   Calculated using the CKD-EPI Creatinine Equation (2021)   Calcium 12/24/2021 9.7  8.4 - 10.5 mg/dL  Final   WBC 12/24/2021 4.0  4.0 - 10.5 K/uL Final   RBC 12/24/2021 4.89  3.87 - 5.11 Mil/uL  Final   Hemoglobin 12/24/2021 13.5  12.0 - 15.0 g/dL Final   HCT 12/24/2021 40.5  36.0 - 46.0 % Final   MCV 12/24/2021 82.8  78.0 - 100.0 fl Final   MCHC 12/24/2021 33.3  30.0 - 36.0 g/dL Final   RDW 12/24/2021 13.0  11.5 - 15.5 % Final   Platelets 12/24/2021 64.0 Repeated and verified X2. (L)  150.0 - 400.0 K/uL Final   Manual smear review agrees with instrument differential.   Neutrophils Relative % 12/24/2021 62.2  43.0 - 77.0 % Final   Lymphocytes Relative 12/24/2021 26.7  12.0 - 46.0 % Final   Monocytes Relative 12/24/2021 10.9  3.0 - 12.0 % Final   Eosinophils Relative 12/24/2021 0.0  0.0 - 5.0 % Final   Basophils Relative 12/24/2021 0.2  0.0 - 3.0 % Final   Neutro Abs 12/24/2021 2.5  1.4 - 7.7 K/uL Final   Lymphs Abs 12/24/2021 1.1  0.7 - 4.0 K/uL Final   Monocytes Absolute 12/24/2021 0.4  0.1 - 1.0 K/uL Final   Eosinophils Absolute 12/24/2021 0.0  0.0 - 0.7 K/uL Final   Basophils Absolute 12/24/2021 0.0  0.0 - 0.1 K/uL Final   Source: 12/24/2021 URINE   Final   Status: 12/24/2021 FINAL   Final   Isolate 1: 12/24/2021    Final                   Value:Mixed genital flora isolated. These superficial bacteria are not indicative of a urinary tract infection. No further organism identification is warranted on this specimen. If clinically indicated, recollect clean-catch, mid-stream urine and transfer  immediately to Urine Culture Transport Tube.     Allergies: Patient has no known allergies.  PTA Medications: (Not in a hospital admission)   Medical Decision Making  Karmina Zufall was admitted to [facility] continuous assessment unit/Facility base crisis unit under the service of No att. providers found for Schizophrenia, unspecified ([place]), crisis management, and stabilization. Routine labs ordered, which include  Lab Orders         Resp Panel by  RT-PCR (Flu A&B, Covid) Anterior Nasal Swab         CBC with Differential/Platelet         Comprehensive metabolic panel         Hemoglobin A1c         Magnesium         Ethanol         Lipid panel         TSH         Prolactin         Urinalysis, Routine w reflex microscopic Urine, Clean Catch         POCT Urine Drug Screen - (I-Screen)         POC SARS Coronavirus 2 Ag    Medication Management: Medications started Meds ordered this encounter  Medications   acetaminophen (TYLENOL) tablet 650 mg   alum & mag hydroxide-simeth (MAALOX/MYLANTA) 200-200-20 MG/5ML suspension 30 mL   magnesium hydroxide (MILK OF MAGNESIA) suspension 30 mL   hydrOXYzine (ATARAX) tablet 25 mg   traZODone (DESYREL) tablet 50 mg   DISCONTD: OLANZapine zydis (ZYPREXA) disintegrating tablet 5 mg   risperiDONE (RISPERDAL M-TABS) disintegrating tablet 1 mg   benztropine (COGENTIN) tablet 0.5 mg   Started the Risperidone (outpatient provider can switch over to Perseris)  Will maintain continuous observation for safety.   Social work will consult with family for  collateral information and discuss discharge and follow up plan.    Recommendations  Based on my evaluation the patient does not appear to have an emergency medical condition.  Zacharey Jensen, NP 06/07/22  4:37 PM

## 2022-06-07 NOTE — ED Notes (Signed)
Patient resting quietly in bed with eyes closed, Respirations equal and unlabored, skin warm and dry, NAD. Routine safety checks conducted according to facility protocol. Will continue to monitor for safety. 

## 2022-06-07 NOTE — Progress Notes (Signed)
[facility] called the client's social [place], [place], [phone].    [facility]alla left a HIPAA compliant VM stating the client will stay overnight for obs and will be discharged tomorrow morning.  [facility] asked for the Social Worker to give [facility] a call back to discuss further details.   Drema Balzarine [facility]
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
## 2022-06-07 NOTE — BH Assessment (Signed)
Cephus Shelling. [place], Urgent; 51 year old presents to [facility], involuntarily and unaccompanied.  Pt IVC reads "respondent has been diagnosed with schizophrenia and metastatic".  Pt denies SI, HI or AVH.  Pt guardian, [name] DSS, [phone] have been agitated and aggression for a month and hypo religious. Also reported have not taken medication.  Pt guardian reports she have been stating "desmons are in my room".  Pt admits to [place] diagnosis or prescribed medication for symptom management.  Pt signed MSE.

## 2022-06-07 NOTE — ED Notes (Signed)
Patient present to [facility] under IVC A&Ox3. Patient reports she is not really sure why she was brought here "[place]). Patient denies SI/HI and AVH. Patient denies any physical complaints when asked. Patient walks with 3 prong cane. Providers aware upon admission. No acute distress noted. Support and encouragement provided. Routine safety checks conducted according to facility protocol. Encouraged patient to notify staff if thoughts of harm toward self or others arise. Patient verbalize understanding and agreement. Will continue to monitor for safety.

## 2022-06-08 ENCOUNTER — Encounter (HOSPITAL_COMMUNITY): Payer: Self-pay | Admitting: Registered Nurse

## 2022-06-08 DIAGNOSIS — F209 Schizophrenia, unspecified: Secondary | ICD-10-CM

## 2022-06-08 DIAGNOSIS — Z046 Encounter for general psychiatric examination, requested by authority: Secondary | ICD-10-CM | POA: Diagnosis not present

## 2022-06-08 NOTE — ED Provider Notes (Cosign Needed Addendum)
FBC/OBS ASAP Discharge Summary  Date and Time: 06/08/2022 10:05 AM  Name: Becci Batty  MRN:  [id number]   Discharge Diagnoses:  Final diagnoses:  Schizophrenia, unspecified type [facility])  Involuntary commitment   HPI:  Lindalou Soltis 51 y.o., female patient presented to [facility] via [facility] under involuntary commitment petition by counselor Barnabas Lister Bolick ([facility] ACTT).  Per IVC:  "Respondent has been diagnosed with schizophrenia and metastatic melanoma.  She reports demons in her room.  She asked hotel staff to switch her room due to demons.  Hotel staff reports Aeris has become very hostile over the past few days.  She believes people are coming into her room taking her stuff.  Respondent has attempted to break into other hotel rooms at 2:30 in the morning and yelling outside the door about demons.  She has taken a taxi twice and told the taxi driver that the hotel will pay for it.  Rainy has stopped her meds on March 27, 2022.  Since stopping he exhibits paranoia, hyper-religious delusional thinking, and mood labial."   Subjective: "You people, I said no (referring to SI, HI, AVH).   [name], 51 y.o., female patient seen face to face by this provider, consulted with Dr. Hampton Abbot; and chart reviewed on 06/08/22.  On evaluation Jonie Burdell reports she feels fine and is ready to go home.  Ask how she will be getting back home this morning.  Patient asked if she was having any suicidal or homicidal thoughts "No."  Patient then asked about auditory and visual hallucinations and she states "you people.  I said no.  I am not a mental patient.  Nothing is wrong with me."  Patient goes on to say that she is a child of [name]"  Patient states she refused medication last night "I said I don't need medication."  Attempted to encourage patient to take Haldol and she states "I'm not taking it.  I don't need it."  Patient states that she slept through the night  but wasn't comfortable related to the bed sleeping in.  Reports she ate with no problem.     During evaluation [name] is lying in bed with no noted dis tress.  She is alert, oriented x 4, calm, cooperative and attentive.  Her mood is euthymic. But starts to get irritated with questing.  Mood is congruent affect.  She has normal speech, and behavior.  There were no behavioral outburst while on continuous assessment unit and patient has done nothing that would suggest that she was a danger to her self or other.  Patient is currently at her baseline with hyper religious delusions but has not interfered with daily activities. Objectively there is no evidence of auditory/visual hallucinations or mania.  Patient is able to converse coherently, with no distractibility, or pre-occupation.  She denies suicidal/self-harm/homicidal ideation, psychosis, and paranoia.   Patient currently has ACTT services with [facility]  IVC Rescinded:  After overnight continuous observation, thorough evaluation, and review of information currently presented on assessment of Rylyn Zawistowski (respondent), there is insufficient findings to indicate respondent meets criteria for involuntary commitment or require an inpatient level of care.  Respondent is alert/oriented x 4; calm/cooperative; and mood congruent with affect.  Respondent is speaking in a clear tone at moderate volume, and normal pace, with good eye contact.  Respondents' thought process is coherent and relevant.  Respondent does present with hyper religious delusional thinking which is her baseline and  does not interfere with her daily activities.  There is no indication that the respondent is currently responding auditory or visual hallucinations.  Respondent has denied suicidal/self-harm/homicidal ideation, psychosis, and paranoia.  Respondent has remained calm throughout assessment and during her stay in urgent care.  There has been no behavior that has  demonstrated that respondent is a danger to herself or others.  Respondent has answered questions appropriately.  Currently respondent is not significantly impaired, manic and at her baseline with delusional thinking.  A detailed risk assessment has been completed based on clinical exam and individual risk factors.  There is no evidence of imminent risk to self or others at present and respondent does not meet criteria for psychiatric inpatient admission.  Stay Summary: Teaira Croft was admitted for Schizophrenia, unspecified [facility]), crisis management, safety, and stabilization.  Medical problems were identified and treated as needed.  Home medications were restarted, adjusted, or new medications added as needed or appropriate.  Medications treated with during admission are as follows.  benztropine  0.5 mg Oral BID   risperiDONE  1 mg Oral BID    Labs ordered for review: Lab Orders         Resp Panel by RT-PCR (Flu A&B, Covid) Anterior Nasal Swab         CBC with Differential/Platelet         Comprehensive metabolic panel         Hemoglobin A1c         Magnesium         Ethanol         Lipid panel         TSH         Prolactin         Urinalysis, Routine w reflex microscopic Urine, Clean Catch         POCT Urine Drug Screen - (I-Screen)         POC SARS Coronavirus 2 Ag     Improvement was monitored by observation and [name] 's verbal report emotional status and symptom reduction along with clinical staff report.  [name] was evaluated by the treatment team for stability and plans for continued recovery upon discharge. [name] 's motivation was an integral factor for scheduling further treatment. Employment, transportation, bed availability, health status, family support, and any pending legal issues were also considered during stay. She was offered further treatment options upon discharge including but not limited to Residential, Intensive  Outpatient, and Outpatient treatment.   Upon completion of this admission Neddie Steedman was both mentally and medically stable for discharge denying suicidal/homicidal ideation, auditory/visual/tactile hallucinations, delusional thoughts, and paranoia.    [name] will follow up with  Follow-up [place]. Call .   Why: Follow up with your ACTT for medication management and counseling Contact information: [place] [address] [phone]                Total Time spent with patient: 45 minutes  Past Psychiatric History: Schizophrenia Past Medical History:  Past Medical History:  Diagnosis Date   Allergy    Anemia    Gallstones    GERD (gastroesophageal reflux disease)    Hemorrhoids    melanoma with met dz dx'd 01/2018   brain and adrenal   Seasonal allergies    Stroke [facility])     Past Surgical History:  Procedure Laterality Date   BIOPSY  01/28/2018   Procedure: BIOPSY;  Surgeon: Laurence Spates, MD;  Location: [facility] ENDOSCOPY;  Service: Endoscopy;;   BREAST BIOPSY Left    ESOPHAGOGASTRODUODENOSCOPY N/A 01/28/2018   Procedure: ESOPHAGOGASTRODUODENOSCOPY (EGD);  Surgeon: Laurence Spates, MD;  Location: [facility];  Service: Endoscopy;  Laterality: N/A;   MOUTH SURGERY     OVARY SURGERY     removed "something"   Family History:  Family History  Problem Relation Age of Onset   Melanoma Mother    Hypertension Father    Breast cancer Maternal Aunt    Breast cancer Paternal Aunt    Diabetes Paternal Aunt    Breast cancer Maternal Grandmother    Breast cancer Paternal Grandmother    Diabetes Paternal Grandmother    Colon cancer Neg Hx    Esophageal cancer Neg Hx    Pancreatic cancer Neg Hx    Stomach cancer Neg Hx    Liver disease Neg Hx    Rectal cancer Neg Hx    Family Psychiatric History: None reported Social History:  Social History   Substance and Sexual Activity  Alcohol Use No      Social History   Substance and Sexual Activity  Drug Use No    Social History   Socioeconomic History   Marital status: Single    Spouse name: Not on file   Number of children: Not on file   Years of education: Not on file   Highest education level: Not on file  Occupational History   Not on file  Tobacco Use   Smoking status: Never   Smokeless tobacco: Never  Vaping Use   Vaping Use: Never used  Substance and Sexual Activity   Alcohol use: No   Drug use: No   Sexual activity: Not Currently  Other Topics Concern   Not on file  Social History Narrative   Not on file   Social Determinants of Health   Financial Resource Strain: Not on file  Food Insecurity: Food Insecurity Present (04/09/2022)   Hunger Vital Sign    Worried About Running Out of Food in the Last Year: Sometimes true    Ran Out of Food in the Last Year: Sometimes true  Transportation Needs: Unmet Transportation Needs (04/09/2022)   PRAPARE - Hydrologist (Medical): Yes    Lack of Transportation (Non-Medical): Yes  Physical Activity: Not on file  Stress: Not on file  Social Connections: Not on file   SDOH:  [place]: Food Insecurity Present (04/09/2022)  Housing: High Risk (04/09/2022)  Transportation Needs: Unmet Transportation Needs (04/09/2022)  Utilities: Not At Risk (04/09/2022)  Alcohol Screen: Low Risk  (06/27/2021)  Depression (PHQ2-9): Low Risk  (06/07/2022)  Tobacco Use: Low Risk  (06/08/2022)    Tobacco Cessation:  N/A, patient does not currently use tobacco products  Current Medications:  Current Facility-Administered Medications  Medication Dose Route Frequency Provider Last Rate Last Admin   acetaminophen (TYLENOL) tablet 650 mg  650 mg Oral Q6H PRN Myleen Brailsford B, NP       alum & mag hydroxide-simeth (MAALOX/MYLANTA) 200-200-20 MG/5ML suspension 30 mL  30 mL Oral Q4H PRN Exilda Wilhite B, NP       benztropine (COGENTIN) tablet  0.5 mg  0.5 mg Oral BID Demon Volante B, NP   0.5 mg at 06/08/22 0913   hydrOXYzine (ATARAX) tablet 25 mg  25 mg Oral TID PRN Mayzie Caughlin B, NP  magnesium hydroxide (MILK OF MAGNESIA) suspension 30 mL  30 mL Oral Daily PRN Edynn Gillock B, NP       risperiDONE (RISPERDAL M-TABS) disintegrating tablet 1 mg  1 mg Oral BID Zechariah Bissonnette B, NP       traZODone (DESYREL) tablet 50 mg  50 mg Oral QHS PRN Lesli Issa B, NP       Current Outpatient Medications  Medication Sig Dispense Refill   fluPHENAZine decanoate (PROLIXIN) 25 MG/ML injection Inject 25 mg into the muscle every 14 (fourteen) days.     linaclotide (LINZESS) 145 MCG CAPS capsule Take 1 capsule (145 mcg total) by mouth in the morning. 30 capsule 11   meloxicam (MOBIC) 15 MG tablet Take 1 tablet (15 mg total) by mouth daily. 7 tablet 0   omeprazole (PRILOSEC) 20 MG capsule Take 1 capsule (20 mg total) by mouth in the morning. 30 capsule 11   rosuvastatin (CRESTOR) 40 MG tablet Take 1 tablet (40 mg total) by mouth daily. 30 tablet 3    PTA Medications: (Not in a hospital admission)      06/07/2022    4:18 PM 05/20/2022   11:16 AM 12/24/2021    1:33 PM  Depression screen PHQ 2/9  Decreased Interest 0 0 0  Down, Depressed, Hopeless 0 0 0  PHQ - 2 Score 0 0 0  Altered sleeping 0    Tired, decreased energy 1    Change in appetite 0    Feeling bad or failure about yourself  0    Trouble concentrating 1    Moving slowly or fidgety/restless 2    Suicidal thoughts 0    PHQ-9 Score 4    Difficult doing work/chores Not difficult at all      [place] ED from 06/07/2022 in [facility] ED from 05/25/2022 in [facility] ED from 05/08/2022 in [place] No Risk No Risk No Risk       Musculoskeletal  Strength & Muscle Tone: within normal limits Gait & Station:  Uses a cane to assist with  ambulation Patient leans: N/A  Psychiatric Specialty Exam  Presentation  General Appearance:  Appropriate for Environment  Eye Contact: Good  Speech: Clear and Coherent; Normal Rate  Speech Volume: Normal  Handedness: Right   Mood and Affect  Mood: Euthymic  Affect: Congruent   Thought Process  Thought Processes: Coherent; Linear  Descriptions of Associations:Intact  Orientation:Full (Time, Place and Person)  Thought Content:Logical  Diagnosis of Schizophrenia or Schizoaffective disorder in past: Yes  Duration of Psychotic Symptoms: Greater than six months   Hallucinations:Hallucinations: None  Ideas of Reference:Delusions (hyper religious chronic delusional thoughts of being a child of [name] and other that are not a child of [name] are demonic, but doesn't let it interfere with daily activities)  Suicidal Thoughts:Suicidal Thoughts: No  Homicidal Thoughts:Homicidal Thoughts: No   Sensorium  Memory: Immediate Fair; Recent Fair  Judgment: Fair  Insight: Present; Fair   Community education officer  Concentration: Fair  Attention Span: Fair  Recall: [employer] of Knowledge: Fair  Language: Good   Psychomotor Activity  Psychomotor Activity: Psychomotor Activity: Normal   Assets  Assets: Communication Skills; Desire for Improvement; Financial Resources/Insurance; Housing; Resilience; Social Support   Sleep  Sleep: Sleep: Good   Nutritional Assessment (For OBS and FBC admissions only) Has the patient had a weight loss or gain of 10 pounds or more in  the last 3 months?: No Has the patient had a decrease in food intake/or appetite?: No Does the patient have dental problems?: No Does the patient have eating habits or behaviors that may be indicators of an eating disorder including binging or inducing vomiting?: No Has the patient recently lost weight without trying?: 0 Has the patient been eating poorly because of a decreased appetite?:  0 Malnutrition Screening Tool Score: 0    Physical Exam  Physical Exam Vitals and nursing note reviewed.  Constitutional:      General: She is not in acute distress.    Appearance: She is well-developed.  HENT:     Head: Normocephalic and atraumatic.  Eyes:     Conjunctiva/sclera: Conjunctivae normal.  Cardiovascular:     Rate and Rhythm: Normal rate and regular rhythm.     Heart sounds: No murmur heard. Pulmonary:     Effort: Pulmonary effort is normal. No respiratory distress.     Breath sounds: Normal breath sounds.  Abdominal:     Palpations: Abdomen is soft.     Tenderness: There is no abdominal tenderness.  Musculoskeletal:        General: No swelling.     Cervical back: Neck supple.  Skin:    General: Skin is warm and dry.     Capillary Refill: Capillary refill takes less than 2 seconds.  Neurological:     Mental Status: She is alert.  Psychiatric:        Attention and Perception: Perception normal. She does not perceive auditory or visual hallucinations.        Mood and Affect: Mood and affect normal.        Behavior: Behavior normal. Behavior is cooperative.        Thought Content: Thought content is delusional. Thought content does not include homicidal or suicidal ideation. Thought content does not include suicidal plan.        Judgment: Judgment is impulsive.    Review of Systems  Constitutional: Negative.   HENT: Negative.    Respiratory:  Negative for cough, shortness of breath and wheezing.   Cardiovascular:  Negative for chest pain.  Musculoskeletal:  Positive for joint pain and myalgias.  Psychiatric/Behavioral:  Depression: Stablae. Hallucinations: Denies. Substance abuse: Denies. Suicidal ideas: Denies. The patient is nervous/anxious.    Blood pressure 108/69, pulse 72, temperature 98.5 F (36.9 C), temperature source Oral, resp. rate 18, SpO2 96 %. There is no height or weight on file to calculate BMI.  Demographic Factors:  Caucasian and Living  alone  Loss Factors: NA  Historical Factors: Impulsivity  Risk Reduction Factors:   Religious beliefs about death, Positive social support, and Positive therapeutic relationship  Continued Clinical Symptoms:  Schizophrenia:   Paranoid or undifferentiated type Previous Psychiatric Diagnoses and Treatments  Cognitive Features That Contribute To Risk:  None    Suicide Risk:  Minimal: No identifiable suicidal ideation.  Patients presenting with no risk factors but with morbid ruminations; may be classified as minimal risk based on the severity of the depressive symptoms  Plan Of Care/Follow-up recommendations:  Other:  Follow up with ACTT ([facility])  Disposition: No evidence of imminent risk to self or others at present.   Patient does not meet criteria for psychiatric inpatient admission. Supportive therapy provided about ongoing stressors. Discussed crisis plan, support from social network, calling 911, coming to the Emergency Department, and calling Suicide Hotline.   Attempted to contact DSS guardian twice and left HIPAA compliant message that patient has  been psychiatrically cleared and ready for discharge.  Will refer to social work/TOC.  Called [facility] twice this morning also and unable to speak with anyone.  Left voice message that spoke with Abby, RN last night who informed would pick patient up this morning after discharge.  Left phone number for return call.  Will refer to social work/TOC.    Addendum:  [name] (DSS legal guardian) returned called.  [name] states she has several missed call and wanted to know what the calls were about.  [name] informed that patient has been psychiatrically cleared for discharge and the calls were to arrange transport for the patient to return home.  [name] states "What have ya'll done for her.  Do ya'll have any concerns about her spraying Lysol at people because they smoke, that she is banging on doors at 12:00 in the  morning looking for her sister.  I don't feel that she is safe and you're saying that she did not take her medication while she was there."  Informed [name] that patient has been observed overnight and at no time has patient had any behavioral outburst or done anything that was a danger to herself or anyone else while on continuous assessment unit.  Also informed that patient is hyper religious which is her baseline.  Informed that there was no indication that patient was responding to auditory or visual hallucinations.  Reported that patient was able to converse appropriately with staff.  Informed patient was encouraged to take medication but refused and could not force patient to take any medication if she did not want to.  Informed the patient is oriented alert.  Also informed that spoke with Abby, RN at [facility] who states that patient hyper religious is baseline.  Reported that Abby stated that there have been some changes as far as patient not being as engaging and that patient is refusing to take her medications but without patient being psychotic to the point that she is unable to make any decisions and with patient being oriented and able to converse appropriately her thoughts and feelings that medications could not be forced on someone who did not want.  Also informed [name] that spoke with Jenny Reichmann the Freight forwarder at [facility] who informed that there have been some incidents related to patient wanting to change her room because she had smokers on each side of her, that patient did bang on the door 1 night, and that patient has taken a taxi and return without being able to pay in the motel had to pay.  But the main concern was that the patient was unsteady and staff had noticed her going up the stairs and felt that was unsafe for her.  Also that send the voice that she felt that patient needed to be in a assisted living or skilled nursing care.  Inform [name] that we were here to assess  psychiatric stability and did not do placement into long-term facilities.  Informed that patient does have ACTT services to assist with medication management and prevent frequent hospitalizations.  Also informed that DSS could assist with patient's long-term placement to appropriate facility.  [name] was asked if she was coming to pick patient up and she states "No, I am not coming to pick her up I do not feel that is unsafe for their and I do not feel like I have done anything for her."  [name] asked if she was going to call [facility]  to come pick up patient and she states "No I am not calling anyone to come pick her up if you want psi to come pick her up you can call yourself."  [name] then informed that we will call [facility] to come pick patient up.  Social worker did contact [facility] who states that they had already discussed patient this morning and was scheduled to have a visit with her, and would come pick her up.    Jazlene Bares, NP 06/08/2022, 10:05 AM

## 2022-06-08 NOTE — Progress Notes (Signed)
LCSW Progress Note  1006 - Left voicemail for [facility]., [phone], requesting a call back.  1010 - Left voicemail for [name], [place] legal guardian, [phone], requesting a call back.  1033 - Contacted [facility] again and spoke with [facility].  Chelsea stated Chrissy would be here at 1130 to pick up the pt.   Omelia Blackwater, MSW, [place] phone [phone] fax

## 2022-06-08 NOTE — ED Notes (Signed)
Pt refused Risperdal, accepted Cogentin. Pt stated, "I am a child of [name]" when explaining her meds to her. Pt denied AVH, SI or HI to this nurse. Safety maintained and will continue to monitor.

## 2022-06-08 NOTE — ED Notes (Signed)
Pt is sleeping. No distress noted. Will continue to monitor safety. 

## 2022-06-08 NOTE — ED Notes (Signed)
Discharge instructions provided and Pt refused to accept the AVS. Pt also refused to allow person picking her up to have the AVS. Pt alert, orient and ambulatory prior to d/c from facility. Personal belongings returned from locker number 15. Pt given the opportunity to change clothes while in locker room. Safety maintained.

## 2022-06-09 ENCOUNTER — Telehealth: Payer: Self-pay | Admitting: Oncology

## 2022-06-09 LAB — HEMOGLOBIN A1C
Hgb A1c MFr Bld: 5.7 % — ABNORMAL HIGH (ref 4.8–5.6)
Mean Plasma Glucose: 117 mg/dL

## 2022-06-09 LAB — PROLACTIN: Prolactin: 65 ng/mL — ABNORMAL HIGH (ref 4.8–23.3)

## 2022-06-09 NOTE — Telephone Encounter (Signed)
Attempted to reach patient per dr. Alen Blew transition. Left voicemail for patient to call us back .

## 2022-06-10 ENCOUNTER — Other Ambulatory Visit: Payer: Self-pay

## 2022-06-10 ENCOUNTER — Emergency Department (HOSPITAL_COMMUNITY): Payer: Medicare Other

## 2022-06-10 ENCOUNTER — Emergency Department (HOSPITAL_COMMUNITY)
Admission: EM | Admit: 2022-06-10 | Discharge: 2022-06-15 | Disposition: A | Discharge status: Home / Self Care | Payer: Medicare Other | Attending: Medical | Admitting: Medical

## 2022-06-10 DIAGNOSIS — F209 Schizophrenia, unspecified: Secondary | ICD-10-CM | POA: Diagnosis present

## 2022-06-10 DIAGNOSIS — R259 Unspecified abnormal involuntary movements: Secondary | ICD-10-CM | POA: Diagnosis not present

## 2022-06-10 DIAGNOSIS — F203 Undifferentiated schizophrenia: Secondary | ICD-10-CM | POA: Diagnosis present

## 2022-06-10 DIAGNOSIS — Z046 Encounter for general psychiatric examination, requested by authority: Secondary | ICD-10-CM | POA: Insufficient documentation

## 2022-06-10 DIAGNOSIS — R443 Hallucinations, unspecified: Secondary | ICD-10-CM | POA: Insufficient documentation

## 2022-06-10 DIAGNOSIS — F200 Paranoid schizophrenia: Secondary | ICD-10-CM

## 2022-06-10 DIAGNOSIS — Z1152 Encounter for screening for COVID-19: Secondary | ICD-10-CM | POA: Insufficient documentation

## 2022-06-10 LAB — COMPREHENSIVE METABOLIC PANEL
ALT: 24 U/L (ref 0–44)
AST: 36 U/L (ref 15–41)
Albumin: 4.9 g/dL (ref 3.5–5.0)
Alkaline Phosphatase: 95 U/L (ref 38–126)
Anion gap: 12 (ref 5–15)
BUN: 19 mg/dL (ref 6–20)
CO2: 19 mmol/L — ABNORMAL LOW (ref 22–32)
Calcium: 9.7 mg/dL (ref 8.9–10.3)
Chloride: 105 mmol/L (ref 98–111)
Creatinine, Ser: 0.64 mg/dL (ref 0.44–1.00)
GFR, Estimated: >60 mL/min (ref >60)
Glucose, Bld: 121 mg/dL — ABNORMAL HIGH (ref 70–99)
Potassium: 3.5 mmol/L (ref 3.5–5.1)
Sodium: 136 mmol/L (ref 135–145)
Total Bilirubin: 0.9 mg/dL (ref 0.3–1.2)
Total Protein: 8.7 g/dL — ABNORMAL HIGH (ref 6.5–8.1)

## 2022-06-10 LAB — CBC WITH DIFFERENTIAL/PLATELET
Abs Immature Granulocytes: 0.01 10*3/uL (ref 0.00–0.07)
Basophils Absolute: 0 10*3/uL (ref 0.0–0.1)
Basophils Relative: 0 %
Eosinophils Absolute: 0 10*3/uL (ref 0.0–0.5)
Eosinophils Relative: 0 %
HCT: 44.2 % (ref 36.0–46.0)
Hemoglobin: 14.4 g/dL (ref 12.0–15.0)
Immature Granulocytes: 0 %
Lymphocytes Relative: 27 %
Lymphs Abs: 1.2 10*3/uL (ref 0.7–4.0)
MCH: 27.1 pg (ref 26.0–34.0)
MCHC: 32.6 g/dL (ref 30.0–36.0)
MCV: 83.2 fL (ref 80.0–100.0)
Monocytes Absolute: 0.5 10*3/uL (ref 0.1–1.0)
Monocytes Relative: 11 %
Neutro Abs: 2.6 10*3/uL (ref 1.7–7.7)
Neutrophils Relative %: 62 %
Platelets: 79 10*3/uL — ABNORMAL LOW (ref 150–400)
RBC: 5.31 MIL/uL — ABNORMAL HIGH (ref 3.87–5.11)
RDW: 12.6 % (ref 11.5–15.5)
WBC: 4.3 10*3/uL (ref 4.0–10.5)
nRBC: 0 % (ref 0.0–0.2)

## 2022-06-10 LAB — URINALYSIS, ROUTINE W REFLEX MICROSCOPIC
Bilirubin Urine: NEGATIVE
Glucose, UA: NEGATIVE mg/dL
Hgb urine dipstick: NEGATIVE
Ketones, ur: 20 mg/dL — AB
Leukocytes,Ua: NEGATIVE
Nitrite: NEGATIVE
Protein, ur: 100 mg/dL — AB
Specific Gravity, Urine: 1.02 (ref 1.005–1.030)
pH: 5 (ref 5.0–8.0)

## 2022-06-10 LAB — ETHANOL: Alcohol, Ethyl (B): <10 mg/dL (ref <10)

## 2022-06-10 LAB — AMMONIA: Ammonia: 17 umol/L (ref 9–35)

## 2022-06-10 LAB — RAPID URINE DRUG SCREEN, HOSP PERFORMED
Amphetamines: NOT DETECTED
Barbiturates: NOT DETECTED
Benzodiazepines: NOT DETECTED
Cocaine: NOT DETECTED
Opiates: NOT DETECTED
Tetrahydrocannabinol: NOT DETECTED

## 2022-06-10 LAB — SARS CORONAVIRUS 2 BY RT PCR: SARS Coronavirus 2 by RT PCR: NEGATIVE

## 2022-06-10 MED ORDER — FLUPHENAZINE DECANOATE 25 MG/ML IJ SOLN
25.0000 mg | INTRAMUSCULAR | Status: DC
  Administered 2022-06-10: 25 mg via INTRAMUSCULAR
  Filled 2022-06-10: qty 1

## 2022-06-10 MED ORDER — HALOPERIDOL LACTATE 5 MG/ML IJ SOLN
5.0000 mg | Freq: Once | INTRAMUSCULAR | Status: AC | PRN
  Administered 2022-06-10: 5 mg via INTRAMUSCULAR

## 2022-06-10 MED ORDER — HALOPERIDOL LACTATE 5 MG/ML IJ SOLN
5.0000 mg | Freq: Once | INTRAMUSCULAR | Status: DC
  Filled 2022-06-10: qty 1

## 2022-06-10 MED ORDER — DIPHENHYDRAMINE HCL 50 MG/ML IJ SOLN
25.0000 mg | Freq: Once | INTRAMUSCULAR | Status: DC
  Filled 2022-06-10: qty 1

## 2022-06-10 MED ORDER — LORAZEPAM 2 MG/ML IJ SOLN
1.0000 mg | Freq: Once | INTRAMUSCULAR | Status: DC
  Filled 2022-06-10 (×2): qty 1

## 2022-06-10 MED ORDER — PANTOPRAZOLE SODIUM 40 MG PO TBEC
40.0000 mg | DELAYED_RELEASE_TABLET | Freq: Every day | ORAL | Status: DC
  Filled 2022-06-10 (×2): qty 1

## 2022-06-10 MED ORDER — DIPHENHYDRAMINE HCL 50 MG/ML IJ SOLN
25.0000 mg | Freq: Once | INTRAMUSCULAR | Status: AC | PRN
  Administered 2022-06-10: 25 mg via INTRAMUSCULAR

## 2022-06-10 MED ORDER — LORAZEPAM 1 MG PO TABS: 1.0000 mg | ORAL_TABLET | Freq: Once | ORAL | Status: DC

## 2022-06-10 MED ORDER — LINACLOTIDE 145 MCG PO CAPS
145.0000 ug | ORAL_CAPSULE | Freq: Every day | ORAL | Status: DC
  Administered 2022-06-13: 145 ug via ORAL
  Filled 2022-06-10 (×5): qty 1

## 2022-06-10 MED ORDER — ROSUVASTATIN CALCIUM 20 MG PO TABS
40.0000 mg | ORAL_TABLET | Freq: Every day | ORAL | Status: DC
  Administered 2022-06-12: 40 mg via ORAL
  Filled 2022-06-10 (×5): qty 2

## 2022-06-10 MED ORDER — OLANZAPINE 10 MG PO TABS: 10.0000 mg | ORAL_TABLET | Freq: Every day | ORAL | Status: DC

## 2022-06-10 MED ORDER — HALOPERIDOL 5 MG PO TABS: 5.0000 mg | ORAL_TABLET | Freq: Once | ORAL | Status: AC | PRN

## 2022-06-10 MED ORDER — DIPHENHYDRAMINE HCL 25 MG PO CAPS: 25.0000 mg | ORAL_CAPSULE | Freq: Once | ORAL | Status: AC | PRN

## 2022-06-10 MED ORDER — STERILE WATER FOR INJECTION IJ SOLN
INTRAMUSCULAR | Status: AC
  Filled 2022-06-10: qty 10

## 2022-06-10 NOTE — Progress Notes (Signed)
Patient has been denied by [facility] due to no appropriate beds available. Patient meets [place] inpatient criteria per Lindon Romp, NP. Patient has been faxed out to the following facilities:    [facility]  [address] [place] [address] [phone] [place] [address] [phone]  [facility]  [address] [place] [address] [phone] [place]  [address], [place] [address] [phone]  [facility]  [address], [place] [address] [phone] [place]  [address] [place] [address] [phone]  [facility]  [address] [place] [address] [phone] [facility]  [address], [place] [address] [phone]  [facility]  [address] [place], [place] [address] [phone]  [facility]  [phone] [place] [address] [phone] [facility]  [address] [place] [address] [phone] [facility]  [address] [place] [address] [phone]  [facility]  [address] [place] [address] [place]  [facility]  [address] [place] [address] [phone] [place], MSW, LCSW-A  9:56 PM 06/10/2022

## 2022-06-10 NOTE — ED Provider Notes (Signed)
I provided a substantive portion of the care of this patient.  I personally performed the entirety of the medical decision making for this encounter.  EKG Interpretation  Date/Time:  Thursday June 10 2022 11:34:50 EST Ventricular Rate:  88 PR Interval:  143 QRS Duration: 100 QT Interval:  370 QTC Calculation: 448 R Axis:   103 Text Interpretation: Sinus rhythm Right axis deviation ST elev, probable normal early repol pattern No significant change since last tracing Confirmed by Lacretia Leigh ([id number]) on 06/10/2022 11:53:54 AM   Patient is EKG per my interpretation shows sinus tachycardia.  Patient has required chemical restraint with Geodon and Ativan.  Will consult psychiatry   Lacretia Leigh, MD 06/10/22 1155

## 2022-06-10 NOTE — ED Provider Notes (Signed)
[facility] DEPT Provider Note   CSN: [id number] Arrival date & time: 06/10/22  1033     History  Chief Complaint  Patient presents with   IVC    Kaitlyn Duncan is a 51 y.o. female, who presents to the ED after being found face down on the ground in a parking lot. Keep screaming "I'm a child of god. You all are the devils." Hx of schizophrenia per chart. Brought in on IVC from motel/hotel.    Home Medications Prior to Admission medications   Medication Sig Start Date End Date Taking? Authorizing Provider  fluPHENAZine decanoate (PROLIXIN) 25 MG/ML injection Inject 25 mg into the muscle every 14 (fourteen) days. 06/02/22   [provider]  linaclotide (LINZESS) 145 MCG CAPS capsule Take 1 capsule (145 mcg total) by mouth in the morning. 05/12/22 05/07/23  Levin Erp, PA  meloxicam (MOBIC) 15 MG tablet Take 1 tablet (15 mg total) by mouth daily. 05/25/22   Orpah Greek, MD  omeprazole (PRILOSEC) 20 MG capsule Take 1 capsule (20 mg total) by mouth in the morning. 05/12/22   Levin Erp, PA  rosuvastatin (CRESTOR) 40 MG tablet Take 1 tablet (40 mg total) by mouth daily. 04/13/22   Little Ishikawa, MD      Allergies    Patient has no known allergies.    Review of Systems   Review of Systems  Psychiatric/Behavioral:  Positive for hallucinations. Negative for suicidal ideas.     Physical Exam Updated Vital Signs BP (!) 124/90   Pulse 90   Temp 98.4 F (36.9 C) (Oral)   Resp (!) 33   Ht 5' (1.524 m)   Wt 63 kg   SpO2 99%   BMI 27.13 kg/m  Physical Exam Vitals and nursing note reviewed.  Constitutional:      General: She is not in acute distress.    Appearance: She is well-developed. She is not toxic-appearing.  HENT:     Head: Normocephalic and atraumatic.  Eyes:     Conjunctiva/sclera: Conjunctivae normal.  Cardiovascular:     Rate and Rhythm: Normal rate and regular rhythm.     Heart  sounds: No murmur heard. Pulmonary:     Effort: Pulmonary effort is normal. No respiratory distress.     Breath sounds: Normal breath sounds.  Abdominal:     Palpations: Abdomen is soft.     Tenderness: There is no abdominal tenderness.  Musculoskeletal:        General: No swelling.     Cervical back: Neck supple.  Skin:    General: Skin is warm and dry.     Capillary Refill: Capillary refill takes less than 2 seconds.  Neurological:     General: No focal deficit present.     Mental Status: She is alert.  Psychiatric:        Mood and Affect: Affect is labile and angry.        Behavior: Behavior is uncooperative.        Thought Content: Thought content is paranoid.     ED Results / Procedures / Treatments   Labs (all labs ordered are listed, but only abnormal results are displayed) Labs Reviewed  COMPREHENSIVE METABOLIC PANEL - Abnormal; Notable for the following components:      Result Value   CO2 19 (*)    Glucose, Bld 121 (*)    Total Protein 8.7 (*)    All other components within normal limits  CBC WITH DIFFERENTIAL/PLATELET - Abnormal; Notable for the following components:   RBC 5.31 (*)    Platelets 79 (*)    All other components within normal limits  URINALYSIS, ROUTINE W REFLEX MICROSCOPIC - Abnormal; Notable for the following components:   Ketones, ur 20 (*)    Protein, ur 100 (*)    Bacteria, UA RARE (*)    All other components within normal limits  SARS CORONAVIRUS 2 BY RT PCR  ETHANOL  RAPID URINE DRUG SCREEN, HOSP PERFORMED  AMMONIA  I-STAT BETA HCG BLOOD, ED ([facility], [facility], [facility] ONLY)    EKG EKG Interpretation  Date/Time:  Thursday June 10 2022 11:34:50 EST Ventricular Rate:  88 PR Interval:  143 QRS Duration: 100 QT Interval:  370 QTC Calculation: 448 R Axis:   103 Text Interpretation: Sinus rhythm Right axis deviation ST elev, probable normal early repol pattern No significant change since last tracing Confirmed by Lacretia Leigh ([id number]) on  06/10/2022 11:53:54 AM  Radiology CT Head Wo Contrast  Result Date: 06/10/2022 CLINICAL DATA:  Mental status changes of unknown cause EXAM: CT HEAD WITHOUT CONTRAST TECHNIQUE: Contiguous axial images were obtained from the base of the skull through the vertex without intravenous contrast. RADIATION DOSE REDUCTION: This exam was performed according to the departmental dose-optimization program which includes automated exposure control, adjustment of the mA and/or kV according to patient size and/or use of iterative reconstruction technique. COMPARISON:  05/08/2022 FINDINGS: Brain: Generalized atrophy. Normal ventricular morphology. No midline shift or mass effect. [place] Dunlevy vessel chronic ischemic changes of deep cerebral white matter. 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 No intracranial hemorrhage, mass lesion, or evidence of acute infarction. No extra-axial fluid collections. Vascular: No hyperdense vessels Skull: Intact Sinuses/Orbits: Clear Other: N/A IMPRESSION: Atrophy with Freddi Schrager vessel chronic ischemic changes of deep cerebral white matter. 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 No acute intracranial abnormalities. Electronically Signed   By: Lavonia Dana M.D.   On: 06/10/2022 11:25    Procedures Procedures    Medications Ordered in ED Medications  OLANZapine (ZYPREXA) tablet 10 mg (has no administration in time range)  haloperidol (HALDOL) tablet 5 mg (has no administration in time range)    Or  haloperidol lactate (HALDOL) injection 5 mg (has no administration in time range)  diphenhydrAMINE (BENADRYL) capsule 25 mg (has no administration in time range)    Or  diphenhydrAMINE (BENADRYL) injection 25 mg (has no administration in time range)  fluPHENAZine decanoate (PROLIXIN) injection 25 mg (has no administration in time range)  pantoprazole (PROTONIX) EC tablet 40 mg (has no administration in time range)  rosuvastatin (CRESTOR) tablet 40 mg (has no administration in time range)  linaclotide (LINZESS) capsule 145  mcg (has no administration in time range)    ED Course/ Medical Decision Making/ A&P                           Medical Decision Making Patient brought in on IVC, from motel after being found down.  Reports I am a child of [name] you all are devils.  History of schizophrenia per chart unwilling to answer most questions.  She is well-appearing on exam will medically evaluate and then have evaluated by psychiatry.  Amount and/or Complexity of Data Reviewed Labs: ordered.    Details: Labs unremarkable  Radiology: ordered.    Details: CT head negative. ECG/medicine tests:  Decision-making details documented in ED Course. Discussion of management or test interpretation with external provider(s): Patient medically cleared will  need to be cleared by psychiatry prior to discharge.  Possible psychosis versus schizophrenia.  Defer to psychiatric for dispo.  Risk Prescription drug management.   Final Clinical Impression(s) / ED Diagnoses Final diagnoses:  Schizophrenia, unspecified type [facility])    Rx / DC Orders ED Discharge Orders     None         Osvaldo Shipper, PA 06/10/22 1346    Lacretia Leigh, MD 06/11/22 [phone]

## 2022-06-10 NOTE — ED Triage Notes (Signed)
Pt BIB [facility] from [facility] with ivc paperwork. Per paperwork pr was hostile and aggressive.

## 2022-06-10 NOTE — ED Notes (Signed)
RN returned call from [facility] team lead, no answer VM left.

## 2022-06-10 NOTE — Consult Note (Signed)
[facility] ED ASSESSMENT   Reason for Consult:  Agitation Referring Physician:  Lacretia Leigh, MD  Patient Identification: Kaitlyn Duncan MRN:  [id number] ED Chief Complaint: Schizophrenia, unspecified ([place])  Diagnosis:  Principal Problem:   Schizophrenia, unspecified ([place]) Active Problems:   Involuntary commitment   ED Assessment Time Calculation: Start Time: 1140 Stop Time: 1200 Total Time in Minutes (Assessment Completion): 20   Subjective:   Kaitlyn Duncan is a 51 y.o. female patient with a history of Schizophrenia and Metastatic Melanoma who presents to [facility] via [facility] under IVC. Pt was recently discharged from [facility] on 06/08/22 for similar occurence.  HPI: The patient was initially found reclined in a chair with a blanket covering her head. Upon identifying as the psychiatric provider, she removed the cover and expressed her conviction as a "child of [name]," emphasizing her disbelief in psychiatry and science. She questioned the reason for being brought to the ER, asserting her adherence to legalism. The patient mentioned residing at [facility] and revealed her decision to discontinue services with an ACT team due to perceived legalistic approaches, expressing a preference for individuals who share her spiritual beliefs and are also "children of [name]". Despite these hyperreligious responses, the patient demonstrated alertness and orientation x4. She denied any suicidal or homicidal ideations. While she also denied auditory hallucinations, the patient endorsed visual hallucinations, attributing them to her spiritual perspective and that the visions "just depends". The patient reported having good sleep and appetite. However, a comprehensive examination was challenging due to her hyperreligious responses dominating the conversation.  During the evaluation today, the patient's appearance was disheveled. Her behavior was cooperative. She was able to communicate clearly with speech at a  normal rate but increased volume. Her mood was euthymic and her affect was labile and non-congruent. Her thought process was coherent and disorganized with loose associations, and her thought content was delusional. There were no signs or indications that she was currently reacting to internal stimuli. The patient denied experiencing auditory hallucinations but endorsed visual hallucinations, stating that she sees things but "it depends". She adamantly denies suicidal and homicidal ideations.   On Chart Review:   Kaitlyn Duncan 51 y.o., female patient presented to [facility] via [facility] under involuntary commitment petition by counselor Barnabas Lister Bolick ([facility]).  Per IVC:  "Respondent has been diagnosed with schizophrenia and metastatic melanoma.  She reports demons in her room.  She asked hotel staff to switch her room due to demons.  Hotel staff reports Kaitlyn Duncan has become very hostile over the past few days.  She believes people are coming into her room taking her stuff.  Respondent has attempted to break into other hotel rooms at 2:30 in the morning and yelling outside the door about demons.  She has taken a taxi twice and told the taxi driver that the hotel will pay for it.  [facility]ona has stopped her meds on March 27, 2022.  Since stopping he exhibits paranoia, hyper-religious delusional thinking, and mood labial."    Collateral Information obtained on 06/07/2022:  Spoke with Jenny Reichmann Orthoptist at [facility]) [phone].  Jenny Reichmann states that patient has been staying at [facility] for 21 weeks and thing have been fine up to a month ago "That's when I notice a decline and her not acting like normal does.  She has bang on the doors of other motel guest saying her sister was in the room, She took a taxi the other day and didn't have the money to pay  for it so told the driver that the motel would pay.  I had to pay because he wouldn't leave until paid.  Now her guardian owes the money for the taxi.  She  told me today and told me that she had talk to me and I had told her that she could stay here for free and I told her that I didn't tell her that.  One day she went up to the second floor by stairs and as you can see she ain't that steady."  Jenny Reichmann states that patient is able to return to the motel and stay until her guardian has found a place for her to stay.  Jenny Reichmann states she spoke to the patients guardian today and told her that she feels that patient needed to be place somewhere else, where she had people to help her.  I just don't feel that she needs to be here caring for herself."  States that she did not tell the guardian that patient could not return to the motel.  06/07/2022: Abby counselor at [facility] ([facility]) [place] she is aware of patient and that patient is hyper religious at base line but patient had been off of her medications and has become more irritable, mood swings, any hyper religion has worsened.  "She has been a client for the past 6 months.  Typically she is hyper religious with the demons being around.  At first we were able to get her to go to her doctor appointments, and participate in other programs but for the last 2-3 weeks she has been less engaging with staff and talking more about the demons."  Abby states that patient was taking Zyprexa 10 mg but was in the process of switching to Perseris but patient never got around to getting injection.      Risk to Self or Others: Is the patient at risk to self? Yes Has the patient been a risk to self in the past 6 months? No Has the patient been a risk to self within the distant past? Yes Is the patient a risk to others? No Has the patient been a risk to others in the past 6 months? No Has the patient been a risk to others within the distant past? No  [place] Scale:  [place] ED from 06/07/2022 in [facility] ED from 05/25/2022 in [place] DEPT  ED from 05/08/2022 in [place] No Risk No Risk No Risk       AIMS:  , , ,  ,   ASAM:    Substance Abuse:     Past Medical History:  Past Medical History:  Diagnosis Date   Allergy    Anemia    Gallstones    GERD (gastroesophageal reflux disease)    Hemorrhoids    melanoma with met dz dx'd 01/2018   brain and adrenal   Seasonal allergies    Stroke [facility])     Past Surgical History:  Procedure Laterality Date   BIOPSY  01/28/2018   Procedure: BIOPSY;  Surgeon: Laurence Spates, MD;  Location: [facility];  Service: Endoscopy;;   BREAST BIOPSY Left    ESOPHAGOGASTRODUODENOSCOPY N/A 01/28/2018   Procedure: ESOPHAGOGASTRODUODENOSCOPY (EGD);  Surgeon: Laurence Spates, MD;  Location: [facility];  Service: Endoscopy;  Laterality: N/A;   MOUTH SURGERY     OVARY SURGERY     removed "something"   Family History:  Family History  Problem Relation Age of Onset   Melanoma Mother    Hypertension Father    Breast cancer Maternal Aunt    Breast cancer Paternal Aunt    Diabetes Paternal Aunt    Breast cancer Maternal Grandmother    Breast cancer Paternal Grandmother    Diabetes Paternal Grandmother    Colon cancer Neg Hx    Esophageal cancer Neg Hx    Pancreatic cancer Neg Hx    Stomach cancer Neg Hx    Liver disease Neg Hx    Rectal cancer Neg Hx     Social History:  Social History   Substance and Sexual Activity  Alcohol Use No     Social History   Substance and Sexual Activity  Drug Use No    Social History   Socioeconomic History   Marital status: Single    Spouse name: Not on file   Number of children: Not on file   Years of education: Not on file   Highest education level: Not on file  Occupational History   Not on file  Tobacco Use   Smoking status: Never   Smokeless tobacco: Never  Vaping Use   Vaping Use: Never used  Substance and Sexual Activity   Alcohol use: No   Drug use: No   Sexual  activity: Not Currently  Other Topics Concern   Not on file  Social History Narrative   Not on file   Social Determinants of Health   Financial Resource Strain: Not on file  Food Insecurity: Food Insecurity Present (04/09/2022)   Hunger Vital Sign    Worried About Running Out of Food in the Last Year: Sometimes true    Ran Out of Food in the Last Year: Sometimes true  Transportation Needs: Unmet Transportation Needs (04/09/2022)   PRAPARE - Hydrologist (Medical): Yes    Lack of Transportation (Non-Medical): Yes  Physical Activity: Not on file  Stress: Not on file  Social Connections: Not on file   Additional Social History:    Allergies:  No Known Allergies  Labs:  Results for orders placed or performed during the hospital encounter of 06/10/22 (from the past 48 hour(s))  Comprehensive metabolic panel     Status: Abnormal   Collection Time: 06/10/22 11:04 AM  Result Value Ref Range   Sodium 136 135 - 145 mmol/L   Potassium 3.5 3.5 - 5.1 mmol/L   Chloride 105 98 - 111 mmol/L   CO2 19 (L) 22 - 32 mmol/L   Glucose, Bld 121 (H) 70 - 99 mg/dL    Comment: Glucose reference range applies only to samples taken after fasting for at least 8 hours.   BUN 19 6 - 20 mg/dL   Creatinine, Ser 0.64 0.44 - 1.00 mg/dL   Calcium 9.7 8.9 - 10.3 mg/dL   Total Protein 8.7 (H) 6.5 - 8.1 g/dL   Albumin 4.9 3.5 - 5.0 g/dL   AST 36 15 - 41 U/L   ALT 24 0 - 44 U/L   Alkaline Phosphatase 95 38 - 126 U/L   Total Bilirubin 0.9 0.3 - 1.2 mg/dL   GFR, Estimated >60 >60 mL/min    Comment: (NOTE) Calculated using the CKD-EPI Creatinine Equation (2021)    Anion gap 12 5 - 15    Comment: Performed at [facility], [place] [address], [place], [place] [address]  Ethanol     Status: None   Collection Time: 06/10/22 11:04 AM  Result Value Ref Range   Alcohol, Ethyl (B) <10 <10 mg/dL    Comment: (NOTE) Lowest detectable limit for serum alcohol is 10  mg/dL.  For medical purposes only. Performed at [facility], [place] [address] [place], [place] [address]   CBC with Diff     Status: Abnormal   Collection Time: 06/10/22 11:04 AM  Result Value Ref Range   WBC 4.3 4.0 - 10.5 K/uL   RBC 5.31 (H) 3.87 - 5.11 MIL/uL   Hemoglobin 14.4 12.0 - 15.0 g/dL   HCT 44.2 36.0 - 46.0 %   MCV 83.2 80.0 - 100.0 fL   MCH 27.1 26.0 - 34.0 pg   MCHC 32.6 30.0 - 36.0 g/dL   RDW 12.6 11.5 - 15.5 %   Platelets 79 (L) 150 - 400 K/uL    Comment: SPECIMEN CHECKED FOR CLOTS Immature Platelet Fraction may be clinically indicated, consider ordering this additional test [id number] REPEATED TO VERIFY PLATELET COUNT CONFIRMED BY SMEAR    nRBC 0.0 0.0 - 0.2 %   Neutrophils Relative % 62 %   Neutro Abs 2.6 1.7 - 7.7 K/uL   Lymphocytes Relative 27 %   Lymphs Abs 1.2 0.7 - 4.0 K/uL   Monocytes Relative 11 %   Monocytes Absolute 0.5 0.1 - 1.0 K/uL   Eosinophils Relative 0 %   Eosinophils Absolute 0.0 0.0 - 0.5 K/uL   Basophils Relative 0 %   Basophils Absolute 0.0 0.0 - 0.1 K/uL   Immature Granulocytes 0 %   Abs Immature Granulocytes 0.01 0.00 - 0.07 K/uL    Comment: Performed at [facility], [place] [address], [place], [place] [address]  Ammonia     Status: None   Collection Time: 06/10/22 11:04 AM  Result Value Ref Range   Ammonia 17 9 - 35 umol/L    Comment: Performed at [facility], [place] [address], [place], [place] [address]    Current Facility-Administered Medications  Medication Dose Route Frequency Provider Last Rate Last Admin   diphenhydrAMINE (BENADRYL) capsule 25 mg  25 mg Oral Once PRN Rozetta Nunnery, NP       Or   diphenhydrAMINE (BENADRYL) injection 25 mg  25 mg Intramuscular Once PRN Lindon Romp A, NP       haloperidol (HALDOL) tablet 5 mg  5 mg Oral Once PRN Rozetta Nunnery, NP       Or   haloperidol lactate (HALDOL) injection 5 mg  5 mg Intramuscular Once PRN Rozetta Nunnery, NP        OLANZapine (ZYPREXA) tablet 10 mg  10 mg Oral QHS Rozetta Nunnery, NP       Current Outpatient Medications  Medication Sig Dispense Refill   fluPHENAZine decanoate (PROLIXIN) 25 MG/ML injection Inject 25 mg into the muscle every 14 (fourteen) days.     linaclotide (LINZESS) 145 MCG CAPS capsule Take 1 capsule (145 mcg total) by mouth in the morning. 30 capsule 11   meloxicam (MOBIC) 15 MG tablet Take 1 tablet (15 mg total) by mouth daily. 7 tablet 0   omeprazole (PRILOSEC) 20 MG capsule Take 1 capsule (20 mg total) by mouth in the morning. 30 capsule 11   rosuvastatin (CRESTOR) 40 MG tablet Take 1 tablet (40 mg total) by mouth daily. 30 tablet 3    Musculoskeletal: Strength & Muscle Tone: within normal limits Gait & Station: normal Patient leans: N/A   Psychiatric Specialty Exam: Presentation  General Appearance:  Disheveled  Eye Contact: Good  Speech: Clear and Coherent; Normal Rate  Speech Volume: Increased  Handedness: Right   Mood and Affect  Mood: Euthymic  Affect: Non-Congruent; Labile   Thought Process  Thought Processes: Coherent; Disorganized  Descriptions of Associations:Loose  Orientation:Full (Time, Place and Person)  Thought Content:Delusions  History of Schizophrenia/Schizoaffective disorder:Yes  Duration of Psychotic Symptoms:Greater than six months  Hallucinations:Hallucinations: None  Ideas of Reference:Delusions  Suicidal Thoughts:Suicidal Thoughts: No  Homicidal Thoughts:Homicidal Thoughts: No   Sensorium  Memory: Immediate Fair; Recent Fair  Judgment: Impaired  Insight: Lacking   Executive Functions  Concentration: Poor  Attention Span: Poor  Recall: [employer] of Knowledge: Fair  Language: Fair   Psychomotor Activity  Psychomotor Activity: Psychomotor Activity: Normal   Assets  Assets: Resilience    Sleep  Sleep: Sleep: Good   Physical Exam: Physical Exam Constitutional:       General: She is not in acute distress.    Appearance: She is not ill-appearing, toxic-appearing or diaphoretic.  Eyes:     General:        Right eye: No discharge.        Left eye: No discharge.  Cardiovascular:     Rate and Rhythm: Normal rate.  Pulmonary:     Effort: Pulmonary effort is normal. No respiratory distress.  Musculoskeletal:        General: Normal range of motion.     Cervical back: Normal range of motion.  Neurological:     Mental Status: She is alert and oriented to person, place, and time.  Psychiatric:        Thought Content: Thought content is delusional. Thought content does not include suicidal ideation.    Review of Systems  Respiratory:  Negative for cough and shortness of breath.   Cardiovascular:  Negative for chest pain.  Gastrointestinal:  Negative for diarrhea, nausea and vomiting.  Psychiatric/Behavioral:  Positive for hallucinations. Negative for depression, substance abuse and suicidal ideas.    Blood pressure (!) 124/90, pulse 90, temperature 98.4 F (36.9 C), temperature source Oral, resp. rate (!) 33, height 5' (1.524 m), weight 63 kg, SpO2 99 %. Body mass index is 27.13 kg/m.  Medical Decision Making: Yazmin Locher is a 51 y.o. female patient with a history of Schizophrenia and Metastatic Melanoma who presents to [facility] via [facility] under IVC. Pt was recently discharged from [facility] on 06/08/22 for similar occurence. The patient was initially found reclined in a chair with a blanket covering her head. Upon identifying as the psychiatric provider, she removed the cover and expressed her conviction as a "child of [name]," emphasizing her disbelief in psychiatry and science. She questioned the reason for being brought to the ER, asserting her adherence to legalism. The patient mentioned residing at [facility] and revealed her decision to discontinue services with an ACT team due to perceived legalistic approaches, expressing a preference for individuals who  share her spiritual beliefs and are also "children of [name]". Despite these hyperreligious responses, the patient demonstrated alertness and orientation x4. She denied any suicidal or homicidal ideations. While she also denied auditory hallucinations, the patient endorsed visual hallucinations, attributing them to her spiritual perspective and that the visions "just depends". The patient reported having good sleep and appetite. However, a comprehensive examination was challenging due to her hyperreligious responses dominating the conversation.   Start olanzapine 10 mg QHS for psychosis  Disposition: Recommend psychiatric Inpatient admission when medically cleared.  Rozetta Nunnery, NP 06/10/2022  1:19 PM

## 2022-06-10 NOTE — ED Notes (Signed)
Pt has been changed in burgundy scrubs.

## 2022-06-10 NOTE — ED Notes (Signed)
Pt is currently eating.  

## 2022-06-10 NOTE — ED Notes (Signed)
Pt has 2 personal belonging bags located in cabinet labelled [address]

## 2022-06-10 NOTE — ED Notes (Signed)
Pt asleep, holding meds until she wakes up

## 2022-06-11 DIAGNOSIS — F209 Schizophrenia, unspecified: Secondary | ICD-10-CM | POA: Diagnosis not present

## 2022-06-11 MED ORDER — OLANZAPINE 10 MG PO TABS
10.0000 mg | ORAL_TABLET | Freq: Every day | ORAL | Status: DC
  Filled 2022-06-11: qty 1

## 2022-06-11 MED ORDER — LORAZEPAM 2 MG/ML IJ SOLN
1.0000 mg | Freq: Once | INTRAMUSCULAR | Status: AC
  Administered 2022-06-11: 1 mg via INTRAMUSCULAR
  Filled 2022-06-11: qty 1

## 2022-06-11 NOTE — Progress Notes (Signed)
[facility] LCSW Progress Note   06/11/2022    11:08 PM  Kaitlyn Duncan   [id number]   Type of Contact and Topic:  Psychiatric Bed Placement   Pt accepted to [facility]      Patient meets inpatient criteria per Cleon Dew, RN   The attending provider will be Jonelle Sports, MD   Call report to [phone]  [name], RN @ [facility] ED notified.     Pt scheduled  to arrive at [place] 0900.    Mariea Clonts, MSW, LCSW-A  11:09 PM 06/11/2022

## 2022-06-11 NOTE — Progress Notes (Signed)
Patient has been denied by [facility] due to no appropriate beds available. Patient meets [place] inpatient criteria per Griselda Miner, [place]. Patient has been faxed out to the following facilities:    [facility]  [place] [place] [address] [phone] [place]., [place] [address] [phone]  [facility]  [address], [place] [address] [phone] [place]  [address], [place] [address] [phone] [place]  [address] [place] [address] [phone] [facility]  [address], [place] [address] [phone] [facility]  [place], [place] [address] [phone]  [facility]  [address] [place], [place] [address] [phone]  [facility]  [phone] [address] [place] [address] [phone] [facility]  [place] [place] [address] [phone] [facility]  [address], [place] [address] [phone] [facility]  [address], [place] [address] [place]  [facility]  [address], [place] [address] [phone]  [facility]  [address] [place] [address] [phone]  [facility] [address] [place] [address] [place]  [facility]  [address], [place] [address] [phone]  [facility]  [place], [place] [address] [phone]  [facility]  [address] [place] [address] [place] [address] [place] [address] [phone] [facility]  [address] [place], [place] [address] [phone] [facility]  [address], [place] [address] [phone]  [facility]  [address], [place] [address] [place]  [facility]  [address] [place] [address] [place], MSW, LCSW-A  10:07 PM 06/11/2022

## 2022-06-11 NOTE — ED Notes (Signed)
Patient became very agitated stating that she has a family emergency. Pt is yelling attempted to leave threw TCU. Patient is reacting to internal stimuli.

## 2022-06-11 NOTE — Progress Notes (Signed)
LCSW Progress Note  [id number]   Kaitlyn Duncan  06/11/2022  4:09 PM  Description:   Inpatient Psychiatric Referral  Patient was recommended inpatient per [facility], Al Pimple, PMHNP. There is no available beds at [facility]. Patient was referred to the following facilities:   Destination Service Provider Address Phone [facility]  [place]., [place] [address] [phone]  [place] [place] [address] [phone]  [facility]  [address], [place] [address] [phone] [place]  [address] [place] [address] [phone]  [facility]  [address] [place] [address] [phone] [place]  [address] [place] [address] [phone]  [facility]  [address], [place] [address] [phone] [facility]  [place], [place] [address] [phone]  [facility]  [address] [place],  [place] [address] [phone]  [facility]  [phone] [address] [place] [address] [phone] [facility]  [place]., [place] [address] [phone] [facility]  [address] [place] [address] [phone] [facility]  [address] [place] [address] [phone]  [facility]  [address] [place] [address] [phone]  [facility]  [address] [place] [address] [phone]  [facility] [address] [place] [address] [facility]  [address], [place] [address] [phone]  [facility]  [place], [place] [address] [phone]  [facility]  [address] [place] [address] [phone] [place] [place] [address] [phone] [facility]  [address] [place], [place] [address] [phone] [facility]  [address] [place] [address] [phone]  [facility]  [address], [place] [address] [place]  [facility]  [address], [place] [address] [phone]      Situation ongoing, CSW to continue following and update chart as more information becomes available.      Denna Haggard, [place]  06/11/2022 4:09 PM

## 2022-06-11 NOTE — Progress Notes (Addendum)
[facility] Psych ED Progress Note  06/11/2022 3:13 PM Kaitlyn Duncan  MRN:  [id number]   Principal Problem: Schizophrenia, unspecified ([place]) Diagnosis:  Principal Problem:   Schizophrenia, unspecified ([place]) Active Problems:   Involuntary commitment  Subjective: Kaitlyn Duncan is a 51 yr old female with a history of Schizophrenia and Metastaic Melanoma who [facility] to [facility] via [facility]  under IVC. Patient was recently discharged from [facility] on 06/08/22 for similar occurrence.   HPI: Kaitlyn Duncan, 51 y.o., female patient seen face to face by this provider chart reviewed on 06/11/22. On evaluation Kaitlyn Duncan reports that she is ready to be discharged so she can go home. When asked if she knew who Kaitlyn Duncan was she said "yeah she is apart of my ACT Team. When asked if she was going to take any of her medications, patient stated "Linzess is only medication my heavenly father says I need". Patient denies SI/HI/AVH.   During evaluation Kaitlyn Duncan is laying in bed with covers pulled up, in no acute distress. She is alert, oriented x 4, calm, and cooperative. Her mood is euthymic with congruent affect. She has normal speech, and behavior.  Objectively there is no evidence of psychosis/mania or delusional thinking. Patient is able to converse coherently, no distractibility, or pre-occupation. She also denies suicidal/self-harm/homicidal ideation, psychosis, and paranoia. Patient answered question appropriately.    ED Assessment Time Calculation: Start Time: 1430 Stop Time: 1450 Total Time in Minutes (Assessment Completion): 20   Past Psychiatric History: Schizophrenia  [place] Scale:  [place] [facility] from 05/25/2022 in [facility] DEPT ED from 05/08/2022 in [place] No Risk No Risk No Risk       Past Medical History:   Past Medical History:  Diagnosis Date   Allergy    Anemia    Gallstones    GERD (gastroesophageal reflux disease)    Hemorrhoids    melanoma with met dz dx'd 01/2018   brain and adrenal   Seasonal allergies    Stroke [facility])     Past Surgical History:  Procedure Laterality Date   BIOPSY  01/28/2018   Procedure: BIOPSY;  Surgeon: Laurence Spates, MD;  Location: [facility] ENDOSCOPY;  Service: Endoscopy;;   BREAST BIOPSY Left    ESOPHAGOGASTRODUODENOSCOPY N/A 01/28/2018   Procedure: ESOPHAGOGASTRODUODENOSCOPY (EGD);  Surgeon: Laurence Spates, MD;  Location: [facility];  Service: Endoscopy;  Laterality: N/A;   MOUTH SURGERY     OVARY SURGERY     removed "something"   Family History:  Family History  Problem Relation Age of Onset   Melanoma Mother    Hypertension Father    Breast cancer Maternal Aunt    Breast cancer Paternal Aunt    Diabetes Paternal Aunt    Breast cancer Maternal Grandmother    Breast cancer Paternal Grandmother    Diabetes Paternal Grandmother    Colon cancer Neg Hx    Esophageal cancer Neg Hx    Pancreatic cancer Neg Hx    Stomach cancer Neg Hx    Liver disease Neg Hx    Rectal cancer Neg Hx     Social History:  Social History   Substance and Sexual Activity  Alcohol Use No     Social History   Substance and Sexual Activity  Drug Use No    Social History   Socioeconomic History   Marital status: Single  Spouse name: Not on file   Number of children: Not on file   Years of education: Not on file   Highest education level: Not on file  Occupational History   Not on file  Tobacco Use   Smoking status: Never   Smokeless tobacco: Never  Vaping Use   Vaping Use: Never used  Substance and Sexual Activity   Alcohol use: No   Drug use: No   Sexual activity: Not Currently  Other Topics Concern   Not on file  Social History Narrative   Not on file   Social Determinants of Health   Financial Resource Strain: Not on file  Food Insecurity:  Food Insecurity Present (04/09/2022)   Hunger Vital Sign    Worried About Running Out of Food in the Last Year: Sometimes true    Ran Out of Food in the Last Year: Sometimes true  Transportation Needs: Unmet Transportation Needs (04/09/2022)   PRAPARE - Hydrologist (Medical): Yes    Lack of Transportation (Non-Medical): Yes  Physical Activity: Not on file  Stress: Not on file  Social Connections: Not on file    Sleep: Good  Appetite:  Good  Current Medications: Current Facility-Administered Medications  Medication Dose Route Frequency Provider Last Rate Last Admin   fluPHENAZine decanoate (PROLIXIN) injection 25 mg  25 mg Intramuscular Q14 Days Small, Brooke L, PA   25 mg at 06/10/22 1420   linaclotide (LINZESS) capsule 145 mcg  145 mcg Oral Daily Small, Brooke L, PA       OLANZapine (ZYPREXA) tablet 10 mg  10 mg Oral QHS Gwenlyn Found, Jason A, NP       pantoprazole (PROTONIX) EC tablet 40 mg  40 mg Oral Daily Small, Brooke L, PA       rosuvastatin (CRESTOR) tablet 40 mg  40 mg Oral Daily Small, Brooke L, PA       Current Outpatient Medications  Medication Sig Dispense Refill   linaclotide (LINZESS) 145 MCG CAPS capsule Take 1 capsule (145 mcg total) by mouth in the morning. 30 capsule 11   fluPHENAZine decanoate (PROLIXIN) 25 MG/ML injection Inject 25 mg into the muscle every 14 (fourteen) days. (Patient not taking: Reported on 06/10/2022)     meloxicam (MOBIC) 15 MG tablet Take 1 tablet (15 mg total) by mouth daily. (Patient not taking: Reported on 06/10/2022) 7 tablet 0   omeprazole (PRILOSEC) 20 MG capsule Take 1 capsule (20 mg total) by mouth in the morning. (Patient not taking: Reported on 06/10/2022) 30 capsule 11   rosuvastatin (CRESTOR) 40 MG tablet Take 1 tablet (40 mg total) by mouth daily. (Patient not taking: Reported on 06/10/2022) 30 tablet 3    Lab Results:  Results for orders placed or performed during the hospital encounter of 06/10/22 (from the  past 48 hour(s))  Comprehensive metabolic panel     Status: Abnormal   Collection Time: 06/10/22 11:04 AM  Result Value Ref Range   Sodium 136 135 - 145 mmol/L   Potassium 3.5 3.5 - 5.1 mmol/L   Chloride 105 98 - 111 mmol/L   CO2 19 (L) 22 - 32 mmol/L   Glucose, Bld 121 (H) 70 - 99 mg/dL    Comment: Glucose reference range applies only to samples taken after fasting for at least 8 hours.   BUN 19 6 - 20 mg/dL   Creatinine, Ser 0.64 0.44 - 1.00 mg/dL   Calcium 9.7 8.9 - 10.3 mg/dL   Total  Protein 8.7 (H) 6.5 - 8.1 g/dL   Albumin 4.9 3.5 - 5.0 g/dL   AST 36 15 - 41 U/L   ALT 24 0 - 44 U/L   Alkaline Phosphatase 95 38 - 126 U/L   Total Bilirubin 0.9 0.3 - 1.2 mg/dL   GFR, Estimated >60 >60 mL/min    Comment: (NOTE) Calculated using the CKD-EPI Creatinine Equation (2021)    Anion gap 12 5 - 15    Comment: Performed at [facility], [place] [address] [place], [place] [address]  Ethanol     Status: None   Collection Time: 06/10/22 11:04 AM  Result Value Ref Range   Alcohol, Ethyl (B) <10 <10 mg/dL    Comment: (NOTE) Lowest detectable limit for serum alcohol is 10 mg/dL.  For medical purposes only. Performed at [facility], [place] [address] [place], [place] [address]   CBC with Diff     Status: Abnormal   Collection Time: 06/10/22 11:04 AM  Result Value Ref Range   WBC 4.3 4.0 - 10.5 K/uL   RBC 5.31 (H) 3.87 - 5.11 MIL/uL   Hemoglobin 14.4 12.0 - 15.0 g/dL   HCT 44.2 36.0 - 46.0 %   MCV 83.2 80.0 - 100.0 fL   MCH 27.1 26.0 - 34.0 pg   MCHC 32.6 30.0 - 36.0 g/dL   RDW 12.6 11.5 - 15.5 %   Platelets 79 (L) 150 - 400 K/uL    Comment: SPECIMEN CHECKED FOR CLOTS Immature Platelet Fraction may be clinically indicated, consider ordering this additional test [id number] REPEATED TO VERIFY PLATELET COUNT CONFIRMED BY SMEAR    nRBC 0.0 0.0 - 0.2 %   Neutrophils Relative % 62 %   Neutro Abs 2.6 1.7 - 7.7 K/uL   Lymphocytes Relative 27 %    Lymphs Abs 1.2 0.7 - 4.0 K/uL   Monocytes Relative 11 %   Monocytes Absolute 0.5 0.1 - 1.0 K/uL   Eosinophils Relative 0 %   Eosinophils Absolute 0.0 0.0 - 0.5 K/uL   Basophils Relative 0 %   Basophils Absolute 0.0 0.0 - 0.1 K/uL   Immature Granulocytes 0 %   Abs Immature Granulocytes 0.01 0.00 - 0.07 K/uL    Comment: Performed at [facility], [place] [address] [place], [place] [address]  Ammonia     Status: None   Collection Time: 06/10/22 11:04 AM  Result Value Ref Range   Ammonia 17 9 - 35 umol/L    Comment: Performed at [facility], [place] [address] [place], [place] [address]  Urine rapid drug screen (hosp performed)     Status: None   Collection Time: 06/10/22 12:57 PM  Result Value Ref Range   Opiates NONE DETECTED NONE DETECTED   Cocaine NONE DETECTED NONE DETECTED   Benzodiazepines NONE DETECTED NONE DETECTED   Amphetamines NONE DETECTED NONE DETECTED   Tetrahydrocannabinol NONE DETECTED NONE DETECTED   Barbiturates NONE DETECTED NONE DETECTED    Comment: (NOTE) DRUG SCREEN FOR MEDICAL PURPOSES ONLY.  IF CONFIRMATION IS NEEDED FOR ANY PURPOSE, NOTIFY LAB WITHIN 5 DAYS.  LOWEST DETECTABLE LIMITS FOR URINE DRUG SCREEN Drug Class                     Cutoff (ng/mL) Amphetamine and metabolites    1000 Barbiturate and metabolites    200 Benzodiazepine                 200 Opiates  and metabolites        300 Cocaine and metabolites        300 THC                            50 Performed at [facility], [place] [address], [place], [place] [address]   Urinalysis, Routine w reflex microscopic Urine, Clean Catch     Status: Abnormal   Collection Time: 06/10/22 12:57 PM  Result Value Ref Range   Color, Urine YELLOW YELLOW   APPearance CLEAR CLEAR   Specific Gravity, Urine 1.020 1.005 - 1.030   pH 5.0 5.0 - 8.0   Glucose, UA NEGATIVE NEGATIVE mg/dL   Hgb urine dipstick NEGATIVE NEGATIVE   Bilirubin Urine NEGATIVE  NEGATIVE   Ketones, ur 20 (A) NEGATIVE mg/dL   Protein, ur 100 (A) NEGATIVE mg/dL   Nitrite NEGATIVE NEGATIVE   Leukocytes,Ua NEGATIVE NEGATIVE   RBC / HPF 0-5 0 - 5 RBC/hpf   WBC, UA 0-5 0 - 5 WBC/hpf   Bacteria, UA RARE (A) NONE SEEN   Squamous Epithelial / LPF 0-5 0 - 5   Mucus PRESENT     Comment: Performed at [facility], [place] [address], [place], [place] [address]  SARS Coronavirus 2 by RT PCR (hospital order, performed in [facility] hospital lab) *cepheid single result test* Anterior Nasal Swab     Status: None   Collection Time: 06/10/22  1:24 PM   Specimen: Anterior Nasal Swab  Result Value Ref Range   SARS Coronavirus 2 by RT PCR NEGATIVE NEGATIVE    Comment: (NOTE) SARS-CoV-2 target nucleic acids are NOT DETECTED.  The SARS-CoV-2 RNA is generally detectable in upper and lower respiratory specimens during the acute phase of infection. The lowest concentration of SARS-CoV-2 viral copies this assay can detect is 250 copies / mL. A negative result does not preclude SARS-CoV-2 infection and should not be used as the sole basis for treatment or other patient management decisions.  A negative result may occur with improper specimen collection / handling, submission of specimen other than nasopharyngeal swab, presence of viral mutation(s) within the areas targeted by this assay, and inadequate number of viral copies (<250 copies / mL). A negative result must be combined with clinical observations, patient history, and epidemiological information.  Fact Sheet for Patients:   https://www.patel.info/  Fact Sheet for Healthcare Providers: https://hall.com/  This test is not yet approved or  cleared by the [place] FDA and has been authorized for detection and/or diagnosis of SARS-CoV-2 by FDA under an Emergency Use Authorization (EUA).  This EUA will remain in effect (meaning this test can be used) for the  duration of the COVID-19 declaration under Section 564(b)(1) of the Act, 21 U.S.C. section 360bbb-3(b)(1), unless the authorization is terminated or revoked sooner.  Performed at [facility], [place] [address] [place], [place] [address]     Blood Alcohol level:  Lab Results  Component Value Date   [facility] <10 06/10/2022   ETH <10 06/07/2022    Physical Findings:  CIWA:    COWS:     Musculoskeletal: Strength & Muscle Tone: within normal limits Gait & Station: normal Patient leans: N/A  Psychiatric Specialty Exam:  Presentation  General Appearance:  Appropriate for Environment  Eye Contact: Good  Speech: Clear and Coherent  Speech Volume: Normal  Handedness: Right   Mood and Affect  Mood: Euthymic  Affect:  Appropriate   Thought Process  Thought Processes: Coherent  Descriptions of Associations:Intact  Orientation:Full (Time, Place and Person)  Thought Content:WDL  History of Schizophrenia/Schizoaffective disorder:Yes  Duration of Psychotic Symptoms:Greater than six months  Hallucinations:Hallucinations: None  Ideas of Reference:None  Suicidal Thoughts:Suicidal Thoughts: No  Homicidal Thoughts:Homicidal Thoughts: No   Sensorium  Memory: Immediate Fair  Judgment: Good  Insight: Fair   Community education officer  Concentration: Fair  Attention Span: Good  Recall: Good  Fund of Knowledge: Good  Language: Fair   Psychomotor Activity  Psychomotor Activity: Psychomotor Activity: Normal   Assets  Assets: Communication Skills; Social Support   Sleep  Sleep: Sleep: Fair    Physical Exam: Physical Exam HENT:     Head: Normocephalic.  Eyes:     Pupils: Pupils are equal, round, and reactive to light.  Pulmonary:     Effort: Pulmonary effort is normal.  Musculoskeletal:        General: Normal range of motion.  Neurological:     Mental Status: She is alert.  Psychiatric:        Mood and Affect:  Mood normal.        Behavior: Behavior normal.        Judgment: Judgment normal.    Review of Systems  Constitutional: Negative.   Respiratory: Negative.    Musculoskeletal: Negative.   Psychiatric/Behavioral: Negative.     Blood pressure 128/88, pulse (!) 101, temperature 98.3 F (36.8 C), temperature source Oral, resp. rate 16, height 5' (1.524 m), weight 63 kg, SpO2 96 %. Body mass index is 27.13 kg/m.   Medical Decision Making: Kaitlyn Duncan is a 52 yr old female who has a history of Schizophrenia and Metastaic Melanoma who presents to [facility] via [facility]  under IVC. Patient was recently discharged from [facility] on 06/08/22 for similar occurrence. Patient is alert and oriented x 4. Patient denies SI/HI/AVH, continue to recommend inpatient hospitalization for this patient.  Patient was administered her Prolixin inj. 25 mg on 06/10/22 at 1420.  Spoke with Jeralyn Ruths [phone] ACT Team lead and Mrs. Ricard Dillon discussed that patient was on Prolixin 7.5 mg BID and Zyprexa 10 mg _0 , were the only medications she would take. Patient stopped taking her medications at the end of September. Mrs. Ricard Dillon stated the ACT team seen patient on Monday 06/07/22 and they IVC'd her due to delusions.     Kaitlyn Duncan, PMHNP 06/11/2022, 3:13 PM

## 2022-06-12 DIAGNOSIS — F209 Schizophrenia, unspecified: Secondary | ICD-10-CM | POA: Diagnosis not present

## 2022-06-12 NOTE — ED Notes (Signed)
Pt sleep throughout the night. No confusion, agressiveness, anxiety noted.

## 2022-06-12 NOTE — ED Notes (Signed)
Pt ambulated to the bathroom without any issues. 

## 2022-06-12 NOTE — ED Provider Notes (Signed)
Emergency Medicine Observation Re-evaluation Note  Kaitlyn Duncan is a 51 y.o. female, seen on rounds today.  Pt initially presented to the ED for complaints of IVC Currently, the patient is awaiting psychiatric admission.  Physical Exam  BP (!) 155/102 (BP Location: Right Arm)   Pulse 79   Temp 97.8 F (36.6 C) (Oral)   Resp 18   Ht 5' (1.524 m)   Wt 63 kg   SpO2 91%   BMI 27.13 kg/m  Physical Exam Alert in no acute distress  ED Course / MDM  EKG:EKG Interpretation  Date/Time:  Thursday June 10 2022 11:34:50 EST Ventricular Rate:  88 PR Interval:  143 QRS Duration: 100 QT Interval:  370 QTC Calculation: 448 R Axis:   103 Text Interpretation: Sinus rhythm Right axis deviation ST elev, probable normal early repol pattern No significant change since last tracing Confirmed by Lacretia Leigh ([id number]) on 06/10/2022 11:53:54 AM  I have reviewed the labs performed to date as well as medications administered while in observation.  Recent changes in the last 24 hours include none.  Plan  Current plan is for psychiatric admission.    Milton Ferguson, MD 06/12/22 1357

## 2022-06-12 NOTE — ED Notes (Signed)
Patient refused VS this morning.

## 2022-06-12 NOTE — ED Notes (Signed)
Attempted report to number in chart. No answer, unable to leave message. Have left callback number with other [facility] # provided.

## 2022-06-12 NOTE — Progress Notes (Signed)
CSW spoke with Lonell Face with [facility] admissions regarding this patient. It was reported that the patient is under review.    Glennie [place], MSW, [place]rence [place] Phone: [phone] Disposition/TOC

## 2022-06-12 NOTE — ED Notes (Signed)
Per [facility], patient declined due to cancer mets to brain.

## 2022-06-12 NOTE — Progress Notes (Signed)
[facility] Psych ED Progress Note  06/12/2022 1:59 PM Kaitlyn Duncan  MRN:  [id number]   Principal Problem: Schizophrenia, unspecified ([place]) Diagnosis:  Principal Problem:   Schizophrenia, unspecified ([place]) Active Problems:   Involuntary commitment  ED Assessment Time Calculation: Start Time: 1000 Stop Time: 1030 Total Time in Minutes (Assessment Completion): 30   Subjective: Kaitlyn Duncan is a 51 yr old female with a history of Schizophrenia and Metastaic Melanoma who presents to [facility] via [facility]  under IVC. Patient was recently discharged from [facility] on 06/08/22 for similar occurrence.   HPI: Kaitlyn Duncan, 51 y.o., female patient seen face to face by this provider chart reviewed on 06/12/22. On evaluation Kaitlyn Duncan reports that she is ready to be discharged so she can go home, when asked where she would go she said back to my hotel the [facility] and if they will not allow me to go they will help me find another place. Patient continues to say she will not take any medications except for the Linzess the only medication her Kaitlyn Duncan Father says she needs. [name] is her Legal Guardian. Patient denies SI/HI/AVH. Patient states her sleep and appetite have been good.   During evaluation Kaitlyn Duncan is sitting on bed just finishing up breakfast, she appears to be in no acute distress. She is alert, oriented x 4, calm, and cooperative. Her mood is euthymic with congruent affect. She has normal speech, and behavior. There appears to be some delusional thinking when it comes to psychiatric medications. Patient is able to converse coherently, no distractibility, or pre-occupation. She also denies suicidal/self-harm/homicidal ideation, psychosis, and paranoia. Patient answered question appropriately.  Provider spoke with Kaitlyn Duncan to see if they were able to assist patient in finding housing and Mrs. Ricard Dillon stated they are able to help if Legal Guardian can't but they  refer to the legal guardian first. She also informed me, that the [facility] "Jenny Reichmann" does not want patient back on property and will not assist her in finding another room, Mrs. Ricard Dillon stated that Jenny Reichmann never told patient that she will help her in finding somewhere else to stay.  Discussed plan of care with Dr. Dwyane Dee who is in agreement that she does not need to be discharged until we can speak with her legal guardian.     Past Psychiatric History: Schizophrenia  [place] Scale:  [place] ED from 06/07/2022 in [facility] ED from 05/25/2022 in [place] DEPT ED from 05/08/2022 in [place] No Risk No Risk No Risk       Past Medical History:  Past Medical History:  Diagnosis Date   Allergy    Anemia    Gallstones    GERD (gastroesophageal reflux disease)    Hemorrhoids    melanoma with met dz dx'd 01/2018   brain and adrenal   Seasonal allergies    Stroke [facility])     Past Surgical History:  Procedure Laterality Date   BIOPSY  01/28/2018   Procedure: BIOPSY;  Surgeon: Laurence Spates, MD;  Location: [facility] ENDOSCOPY;  Service: Endoscopy;;   BREAST BIOPSY Left    ESOPHAGOGASTRODUODENOSCOPY N/A 01/28/2018   Procedure: ESOPHAGOGASTRODUODENOSCOPY (EGD);  Surgeon: Laurence Spates, MD;  Location: [facility];  Service: Endoscopy;  Laterality: N/A;   MOUTH SURGERY     OVARY SURGERY     removed "something"   Family History:  Family History  Problem Relation Age  of Onset   Melanoma Mother    Hypertension Father    Breast cancer Maternal Aunt    Breast cancer Paternal Aunt    Diabetes Paternal Aunt    Breast cancer Maternal Grandmother    Breast cancer Paternal Grandmother    Diabetes Paternal Grandmother    Colon cancer Neg Hx    Esophageal cancer Neg Hx    Pancreatic cancer Neg Hx    Stomach cancer Neg Hx    Liver disease Neg Hx    Rectal cancer Neg Hx      Social History:  Social History   Substance and Sexual Activity  Alcohol Use No     Social History   Substance and Sexual Activity  Drug Use No    Social History   Socioeconomic History   Marital status: Single    Spouse name: Not on file   Number of children: Not on file   Years of education: Not on file   Highest education level: Not on file  Occupational History   Not on file  Tobacco Use   Smoking status: Never   Smokeless tobacco: Never  Vaping Use   Vaping Use: Never used  Substance and Sexual Activity   Alcohol use: No   Drug use: No   Sexual activity: Not Currently  Other Topics Concern   Not on file  Social History Narrative   Not on file   Social Determinants of Health   Financial Resource Strain: Not on file  Food Insecurity: Food Insecurity Present (04/09/2022)   Hunger Vital Sign    Worried About Running Out of Food in the Last Year: Sometimes true    Ran Out of Food in the Last Year: Sometimes true  Transportation Needs: Unmet Transportation Needs (04/09/2022)   PRAPARE - Hydrologist (Medical): Yes    Lack of Transportation (Non-Medical): Yes  Physical Activity: Not on file  Stress: Not on file  Social Connections: Not on file    Sleep: Good  Appetite:  Good  Current Medications: Current Facility-Administered Medications  Medication Dose Route Frequency Provider Last Rate Last Admin   fluPHENAZine decanoate (PROLIXIN) injection 25 mg  25 mg Intramuscular Q14 Days Small, Brooke L, PA   25 mg at 06/10/22 1420   linaclotide (LINZESS) capsule 145 mcg  145 mcg Oral Daily Small, Brooke L, PA       OLANZapine (ZYPREXA) tablet 10 mg  10 mg Oral QHS Gwenlyn Found, Jason A, NP       pantoprazole (PROTONIX) EC tablet 40 mg  40 mg Oral Daily Small, Brooke L, PA       rosuvastatin (CRESTOR) tablet 40 mg  40 mg Oral Daily Small, Brooke L, PA   40 mg at 06/12/22 1000   Current Outpatient Medications  Medication Sig Dispense  Refill   linaclotide (LINZESS) 145 MCG CAPS capsule Take 1 capsule (145 mcg total) by mouth in the morning. 30 capsule 11   fluPHENAZine decanoate (PROLIXIN) 25 MG/ML injection Inject 25 mg into the muscle every 14 (fourteen) days. (Patient not taking: Reported on 06/10/2022)     meloxicam (MOBIC) 15 MG tablet Take 1 tablet (15 mg total) by mouth daily. (Patient not taking: Reported on 06/10/2022) 7 tablet 0   omeprazole (PRILOSEC) 20 MG capsule Take 1 capsule (20 mg total) by mouth in the morning. (Patient not taking: Reported on 06/10/2022) 30 capsule 11   rosuvastatin (CRESTOR) 40 MG tablet Take 1 tablet (40 mg  total) by mouth daily. (Patient not taking: Reported on 06/10/2022) 30 tablet 3    Lab Results: No results found for this or any previous visit (from the past 48 hour(s)).  Blood Alcohol level:  Lab Results  Component Value Date   ETH <10 06/10/2022   ETH <10 06/07/2022    Physical Findings:  CIWA:    COWS:     Musculoskeletal: Strength & Muscle Tone: within normal limits Gait & Station: normal Patient leans: N/A  Psychiatric Specialty Exam:  Presentation  General Appearance:  Appropriate for Environment  Eye Contact: Good  Speech: Clear and Coherent  Speech Volume: Normal  Handedness: Right   Mood and Affect  Mood: Euthymic  Affect: Appropriate   Thought Process  Thought Processes: Coherent  Descriptions of Associations:Intact  Orientation:Full (Time, Place and Person)  Thought Content:Delusions  History of Schizophrenia/Schizoaffective disorder:Yes  Duration of Psychotic Symptoms:Greater than six months  Hallucinations:Hallucinations: None  Ideas of Reference:Delusions  Suicidal Thoughts:Suicidal Thoughts: No  Homicidal Thoughts:Homicidal Thoughts: No   Sensorium  Memory: Immediate Fair  Judgment: Fair  Insight: Fair   Community education officer  Concentration: Fair  Attention Span: Good  Recall: Good  Fund of  Knowledge: Good  Language: Good   Psychomotor Activity  Psychomotor Activity: Psychomotor Activity: Normal   Assets  Assets: Social Support   Sleep  Sleep: Sleep: Good    Physical Exam: Physical Exam HENT:     Head: Normocephalic.  Pulmonary:     Effort: Pulmonary effort is normal.  Musculoskeletal:        General: Normal range of motion.  Skin:    General: Skin is warm.  Neurological:     Mental Status: She is alert.  Psychiatric:        Attention and Perception: Attention normal.        Mood and Affect: Mood normal.        Behavior: Behavior normal.        Thought Content: Thought content is delusional.        Cognition and Memory: Cognition normal.        Judgment: Judgment is inappropriate.    Review of Systems  Constitutional: Negative.   HENT: Negative.    Respiratory: Negative.    Musculoskeletal: Negative.   Psychiatric/Behavioral:         Continues to have delusional thoughts, that she can go back to the [facility], and that [name] is her legal guardian and he told her to only take [place] and not her other medications.    Blood pressure (!) 155/102, pulse 79, temperature 97.8 F (36.6 C), temperature source Oral, resp. rate 18, height 5' (1.524 m), weight 63 kg, SpO2 91 %. Body mass index is 27.13 kg/m.   Medical Decision Making: Zuly Belkin is a 51 yr old female who has a history of Schizophrenia and Metastaic Melanoma who presents to [facility] via [facility]  under IVC. Patient was recently discharged from [facility] on 06/08/22 for similar occurrence. Patient is alert and oriented x 4. Patient denies SI/HI/AVH, continue to recommend inpatient hospitalization for this patient. Patient was administered her Prolixin inj. 25 mg on 06/10/22 at 1420.   Attempted three times this morning to contact legal guardian, unable to reach her [place] regarding patient, legal guardian information in chart demographics, this provider left a  confidential voicemail on her work number in response to patient. Also placed a consult in for TOC to help with housing resources. Patient continues to be under  review with [facility] admissions regarding inpatient psychiatric treatment.   Disposition: Patient is psychiatrically cleared, consult to [facility] to assist with housing resources.   [place], PMHNP 06/12/2022, 1:59 PM

## 2022-06-13 ENCOUNTER — Encounter (HOSPITAL_COMMUNITY): Payer: Self-pay

## 2022-06-13 DIAGNOSIS — F209 Schizophrenia, unspecified: Secondary | ICD-10-CM | POA: Diagnosis not present

## 2022-06-13 NOTE — ED Notes (Signed)
Assumed care of pt. Pt sleeping. Pt in no apparent distress or pain. Sitter at bedside. Will continue to monitor.

## 2022-06-13 NOTE — ED Notes (Signed)
Pt refused vitals. Pt stated that the doctor told her that "we" did not need to do anything to her because she is being released this morning and to leave her sleep, that's all she needs. I asked 2x and pt said no again and began to get loud so I left her alone.

## 2022-06-13 NOTE — BH Assessment (Signed)
This Probation officer spoke to [facility] at [facility] ACT Team this date where patient is receiving services, who had initiated the IVC on patient prior to  arrival. Patient's case was discussed with Bolick who has been working with patient in the community and voices concerns in reference to patient's safety due to her continued mental decompensation which has resulted in patient not having housing due to her frequent altercations with staff at various hotels and other high risk behaviors to include patient knocking on neighbors doors at various times throughout the night and patient's non-compliance with treatment interventions. Bolick was informed that [facility] staff would continue to update his team once a disposition on the patient has been rendered. This Probation officer did attempt to contact patient's guardian this date [name] [phone] unsuccessfully as VM would not allow this writer to leave a HIPAA compliant message. Status pending as patient's progress will be monitored.

## 2022-06-13 NOTE — ED Notes (Signed)
Pt continues to refuse vitals

## 2022-06-13 NOTE — ED Notes (Signed)
RN was made aware that the patient was hallucinating. Per sitter the patient saw a witch at the door and requested security. RN went to check on the patient. The patient had no complaints at that time.

## 2022-06-13 NOTE — Discharge Instructions (Signed)
Shelter List:   [place], [place] Phone: [facility] has been providing emergency shelter to those in need of a permanent residence for over 35 years. The [facility] [facility] shelter plays an important role in our community.   There are many life events that can pull someone into a downward spiral towards poverty that is very difficult to get out of. Homelessness is a problem that can affect anyone of [place]. [facility] is a safe and comforting place to stay, especially if you have experienced the hardship of street life.   [facility] provides a single bed and bedding to 100 adult men and women. The shelter welcomes all who are in need of housing, no one in real need is turned away unless space is not available.   While staying at [facility], guests are offered more than just a bed for a night. Hot meals are provided and every guest has access to case management services. Case managers provide assistance with finding housing, employment, or other services that will help them gain stability. Continuous stay is based on availability, capacity, and progress towards goals.   To contact the front desk of [facility] please call   [phone] ext [phone] or ext. [phone].   [facility] [address] [place], [place] [address] Phone: [phone]   [facility] (Women only) [address] [place], [place] [address] Phone: [place] [address]. [place], [place] [address] Phone: [phone]   [place]: [phone]. [address] [place],  [address] Phone: [phone]   [facility]flow Shelter [address], [place],  [address] Check in at 6:00PM for placement at a local shelter) Phone: [phone]     [facility]          -(Please Call) Phone: [phone]   [facility] Eligibility:  Must be drug and alcohol free for at least 14 days or more at the time of application. This program serves males.  Houses Architectural technologist, Futures trader who serve six-month terms.  Houses are financially self-supporting; members split house expenses, which average $90.00 to $130.00 per person per week.  Any Resident who relapses must be immediately expelled. Call:  [phone]

## 2022-06-13 NOTE — ED Notes (Signed)
Pt ambulated to the bathroom without any issues. 

## 2022-06-13 NOTE — ED Notes (Signed)
RN attempted to give pt medication pt stated "I am not a crazy person I am a child of god I do not need to be here"

## 2022-06-13 NOTE — ED Provider Notes (Signed)
Emergency Medicine Observation Re-evaluation Note  Kaitlyn Duncan is a 51 y.o. female, seen on rounds today.  Pt initially presented to the ED for complaints of IVC Currently, the patient is resting in NAD.  Physical Exam  BP (!) 155/102 (BP Location: Right Arm)   Pulse 79   Temp 97.8 F (36.6 C) (Oral)   Resp 18   Ht 5' (1.524 m)   Wt 63 kg   SpO2 91%   BMI 27.13 kg/m  Physical Exam General: Appears to be resting comfortably in bed, no acute distress. Cardiac: Regular rate, normal heart rate, non-emergent blood pressure for this morning's vitals. Lungs: No increased work of breathing.  Equal chest rise appreciated Psych: Calm, asleep in bed.   ED Course / MDM  EKG:EKG Interpretation  Date/Time:  Thursday June 10 2022 11:34:50 EST Ventricular Rate:  88 PR Interval:  143 QRS Duration: 100 QT Interval:  370 QTC Calculation: 448 R Axis:   103 Text Interpretation: Sinus rhythm Right axis deviation ST elev, probable normal early repol pattern No significant change since last tracing Confirmed by Lacretia Leigh ([id number]) on 06/10/2022 11:53:54 AM  I have reviewed the labs performed to date as well as medications administered while in observation.   Plan  Current plan is for psychiatric admission.    Tretha Sciara, MD 06/13/22 [phone]

## 2022-06-13 NOTE — Progress Notes (Signed)
CSW provided resources in AVS listed below:   Shelter List:   [place], [place] Phone: [facility] has been providing emergency shelter to those in need of a permanent residence for over 35 years. The [employer] shelter plays an important role in our community.   There are many life events that can pull someone into a downward spiral towards poverty that is very difficult to get out of. Homelessness is a problem that can affect anyone of [place]. [facility] is a safe and comforting place to stay, especially if you have experienced the hardship of street life.   [facility] provides a single bed and bedding to 100 adult men and women. The shelter welcomes all who are in need of housing, no one in real need is turned away unless space is not available.   While staying at [facility], guests are offered more than just a bed for a night. Hot meals are provided and every guest has access to case management services. Case managers provide assistance with finding housing, employment, or other services that will help them gain stability. Continuous stay is based on availability, capacity, and progress towards goals.   To contact the front desk of [facility] please call   [phone] ext [phone] or ext. [phone].   [facility] [address] [place], [place] [address] Phone: [phone]   [facility] (Women only) [address] [place], [place] [address] Phone: [place]. [place], [place] [address] Phone: [phone]   [place]: [phone]. [address] [place], [place] [address] Phone: [phone]   [facility]flow [facility] [address] [place], [place] [address] Check in at 6:00PM for placement at a local shelter) Phone: [phone]     [facility]          -(Please Call) Phone: [phone]   [facility] Eligibility:  Must be drug and alcohol free for at least 14 days or more at the time of application. This program serves males.  Houses Architectural technologist, Futures trader who serve six-month terms.  Houses are financially self-supporting; members split house expenses, which average $90.00 to $130.00 per person per week.  Any Resident who relapses must be immediately expelled. Call:  [place], MSW, [place], LCAS Phone: [phone] Disposition/TOC

## 2022-06-14 MED ORDER — LORAZEPAM 2 MG/ML IJ SOLN
2.0000 mg | Freq: Once | INTRAMUSCULAR | Status: DC
  Filled 2022-06-14: qty 1

## 2022-06-14 MED ORDER — OMEPRAZOLE 20 MG PO CPDR: 20.0000 mg | DELAYED_RELEASE_CAPSULE | Freq: Every morning | ORAL | 11 refills | Status: DC

## 2022-06-14 MED ORDER — LINACLOTIDE 145 MCG PO CAPS: 145.0000 ug | ORAL_CAPSULE | Freq: Every morning | ORAL | 11 refills | Status: DC

## 2022-06-14 MED ORDER — FLUPHENAZINE DECANOATE 25 MG/ML IJ SOLN: 25.0000 mg | INTRAMUSCULAR | 0 refills | Status: DC

## 2022-06-14 MED ORDER — ROSUVASTATIN CALCIUM 40 MG PO TABS: 40.0000 mg | ORAL_TABLET | Freq: Every day | ORAL | 3 refills | Status: DC

## 2022-06-14 NOTE — ED Provider Notes (Signed)
Emergency Medicine Observation Re-evaluation Note  Kaitlyn Duncan is a 51 y.o. female, seen on rounds today.  Pt initially presented to the ED for complaints of IVC Currently, the patient is resting in bed.  Request to go back to her prior living conditions.  Physical Exam  BP (!) 155/102 (BP Location: Right Arm)   Pulse 79   Temp 97.8 F (36.6 C) (Oral)   Resp 18   Ht 1.524 m (5')   Wt 63 kg   SpO2 91%   BMI 27.13 kg/m  Physical Exam   ED Course / MDM  EKG:EKG Interpretation  Date/Time:  Thursday June 10 2022 11:34:50 EST Ventricular Rate:  88 PR Interval:  143 QRS Duration: 100 QT Interval:  370 QTC Calculation: 448 R Axis:   103 Text Interpretation: Sinus rhythm Right axis deviation ST elev, probable normal early repol pattern No significant change since last tracing Confirmed by Lacretia Leigh ([id number]) on 06/10/2022 11:53:54 AM  I have reviewed the labs performed to date as well as medications administered while in observation.  Recent changes in the last 24 hours include none.  Plan  Current plan is for [facility] working with prior liver conditions to find a place for patient to be discharged to.    Lacretia Leigh, MD 06/14/22 [phone]

## 2022-06-14 NOTE — ED Provider Notes (Signed)
Social work has found a hotel for the patient to go to.  However her guardian is not able to pick her up tonight.  It was not felt that she was safe to leave in a cab.  Will reassess whether her guardian can pick her up tomorrow.  Her prescriptions were sent to the [facility] where her ACT team will pick them up.   Malvin Johns, MD 06/14/22 1745

## 2022-06-14 NOTE — ED Notes (Signed)
Pt. Refused vitals.

## 2022-06-14 NOTE — Consult Note (Signed)
Provider reached out to Patient's legal guardian [name] and we reviewed patient's care, diagnosis and medications.  She was informed that patient is Psychiatrically cleared as she received Prolixin injection on December 7th and next dose is December 21'st.  Patient is supposed to take Olanzapine for 14 days and stop but patient is not taking any oral dose except for Linzess for constipation.  Legal Guardian states that patient cannot go back to her former hotel room because she is not allowed back there due to her behavior.  MS [place] Silos states she will be looking for a new accomodation for patient but will not pick patient up until she finds a place for her to stay.  Patient keeps stating that "the Reita Cliche tells me that the hotel already taken me back"  She also added that her Legal Guardian is "Jesus".

## 2022-06-14 NOTE — Progress Notes (Signed)
CSW reached out to the Guardian to express the NP and MD's concern. As they felt that the patient would be unsafe going by cab or safe transport. The providers would like the patient to be picked up by the guardian or the act team. The guardian was made aware that the patient will stay at the hospital until we all can create a safe DC plan for this patient. TOC will continue to follow the DC plan as we need to.

## 2022-06-14 NOTE — ED Notes (Signed)
Pt has been calm threwout night pt stayed in room and was polite with staff but refused medications and vitals

## 2022-06-14 NOTE — Progress Notes (Addendum)
This CSW left a HIPAA Compliant voicemail for legal guardian.   Addend @ 8:24 AM  This CSW received a call back from Legal Guardian, Kaitlyn Duncan, who is aware that the pt cannot return to the motel. Kaitlyn Duncan states she does not have any other housing options for the pt at this time. She does want to speak with a provider to discuss what care the pt received while at the emergency department. She specifically asked about medication adjustments and how the pt is psychiatrically cleared. This CSW will reach out to pysch provider via secure chat to request they contact her.   Kaitlyn Duncan states she can be reached at her cell phone: [phone] before 10 AM because she has back to back meetings.   Addend @ 9:37 AM This CSW inquired about DSS paying for the pt to go to another hotel. Kaitlyn Duncan, Legal Guardian, states that is an option. Kaitlyn Duncan will call me once she speaks with Psych NP, Kaitlyn Duncan.   Addend @ 9:58 AM Per Kaitlyn Duncan, LG will not be picking the pt up until there is somewhere for the pt to go. This CSW attempted to contact LG but will wait for a call back following Lg's morning meeting.   Addend @ 11:18 AM This CSW has attempted to contact the pt on her work phone and cell phone. Left HIPAA Compliant voicemail on work phone.

## 2022-06-14 NOTE — ED Notes (Signed)
Pt in hallway stating she has been talking to [name] and he told her that a policeman is out front to take her back to the hotel. Pt informed that she cannot be discharged until someone lets this RN know. Pt states "[name] help you", and went back in to room

## 2022-06-14 NOTE — Progress Notes (Addendum)
Vincente Liberty, legal guardian, asked that staff receive 3 preferred hotel options from the pt. Per Estill Bamberg, RN, the pt only keeps mentioning [facility] which is the hotel she came from and is unable to return to. Vincente Liberty states she will inform TOC of where the pt is going so we can provide a cab. Adrienne asked if the hospital can provide a cab due to her not being able to pick the pt up.   Vincente Liberty is asking that the pt's prescriptions are sent to [facility] and that her AVS is emailed to [email or web address].   Adrienne cell: [phone]

## 2022-06-14 NOTE — ED Notes (Addendum)
Pt provided with lunch tray, refused to eat anything except for vegetable soup. Pt also said she can't drink tea because " My Reita Cliche and Oswaldo Milian would not want me to"

## 2022-06-15 ENCOUNTER — Emergency Department (HOSPITAL_COMMUNITY)
Admission: EM | Admit: 2022-06-15 | Discharge: 2022-06-18 | Disposition: A | Discharge status: Other Acute Inpatient Hospital | Payer: Medicare Other | Attending: Emergency Medicine | Admitting: Emergency Medicine

## 2022-06-15 DIAGNOSIS — R451 Restlessness and agitation: Secondary | ICD-10-CM | POA: Insufficient documentation

## 2022-06-15 DIAGNOSIS — F209 Schizophrenia, unspecified: Secondary | ICD-10-CM | POA: Diagnosis not present

## 2022-06-15 DIAGNOSIS — F203 Undifferentiated schizophrenia: Secondary | ICD-10-CM | POA: Diagnosis not present

## 2022-06-15 DIAGNOSIS — Z1152 Encounter for screening for COVID-19: Secondary | ICD-10-CM | POA: Insufficient documentation

## 2022-06-15 DIAGNOSIS — N3281 Overactive bladder: Secondary | ICD-10-CM | POA: Diagnosis present

## 2022-06-15 LAB — URINALYSIS, ROUTINE W REFLEX MICROSCOPIC
Bacteria, UA: NONE SEEN
Bilirubin Urine: NEGATIVE
Glucose, UA: NEGATIVE mg/dL
Hgb urine dipstick: NEGATIVE
Ketones, ur: 20 mg/dL — AB
Nitrite: NEGATIVE
Protein, ur: 30 mg/dL — AB
Specific Gravity, Urine: 1.019 (ref 1.005–1.030)
pH: 5 (ref 5.0–8.0)

## 2022-06-15 MED ORDER — ROSUVASTATIN CALCIUM 20 MG PO TABS
40.0000 mg | ORAL_TABLET | Freq: Every day | ORAL | Status: DC
  Administered 2022-06-15 – 2022-06-18 (×2): 40 mg via ORAL
  Filled 2022-06-15 (×3): qty 2

## 2022-06-15 MED ORDER — ZIPRASIDONE MESYLATE 20 MG IM SOLR
10.0000 mg | Freq: Once | INTRAMUSCULAR | Status: AC
  Administered 2022-06-15: 10 mg via INTRAMUSCULAR
  Filled 2022-06-15: qty 20

## 2022-06-15 MED ORDER — FLUPHENAZINE DECANOATE 25 MG/ML IJ SOLN: 25.0000 mg | INTRAMUSCULAR | Status: DC

## 2022-06-15 MED ORDER — PANTOPRAZOLE SODIUM 40 MG PO TBEC
40.0000 mg | DELAYED_RELEASE_TABLET | Freq: Every day | ORAL | Status: DC
  Administered 2022-06-15 – 2022-06-18 (×2): 40 mg via ORAL
  Filled 2022-06-15 (×2): qty 1

## 2022-06-15 MED ORDER — STERILE WATER FOR INJECTION IJ SOLN
INTRAMUSCULAR | Status: AC
  Filled 2022-06-15: qty 10

## 2022-06-15 MED ORDER — LINACLOTIDE 145 MCG PO CAPS
145.0000 ug | ORAL_CAPSULE | Freq: Every morning | ORAL | Status: DC
  Administered 2022-06-16 – 2022-06-17 (×2): 145 ug via ORAL
  Filled 2022-06-15 (×3): qty 1

## 2022-06-15 NOTE — Progress Notes (Addendum)
This CSW spoke with [facility] supervisor and contacted EDP, Dr. Laverta Baltimore, to update on situation after speaking with legal guardian. Both agree with safe transport providing transport to [facility] that was set up by the pt's legal guardian. This CSW has informed the pt's legal guardian and will email her the AVS. This CSW confirmed that IVC is rescinded with Dr. Laverta Baltimore. TOC signing off.

## 2022-06-15 NOTE — ED Provider Notes (Signed)
Received handoff, pending urinalysis for medical clearance.  Physical Exam  BP 126/87 (BP Location: Right Arm)   Pulse 85   Temp 98.9 F (37.2 C) (Oral)   Resp 18   SpO2 100%   Physical Exam Vitals and nursing note reviewed.  Constitutional:      General: She is not in acute distress.    Appearance: She is well-developed.  HENT:     Head: Normocephalic and atraumatic.  Eyes:     Conjunctiva/sclera: Conjunctivae normal.  Cardiovascular:     Rate and Rhythm: Normal rate and regular rhythm.     Heart sounds: No murmur heard. Pulmonary:     Effort: Pulmonary effort is normal. No respiratory distress.     Breath sounds: Normal breath sounds.  Abdominal:     Palpations: Abdomen is soft.     Tenderness: There is no abdominal tenderness.  Musculoskeletal:        General: No swelling.     Cervical back: Neck supple.  Skin:    General: Skin is warm and dry.     Capillary Refill: Capillary refill takes less than 2 seconds.  Neurological:     Mental Status: She is alert.  Psychiatric:        Mood and Affect: Mood normal.     Procedures  Procedures  ED Course / MDM    Medical Decision Making Amount and/or Complexity of Data Reviewed Labs: ordered.  Risk Prescription drug management.   Urinalysis is unremarkable, to be evaluated by TTS for further disposition.       Osvaldo Shipper, PA 06/15/22 1636    Leanord Asal K, DO 06/15/22 1707

## 2022-06-15 NOTE — ED Provider Notes (Signed)
Emergency Medicine Observation Re-evaluation Note  Kaitlyn Duncan is a 51 y.o. female, seen on rounds today.  Pt initially presented to the ED for complaints of IVC Currently, the patient is awaiting discharge  Physical Exam  BP (!) 155/102 (BP Location: Right Arm)   Pulse 79   Temp 97.8 F (36.6 C) (Oral)   Resp 18   Ht 5' (1.524 m)   Wt 63 kg   SpO2 91%   BMI 27.13 kg/m  Physical Exam General: Calm Cardiac: Well perfused Lungs: even respirations  Psych: calm  ED Course / MDM  EKG:EKG Interpretation  Date/Time:  Thursday June 10 2022 11:34:50 EST Ventricular Rate:  88 PR Interval:  143 QRS Duration: 100 QT Interval:  370 QTC Calculation: 448 R Axis:   103 Text Interpretation: Sinus rhythm Right axis deviation ST elev, probable normal early repol pattern No significant change since last tracing Confirmed by Lacretia Leigh ([id number]) on 06/10/2022 11:53:54 AM  I have reviewed the labs performed to date as well as medications administered while in observation.  Recent changes in the last 24 hours include psych clear and hotel accomodation made. Patient waiting on transport.  Plan  Current plan is for d/c to hotel today but awaiting transportation.  09:07 AM  In coordination with case management the patient will be transported to her new hotel by safe transport.  I updated the patient regarding this and she is in agreement.  IVC has been rescinded.  Patient appears stable and appropriate for discharge from the emergency department at this time.    Margette Fast, MD 06/15/22 [phone]

## 2022-06-15 NOTE — ED Notes (Signed)
Pt refused her meal at this time

## 2022-06-15 NOTE — ED Notes (Signed)
Patient refused vitals and meds

## 2022-06-15 NOTE — ED Notes (Signed)
Patient refused vitals.

## 2022-06-15 NOTE — BH Assessment (Signed)
Per Charmaine Downs, NP, "Reevaluate in am for appropriate Disposition". Patient to remain in the ED overnight for am psych evaluation.

## 2022-06-15 NOTE — ED Notes (Signed)
Pt informed she would be discharged to her home address provided by guardian, Pt had manic episode stating she did not live there and wanted to return to another location, Pt redirected and attempted to elope several times. Staff directed Pt back to bed and PT continued verbal assaults and uncooperative combative behavior with staff stating we were all "spawns of Satan". MD notified of manic behavior, Geodon and Psych consult ordered.

## 2022-06-15 NOTE — ED Provider Triage Note (Signed)
Emergency Medicine Provider Triage Evaluation Note  Kaitlyn Duncan , a 51 y.o. female  was evaluated in triage.  Pt complains of needing urethra checked. Endorses increased frequency, urgencyx10 years. No dysuria or hematuria.  Review of Systems  Positive: Urinary urgency, frequency Negative: dysuria  Physical Exam  There were no vitals taken for this visit. Gen:   Awake, no distress   Resp:  Normal effort  MSK:   Moves extremities without difficulty   Medical Decision Making  Medically screening exam initiated at 12:57 PM.  Appropriate orders placed.  Kaitlyn Duncan was informed that the remainder of the evaluation will be completed by another provider, this initial triage assessment does not replace that evaluation, and the importance of remaining in the ED until their evaluation is complete.     Osvaldo Shipper, [place] 06/15/22 1259

## 2022-06-15 NOTE — Consult Note (Addendum)
[facility] ED ASSESSMENT   Reason for Consult:  Psychiatry evaluation Referring Physician:  ER Physician Patient Identification: Kaitlyn Duncan MRN:  [id number] ED Chief Complaint: Undifferentiated schizophrenia [facility])  Diagnosis:  Principal Problem:   Undifferentiated schizophrenia [facility])   ED Assessment Time Calculation: Start Time: 1645 Stop Time: 1710 Total Time in Minutes (Assessment Completion): 25   Subjective:   Kaitlyn Duncan is a 51 y.o. female patient admitted with hx of Schizophrenia unspecified and Melanoma Cancer who was discharged back to a motel to stay but went to the wrong hotel and was brought back to the ER.Marland Kitchen  Patient went to [facility] instead of the new hotel arranged by her Legal Guardian.  [place] does not want her back there due to destroying things and agitation.  HPI:  Patient came back to ER angry stating she did not want to go to the new Hotel arranged by her Legal Guardian.  She went to [facility] and was rejected over there.  When patient came back to the ER she was angry and disappointed.  She became disruptive in the Unit and started yelling.  She received 10 mg Injection Geodon and slept for 30 minutes.  She woke up and again insisted that she was going back to [facility].  This provider called the [facility] and spoke to the Supervisor who said patient is not allowed in their hotel.  When patient was informed that she is not allowed at [facility] she became argumentative.  Provider then handed the phone to patient.  She was told that she is not allowed to come back to that hotel and she started arguing with the supervisor of the hotel. Patient is due for her next dose of Prolixin Inject December 21 st.  Patient is not taking oral medications.  She is fixated on religion and believes [name] will heal her and she does not need medications.  We will speak with her ACT Team and legal Guardian regarding appropriate disposition.  Past Psychiatric  History: Schizophrenia, unspecified  Risk to Self or Others: Is the patient at risk to self? No Has the patient been a risk to self in the past 6 months? No Has the patient been a risk to self within the distant past? No Is the patient a risk to others? No Has the patient been a risk to others in the past 6 months? No Has the patient been a risk to others within the distant past? No  [place] Scale:  [place] ED from 06/15/2022 in [place] DEPT ED from 06/07/2022 in [facility] ED from 05/25/2022 in [place] DEPT  C-SSRS RISK CATEGORY No Risk No Risk No Risk       AIMS:  , , ,  ,   ASAM:    Substance Abuse:     Past Medical History:  Past Medical History:  Diagnosis Date   Allergy    Anemia    Gallstones    GERD (gastroesophageal reflux disease)    Hemorrhoids    melanoma with met dz dx'd 01/2018   brain and adrenal   Seasonal allergies    Stroke [facility])     Past Surgical History:  Procedure Laterality Date   BIOPSY  01/28/2018   Procedure: BIOPSY;  Surgeon: Laurence Spates, MD;  Location: [facility] ENDOSCOPY;  Service: Endoscopy;;   BREAST BIOPSY Left    ESOPHAGOGASTRODUODENOSCOPY N/A 01/28/2018   Procedure: ESOPHAGOGASTRODUODENOSCOPY (EGD);  Surgeon: Laurence Spates,  MD;  Location: [facility] ENDOSCOPY;  Service: Endoscopy;  Laterality: N/A;   MOUTH SURGERY     OVARY SURGERY     removed "something"   Family History:  Family History  Problem Relation Age of Onset   Melanoma Mother    Hypertension Father    Breast cancer Maternal Aunt    Breast cancer Paternal Aunt    Diabetes Paternal Aunt    Breast cancer Maternal Grandmother    Breast cancer Paternal Grandmother    Diabetes Paternal Grandmother    Colon cancer Neg Hx    Esophageal cancer Neg Hx    Pancreatic cancer Neg Hx    Stomach cancer Neg Hx    Liver disease Neg Hx    Rectal cancer Neg Hx    Family Psychiatric  History:  unknown Social History:  Social History   Substance and Sexual Activity  Alcohol Use No     Social History   Substance and Sexual Activity  Drug Use No    Social History   Socioeconomic History   Marital status: Single    Spouse name: Not on file   Number of children: Not on file   Years of education: Not on file   Highest education level: Not on file  Occupational History   Not on file  Tobacco Use   Smoking status: Never   Smokeless tobacco: Never  Vaping Use   Vaping Use: Never used  Substance and Sexual Activity   Alcohol use: No   Drug use: No   Sexual activity: Not Currently  Other Topics Concern   Not on file  Social History Narrative   Not on file   Social Determinants of Health   Financial Resource Strain: Not on file  Food Insecurity: Food Insecurity Present (04/09/2022)   Hunger Vital Sign    Worried About Running Out of Food in the Last Year: Sometimes true    Ran Out of Food in the Last Year: Sometimes true  Transportation Needs: Unmet Transportation Needs (04/09/2022)   PRAPARE - Hydrologist (Medical): Yes    Lack of Transportation (Non-Medical): Yes  Physical Activity: Not on file  Stress: Not on file  Social Connections: Not on file   Additional Social History:    Allergies:  No Known Allergies  Labs:  Results for orders placed or performed during the hospital encounter of 06/15/22 (from the past 48 hour(s))  Urinalysis, Routine w reflex microscopic Urine, Clean Catch     Status: Abnormal   Collection Time: 06/15/22  1:32 PM  Result Value Ref Range   Color, Urine YELLOW YELLOW   APPearance CLEAR CLEAR   Specific Gravity, Urine 1.019 1.005 - 1.030   pH 5.0 5.0 - 8.0   Glucose, UA NEGATIVE NEGATIVE mg/dL   Hgb urine dipstick NEGATIVE NEGATIVE   Bilirubin Urine NEGATIVE NEGATIVE   Ketones, ur 20 (A) NEGATIVE mg/dL   Protein, ur 30 (A) NEGATIVE mg/dL   Nitrite NEGATIVE NEGATIVE   Leukocytes,Ua SMALL (A)  NEGATIVE   RBC / HPF 0-5 0 - 5 RBC/hpf   WBC, UA 6-10 0 - 5 WBC/hpf   Bacteria, UA NONE SEEN NONE SEEN   Squamous Epithelial / LPF 0-5 0 - 5   Mucus PRESENT    Hyaline Casts, UA PRESENT     Comment: Performed at [facility], [place] [address] [place], [place] [address]    Current Facility-Administered Medications  Medication Dose Route Frequency Provider Last  Rate Last Admin   fluPHENAZine decanoate (PROLIXIN) injection 25 mg  25 mg Intramuscular Q14 Days Small, Brooke L, PA       [START ON 06/16/2022] linaclotide (LINZESS) capsule 145 mcg  145 mcg Oral q AM Small, Brooke L, PA       pantoprazole (PROTONIX) EC tablet 40 mg  40 mg Oral Daily Small, Brooke L, PA       rosuvastatin (CRESTOR) tablet 40 mg  40 mg Oral Daily Small, Brooke L, PA       Current Outpatient Medications  Medication Sig Dispense Refill   fluPHENAZine decanoate (PROLIXIN) 25 MG/ML injection Inject 1 mL (25 mg total) into the muscle every 14 (fourteen) days. 5 mL 0   linaclotide (LINZESS) 145 MCG CAPS capsule Take 1 capsule (145 mcg total) by mouth in the morning. 30 capsule 11   meloxicam (MOBIC) 15 MG tablet Take 1 tablet (15 mg total) by mouth daily. (Patient not taking: Reported on 06/10/2022) 7 tablet 0   omeprazole (PRILOSEC) 20 MG capsule Take 1 capsule (20 mg total) by mouth in the morning. 30 capsule 11   rosuvastatin (CRESTOR) 40 MG tablet Take 1 tablet (40 mg total) by mouth daily. 30 tablet 3    Musculoskeletal: Strength & Muscle Tone: within normal limits Gait & Station: normal Patient leans: Front   Psychiatric Specialty Exam: Presentation  General Appearance:  Casual  Eye Contact: Good  Speech: Clear and Coherent; Normal Rate  Speech Volume: Normal  Handedness: Right   Mood and Affect  Mood: Anxious; Irritable; Labile  Affect: Congruent; Labile   Thought Process  Thought Processes: Disorganized; Irrevelant (fixed on religion)  Descriptions of  Associations:Tangential  Orientation:Partial  Thought Content:Illogical; Perseveration  History of Schizophrenia/Schizoaffective disorder:Yes  Duration of Psychotic Symptoms:Greater than six months  Hallucinations:Hallucinations: None  Ideas of Reference:None  Suicidal Thoughts:Suicidal Thoughts: No  Homicidal Thoughts:Homicidal Thoughts: No   Sensorium  Memory: Immediate Fair; Recent Fair; Remote Fair  Judgment: Impaired  Insight: Lacking   Executive Functions  Concentration: Fair  Attention Span: Good  Recall: [employer] of Knowledge: Fair  Language: Fair   Psychomotor Activity  Psychomotor Activity: Psychomotor Activity: Normal   Assets  Assets: Communication Skills; Social Support    Sleep  Sleep: Sleep: Fair   Physical Exam: Physical Exam Vitals and nursing note reviewed.  Constitutional:      Comments: Disheveled, unkempt  HENT:     Head: Normocephalic and atraumatic.     Nose: Nose normal.  Cardiovascular:     Rate and Rhythm: Normal rate and regular rhythm.  Pulmonary:     Effort: Pulmonary effort is normal.  Musculoskeletal:        General: Normal range of motion.     Cervical back: Normal range of motion.  Skin:    General: Skin is warm and dry.  Neurological:     Mental Status: She is alert and oriented to person, place, and time.    Review of Systems  Constitutional: Negative.   HENT: Negative.    Eyes: Negative.   Respiratory: Negative.    Cardiovascular: Negative.   Gastrointestinal: Negative.   Genitourinary: Negative.   Musculoskeletal: Negative.   Skin: Negative.   Neurological: Negative.   Endo/Heme/Allergies: Negative.   Psychiatric/Behavioral:  The patient is nervous/anxious.    Blood pressure 126/87, pulse 85, temperature 98.9 F (37.2 C), temperature source Oral, resp. rate 18, SpO2 100 %. There is no height or weight on file to calculate BMI.  Medical Decision Making: Patient is disorganized,  confused and not compliant with her Psychotropic medication.  Patient remains argumentative, believes her legal guardian is [name] and that she does not need medications.  She received Prolixin Injection December 7th and next dose is due December 21 st.  Patient is refusing Hotel arranged by her legal guardian and where she plans to go the [place] does not want her back.  Patient will be evaluated in am and legal Guardian will determine where patient will be going.  Patient may need inpatient Psychiatry unit admission where Forced Medication protocol can be initiated.  Problem 1: Schizophrenia, Unspecified  Disposition:  Reevaluate in am for appropriate Disposition.  Delfin Gant, NP-PMHNP-BC 06/15/2022 5:17 PM

## 2022-06-15 NOTE — ED Notes (Signed)
Pt refused vitals, RN aware

## 2022-06-15 NOTE — ED Notes (Signed)
Pt refused vitals again RN notified

## 2022-06-15 NOTE — ED Notes (Addendum)
Spoke to Kaitlyn Duncan at [phone] and permission given to transport Pt to [place] @ [address] Lady Gary. Rm 144

## 2022-06-15 NOTE — ED Triage Notes (Signed)
[facility], recently discharged reporting that she "needs urethra checked". Denies burning, trouble urinating, or other concerns.

## 2022-06-15 NOTE — ED Notes (Addendum)
AVS discussed with pt, and faxed by SW to guardian

## 2022-06-15 NOTE — ED Notes (Signed)
Patient refused actual vitals  - vitals in from EMS.

## 2022-06-15 NOTE — Progress Notes (Addendum)
This CSW contacted the pt's legal guardian, Kaitlyn Duncan, who states "I'm not sure why the hospital is saying that it is not a safe discharge for her to go by cab or safe transport. I am not coming to pick her up. I don't like that it's being made to seem like I am not doing my job. My job is to set things up for her and I have. I went to the hotel and checked her in myself. I'm not coming to pick her up. She is an adult and I have a caseload of 30 people." This CSW informed Kaitlyn Duncan that we will attempt to contact [facility] (who is the pt's ACTT) to pick her up and transport her to the hotel. Kaitlyn Duncan is in agreement with that plan but states "Hopefully [facility] will want to get her because last time they tried to pick her up she showed out." This CSW will attempt to try [facility] shortly.   LG also informed this CSW that the pt is familiar with navigating hotels as this has been her living arrangements for years now.  Addend @ 8:37 AM This CSW attempted to contact [facility] to inquire about them transporting the pt to [facility] [address] [place], [place] [phone] which was orchestrated by the pt's legal guardian and paid for by DSS.

## 2022-06-15 NOTE — ED Provider Notes (Signed)
[facility] DEPT Provider Note   CSN: [id number] Arrival date & time: 06/15/22  1247     History  Chief Complaint  Patient presents with   Follow-up    Kaitlyn Duncan is a 51 y.o. female who presents to ED c/o "overactive bladder" with urinary frequency that has been going on for 10+ years. Reports occasional urinary incontinence with last episode over a week ago but this is not new for her. Pt is very uncooperative on interview and exam with both myself and RN. She does deny abdominal pain, nausea, vomiting, dysuria, hematuria, vaginal or urethral discharge, itching, or fever. When asked about history of urinary tract infections, she states "I don't know." With further attempts at questioning, she does not verbalize a response or gesture to suggest an answer after stating she has told us "all we need to know." Again, history very limited due to pt's agitation and lack of cooperation.      Home Medications Prior to Admission medications   Medication Sig Start Date End Date Taking? Authorizing Provider  fluPHENAZine decanoate (PROLIXIN) 25 MG/ML injection Inject 1 mL (25 mg total) into the muscle every 14 (fourteen) days. 06/14/22   Malvin Johns, MD  linaclotide (LINZESS) 145 MCG CAPS capsule Take 1 capsule (145 mcg total) by mouth in the morning. 06/14/22 06/09/23  Malvin Johns, MD  meloxicam (MOBIC) 15 MG tablet Take 1 tablet (15 mg total) by mouth daily. Patient not taking: Reported on 06/10/2022 05/25/22   Orpah Greek, MD  omeprazole (PRILOSEC) 20 MG capsule Take 1 capsule (20 mg total) by mouth in the morning. 06/14/22   Malvin Johns, MD  rosuvastatin (CRESTOR) 40 MG tablet Take 1 tablet (40 mg total) by mouth daily. 06/14/22   Malvin Johns, MD      Allergies    Patient has no known allergies.    Review of Systems   Review of Systems  Unable to perform ROS: Other  Uncooperative patient  Physical Exam Updated Vital  Signs BP 126/87 (BP Location: Right Arm)   Pulse 85   Temp 98.9 F (37.2 C) (Oral)   Resp 18   SpO2 100%  Physical Exam Vitals and nursing note reviewed.  Constitutional:      General: She is not in acute distress.    Appearance: She is not ill-appearing, toxic-appearing or diaphoretic.     Comments: Appears older than stated age  HENT:     Head: Normocephalic and atraumatic.     Mouth/Throat:     Mouth: Mucous membranes are moist.  Eyes:     Conjunctiva/sclera: Conjunctivae normal.  Cardiovascular:     Rate and Rhythm: Normal rate and regular rhythm.     Heart sounds: Normal heart sounds. No murmur heard. Pulmonary:     Effort: Pulmonary effort is normal. No respiratory distress.     Breath sounds: Normal breath sounds. No wheezing, rhonchi or rales.  Abdominal:     General: Abdomen is flat. There is no distension.     Palpations: Abdomen is soft.     Tenderness: There is no abdominal tenderness. There is no guarding or rebound.  Genitourinary:    Comments: Patient is in hallway bed so unable to proceed with GU exam but with complaint provided (though limited due to uncooperative patient) no indication for exam at this time Musculoskeletal:        General: Normal range of motion.     Cervical back: Neck supple.  Right lower leg: No edema.     Left lower leg: No edema.  Skin:    General: Skin is warm and dry.     Capillary Refill: Capillary refill takes less than 2 seconds.     Coloration: Skin is not pale.  Neurological:     Mental Status: She is alert. Mental status is at baseline.  Psychiatric:        Mood and Affect: Affect is angry.        Speech: She is noncommunicative (intermittently).        Behavior: Behavior is uncooperative and agitated.     ED Results / Procedures / Treatments   Labs (all labs ordered are listed, but only abnormal results are displayed) Labs Reviewed  URINALYSIS, ROUTINE W REFLEX MICROSCOPIC    EKG None  Radiology No results  found.  Procedures None  Medications Ordered in ED Medications  ziprasidone (GEODON) injection 10 mg (10 mg Intramuscular Given 06/15/22 1442)  sterile water (preservative free) injection (  Given 06/15/22 1443)    ED Course/ Medical Decision Making/ A&P                           Medical Decision Making Amount and/or Complexity of Data Reviewed Labs: ordered. Decision-making details documented in ED Course.  Risk Prescription drug management.   Pt returns to ED after discharge earlier today. On initial interview, pt very uncooperative and only given symptom is urinary frequency that has been going on for 10+ years. Pt easily agitated with further questioning and mostly refused to provide additional information. When approached again on re-exam, she states she came back here because after discharge this morning she was taken to a new hotel than the one she previously stayed at [facility]) and she did not want to go there so she came back to ED. She remains easily agitated and uncooperative with most questioning. Discussion with discharging MD from earlier today as well as case management reveals pt no longer allowed at [facility] and her legal guardian arranged for her to stay at new hotel which is where she will need to be discharged to. Attempt to contact pt's legal guardian, Vincente Liberty, for transport unsuccessful and unable to leave voicemail due to full mailbox. Primary RN and case management aware. Safe transport back to hotel will be arranged. UA ordered for completeness sake but pt with benign abdominal exam, no acute complaints, and stable vital signs so very low suspicion for acute urinary tract infection. Discussed case with attending MD who agreed with management and plan to discharge pt (see below).   While pt still in ED, she became increasingly volatile, wandering hallways, screaming that staff are "witches and warlocks holding her hostage" and that "Jesus is her guardian" and  "we need to talk to [facility] because he sent her here to open her up and have her urethra extensively examined." Required multiple attempts at redirection by several members of ED staff to get pt back to stretcher where she continued screaming irrationally, trying to get through staff barricade to continue wandering and [facility] ED, and with pt now severely illogical, not redirectable, and with unstable housing situation it was deemed pt is no longer safe to be discharged as previously planned. She was cleared by psych yesterday but will order repeat consult due to acute behavioral changes. Patient signed out to night MD pending UA and psych consult.      Final Clinical Impression(s) /  ED Diagnoses Final diagnoses:  Schizophrenia, unspecified type [facility])  Agitation    Rx / DC Orders ED Discharge Orders     None         Suzzette Righter, PA-C 06/15/22 1508    Margette Fast, MD 06/18/22 1606

## 2022-06-15 NOTE — ED Notes (Signed)
EDP in to see, and update regarding plan and d/c. IVC to be rescinded. Pt to go by safe transport to hotel. Pt refused VS, meds, and breakfast. EDP and SW aware.

## 2022-06-15 NOTE — ED Notes (Signed)
despite being informed by multiple people multiple times, pt states she is going "to a different room at [facility]", that the previous person lied and was fired, she is with safe transport and [facility], EDP and SW notified.

## 2022-06-15 NOTE — ED Notes (Signed)
Patient slept throughout the night

## 2022-06-16 DIAGNOSIS — F203 Undifferentiated schizophrenia: Secondary | ICD-10-CM

## 2022-06-16 DIAGNOSIS — F209 Schizophrenia, unspecified: Secondary | ICD-10-CM | POA: Diagnosis not present

## 2022-06-16 LAB — RESP PANEL BY RT-PCR (RSV, FLU A&B, COVID)  RVPGX2
Influenza A by PCR: NEGATIVE
Influenza B by PCR: NEGATIVE
Resp Syncytial Virus by PCR: NEGATIVE
SARS Coronavirus 2 by RT PCR: NEGATIVE

## 2022-06-16 MED ORDER — OLANZAPINE 5 MG PO TBDP
5.0000 mg | ORAL_TABLET | Freq: Every day | ORAL | Status: DC
  Administered 2022-06-17: 5 mg via ORAL
  Filled 2022-06-16: qty 1

## 2022-06-16 NOTE — Progress Notes (Signed)
[facility] LCSW Progress Note   06/16/2022    10:47 PM  Kaitlyn Duncan   [id number]   Type of Contact and Topic:  Psychiatric Bed Placement   Pt accepted to [facility]     Patient meets inpatient criteria per Abigail Miyamoto  The attending provider will be Dr. Reece Levy  Call report to [phone]  Tora Kindred, RN @ [facility] ED notified.     Pt scheduled  to arrive at [facility] 0900. Please do not call report until after patient's departure.   Mariea Clonts, MSW, LCSW-A  10:49 PM 06/16/2022

## 2022-06-16 NOTE — Progress Notes (Signed)
CSW spoke with Susie in intake at [facility]. Pt is currently being reviewed for inpatient placement. CSW will continue to assist and follow with placement.  Denna Haggard, [place]  06/16/2022 1:08 PM

## 2022-06-16 NOTE — Progress Notes (Signed)
Patient has been denied by [facility] due to no appropriate beds available. Patient meets [place] inpatient criteria per Charmaine Downs, NP. Patient has been faxed out to the following facilities:    [facility]  [place], [place] [address] [phone]  [facility]  [address] [place] [address] [phone]  [facility]  [place], [place] [address] [phone]  [facility]  [place] [place] [address] [phone] [facility]  [address], [place] [address] [phone]  [facility]  [address], [place] [address] [phone] [place]  [address], [place] [address] [phone] [facility]  [address] [place] [address] [place]  [address] [place] [address] [phone] [facility]  [address], [place] [address] [phone] [facility]  [place] [address] [phone]  [place] [address], [place] [address] [phone]  [facility]  [address] [place] [address] [phone]  [facility]  [address] [place] [address] [phone] [place], MSW, LCSW-A  10:01 PM 06/16/2022

## 2022-06-16 NOTE — Progress Notes (Signed)
[facility] ED Progress Note  06/16/2022 10:47 AM Kaitlyn Duncan  MRN:  [id number]   Subjective:  Kaitlyn Duncan is a 51 y.o. female patient admitted with hx of Schizophrenia unspecified and Melanoma Cancer who was discharged back to a motel to stay but went to the wrong hotel and was brought back to the ER.Kaitlyn Duncan  Patient went to [facility] instead of the new hotel arranged by her Legal Guardian.  [facility] does not want her back there due to destroying things and agitation.  Patient remains psychotic-paranoid, religious fixated.   [name] is her legal Guardian.  Patient want to move back to the hotel she is not allowed to come back to.  She is only taking Linzess for constipation.  The only antipsychotic in her system is the Prolixin injection given to her on the 7th of December.   Plan of care and disposition discussed with DR Leverne Humbles, Psychiatrist and we agreed that patient need inpatient Psychiatric hospitalization where she can start Forced Medication protocol by receiving injections for refusal of oral dose.  We will seek inpatient Hospitalization at any Psychiatric unit.  Patient is not responding to IS.  Principal Problem: Undifferentiated schizophrenia ([place]) Diagnosis:  Principal Problem:   Undifferentiated schizophrenia [facility])   ED Assessment Time Calculation: Start Time: 1025 Stop Time: 1040 Total Time in Minutes (Assessment Completion): 15   Past Psychiatric History: See initial psychiatry evaluation note.  Kaitlyn Duncan:  [facility] ED from 06/15/2022 in [facility] DEPT ED from [facility]/07/2022 in [facility] ED from 05/25/2022 in [facility] DEPT  C-SSRS RISK CATEGORY No Risk No Risk No Risk       Past Medical History:  Past Medical History:  Diagnosis Date   Allergy    Anemia    Gallstones    GERD (gastroesophageal reflux disease)    Hemorrhoids    melanoma with met dz dx'd  01/2018   brain and adrenal   Seasonal allergies    Stroke [facility])     Past Surgical History:  Procedure Laterality Date   BIOPSY  01/28/2018   Procedure: BIOPSY;  Surgeon: Laurence Spates, MD;  Location: [facility] ENDOSCOPY;  Service: Endoscopy;;   BREAST BIOPSY Left    ESOPHAGOGASTRODUODENOSCOPY N/A 01/28/2018   Procedure: ESOPHAGOGASTRODUODENOSCOPY (EGD);  Surgeon: Laurence Spates, MD;  Location: [facility];  Service: Endoscopy;  Laterality: N/A;   MOUTH SURGERY     OVARY SURGERY     removed "something"   Family History:  Family History  Problem Relation Age of Onset   Melanoma Mother    Hypertension Father    Breast cancer Maternal Aunt    Breast cancer Paternal Aunt    Diabetes Paternal Aunt    Breast cancer Maternal Grandmother    Breast cancer Paternal Grandmother    Diabetes Paternal Grandmother    Colon cancer Neg Hx    Esophageal cancer Neg Hx    Pancreatic cancer Neg Hx    Stomach cancer Neg Hx    Liver disease Neg Hx    Rectal cancer Neg Hx    Family Psychiatric  History: See initial psychiatry evaluation note. Social History:  Social History   Substance and Sexual Activity  Alcohol Use No     Social History   Substance and Sexual Activity  Drug Use No    Social History   Socioeconomic History   Marital status: Single    Spouse name: Not on file  Number of children: Not on file   Years of education: Not on file   Highest education level: Not on file  Occupational History   Not on file  Tobacco Use   Smoking status: Never   Smokeless tobacco: Never  Vaping Use   Vaping Use: Never used  Substance and Sexual Activity   Alcohol use: No   Drug use: No   Sexual activity: Not Currently  Other Topics Concern   Not on file  Social History Narrative   Not on file   Social Determinants of Health   Financial Resource Strain: Not on file  Food Insecurity: Food Insecurity Present (04/09/2022)   Hunger Vital Sign    Worried About Running Out of Food in the  Last Year: Sometimes true    Ran Out of Food in the Last Year: Sometimes true  Transportation Needs: Unmet Transportation Needs (04/09/2022)   PRAPARE - Hydrologist (Medical): Yes    Lack of Transportation (Non-Medical): Yes  Physical Activity: Not on file  Stress: Not on file  Social Connections: Not on file    Sleep: Fair  Appetite:  Fair  Current Medications: Current Facility-Administered Medications  Medication Dose Route Frequency Provider Last Rate Last Admin   [START ON 06/24/2022] fluPHENAZine decanoate (PROLIXIN) injection 25 mg  25 mg Intramuscular Q14 Days Small, Brooke L, PA       linaclotide (LINZESS) capsule 145 mcg  145 mcg Oral q AM Small, Brooke L, PA   145 mcg at 06/16/22 0641   pantoprazole (PROTONIX) EC tablet 40 mg  40 mg Oral Daily Small, Brooke L, PA   40 mg at 06/15/22 1807   rosuvastatin (CRESTOR) tablet 40 mg  40 mg Oral Daily Small, Brooke L, PA   40 mg at 06/15/22 5427   Current Outpatient Medications  Medication Sig Dispense Refill   fluPHENAZine decanoate (PROLIXIN) 25 MG/ML injection Inject 1 mL (25 mg total) into the muscle every 14 (fourteen) days. 5 mL 0   linaclotide (LINZESS) 145 MCG CAPS capsule Take 1 capsule (145 mcg total) by mouth in the morning. 30 capsule 11   meloxicam (MOBIC) 15 MG tablet Take 1 tablet (15 mg total) by mouth daily. (Patient not taking: Reported on 06/15/2022) 7 tablet 0   omeprazole (PRILOSEC) 20 MG capsule Take 1 capsule (20 mg total) by mouth in the morning. (Patient not taking: Reported on 06/15/2022) 30 capsule 11   rosuvastatin (CRESTOR) 40 MG tablet Take 1 tablet (40 mg total) by mouth daily. (Patient not taking: Reported on 06/15/2022) 30 tablet 3    Lab Results:  Results for orders placed or performed during the hospital encounter of 06/15/22 (from the past 48 hour(s))  Urinalysis, Routine w reflex microscopic Urine, Clean Catch     Status: Abnormal   Collection Time: 06/15/22  1:32 PM   Result Value Ref Range   Color, Urine YELLOW YELLOW   APPearance CLEAR CLEAR   Specific Gravity, Urine 1.019 1.005 - 1.030   pH 5.0 5.0 - 8.0   Glucose, UA NEGATIVE NEGATIVE mg/dL   Hgb urine dipstick NEGATIVE NEGATIVE   Bilirubin Urine NEGATIVE NEGATIVE   Ketones, ur 20 (A) NEGATIVE mg/dL   Protein, ur 30 (A) NEGATIVE mg/dL   Nitrite NEGATIVE NEGATIVE   Leukocytes,Ua SMALL (A) NEGATIVE   RBC / HPF 0-5 0 - 5 RBC/hpf   WBC, UA 6-10 0 - 5 WBC/hpf   Bacteria, UA NONE SEEN NONE SEEN  Squamous Epithelial / LPF 0-5 0 - 5   Mucus PRESENT    Hyaline Casts, UA PRESENT     Comment: Performed at [facility], [place] [address] [place], [place] [address]    Blood Alcohol level:  Lab Results  Component Value Date   ETH <10 06/10/2022   ETH <10 06/07/2022    Physical Findings:  CIWA:    COWS:     Musculoskeletal: Strength & Muscle Tone: within normal limits Gait & Station: normal Patient leans: Front  Psychiatric Specialty Exam:  Presentation  General Appearance:  Casual  Eye Contact: Good  Speech: Clear and Coherent; Normal Rate  Speech Volume: Normal  Handedness: Right   Mood and Affect  Mood: Anxious; Irritable; Labile  Affect: Congruent; Labile   Thought Process  Thought Processes: Disorganized; Irrevelant (fixed on religion)  Descriptions of Associations:Tangential  Orientation:Partial  Thought Content:Illogical; Perseveration  History of Schizophrenia/Schizoaffective disorder:Yes  Duration of Psychotic Symptoms:Greater than six months  Hallucinations:Hallucinations: None  Ideas of Reference:None  Suicidal Thoughts:Suicidal Thoughts: No  Homicidal Thoughts:Homicidal Thoughts: No   Sensorium  Memory: Immediate Fair; Recent Fair; Remote Fair  Judgment: Impaired  Insight: Lacking   Executive Functions  Concentration: Fair  Attention Span: Good  Recall: [employer] of  Knowledge: Fair  Language: Fair   Psychomotor Activity  Psychomotor Activity: Psychomotor Activity: Normal   Assets  Assets: Communication Skills; Social Support   Sleep  Sleep: Sleep: Fair    Physical Exam: Physical Exam Vitals and nursing note reviewed.  Constitutional:      Appearance: Normal appearance.  HENT:     Head: Normocephalic and atraumatic.     Nose: Nose normal.  Cardiovascular:     Rate and Rhythm: Normal rate and regular rhythm.  Pulmonary:     Effort: Pulmonary effort is normal.  Musculoskeletal:        General: Normal range of motion.     Cervical back: Normal range of motion.  Skin:    General: Skin is warm and dry.  Neurological:     General: No focal deficit present.     Mental Status: She is alert and oriented to person, place, and time.    ROS-Unable to assess, patient is uncooperative. Blood pressure 119/88, pulse 81, temperature 97.6 F (36.4 C), temperature source Oral, resp. rate 18, SpO2 99 %. There is no height or weight on file to calculate BMI.   Medical Decision Making: Patient is disorganized, confused and not compliant with her Psychotropic medication. Patient remains argumentative, believes her legal guardian is [name] and that she does not need medications. She received Prolixin Injection December 7th and next dose is due December 21 st. Patient is refusing Hotel arranged by her legal guardian and where she plans to go the [place] does not want her back. Patient meets criteria for inpatient mental healthcare for medication management and treatment of severe Psychosis.  She lacks insight and her judgement for treatment is poor.  We will fax out records to hospitals for bed availability. Problem 1: Schizophrenia, Unspecified  Disposition-Admit, seek bed placement.  Delfin Gant, NP--PMHNP-BC 06/16/2022, 10:47 AM

## 2022-06-16 NOTE — Progress Notes (Signed)
LCSW Progress Note  [id number]   Kaitlyn Duncan  06/16/2022  11:00 AM  Description:   Inpatient Psychiatric Referral  Patient was recommended inpatient per Delfin Gant, NP--PMHNP-BC. There are no available beds at [facility]. Patient was referred to the following facilities:   Destination Service Provider Address Phone Fax  [facility]  [place], [place] [address] [place]  [facility]  [address] [place] [address] [phone] [facility]  [place], [place] [address] [phone]  [facility]  [place] [place] [address] [phone] [facility]  [address], [place] [address] [phone]  [facility]  [address] [place] [address] [phone] [place]  [address] [place] [address] [phone] [facility]  [address] [place] [address] [place]  [address], [place] [address] [phone] [facility]  [address], [place] [address] [phone] [facility]  [place] [address] [phone]  [place] [address], [place] [address] [phone]  [facility]  [address] [place] [address] [phone]  [facility]  [address], [place] [address] [phone]     Situation ongoing, CSW to continue following and update chart as more information becomes available.      Denna Haggard, LCSWA  06/16/2022  11:00 AM

## 2022-06-16 NOTE — Progress Notes (Signed)
Pt was denied at [facility] due to pt's current aggressive behavior. CSW will assist and follow with placement.  Denna Haggard, [place]  06/16/2022 11:20 AM

## 2022-06-16 NOTE — Progress Notes (Signed)
Pt is recommended inpatient treatment per Delfin Gant, NP--PMHNP-BC. CSW received a phone call from [facility] admissions reporting pt is under review. CSW will assist and follow with placement.  Denna Haggard, [place]  06/16/2022 11:15 AM

## 2022-06-17 DIAGNOSIS — F209 Schizophrenia, unspecified: Secondary | ICD-10-CM | POA: Diagnosis not present

## 2022-06-17 NOTE — ED Notes (Signed)
Pt. Refused vitals.

## 2022-06-17 NOTE — Progress Notes (Addendum)
[facility] Psych ED Progress Note  06/17/2022 12:11 PM Darbi Chandran  MRN:  [id number]   Subjective:  Kaitlyn Duncan is a 51 y.o. female patient with a history of Schizophrenia and Metastatic Melanoma who was discharged from [facility] on 06/15/22 back to a motel to stay but went to the wrong hotel and was brought back to the ER. Patient went to [facility] instead of the new hotel arranged by her Legal Guardian. [place] does not want her back there due to destroying things and agitation.   On evaluation today, the patient states that her name is not [place]. She continues to present with hyper religious and delusional thought content. She denies audtiory and visual hallucinations. No indication that she is currently responding to internal stimuli. On chart review, it is noted that she continues to have periods of agitation/agressiveness and episodes where she is possibly RIS.   Patient has been accepted for [facility] to [facility] treatment. She has been petitioned for IVC by this provider. IVC documents given to ED secretary.  Principal Problem: Undifferentiated schizophrenia ([place]) Diagnosis:  Principal Problem:   Undifferentiated schizophrenia [facility])   ED Assessment Time Calculation: Start Time: 0930 Stop Time: 0945 Total Time in Minutes (Assessment Completion): 15   Past Psychiatric History: See initial psychiatry evaluation note.  [place] Scale:  [facility] ED from 06/15/2022 in [facility] DEPT ED from 06/07/2022 in [facility] ED from 05/25/2022 in [facility] DEPT  C-SSRS RISK CATEGORY No Risk No Risk No Risk       Past Medical History:  Past Medical History:  Diagnosis Date   Allergy    Anemia    Gallstones    GERD (gastroesophageal reflux disease)    Hemorrhoids    melanoma with met dz dx'd 01/2018   brain and adrenal   Seasonal allergies     Stroke [facility])     Past Surgical History:  Procedure Laterality Date   BIOPSY  01/28/2018   Procedure: BIOPSY;  Surgeon: Laurence Spates, MD;  Location: [facility] ENDOSCOPY;  Service: Endoscopy;;   BREAST BIOPSY Left    ESOPHAGOGASTRODUODENOSCOPY N/A 01/28/2018   Procedure: ESOPHAGOGASTRODUODENOSCOPY (EGD);  Surgeon: Laurence Spates, MD;  Location: [facility];  Service: Endoscopy;  Laterality: N/A;   MOUTH SURGERY     OVARY SURGERY     removed "something"   Family History:  Family History  Problem Relation Age of Onset   Melanoma Mother    Hypertension Father    Breast cancer Maternal Aunt    Breast cancer Paternal Aunt    Diabetes Paternal Aunt    Breast cancer Maternal Grandmother    Breast cancer Paternal Grandmother    Diabetes Paternal Grandmother    Colon cancer Neg Hx    Esophageal cancer Neg Hx    Pancreatic cancer Neg Hx    Stomach cancer Neg Hx    Liver disease Neg Hx    Rectal cancer Neg Hx    Family Psychiatric  History: See initial psychiatry evaluation note. Social History:  Social History   Substance and Sexual Activity  Alcohol Use No     Social History   Substance and Sexual Activity  Drug Use No    Social History   Socioeconomic History   Marital status: Single    Spouse name: Not on file   Number of children: Not on file   Years of education: Not on file   Highest  education level: Not on file  Occupational History   Not on file  Tobacco Use   Smoking status: Never   Smokeless tobacco: Never  Vaping Use   Vaping Use: Never used  Substance and Sexual Activity   Alcohol use: No   Drug use: No   Sexual activity: Not Currently  Other Topics Concern   Not on file  Social History Narrative   Not on file   Social Determinants of Health   Financial Resource Strain: Not on file  Food Insecurity: Food Insecurity Present (04/09/2022)   Hunger Vital Sign    Worried About Running Out of Food in the Last Year: Sometimes true    Ran Out of Food in the  Last Year: Sometimes true  Transportation Needs: Unmet Transportation Needs (04/09/2022)   PRAPARE - Hydrologist (Medical): Yes    Lack of Transportation (Non-Medical): Yes  Physical Activity: Not on file  Stress: Not on file  Social Connections: Not on file    Sleep: Fair  Appetite:  Fair  Current Medications: Current Facility-Administered Medications  Medication Dose Route Frequency Provider Last Rate Last Admin   [START ON 06/24/2022] fluPHENAZine decanoate (PROLIXIN) injection 25 mg  25 mg Intramuscular Q14 Days Small, Brooke L, PA       linaclotide (LINZESS) capsule 145 mcg  145 mcg Oral q AM Small, Brooke L, PA   145 mcg at 06/17/22 1026   OLANZapine zydis (ZYPREXA) disintegrating tablet 5 mg  5 mg Oral QHS Onuoha, Josephine C, NP       pantoprazole (PROTONIX) EC tablet 40 mg  40 mg Oral Daily Small, Brooke L, PA   40 mg at 06/15/22 1807   rosuvastatin (CRESTOR) tablet 40 mg  40 mg Oral Daily Small, Brooke L, PA   40 mg at 06/15/22 8527   Current Outpatient Medications  Medication Sig Dispense Refill   fluPHENAZine decanoate (PROLIXIN) 25 MG/ML injection Inject 1 mL (25 mg total) into the muscle every 14 (fourteen) days. 5 mL 0   linaclotide (LINZESS) 145 MCG CAPS capsule Take 1 capsule (145 mcg total) by mouth in the morning. 30 capsule 11   meloxicam (MOBIC) 15 MG tablet Take 1 tablet (15 mg total) by mouth daily. (Patient not taking: Reported on 06/15/2022) 7 tablet 0   omeprazole (PRILOSEC) 20 MG capsule Take 1 capsule (20 mg total) by mouth in the morning. (Patient not taking: Reported on 06/15/2022) 30 capsule 11   rosuvastatin (CRESTOR) 40 MG tablet Take 1 tablet (40 mg total) by mouth daily. (Patient not taking: Reported on 06/15/2022) 30 tablet 3    Lab Results:  Results for orders placed or performed during the hospital encounter of 06/15/22 (from the past 48 hour(s))  Urinalysis, Routine w reflex microscopic Urine, Clean Catch     Status:  Abnormal   Collection Time: 06/15/22  1:32 PM  Result Value Ref Range   Color, Urine YELLOW YELLOW   APPearance CLEAR CLEAR   Specific Gravity, Urine 1.019 1.005 - 1.030   pH 5.0 5.0 - 8.0   Glucose, UA NEGATIVE NEGATIVE mg/dL   Hgb urine dipstick NEGATIVE NEGATIVE   Bilirubin Urine NEGATIVE NEGATIVE   Ketones, ur 20 (A) NEGATIVE mg/dL   Protein, ur 30 (A) NEGATIVE mg/dL   Nitrite NEGATIVE NEGATIVE   Leukocytes,Ua SMALL (A) NEGATIVE   RBC / HPF 0-5 0 - 5 RBC/hpf   WBC, UA 6-10 0 - 5 WBC/hpf   Bacteria, UA NONE  SEEN NONE SEEN   Squamous Epithelial / LPF 0-5 0 - 5   Mucus PRESENT    Hyaline Casts, UA PRESENT     Comment: Performed at [facility], [place] [address], [place], [place] [address]  Resp panel by RT-PCR (RSV, Flu A&B, Covid) Anterior Nasal Swab     Status: None   Collection Time: 06/16/22  3:42 PM   Specimen: Anterior Nasal Swab  Result Value Ref Range   SARS Coronavirus 2 by RT PCR NEGATIVE NEGATIVE    Comment: (NOTE) SARS-CoV-2 target nucleic acids are NOT DETECTED.  The SARS-CoV-2 RNA is generally detectable in upper respiratory specimens during the acute phase of infection. The lowest concentration of SARS-CoV-2 viral copies this assay can detect is 138 copies/mL. A negative result does not preclude SARS-Cov-2 infection and should not be used as the sole basis for treatment or other patient management decisions. A negative result may occur with  improper specimen collection/handling, submission of specimen other than nasopharyngeal swab, presence of viral mutation(s) within the areas targeted by this assay, and inadequate number of viral copies(<138 copies/mL). A negative result must be combined with clinical observations, patient history, and epidemiological information. The expected result is Negative.  Fact Sheet for Patients:  EntrepreneurPulse.com.au  Fact Sheet for Healthcare Providers:   IncredibleEmployment.be  This test is no t yet approved or cleared by the [place] FDA and  has been authorized for detection and/or diagnosis of SARS-CoV-2 by FDA under an Emergency Use Authorization (EUA). This EUA will remain  in effect (meaning this test can be used) for the duration of the COVID-19 declaration under Section 564(b)(1) of the Act, 21 U.S.C.section 360bbb-3(b)(1), unless the authorization is terminated  or revoked sooner.       Influenza A by PCR NEGATIVE NEGATIVE   Influenza B by PCR NEGATIVE NEGATIVE    Comment: (NOTE) The Xpert Xpress SARS-CoV-2/FLU/RSV plus assay is intended as an aid in the diagnosis of influenza from Nasopharyngeal swab specimens and should not be used as a sole basis for treatment. Nasal washings and aspirates are unacceptable for Xpert Xpress SARS-CoV-2/FLU/RSV testing.  Fact Sheet for Patients: EntrepreneurPulse.com.au  Fact Sheet for Healthcare Providers: IncredibleEmployment.be  This test is not yet approved or cleared by the [place] FDA and has been authorized for detection and/or diagnosis of SARS-CoV-2 by FDA under an Emergency Use Authorization (EUA). This EUA will remain in effect (meaning this test can be used) for the duration of the COVID-19 declaration under Section 564(b)(1) of the Act, 21 U.S.C. section 360bbb-3(b)(1), unless the authorization is terminated or revoked.     Resp Syncytial Virus by PCR NEGATIVE NEGATIVE    Comment: (NOTE) Fact Sheet for Patients: EntrepreneurPulse.com.au  Fact Sheet for Healthcare Providers: IncredibleEmployment.be  This test is not yet approved or cleared by the [place] FDA and has been authorized for detection and/or diagnosis of SARS-CoV-2 by FDA under an Emergency Use Authorization (EUA). This EUA will remain in effect (meaning this test can be used) for the duration of  the COVID-19 declaration under Section 564(b)(1) of the Act, 21 U.S.C. section 360bbb-3(b)(1), unless the authorization is terminated or revoked.  Performed at [facility], [place] [address] [place], [place] [address]     Blood Alcohol level:  Lab Results  Component Value Date   [facility] <10 06/10/2022   ETH <10 06/07/2022    Physical Findings:  CIWA:    COWS:     Musculoskeletal: Strength & Muscle  Tone: within normal limits Gait & Station: normal Patient leans: Front  Psychiatric Specialty Exam:  Presentation  General Appearance:  Casual  Eye Contact: Fair  Speech: Clear and Coherent; Normal Rate  Speech Volume: Normal  Handedness: Right   Mood and Affect  Mood: Anxious; Irritable; Labile  Affect: Labile   Thought Process  Thought Processes: Irrevelant; Disorganized  Descriptions of Associations:Loose  Orientation:Partial  Thought Content:Illogical; Delusions  History of Schizophrenia/Schizoaffective disorder:Yes  Duration of Psychotic Symptoms:Greater than six months  Hallucinations:Hallucinations: None   Ideas of Reference:Delusions  Suicidal Thoughts:Suicidal Thoughts: No   Homicidal Thoughts:Homicidal Thoughts: No    Sensorium  Memory: Immediate Fair; Recent Fair; Remote Fair  Judgment: Impaired  Insight: Lacking   Executive Functions  Concentration: Fair  Attention Span: Fair  Recall: [employer] of Knowledge: Fair  Language: Fair   Psychomotor Activity  Psychomotor Activity: Psychomotor Activity: Normal    Assets  Assets: Social Support   Sleep  Sleep: Sleep: Fair     Physical Exam: Physical Exam Vitals and nursing note reviewed.  Constitutional:      Appearance: Normal appearance.  HENT:     Head: Normocephalic and atraumatic.     Nose: Nose normal.  Cardiovascular:     Rate and Rhythm: Normal rate and regular rhythm.  Pulmonary:     Effort: Pulmonary effort is  normal.  Musculoskeletal:        General: Normal range of motion.     Cervical back: Normal range of motion.  Skin:    General: Skin is warm and dry.  Neurological:     General: No focal deficit present.     Mental Status: She is alert and oriented to person, place, and time.    Review of Systems  Respiratory:  Negative for cough and shortness of breath.   Cardiovascular:  Negative for chest pain.  Gastrointestinal:  Negative for diarrhea, nausea and vomiting.    Blood pressure 109/61, pulse 84, temperature 97.7 F (36.5 C), temperature source Oral, resp. rate 16, SpO2 100 %. There is no height or weight on file to calculate BMI.   Medical Decision Making: Patient is disorganized, confused and not compliant with her Psychotropic medication. Patient remains argumentative, believes her legal guardian is [name] and that she does not need medications. She received Prolixin Injection December 7th and next dose is due December 21 st. Patient is refusing Hotel arranged by her legal guardian and where she plans to go the [place] does not want her back. Patient meets criteria for inpatient mental healthcare for medication management and treatment of severe Psychosis.  She lacks insight and her judgement for treatment is poor.  We will fax out records to hospitals for bed availability. Problem 1: Schizophrenia, Unspecified  Disposition-Admit, seek bed placement.  Rozetta Nunnery, NP--PMHNP-BC 06/17/2022, 12:11 PM

## 2022-06-17 NOTE — ED Provider Notes (Signed)
Emergency Medicine Observation Re-evaluation Note  Kaitlyn Duncan is a 51 y.o. female, seen on rounds today.  Pt initially presented to the ED for complaints of Follow-up Currently, the patient is resting quietly.  Physical Exam  BP 109/61 (BP Location: Right Arm)   Pulse 84   Temp 97.7 F (36.5 C) (Oral)   Resp 16   SpO2 100%  Physical Exam General: No acute distress Cardiac: Well-perfused Lungs: Nonlabored Psych: Cooperative  ED Course / MDM  EKG:   I have reviewed the labs performed to date as well as medications administered while in observation.  Recent changes in the last 24 hours include psychiatric evaluation.  Plan  Current plan is for anticipated for placement today to [facility].    Hayden Rasmussen, MD 06/17/22 2023

## 2022-06-17 NOTE — ED Notes (Signed)
Pt refused VS  

## 2022-06-18 DIAGNOSIS — F209 Schizophrenia, unspecified: Secondary | ICD-10-CM | POA: Diagnosis not present

## 2022-06-18 NOTE — ED Notes (Signed)
Patient off unit to facility per provider. Patient alert and no s/s of distress. Discharge information and belongings given to [facility] for transport. Patient ambulatory. Patient off unit  in w/c, escorted and transported by [facility].

## 2022-06-18 NOTE — ED Notes (Signed)
Sleeping with no signs of distress respirations appear easy and no sleep disturbance is noted.

## 2022-06-18 NOTE — ED Provider Notes (Signed)
Emergency Medicine Observation Re-evaluation Note  Kaitlyn Duncan is a 51 y.o. female, seen on rounds today.  Pt initially presented to the ED for complaints of Follow-up Currently, the patient is asleep in bed.  Physical Exam  BP (!) 130/95 (BP Location: Right Leg)   Pulse 83   Temp 99.1 F (37.3 C) (Oral)   Resp 16   SpO2 98%  Physical Exam General: Asleep, no acute distress Cardiac: Regular rate Lungs: No increased work of breathing Psych: Calm, asleep  ED Course / MDM  EKG:   I have reviewed the labs performed to date as well as medications administered while in observation.  Recent changes in the last 24 hours include pending inpatient, may have bed available at [facility].  Plan  Current plan is for inpatient.    Kemper Durie, DO 06/18/22 [phone]

## 2022-06-18 NOTE — ED Notes (Signed)
Remains asleep no change in status respirations remain easy.

## 2022-06-21 NOTE — Psych (Cosign Needed Addendum)
This provider had a request to follow-up with legal guardian [name].  Left a confidential voicemail " [name] DSS."  to address any concerns that she may have had.  Advised J.Yarbrough that this provider was unable to speak with legal guardian

## 2022-06-24 NOTE — Progress Notes (Signed)
This CSW contacted the pt's legal guardian via phone after being notified by Romelle Starcher, CSW, that she had questions regarding the pt's last visit. [name] inquired about the pt's behavior and this CSW informed that when the pt returned to the hospital, TOC did not engage and was never consulted due to the pt being seen by TTS and meeting Inpatient criteria. This CSW reiterated that TOC did not have any interaction the second time the pt came into the ED. LG verbalized understanding. This CSW informed LG that Tanika, NP had attempted to reach out to her. LG requested that all calls be made to her cell phone [phone]). This CSW has since spoken with Ludger Nutting, NP, who will now attempt to reach the LG via cell phone and follow up to see if there are any further questions regarding the pt's most recent visit.

## 2022-07-07 ENCOUNTER — Emergency Department (HOSPITAL_COMMUNITY)
Admission: EM | Admit: 2022-07-07 | Discharge: 2022-07-08 | Disposition: A | Discharge status: Home / Self Care | Payer: Medicare Other | Attending: Emergency Medicine | Admitting: Emergency Medicine

## 2022-07-07 ENCOUNTER — Other Ambulatory Visit: Payer: Self-pay

## 2022-07-07 ENCOUNTER — Encounter (HOSPITAL_COMMUNITY): Payer: Self-pay

## 2022-07-07 DIAGNOSIS — R519 Headache, unspecified: Secondary | ICD-10-CM | POA: Insufficient documentation

## 2022-07-07 DIAGNOSIS — D696 Thrombocytopenia, unspecified: Secondary | ICD-10-CM | POA: Diagnosis not present
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
## 2022-07-08 ENCOUNTER — Emergency Department (HOSPITAL_COMMUNITY): Payer: Medicare Other

## 2022-07-08 DIAGNOSIS — R519 Headache, unspecified: Secondary | ICD-10-CM | POA: Diagnosis not present

## 2022-07-08 LAB — BASIC METABOLIC PANEL
Anion gap: 9 (ref 5–15)
BUN: 23 mg/dL — ABNORMAL HIGH (ref 6–20)
CO2: 26 mmol/L (ref 22–32)
Calcium: 9.2 mg/dL (ref 8.9–10.3)
Chloride: 102 mmol/L (ref 98–111)
Creatinine, Ser: 0.66 mg/dL (ref 0.44–1.00)
GFR, Estimated: >60 mL/min (ref >60)
Glucose, Bld: 91 mg/dL (ref 70–99)
Potassium: 3.4 mmol/L — ABNORMAL LOW (ref 3.5–5.1)
Sodium: 137 mmol/L (ref 135–145)

## 2022-07-08 LAB — CBC
HCT: 38 % (ref 36.0–46.0)
Hemoglobin: 12 g/dL (ref 12.0–15.0)
MCH: 26.5 pg (ref 26.0–34.0)
MCHC: 31.6 g/dL (ref 30.0–36.0)
MCV: 84.1 fL (ref 80.0–100.0)
Platelets: 58 10*3/uL — ABNORMAL LOW (ref 150–400)
RBC: 4.52 MIL/uL (ref 3.87–5.11)
RDW: 13 % (ref 11.5–15.5)
WBC: 4.1 10*3/uL (ref 4.0–10.5)
nRBC: 0 % (ref 0.0–0.2)

## 2022-07-08 LAB — COOXEMETRY PANEL
Carboxyhemoglobin: 2.2 % — ABNORMAL HIGH (ref 0.5–1.5)
Methemoglobin: 1.2 % (ref 0.0–1.5)
O2 Saturation: 86 %
Total hemoglobin: 12.8 g/dL (ref 12.0–16.0)

## 2022-07-08 MED ORDER — KETOROLAC TROMETHAMINE 15 MG/ML IJ SOLN
15.0000 mg | Freq: Once | INTRAMUSCULAR | Status: AC
  Administered 2022-07-08: 15 mg via INTRAVENOUS
  Filled 2022-07-08: qty 1

## 2022-07-08 MED ORDER — METOCLOPRAMIDE HCL 5 MG/ML IJ SOLN: 10.0000 mg | Freq: Once | INTRAMUSCULAR | Status: DC

## 2022-07-08 MED ORDER — DIPHENHYDRAMINE HCL 50 MG/ML IJ SOLN: 25.0000 mg | Freq: Once | INTRAMUSCULAR | Status: DC

## 2022-07-08 MED ORDER — KETOROLAC TROMETHAMINE 15 MG/ML IJ SOLN
15.0000 mg | Freq: Once | INTRAMUSCULAR | Status: DC
  Filled 2022-07-08: qty 1

## 2022-07-08 NOTE — ED Notes (Signed)
Safe transport called to transport patient to the [facility]. Patient's legal guardian updated on plan of care and discharge instructions.

## 2022-07-08 NOTE — Discharge Instructions (Addendum)
Please take tylenol/ibuprofen for pain. I recommend close follow-up with PCP for reevaluation and repeat blood work to check for platelet count in the next 1-2 weeks.  Please do not hesitate to return to emergency department if worrisome signs symptoms we discussed become apparent including but not limited to signs of bleeding, bloody stools, weakness of arms or legs, slurred speech, or any other symptoms concerning to you.

## 2022-07-08 NOTE — ED Notes (Signed)
Patient ambulatory to and from bathroom independently.

## 2022-07-08 NOTE — ED Notes (Signed)
Per registration, legal guardian refusing tx for patient. Yvone Neu, [place] made aware.

## 2022-07-08 NOTE — ED Provider Notes (Signed)
[facility] DEPT Provider Note   CSN: [id number] Arrival date & time: 07/07/22  1925     History  Chief Complaint  Patient presents with   Headache    Kaitlyn Duncan is a 52 y.o. female with a past medical history of GERD, melanoma with mets in the brain and adrenal gland, stroke presenting to the emergency room for evaluation of headache.  Patient reports that she started to have headache last night when she was around smokers outside.  She reports that the headaches move all of her head and constant.  She denies any vision changes or photophobia.  She had a history of migraine in the past.  She denies nausea, vomiting, dizziness.  Patient reports history of stroke in October and she is on aspirin daily.   Headache   Past Medical History:  Diagnosis Date   Allergy    Anemia    Gallstones    GERD (gastroesophageal reflux disease)    Hemorrhoids    melanoma with met dz dx'd 01/2018   brain and adrenal   Seasonal allergies    Stroke [facility])    Past Surgical History:  Procedure Laterality Date   BIOPSY  01/28/2018   Procedure: BIOPSY;  Surgeon: Laurence Spates, MD;  Location: [facility];  Service: Endoscopy;;   BREAST BIOPSY Left    ESOPHAGOGASTRODUODENOSCOPY N/A 01/28/2018   Procedure: ESOPHAGOGASTRODUODENOSCOPY (EGD);  Surgeon: Laurence Spates, MD;  Location: [facility];  Service: Endoscopy;  Laterality: N/A;   MOUTH SURGERY     OVARY SURGERY     removed "something"     Home Medications Prior to Admission medications   Medication Sig Start Date End Date Taking? Authorizing Provider  fluPHENAZine decanoate (PROLIXIN) 25 MG/ML injection Inject 1 mL (25 mg total) into the muscle every 14 (fourteen) days. 06/14/22   Malvin Johns, MD  linaclotide (LINZESS) 145 MCG CAPS capsule Take 1 capsule (145 mcg total) by mouth in the morning. 06/14/22 06/09/23  Malvin Johns, MD  meloxicam (MOBIC) 15 MG tablet Take 1 tablet (15 mg total) by mouth  daily. Patient not taking: Reported on 06/15/2022 05/25/22   Orpah Greek, MD  omeprazole (PRILOSEC) 20 MG capsule Take 1 capsule (20 mg total) by mouth in the morning. Patient not taking: Reported on 06/15/2022 06/14/22   Malvin Johns, MD  rosuvastatin (CRESTOR) 40 MG tablet Take 1 tablet (40 mg total) by mouth daily. Patient not taking: Reported on 06/15/2022 06/14/22   Malvin Johns, MD      Allergies    Patient has no known allergies.    Review of Systems   Review of Systems  Neurological:  Positive for headaches.    Physical Exam Updated Vital Signs BP (!) 140/81 (BP Location: Right Arm)   Pulse 95   Temp 98.4 F (36.9 C) (Oral)   Resp 18   Ht 5' (1.524 m)   Wt 63 kg   SpO2 98%   BMI 27.13 kg/m  Physical Exam Vitals and nursing note reviewed.  Constitutional:      Appearance: Normal appearance.  HENT:     Head: Normocephalic and atraumatic.     Mouth/Throat:     Mouth: Mucous membranes are moist.  Eyes:     General: No visual field deficit or scleral icterus. Cardiovascular:     Rate and Rhythm: Normal rate and regular rhythm.     Pulses: Normal pulses.     Heart sounds: Normal heart sounds.  Pulmonary:  Effort: Pulmonary effort is normal.     Breath sounds: Normal breath sounds.  Abdominal:     General: Abdomen is flat.     Palpations: Abdomen is soft.     Tenderness: There is no abdominal tenderness.  Musculoskeletal:        General: No deformity.  Skin:    General: Skin is warm.     Findings: No rash.  Neurological:     General: No focal deficit present.     Mental Status: She is alert.     GCS: GCS eye subscore is 4. GCS verbal subscore is 5. GCS motor subscore is 6.     Cranial Nerves: No cranial nerve deficit, dysarthria or facial asymmetry.     Sensory: Sensory deficit: Patient report more sensation on L side of face than R, has not changed since cva in October.     Motor: No weakness.     Coordination: Coordination normal.   Psychiatric:        Mood and Affect: Mood normal.     ED Results / Procedures / Treatments   Labs (all labs ordered are listed, but only abnormal results are displayed) Labs Reviewed  CBC - Abnormal; Notable for the following components:      Result Value   Platelets 58 (*)    All other components within normal limits  BASIC METABOLIC PANEL - Abnormal; Notable for the following components:   Potassium 3.4 (*)    BUN 23 (*)    All other components within normal limits  COOXEMETRY PANEL - Abnormal; Notable for the following components:   Carboxyhemoglobin 2.2 (*)    All other components within normal limits    EKG None  Radiology CT Head Wo Contrast  Result Date: 07/08/2022 CLINICAL DATA:  Headache. History of stroke and metastatic melanoma to the brain. EXAM: CT HEAD WITHOUT CONTRAST TECHNIQUE: Contiguous axial images were obtained from the base of the skull through the vertex without intravenous contrast. RADIATION DOSE REDUCTION: This exam was performed according to the departmental dose-optimization program which includes automated exposure control, adjustment of the mA and/or kV according to patient size and/or use of iterative reconstruction technique. COMPARISON:  CT head 06/10/2022 FINDINGS: Brain: There is no acute intracranial hemorrhage, extra-axial fluid collection, or acute infarct. Background parenchymal volume is normal. The ventricles are normal in size. Gray-white differentiation is preserved. The remote left thalamic capsular infarct and small remote infarct in the right cerebellar hemisphere are unchanged. Additional hypodensity in the supratentorial white matter likely reflects sequela of prior whole-brain radiation, unchanged. There is no mass effect or midline shift. Vascular: No hyperdense vessel or unexpected calcification. Skull: Normal. Negative for fracture or focal lesion. Sinuses/Orbits: There is unchanged chronic left maxillary sinusitis. The globes and orbits  are unremarkable. Other: None. IMPRESSION: Stable noncontrast head CT with no acute intracranial pathology. Electronically Signed   By: Valetta Mole M.D.   On: 07/08/2022 09:21    Procedures Procedures    Medications Ordered in ED Medications  ketorolac (TORADOL) 15 MG/ML injection 15 mg (15 mg Intravenous Given 07/08/22 1151)    ED Course/ Medical Decision Making/ A&P                           Medical Decision Making Amount and/or Complexity of Data Reviewed Labs: ordered. Radiology: ordered.  Risk Prescription drug management.   This patient presents to the ED for headache, this involves an extensive number of  treatment options, and is a complaint that carries with a high risk of complications and morbidity.  The differential diagnosis includes cluster headache, migraine, sinusitis, tension headache, acute glaucoma, CO poisoning, encephalitis, encephalopathy, meningitis, mass, pseudotumor, subarachnoid hemorrhage, temporal arteritis/giant cell arteritis, traumatic intracranial hemorrhage. This is not an exhaustive list.  Lab tests: I ordered and personally interpreted labs.  The pertinent results include: WBC unremarkable. Hbg unremarkable. Platelets unremarkable. Potassium 3.4 BUN 23, creatinine unremarkable. Co 2.2.  Imaging studies: I ordered imaging studies. I personally reviewed, interpreted imaging and agree with the radiologist's interpretations. The results include: CT head negative.   Problem list/ ED course/ Critical interventions/ Medical management: HPI: See above Vital signs within normal range and stable throughout visit. Laboratory/imaging studies significant for: See above. On physical examination, patient is afebrile and appears in no acute distress. This patient presents with a headache most consistent with benign headache from either tension type headache vs migraine. No headache red flags. Patient report more sensation on L side of face than R, has not  changed since cva in October. Neurologic exam without evidence of meningismus, AMS, focal neurologic findings so doubt meningitis, encephalitis, stroke. Presentation and imaging not consistent with acute intracranial bleed to include SAH (lack of risk factors, headache history). No history of trauma so doubt ICH. Given history and physical temporal arteritis unlikely, as is acute angle closure glaucoma. Doubt carotid artery dissection given no acute focal neuro deficits, no neck trauma or recent neck strain. Patient with no signs of increased intracranial pressure or weight loss and history and physical suggest more benign headache so less likely mass effect in brain from tumor or abscess or idiopathic intracranial hypertension. Pain was controlled with Toradol and patient discharged home with PCP follow up. I have reviewed the patient home medicines and have made adjustments as needed.  Cardiac monitoring/EKG: The patient was maintained on a cardiac monitor.  I personally reviewed and interpreted the cardiac monitor which showed an underlying rhythm of: sinus rhythm.  Additional history obtained: External records from outside source obtained and reviewed including: Chart review including previous notes, labs, imaging.  Consultations obtained: I requested consultation with Dr. Nechama Guard, and discussed lab and imaging findings as well as pertinent plan.  She agrees with the plan.  Disposition Continued outpatient therapy. Follow-up with PCP recommended for reevaluation of symptoms. Treatment plan discussed with patient.  Pt acknowledged understanding was agreeable to the plan. Worrisome signs and symptoms were discussed with patient, and patient acknowledged understanding to return to the ED if they noticed these signs and symptoms. Patient was stable upon discharge.   This chart was dictated using voice recognition software.  Despite best efforts to proofread,  errors can occur which can change the  documentation meaning.          Final Clinical Impression(s) / ED Diagnoses Final diagnoses:  Nonintractable headache, unspecified chronicity pattern, unspecified headache type  Thrombocytopenia [facility])    Rx / DC Orders ED Discharge Orders     None         Rex Kras, [place] 07/08/22 1812    Elgie Congo, MD 07/08/22 [phone]

## 2022-07-20 ENCOUNTER — Telehealth: Payer: Self-pay | Admitting: Oncology

## 2022-07-20 NOTE — Telephone Encounter (Signed)
Called patient regarding providers departure, spoke with patient's legal guardian. Patient will be notified ands has been rescheduled with new provider.

## 2022-07-21 ENCOUNTER — Ambulatory Visit (INDEPENDENT_AMBULATORY_CARE_PROVIDER_SITE_OTHER): Payer: Medicare Other | Admitting: Internal Medicine

## 2022-07-21 ENCOUNTER — Encounter: Payer: Self-pay | Admitting: Internal Medicine

## 2022-07-21 ENCOUNTER — Encounter: Payer: Medicare Other | Admitting: Internal Medicine

## 2022-07-21 VITALS — BP 118/80 | HR 100 | Temp 97.9°F | Ht 60.0 in | Wt 128.0 lb

## 2022-07-21 DIAGNOSIS — Z8673 Personal history of transient ischemic attack (TIA), and cerebral infarction without residual deficits: Secondary | ICD-10-CM | POA: Diagnosis not present

## 2022-07-21 DIAGNOSIS — D696 Thrombocytopenia, unspecified: Secondary | ICD-10-CM | POA: Diagnosis not present

## 2022-07-21 DIAGNOSIS — C439 Malignant melanoma of skin, unspecified: Secondary | ICD-10-CM

## 2022-07-21 DIAGNOSIS — K5900 Constipation, unspecified: Secondary | ICD-10-CM

## 2022-07-21 MED ORDER — LINACLOTIDE 290 MCG PO CAPS: 290.0000 ug | ORAL_CAPSULE | Freq: Every day | ORAL | 1 refills | Status: DC

## 2022-07-21 NOTE — Progress Notes (Signed)
   Subjective:   Patient ID: Kaitlyn Duncan, female    DOB: 31-Oct-1970, 52 y.o.   MRN: [id number]  HPI The patient is a 52 YO female transfer of care. Multiple extended ER visits recently. Has guardian no one present with her today. Still having constipation and rectal bleeding with wiping not between BMs. Not staining undergarments.  PMH, [facility], social history reviewed and updated  Review of Systems  Unable to perform ROS: Psychiatric disorder  Constitutional:  Positive for appetite change.  Respiratory: Negative.    Cardiovascular: Negative.   Gastrointestinal:  Positive for anal bleeding, blood in stool and constipation. Negative for abdominal pain, diarrhea, nausea and vomiting.  Musculoskeletal: Negative.     Objective:  Physical Exam Constitutional:      Appearance: She is well-developed.  HENT:     Head: Normocephalic and atraumatic.  Cardiovascular:     Rate and Rhythm: Normal rate and regular rhythm.  Pulmonary:     Effort: Pulmonary effort is normal. No respiratory distress.     Breath sounds: Normal breath sounds. No wheezing or rales.  Abdominal:     General: Bowel sounds are normal. There is no distension.     Palpations: Abdomen is soft.     Tenderness: There is no abdominal tenderness. There is no rebound.  Musculoskeletal:     Cervical back: Normal range of motion.  Skin:    General: Skin is warm and dry.  Neurological:     Mental Status: She is alert and oriented to person, place, and time.     Coordination: Coordination normal.  Psychiatric:     Comments: Able to maintain conversation and aware of recent events, able to describe housing situation     Vitals:   07/21/22 0945  BP: 118/80  Pulse: 100  Temp: 97.9 F (36.6 C)  TempSrc: Oral  SpO2: 98%  Weight: 128 lb (58.1 kg)  Height: 5' (1.524 m)    Assessment & Plan:  Visit time 25 minutes in face to face communication with patient and coordination of care, additional 15 minutes spent in  record review, coordination or care, ordering tests, communicating/referring to other healthcare professionals, documenting in medical records all on the same day of the visit for total time 40 minutes spent on the visit.

## 2022-07-21 NOTE — Patient Instructions (Addendum)
We have sent in linzess stronger dose to help the constipation more.

## 2022-07-22 ENCOUNTER — Encounter: Payer: Self-pay | Admitting: Internal Medicine

## 2022-07-23 DIAGNOSIS — K5900 Constipation, unspecified: Secondary | ICD-10-CM | POA: Insufficient documentation

## 2022-07-23 NOTE — Assessment & Plan Note (Addendum)
She should continue on statin lifelong. Continue crestor 40 mg daily. She is not on antiplatelet due to persistently low platelet counts.

## 2022-07-23 NOTE — Assessment & Plan Note (Signed)
She was unaware of this diagnosis and counseling given. She should hold pressure to any wounds for 10-15 minutes and monitor for bleeding. Recent CBC stable.

## 2022-07-23 NOTE — Assessment & Plan Note (Signed)
She is seeing oncology for surveillance and this is stable at this time.

## 2022-07-23 NOTE — Assessment & Plan Note (Signed)
We will increase linzess to 290 mcg daily and new rx done to help relieve constipation symptoms. Her bleeding with BM is likely hemorrhoid and low platelet related. She was unaware of her low platelets explanation given.

## 2022-08-03 ENCOUNTER — Emergency Department (HOSPITAL_COMMUNITY): Payer: Medicare Other

## 2022-08-03 ENCOUNTER — Other Ambulatory Visit: Payer: Self-pay

## 2022-08-03 ENCOUNTER — Inpatient Hospital Stay (HOSPITAL_COMMUNITY)
Admission: EM | Admit: 2022-08-03 | Discharge: 2022-08-12 | DRG: 092 | Disposition: A | Discharge status: Skilled Nursing Facility | Payer: Medicare Other | Attending: Internal Medicine | Admitting: Internal Medicine

## 2022-08-03 ENCOUNTER — Encounter (HOSPITAL_COMMUNITY): Payer: Self-pay

## 2022-08-03 DIAGNOSIS — N390 Urinary tract infection, site not specified: Secondary | ICD-10-CM | POA: Diagnosis present

## 2022-08-03 DIAGNOSIS — R453 Demoralization and apathy: Secondary | ICD-10-CM | POA: Diagnosis present

## 2022-08-03 DIAGNOSIS — N3000 Acute cystitis without hematuria: Secondary | ICD-10-CM | POA: Diagnosis not present

## 2022-08-03 DIAGNOSIS — Z751 Person awaiting admission to adequate facility elsewhere: Secondary | ICD-10-CM

## 2022-08-03 DIAGNOSIS — W01190A Fall on same level from slipping, tripping and stumbling with subsequent striking against furniture, initial encounter: Secondary | ICD-10-CM | POA: Diagnosis present

## 2022-08-03 DIAGNOSIS — E876 Hypokalemia: Secondary | ICD-10-CM | POA: Diagnosis not present

## 2022-08-03 DIAGNOSIS — S0101XA Laceration without foreign body of scalp, initial encounter: Secondary | ICD-10-CM | POA: Diagnosis present

## 2022-08-03 DIAGNOSIS — Y93E2 Activity, laundry: Secondary | ICD-10-CM

## 2022-08-03 DIAGNOSIS — Z8673 Personal history of transient ischemic attack (TIA), and cerebral infarction without residual deficits: Secondary | ICD-10-CM

## 2022-08-03 DIAGNOSIS — Z803 Family history of malignant neoplasm of breast: Secondary | ICD-10-CM

## 2022-08-03 DIAGNOSIS — R5381 Other malaise: Secondary | ICD-10-CM | POA: Diagnosis present

## 2022-08-03 DIAGNOSIS — S0003XA Contusion of scalp, initial encounter: Secondary | ICD-10-CM | POA: Diagnosis not present

## 2022-08-03 DIAGNOSIS — K219 Gastro-esophageal reflux disease without esophagitis: Secondary | ICD-10-CM | POA: Diagnosis present

## 2022-08-03 DIAGNOSIS — E785 Hyperlipidemia, unspecified: Secondary | ICD-10-CM | POA: Diagnosis not present

## 2022-08-03 DIAGNOSIS — Z85841 Personal history of malignant neoplasm of brain: Secondary | ICD-10-CM

## 2022-08-03 DIAGNOSIS — F201 Disorganized schizophrenia: Secondary | ICD-10-CM | POA: Diagnosis not present

## 2022-08-03 DIAGNOSIS — Z602 Problems related to living alone: Secondary | ICD-10-CM | POA: Diagnosis present

## 2022-08-03 DIAGNOSIS — F209 Schizophrenia, unspecified: Secondary | ICD-10-CM | POA: Diagnosis present

## 2022-08-03 DIAGNOSIS — Z5901 Sheltered homelessness: Secondary | ICD-10-CM

## 2022-08-03 DIAGNOSIS — E162 Hypoglycemia, unspecified: Secondary | ICD-10-CM | POA: Diagnosis not present

## 2022-08-03 DIAGNOSIS — Z85820 Personal history of malignant melanoma of skin: Secondary | ICD-10-CM

## 2022-08-03 DIAGNOSIS — D638 Anemia in other chronic diseases classified elsewhere: Secondary | ICD-10-CM | POA: Diagnosis present

## 2022-08-03 DIAGNOSIS — E860 Dehydration: Secondary | ICD-10-CM | POA: Diagnosis not present

## 2022-08-03 DIAGNOSIS — Z833 Family history of diabetes mellitus: Secondary | ICD-10-CM

## 2022-08-03 DIAGNOSIS — Z9221 Personal history of antineoplastic chemotherapy: Secondary | ICD-10-CM

## 2022-08-03 DIAGNOSIS — F250 Schizoaffective disorder, bipolar type: Secondary | ICD-10-CM | POA: Diagnosis not present

## 2022-08-03 DIAGNOSIS — R296 Repeated falls: Principal | ICD-10-CM | POA: Diagnosis present

## 2022-08-03 DIAGNOSIS — W19XXXA Unspecified fall, initial encounter: Secondary | ICD-10-CM | POA: Diagnosis present

## 2022-08-03 DIAGNOSIS — R269 Unspecified abnormalities of gait and mobility: Secondary | ICD-10-CM | POA: Diagnosis not present

## 2022-08-03 DIAGNOSIS — S0990XA Unspecified injury of head, initial encounter: Secondary | ICD-10-CM | POA: Diagnosis not present

## 2022-08-03 DIAGNOSIS — Z8249 Family history of ischemic heart disease and other diseases of the circulatory system: Secondary | ICD-10-CM

## 2022-08-03 DIAGNOSIS — E872 Acidosis, unspecified: Secondary | ICD-10-CM | POA: Diagnosis not present

## 2022-08-03 DIAGNOSIS — Z79899 Other long term (current) drug therapy: Secondary | ICD-10-CM

## 2022-08-03 DIAGNOSIS — I6782 Cerebral ischemia: Secondary | ICD-10-CM | POA: Diagnosis not present

## 2022-08-03 DIAGNOSIS — R531 Weakness: Secondary | ICD-10-CM | POA: Diagnosis not present

## 2022-08-03 DIAGNOSIS — R262 Difficulty in walking, not elsewhere classified: Secondary | ICD-10-CM | POA: Diagnosis not present

## 2022-08-03 DIAGNOSIS — R6889 Other general symptoms and signs: Secondary | ICD-10-CM | POA: Diagnosis not present

## 2022-08-03 DIAGNOSIS — Z791 Long term (current) use of non-steroidal anti-inflammatories (NSAID): Secondary | ICD-10-CM

## 2022-08-03 DIAGNOSIS — Z808 Family history of malignant neoplasm of other organs or systems: Secondary | ICD-10-CM

## 2022-08-03 DIAGNOSIS — Y9259 Other trade areas as the place of occurrence of the external cause: Secondary | ICD-10-CM

## 2022-08-03 LAB — COMPREHENSIVE METABOLIC PANEL
ALT: 27 U/L (ref 0–44)
AST: 50 U/L — ABNORMAL HIGH (ref 15–41)
Albumin: 4.4 g/dL (ref 3.5–5.0)
Alkaline Phosphatase: 91 U/L (ref 38–126)
Anion gap: 19 — ABNORMAL HIGH (ref 5–15)
BUN: 23 mg/dL — ABNORMAL HIGH (ref 6–20)
CO2: 19 mmol/L — ABNORMAL LOW (ref 22–32)
Calcium: 9.6 mg/dL (ref 8.9–10.3)
Chloride: 100 mmol/L (ref 98–111)
Creatinine, Ser: 0.86 mg/dL (ref 0.44–1.00)
GFR, Estimated: >60 mL/min (ref >60)
Glucose, Bld: 49 mg/dL — ABNORMAL LOW (ref 70–99)
Potassium: 3.3 mmol/L — ABNORMAL LOW (ref 3.5–5.1)
Sodium: 138 mmol/L (ref 135–145)
Total Bilirubin: 1.7 mg/dL — ABNORMAL HIGH (ref 0.3–1.2)
Total Protein: 7.5 g/dL (ref 6.5–8.1)

## 2022-08-03 LAB — CBC WITH DIFFERENTIAL/PLATELET
Abs Immature Granulocytes: 0.01 10*3/uL (ref 0.00–0.07)
Basophils Absolute: 0 10*3/uL (ref 0.0–0.1)
Basophils Relative: 0 %
Eosinophils Absolute: 0 10*3/uL (ref 0.0–0.5)
Eosinophils Relative: 0 %
HCT: 42 % (ref 36.0–46.0)
Hemoglobin: 13.5 g/dL (ref 12.0–15.0)
Immature Granulocytes: 0 %
Lymphocytes Relative: 20 %
Lymphs Abs: 0.9 10*3/uL (ref 0.7–4.0)
MCH: 26.9 pg (ref 26.0–34.0)
MCHC: 32.1 g/dL (ref 30.0–36.0)
MCV: 83.7 fL (ref 80.0–100.0)
Monocytes Absolute: 0.4 10*3/uL (ref 0.1–1.0)
Monocytes Relative: 9 %
Neutro Abs: 3.3 10*3/uL (ref 1.7–7.7)
Neutrophils Relative %: 71 %
Platelets: 44 10*3/uL — ABNORMAL LOW (ref 150–400)
RBC: 5.02 MIL/uL (ref 3.87–5.11)
RDW: 13.2 % (ref 11.5–15.5)
WBC: 4.6 10*3/uL (ref 4.0–10.5)
nRBC: 0 % (ref 0.0–0.2)

## 2022-08-03 LAB — I-STAT CHEM 8, ED
BUN: 25 mg/dL — ABNORMAL HIGH (ref 6–20)
Calcium, Ion: 1.1 mmol/L — ABNORMAL LOW (ref 1.15–1.40)
Chloride: 102 mmol/L (ref 98–111)
Creatinine, Ser: 0.6 mg/dL (ref 0.44–1.00)
Glucose, Bld: 46 mg/dL — ABNORMAL LOW (ref 70–99)
HCT: 41 % (ref 36.0–46.0)
Hemoglobin: 13.9 g/dL (ref 12.0–15.0)
Potassium: 3.6 mmol/L (ref 3.5–5.1)
Sodium: 139 mmol/L (ref 135–145)
TCO2: 21 mmol/L — ABNORMAL LOW (ref 22–32)

## 2022-08-03 LAB — CBG MONITORING, ED
Glucose-Capillary: 45 mg/dL — ABNORMAL LOW (ref 70–99)
Glucose-Capillary: 70 mg/dL (ref 70–99)
Glucose-Capillary: 165 mg/dL — ABNORMAL HIGH (ref 70–99)

## 2022-08-03 LAB — CK: Total CK: 416 U/L — ABNORMAL HIGH (ref 38–234)

## 2022-08-03 MED ORDER — SODIUM CHLORIDE 0.9 % IV BOLUS
1000.0000 mL | Freq: Once | INTRAVENOUS | Status: AC
  Administered 2022-08-03: 1000 mL via INTRAVENOUS

## 2022-08-03 MED ORDER — POLYETHYLENE GLYCOL 3350 17 G PO PACK: 17.0000 g | PACK | Freq: Every day | ORAL | Status: DC | PRN

## 2022-08-03 MED ORDER — PROCHLORPERAZINE EDISYLATE 10 MG/2ML IJ SOLN: 5.0000 mg | Freq: Four times a day (QID) | INTRAMUSCULAR | Status: DC | PRN

## 2022-08-03 MED ORDER — GADOBUTROL 1 MMOL/ML IV SOLN
6.0000 mL | Freq: Once | INTRAVENOUS | Status: AC | PRN
  Administered 2022-08-03: 6 mL via INTRAVENOUS

## 2022-08-03 MED ORDER — ACETAMINOPHEN 325 MG PO TABS
650.0000 mg | ORAL_TABLET | Freq: Four times a day (QID) | ORAL | Status: DC | PRN
  Administered 2022-08-10 – 2022-08-11 (×2): 650 mg via ORAL
  Filled 2022-08-03 (×2): qty 2

## 2022-08-03 MED ORDER — OXYCODONE HCL 5 MG PO TABS
5.0000 mg | ORAL_TABLET | Freq: Four times a day (QID) | ORAL | Status: DC | PRN
  Administered 2022-08-07: 5 mg via ORAL
  Filled 2022-08-03: qty 1

## 2022-08-03 NOTE — ED Triage Notes (Signed)
Pt BIB [facility] from a hotel c/o a fall that happened yesterday where she hit her head on a bedside table. Pt has a possible laceration to the back of her head but there is a lot of dried blood and therefore EMS was unable to tell.

## 2022-08-03 NOTE — H&P (Signed)
History and Physical  Cayden Rautio WIO:[id number] DOB: 12-10-1970 DOA: 08/03/2022  Referring physician: Marin Comment, PA=EDP  PCP: Hoyt Koch, MD  Outpatient Specialists: Neurology, medical oncology, GI. Patient coming from: Lives in a hotel.  Chief Complaint: Multiple falls.  HPI: Gracie Gupta is a 52 y.o. female with medical history significant for melanoma with brain metastasis status postchemotherapy and whole brain radiation, anemia of chronic disease, chronic thrombocytopenia, GERD, prior stroke, who presented to [facility] ED from [facility] room where she resides, due to recurrent falls.  Reports she had a fall yesterday when she was reaching for her laundry, she lost her balance and fell backward hitting the back of her head to a table.  Reports intermittent dizziness and headache after the fall.  No nausea, vomiting, or change in vision.  EMS was activated and she was brought into the ED for further evaluation.  In the ED, noted to have laceration in the back of her head, status post 2 staples placed by EDP.  CT head and MRI brain revealed old CVA.  No acute intracranial findings.  No acute infarct or bleeding.  Lab studies notable for high BUN:CR ratio, high anion gap metabolic acidosis with serum bicarb of 19 and anion gap of 19.  AST 50, T. bili 1.7.  CPK 416.  Serum glucose 49.  Platelet count 44.  The patient was started on gentle IV fluid hydration.  Staff in the ED attempted to ambulate the patient.  She needed maximal assistance.  EDP requested admission due to dehydration, hypoglycemia, and acute ambulatory dysfunction.  The patient was admitted by [facility], hospitalist service.  ED Course: Tmax 98.3.  BP 160/77, pulse 78, respiratory 18, saturation 98% on room air.  Lab studies remarkable for serum potassium 3.3, bicarb 19, glucose 49.  Review of Systems: Review of systems as noted in the HPI. All other systems reviewed and are negative.   Past Medical History:  Diagnosis  Date   Allergy    Anemia    Gallstones    GERD (gastroesophageal reflux disease)    Hemorrhoids    melanoma with met dz dx'd 01/2018   brain and adrenal   Seasonal allergies    Stroke [facility])    Past Surgical History:  Procedure Laterality Date   BIOPSY  01/28/2018   Procedure: BIOPSY;  Surgeon: Laurence Spates, MD;  Location: [facility];  Service: Endoscopy;;   BREAST BIOPSY Left    ESOPHAGOGASTRODUODENOSCOPY N/A 01/28/2018   Procedure: ESOPHAGOGASTRODUODENOSCOPY (EGD);  Surgeon: Laurence Spates, MD;  Location: [facility];  Service: Endoscopy;  Laterality: N/A;   MOUTH SURGERY     OVARY SURGERY     removed "something"    Social History:  reports that she has never smoked. She has never used smokeless tobacco. She reports that she does not drink alcohol and does not use drugs.   No Known Allergies  Family History  Problem Relation Age of Onset   Melanoma Mother    Hypertension Father    Breast cancer Maternal Aunt    Breast cancer Paternal Aunt    Diabetes Paternal Aunt    Breast cancer Maternal Grandmother    Breast cancer Paternal Grandmother    Diabetes Paternal Grandmother    Colon cancer Neg Hx    Esophageal cancer Neg Hx    Pancreatic cancer Neg Hx    Stomach cancer Neg Hx    Liver disease Neg Hx    Rectal cancer Neg Hx  Prior to Admission medications   Medication Sig Start Date End Date Taking? Authorizing Provider  fluPHENAZine decanoate (PROLIXIN) 25 MG/ML injection Inject 1 mL (25 mg total) into the muscle every 14 (fourteen) days. 06/14/22   Malvin Johns, MD  linaclotide Rolan Lipa) 290 MCG CAPS capsule Take 1 capsule (290 mcg total) by mouth daily before breakfast. 07/21/22   Hoyt Koch, MD  meloxicam (MOBIC) 15 MG tablet Take 1 tablet (15 mg total) by mouth daily. 05/25/22   Orpah Greek, MD  omeprazole (PRILOSEC) 20 MG capsule Take 1 capsule (20 mg total) by mouth in the morning. 06/14/22   Malvin Johns, MD  risperiDONE  (RISPERDAL) 1 MG tablet Take 1 mg by mouth 2 (two) times daily. 07/07/22   [provider]  rosuvastatin (CRESTOR) 40 MG tablet Take 1 tablet (40 mg total) by mouth daily. 06/14/22   Malvin Johns, MD    Physical Exam: BP 116/77   Pulse 78   Temp 97.9 F (36.6 C)   Resp 18   SpO2 97%   General: 52 y.o. year-old female well developed well nourished in no acute distress.  Alert and oriented x3. Cardiovascular: Regular rate and rhythm with no rubs or gallops.  No thyromegaly or JVD noted.  No lower extremity edema. 2/4 pulses in all 4 extremities. Respiratory: Clear to auscultation with no wheezes or rales. Good inspiratory effort. Abdomen: Soft nontender nondistended with normal bowel sounds x4 quadrants. Muskuloskeletal: No cyanosis, clubbing or edema noted bilaterally Neuro: CN II-XII intact, strength, sensation, reflexes Skin: No ulcerative lesions noted or rashes Psychiatry: Judgement and insight appear normal. Mood is appropriate for condition and setting          Labs on Admission:  Basic Metabolic Panel: Recent Labs  Lab 08/03/22 1557 08/03/22 1613  NA 138 139  K 3.3* 3.6  CL 100 102  CO2 19*  --   GLUCOSE 49* 46*  BUN 23* 25*  CREATININE 0.86 0.60  CALCIUM 9.6  --    Liver Function Tests: Recent Labs  Lab 08/03/22 1557  AST 50*  ALT 27  ALKPHOS 91  BILITOT 1.7*  PROT 7.5  ALBUMIN 4.4   No results for input(s): "LIPASE", "AMYLASE" in the last 168 hours. No results for input(s): "AMMONIA" in the last 168 hours. CBC: Recent Labs  Lab 08/03/22 1557 08/03/22 1613  WBC 4.6  --   NEUTROABS 3.3  --   HGB 13.5 13.9  HCT 42.0 41.0  MCV 83.7  --   PLT 44*  --    Cardiac Enzymes: Recent Labs  Lab 08/03/22 1557  CKTOTAL 416*    BNP (last 3 results) No results for input(s): "BNP" in the last 8760 hours.  ProBNP (last 3 results) No results for input(s): "PROBNP" in the last 8760 hours.  CBG: Recent Labs  Lab 08/03/22 2052 08/03/22 2117  08/03/22 2224  GLUCAP 56* 70 165*    Radiological Exams on Admission: MR Brain W and Wo Contrast  Result Date: 08/03/2022 CLINICAL DATA:  Fall EXAM: MRI HEAD WITHOUT AND WITH CONTRAST TECHNIQUE: Multiplanar, multiecho pulse sequences of the brain and surrounding structures were obtained without and with intravenous contrast. CONTRAST:  32m GADAVIST GADOBUTROL 1 MMOL/ML IV SOLN COMPARISON:  04/08/2022 FINDINGS: Brain: No acute infarct, mass effect or extra-axial collection. Fewer than 5 scattered microhemorrhages in a nonspecific pattern. There is confluent hyperintense T2-weighted signal within the white matter. Generalized volume loss. Old small vessel infarcts of the right cerebellum and left  thalamus. The midline structures are normal. There is no abnormal contrast enhancement. Vascular: Normal flow voids. Skull and upper cervical spine: Right posterior scalp hematoma. Sinuses/Orbits: Atelectatic left maxillary sinus. No mastoid effusion. Normal orbits. Other: None. IMPRESSION: 1. No acute intracranial abnormality. 2. Old small vessel infarcts of the right cerebellum and left thalamus. 3. Confluent hyperintense T2-weighted signal within the white matter, consistent with chronic microvascular ischemia. Electronically Signed   By: Ulyses Jarred M.D.   On: 08/03/2022 20:16   CT Head Wo Contrast  Result Date: 08/03/2022 CLINICAL DATA:  Head trauma.  Moderate-severe. EXAM: CT HEAD WITHOUT CONTRAST TECHNIQUE: Contiguous axial images were obtained from the base of the skull through the vertex without intravenous contrast. RADIATION DOSE REDUCTION: This exam was performed according to the departmental dose-optimization program which includes automated exposure control, adjustment of the mA and/or kV according to patient size and/or use of iterative reconstruction technique. COMPARISON:  07/08/2022 FINDINGS: Brain: Newly seen low-density in the inferior cerebellum on the right concerning for an acute or  subacute right cerebellar infarction. Old infarction of the left posterolateral thalamus which was acute in October of last year. Chronic small-vessel ischemic changes elsewhere throughout the cerebral hemispheric white matter. No mass lesion, hemorrhage, hydrocephalus or extra-axial fluid collection. Vascular: No abnormal vascular finding. Skull: Normal Sinuses/Orbits: Clear/normal Other: None IMPRESSION: 1. Newly seen low-density in the inferior cerebellum on the right concerning for an acute or subacute infarction. Consider MRI for further evaluation. 2. Old infarction of the left posterolateral thalamus which was acute in October of last year. Chronic small-vessel ischemic changes elsewhere throughout the cerebral hemispheric white matter. Electronically Signed   By: Nelson Chimes M.D.   On: 08/03/2022 14:17    EKG: I independently viewed the EKG done and my findings are as followed: None available at the time of this dictation.  Assessment/Plan Present on Admission:  Falls  Principal Problem:   Falls  Recurrent falls. Presumptive UTI, POA History of CVA Treat underlying conditions PT OT assessment Fall precautions  Hypoglycemia secondary to inadequate oral caloric intake Serum glucose 49 Regular diet Avoid hypoglycemia  Dehydration Renal function is intact IV fluid hydration  Presumptive UTI, POA UA positive for pyuria Started on Rocephin Follow urine culture for ID and sensitivities  High anion gap metabolic acidosis Serum bicarb 19, anion gap 19 IV fluid hydration Repeat CMP in the morning.  Elevated liver chemistries AST 50, T. bili 1.7 Avoid hepatotoxic agents Repeat CMP  Elevated CPK from multiple falls CPK 416 Continue maintenance IV fluid hydration  Schizophrenia Resume home regimen.  GERD Resume home regimen.   DVT prophylaxis: Subcu Lovenox daily  Code Status: Full code  Family Communication: None at bedside   Disposition Plan: Admitted to  MedSurg unit  Consults called: None.  Admission status: Observation status.   Status is: Observation    Kayleen Memos MD [facility] Pager [phone]  If 7PM-7AM, please contact night-coverage [email or web address] Password [facility]  08/03/2022, 11:41 PM

## 2022-08-03 NOTE — ED Provider Notes (Signed)
[facility] Provider Note   CSN: [id number] Arrival date & time: 08/03/22  1331     History  Chief Complaint  Patient presents with   Kaitlyn Duncan is a 52 y.o. female with a past medical history of metastatic melanoma and mets to brain status post chemo whole brain radiation in remission, stroke presenting today for evaluation after fall.  Patient reports yesterday when she was trying to reach her laundry back to roll backwards, patient lost balance and fell backwards hitting the back of her head to a table.  Patient reports headache, dizziness after the fall.  No fever, nausea, vomiting, vision changes.   Fall    Past Medical History:  Diagnosis Date   Allergy    Anemia    Gallstones    GERD (gastroesophageal reflux disease)    Hemorrhoids    melanoma with met dz dx'd 01/2018   brain and adrenal   Seasonal allergies    Stroke [facility])    Past Surgical History:  Procedure Laterality Date   BIOPSY  01/28/2018   Procedure: BIOPSY;  Surgeon: Laurence Spates, MD;  Location: [facility] ENDOSCOPY;  Service: Endoscopy;;   BREAST BIOPSY Left    ESOPHAGOGASTRODUODENOSCOPY N/A 01/28/2018   Procedure: ESOPHAGOGASTRODUODENOSCOPY (EGD);  Surgeon: Laurence Spates, MD;  Location: [facility];  Service: Endoscopy;  Laterality: N/A;   MOUTH SURGERY     OVARY SURGERY     removed "something"     Home Medications Prior to Admission medications   Medication Sig Start Date End Date Taking? Authorizing Provider  fluPHENAZine decanoate (PROLIXIN) 25 MG/ML injection Inject 1 mL (25 mg total) into the muscle every 14 (fourteen) days. 06/14/22   Malvin Johns, MD  linaclotide Rolan Lipa) 290 MCG CAPS capsule Take 1 capsule (290 mcg total) by mouth daily before breakfast. 07/21/22   Hoyt Koch, MD  meloxicam (MOBIC) 15 MG tablet Take 1 tablet (15 mg total) by mouth daily. 05/25/22   Orpah Greek, MD  omeprazole (PRILOSEC) 20  MG capsule Take 1 capsule (20 mg total) by mouth in the morning. 06/14/22   Malvin Johns, MD  risperiDONE (RISPERDAL) 1 MG tablet Take 1 mg by mouth 2 (two) times daily. 07/07/22   [provider]  rosuvastatin (CRESTOR) 40 MG tablet Take 1 tablet (40 mg total) by mouth daily. 06/14/22   Malvin Johns, MD      Allergies    Patient has no known allergies.    Review of Systems   Review of Systems Negative except as per HPI.  Physical Exam Updated Vital Signs BP 116/77   Pulse 78   Temp 97.9 F (36.6 C)   Resp 18   SpO2 97%  Physical Exam Vitals and nursing note reviewed.  Constitutional:      Appearance: Normal appearance.  HENT:     Head: Normocephalic and atraumatic.     Comments: Dry blood in the back of head. No active bleeding noted.    Mouth/Throat:     Mouth: Mucous membranes are moist.  Eyes:     General: No scleral icterus. Cardiovascular:     Rate and Rhythm: Normal rate and regular rhythm.     Pulses: Normal pulses.     Heart sounds: Normal heart sounds.  Pulmonary:     Effort: Pulmonary effort is normal.     Breath sounds: Normal breath sounds.  Abdominal:     General: Abdomen is flat.  Palpations: Abdomen is soft.     Tenderness: There is no abdominal tenderness.  Musculoskeletal:        General: No deformity.  Skin:    General: Skin is warm.     Findings: No rash.  Neurological:     General: No focal deficit present.     Mental Status: She is alert.  Psychiatric:        Mood and Affect: Mood normal.     ED Results / Procedures / Treatments   Labs (all labs ordered are listed, but only abnormal results are displayed) Labs Reviewed  CBC WITH DIFFERENTIAL/PLATELET - Abnormal; Notable for the following components:      Result Value   Platelets 44 (*)    All other components within normal limits  COMPREHENSIVE METABOLIC PANEL - Abnormal; Notable for the following components:   Potassium 3.3 (*)    CO2 19 (*)    Glucose, Bld 49 (*)     BUN 23 (*)    AST 50 (*)    Total Bilirubin 1.7 (*)    Anion gap 19 (*)    All other components within normal limits  CK - Abnormal; Notable for the following components:   Total CK 416 (*)    All other components within normal limits  I-STAT CHEM 8, ED - Abnormal; Notable for the following components:   BUN 25 (*)    Glucose, Bld 46 (*)    Calcium, Ion 1.10 (*)    TCO2 21 (*)    All other components within normal limits  CBG MONITORING, ED - Abnormal; Notable for the following components:   Glucose-Capillary 45 (*)    All other components within normal limits  CBG MONITORING, ED - Abnormal; Notable for the following components:   Glucose-Capillary 165 (*)    All other components within normal limits  URINALYSIS, ROUTINE W REFLEX MICROSCOPIC  HIV ANTIBODY (ROUTINE TESTING W REFLEX)  CBG MONITORING, ED    EKG None  Radiology MR Brain W and Wo Contrast  Result Date: 08/03/2022 CLINICAL DATA:  Fall EXAM: MRI HEAD WITHOUT AND WITH CONTRAST TECHNIQUE: Multiplanar, multiecho pulse sequences of the brain and surrounding structures were obtained without and with intravenous contrast. CONTRAST:  71m GADAVIST GADOBUTROL 1 MMOL/ML IV SOLN COMPARISON:  04/08/2022 FINDINGS: Brain: No acute infarct, mass effect or extra-axial collection. Fewer than 5 scattered microhemorrhages in a nonspecific pattern. There is confluent hyperintense T2-weighted signal within the white matter. Generalized volume loss. Old small vessel infarcts of the right cerebellum and left thalamus. The midline structures are normal. There is no abnormal contrast enhancement. Vascular: Normal flow voids. Skull and upper cervical spine: Right posterior scalp hematoma. Sinuses/Orbits: Atelectatic left maxillary sinus. No mastoid effusion. Normal orbits. Other: None. IMPRESSION: 1. No acute intracranial abnormality. 2. Old small vessel infarcts of the right cerebellum and left thalamus. 3. Confluent hyperintense T2-weighted  signal within the white matter, consistent with chronic microvascular ischemia. Electronically Signed   By: KUlyses JarredM.D.   On: 08/03/2022 20:16   CT Head Wo Contrast  Result Date: 08/03/2022 CLINICAL DATA:  Head trauma.  Moderate-severe. EXAM: CT HEAD WITHOUT CONTRAST TECHNIQUE: Contiguous axial images were obtained from the base of the skull through the vertex without intravenous contrast. RADIATION DOSE REDUCTION: This exam was performed according to the departmental dose-optimization program which includes automated exposure control, adjustment of the mA and/or kV according to patient size and/or use of iterative reconstruction technique. COMPARISON:  07/08/2022 FINDINGS: Brain: Newly  seen low-density in the inferior cerebellum on the right concerning for an acute or subacute right cerebellar infarction. Old infarction of the left posterolateral thalamus which was acute in October of last year. Chronic small-vessel ischemic changes elsewhere throughout the cerebral hemispheric white matter. No mass lesion, hemorrhage, hydrocephalus or extra-axial fluid collection. Vascular: No abnormal vascular finding. Skull: Normal Sinuses/Orbits: Clear/normal Other: None IMPRESSION: 1. Newly seen low-density in the inferior cerebellum on the right concerning for an acute or subacute infarction. Consider MRI for further evaluation. 2. Old infarction of the left posterolateral thalamus which was acute in October of last year. Chronic small-vessel ischemic changes elsewhere throughout the cerebral hemispheric white matter. Electronically Signed   By: Nelson Chimes M.D.   On: 08/03/2022 14:17    Procedures .Marland KitchenLaceration Repair  Date/Time: 08/03/2022 9:27 PM  Performed by: Rex Kras, PA Authorized by: Rex Kras, PA   Consent:    Consent obtained:  Verbal   Consent given by:  Patient   Risks, benefits, and alternatives were discussed: yes     Risks discussed:  Pain and infection Universal protocol:    Procedure  explained and questions answered to patient or proxy's satisfaction: yes     Relevant documents present and verified: yes     Patient identity confirmed:  Verbally with patient and arm band Anesthesia:    Anesthesia method:  None Laceration details:    Location:  Scalp   Scalp location:  Mid-scalp   Length (cm):  2   Depth (mm):  1 Pre-procedure details:    Preparation:  Patient was prepped and draped in usual sterile fashion Exploration:    Limited defect created (wound extended): no     Hemostasis achieved with:  Direct pressure   Imaging outcome: foreign body not noted     Contaminated: no   Treatment:    Area cleansed with:  Saline   Amount of cleaning:  Standard   Irrigation solution:  Sterile saline   Irrigation volume:  100   Irrigation method:  Pressure wash   Visualized foreign bodies/material removed: no     Debridement:  None   Undermining:  None Skin repair:    Repair method:  Staples   Number of staples:  2 Approximation:    Approximation:  Close Repair type:    Repair type:  Simple Post-procedure details:    Dressing:  Antibiotic ointment and sterile dressing   Procedure completion:  Tolerated well, no immediate complications     Medications Ordered in ED Medications  acetaminophen (TYLENOL) tablet 650 mg (has no administration in time range)  oxyCODONE (Oxy IR/ROXICODONE) immediate release tablet 5 mg (has no administration in time range)  polyethylene glycol (MIRALAX / GLYCOLAX) packet 17 g (has no administration in time range)  prochlorperazine (COMPAZINE) injection 5 mg (has no administration in time range)  gadobutrol (GADAVIST) 1 MMOL/ML injection 6 mL (6 mLs Intravenous Contrast Given 08/03/22 1931)  sodium chloride 0.9 % bolus 1,000 mL (1,000 mLs Intravenous New Bag/Given 08/03/22 2303)    ED Course/ Medical Decision Making/ A&P                             Medical Decision Making Amount and/or Complexity of Data Reviewed Labs:  ordered. Radiology: ordered.  Risk Prescription drug management.   This patient presents to the ED for fall, this involves an extensive number of treatment options, and is a complaint that carries with a high risk  of complications and morbidity.  The differential diagnosis includes .  This is not an exhaustive list.  Lab tests: I ordered and personally interpreted labs.  The pertinent results include: WBC unremarkable. Hbg unremarkable. Anion gap 19. Platelets unremarkable. No electrolyte abnormalities noted. BUN 25, creatinine unremarkable. CK 416.  Imaging studies: I ordered imaging studies. I personally reviewed, interpreted imaging and agree with the radiologist's interpretations. The results include: CT and MRI head showed old small vessel infarction of the right cerebellum and old infection of the left thalamus.  Problem list/ ED course/ Critical interventions/ Medical management: HPI: See above Vital signs within normal range and stable throughout visit. Laboratory/imaging studies significant for: See above. On physical examination, patient is afebrile and appears in no acute distress.  Patient was ambulated in the room however she needed maximum assistance and cannot walk on her own.  I do think patient require admission for dehydration, elevated anion gap, hypoglycemia, postconcussive syndrome with persistent dizziness.  Normal saline bolus ordered.  I spoke to patient's legal guardian [name] and discussed lab and imaging results. She gave consent to admit patient to the hospital for further management. I have reviewed the patient home medicines and have made adjustments as needed.  Cardiac monitoring/EKG: The patient was maintained on a cardiac monitor.  I personally reviewed and interpreted the cardiac monitor which showed an underlying rhythm of: sinus rhythm.  Additional history obtained: External records from outside source obtained and reviewed including: Chart  review including previous notes, labs, imaging.  Consultations obtained: I requested consultation with Dr. Nevada Crane, and discussed lab and imaging findings as well as pertinent plan.  She recommended: admission to the hospital.  Disposition Patient is admitted to Med-Surg.  This chart was dictated using voice recognition software.  Despite best efforts to proofread,  errors can occur which can change the documentation meaning.          Final Clinical Impression(s) / ED Diagnoses Final diagnoses:  Fall, initial encounter  Injury of head, initial encounter  Weakness    Rx / DC Orders ED Discharge Orders     None         Rex Kras, [place] 08/03/22 2352    Drenda Freeze, MD 08/04/22 [phone]

## 2022-08-03 NOTE — ED Provider Triage Note (Signed)
Emergency Medicine Provider Triage Evaluation Note  Kaitlyn Duncan , a 52 y.o. female  was evaluated in triage.  Pt complains of falling yesterday and hitting the back of her head.  Patient states that she was moving a rolling laundry basket when it got loose and she accidentally stumbled falling to the ground.  She denies feeling lightheaded, dizzy, or losing consciousness.  She states that she was aware that she had a cut to the back of her head with the fall happened, however, she did not have a fall on her transportation to come be evaluated at that time.  She states that she has some localized pain to the back of her head but no other pain, injury or other symptoms today.  She denies taking anticoagulants.  Review of Systems  Positive: See HPI Negative: See HPI  Physical Exam  BP 107/85 (BP Location: Right Arm)   Pulse 80   Temp 98.3 F (36.8 C)   Resp 16   SpO2 99%  Gen:   Awake, no distress   Resp:  Normal effort  MSK:   Moves extremities without difficulty  Other:  Large area of dried blood to posterior scalp making it difficult to visualize any laceration or other wound but no active bleeding, alert and oriented, equal strength 5/5 bilaterally, CNI, normal speech, PERRL  Medical Decision Making  Medically screening exam initiated at 1:50 PM.  Appropriate orders placed.  Kaitlyn Duncan was informed that the remainder of the evaluation will be completed by another provider, this initial triage assessment does not replace that evaluation, and the importance of remaining in the ED until their evaluation is complete.     Suzzette Righter, PA-C 08/03/22 1352

## 2022-08-03 NOTE — ED Notes (Signed)
Pt needed maximum assistance during ambulation. Pt was unable to move by herself. Pt had difficulty moving her right foot forward. Pt placed into bed at the lowest level, call light within reach.

## 2022-08-04 DIAGNOSIS — E872 Acidosis, unspecified: Secondary | ICD-10-CM | POA: Diagnosis present

## 2022-08-04 DIAGNOSIS — Z79899 Other long term (current) drug therapy: Secondary | ICD-10-CM | POA: Diagnosis not present

## 2022-08-04 DIAGNOSIS — E860 Dehydration: Secondary | ICD-10-CM | POA: Diagnosis present

## 2022-08-04 DIAGNOSIS — Z7401 Bed confinement status: Secondary | ICD-10-CM | POA: Diagnosis not present

## 2022-08-04 DIAGNOSIS — Z5901 Sheltered homelessness: Secondary | ICD-10-CM | POA: Diagnosis not present

## 2022-08-04 DIAGNOSIS — N390 Urinary tract infection, site not specified: Secondary | ICD-10-CM | POA: Diagnosis present

## 2022-08-04 DIAGNOSIS — D696 Thrombocytopenia, unspecified: Secondary | ICD-10-CM | POA: Diagnosis not present

## 2022-08-04 DIAGNOSIS — R262 Difficulty in walking, not elsewhere classified: Secondary | ICD-10-CM

## 2022-08-04 DIAGNOSIS — Y93E2 Activity, laundry: Secondary | ICD-10-CM | POA: Diagnosis not present

## 2022-08-04 DIAGNOSIS — Z85820 Personal history of malignant melanoma of skin: Secondary | ICD-10-CM | POA: Diagnosis not present

## 2022-08-04 DIAGNOSIS — Z8249 Family history of ischemic heart disease and other diseases of the circulatory system: Secondary | ICD-10-CM | POA: Diagnosis not present

## 2022-08-04 DIAGNOSIS — F209 Schizophrenia, unspecified: Secondary | ICD-10-CM | POA: Diagnosis not present

## 2022-08-04 DIAGNOSIS — K219 Gastro-esophageal reflux disease without esophagitis: Secondary | ICD-10-CM | POA: Diagnosis not present

## 2022-08-04 DIAGNOSIS — Z8673 Personal history of transient ischemic attack (TIA), and cerebral infarction without residual deficits: Secondary | ICD-10-CM | POA: Diagnosis not present

## 2022-08-04 DIAGNOSIS — Z85841 Personal history of malignant neoplasm of brain: Secondary | ICD-10-CM | POA: Diagnosis not present

## 2022-08-04 DIAGNOSIS — I679 Cerebrovascular disease, unspecified: Secondary | ICD-10-CM | POA: Diagnosis not present

## 2022-08-04 DIAGNOSIS — Y9259 Other trade areas as the place of occurrence of the external cause: Secondary | ICD-10-CM | POA: Diagnosis not present

## 2022-08-04 DIAGNOSIS — Z791 Long term (current) use of non-steroidal anti-inflammatories (NSAID): Secondary | ICD-10-CM | POA: Diagnosis not present

## 2022-08-04 DIAGNOSIS — N3000 Acute cystitis without hematuria: Secondary | ICD-10-CM | POA: Diagnosis not present

## 2022-08-04 DIAGNOSIS — R488 Other symbolic dysfunctions: Secondary | ICD-10-CM | POA: Diagnosis not present

## 2022-08-04 DIAGNOSIS — F250 Schizoaffective disorder, bipolar type: Secondary | ICD-10-CM | POA: Diagnosis not present

## 2022-08-04 DIAGNOSIS — C7931 Secondary malignant neoplasm of brain: Secondary | ICD-10-CM | POA: Diagnosis not present

## 2022-08-04 DIAGNOSIS — S0101XA Laceration without foreign body of scalp, initial encounter: Secondary | ICD-10-CM | POA: Diagnosis present

## 2022-08-04 DIAGNOSIS — R5381 Other malaise: Secondary | ICD-10-CM | POA: Diagnosis present

## 2022-08-04 DIAGNOSIS — D638 Anemia in other chronic diseases classified elsewhere: Secondary | ICD-10-CM | POA: Diagnosis not present

## 2022-08-04 DIAGNOSIS — K5900 Constipation, unspecified: Secondary | ICD-10-CM | POA: Diagnosis not present

## 2022-08-04 DIAGNOSIS — R453 Demoralization and apathy: Secondary | ICD-10-CM | POA: Diagnosis present

## 2022-08-04 DIAGNOSIS — Z9221 Personal history of antineoplastic chemotherapy: Secondary | ICD-10-CM | POA: Diagnosis not present

## 2022-08-04 DIAGNOSIS — W19XXXA Unspecified fall, initial encounter: Secondary | ICD-10-CM | POA: Diagnosis not present

## 2022-08-04 DIAGNOSIS — R269 Unspecified abnormalities of gait and mobility: Secondary | ICD-10-CM | POA: Diagnosis present

## 2022-08-04 DIAGNOSIS — R278 Other lack of coordination: Secondary | ICD-10-CM | POA: Diagnosis not present

## 2022-08-04 DIAGNOSIS — E876 Hypokalemia: Secondary | ICD-10-CM | POA: Diagnosis not present

## 2022-08-04 DIAGNOSIS — F201 Disorganized schizophrenia: Secondary | ICD-10-CM | POA: Diagnosis not present

## 2022-08-04 DIAGNOSIS — W01190A Fall on same level from slipping, tripping and stumbling with subsequent striking against furniture, initial encounter: Secondary | ICD-10-CM | POA: Diagnosis present

## 2022-08-04 DIAGNOSIS — E785 Hyperlipidemia, unspecified: Secondary | ICD-10-CM | POA: Diagnosis not present

## 2022-08-04 DIAGNOSIS — R531 Weakness: Secondary | ICD-10-CM | POA: Diagnosis not present

## 2022-08-04 DIAGNOSIS — M6281 Muscle weakness (generalized): Secondary | ICD-10-CM | POA: Diagnosis not present

## 2022-08-04 DIAGNOSIS — R296 Repeated falls: Secondary | ICD-10-CM | POA: Diagnosis not present

## 2022-08-04 DIAGNOSIS — M25571 Pain in right ankle and joints of right foot: Secondary | ICD-10-CM | POA: Diagnosis not present

## 2022-08-04 DIAGNOSIS — E162 Hypoglycemia, unspecified: Secondary | ICD-10-CM | POA: Diagnosis present

## 2022-08-04 LAB — CBC
HCT: 35.5 % — ABNORMAL LOW (ref 36.0–46.0)
Hemoglobin: 12.3 g/dL (ref 12.0–15.0)
MCH: 27.9 pg (ref 26.0–34.0)
MCHC: 34.6 g/dL (ref 30.0–36.0)
MCV: 80.5 fL (ref 80.0–100.0)
Platelets: 38 10*3/uL — ABNORMAL LOW (ref 150–400)
RBC: 4.41 MIL/uL (ref 3.87–5.11)
RDW: 13.2 % (ref 11.5–15.5)
WBC: 5.5 10*3/uL (ref 4.0–10.5)
nRBC: 0 % (ref 0.0–0.2)

## 2022-08-04 LAB — URINALYSIS, ROUTINE W REFLEX MICROSCOPIC
Bilirubin Urine: NEGATIVE
Glucose, UA: NEGATIVE mg/dL
Hgb urine dipstick: NEGATIVE
Ketones, ur: 80 mg/dL — AB
Nitrite: NEGATIVE
Protein, ur: 100 mg/dL — AB
Specific Gravity, Urine: 1.039 — ABNORMAL HIGH (ref 1.005–1.030)
WBC, UA: >50 WBC/hpf (ref 0–5)
pH: 5 (ref 5.0–8.0)

## 2022-08-04 LAB — URINALYSIS, W/ REFLEX TO CULTURE (INFECTION SUSPECTED)
Bacteria, UA: NONE SEEN
Bilirubin Urine: NEGATIVE
Glucose, UA: NEGATIVE mg/dL
Hgb urine dipstick: NEGATIVE
Ketones, ur: 80 mg/dL — AB
Nitrite: NEGATIVE
Protein, ur: 100 mg/dL — AB
Specific Gravity, Urine: 1.025 (ref 1.005–1.030)
pH: 6 (ref 5.0–8.0)

## 2022-08-04 LAB — COMPREHENSIVE METABOLIC PANEL
ALT: 23 U/L (ref 0–44)
AST: 38 U/L (ref 15–41)
Albumin: 3.6 g/dL (ref 3.5–5.0)
Alkaline Phosphatase: 75 U/L (ref 38–126)
Anion gap: 15 (ref 5–15)
BUN: 11 mg/dL (ref 6–20)
CO2: 18 mmol/L — ABNORMAL LOW (ref 22–32)
Calcium: 8.5 mg/dL — ABNORMAL LOW (ref 8.9–10.3)
Chloride: 102 mmol/L (ref 98–111)
Creatinine, Ser: 0.89 mg/dL (ref 0.44–1.00)
GFR, Estimated: >60 mL/min (ref >60)
Glucose, Bld: 75 mg/dL (ref 70–99)
Potassium: 2.8 mmol/L — ABNORMAL LOW (ref 3.5–5.1)
Sodium: 135 mmol/L (ref 135–145)
Total Bilirubin: 1.2 mg/dL (ref 0.3–1.2)
Total Protein: 6.4 g/dL — ABNORMAL LOW (ref 6.5–8.1)

## 2022-08-04 LAB — MAGNESIUM: Magnesium: 1.6 mg/dL — ABNORMAL LOW (ref 1.7–2.4)

## 2022-08-04 MED ORDER — PANTOPRAZOLE SODIUM 40 MG PO TBEC
40.0000 mg | DELAYED_RELEASE_TABLET | Freq: Every day | ORAL | Status: DC
  Administered 2022-08-04: 40 mg via ORAL
  Filled 2022-08-04: qty 1

## 2022-08-04 MED ORDER — ROSUVASTATIN CALCIUM 20 MG PO TABS
40.0000 mg | ORAL_TABLET | Freq: Every day | ORAL | Status: DC
  Administered 2022-08-04 – 2022-08-12 (×9): 40 mg via ORAL
  Filled 2022-08-04 (×9): qty 2

## 2022-08-04 MED ORDER — LACTATED RINGERS IV SOLN: INTRAVENOUS | Status: DC

## 2022-08-04 MED ORDER — MAGNESIUM SULFATE 2 GM/50ML IV SOLN
2.0000 g | Freq: Once | INTRAVENOUS | Status: AC
  Administered 2022-08-04: 2 g via INTRAVENOUS
  Filled 2022-08-04: qty 50

## 2022-08-04 MED ORDER — POTASSIUM CHLORIDE CRYS ER 20 MEQ PO TBCR
40.0000 meq | EXTENDED_RELEASE_TABLET | ORAL | Status: AC
  Administered 2022-08-04 (×2): 40 meq via ORAL
  Filled 2022-08-04 (×2): qty 2

## 2022-08-04 MED ORDER — LINACLOTIDE 145 MCG PO CAPS
290.0000 ug | ORAL_CAPSULE | Freq: Every day | ORAL | Status: DC
  Administered 2022-08-04 – 2022-08-12 (×9): 290 ug via ORAL
  Filled 2022-08-04 (×9): qty 2

## 2022-08-04 MED ORDER — SODIUM CHLORIDE 0.9 % IV SOLN
2.0000 g | Freq: Every day | INTRAVENOUS | Status: DC
  Administered 2022-08-04 – 2022-08-06 (×4): 2 g via INTRAVENOUS
  Filled 2022-08-04 (×4): qty 20

## 2022-08-04 MED ORDER — RISPERIDONE 1 MG PO TABS
1.0000 mg | ORAL_TABLET | Freq: Two times a day (BID) | ORAL | Status: DC
  Administered 2022-08-04 – 2022-08-08 (×10): 1 mg via ORAL
  Filled 2022-08-04 (×11): qty 1

## 2022-08-04 NOTE — Progress Notes (Signed)
PROGRESS NOTE    Kaitlyn Duncan  OZD:[id number] DOB: January 30, 1971 DOA: 08/03/2022 PCP: Hoyt Koch, MD    Brief Narrative:  52 year old female with history of melanoma with brain mets status post chemotherapy and whole brain radiation, anemia of chronic disease, chronic thrombocytopenia, GERD and history of stroke presented from hotel room where she fell while she was trying to reach her laundry basket.  Reported mechanical fall.  Denied any dizziness lightheadedness.  In the emergency room skin survey was negative.  She had a scalp repaired with 2 staples.  CT head and MRI with old stroke no acute findings.  Admitted due to recurrent fall and unsafe discharge.  Urinalysis was abnormal.  Blood sugars were 49.   Assessment & Plan:   Physical deconditioning and debility, recurrent falls: No evidence of skeletal injury.  Has a history of stroke.  No acute neurological deficits but globally weak.  Work with PT OT to look for safe disposition. Scalp laceration.  Repaired.  Will need to remove the staples in 1 week.  Presumptive UTI: Urine cultures pending.  Continue Rocephin.  Hyperglycemia secondary to inadequate oral intake, moderate clinical dehydration, high anion gap metabolic acidosis: Blood sugars improved after regular intake.  Patient probably does not have adequate access to meal.  Social worker to evaluate. Continue maintenance IV fluids.  Encourage oral intake.  Hypokalemia and hypomagnesemia: Replace with IV and oral.  Schizophrenia: Take risperidone.  Continued.  Hyperlipidemia: Continue statin.   DVT prophylaxis: SCDs Start: 08/03/22 2341   Code Status: Full code Family Communication: None at bedside Disposition Plan: Status is: Observation The patient will require care spanning > 2 midnights and should be moved to inpatient because: Unsafe discharge disposition.  Antibiotics for UTI.     Consultants:  None  Procedures:  None  Antimicrobials:   Rocephin 1/30---   Subjective: Patient seen in the morning rounds.  She was hungry and looking for breakfast.  Denies any complaints today.  Not sure how strong she is to walk.  Objective: Vitals:   08/04/22 0055 08/04/22 0127 08/04/22 0518 08/04/22 0739  BP: 101/72 134/79 123/86 (!) 140/84  Pulse: 62 80 (!) 105 95  Resp: '16 18 18 17  '$ Temp: 97.8 F (36.6 C) 97.6 F (36.4 C) 97.7 F (36.5 C) 97.8 F (36.6 C)  TempSrc: Oral Oral Oral   SpO2: 99% 100% 100% 98%    Intake/Output Summary (Last 24 hours) at 08/04/2022 1131 Last data filed at 08/04/2022 0249 Gross per 24 hour  Intake 999 ml  Output 200 ml  Net 799 ml   There were no vitals filed for this visit.  Examination:  General exam: Appears calm and comfortable  Frail and debilitated.  Alert awake and oriented.  Flat affect.  On room air.  Not in any distress. Respiratory system: Clear to auscultation. Respiratory effort normal. Cardiovascular system: S1 & S2 heard, RRR. No pedal edema. Gastrointestinal system: Soft.  Nontender.  Bowel sound present. Central nervous system: Alert and oriented. No focal neurological deficits.  Globally weak. 2 staples intact, nontender clean and dry on the posterior scalp.    Data Reviewed: I have personally reviewed following labs and imaging studies  CBC: Recent Labs  Lab 08/03/22 1557 08/03/22 1613 08/04/22 0826  WBC 4.6  --  5.5  NEUTROABS 3.3  --   --   HGB 13.5 13.9 12.3  HCT 42.0 41.0 35.5*  MCV 83.7  --  80.5  PLT 44*  --  38*  Basic Metabolic Panel: Recent Labs  Lab 08/03/22 1557 08/03/22 1613 08/04/22 0826  NA 138 139 135  K 3.3* 3.6 2.8*  CL 100 102 102  CO2 19*  --  18*  GLUCOSE 49* 46* 75  BUN 23* 25* 11  CREATININE 0.86 0.60 0.89  CALCIUM 9.6  --  8.5*  MG  --   --  1.6*   GFR: CrCl cannot be calculated (Unknown ideal weight.). Liver Function Tests: Recent Labs  Lab 08/03/22 1557 08/04/22 0826  AST 50* 38  ALT 27 23  ALKPHOS 91 75   BILITOT 1.7* 1.2  PROT 7.5 6.4*  ALBUMIN 4.4 3.6   No results for input(s): "LIPASE", "AMYLASE" in the last 168 hours. No results for input(s): "AMMONIA" in the last 168 hours. Coagulation Profile: No results for input(s): "INR", "PROTIME" in the last 168 hours. Cardiac Enzymes: Recent Labs  Lab 08/03/22 1557  CKTOTAL 416*   BNP (last 3 results) No results for input(s): "PROBNP" in the last 8760 hours. HbA1C: No results for input(s): "HGBA1C" in the last 72 hours. CBG: Recent Labs  Lab 08/03/22 2052 08/03/22 2117 08/03/22 2224  GLUCAP 45* 70 165*   Lipid Profile: No results for input(s): "CHOL", "HDL", "LDLCALC", "TRIG", "CHOLHDL", "LDLDIRECT" in the last 72 hours. Thyroid Function Tests: No results for input(s): "TSH", "T4TOTAL", "FREET4", "T3FREE", "THYROIDAB" in the last 72 hours. Anemia Panel: No results for input(s): "VITAMINB12", "FOLATE", "FERRITIN", "TIBC", "IRON", "RETICCTPCT" in the last 72 hours. Sepsis Labs: No results for input(s): "PROCALCITON", "LATICACIDVEN" in the last 168 hours.  No results found for this or any previous visit (from the past 240 hour(s)).       Radiology Studies: MR Brain W and Wo Contrast  Result Date: 08/03/2022 CLINICAL DATA:  Fall EXAM: MRI HEAD WITHOUT AND WITH CONTRAST TECHNIQUE: Multiplanar, multiecho pulse sequences of the brain and surrounding structures were obtained without and with intravenous contrast. CONTRAST:  47m GADAVIST GADOBUTROL 1 MMOL/ML IV SOLN COMPARISON:  04/08/2022 FINDINGS: Brain: No acute infarct, mass effect or extra-axial collection. Fewer than 5 scattered microhemorrhages in a nonspecific pattern. There is confluent hyperintense T2-weighted signal within the white matter. Generalized volume loss. Old small vessel infarcts of the right cerebellum and left thalamus. The midline structures are normal. There is no abnormal contrast enhancement. Vascular: Normal flow voids. Skull and upper cervical spine: Right  posterior scalp hematoma. Sinuses/Orbits: Atelectatic left maxillary sinus. No mastoid effusion. Normal orbits. Other: None. IMPRESSION: 1. No acute intracranial abnormality. 2. Old small vessel infarcts of the right cerebellum and left thalamus. 3. Confluent hyperintense T2-weighted signal within the white matter, consistent with chronic microvascular ischemia. Electronically Signed   By: KUlyses JarredM.D.   On: 08/03/2022 20:16   CT Head Wo Contrast  Result Date: 08/03/2022 CLINICAL DATA:  Head trauma.  Moderate-severe. EXAM: CT HEAD WITHOUT CONTRAST TECHNIQUE: Contiguous axial images were obtained from the base of the skull through the vertex without intravenous contrast. RADIATION DOSE REDUCTION: This exam was performed according to the departmental dose-optimization program which includes automated exposure control, adjustment of the mA and/or kV according to patient size and/or use of iterative reconstruction technique. COMPARISON:  07/08/2022 FINDINGS: Brain: Newly seen low-density in the inferior cerebellum on the right concerning for an acute or subacute right cerebellar infarction. Old infarction of the left posterolateral thalamus which was acute in October of last year. Chronic small-vessel ischemic changes elsewhere throughout the cerebral hemispheric white matter. No mass lesion, hemorrhage, hydrocephalus or extra-axial fluid  collection. Vascular: No abnormal vascular finding. Skull: Normal Sinuses/Orbits: Clear/normal Other: None IMPRESSION: 1. Newly seen low-density in the inferior cerebellum on the right concerning for an acute or subacute infarction. Consider MRI for further evaluation. 2. Old infarction of the left posterolateral thalamus which was acute in October of last year. Chronic small-vessel ischemic changes elsewhere throughout the cerebral hemispheric white matter. Electronically Signed   By: Nelson Chimes M.D.   On: 08/03/2022 14:17        Scheduled Meds:  linaclotide  290  mcg Oral QAC breakfast   pantoprazole  40 mg Oral Daily   potassium chloride  40 mEq Oral Q4H   risperiDONE  1 mg Oral BID   rosuvastatin  40 mg Oral Daily   Continuous Infusions:  cefTRIAXone (ROCEPHIN)  IV 2 g (08/04/22 0206)   lactated ringers 50 mL/hr at 08/04/22 0153   magnesium sulfate bolus IVPB 2 g (08/04/22 1111)     LOS: 0 days    Time spent: 35 minutes    Barb Merino, MD [facility] Pager [phone]

## 2022-08-04 NOTE — Plan of Care (Signed)

## 2022-08-04 NOTE — Inpatient Diabetes Management (Signed)
Inpatient Diabetes Program Recommendations  AACE/ADA: New Consensus Statement on Inpatient Glycemic Control (2015)  Target Ranges:  Prepandial:   less than 140 mg/dL      Peak postprandial:   less than 180 mg/dL (1-2 hours)      Critically ill patients:  140 - 180 mg/dL   Lab Results  Component Value Date   GLUCAP 165 (H) 08/03/2022   HGBA1C 5.7 (H) 06/07/2022    Review of Glycemic Control  Latest Reference Range & Units 08/03/22 20:52 08/03/22 21:17 08/03/22 22:24  Glucose-Capillary 70 - 99 mg/dL 45 (L) 70 165 (H)  (L): Data is abnormally low (H): Data is abnormally high Diabetes history: no DM Outpatient Diabetes medications: none Current orders for Inpatient glycemic control: none  Inpatient Diabetes Program Recommendations:     Noted hypoglycemia on arrival due to poor oral intake. May want to consider adding CBGS TID.   Thanks, Bronson Curb, MSN, RNC-OB Diabetes Coordinator [phone] (8a-5p)

## 2022-08-04 NOTE — ED Notes (Addendum)
ED TO INPATIENT HANDOFF REPORT  ED Nurse Name and Phone #: [phone]  S Name/Age/Gender Kaitlyn Duncan 52 y.o. female Room/Bed: 025C/025C  Code Status   Code Status: Full Code  Home/SNF/Other From a Hotel Patient oriented to: self, place, time, and situation Is this baseline? Yes   Triage Complete: Triage complete  Chief Complaint Falls [phone].[phone]]  Triage Note Pt BIB [facility] from a hotel c/o a fall that happened yesterday where she hit her head on a bedside table. Pt has a possible laceration to the back of her head but there is a lot of dried blood and therefore EMS was unable to tell.      Allergies No Known Allergies  Level of Care/Admitting Diagnosis ED Disposition     ED Disposition  Admit   Condition  --   Comment  Hospital Area: [facility] [[id number]]  Level of Care: Med-Surg [16]  May place patient in observation at [facility] or [facility] if equivalent level of care is available:: Yes  Covid Evaluation: Asymptomatic - no recent exposure (last 10 days) testing not required  Diagnosis: Falls [[id number]]  Admitting Physician: Kayleen Memos [[id number]]  Attending Physician: Kayleen Memos [[id number]]          B Medical/Surgery History Past Medical History:  Diagnosis Date   Allergy    Anemia    Gallstones    GERD (gastroesophageal reflux disease)    Hemorrhoids    melanoma with met dz dx'd 01/2018   brain and adrenal   Seasonal allergies    Stroke [facility])    Past Surgical History:  Procedure Laterality Date   BIOPSY  01/28/2018   Procedure: BIOPSY;  Surgeon: Laurence Spates, MD;  Location: [facility]OSCOPY;  Service: Endoscopy;;   BREAST BIOPSY Left    ESOPHAGOGASTRODUODENOSCOPY N/A 01/28/2018   Procedure: ESOPHAGOGASTRODUODENOSCOPY (EGD);  Surgeon: Laurence Spates, MD;  Location: [facility];  Service: Endoscopy;  Laterality: N/A;   MOUTH SURGERY     OVARY SURGERY     removed "something"     A IV Location/Drains/Wounds Patient  Lines/Drains/Airways Status     Active Line/Drains/Airways     Name Placement date Placement time Site Days   Peripheral IV 08/03/22 20 G Anterior;Right Forearm 08/03/22  1609  Forearm  1   External Urinary Catheter 08/03/22  2019  --  1            Intake/Output Last 24 hours No intake or output data in the 24 hours ending 08/04/22 0026  Labs/Imaging Results for orders placed or performed during the hospital encounter of 08/03/22 (from the past 48 hour(s))  CBC with Differential     Status: Abnormal   Collection Time: 08/03/22  3:57 PM  Result Value Ref Range   WBC 4.6 4.0 - 10.5 K/uL   RBC 5.02 3.87 - 5.11 MIL/uL   Hemoglobin 13.5 12.0 - 15.0 g/dL   HCT 42.0 36.0 - 46.0 %   MCV 83.7 80.0 - 100.0 fL   MCH 26.9 26.0 - 34.0 pg   MCHC 32.1 30.0 - 36.0 g/dL   RDW 13.2 11.5 - 15.5 %   Platelets 44 (L) 150 - 400 K/uL    Comment: SPECIMEN CHECKED FOR CLOTS Immature Platelet Fraction may be clinically indicated, consider ordering this additional test [id number] REPEATED TO VERIFY    nRBC 0.0 0.0 - 0.2 %   Neutrophils Relative % 71 %   Neutro Abs 3.3 1.7 - 7.7 K/uL   Lymphocytes  Relative 20 %   Lymphs Abs 0.9 0.7 - 4.0 K/uL   Monocytes Relative 9 %   Monocytes Absolute 0.4 0.1 - 1.0 K/uL   Eosinophils Relative 0 %   Eosinophils Absolute 0.0 0.0 - 0.5 K/uL   Basophils Relative 0 %   Basophils Absolute 0.0 0.0 - 0.1 K/uL   Immature Granulocytes 0 %   Abs Immature Granulocytes 0.01 0.00 - 0.07 K/uL    Comment: Performed at [facility], [place] [address] [place], [place] [address]  Comprehensive metabolic panel     Status: Abnormal   Collection Time: 08/03/22  3:57 PM  Result Value Ref Range   Sodium 138 135 - 145 mmol/L   Potassium 3.3 (L) 3.5 - 5.1 mmol/L   Chloride 100 98 - 111 mmol/L   CO2 19 (L) 22 - 32 mmol/L   Glucose, Bld 49 (L) 70 - 99 mg/dL    Comment: Glucose reference range applies only to samples taken after fasting for at least 8 hours.   BUN 23 (H) 6  - 20 mg/dL   Creatinine, Ser 0.86 0.44 - 1.00 mg/dL   Calcium 9.6 8.9 - 10.3 mg/dL   Total Protein 7.5 6.5 - 8.1 g/dL   Albumin 4.4 3.5 - 5.0 g/dL   AST 50 (H) 15 - 41 U/L   ALT 27 0 - 44 U/L   Alkaline Phosphatase 91 38 - 126 U/L   Total Bilirubin 1.7 (H) 0.3 - 1.2 mg/dL   GFR, Estimated >60 >60 mL/min    Comment: (NOTE) Calculated using the CKD-EPI Creatinine Equation (2021)    Anion gap 19 (H) 5 - 15    Comment: Performed at [facility], [place] [address], [place], [place] [address]  CK     Status: Abnormal   Collection Time: 08/03/22  3:57 PM  Result Value Ref Range   Total CK 416 (H) 38 - 234 U/L    Comment: Performed at [facility], [place] [address], [place], [place] [address]  I-stat chem 8, ED (not at [facility], [facility] or [facility])     Status: Abnormal   Collection Time: 08/03/22  4:13 PM  Result Value Ref Range   Sodium 139 135 - 145 mmol/L   Potassium 3.6 3.5 - 5.1 mmol/L   Chloride 102 98 - 111 mmol/L   BUN 25 (H) 6 - 20 mg/dL   Creatinine, Ser 0.60 0.44 - 1.00 mg/dL   Glucose, Bld 46 (L) 70 - 99 mg/dL    Comment: Glucose reference range applies only to samples taken after fasting for at least 8 hours.   Calcium, Ion 1.10 (L) 1.15 - 1.40 mmol/L   TCO2 21 (L) 22 - 32 mmol/L   Hemoglobin 13.9 12.0 - 15.0 g/dL   HCT 41.0 36.0 - 46.0 %  CBG monitoring, ED     Status: Abnormal   Collection Time: 08/03/22  8:52 PM  Result Value Ref Range   Glucose-Capillary 45 (L) 70 - 99 mg/dL    Comment: Glucose reference range applies only to samples taken after fasting for at least 8 hours.  POC CBG, ED     Status: None   Collection Time: 08/03/22  9:17 PM  Result Value Ref Range   Glucose-Capillary 70 70 - 99 mg/dL    Comment: Glucose reference range applies only to samples taken after fasting for at least 8 hours.  CBG monitoring, ED     Status: Abnormal   Collection  Time: 08/03/22 10:24 PM  Result Value Ref Range   Glucose-Capillary 165 (H) 70 - 99 mg/dL    Comment: Glucose  reference range applies only to samples taken after fasting for at least 8 hours.   MR Brain W and Wo Contrast  Result Date: 08/03/2022 CLINICAL DATA:  Fall EXAM: MRI HEAD WITHOUT AND WITH CONTRAST TECHNIQUE: Multiplanar, multiecho pulse sequences of the brain and surrounding structures were obtained without and with intravenous contrast. CONTRAST:  56m GADAVIST GADOBUTROL 1 MMOL/ML IV SOLN COMPARISON:  04/08/2022 FINDINGS: Brain: No acute infarct, mass effect or extra-axial collection. Fewer than 5 scattered microhemorrhages in a nonspecific pattern. There is confluent hyperintense T2-weighted signal within the white matter. Generalized volume loss. Old small vessel infarcts of the right cerebellum and left thalamus. The midline structures are normal. There is no abnormal contrast enhancement. Vascular: Normal flow voids. Skull and upper cervical spine: Right posterior scalp hematoma. Sinuses/Orbits: Atelectatic left maxillary sinus. No mastoid effusion. Normal orbits. Other: None. IMPRESSION: 1. No acute intracranial abnormality. 2. Old small vessel infarcts of the right cerebellum and left thalamus. 3. Confluent hyperintense T2-weighted signal within the white matter, consistent with chronic microvascular ischemia. Electronically Signed   By: KUlyses JarredM.D.   On: 08/03/2022 20:16   CT Head Wo Contrast  Result Date: 08/03/2022 CLINICAL DATA:  Head trauma.  Moderate-severe. EXAM: CT HEAD WITHOUT CONTRAST TECHNIQUE: Contiguous axial images were obtained from the base of the skull through the vertex without intravenous contrast. RADIATION DOSE REDUCTION: This exam was performed according to the departmental dose-optimization program which includes automated exposure control, adjustment of the mA and/or kV according to patient size and/or use of iterative reconstruction technique. COMPARISON:  07/08/2022 FINDINGS: Brain: Newly seen low-density in the inferior cerebellum on the right concerning for an acute  or subacute right cerebellar infarction. Old infarction of the left posterolateral thalamus which was acute in October of last year. Chronic small-vessel ischemic changes elsewhere throughout the cerebral hemispheric white matter. No mass lesion, hemorrhage, hydrocephalus or extra-axial fluid collection. Vascular: No abnormal vascular finding. Skull: Normal Sinuses/Orbits: Clear/normal Other: None IMPRESSION: 1. Newly seen low-density in the inferior cerebellum on the right concerning for an acute or subacute infarction. Consider MRI for further evaluation. 2. Old infarction of the left posterolateral thalamus which was acute in October of last year. Chronic small-vessel ischemic changes elsewhere throughout the cerebral hemispheric white matter. Electronically Signed   By: MNelson ChimesM.D.   On: 08/03/2022 14:17    Pending Labs Unresulted Labs (From admission, onward)     Start     Ordered   08/05/22 0500  HIV Antibody (routine testing w rflx)  (HIV Antibody (Routine testing w reflex) panel)  Tomorrow morning,   R        08/04/22 0007   08/04/22 0500  CBC  Tomorrow morning,   R        08/04/22 0006   08/04/22 0500  Comprehensive metabolic panel  Tomorrow morning,   R        08/04/22 0006   08/04/22 0500  Magnesium  Tomorrow morning,   R        08/04/22 0006   08/03/22 2249  Urinalysis, Routine w reflex microscopic -Urine, Clean Catch  Once,   URGENT       Question:  Specimen Source  Answer:  Urine, Clean Catch   08/03/22 2248            Vitals/Pain Today's Vitals   08/03/22  2200 08/03/22 2215 08/03/22 2308 08/04/22 0000  BP: 112/75 116/77  99/71  Pulse: 82 78  60  Resp: 18 18    Temp:   97.9 F (36.6 C)   TempSrc:      SpO2: 98% 97%  99%  PainSc:        Isolation Precautions No active isolations  Medications Medications  acetaminophen (TYLENOL) tablet 650 mg (has no administration in time range)  oxyCODONE (Oxy IR/ROXICODONE) immediate release tablet 5 mg (has no  administration in time range)  polyethylene glycol (MIRALAX / GLYCOLAX) packet 17 g (has no administration in time range)  prochlorperazine (COMPAZINE) injection 5 mg (has no administration in time range)  lactated ringers infusion (has no administration in time range)  linaclotide (LINZESS) capsule 290 mcg (has no administration in time range)  pantoprazole (PROTONIX) EC tablet 40 mg (has no administration in time range)  risperiDONE (RISPERDAL) tablet 1 mg (has no administration in time range)  rosuvastatin (CRESTOR) tablet 40 mg (has no administration in time range)  gadobutrol (GADAVIST) 1 MMOL/ML injection 6 mL (6 mLs Intravenous Contrast Given 08/03/22 1931)  sodium chloride 0.9 % bolus 1,000 mL (1,000 mLs Intravenous New Bag/Given 08/03/22 2303)    Mobility Walks usually but weak today, fell     Focused Assessments Evaluated for Fall   R Recommendations: See Admitting Provider Note  Report given to:   Additional Notes: Call me if you have a question, the patient will be sent to the floor at 0100.

## 2022-08-04 NOTE — Evaluation (Signed)
Physical Therapy Evaluation Patient Details Name: Kaitlyn Duncan MRN: [id number] DOB: 1970-07-30 Today's Date: 08/04/2022  History of Present Illness  Kaitlyn Duncan is a 52 y.o. female with medical history significant for melanoma with brain metastasis status post chemotherapy and whole brain radiation, anemia of chronic disease, schizophrenia, chronic thrombocytopenia, GERD, prior stroke admitted after a fall sustaining injury to back of her head requiring staple closure with head CT negative for acute intracranial findings.  Noted to have multiple recent falls with ED visits.  Clinical Impression  Patient presents with mobility limited due to decreased balance, decreased strength, decreased safety awareness, impaired tone and multiple recent falls with continued high fall risk.  She has been staying in a hotel on ground level, and reports walking without devices.  She has some slowed motor responses, increased tone in her trunk and poor postural awareness.  If medication related and improved once treated could return home, but currently unsafe to return home alone PT recommending STSNF at d/c.       Recommendations for follow up therapy are one component of a multi-disciplinary discharge planning process, led by the attending physician.  Recommendations may be updated based on patient status, additional functional criteria and insurance authorization.  Follow Up Recommendations Skilled nursing-short term rehab (<3 hours/day) Can patient physically be transported by private vehicle: No    Assistance Recommended at Discharge Frequent or constant Supervision/Assistance  Patient can return home with the following  A lot of help with walking and/or transfers;Assist for transportation;A lot of help with bathing/dressing/bathroom    Equipment Recommendations Other (comment) (TBA)  Recommendations for Other Services       Functional Status Assessment Patient has had a recent decline  in their functional status and demonstrates the ability to make significant improvements in function in a reasonable and predictable amount of time.     Precautions / Restrictions Precautions Precautions: Fall      Mobility  Bed Mobility               General bed mobility comments: up on BSC upon entry    Transfers Overall transfer level: Needs assistance   Transfers: Sit to/from Stand Sit to Stand: Mod assist, +2 physical assistance           General transfer comment: assist for anterior weight shift, lifting help, assist for balance once up on feet; and for lateral weight shift to initiate stepping    Ambulation/Gait Ambulation/Gait assistance: Mod assist, +2 physical assistance Gait Distance (Feet): 12 Feet (x 2) Assistive device: 2 person hand held assist Gait Pattern/deviations: Step-to pattern, Step-through pattern, Decreased step length - right, Decreased dorsiflexion - right, Decreased dorsiflexion - left, Shuffle, Wide base of support, Decreased stride length       General Gait Details: assist for lateral weight shift, cue for R foot clearance, stride length, assist for upright posture, cue for forward gaze, assist for anterior weight shif to initiate gait  Stairs            Wheelchair Mobility    Modified Rankin (Stroke Patients Only)       Balance Overall balance assessment: Needs assistance Sitting-balance support: Feet supported Sitting balance-Leahy Scale: Poor Sitting balance - Comments: posterior tilt with leaning back on support on BSC and on chair, cue to lean forward on toilet in bathroom to prevent anterior spillage of urine. Postural control: Posterior lean Standing balance support: Bilateral upper extremity supported Standing balance-Leahy Scale: Poor Standing balance comment: mod A on  1 for initial balance/anterior weight shift while OT assisting for toilet hygiene                             Pertinent Vitals/Pain  Pain Assessment Pain Assessment: 0-10 Pain Score: 8  Pain Location: head Pain Descriptors / Indicators: Aching, Discomfort Pain Intervention(s): Monitored during session, Limited activity within patient's tolerance    Home Living Family/patient expects to be discharged to:: Other (Comment) (hotel)                   Additional Comments: reports on ground floor of hotel    Prior Function Prior Level of Function : History of Falls (last six months);Independent/Modified Independent             Mobility Comments: not using assistive devices per her report       Hand Dominance   Dominant Hand: Right    Extremity/Trunk Assessment   Upper Extremity Assessment Upper Extremity Assessment: Defer to OT evaluation    Lower Extremity Assessment Lower Extremity Assessment: LLE deficits/detail;RLE deficits/detail RLE Deficits / Details: AROM WFL in hips/knees, strength at least 3/5, noted decreased ankle DF AROM R>L LLE Deficits / Details: AROM WFL in hips/knees, strength at least 3/5, noted decreased ankle DF AROM R>L    Cervical / Trunk Assessment Cervical / Trunk Assessment: Other exceptions Cervical / Trunk Exceptions: keeping neck flexed forward and tilted L throughout; posterior pelvic tilt, seems slightly rigid with increased muscle tone  Communication   Communication: Other (comment);Expressive difficulties (dysarthria, low volume.)  Cognition Arousal/Alertness: Awake/alert Behavior During Therapy: Flat affect Overall Cognitive Status: Impaired/Different from baseline Area of Impairment: Problem solving, Following commands, Attention, Safety/judgement                   Current Attention Level: Sustained   Following Commands: Follows one step commands consistently, Follows one step commands with increased time Safety/Judgement: Decreased awareness of safety, Decreased awareness of deficits   Problem Solving: Slow processing, Decreased initiation,  Difficulty sequencing, Requires verbal cues          General Comments General comments (skin integrity, edema, etc.): on BSC initially and reports urine leaking onto floor, assist for hygiene and sock/gown change, then pt needing to go more so assisted to toilet in bathroom, once in recliner reports needing to go again so placed pure-wick    Exercises     Assessment/Plan    PT Assessment Patient needs continued PT services  PT Problem List Decreased strength;Decreased balance;Decreased coordination;Decreased safety awareness;Decreased mobility;Impaired tone       PT Treatment Interventions DME instruction;Functional mobility training;Balance training;Patient/family education;Gait training;Therapeutic exercise;Neuromuscular re-education    PT Goals (Current goals can be found in the Care Plan section)  Acute Rehab PT Goals Patient Stated Goal: improve walking PT Goal Formulation: With patient Time For Goal Achievement: 08/13/22 Potential to Achieve Goals: Good    Frequency Min 3X/week     Co-evaluation PT/OT/SLP Co-Evaluation/Treatment: Yes Reason for Co-Treatment: For patient/therapist safety;To address functional/ADL transfers PT goals addressed during session: Mobility/safety with mobility;Balance;Strengthening/ROM         AM-PAC PT "6 Clicks" Mobility  Outcome Measure Help needed turning from your back to your side while in a flat bed without using bedrails?: A Lot Help needed moving from lying on your back to sitting on the side of a flat bed without using bedrails?: A Lot Help needed moving to and from a bed to a  chair (including a wheelchair)?: Total Help needed standing up from a chair using your arms (e.g., wheelchair or bedside chair)?: Total Help needed to walk in hospital room?: Total Help needed climbing 3-5 steps with a railing? : Total 6 Click Score: 8    End of Session Equipment Utilized During Treatment: Gait belt Activity Tolerance: Patient  tolerated treatment well Patient left: in chair;with call bell/phone within reach;with nursing/sitter in room   PT Visit Diagnosis: Other abnormalities of gait and mobility (R26.89);Repeated falls (R29.6);Other symptoms and signs involving the nervous system (R29.898)    Time: 0177-9390 PT Time Calculation (min) (ACUTE ONLY): 34 min   Charges:   PT Evaluation $PT Eval Moderate Complexity: 1 Mod          Cyndi Junelle Hashemi, PT Acute Rehabilitation Services [phone] 08/04/2022   Reginia Naas 08/04/2022, 11:55 AM

## 2022-08-04 NOTE — Evaluation (Signed)
Occupational Therapy Evaluation Patient Details Name: Kaitlyn Duncan MRN: [id number] DOB: 1970/09/27 Today's Date: 08/04/2022   History of Present Illness Kaitlyn Duncan is a 52 y.o. female with medical history significant for melanoma with brain metastasis status post chemotherapy and whole brain radiation, anemia of chronic disease, schizophrenia, chronic thrombocytopenia, GERD, prior stroke admitted after a fall sustaining injury to back of her head requiring staple closure with head CT negative for acute intracranial findings.  Noted to have multiple recent falls with ED visits.   Clinical Impression   Prior to this admission, patient living in a hotel on the ground floor. Patient endorses independence with ADLs and mobility without AD, however noted bruises all over body (likely from multiple falls). Currently, patient presenting with decreased activity tolerance, slowed processing for all ADLs and functional ambulation, and need for mod A to complete ADLs. Patient mod A of 2 to ambulate to toilet, with significant posterior lean noted throughout and shuffled gait. OT recommending SNF placement when medically appropriate for discharge; OT will continue to follow.       Recommendations for follow up therapy are one component of a multi-disciplinary discharge planning process, led by the attending physician.  Recommendations may be updated based on patient status, additional functional criteria and insurance authorization.   Follow Up Recommendations  Skilled nursing-short term rehab (<3 hours/day)     Assistance Recommended at Discharge Frequent or constant Supervision/Assistance  Patient can return home with the following A lot of help with walking and/or transfers;A lot of help with bathing/dressing/bathroom;Assistance with cooking/housework;Direct supervision/assist for medications management;Direct supervision/assist for financial management;Assist for transportation;Help  with stairs or ramp for entrance    Functional Status Assessment  Patient has had a recent decline in their functional status and demonstrates the ability to make significant improvements in function in a reasonable and predictable amount of time.  Equipment Recommendations  Other (comment) (Defer to next venue)    Recommendations for Other Services       Precautions / Restrictions Precautions Precautions: Fall Restrictions Weight Bearing Restrictions: No      Mobility Bed Mobility               General bed mobility comments: up on BSC upon entry    Transfers Overall transfer level: Needs assistance   Transfers: Sit to/from Stand Sit to Stand: Mod assist, +2 physical assistance           General transfer comment: assist for anterior weight shift, lifting help, assist for balance once up on feet; and for lateral weight shift to initiate stepping      Balance Overall balance assessment: Needs assistance Sitting-balance support: Feet supported Sitting balance-Leahy Scale: Poor Sitting balance - Comments: posterior tilt with leaning back on support on BSC and on chair, cue to lean forward on toilet in bathroom to prevent anterior spillage of urine. Postural control: Posterior lean Standing balance support: Bilateral upper extremity supported Standing balance-Leahy Scale: Poor Standing balance comment: mod A on 1 for initial balance/anterior weight shift while OT assisting for toilet hygiene                           ADL either performed or assessed with clinical judgement   ADL Overall ADL's : Needs assistance/impaired Eating/Feeding: Set up;Sitting   Grooming: Set up;Sitting   Upper Body Bathing: Minimal assistance;Sitting   Lower Body Bathing: Maximal assistance;Sitting/lateral leans;Sit to/from stand   Upper Body Dressing : Minimal assistance;Sitting  Lower Body Dressing: Maximal assistance;Sitting/lateral leans;Sit to/from stand    Toilet Transfer: Moderate assistance;+2 for safety/equipment;+2 for physical assistance;Ambulation Toilet Transfer Details (indicate cue type and reason): mod A of 2 to ambulate to toilet, posterior lean noted throughout, shuffled gait R>L Toileting- Clothing Manipulation and Hygiene: Maximal assistance;Sitting/lateral lean;Sit to/from stand       Functional mobility during ADLs: Moderate assistance;+2 for physical assistance;+2 for safety/equipment;Cueing for sequencing;Cueing for safety General ADL Comments: Patient presenting with decreased activity tolerance, slowed processing for all ADLs and functional ambulation, and need for mod A to complete ADLs     Vision Baseline Vision/History: 0 No visual deficits Ability to See in Adequate Light: 0 Adequate Patient Visual Report: No change from baseline       Perception     Praxis      Pertinent Vitals/Pain Pain Assessment Pain Assessment: 0-10 Pain Score: 8  Pain Location: head Pain Descriptors / Indicators: Aching, Discomfort Pain Intervention(s): Limited activity within patient's tolerance, Monitored during session, Repositioned     Hand Dominance Right   Extremity/Trunk Assessment Upper Extremity Assessment Upper Extremity Assessment: Generalized weakness   Lower Extremity Assessment Lower Extremity Assessment: Defer to PT evaluation   Cervical / Trunk Assessment Cervical / Trunk Assessment: Other exceptions Cervical / Trunk Exceptions: keeping neck flexed forward and tilted L throughout; posterior pelvic tilt, seems slightly rigid with increased muscle tone   Communication Communication Communication: Other (comment);Expressive difficulties (dysarthria, low volume.)   Cognition Arousal/Alertness: Awake/alert Behavior During Therapy: Flat affect Overall Cognitive Status: Impaired/Different from baseline Area of Impairment: Problem solving, Following commands, Attention, Safety/judgement                    Current Attention Level: Sustained   Following Commands: Follows one step commands consistently, Follows one step commands with increased time Safety/Judgement: Decreased awareness of safety, Decreased awareness of deficits   Problem Solving: Slow processing, Decreased initiation, Difficulty sequencing, Requires verbal cues General Comments: Patient with need for increased time to respond to all questions, slowed movements throughout session     General Comments  on BSC initially and reports urine leaking onto floor, assist for hygiene and sock/gown change, then pt needing to go more so assisted to toilet in bathroom, once in recliner reports needing to go again so placed pure-wick    Exercises     Shoulder Instructions      Home Living Family/patient expects to be discharged to:: Other (Comment) (hotel)                                 Additional Comments: reports on ground floor of hotel      Prior Functioning/Environment Prior Level of Function : History of Falls (last six months);Independent/Modified Independent             Mobility Comments: not using assistive devices per her report ADLs Comments: not driving, reports independence, however noted multiple bruises and decreased abilities in evaluation        OT Problem List: Decreased range of motion;Decreased strength;Decreased activity tolerance;Impaired balance (sitting and/or standing);Decreased cognition;Decreased coordination;Decreased safety awareness;Decreased knowledge of precautions;Decreased knowledge of use of DME or AE;Pain      OT Treatment/Interventions: Self-care/ADL training;Therapeutic exercise;Energy conservation;DME and/or AE instruction;Manual therapy;Therapeutic activities;Balance training;Patient/family education    OT Goals(Current goals can be found in the care plan section) Acute Rehab OT Goals Patient Stated Goal: to go to the bathroom OT Goal Formulation:  Patient unable to  participate in goal setting Time For Goal Achievement: 08/18/22 Potential to Achieve Goals: Fair  OT Frequency: Min 2X/week    Co-evaluation              AM-PAC OT "6 Clicks" Daily Activity     Outcome Measure Help from another person eating meals?: A Little Help from another person taking care of personal grooming?: A Little Help from another person toileting, which includes using toliet, bedpan, or urinal?: A Lot Help from another person bathing (including washing, rinsing, drying)?: A Lot Help from another person to put on and taking off regular upper body clothing?: A Little Help from another person to put on and taking off regular lower body clothing?: A Lot 6 Click Score: 15   End of Session Equipment Utilized During Treatment: Gait belt Nurse Communication: Mobility status  Activity Tolerance: Patient limited by fatigue;Patient limited by lethargy Patient left: in chair;with call bell/phone within reach;with chair alarm set  OT Visit Diagnosis: Unsteadiness on feet (R26.81);Other abnormalities of gait and mobility (R26.89);Repeated falls (R29.6);Muscle weakness (generalized) (M62.81);History of falling (Z91.81);Other symptoms and signs involving cognitive function;Pain Pain - part of body:  (headache)                Time: 0940-7680 OT Time Calculation (min): 34 min Charges:  OT General Charges $OT Visit: 1 Visit OT Evaluation $OT Eval Moderate Complexity: 1 Mod  Corinne Ports E. Brenyn Petrey, OTR/L Acute Rehabilitation Services [phone]   Ascencion Dike 08/04/2022, 3:48 PM

## 2022-08-04 NOTE — TOC Initial Note (Addendum)
Transition of Care [facility]) - Initial/Assessment Note    Patient Details  Name: Kaitlyn Duncan MRN: [id number] Date of Birth: [birth year]  Transition of Care [facility]) CM/SW Contact:    Curlene Labrum, RN Phone Number: 08/04/2022, 3:36 PM  Clinical Narrative:                 CM met with the patient at the bedside to discuss transitions of care needs.  The patient states that she has been staying at a hotel and plans to return when she is discharged home from the hospital.  The patient has a legal guardian, Rosiland Oz, [place] guardian that was notified of her admission to the hospital - [phone].  The patient was able to answer my questions and states that she does not want to go to a nursing home for rehab but would like to return to the hotel with home health services.  The patient ambulates without DME at the hotel and states that she was active with [facility] home health recently.  [facility] Team provides transportation for the patient to appointments and errands in the community.  CM will continue to follow the patient for [facility] needs - patient declines SNf placement.  I called and left a message with Alvis Lemmings to obtain [facility] services once patient is medically stable to discharge - later date.  Patient is currently inpatient for unsafe discharge, dehydration, UTI and need for IV antibiotics.  [place] orders placed for [facility] PT/OT, MSW and Alvis Lemmings agreed to provide services upon discharge back to the hotel  Expected Discharge Plan: [place] Barriers to Discharge: Continued Medical Work up   Patient Goals and CMS Choice Patient states their goals for this hospitalization and ongoing recovery are:: Patient wants to get better and return to the [employer] [place] Medicare.gov Compare Post Acute Care list provided to:: Patient Choice offered to / list presented to : Patient [place] ownership interest in [facility].provided to:: Patient    Expected  Discharge Plan and Services   Discharge Planning Services: CM Consult   Living arrangements for the past 2 months: Hotel/Motel                                      Prior Living Arrangements/Services Living arrangements for the past 2 months: Hotel/Motel Lives with:: Self Patient language and need for interpreter reviewed:: Yes Do you feel safe going back to the place where you live?: Yes      Need for Family Participation in Patient Care: Yes (Comment) Care giver support system in place?: Yes (comment) (Legal guardian through [place])   Criminal Activity/Legal Involvement Pertinent to Current Situation/Hospitalization: No - Comment as needed  Activities of Daily Living Home Assistive Devices/Equipment: None (Patient says she has a cane coming soon)    Permission Sought/Granted Permission sought to share information with : Case Manager, Guardian Permission granted to share information with : Yes, Verbal Permission Granted     Permission granted to share info w AGENCY: Home health agency        Emotional Assessment Appearance:: Appears stated age Attitude/Demeanor/Rapport: Engaged Affect (typically observed): Accepting Orientation: : Oriented to Self, Oriented to Place, Oriented to  Time, Oriented to Situation Alcohol / Substance Use: Not Applicable Psych Involvement: No (comment)  Admission diagnosis:  Weakness [R53.1] Falls [W19.XXXA] Injury of head, initial encounter [Z48.27MB] Fall, initial encounter [  W19.XXXA] UTI (urinary tract infection) [N39.0] Patient Active Problem List   Diagnosis Date Noted   UTI (urinary tract infection) 08/04/2022   Falls 08/03/2022   Constipation 07/23/2022   Involuntary commitment 06/07/2022   Frequent falls 04/09/2022   Allergic rhinitis 04/09/2022   Hx of completed stroke 04/08/2022   Prediabetes 12/24/2021   Frequent urination 12/24/2021   Undifferentiated schizophrenia ([place]) 09/25/2021   Leukopenia 06/27/2021    Thrombocytopenia ([place]) 06/27/2021   Schizophrenia spectrum disorder with psychotic disorder type not yet determined ([place]) 06/26/2021   Has difficulty accessing primary care provider for most visits 06/15/2021   Candidiasis of skin 01/23/2021   Musculoskeletal pain 01/23/2021   Psychosis ([place])    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   Melanoma of skin ([place]) 05/29/2018   Goals of care, counseling/discussion 05/29/2018   Malignant neoplasm metastatic to brain ([place]) 01/26/2018   PCP:  Hoyt Koch, MD Pharmacy:   [place], [place] [address] Phone: [phone] Fax: [phone]     Social Determinants of Health (SDOH) Social History: [place]: Food Insecurity Present (08/04/2022)  Housing: High Risk (04/09/2022)  Transportation Needs: Unmet Transportation Needs (08/04/2022)  Utilities: Not At Risk (08/04/2022)  Alcohol Screen: Low Risk  (06/27/2021)  Depression (PHQ2-9): Low Risk  (07/21/2022)  Tobacco Use: Low Risk  (08/03/2022)   SDOH Interventions:     Readmission Risk Interventions    08/04/2022    3:35 PM  Readmission Risk Prevention Plan  Transportation Screening Complete  PCP or Specialist Appt within 3-5 Days Complete  HRI or [place] Complete  Social Work Consult for [place] Planning/Counseling Complete  Palliative Care Screening Complete  Medication Review Press photographer) Complete

## 2022-08-05 DIAGNOSIS — R262 Difficulty in walking, not elsewhere classified: Secondary | ICD-10-CM | POA: Diagnosis not present

## 2022-08-05 DIAGNOSIS — E876 Hypokalemia: Secondary | ICD-10-CM | POA: Diagnosis not present

## 2022-08-05 LAB — POTASSIUM: Potassium: 2.9 mmol/L — ABNORMAL LOW (ref 3.5–5.1)

## 2022-08-05 LAB — MAGNESIUM: Magnesium: 1.9 mg/dL (ref 1.7–2.4)

## 2022-08-05 LAB — HIV ANTIBODY (ROUTINE TESTING W REFLEX): HIV Screen 4th Generation wRfx: NONREACTIVE

## 2022-08-05 LAB — PHOSPHORUS: Phosphorus: 1.7 mg/dL — ABNORMAL LOW (ref 2.5–4.6)

## 2022-08-05 MED ORDER — FAMOTIDINE 20 MG PO TABS
20.0000 mg | ORAL_TABLET | Freq: Every day | ORAL | Status: DC
  Administered 2022-08-05 – 2022-08-12 (×8): 20 mg via ORAL
  Filled 2022-08-05 (×8): qty 1

## 2022-08-05 MED ORDER — POTASSIUM CHLORIDE CRYS ER 20 MEQ PO TBCR
40.0000 meq | EXTENDED_RELEASE_TABLET | Freq: Three times a day (TID) | ORAL | Status: DC
  Administered 2022-08-05 – 2022-08-06 (×3): 40 meq via ORAL
  Filled 2022-08-05 (×4): qty 2

## 2022-08-05 MED ORDER — POTASSIUM PHOSPHATES 15 MMOLE/5ML IV SOLN
20.0000 mmol | Freq: Once | INTRAVENOUS | Status: AC
  Administered 2022-08-05: 20 mmol via INTRAVENOUS
  Filled 2022-08-05: qty 6.67

## 2022-08-05 NOTE — Progress Notes (Signed)
Physical Therapy Treatment Patient Details Name: Kaitlyn Duncan MRN: [id number] DOB: 1971-01-03 Today's Date: 08/05/2022   History of Present Illness 52 y.o. female admitted 1/30 with fall and head lac. PMhx: melanoma with brain mets s/p chemo and whole brain radiation, anemia of chronic disease, schizophrenia, chronic thrombocytopenia, GERD, CVA, multiple recent falls with ED visits.    PT Comments    Pt with very flat affect, limited initiation and response to cues with max assist to achieve sitting EOB. Pt able to pivot to chair with +2 assist but could not initiate stepping this session. Pt with no awareness of deficits and continues to state that if she were home she would be able to get up and walk despite inability to do so even with assist today. Continue to recommend SNF and reiterated this to pt. Will continue to follow.     Recommendations for follow up therapy are one component of a multi-disciplinary discharge planning process, led by the attending physician.  Recommendations may be updated based on patient status, additional functional criteria and insurance authorization.  Follow Up Recommendations  Skilled nursing-short term rehab (<3 hours/day) Can patient physically be transported by private vehicle: No   Assistance Recommended at Discharge Frequent or constant Supervision/Assistance  Patient can return home with the following A lot of help with walking and/or transfers;Assist for transportation;A lot of help with bathing/dressing/bathroom   Equipment Recommendations  Rolling walker (2 wheels);Wheelchair (measurements PT);BSC/3in1    Recommendations for Other Services       Precautions / Restrictions Precautions Precautions: Fall     Mobility  Bed Mobility Overal bed mobility: Needs Assistance Bed Mobility: Supine to Sit     Supine to sit: Max assist, HOB elevated     General bed mobility comments: HOb 25 degrees with max assist and pad to pivot to  EOB    Transfers Overall transfer level: Needs assistance   Transfers: Sit to/from Stand, Bed to chair/wheelchair/BSC Sit to Stand: Mod assist, +2 physical assistance Stand pivot transfers: Mod assist, +2 physical assistance         General transfer comment: mod +2 pivot with bil UE support to rise from bed and from chair. bil UE support on therapist and tech to pivot to chair. On 2nd standing RW present and pt able to stand grossly 1 min but could not advance feet to step    Ambulation/Gait                   Stairs             Wheelchair Mobility    Modified Rankin (Stroke Patients Only)       Balance Overall balance assessment: Needs assistance   Sitting balance-Leahy Scale: Poor Sitting balance - Comments: posterior right lean in sitting with increased time to correct to midline   Standing balance support: Bilateral upper extremity supported, Reliant on assistive device for balance Standing balance-Leahy Scale: Poor Standing balance comment: bil UE support on therapist or RW in standing                            Cognition Arousal/Alertness: Awake/alert Behavior During Therapy: Flat affect Overall Cognitive Status: Impaired/Different from baseline Area of Impairment: Problem solving, Following commands, Attention, Safety/judgement, Orientation                 Orientation Level: Disoriented to, Time, Situation Current Attention Level: Sustained   Following Commands: Follows one  step commands inconsistently, Follows one step commands with increased time Safety/Judgement: Decreased awareness of safety, Decreased awareness of deficits   Problem Solving: Slow processing, Decreased initiation, Requires verbal cues, Requires tactile cues, Difficulty sequencing General Comments: pt with extremely slow movements, no apparent initiation for bed mobility despite max cues        Exercises      General Comments        Pertinent  Vitals/Pain Pain Assessment Pain Assessment: No/denies pain    Home Living                          Prior Function            PT Goals (current goals can now be found in the care plan section) Progress towards PT goals: Not progressing toward goals - comment    Frequency    Min 3X/week      PT Plan Current plan remains appropriate    Co-evaluation              AM-PAC PT "6 Clicks" Mobility   Outcome Measure  Help needed turning from your back to your side while in a flat bed without using bedrails?: A Lot Help needed moving from lying on your back to sitting on the side of a flat bed without using bedrails?: A Lot Help needed moving to and from a bed to a chair (including a wheelchair)?: Total Help needed standing up from a chair using your arms (e.g., wheelchair or bedside chair)?: Total Help needed to walk in hospital room?: Total Help needed climbing 3-5 steps with a railing? : Total 6 Click Score: 8    End of Session Equipment Utilized During Treatment: Gait belt Activity Tolerance: Patient tolerated treatment well Patient left: in chair;with call bell/phone within reach;with chair alarm set Nurse Communication: Mobility status PT Visit Diagnosis: Other abnormalities of gait and mobility (R26.89);Repeated falls (R29.6);Other symptoms and signs involving the nervous system (Z02.585)     Time: 2778-2423 PT Time Calculation (min) (ACUTE ONLY): 26 min  Charges:  $Therapeutic Activity: 23-37 mins                     Bayard Males, PT Acute Rehabilitation Services Office: [place] 08/05/2022, 9:36 AM

## 2022-08-05 NOTE — Plan of Care (Signed)

## 2022-08-05 NOTE — Progress Notes (Signed)
PROGRESS NOTE    Kaitlyn Duncan  ZOX:[id number] DOB: 1971/02/09 DOA: 08/03/2022 PCP: Hoyt Koch, MD    Brief Narrative:  52 year old female with history of melanoma with brain mets status post chemotherapy and whole brain radiation, anemia of chronic disease, chronic thrombocytopenia, GERD and history of stroke presented from hotel room where she fell while she was trying to reach her laundry basket.  Reported mechanical fall.  Denied any dizziness lightheadedness.  In the emergency room skin survey was negative.  She had a scalp repaired with 2 staples.  CT head and MRI with old stroke no acute findings.  Admitted due to recurrent fall and unsafe discharge.  Urinalysis was abnormal.  Blood sugars were 49.   Assessment & Plan:   Physical deconditioning and debility, recurrent falls: No evidence of skeletal injury.  Has a history of stroke.  No acute neurological deficits but globally weak.  Work with PT OT to look for safe disposition. Scalp laceration.  Repaired.  Will need to remove the staples in 1 week from 1/30. Unsafe to mobilize.  Will need a SNF.  Presumptive UTI: Urinalysis was abnormal.  Unfortunately urine culture was not sent.  Will treat with Rocephin for 3 days.  Hyperglycemia secondary to inadequate oral intake, moderate clinical dehydration, high anion gap metabolic acidosis: Blood sugars improved after regular intake.  Patient probably does not have adequate access to meal.  Social worker to evaluate. Encourage oral intake.  Discontinue IV fluids.  Hypokalemia: Persistently low.  Replaced today again.  Hypomagnesemia: Replaced.  Hypophosphatemia: Replace further today.  Schizophrenia: Take risperidone.  Continued.  Hyperlipidemia: Continue statin.   DVT prophylaxis: SCDs Start: 08/03/22 2341   Code Status: Full code Family Communication: None at bedside Disposition Plan: Status is: Inpatient.  Not safe to discharge to hotel.   Consultants:   None  Procedures:  None  Antimicrobials:  Rocephin 1/30---   Subjective: Seen and examined.  Flat affect.  Not very interactive.  Physical therapy noted that patient was not able to get out of the bed by herself.  Could not walk safely.  Appetite is improving.  Objective: Vitals:   08/04/22 1649 08/04/22 2017 08/05/22 0517 08/05/22 0720  BP: 107/76 (!) 119/95 108/74 119/78  Pulse: 100 100 88 98  Resp: '15 18 16   '$ Temp: 98.9 F (37.2 C) 98.6 F (37 C) 98.4 F (36.9 C) 98.1 F (36.7 C)  TempSrc:   Oral Oral  SpO2: 97% 96% 98% 99%    Intake/Output Summary (Last 24 hours) at 08/05/2022 1110 Last data filed at 08/05/2022 0600 Gross per 24 hour  Intake 420 ml  Output 200 ml  Net 220 ml   There were no vitals filed for this visit.  Examination:  General exam: Appears calm and comfortable .  Debilitated.  Alert awake and oriented.  Flat affect.  On room air.  Not in any distress. Respiratory system: Clear to auscultation. Respiratory effort normal. Cardiovascular system: S1 & S2 heard, RRR. No pedal edema. Gastrointestinal system: Soft.  Nontender.  Bowel sound present. Central nervous system: Alert and oriented. No focal neurological deficits.  Globally weak. 2 staples intact, nontender clean and dry on the posterior scalp.    Data Reviewed: I have personally reviewed following labs and imaging studies  CBC: Recent Labs  Lab 08/03/22 1557 08/03/22 1613 08/04/22 0826  WBC 4.6  --  5.5  NEUTROABS 3.3  --   --   HGB 13.5 13.9 12.3  HCT 42.0  41.0 35.5*  MCV 83.7  --  80.5  PLT 44*  --  38*   Basic Metabolic Panel: Recent Labs  Lab 08/03/22 1557 08/03/22 1613 08/04/22 0826 08/05/22 0430  NA 138 139 135  --   K 3.3* 3.6 2.8* 2.9*  CL 100 102 102  --   CO2 19*  --  18*  --   GLUCOSE 49* 46* 75  --   BUN 23* 25* 11  --   CREATININE 0.86 0.60 0.89  --   CALCIUM 9.6  --  8.5*  --   MG  --   --  1.6* 1.9  PHOS  --   --   --  1.7*   GFR: CrCl cannot be  calculated (Unknown ideal weight.). Liver Function Tests: Recent Labs  Lab 08/03/22 1557 08/04/22 0826  AST 50* 38  ALT 27 23  ALKPHOS 91 75  BILITOT 1.7* 1.2  PROT 7.5 6.4*  ALBUMIN 4.4 3.6   No results for input(s): "LIPASE", "AMYLASE" in the last 168 hours. No results for input(s): "AMMONIA" in the last 168 hours. Coagulation Profile: No results for input(s): "INR", "PROTIME" in the last 168 hours. Cardiac Enzymes: Recent Labs  Lab 08/03/22 1557  CKTOTAL 416*   BNP (last 3 results) No results for input(s): "PROBNP" in the last 8760 hours. HbA1C: No results for input(s): "HGBA1C" in the last 72 hours. CBG: Recent Labs  Lab 08/03/22 2052 08/03/22 2117 08/03/22 2224  GLUCAP 45* 70 165*   Lipid Profile: No results for input(s): "CHOL", "HDL", "LDLCALC", "TRIG", "CHOLHDL", "LDLDIRECT" in the last 72 hours. Thyroid Function Tests: No results for input(s): "TSH", "T4TOTAL", "FREET4", "T3FREE", "THYROIDAB" in the last 72 hours. Anemia Panel: No results for input(s): "VITAMINB12", "FOLATE", "FERRITIN", "TIBC", "IRON", "RETICCTPCT" in the last 72 hours. Sepsis Labs: No results for input(s): "PROCALCITON", "LATICACIDVEN" in the last 168 hours.  No results found for this or any previous visit (from the past 240 hour(s)).       Radiology Studies: MR Brain W and Wo Contrast  Result Date: 08/03/2022 CLINICAL DATA:  Fall EXAM: MRI HEAD WITHOUT AND WITH CONTRAST TECHNIQUE: Multiplanar, multiecho pulse sequences of the brain and surrounding structures were obtained without and with intravenous contrast. CONTRAST:  71m GADAVIST GADOBUTROL 1 MMOL/ML IV SOLN COMPARISON:  04/08/2022 FINDINGS: Brain: No acute infarct, mass effect or extra-axial collection. Fewer than 5 scattered microhemorrhages in a nonspecific pattern. There is confluent hyperintense T2-weighted signal within the white matter. Generalized volume loss. Old small vessel infarcts of the right cerebellum and left  thalamus. The midline structures are normal. There is no abnormal contrast enhancement. Vascular: Normal flow voids. Skull and upper cervical spine: Right posterior scalp hematoma. Sinuses/Orbits: Atelectatic left maxillary sinus. No mastoid effusion. Normal orbits. Other: None. IMPRESSION: 1. No acute intracranial abnormality. 2. Old small vessel infarcts of the right cerebellum and left thalamus. 3. Confluent hyperintense T2-weighted signal within the white matter, consistent with chronic microvascular ischemia. Electronically Signed   By: KUlyses JarredM.D.   On: 08/03/2022 20:16   CT Head Wo Contrast  Result Date: 08/03/2022 CLINICAL DATA:  Head trauma.  Moderate-severe. EXAM: CT HEAD WITHOUT CONTRAST TECHNIQUE: Contiguous axial images were obtained from the base of the skull through the vertex without intravenous contrast. RADIATION DOSE REDUCTION: This exam was performed according to the departmental dose-optimization program which includes automated exposure control, adjustment of the mA and/or kV according to patient size and/or use of iterative reconstruction technique. COMPARISON:  07/08/2022  FINDINGS: Brain: Newly seen low-density in the inferior cerebellum on the right concerning for an acute or subacute right cerebellar infarction. Old infarction of the left posterolateral thalamus which was acute in October of last year. Chronic small-vessel ischemic changes elsewhere throughout the cerebral hemispheric white matter. No mass lesion, hemorrhage, hydrocephalus or extra-axial fluid collection. Vascular: No abnormal vascular finding. Skull: Normal Sinuses/Orbits: Clear/normal Other: None IMPRESSION: 1. Newly seen low-density in the inferior cerebellum on the right concerning for an acute or subacute infarction. Consider MRI for further evaluation. 2. Old infarction of the left posterolateral thalamus which was acute in October of last year. Chronic small-vessel ischemic changes elsewhere throughout the  cerebral hemispheric white matter. Electronically Signed   By: Nelson Chimes M.D.   On: 08/03/2022 14:17        Scheduled Meds:  famotidine  20 mg Oral Daily   linaclotide  290 mcg Oral QAC breakfast   potassium chloride  40 mEq Oral TID   risperiDONE  1 mg Oral BID   rosuvastatin  40 mg Oral Daily   Continuous Infusions:  cefTRIAXone (ROCEPHIN)  IV 2 g (08/04/22 2234)   lactated ringers 50 mL/hr at 08/05/22 1015   potassium PHOSPHATE IVPB (in mmol)       LOS: 1 day    Time spent: 35 minutes    Barb Merino, MD [facility] Pager [phone]

## 2022-08-06 DIAGNOSIS — E876 Hypokalemia: Secondary | ICD-10-CM | POA: Diagnosis not present

## 2022-08-06 DIAGNOSIS — R262 Difficulty in walking, not elsewhere classified: Secondary | ICD-10-CM | POA: Diagnosis not present

## 2022-08-06 NOTE — TOC Progression Note (Signed)
Transition of Care [facility]) - Progression Note    Patient Details  Name: Kaitlyn Duncan MRN: [id number] Date of Birth: 1971-03-30  [facility]) CM/SW Contact  Jinger Neighbors,  Phone Number: 08/06/2022, 4:20 PM  Clinical Narrative:     PASRR completed; additional documents uploaded, just waiting for [place] number.    Expected Discharge Plan: [place] Barriers to Discharge: Continued Medical Work up  Expected Discharge Plan and Services   Discharge Planning Services: CM Consult   Living arrangements for the past 2 months: Hotel/Motel                                       Social Determinants of Health (SDOH) Interventions SDOH Screenings   Food Insecurity: Food Insecurity Present (08/04/2022)  Housing: High Risk (04/09/2022)  Transportation Needs: Unmet Transportation Needs (08/04/2022)  Utilities: Not At Risk (08/04/2022)  Alcohol Screen: Low Risk  (06/27/2021)  Depression (PHQ2-9): Low Risk  (07/21/2022)  Tobacco Use: Low Risk  (08/03/2022)    Readmission Risk Interventions    08/04/2022    3:35 PM  Readmission Risk Prevention Plan  Transportation Screening Complete  PCP or Specialist Appt within 3-5 Days Complete  HRI or [place] Complete  Social Work Consult for [place] Planning/Counseling Complete  Palliative Care Screening Complete  Medication Review [employer]) Complete

## 2022-08-06 NOTE — NC FL2 (Signed)
[place] LEVEL OF CARE FORM     IDENTIFICATION  Patient Name: Kaitlyn Duncan Birthdate: 02/26/1971 Sex: female Admission Date (Current Location): 08/03/2022  [facility] and [place] Number:  Herbalist and Address:  [facility] [place] [address], [place], [place] [address]      Provider Number: [id number]  Attending Physician Name and Address:  Barb Merino, MD  Relative Name and Phone Number:       Current Level of Care: SNF Recommended Level of Care: [place] Prior Approval Number:    Date Approved/Denied:   PASRR Number: Pending  Discharge Plan: SNF    Current Diagnoses: Patient Active Problem List   Diagnosis Date Noted   UTI (urinary tract infection) 08/04/2022   Falls 08/03/2022   Constipation 07/23/2022   Involuntary commitment 06/07/2022   Frequent falls 04/09/2022   Allergic rhinitis 04/09/2022   Hx of completed stroke 04/08/2022   Prediabetes 12/24/2021   Frequent urination 12/24/2021   Undifferentiated schizophrenia ([place]) 09/25/2021   Leukopenia 06/27/2021   Thrombocytopenia ([place]) 06/27/2021   Schizophrenia spectrum disorder with psychotic disorder type not yet determined ([place]) 06/26/2021   Has difficulty accessing primary care provider for most visits 06/15/2021   Candidiasis of skin 01/23/2021   Musculoskeletal pain 01/23/2021   Psychosis ([place])    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   Melanoma of skin ([place]) 05/29/2018   Goals of care, counseling/discussion 05/29/2018   Malignant neoplasm metastatic to brain ([place]) 01/26/2018    Orientation RESPIRATION BLADDER Height & Weight     Self, Time, Situation, Place  Normal Continent, External catheter Weight:   Height:     BEHAVIORAL SYMPTOMS/MOOD NEUROLOGICAL BOWEL NUTRITION STATUS      Continent Diet  AMBULATORY STATUS COMMUNICATION OF NEEDS Skin   Extensive Assist Verbally Normal, Other  (Comment) (Head laceration)                       Personal Care Assistance Level of Assistance  Bathing, Feeding, Dressing Bathing Assistance: Limited assistance Feeding assistance: Limited assistance       Functional Limitations Info  Sight, Hearing, Speech Sight Info: Adequate Hearing Info: Adequate Speech Info: Adequate    SPECIAL CARE FACTORS FREQUENCY  PT (By licensed PT), OT (By licensed OT)     PT Frequency: 5x weekly OT Frequency: 5x weekly            Contractures Contractures Info: Not present    Additional Factors Info  Code Status, Allergies Code Status Info: Full Code Allergies Info: No known allergies           Current Medications (08/06/2022):  This is the current hospital active medication list Current Facility-Administered Medications  Medication Dose Route Frequency Provider Last Rate Last Admin   acetaminophen (TYLENOL) tablet 650 mg  650 mg Oral Q6H PRN Irene Pap N, DO       cefTRIAXone (ROCEPHIN) 2 g in sodium chloride 0.9 % 100 mL IVPB  2 g Intravenous QHS Hall, Carole N, DO   Stopped at 08/05/22 2211   famotidine (PEPCID) tablet 20 mg  20 mg Oral Daily Barb Merino, MD   20 mg at 08/06/22 0816   linaclotide (LINZESS) capsule 290 mcg  290 mcg Oral QAC breakfast Kayleen Memos, DO   290 mcg at 08/06/22 6546   oxyCODONE (Oxy IR/ROXICODONE) immediate release tablet 5 mg  5 mg Oral Q6H PRN [place] Crane,  Lorenda Cahill, DO       polyethylene glycol (MIRALAX / GLYCOLAX) packet 17 g  17 g Oral Daily PRN Irene Pap N, DO       prochlorperazine (COMPAZINE) injection 5 mg  5 mg Intravenous Q6H PRN Irene Pap N, DO       risperiDONE (RISPERDAL) tablet 1 mg  1 mg Oral BID Irene Pap N, DO   1 mg at 08/06/22 0817   rosuvastatin (CRESTOR) tablet 40 mg  40 mg Oral Daily Kayleen Memos, DO   40 mg at 08/06/22 5784     Discharge Medications: Please see discharge summary for a list of discharge medications.  Relevant Imaging Results:  Relevant Lab  Results:   Additional Information SSN: [id number]  Archie Endo, LCSW

## 2022-08-06 NOTE — Progress Notes (Signed)
PROGRESS NOTE    Kaitlyn Duncan  ZDG:[id number] DOB: [birth year] DOA: 08/03/2022 PCP: Hoyt Koch, MD    Brief Narrative:  52 year old female with history of melanoma with brain mets status post chemotherapy and whole brain radiation, anemia of chronic disease, chronic thrombocytopenia, GERD and history of stroke presented from hotel room where she fell while she was trying to reach her laundry basket.  Reported mechanical fall.  Denied any dizziness lightheadedness.  In the emergency room skin survey was negative.  She had a scalp repaired with 2 staples.  CT head and MRI with old stroke no acute findings.  Admitted due to recurrent fall and unsafe discharge.  Urinalysis was abnormal.  Blood sugars were 49.   Assessment & Plan:   Physical deconditioning and debility, recurrent falls: No evidence of skeletal injury.  Has a history of stroke.  No acute neurological deficits but globally weak.  Work with PT OT to look for safe disposition. Scalp laceration.  Repaired.  Will need to remove the staples in 1 week from 1/30. Unsafe to mobilize.  Will need a SNF.  Presumptive UTI: Urinalysis was abnormal.  Unfortunately urine culture was not sent.  Will treat with Rocephin for 3 days.  Complete therapy today.  Hypoglycemia secondary to inadequate oral intake, moderate clinical dehydration, high anion gap metabolic acidosis: Blood sugars improved after regular intake.  Patient probably does not have adequate access to meal.  Social worker to evaluate. Encourage oral intake.  Discontinue IV fluids.  Hypokalemia: Replaced.  Recheck tomorrow.  Hypomagnesemia: Replaced.  Recheck tomorrow.  Hypophosphatemia: Replace further today.  Recheck tomorrow.  Schizophrenia: Take risperidone.  Continued.  Hyperlipidemia: Continue statin.   DVT prophylaxis: SCDs Start: 08/03/22 2341   Code Status: Full code Family Communication: None at bedside Disposition Plan: Status is: Inpatient.   Not safe to discharge to hotel.   Consultants:  None  Procedures:  None  Antimicrobials:  Rocephin 1/30---   Subjective: No overnight events.  Flat affect.  Does not communicate much.  Objective: Vitals:   08/05/22 1543 08/05/22 2248 08/06/22 0535 08/06/22 0715  BP: 121/86 111/73 98/82 111/69  Pulse: 87 92 98 83  Resp:  '16 16 18  '$ Temp: 98.6 F (37 C) 98.6 F (37 C) 98.4 F (36.9 C) 99 F (37.2 C)  TempSrc: Oral   Oral  SpO2: 100% 99% 96% 98%    Intake/Output Summary (Last 24 hours) at 08/06/2022 1127 Last data filed at 08/06/2022 5956 Gross per 24 hour  Intake 827.67 ml  Output 650 ml  Net 177.67 ml    There were no vitals filed for this visit.  Examination:  General exam: Appears calm and comfortable .  Debilitated.  Alert awake and oriented to self.  Withdrawn.  Flat affect.   Respiratory system: Clear to auscultation. Respiratory effort normal. Cardiovascular system: S1 & S2 heard, RRR. No pedal edema. Gastrointestinal system: Soft.  Nontender.  Bowel sound present. Central nervous system: Alert and oriented. No focal neurological deficits.  Globally weak. 2 staples intact, nontender clean and dry on the posterior scalp.    Data Reviewed: I have personally reviewed following labs and imaging studies  CBC: Recent Labs  Lab 08/03/22 1557 08/03/22 1613 08/04/22 0826  WBC 4.6  --  5.5  NEUTROABS 3.3  --   --   HGB 13.5 13.9 12.3  HCT 42.0 41.0 35.5*  MCV 83.7  --  80.5  PLT 44*  --  38*    Basic  Metabolic Panel: Recent Labs  Lab 08/03/22 1557 08/03/22 1613 08/04/22 0826 08/05/22 0430  NA 138 139 135  --   K 3.3* 3.6 2.8* 2.9*  CL 100 102 102  --   CO2 19*  --  18*  --   GLUCOSE 49* 46* 75  --   BUN 23* 25* 11  --   CREATININE 0.86 0.60 0.89  --   CALCIUM 9.6  --  8.5*  --   MG  --   --  1.6* 1.9  PHOS  --   --   --  1.7*    GFR: CrCl cannot be calculated (Unknown ideal weight.). Liver Function Tests: Recent Labs  Lab 08/03/22 1557  08/04/22 0826  AST 50* 38  ALT 27 23  ALKPHOS 91 75  BILITOT 1.7* 1.2  PROT 7.5 6.4*  ALBUMIN 4.4 3.6    No results for input(s): "LIPASE", "AMYLASE" in the last 168 hours. No results for input(s): "AMMONIA" in the last 168 hours. Coagulation Profile: No results for input(s): "INR", "PROTIME" in the last 168 hours. Cardiac Enzymes: Recent Labs  Lab 08/03/22 1557  CKTOTAL 416*    BNP (last 3 results) No results for input(s): "PROBNP" in the last 8760 hours. HbA1C: No results for input(s): "HGBA1C" in the last 72 hours. CBG: Recent Labs  Lab 08/03/22 2052 08/03/22 2117 08/03/22 2224  GLUCAP 45* 70 165*    Lipid Profile: No results for input(s): "CHOL", "HDL", "LDLCALC", "TRIG", "CHOLHDL", "LDLDIRECT" in the last 72 hours. Thyroid Function Tests: No results for input(s): "TSH", "T4TOTAL", "FREET4", "T3FREE", "THYROIDAB" in the last 72 hours. Anemia Panel: No results for input(s): "VITAMINB12", "FOLATE", "FERRITIN", "TIBC", "IRON", "RETICCTPCT" in the last 72 hours. Sepsis Labs: No results for input(s): "PROCALCITON", "LATICACIDVEN" in the last 168 hours.  No results found for this or any previous visit (from the past 240 hour(s)).       Radiology Studies: No results found.      Scheduled Meds:  famotidine  20 mg Oral Daily   linaclotide  290 mcg Oral QAC breakfast   risperiDONE  1 mg Oral BID   rosuvastatin  40 mg Oral Daily   Continuous Infusions:  cefTRIAXone (ROCEPHIN)  IV Stopped (08/05/22 2211)     LOS: 2 days    Time spent: 35 minutes    Barb Merino, MD [facility] Pager [phone]

## 2022-08-06 NOTE — Progress Notes (Signed)
CSW spoke with Vincente Liberty of [facility] who states she is in agreement for patient to be placed at a facility for short term rehab. Vincente Liberty would like bed offers presented to her once they become available.  CSW completed FL2 and faxed patient's clinical information out for review.  Patient will be a level 2 PASSR due to her mental health history.   Madilyn Fireman, MSW, LCSW Transitions of Care  Clinical Social Worker II [phone]

## 2022-08-06 NOTE — Plan of Care (Signed)
  Problem: Clinical Measurements: Goal: Will remain free from infection Outcome: Progressing Goal: Respiratory complications will improve Outcome: Progressing   Problem: Activity: Goal: Risk for activity intolerance will decrease Outcome: Progressing   Problem: Coping: Goal: Level of anxiety will decrease Outcome: Progressing   Problem: Elimination: Goal: Will not experience complications related to bowel motility Outcome: Progressing Goal: Will not experience complications related to urinary retention Outcome: Progressing   Problem: Pain Managment: Goal: General experience of comfort will improve Outcome: Progressing   Problem: Safety: Goal: Ability to remain free from injury will improve Outcome: Progressing   Problem: Skin Integrity: Goal: Risk for impaired skin integrity will decrease Outcome: Progressing   Problem: Nutrition: Goal: Adequate nutrition will be maintained Outcome: Not Progressing

## 2022-08-06 NOTE — TOC PASRR Note (Signed)
.  re: Kaitlyn Duncan Date of birth: [birth year] Date: 08/06/2022  TO WHOM IT MAY CONCERN:  Please be advised that the above named patient will require a short term nursing home stay, anticipated 30 days or less for rehabilitation and strengthening. The plan is for return home.

## 2022-08-07 DIAGNOSIS — F201 Disorganized schizophrenia: Secondary | ICD-10-CM | POA: Diagnosis not present

## 2022-08-07 DIAGNOSIS — R262 Difficulty in walking, not elsewhere classified: Secondary | ICD-10-CM | POA: Diagnosis not present

## 2022-08-07 DIAGNOSIS — E876 Hypokalemia: Secondary | ICD-10-CM | POA: Diagnosis not present

## 2022-08-07 LAB — BASIC METABOLIC PANEL
Anion gap: 6 (ref 5–15)
BUN: 13 mg/dL (ref 6–20)
CO2: 27 mmol/L (ref 22–32)
Calcium: 8.9 mg/dL (ref 8.9–10.3)
Chloride: 104 mmol/L (ref 98–111)
Creatinine, Ser: 0.61 mg/dL (ref 0.44–1.00)
GFR, Estimated: >60 mL/min (ref >60)
Glucose, Bld: 102 mg/dL — ABNORMAL HIGH (ref 70–99)
Potassium: 3.7 mmol/L (ref 3.5–5.1)
Sodium: 137 mmol/L (ref 135–145)

## 2022-08-07 LAB — MAGNESIUM: Magnesium: 1.7 mg/dL (ref 1.7–2.4)

## 2022-08-07 LAB — PHOSPHORUS: Phosphorus: 3.3 mg/dL (ref 2.5–4.6)

## 2022-08-07 NOTE — TOC CAGE-AID Note (Signed)
Transition of Care [facility]) - CAGE-AID Screening   Patient Details  Name: Kaitlyn Duncan MRN: [id number] Date of Birth: 03/12/1971  [facility] of [facility]) CM/SW Contact:    Benard Halsted, LCSW Phone Number: 08/07/2022, 12:11 PM   Clinical Narrative: Patient disoriented and not appropriate for screening at this time.    CAGE-AID Screening: Substance Abuse Screening unable to be completed due to: : Patient unable to participate

## 2022-08-07 NOTE — Progress Notes (Signed)
PROGRESS NOTE    Kaitlyn Duncan  EHU:[id number] DOB: 14-Nov-1970 DOA: 08/03/2022 PCP: Hoyt Koch, MD    Brief Narrative:  52 year old female with history of melanoma with brain mets status post chemotherapy and whole brain radiation, anemia of chronic disease, chronic thrombocytopenia, GERD and history of stroke presented from hotel room where she fell while she was trying to reach her laundry basket.  Reported mechanical fall.  Denied any dizziness lightheadedness.  In the emergency room skin survey was negative.  She had a scalp repaired with 2 staples.  CT head and MRI with old stroke no acute findings.  Admitted due to recurrent fall and unsafe discharge.  Urinalysis was abnormal.  Blood sugars were 49.   Assessment & Plan:   Physical deconditioning and debility, recurrent falls: No evidence of skeletal injury.  Has a history of stroke.  No acute neurological deficits but globally weak.  Work with PT OT to look for safe disposition. Scalp laceration.  Repaired.  Will need to remove the staples in 1 week from 1/30. Unsafe to mobilize.  Will need a SNF.  Presumptive UTI: Urinalysis was abnormal.  Unfortunately urine culture was not sent.  Will treat with Rocephin for 3 days.  Complete therapy today.  Hypoglycemia secondary to inadequate oral intake, moderate clinical dehydration, high anion gap metabolic acidosis: Blood sugars improved after regular intake.  Patient probably does not have adequate access to meal.  Social worker to evaluate. Encourage oral intake.  Discontinue IV fluids.  Electrolytes: Replaced aggressively.  Adequate.  Schizophrenia: Take risperidone.  Continued.  Hyperlipidemia: Continue statin.   DVT prophylaxis: SCDs Start: 08/03/22 2341   Code Status: Full code Family Communication: None at bedside Disposition Plan: Status is: Inpatient.  Not safe to discharge to hotel.   Consultants:  None  Procedures:  None  Antimicrobials:   Rocephin 1/30---   Subjective: Seen and examined.  Sleeping on her breakfast.  Flat affect.  Not much interactive.  No other overnight events as per nursing staff.  Objective: Vitals:   08/06/22 1554 08/07/22 0031 08/07/22 0431 08/07/22 0706  BP: 109/68 108/72 116/80 103/76  Pulse: 90 96 99 89  Resp: '18 17 18 18  '$ Temp: 99.3 F (37.4 C) 98.1 F (36.7 C) 98.2 F (36.8 C) 98.9 F (37.2 C)  TempSrc: Oral Oral Oral Oral  SpO2: 97% 97% 98% 97%   No intake or output data in the 24 hours ending 08/07/22 1058  There were no vitals filed for this visit.  Examination:  General exam: Appears calm and comfortable .  Debilitated.  Alert to stimulation but wants to sleep.  Flat affect.  Does not want to interact. Respiratory system: Clear to auscultation. Respiratory effort normal. Cardiovascular system: S1 & S2 heard, RRR. No pedal edema. Gastrointestinal system: Soft.  Nontender.  Bowel sound present. Central nervous system: Alert and oriented. No focal neurological deficits.  Globally weak. 2 staples intact, nontender clean and dry on the posterior scalp.    Data Reviewed: I have personally reviewed following labs and imaging studies  CBC: Recent Labs  Lab 08/03/22 1557 08/03/22 1613 08/04/22 0826  WBC 4.6  --  5.5  NEUTROABS 3.3  --   --   HGB 13.5 13.9 12.3  HCT 42.0 41.0 35.5*  MCV 83.7  --  80.5  PLT 44*  --  38*   Basic Metabolic Panel: Recent Labs  Lab 08/03/22 1557 08/03/22 1613 08/04/22 0826 08/05/22 0430 08/07/22 0223  NA 138 139  135  --  137  K 3.3* 3.6 2.8* 2.9* 3.7  CL 100 102 102  --  104  CO2 19*  --  18*  --  27  GLUCOSE 49* 46* 75  --  102*  BUN 23* 25* 11  --  13  CREATININE 0.86 0.60 0.89  --  0.61  CALCIUM 9.6  --  8.5*  --  8.9  MG  --   --  1.6* 1.9 1.7  PHOS  --   --   --  1.7* 3.3   GFR: CrCl cannot be calculated (Unknown ideal weight.). Liver Function Tests: Recent Labs  Lab 08/03/22 1557 08/04/22 0826  AST 50* 38  ALT 27 23   ALKPHOS 91 75  BILITOT 1.7* 1.2  PROT 7.5 6.4*  ALBUMIN 4.4 3.6   No results for input(s): "LIPASE", "AMYLASE" in the last 168 hours. No results for input(s): "AMMONIA" in the last 168 hours. Coagulation Profile: No results for input(s): "INR", "PROTIME" in the last 168 hours. Cardiac Enzymes: Recent Labs  Lab 08/03/22 1557  CKTOTAL 416*   BNP (last 3 results) No results for input(s): "PROBNP" in the last 8760 hours. HbA1C: No results for input(s): "HGBA1C" in the last 72 hours. CBG: Recent Labs  Lab 08/03/22 2052 08/03/22 2117 08/03/22 2224  GLUCAP 45* 70 165*   Lipid Profile: No results for input(s): "CHOL", "HDL", "LDLCALC", "TRIG", "CHOLHDL", "LDLDIRECT" in the last 72 hours. Thyroid Function Tests: No results for input(s): "TSH", "T4TOTAL", "FREET4", "T3FREE", "THYROIDAB" in the last 72 hours. Anemia Panel: No results for input(s): "VITAMINB12", "FOLATE", "FERRITIN", "TIBC", "IRON", "RETICCTPCT" in the last 72 hours. Sepsis Labs: No results for input(s): "PROCALCITON", "LATICACIDVEN" in the last 168 hours.  No results found for this or any previous visit (from the past 240 hour(s)).       Radiology Studies: No results found.      Scheduled Meds:  famotidine  20 mg Oral Daily   linaclotide  290 mcg Oral QAC breakfast   risperiDONE  1 mg Oral BID   rosuvastatin  40 mg Oral Daily   Continuous Infusions:     LOS: 3 days    Time spent: 35 minutes    Barb Merino, MD [facility] Pager [phone]

## 2022-08-08 DIAGNOSIS — F201 Disorganized schizophrenia: Secondary | ICD-10-CM

## 2022-08-08 DIAGNOSIS — R262 Difficulty in walking, not elsewhere classified: Secondary | ICD-10-CM | POA: Diagnosis not present

## 2022-08-08 DIAGNOSIS — E876 Hypokalemia: Secondary | ICD-10-CM | POA: Diagnosis not present

## 2022-08-08 MED ORDER — RISPERIDONE 0.5 MG PO TABS
0.5000 mg | ORAL_TABLET | Freq: Two times a day (BID) | ORAL | Status: DC
  Administered 2022-08-08 – 2022-08-12 (×8): 0.5 mg via ORAL
  Filled 2022-08-08 (×8): qty 1

## 2022-08-08 NOTE — Consult Note (Signed)
Kaitlyn Duncan Psychiatry New Face-to-Face Psychiatric Evaluation   Service Date: August 08, 2022 LOS:  LOS: 4 days    Assessment  Kaitlyn Duncan is a 52 y.o. female admitted medically for 08/03/2022  1:35 PM for physical deconditioning and debility, recurrent falls. She carries the psychiatric diagnoses of schizophrenia and has a past medical history of melanoma with brain mets status postchemotherapy, anemia of chronic disease, thrombocytopenia, GERD, history of strokes.Psychiatry was consulted for apathy and schizophrenia for medication management by primary treating provider.    Patient's current presentation does not meet criteria for psychiatric hospitalization.  Current outpatient psychotropic medications include risperidone 1 mg twice daily and historically she has had a fair response to these medications. She was compliant with medications prior to admission as evidenced by chart review. On initial examination, patient presents with limited interaction but not overtly psychotic responding to stimuli or paranoid. Please see plan below for detailed recommendations.   Diagnoses:  Active Hospital problems: Principal Problem:   Falls Active Problems:   UTI (urinary tract infection)     Plan  ## Safety and Observation Level:  - Based on my clinical evaluation, I estimate the patient to be at minimal acute risk of self harm in the current setting - At this time, we recommend no change to current level of observation. This decision is based on my review of the chart including patient's history and current presentation, interview of the patient, mental status examination, and consideration of suicide risk including evaluating suicidal ideation, plan, intent, suicidal or self-harm behaviors, risk factors, and protective factors. This judgment is based on our ability to directly address suicide risk, implement suicide prevention strategies and develop a safety plan while the patient  is in the clinical setting. Please contact our team if there is a concern that risk level has changed.   ## Medications:  -- Decrease risperidone from 1 mg twice daily to 0.5 mg twice daily to address reports of apathy and limited interaction, may consider switching to Abilify if needed.  ## Medical Decision Making Capacity:  Patient has a guardian  ## Further Work-up:  -- Defer to primary treating team   -- most recent EKG on 12/13 had QtC of 460 -- Pertinent labwork were reviewed earlier this admission  ## Disposition:  -- Patient does not meet criteria for psychiatric hospitalization Placement in process for safe discharge planning  ## Behavioral / Environmental:  -- As above   Thank you for this consult request. Recommendations have been communicated to the primary team.  We will follow-up at this time.   Kaitlyn Nanez Winfred Leeds, MD   NEW history  Relevant Aspects of Hospital Course:  Admitted on 08/03/2022 for physical deconditioning and debility, recent falls.  Patient Report:  Upon evaluation patient is very limited historian, very limited verbal interaction she is able to tell me her name and age notes she is in the hospital for dehydration and falls, reports she lives at a hotel, she denies any history of mental illness when asked and denies being on psychotropic medications which is not correct when asking her about being on psychotropic medications including risperidone she responds "I do not know" she denies feeling depressed or anxious, she denies any passive or active SI intention or plan, she denies HI or AVH.  When asked details about previous psychiatric admissions she responds "I do not know" she denies having suicide attempts in the past.  Later she tells me that she sees ACT team in the  area but I am unable to confirm.  She reports fair sleep and appetite and denies symptoms consistent with mania or hypomania or PTSD.  ROS:  As above  Collateral information:  None at  this time  Psychiatric History:  Information collected from patient and chart review Multiple emergency room visits for psychosis with history of transfer to old [facility] in December 2023, history of admission to: [facility] in 2022 with bipolar disorder diagnoses as well as psychosis, was discharged on Prolixin at that time.  Short visit to urgent care behavioral health in December 2023 after being reported of being agitated by hotel staff where she resides, she was monitored at urgent care, IVC was later rescinded and patient was discharged on risperidone.  Family psych history: Unknown   Social History:  Lives at hotel  Tobacco use: None noted per chart review Alcohol use: None noted per chart review Drug use: None noted per chart review  Family History:   The patient's family history includes Breast cancer in her maternal aunt, maternal grandmother, paternal aunt, and paternal grandmother; Diabetes in her paternal aunt and paternal grandmother; Hypertension in her father; Melanoma in her mother.  Medical History: Past Medical History:  Diagnosis Date   Allergy    Anemia    Gallstones    GERD (gastroesophageal reflux disease)    Hemorrhoids    melanoma with met dz dx'd 01/2018   brain and adrenal   Seasonal allergies    Stroke [facility])     Surgical History: Past Surgical History:  Procedure Laterality Date   BIOPSY  01/28/2018   Procedure: BIOPSY;  Surgeon: Laurence Spates, MD;  Location: [facility] ENDOSCOPY;  Service: Endoscopy;;   BREAST BIOPSY Left    ESOPHAGOGASTRODUODENOSCOPY N/A 01/28/2018   Procedure: ESOPHAGOGASTRODUODENOSCOPY (EGD);  Surgeon: Laurence Spates, MD;  Location: [facility];  Service: Endoscopy;  Laterality: N/A;   MOUTH SURGERY     OVARY SURGERY     removed "something"    Medications:   Current Facility-Administered Medications:    acetaminophen (TYLENOL) tablet 650 mg, 650 mg, Oral, Q6H PRN, Hall, Carole N, DO   famotidine (PEPCID) tablet 20 mg, 20 mg,  Oral, Daily, Barb Merino, MD, 20 mg at 08/08/22 0949   linaclotide (LINZESS) capsule 290 mcg, 290 mcg, Oral, QAC breakfast, Irene Pap N, DO, 290 mcg at 08/08/22 0949   polyethylene glycol (MIRALAX / GLYCOLAX) packet 17 g, 17 g, Oral, Daily PRN, [place] Crane, Carole N, DO   risperiDONE (RISPERDAL) tablet 1 mg, 1 mg, Oral, BID, Hall, Carole N, DO, 1 mg at 08/08/22 1610   rosuvastatin (CRESTOR) tablet 40 mg, 40 mg, Oral, Daily, Irene Pap N, DO, 40 mg at 08/08/22 9604  Allergies: No Known Allergies     Objective  Vital signs:  Temp:  [97.7 F (36.5 C)-99.4 F (37.4 C)] 97.7 F (36.5 C) (02/04 0715) Pulse Rate:  [87-98] 87 (02/04 0715) Resp:  [18] 18 (02/04 0715) BP: (102-116)/(67-82) 102/67 (02/04 0715) SpO2:  [96 %-97 %] 96 % (02/04 0715)  Psychiatric Specialty Exam:  Presentation  General Appearance:  Appropriate for Environment  Eye Contact: Poor  Speech: Garbled  Speech Volume: Decreased  Handedness: Right   Mood and Affect  Mood: Depressed  Affect: Flat   Thought Process  Thought Processes: Disorganized  Descriptions of Associations:Circumstantial  Orientation:Partial  Thought Content:Illogical  History of Schizophrenia/Schizoaffective disorder:Yes  Duration of Psychotic Symptoms:Greater than six months  Hallucinations:Hallucinations: None  Ideas of Reference:None  Suicidal Thoughts:Suicidal Thoughts: No  Homicidal  Thoughts:Homicidal Thoughts: No   Sensorium  Memory: Immediate Poor; Recent Poor  Judgment: Poor  Insight: Poor   Executive Functions  Concentration: Poor  Attention Span: Poor  Recall: Poor  Fund of Knowledge: Poor  Language: Fair   Psychomotor Activity  Psychomotor Activity:Psychomotor Activity: Decreased   Assets  Assets: Social Support   Sleep  Sleep:No data recorded   Physical Exam: Physical Exam ROS Blood pressure 102/67, pulse 87, temperature 97.7 F (36.5 C), temperature source  Oral, resp. rate 18, SpO2 96 %. There is no height or weight on file to calculate BMI.

## 2022-08-08 NOTE — Plan of Care (Signed)

## 2022-08-08 NOTE — Progress Notes (Signed)
PROGRESS NOTE    Kaitlyn Duncan  SWN:[id number] DOB: August 27, 1970 DOA: 08/03/2022 PCP: Hoyt Koch, MD    Brief Narrative:  52 year old female with history of melanoma with brain mets status post chemotherapy and whole brain radiation, anemia of chronic disease, chronic thrombocytopenia, GERD and history of stroke presented from hotel room where she fell while she was trying to reach her laundry basket.  Reported mechanical fall.  Denied any dizziness lightheadedness.  In the emergency room skin survey was negative.  She had a scalp repaired with 2 staples.  CT head and MRI with old stroke no acute findings.  Admitted due to recurrent fall and unsafe discharge.  Urinalysis was abnormal.  Blood sugars were 49.   Assessment & Plan:   Physical deconditioning and debility, recurrent falls: No evidence of skeletal injury.  Has a history of stroke.  No acute neurological deficits but globally weak.  Work with PT OT to look for safe disposition. Scalp laceration.  Repaired.  Will need to remove the staples in 1 week from 1/30. Unsafe to mobilize.  Will need a SNF.  Presumptive UTI: Urinalysis was abnormal.  Unfortunately urine culture was not sent.  Completed 3 days of Rocephin.  Hypoglycemia secondary to inadequate oral intake, moderate clinical dehydration, high anion gap metabolic acidosis: Blood sugars improved after regular intake.  Patient probably does not have adequate access to meal.  Social worker to evaluate. Encourage oral intake.  Discontinue IV fluids and monitor.  Electrolytes: Replaced aggressively.  Adequate.  Schizophrenia: Take risperidone.  Continued. Patient remains more apathetic and not participating and not eating.  Will consult psychiatry for evaluation/medication adjustments as needed.  Hyperlipidemia: Continue statin.   DVT prophylaxis: SCDs Start: 08/03/22 2341   Code Status: Full code Family Communication: None at bedside Disposition Plan: Status  is: Inpatient.  Not safe to discharge to hotel.   Consultants:  None  Procedures:  None  Antimicrobials:  Rocephin 1/30---   Subjective: Patient seen and examined.  Since yesterday, patient keeps sleeping all the time.  She is so selective.  Does not want to eat.  Sometimes answers questions but mostly flat affect.  Remains very apathetic.  Objective: Vitals:   08/07/22 1557 08/07/22 1958 08/08/22 0423 08/08/22 0715  BP: 110/68 116/82 106/68 102/67  Pulse: 98 90 89 87  Resp: '18 18 18 18  '$ Temp: 98.8 F (37.1 C) 98.7 F (37.1 C) 99.4 F (37.4 C) 97.7 F (36.5 C)  TempSrc: Oral Axillary Axillary Oral  SpO2: 96% 97% 97% 96%    Intake/Output Summary (Last 24 hours) at 08/08/2022 0957 Last data filed at 08/08/2022 0620 Gross per 24 hour  Intake 100 ml  Output 250 ml  Net -150 ml    There were no vitals filed for this visit.  Examination:  General exam: Appears calm and comfortable .  Frail and debilitated.   Sleepy.  Difficult to keep conversation.  Respiratory system: Clear to auscultation. Respiratory effort normal. Cardiovascular system: S1 & S2 heard, RRR. No pedal edema. Gastrointestinal system: Soft.  Nontender.  Bowel sound present. Central nervous system: Alert and oriented. No focal neurological deficits.  Globally weak. 2 staples intact, nontender clean and dry on the posterior scalp.    Data Reviewed: I have personally reviewed following labs and imaging studies  CBC: Recent Labs  Lab 08/03/22 1557 08/03/22 1613 08/04/22 0826  WBC 4.6  --  5.5  NEUTROABS 3.3  --   --   HGB 13.5 13.9 12.3  HCT 42.0 41.0 35.5*  MCV 83.7  --  80.5  PLT 44*  --  38*   Basic Metabolic Panel: Recent Labs  Lab 08/03/22 1557 08/03/22 1613 08/04/22 0826 08/05/22 0430 08/07/22 0223  NA 138 139 135  --  137  K 3.3* 3.6 2.8* 2.9* 3.7  CL 100 102 102  --  104  CO2 19*  --  18*  --  27  GLUCOSE 49* 46* 75  --  102*  BUN 23* 25* 11  --  13  CREATININE 0.86 0.60 0.89   --  0.61  CALCIUM 9.6  --  8.5*  --  8.9  MG  --   --  1.6* 1.9 1.7  PHOS  --   --   --  1.7* 3.3   GFR: CrCl cannot be calculated (Unknown ideal weight.). Liver Function Tests: Recent Labs  Lab 08/03/22 1557 08/04/22 0826  AST 50* 38  ALT 27 23  ALKPHOS 91 75  BILITOT 1.7* 1.2  PROT 7.5 6.4*  ALBUMIN 4.4 3.6   No results for input(s): "LIPASE", "AMYLASE" in the last 168 hours. No results for input(s): "AMMONIA" in the last 168 hours. Coagulation Profile: No results for input(s): "INR", "PROTIME" in the last 168 hours. Cardiac Enzymes: Recent Labs  Lab 08/03/22 1557  CKTOTAL 416*   BNP (last 3 results) No results for input(s): "PROBNP" in the last 8760 hours. HbA1C: No results for input(s): "HGBA1C" in the last 72 hours. CBG: Recent Labs  Lab 08/03/22 2052 08/03/22 2117 08/03/22 2224  GLUCAP 45* 70 165*   Lipid Profile: No results for input(s): "CHOL", "HDL", "LDLCALC", "TRIG", "CHOLHDL", "LDLDIRECT" in the last 72 hours. Thyroid Function Tests: No results for input(s): "TSH", "T4TOTAL", "FREET4", "T3FREE", "THYROIDAB" in the last 72 hours. Anemia Panel: No results for input(s): "VITAMINB12", "FOLATE", "FERRITIN", "TIBC", "IRON", "RETICCTPCT" in the last 72 hours. Sepsis Labs: No results for input(s): "PROCALCITON", "LATICACIDVEN" in the last 168 hours.  No results found for this or any previous visit (from the past 240 hour(s)).       Radiology Studies: No results found.      Scheduled Meds:  famotidine  20 mg Oral Daily   linaclotide  290 mcg Oral QAC breakfast   risperiDONE  1 mg Oral BID   rosuvastatin  40 mg Oral Daily   Continuous Infusions:     LOS: 4 days    Time spent: 35 minutes    Barb Merino, MD [facility] Pager [phone]

## 2022-08-09 DIAGNOSIS — N3000 Acute cystitis without hematuria: Secondary | ICD-10-CM | POA: Diagnosis not present

## 2022-08-09 DIAGNOSIS — F250 Schizoaffective disorder, bipolar type: Secondary | ICD-10-CM

## 2022-08-09 DIAGNOSIS — M25571 Pain in right ankle and joints of right foot: Secondary | ICD-10-CM | POA: Diagnosis not present

## 2022-08-09 DIAGNOSIS — E876 Hypokalemia: Secondary | ICD-10-CM | POA: Diagnosis not present

## 2022-08-09 DIAGNOSIS — R262 Difficulty in walking, not elsewhere classified: Secondary | ICD-10-CM | POA: Diagnosis not present

## 2022-08-09 NOTE — Plan of Care (Signed)

## 2022-08-09 NOTE — Consult Note (Signed)
[facility] Followup Face-to-Face Psychiatric Evaluation   Service Date: August 09, 2022 LOS:  LOS: 5 days    Assessment  Kaitlyn Duncan is a 52 y.o. female admitted medically for 08/03/2022  1:35 PM for physical deconditioning and debility, recurrent falls. She carries the psychiatric diagnoses of schizophrenia and has a past medical history of melanoma with brain mets status postchemotherapy, anemia of chronic disease, thrombocytopenia, GERD, history of strokes.Psychiatry was consulted for apathy and schizophrenia for medication management by primary treating provider.    Patient's current presentation does not meet criteria for psychiatric hospitalization.  Current outpatient psychotropic medications include risperidone 1 mg twice daily and historically she has had a fair response to these medications. She was compliant with medications prior to admission as evidenced by chart review. On initial examination 2/4, patient presents with limited interaction but not overtly psychotic responding to stimuli or paranoid.  On assessment today, patient is more interactive but slowed in her movements.  She is not overtly psychotic, not delusional, paranoid, nor responding to internal or external stimuli.  Recommend engaging legal guardian for patient's medical care.  Please see plan below for detailed recommendations.   Diagnoses:  Active Hospital problems: Principal Problem:   Falls Active Problems:   UTI (urinary tract infection)     Plan  ## Safety and Observation Level:  - Based on my clinical evaluation, I estimate the patient to be at minimal acute risk of self harm in the current setting - At this time, we recommend no change to current level of observation. This decision is based on my review of the chart including patient's history and current presentation, interview of the patient, mental status examination, and consideration of suicide risk including evaluating  suicidal ideation, plan, intent, suicidal or self-harm behaviors, risk factors, and protective factors. This judgment is based on our ability to directly address suicide risk, implement suicide prevention strategies and develop a safety plan while the patient is in the clinical setting. Please contact our team if there is a concern that risk level has changed.   ## Medications:  -- Continue risperidone 0.5 mg twice daily to address reports of apathy and limited interaction, may consider switching to Abilify if needed; not currently indicated at this time  ## Medical Decision Making Capacity:  Patient has a guardian  ## Further Work-up:  -- Defer to primary treating team   -- most recent EKG on 12/13 had QtC of 460 -- Pertinent labwork were reviewed earlier this admission  ## Disposition:  -- Patient does not meet criteria for psychiatric hospitalization Placement in process for safe discharge planning  ## Behavioral / Environmental:  -- As above   Thank you for this consult request. Recommendations have been communicated to the primary team.  We will sign off at this time.  Should acute concerns arise, please do not hesitate to reconsult our team.  Rosezetta Schlatter, MD   NEW history  Relevant Aspects of Hospital Course:  Admitted on 08/03/2022 for physical deconditioning and debility, recent falls.  Patient Report:  Upon evaluation patient is appropriately responsive to all questions asked.  She does however state that the physical therapists are "in her way" and that she would be able to get better if everyone would just leave her alone. This is a statement patient would verbalize at baseline. She denies feeling depressed or anxious, she denies any passive or active SI intention or plan, she denies HI or AVH.  She is initially leaning  to the left in her bedside chair while slowly buttering her pancakes, giving concern for catatonia.  However [name] rating scale for catatonia is  negative.  Patient is slowed in her movements and speaks softly.  She reports that her support is her ACT team and denies familial support.  She reports fair sleep and appetite.  ROS:  As above  Collateral information:  None at this time  Psychiatric History:  Information collected from patient and chart review Multiple emergency room visits for psychosis with history of transfer to old [facility] in December 2023, history of admission to: [facility] in 2022 with bipolar disorder diagnoses as well as psychosis, was discharged on Prolixin at that time.  Short visit to urgent care behavioral health in December 2023 after being reported of being agitated by hotel staff where she resides, she was monitored at urgent care, IVC was later rescinded and patient was discharged on risperidone.  Family psych history: Unknown   Social History:  Lives at hotel  Tobacco use: None noted per chart review Alcohol use: None noted per chart review Drug use: None noted per chart review  Family History:   The patient's family history includes Breast cancer in her maternal aunt, maternal grandmother, paternal aunt, and paternal grandmother; Diabetes in her paternal aunt and paternal grandmother; Hypertension in her father; Melanoma in her mother.  Medical History: Past Medical History:  Diagnosis Date   Allergy    Anemia    Gallstones    GERD (gastroesophageal reflux disease)    Hemorrhoids    melanoma with met dz dx'd 01/2018   brain and adrenal   Seasonal allergies    Stroke [facility])     Surgical History: Past Surgical History:  Procedure Laterality Date   BIOPSY  01/28/2018   Procedure: BIOPSY;  Surgeon: Laurence Spates, MD;  Location: [facility] ENDOSCOPY;  Service: Endoscopy;;   BREAST BIOPSY Left    ESOPHAGOGASTRODUODENOSCOPY N/A 01/28/2018   Procedure: ESOPHAGOGASTRODUODENOSCOPY (EGD);  Surgeon: Laurence Spates, MD;  Location: [facility];  Service: Endoscopy;  Laterality: N/A;   MOUTH SURGERY      OVARY SURGERY     removed "something"    Medications:   Current Facility-Administered Medications:    acetaminophen (TYLENOL) tablet 650 mg, 650 mg, Oral, Q6H PRN, [place] Crane, Carole N, DO   famotidine (PEPCID) tablet 20 mg, 20 mg, Oral, Daily, Barb Merino, MD, 20 mg at 08/09/22 2878   linaclotide (LINZESS) capsule 290 mcg, 290 mcg, Oral, QAC breakfast, Irene Pap N, DO, 290 mcg at 08/09/22 0941   polyethylene glycol (MIRALAX / GLYCOLAX) packet 17 g, 17 g, Oral, Daily PRN, [place] Crane, Carole N, DO   risperiDONE (RISPERDAL) tablet 0.5 mg, 0.5 mg, Oral, BID, Winfred Leeds, Nadir, MD, 0.5 mg at 08/09/22 0942   rosuvastatin (CRESTOR) tablet 40 mg, 40 mg, Oral, Daily, Irene Pap N, DO, 40 mg at 08/09/22 6767  Allergies: No Known Allergies     Objective  Vital signs:  Temp:  [97.6 F (36.4 C)-98.9 F (37.2 C)] 98 F (36.7 C) (02/05 0438) Pulse Rate:  [96-99] 96 (02/05 0438) Resp:  [16-18] 16 (02/05 0438) BP: (121-135)/(81-89) 124/81 (02/05 0438) SpO2:  [94 %-98 %] 94 % (02/05 0438)  Psychiatric Specialty Exam:  Presentation  General Appearance:  Appropriate for Environment  Eye Contact: Fleeting  Speech: Slow  Speech Volume: Decreased  Handedness: Right   Mood and Affect  Mood: Depressed  Affect: Flat   Thought Process  Thought Processes: Coherent; Goal Directed  Descriptions  of Associations:Intact  Orientation:Full (Time, Place and Person)  Thought Content:WDL  History of Schizophrenia/Schizoaffective disorder:Yes  Duration of Psychotic Symptoms:Greater than six months  Hallucinations:Hallucinations: None  Ideas of Reference:None  Suicidal Thoughts:Suicidal Thoughts: No  Homicidal Thoughts:Homicidal Thoughts: No   Sensorium  Memory: Immediate Good; Recent Good  Judgment: Poor  Insight: Fair   Materials engineer: Fair  Attention Span: Fair  Recall: [place] of Knowledge: Fair  Language: Fair   Psychomotor  Activity  Psychomotor Activity:Psychomotor Activity: Decreased (No tremors nor rigidity.)   Assets  Assets: Housing; Social Support   Sleep  Sleep:Sleep: Fair    Physical Exam: Physical Exam Vitals reviewed.  Constitutional:      Appearance: She is ill-appearing.     Comments: Chronically ill appearing  HENT:     Head: Normocephalic and atraumatic.     Mouth/Throat:     Mouth: Mucous membranes are dry.  Pulmonary:     Effort: Pulmonary effort is normal.  Skin:    General: Skin is warm and dry.  Neurological:     General: No focal deficit present.     Mental Status: She is alert and oriented to person, place, and time.     Motor: Weakness present.     Blood pressure 124/81, pulse 96, temperature 98 F (36.7 C), temperature source Oral, resp. rate 16, SpO2 94 %. There is no height or weight on file to calculate BMI.

## 2022-08-09 NOTE — Care Management Important Message (Signed)
Important Message  Patient Details  Name: Kaitlyn Duncan MRN: [id number] Date of Birth: 1970-10-14   Medicare Important Message Given:  Yes     Orbie Pyo 08/09/2022, 2:24 PM

## 2022-08-09 NOTE — Progress Notes (Signed)
Physical Therapy Treatment Patient Details Name: Kaitlyn Duncan MRN: [id number] DOB: 1970/10/26 Today's Date: 08/09/2022   History of Present Illness 52 y.o. female admitted 1/30 with fall and head lac. PMhx: melanoma with brain mets s/p chemo and whole brain radiation, anemia of chronic disease, schizophrenia, chronic thrombocytopenia, GERD, CVA, multiple recent falls with ED visits.    PT Comments    Pt with flat affect, impaired ability to rise and continues to have no awareness of her deficits or assist level. In standing pt staing she was walking to bathroom but required 5 min to take a single step and urinating on floor with BSC pulled to her. +2 assist for pivots and stepping at this time with [facility] still appropriate. Goals downgraded.    Recommendations for follow up therapy are one component of a multi-disciplinary discharge planning process, led by the attending physician.  Recommendations may be updated based on patient status, additional functional criteria and insurance authorization.  Follow Up Recommendations  Skilled nursing-short term rehab (<3 hours/day) Can patient physically be transported by private vehicle: No   Assistance Recommended at Discharge Frequent or constant Supervision/Assistance  Patient can return home with the following A lot of help with walking and/or transfers;Assist for transportation;A lot of help with bathing/dressing/bathroom   Equipment Recommendations  Rolling walker (2 wheels);Wheelchair (measurements PT);BSC/3in1    Recommendations for Other Services       Precautions / Restrictions Precautions Precautions: Fall Precaution Comments: incontinent     Mobility  Bed Mobility Overal bed mobility: Needs Assistance Bed Mobility: Supine to Sit     Supine to sit: Max assist, HOB elevated     General bed mobility comments: HOb 25 degrees with max assist and pad to pivot to EOB, knee portion locked in flexion so max assist to scoot to  [facility] for flat surface to gain balance    Transfers Overall transfer level: Needs assistance   Transfers: Sit to/from Stand, Bed to chair/wheelchair/BSC Sit to Stand: Min assist, +2 safety/equipment Stand pivot transfers: Mod assist, +2 physical assistance         General transfer comment: pt with min assist to rise from elevated surface and grossly 5 min to stand then step one step forward. Pt with BSc pulled under pt with pt unable to succesfully scoot back on bSC. Mod +2 to stand and pivot BSC to recliner. Total +2 to slide back in chair    Ambulation/Gait                   Stairs             Wheelchair Mobility    Modified Rankin (Stroke Patients Only)       Balance Overall balance assessment: Needs assistance Sitting-balance support: Feet supported Sitting balance-Leahy Scale: Poor Sitting balance - Comments: posterior right lean in sitting with increased time to correct to midline   Standing balance support: Bilateral upper extremity supported, Reliant on assistive device for balance Standing balance-Leahy Scale: Poor Standing balance comment: bil UE on RW flexed trunk, anterior lean, decreased RLE stance                            Cognition Arousal/Alertness: Awake/alert Behavior During Therapy: Flat affect Overall Cognitive Status: Impaired/Different from baseline Area of Impairment: Problem solving, Following commands, Attention, Safety/judgement, Orientation, Memory                 Orientation Level: Disoriented to,  Time, Situation Current Attention Level: Sustained Memory: Decreased short-term memory Following Commands: Follows one step commands inconsistently, Follows one step commands with increased time Safety/Judgement: Decreased awareness of safety, Decreased awareness of deficits   Problem Solving: Slow processing, Decreased initiation, Requires verbal cues, Requires tactile cues, Difficulty sequencing General  Comments: pt with extremely slow movements, no apparent initiation for bed mobility despite max cues, stating she is walking to bathroom but unable to step        Exercises      General Comments        Pertinent Vitals/Pain Pain Assessment Pain Assessment: No/denies pain    Home Living                          Prior Function            PT Goals (current goals can now be found in the care plan section) Progress towards PT goals: Progressing toward goals    Frequency    Min 2X/week      PT Plan Current plan remains appropriate;Frequency needs to be updated    Co-evaluation              AM-PAC PT "6 Clicks" Mobility   Outcome Measure  Help needed turning from your back to your side while in a flat bed without using bedrails?: A Lot Help needed moving from lying on your back to sitting on the side of a flat bed without using bedrails?: A Lot Help needed moving to and from a bed to a chair (including a wheelchair)?: Total Help needed standing up from a chair using your arms (e.g., wheelchair or bedside chair)?: Total Help needed to walk in hospital room?: Total Help needed climbing 3-5 steps with a railing? : Total 6 Click Score: 8    End of Session Equipment Utilized During Treatment: Gait belt Activity Tolerance: Patient tolerated treatment well Patient left: in chair;with call bell/phone within reach;with chair alarm set Nurse Communication: Mobility status PT Visit Diagnosis: Other abnormalities of gait and mobility (R26.89);Repeated falls (R29.6);Other symptoms and signs involving the nervous system (R29.898)     Time: 4193-7902 PT Time Calculation (min) (ACUTE ONLY): 36 min  Charges:  $Therapeutic Activity: 23-37 mins                     Bayard Males, PT Acute Rehabilitation Services Office: [place] 08/09/2022, 10:04 AM

## 2022-08-09 NOTE — TOC Progression Note (Signed)
Transition of Care [facility]) - Progression Note    Patient Details  Name: Druanne Bosques MRN: [id number] Date of Birth: 1971/02/25  Transition of [facility]) CM/SW Contact  Jinger Neighbors, [place] Phone Number: 08/09/2022, 9:20 AM  Clinical Narrative:     CSW received pt's PASRR number: [id number]  Expected Discharge Plan: [place] Barriers to Discharge: Continued Medical Work up  Expected Discharge Plan and Services   Discharge Planning Services: CM Consult   Living arrangements for the past 2 months: Hotel/Motel                                       Social Determinants of Health (SDOH) Interventions SDOH Screenings   Food Insecurity: Food Insecurity Present (08/04/2022)  Housing: High Risk (04/09/2022)  Transportation Needs: Unmet Transportation Needs (08/04/2022)  Utilities: Not At Risk (08/04/2022)  Alcohol Screen: Low Risk  (06/27/2021)  Depression (PHQ2-9): Low Risk  (07/21/2022)  Tobacco Use: Low Risk  (08/03/2022)    Readmission Risk Interventions    08/04/2022    3:35 PM  Readmission Risk Prevention Plan  Transportation Screening Complete  PCP or Specialist Appt within 3-5 Days Complete  HRI or [place] Complete  Social Work Consult for [place] Planning/Counseling Complete  Palliative Care Screening Complete  Medication Review Press photographer) Complete

## 2022-08-09 NOTE — Progress Notes (Signed)
PROGRESS NOTE    Kaitlyn Duncan  FAO:[id number] DOB: Oct 04, 1970 DOA: 08/03/2022 PCP: Hoyt Koch, MD    Brief Narrative:  52 year old female with history of melanoma with brain mets status post chemotherapy and whole brain radiation, anemia of chronic disease, chronic thrombocytopenia, GERD and history of stroke presented from hotel room where she fell while she was trying to reach her laundry basket.  Reported mechanical fall.  Denied any dizziness lightheadedness.  In the emergency room skin survey was negative.  She had a scalp repaired with 2 staples.  CT head and MRI with old stroke no acute findings.  Admitted due to recurrent fall and unsafe discharge.  Urinalysis was abnormal.  Blood sugars were 49. Waiting for skilled nursing facility placement.  Remained very apathetic, psychiatry was consulted and Risperdal was decreased to half.   Assessment & Plan:   Physical deconditioning and debility, recurrent falls: No evidence of skeletal injury.  Has a history of stroke.  No acute neurological deficits but globally weak.   Needs a skilled nursing facility placement. She had a scalp laceration that was repaired.  Will remove the staples.  Presumptive UTI: Urinalysis was abnormal.  Unfortunately urine culture was not sent.  Completed 3 days of Rocephin.  Hypoglycemia secondary to inadequate oral intake, moderate clinical dehydration, high anion gap metabolic acidosis: Blood sugars improved after regular intake.  Patient probably does not have adequate access to meal.  Social worker to evaluate. Encourage oral intake.  Blood sugars remained stable.  Electrolytes: Replaced aggressively.  Adequate.  Schizophrenia: Take risperidone.  Continued. Patient remains more apathetic and not participating and not eating.   Psych consulted.  Risperidone dosing decreased.    Hyperlipidemia: Continue statin.   DVT prophylaxis: SCDs Start: 08/03/22 2341   Code Status: Full  code Family Communication: None at bedside Disposition Plan: Status is: Inpatient.  Not safe to discharge to hotel.   Consultants:  None  Procedures:  None  Antimicrobials:  Rocephin 1/30---   Subjective:  Patient seen and examined.  Working with therapies.  Takes forever for her to follow commands and mobilize around.  She does not have any awareness about her deficits.  Objective: Vitals:   08/08/22 0715 08/08/22 1608 08/08/22 1931 08/09/22 0438  BP: 102/67 135/89 121/87 124/81  Pulse: 87 98 99 96  Resp: '18 18 18 16  '$ Temp: 97.7 F (36.5 C) 97.6 F (36.4 C) 98.9 F (37.2 C) 98 F (36.7 C)  TempSrc: Oral Oral Oral Oral  SpO2: 96% 96% 98% 94%    Intake/Output Summary (Last 24 hours) at 08/09/2022 1013 Last data filed at 08/09/2022 6962 Gross per 24 hour  Intake --  Output 450 ml  Net -450 ml    There were no vitals filed for this visit.  Examination:  General exam: Looks comfortable.  Flat affect.  Not very interactive.  Follows simple commands but does not keep up conversation.  Respiratory system: Clear to auscultation. Respiratory effort normal. Cardiovascular system: S1 & S2 heard, RRR. No pedal edema. Gastrointestinal system: Soft.  Nontender.  Bowel sound present. Central nervous system: Alert and oriented. No focal neurological deficits.  Globally weak. 2 staples intact, nontender clean and dry on the posterior scalp.    Data Reviewed: I have personally reviewed following labs and imaging studies  CBC: Recent Labs  Lab 08/03/22 1557 08/03/22 1613 08/04/22 0826  WBC 4.6  --  5.5  NEUTROABS 3.3  --   --   HGB 13.5 13.9  12.3  HCT 42.0 41.0 35.5*  MCV 83.7  --  80.5  PLT 44*  --  38*   Basic Metabolic Panel: Recent Labs  Lab 08/03/22 1557 08/03/22 1613 08/04/22 0826 08/05/22 0430 08/07/22 0223  NA 138 139 135  --  137  K 3.3* 3.6 2.8* 2.9* 3.7  CL 100 102 102  --  104  CO2 19*  --  18*  --  27  GLUCOSE 49* 46* 75  --  102*  BUN 23* 25*  11  --  13  CREATININE 0.86 0.60 0.89  --  0.61  CALCIUM 9.6  --  8.5*  --  8.9  MG  --   --  1.6* 1.9 1.7  PHOS  --   --   --  1.7* 3.3   GFR: CrCl cannot be calculated (Unknown ideal weight.). Liver Function Tests: Recent Labs  Lab 08/03/22 1557 08/04/22 0826  AST 50* 38  ALT 27 23  ALKPHOS 91 75  BILITOT 1.7* 1.2  PROT 7.5 6.4*  ALBUMIN 4.4 3.6   No results for input(s): "LIPASE", "AMYLASE" in the last 168 hours. No results for input(s): "AMMONIA" in the last 168 hours. Coagulation Profile: No results for input(s): "INR", "PROTIME" in the last 168 hours. Cardiac Enzymes: Recent Labs  Lab 08/03/22 1557  CKTOTAL 416*   BNP (last 3 results) No results for input(s): "PROBNP" in the last 8760 hours. HbA1C: No results for input(s): "HGBA1C" in the last 72 hours. CBG: Recent Labs  Lab 08/03/22 2052 08/03/22 2117 08/03/22 2224  GLUCAP 45* 70 165*   Lipid Profile: No results for input(s): "CHOL", "HDL", "LDLCALC", "TRIG", "CHOLHDL", "LDLDIRECT" in the last 72 hours. Thyroid Function Tests: No results for input(s): "TSH", "T4TOTAL", "FREET4", "T3FREE", "THYROIDAB" in the last 72 hours. Anemia Panel: No results for input(s): "VITAMINB12", "FOLATE", "FERRITIN", "TIBC", "IRON", "RETICCTPCT" in the last 72 hours. Sepsis Labs: No results for input(s): "PROCALCITON", "LATICACIDVEN" in the last 168 hours.  No results found for this or any previous visit (from the past 240 hour(s)).       Radiology Studies: No results found.      Scheduled Meds:  famotidine  20 mg Oral Daily   linaclotide  290 mcg Oral QAC breakfast   risperiDONE  0.5 mg Oral BID   rosuvastatin  40 mg Oral Daily   Continuous Infusions:     LOS: 5 days    Time spent: 35 minutes    Barb Merino, MD [facility] Pager [phone]

## 2022-08-09 NOTE — Progress Notes (Addendum)
1:15pm: CSW received return call from [place] who states she has selected Emergency planning/management officer for [facility].   CSW spoke with Whitney at central intake for the facility to request she initiate insurance authorization.  10am: CSW spoke with patient's legal guardian Vincente Liberty to present her with bed offers from [facility] and [facility]. Vincente Liberty states she will do her own independent research and return call to [place] with a a decision so that insurance authorization can be submitted.  Madilyn Fireman, MSW, LCSW Transitions of Care  Clinical Social Worker II [phone]

## 2022-08-10 ENCOUNTER — Inpatient Hospital Stay (HOSPITAL_COMMUNITY): Payer: Medicare Other

## 2022-08-10 DIAGNOSIS — E876 Hypokalemia: Secondary | ICD-10-CM | POA: Diagnosis not present

## 2022-08-10 DIAGNOSIS — N3000 Acute cystitis without hematuria: Secondary | ICD-10-CM | POA: Diagnosis not present

## 2022-08-10 DIAGNOSIS — M25571 Pain in right ankle and joints of right foot: Secondary | ICD-10-CM | POA: Diagnosis not present

## 2022-08-10 NOTE — Progress Notes (Signed)
Occupational Therapy Treatment Patient Details Name: Kaitlyn Duncan MRN: [id number] DOB: 06-21-1971 Today's Date: 08/10/2022   History of present illness 52 y.o. female admitted 1/30 with fall and head lac. PMhx: melanoma with brain mets s/p chemo and whole brain radiation, anemia of chronic disease, schizophrenia, chronic thrombocytopenia, GERD, CVA, multiple recent falls with ED visits.   OT comments  Pt with flat affect, impaired ability to sit up in bed with HOB elevated and continues to have decreased awareness of her deficits. Pt declined sitting up at EOB and transfers but was agreeable to ADL's at bed level. She is currently Min-mod A for UB ADL's (Max-total assist LB ADL's) with increased time secondary to significant time required for all tasks and initiation of movement.  SNF rehab is still appropriate. OT goals downgraded.    Recommendations for follow up therapy are one component of a multi-disciplinary discharge planning process, led by the attending physician.  Recommendations may be updated based on patient status, additional functional criteria and insurance authorization.    Follow Up Recommendations  Skilled nursing-short term rehab (<3 hours/day)     Assistance Recommended at Discharge Frequent or constant Supervision/Assistance  Patient can return home with the following  A lot of help with walking and/or transfers;A lot of help with bathing/dressing/bathroom;Assistance with cooking/housework;Direct supervision/assist for medications management;Direct supervision/assist for financial management;Assist for transportation;Help with stairs or ramp for entrance   Equipment Recommendations  Other (comment) (Defer to next venue)    Recommendations for Other Services      Precautions / Restrictions Precautions Precautions: Fall Precaution Comments: incontinent       Mobility Bed Mobility Overal bed mobility: Needs Assistance Bed Mobility: Supine to Sit      Supine to sit: Max assist, HOB elevated    Transfers Overall transfer level:  (Pt declined OOB at this time - seen at bed level)           ADL either performed or assessed with clinical judgement   ADL Overall ADL's : Needs assistance/impaired   Grooming: Wash/dry hands;Wash/dry face;Oral care;Bed level;Minimal assistance Grooming Details (indicate cue type and reason): Significant time for all tasks and initiation of movement Upper Body Bathing: Moderate assistance;Bed level;Set up Upper Body Bathing Details (indicate cue type and reason): Significant time for all tasks and initiation of movement, decreased processing, flat affect Lower Body Bathing: Maximal assistance;Bed level   Upper Body Dressing : Bed level;Minimal assistance Upper Body Dressing Details (indicate cue type and reason): Changing gown, increased time and min assist to doff and then don clean gown Lower Body Dressing: Total assistance;Bed level Lower Body Dressing Details (indicate cue type and reason): Don slipper socks after RN assessment.     General ADL Comments: Patient presenting with slowed processing for all ADLs, decreaesd task initiation. Will downgrade goals    Extremity/Trunk Assessment Upper Extremity Assessment Upper Extremity Assessment: Generalized weakness   Lower Extremity Assessment Lower Extremity Assessment: Defer to PT evaluation   Cervical / Trunk Assessment Cervical / Trunk Exceptions: keeping neck flexed forward seems slightly rigid with increased muscle tone    Vision Ability to See in Adequate Light: 0 Adequate Patient Visual Report: No change from baseline            Cognition Arousal/Alertness: Awake/alert Behavior During Therapy: Flat affect Overall Cognitive Status: Impaired/Different from baseline Area of Impairment: Problem solving, Following commands, Attention, Safety/judgement, Orientation, Memory   Orientation Level: Disoriented to, Time, Situation Current  Attention Level: Sustained Memory: Decreased short-term memory  Following Commands: Follows one step commands inconsistently, Follows one step commands with increased time Safety/Judgement: Decreased awareness of deficits, Decreased awareness of safety   Problem Solving: Slow processing, Decreased initiation, Requires verbal cues, Difficulty sequencing General Comments: Pt with extremely slow movements, little to no apparent initiation for bed mobility despite max cues to sit up to assist in bathing/ADL's              General Comments  Slow processing and decreased awareness of deficits, decreased task initiation    Pertinent Vitals/ Pain       Pain Assessment Pain Assessment: Faces Faces Pain Scale: Hurts little more Pain Location: Stomach and R ankle with movement in bed and palpation by RN Pain Descriptors / Indicators: Aching, Grimacing, Discomfort Pain Intervention(s): Limited activity within patient's tolerance, Monitored during session, Repositioned  Home Living  Please refer to initial OT assessment for details of PLOF.     Prior Functioning/Environment   Pt reports Mod I for all ADL's and self care tasks. States that she stood in shower for bathing   Frequency  Min 2X/week        Progress Toward Goals  OT Goals(current goals can now be found in the care plan section)  Progress towards OT goals: Goals drowngraded-see care plan  Acute Rehab OT Goals Patient Stated Goal: None stated OT Goal Formulation: Patient unable to participate in goal setting Time For Goal Achievement: 08/18/22 Potential to Achieve Goals: [place] Discharge plan remains appropriate       AM-PAC OT "6 Clicks" Daily Activity     Outcome Measure   Help from another person eating meals?: A Little Help from another person taking care of personal grooming?: A Little Help from another person toileting, which includes using toliet, bedpan, or urinal?: Total Help from another person bathing  (including washing, rinsing, drying)?: A Lot Help from another person to put on and taking off regular upper body clothing?: A Little Help from another person to put on and taking off regular lower body clothing?: Total 6 Click Score: 13    End of Session  In bed with call bell and phone in reach  OT Visit Diagnosis: Unsteadiness on feet (R26.81);Other abnormalities of gait and mobility (R26.89);Repeated falls (R29.6);Muscle weakness (generalized) (M62.81);History of falling (Z91.81);Other symptoms and signs involving cognitive function;Pain Pain - part of body:  (Stomach and right ankle with RN assessment)   Activity Tolerance Patient limited by lethargy;Patient limited by fatigue   Patient Left in bed;with call bell/phone within reach;with bed alarm set   Nurse Communication Other (comment) (UB ADL's completed at bed level, decreaed initiation, slow processing)        Time: 9169-4503 OT Time Calculation (min): 36 min  Charges: OT General Charges $OT Visit: 1 Visit OT Treatments $Self Care/Home Management : 23-37 mins   Alysse Rathe Beth Dixon, OTR/L 08/10/2022, 9:05 AM

## 2022-08-10 NOTE — Progress Notes (Signed)
PROGRESS NOTE    Kaitlyn Duncan  DJS:[id number] DOB: 02/28/1971 DOA: 08/03/2022 PCP: Hoyt Koch, MD    Brief Narrative:  52 year old female with history of melanoma with brain mets status post chemotherapy and whole brain radiation, anemia of chronic disease, chronic thrombocytopenia, GERD and history of stroke presented from hotel room where she fell while she was trying to reach her laundry basket.  Reported mechanical fall.  Denied any dizziness lightheadedness.  In the emergency room skin survey was negative.  She had a scalp repaired with 2 staples.  CT head and MRI with old stroke no acute findings.  Admitted due to recurrent fall and unsafe discharge.  Urinalysis was abnormal.  Blood sugars were 49. Waiting for skilled nursing facility placement.  Remained very apathetic, psychiatry was consulted and Risperdal was decreased to half. Waiting for SNF.   Assessment & Plan:   Physical deconditioning and debility, recurrent falls: No evidence of skeletal injury.  Has a history of stroke.  No acute neurological deficits but globally weak.   Needs a skilled nursing facility placement. She had a scalp laceration that was repaired.  Will remove the staples before discharge.  Presumptive UTI: Urinalysis was abnormal.  Unfortunately urine culture was not sent.  Completed 3 days of Rocephin.  Hypoglycemia secondary to inadequate oral intake, moderate clinical dehydration, high anion gap metabolic acidosis: Blood sugars improved after regular intake.  Patient probably does not have adequate access to meal.  Social worker to evaluate. Encourage oral intake.  Blood sugars remained stable.  Electrolytes: Replaced aggressively.  Adequate.  Schizophrenia: Take risperidone.  Decreased to half doses with psych recommendation.  Hyperlipidemia: Continue statin.  Medically stable to transfer to SNF whenever bed available.   DVT prophylaxis: SCDs Start: 08/03/22 2341   Code Status:  Full code Family Communication: None at bedside.  She has a state appointed guardian. Disposition Plan: Status is: Inpatient.  Not safe to discharge to hotel.   Consultants:  None  Procedures:  None  Antimicrobials:  Rocephin 1/30---   Subjective:  Patient seen and examined.  Alert awake, flat affect.  Denies any complaints.  Objective: Vitals:   08/09/22 1605 08/09/22 2022 08/10/22 0459 08/10/22 0756  BP: 132/80 125/88 103/70 123/80  Pulse: 100 97 85 85  Resp: '16 20 18 18  '$ Temp: (!) 97.4 F (36.3 C) 97.7 F (36.5 C) 97.6 F (36.4 C) 97.9 F (36.6 C)  TempSrc: Oral Oral Oral Oral  SpO2: 100% 98% 97% 97%    Intake/Output Summary (Last 24 hours) at 08/10/2022 1020 Last data filed at 08/10/2022 0817 Gross per 24 hour  Intake 120 ml  Output 800 ml  Net -680 ml     There were no vitals filed for this visit.  Examination:  General exam: Comfortable.  Flat affect.  Not very interactive but answers basic questions.  Respiratory system: Clear to auscultation. Respiratory effort normal. Cardiovascular system: S1 & S2 heard, RRR. No pedal edema. Gastrointestinal system: Soft.  Nontender.  Bowel sound present. Central nervous system: Alert and oriented. No focal neurological deficits.  Globally weak.     Data Reviewed: I have personally reviewed following labs and imaging studies  CBC: Recent Labs  Lab 08/03/22 1557 08/03/22 1613 08/04/22 0826  WBC 4.6  --  5.5  NEUTROABS 3.3  --   --   HGB 13.5 13.9 12.3  HCT 42.0 41.0 35.5*  MCV 83.7  --  80.5  PLT 44*  --  38*  Basic Metabolic Panel: Recent Labs  Lab 08/03/22 1557 08/03/22 1613 08/04/22 0826 08/05/22 0430 08/07/22 0223  NA 138 139 135  --  137  K 3.3* 3.6 2.8* 2.9* 3.7  CL 100 102 102  --  104  CO2 19*  --  18*  --  27  GLUCOSE 49* 46* 75  --  102*  BUN 23* 25* 11  --  13  CREATININE 0.86 0.60 0.89  --  0.61  CALCIUM 9.6  --  8.5*  --  8.9  MG  --   --  1.6* 1.9 1.7  PHOS  --   --   --   1.7* 3.3    GFR: CrCl cannot be calculated (Unknown ideal weight.). Liver Function Tests: Recent Labs  Lab 08/03/22 1557 08/04/22 0826  AST 50* 38  ALT 27 23  ALKPHOS 91 75  BILITOT 1.7* 1.2  PROT 7.5 6.4*  ALBUMIN 4.4 3.6    No results for input(s): "LIPASE", "AMYLASE" in the last 168 hours. No results for input(s): "AMMONIA" in the last 168 hours. Coagulation Profile: No results for input(s): "INR", "PROTIME" in the last 168 hours. Cardiac Enzymes: Recent Labs  Lab 08/03/22 1557  CKTOTAL 416*    BNP (last 3 results) No results for input(s): "PROBNP" in the last 8760 hours. HbA1C: No results for input(s): "HGBA1C" in the last 72 hours. CBG: Recent Labs  Lab 08/03/22 2052 08/03/22 2117 08/03/22 2224  GLUCAP 45* 70 165*    Lipid Profile: No results for input(s): "CHOL", "HDL", "LDLCALC", "TRIG", "CHOLHDL", "LDLDIRECT" in the last 72 hours. Thyroid Function Tests: No results for input(s): "TSH", "T4TOTAL", "FREET4", "T3FREE", "THYROIDAB" in the last 72 hours. Anemia Panel: No results for input(s): "VITAMINB12", "FOLATE", "FERRITIN", "TIBC", "IRON", "RETICCTPCT" in the last 72 hours. Sepsis Labs: No results for input(s): "PROCALCITON", "LATICACIDVEN" in the last 168 hours.  No results found for this or any previous visit (from the past 240 hour(s)).       Radiology Studies: No results found.      Scheduled Meds:  famotidine  20 mg Oral Daily   linaclotide  290 mcg Oral QAC breakfast   risperiDONE  0.5 mg Oral BID   rosuvastatin  40 mg Oral Daily   Continuous Infusions:     LOS: 6 days    Time spent: 25 minutes    Barb Merino, MD [facility] Pager [phone]

## 2022-08-10 NOTE — Progress Notes (Signed)
Mobility Specialist - Progress Note   08/10/22 1430  Mobility  Activity Transferred from bed to chair  Level of Assistance +2 (takes two people)  Assistive Device Front wheel walker  Activity Response Tolerated fair  Mobility Referral Yes  $Mobility charge 1 Mobility   Pt received in bed and agreeable to get in chair. Pt was ModA +2 to stand and pivot to chair. Pt was left in chair with all needs met and chair alarm on.   Franki Monte  Mobility Specialist Please contact via Solicitor or Rehab office at [phone]

## 2022-08-10 NOTE — Care Management Important Message (Signed)
Important Message  Patient Details  Name: Kaitlyn Duncan MRN: [id number] Date of Birth: June 28, 1971   Medicare Important Message Given:  Yes     Stayce Delancy 08/10/2022, 9:30 AM

## 2022-08-10 NOTE — Evaluation (Signed)
Clinical/Bedside Swallow Evaluation Patient Details  Name: Kaitlyn Duncan MRN: [id number] Date of Birth: [birth year]  Today's Date: 08/10/2022 Time: SLP Start Time (ACUTE ONLY): 1532 SLP Stop Time (ACUTE ONLY): 4098 SLP Time Calculation (min) (ACUTE ONLY): 18 min  Past Medical History:  Past Medical History:  Diagnosis Date   Allergy    Anemia    Gallstones    GERD (gastroesophageal reflux disease)    Hemorrhoids    melanoma with met dz dx'd 01/2018   brain and adrenal   Seasonal allergies    Stroke [facility])    Past Surgical History:  Past Surgical History:  Procedure Laterality Date   BIOPSY  01/28/2018   Procedure: BIOPSY;  Surgeon: Laurence Spates, MD;  Location: [facility] ENDOSCOPY;  Service: Endoscopy;;   BREAST BIOPSY Left    ESOPHAGOGASTRODUODENOSCOPY N/A 01/28/2018   Procedure: ESOPHAGOGASTRODUODENOSCOPY (EGD);  Surgeon: Laurence Spates, MD;  Location: [facility];  Service: Endoscopy;  Laterality: N/A;   MOUTH SURGERY     OVARY SURGERY     removed "something"   HPI:  Pt is a 52 yo female admitted 1/30 with fall and head lac. PMH includes: melanoma with brain mets s/p chemo and whole brain radiation, anemia of chronic disease, schizophrenia, chronic thrombocytopenia, GERD, CVA, and multiple recent falls with ED visits.    Assessment / Plan / Recommendation  Clinical Impression  Pt was seated upright in chair and eating meal tray upon SLP arrival. Per MD, there are concerns with pt holding POs in oral cavity; however, pt noted no difficulties with eating or swallowing. Oral motor exam functional, but note generalized weakness. Pt observed with trials of thin liquids and solids. No overt s/s of dysphagia or aspiration noted with POs. Pt required assistance with cutting large pieces of chicken into bite-sized pieces, but independently used a slow rate to ensure solids were chewed thoroughly. She may benefit from assistance with set up to ensure solids are cut into smaller pieces due  to pt's apparent overall motor weakness. Recommend continuing regular diet with thin liquids. SLP Visit Diagnosis: Dysphagia, unspecified (R13.10)    Aspiration Risk  No limitations    Diet Recommendation Regular;Thin liquid   Liquid Administration via: Straw;Cup Medication Administration: Whole meds with liquid Supervision: Patient able to self feed Compensations: Minimize environmental distractions;Slow rate;Small sips/bites Postural Changes: Seated upright at 90 degrees    Other  Recommendations Oral Care Recommendations: Oral care BID    Recommendations for follow up therapy are one component of a multi-disciplinary discharge planning process, led by the attending physician.  Recommendations may be updated based on patient status, additional functional criteria and insurance authorization.  Follow up Recommendations No SLP follow up      Assistance Recommended at Discharge    Functional Status Assessment Patient has not had a recent decline in their functional status  Frequency and Duration            Prognosis        Swallow Study   General HPI: Pt is a 52 yo female admitted 1/30 with fall and head lac. PMH includes: melanoma with brain mets s/p chemo and whole brain radiation, anemia of chronic disease, schizophrenia, chronic thrombocytopenia, GERD, CVA, and multiple recent falls with ED visits. Type of Study: Bedside Swallow Evaluation Previous Swallow Assessment: none in chart Diet Prior to this Study: Regular;Thin liquids (Level 0) Temperature Spikes Noted: No Respiratory Status: Room air History of Recent Intubation: No Behavior/Cognition: Cooperative;Alert Oral Cavity Assessment: Within Functional Limits Oral  Care Completed by SLP: No Oral Cavity - Dentition: Adequate natural dentition Vision: Functional for self-feeding Self-Feeding Abilities: Able to feed self Patient Positioning: Upright in chair Baseline Vocal Quality: Normal Volitional Cough:  Strong Volitional Swallow: Able to elicit    Oral/Motor/Sensory Function Overall Oral Motor/Sensory Function: Generalized oral weakness Facial ROM: Within Functional Limits Facial Symmetry: Within Functional Limits Facial Strength: Within Functional Limits Lingual ROM: Reduced right;Reduced left Lingual Symmetry: Within Functional Limits Lingual Strength: Reduced   Ice Chips Ice chips: Not tested   Thin Liquid Thin Liquid: Within functional limits Presentation: Straw    Nectar Thick Nectar Thick Liquid: Not tested   Honey Thick Honey Thick Liquid: Not tested   Puree Puree: Not tested   Solid     Solid: Within functional limits Presentation: [place] M., Student SLP  08/10/2022,4:10 PM

## 2022-08-11 DIAGNOSIS — E785 Hyperlipidemia, unspecified: Secondary | ICD-10-CM | POA: Diagnosis not present

## 2022-08-11 DIAGNOSIS — R488 Other symbolic dysfunctions: Secondary | ICD-10-CM | POA: Diagnosis not present

## 2022-08-11 DIAGNOSIS — R278 Other lack of coordination: Secondary | ICD-10-CM | POA: Diagnosis not present

## 2022-08-11 DIAGNOSIS — K219 Gastro-esophageal reflux disease without esophagitis: Secondary | ICD-10-CM | POA: Diagnosis not present

## 2022-08-11 DIAGNOSIS — R531 Weakness: Secondary | ICD-10-CM | POA: Diagnosis not present

## 2022-08-11 DIAGNOSIS — I679 Cerebrovascular disease, unspecified: Secondary | ICD-10-CM | POA: Diagnosis not present

## 2022-08-11 DIAGNOSIS — D638 Anemia in other chronic diseases classified elsewhere: Secondary | ICD-10-CM | POA: Diagnosis not present

## 2022-08-11 DIAGNOSIS — N390 Urinary tract infection, site not specified: Secondary | ICD-10-CM

## 2022-08-11 DIAGNOSIS — F209 Schizophrenia, unspecified: Secondary | ICD-10-CM | POA: Diagnosis not present

## 2022-08-11 DIAGNOSIS — K5900 Constipation, unspecified: Secondary | ICD-10-CM | POA: Diagnosis not present

## 2022-08-11 DIAGNOSIS — W19XXXA Unspecified fall, initial encounter: Secondary | ICD-10-CM | POA: Diagnosis not present

## 2022-08-11 DIAGNOSIS — Z7401 Bed confinement status: Secondary | ICD-10-CM | POA: Diagnosis not present

## 2022-08-11 DIAGNOSIS — Z85820 Personal history of malignant melanoma of skin: Secondary | ICD-10-CM | POA: Diagnosis not present

## 2022-08-11 DIAGNOSIS — M6281 Muscle weakness (generalized): Secondary | ICD-10-CM | POA: Diagnosis not present

## 2022-08-11 DIAGNOSIS — C7931 Secondary malignant neoplasm of brain: Secondary | ICD-10-CM | POA: Diagnosis not present

## 2022-08-11 DIAGNOSIS — R296 Repeated falls: Secondary | ICD-10-CM | POA: Diagnosis not present

## 2022-08-11 DIAGNOSIS — D696 Thrombocytopenia, unspecified: Secondary | ICD-10-CM | POA: Diagnosis not present

## 2022-08-11 NOTE — Progress Notes (Signed)
Mobility Specialist - Progress Note   08/11/22 1125  Mobility  Activity Refused mobility   Pt refused mobility stating that she was tired and wanted to sleep. Will follow up as time permits.  Kaitlyn Duncan  Mobility Specialist Please contact via Solicitor or Rehab office at [phone]

## 2022-08-11 NOTE — Plan of Care (Signed)
0800 Patient AxOx4, delayed responses. Denies pain, n/v, n/t, VS WNL. Rhonchi noted on auscultation. HR regular. Patient is more active and cooperative today compared with yesterday. She has 3 small stage pressure ulcers on buttocks covered with mepilex and a scab in the occipital area, open to air. Patient will be helped to stay in the chair during the day.

## 2022-08-11 NOTE — Progress Notes (Signed)
PROGRESS NOTE    Kaitlyn Duncan  XBD:[id number] DOB: 02-10-1971 DOA: 08/03/2022 PCP: Hoyt Koch, MD   Brief Narrative:  52 year old female with history of melanoma with brain mets status post chemotherapy and whole brain radiation, anemia of chronic disease, chronic thrombocytopenia, GERD and history of stroke presented from hotel after a fall.  On presentation, she had a scalp laceration which was repaired with staples.  CT and MRI of the brain showed old stroke with no acute findings.  She was admitted due to recurrent falls and unsafe discharge.  She was started on IV Rocephin for presumed UTI.  Had low blood sugars initially.  Psychiatry consulted as well because of patient being very apathetic: Risperdal dose was decreased to half.  PT recommended SNF placement.  TOC following.  Currently medically stable for SNF.  Assessment & Plan:   Physical deconditioning and debility, recurrent falls: No evidence of skeletal injury.  Has a history of stroke.  No acute neurological deficits but globally weak.   Needs a skilled nursing facility placement. She had a scalp laceration that was repaired.  Will remove the staples before discharge.   Presumptive UTI: Urinalysis was abnormal.  Unfortunately urine culture was not sent.  Completed 3 days of Rocephin.   Hypoglycemia secondary to inadequate oral intake, moderate clinical dehydration, high anion gap metabolic acidosis: Blood sugars improved after regular intake.  Patient probably does not have adequate access to meal.  Encourage oral intake.  Blood sugars remained stable.   Hypokalemia Hypomagnesemia Hypophosphatemia -Replaced and improved.  No recent labs.   Schizophrenia: Continue Risperdal: Dose decreased to half by psychiatry.  Outpatient follow-up with psychiatry.   Hyperlipidemia: Continue statin.    DVT prophylaxis: SCDs Code Status: Full Family Communication: None at bedside Disposition Plan: Status is:  Inpatient Remains inpatient appropriate because: Of need for SNF placement.  Currently medically stable for discharge  Consultants: Psychiatry  Procedures: None  Antimicrobials: Rocephin from 08/03/2022-08/06/22   Subjective: Patient seen and examined at bedside.  Very poor historian, extremely slow to respond.  No fever, seizures, vomiting reported.  Objective: Vitals:   08/10/22 0756 08/10/22 1607 08/10/22 1903 08/11/22 0429  BP: 123/80 129/85 128/88 118/84  Pulse: 85 99 89 90  Resp: '18 18 18 18  '$ Temp: 97.9 F (36.6 C) 98.4 F (36.9 C) (!) 97.5 F (36.4 C) 98.1 F (36.7 C)  TempSrc: Oral Oral Oral Oral  SpO2: 97% 97% 100% 99%    Intake/Output Summary (Last 24 hours) at 08/11/2022 0900 Last data filed at 08/10/2022 1300 Gross per 24 hour  Intake 200 ml  Output --  Net 200 ml   There were no vitals filed for this visit.  Examination:  General exam: Appears calm and comfortable.  Extremely slow to respond.  Extremely flat affect and hardly participates in any conversation.  Poor historian.  No distress. Respiratory system: Bilateral decreased breath sounds at bases, no wheezing Cardiovascular system: S1 & S2 heard, Rate controlled Gastrointestinal system: Abdomen is nondistended, soft and nontender. Normal bowel sounds heard. Extremities: No cyanosis, clubbing, edema    Data Reviewed: I have personally reviewed following labs and imaging studies  CBC: No results for input(s): "WBC", "NEUTROABS", "HGB", "HCT", "MCV", "PLT" in the last 168 hours. Basic Metabolic Panel: Recent Labs  Lab 08/05/22 0430 08/07/22 0223  NA  --  137  K 2.9* 3.7  CL  --  104  CO2  --  27  GLUCOSE  --  102*  BUN  --  13  CREATININE  --  0.61  CALCIUM  --  8.9  MG 1.9 1.7  PHOS 1.7* 3.3   GFR: CrCl cannot be calculated (Unknown ideal weight.). Liver Function Tests: No results for input(s): "AST", "ALT", "ALKPHOS", "BILITOT", "PROT", "ALBUMIN" in the last 168 hours. No results for  input(s): "LIPASE", "AMYLASE" in the last 168 hours. No results for input(s): "AMMONIA" in the last 168 hours. Coagulation Profile: No results for input(s): "INR", "PROTIME" in the last 168 hours. Cardiac Enzymes: No results for input(s): "CKTOTAL", "CKMB", "CKMBINDEX", "TROPONINI" in the last 168 hours. BNP (last 3 results) No results for input(s): "PROBNP" in the last 8760 hours. HbA1C: No results for input(s): "HGBA1C" in the last 72 hours. CBG: No results for input(s): "GLUCAP" in the last 168 hours. Lipid Profile: No results for input(s): "CHOL", "HDL", "LDLCALC", "TRIG", "CHOLHDL", "LDLDIRECT" in the last 72 hours. Thyroid Function Tests: No results for input(s): "TSH", "T4TOTAL", "FREET4", "T3FREE", "THYROIDAB" in the last 72 hours. Anemia Panel: No results for input(s): "VITAMINB12", "FOLATE", "FERRITIN", "TIBC", "IRON", "RETICCTPCT" in the last 72 hours. Sepsis Labs: No results for input(s): "PROCALCITON", "LATICACIDVEN" in the last 168 hours.  No results found for this or any previous visit (from the past 240 hour(s)).       Radiology Studies: DG Ankle Complete Right  Result Date: 08/10/2022 CLINICAL DATA:  Acute right ankle pain. EXAM: RIGHT ANKLE - COMPLETE 3+ VIEW COMPARISON:  05/25/2022 FINDINGS: No fracture or dislocation. Joint spaces are preserved. The ankle mortise is preserved given obliquity. No ankle joint effusion. Redemonstrated bone island involving the anterior aspect of the talus. 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 No significant plantar calcaneal spur. Minimal enthesopathic change involving the Achilles tendon insertion site. Regional soft tissues appear normal. IMPRESSION: No explanation for patient's acute right ankle pain. Electronically Signed   By: Sandi Mariscal M.D.   On: 08/10/2022 14:48        Scheduled Meds:  famotidine  20 mg Oral Daily   linaclotide  290 mcg Oral QAC breakfast   risperiDONE  0.5 mg Oral BID    rosuvastatin  40 mg Oral Daily   Continuous Infusions:        Aline August, MD [facility] 08/11/2022, 9:00 AM

## 2022-08-11 NOTE — Progress Notes (Addendum)
9:50am: CSW spoke with Whitney at [employer] who states patient's insurance authorization is still pending.  8:30am: CSW spoke with CMA to request she contact [employer] to determine the status of the authorization request. CMA will contact [employer] and update CSW with information.  Madilyn Fireman, MSW, LCSW Transitions of Care  Clinical Social Worker II [phone]

## 2022-08-12 DIAGNOSIS — R5381 Other malaise: Secondary | ICD-10-CM | POA: Diagnosis not present

## 2022-08-12 DIAGNOSIS — K219 Gastro-esophageal reflux disease without esophagitis: Secondary | ICD-10-CM | POA: Diagnosis not present

## 2022-08-12 DIAGNOSIS — D649 Anemia, unspecified: Secondary | ICD-10-CM | POA: Diagnosis not present

## 2022-08-12 DIAGNOSIS — N390 Urinary tract infection, site not specified: Secondary | ICD-10-CM | POA: Diagnosis not present

## 2022-08-12 DIAGNOSIS — M549 Dorsalgia, unspecified: Secondary | ICD-10-CM | POA: Diagnosis not present

## 2022-08-12 DIAGNOSIS — D638 Anemia in other chronic diseases classified elsewhere: Secondary | ICD-10-CM | POA: Diagnosis not present

## 2022-08-12 DIAGNOSIS — M6281 Muscle weakness (generalized): Secondary | ICD-10-CM | POA: Diagnosis not present

## 2022-08-12 DIAGNOSIS — E785 Hyperlipidemia, unspecified: Secondary | ICD-10-CM | POA: Diagnosis not present

## 2022-08-12 DIAGNOSIS — E118 Type 2 diabetes mellitus with unspecified complications: Secondary | ICD-10-CM | POA: Diagnosis not present

## 2022-08-12 DIAGNOSIS — D696 Thrombocytopenia, unspecified: Secondary | ICD-10-CM | POA: Diagnosis not present

## 2022-08-12 DIAGNOSIS — I679 Cerebrovascular disease, unspecified: Secondary | ICD-10-CM | POA: Diagnosis not present

## 2022-08-12 DIAGNOSIS — C7931 Secondary malignant neoplasm of brain: Secondary | ICD-10-CM | POA: Diagnosis not present

## 2022-08-12 DIAGNOSIS — Z7401 Bed confinement status: Secondary | ICD-10-CM | POA: Diagnosis not present

## 2022-08-12 DIAGNOSIS — R296 Repeated falls: Secondary | ICD-10-CM | POA: Diagnosis not present

## 2022-08-12 DIAGNOSIS — W19XXXA Unspecified fall, initial encounter: Secondary | ICD-10-CM | POA: Diagnosis not present

## 2022-08-12 DIAGNOSIS — R488 Other symbolic dysfunctions: Secondary | ICD-10-CM | POA: Diagnosis not present

## 2022-08-12 DIAGNOSIS — R278 Other lack of coordination: Secondary | ICD-10-CM | POA: Diagnosis not present

## 2022-08-12 DIAGNOSIS — Z85820 Personal history of malignant melanoma of skin: Secondary | ICD-10-CM | POA: Diagnosis not present

## 2022-08-12 DIAGNOSIS — I10 Essential (primary) hypertension: Secondary | ICD-10-CM | POA: Diagnosis not present

## 2022-08-12 DIAGNOSIS — F419 Anxiety disorder, unspecified: Secondary | ICD-10-CM | POA: Diagnosis not present

## 2022-08-12 DIAGNOSIS — F2089 Other schizophrenia: Secondary | ICD-10-CM | POA: Diagnosis not present

## 2022-08-12 DIAGNOSIS — R531 Weakness: Secondary | ICD-10-CM | POA: Diagnosis not present

## 2022-08-12 DIAGNOSIS — K5900 Constipation, unspecified: Secondary | ICD-10-CM | POA: Diagnosis not present

## 2022-08-12 DIAGNOSIS — F209 Schizophrenia, unspecified: Secondary | ICD-10-CM | POA: Diagnosis not present

## 2022-08-12 DIAGNOSIS — Z7189 Other specified counseling: Secondary | ICD-10-CM | POA: Diagnosis not present

## 2022-08-12 DIAGNOSIS — I639 Cerebral infarction, unspecified: Secondary | ICD-10-CM | POA: Diagnosis not present

## 2022-08-12 MED ORDER — RISPERIDONE 1 MG PO TABS: 0.5000 mg | ORAL_TABLET | Freq: Two times a day (BID) | ORAL | Status: DC

## 2022-08-12 NOTE — Progress Notes (Signed)
CSW spoke with Whitney at [facility] who states the patient can be accepted into the facility today.  CSW spoke with patient's legal guardian [name] to inform her of information.  Patient will go to [facility] via [facility] - RN to call [facility] and report when ready. The number to call for report [phone].  Madilyn Fireman, MSW, LCSW Transitions of Care  Clinical Social Worker II [phone]

## 2022-08-12 NOTE — Discharge Summary (Signed)
Physician Discharge Summary  Kaitlyn Duncan YWV:[id number] DOB: 1971/04/08 DOA: 08/03/2022  PCP: Hoyt Koch, MD  Admit date: 08/03/2022 Discharge date: 08/12/2022  Admitted From: Home Disposition: SNF  Recommendations for Outpatient Follow-up:  Follow up with SNF provider at earliest convenience Outpatient follow-up with psychiatry Follow up in ED if symptoms worsen or new appear   Home Health: No Equipment/Devices: None  Discharge Condition: Stable CODE STATUS: Full Diet recommendation: Regular Brief/Interim Summary: 52 year old female with history of melanoma with brain mets status post chemotherapy and whole brain radiation, anemia of chronic disease, chronic thrombocytopenia, GERD and history of stroke presented from hotel after a fall.  On presentation, she had a scalp laceration which was repaired with staples.  CT and MRI of the brain showed old stroke with no acute findings.  She was admitted due to recurrent falls and unsafe discharge.  She was started on IV Rocephin for presumed UTI.  Had low blood sugars initially.  Psychiatry consulted as well because of patient being very apathetic: Risperdal dose was decreased to half.  PT recommended SNF placement.  Currently medically stable for SNF.  She will be discharged to SNF once bed is available.  Discharge Diagnoses:   Physical deconditioning and debility, recurrent falls -No evidence of skeletal injury.  Has a history of stroke.  No acute neurological deficits but globally weak.   -Needs a skilled nursing facility placement. -She had a scalp laceration that was repaired.  Will remove the staples before discharge.   Presumptive UTI: Urinalysis was abnormal.  Unfortunately urine culture was not sent.  Completed 3 days of Rocephin.   Hypoglycemia secondary to inadequate oral intake, moderate clinical dehydration, high anion gap metabolic acidosis: Blood sugars improved after regular intake.  Patient probably does  not have adequate access to meal.  Encourage oral intake.  Blood sugars remained stable.   Hypokalemia Hypomagnesemia Hypophosphatemia -Replaced and improved.  No recent labs.   Schizophrenia: Continue Risperdal: Dose decreased to half by psychiatry.  Outpatient follow-up with psychiatry.    Hyperlipidemia: Continue statin.    Discharge Instructions  Discharge Instructions     Diet general   Complete by: As directed    Increase activity slowly   Complete by: As directed    No wound care   Complete by: As directed       Allergies as of 08/12/2022   No Known Allergies      Medication List     STOP taking these medications    fluPHENAZine decanoate 25 MG/ML injection Commonly known as: PROLIXIN   meloxicam 15 MG tablet Commonly known as: Mobic   omeprazole 20 MG capsule Commonly known as: PRILOSEC       TAKE these medications    linaclotide 290 MCG Caps capsule Commonly known as: Linzess Take 1 capsule (290 mcg total) by mouth daily before breakfast.   risperiDONE 1 MG tablet Commonly known as: RISPERDAL Take 0.5 tablets (0.5 mg total) by mouth 2 (two) times daily. What changed: how much to take   rosuvastatin 40 MG tablet Commonly known as: CRESTOR Take 1 tablet (40 mg total) by mouth daily.        Contact information for follow-up providers     [facility], [facility] Follow up.   Specialty: Home Health Services Why: [facility] will be providing home health services to include PT, OT and MSW.  They will call you in the next 24-48 hours at the hotel once you are discharged. Contact information: [address] [place] [address] [phone]         Psychiatrist. Schedule an appointment as soon as possible for a visit in 1 week(s).               Contact information for after-discharge care     Destination     [facility] Preferred SNF .   Service: Skilled Nursing Contact information: [place] [phone]                    No Known Allergies  Consultations: Psychiatry   Procedures/Studies: DG Ankle Complete Right  Result Date: 08/10/2022 CLINICAL DATA:  Acute right ankle pain. EXAM: RIGHT ANKLE - COMPLETE 3+ VIEW COMPARISON:  05/25/2022 FINDINGS: No fracture or dislocation. Joint spaces are preserved. The ankle mortise is preserved given obliquity. No ankle joint effusion. Redemonstrated bone island involving the anterior aspect of the talus. 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 No significant plantar calcaneal spur. Minimal enthesopathic change involving the Achilles tendon insertion site. Regional soft tissues appear normal. IMPRESSION: No explanation for patient's acute right ankle pain. Electronically Signed   By: Sandi Mariscal M.D.   On: 08/10/2022 14:48   MR Brain W and Wo Contrast  Result Date: 08/03/2022 CLINICAL DATA:  Fall EXAM: MRI HEAD WITHOUT AND WITH CONTRAST TECHNIQUE: Multiplanar, multiecho pulse sequences of the brain and surrounding structures were obtained without and with intravenous contrast. CONTRAST:  45m GADAVIST GADOBUTROL 1 MMOL/ML IV SOLN COMPARISON:  04/08/2022 FINDINGS: Brain: No acute infarct, mass effect or extra-axial collection. Fewer than 5 scattered microhemorrhages in a nonspecific pattern. There is confluent hyperintense T2-weighted signal within the white matter. Generalized volume loss. Old small vessel infarcts of the right cerebellum and left thalamus. The midline structures are normal. There is no abnormal contrast enhancement. Vascular: Normal flow voids. Skull and upper cervical spine: Right posterior scalp hematoma. Sinuses/Orbits: Atelectatic left maxillary sinus. No mastoid effusion. Normal orbits. Other: None. IMPRESSION: 1. No acute intracranial abnormality. 2. Old small vessel infarcts of the right cerebellum and left thalamus. 3. Confluent hyperintense T2-weighted  signal within the white matter, consistent with chronic microvascular ischemia. Electronically Signed   By: KUlyses JarredM.D.   On: 08/03/2022 20:16   CT Head Wo Contrast  Result Date: 08/03/2022 CLINICAL DATA:  Head trauma.  Moderate-severe. EXAM: CT HEAD WITHOUT CONTRAST TECHNIQUE: Contiguous axial images were obtained from the base of the skull through the vertex without intravenous contrast. RADIATION DOSE REDUCTION: This exam was performed according to the departmental dose-optimization program which includes automated exposure control, adjustment of the mA and/or kV according to patient size and/or use of iterative reconstruction technique. COMPARISON:  07/08/2022 FINDINGS: Brain: Newly seen low-density in the inferior cerebellum on the right concerning for an acute or subacute right cerebellar infarction. Old infarction of the left posterolateral thalamus which was acute in October of last year. Chronic small-vessel ischemic changes elsewhere throughout the cerebral hemispheric white matter. No mass lesion, hemorrhage, hydrocephalus or extra-axial fluid collection. Vascular: No abnormal vascular finding. Skull: Normal Sinuses/Orbits: Clear/normal Other: None IMPRESSION: 1. Newly seen low-density in the inferior cerebellum on the right concerning for an acute or subacute infarction. Consider MRI for further evaluation. 2. Old infarction of the left posterolateral thalamus which was acute in October of last year. Chronic small-vessel ischemic changes elsewhere throughout the cerebral hemispheric white matter. Electronically Signed   By: MJan FiremanD.  On: 08/03/2022 14:17      Subjective: Patient seen and examined at bedside.  Very poor historian, still extremely slow to respond.  No agitation, fever, seizures or vomiting reported.    Discharge Exam: Vitals:   08/12/22 0200 08/12/22 0719  BP: 94/64 105/77  Pulse: 78 82  Resp: 14 17  Temp: 98.3 F (36.8 C)   SpO2: 98% 99%    General  exam: No acute distress.  On room air.  Extremely slow to respond.  Extremely flat affect and hardly participates in any conversation.  Poor historian.   Respiratory system: Decreased breath sounds at bases bilaterally with scattered crackles  cardiovascular system: Rate mostly controlled; S1 and S2 are heard gastrointestinal system: Abdomen is distended slightly; soft and nontender.  Bowel sounds normal.   Extremities: No edema or clubbing    The results of significant diagnostics from this hospitalization (including imaging, microbiology, ancillary and laboratory) are listed below for reference.     Microbiology: No results found for this or any previous visit (from the past 240 hour(s)).   Labs: BNP (last 3 results) No results for input(s): "BNP" in the last 8760 hours. Basic Metabolic Panel: Recent Labs  Lab 08/07/22 0223  NA 137  K 3.7  CL 104  CO2 27  GLUCOSE 102*  BUN 13  CREATININE 0.61  CALCIUM 8.9  MG 1.7  PHOS 3.3   Liver Function Tests: No results for input(s): "AST", "ALT", "ALKPHOS", "BILITOT", "PROT", "ALBUMIN" in the last 168 hours. No results for input(s): "LIPASE", "AMYLASE" in the last 168 hours. No results for input(s): "AMMONIA" in the last 168 hours. CBC: No results for input(s): "WBC", "NEUTROABS", "HGB", "HCT", "MCV", "PLT" in the last 168 hours. Cardiac Enzymes: No results for input(s): "CKTOTAL", "CKMB", "CKMBINDEX", "TROPONINI" in the last 168 hours. BNP: Invalid input(s): "POCBNP" CBG: No results for input(s): "GLUCAP" in the last 168 hours. D-Dimer No results for input(s): "DDIMER" in the last 72 hours. Hgb A1c No results for input(s): "HGBA1C" in the last 72 hours. Lipid Profile No results for input(s): "CHOL", "HDL", "LDLCALC", "TRIG", "CHOLHDL", "LDLDIRECT" in the last 72 hours. Thyroid function studies No results for input(s): "TSH", "T4TOTAL", "T3FREE", "THYROIDAB" in the last 72 hours.  Invalid input(s): "FREET3" Anemia work  up No results for input(s): "VITAMINB12", "FOLATE", "FERRITIN", "TIBC", "IRON", "RETICCTPCT" in the last 72 hours. Urinalysis    Component Value Date/Time   COLORURINE YELLOW 08/04/2022 0336   APPEARANCEUR HAZY (A) 08/04/2022 0336   LABSPEC 1.025 08/04/2022 0336   PHURINE 6.0 08/04/2022 0336   GLUCOSEU NEGATIVE 08/04/2022 0336   HGBUR NEGATIVE 08/04/2022 0336   BILIRUBINUR NEGATIVE 08/04/2022 0336   BILIRUBINUR negative 02/24/2022 1402   KETONESUR 80 (A) 08/04/2022 0336   PROTEINUR 100 (A) 08/04/2022 0336   UROBILINOGEN 1.0 02/24/2022 1402   UROBILINOGEN 0.2 01/23/2021 1715   NITRITE NEGATIVE 08/04/2022 0336   LEUKOCYTESUR LARGE (A) 08/04/2022 0336   Sepsis Labs No results for input(s): "WBC" in the last 168 hours.  Invalid input(s): "PROCALCITONIN", "LACTICIDVEN" Microbiology No results found for this or any previous visit (from the past 240 hour(s)).   Time coordinating discharge: 35 minutes  SIGNED:   Aline August, MD  [facility] 08/12/2022, 9:05 AM

## 2022-08-12 NOTE — Plan of Care (Signed)
0900 Patient AxOx4 today, delayed responses, denies pain, N/V, SOB or pain. Patient informed that will be transferred to SNF today.   1100 I called report to [phone] and I talked with April Bryent, RN. Patient is on her way to the facility.

## 2022-08-13 DIAGNOSIS — C7931 Secondary malignant neoplasm of brain: Secondary | ICD-10-CM | POA: Diagnosis not present

## 2022-08-13 DIAGNOSIS — D696 Thrombocytopenia, unspecified: Secondary | ICD-10-CM | POA: Diagnosis not present

## 2022-08-13 DIAGNOSIS — Z7189 Other specified counseling: Secondary | ICD-10-CM | POA: Diagnosis not present

## 2022-08-13 DIAGNOSIS — D638 Anemia in other chronic diseases classified elsewhere: Secondary | ICD-10-CM | POA: Diagnosis not present

## 2022-08-18 DIAGNOSIS — K5900 Constipation, unspecified: Secondary | ICD-10-CM | POA: Diagnosis not present

## 2022-08-18 DIAGNOSIS — F209 Schizophrenia, unspecified: Secondary | ICD-10-CM | POA: Diagnosis not present

## 2022-08-18 DIAGNOSIS — R5381 Other malaise: Secondary | ICD-10-CM | POA: Diagnosis not present

## 2022-08-18 DIAGNOSIS — R296 Repeated falls: Secondary | ICD-10-CM | POA: Diagnosis not present

## 2022-08-23 ENCOUNTER — Telehealth: Payer: Self-pay

## 2022-08-23 DIAGNOSIS — E785 Hyperlipidemia, unspecified: Secondary | ICD-10-CM | POA: Diagnosis not present

## 2022-08-23 DIAGNOSIS — F209 Schizophrenia, unspecified: Secondary | ICD-10-CM | POA: Diagnosis not present

## 2022-08-23 NOTE — Progress Notes (Signed)
Called patient to schedule Medicare Annual Wellness Visit (AWV). Unable to reach patient.  Last date of AWV: No HX of AWV  Please schedule an appointment at any time with [facility].  If any questions, please contact me at [phone].  Thank you ,  P.J. Quentin Cornwall, CMA (AAMA)

## 2022-08-24 ENCOUNTER — Other Ambulatory Visit: Payer: Self-pay

## 2022-08-24 DIAGNOSIS — C439 Malignant melanoma of skin, unspecified: Secondary | ICD-10-CM

## 2022-08-25 DIAGNOSIS — E785 Hyperlipidemia, unspecified: Secondary | ICD-10-CM | POA: Diagnosis not present

## 2022-08-25 DIAGNOSIS — K5900 Constipation, unspecified: Secondary | ICD-10-CM | POA: Diagnosis not present

## 2022-08-25 DIAGNOSIS — D638 Anemia in other chronic diseases classified elsewhere: Secondary | ICD-10-CM | POA: Diagnosis not present

## 2022-08-26 DIAGNOSIS — M549 Dorsalgia, unspecified: Secondary | ICD-10-CM | POA: Diagnosis not present

## 2022-08-26 DIAGNOSIS — R296 Repeated falls: Secondary | ICD-10-CM | POA: Diagnosis not present

## 2022-09-02 DIAGNOSIS — I639 Cerebral infarction, unspecified: Secondary | ICD-10-CM | POA: Diagnosis not present

## 2022-09-02 DIAGNOSIS — R5381 Other malaise: Secondary | ICD-10-CM | POA: Diagnosis not present

## 2022-09-02 DIAGNOSIS — D638 Anemia in other chronic diseases classified elsewhere: Secondary | ICD-10-CM | POA: Diagnosis not present

## 2022-09-02 DIAGNOSIS — K219 Gastro-esophageal reflux disease without esophagitis: Secondary | ICD-10-CM | POA: Diagnosis not present

## 2022-09-14 DIAGNOSIS — D696 Thrombocytopenia, unspecified: Secondary | ICD-10-CM | POA: Diagnosis not present

## 2022-09-14 DIAGNOSIS — F209 Schizophrenia, unspecified: Secondary | ICD-10-CM | POA: Diagnosis not present

## 2022-09-14 DIAGNOSIS — F419 Anxiety disorder, unspecified: Secondary | ICD-10-CM | POA: Diagnosis not present

## 2022-09-14 DIAGNOSIS — F2089 Other schizophrenia: Secondary | ICD-10-CM | POA: Diagnosis not present

## 2022-09-14 DIAGNOSIS — E785 Hyperlipidemia, unspecified: Secondary | ICD-10-CM | POA: Diagnosis not present

## 2022-09-14 DIAGNOSIS — D649 Anemia, unspecified: Secondary | ICD-10-CM | POA: Diagnosis not present

## 2022-09-15 ENCOUNTER — Inpatient Hospital Stay: Payer: Medicare Other | Admitting: Internal Medicine

## 2022-09-15 ENCOUNTER — Inpatient Hospital Stay: Payer: Medicare Other | Attending: Family Medicine

## 2022-09-15 ENCOUNTER — Other Ambulatory Visit: Payer: Medicare Other

## 2022-09-15 ENCOUNTER — Ambulatory Visit: Payer: Medicare Other | Admitting: Oncology

## 2022-09-30 DIAGNOSIS — F209 Schizophrenia, unspecified: Secondary | ICD-10-CM | POA: Diagnosis not present

## 2022-10-07 DIAGNOSIS — U071 COVID-19: Secondary | ICD-10-CM | POA: Diagnosis not present

## 2022-10-13 DIAGNOSIS — U071 COVID-19: Secondary | ICD-10-CM | POA: Diagnosis not present

## 2022-10-19 DIAGNOSIS — F209 Schizophrenia, unspecified: Secondary | ICD-10-CM | POA: Diagnosis not present

## 2022-10-25 DIAGNOSIS — F419 Anxiety disorder, unspecified: Secondary | ICD-10-CM | POA: Diagnosis not present

## 2022-10-25 DIAGNOSIS — F32A Depression, unspecified: Secondary | ICD-10-CM | POA: Diagnosis not present

## 2022-10-25 DIAGNOSIS — F209 Schizophrenia, unspecified: Secondary | ICD-10-CM | POA: Diagnosis not present

## 2022-11-09 DIAGNOSIS — L309 Dermatitis, unspecified: Secondary | ICD-10-CM | POA: Diagnosis not present

## 2022-11-09 DIAGNOSIS — R451 Restlessness and agitation: Secondary | ICD-10-CM | POA: Diagnosis not present

## 2022-11-11 ENCOUNTER — Emergency Department (HOSPITAL_COMMUNITY): Payer: Medicare Other

## 2022-11-11 ENCOUNTER — Other Ambulatory Visit: Payer: Self-pay

## 2022-11-11 ENCOUNTER — Encounter (HOSPITAL_COMMUNITY): Payer: Self-pay

## 2022-11-11 ENCOUNTER — Emergency Department (HOSPITAL_COMMUNITY)
Admission: EM | Admit: 2022-11-11 | Discharge: 2022-11-11 | Disposition: A | Discharge status: Skilled Nursing Facility | Payer: Medicare Other | Attending: Emergency Medicine | Admitting: Emergency Medicine

## 2022-11-11 DIAGNOSIS — W19XXXA Unspecified fall, initial encounter: Secondary | ICD-10-CM | POA: Diagnosis not present

## 2022-11-11 DIAGNOSIS — R29810 Facial weakness: Secondary | ICD-10-CM | POA: Diagnosis not present

## 2022-11-11 DIAGNOSIS — F209 Schizophrenia, unspecified: Secondary | ICD-10-CM | POA: Diagnosis not present

## 2022-11-11 DIAGNOSIS — R4182 Altered mental status, unspecified: Secondary | ICD-10-CM | POA: Diagnosis not present

## 2022-11-11 DIAGNOSIS — W01198A Fall on same level from slipping, tripping and stumbling with subsequent striking against other object, initial encounter: Secondary | ICD-10-CM | POA: Diagnosis not present

## 2022-11-11 DIAGNOSIS — I639 Cerebral infarction, unspecified: Secondary | ICD-10-CM | POA: Diagnosis not present

## 2022-11-11 DIAGNOSIS — R531 Weakness: Secondary | ICD-10-CM | POA: Diagnosis not present

## 2022-11-11 DIAGNOSIS — M6281 Muscle weakness (generalized): Secondary | ICD-10-CM | POA: Diagnosis not present

## 2022-11-11 DIAGNOSIS — Z86006 Personal history of melanoma in-situ: Secondary | ICD-10-CM | POA: Insufficient documentation

## 2022-11-11 DIAGNOSIS — Z043 Encounter for examination and observation following other accident: Secondary | ICD-10-CM | POA: Diagnosis not present

## 2022-11-11 DIAGNOSIS — S0990XA Unspecified injury of head, initial encounter: Secondary | ICD-10-CM | POA: Insufficient documentation

## 2022-11-11 DIAGNOSIS — M25561 Pain in right knee: Secondary | ICD-10-CM | POA: Diagnosis not present

## 2022-11-11 DIAGNOSIS — K219 Gastro-esophageal reflux disease without esophagitis: Secondary | ICD-10-CM | POA: Diagnosis not present

## 2022-11-11 DIAGNOSIS — Z7401 Bed confinement status: Secondary | ICD-10-CM | POA: Diagnosis not present

## 2022-11-11 DIAGNOSIS — R296 Repeated falls: Secondary | ICD-10-CM | POA: Diagnosis not present

## 2022-11-11 NOTE — ED Triage Notes (Signed)
Patient bib [facility] form [facility] after unwitnessed fall. Unknown down time, unknown if she hit her head, but patietn states her butt hurts. She is not on blood thinners. Baseline is A&Ox3.  Patient has a history of schizophrenia, stroke, right sided facial droop from previous stroke.

## 2022-11-11 NOTE — ED Notes (Signed)
Attempted to call [name] to give report on the patient coming back to them.  This RN attempted to call twice and got a answering machine.

## 2022-11-11 NOTE — ED Provider Notes (Signed)
Emergency Department Provider Note   I have reviewed the triage vital signs and the nursing notes.   HISTORY  Chief Complaint Fall   HPI Kaitlyn Duncan is a 52 y.o. female with PMH of GERD, anemia, and prior CVA affecting the right side presents to the ED via EMS following an unwitnessed fall. She tells me she was trying to transfer from her bed to her wheelchair when she lost her balance and fell. She reports she did hit her head and "my butt hurts." No staff witnessed the fall. Patient denies neck or mid-back pain. No CP or SOB. No abdominal pain. No weakness or confusion.    Past Medical History:  Diagnosis Date   Allergy    Anemia    Gallstones    GERD (gastroesophageal reflux disease)    Hemorrhoids    melanoma with met dz dx'd 01/2018   brain and adrenal   Seasonal allergies    Stroke [facility])     Review of Systems  Constitutional: No fever/chills Cardiovascular: Denies chest pain. Respiratory: Denies shortness of breath. Gastrointestinal: No abdominal pain.  No nausea, no vomiting.  Genitourinary: Negative for dysuria. Musculoskeletal: Negative for back pain. Positive buttock/pelvis pain.  Skin: Negative for rash.  Neurological: Negative for focal weakness or numbness. Positive HA.   ____________________________________________   PHYSICAL EXAM:  VITAL SIGNS: ED Triage Vitals  Enc Vitals Group     BP 11/11/22 1614 121/75     Pulse Rate 11/11/22 1614 62     Resp 11/11/22 1614 16     Temp 11/11/22 1614 98 F (36.7 C)     Temp Source 11/11/22 1614 Oral     SpO2 11/11/22 1604 94 %     Weight 11/11/22 1619 127 lb 13.9 oz (58 kg)     Height 11/11/22 1619 5' (1.524 m)   Constitutional: Alert and oriented. Well appearing and in no acute distress. Eyes: Conjunctivae are normal.  Head: Atraumatic. Nose: No congestion/rhinnorhea. Mouth/Throat: Mucous membranes are moist. Neck: No stridor.  No cervical spine tenderness to palpation. Cardiovascular:  Normal rate, regular rhythm. Good peripheral circulation. Grossly normal heart sounds.   Respiratory: Normal respiratory effort.  No retractions. Lungs CTAB. Gastrointestinal: Soft and nontender. No distention.  Musculoskeletal: Contracted RUE with increased tone here and in the RLE (baseline). Normal ROM of the LUE.  Neurologic: Right arm and leg weakness with increased tone from prior CVA. Right face droop.  Skin:  Skin is warm, dry and intact. No rash noted.  ____________________________________________  RADIOLOGY  CT Cervical Spine Wo Contrast  Result Date: 11/11/2022 CLINICAL DATA:  Un witnessed fall EXAM: CT CERVICAL SPINE WITHOUT CONTRAST TECHNIQUE: Multidetector CT imaging of the cervical spine was performed without intravenous contrast. Multiplanar CT image reconstructions were also generated. RADIATION DOSE REDUCTION: This exam was performed according to the departmental dose-optimization program which includes automated exposure control, adjustment of the mA and/or kV according to patient size and/or use of iterative reconstruction technique. COMPARISON:  04/08/2022 FINDINGS: Alignment: Alignment is grossly anatomic. Skull base and vertebrae: No acute fracture. No primary bone lesion or focal pathologic process. Soft tissues and spinal canal: No prevertebral fluid or swelling. No visible canal hematoma. Disc levels: Moderate spondylosis at C5-6 and C6-7. There is mild diffuse facet hypertrophy greatest at C4-5 and C5-6. Upper chest: Airway is patent.  Trace bilateral pleural fluid. Other: Reconstructed images demonstrate no additional findings. IMPRESSION: 1. No acute cervical spine fracture. 2. Multilevel cervical degenerative changes as above. 3.  Trace bilateral pleural effusions. Electronically Signed   By: Sharlet Salina M.D.   On: 11/11/2022 18:26   CT Head Wo Contrast  Result Date: 11/11/2022 CLINICAL DATA:  Un witnessed fall EXAM: CT HEAD WITHOUT CONTRAST TECHNIQUE: Contiguous axial  images were obtained from the base of the skull through the vertex without intravenous contrast. RADIATION DOSE REDUCTION: This exam was performed according to the departmental dose-optimization program which includes automated exposure control, adjustment of the mA and/or kV according to patient size and/or use of iterative reconstruction technique. COMPARISON:  08/03/2022 FINDINGS: Brain: Chronic small vessel ischemic changes are seen throughout the periventricular white matter and within the left thalamus, stable. No evidence of acute infarct or hemorrhage. Lateral ventricles and midline structures are stable. No acute extra-axial fluid collections. No mass effect. Vascular: No hyperdense vessel or unexpected calcification. Skull: Normal. Negative for fracture or focal lesion. Sinuses/Orbits: No acute finding. Other: None. IMPRESSION: 1. No acute intracranial process. 2. Stable chronic small-vessel ischemic changes. Electronically Signed   By: Sharlet Salina M.D.   On: 11/11/2022 18:17   DG Pelvis Portable  Result Date: 11/11/2022 CLINICAL DATA:  Fall EXAM: PORTABLE PELVIS 1-2 VIEWS COMPARISON:  Radiographs 04/08/2022 FINDINGS: No acute fracture or dislocation. Degenerative changes pubic symphysis, both hips, SI joints and lower lumbar spine. Sclerotic lesion in the right sacrum compatible with bone island. IMPRESSION: No acute fracture or dislocation. Electronically Signed   By: Minerva Fester M.D.   On: 11/11/2022 18:03   DG Chest Portable 1 View  Result Date: 11/11/2022 CLINICAL DATA:  Fall EXAM: PORTABLE CHEST 1 VIEW COMPARISON:  Radiographs 04/08/2022 FINDINGS: Stable cardiomediastinal silhouette. No focal consolidation, pleural effusion, or pneumothorax. No displaced rib fractures. IMPRESSION: No active disease. Electronically Signed   By: Minerva Fester M.D.   On: 11/11/2022 18:02    ____________________________________________   PROCEDURES  Procedure(s) performed:   Procedures  None   ____________________________________________   INITIAL IMPRESSION / ASSESSMENT AND PLAN / ED COURSE  Pertinent labs & imaging results that were available during my care of the patient were reviewed by me and considered in my medical decision making (see chart for details).   This patient is Presenting for Evaluation of fall, which does require a range of treatment options, and is a complaint that involves a high risk of morbidity and mortality.  The Differential Diagnoses includes subdural hematoma, epidural hematoma, acute concussion, traumatic subarachnoid hemorrhage, cerebral contusions, etc.  I did obtain Additional Historical Information from EMS.  I decided to review pertinent External Data, and in summary patient with February 2024 with multiple falls.    Clinical Laboratory Tests: Considered labs but patient is awake, alert. Denies other symptoms. Hold on labs for now.   Radiologic Tests Ordered, included CT head, c spine, and plain films. I independently interpreted the images and agree with radiology interpretation.   Cardiac Monitor Tracing which shows NSR.   Social Determinants of Health Risk patient is a non-smoker.   Medical Decision Making: Summary:  Patient presents to the ED after mechanical fall. Plan for trauma scans and reassess. Patient is not anticoagulated.   Reevaluation with update and discussion with patient. CT imaging and XR reassuring. Stable for transfer back to facility.   Patient's presentation is most consistent with acute presentation with potential threat to life or bodily function.   Disposition: discharge  ____________________________________________  FINAL CLINICAL IMPRESSION(S) / ED DIAGNOSES  Final diagnoses:  Fall, initial encounter  Injury of head, initial encounter   Note:  This document was prepared using Dragon voice recognition software and may include unintentional dictation errors.  Alona Bene, MD, [facility] Emergency  Medicine    Adain Geurin, Arlyss Repress, MD 11/11/22 [phone]

## 2022-11-11 NOTE — Discharge Instructions (Signed)
Your CT scans and x-rays did not show anything broken or bleeding. You can return to your nursing facility and follow with your primary care doctor.

## 2022-11-12 DIAGNOSIS — L309 Dermatitis, unspecified: Secondary | ICD-10-CM | POA: Diagnosis not present

## 2022-11-12 DIAGNOSIS — W19XXXA Unspecified fall, initial encounter: Secondary | ICD-10-CM | POA: Diagnosis not present

## 2022-11-12 DIAGNOSIS — R451 Restlessness and agitation: Secondary | ICD-10-CM | POA: Diagnosis not present

## 2022-11-16 DIAGNOSIS — R279 Unspecified lack of coordination: Secondary | ICD-10-CM | POA: Diagnosis not present

## 2022-11-16 DIAGNOSIS — I679 Cerebrovascular disease, unspecified: Secondary | ICD-10-CM | POA: Diagnosis not present

## 2022-11-16 DIAGNOSIS — F419 Anxiety disorder, unspecified: Secondary | ICD-10-CM | POA: Diagnosis not present

## 2022-11-16 DIAGNOSIS — M6281 Muscle weakness (generalized): Secondary | ICD-10-CM | POA: Diagnosis not present

## 2022-11-16 DIAGNOSIS — F209 Schizophrenia, unspecified: Secondary | ICD-10-CM | POA: Diagnosis not present

## 2022-11-16 DIAGNOSIS — F32A Depression, unspecified: Secondary | ICD-10-CM | POA: Diagnosis not present

## 2022-11-17 DIAGNOSIS — M6281 Muscle weakness (generalized): Secondary | ICD-10-CM | POA: Diagnosis not present

## 2022-11-17 DIAGNOSIS — R279 Unspecified lack of coordination: Secondary | ICD-10-CM | POA: Diagnosis not present

## 2022-11-17 DIAGNOSIS — I679 Cerebrovascular disease, unspecified: Secondary | ICD-10-CM | POA: Diagnosis not present

## 2022-11-18 DIAGNOSIS — I679 Cerebrovascular disease, unspecified: Secondary | ICD-10-CM | POA: Diagnosis not present

## 2022-11-18 DIAGNOSIS — M6281 Muscle weakness (generalized): Secondary | ICD-10-CM | POA: Diagnosis not present

## 2022-11-18 DIAGNOSIS — R279 Unspecified lack of coordination: Secondary | ICD-10-CM | POA: Diagnosis not present

## 2022-11-19 DIAGNOSIS — R279 Unspecified lack of coordination: Secondary | ICD-10-CM | POA: Diagnosis not present

## 2022-11-19 DIAGNOSIS — I679 Cerebrovascular disease, unspecified: Secondary | ICD-10-CM | POA: Diagnosis not present

## 2022-11-19 DIAGNOSIS — M6281 Muscle weakness (generalized): Secondary | ICD-10-CM | POA: Diagnosis not present

## 2022-11-20 DIAGNOSIS — M6281 Muscle weakness (generalized): Secondary | ICD-10-CM | POA: Diagnosis not present

## 2022-11-20 DIAGNOSIS — R279 Unspecified lack of coordination: Secondary | ICD-10-CM | POA: Diagnosis not present

## 2022-11-20 DIAGNOSIS — I679 Cerebrovascular disease, unspecified: Secondary | ICD-10-CM | POA: Diagnosis not present

## 2022-11-24 DIAGNOSIS — R279 Unspecified lack of coordination: Secondary | ICD-10-CM | POA: Diagnosis not present

## 2022-11-24 DIAGNOSIS — I679 Cerebrovascular disease, unspecified: Secondary | ICD-10-CM | POA: Diagnosis not present

## 2022-11-24 DIAGNOSIS — M6281 Muscle weakness (generalized): Secondary | ICD-10-CM | POA: Diagnosis not present

## 2022-11-26 DIAGNOSIS — M6281 Muscle weakness (generalized): Secondary | ICD-10-CM | POA: Diagnosis not present

## 2022-11-26 DIAGNOSIS — R279 Unspecified lack of coordination: Secondary | ICD-10-CM | POA: Diagnosis not present

## 2022-11-26 DIAGNOSIS — I679 Cerebrovascular disease, unspecified: Secondary | ICD-10-CM | POA: Diagnosis not present

## 2022-11-27 DIAGNOSIS — R279 Unspecified lack of coordination: Secondary | ICD-10-CM | POA: Diagnosis not present

## 2022-11-27 DIAGNOSIS — M6281 Muscle weakness (generalized): Secondary | ICD-10-CM | POA: Diagnosis not present

## 2022-11-27 DIAGNOSIS — I679 Cerebrovascular disease, unspecified: Secondary | ICD-10-CM | POA: Diagnosis not present

## 2022-11-28 DIAGNOSIS — R279 Unspecified lack of coordination: Secondary | ICD-10-CM | POA: Diagnosis not present

## 2022-11-28 DIAGNOSIS — I679 Cerebrovascular disease, unspecified: Secondary | ICD-10-CM | POA: Diagnosis not present

## 2022-11-28 DIAGNOSIS — M6281 Muscle weakness (generalized): Secondary | ICD-10-CM | POA: Diagnosis not present

## 2022-11-29 DIAGNOSIS — M6281 Muscle weakness (generalized): Secondary | ICD-10-CM | POA: Diagnosis not present

## 2022-11-29 DIAGNOSIS — I679 Cerebrovascular disease, unspecified: Secondary | ICD-10-CM | POA: Diagnosis not present

## 2022-11-29 DIAGNOSIS — R279 Unspecified lack of coordination: Secondary | ICD-10-CM | POA: Diagnosis not present

## 2022-11-30 DIAGNOSIS — R279 Unspecified lack of coordination: Secondary | ICD-10-CM | POA: Diagnosis not present

## 2022-11-30 DIAGNOSIS — M6281 Muscle weakness (generalized): Secondary | ICD-10-CM | POA: Diagnosis not present

## 2022-11-30 DIAGNOSIS — I679 Cerebrovascular disease, unspecified: Secondary | ICD-10-CM | POA: Diagnosis not present

## 2022-12-03 DIAGNOSIS — I679 Cerebrovascular disease, unspecified: Secondary | ICD-10-CM | POA: Diagnosis not present

## 2022-12-03 DIAGNOSIS — M6281 Muscle weakness (generalized): Secondary | ICD-10-CM | POA: Diagnosis not present

## 2022-12-03 DIAGNOSIS — R279 Unspecified lack of coordination: Secondary | ICD-10-CM | POA: Diagnosis not present

## 2022-12-04 DIAGNOSIS — M6281 Muscle weakness (generalized): Secondary | ICD-10-CM | POA: Diagnosis not present

## 2022-12-04 DIAGNOSIS — R279 Unspecified lack of coordination: Secondary | ICD-10-CM | POA: Diagnosis not present

## 2022-12-04 DIAGNOSIS — I679 Cerebrovascular disease, unspecified: Secondary | ICD-10-CM | POA: Diagnosis not present

## 2022-12-05 DIAGNOSIS — M6281 Muscle weakness (generalized): Secondary | ICD-10-CM | POA: Diagnosis not present

## 2022-12-05 DIAGNOSIS — R279 Unspecified lack of coordination: Secondary | ICD-10-CM | POA: Diagnosis not present

## 2022-12-05 DIAGNOSIS — I679 Cerebrovascular disease, unspecified: Secondary | ICD-10-CM | POA: Diagnosis not present

## 2022-12-06 DIAGNOSIS — M6281 Muscle weakness (generalized): Secondary | ICD-10-CM | POA: Diagnosis not present

## 2022-12-06 DIAGNOSIS — I679 Cerebrovascular disease, unspecified: Secondary | ICD-10-CM | POA: Diagnosis not present

## 2022-12-06 DIAGNOSIS — R279 Unspecified lack of coordination: Secondary | ICD-10-CM | POA: Diagnosis not present

## 2022-12-07 DIAGNOSIS — M6281 Muscle weakness (generalized): Secondary | ICD-10-CM | POA: Diagnosis not present

## 2022-12-07 DIAGNOSIS — R279 Unspecified lack of coordination: Secondary | ICD-10-CM | POA: Diagnosis not present

## 2022-12-07 DIAGNOSIS — I679 Cerebrovascular disease, unspecified: Secondary | ICD-10-CM | POA: Diagnosis not present

## 2022-12-08 DIAGNOSIS — M6281 Muscle weakness (generalized): Secondary | ICD-10-CM | POA: Diagnosis not present

## 2022-12-08 DIAGNOSIS — I679 Cerebrovascular disease, unspecified: Secondary | ICD-10-CM | POA: Diagnosis not present

## 2022-12-08 DIAGNOSIS — R279 Unspecified lack of coordination: Secondary | ICD-10-CM | POA: Diagnosis not present

## 2022-12-09 DIAGNOSIS — R279 Unspecified lack of coordination: Secondary | ICD-10-CM | POA: Diagnosis not present

## 2022-12-09 DIAGNOSIS — M6281 Muscle weakness (generalized): Secondary | ICD-10-CM | POA: Diagnosis not present

## 2022-12-09 DIAGNOSIS — I679 Cerebrovascular disease, unspecified: Secondary | ICD-10-CM | POA: Diagnosis not present

## 2022-12-10 DIAGNOSIS — R279 Unspecified lack of coordination: Secondary | ICD-10-CM | POA: Diagnosis not present

## 2022-12-10 DIAGNOSIS — M6281 Muscle weakness (generalized): Secondary | ICD-10-CM | POA: Diagnosis not present

## 2022-12-10 DIAGNOSIS — I679 Cerebrovascular disease, unspecified: Secondary | ICD-10-CM | POA: Diagnosis not present

## 2022-12-13 DIAGNOSIS — M6281 Muscle weakness (generalized): Secondary | ICD-10-CM | POA: Diagnosis not present

## 2022-12-13 DIAGNOSIS — R279 Unspecified lack of coordination: Secondary | ICD-10-CM | POA: Diagnosis not present

## 2022-12-13 DIAGNOSIS — I679 Cerebrovascular disease, unspecified: Secondary | ICD-10-CM | POA: Diagnosis not present

## 2022-12-14 DIAGNOSIS — I679 Cerebrovascular disease, unspecified: Secondary | ICD-10-CM | POA: Diagnosis not present

## 2022-12-14 DIAGNOSIS — M6281 Muscle weakness (generalized): Secondary | ICD-10-CM | POA: Diagnosis not present

## 2022-12-14 DIAGNOSIS — R279 Unspecified lack of coordination: Secondary | ICD-10-CM | POA: Diagnosis not present

## 2022-12-15 DIAGNOSIS — R279 Unspecified lack of coordination: Secondary | ICD-10-CM | POA: Diagnosis not present

## 2022-12-15 DIAGNOSIS — I679 Cerebrovascular disease, unspecified: Secondary | ICD-10-CM | POA: Diagnosis not present

## 2022-12-15 DIAGNOSIS — F32A Depression, unspecified: Secondary | ICD-10-CM | POA: Diagnosis not present

## 2022-12-15 DIAGNOSIS — M6281 Muscle weakness (generalized): Secondary | ICD-10-CM | POA: Diagnosis not present

## 2022-12-15 DIAGNOSIS — F419 Anxiety disorder, unspecified: Secondary | ICD-10-CM | POA: Diagnosis not present

## 2022-12-15 DIAGNOSIS — F2089 Other schizophrenia: Secondary | ICD-10-CM | POA: Diagnosis not present

## 2022-12-18 DIAGNOSIS — M6281 Muscle weakness (generalized): Secondary | ICD-10-CM | POA: Diagnosis not present

## 2022-12-18 DIAGNOSIS — R279 Unspecified lack of coordination: Secondary | ICD-10-CM | POA: Diagnosis not present

## 2022-12-18 DIAGNOSIS — I679 Cerebrovascular disease, unspecified: Secondary | ICD-10-CM | POA: Diagnosis not present

## 2022-12-19 DIAGNOSIS — R279 Unspecified lack of coordination: Secondary | ICD-10-CM | POA: Diagnosis not present

## 2022-12-19 DIAGNOSIS — M6281 Muscle weakness (generalized): Secondary | ICD-10-CM | POA: Diagnosis not present

## 2022-12-19 DIAGNOSIS — I679 Cerebrovascular disease, unspecified: Secondary | ICD-10-CM | POA: Diagnosis not present

## 2022-12-20 DIAGNOSIS — R5381 Other malaise: Secondary | ICD-10-CM | POA: Diagnosis not present

## 2022-12-20 DIAGNOSIS — I679 Cerebrovascular disease, unspecified: Secondary | ICD-10-CM | POA: Diagnosis not present

## 2022-12-20 DIAGNOSIS — I639 Cerebral infarction, unspecified: Secondary | ICD-10-CM | POA: Diagnosis not present

## 2022-12-20 DIAGNOSIS — K219 Gastro-esophageal reflux disease without esophagitis: Secondary | ICD-10-CM | POA: Diagnosis not present

## 2022-12-20 DIAGNOSIS — R279 Unspecified lack of coordination: Secondary | ICD-10-CM | POA: Diagnosis not present

## 2022-12-20 DIAGNOSIS — M6281 Muscle weakness (generalized): Secondary | ICD-10-CM | POA: Diagnosis not present

## 2022-12-20 DIAGNOSIS — R296 Repeated falls: Secondary | ICD-10-CM | POA: Diagnosis not present

## 2022-12-21 DIAGNOSIS — I679 Cerebrovascular disease, unspecified: Secondary | ICD-10-CM | POA: Diagnosis not present

## 2022-12-21 DIAGNOSIS — M6281 Muscle weakness (generalized): Secondary | ICD-10-CM | POA: Diagnosis not present

## 2022-12-21 DIAGNOSIS — R279 Unspecified lack of coordination: Secondary | ICD-10-CM | POA: Diagnosis not present

## 2022-12-22 DIAGNOSIS — I679 Cerebrovascular disease, unspecified: Secondary | ICD-10-CM | POA: Diagnosis not present

## 2022-12-22 DIAGNOSIS — R279 Unspecified lack of coordination: Secondary | ICD-10-CM | POA: Diagnosis not present

## 2022-12-22 DIAGNOSIS — M6281 Muscle weakness (generalized): Secondary | ICD-10-CM | POA: Diagnosis not present

## 2022-12-23 DIAGNOSIS — M6281 Muscle weakness (generalized): Secondary | ICD-10-CM | POA: Diagnosis not present

## 2022-12-23 DIAGNOSIS — R279 Unspecified lack of coordination: Secondary | ICD-10-CM | POA: Diagnosis not present

## 2022-12-23 DIAGNOSIS — I679 Cerebrovascular disease, unspecified: Secondary | ICD-10-CM | POA: Diagnosis not present

## 2022-12-24 DIAGNOSIS — R279 Unspecified lack of coordination: Secondary | ICD-10-CM | POA: Diagnosis not present

## 2022-12-24 DIAGNOSIS — I679 Cerebrovascular disease, unspecified: Secondary | ICD-10-CM | POA: Diagnosis not present

## 2022-12-24 DIAGNOSIS — M6281 Muscle weakness (generalized): Secondary | ICD-10-CM | POA: Diagnosis not present

## 2022-12-24 DIAGNOSIS — F209 Schizophrenia, unspecified: Secondary | ICD-10-CM | POA: Diagnosis not present

## 2022-12-27 DIAGNOSIS — R279 Unspecified lack of coordination: Secondary | ICD-10-CM | POA: Diagnosis not present

## 2022-12-27 DIAGNOSIS — I679 Cerebrovascular disease, unspecified: Secondary | ICD-10-CM | POA: Diagnosis not present

## 2022-12-27 DIAGNOSIS — M6281 Muscle weakness (generalized): Secondary | ICD-10-CM | POA: Diagnosis not present

## 2022-12-28 DIAGNOSIS — I679 Cerebrovascular disease, unspecified: Secondary | ICD-10-CM | POA: Diagnosis not present

## 2022-12-28 DIAGNOSIS — M6281 Muscle weakness (generalized): Secondary | ICD-10-CM | POA: Diagnosis not present

## 2022-12-28 DIAGNOSIS — R279 Unspecified lack of coordination: Secondary | ICD-10-CM | POA: Diagnosis not present

## 2022-12-29 DIAGNOSIS — R279 Unspecified lack of coordination: Secondary | ICD-10-CM | POA: Diagnosis not present

## 2022-12-29 DIAGNOSIS — I679 Cerebrovascular disease, unspecified: Secondary | ICD-10-CM | POA: Diagnosis not present

## 2022-12-29 DIAGNOSIS — M6281 Muscle weakness (generalized): Secondary | ICD-10-CM | POA: Diagnosis not present

## 2022-12-30 DIAGNOSIS — M6281 Muscle weakness (generalized): Secondary | ICD-10-CM | POA: Diagnosis not present

## 2022-12-30 DIAGNOSIS — R279 Unspecified lack of coordination: Secondary | ICD-10-CM | POA: Diagnosis not present

## 2022-12-30 DIAGNOSIS — I679 Cerebrovascular disease, unspecified: Secondary | ICD-10-CM | POA: Diagnosis not present

## 2022-12-31 DIAGNOSIS — E785 Hyperlipidemia, unspecified: Secondary | ICD-10-CM | POA: Diagnosis not present

## 2022-12-31 DIAGNOSIS — F419 Anxiety disorder, unspecified: Secondary | ICD-10-CM | POA: Diagnosis not present

## 2022-12-31 DIAGNOSIS — I679 Cerebrovascular disease, unspecified: Secondary | ICD-10-CM | POA: Diagnosis not present

## 2022-12-31 DIAGNOSIS — F32A Depression, unspecified: Secondary | ICD-10-CM | POA: Diagnosis not present

## 2022-12-31 DIAGNOSIS — F209 Schizophrenia, unspecified: Secondary | ICD-10-CM | POA: Diagnosis not present

## 2022-12-31 DIAGNOSIS — R279 Unspecified lack of coordination: Secondary | ICD-10-CM | POA: Diagnosis not present

## 2022-12-31 DIAGNOSIS — M6281 Muscle weakness (generalized): Secondary | ICD-10-CM | POA: Diagnosis not present

## 2023-01-03 DIAGNOSIS — Z79899 Other long term (current) drug therapy: Secondary | ICD-10-CM | POA: Diagnosis not present

## 2023-01-03 DIAGNOSIS — R279 Unspecified lack of coordination: Secondary | ICD-10-CM | POA: Diagnosis not present

## 2023-01-03 DIAGNOSIS — D649 Anemia, unspecified: Secondary | ICD-10-CM | POA: Diagnosis not present

## 2023-01-03 DIAGNOSIS — I679 Cerebrovascular disease, unspecified: Secondary | ICD-10-CM | POA: Diagnosis not present

## 2023-01-03 DIAGNOSIS — M6281 Muscle weakness (generalized): Secondary | ICD-10-CM | POA: Diagnosis not present

## 2023-01-04 DIAGNOSIS — R634 Abnormal weight loss: Secondary | ICD-10-CM | POA: Diagnosis not present

## 2023-01-04 DIAGNOSIS — F209 Schizophrenia, unspecified: Secondary | ICD-10-CM | POA: Diagnosis not present

## 2023-01-04 DIAGNOSIS — D696 Thrombocytopenia, unspecified: Secondary | ICD-10-CM | POA: Diagnosis not present

## 2023-01-04 DIAGNOSIS — R6339 Other feeding difficulties: Secondary | ICD-10-CM | POA: Diagnosis not present

## 2023-01-05 DIAGNOSIS — M6281 Muscle weakness (generalized): Secondary | ICD-10-CM | POA: Diagnosis not present

## 2023-01-05 DIAGNOSIS — R279 Unspecified lack of coordination: Secondary | ICD-10-CM | POA: Diagnosis not present

## 2023-01-05 DIAGNOSIS — I679 Cerebrovascular disease, unspecified: Secondary | ICD-10-CM | POA: Diagnosis not present

## 2023-01-06 DIAGNOSIS — R279 Unspecified lack of coordination: Secondary | ICD-10-CM | POA: Diagnosis not present

## 2023-01-06 DIAGNOSIS — M6281 Muscle weakness (generalized): Secondary | ICD-10-CM | POA: Diagnosis not present

## 2023-01-06 DIAGNOSIS — I679 Cerebrovascular disease, unspecified: Secondary | ICD-10-CM | POA: Diagnosis not present

## 2023-01-07 DIAGNOSIS — R279 Unspecified lack of coordination: Secondary | ICD-10-CM | POA: Diagnosis not present

## 2023-01-07 DIAGNOSIS — M6281 Muscle weakness (generalized): Secondary | ICD-10-CM | POA: Diagnosis not present

## 2023-01-07 DIAGNOSIS — I679 Cerebrovascular disease, unspecified: Secondary | ICD-10-CM | POA: Diagnosis not present

## 2023-01-07 DIAGNOSIS — D649 Anemia, unspecified: Secondary | ICD-10-CM | POA: Diagnosis not present

## 2023-01-09 DIAGNOSIS — R279 Unspecified lack of coordination: Secondary | ICD-10-CM | POA: Diagnosis not present

## 2023-01-09 DIAGNOSIS — M6281 Muscle weakness (generalized): Secondary | ICD-10-CM | POA: Diagnosis not present

## 2023-01-09 DIAGNOSIS — I679 Cerebrovascular disease, unspecified: Secondary | ICD-10-CM | POA: Diagnosis not present

## 2023-01-11 DIAGNOSIS — R634 Abnormal weight loss: Secondary | ICD-10-CM | POA: Diagnosis not present

## 2023-01-11 DIAGNOSIS — F209 Schizophrenia, unspecified: Secondary | ICD-10-CM | POA: Diagnosis not present

## 2023-01-11 DIAGNOSIS — R6339 Other feeding difficulties: Secondary | ICD-10-CM | POA: Diagnosis not present

## 2023-01-11 DIAGNOSIS — M6281 Muscle weakness (generalized): Secondary | ICD-10-CM | POA: Diagnosis not present

## 2023-01-11 DIAGNOSIS — I679 Cerebrovascular disease, unspecified: Secondary | ICD-10-CM | POA: Diagnosis not present

## 2023-01-11 DIAGNOSIS — R279 Unspecified lack of coordination: Secondary | ICD-10-CM | POA: Diagnosis not present

## 2023-01-12 DIAGNOSIS — M6281 Muscle weakness (generalized): Secondary | ICD-10-CM | POA: Diagnosis not present

## 2023-01-12 DIAGNOSIS — R279 Unspecified lack of coordination: Secondary | ICD-10-CM | POA: Diagnosis not present

## 2023-01-12 DIAGNOSIS — I679 Cerebrovascular disease, unspecified: Secondary | ICD-10-CM | POA: Diagnosis not present

## 2023-01-13 DIAGNOSIS — R279 Unspecified lack of coordination: Secondary | ICD-10-CM | POA: Diagnosis not present

## 2023-01-13 DIAGNOSIS — M6281 Muscle weakness (generalized): Secondary | ICD-10-CM | POA: Diagnosis not present

## 2023-01-13 DIAGNOSIS — I679 Cerebrovascular disease, unspecified: Secondary | ICD-10-CM | POA: Diagnosis not present

## 2023-01-14 DIAGNOSIS — R279 Unspecified lack of coordination: Secondary | ICD-10-CM | POA: Diagnosis not present

## 2023-01-14 DIAGNOSIS — M6281 Muscle weakness (generalized): Secondary | ICD-10-CM | POA: Diagnosis not present

## 2023-01-14 DIAGNOSIS — I679 Cerebrovascular disease, unspecified: Secondary | ICD-10-CM | POA: Diagnosis not present

## 2023-01-15 DIAGNOSIS — I679 Cerebrovascular disease, unspecified: Secondary | ICD-10-CM | POA: Diagnosis not present

## 2023-01-15 DIAGNOSIS — M6281 Muscle weakness (generalized): Secondary | ICD-10-CM | POA: Diagnosis not present

## 2023-01-15 DIAGNOSIS — R279 Unspecified lack of coordination: Secondary | ICD-10-CM | POA: Diagnosis not present

## 2023-01-18 DIAGNOSIS — M6281 Muscle weakness (generalized): Secondary | ICD-10-CM | POA: Diagnosis not present

## 2023-01-18 DIAGNOSIS — I679 Cerebrovascular disease, unspecified: Secondary | ICD-10-CM | POA: Diagnosis not present

## 2023-01-18 DIAGNOSIS — R279 Unspecified lack of coordination: Secondary | ICD-10-CM | POA: Diagnosis not present

## 2023-01-28 DIAGNOSIS — F209 Schizophrenia, unspecified: Secondary | ICD-10-CM | POA: Diagnosis not present

## 2023-01-28 DIAGNOSIS — R634 Abnormal weight loss: Secondary | ICD-10-CM | POA: Diagnosis not present

## 2023-01-28 DIAGNOSIS — R6339 Other feeding difficulties: Secondary | ICD-10-CM | POA: Diagnosis not present

## 2023-02-16 DIAGNOSIS — F2089 Other schizophrenia: Secondary | ICD-10-CM | POA: Diagnosis not present

## 2023-02-16 DIAGNOSIS — F419 Anxiety disorder, unspecified: Secondary | ICD-10-CM | POA: Diagnosis not present

## 2023-02-16 DIAGNOSIS — F32A Depression, unspecified: Secondary | ICD-10-CM | POA: Diagnosis not present

## 2023-03-01 DIAGNOSIS — F331 Major depressive disorder, recurrent, moderate: Secondary | ICD-10-CM | POA: Diagnosis not present

## 2023-03-01 DIAGNOSIS — F411 Generalized anxiety disorder: Secondary | ICD-10-CM | POA: Diagnosis not present

## 2023-03-07 DIAGNOSIS — F209 Schizophrenia, unspecified: Secondary | ICD-10-CM | POA: Diagnosis not present

## 2023-03-07 DIAGNOSIS — R296 Repeated falls: Secondary | ICD-10-CM | POA: Diagnosis not present

## 2023-03-07 DIAGNOSIS — R6339 Other feeding difficulties: Secondary | ICD-10-CM | POA: Diagnosis not present

## 2023-03-07 DIAGNOSIS — R634 Abnormal weight loss: Secondary | ICD-10-CM | POA: Diagnosis not present

## 2023-03-08 DIAGNOSIS — F331 Major depressive disorder, recurrent, moderate: Secondary | ICD-10-CM | POA: Diagnosis not present

## 2023-03-08 DIAGNOSIS — F411 Generalized anxiety disorder: Secondary | ICD-10-CM | POA: Diagnosis not present

## 2023-03-18 DIAGNOSIS — F411 Generalized anxiety disorder: Secondary | ICD-10-CM | POA: Diagnosis not present

## 2023-03-18 DIAGNOSIS — F331 Major depressive disorder, recurrent, moderate: Secondary | ICD-10-CM | POA: Diagnosis not present

## 2023-03-22 DIAGNOSIS — F32A Depression, unspecified: Secondary | ICD-10-CM | POA: Diagnosis not present

## 2023-03-22 DIAGNOSIS — F419 Anxiety disorder, unspecified: Secondary | ICD-10-CM | POA: Diagnosis not present

## 2023-03-22 DIAGNOSIS — F209 Schizophrenia, unspecified: Secondary | ICD-10-CM | POA: Diagnosis not present

## 2023-04-15 IMAGING — DX DG LUMBAR SPINE COMPLETE 4+V
5 series · 5 of 5 positions shown · non-contrast
Comparison: No priors.

CLINICAL DATA: 50-year-old female with history of low back pain
after a fall.

EXAM:
LUMBAR SPINE - COMPLETE 4+ VIEW

[l-spine ap]
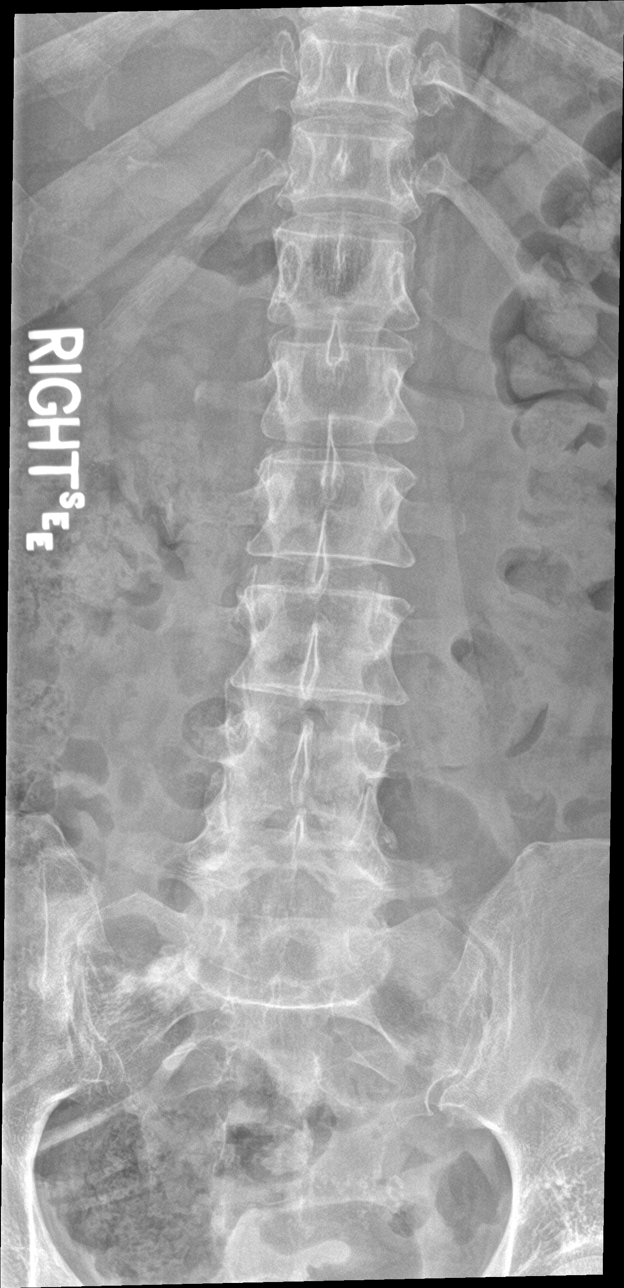

[l-spine obl (1 of 2)]
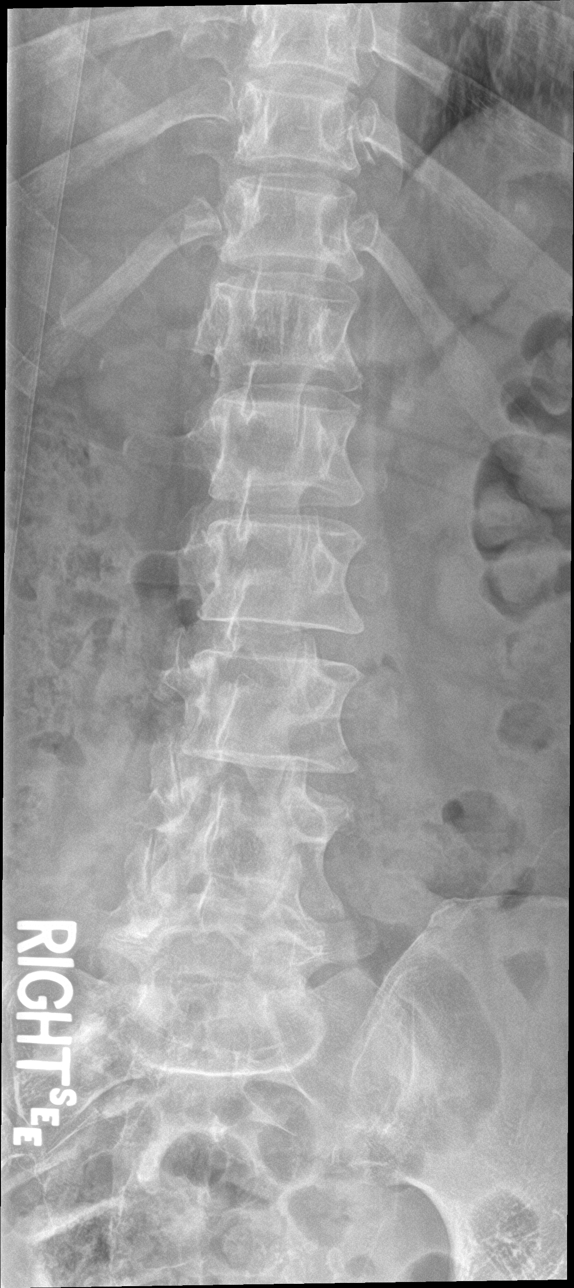

[l-spine obl (2 of 2)]
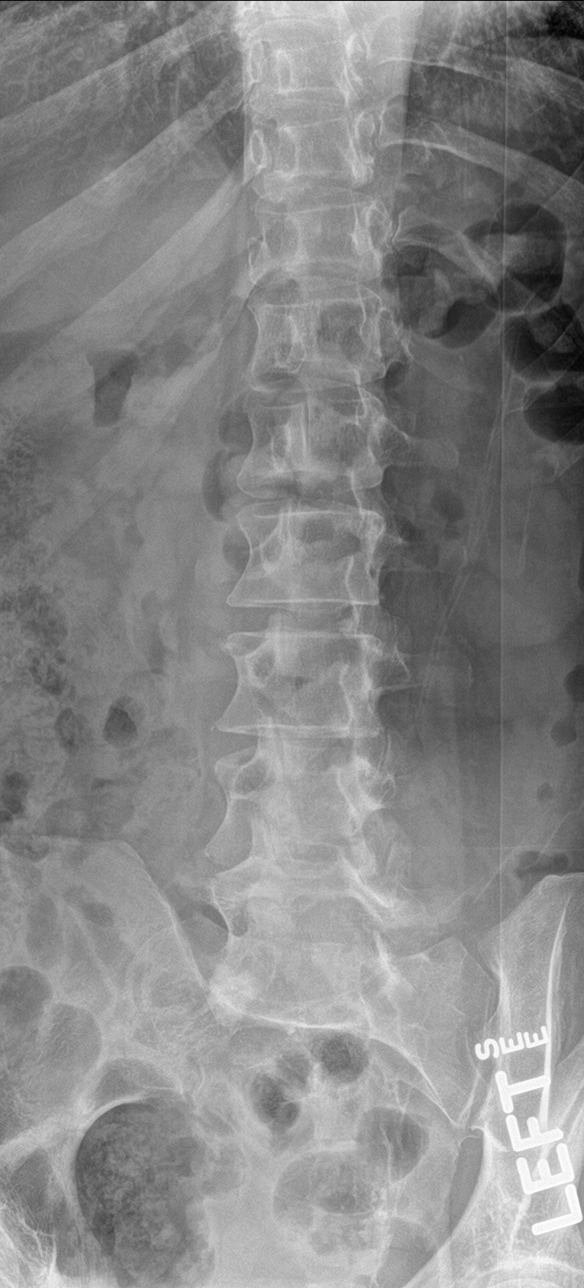

[l-spine lat]
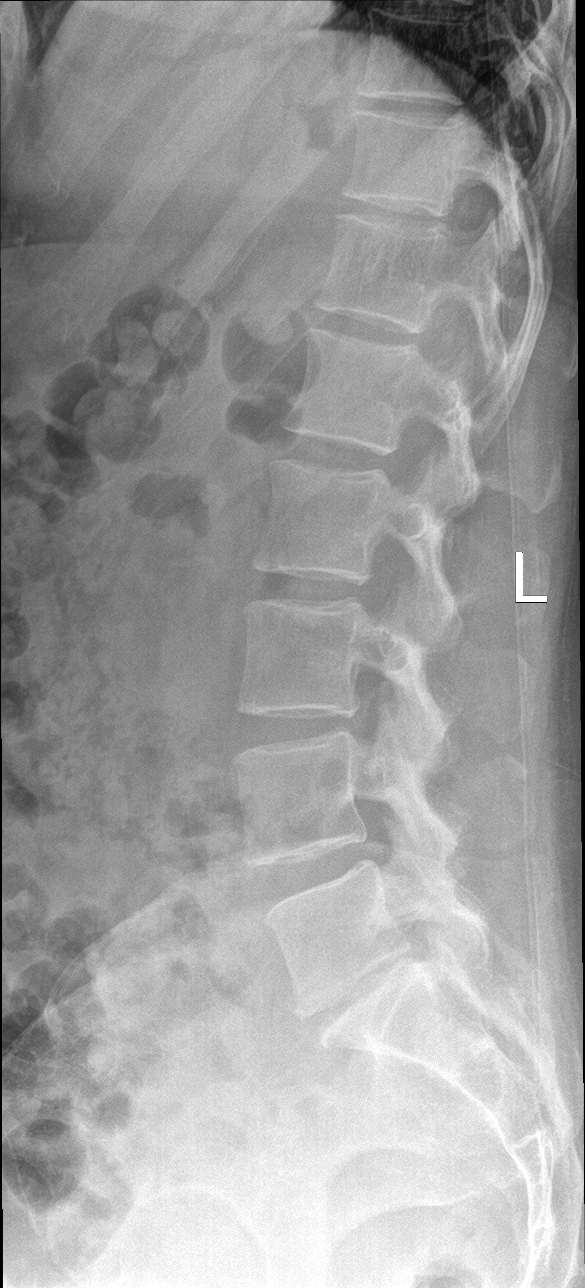

[l-spine spot]
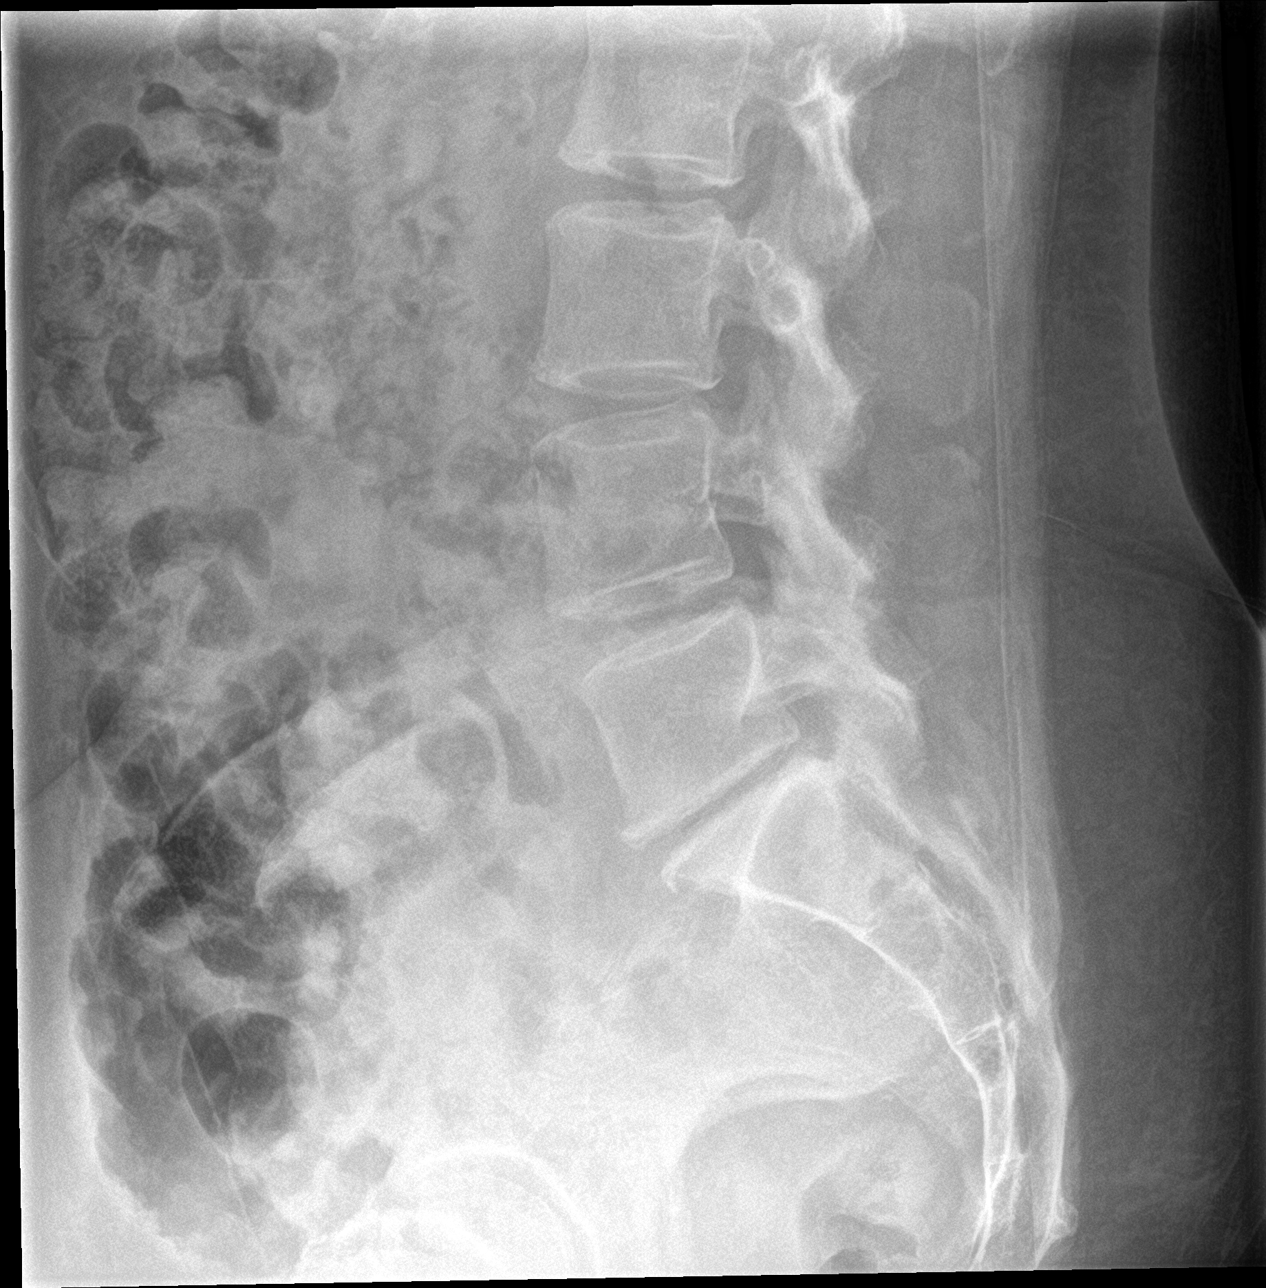

[5 of 5 positions shown; findings below may reference images not displayed]

FINDINGS: There is no evidence of lumbar spine fracture. Alignment is normal.
Intervertebral disc spaces are maintained.
IMPRESSION: Negative.

## 2023-04-15 IMAGING — DX DG PELVIS 1-2V
1 series · 1 of 1 positions shown · non-contrast
Comparison: No priors.

CLINICAL DATA: 50-year-old female with history of trauma from a
fall. Pain.

EXAM:
PELVIS - 1-2 VIEW

[pelvis ap]
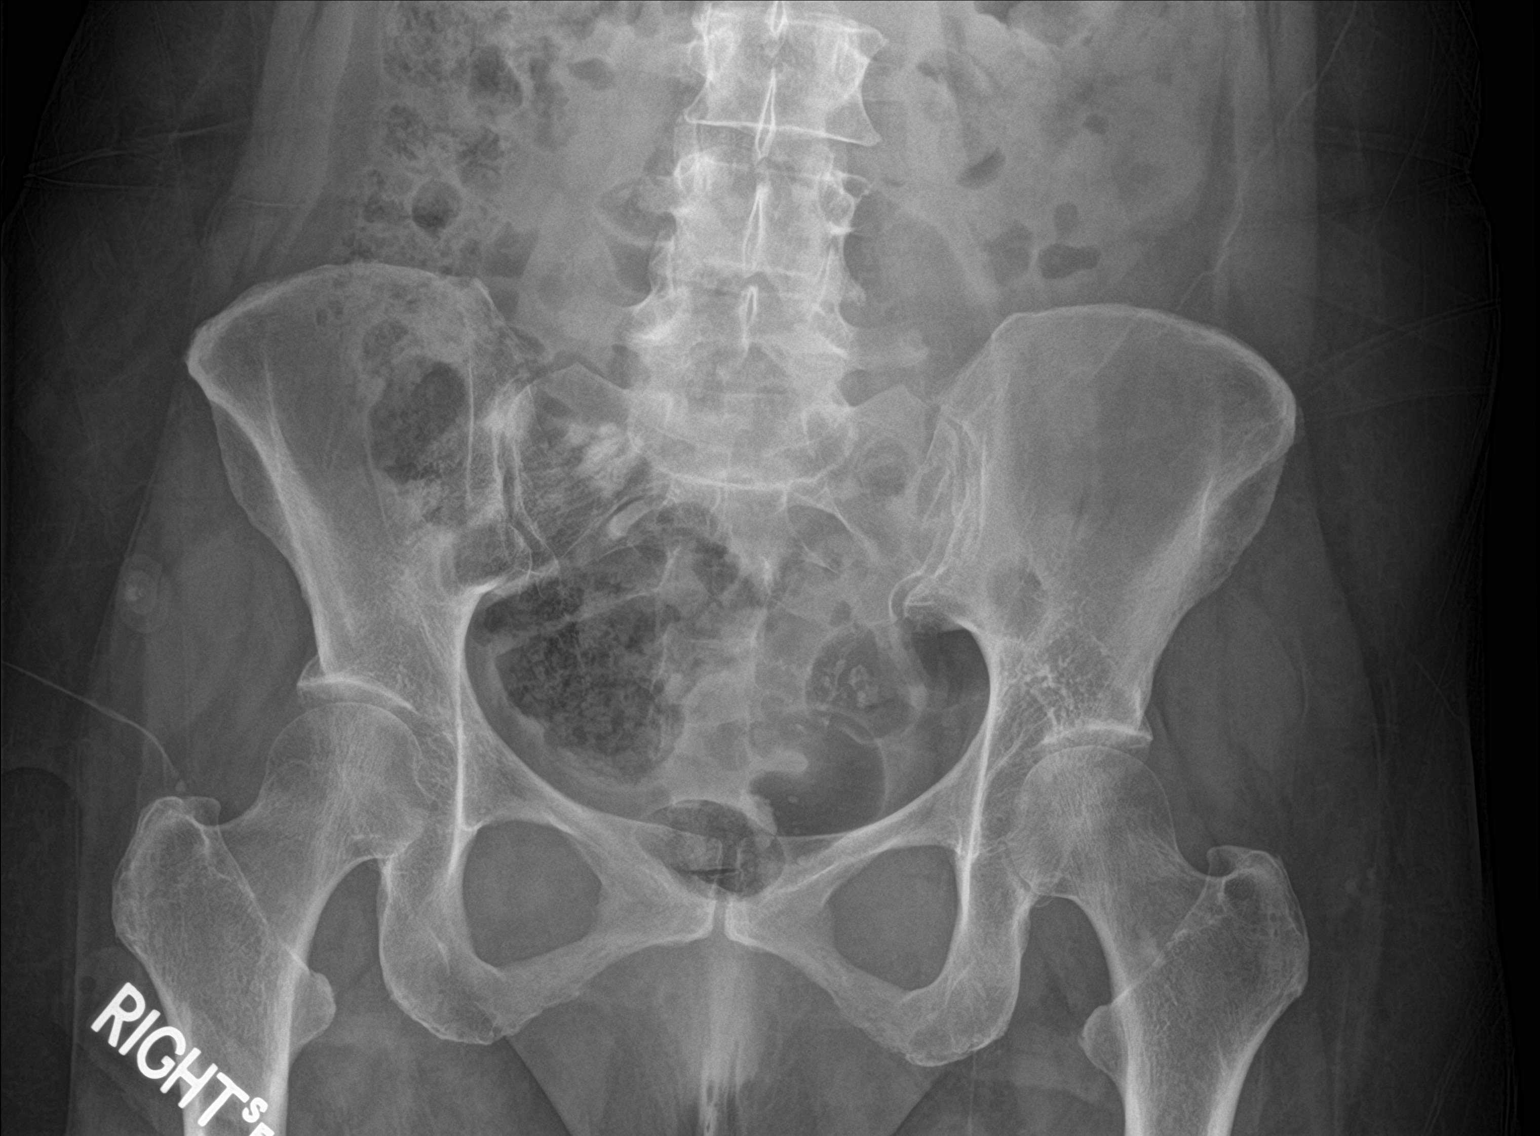

[1 of 1 positions shown; findings below may reference images not displayed]

FINDINGS: There is no evidence of pelvic fracture or diastasis. No pelvic bone
lesions are seen.
IMPRESSION: Negative.

## 2023-05-25 NOTE — Telephone Encounter (Signed)
Error

## 2023-05-27 NOTE — Telephone Encounter (Signed)
Error

## 2023-06-22 ENCOUNTER — Other Ambulatory Visit: Payer: Self-pay

## 2023-06-22 ENCOUNTER — Emergency Department (HOSPITAL_COMMUNITY)
Admission: EM | Admit: 2023-06-22 | Discharge: 2023-06-22 | Disposition: A | Discharge status: Home / Self Care | Payer: Medicare Other | Attending: Emergency Medicine | Admitting: Emergency Medicine

## 2023-06-22 ENCOUNTER — Emergency Department (HOSPITAL_COMMUNITY): Payer: Medicare Other

## 2023-06-22 ENCOUNTER — Encounter (HOSPITAL_COMMUNITY): Payer: Self-pay

## 2023-06-22 DIAGNOSIS — S8012XA Contusion of left lower leg, initial encounter: Secondary | ICD-10-CM | POA: Diagnosis not present

## 2023-06-22 DIAGNOSIS — S8992XA Unspecified injury of left lower leg, initial encounter: Secondary | ICD-10-CM | POA: Diagnosis present

## 2023-06-22 DIAGNOSIS — S1091XA Abrasion of unspecified part of neck, initial encounter: Secondary | ICD-10-CM | POA: Diagnosis not present

## 2023-06-22 DIAGNOSIS — Z85841 Personal history of malignant neoplasm of brain: Secondary | ICD-10-CM | POA: Insufficient documentation

## 2023-06-22 DIAGNOSIS — S0093XA Contusion of unspecified part of head, initial encounter: Secondary | ICD-10-CM | POA: Insufficient documentation

## 2023-06-22 DIAGNOSIS — W19XXXA Unspecified fall, initial encounter: Secondary | ICD-10-CM | POA: Insufficient documentation

## 2023-06-22 NOTE — ED Provider Notes (Signed)
[place] EMERGENCY DEPARTMENT AT [facility] Provider Note   CSN: [id number] Arrival date & time: 06/22/23  1109     History  Chief Complaint  Patient presents with   [facility] is a 52 y.o. female who presents emergency department after mechanical fall. she is not on any for any and melanoma with metastasis to the brain.  She is not on any blood thinners.  She had a mechanical fall.  She is complaining of pain to her shin, left hip and states that she hit her head and has pain in her neck.  She denies loss of consciousness.  She was ambulatory prior to arrival.   [facility] Medications Prior to Admission medications   Medication Sig Start Date End Date Taking? Authorizing Provider  linaclotide Karlene Einstein) 290 MCG CAPS capsule Take 1 capsule (290 mcg total) by mouth daily before breakfast. 07/21/22   [name], MD  risperiDONE (RISPERDAL) 1 MG tablet Take 0.5 tablets (0.5 mg total) by mouth 2 (two) times daily. 08/12/22   Glade Lloyd, MD  rosuvastatin (CRESTOR) 40 MG tablet Take 1 tablet (40 mg total) by mouth daily. 06/14/22   Rolan Bucco, MD      Allergies    Patient has no known allergies.    Review of Systems   Review of Systems  Physical Exam Updated Vital Signs BP 132/86 (BP Location: Right Arm)   Pulse 78   Temp 98.5 F (36.9 C) (Oral)   Resp 18   Ht 5' (1.524 m)   Wt 58 kg   SpO2 97%   BMI 24.97 kg/m  Physical Exam Vitals and nursing note reviewed.  Constitutional:      General: She is not in acute distress.    Appearance: She is well-developed. She is not diaphoretic.     Comments: Bizarre affect Pleasant and communicates effectively.  HENT:     Head: Normocephalic and atraumatic.     Right Ear: External ear normal.     Left Ear: External ear normal.     Nose: Nose normal.     Mouth/Throat:     Mouth: Mucous membranes are moist.  Eyes:     General: No scleral icterus.    Conjunctiva/sclera:  Conjunctivae normal.  Neck:     Comments: No midline spinal tenderness, abrasion to the left side of the neck, hematoma on the left side of the head Cardiovascular:     Rate and Rhythm: Normal rate and regular rhythm.     Heart sounds: Normal heart sounds. No murmur heard.    No friction rub. No gallop.  Pulmonary:     Effort: Pulmonary effort is normal. No respiratory distress.     Breath sounds: Normal breath sounds.  Abdominal:     General: Bowel sounds are normal. There is no distension.     Palpations: Abdomen is soft. There is no mass.     Tenderness: There is no abdominal tenderness. There is no guarding.  Musculoskeletal:     Cervical back: Normal range of motion.     Comments: Hematoma to the left anterior shin Ipsilateral knee and ankle exam.  Pain with internal and external rotation of the left hip. Normal bilateral wrist elbow and shoulder examination  Skin:    General: Skin is warm and dry.  Neurological:     Mental Status: She is alert and oriented to person, place, and time.  Psychiatric:  Behavior: Behavior normal.     ED Results / Procedures / Treatments   Labs (all labs ordered are listed, but only abnormal results are displayed) Labs Reviewed - No data to display  EKG None  Radiology No results found.  Procedures Procedures    Medications Ordered in ED Medications - No data to display  ED Course/ Medical Decision Making/ A&P Clinical Course as of 06/22/23 1447  Wed Jun 22, 2023  1446 DG Hip Unilat With Pelvis 2-3 Views Left [AH]  1446 CT Head Wo Contrast [AH]  1446 CT Cervical Spine Wo Contrast I visualized and interpreted plain film of the left hip, CT head and pelvis all of which showed no acute findings. [AH]    Clinical Course User Index [AH] Arthor Captain, PA-C                                 Medical Decision Making 51 year old female who with complaint of fall. No signs or symptoms of syncope or seizure.  I visualized and  ordered radiologic reads as per ED course, no acute findings. Encouraged home safety and pain control with over-the-counter medications.  Appropriate for discharge at this time.  Amount and/or Complexity of Data Reviewed Radiology: ordered and independent interpretation performed. Decision-making details documented in ED Course.           Final Clinical Impression(s) / ED Diagnoses Final diagnoses:  Fall, initial encounter    Rx / DC Orders ED Discharge Orders     None         Arthor Captain, PA-C 06/22/23 1448    Lorre Nick, MD 06/22/23 1616

## 2023-06-22 NOTE — Discharge Instructions (Signed)
Your x-rays and CT scans showed no signs of injury. You may be sore over the next few days.  You may take Tylenol at home for pain. Get help right away in the emergency department if you have any new or worsening concerns.

## 2023-06-22 NOTE — ED Triage Notes (Signed)
BIBA for Fall on left side. Leg, hands and neck. Pt was able to ambulate on scene. No other concerns. Denies LOC or thinners

## 2023-06-28 ENCOUNTER — Telehealth: Payer: Self-pay | Admitting: Internal Medicine

## 2023-06-28 NOTE — Telephone Encounter (Signed)
Called [facility] facility and spoke with April in regards to patient's next follow up; they are aware of scheduled appointment times/dates

## 2023-07-04 ENCOUNTER — Telehealth: Payer: Self-pay

## 2023-07-04 NOTE — Telephone Encounter (Signed)
Received a call from April at [facility] in regards to patients appts.  Informed April of pts next appts.  April stated that she will probably not come because she has been refusing care and medications at the facility.  Informed her to give [place] a call in advance to her appts to confirm.

## 2023-08-01 ENCOUNTER — Telehealth: Payer: Self-pay

## 2023-08-01 ENCOUNTER — Other Ambulatory Visit: Payer: Medicare Other

## 2023-08-01 ENCOUNTER — Ambulatory Visit: Payer: Medicare Other | Admitting: Internal Medicine

## 2023-08-01 NOTE — Telephone Encounter (Signed)
Tried to reach patient at [facility] at [phone] and the number states "were sorry your call cannot be made at this time". Patient missed appts today for lab and Dr. Arbutus Ped. Spoke with Hansel Starling and she stated that she informed facility to keep patients appts.  Informed Adrienne that I will try to keep contacting [facility], but if she hears from them to call us to get patient rescheduled.

## 2023-08-04 ENCOUNTER — Telehealth: Payer: Self-pay | Admitting: Internal Medicine

## 2023-08-04 ENCOUNTER — Telehealth: Payer: Self-pay | Admitting: *Deleted

## 2023-08-04 NOTE — Telephone Encounter (Signed)
April from [facility] called to reschedule missed appts from 1/27 with Dr. Arbutus Ped. Message was sent to Dr. Arbutus Ped scheduler to call and have pt scheduled.

## 2023-08-05 NOTE — Progress Notes (Deleted)
 [facility] OFFICE PROGRESS NOTE  Kaitlyn Duncan LABOR, MD [address] [place] [address]  DIAGNOSIS: stage IV melanoma diagnosed in July 2019. She presented with peritoneal disease and CNS involvement without known primary.   PRIOR THERAPY: He is status post subcutaneous nodule biopsy on 01/27/2018 which confirmed the presence of metastatic melanoma. She status post endoscopy on 01/28/2018 which showed subcutaneous involvement that consistent with metastatic melanoma.   Whole brain radiation for total of 30 Gy in 10 fractions started on 02/07/2018.    Mektovi  45 mg bid, [name]  450 mg daily started in August 2019.  Therapy discontinued in November 2019 after partial response.     Pembrolizumab  200 mg every 3 weeks started on 06/05/2018.  She is status post 12 cycles completed in July 2020.    CURRENT THERAPY: ***Surveillance   INTERVAL HISTORY: Kaitlyn Duncan 53 y.o. female returns to clinic today for follow-up visit.  Patient was previously followed by Dr. Amadeo who is since left the practice.  The patient is here today to establish care with Dr. Sherrod and myself.  The patient is followed for her history of melanoma for which she is on observation.  The patient is currently a resident of [name].  She has missed several appointments over the course of the last month due to refusing care.   He does not have any recent imaging.  Her most recent imaging of IR at the head or chest, abdomen, pelvis is a CT of the head on 11/11/2022 which was negative for any acute intercranial process.  Her last brain MRI was in January 2020 for which did not show any active metastatic disease to the brain.  The patient denies any recent fever, chills, night sweats, or unexplained weight loss.  Denies any chest pain, shortness of breath, cough, or hemoptysis.  Denies any nausea, vomiting, diarrhea, or constipation.  Denies any headache or visual changes.  Denies any rashes  or skin changes.  She is not currently receiving any routine skin checks by dermatology. She is here today to get re-established in the clinic.      MEDICAL HISTORY: Past Medical History:  Diagnosis Date   Allergy    Anemia    Gallstones    GERD (gastroesophageal reflux disease)    Hemorrhoids    melanoma with met dz dx'd 01/2018   brain and adrenal   Seasonal allergies    Stroke (HCC)     ALLERGIES:  has no known allergies.  MEDICATIONS:  Current Outpatient Medications  Medication Sig Dispense Refill   linaclotide  (LINZESS ) 290 MCG CAPS capsule Take 1 capsule (290 mcg total) by mouth daily before breakfast. 90 capsule 1   risperiDONE  (RISPERDAL ) 1 MG tablet Take 0.5 tablets (0.5 mg total) by mouth 2 (two) times daily.     rosuvastatin  (CRESTOR ) 40 MG tablet Take 1 tablet (40 mg total) by mouth daily. 30 tablet 3   No current facility-administered medications for this visit.    SURGICAL HISTORY:  Past Surgical History:  Procedure Laterality Date   BIOPSY  01/28/2018   Procedure: BIOPSY;  Surgeon: Celestia Agent, MD;  Location: [facility];  Service: Endoscopy;;   BREAST BIOPSY Left    ESOPHAGOGASTRODUODENOSCOPY N/A 01/28/2018   Procedure: ESOPHAGOGASTRODUODENOSCOPY (EGD);  Surgeon: Celestia Agent, MD;  Location: [facility];  Service: Endoscopy;  Laterality: N/A;   MOUTH SURGERY     OVARY SURGERY     removed something    REVIEW OF SYSTEMS:  Review of Systems  Constitutional: Negative for appetite change, chills, fatigue, fever and unexpected weight change.  HENT:   Negative for mouth sores, nosebleeds, sore throat and trouble swallowing.   Eyes: Negative for eye problems and icterus.  Respiratory: Negative for cough, hemoptysis, shortness of breath and wheezing.   Cardiovascular: Negative for chest pain and leg swelling.  Gastrointestinal: Negative for abdominal pain, constipation, diarrhea, nausea and vomiting.  Genitourinary: Negative for bladder incontinence,  difficulty urinating, dysuria, frequency and hematuria.   Musculoskeletal: Negative for back pain, gait problem, neck pain and neck stiffness.  Skin: Negative for itching and rash.  Neurological: Negative for dizziness, extremity weakness, gait problem, headaches, light-headedness and seizures.  Hematological: Negative for adenopathy. Does not bruise/bleed easily.  Psychiatric/Behavioral: Negative for confusion, depression and sleep disturbance. The patient is not nervous/anxious.     PHYSICAL EXAMINATION:  There were no vitals taken for this visit.  ECOG PERFORMANCE STATUS: {CHL ONC ECOG [id number]  Physical Exam  Constitutional: Oriented to person, place, and time and well-developed, well-nourished, and in no distress. No distress.  HENT:  Head: Normocephalic and atraumatic.  Mouth/Throat: Oropharynx is clear and moist. No oropharyngeal exudate.  Eyes: Conjunctivae are normal. Right eye exhibits no discharge. Left eye exhibits no discharge. No scleral icterus.  Neck: Normal range of motion. Neck supple.  Cardiovascular: Normal rate, regular rhythm, normal heart sounds and intact distal pulses.   Pulmonary/Chest: Effort normal and breath sounds normal. No respiratory distress. No wheezes. No rales.  Abdominal: Soft. Bowel sounds are normal. Exhibits no distension and no mass. There is no tenderness.  Musculoskeletal: Normal range of motion. Exhibits no edema.  Lymphadenopathy:    No cervical adenopathy.  Neurological: Alert and oriented to person, place, and time. Exhibits normal muscle tone. Gait normal. Coordination normal.  Skin: Skin is warm and dry. No rash noted. Not diaphoretic. No erythema. No pallor.  Psychiatric: Mood, memory and judgment normal.  Vitals reviewed.  LABORATORY DATA: Lab Results  Component Value Date   WBC 5.5 08/04/2022   HGB 12.3 08/04/2022   HCT 35.5 (L) 08/04/2022   MCV 80.5 08/04/2022   PLT 38 (L) 08/04/2022      Chemistry      Component  Value Date/Time   NA 137 08/07/2022 0223   K 3.7 08/07/2022 0223   CL 104 08/07/2022 0223   CO2 27 08/07/2022 0223   BUN 13 08/07/2022 0223   CREATININE 0.61 08/07/2022 0223   CREATININE 0.66 02/02/2022 1207      Component Value Date/Time   CALCIUM  8.9 08/07/2022 0223   ALKPHOS 75 08/04/2022 0826   AST 38 08/04/2022 0826   AST 24 02/02/2022 1207   ALT 23 08/04/2022 0826   ALT 17 02/02/2022 1207   BILITOT 1.2 08/04/2022 0826   BILITOT 0.4 02/02/2022 1207       RADIOGRAPHIC STUDIES:  No results found.   ASSESSMENT/PLAN:  This is a very pleasant 53 year old female with a history of stage IV melanoma of unknown primary.  She initially presented with peritoneal, adrenal, and CNS involvement in July 2019.  She had response to BRAF targeted treatment as well as immunotherapy.  She was previously followed by Dr. Amadeo who is since left the practice.  She has been lost to follow-up since September 2023.  She has not had any imaging studies of the chest, abdomen, and pelvis since ***  The patient met with Dr. Sherrod today.  The patient is overdue for repeat brain MRI and CT  scan***vs PET scan.   I will call the patient with the results.  If any concerning findings, we will plan on seeing her sooner.  If no concerning findings we will see her back for follow-up visit in 6 months.  CNS metastasis: CT scan of the head obtained on 06/30/2021 did not show any evidence of metastatic disease.  This will be repeated as needed.   The patient was advised to call immediately if she has any concerning symptoms in the interval. The patient voices understanding of current disease status and treatment options and is in agreement with the current care plan. All questions were answered. The patient knows to call the clinic with any problems, questions or concerns. We can certainly see the patient much sooner if necessary    No orders of the defined types were placed in this encounter.    I  spent {CHL ONC TIME VISIT - DTPQU:[id number]} counseling the patient face to face. The total time spent in the appointment was {CHL ONC TIME VISIT - DTPQU:[id number]}.  Kaitlyn Duncan L Jamicia Haaland, PA-C 08/05/23

## 2023-08-09 ENCOUNTER — Telehealth: Payer: Self-pay | Admitting: Physician Assistant

## 2023-08-10 ENCOUNTER — Other Ambulatory Visit: Payer: Medicare Other

## 2023-08-10 ENCOUNTER — Ambulatory Visit: Payer: Medicare Other | Admitting: Physician Assistant

## 2023-08-13 NOTE — Progress Notes (Signed)
[facility] OFFICE PROGRESS NOTE  Kaitlyn Broker, MD [address] [place] [address]  DIAGNOSIS: stage IV melanoma diagnosed in July 2019. She presented with peritoneal disease and CNS involvement without known primary.   PRIOR THERAPY: He is status post subcutaneous nodule biopsy on 01/27/2018 which confirmed the presence of metastatic melanoma. She status post endoscopy on 01/28/2018 which showed subcutaneous involvement that consistent with metastatic melanoma.   Whole brain radiation for total of 30 Gy in 10 fractions started on 02/07/2018.    Mektovi 45 mg bid, [name] 450 mg daily started in August 2019.  Therapy discontinued in November 2019 after partial response.   Pembrolizumab 200 mg every 3 weeks started on 06/05/2018.  She is status post 12 cycles completed in July 2020.    CURRENT THERAPY: Surveillance   INTERVAL HISTORY: Kaitlyn Duncan 53 y.o. female returns to the clinic today for a follow-up visit accompanied by a staff member from [facility]. The patient has schizophrenia.  The patient was previously followed by Dr. Clelia Croft who is since left the practice.  The patient is here today to establish care with Dr. Arbutus Ped and myself.  The patient is followed for her history of melanoma for which she is on observation currently. She has missed several appointments over the course of the last month due to refusing care. They tell me that she is refusing to take her medication as well at [facility].   He does not have any recent imaging.  Her most recent imaging of chest, abdomen, pelvis is a CT of the head on 11/11/2022 which was negative for any acute intercranial process.  Her last brain MRI was in 2023 for which did not show any active metastatic disease to the brain. Her last CT of the CAP was in August 2023.   The patient denies any recent fever, chills, night sweats, or unexplained weight loss.  A viral illness is currently going around the  SNF and she has cough and congestion. There is a family provider at [facility].  Denies any chest pain, shortness of breath, or hemoptysis.  Denies any nausea, vomiting, diarrhea, or constipation.  Denies any headache or visual changes.  Denies any rashes or skin changes.  She is not currently receiving any routine skin checks by dermatology.  She believes she went to [facility] and her provider has since left the practice and she needs to establish with another office. She is here today to get re-established in the clinic.      MEDICAL HISTORY: Past Medical History:  Diagnosis Date   Allergy    Anemia    Gallstones    GERD (gastroesophageal reflux disease)    Hemorrhoids    melanoma with met dz dx'd 01/2018   brain and adrenal   Seasonal allergies    Stroke (HCC)     ALLERGIES:  has no known allergies.  MEDICATIONS:  Current Outpatient Medications  Medication Sig Dispense Refill   linaclotide (LINZESS) 290 MCG CAPS capsule Take 1 capsule (290 mcg total) by mouth daily before breakfast. 90 capsule 1   risperiDONE (RISPERDAL) 1 MG tablet Take 0.5 tablets (0.5 mg total) by mouth 2 (two) times daily.     rosuvastatin (CRESTOR) 40 MG tablet Take 1 tablet (40 mg total) by mouth daily. 30 tablet 3   No current facility-administered medications for this visit.    SURGICAL HISTORY:  Past Surgical History:  Procedure Laterality Date   BIOPSY  01/28/2018   Procedure: BIOPSY;  Surgeon: Carman Ching, MD;  Location: [facility];  Service: Endoscopy;;   BREAST BIOPSY Left    ESOPHAGOGASTRODUODENOSCOPY N/A 01/28/2018   Procedure: ESOPHAGOGASTRODUODENOSCOPY (EGD);  Surgeon: Carman Ching, MD;  Location: [facility];  Service: Endoscopy;  Laterality: N/A;   MOUTH SURGERY     OVARY SURGERY     removed "something"    REVIEW OF SYSTEMS:   Review of Systems  Constitutional: Negative for appetite change, chills, fatigue, fever and unexpected weight change.  HENT: Positive for nasal  congestion. Negative for mouth sores, nosebleeds, sore throat and trouble swallowing.   Eyes: Negative for eye problems and icterus.  Respiratory: Positive for cough. Negative for hemoptysis, shortness of breath and wheezing.   Cardiovascular: Negative for chest pain and leg swelling.  Gastrointestinal: Negative for abdominal pain, constipation, diarrhea, nausea and vomiting.  Genitourinary: Negative for bladder incontinence, difficulty urinating, dysuria, frequency and hematuria.   Musculoskeletal: Negative for back pain, gait problem, neck pain and neck stiffness.  Skin: Negative for itching and rash.  Neurological: Negative for dizziness, extremity weakness, gait problem, headaches, light-headedness and seizures.  Hematological: Negative for adenopathy. Does not bruise/bleed easily.  Psychiatric/Behavioral: Positive for schizophrenia. Negative for confusion, depression and sleep disturbance. The patient is not nervous/anxious.     PHYSICAL EXAMINATION:  Blood pressure (!) 135/94, temperature (!) 88 F (31.1 C), temperature source Temporal, resp. rate 16, weight 114 lb 12.8 oz (52.1 kg), SpO2 98%.  ECOG PERFORMANCE STATUS: 1  Physical Exam  Constitutional: Oriented to person, place, and time and appears older than stated age and in no distress.  HENT:  Head: Normocephalic and atraumatic.  Mouth/Throat: Poor dentition. Oropharynx is clear and moist. No oropharyngeal exudate.  Eyes: Conjunctivae are normal. Right eye exhibits no discharge. Left eye exhibits no discharge. No scleral icterus.  Neck: Normal range of motion. Neck supple.  Cardiovascular: Normal rate, regular rhythm, normal heart sounds and intact distal pulses.   Pulmonary/Chest: Effort normal and breath sounds normal. No respiratory distress. No wheezes. No rales.  Abdominal: Soft. Bowel sounds are normal. Exhibits no distension and no mass. There is no tenderness.  Musculoskeletal: Normal range of motion. Exhibits no  edema.  Lymphadenopathy:    No cervical adenopathy.  Neurological: Alert and oriented to person, place, and time. Exhibits muscle wasting. Gait normal. Coordination normal.  Skin: Skin is warm and dry. No rash noted. Not diaphoretic. No erythema. No pallor.  Psychiatric: Mood, memory and judgment normal.  Vitals reviewed.  LABORATORY DATA: Lab Results  Component Value Date   WBC 2.6 (L) 08/16/2023   HGB 12.0 08/16/2023   HCT 36.3 08/16/2023   MCV 81.4 08/16/2023   PLT 56 (L) 08/16/2023      Chemistry      Component Value Date/Time   NA 129 (L) 08/16/2023 0926   K 3.4 (L) 08/16/2023 0926   CL 95 (L) 08/16/2023 0926   CO2 28 08/16/2023 0926   BUN 15 08/16/2023 0926   CREATININE 0.60 08/16/2023 0926   CREATININE 0.66 02/02/2022 1207      Component Value Date/Time   CALCIUM 8.7 (L) 08/16/2023 0926   ALKPHOS 72 08/16/2023 0926   AST 21 08/16/2023 0926   AST 24 02/02/2022 1207   ALT 9 08/16/2023 0926   ALT 17 02/02/2022 1207   BILITOT 0.3 08/16/2023 0926   BILITOT 0.4 02/02/2022 1207       RADIOGRAPHIC STUDIES:  No results found.   ASSESSMENT/PLAN:  This is  a very pleasant 53 year old female with a history of stage IV melanoma of unknown primary.  She initially presented with peritoneal, adrenal, and CNS involvement in July 2019.  She had response to BRAF targeted treatment as well as immunotherapy.  She was previously followed by Dr. Clelia Croft who is since left the practice.  She has been lost to follow-up since September 2023.  She has not had any imaging studies in several years.   The patient met with Dr. Arbutus Ped today.  The patient is overdue for repeat brain MRI and  PET scan.   I also will refer to dermatology for routine skin checks.   We will see her back in 3 weeks for evaluation and to review her scan results.   The patient was advised to call immediately if she has any concerning symptoms in the interval. The patient voices understanding of current  disease status and treatment options and is in agreement with the current care plan. All questions were answered. The patient knows to call the clinic with any problems, questions or concerns. We can certainly see the patient much sooner if necessary    Orders Placed This Encounter  Procedures   NM PET Image Restage (PS) Whole Body    Standing Status:   Future    Expected Date:   08/23/2023    Expiration Date:   08/15/2024    If indicated for the ordered procedure, I authorize the administration of a radiopharmaceutical per Radiology protocol:   Yes    Is the patient pregnant?:   No    Preferred imaging location?:   [facility]   MR Brain W Wo Contrast    Standing Status:   Future    Expiration Date:   08/15/2024    If indicated for the ordered procedure, I authorize the administration of contrast media per Radiology protocol:   Yes    What is the patient's sedation requirement?:   No Sedation    Does the patient have a pacemaker or implanted devices?:   No    Use SRS Protocol?:   No    Preferred imaging location?:   [facility] (table limit - 550 lbs)   Ambulatory referral to Dermatology    Referral Priority:   Routine    Referral Type:   Consultation    Referral Reason:   Specialty Services Required    Requested Specialty:   Dermatology    Number of Visits Requested:   1      Levert Heslop L Deyanna Mctier, PA-C 08/16/23  ADDENDUM: Hematology/Oncology Attending: I had a face-to-face encounter with the patient today.  I reviewed her records, lab and recommended her care plan.  This is a very pleasant and probably demented 53 years old white female with history of stage IV malignant melanoma diagnosed in July 2019 with peritoneal and CNS metastasis.  Her diagnosis was based on subcutaneous nodule biopsy that confirmed the presence of metastatic melanoma she is status post whole brain irradiation.  Her tumor was positive for BRAF mutations and the patient was treated with [facility]  and [name] between August 2019 and November 2019 with partial response.  She was then treated with 12 cycles of immunotherapy with Keytruda 1 cycle every 3 weeks.  She has been in observation since July 2020.  The patient was seen in the past by Dr. Clelia Croft before he left the practice.  She was supposed to see [place] several months ago but she missed her appointment.  She presented today  for the first time evaluation with me.  She has no complaints except for the dementia.  She has history of a stroke and she is a resident at a skilled nursing facility. I had a lengthy discussion with the patient and her caregiver today about her condition. I recommended for her to have a PET scan as well as MRI of the brain to rule out any metastatic disease. Will see the patient back for follow-up visit in around 3 weeks for discussion of her imaging studies and treatment options if needed. She was advised to call immediately if she has any other concerning issues in the interval. The total time spent in the appointment was 30 minutes. Disclaimer: This note was dictated with voice recognition software. Similar sounding words can inadvertently be transcribed and may be missed upon review. Lajuana Matte, MD

## 2023-08-16 ENCOUNTER — Inpatient Hospital Stay: Payer: Medicare Other | Attending: Internal Medicine

## 2023-08-16 ENCOUNTER — Inpatient Hospital Stay (HOSPITAL_BASED_OUTPATIENT_CLINIC_OR_DEPARTMENT_OTHER): Payer: Medicare Other | Admitting: Physician Assistant

## 2023-08-16 VITALS — BP 135/94 | Temp 88.0°F | Resp 16 | Wt 114.8 lb

## 2023-08-16 DIAGNOSIS — R059 Cough, unspecified: Secondary | ICD-10-CM | POA: Diagnosis not present

## 2023-08-16 DIAGNOSIS — F209 Schizophrenia, unspecified: Secondary | ICD-10-CM | POA: Insufficient documentation

## 2023-08-16 DIAGNOSIS — Z9221 Personal history of antineoplastic chemotherapy: Secondary | ICD-10-CM | POA: Insufficient documentation

## 2023-08-16 DIAGNOSIS — Z85820 Personal history of malignant melanoma of skin: Secondary | ICD-10-CM | POA: Insufficient documentation

## 2023-08-16 DIAGNOSIS — Z923 Personal history of irradiation: Secondary | ICD-10-CM | POA: Insufficient documentation

## 2023-08-16 DIAGNOSIS — C439 Malignant melanoma of skin, unspecified: Secondary | ICD-10-CM

## 2023-08-16 LAB — CBC WITH DIFFERENTIAL/PLATELET
Abs Immature Granulocytes: 0.01 10*3/uL (ref 0.00–0.07)
Basophils Absolute: 0 10*3/uL (ref 0.0–0.1)
Basophils Relative: 0 %
Eosinophils Absolute: 0 10*3/uL (ref 0.0–0.5)
Eosinophils Relative: 0 %
HCT: 36.3 % (ref 36.0–46.0)
Hemoglobin: 12 g/dL (ref 12.0–15.0)
Immature Granulocytes: 0 %
Lymphocytes Relative: 47 %
Lymphs Abs: 1.2 10*3/uL (ref 0.7–4.0)
MCH: 26.9 pg (ref 26.0–34.0)
MCHC: 33.1 g/dL (ref 30.0–36.0)
MCV: 81.4 fL (ref 80.0–100.0)
Monocytes Absolute: 0.3 10*3/uL (ref 0.1–1.0)
Monocytes Relative: 13 %
Neutro Abs: 1 10*3/uL — ABNORMAL LOW (ref 1.7–7.7)
Neutrophils Relative %: 40 %
Platelets: 56 10*3/uL — ABNORMAL LOW (ref 150–400)
RBC: 4.46 MIL/uL (ref 3.87–5.11)
RDW: 12.6 % (ref 11.5–15.5)
WBC: 2.6 10*3/uL — ABNORMAL LOW (ref 4.0–10.5)
nRBC: 0 % (ref 0.0–0.2)

## 2023-08-16 LAB — COMPREHENSIVE METABOLIC PANEL
ALT: 9 U/L (ref 0–44)
AST: 21 U/L (ref 15–41)
Albumin: 4 g/dL (ref 3.5–5.0)
Alkaline Phosphatase: 72 U/L (ref 38–126)
Anion gap: 6 (ref 5–15)
BUN: 15 mg/dL (ref 6–20)
CO2: 28 mmol/L (ref 22–32)
Calcium: 8.7 mg/dL — ABNORMAL LOW (ref 8.9–10.3)
Chloride: 95 mmol/L — ABNORMAL LOW (ref 98–111)
Creatinine, Ser: 0.6 mg/dL (ref 0.44–1.00)
GFR, Estimated: >60 mL/min (ref >60)
Glucose, Bld: 82 mg/dL (ref 70–99)
Potassium: 3.4 mmol/L — ABNORMAL LOW (ref 3.5–5.1)
Sodium: 129 mmol/L — ABNORMAL LOW (ref 135–145)
Total Bilirubin: 0.3 mg/dL (ref 0.0–1.2)
Total Protein: 6.8 g/dL (ref 6.5–8.1)

## 2023-08-24 ENCOUNTER — Encounter (HOSPITAL_COMMUNITY): Payer: Self-pay

## 2023-08-24 ENCOUNTER — Ambulatory Visit (HOSPITAL_COMMUNITY): Admission: RE | Admit: 2023-08-24 | Payer: Medicare Other | Source: Ambulatory Visit

## 2023-08-25 ENCOUNTER — Telehealth: Payer: Self-pay | Admitting: Medical Oncology

## 2023-08-25 NOTE — Telephone Encounter (Signed)
Scans Cancelled-Called and spoke to pts nurse at [facility]. She said pt is" very uncooperative and unwilling to get her scans".    I told nurse to tell pt she will need her scans before she sees Mohamed andwe will cancel her scans and follow up.   I told nurse to tell pt she needs to call us back to r/s her appts.  She said [facility] does not have a phone and she will give her my message.

## 2023-08-26 ENCOUNTER — Ambulatory Visit (HOSPITAL_COMMUNITY): Payer: Medicare Other

## 2023-09-04 ENCOUNTER — Emergency Department (HOSPITAL_COMMUNITY)
Admission: EM | Admit: 2023-09-04 | Discharge: 2023-09-05 | Disposition: A | Discharge status: Skilled Nursing Facility | Attending: Emergency Medicine | Admitting: Emergency Medicine

## 2023-09-04 ENCOUNTER — Encounter (HOSPITAL_COMMUNITY): Payer: Self-pay

## 2023-09-04 ENCOUNTER — Other Ambulatory Visit: Payer: Self-pay

## 2023-09-04 ENCOUNTER — Emergency Department (HOSPITAL_COMMUNITY)

## 2023-09-04 DIAGNOSIS — S0990XA Unspecified injury of head, initial encounter: Secondary | ICD-10-CM | POA: Diagnosis present

## 2023-09-04 DIAGNOSIS — W01198A Fall on same level from slipping, tripping and stumbling with subsequent striking against other object, initial encounter: Secondary | ICD-10-CM | POA: Insufficient documentation

## 2023-09-04 DIAGNOSIS — Z85828 Personal history of other malignant neoplasm of skin: Secondary | ICD-10-CM | POA: Insufficient documentation

## 2023-09-04 DIAGNOSIS — M25511 Pain in right shoulder: Secondary | ICD-10-CM | POA: Diagnosis not present

## 2023-09-04 DIAGNOSIS — Z85841 Personal history of malignant neoplasm of brain: Secondary | ICD-10-CM | POA: Insufficient documentation

## 2023-09-04 NOTE — ED Notes (Signed)
Pt ambulated to restroom with 2 person assist. 

## 2023-09-04 NOTE — ED Notes (Signed)
 [facility] called for transport.

## 2023-09-04 NOTE — ED Provider Notes (Signed)
 [place] EMERGENCY DEPARTMENT AT [facility] Provider Note   CSN: [id number] Arrival date & time: 09/04/23  1826     History  Chief Complaint  Patient presents with   [facility] Christenson is a 53 y.o. female.  The history is provided by the patient and medical records. No language interpreter was used.  Fall     53 year old female history of prior stroke, skin cancer with metastatic to brain, schizophrenia brought here via EMS from [facility] for evaluation for fall.  Patient states she was using her walker to walk back to her room.  States she was trying to walk backward because she normally used a body to push the door unfortunately the door was open and she lost control fell backwards striking the back of her head against the ground.  Incident happened earlier in the day.  She denies any loss of consciousness.  Shortly after the fall several residents was able to help her up.  She endorsed some tenderness to the back of her head and neck as well as the right shoulder.  Right shoulder pain has since resolved without any specific treatment.  Headache is mild.  She denies any confusion dizziness nausea vomiting focal numbness or focal weakness.  She denies any precipitating symptoms prior to the fall no chest pain or trouble breathing no abdominal pain and she is not on any blood thinner medication.  She denies any urine symptoms.  Home Medications Prior to Admission medications   Medication Sig Start Date End Date Taking? Authorizing Provider  linaclotide Karlene Einstein) 290 MCG CAPS capsule Take 1 capsule (290 mcg total) by mouth daily before breakfast. 07/21/22   [name], MD  risperiDONE (RISPERDAL) 1 MG tablet Take 0.5 tablets (0.5 mg total) by mouth 2 (two) times daily. 08/12/22   Glade Lloyd, MD  rosuvastatin (CRESTOR) 40 MG tablet Take 1 tablet (40 mg total) by mouth daily. 06/14/22   Rolan Bucco, MD      Allergies    Patient has no known  allergies.    Review of Systems   Review of Systems  All other systems reviewed and are negative.   Physical Exam Updated Vital Signs BP 123/86 (BP Location: Left Arm)   Pulse 75   Temp 97.8 F (36.6 C) (Oral)   Resp 16   SpO2 99%  Physical Exam Vitals and nursing note reviewed.  Constitutional:      General: She is not in acute distress.    Appearance: She is well-developed.  HENT:     Head: Normocephalic and atraumatic.     Comments: Mild tenderness to occipital scalp without any bruising or laceration noted. Eyes:     Conjunctiva/sclera: Conjunctivae normal.  Neck:     Comments: C-collar in place, mild tenderness to cervical paraspinal muscles, no midline spine tenderness Cardiovascular:     Rate and Rhythm: Normal rate and regular rhythm.     Pulses: Normal pulses.     Heart sounds: Normal heart sounds.  Pulmonary:     Effort: Pulmonary effort is normal.  Chest:     Chest wall: No tenderness.  Abdominal:     Palpations: Abdomen is soft.     Tenderness: There is no abdominal tenderness.  Musculoskeletal:     Cervical back: Neck supple.     Comments: No midline spine tenderness.  No tenderness about the right shoulder.  Patient able to move both arms without any difficulty.  No tenderness  to hips knees or ankles.  Strength is equal throughout.  Skin:    Findings: No rash.  Neurological:     Mental Status: She is alert.     Comments: Alert and oriented to self and place.  Psychiatric:        Mood and Affect: Mood normal.     ED Results / Procedures / Treatments   Labs (all labs ordered are listed, but only abnormal results are displayed) Labs Reviewed - No data to display  EKG None  Radiology CT Cervical Spine Wo Contrast Result Date: 09/04/2023 CLINICAL DATA:  Status post fall. EXAM: CT CERVICAL SPINE WITHOUT CONTRAST TECHNIQUE: Multidetector CT imaging of the cervical spine was performed without intravenous contrast. Multiplanar CT image  reconstructions were also generated. RADIATION DOSE REDUCTION: This exam was performed according to the departmental dose-optimization program which includes automated exposure control, adjustment of the mA and/or kV according to patient size and/or use of iterative reconstruction technique. COMPARISON:  June 22, 2023 FINDINGS: Alignment: Normal. Skull base and vertebrae: No acute fracture. No primary bone lesion or focal pathologic process. Chronic and degenerative changes are seen involving the tip of the dens and the adjacent portion of the anterior arch of C1. Soft tissues and spinal canal: No prevertebral fluid or swelling. No visible canal hematoma. Disc levels: Moderate to marked severity endplate sclerosis, anterior osteophyte formation and posterior bony spurring are seen at the levels of C5-C6 and C6-C7. Mild intervertebral disc space narrowing is also seen at C5-C6 and C6-C7. Bilateral marked severity multilevel facet joint hypertrophy is noted. Upper chest: Negative. Other: There is moderate severity left maxillary sinus mucosal thickening. IMPRESSION: 1. No acute fracture or subluxation in the cervical spine. 2. Moderate to marked severity degenerative changes, most notable at the levels of C5-C6 and C6-C7. Electronically Signed   By: Aram Candela M.D.   On: 09/04/2023 19:34   CT Head Wo Contrast Result Date: 09/04/2023 CLINICAL DATA:  Status post fall. EXAM: CT HEAD WITHOUT CONTRAST TECHNIQUE: Contiguous axial images were obtained from the base of the skull through the vertex without intravenous contrast. RADIATION DOSE REDUCTION: This exam was performed according to the departmental dose-optimization program which includes automated exposure control, adjustment of the mA and/or kV according to patient size and/or use of iterative reconstruction technique. COMPARISON:  June 22, 2023 FINDINGS: Brain: There is stable, age advanced cerebral atrophy with widening of the extra-axial spaces  and ventricular dilatation. There are areas of decreased attenuation within the white matter tracts of the supratentorial brain, consistent with microvascular disease changes. Vascular: No hyperdense vessel or unexpected calcification. Skull: Normal. Negative for fracture or focal lesion. Sinuses/Orbits: No acute finding. Other: None. IMPRESSION: 1. No acute intracranial abnormality. 2. Stable, age advanced cerebral atrophy with chronic white matter small vessel ischemic changes. Electronically Signed   By: Aram Candela M.D.   On: 09/04/2023 19:32    Procedures Procedures    Medications Ordered in ED Medications - No data to display  ED Course/ Medical Decision Making/ A&P                                 Medical Decision Making Amount and/or Complexity of Data Reviewed Radiology: ordered.   BP 123/86 (BP Location: Left Arm)   Pulse 75   Temp 97.8 F (36.6 C) (Oral)   Resp 16   SpO2 99%   24:25 PM  53 year old female history of  prior stroke, skin cancer with metastatic to brain, schizophrenia brought here via EMS from [facility] for evaluation for fall.  Patient states she was using her walker to walk back to her room.  States she was trying to walk backward because she normally used a body to push the door unfortunately the door was open and she lost control fell backwards striking the back of her head against the ground.  Incident happened earlier in the day.  She denies any loss of consciousness.  Shortly after the fall several residents was able to help her up.  She endorsed some tenderness to the back of her head and neck as well as the right shoulder.  Right shoulder pain has since resolved without any specific treatment.  Headache is mild.  She denies any confusion dizziness nausea vomiting focal numbness or focal weakness.  She denies any precipitating symptoms prior to the fall no chest pain or trouble breathing no abdominal pain and she is not on any blood thinner  medication.  She denies any urine symptoms.  Exam notable for mild tenderness to the occipital scalp and cervical paraspinal muscles but otherwise no concerning injury noted.  No focal deficit.  Will obtain CT scan of the head and cervical spine for further assessment.  I offered pain medication but patient declined.  8:33 PM CT scanning of the head and cervical spine was obtained independently viewed interpreted by me and is negative for any acute fracture or dislocation or any internal injury.  Agree with radiology interpretation.  Patient does have some chronic arthritic changes.  I removed the cervical collar and patient is able to range her neck without difficulty.  At this time she is stable for discharge.  She use a walker to walk and is able to ambulate.  I have notified patient's legal guardian of the status of patient.  At this time she is stable for discharge.  DDx: Contusion, fracture, dislocation, intracranial injury.  Encourage patient to take over-the-counter Tylenol or ibuprofen as needed for aches and pain.        Final Clinical Impression(s) / ED Diagnoses Final diagnoses:  Minor head injury, initial encounter    Rx / DC Orders ED Discharge Orders     None         Fayrene Helper, PA-C 09/04/23 2044    Charlynne Pander, MD 09/04/23 [phone]

## 2023-09-04 NOTE — Discharge Instructions (Addendum)
 You have been evaluated for your fall.  Fortunately CT scan of your head and cervical spine did not show any significant injury.  You may take over-the-counter Tylenol or ibuprofen as needed for aches and pain.  Follow-up closely with your doctor for further care.  Return if you have any concerns.

## 2023-09-04 NOTE — ED Triage Notes (Signed)
 Pt BIB EMS from [facility] due to a mechanical fall backwards hitting back of head; pt was using walker when she fell. No blood thinners. No LOC. Pt reports pain to top of head, mid back, and right shoulder. Baseline pt ambulates with walker.  In Route BP 128/82 HR 74 CBG 127 SpO2 97%

## 2023-09-05 NOTE — ED Notes (Signed)
Pt. Assisted to bathroom in wheelchair for safety. 

## 2023-09-09 ENCOUNTER — Telehealth: Payer: Self-pay | Admitting: Physician Assistant

## 2023-09-09 NOTE — Telephone Encounter (Signed)
 I am scheduled to see the patient next week on 09/14/2023.  The patient is supposed to have repeat brain MRI and PET scan prior to her appointment.  The patient has not had these test performed.  The patient has schizophrenia and she is a resident of [name].  I called [facility] to follow-up.  The patient apparently refused her PET scan and brain MRI.  They will try and talk to the patient today about rescheduling this.  I gave them the number to radiology scheduling to help get this scheduled.  I have canceled her appointment on 09/14/2023 until her PET scan and brain MRI can be performed and we would arrange for follow-up visit in the office approximately 7 to 10 days later.

## 2023-09-09 NOTE — Progress Notes (Deleted)
 [facility] OFFICE PROGRESS NOTE  [name], MD [address] [place] [address]  DIAGNOSIS: stage IV melanoma diagnosed in July 2019. She presented with peritoneal disease and CNS involvement without known primary.   PRIOR THERAPY:  CURRENT THERAPY:  INTERVAL HISTORY: Kaitlyn Duncan 53 y.o. female returns for *** regular *** visit for followup of ***   MEDICAL HISTORY: Past Medical History:  Diagnosis Date   Allergy    Anemia    Gallstones    GERD (gastroesophageal reflux disease)    Hemorrhoids    melanoma with met dz dx'd 01/2018   brain and adrenal   Seasonal allergies    Stroke (HCC)     ALLERGIES:  has no known allergies.  MEDICATIONS:  Current Outpatient Medications  Medication Sig Dispense Refill   linaclotide (LINZESS) 290 MCG CAPS capsule Take 1 capsule (290 mcg total) by mouth daily before breakfast. 90 capsule 1   risperiDONE (RISPERDAL) 1 MG tablet Take 0.5 tablets (0.5 mg total) by mouth 2 (two) times daily.     rosuvastatin (CRESTOR) 40 MG tablet Take 1 tablet (40 mg total) by mouth daily. 30 tablet 3   No current facility-administered medications for this visit.    SURGICAL HISTORY:  Past Surgical History:  Procedure Laterality Date   BIOPSY  01/28/2018   Procedure: BIOPSY;  Surgeon: Carman Ching, MD;  Location: [facility];  Service: Endoscopy;;   BREAST BIOPSY Left    ESOPHAGOGASTRODUODENOSCOPY N/A 01/28/2018   Procedure: ESOPHAGOGASTRODUODENOSCOPY (EGD);  Surgeon: Carman Ching, MD;  Location: [facility];  Service: Endoscopy;  Laterality: N/A;   MOUTH SURGERY     OVARY SURGERY     removed "something"    REVIEW OF SYSTEMS:   Review of Systems  Constitutional: Negative for appetite change, chills, fatigue, fever and unexpected weight change.  HENT:   Negative for mouth sores, nosebleeds, sore throat and trouble swallowing.   Eyes: Negative for eye problems and icterus.  Respiratory: Negative for  cough, hemoptysis, shortness of breath and wheezing.   Cardiovascular: Negative for chest pain and leg swelling.  Gastrointestinal: Negative for abdominal pain, constipation, diarrhea, nausea and vomiting.  Genitourinary: Negative for bladder incontinence, difficulty urinating, dysuria, frequency and hematuria.   Musculoskeletal: Negative for back pain, gait problem, neck pain and neck stiffness.  Skin: Negative for itching and rash.  Neurological: Negative for dizziness, extremity weakness, gait problem, headaches, light-headedness and seizures.  Hematological: Negative for adenopathy. Does not bruise/bleed easily.  Psychiatric/Behavioral: Negative for confusion, depression and sleep disturbance. The patient is not nervous/anxious.     PHYSICAL EXAMINATION:  There were no vitals taken for this visit.  ECOG PERFORMANCE STATUS: {CHL ONC ECOG [id number]  Physical Exam  Constitutional: Oriented to person, place, and time and well-developed, well-nourished, and in no distress. No distress.  HENT:  Head: Normocephalic and atraumatic.  Mouth/Throat: Oropharynx is clear and moist. No oropharyngeal exudate.  Eyes: Conjunctivae are normal. Right eye exhibits no discharge. Left eye exhibits no discharge. No scleral icterus.  Neck: Normal range of motion. Neck supple.  Cardiovascular: Normal rate, regular rhythm, normal heart sounds and intact distal pulses.   Pulmonary/Chest: Effort normal and breath sounds normal. No respiratory distress. No wheezes. No rales.  Abdominal: Soft. Bowel sounds are normal. Exhibits no distension and no mass. There is no tenderness.  Musculoskeletal: Normal range of motion. Exhibits no edema.  Lymphadenopathy:    No cervical adenopathy.  Neurological: Alert and oriented to person, place, and  time. Exhibits normal muscle tone. Gait normal. Coordination normal.  Skin: Skin is warm and dry. No rash noted. Not diaphoretic. No erythema. No pallor.  Psychiatric: Mood,  memory and judgment normal.  Vitals reviewed.  LABORATORY DATA: Lab Results  Component Value Date   WBC 2.6 (L) 08/16/2023   HGB 12.0 08/16/2023   HCT 36.3 08/16/2023   MCV 81.4 08/16/2023   PLT 56 (L) 08/16/2023      Chemistry      Component Value Date/Time   NA 129 (L) 08/16/2023 0926   K 3.4 (L) 08/16/2023 0926   CL 95 (L) 08/16/2023 0926   CO2 28 08/16/2023 0926   BUN 15 08/16/2023 0926   CREATININE 0.60 08/16/2023 0926   CREATININE 0.66 02/02/2022 1207      Component Value Date/Time   CALCIUM 8.7 (L) 08/16/2023 0926   ALKPHOS 72 08/16/2023 0926   AST 21 08/16/2023 0926   AST 24 02/02/2022 1207   ALT 9 08/16/2023 0926   ALT 17 02/02/2022 1207   BILITOT 0.3 08/16/2023 0926   BILITOT 0.4 02/02/2022 1207       RADIOGRAPHIC STUDIES:  CT Cervical Spine Wo Contrast Result Date: 09/04/2023 CLINICAL DATA:  Status post fall. EXAM: CT CERVICAL SPINE WITHOUT CONTRAST TECHNIQUE: Multidetector CT imaging of the cervical spine was performed without intravenous contrast. Multiplanar CT image reconstructions were also generated. RADIATION DOSE REDUCTION: This exam was performed according to the departmental dose-optimization program which includes automated exposure control, adjustment of the mA and/or kV according to patient size and/or use of iterative reconstruction technique. COMPARISON:  June 22, 2023 FINDINGS: Alignment: Normal. Skull base and vertebrae: No acute fracture. No primary bone lesion or focal pathologic process. Chronic and degenerative changes are seen involving the tip of the dens and the adjacent portion of the anterior arch of C1. Soft tissues and spinal canal: No prevertebral fluid or swelling. No visible canal hematoma. Disc levels: Moderate to marked severity endplate sclerosis, anterior osteophyte formation and posterior bony spurring are seen at the levels of C5-C6 and C6-C7. Mild intervertebral disc space narrowing is also seen at C5-C6 and C6-C7. Bilateral  marked severity multilevel facet joint hypertrophy is noted. Upper chest: Negative. Other: There is moderate severity left maxillary sinus mucosal thickening. IMPRESSION: 1. No acute fracture or subluxation in the cervical spine. 2. Moderate to marked severity degenerative changes, most notable at the levels of C5-C6 and C6-C7. Electronically Signed   By: Aram Candela M.D.   On: 09/04/2023 19:34   CT Head Wo Contrast Result Date: 09/04/2023 CLINICAL DATA:  Status post fall. EXAM: CT HEAD WITHOUT CONTRAST TECHNIQUE: Contiguous axial images were obtained from the base of the skull through the vertex without intravenous contrast. RADIATION DOSE REDUCTION: This exam was performed according to the departmental dose-optimization program which includes automated exposure control, adjustment of the mA and/or kV according to patient size and/or use of iterative reconstruction technique. COMPARISON:  June 22, 2023 FINDINGS: Brain: There is stable, age advanced cerebral atrophy with widening of the extra-axial spaces and ventricular dilatation. There are areas of decreased attenuation within the white matter tracts of the supratentorial brain, consistent with microvascular disease changes. Vascular: No hyperdense vessel or unexpected calcification. Skull: Normal. Negative for fracture or focal lesion. Sinuses/Orbits: No acute finding. Other: None. IMPRESSION: 1. No acute intracranial abnormality. 2. Stable, age advanced cerebral atrophy with chronic white matter small vessel ischemic changes. Electronically Signed   By: Aram Candela M.D.   On: 09/04/2023 19:32  ASSESSMENT/PLAN:  No problem-specific Assessment & Plan notes found for this encounter.   No orders of the defined types were placed in this encounter.    I spent {CHL ONC TIME VISIT - WUJWJ:[id number]} counseling the patient face to face. The total time spent in the appointment was {CHL ONC TIME VISIT - OZHYQ:[id number]}.  Wynne Rozak L  Dominic Rhome, PA-C 09/09/23

## 2023-09-12 ENCOUNTER — Telehealth: Payer: Self-pay | Admitting: Internal Medicine

## 2023-09-14 ENCOUNTER — Ambulatory Visit: Payer: Medicare Other | Admitting: Physician Assistant

## 2023-09-14 ENCOUNTER — Other Ambulatory Visit: Payer: Medicare Other

## 2023-09-20 ENCOUNTER — Ambulatory Visit (HOSPITAL_COMMUNITY)

## 2023-09-26 ENCOUNTER — Ambulatory Visit (HOSPITAL_COMMUNITY)

## 2023-09-26 ENCOUNTER — Ambulatory Visit (HOSPITAL_COMMUNITY): Admission: RE | Admit: 2023-09-26 | Source: Ambulatory Visit

## 2023-09-30 ENCOUNTER — Other Ambulatory Visit: Payer: Self-pay

## 2023-09-30 DIAGNOSIS — C439 Malignant melanoma of skin, unspecified: Secondary | ICD-10-CM

## 2023-10-03 ENCOUNTER — Other Ambulatory Visit

## 2023-10-03 ENCOUNTER — Ambulatory Visit: Admitting: Internal Medicine

## 2023-10-03 ENCOUNTER — Encounter (HOSPITAL_COMMUNITY): Payer: Self-pay

## 2023-10-03 ENCOUNTER — Ambulatory Visit (HOSPITAL_COMMUNITY)

## 2023-10-03 ENCOUNTER — Ambulatory Visit (HOSPITAL_COMMUNITY): Admission: RE | Admit: 2023-10-03 | Source: Ambulatory Visit

## 2023-10-04 ENCOUNTER — Telehealth: Payer: Self-pay | Admitting: Internal Medicine

## 2023-10-04 ENCOUNTER — Telehealth: Payer: Self-pay | Admitting: Medical Oncology

## 2023-10-04 ENCOUNTER — Telehealth (HOSPITAL_COMMUNITY): Payer: Self-pay | Admitting: Internal Medicine

## 2023-10-04 NOTE — Telephone Encounter (Signed)
 Appointments,.-Spoke to April @ [name] to schedule pt for f/u scans and appts.   April said Daaiyah  has refused all appointments multiple times. I asked her to have pt call me so I can talk to pt .

## 2023-10-04 NOTE — Telephone Encounter (Signed)
 10/04/23~Cncl & DO NOT SCHD NM PET & MRI appts, per patient REFUSAL & NO SHOW 4 appts, per Unit Mgr April via [facility] @ [phone].  MF

## 2023-10-04 NOTE — Telephone Encounter (Signed)
 April connected me to [place] and pt said  : " I told you I am not doing anymore scans". April  said pt does not want to keep anymore appts and cancelled her future appts  .  Cristina is refusing all her meds .    April will call back the cancer center if pt changes her mind.   Appts cancelled.

## 2023-10-04 NOTE — Telephone Encounter (Signed)
 Was unable to leave a voicemail. The patient was scheduled for labs and a follow up for scan review. The patient is active on MyChart and will be mailed an appointment reminder.

## 2023-11-02 ENCOUNTER — Encounter (HOSPITAL_COMMUNITY): Payer: Self-pay | Admitting: Internal Medicine

## 2023-11-02 ENCOUNTER — Other Ambulatory Visit: Payer: Self-pay

## 2023-11-02 ENCOUNTER — Inpatient Hospital Stay (HOSPITAL_COMMUNITY)
Admission: EM | Admit: 2023-11-02 | Discharge: 2023-11-10 | DRG: 644 | Disposition: A | Discharge status: Skilled Nursing Facility | Payer: Medicare (Managed Care) | Attending: Internal Medicine | Admitting: Internal Medicine

## 2023-11-02 ENCOUNTER — Emergency Department (HOSPITAL_COMMUNITY): Payer: Medicare (Managed Care)

## 2023-11-02 DIAGNOSIS — Z91199 Patient's noncompliance with other medical treatment and regimen due to unspecified reason: Secondary | ICD-10-CM | POA: Diagnosis not present

## 2023-11-02 DIAGNOSIS — K219 Gastro-esophageal reflux disease without esophagitis: Secondary | ICD-10-CM | POA: Diagnosis present

## 2023-11-02 DIAGNOSIS — D696 Thrombocytopenia, unspecified: Secondary | ICD-10-CM

## 2023-11-02 DIAGNOSIS — F22 Delusional disorders: Secondary | ICD-10-CM | POA: Diagnosis not present

## 2023-11-02 DIAGNOSIS — Z803 Family history of malignant neoplasm of breast: Secondary | ICD-10-CM

## 2023-11-02 DIAGNOSIS — C7931 Secondary malignant neoplasm of brain: Secondary | ICD-10-CM | POA: Diagnosis present

## 2023-11-02 DIAGNOSIS — E2749 Other adrenocortical insufficiency: Secondary | ICD-10-CM | POA: Diagnosis present

## 2023-11-02 DIAGNOSIS — M25561 Pain in right knee: Secondary | ICD-10-CM | POA: Diagnosis present

## 2023-11-02 DIAGNOSIS — Z85820 Personal history of malignant melanoma of skin: Secondary | ICD-10-CM

## 2023-11-02 DIAGNOSIS — Z8249 Family history of ischemic heart disease and other diseases of the circulatory system: Secondary | ICD-10-CM

## 2023-11-02 DIAGNOSIS — Z9221 Personal history of antineoplastic chemotherapy: Secondary | ICD-10-CM

## 2023-11-02 DIAGNOSIS — Z808 Family history of malignant neoplasm of other organs or systems: Secondary | ICD-10-CM

## 2023-11-02 DIAGNOSIS — M545 Low back pain, unspecified: Secondary | ICD-10-CM | POA: Diagnosis present

## 2023-11-02 DIAGNOSIS — Z9181 History of falling: Secondary | ICD-10-CM

## 2023-11-02 DIAGNOSIS — Z923 Personal history of irradiation: Secondary | ICD-10-CM | POA: Diagnosis not present

## 2023-11-02 DIAGNOSIS — F203 Undifferentiated schizophrenia: Secondary | ICD-10-CM | POA: Diagnosis present

## 2023-11-02 DIAGNOSIS — Z833 Family history of diabetes mellitus: Secondary | ICD-10-CM | POA: Diagnosis not present

## 2023-11-02 DIAGNOSIS — Z91148 Patient's other noncompliance with medication regimen for other reason: Secondary | ICD-10-CM

## 2023-11-02 DIAGNOSIS — M25551 Pain in right hip: Secondary | ICD-10-CM | POA: Diagnosis present

## 2023-11-02 DIAGNOSIS — M8580 Other specified disorders of bone density and structure, unspecified site: Secondary | ICD-10-CM | POA: Diagnosis present

## 2023-11-02 DIAGNOSIS — E876 Hypokalemia: Secondary | ICD-10-CM | POA: Diagnosis present

## 2023-11-02 DIAGNOSIS — D630 Anemia in neoplastic disease: Secondary | ICD-10-CM | POA: Diagnosis present

## 2023-11-02 DIAGNOSIS — E860 Dehydration: Secondary | ICD-10-CM | POA: Diagnosis present

## 2023-11-02 DIAGNOSIS — Z8673 Personal history of transient ischemic attack (TIA), and cerebral infarction without residual deficits: Secondary | ICD-10-CM | POA: Diagnosis not present

## 2023-11-02 DIAGNOSIS — R531 Weakness: Secondary | ICD-10-CM

## 2023-11-02 DIAGNOSIS — R296 Repeated falls: Secondary | ICD-10-CM | POA: Diagnosis present

## 2023-11-02 DIAGNOSIS — E871 Hypo-osmolality and hyponatremia: Secondary | ICD-10-CM | POA: Diagnosis not present

## 2023-11-02 DIAGNOSIS — F209 Schizophrenia, unspecified: Secondary | ICD-10-CM | POA: Diagnosis not present

## 2023-11-02 DIAGNOSIS — D61818 Other pancytopenia: Secondary | ICD-10-CM | POA: Diagnosis present

## 2023-11-02 DIAGNOSIS — F29 Unspecified psychosis not due to a substance or known physiological condition: Secondary | ICD-10-CM | POA: Diagnosis present

## 2023-11-02 DIAGNOSIS — Z5320 Procedure and treatment not carried out because of patient's decision for unspecified reasons: Secondary | ICD-10-CM | POA: Diagnosis not present

## 2023-11-02 DIAGNOSIS — Z79899 Other long term (current) drug therapy: Secondary | ICD-10-CM

## 2023-11-02 LAB — CBC
HCT: 33 % — ABNORMAL LOW (ref 36.0–46.0)
Hemoglobin: 11.1 g/dL — ABNORMAL LOW (ref 12.0–15.0)
MCH: 27.1 pg (ref 26.0–34.0)
MCHC: 33.6 g/dL (ref 30.0–36.0)
MCV: 80.5 fL (ref 80.0–100.0)
Platelets: 67 10*3/uL — ABNORMAL LOW (ref 150–400)
RBC: 4.1 MIL/uL (ref 3.87–5.11)
RDW: 12.3 % (ref 11.5–15.5)
WBC: 4.2 10*3/uL (ref 4.0–10.5)
nRBC: 0 % (ref 0.0–0.2)

## 2023-11-02 LAB — URINALYSIS, ROUTINE W REFLEX MICROSCOPIC
Bilirubin Urine: NEGATIVE
Glucose, UA: NEGATIVE mg/dL
Hgb urine dipstick: NEGATIVE
Ketones, ur: NEGATIVE mg/dL
Leukocytes,Ua: NEGATIVE
Nitrite: NEGATIVE
Protein, ur: NEGATIVE mg/dL
Specific Gravity, Urine: 1.004 — ABNORMAL LOW (ref 1.005–1.030)
pH: 8 (ref 5.0–8.0)

## 2023-11-02 LAB — BASIC METABOLIC PANEL WITH GFR
Anion gap: 10 (ref 5–15)
BUN: 10 mg/dL (ref 6–20)
CO2: 21 mmol/L — ABNORMAL LOW (ref 22–32)
Calcium: 8.9 mg/dL (ref 8.9–10.3)
Chloride: 89 mmol/L — ABNORMAL LOW (ref 98–111)
Creatinine, Ser: 0.38 mg/dL — ABNORMAL LOW (ref 0.44–1.00)
GFR, Estimated: >60 mL/min (ref >60)
Glucose, Bld: 94 mg/dL (ref 70–99)
Potassium: 3.4 mmol/L — ABNORMAL LOW (ref 3.5–5.1)
Sodium: 120 mmol/L — ABNORMAL LOW (ref 135–145)

## 2023-11-02 LAB — NA AND K (SODIUM & POTASSIUM), RAND UR
Potassium Urine: 20 mmol/L
Sodium, Ur: 73 mmol/L

## 2023-11-02 LAB — TSH: TSH: 1.223 u[IU]/mL (ref 0.350–4.500)

## 2023-11-02 LAB — SODIUM: Sodium: 124 mmol/L — ABNORMAL LOW (ref 135–145)

## 2023-11-02 MED ORDER — ONDANSETRON HCL 4 MG/2ML IJ SOLN: 4.0000 mg | Freq: Four times a day (QID) | INTRAMUSCULAR | Status: DC | PRN

## 2023-11-02 MED ORDER — ACETAMINOPHEN 325 MG PO TABS
650.0000 mg | ORAL_TABLET | Freq: Four times a day (QID) | ORAL | Status: DC | PRN
  Filled 2023-11-02: qty 2

## 2023-11-02 MED ORDER — ONDANSETRON HCL 4 MG PO TABS: 4.0000 mg | ORAL_TABLET | Freq: Four times a day (QID) | ORAL | Status: DC | PRN

## 2023-11-02 MED ORDER — MORPHINE SULFATE (PF) 2 MG/ML IV SOLN
2.0000 mg | Freq: Once | INTRAVENOUS | Status: AC
  Administered 2023-11-02: 2 mg via INTRAVENOUS
  Filled 2023-11-02: qty 1

## 2023-11-02 MED ORDER — ONDANSETRON HCL 4 MG/2ML IJ SOLN
4.0000 mg | Freq: Once | INTRAMUSCULAR | Status: AC
  Administered 2023-11-02: 4 mg via INTRAVENOUS
  Filled 2023-11-02: qty 2

## 2023-11-02 MED ORDER — SODIUM CHLORIDE 0.9 % IV BOLUS
1000.0000 mL | Freq: Once | INTRAVENOUS | Status: AC
  Administered 2023-11-02: 1000 mL via INTRAVENOUS

## 2023-11-02 MED ORDER — SODIUM CHLORIDE 0.9% FLUSH
3.0000 mL | Freq: Two times a day (BID) | INTRAVENOUS | Status: DC
  Administered 2023-11-02 – 2023-11-07 (×12): 3 mL via INTRAVENOUS

## 2023-11-02 MED ORDER — ACETAMINOPHEN 650 MG RE SUPP: 650.0000 mg | Freq: Four times a day (QID) | RECTAL | Status: DC | PRN

## 2023-11-02 NOTE — ED Triage Notes (Signed)
 Pt has had 2 falls within 1 week. EMS states the pt has not taken her meds in 6 months per facility staff. Pt is alert and able to answer questions appropriately. Pt denies hitting her head or LOC. Pt is complaining of right elbow and right knee pain.

## 2023-11-02 NOTE — H&P (Signed)
 History and Physical    Patient: Kaitlyn Duncan WUJ:[id number] DOB: Sep 07, 1970 DOA: 11/02/2023 DOS: the patient was seen and examined on 11/02/2023 PCP: Adelia Homestead, MD  Patient coming from: [facility]  Chief Complaint:  Chief Complaint  Patient presents with   Fall   HPI: [name] is a 53 y.o. female with medical history significant of schizophrenia, GERD, anemia of chronic disease, chronic thrombocytopenia, history of strokes, and history of malignant melanoma with mets to the brain status postchemotherapy.  Patient sent to the ED via EMS after having 2 falls in 1 week.  According to facility staff she has not taken her medications in 6 months.  She was alert and able to answer questions appropriately in the triage area.  She had denied hitting her head or any loss of consciousness.  She was complaining of right elbow and right knee pain.  Radiographic workup in the ED did not show any acute traumatic injuries.  He is otherwise normotensive and afebrile and not hypoxic.  Unfortunately her initial sodium was 120 with a chloride of 89, CO2 21, potassium 3.4, BUN 10 and creatinine 0.38, WBCs 4200 differential not obtained, hemoglobin 11.1, platelets 67,000.  Urinalysis was unremarkable except for low specific gravity of 1.004.  Hospital service was asked to evaluate the patient for admission.  Upon my evaluation of the patient I found her sitting on a bedpan and she was very frustrated because she felt the bedpan was not large enough for her to urinate in and it was noted she had urinated past the bedpan onto the sheets.  She was quite fixated on someone obtaining the pink larger bedpan for her.  He repeatedly stated that she profusely had to urinate.  I eventually was able to get her calm down noting that she was informed staff member would be bringing in a bedside commode for her.  No other apparent symptoms reported by the patient.  I later asked her would she wish  to take other medications prescribed for her as long as they were not medications for schizophrenia.  At this point the patient became extremely agitated and began to yell at me that she "does not have schizophrenia" and that we are "not doing right by her".  I later reframed the question by asking if she had bipolar disorder.  Again she became even more agitated and stated "no I do not and you are not doing right by me".  I had already examined the patient and therefore abruptly terminated the interview.  It is noted on the clinical exam that she was extremely pale and appeared profoundly dehydrated.  Review of Systems: As mentioned in the history of present illness. All other systems reviewed and are negative. Past Medical History:  Diagnosis Date   Allergy    Anemia    Gallstones    GERD (gastroesophageal reflux disease)    Hemorrhoids    melanoma with met dz dx'd 01/2018   brain and adrenal   Seasonal allergies    Stroke [facility])    Past Surgical History:  Procedure Laterality Date   BIOPSY  01/28/2018   Procedure: BIOPSY;  Surgeon: Jolinda Necessary, MD;  Location: [facility];  Service: Endoscopy;;   BREAST BIOPSY Left    ESOPHAGOGASTRODUODENOSCOPY N/A 01/28/2018   Procedure: ESOPHAGOGASTRODUODENOSCOPY (EGD);  Surgeon: Jolinda Necessary, MD;  Location: [facility];  Service: Endoscopy;  Laterality: N/A;   MOUTH SURGERY     OVARY SURGERY     removed "something"  Social History:  reports that she has never smoked. She has never used smokeless tobacco. She reports that she does not drink alcohol and does not use drugs.  No Known Allergies  Family History  Problem Relation Age of Onset   Melanoma Mother    Hypertension Father    Breast cancer Maternal Aunt    Breast cancer Paternal Aunt    Diabetes Paternal Aunt    Breast cancer Maternal Grandmother    Breast cancer Paternal Grandmother    Diabetes Paternal Grandmother    Colon cancer Neg Hx    Esophageal cancer Neg Hx     Pancreatic cancer Neg Hx    Stomach cancer Neg Hx    Liver disease Neg Hx    Rectal cancer Neg Hx     Prior to Admission medications   Medication Sig Start Date End Date Taking? Authorizing Provider  acetaminophen  (TYLENOL ) 325 MG tablet Take 650 mg by mouth every 6 (six) hours as needed for moderate pain (pain score 4-6).   Yes [provider]  AMITIZA 24 MCG capsule Take 24 mcg by mouth daily. 09/10/23  Yes [provider]  FLUoxetine (PROZAC) 10 MG capsule Take 10 mg by mouth daily. 09/10/23  Yes [provider]  hydrOXYzine  (ATARAX ) 25 MG tablet Take 25 mg by mouth 3 (three) times daily. 07/31/23  Yes [provider]  INVEGA SUSTENNA injection Inject 78 mg into the muscle every 30 (thirty) days. 08/05/23  Yes [provider]  mirtazapine (REMERON) 7.5 MG tablet Take 7.5 mg by mouth at bedtime. 07/31/23  Yes [provider]  OLANZapine  (ZYPREXA ) 2.5 MG tablet Take 2.5-5 mg by mouth See admin instructions. Take 2.5mg  (1 tablet) by mouth every morning and 5mg  (2 tablets) every night at bedtime. 08/01/23  Yes [provider]  ondansetron  (ZOFRAN ) 4 MG tablet Take 4 mg by mouth every 8 (eight) hours as needed for vomiting or nausea. 08/22/23  Yes [provider]  risperiDONE  (RISPERDAL ) 1 MG/ML oral solution Take 0.5 mg by mouth 2 (two) times daily. 07/31/23  Yes [provider]  rosuvastatin  (CRESTOR ) 40 MG tablet Take 1 tablet (40 mg total) by mouth daily. 06/14/22  Yes Hershel Los, MD  sennosides-docusate sodium  (SENOKOT-S) 8.6-50 MG tablet Take 1 tablet by mouth 2 (two) times daily.   Yes [provider]    Physical Exam: Vitals:   11/02/23 1029 11/02/23 1345  BP: (!) 122/90 (!) 153/103  Pulse: 76 82  Resp: 14 18  Temp: 98.9 F (37.2 C) 98.5 F (36.9 C)  TempSrc: Oral   SpO2: 97% 99%  Weight: 50 kg   Height: 5' (1.524 m)    Constitutional: Appears acutely ill, pale and easily agitated ENMT:  Mucous membranes are extremely dry, poor dentition.  Respiratory: clear to auscultation bilaterally, no wheezing, no crackles. Normal respiratory effort. No accessory muscle use.  Cardiovascular: Regular rate and rhythm, no murmurs / rubs / gallops. No extremity edema. 2+ pedal pulses.  Abdomen: no tenderness, no masses palpated. No VFs hepatosplenomegaly. Bowel sounds positive.  Musculoskeletal: no clubbing / cyanosis. No joint deformity upper and lower extremities. Good ROM, no contractures. Normal muscle tone.  Skin: no rashes, lesions, ulcers. No induration-older appearing bruise on the right cheek Neurologic: CN 2-12 grossly intact. Sensation intact, Strength 4/5 x all 4 extremities.  Psychiatric: Oriented to place only.  Patient perseverates by utilizing the word profusely anytime she is trying to make her point.  Fixates on certain issues  such as the bedpan.  Becomes extremely agitated if you suggest that she has an underlying psychiatric condition.  Data Reviewed:  As per HPI  Assessment and Plan: Acute symptomatic hyponatremia In review of old records patient's sodium in late 2024 was 137 and then in February was 129 and currently is 120 Suspect dehydration contributing.  Patient reports she does not eat the food at the facility because she does not like it Unclear if there may be a component of SIADH possibly from recurrence of metastasis to the pituitary Workup has been further complicated by the fact that patient has recently been refusing follow-up MRI of the brain and PET scan as ordered by the oncology team Obtain urine sodium and potassium, urine osmolality and serum osmolality Patient did receive 1 L of IV fluids while in the ED Will go ahead and continue maintenance IV fluids normal saline at 150 cc/h for now Given low sodium will order seizure precautions and will follow neurostatus with neurochecks every 4 hours Admit to progressive care  Frequent falls Suspect secondary  to volume depletion and possible orthostasis as well as hyponatremia Patient denied tripping prior to her fall but was unable to recall history of whether she became dizzy or weak prior to fall Physical therapy evaluation  Known malignant melanoma with brain metastasis status post chemotherapy Stage IV melanoma diagnosed in July 2019.  She presented with peritoneal disease and CNS involvement without unknown primary.  She subsequently underwent subcutaneous node biopsy that confirmed the presence of metastatic melanoma.  She underwent whole brain radiation in 2019 and was treated with Mektovi  and [name]  but therapy was discontinued in 2019 and November after partial response.  She subsequently was treated with pembrolizumab  3 weeks and completed 12 cycles in July 2020.  Follow-up pan CT in May 2024 was negative for any acute intracranial process.  Her last brain MRI in 2023 did not show any active metastatic disease.  The patient has not been following up consistently and although she did follow-up with the oncology team in February 2025 she did not keep her appointment for MRI brain and PET scan. Patient will corroborate we will try to obtain MRI of the brain while she is inpatient At this juncture no focal neurological deficits although with significant hyponatremia concerns are for possible reemergence of metastatic disease possible pituitary involvement leading to SIADH I have added Dr. Marguerita Shih as a consultant  Schizophrenia no noncompliance with medical therapy/has legal guardian Very difficult situation.  Patient has not been taking her psychiatric medications for at least 6 months which has further worsened her underlying psychiatric condition.  She absolutely refuses to believe that she has schizophrenia or any other psychiatric condition. She has a legal guardian and based on this and my interview with the patient she clearly lacks capacity. Given her refusal of care not only for psychiatric  illness as well as appropriate treatment or surveillance for her underlying cancer there are concerns over how to continue to manage her over the long-term.  I have asked palliative care for their input regarding end-of-life issues noting the patient is still a full code and appears to not have capacity to make such a decision.  I have asked TOC to get in contact with the patient's guardian to give us  input as well.  Chronic thrombocytopenia Platelets are 67,000 with baseline being between 38 and 56,000 Continue to follow No pharmcotherapeutic DVT prophylaxis   Advance Care Planning:   Code Status: Full Code  VTE prophylaxis: SCDs  Consults: Oncology  Family Communication: Has a legal guardian  Severity of Illness: The appropriate patient status for this patient is INPATIENT. Inpatient status is judged to be reasonable and necessary in order to provide the required intensity of service to ensure the patient's safety. The patient's presenting symptoms, physical exam findings, and initial radiographic and laboratory data in the context of their chronic comorbidities is felt to place them at high risk for further clinical deterioration. Furthermore, it is not anticipated that the patient will be medically stable for discharge from the hospital within 2 midnights of admission.   * I certify that at the point of admission it is my clinical judgment that the patient will require inpatient hospital care spanning beyond 2 midnights from the point of admission due to high intensity of service, high risk for further deterioration and high frequency of surveillance required.*  Author: Kathye Parkin, NP 11/02/2023 2:31 PM  For on call review [email or web address].

## 2023-11-02 NOTE — ED Provider Notes (Addendum)
 [place] EMERGENCY DEPARTMENT AT [facility] Provider Note   CSN: [id number] Arrival date & time: 11/02/23  1018     History  Chief Complaint  Patient presents with   [facility] Kaitlyn Duncan is a 53 y.o. female.  Pt c/o fall and right elbow and right hip pain. Pt indicates walks w walker, but unsteady/hx of falls at baseline. Pt unclear what caused fall - notes recent frequent falls. Denies faintness or loc currently. No headache. Main c/o is pain to right elbow. Also w pain to right hip, thigh, and lower back. No radicular pain. No new numbness or weakness. Neck pain. Denies chest pain or sob. No abd pain or nv. No dysuria or gu c/o. No fever or chills. No anticoagulant use. Pt poor historian - level 5 caveat.   The history is provided by the patient, the nursing home and the EMS personnel. The history is limited by the condition of the patient.  Fall Pertinent negatives include no chest pain, no abdominal pain, no headaches and no shortness of breath.       Home Medications Prior to Admission medications   Medication Sig Start Date End Date Taking? Authorizing Provider  linaclotide  (LINZESS ) 290 MCG CAPS capsule Take 1 capsule (290 mcg total) by mouth daily before breakfast. 07/21/22   Kaitlyn Homestead, MD  risperiDONE  (RISPERDAL ) 1 MG tablet Take 0.5 tablets (0.5 mg total) by mouth 2 (two) times daily. 08/12/22   Kaitlyn Leather, MD  rosuvastatin  (CRESTOR ) 40 MG tablet Take 1 tablet (40 mg total) by mouth daily. 06/14/22   Kaitlyn Los, MD      Allergies    Patient has no known allergies.    Review of Systems   Review of Systems  Constitutional:  Negative for fever.  HENT:  Negative for sore throat.   Eyes:  Negative for visual disturbance.  Respiratory:  Negative for cough and shortness of breath.   Cardiovascular:  Negative for chest pain.  Gastrointestinal:  Negative for abdominal pain, diarrhea and vomiting.  Genitourinary:  Negative for  dysuria and flank pain.  Musculoskeletal:  Positive for back pain. Negative for neck pain.  Skin:  Negative for wound.  Neurological:  Negative for weakness, numbness and headaches.    Physical Exam Updated Vital Signs BP (!) 122/90   Pulse 76   Temp 98.9 F (37.2 C) (Oral)   Resp 14   Ht 1.524 m (5')   Wt 50 kg   SpO2 97%   BMI 21.53 kg/m  Physical Exam Vitals and nursing note reviewed.  Constitutional:      Appearance: Normal appearance. She is well-developed.  HENT:     Head: Atraumatic.     Nose: Nose normal.     Mouth/Throat:     Comments: Dry mucous membranes Eyes:     General: No scleral icterus.    Conjunctiva/sclera: Conjunctivae normal.     Pupils: Pupils are equal, round, and reactive to light.  Neck:     Trachea: No tracheal deviation.  Cardiovascular:     Rate and Rhythm: Normal rate and regular rhythm.     Pulses: Normal pulses.     Heart sounds: Normal heart sounds. No murmur heard.    No friction rub. No gallop.  Pulmonary:     Effort: Pulmonary effort is normal. No respiratory distress.     Breath sounds: Normal breath sounds.  Chest:     Chest wall: No tenderness.  Abdominal:  General: Bowel sounds are normal. There is no distension.     Palpations: Abdomen is soft.     Tenderness: There is no abdominal tenderness. There is no guarding.     Comments: No abd bruising or contusion  Genitourinary:    Comments: No cva tenderness.  Musculoskeletal:        General: No swelling or tenderness.     Cervical back: Normal range of motion and neck supple. No rigidity or tenderness. No muscular tenderness.     Right lower leg: No edema.     Left lower leg: No edema.     Comments: Mild lumbar tenderness, otherwise, CTLS spine, non tender, aligned, no step off. Tenderness right elbow and right hip, right thigh and right knee, otherwise good rom bil extremities without other pain or focal bony tenderness. Distal pulses palp bil extremities.   Skin:     General: Skin is warm and dry.     Findings: No rash.  Neurological:     Mental Status: She is alert.     Comments: Alert, speech normal. GCS 15. Motor/sens grossly intact bil.   Psychiatric:        Mood and Affect: Mood normal.     ED Results / Procedures / Treatments   Labs (all labs ordered are listed, but only abnormal results are displayed) Results for orders placed or performed during the hospital encounter of 11/02/23  Basic metabolic panel with GFR   Collection Time: 11/02/23 11:45 AM  Result Value Ref Range   Sodium 120 (L) 135 - 145 mmol/L   Potassium 3.4 (L) 3.5 - 5.1 mmol/L   Chloride 89 (L) 98 - 111 mmol/L   CO2 21 (L) 22 - 32 mmol/L   Glucose, Bld 94 70 - 99 mg/dL   BUN 10 6 - 20 mg/dL   Creatinine, Ser 5.78 (L) 0.44 - 1.00 mg/dL   Calcium  8.9 8.9 - 10.3 mg/dL   GFR, Estimated >46 >96 mL/min   Anion gap 10 5 - 15  Urinalysis, Routine w reflex microscopic -Urine, Clean Catch   Collection Time: 11/02/23 11:58 AM  Result Value Ref Range   Color, Urine STRAW (A) YELLOW   APPearance CLEAR CLEAR   Specific Gravity, Urine 1.004 (L) 1.005 - 1.030   pH 8.0 5.0 - 8.0   Glucose, UA NEGATIVE NEGATIVE mg/dL   Hgb urine dipstick NEGATIVE NEGATIVE   Bilirubin Urine NEGATIVE NEGATIVE   Ketones, ur NEGATIVE NEGATIVE mg/dL   Protein, ur NEGATIVE NEGATIVE mg/dL   Nitrite NEGATIVE NEGATIVE   Leukocytes,Ua NEGATIVE NEGATIVE  CBC   Collection Time: 11/02/23 12:50 PM  Result Value Ref Range   WBC 4.2 4.0 - 10.5 K/uL   RBC 4.10 3.87 - 5.11 MIL/uL   Hemoglobin 11.1 (L) 12.0 - 15.0 g/dL   HCT 29.5 (L) 28.4 - 13.2 %   MCV 80.5 80.0 - 100.0 fL   MCH 27.1 26.0 - 34.0 pg   MCHC 33.6 30.0 - 36.0 g/dL   RDW 44.0 10.2 - 72.5 %   Platelets 67 (L) 150 - 400 K/uL   nRBC 0.0 0.0 - 0.2 %     EKG None  Radiology DG Lumbar Spine Complete Result Date: 11/02/2023 CLINICAL DATA:  Low back pain after multiple falls in the last week. EXAM: LUMBAR SPINE - COMPLETE 4+ VIEW COMPARISON:   September 24, 2021. FINDINGS: There is no evidence of lumbar spine fracture. Alignment is normal. Moderate degenerative disc disease is noted at L5-S1. IMPRESSION:  Moderate degenerative disc disease at L5-S1. No acute abnormality seen. Electronically Signed   By: Rosalene Colon M.D.   On: 11/02/2023 13:00   DG HIP UNILAT W OR W/O PELVIS 2-3 VIEWS RIGHT Result Date: 11/02/2023 CLINICAL DATA:  Fall and trauma to the right lower extremity. EXAM: RIGHT FEMUR 2 VIEWS; DG HIP (WITH OR WITHOUT PELVIS) 2-3V RIGHT; RIGHT KNEE - COMPLETE 4+ VIEW COMPARISON:  None Available. FINDINGS: There is no acute fracture or dislocation. The bones are osteopenic. Mild arthritic changes of the right hip and knee. The soft tissues are unremarkable. IMPRESSION: 1. No acute fracture or dislocation. 2. Osteopenia. Electronically Signed   By: Angus Bark M.D.   On: 11/02/2023 12:55   DG Femur Min 2 Views Right Result Date: 11/02/2023 CLINICAL DATA:  Fall and trauma to the right lower extremity. EXAM: RIGHT FEMUR 2 VIEWS; DG HIP (WITH OR WITHOUT PELVIS) 2-3V RIGHT; RIGHT KNEE - COMPLETE 4+ VIEW COMPARISON:  None Available. FINDINGS: There is no acute fracture or dislocation. The bones are osteopenic. Mild arthritic changes of the right hip and knee. The soft tissues are unremarkable. IMPRESSION: 1. No acute fracture or dislocation. 2. Osteopenia. Electronically Signed   By: Angus Bark M.D.   On: 11/02/2023 12:55   DG Knee Complete 4 Views Right Result Date: 11/02/2023 CLINICAL DATA:  Fall and trauma to the right lower extremity. EXAM: RIGHT FEMUR 2 VIEWS; DG HIP (WITH OR WITHOUT PELVIS) 2-3V RIGHT; RIGHT KNEE - COMPLETE 4+ VIEW COMPARISON:  None Available. FINDINGS: There is no acute fracture or dislocation. The bones are osteopenic. Mild arthritic changes of the right hip and knee. The soft tissues are unremarkable. IMPRESSION: 1. No acute fracture or dislocation. 2. Osteopenia. Electronically Signed   By: Angus Bark  M.D.   On: 11/02/2023 12:55   DG ELBOW COMPLETE RIGHT (3+VIEW) Result Date: 11/02/2023 CLINICAL DATA:  Two falls over the last week.  Right elbow pain. EXAM: RIGHT ELBOW - COMPLETE 3+ VIEW COMPARISON:  None Available. FINDINGS: There is no evidence of fracture, dislocation, or joint effusion. There is no evidence of arthropathy or other focal bone abnormality. Soft tissues are unremarkable. IMPRESSION: Negative right elbow radiographs Electronically Signed   By: Audree Leas M.D.   On: 11/02/2023 12:52    Procedures Procedures    Medications Ordered in ED Medications - No data to display  ED Course/ Medical Decision Making/ A&P                                 Medical Decision Making Problems Addressed: Dehydration: acute illness or injury with systemic symptoms that poses a threat to life or bodily functions Frequent falls: acute illness or injury Generalized weakness: acute illness or injury with systemic symptoms that poses a threat to life or bodily functions Hypokalemia: acute illness or injury Hyponatremia: acute illness or injury with systemic symptoms that poses a threat to life or bodily functions Thrombocytopenia (HCC): chronic illness or injury  Amount and/or Complexity of Data Reviewed Independent Historian: EMS    Details: hx External Data Reviewed: notes. Labs: ordered. Decision-making details documented in ED Course. Radiology: ordered and independent interpretation performed. Decision-making details documented in ED Course. ECG/medicine tests: ordered and independent interpretation performed. Decision-making details documented in ED Course. Discussion of management or test interpretation with external provider(s): medicine  Risk Prescription drug management. Parenteral controlled substances. Decision regarding hospitalization.   Labs ordered/sent. Imaging  ordered.   Differential diagnosis includes  . Dispo decision including potential need for  admission considered - will get labs and imaging and reassess.   Reviewed nursing notes and prior charts for additional history. External reports reviewed. Additional history from:  Cardiac monitor: sinus rhythm, rate 76.  Labs reviewed/interpreted by me - wbc 4, hct 33.  Chem w na v low, 120. Ns bolus. K low, kcl po. Ua neg for uti. Plt chronically low.   Xrays reviewed/interpreted by me - no fx.   Given weakness, frequent falls, new hyponatremia, dehydration - will admit.   Additional ns iv bolus.  Hospitalists consulted for admission.            Final Clinical Impression(s) / ED Diagnoses Final diagnoses:  None    Rx / DC Orders ED Discharge Orders     None           Guadalupe Lee, MD 11/02/23 1335

## 2023-11-03 ENCOUNTER — Inpatient Hospital Stay (HOSPITAL_COMMUNITY): Payer: Medicare (Managed Care)

## 2023-11-03 ENCOUNTER — Ambulatory Visit: Admitting: Internal Medicine

## 2023-11-03 ENCOUNTER — Other Ambulatory Visit

## 2023-11-03 DIAGNOSIS — F209 Schizophrenia, unspecified: Secondary | ICD-10-CM

## 2023-11-03 DIAGNOSIS — E871 Hypo-osmolality and hyponatremia: Secondary | ICD-10-CM | POA: Diagnosis present

## 2023-11-03 DIAGNOSIS — F22 Delusional disorders: Secondary | ICD-10-CM

## 2023-11-03 DIAGNOSIS — Z91199 Patient's noncompliance with other medical treatment and regimen due to unspecified reason: Secondary | ICD-10-CM

## 2023-11-03 LAB — CBC
HCT: 35.8 % — ABNORMAL LOW (ref 36.0–46.0)
Hemoglobin: 12.1 g/dL (ref 12.0–15.0)
MCH: 27.1 pg (ref 26.0–34.0)
MCHC: 33.8 g/dL (ref 30.0–36.0)
MCV: 80.3 fL (ref 80.0–100.0)
Platelets: 76 10*3/uL — ABNORMAL LOW (ref 150–400)
RBC: 4.46 MIL/uL (ref 3.87–5.11)
RDW: 12.7 % (ref 11.5–15.5)
WBC: 2.8 10*3/uL — ABNORMAL LOW (ref 4.0–10.5)
nRBC: 0 % (ref 0.0–0.2)

## 2023-11-03 LAB — OSMOLALITY: Osmolality: 256 mosm/kg — ABNORMAL LOW (ref 275–295)

## 2023-11-03 LAB — SODIUM
Sodium: 123 mmol/L — ABNORMAL LOW (ref 135–145)
Sodium: 125 mmol/L — ABNORMAL LOW (ref 135–145)
Sodium: 124 mmol/L — ABNORMAL LOW (ref 135–145)

## 2023-11-03 LAB — COMPREHENSIVE METABOLIC PANEL WITH GFR
ALT: 23 U/L (ref 0–44)
AST: 48 U/L — ABNORMAL HIGH (ref 15–41)
Albumin: 4 g/dL (ref 3.5–5.0)
Alkaline Phosphatase: 79 U/L (ref 38–126)
Anion gap: 10 (ref 5–15)
BUN: 12 mg/dL (ref 6–20)
CO2: 20 mmol/L — ABNORMAL LOW (ref 22–32)
Calcium: 8.9 mg/dL (ref 8.9–10.3)
Chloride: 94 mmol/L — ABNORMAL LOW (ref 98–111)
Creatinine, Ser: 0.53 mg/dL (ref 0.44–1.00)
GFR, Estimated: >60 mL/min (ref >60)
Glucose, Bld: 77 mg/dL (ref 70–99)
Potassium: 3.7 mmol/L (ref 3.5–5.1)
Sodium: 124 mmol/L — ABNORMAL LOW (ref 135–145)
Total Bilirubin: 1 mg/dL (ref 0.0–1.2)
Total Protein: 7.1 g/dL (ref 6.5–8.1)

## 2023-11-03 LAB — OSMOLALITY, URINE: Osmolality, Ur: 228 mosm/kg — ABNORMAL LOW (ref 300–900)

## 2023-11-03 LAB — CORTISOL: Cortisol, Plasma: 5.7 ug/dL

## 2023-11-03 LAB — HIV ANTIBODY (ROUTINE TESTING W REFLEX): HIV Screen 4th Generation wRfx: NONREACTIVE

## 2023-11-03 MED ORDER — CARMEX CLASSIC LIP BALM EX OINT: TOPICAL_OINTMENT | CUTANEOUS | Status: DC | PRN

## 2023-11-03 MED ORDER — COSYNTROPIN 0.25 MG IJ SOLR
0.2500 mg | Freq: Once | INTRAMUSCULAR | Status: AC
  Administered 2023-11-04: 0.25 mg via INTRAVENOUS
  Filled 2023-11-03: qty 0.25

## 2023-11-03 MED ORDER — SODIUM CHLORIDE 0.9 % IV SOLN: INTRAVENOUS | Status: DC

## 2023-11-03 MED ORDER — GADOBUTROL 1 MMOL/ML IV SOLN
5.0000 mL | Freq: Once | INTRAVENOUS | Status: AC | PRN
  Administered 2023-11-03: 5 mL via INTRAVENOUS

## 2023-11-03 NOTE — Plan of Care (Signed)

## 2023-11-03 NOTE — Plan of Care (Signed)
   Problem: Education: Goal: Knowledge of General Education information will improve Description: Including pain rating scale, medication(s)/side effects and non-pharmacologic comfort measures Outcome: Progressing   Problem: Clinical Measurements: Goal: Ability to maintain clinical measurements within normal limits will improve Outcome: Progressing Goal: Will remain free from infection Outcome: Progressing Goal: Diagnostic test results will improve Outcome: Progressing Goal: Respiratory complications will improve Outcome: Progressing Goal: Cardiovascular complication will be avoided Outcome: Progressing   Problem: Nutrition: Goal: Adequate nutrition will be maintained Outcome: Progressing   Problem: Coping: Goal: Level of anxiety will decrease Outcome: Progressing   Problem: Elimination: Goal: Will not experience complications related to bowel motility Outcome: Progressing Goal: Will not experience complications related to urinary retention Outcome: Progressing   Problem: Pain Managment: Goal: General experience of comfort will improve and/or be controlled Outcome: Progressing   Problem: Safety: Goal: Ability to remain free from injury will improve Outcome: Progressing   Problem: Skin Integrity: Goal: Risk for impaired skin integrity will decrease Outcome: Progressing

## 2023-11-03 NOTE — Progress Notes (Addendum)
 PROGRESS NOTE    Kaitlyn Duncan  ZOX:[id number] DOB: 06/07/1971 DOA: 11/02/2023 PCP: Adelia Homestead, MD    Brief Narrative:  Kaitlyn Duncan is a 53 y.o. female with medical history significant of schizophrenia, GERD, anemia of chronic disease, chronic thrombocytopenia, history of strokes, and history of malignant melanoma with mets to the brain status postchemotherapy.  Patient sent to the ED via EMS after having 2 falls in 1 week.  According to facility staff she has not taken her medications in 6 months.  Found to have hyponatremia.    Assessment and Plan: Acute symptomatic hyponatremia- suspect SIADH -sodium in late 2024 was 137 and then in February was 129 and currently is 120 -SIADH -Workup has been further complicated by the fact that patient has recently been refusing follow-up MRI of the brain and PET scan as ordered by the oncology team -continue IVF -Na q 4 hours Cortisol low will get stim test   Frequent falls Suspect secondary to volume depletion and possible orthostasis as well as hyponatremia Patient denied tripping prior to her fall but was unable to recall history of whether she became dizzy or weak prior to fall Physical therapy evaluation   Known malignant melanoma with brain metastasis status post chemotherapy -Stage IV melanoma diagnosed in July 2019.   - patient has not been following up consistently and although she did follow-up with the oncology team in February 2025 she did not keep her appointment for MRI brain and PET scan. -Dr. Marguerita Shih added to treatment team   Schizophrenia no noncompliance with medical therapy/has legal guardian -difficult situation.   -Patient has not been taking her psychiatric medications for at least 6 months which has further worsened her underlying psychiatric condition.  She absolutely refuses to believe that she has schizophrenia or any other psychiatric condition. - palliative care for their input regarding  end-of-life issues noting the patient is still a full code and appears to not have capacity to make such a decision.  Asked TOC to get in contact with the patient's guardian to give us  input as well.   Chronic thrombocytopenia -trending up       DVT prophylaxis: SCDs Start: 11/02/23 1429    Code Status: Full Code Called guardian-- updated and she requested that oncology come see her at [facility]....  Disposition Plan:  Level of care: Progressive Status is: Inpatient     Consultants:  Palliative care   Subjective: C/o lips being dry  Objective: Vitals:   11/03/23 0215 11/03/23 0230 11/03/23 0500 11/03/23 0513  BP: 110/82 108/76 (!) 129/93   Pulse: 71 70 66   Resp: 13 16 20    Temp:    98.2 F (36.8 C)  TempSrc:    Oral  SpO2: 97% 98% 97%   Weight:      Height:        Intake/Output Summary (Last 24 hours) at 11/03/2023 0800 Last data filed at 11/03/2023 0319 Gross per 24 hour  Intake 1000 ml  Output 300 ml  Net 700 ml   Filed Weights   11/02/23 1029  Weight: 50 kg    Examination:   General: Appearance:    Chronically ill appearing female in no acute distress     Lungs:     respirations unlabored  Heart:    Normal heart rate. Normal rhythm. No murmurs, rubs, or gallops.    MS:   All extremities are intact.    Neurologic:   Awake, alert  Data Reviewed: I have personally reviewed following labs and imaging studies  CBC: Recent Labs  Lab 11/02/23 1250 11/03/23 0520  WBC 4.2 2.8*  HGB 11.1* 12.1  HCT 33.0* 35.8*  MCV 80.5 80.3  PLT 67* 76*   Basic Metabolic Panel: Recent Labs  Lab 11/02/23 1145 11/02/23 1908 11/03/23 0126 11/03/23 0520  NA 120* 124* 123* 124*  K 3.4*  --   --  3.7  CL 89*  --   --  94*  CO2 21*  --   --  20*  GLUCOSE 94  --   --  77  BUN 10  --   --  12  CREATININE 0.38*  --   --  0.53  CALCIUM  8.9  --   --  8.9   GFR: Estimated Creatinine Clearance: 57.7 mL/min (by C-G formula based on SCr of 0.53  mg/dL). Liver Function Tests: Recent Labs  Lab 11/03/23 0520  AST 48*  ALT 23  ALKPHOS 79  BILITOT 1.0  PROT 7.1  ALBUMIN 4.0   No results for input(s): "LIPASE", "AMYLASE" in the last 168 hours. No results for input(s): "AMMONIA" in the last 168 hours. Coagulation Profile: No results for input(s): "INR", "PROTIME" in the last 168 hours. Cardiac Enzymes: No results for input(s): "CKTOTAL", "CKMB", "CKMBINDEX", "TROPONINI" in the last 168 hours. BNP (last 3 results) No results for input(s): "PROBNP" in the last 8760 hours. HbA1C: No results for input(s): "HGBA1C" in the last 72 hours. CBG: No results for input(s): "GLUCAP" in the last 168 hours. Lipid Profile: No results for input(s): "CHOL", "HDL", "LDLCALC", "TRIG", "CHOLHDL", "LDLDIRECT" in the last 72 hours. Thyroid  Function Tests: Recent Labs    11/02/23 1908  TSH 1.223   Anemia Panel: No results for input(s): "VITAMINB12", "FOLATE", "FERRITIN", "TIBC", "IRON", "RETICCTPCT" in the last 72 hours. Sepsis Labs: No results for input(s): "PROCALCITON", "LATICACIDVEN" in the last 168 hours.  No results found for this or any previous visit (from the past 240 hours).       Radiology Studies: DG Lumbar Spine Complete Result Date: 11/02/2023 CLINICAL DATA:  Low back pain after multiple falls in the last week. EXAM: LUMBAR SPINE - COMPLETE 4+ VIEW COMPARISON:  September 24, 2021. FINDINGS: There is no evidence of lumbar spine fracture. Alignment is normal. Moderate degenerative disc disease is noted at L5-S1. IMPRESSION: Moderate degenerative disc disease at L5-S1. No acute abnormality seen. Electronically Signed   By: Rosalene Colon M.D.   On: 11/02/2023 13:00   DG HIP UNILAT W OR W/O PELVIS 2-3 VIEWS RIGHT Result Date: 11/02/2023 CLINICAL DATA:  Fall and trauma to the right lower extremity. EXAM: RIGHT FEMUR 2 VIEWS; DG HIP (WITH OR WITHOUT PELVIS) 2-3V RIGHT; RIGHT KNEE - COMPLETE 4+ VIEW COMPARISON:  None Available.  FINDINGS: There is no acute fracture or dislocation. The bones are osteopenic. Mild arthritic changes of the right hip and knee. The soft tissues are unremarkable. IMPRESSION: 1. No acute fracture or dislocation. 2. Osteopenia. Electronically Signed   By: Angus Bark M.D.   On: 11/02/2023 12:55   DG Femur Min 2 Views Right Result Date: 11/02/2023 CLINICAL DATA:  Fall and trauma to the right lower extremity. EXAM: RIGHT FEMUR 2 VIEWS; DG HIP (WITH OR WITHOUT PELVIS) 2-3V RIGHT; RIGHT KNEE - COMPLETE 4+ VIEW COMPARISON:  None Available. FINDINGS: There is no acute fracture or dislocation. The bones are osteopenic. Mild arthritic changes of the right hip and knee. The soft tissues are unremarkable. IMPRESSION:  1. No acute fracture or dislocation. 2. Osteopenia. Electronically Signed   By: Angus Bark M.D.   On: 11/02/2023 12:55   DG Knee Complete 4 Views Right Result Date: 11/02/2023 CLINICAL DATA:  Fall and trauma to the right lower extremity. EXAM: RIGHT FEMUR 2 VIEWS; DG HIP (WITH OR WITHOUT PELVIS) 2-3V RIGHT; RIGHT KNEE - COMPLETE 4+ VIEW COMPARISON:  None Available. FINDINGS: There is no acute fracture or dislocation. The bones are osteopenic. Mild arthritic changes of the right hip and knee. The soft tissues are unremarkable. IMPRESSION: 1. No acute fracture or dislocation. 2. Osteopenia. Electronically Signed   By: Angus Bark M.D.   On: 11/02/2023 12:55   DG ELBOW COMPLETE RIGHT (3+VIEW) Result Date: 11/02/2023 CLINICAL DATA:  Two falls over the last week.  Right elbow pain. EXAM: RIGHT ELBOW - COMPLETE 3+ VIEW COMPARISON:  None Available. FINDINGS: There is no evidence of fracture, dislocation, or joint effusion. There is no evidence of arthropathy or other focal bone abnormality. Soft tissues are unremarkable. IMPRESSION: Negative right elbow radiographs Electronically Signed   By: Audree Leas M.D.   On: 11/02/2023 12:52        Scheduled Meds:  sodium chloride  flush   3 mL Intravenous Q12H   Continuous Infusions:   LOS: 1 day    Time spent: 45 minutes spent on chart review, discussion with nursing staff, consultants, updating family and interview/physical exam; more than 50% of that time was spent in counseling and/or coordination of care.    Enrigue Harvard, DO [facility] Available via Epic secure chat 7am-7pm After these hours, please refer to coverage provider listed on [email or web address] 11/03/2023, 8:00 AM

## 2023-11-03 NOTE — Progress Notes (Signed)
     Consult received. Chart reviewed. Discussed with Dr. Vann. She has already spoken with patient's legal guardian and at this time goals are clear for continued work-up and treatment. She is opting to cancel the consult for now, and will re-consult if needed.     Maisie Scotland, NP-C Palliative Medicine   Please call Palliative Medicine team phone with any questions [phone]. For individual providers please see AMION.   No charge

## 2023-11-03 NOTE — Consult Note (Signed)
  Psychiatric Consultation Note Reason for Consult: Schizophrenia, medication non-compliance, assessment for psychiatric hospitalization and management  Assessment: The patient was seen and assessed at bedside for non-compliance with psychiatric medications and longstanding delusional disorder characterized by fixed beliefs denying her schizophrenia diagnosis. She was admitted medically for severe hyponatremia and recurrent falls. Collateral information indicates she has been off antipsychotic medications for over six months while residing at a nursing facility, consistently denying any psychiatric illness.  Attempts to discuss her psychiatric history or medications were met with irritability and irrational responses. The patient has a psychiatric history of schizophrenia and a medical history of melanoma with brain metastases (status post chemotherapy), anemia of chronic disease, thrombocytopenia, GERD, and prior strokes.  Recent MRI findings (11/03/2023) show:  Moderate patchy T2/FLAIR hyperintensities in the white matter, compatible with chronic microvascular ischemic disease.  Remote lacunar infarcts in the left thalamus and right cerebellum.  No evidence of acute intracranial pathology or active metastatic disease.  From a psychiatric standpoint, the MRI is consistent with chronic cerebrovascular changes that may contribute to cognitive impairment but does not explain acute psychiatric decompensation or psychosis.  On evaluation, the patient was minimally interactive but not overtly psychotic. She kept her eyes closed for much of the encounter but demonstrated purposeful behavior (eaten breakfast, used bedside commode). She was alert and oriented to person, place, and time but was begrudgingly cooperative and disengaged once psychiatric assessment was introduced. No evidence of hallucinations, paranoia, or response to internal stimuli was observed during today's encounter.  Clinical  Impression: The patient currently does not meet criteria for inpatient psychiatric hospitalization. Despite her fixed delusions and refusal of psychiatric treatment, she is not acutely psychotic, dangerous to self or others, or gravely disabled to the extent that forced psychiatric medications would be justified. Her clinical presentation aligns more closely with delusional disorder (DSM-5) rather than active schizophrenia with florid psychosis.  While she lacks medical decision-making capacity (as indicated by her court-appointed legal guardian), she retains the right to assent to or refuse medications unless she poses an imminent danger. At this time, her refusal must be honored under patient rights and ethical standards.  Risk vs. Benefit Analysis:  Initiating psychotropic medications against her will at this time would not offer immediate benefit and may worsen her medical condition, particularly her hyponatremia.  Certain medications (e.g., SSRIs, antiepileptics, quetiapine , aripiprazole, clozapine, haloperidol ) are known to exacerbate hyponatremia and are not recommended until the underlying cause of electrolyte disturbance is fully identified and corrected.  Plan:  Continue to manage medically with attention to correcting hyponatremia.  Engage her legal guardian for ongoing treatment decisions.  Psychiatric team to continue monitoring daily for any signs of decompensation that would justify PRN medications for safety.  If ethical concerns escalate (e.g., ongoing medical deterioration with treatment refusal), recommend consulting the hospital ethics committee.  If appropriate, trial restarting psychiatric medications only after stabilization of medical issues, ensuring full disclosure to the patient to minimize suspicion and paranoia.  Psychiatry will maintain patient on active list for reassessment tomorrow by another provider.

## 2023-11-04 DIAGNOSIS — F203 Undifferentiated schizophrenia: Secondary | ICD-10-CM | POA: Diagnosis not present

## 2023-11-04 DIAGNOSIS — E871 Hypo-osmolality and hyponatremia: Secondary | ICD-10-CM | POA: Diagnosis not present

## 2023-11-04 LAB — BASIC METABOLIC PANEL WITH GFR
Anion gap: 5 (ref 5–15)
BUN: 16 mg/dL (ref 6–20)
CO2: 24 mmol/L (ref 22–32)
Calcium: 8.4 mg/dL — ABNORMAL LOW (ref 8.9–10.3)
Chloride: 100 mmol/L (ref 98–111)
Creatinine, Ser: 0.53 mg/dL (ref 0.44–1.00)
GFR, Estimated: >60 mL/min (ref >60)
Glucose, Bld: 80 mg/dL (ref 70–99)
Potassium: 3.5 mmol/L (ref 3.5–5.1)
Sodium: 129 mmol/L — ABNORMAL LOW (ref 135–145)

## 2023-11-04 LAB — SODIUM: Sodium: 123 mmol/L — ABNORMAL LOW (ref 135–145)

## 2023-11-04 LAB — ACTH STIMULATION, 3 TIME POINTS
Cortisol, 30 Min: 11.1 ug/dL
Cortisol, 60 Min: 13.9 ug/dL
Cortisol, Base: 4.5 ug/dL

## 2023-11-04 LAB — CBC
HCT: 33.2 % — ABNORMAL LOW (ref 36.0–46.0)
Hemoglobin: 10.6 g/dL — ABNORMAL LOW (ref 12.0–15.0)
MCH: 27 pg (ref 26.0–34.0)
MCHC: 31.9 g/dL (ref 30.0–36.0)
MCV: 84.5 fL (ref 80.0–100.0)
Platelets: 72 10*3/uL — ABNORMAL LOW (ref 150–400)
RBC: 3.93 MIL/uL (ref 3.87–5.11)
RDW: 12.7 % (ref 11.5–15.5)
WBC: 2.7 10*3/uL — ABNORMAL LOW (ref 4.0–10.5)
nRBC: 0 % (ref 0.0–0.2)

## 2023-11-04 MED ORDER — HYDROCORTISONE 10 MG PO TABS
10.0000 mg | ORAL_TABLET | Freq: Every morning | ORAL | Status: DC
  Administered 2023-11-05 – 2023-11-08 (×4): 10 mg via ORAL
  Filled 2023-11-04 (×5): qty 1

## 2023-11-04 MED ORDER — RISPERIDONE 1 MG PO TABS
0.5000 mg | ORAL_TABLET | Freq: Two times a day (BID) | ORAL | Status: DC
  Administered 2023-11-04 – 2023-11-07 (×8): 0.5 mg via ORAL
  Filled 2023-11-04 (×8): qty 1

## 2023-11-04 MED ORDER — HYDROCORTISONE 10 MG PO TABS
5.0000 mg | ORAL_TABLET | Freq: Every day | ORAL | Status: DC
  Administered 2023-11-04 – 2023-11-07 (×4): 5 mg via ORAL
  Filled 2023-11-04 (×5): qty 1

## 2023-11-04 NOTE — Consult Note (Addendum)
 [facility] Consult Follow-up  Patient Name: .Kaitlyn Duncan  MRN: [id number]  DOB: [birth year]  Consult Order details:  Orders (From admission, onward)     Start     Ordered   11/03/23 0811  IP CONSULT TO PSYCHIATRY       Ordering Provider: Enrigue Harvard, DO  Provider:  (Not yet assigned)  Question Answer Comment  Location [facility]   Reason for Consult? medication management/optimization (has been off meds for around 6 month)      11/03/23 0813             Mode of Visit: In person    Psychiatry Consult Evaluation  Service Date: Nov 04, 2023 LOS:  LOS: 2 days  Chief Complaint "I feel fine"  Primary Psychiatric Diagnoses  Schizophrenia, undifferentiated   Assessment  Kaitlyn Duncan is a 53 y.o. female admitted: Medicallyfor 11/02/2023 10:19 AM for hyponatremia. She carries the psychiatric diagnoses of schizophrenia and has a past medical history of GERD, anemia of chronic disease, chronic thrombocytopenia, history of strokes, and history of malignant melanoma with mets to the brain status postchemotherapy.   The patient was seen and assessed at bedside for non-compliance with psychiatric medications for the past six months and longstanding delusional disorder characterized by fixed beliefs denying her schizophrenia diagnosis. She was admitted medically for severe hyponatremia and recurrent falls. Collateral information indicates she has been off antipsychotic medications for over six months while residing at a nursing facility, consistently denying any psychiatric illness.  Attempts to discuss her psychiatric history or medications were met with irritability and irrational responses. The patient has a psychiatric history of schizophrenia and a medical history of melanoma with brain metastases (status post chemotherapy), anemia of chronic disease, thrombocytopenia, GERD, and prior strokes.   Diagnoses:  Active Hospital  problems: Principal Problem:   Acute hyponatremia Active Problems:   Hyponatremia    Plan   ## Psychiatric Medication Recommendations Continue to manage medically with attention to correcting hyponatremia.  Engage her legal guardian for ongoing treatment decisions.  If ethical concerns escalate (e.g., ongoing medical deterioration with treatment refusal), recommend consulting the hospital ethics committee.  If appropriate, trial restarting psychiatric medications only after stabilization of medical issues, ensuring full disclosure to the patient to minimize suspicion and paranoia.   Risk vs. Benefit Analysis: Initiating psychotropic medications against her will at this time would not offer immediate benefit and may worsen her medical condition, particularly her hyponatremia.  Certain medications (e.g., SSRIs, antiepileptics, quetiapine , aripiprazole, clozapine, haloperidol ) are known to exacerbate hyponatremia and are not recommended until the underlying cause of electrolyte disturbance is fully identified and corrected.   ## Medical Decision Making Capacity: Not specifically addressed in this encounter  ## Further Work-up:  -- most recent EKG on 5/1 had QtC of 464 -- Pertinent labwork reviewed earlier this admission includes: CBC, Chem panel, urine and EKG   ## Disposition:-- There are no psychiatric contraindications to discharge at this time  ## Behavioral / Environmental: - No specific recommendations at this time.     ## Safety and Observation Level:  - Based on my clinical evaluation, I estimate the patient to be at low risk of self harm in the current setting. - At this time, we recommend  routine. This decision is based on my review of the chart including patient's history and current presentation, interview of the patient, mental status examination, and consideration of suicide risk including evaluating suicidal ideation, plan, intent, suicidal  or self-harm behaviors,  risk factors, and protective factors. This judgment is based on our ability to directly address suicide risk, implement suicide prevention strategies, and develop a safety plan while the patient is in the clinical setting. Please contact our team if there is a concern that risk level has changed.  CSSR Risk Category:C-SSRS RISK CATEGORY: No Risk  Suicide Risk Assessment: Patient has following modifiable risk factors for suicide: medication noncompliance, which we are addressing by monitoring her symptoms. Patient has following non-modifiable or demographic risk factors for suicide: psychiatric hospitalization Patient has the following protective factors against suicide: Supportive family  Thank you for this consult request. Recommendations have been communicated to the primary team.  We will sign off at this time.   Roslynn Coombes, NP       History of Present Illness  Relevant Aspects of [facility] Hospital Course:  Admitted on 11/02/2023 for hyponatremia. They were consulted for medication management which she does not want.  Patient Report:  The client was slightly irritable on assessment at times.  She stated, "I'm alright".  She slept "well", appetite is in the "middle".  Denied depression and anxiety along with hallucinations and suicidal ideations.  Minimal engagement in conversation, no distress noted.  Psych ROS:  Depression: denied Anxiety:  denied Mania (lifetime and current): past Psychosis: (lifetime and current): past   Review of Systems  All other systems reviewed and are negative.    Psychiatric and Social History  Psychiatric History:  Information collected from patient and chart.  Prev Dx/Sx: schizophrenia, undifferentiated Current Psych Provider: [facility] team noted Home Meds (current): Prozac, Risperdal ,Zyprexa  Previous Med Trials: Prolixin  Therapy: none  Prior Psych Hospitalization: multiple  Prior Self Harm: none Prior Violence: none  Family Psych History:  unknown Family Hx suicide: none  Social History:  Living Situation: nursing home  Access to weapons/lethal means: none   Substance History No substance use or history  Exam Findings  Physical Exam:  Vital Signs:  Temp:  [98.4 F (36.9 C)-98.8 F (37.1 C)] 98.4 F (36.9 C) (05/02 0351) Pulse Rate:  [69-87] 69 (05/02 0351) Resp:  [16-18] 18 (05/02 0351) BP: (104-123)/(72-93) 123/85 (05/02 0351) SpO2:  [97 %-100 %] 97 % (05/02 0351) Blood pressure 123/85, pulse 69, temperature 98.4 F (36.9 C), temperature source Oral, resp. rate 18, height 5' (1.524 m), weight 50 kg, SpO2 97%. Body mass index is 21.53 kg/m.  Physical Exam  Mental Status Exam: General Appearance: Casual  Orientation:  person and place  Memory:  Immediate;   Fair Recent;   Fair Remote;   Fair  Concentration:  Concentration: Fair and Attention Span: Fair  Recall:  Fair  Attention  Fair  Eye Contact:  Fair  Speech:  Normal Rate  Language:  Good  Volume:  Decreased  Mood: irritable at times  Affect:  Blunt  Thought Process:  Coherent  Thought Content:  Logical  Suicidal Thoughts:  No  Homicidal Thoughts:  No  Judgement:  Fair  Insight:  Lacking  Psychomotor Activity:  Decreased  Akathisia:  No  Fund of Knowledge:  Fair      Assets:  Leisure Time Resilience  Cognition:  Impaired,  Mild  ADL's:  Impaired  AIMS (if indicated):        Other History   These have been pulled in through the EMR, reviewed, and updated if appropriate.  Family History:  The patient's family history includes Breast cancer in her maternal aunt, maternal grandmother, paternal aunt, and paternal grandmother;  Diabetes in her paternal aunt and paternal grandmother; Hypertension in her father; Melanoma in her mother.  Medical History: Past Medical History:  Diagnosis Date   Allergy    Anemia    Gallstones    GERD (gastroesophageal reflux disease)    Hemorrhoids    melanoma with met dz dx'd 01/2018   brain and adrenal    Seasonal allergies    Stroke [facility])     Surgical History: Past Surgical History:  Procedure Laterality Date   BIOPSY  01/28/2018   Procedure: BIOPSY;  Surgeon: Jolinda Necessary, MD;  Location: [facility];  Service: Endoscopy;;   BREAST BIOPSY Left    ESOPHAGOGASTRODUODENOSCOPY N/A 01/28/2018   Procedure: ESOPHAGOGASTRODUODENOSCOPY (EGD);  Surgeon: Jolinda Necessary, MD;  Location: [facility];  Service: Endoscopy;  Laterality: N/A;   MOUTH SURGERY     OVARY SURGERY     removed "something"     Medications:   Current Facility-Administered Medications:    acetaminophen  (TYLENOL ) tablet 650 mg, 650 mg, Oral, Q6H PRN **OR** acetaminophen  (TYLENOL ) suppository 650 mg, 650 mg, Rectal, Q6H PRN, Lovell Rubenstein, NP   lip balm (CARMEX) ointment, , Topical, PRN, Vann, Jessica U, DO   ondansetron  (ZOFRAN ) tablet 4 mg, 4 mg, Oral, Q6H PRN **OR** ondansetron  (ZOFRAN ) injection 4 mg, 4 mg, Intravenous, Q6H PRN, Lovell Rubenstein, NP   sodium chloride  flush (NS) 0.9 % injection 3 mL, 3 mL, Intravenous, Q12H, Lovell Rubenstein, NP, 3 mL at 11/04/23 4098  Allergies: No Known Allergies  Roslynn Coombes, NP

## 2023-11-04 NOTE — Progress Notes (Addendum)
 PROGRESS NOTE    Kaitlyn Duncan  ZOX:[id number] DOB: 02-21-1971 DOA: 11/02/2023 PCP: Adelia Homestead, MD    Brief Narrative:  Kaitlyn Duncan is a 53 y.o. female with medical history significant of schizophrenia, GERD, anemia of chronic disease, chronic thrombocytopenia, history of strokes, and history of malignant melanoma with mets to the brain status postchemotherapy.  Patient sent to the ED via EMS after having 2 falls in 1 week.  According to facility staff she has not taken her medications in 6 months.  Found to have hyponatremia.    Assessment and Plan: Acute symptomatic hyponatremia- suspect SIADH -sodium in late 2024 was 137 and then in February was 129 -SIADH? -Workup has been further complicated by the fact that patient has recently been refusing follow-up MRI of the brain and PET scan as ordered by the oncology team Cortisol low s/p stim test--A peak cortisol level of less than 18 ?g/dL at 30 or 60 minutes post-cosyntropin  administration is indicative of adrenal insufficiency  - ACTH to differentiate between primary and secondary adrenal insufficiency. A plasma ACTH level more than twice the upper limit of normal suggests primary adrenal insufficiency, while a low or normal ACTH level indicates secondary adrenal insufficiency.  -will start hydrocortisone    Frequent falls Suspect secondary to volume depletion and possible orthostasis as well as hyponatremia Patient denied tripping prior to her fall but was unable to recall history of whether she became dizzy or weak prior to fall Physical therapy evaluation   Known malignant melanoma with brain metastasis status post chemotherapy -Stage IV melanoma diagnosed in July 2019.   - patient has not been following up consistently and although she did follow-up with the oncology team in February 2025 she did not keep her appointment for MRI brain and PET scan. -Dr. Marguerita Shih added to treatment team   Schizophrenia no  noncompliance with medical therapy/has legal guardian -difficult situation.   -Patient has not been taking her psychiatric medications for at least 6 months which has further worsened her underlying psychiatric condition -will retrial Risperdal  0.5 mg twice daily per psych recommendations  Chronic thrombocytopenia -trending up       DVT prophylaxis: SCDs Start: 11/02/23 1429    Code Status: Full Code   Disposition Plan:  Level of care: Med-Surg Status is: Inpatient     Consultants:  Palliative care   Subjective: Says a voodoo witch is messing with her  Objective: Vitals:   11/03/23 1114 11/03/23 1615 11/03/23 2024 11/04/23 0351  BP: (!) 122/93 108/76 104/72 123/85  Pulse: 87 76 80 69  Resp: 18 16 18 18   Temp: 98.4 F (36.9 C) 98.8 F (37.1 C) 98.5 F (36.9 C) 98.4 F (36.9 C)  TempSrc: Oral Oral Oral Oral  SpO2: 100% 97% 98% 97%  Weight:      Height:        Intake/Output Summary (Last 24 hours) at 11/04/2023 1228 Last data filed at 11/04/2023 8119 Gross per 24 hour  Intake 1600.21 ml  Output 200 ml  Net 1400.21 ml   Filed Weights   11/02/23 1029  Weight: 50 kg    Examination:   General: Appearance:    Chronically ill appearing female in no acute distress- dry lips     Lungs:     respirations unlabored  Heart:    Normal heart rate. Normal rhythm. No murmurs, rubs, or gallops.    MS:   All extremities are intact.    Neurologic:   Awake, alert  Data Reviewed: I have personally reviewed following labs and imaging studies  CBC: Recent Labs  Lab 11/02/23 1250 11/03/23 0520 11/04/23 0541  WBC 4.2 2.8* 2.7*  HGB 11.1* 12.1 10.6*  HCT 33.0* 35.8* 33.2*  MCV 80.5 80.3 84.5  PLT 67* 76* 72*   Basic Metabolic Panel: Recent Labs  Lab 11/02/23 1145 11/02/23 1908 11/03/23 0520 11/03/23 1124 11/03/23 2146 11/03/23 2355 11/04/23 0541  NA 120*   < > 124* 125* 124* 123* 129*  K 3.4*  --  3.7  --   --   --  3.5  CL 89*  --  94*  --   --    --  100  CO2 21*  --  20*  --   --   --  24  GLUCOSE 94  --  77  --   --   --  80  BUN 10  --  12  --   --   --  16  CREATININE 0.38*  --  0.53  --   --   --  0.53  CALCIUM  8.9  --  8.9  --   --   --  8.4*   < > = values in this interval not displayed.   GFR: Estimated Creatinine Clearance: 57.7 mL/min (by C-G formula based on SCr of 0.53 mg/dL). Liver Function Tests: Recent Labs  Lab 11/03/23 0520  AST 48*  ALT 23  ALKPHOS 79  BILITOT 1.0  PROT 7.1  ALBUMIN 4.0   No results for input(s): "LIPASE", "AMYLASE" in the last 168 hours. No results for input(s): "AMMONIA" in the last 168 hours. Coagulation Profile: No results for input(s): "INR", "PROTIME" in the last 168 hours. Cardiac Enzymes: No results for input(s): "CKTOTAL", "CKMB", "CKMBINDEX", "TROPONINI" in the last 168 hours. BNP (last 3 results) No results for input(s): "PROBNP" in the last 8760 hours. HbA1C: No results for input(s): "HGBA1C" in the last 72 hours. CBG: No results for input(s): "GLUCAP" in the last 168 hours. Lipid Profile: No results for input(s): "CHOL", "HDL", "LDLCALC", "TRIG", "CHOLHDL", "LDLDIRECT" in the last 72 hours. Thyroid  Function Tests: Recent Labs    11/02/23 1908  TSH 1.223   Anemia Panel: No results for input(s): "VITAMINB12", "FOLATE", "FERRITIN", "TIBC", "IRON", "RETICCTPCT" in the last 72 hours. Sepsis Labs: No results for input(s): "PROCALCITON", "LATICACIDVEN" in the last 168 hours.  No results found for this or any previous visit (from the past 240 hours).       Radiology Studies: MR BRAIN W WO CONTRAST Result Date: 11/03/2023 CLINICAL DATA:  Metastatic disease evaluation EXAM: MRI HEAD WITHOUT AND WITH CONTRAST TECHNIQUE: Multiplanar, multiecho pulse sequences of the brain and surrounding structures were obtained without and with intravenous contrast. CONTRAST:  5mL GADAVIST  GADOBUTROL  1 MMOL/ML IV SOLN COMPARISON:  CT head March 2, 25. FINDINGS: Brain: No acute  infarction, hemorrhage, hydrocephalus, extra-axial collection or mass lesion. Moderate patchy T2/FLAIR hyperintensities in the white matter nonspecific but compatible with chronic microvascular ischemic disease. Remote lacunar infarct in the left thalamus. Remote lacunar infarct in the right cerebellum. No pathologic enhancement. Mildly motion limited. Vascular: Major arterial flow voids are maintained. Skull and upper cervical spine: Normal marrow signal. Sinuses/Orbits: Negative. Other: No mastoid effusions. IMPRESSION: No evidence of acute intracranial abnormality or metastatic disease. Electronically Signed   By: Stevenson Elbe M.D.   On: 11/03/2023 10:04   DG Lumbar Spine Complete Result Date: 11/02/2023 CLINICAL DATA:  Low back pain after multiple falls in the last  week. EXAM: LUMBAR SPINE - COMPLETE 4+ VIEW COMPARISON:  September 24, 2021. FINDINGS: There is no evidence of lumbar spine fracture. Alignment is normal. Moderate degenerative disc disease is noted at L5-S1. IMPRESSION: Moderate degenerative disc disease at L5-S1. No acute abnormality seen. Electronically Signed   By: Rosalene Colon M.D.   On: 11/02/2023 13:00   DG HIP UNILAT W OR W/O PELVIS 2-3 VIEWS RIGHT Result Date: 11/02/2023 CLINICAL DATA:  Fall and trauma to the right lower extremity. EXAM: RIGHT FEMUR 2 VIEWS; DG HIP (WITH OR WITHOUT PELVIS) 2-3V RIGHT; RIGHT KNEE - COMPLETE 4+ VIEW COMPARISON:  None Available. FINDINGS: There is no acute fracture or dislocation. The bones are osteopenic. Mild arthritic changes of the right hip and knee. The soft tissues are unremarkable. IMPRESSION: 1. No acute fracture or dislocation. 2. Osteopenia. Electronically Signed   By: Angus Bark M.D.   On: 11/02/2023 12:55   DG Femur Min 2 Views Right Result Date: 11/02/2023 CLINICAL DATA:  Fall and trauma to the right lower extremity. EXAM: RIGHT FEMUR 2 VIEWS; DG HIP (WITH OR WITHOUT PELVIS) 2-3V RIGHT; RIGHT KNEE - COMPLETE 4+ VIEW COMPARISON:   None Available. FINDINGS: There is no acute fracture or dislocation. The bones are osteopenic. Mild arthritic changes of the right hip and knee. The soft tissues are unremarkable. IMPRESSION: 1. No acute fracture or dislocation. 2. Osteopenia. Electronically Signed   By: Angus Bark M.D.   On: 11/02/2023 12:55   DG Knee Complete 4 Views Right Result Date: 11/02/2023 CLINICAL DATA:  Fall and trauma to the right lower extremity. EXAM: RIGHT FEMUR 2 VIEWS; DG HIP (WITH OR WITHOUT PELVIS) 2-3V RIGHT; RIGHT KNEE - COMPLETE 4+ VIEW COMPARISON:  None Available. FINDINGS: There is no acute fracture or dislocation. The bones are osteopenic. Mild arthritic changes of the right hip and knee. The soft tissues are unremarkable. IMPRESSION: 1. No acute fracture or dislocation. 2. Osteopenia. Electronically Signed   By: Angus Bark M.D.   On: 11/02/2023 12:55   DG ELBOW COMPLETE RIGHT (3+VIEW) Result Date: 11/02/2023 CLINICAL DATA:  Two falls over the last week.  Right elbow pain. EXAM: RIGHT ELBOW - COMPLETE 3+ VIEW COMPARISON:  None Available. FINDINGS: There is no evidence of fracture, dislocation, or joint effusion. There is no evidence of arthropathy or other focal bone abnormality. Soft tissues are unremarkable. IMPRESSION: Negative right elbow radiographs Electronically Signed   By: Audree Leas M.D.   On: 11/02/2023 12:52        Scheduled Meds:  sodium chloride  flush  3 mL Intravenous Q12H   Continuous Infusions:   LOS: 2 days    Time spent: 45 minutes spent on chart review, discussion with nursing staff, consultants, updating family and interview/physical exam; more than 50% of that time was spent in counseling and/or coordination of care.    Enrigue Harvard, DO [facility] Available via Epic secure chat 7am-7pm After these hours, please refer to coverage provider listed on [email or web address] 11/04/2023, 12:28 PM

## 2023-11-04 NOTE — Evaluation (Signed)
 Physical Therapy Evaluation Patient Details Name: Kaitlyn Duncan MRN: [id number] DOB: 30-Jun-1971 Today's Date: 11/04/2023  History of Present Illness  Pt admitted from facility with acute symptomatic hyponatremia and with hx of CVA, schizophrenia, melanoma with brain mets s/p chemo, anemia of chronic disease, chonic thrombocytopenia, and multiple recent falls.  Clinical Impression  Pt admitted as above and presenting with functional mobility limitations 2* generalized weakness and balance deficits.  Pt very cooperative this am and up to ambulate in halls with RW and requiring only steady assist.  Pt should progress to dc back to prior living facility.        If plan is discharge home, recommend the following: A little help with walking and/or transfers;A little help with bathing/dressing/bathroom;Assistance with cooking/housework;Assist for transportation;Help with stairs or ramp for entrance   Can travel by private vehicle   No    Equipment Recommendations None recommended by PT  Recommendations for Other Services       Functional Status Assessment Patient has had a recent decline in their functional status and demonstrates the ability to make significant improvements in function in a reasonable and predictable amount of time.     Precautions / Restrictions Precautions Precautions: Fall Restrictions Weight Bearing Restrictions Per Provider Order: No      Mobility  Bed Mobility Overal bed mobility: Modified Independent             General bed mobility comments: no physical assist supine<>sit    Transfers Overall transfer level: Needs assistance   Transfers: Sit to/from Stand Sit to Stand: Min assist           General transfer comment: steady assist only    Ambulation/Gait Ambulation/Gait assistance: Min assist, Contact guard assist Gait Distance (Feet): 200 Feet Assistive device: Rolling walker (2 wheels) Gait Pattern/deviations: Step-to pattern,  Step-through pattern, Decreased step length - right, Decreased step length - left, Shuffle, Trunk flexed Gait velocity: decreased but functional     General Gait Details: Steady assist with min cues for posture and position from [employer]            Wheelchair Mobility     Tilt Bed    Modified Rankin (Stroke Patients Only)       Balance Overall balance assessment: Needs assistance Sitting-balance support: No upper extremity supported, Feet supported Sitting balance-Leahy Scale: Good     Standing balance support: Bilateral upper extremity supported Standing balance-Leahy Scale: Poor                               Pertinent Vitals/Pain Pain Assessment Pain Assessment: No/denies pain    Home Living Family/patient expects to be discharged to:: Skilled nursing facility                   Additional Comments: Per chart, pt admitted from [facility]    Prior Function Prior Level of Function : Patient poor historian/Family not available             Mobility Comments: Pt reports using RW prior to admit but has told other staff she does not and refused to use same       Extremity/Trunk Assessment   Upper Extremity Assessment Upper Extremity Assessment: Generalized weakness    Lower Extremity Assessment Lower Extremity Assessment: Generalized weakness    Cervical / Trunk Assessment Cervical / Trunk Assessment: Kyphotic  Communication   Communication Communication: Impaired Factors Affecting Communication:  Reduced clarity of speech (mumbles)    Cognition Arousal: Alert Behavior During Therapy: Flat affect, Restless   PT - Cognitive impairments: No family/caregiver present to determine baseline, History of cognitive impairments                         Following commands: Intact       Cueing Cueing Techniques: Verbal cues     General Comments      Exercises     Assessment/Plan    PT Assessment Patient  needs continued PT services  PT Problem List Decreased strength;Decreased activity tolerance;Decreased balance;Decreased mobility;Decreased knowledge of use of DME;Decreased safety awareness       PT Treatment Interventions DME instruction;Gait training;Functional mobility training;Therapeutic activities;Therapeutic exercise;Patient/family education;Balance training    PT Goals (Current goals can be found in the Care Plan section)  Acute Rehab PT Goals Patient Stated Goal: No specific goals expressed but agreeable to participate with PT PT Goal Formulation: With patient Time For Goal Achievement: 11/18/23 Potential to Achieve Goals: Good    Frequency Min 3X/week     Co-evaluation               AM-PAC PT "6 Clicks" Mobility  Outcome Measure Help needed turning from your back to your side while in a flat bed without using bedrails?: None Help needed moving from lying on your back to sitting on the side of a flat bed without using bedrails?: None Help needed moving to and from a bed to a chair (including a wheelchair)?: A Little Help needed standing up from a chair using your arms (e.g., wheelchair or bedside chair)?: A Little Help needed to walk in hospital room?: A Little Help needed climbing 3-5 steps with a railing? : A Lot 6 Click Score: 19    End of Session Equipment Utilized During Treatment: Gait belt Activity Tolerance: Patient tolerated treatment well Patient left: in bed;with call bell/phone within reach;with bed alarm set Nurse Communication: Mobility status PT Visit Diagnosis: Difficulty in walking, not elsewhere classified (R26.2);Unsteadiness on feet (R26.81);Muscle weakness (generalized) (M62.81)    Time: 1100-1120 PT Time Calculation (min) (ACUTE ONLY): 20 min   Charges:   PT Evaluation $PT Eval Low Complexity: 1 Low   PT General Charges $$ ACUTE PT VISIT: 1 Visit         Thedora Finlay PT Acute Rehabilitation Services Pager  [phone] Office [phone]   Ilma Achee 11/04/2023, 12:06 PM

## 2023-11-05 DIAGNOSIS — E871 Hypo-osmolality and hyponatremia: Secondary | ICD-10-CM | POA: Diagnosis not present

## 2023-11-05 LAB — BASIC METABOLIC PANEL WITH GFR
Anion gap: 10 (ref 5–15)
BUN: 12 mg/dL (ref 6–20)
CO2: 21 mmol/L — ABNORMAL LOW (ref 22–32)
Calcium: 8.9 mg/dL (ref 8.9–10.3)
Chloride: 99 mmol/L (ref 98–111)
Creatinine, Ser: 0.49 mg/dL (ref 0.44–1.00)
GFR, Estimated: >60 mL/min (ref >60)
Glucose, Bld: 97 mg/dL (ref 70–99)
Potassium: 3.5 mmol/L (ref 3.5–5.1)
Sodium: 130 mmol/L — ABNORMAL LOW (ref 135–145)

## 2023-11-05 LAB — CBC
HCT: 33.9 % — ABNORMAL LOW (ref 36.0–46.0)
Hemoglobin: 11.2 g/dL — ABNORMAL LOW (ref 12.0–15.0)
MCH: 26.9 pg (ref 26.0–34.0)
MCHC: 33 g/dL (ref 30.0–36.0)
MCV: 81.5 fL (ref 80.0–100.0)
Platelets: 73 10*3/uL — ABNORMAL LOW (ref 150–400)
RBC: 4.16 MIL/uL (ref 3.87–5.11)
RDW: 13 % (ref 11.5–15.5)
WBC: 3.2 10*3/uL — ABNORMAL LOW (ref 4.0–10.5)
nRBC: 0 % (ref 0.0–0.2)

## 2023-11-05 NOTE — Progress Notes (Signed)
 PROGRESS NOTE    Kaitlyn Duncan  WUJ:[id number] DOB: 09/27/1970 DOA: 11/02/2023 PCP: Adelia Homestead, MD    Brief Narrative:  Kaitlyn Duncan is a 53 y.o. female with medical history significant of schizophrenia, GERD, anemia of chronic disease, chronic thrombocytopenia, history of strokes, and history of malignant melanoma with mets to the brain status postchemotherapy.  Patient sent to the ED via EMS after having 2 falls in 1 week.  According to facility staff she has not taken her medications in 6 months.  Found to have hyponatremia.    Assessment and Plan: Acute symptomatic hyponatremia- suspect SIADH -sodium in late 2024 was 137 and then in February was 129 -SIADH? -Workup has been further complicated by the fact that patient has recently been refusing follow-up MRI of the brain and PET scan as ordered by the oncology team Cortisol low s/p stim test--A peak cortisol level of less than 18 ?g/dL at 30 or 60 minutes post-cosyntropin  administration is indicative of adrenal insufficiency  - ACTH to differentiate between primary and secondary adrenal insufficiency. A plasma ACTH level more than twice the upper limit of normal suggests primary adrenal insufficiency, while a low or normal ACTH level indicates secondary adrenal insufficiency.  -started hydrocortisone    Frequent falls Suspect secondary to volume depletion and possible orthostasis as well as hyponatremia Patient denied tripping prior to her fall but was unable to recall history of whether she became dizzy or weak prior to fall Physical therapy evaluation-- SNF placement   Known malignant melanoma with brain metastasis status post chemotherapy -Stage IV melanoma diagnosed in July 2019.   - patient has not been following up consistently and although she did follow-up with the oncology team in February 2025 she did not keep her appointment for MRI brain and PET scan. -Dr. Marguerita Shih added to treatment team    Schizophrenia no noncompliance with medical therapy/has legal guardian -difficult situation.   -Patient has not been taking her psychiatric medications for at least 6 months which has further worsened her underlying psychiatric condition -will retrial Risperdal  0.5 mg twice daily per psych recommendations-- has been compliant  Chronic thrombocytopenia -trending up       DVT prophylaxis: SCDs Start: 11/02/23 1429    Code Status: Full Code   Disposition Plan:  Level of care: Med-Surg Status is: Inpatient     Consultants:  Palliative care   Subjective: Says she did not get food yesterday  Objective: Vitals:   11/04/23 0351 11/04/23 1354 11/04/23 2042 11/05/23 0613  BP: 123/85 130/86 122/86 108/76  Pulse: 69 73 73 84  Resp: 18 14 18 18   Temp: 98.4 F (36.9 C) 98.7 F (37.1 C) 97.7 F (36.5 C) 97.7 F (36.5 C)  TempSrc: Oral Oral Oral Oral  SpO2: 97% 98% 99% 96%  Weight:      Height:        Intake/Output Summary (Last 24 hours) at 11/05/2023 1201 Last data filed at 11/05/2023 0600 Gross per 24 hour  Intake --  Output 850 ml  Net -850 ml   Filed Weights   11/02/23 1029  Weight: 50 kg    Examination:   General: Appearance:    Well developed, well nourished female in no acute distress     Lungs:     respirations unlabored  Heart:    Normal heart rate.  MS:   All extremities are intact.   Neurologic:   Awake, alert       Data Reviewed: I have personally  reviewed following labs and imaging studies  CBC: Recent Labs  Lab 11/02/23 1250 11/03/23 0520 11/04/23 0541 11/05/23 0941  WBC 4.2 2.8* 2.7* 3.2*  HGB 11.1* 12.1 10.6* 11.2*  HCT 33.0* 35.8* 33.2* 33.9*  MCV 80.5 80.3 84.5 81.5  PLT 67* 76* 72* 73*   Basic Metabolic Panel: Recent Labs  Lab 11/02/23 1145 11/02/23 1908 11/03/23 0520 11/03/23 1124 11/03/23 2146 11/03/23 2355 11/04/23 0541 11/05/23 0941  NA 120*   < > 124* 125* 124* 123* 129* 130*  K 3.4*  --  3.7  --   --   --  3.5  3.5  CL 89*  --  94*  --   --   --  100 99  CO2 21*  --  20*  --   --   --  24 21*  GLUCOSE 94  --  77  --   --   --  80 97  BUN 10  --  12  --   --   --  16 12  CREATININE 0.38*  --  0.53  --   --   --  0.53 0.49  CALCIUM  8.9  --  8.9  --   --   --  8.4* 8.9   < > = values in this interval not displayed.   GFR: Estimated Creatinine Clearance: 57.7 mL/min (by C-G formula based on SCr of 0.49 mg/dL). Liver Function Tests: Recent Labs  Lab 11/03/23 0520  AST 48*  ALT 23  ALKPHOS 79  BILITOT 1.0  PROT 7.1  ALBUMIN 4.0   No results for input(s): "LIPASE", "AMYLASE" in the last 168 hours. No results for input(s): "AMMONIA" in the last 168 hours. Coagulation Profile: No results for input(s): "INR", "PROTIME" in the last 168 hours. Cardiac Enzymes: No results for input(s): "CKTOTAL", "CKMB", "CKMBINDEX", "TROPONINI" in the last 168 hours. BNP (last 3 results) No results for input(s): "PROBNP" in the last 8760 hours. HbA1C: No results for input(s): "HGBA1C" in the last 72 hours. CBG: No results for input(s): "GLUCAP" in the last 168 hours. Lipid Profile: No results for input(s): "CHOL", "HDL", "LDLCALC", "TRIG", "CHOLHDL", "LDLDIRECT" in the last 72 hours. Thyroid  Function Tests: Recent Labs    11/02/23 1908  TSH 1.223   Anemia Panel: No results for input(s): "VITAMINB12", "FOLATE", "FERRITIN", "TIBC", "IRON", "RETICCTPCT" in the last 72 hours. Sepsis Labs: No results for input(s): "PROCALCITON", "LATICACIDVEN" in the last 168 hours.  No results found for this or any previous visit (from the past 240 hours).       Radiology Studies: No results found.       Scheduled Meds:  hydrocortisone   10 mg Oral q AM   hydrocortisone   5 mg Oral Daily   risperiDONE   0.5 mg Oral BID   sodium chloride  flush  3 mL Intravenous Q12H   Continuous Infusions:   LOS: 3 days    Time spent: 45 minutes spent on chart review, discussion with nursing staff, consultants, updating  family and interview/physical exam; more than 50% of that time was spent in counseling and/or coordination of care.    Enrigue Harvard, DO [facility] Available via Epic secure chat 7am-7pm After these hours, please refer to coverage provider listed on [email or web address] 11/05/2023, 12:01 PM

## 2023-11-05 NOTE — Plan of Care (Signed)

## 2023-11-05 NOTE — Plan of Care (Signed)
   Problem: Activity: Goal: Risk for activity intolerance will decrease Outcome: Progressing   Problem: Nutrition: Goal: Adequate nutrition will be maintained Outcome: Progressing   Problem: Elimination: Goal: Will not experience complications related to bowel motility Outcome: Progressing   Problem: Safety: Goal: Ability to remain free from injury will improve Outcome: Progressing

## 2023-11-06 DIAGNOSIS — E871 Hypo-osmolality and hyponatremia: Secondary | ICD-10-CM | POA: Diagnosis not present

## 2023-11-06 LAB — BASIC METABOLIC PANEL WITH GFR
Anion gap: 7 (ref 5–15)
BUN: 15 mg/dL (ref 6–20)
CO2: 24 mmol/L (ref 22–32)
Calcium: 9.2 mg/dL (ref 8.9–10.3)
Chloride: 103 mmol/L (ref 98–111)
Creatinine, Ser: 0.58 mg/dL (ref 0.44–1.00)
GFR, Estimated: >60 mL/min (ref >60)
Glucose, Bld: 82 mg/dL (ref 70–99)
Potassium: 3.8 mmol/L (ref 3.5–5.1)
Sodium: 134 mmol/L — ABNORMAL LOW (ref 135–145)

## 2023-11-06 LAB — CBC
HCT: 34.7 % — ABNORMAL LOW (ref 36.0–46.0)
Hemoglobin: 11 g/dL — ABNORMAL LOW (ref 12.0–15.0)
MCH: 27.2 pg (ref 26.0–34.0)
MCHC: 31.7 g/dL (ref 30.0–36.0)
MCV: 85.7 fL (ref 80.0–100.0)
Platelets: 79 10*3/uL — ABNORMAL LOW (ref 150–400)
RBC: 4.05 MIL/uL (ref 3.87–5.11)
RDW: 13.2 % (ref 11.5–15.5)
WBC: 3.4 10*3/uL — ABNORMAL LOW (ref 4.0–10.5)
nRBC: 0 % (ref 0.0–0.2)

## 2023-11-06 MED ORDER — SENNOSIDES-DOCUSATE SODIUM 8.6-50 MG PO TABS
1.0000 | ORAL_TABLET | Freq: Two times a day (BID) | ORAL | Status: DC
Start: 2023-11-06 — End: 2023-11-10
  Administered 2023-11-06 – 2023-11-10 (×7): 1 via ORAL
  Filled 2023-11-06 (×9): qty 1

## 2023-11-06 NOTE — Plan of Care (Signed)
  Problem: Health Behavior/Discharge Planning: Goal: Ability to manage health-related needs will improve Outcome: Progressing   Problem: Clinical Measurements: Goal: Ability to maintain clinical measurements within normal limits will improve Outcome: Progressing Goal: Will remain free from infection Outcome: Progressing   Problem: Education: Goal: Knowledge of General Education information will improve Description: Including pain rating scale, medication(s)/side effects and non-pharmacologic comfort measures Outcome: Not Progressing

## 2023-11-06 NOTE — Plan of Care (Signed)

## 2023-11-06 NOTE — Progress Notes (Signed)
 PROGRESS NOTE    Kaitlyn Duncan  ZOX:[id number] DOB: 1971-06-20 DOA: 11/02/2023 PCP: Adelia Homestead, MD    Brief Narrative:  Kaitlyn Duncan is a 53 y.o. female with medical history significant of schizophrenia, GERD, anemia of chronic disease, chronic thrombocytopenia, history of strokes, and history of malignant melanoma with mets to the brain status postchemotherapy.  Patient sent to the ED via EMS after having 2 falls in 1 week.  According to facility staff she has not taken her medications in 6 months.  Found to have hyponatremia and was also found to have adrenal insufficiency.  Have started psych's been compliant with treatment thus far  Assessment and Plan: Acute symptomatic hyponatremia- suspect SIADH -sodium in late 2024 was 137 and then in February was 129 -SIADH? -Workup has been further complicated by the fact that patient has recently been refusing follow-up MRI of the brain and PET scan as ordered by the oncology team Cortisol low s/p stim test--A peak cortisol level of less than 18 ?g/dL at 30 or 60 minutes post-cosyntropin  administration is indicative of adrenal insufficiency  - ACTH pending to differentiate between primary and secondary adrenal insufficiency. A plasma ACTH level more than twice the upper limit of normal suggests primary adrenal insufficiency, while a low or normal ACTH level indicates secondary adrenal insufficiency.  -started hydrocortisone    Frequent falls Suspect secondary to volume depletion and possible orthostasis as well as hyponatremia Patient denied tripping prior to her fall but was unable to recall history of whether she became dizzy or weak prior to fall Physical therapy evaluation-- SNF placement   Known malignant melanoma with brain metastasis status post chemotherapy -Stage IV melanoma diagnosed in July 2019.   - patient has not been following up consistently and although she did follow-up with the oncology team in  February 2025 she did not keep her appointment for MRI brain and PET scan. -Dr. Marguerita Shih added to treatment team   Schizophrenia no noncompliance with medical therapy/has legal guardian -difficult situation.   -Patient has not been taking her psychiatric medications for at least 6 months which has further worsened her underlying psychiatric condition -will retrial Risperdal  0.5 mg twice daily per psych recommendations-- has been compliant with medication thus far  Chronic thrombocytopenia -trending up       DVT prophylaxis: SCDs Start: 11/02/23 1429    Code Status: Full Code   Disposition Plan:  Level of care: Med-Surg Status is: Inpatient     Consultants:  Palliative care   Subjective: Feeling much better today and hungry  Objective: Vitals:   11/05/23 0613 11/05/23 1329 11/05/23 2058 11/06/23 0618  BP: 108/76 111/73 114/80 104/78  Pulse: 84 83 74 72  Resp: 18 16 16 14   Temp: 97.7 F (36.5 C) 98.4 F (36.9 C) 97.8 F (36.6 C) 97.6 F (36.4 C)  TempSrc: Oral Oral Oral Oral  SpO2: 96% 97% 98% 99%  Weight:      Height:       No intake or output data in the 24 hours ending 11/06/23 1139  Filed Weights   11/02/23 1029  Weight: 50 kg    Examination:   General: Appearance:    Well developed, well nourished female in no acute distress     Lungs:     respirations unlabored  Heart:    Normal heart rate.  MS:   All extremities are intact.   Neurologic:   Awake, alert       Data Reviewed: I have  personally reviewed following labs and imaging studies  CBC: Recent Labs  Lab 11/02/23 1250 11/03/23 0520 11/04/23 0541 11/05/23 0941 11/06/23 0709  WBC 4.2 2.8* 2.7* 3.2* 3.4*  HGB 11.1* 12.1 10.6* 11.2* 11.0*  HCT 33.0* 35.8* 33.2* 33.9* 34.7*  MCV 80.5 80.3 84.5 81.5 85.7  PLT 67* 76* 72* 73* 79*   Basic Metabolic Panel: Recent Labs  Lab 11/02/23 1145 11/02/23 1908 11/03/23 0520 11/03/23 1124 11/03/23 2146 11/03/23 2355 11/04/23 0541  11/05/23 0941 11/06/23 0709  NA 120*   < > 124*   < > 124* 123* 129* 130* 134*  K 3.4*  --  3.7  --   --   --  3.5 3.5 3.8  CL 89*  --  94*  --   --   --  100 99 103  CO2 21*  --  20*  --   --   --  24 21* 24  GLUCOSE 94  --  77  --   --   --  80 97 82  BUN 10  --  12  --   --   --  16 12 15   CREATININE 0.38*  --  0.53  --   --   --  0.53 0.49 0.58  CALCIUM  8.9  --  8.9  --   --   --  8.4* 8.9 9.2   < > = values in this interval not displayed.   GFR: Estimated Creatinine Clearance: 57.7 mL/min (by C-G formula based on SCr of 0.58 mg/dL). Liver Function Tests: Recent Labs  Lab 11/03/23 0520  AST 48*  ALT 23  ALKPHOS 79  BILITOT 1.0  PROT 7.1  ALBUMIN 4.0   No results for input(s): "LIPASE", "AMYLASE" in the last 168 hours. No results for input(s): "AMMONIA" in the last 168 hours. Coagulation Profile: No results for input(s): "INR", "PROTIME" in the last 168 hours. Cardiac Enzymes: No results for input(s): "CKTOTAL", "CKMB", "CKMBINDEX", "TROPONINI" in the last 168 hours. BNP (last 3 results) No results for input(s): "PROBNP" in the last 8760 hours. HbA1C: No results for input(s): "HGBA1C" in the last 72 hours. CBG: No results for input(s): "GLUCAP" in the last 168 hours. Lipid Profile: No results for input(s): "CHOL", "HDL", "LDLCALC", "TRIG", "CHOLHDL", "LDLDIRECT" in the last 72 hours. Thyroid  Function Tests: No results for input(s): "TSH", "T4TOTAL", "FREET4", "T3FREE", "THYROIDAB" in the last 72 hours.  Anemia Panel: No results for input(s): "VITAMINB12", "FOLATE", "FERRITIN", "TIBC", "IRON", "RETICCTPCT" in the last 72 hours. Sepsis Labs: No results for input(s): "PROCALCITON", "LATICACIDVEN" in the last 168 hours.  No results found for this or any previous visit (from the past 240 hours).       Radiology Studies: No results found.       Scheduled Meds:  hydrocortisone   10 mg Oral q AM   hydrocortisone   5 mg Oral Daily   risperiDONE   0.5 mg Oral  BID   sodium chloride  flush  3 mL Intravenous Q12H   Continuous Infusions:   LOS: 4 days    Time spent: 45 minutes spent on chart review, discussion with nursing staff, consultants, updating family and interview/physical exam; more than 50% of that time was spent in counseling and/or coordination of care.    Enrigue Harvard, DO [facility] Available via Epic secure chat 7am-7pm After these hours, please refer to coverage provider listed on [email or web address] 11/06/2023, 11:39 AM

## 2023-11-07 DIAGNOSIS — E871 Hypo-osmolality and hyponatremia: Secondary | ICD-10-CM | POA: Diagnosis not present

## 2023-11-07 LAB — BASIC METABOLIC PANEL WITH GFR
Anion gap: 8 (ref 5–15)
BUN: 12 mg/dL (ref 6–20)
CO2: 24 mmol/L (ref 22–32)
Calcium: 8.6 mg/dL — ABNORMAL LOW (ref 8.9–10.3)
Chloride: 100 mmol/L (ref 98–111)
Creatinine, Ser: 0.43 mg/dL — ABNORMAL LOW (ref 0.44–1.00)
GFR, Estimated: >60 mL/min (ref >60)
Glucose, Bld: 81 mg/dL (ref 70–99)
Potassium: 3.7 mmol/L (ref 3.5–5.1)
Sodium: 132 mmol/L — ABNORMAL LOW (ref 135–145)

## 2023-11-07 LAB — CBC
HCT: 32.6 % — ABNORMAL LOW (ref 36.0–46.0)
Hemoglobin: 10.3 g/dL — ABNORMAL LOW (ref 12.0–15.0)
MCH: 27 pg (ref 26.0–34.0)
MCHC: 31.6 g/dL (ref 30.0–36.0)
MCV: 85.6 fL (ref 80.0–100.0)
Platelets: 79 10*3/uL — ABNORMAL LOW (ref 150–400)
RBC: 3.81 MIL/uL — ABNORMAL LOW (ref 3.87–5.11)
RDW: 13.2 % (ref 11.5–15.5)
WBC: 3.4 10*3/uL — ABNORMAL LOW (ref 4.0–10.5)
nRBC: 0 % (ref 0.0–0.2)

## 2023-11-07 LAB — ACTH: C206 ACTH: 4.3 pg/mL — ABNORMAL LOW (ref 7.2–63.3)

## 2023-11-07 MED ORDER — HALOPERIDOL LACTATE 5 MG/ML IJ SOLN: 2.0000 mg | Freq: Three times a day (TID) | INTRAMUSCULAR | Status: DC | PRN

## 2023-11-07 MED ORDER — HALOPERIDOL 0.5 MG PO TABS
2.0000 mg | ORAL_TABLET | Freq: Three times a day (TID) | ORAL | Status: DC | PRN
Start: 2023-11-07 — End: 2023-11-08

## 2023-11-07 NOTE — Plan of Care (Signed)

## 2023-11-07 NOTE — Progress Notes (Signed)
 Physical Therapy Treatment Patient Details Name: Kaitlyn Duncan MRN: [id number] DOB: September 22, 1970 Today's Date: 11/07/2023   History of Present Illness Pt admitted from facility with acute symptomatic hyponatremia and with hx of CVA, schizophrenia, melanoma with brain mets s/p chemo, anemia of chronic disease, chonic thrombocytopenia, and multiple recent falls.    PT Comments  Pt self directed with gait and transfers, verbal cues for RW management to improve safety. Pt needing min A to power up to stand from EOB and toilet, noted BLE bracing against bed to support powering up. Pt with forward flexed trunk, utilized mirror for visual feedback to improve posture without success. Pt takes slow shuffling steps to restroom then back to bed. Pt with low frustration tolerance, self directed in mobility and declines gait belt for safety despite education. Cont to recommend inpatient follow up therapy, <3 hours/day.    If plan is discharge home, recommend the following: A little help with walking and/or transfers;A little help with bathing/dressing/bathroom;Assistance with cooking/housework;Assist for transportation;Help with stairs or ramp for entrance   Can travel by private vehicle     No  Equipment Recommendations  None recommended by PT    Recommendations for Other Services       Precautions / Restrictions Precautions Precautions: Fall Restrictions Weight Bearing Restrictions Per Provider Order: No     Mobility  Bed Mobility Overal bed mobility: Modified Independent             General bed mobility comments: slowly comes to sitting EOB with bedrail, no physical assist    Transfers Overall transfer level: Needs assistance   Transfers: Sit to/from Stand Sit to Stand: Min assist           General transfer comment: min A to power to stand from EOB and toilet, verbal cues for hand placement and sequencing with mobility    Ambulation/Gait Ambulation/Gait assistance:  Min assist, Contact guard assist Gait Distance (Feet): 12 Feet (x2) Assistive device: Rolling walker (2 wheels) Gait Pattern/deviations: Shuffle, Trunk flexed Gait velocity: decreased     General Gait Details: trunk flexed with slow shuffling step progression, min A to steady and for RW management close to body, cues to maintain body within RW frame, increasead time and steps with turns   Comptroller Bed    Modified Rankin (Stroke Patients Only)       Balance Overall balance assessment: Needs assistance Sitting-balance support: No upper extremity supported, Feet supported Sitting balance-Leahy Scale: Good     Standing balance support: Bilateral upper extremity supported, During functional activity, Reliant on assistive device for balance Standing balance-Leahy Scale: Poor                              Communication Communication Communication: No apparent difficulties  Cognition Arousal: Alert Behavior During Therapy: Flat affect, Restless, Agitated   PT - Cognitive impairments: No family/caregiver present to determine baseline, History of cognitive impairments                       PT - Cognition Comments: pt self directed, low frustration tolerance Following commands: Intact      Cueing Cueing Techniques: Verbal cues  Exercises      General Comments        Pertinent Vitals/Pain Pain Assessment Pain Assessment: No/denies pain  Home Living                          Prior Function            PT Goals (current goals can now be found in the care plan section) Acute Rehab PT Goals Patient Stated Goal: No specific goals expressed but agreeable to participate with PT PT Goal Formulation: With patient Time For Goal Achievement: 11/18/23 Potential to Achieve Goals: Good Progress towards PT goals: Progressing toward goals    Frequency    Min 3X/week      PT Plan       Co-evaluation              AM-PAC PT "6 Clicks" Mobility   Outcome Measure  Help needed turning from your back to your side while in a flat bed without using bedrails?: None Help needed moving from lying on your back to sitting on the side of a flat bed without using bedrails?: None Help needed moving to and from a bed to a chair (including a wheelchair)?: A Little Help needed standing up from a chair using your arms (e.g., wheelchair or bedside chair)?: A Little Help needed to walk in hospital room?: A Little Help needed climbing 3-5 steps with a railing? : A Lot 6 Click Score: 19    End of Session Equipment Utilized During Treatment:  (pt declines gait belt) Activity Tolerance: Patient tolerated treatment well Patient left: in bed;with call bell/phone within reach;with bed alarm set Nurse Communication: Mobility status;Other (comment) (voided bladder) PT Visit Diagnosis: Difficulty in walking, not elsewhere classified (R26.2);Unsteadiness on feet (R26.81);Muscle weakness (generalized) (M62.81)     Time: 1610-9604 PT Time Calculation (min) (ACUTE ONLY): 12 min  Charges:    $Gait Training: 8-22 mins PT General Charges $$ ACUTE PT VISIT: 1 Visit                     Tori Zaydee Aina PT, DPT 11/07/23, 12:24 PM

## 2023-11-07 NOTE — Plan of Care (Signed)

## 2023-11-07 NOTE — Progress Notes (Signed)
 PROGRESS NOTE    Kaitlyn Duncan  XLK:[id number] DOB: 1971-05-01 DOA: 11/02/2023 PCP: Adelia Homestead, MD    Brief Narrative:  Kaitlyn Duncan is a 53 y.o. female with medical history significant of schizophrenia, GERD, anemia of chronic disease, chronic thrombocytopenia, history of strokes, and history of malignant melanoma with mets to the brain status postchemotherapy.  Patient sent to the ED via EMS after having 2 falls in 1 week.  According to facility staff she has not taken her medications in 6 months.  Found to have hyponatremia and was also found to have adrenal insufficiency.  Have started psych medications and patient has been compliant with treatment thus far.  Assessment and Plan: Acute symptomatic hyponatremia- suspect SIADH -sodium in late 2024 was 137 and then in February was 129 Cortisol low s/p stim test--A peak cortisol level of less than 18 ?g/dL at 30 or 60 minutes post-cosyntropin  administration is indicative of adrenal insufficiency: 11 at 30 minutes: 13.9 at 60 minutes - ACTH pending to differentiate between primary and secondary adrenal insufficiency. A plasma ACTH level more than twice the upper limit of normal suggests primary adrenal insufficiency, while a low or normal ACTH level indicates secondary adrenal insufficiency.  -started hydrocortisone  and will monitor symptoms   Frequent falls Suspect secondary to volume depletion and possible orthostasis as well as hyponatremia Patient denied tripping prior to her fall but was unable to recall history of whether she became dizzy or weak prior to fall Physical therapy evaluation-- SNF placement-TOC consulted and continue to await note   Known malignant melanoma with brain metastasis status post chemotherapy -Stage IV melanoma diagnosed in July 2019.   - patient has not been following up consistently and although she did follow-up with the oncology team in February 2025 she did not keep her appointment  for MRI brain and PET scan. -Dr. Marguerita Shih added to treatment team   Schizophrenia no noncompliance with medical therapy/has legal guardian -difficult situation.   -Patient has not been taking her psychiatric medications for at least 6 months which has further worsened her underlying psychiatric condition -will retrial Risperdal  0.5 mg twice daily per psych recommendations-- has been compliant with medication thus far  Chronic thrombocytopenia - Stable       DVT prophylaxis: SCDs Start: 11/02/23 1429    Code Status: Full Code Called legal guardian with no answer and no ability to leave message  Disposition Plan:  Level of care: Med-Surg Status is: Inpatient     Consultants:  Psych   Subjective: Does not recall the conversations we had this weekend and patient very irritable and uncooperative with me today  Objective: Vitals:   11/06/23 0618 11/06/23 1455 11/06/23 2123 11/07/23 0500  BP: 104/78 107/69 111/78 120/84  Pulse: 72 84 68 69  Resp: 14 12 16 16   Temp: 97.6 F (36.4 C) 98.5 F (36.9 C) 98 F (36.7 C) 97.8 F (36.6 C)  TempSrc: Oral Oral Oral Oral  SpO2: 99% 97% 99% 98%  Weight:      Height:        Intake/Output Summary (Last 24 hours) at 11/07/2023 1056 Last data filed at 11/07/2023 0944 Gross per 24 hour  Intake 480 ml  Output --  Net 480 ml    Filed Weights   11/02/23 1029  Weight: 50 kg    Examination:  In bed, irritable and uncooperative, refusing eval    Data Reviewed: I have personally reviewed following labs and imaging studies  CBC: Recent Labs  Lab 11/03/23 0520 11/04/23 0541 11/05/23 0941 11/06/23 0709 11/07/23 0523  WBC 2.8* 2.7* 3.2* 3.4* 3.4*  HGB 12.1 10.6* 11.2* 11.0* 10.3*  HCT 35.8* 33.2* 33.9* 34.7* 32.6*  MCV 80.3 84.5 81.5 85.7 85.6  PLT 76* 72* 73* 79* 79*   Basic Metabolic Panel: Recent Labs  Lab 11/03/23 0520 11/03/23 1124 11/03/23 2355 11/04/23 0541 11/05/23 0941 11/06/23 0709 11/07/23 0523  NA 124*    < > 123* 129* 130* 134* 132*  K 3.7  --   --  3.5 3.5 3.8 3.7  CL 94*  --   --  100 99 103 100  CO2 20*  --   --  24 21* 24 24  GLUCOSE 77  --   --  80 97 82 81  BUN 12  --   --  16 12 15 12   CREATININE 0.53  --   --  0.53 0.49 0.58 0.43*  CALCIUM  8.9  --   --  8.4* 8.9 9.2 8.6*   < > = values in this interval not displayed.   GFR: Estimated Creatinine Clearance: 57.7 mL/min (A) (by C-G formula based on SCr of 0.43 mg/dL (L)). Liver Function Tests: Recent Labs  Lab 11/03/23 0520  AST 48*  ALT 23  ALKPHOS 79  BILITOT 1.0  PROT 7.1  ALBUMIN 4.0   No results for input(s): "LIPASE", "AMYLASE" in the last 168 hours. No results for input(s): "AMMONIA" in the last 168 hours. Coagulation Profile: No results for input(s): "INR", "PROTIME" in the last 168 hours. Cardiac Enzymes: No results for input(s): "CKTOTAL", "CKMB", "CKMBINDEX", "TROPONINI" in the last 168 hours. BNP (last 3 results) No results for input(s): "PROBNP" in the last 8760 hours. HbA1C: No results for input(s): "HGBA1C" in the last 72 hours. CBG: No results for input(s): "GLUCAP" in the last 168 hours. Lipid Profile: No results for input(s): "CHOL", "HDL", "LDLCALC", "TRIG", "CHOLHDL", "LDLDIRECT" in the last 72 hours. Thyroid  Function Tests: No results for input(s): "TSH", "T4TOTAL", "FREET4", "T3FREE", "THYROIDAB" in the last 72 hours.  Anemia Panel: No results for input(s): "VITAMINB12", "FOLATE", "FERRITIN", "TIBC", "IRON", "RETICCTPCT" in the last 72 hours. Sepsis Labs: No results for input(s): "PROCALCITON", "LATICACIDVEN" in the last 168 hours.  No results found for this or any previous visit (from the past 240 hours).       Radiology Studies: No results found.       Scheduled Meds:  hydrocortisone   10 mg Oral q AM   hydrocortisone   5 mg Oral Daily   risperiDONE   0.5 mg Oral BID   senna-docusate  1 tablet Oral BID   sodium chloride  flush  3 mL Intravenous Q12H   Continuous  Infusions:   LOS: 5 days    Time spent: 45 minutes spent on chart review, discussion with nursing staff, consultants, updating family and interview/physical exam; more than 50% of that time was spent in counseling and/or coordination of care.    Enrigue Harvard, DO [facility] Available via Epic secure chat 7am-7pm After these hours, please refer to coverage provider listed on [email or web address] 11/07/2023, 10:56 AM

## 2023-11-07 NOTE — Progress Notes (Signed)
 Mobility Specialist - Progress Note   11/07/23 1034  Mobility  Activity Ambulated with assistance to bathroom  Level of Assistance Standby assist, set-up cues, supervision of patient - no hands on  Assistive Device Front wheel walker  Distance Ambulated (ft) 20 ft  Activity Response Tolerated well  Mobility Referral Yes  Mobility visit 1 Mobility  Mobility Specialist Start Time (ACUTE ONLY) 1029  Mobility Specialist Stop Time (ACUTE ONLY) 1033  Mobility Specialist Time Calculation (min) (ACUTE ONLY) 4 min   Pt received in bed declining mobility but requesting assistance to the bathroom. Pt to bathroom after session with all needs met.    [facility]

## 2023-11-08 DIAGNOSIS — E871 Hypo-osmolality and hyponatremia: Secondary | ICD-10-CM | POA: Diagnosis not present

## 2023-11-08 LAB — BASIC METABOLIC PANEL WITH GFR
Anion gap: 9 (ref 5–15)
BUN: 15 mg/dL (ref 6–20)
CO2: 22 mmol/L (ref 22–32)
Calcium: 8.8 mg/dL — ABNORMAL LOW (ref 8.9–10.3)
Chloride: 99 mmol/L (ref 98–111)
Creatinine, Ser: 0.52 mg/dL (ref 0.44–1.00)
GFR, Estimated: >60 mL/min (ref >60)
Glucose, Bld: 82 mg/dL (ref 70–99)
Potassium: 3.6 mmol/L (ref 3.5–5.1)
Sodium: 130 mmol/L — ABNORMAL LOW (ref 135–145)

## 2023-11-08 MED ORDER — HALOPERIDOL LACTATE 5 MG/ML IJ SOLN
5.0000 mg | Freq: Three times a day (TID) | INTRAMUSCULAR | Status: DC | PRN
  Filled 2023-11-08: qty 1

## 2023-11-08 MED ORDER — HYDROCORTISONE 5 MG PO TABS
7.5000 mg | ORAL_TABLET | Freq: Every morning | ORAL | Status: DC
  Administered 2023-11-10: 7.5 mg via ORAL
  Filled 2023-11-08 (×2): qty 2

## 2023-11-08 MED ORDER — HALOPERIDOL 2 MG PO TABS
2.0000 mg | ORAL_TABLET | Freq: Three times a day (TID) | ORAL | Status: DC | PRN
  Administered 2023-11-08: 2 mg via ORAL
  Filled 2023-11-08: qty 4

## 2023-11-08 MED ORDER — HYDROCORTISONE 5 MG PO TABS
2.5000 mg | ORAL_TABLET | Freq: Every day | ORAL | Status: DC
  Administered 2023-11-10: 2.5 mg via ORAL
  Filled 2023-11-08 (×2): qty 1

## 2023-11-08 MED ORDER — DIPHENHYDRAMINE HCL 50 MG/ML IJ SOLN
50.0000 mg | Freq: Once | INTRAMUSCULAR | Status: AC
Start: 2023-11-08 — End: 2023-11-08
  Administered 2023-11-08: 50 mg via INTRAMUSCULAR
  Filled 2023-11-08: qty 1

## 2023-11-08 MED ORDER — LORAZEPAM 2 MG/ML IJ SOLN
2.0000 mg | Freq: Two times a day (BID) | INTRAMUSCULAR | Status: DC | PRN
  Administered 2023-11-08 – 2023-11-10 (×2): 2 mg via INTRAMUSCULAR
  Filled 2023-11-08 (×2): qty 1

## 2023-11-08 MED ORDER — RISPERIDONE 1 MG PO TABS
1.0000 mg | ORAL_TABLET | Freq: Two times a day (BID) | ORAL | Status: DC
  Administered 2023-11-08 – 2023-11-10 (×3): 1 mg via ORAL
  Filled 2023-11-08 (×6): qty 1

## 2023-11-08 MED ORDER — DIPHENHYDRAMINE HCL 50 MG/ML IJ SOLN: 50.0000 mg | Freq: Once | INTRAMUSCULAR | Status: DC

## 2023-11-08 MED ORDER — HALOPERIDOL 5 MG PO TABS: 5.0000 mg | ORAL_TABLET | Freq: Four times a day (QID) | ORAL | Status: DC | PRN

## 2023-11-08 MED ORDER — HALOPERIDOL LACTATE 5 MG/ML IJ SOLN: 5.0000 mg | Freq: Four times a day (QID) | INTRAMUSCULAR | Status: DC | PRN

## 2023-11-08 NOTE — Plan of Care (Signed)
  Problem: Clinical Measurements: Goal: Diagnostic test results will improve Outcome: Progressing Goal: Respiratory complications will improve Outcome: Progressing Goal: Cardiovascular complication will be avoided Outcome: Progressing   Problem: Activity: Goal: Risk for activity intolerance will decrease Outcome: Progressing   Problem: Skin Integrity: Goal: Risk for impaired skin integrity will decrease Outcome: Progressing

## 2023-11-08 NOTE — Progress Notes (Signed)
 I met with Kaitlyn Duncan to provide emotional and spiritual support.  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 She spoke about her mother who shared my name as well as her little girl whom she named Judeen Nose as well after her mother. I provided prayer, shared reading of Bible verses that are meaningful to her and song. Please page if additional needs arise or if [facility] requests.  [phone].

## 2023-11-08 NOTE — Progress Notes (Signed)
 Pts bed alarm going off, when entering pts room pt started yelling at staff telling us  to leave her alone. Pt pulled out her IV, unknown as to what time since it was laying in her bed. Pt not bleeding at site. DO Vann notified. When asked if access was needed Vann and this Nurse agreed to wait for a time when pt is more cooperative.

## 2023-11-08 NOTE — Progress Notes (Signed)
 PROGRESS NOTE    Kaitlyn Duncan  WUJ:[id number] DOB: 07-17-1970 DOA: 11/02/2023 PCP: Adelia Homestead, MD    Brief Narrative:  Kaitlyn Duncan is a 53 y.o. female with medical history significant of schizophrenia, GERD, anemia of chronic disease, chronic thrombocytopenia, history of strokes, and history of malignant melanoma with mets to the brain status postchemotherapy.  Patient sent to the ED via EMS after having 2 falls in 1 week.  According to facility staff she has not taken her medications in 6 months.  Found to have hyponatremia and was also found to have adrenal insufficiency.  Patient was compliant over the weekend but changed on Monday and worsened on Tuesday with refusal of medications and examinations.  Have reinvolved psychiatry for medication management.  Assessment and Plan: Acute symptomatic hyponatremia- suspect SIADH -sodium in late 2024 was 137 and then in February was 129 Cortisol low s/p stim test--A peak cortisol level of less than 18 ?g/dL at 30 or 60 minutes post-cosyntropin  administration is indicative of adrenal insufficiency: 11 at 30 minutes: 13.9 at 60 minutes - ACTH low indicating a secondary adrenal insufficiency.  -started hydrocortisone  and will monitor symptoms   Frequent falls Suspect secondary to volume depletion and possible orthostasis as well as hyponatremia Patient denied tripping prior to her fall but was unable to recall history of whether she became dizzy or weak prior to fall Physical therapy evaluation-- SNF placement-TOC consulted and continue to await note   Known malignant melanoma with brain metastasis status post chemotherapy -Stage IV melanoma diagnosed in July 2019.   - patient has not been following up consistently and although she did follow-up with the oncology team in February 2025 she did not keep her appointment for MRI brain and PET scan. -Dr. Marguerita Shih added to treatment team -MRI done in the hospital without acute  issues   Schizophrenia no noncompliance with medical therapy/has legal guardian -difficult situation.   -Patient has not been taking her psychiatric medications for at least 6 months which has further worsened her underlying psychiatric condition Resume Risperdal  - Defer medication changes to psychiatry  Chronic thrombocytopenia - Stable       DVT prophylaxis: SCDs Start: 11/02/23 1429    Code Status: Full Code Called legal guardian with no answer and no ability to leave message  Disposition Plan:  Level of care: Med-Surg Status is: Inpatient     Consultants:  Psych   Subjective: Patient irritable and uncooperative, "get out of here you witch"  Objective: Vitals:   11/06/23 0618 11/06/23 1455 11/06/23 2123 11/07/23 0500  BP: 104/78 107/69 111/78 120/84  Pulse: 72 84 68 69  Resp: 14 12 16 16   Temp: 97.6 F (36.4 C) 98.5 F (36.9 C) 98 F (36.7 C) 97.8 F (36.6 C)  TempSrc: Oral Oral Oral Oral  SpO2: 99% 97% 99% 98%  Weight:      Height:        Intake/Output Summary (Last 24 hours) at 11/08/2023 1027 Last data filed at 11/07/2023 1300 Gross per 24 hour  Intake 240 ml  Output --  Net 240 ml    Filed Weights   11/02/23 1029  Weight: 50 kg    Examination:  Refusing evaluation     Data Reviewed: I have personally reviewed following labs and imaging studies  CBC: Recent Labs  Lab 11/03/23 0520 11/04/23 0541 11/05/23 0941 11/06/23 0709 11/07/23 0523  WBC 2.8* 2.7* 3.2* 3.4* 3.4*  HGB 12.1 10.6* 11.2* 11.0* 10.3*  HCT 35.8*  33.2* 33.9* 34.7* 32.6*  MCV 80.3 84.5 81.5 85.7 85.6  PLT 76* 72* 73* 79* 79*   Basic Metabolic Panel: Recent Labs  Lab 11/04/23 0541 11/05/23 0941 11/06/23 0709 11/07/23 0523 11/08/23 0620  NA 129* 130* 134* 132* 130*  K 3.5 3.5 3.8 3.7 3.6  CL 100 99 103 100 99  CO2 24 21* 24 24 22   GLUCOSE 80 97 82 81 82  BUN 16 12 15 12 15   CREATININE 0.53 0.49 0.58 0.43* 0.52  CALCIUM  8.4* 8.9 9.2 8.6* 8.8*    GFR: Estimated Creatinine Clearance: 57.1 mL/min (by C-G formula based on SCr of 0.52 mg/dL). Liver Function Tests: Recent Labs  Lab 11/03/23 0520  AST 48*  ALT 23  ALKPHOS 79  BILITOT 1.0  PROT 7.1  ALBUMIN 4.0   No results for input(s): "LIPASE", "AMYLASE" in the last 168 hours. No results for input(s): "AMMONIA" in the last 168 hours. Coagulation Profile: No results for input(s): "INR", "PROTIME" in the last 168 hours. Cardiac Enzymes: No results for input(s): "CKTOTAL", "CKMB", "CKMBINDEX", "TROPONINI" in the last 168 hours. BNP (last 3 results) No results for input(s): "PROBNP" in the last 8760 hours. HbA1C: No results for input(s): "HGBA1C" in the last 72 hours. CBG: No results for input(s): "GLUCAP" in the last 168 hours. Lipid Profile: No results for input(s): "CHOL", "HDL", "LDLCALC", "TRIG", "CHOLHDL", "LDLDIRECT" in the last 72 hours. Thyroid  Function Tests: No results for input(s): "TSH", "T4TOTAL", "FREET4", "T3FREE", "THYROIDAB" in the last 72 hours.  Anemia Panel: No results for input(s): "VITAMINB12", "FOLATE", "FERRITIN", "TIBC", "IRON", "RETICCTPCT" in the last 72 hours. Sepsis Labs: No results for input(s): "PROCALCITON", "LATICACIDVEN" in the last 168 hours.  No results found for this or any previous visit (from the past 240 hours).       Radiology Studies: No results found.       Scheduled Meds:  hydrocortisone   10 mg Oral q AM   hydrocortisone   5 mg Oral Daily   risperiDONE   1 mg Oral BID   senna-docusate  1 tablet Oral BID   sodium chloride  flush  3 mL Intravenous Q12H   Continuous Infusions:   LOS: 6 days    Time spent: 45 minutes spent on chart review, discussion with nursing staff, consultants, updating family and interview/physical exam; more than 50% of that time was spent in counseling and/or coordination of care.    Enrigue Harvard, DO [facility] Available via Epic secure chat 7am-7pm After these hours,  please refer to coverage provider listed on [email or web address] 11/08/2023, 10:27 AM

## 2023-11-08 NOTE — TOC Progression Note (Signed)
 Transition of Care [facility]) - Progression Note    Patient Details  Name: Kaitlyn Duncan MRN: [id number] Date of Birth: 24-Nov-1970  [facility] CM/SW Contact  Loreda Rodriguez, RN Phone [phone]  11/08/2023, 5:10 PM  Clinical Narrative:    IVC paperwork has been uploaded into efile system. Magistrate has been made aware and will get on it asap. Envelope # [id number].    Expected Discharge Plan: Long Term Acute Care (LTAC) Barriers to Discharge: Continued Medical Work up  Expected Discharge Plan and Services In-house Referral: NA Discharge Planning Services: NA Post Acute Care Choice: Skilled Nursing Facility Living arrangements for the past 2 months: Skilled Nursing Facility                 DME Arranged: N/A DME Agency: NA       HH Arranged: NA HH Agency: NA         Social Determinants of Health (SDOH) Interventions SDOH Screenings   Food Insecurity: Food Insecurity Present (08/04/2022)  Housing: High Risk (04/09/2022)  Transportation Needs: Unmet Transportation Needs (08/04/2022)  Utilities: Not At Risk (08/04/2022)  Alcohol Screen: Low Risk  (06/27/2021)  Depression (PHQ2-9): Low Risk  (07/21/2022)  Tobacco Use: Low Risk  (11/02/2023)    Readmission Risk Interventions    08/04/2022    3:35 PM  Readmission Risk Prevention Plan  Transportation Screening Complete  PCP or Specialist Appt within 3-5 Days Complete  HRI or Home Care Consult Complete  Social Work Consult for Recovery Care Planning/Counseling Complete  Palliative Care Screening Complete  Medication Review Oceanographer) Complete

## 2023-11-08 NOTE — Consult Note (Signed)
 [facility] Follow-[facility]  Patient Name: .Kaitlyn Duncan  MRN: [id number]  DOB: [birth year]  Consult Order details:  Orders (From admission, onward)     Start     Ordered   11/03/23 0811  IP CONSULT TO PSYCHIATRY       Ordering Provider: Enrigue Harvard, DO  Provider:  (Not yet assigned)  Question Answer Comment  Location [facility]   Reason for Consult? medication management/optimization (has been off meds for around 6 month)      11/03/23 0813             Mode of Visit: In person    Psychiatry Consult Evaluation  Service Date: Nov 08, 2023 LOS:  LOS: 6 days  Chief Complaint "I feel fine"  Primary Psychiatric Diagnoses  Schizophrenia, undifferentiated   Assessment  Kaitlyn Duncan is a 53 y.o. female admitted: Medicallyfor 11/02/2023 10:19 AM for hyponatremia. She carries the psychiatric diagnoses of schizophrenia and has a past medical history of GERD, anemia of chronic disease, chronic thrombocytopenia, history of strokes, and history of malignant melanoma with mets to the brain status postchemotherapy.   The patient was seen and assessed at bedside for non-compliance with psychiatric medications over the past six months and longstanding delusional disorder characterized by fixed beliefs denying her diagnosis of schizophrenia. She was admitted medically for severe hyponatremia and recurrent falls. Collateral information confirms she has been off antipsychotic medications for more than six months while residing at a nursing facility, consistently denying any psychiatric illness. Attempts to engage her in discussion regarding her psychiatric history or medications were met with irritability and irrational responses.  The patient carries a psychiatric history of schizophrenia and a medical history significant for melanoma with brain metastases (status post-chemotherapy), anemia of chronic disease, thrombocytopenia, GERD, and prior  strokes. At this time, the patient remains delirious and is refusing to engage with the provider, consistent with her prior behavioral patterns. While she continues to display fixed delusional thinking, outside of these beliefs she is not currently presenting with acute psychosis.  Addendum: The provider was contacted after the patient ripped out her IV line and attempted to leave the unit. Although a legal guardian is in place, the decision was made to initiate involuntary commitment (IVC) as the patient was nearing the elevator but subsequently became somewhat uncooperative. See nursing documentation. Given her complex medical history--including adrenal insufficiency, malignant melanoma with brain metastases, hyponatremia, and schizophrenia--multiple factors are likely contributing to her current presentation. Psychiatry will remain signed on for ongoing symptom management.  Diagnoses:  Active Hospital problems: Principal Problem:   Acute hyponatremia Active Problems:   Hyponatremia    Plan   ## Psychiatric Medication Recommendations Continue to manage medically with attention to correcting hyponatremia.  Engage her legal guardian for ongoing treatment decisions.  If ethical concerns escalate (e.g., ongoing medical deterioration with treatment refusal), recommend consulting the hospital ethics committee.  If appropriate, trial restarting psychiatric medications only after stabilization of medical issues, ensuring full disclosure to the patient to minimize suspicion and paranoia.   Risk vs. Benefit Analysis: Initiating psychotropic medications against her will at this time would not offer immediate benefit and may worsen her medical condition, particularly her hyponatremia.  Certain medications (e.g., SSRIs, antiepileptics, quetiapine , aripiprazole, clozapine, haloperidol ) are known to exacerbate hyponatremia and are not recommended until the underlying cause of electrolyte disturbance is  fully identified and corrected.   -PRN agitation orders placed for IM medications. Patient pulled IV. May  need to consider force meds if patient does not respond to normal therapeutic interventions.  ## Medical Decision Making Capacity: Not specifically addressed in this encounter  ## Further Work-up:  -- most recent EKG on 5/1 had QtC of 464. WIll repeat once cooperative.  -- Pertinent labwork reviewed earlier this admission includes: CBC, Chem panel, urine and EKG   ## Disposition:-- There are no psychiatric contraindications to discharge at this time  ## Behavioral / Environmental: - No specific recommendations at this time.  -IVC to be completed by primary team.   ## Safety and Observation Level:  - Based on my clinical evaluation, I estimate the patient to be at low risk of self harm in the current setting. - At this time, we recommend  routine. This decision is based on my review of the chart including patient's history and current presentation, interview of the patient, mental status examination, and consideration of suicide risk including evaluating suicidal ideation, plan, intent, suicidal or self-harm behaviors, risk factors, and protective factors. This judgment is based on our ability to directly address suicide risk, implement suicide prevention strategies, and develop a safety plan while the patient is in the clinical setting. Please contact our team if there is a concern that risk level has changed.  CSSR Risk Category:C-SSRS RISK CATEGORY: No Risk  Suicide Risk Assessment: Patient has following modifiable risk factors for suicide: medication noncompliance, which we are addressing by monitoring her symptoms. Patient has following non-modifiable or demographic risk factors for suicide: psychiatric hospitalization Patient has the following protective factors against suicide: Supportive family  Thank you for this consult request. Recommendations have been communicated to the  primary team.  We continue to follow at this time.   Rella Cardinal, FNP       History of Present Illness  Relevant Aspects of [facility] Course:  Admitted on 11/02/2023 for hyponatremia. They were consulted for medication management which she does not want.  Patient Report:  Patient very grumpy and refusing to speak. Remains delusional.   Psych ROS:  Depression: denied Anxiety:  denied Mania (lifetime and current): past Psychosis: (lifetime and current): past   Review of Systems  All other systems reviewed and are negative.    Psychiatric and Social History  Psychiatric History:  Information collected from patient and chart.  Prev Dx/Sx: schizophrenia, undifferentiated Current Psych Provider: ACT team noted Home Meds (current): Prozac, Risperdal ,Zyprexa  Previous Med Trials: Prolixin  Therapy: none  Prior Psych Hospitalization: multiple  Prior Self Harm: none Prior Violence: none  Family Psych History: unknown Family Hx suicide: none  Social History:  Living Situation: nursing home  Access to weapons/lethal means: none   Substance History No substance use or history  Exam Findings  Physical Exam:  Vital Signs:    Blood pressure 120/84, pulse 69, temperature 97.8 F (36.6 C), temperature source Oral, resp. rate 16, height 5' (1.524 m), weight 50 kg, SpO2 98%. Body mass index is 21.53 kg/m.  Physical Exam Vitals and nursing note reviewed.  Constitutional:      Appearance: Normal appearance.  Neurological:     Mental Status: She is alert.  Psychiatric:        Attention and Perception: She is inattentive.        Mood and Affect: Mood is anxious. Affect is blunt and angry.        Speech: Speech normal.        Behavior: Behavior is uncooperative.        Thought Content: Thought  content is delusional.        Cognition and Memory: Cognition is impaired. Memory is impaired. She exhibits impaired recent memory.        Judgment: Judgment is  impulsive and inappropriate.     Mental Status Exam: Deferred patient uncooperative   Other History   These have been pulled in through the EMR, reviewed, and updated if appropriate.  Family History:  The patient's family history includes Breast cancer in her maternal aunt, maternal grandmother, paternal aunt, and paternal grandmother; Diabetes in her paternal aunt and paternal grandmother; Hypertension in her father; Melanoma in her mother.  Medical History: Past Medical History:  Diagnosis Date   Allergy    Anemia    Gallstones    GERD (gastroesophageal reflux disease)    Hemorrhoids    melanoma with met dz dx'd 01/2018   brain and adrenal   Seasonal allergies    Stroke [facility])     Surgical History: Past Surgical History:  Procedure Laterality Date   BIOPSY  01/28/2018   Procedure: BIOPSY;  Surgeon: Jolinda Necessary, MD;  Location: [facility];  Service: Endoscopy;;   BREAST BIOPSY Left    ESOPHAGOGASTRODUODENOSCOPY N/A 01/28/2018   Procedure: ESOPHAGOGASTRODUODENOSCOPY (EGD);  Surgeon: Jolinda Necessary, MD;  Location: [facility];  Service: Endoscopy;  Laterality: N/A;   MOUTH SURGERY     OVARY SURGERY     removed "something"     Medications:   Current Facility-Administered Medications:    acetaminophen  (TYLENOL ) tablet 650 mg, 650 mg, Oral, Q6H PRN **OR** acetaminophen  (TYLENOL ) suppository 650 mg, 650 mg, Rectal, Q6H PRN, Lovell Rubenstein, NP   diphenhydrAMINE  (BENADRYL ) injection 50 mg, 50 mg, Intravenous, Once, Starkes-Perry, Allana Ishikawa, FNP   haloperidol  (HALDOL ) tablet 5 mg, 5 mg, Oral, Q6H PRN **OR** haloperidol  lactate (HALDOL ) injection 5 mg, 5 mg, Intramuscular, Q6H PRN, Vann, Jessica U, DO   [START ON 11/09/2023] hydrocortisone  (CORTEF ) tablet 2.5 mg, 2.5 mg, Oral, Daily, Vann, Jessica U, DO   [START ON 11/09/2023] hydrocortisone  (CORTEF ) tablet 7.5 mg, 7.5 mg, Oral, q AM, Vann, Jessica U, DO   lip balm (CARMEX) ointment, , Topical, PRN, Vann, Jessica U, DO    ondansetron  (ZOFRAN ) tablet 4 mg, 4 mg, Oral, Q6H PRN **OR** ondansetron  (ZOFRAN ) injection 4 mg, 4 mg, Intravenous, Q6H PRN, Lovell Rubenstein, NP   risperiDONE  (RISPERDAL ) tablet 1 mg, 1 mg, Oral, BID, Starkes-Perry, Shelita Steptoe S, FNP, 1 mg at 11/08/23 1055   senna-docusate (Senokot-S) tablet 1 tablet, 1 tablet, Oral, BID, Vann, Jessica U, DO, 1 tablet at 11/08/23 1055   sodium chloride  flush (NS) 0.9 % injection 3 mL, 3 mL, Intravenous, Q12H, Lovell Rubenstein, NP, 3 mL at 11/07/23 2116  Allergies: No Known Allergies  Rella Cardinal, FNP

## 2023-11-08 NOTE — TOC Initial Note (Signed)
 Transition of Care [facility]) - Initial/Assessment Note    Patient Details  Name: Kaitlyn Duncan MRN: [id number] Date of Birth: 1970-12-04  Transition of Care [facility]) CM/SW Contact:    Loreda Rodriguez, RN Phone Number:[phone]  11/08/2023, 11:38 AM  Clinical Narrative:                 [facility] acknowledges consult for patient admitted from any facility. Patient is from [facility]CM has confirmed that patient is long term at [facility] and can return as long as a bed is available at time of discharge. Patient currently is not medically stable for discharge  Expected Discharge Plan: Long Term Acute Care (LTAC) Barriers to Discharge: Continued Medical Work up   Patient Goals and CMS Choice Patient states their goals for this hospitalization and ongoing recovery are:: patient not willing to participate          Expected Discharge Plan and Services In-house Referral: NA Discharge Planning Services: NA Post Acute Care Choice: Skilled Nursing Facility Living arrangements for the past 2 months: Skilled Nursing Facility                 DME Arranged: N/A DME Agency: NA       HH Arranged: NA HH Agency: NA        Prior Living Arrangements/Services Living arrangements for the past 2 months: Skilled Nursing Facility Lives with:: Roommate Patient language and need for interpreter reviewed:: Yes        Need for Family Participation in Patient Care: Yes (Comment) Care giver support system in place?: No (comment) Current home services:  (n/a) Criminal Activity/Legal Involvement Pertinent to Current Situation/Hospitalization: No - Comment as needed  Activities of Daily Living   ADL Screening (condition at time of admission) Independently performs ADLs?: No Does the patient have a NEW difficulty with bathing/dressing/toileting/self-feeding that is expected to last >3 days?: No Does the patient have a NEW difficulty with getting in/out of bed, walking, or climbing stairs that is  expected to last >3 days?: No Does the patient have a NEW difficulty with communication that is expected to last >3 days?: No Is the patient deaf or have difficulty hearing?: No Does the patient have difficulty seeing, even when wearing glasses/contacts?: No Does the patient have difficulty concentrating, remembering, or making decisions?: No  Permission Sought/Granted                  Emotional Assessment Appearance:: Appears stated age Attitude/Demeanor/Rapport: Paranoid, Reactive, Suspicious Affect (typically observed): Anxious, Agitated Orientation: : Oriented to Self, Oriented to Place Alcohol / Substance Use: Not Applicable Psych Involvement: Yes (comment)  Admission diagnosis:  Dehydration [E86.0] Acute hyponatremia [E87.1] Hypokalemia [E87.6] Hyponatremia [E87.1] Thrombocytopenia (HCC) [D69.6] Generalized weakness [R53.1] Frequent falls [R29.6] Patient Active Problem List   Diagnosis Date Noted   Hyponatremia 11/03/2023   Acute hyponatremia 11/02/2023   UTI (urinary tract infection) 08/04/2022   Falls 08/03/2022   Constipation 07/23/2022   Involuntary commitment 06/07/2022   Frequent falls 04/09/2022   Allergic rhinitis 04/09/2022   Hx of completed stroke 04/08/2022   Prediabetes 12/24/2021   Frequent urination 12/24/2021   Undifferentiated schizophrenia (HCC) 09/25/2021   Leukopenia 06/27/2021   Thrombocytopenia (HCC) 06/27/2021   Schizophrenia spectrum disorder with psychotic disorder type not yet determined (HCC) 06/26/2021   Has difficulty accessing primary care provider for most visits 06/15/2021   Candidiasis of skin 01/23/2021   Musculoskeletal pain 01/23/2021   Psychosis (HCC)    Melanoma of skin (  HCC) 05/29/2018   Goals of care, counseling/discussion 05/29/2018   Malignant neoplasm metastatic to brain [facility]) 01/26/2018   PCP:  Adelia Homestead, MD Pharmacy:   [facility] - [place], [place] - [address] [place] [address] Phone: [phone] Fax: [phone]     Social Drivers of Health (SDOH) Social History: SDOH Screenings   Food Insecurity: Food Insecurity Present (08/04/2022)  Housing: High Risk (04/09/2022)  Transportation Needs: Unmet Transportation Needs (08/04/2022)  Utilities: Not At Risk (08/04/2022)  Alcohol Screen: Low Risk  (06/27/2021)  Depression (PHQ2-9): Low Risk  (07/21/2022)  Tobacco Use: Low Risk  (11/02/2023)   SDOH Interventions:     Readmission Risk Interventions    08/04/2022    3:35 PM  Readmission Risk Prevention Plan  Transportation Screening Complete  PCP or Specialist Appt within 3-5 Days Complete  HRI or Home Care Consult Complete  Social Work Consult for Recovery Care Planning/Counseling Complete  Palliative Care Screening Complete  Medication Review Oceanographer) Complete

## 2023-11-08 NOTE — Progress Notes (Signed)
   11/08/23 1457  Spiritual Encounters  Type of Visit Initial  Reason for visit Routine spiritual support  OnCall Visit No   Visited with patient who was calling from room for assistance. Patient request another gown. Patient walking around the room and then headed towards the door. When asked where she was headed, patient stated she is leaving the hospital and that she was discharged. When asked about clothing, patient reported she was leaving in the gown. Patient had two days of meals on her table that she did not eat. Patient yelled out. "Its time to go, everyone must go, come on.", advised the nursing team who brought patient another gown. Advised patient not to leave as the nurse was on their way, patient headed back to seat to wait for the nurse.Aaron Aas

## 2023-11-09 DIAGNOSIS — E871 Hypo-osmolality and hyponatremia: Secondary | ICD-10-CM | POA: Diagnosis not present

## 2023-11-09 LAB — CBC
HCT: 34.9 % — ABNORMAL LOW (ref 36.0–46.0)
Hemoglobin: 11.1 g/dL — ABNORMAL LOW (ref 12.0–15.0)
MCH: 26.6 pg (ref 26.0–34.0)
MCHC: 31.8 g/dL (ref 30.0–36.0)
MCV: 83.7 fL (ref 80.0–100.0)
Platelets: 90 10*3/uL — ABNORMAL LOW (ref 150–400)
RBC: 4.17 MIL/uL (ref 3.87–5.11)
RDW: 13.2 % (ref 11.5–15.5)
WBC: 3.4 10*3/uL — ABNORMAL LOW (ref 4.0–10.5)
nRBC: 0 % (ref 0.0–0.2)

## 2023-11-09 LAB — BASIC METABOLIC PANEL WITH GFR
Anion gap: 9 (ref 5–15)
BUN: 24 mg/dL — ABNORMAL HIGH (ref 6–20)
CO2: 24 mmol/L (ref 22–32)
Calcium: 9.2 mg/dL (ref 8.9–10.3)
Chloride: 99 mmol/L (ref 98–111)
Creatinine, Ser: 0.72 mg/dL (ref 0.44–1.00)
GFR, Estimated: >60 mL/min (ref >60)
Glucose, Bld: 85 mg/dL (ref 70–99)
Potassium: 3.5 mmol/L (ref 3.5–5.1)
Sodium: 132 mmol/L — ABNORMAL LOW (ref 135–145)

## 2023-11-09 MED ORDER — ENSURE ENLIVE PO LIQD
237.0000 mL | Freq: Two times a day (BID) | ORAL | Status: DC
  Administered 2023-11-10 (×2): 237 mL via ORAL

## 2023-11-09 NOTE — Progress Notes (Signed)
 Pt was agitated during shift accusing staff of stealing her money and formulating fraudulent practices. Pt accused staff of being "witches" and "evil". Writer was able to convince pt to take her HS po medication however, post that she became extremely agitated with the sitter yelling and screaming and refused to get back into the bed. She was extremely impulsive, getting up out the bed, not wanting assistance and her gait is unsteady. PRN Ativan  was administered and was effective. Pt is on delirium precautions and appears to be resting at this time. Labs were deferred to later in the morning.

## 2023-11-09 NOTE — TOC Progression Note (Signed)
 Transition of Care [facility]) - Progression Note    Patient Details  Name: Demariyah Pizzo MRN: [id number] Date of Birth: 1970/11/09  [facility] CM/SW Contact  Loreda Rodriguez, RN Phone [phone]  11/09/2023, 10:48 AM  Clinical Narrative:    IVC paperwork has been filed File Number [id number]   Expected Discharge Plan: Long Term Acute Care (LTAC) Barriers to Discharge: Continued Medical Work up  Expected Discharge Plan and Services In-house Referral: NA Discharge Planning Services: NA Post Acute Care Choice: Skilled Nursing Facility Living arrangements for the past 2 months: Skilled Nursing Facility                 DME Arranged: N/A DME Agency: NA       HH Arranged: NA HH Agency: NA         Social Determinants of Health (SDOH) Interventions SDOH Screenings   Food Insecurity: Food Insecurity Present (08/04/2022)  Housing: High Risk (04/09/2022)  Transportation Needs: Unmet Transportation Needs (08/04/2022)  Utilities: Not At Risk (08/04/2022)  Alcohol Screen: Low Risk  (06/27/2021)  Depression (PHQ2-9): Low Risk  (07/21/2022)  Tobacco Use: Low Risk  (11/02/2023)    Readmission Risk Interventions    08/04/2022    3:35 PM  Readmission Risk Prevention Plan  Transportation Screening Complete  PCP or Specialist Appt within 3-5 Days Complete  HRI or Home Care Consult Complete  Social Work Consult for Recovery Care Planning/Counseling Complete  Palliative Care Screening Complete  Medication Review Oceanographer) Complete

## 2023-11-09 NOTE — Progress Notes (Signed)
 PT Cancellation Note  Patient Details Name: Berenda Garry MRN: [id number] DOB: 1970-10-13   Cancelled Treatment:    Reason Eval/Treat Not Completed: Fatigue/lethargy limiting ability to participate (pt sleeping soundly, she did not arouse to verbal/tactile stimulii. Will follow.)   Daymon Evans PT 11/09/2023  Acute Rehabilitation Services  Office [phone]

## 2023-11-09 NOTE — Progress Notes (Signed)
 [facility] Progress Note  Patient: Kaitlyn Duncan     ZOX:[id number]  DOA: 11/02/2023   PCP: Adelia Homestead, MD       Brief hospital course: This is a 53 year old female with schizophrenia, chronic thrombocytopenia, history of CVAs, malignant melanoma with mets to the brain status post chemotherapy who presents to the hospital for falls. She has had 2 falls in 1 week while at [facility] where the patient resides for long-term care.  In the hospital she was found to have a sodium of 120, potassium 3.4 and was therefore hospitalized.  Subjective:  Patient has no complaints and is quite sleepy.  Does drink water  when I offered to her but otherwise does not answer questions.  Assessment and Plan: Principal Problem:   Acute hyponatremia - Sodium has steadily improved to 132 - Low ACTH-4.3-suspected to have adrenal insufficiency and therefore started on Solu-Cortef  on 5/7  Active problems:   Schizophrenia spectrum disorder with psychotic disorder type not yet determined (HCC) -Appreciate management by psychiatry - Continue sitter at the bedside  Frequent falls - PT is recommended skilled nursing facility - Waiting on this  History of malignant melanoma with brain metastasis - Has undergone treatment and is on surveillance  Pancytopenia - Stable    Code Status: Full Code Total time on patient care: 20 DVT prophylaxis:  SCDs Start: 11/02/23 1429     Objective:   Vitals:   11/06/23 2123 11/07/23 0500 11/08/23 2224 11/09/23 1336  BP: 111/78 120/84 113/87 105/63  Pulse: 68 69 91 74  Resp: 16 16 20 16   Temp:  97.8 F (36.6 C) 98.3 F (36.8 C) 98.3 F (36.8 C)  TempSrc: Oral Oral Oral Oral  SpO2: 99% 98% 98% 97%  Weight:      Height:       Filed Weights   11/02/23 1029  Weight: 50 kg   Exam: General exam: Appears comfortable  HEENT: oral mucosa moist Respiratory system: Clear to auscultation.  Cardiovascular system: S1 & S2 heard   Gastrointestinal system: Abdomen soft, non-tender, nondistended. Normal bowel sounds   Extremities: No cyanosis, clubbing or edema    CBC: Recent Labs  Lab 11/04/23 0541 11/05/23 0941 11/06/23 0709 11/07/23 0523 11/09/23 1101  WBC 2.7* 3.2* 3.4* 3.4* 3.4*  HGB 10.6* 11.2* 11.0* 10.3* 11.1*  HCT 33.2* 33.9* 34.7* 32.6* 34.9*  MCV 84.5 81.5 85.7 85.6 83.7  PLT 72* 73* 79* 79* 90*   Basic Metabolic Panel: Recent Labs  Lab 11/05/23 0941 11/06/23 0709 11/07/23 0523 11/08/23 0620 11/09/23 1101  NA 130* 134* 132* 130* 132*  K 3.5 3.8 3.7 3.6 3.5  CL 99 103 100 99 99  CO2 21* 24 24 22 24   GLUCOSE 97 82 81 82 85  BUN 12 15 12 15  24*  CREATININE 0.49 0.58 0.43* 0.52 0.72  CALCIUM  8.9 9.2 8.6* 8.8* 9.2     Scheduled Meds:  feeding supplement  237 mL Oral BID BM   hydrocortisone   2.5 mg Oral Daily   hydrocortisone   7.5 mg Oral q AM   risperiDONE   1 mg Oral BID   senna-docusate  1 tablet Oral BID   sodium chloride  flush  3 mL Intravenous Q12H    Imaging and lab data personally reviewed   Author: Mithra Spano  11/09/2023 5:34 PM  To contact [facility]>   Check the care team in [facility] and look for the attending/consulting [facility] provider listed  Log into [email or web address] and use [facility]  universal password   Go to> "[facility]"  and find provider  If you still have difficulty reaching the provider, please page the [facility] (Director on Call) for the Hospitalists listed on amion

## 2023-11-09 NOTE — Plan of Care (Signed)
   Problem: Elimination: Goal: Will not experience complications related to bowel motility Outcome: Progressing   Problem: Pain Managment: Goal: General experience of comfort will improve and/or be controlled Outcome: Progressing   Problem: Safety: Goal: Ability to remain free from injury will improve Outcome: Progressing

## 2023-11-09 NOTE — Consult Note (Signed)
 [facility] Consult Follow-up  Patient Name: .Kaitlyn Duncan  MRN: [id number]  DOB: [birth year]  Consult Order details:  Orders (From admission, onward)     Start     Ordered   11/03/23 0811  IP CONSULT TO PSYCHIATRY       Ordering Provider: Enrigue Harvard, DO  Provider:  (Not yet assigned)  Question Answer Comment  Location [facility]   Reason for Consult? medication management/optimization (has been off meds for around 6 month)      11/03/23 0813             Mode of Visit: In person    Psychiatry Consult Evaluation  Service Date: Nov 09, 2023 LOS:  LOS: 7 days  Chief Complaint "I feel fine"  Primary Psychiatric Diagnoses  Schizophrenia, undifferentiated   Assessment  Kaitlyn Duncan is a 53 y.o. female admitted: Medicallyfor 11/02/2023 10:19 AM for hyponatremia. She carries the psychiatric diagnoses of schizophrenia and has a past medical history of GERD, anemia of chronic disease, chronic thrombocytopenia, history of strokes, and history of malignant melanoma with mets to the brain status postchemotherapy.   The patient was seen and assessed at bedside for non-compliance with psychiatric medications over the past six months and longstanding delusional disorder characterized by fixed beliefs denying her diagnosis of schizophrenia. She was admitted medically for severe hyponatremia and recurrent falls. Collateral information confirms she has been off antipsychotic medications for more than six months while residing at a nursing facility, consistently denying any psychiatric illness. Attempts to engage her in discussion regarding her psychiatric history or medications were met with irritability and irrational responses.  Attempted to assess patient, unsuccessful at this time. Patient noted to be asleep. Per nursing staff, there appears to be some improvement and response to medications. They have not observed any disruptive behaviors  since last night. Similar report received from safety sitter. No acute concerns at this time.   Diagnoses:  Active Hospital problems: Principal Problem:   Acute hyponatremia Active Problems:   Schizophrenia spectrum disorder with psychotic disorder type not yet determined (HCC)   Hyponatremia    Plan   ## Psychiatric Medication Recommendations Continue to manage medically with attention to correcting hyponatremia.  Engage her legal guardian for ongoing treatment decisions.  If ethical concerns escalate (e.g., ongoing medical deterioration with treatment refusal), recommend consulting the hospital ethics committee.  If appropriate, trial restarting psychiatric medications only after stabilization of medical issues, ensuring full disclosure to the patient to minimize suspicion and paranoia.   Risk vs. Benefit Analysis: Initiating psychotropic medications against her will at this time would not offer immediate benefit and may worsen her medical condition, particularly her hyponatremia.  Certain medications (e.g., SSRIs, antiepileptics, quetiapine , aripiprazole, clozapine, haloperidol ) are known to exacerbate hyponatremia and are not recommended until the underlying cause of electrolyte disturbance is fully identified and corrected.   -PRN agitation orders placed for IM medications. Patient pulled IV. May need to consider force meds if patient does not respond to normal therapeutic interventions.   ## Medical Decision Making Capacity: Not specifically addressed in this encounter  ## Further Work-up:  -- most recent EKG on 5/1 had QtC of 464. WIll repeat once cooperative.  -- Pertinent labwork reviewed earlier this admission includes: CBC, Chem panel, urine and EKG   ## Disposition:-- There are no psychiatric contraindications to discharge at this time  ## Behavioral / Environmental: - No specific recommendations at this time.  -IVC to be completed  by primary team.   ## Safety and  Observation Level:  - Based on my clinical evaluation, I estimate the patient to be at low risk of self harm in the current setting. - At this time, we recommend  routine. This decision is based on my review of the chart including patient's history and current presentation, interview of the patient, mental status examination, and consideration of suicide risk including evaluating suicidal ideation, plan, intent, suicidal or self-harm behaviors, risk factors, and protective factors. This judgment is based on our ability to directly address suicide risk, implement suicide prevention strategies, and develop a safety plan while the patient is in the clinical setting. Please contact our team if there is a concern that risk level has changed.  CSSR Risk Category:C-SSRS RISK CATEGORY: No Risk  Suicide Risk Assessment: Patient has following modifiable risk factors for suicide: medication noncompliance, which we are addressing by monitoring her symptoms. Patient has following non-modifiable or demographic risk factors for suicide: psychiatric hospitalization Patient has the following protective factors against suicide: Supportive family  Thank you for this consult request. Recommendations have been communicated to the primary team.  We will see x1, then sign off.   Rella Cardinal, FNP       History of Present Illness  Relevant Aspects of [facility] Course:  Admitted on 11/02/2023 for hyponatremia. They were consulted for medication management which she does not want.  Patient Report:  Asleep   Psych ROS:  Depression: denied Anxiety:  denied Mania (lifetime and current): past Psychosis: (lifetime and current): past   Review of Systems  All other systems reviewed and are negative.    Psychiatric and Social History  Psychiatric History:  Information collected from patient and chart.  Prev Dx/Sx: schizophrenia, undifferentiated Current Psych Provider: ACT team noted Home Meds  (current): Prozac, Risperdal ,Zyprexa  Previous Med Trials: Prolixin  Therapy: none  Prior Psych Hospitalization: multiple  Prior Self Harm: none Prior Violence: none  Family Psych History: unknown Family Hx suicide: none  Social History:  Living Situation: nursing home  Access to weapons/lethal means: none   Substance History No substance use or history  Exam Findings  Physical Exam:  Vital Signs:  Temp:  [98.3 F (36.8 C)] 98.3 F (36.8 C) (05/06 2224) Pulse Rate:  [91] 91 (05/06 2224) Resp:  [20] 20 (05/06 2224) BP: (113)/(87) 113/87 (05/06 2224) SpO2:  [98 %] 98 % (05/06 2224) Blood pressure 113/87, pulse 91, temperature 98.3 F (36.8 C), temperature source Oral, resp. rate 20, height 5' (1.524 m), weight 50 kg, SpO2 98%. Body mass index is 21.53 kg/m.  Physical Exam Vitals and nursing note reviewed.  Constitutional:      Appearance: Normal appearance.  Neurological:     Mental Status: She is alert.  Psychiatric:        Attention and Perception: She is inattentive.        Mood and Affect: Mood is anxious. Affect is blunt and angry.        Speech: Speech normal.        Behavior: Behavior is uncooperative.        Thought Content: Thought content is delusional.        Cognition and Memory: Cognition is impaired. Memory is impaired. She exhibits impaired recent memory.        Judgment: Judgment is impulsive and inappropriate.     Mental Status Exam: Deferred patient asleep   Other History   These have been pulled in through the EMR, reviewed,  and updated if appropriate.  Family History:  The patient's family history includes Breast cancer in her maternal aunt, maternal grandmother, paternal aunt, and paternal grandmother; Diabetes in her paternal aunt and paternal grandmother; Hypertension in her father; Melanoma in her mother.  Medical History: Past Medical History:  Diagnosis Date   Allergy    Anemia    Gallstones    GERD (gastroesophageal reflux  disease)    Hemorrhoids    melanoma with met dz dx'd 01/2018   brain and adrenal   Seasonal allergies    Stroke [facility])     Surgical History: Past Surgical History:  Procedure Laterality Date   BIOPSY  01/28/2018   Procedure: BIOPSY;  Surgeon: Jolinda Necessary, MD;  Location: [facility] END[facility];  Service: Endoscopy;;   BREAST BIOPSY Left    ESOPHAGOGASTRODUODENOSCOPY N/A 01/28/2018   Procedure: ESOPHAGOGASTRODUODENOSCOPY (EGD);  Surgeon: Jolinda Necessary, MD;  Location: [facility];  Service: Endoscopy;  Laterality: N/A;   MOUTH SURGERY     OVARY SURGERY     removed "something"     Medications:   Current Facility-Administered Medications:    acetaminophen  (TYLENOL ) tablet 650 mg, 650 mg, Oral, Q6H PRN **OR** acetaminophen  (TYLENOL ) suppository 650 mg, 650 mg, Rectal, Q6H PRN, Lovell Rubenstein, NP   feeding supplement (ENSURE ENLIVE / ENSURE PLUS) liquid 237 mL, 237 mL, Oral, BID BM, Rizwan, Saima, MD   haloperidol  (HALDOL ) tablet 5 mg, 5 mg, Oral, Q6H PRN **OR** haloperidol  lactate (HALDOL ) injection 5 mg, 5 mg, Intramuscular, Q6H PRN, Vann, Jessica U, DO   hydrocortisone  (CORTEF ) tablet 2.5 mg, 2.5 mg, Oral, Daily, Vann, Jessica U, DO   hydrocortisone  (CORTEF ) tablet 7.5 mg, 7.5 mg, Oral, q AM, Vann, Jessica U, DO   lip balm (CARMEX) ointment, , Topical, PRN, Vann, Jessica U, DO   LORazepam  (ATIVAN ) injection 2 mg, 2 mg, Intramuscular, Q12H PRN, Rella Cardinal, FNP, 2 mg at 11/08/23 2152   ondansetron  (ZOFRAN ) tablet 4 mg, 4 mg, Oral, Q6H PRN **OR** ondansetron  (ZOFRAN ) injection 4 mg, 4 mg, Intravenous, Q6H PRN, Lovell Rubenstein, NP   risperiDONE  (RISPERDAL ) tablet 1 mg, 1 mg, Oral, BID, Starkes-Perry, Rawley Harju S, FNP, 1 mg at 11/08/23 2101   senna-docusate (Senokot-S) tablet 1 tablet, 1 tablet, Oral, BID, Vann, Jessica U, DO, 1 tablet at 11/08/23 2101   sodium chloride  flush (NS) 0.9 % injection 3 mL, 3 mL, Intravenous, Q12H, Lovell Rubenstein, NP, 3 mL at 11/07/23 2116  Allergies: No  Known Allergies  Rella Cardinal, FNP

## 2023-11-10 DIAGNOSIS — E871 Hypo-osmolality and hyponatremia: Secondary | ICD-10-CM | POA: Diagnosis not present

## 2023-11-10 MED ORDER — HYDROCORTISONE 5 MG PO TABS: 7.5000 mg | ORAL_TABLET | Freq: Every morning | ORAL | Status: DC

## 2023-11-10 MED ORDER — CARMEX CLASSIC LIP BALM EX OINT: TOPICAL_OINTMENT | CUTANEOUS | Status: DC | PRN

## 2023-11-10 MED ORDER — HALOPERIDOL 5 MG PO TABS: 5.0000 mg | ORAL_TABLET | Freq: Three times a day (TID) | ORAL | 0 refills | Status: DC | PRN

## 2023-11-10 MED ORDER — RISPERIDONE 1 MG PO TABS: 1.0000 mg | ORAL_TABLET | Freq: Two times a day (BID) | ORAL | 0 refills | Status: DC

## 2023-11-10 MED ORDER — ENSURE ENLIVE PO LIQD: 237.0000 mL | Freq: Three times a day (TID) | ORAL | 12 refills | Status: DC

## 2023-11-10 MED ORDER — HYDROCORTISONE 5 MG PO TABS: 2.5000 mg | ORAL_TABLET | Freq: Every day | ORAL | Status: DC

## 2023-11-10 NOTE — Progress Notes (Signed)
 PT Cancellation Note  Patient Details Name: Kaitlyn Duncan MRN: [id number] DOB: Aug 15, 1970   Cancelled Treatment:    Reason Eval/Treat Not Completed: Fatigue/lethargy limiting ability to participate (pt sleeping soundly, briefly aroused to verbal stimuli but could not keep her eyes open more than a few seconds. Will check back later.)  Daymon Evans PT 11/10/2023  Acute Rehabilitation Services  Office [phone]

## 2023-11-10 NOTE — Discharge Summary (Signed)
 Physician Discharge Summary  Kaitlyn Duncan ZOX:[id number] DOB: 1970/09/18 DOA: 11/02/2023  PCP: Adelia Homestead, MD  Admit date: 11/02/2023 Discharge date: 11/10/2023 Discharging to: SNF Recommendations for Outpatient Follow-up:  Please refer to endocrinologist for further management of adrenal insufficiency Please follow oral intake closely Please recheck sodium in about a week  Consults:  Psychiatry     Discharge Diagnoses:   Principal Problem:   Acute hyponatremia Active Problems:   Schizophrenia spectrum disorder with psychotic disorder type not yet determined [facility])   Hyponatremia     Hospital Course:  This is a 53 year old female with schizophrenia, chronic thrombocytopenia, history of CVAs, malignant melanoma with mets to the brain status post chemotherapy who presents to the hospital for falls. She has had 2 falls in 1 week while at [facility] where the patient resides for long-term care.  Per triage note, EMS stated that the patient had not taken her medication in 6 months.  At baseline she walks with a walker.  In the ED, she complained of right elbow, right hip thigh and lower back pain. In the hospital she was found to have a sodium of 120, potassium 3.4 and was therefore hospitalized.  On exam she was noted to be dehydrated and had tenderness in the lumbar area, right elbow, right hip, right thigh and right knee.  X-rays revealed osteopenia. She was admitted for IV fluid hydration.  Of note, her oncology team had ordered up a PET scan and an MRI of the brain but the patient was refusing to have these performed. In the hospital, the patient Underwent an MRI of the brain which was negative for metastasis   Assessment and Plan: Principal Problem:   Acute hyponatremia-dehydration-probable adrenal insufficiency - MRI brain negative for metastatic disease - Given 1 L normal saline bolus in the ED followed by 75 cc an hour until 11/04/2023 - Prozac mirtazapine  olanzapine  held  - Sodium has steadily improved to 132 - Cosyntropin  stim test performed on 5/2 was positive - Subsequently found to have a low ACTH-4.3-suspected to have adrenal insufficiency and therefore started on Solu-Cortef  on 5/7   Active problems:   Schizophrenia spectrum disorder with psychotic disorder type not yet determined (HCC) -Appreciate management by psychiatry-they have discontinued fluoxetine, mirtazapine, olanzapine  -she is currently receiving Risperdal  twice daily she did require a dose of Haldol  and Ativan  on 5/6 and therefore will be discharged with Risperdal  and as needed Haldol  -She also receives Invega Sustenna     Frequent falls - Returning to [name]    History of malignant melanoma with brain metastasis - Has undergone treatment and is on surveillance   Pancytopenia - Stable  Body mass index is 21.53 kg/m.           Discharge Instructions  Discharge Instructions     No wound care   Complete by: As directed       Allergies as of 11/10/2023   No Known Allergies      Medication List     STOP taking these medications    FLUoxetine 10 MG capsule Commonly known as: PROZAC   mirtazapine 7.5 MG tablet Commonly known as: REMERON   OLANZapine  2.5 MG tablet Commonly known as: ZYPREXA    risperiDONE  1 MG/ML oral solution Commonly known as: RISPERDAL  Replaced by: risperiDONE  1 MG tablet       TAKE these medications    acetaminophen  325 MG tablet Commonly known as: TYLENOL  Take 650 mg by mouth every 6 (six) hours  as needed for moderate pain (pain score 4-6).   Amitiza 24 MCG capsule Generic drug: lubiprostone Take 24 mcg by mouth daily.   feeding supplement Liqd Take 237 mLs by mouth 3 (three) times daily between meals.   haloperidol  5 MG tablet Commonly known as: HALDOL  Take 1 tablet (5 mg total) by mouth every 8 (eight) hours as needed for up to 10 doses for agitation.   hydrocortisone  5 MG tablet Commonly known as:  CORTEF  Take 0.5 tablets (2.5 mg total) by mouth daily.   hydrocortisone  5 MG tablet Commonly known as: CORTEF  Take 1.5 tablets (7.5 mg total) by mouth in the morning. Start taking on: Nov 11, 2023   hydrOXYzine  25 MG tablet Commonly known as: ATARAX  Take 25 mg by mouth 3 (three) times daily.   Invega Sustenna injection Generic drug: Paliperidone ER Inject 78 mg into the muscle every 30 (thirty) days.   lip balm ointment Apply topically as needed.   ondansetron  4 MG tablet Commonly known as: ZOFRAN  Take 4 mg by mouth every 8 (eight) hours as needed for vomiting or nausea.   risperiDONE  1 MG tablet Commonly known as: RISPERDAL  Take 1 tablet (1 mg total) by mouth 2 (two) times daily. Replaces: risperiDONE  1 MG/ML oral solution   rosuvastatin  40 MG tablet Commonly known as: CRESTOR  Take 1 tablet (40 mg total) by mouth daily.   sennosides-docusate sodium  8.6-50 MG tablet Commonly known as: SENOKOT-S Take 1 tablet by mouth 2 (two) times daily.            The results of significant diagnostics from this hospitalization (including imaging, microbiology, ancillary and laboratory) are listed below for reference.    MR BRAIN W WO CONTRAST Result Date: 11/03/2023 CLINICAL DATA:  Metastatic disease evaluation EXAM: MRI HEAD WITHOUT AND WITH CONTRAST TECHNIQUE: Multiplanar, multiecho pulse sequences of the brain and surrounding structures were obtained without and with intravenous contrast. CONTRAST:  5mL GADAVIST  GADOBUTROL  1 MMOL/ML IV SOLN COMPARISON:  CT head March 2, 25. FINDINGS: Brain: No acute infarction, hemorrhage, hydrocephalus, extra-axial collection or mass lesion. Moderate patchy T2/FLAIR hyperintensities in the white matter nonspecific but compatible with chronic microvascular ischemic disease. Remote lacunar infarct in the left thalamus. Remote lacunar infarct in the right cerebellum. No pathologic enhancement. Mildly motion limited. Vascular: Major arterial flow voids  are maintained. Skull and upper cervical spine: Normal marrow signal. Sinuses/Orbits: Negative. Other: No mastoid effusions. IMPRESSION: No evidence of acute intracranial abnormality or metastatic disease. Electronically Signed   By: Stevenson Elbe M.D.   On: 11/03/2023 10:04   DG Lumbar Spine Complete Result Date: 11/02/2023 CLINICAL DATA:  Low back pain after multiple falls in the last week. EXAM: LUMBAR SPINE - COMPLETE 4+ VIEW COMPARISON:  September 24, 2021. FINDINGS: There is no evidence of lumbar spine fracture. Alignment is normal. Moderate degenerative disc disease is noted at L5-S1. IMPRESSION: Moderate degenerative disc disease at L5-S1. No acute abnormality seen. Electronically Signed   By: Rosalene Colon M.D.   On: 11/02/2023 13:00   DG HIP UNILAT W OR W/O PELVIS 2-3 VIEWS RIGHT Result Date: 11/02/2023 CLINICAL DATA:  Fall and trauma to the right lower extremity. EXAM: RIGHT FEMUR 2 VIEWS; DG HIP (WITH OR WITHOUT PELVIS) 2-3V RIGHT; RIGHT KNEE - COMPLETE 4+ VIEW COMPARISON:  None Available. FINDINGS: There is no acute fracture or dislocation. The bones are osteopenic. Mild arthritic changes of the right hip and knee. The soft tissues are unremarkable. IMPRESSION: 1. No acute fracture or dislocation.  2. Osteopenia. Electronically Signed   By: Angus Bark M.D.   On: 11/02/2023 12:55   DG Femur Min 2 Views Right Result Date: 11/02/2023 CLINICAL DATA:  Fall and trauma to the right lower extremity. EXAM: RIGHT FEMUR 2 VIEWS; DG HIP (WITH OR WITHOUT PELVIS) 2-3V RIGHT; RIGHT KNEE - COMPLETE 4+ VIEW COMPARISON:  None Available. FINDINGS: There is no acute fracture or dislocation. The bones are osteopenic. Mild arthritic changes of the right hip and knee. The soft tissues are unremarkable. IMPRESSION: 1. No acute fracture or dislocation. 2. Osteopenia. Electronically Signed   By: Angus Bark M.D.   On: 11/02/2023 12:55   DG Knee Complete 4 Views Right Result Date: 11/02/2023 CLINICAL DATA:   Fall and trauma to the right lower extremity. EXAM: RIGHT FEMUR 2 VIEWS; DG HIP (WITH OR WITHOUT PELVIS) 2-3V RIGHT; RIGHT KNEE - COMPLETE 4+ VIEW COMPARISON:  None Available. FINDINGS: There is no acute fracture or dislocation. The bones are osteopenic. Mild arthritic changes of the right hip and knee. The soft tissues are unremarkable. IMPRESSION: 1. No acute fracture or dislocation. 2. Osteopenia. Electronically Signed   By: Angus Bark M.D.   On: 11/02/2023 12:55   DG ELBOW COMPLETE RIGHT (3+VIEW) Result Date: 11/02/2023 CLINICAL DATA:  Two falls over the last week.  Right elbow pain. EXAM: RIGHT ELBOW - COMPLETE 3+ VIEW COMPARISON:  None Available. FINDINGS: There is no evidence of fracture, dislocation, or joint effusion. There is no evidence of arthropathy or other focal bone abnormality. Soft tissues are unremarkable. IMPRESSION: Negative right elbow radiographs Electronically Signed   By: Audree Leas M.D.   On: 11/02/2023 12:52   Labs:   Basic Metabolic Panel: Recent Labs  Lab 11/05/23 0941 11/06/23 0709 11/07/23 0523 11/08/23 0620 11/09/23 1101  NA 130* 134* 132* 130* 132*  K 3.5 3.8 3.7 3.6 3.5  CL 99 103 100 99 99  CO2 21* 24 24 22 24   GLUCOSE 97 82 81 82 85  BUN 12 15 12 15  24*  CREATININE 0.49 0.58 0.43* 0.52 0.72  CALCIUM  8.9 9.2 8.6* 8.8* 9.2     CBC: Recent Labs  Lab 11/04/23 0541 11/05/23 0941 11/06/23 0709 11/07/23 0523 11/09/23 1101  WBC 2.7* 3.2* 3.4* 3.4* 3.4*  HGB 10.6* 11.2* 11.0* 10.3* 11.1*  HCT 33.2* 33.9* 34.7* 32.6* 34.9*  MCV 84.5 81.5 85.7 85.6 83.7  PLT 72* 73* 79* 79* 90*         SIGNED:   Sedalia Dacosta, MD  [facility] 11/10/2023, 2:21 PM Time taking on discharge: 50 minutes

## 2023-11-10 NOTE — Plan of Care (Signed)
  Problem: Education: Goal: Knowledge of General Education information will improve Description: Including pain rating scale, medication(s)/side effects and non-pharmacologic comfort measures Outcome: Progressing   Problem: Clinical Measurements: Goal: Ability to maintain clinical measurements within normal limits will improve Outcome: Progressing Goal: Will remain free from infection Outcome: Progressing Goal: Diagnostic test results will improve Outcome: Progressing Goal: Respiratory complications will improve Outcome: Progressing Goal: Cardiovascular complication will be avoided Outcome: Progressing   Problem: Activity: Goal: Risk for activity intolerance will decrease Outcome: Progressing   Problem: Nutrition: Goal: Adequate nutrition will be maintained Outcome: Progressing   Problem: Elimination: Goal: Will not experience complications related to bowel motility Outcome: Progressing Goal: Will not experience complications related to urinary retention Outcome: Progressing   Problem: Pain Managment: Goal: General experience of comfort will improve and/or be controlled Outcome: Progressing   Problem: Safety: Goal: Ability to remain free from injury will improve Outcome: Progressing   Problem: Skin Integrity: Goal: Risk for impaired skin integrity will decrease Outcome: Progressing

## 2023-11-10 NOTE — TOC Progression Note (Addendum)
 Transition of Care [facility]) - Progression Note    Patient Details  Name: Kaitlyn Duncan MRN: [id number] Date of Birth: 03/31/1971  Transition of [facility]) CM/SW Contact  Kaitlyn Rodriguez, RN Phone Number:[phone]  11/10/2023, 12:36 PM  Clinical Narrative:    Per MD patient is medically ready for discharge. CM spoke with Kaitlyn Duncan who confirms that patient is able to return to [name] today. CM spoke with Legal guardian Kaitlyn Duncan to update on plan. Guardian would like to discuss scan results with MD. Message has been sent to MD.    5/8 Notice of commitment change has been submitted and uploaded into chart, Envelope # [id number]. IVC has been rescinded.   5/8 FL2 has been updated awaiting discharge summary.    Expected Discharge Plan: Long Term Acute Care (LTAC) Barriers to Discharge: Continued Medical Work up  Expected Discharge Plan and Services In-house Referral: NA Discharge Planning Services: NA Post Acute Care Choice: Skilled Nursing Facility Living arrangements for the past 2 months: Skilled Nursing Facility Expected Discharge Date: 11/10/23               DME Arranged: N/A DME Agency: NA       HH Arranged: NA HH Agency: NA         Social Determinants of Health (SDOH) Interventions SDOH Screenings   Food Insecurity: Food Insecurity Present (08/04/2022)  Housing: High Risk (04/09/2022)  Transportation Needs: Unmet Transportation Needs (08/04/2022)  Utilities: Not At Risk (08/04/2022)  Alcohol Screen: Low Risk  (06/27/2021)  Depression (PHQ2-9): Low Risk  (07/21/2022)  Tobacco Use: Low Risk  (11/02/2023)    Readmission Risk Interventions    08/04/2022    3:35 PM  Readmission Risk Prevention Plan  Transportation Screening Complete  PCP or Specialist Appt within 3-5 Days Complete  HRI or Home Care Consult Complete  Social Work Consult for Recovery Care Planning/Counseling Complete  Palliative Care Screening Complete  Medication Review Furniture conservator/restorer) Complete

## 2023-11-10 NOTE — Consult Note (Signed)
 [facility] Consult Follow-up  Patient Name: .Kaitlyn Duncan  MRN: [id number]  DOB: 30-Jul-1970  Consult Order details:  Orders (From admission, onward)     Start     Ordered   11/03/23 0811  IP CONSULT TO PSYCHIATRY       Ordering Provider: Enrigue Harvard, DO  Provider:  (Not yet assigned)  Question Answer Comment  Location [facility]   Reason for Consult? medication management/optimization (has been off meds for around 6 month)      11/03/23 0813             Mode of Visit: In person    Psychiatry Consult Evaluation  Service Date: Nov 10, 2023 LOS:  LOS: 8 days  Chief Complaint "I feel fine"  Primary Psychiatric Diagnoses  Schizophrenia, undifferentiated   Assessment  Kaitlyn Duncan is a 53 y.o. female admitted: Medicallyfor 11/02/2023 10:19 AM for hyponatremia. She carries the psychiatric diagnoses of schizophrenia and has a past medical history of GERD, anemia of chronic disease, chronic thrombocytopenia, history of strokes, and history of malignant melanoma with mets to the brain status postchemotherapy.   The patient was seen and assessed at bedside for non-compliance with psychiatric medications over the past six months and longstanding delusional disorder characterized by fixed beliefs denying her diagnosis of schizophrenia. She was admitted medically for severe hyponatremia and recurrent falls. Collateral information confirms she has been off antipsychotic medications for more than six months while residing at a nursing facility, consistently denying any psychiatric illness. Attempts to engage her in discussion regarding her psychiatric history or medications were met with irritability and irrational responses.  11/10/2023 During evaluation Barclay Boop Oshinski is seated at the edge of hospital bed. She is alert/oriented x 3; calm/cooperative; and mood congruent with affect.  Patient is speaking in a clear tone at moderate  volume, and normal pace; with good eye contact.  Her thought process is coherent and relevant; There is no indication that she is currently responding to internal/external stimuli or experiencing delusional thought content.  Patient denies suicidal/self-harm/homicidal ideation, psychosis, and paranoia.  Patient has remained calm throughout assessment and has answered questions appropriately.  Her current findings are incongruent with information obtained in IVC. She appears to be back at her baseline, and maybe discharged at this time.   Diagnoses:  Active Hospital problems: Principal Problem:   Acute hyponatremia Active Problems:   h/o Malignant neoplasm metastatic to brain [facility])   Schizophrenia spectrum disorder with psychotic disorder type not yet determined (HCC)    Plan   ## Psychiatric Medication Recommendations Continue to manage medically with attention to correcting hyponatremia.  Engage her legal guardian for ongoing treatment decisions.  If ethical concerns escalate (e.g., ongoing medical deterioration with treatment refusal), recommend consulting the hospital ethics committee.  If appropriate, trial restarting psychiatric medications only after stabilization of medical issues, ensuring full disclosure to the patient to minimize suspicion and paranoia.   Risk vs. Benefit Analysis: Initiating psychotropic medications against her will at this time would not offer immediate benefit and may worsen her medical condition, particularly her hyponatremia.  Certain medications (e.g., SSRIs, antiepileptics, quetiapine , aripiprazole, clozapine, haloperidol ) are known to exacerbate hyponatremia and are not recommended until the underlying cause of electrolyte disturbance is fully identified and corrected.   -PRN agitation orders placed for IM medications. Patient pulled IV. May need to consider force meds if patient does not respond to normal therapeutic interventions.   ## Medical  Decision Making  Capacity: Not specifically addressed in this encounter  ## Further Work-up:  -- most recent EKG on 5/1 had QtC of 464. WIll repeat once cooperative.  -- Pertinent labwork reviewed earlier this admission includes: CBC, Chem panel, urine and EKG   ## Disposition:-- There are no psychiatric contraindications to discharge at this time  ## Behavioral / Environmental: - No specific recommendations at this time.  -IVC to be completed by primary team.   ## Safety and Observation Level:  - Based on my clinical evaluation, I estimate the patient to be at low risk of self harm in the current setting. - At this time, we recommend  routine. This decision is based on my review of the chart including patient's history and current presentation, interview of the patient, mental status examination, and consideration of suicide risk including evaluating suicidal ideation, plan, intent, suicidal or self-harm behaviors, risk factors, and protective factors. This judgment is based on our ability to directly address suicide risk, implement suicide prevention strategies, and develop a safety plan while the patient is in the clinical setting. Please contact our team if there is a concern that risk level has changed.  CSSR Risk Category:C-SSRS RISK CATEGORY: No Risk  Suicide Risk Assessment: Patient has following modifiable risk factors for suicide: medication noncompliance, which we are addressing by monitoring her symptoms. Patient has following non-modifiable or demographic risk factors for suicide: psychiatric hospitalization Patient has the following protective factors against suicide: Supportive family  Thank you for this consult request. Recommendations have been communicated to the primary team.  We will sign off.   Rella Cardinal, FNP       History of Present Illness  Relevant Aspects of [facility] Course:  Admitted on 11/02/2023 for hyponatremia. They were consulted for  medication management which she does not want.  Patient Report:  Patient describes her mood as fine. SHe is eating on the side of her bed and engages appropriately. She denies current psychiatric symptoms. No acute concerns at this time.   Psych ROS:  Depression: denied Anxiety:  denied Mania (lifetime and current): past Psychosis: (lifetime and current): past   Review of Systems  Psychiatric/Behavioral: Negative.    All other systems reviewed and are negative.    Psychiatric and Social History  Psychiatric History:  Information collected from patient and chart.  Prev Dx/Sx: schizophrenia, undifferentiated Current Psych Provider: ACT team noted Home Meds (current): Prozac, Risperdal ,Zyprexa  Previous Med Trials: Prolixin  Therapy: none  Prior Psych Hospitalization: multiple  Prior Self Harm: none Prior Violence: none  Family Psych History: unknown Family Hx suicide: none  Social History:  Living Situation: nursing home  Access to weapons/lethal means: none   Substance History No substance use or history  Exam Findings  Physical Exam:  Vital Signs:  Temp:  [97.5 F (36.4 C)-98.5 F (36.9 C)] 98.5 F (36.9 C) (05/08 1423) Pulse Rate:  [67-95] 83 (05/08 1423) Resp:  [14-16] 16 (05/08 1423) BP: (110-122)/(64-84) 110/77 (05/08 1423) SpO2:  [96 %-100 %] 100 % (05/08 1423) Blood pressure 110/77, pulse 83, temperature 98.5 F (36.9 C), temperature source Oral, resp. rate 16, height 5' (1.524 m), weight 50 kg, SpO2 100%. Body mass index is 21.53 kg/m.  Physical Exam Vitals and nursing note reviewed.  Constitutional:      Appearance: Normal appearance.  Neurological:     Mental Status: She is alert.  Psychiatric:        Attention and Perception: Attention and perception normal. She is attentive.  Mood and Affect: Mood is not anxious. Affect is blunt. Affect is not angry.        Speech: Speech normal.        Behavior: Behavior normal. Behavior is  cooperative.        Thought Content: Thought content normal. Thought content is not delusional.        Cognition and Memory: Cognition is impaired. Memory is impaired. She exhibits impaired recent memory.        Judgment: Judgment normal. Judgment is not impulsive or inappropriate.     Mental Status Exam: Deferred patient asleep   Other History   These have been pulled in through the EMR, reviewed, and updated if appropriate.  Family History:  The patient's family history includes Breast cancer in her maternal aunt, maternal grandmother, paternal aunt, and paternal grandmother; Diabetes in her paternal aunt and paternal grandmother; Hypertension in her father; Melanoma in her mother.  Medical History: Past Medical History:  Diagnosis Date   Allergy    Anemia    Gallstones    GERD (gastroesophageal reflux disease)    Hemorrhoids    melanoma with met dz dx'd 01/2018   brain and adrenal   Seasonal allergies    Stroke [facility])     Surgical History: Past Surgical History:  Procedure Laterality Date   BIOPSY  01/28/2018   Procedure: BIOPSY;  Surgeon: Jolinda Necessary, MD;  Location: [facility] ENDOSCOPY;  Service: Endoscopy;;   BREAST BIOPSY Left    ESOPHAGOGASTRODUODENOSCOPY N/A 01/28/2018   Procedure: ESOPHAGOGASTRODUODENOSCOPY (EGD);  Surgeon: Jolinda Necessary, MD;  Location: [facility];  Service: Endoscopy;  Laterality: N/A;   MOUTH SURGERY     OVARY SURGERY     removed "something"     Medications:   Current Facility-Administered Medications:    acetaminophen  (TYLENOL ) tablet 650 mg, 650 mg, Oral, Q6H PRN **OR** acetaminophen  (TYLENOL ) suppository 650 mg, 650 mg, Rectal, Q6H PRN, Lovell Rubenstein, NP   feeding supplement (ENSURE ENLIVE / ENSURE PLUS) liquid 237 mL, 237 mL, Oral, BID BM, Rizwan, Saima, MD, 237 mL at 11/10/23 1422   haloperidol  (HALDOL ) tablet 5 mg, 5 mg, Oral, Q6H PRN **OR** haloperidol  lactate (HALDOL ) injection 5 mg, 5 mg, Intramuscular, Q6H PRN, Vann, Jessica U, DO    hydrocortisone  (CORTEF ) tablet 2.5 mg, 2.5 mg, Oral, Daily, Vann, Jessica U, DO, 2.5 mg at 11/10/23 1421   hydrocortisone  (CORTEF ) tablet 7.5 mg, 7.5 mg, Oral, q AM, Vann, Jessica U, DO, 7.5 mg at 11/10/23 0944   lip balm (CARMEX) ointment, , Topical, PRN, Vann, Jessica U, DO   LORazepam  (ATIVAN ) injection 2 mg, 2 mg, Intramuscular, Q12H PRN, Rella Cardinal, FNP, 2 mg at 11/08/23 2152   ondansetron  (ZOFRAN ) tablet 4 mg, 4 mg, Oral, Q6H PRN **OR** ondansetron  (ZOFRAN ) injection 4 mg, 4 mg, Intravenous, Q6H PRN, Lovell Rubenstein, NP   risperiDONE  (RISPERDAL ) tablet 1 mg, 1 mg, Oral, BID, Starkes-Perry, Lakeesha Fontanilla S, FNP, 1 mg at 11/10/23 0944   senna-docusate (Senokot-S) tablet 1 tablet, 1 tablet, Oral, BID, Vann, Jessica U, DO, 1 tablet at 11/10/23 0944   sodium chloride  flush (NS) 0.9 % injection 3 mL, 3 mL, Intravenous, Q12H, Lovell Rubenstein, NP, 3 mL at 11/07/23 2116  Allergies: No Known Allergies  Rella Cardinal, FNP

## 2023-11-10 NOTE — TOC Transition Note (Signed)
 Transition of Care [facility]) - Discharge Note   Patient Details  Name: Kaitlyn Duncan MRN: [id number] Date of Birth: 1970-07-25  Transition of Care [facility]) CM/SW Contact:  Loreda Rodriguez, RN Phone Number:[phone]  11/10/2023, 2:53 PM   Clinical Narrative:    Transportation has been arranged per PTAR. Discharge packet is at nurses station in chart. No other TOC needs noted. TOC will sign off.   RN please call report to Cristino Donna  [phone]     Barriers to Discharge: Continued Medical Work up   Patient Goals and CMS Choice Patient states their goals for this hospitalization and ongoing recovery are:: patient not willing to participate          Discharge Placement                       Discharge Plan and Services Additional resources added to the After Visit Summary for   In-house Referral: NA Discharge Planning Services: NA Post Acute Care Choice: Skilled Nursing Facility          DME Arranged: N/A DME Agency: NA       HH Arranged: NA HH Agency: NA        Social Drivers of Health (SDOH) Interventions SDOH Screenings   Food Insecurity: Food Insecurity Present (08/04/2022)  Housing: High Risk (04/09/2022)  Transportation Needs: Unmet Transportation Needs (08/04/2022)  Utilities: Not At Risk (08/04/2022)  Alcohol Screen: Low Risk  (06/27/2021)  Depression (PHQ2-9): Low Risk  (07/21/2022)  Tobacco Use: Low Risk  (11/02/2023)     Readmission Risk Interventions    08/04/2022    3:35 PM  Readmission Risk Prevention Plan  Transportation Screening Complete  PCP or Specialist Appt within 3-5 Days Complete  HRI or Home Care Consult Complete  Social Work Consult for Recovery Care Planning/Counseling Complete  Palliative Care Screening Complete  Medication Review Oceanographer) Complete

## 2023-11-10 NOTE — NC FL2 (Signed)
 [place]  MEDICAID FL2 LEVEL OF CARE FORM     IDENTIFICATION  Patient Name: Kaitlyn Duncan Birthdate: 03/05/1971 Sex: female Admission Date (Current Location): 11/02/2023  [place] and [place] Number:  Producer, television/film/video and Address:  [facility],  [address], [place] [address]      Provider Number: [id number]  Attending Physician Name and Address:  Sedalia Dacosta, MD  Relative Name and Phone Number:  Elyn Han [phone]    Current Level of Care: Hospital Recommended Level of Care: Skilled Nursing Facility Prior Approval Number:    Date Approved/Denied:   PASRR Number: [id number]  Discharge Plan: SNF    Current Diagnoses: Patient Active Problem List   Diagnosis Date Noted   Hyponatremia 11/03/2023   Acute hyponatremia 11/02/2023   UTI (urinary tract infection) 08/04/2022   Falls 08/03/2022   Constipation 07/23/2022   Involuntary commitment 06/07/2022   Frequent falls 04/09/2022   Allergic rhinitis 04/09/2022   Hx of completed stroke 04/08/2022   Prediabetes 12/24/2021   Frequent urination 12/24/2021   Undifferentiated schizophrenia (HCC) 09/25/2021   Leukopenia 06/27/2021   Thrombocytopenia (HCC) 06/27/2021   Schizophrenia spectrum disorder with psychotic disorder type not yet determined (HCC) 06/26/2021   Has difficulty accessing primary care provider for most visits 06/15/2021   Candidiasis of skin 01/23/2021   Musculoskeletal pain 01/23/2021   Psychosis (HCC)    Melanoma of skin (HCC) 05/29/2018   Goals of care, counseling/discussion 05/29/2018   Malignant neoplasm metastatic to brain (HCC) 01/26/2018    Orientation RESPIRATION BLADDER Height & Weight     Self, Time, Place  Normal Continent Weight: 50 kg Height:  5' (152.4 cm)  BEHAVIORAL SYMPTOMS/MOOD NEUROLOGICAL BOWEL NUTRITION STATUS     (n/a) Continent Diet (Regular)  AMBULATORY STATUS COMMUNICATION OF NEEDS Skin   Limited Assist Verbally Normal                        Personal Care Assistance Level of Assistance  Bathing, Feeding, Dressing Bathing Assistance: Limited assistance Feeding assistance: Independent Dressing Assistance: Limited assistance     Functional Limitations Info  Sight, Hearing, Speech Sight Info: Adequate Hearing Info: Adequate Speech Info: Adequate    SPECIAL CARE FACTORS FREQUENCY                       Contractures Contractures Info: Not present    Additional Factors Info  Code Status, Allergies, Psychotropic, Insulin Sliding Scale, Isolation Precautions Code Status Info: Full Allergies Info: no known drug allergies Psychotropic Info: see discharge summary Insulin Sliding Scale Info: see d/c summary Isolation Precautions Info: n/a     Current Medications (11/10/2023):  This is the current hospital active medication list Current Facility-Administered Medications  Medication Dose Route Frequency Provider Last Rate Last Admin   acetaminophen  (TYLENOL ) tablet 650 mg  650 mg Oral Q6H PRN Lovell Rubenstein, NP       Or   acetaminophen  (TYLENOL ) suppository 650 mg  650 mg Rectal Q6H PRN Lovell Rubenstein, NP       feeding supplement (ENSURE ENLIVE / ENSURE PLUS) liquid 237 mL  237 mL Oral BID BM Rizwan, Saima, MD   237 mL at 11/10/23 0946   haloperidol  (HALDOL ) tablet 5 mg  5 mg Oral Q6H PRN Vann, Jessica U, DO       Or   haloperidol  lactate (HALDOL ) injection 5 mg  5 mg Intramuscular Q6H PRN Vann, Jessica U, DO  hydrocortisone  (CORTEF ) tablet 2.5 mg  2.5 mg Oral Daily Vann, Jessica U, DO       hydrocortisone  (CORTEF ) tablet 7.5 mg  7.5 mg Oral q AM Vann, Jessica U, DO   7.5 mg at 11/10/23 0944   lip balm (CARMEX) ointment   Topical PRN Vann, Jessica U, DO       LORazepam  (ATIVAN ) injection 2 mg  2 mg Intramuscular Q12H PRN Starkes-Perry, Takia S, FNP   2 mg at 11/08/23 2152   ondansetron  (ZOFRAN ) tablet 4 mg  4 mg Oral Q6H PRN Lovell Rubenstein, NP       Or   ondansetron  (ZOFRAN ) injection 4 mg  4  mg Intravenous Q6H PRN Lovell Rubenstein, NP       risperiDONE  (RISPERDAL ) tablet 1 mg  1 mg Oral BID Starkes-Perry, Takia S, FNP   1 mg at 11/10/23 0944   senna-docusate (Senokot-S) tablet 1 tablet  1 tablet Oral BID Vann, Jessica U, DO   1 tablet at 11/10/23 0944   sodium chloride  flush (NS) 0.9 % injection 3 mL  3 mL Intravenous Q12H Lovell Rubenstein, NP   3 mL at 11/07/23 2116     Discharge Medications: Please see discharge summary for a list of discharge medications.  Relevant Imaging Results:  Relevant Lab Results:   Additional Information SS# [id number]  Loreda Rodriguez, RN

## 2023-12-28 ENCOUNTER — Inpatient Hospital Stay (HOSPITAL_COMMUNITY)
Admission: EM | Admit: 2023-12-28 | Discharge: 2023-12-29 | DRG: 640 | Disposition: A | Discharge status: Skilled Nursing Facility | Payer: Medicare (Managed Care) | Source: Skilled Nursing Facility | Attending: Internal Medicine | Admitting: Internal Medicine

## 2023-12-28 ENCOUNTER — Encounter (HOSPITAL_COMMUNITY): Payer: Self-pay

## 2023-12-28 ENCOUNTER — Other Ambulatory Visit: Payer: Self-pay

## 2023-12-28 ENCOUNTER — Emergency Department (HOSPITAL_COMMUNITY): Payer: Medicare (Managed Care)

## 2023-12-28 DIAGNOSIS — G9341 Metabolic encephalopathy: Secondary | ICD-10-CM | POA: Diagnosis present

## 2023-12-28 DIAGNOSIS — Z9181 History of falling: Secondary | ICD-10-CM

## 2023-12-28 DIAGNOSIS — F209 Schizophrenia, unspecified: Secondary | ICD-10-CM | POA: Diagnosis present

## 2023-12-28 DIAGNOSIS — F29 Unspecified psychosis not due to a substance or known physiological condition: Secondary | ICD-10-CM | POA: Diagnosis present

## 2023-12-28 DIAGNOSIS — S0990XA Unspecified injury of head, initial encounter: Secondary | ICD-10-CM | POA: Diagnosis present

## 2023-12-28 DIAGNOSIS — Z8673 Personal history of transient ischemic attack (TIA), and cerebral infarction without residual deficits: Secondary | ICD-10-CM

## 2023-12-28 DIAGNOSIS — Z8249 Family history of ischemic heart disease and other diseases of the circulatory system: Secondary | ICD-10-CM

## 2023-12-28 DIAGNOSIS — W19XXXA Unspecified fall, initial encounter: Secondary | ICD-10-CM | POA: Diagnosis present

## 2023-12-28 DIAGNOSIS — R296 Repeated falls: Secondary | ICD-10-CM | POA: Diagnosis present

## 2023-12-28 DIAGNOSIS — Z803 Family history of malignant neoplasm of breast: Secondary | ICD-10-CM

## 2023-12-28 DIAGNOSIS — D630 Anemia in neoplastic disease: Secondary | ICD-10-CM | POA: Diagnosis present

## 2023-12-28 DIAGNOSIS — C7931 Secondary malignant neoplasm of brain: Secondary | ICD-10-CM | POA: Diagnosis present

## 2023-12-28 DIAGNOSIS — C439 Malignant melanoma of skin, unspecified: Secondary | ICD-10-CM | POA: Diagnosis present

## 2023-12-28 DIAGNOSIS — Z808 Family history of malignant neoplasm of other organs or systems: Secondary | ICD-10-CM

## 2023-12-28 DIAGNOSIS — Z85820 Personal history of malignant melanoma of skin: Secondary | ICD-10-CM

## 2023-12-28 DIAGNOSIS — Z833 Family history of diabetes mellitus: Secondary | ICD-10-CM | POA: Diagnosis not present

## 2023-12-28 DIAGNOSIS — E860 Dehydration: Secondary | ICD-10-CM | POA: Diagnosis present

## 2023-12-28 DIAGNOSIS — E876 Hypokalemia: Secondary | ICD-10-CM | POA: Diagnosis present

## 2023-12-28 DIAGNOSIS — D696 Thrombocytopenia, unspecified: Secondary | ICD-10-CM | POA: Diagnosis present

## 2023-12-28 DIAGNOSIS — Z87440 Personal history of urinary (tract) infections: Secondary | ICD-10-CM | POA: Diagnosis not present

## 2023-12-28 DIAGNOSIS — D72819 Decreased white blood cell count, unspecified: Secondary | ICD-10-CM | POA: Diagnosis present

## 2023-12-28 DIAGNOSIS — E871 Hypo-osmolality and hyponatremia: Principal | ICD-10-CM | POA: Diagnosis present

## 2023-12-28 DIAGNOSIS — K219 Gastro-esophageal reflux disease without esophagitis: Secondary | ICD-10-CM | POA: Diagnosis present

## 2023-12-28 DIAGNOSIS — Z79899 Other long term (current) drug therapy: Secondary | ICD-10-CM | POA: Diagnosis not present

## 2023-12-28 DIAGNOSIS — R531 Weakness: Secondary | ICD-10-CM

## 2023-12-28 LAB — CBC
HCT: 34.5 % — ABNORMAL LOW (ref 36.0–46.0)
Hemoglobin: 11.4 g/dL — ABNORMAL LOW (ref 12.0–15.0)
MCH: 27 pg (ref 26.0–34.0)
MCHC: 33 g/dL (ref 30.0–36.0)
MCV: 81.8 fL (ref 80.0–100.0)
Platelets: 60 10*3/uL — ABNORMAL LOW (ref 150–400)
RBC: 4.22 MIL/uL (ref 3.87–5.11)
RDW: 12.7 % (ref 11.5–15.5)
WBC: 4.2 10*3/uL (ref 4.0–10.5)
nRBC: 0 % (ref 0.0–0.2)

## 2023-12-28 LAB — COMPREHENSIVE METABOLIC PANEL WITH GFR
ALT: 17 U/L (ref 0–44)
AST: 30 U/L (ref 15–41)
Albumin: 3.9 g/dL (ref 3.5–5.0)
Alkaline Phosphatase: 70 U/L (ref 38–126)
Anion gap: 6 (ref 5–15)
BUN: 15 mg/dL (ref 6–20)
CO2: 24 mmol/L (ref 22–32)
Calcium: 8.6 mg/dL — ABNORMAL LOW (ref 8.9–10.3)
Chloride: 96 mmol/L — ABNORMAL LOW (ref 98–111)
Creatinine, Ser: 0.68 mg/dL (ref 0.44–1.00)
GFR, Estimated: >60 mL/min (ref >60)
Glucose, Bld: 131 mg/dL — ABNORMAL HIGH (ref 70–99)
Potassium: 3.2 mmol/L — ABNORMAL LOW (ref 3.5–5.1)
Sodium: 126 mmol/L — ABNORMAL LOW (ref 135–145)
Total Bilirubin: 0.5 mg/dL (ref 0.0–1.2)
Total Protein: 6.7 g/dL (ref 6.5–8.1)

## 2023-12-28 LAB — URINALYSIS, ROUTINE W REFLEX MICROSCOPIC
Bilirubin Urine: NEGATIVE
Glucose, UA: NEGATIVE mg/dL
Hgb urine dipstick: NEGATIVE
Ketones, ur: NEGATIVE mg/dL
Leukocytes,Ua: NEGATIVE
Nitrite: NEGATIVE
Protein, ur: NEGATIVE mg/dL
Specific Gravity, Urine: 1.003 — ABNORMAL LOW (ref 1.005–1.030)
pH: 7 (ref 5.0–8.0)

## 2023-12-28 LAB — CBG MONITORING, ED: Glucose-Capillary: 70 mg/dL (ref 70–99)

## 2023-12-28 LAB — TSH: TSH: 1.501 u[IU]/mL (ref 0.350–4.500)

## 2023-12-28 MED ORDER — SENNOSIDES-DOCUSATE SODIUM 8.6-50 MG PO TABS
1.0000 | ORAL_TABLET | Freq: Two times a day (BID) | ORAL | Status: DC
  Administered 2023-12-28 – 2023-12-29 (×2): 1 via ORAL
  Filled 2023-12-28 (×2): qty 1

## 2023-12-28 MED ORDER — HYDROXYZINE HCL 25 MG PO TABS
25.0000 mg | ORAL_TABLET | Freq: Three times a day (TID) | ORAL | Status: DC
  Administered 2023-12-28 – 2023-12-29 (×2): 25 mg via ORAL
  Filled 2023-12-28 (×2): qty 1

## 2023-12-28 MED ORDER — ROSUVASTATIN CALCIUM 20 MG PO TABS
40.0000 mg | ORAL_TABLET | Freq: Every day | ORAL | Status: DC
  Administered 2023-12-28 – 2023-12-29 (×2): 40 mg via ORAL
  Filled 2023-12-28 (×2): qty 2

## 2023-12-28 MED ORDER — ENSURE ENLIVE PO LIQD
237.0000 mL | Freq: Three times a day (TID) | ORAL | Status: DC
  Administered 2023-12-28 – 2023-12-29 (×2): 237 mL via ORAL

## 2023-12-28 MED ORDER — HYDROCORTISONE 5 MG PO TABS
2.5000 mg | ORAL_TABLET | Freq: Every day | ORAL | Status: DC
  Administered 2023-12-29: 2.5 mg via ORAL
  Filled 2023-12-28: qty 1

## 2023-12-28 MED ORDER — ONDANSETRON HCL 4 MG PO TABS
4.0000 mg | ORAL_TABLET | Freq: Four times a day (QID) | ORAL | Status: DC | PRN
Start: 2023-12-28 — End: 2023-12-29

## 2023-12-28 MED ORDER — LUBIPROSTONE 24 MCG PO CAPS
24.0000 ug | ORAL_CAPSULE | Freq: Every day | ORAL | Status: DC
  Administered 2023-12-29: 24 ug via ORAL
  Filled 2023-12-28: qty 1

## 2023-12-28 MED ORDER — ONDANSETRON HCL 4 MG PO TABS: 4.0000 mg | ORAL_TABLET | Freq: Three times a day (TID) | ORAL | Status: DC | PRN

## 2023-12-28 MED ORDER — SODIUM CHLORIDE 0.9 % IV SOLN: INTRAVENOUS | Status: DC

## 2023-12-28 MED ORDER — SODIUM CHLORIDE 0.9 % IV BOLUS
1000.0000 mL | Freq: Once | INTRAVENOUS | Status: AC
  Administered 2023-12-28: 1000 mL via INTRAVENOUS

## 2023-12-28 MED ORDER — HYDROCORTISONE 5 MG PO TABS
7.5000 mg | ORAL_TABLET | Freq: Every day | ORAL | Status: DC
  Administered 2023-12-28: 7.5 mg via ORAL
  Filled 2023-12-28 (×3): qty 2

## 2023-12-28 MED ORDER — HALOPERIDOL 5 MG PO TABS: 5.0000 mg | ORAL_TABLET | Freq: Three times a day (TID) | ORAL | Status: DC | PRN

## 2023-12-28 MED ORDER — ONDANSETRON HCL 4 MG/2ML IJ SOLN: 4.0000 mg | Freq: Four times a day (QID) | INTRAMUSCULAR | Status: DC | PRN

## 2023-12-28 MED ORDER — RISPERIDONE 1 MG PO TABS
1.0000 mg | ORAL_TABLET | Freq: Two times a day (BID) | ORAL | Status: DC
  Administered 2023-12-28 – 2023-12-29 (×2): 1 mg via ORAL
  Filled 2023-12-28 (×4): qty 1

## 2023-12-28 MED ORDER — ACETAMINOPHEN 325 MG PO TABS
650.0000 mg | ORAL_TABLET | Freq: Four times a day (QID) | ORAL | Status: DC | PRN
  Administered 2023-12-28: 650 mg via ORAL
  Filled 2023-12-28: qty 2

## 2023-12-28 NOTE — ED Triage Notes (Addendum)
 Pt to ED via EMS with c/o fatigue today. Per EMS, pt was on toilet at SNF for 45 minutes when staff noticed pt was weaker than usual. Pt awake and alert, but will not answer A&O questions. Pt breathing even and unlabored. Pt denies pain. Per EMS, pt noncompliant with medication.

## 2023-12-28 NOTE — ED Notes (Addendum)
 Attempted to ask more orientation and assessment questions and pt told this nurse to stop it.

## 2023-12-28 NOTE — H&P (Signed)
 History and Physical    Patient: Kaitlyn Duncan FMW:[id number] DOB: 1970/08/01 DOA: 12/28/2023 DOS: the patient was seen and examined on 12/28/2023 PCP: [name] LABOR, MD  Patient coming from: SNF  Chief Complaint:  Chief Complaint  Patient presents with   Fatigue   HPI: Kaitlyn Duncan is a 53 y.o. 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  She is in a long term care facility with DSS guardianship.  Patient unable to give any history.  The report is that she patient was sitting the toilet for prolonged.  Time which is not normal for her.  She was is more confused also.  She actually had a fall yesterday and hit her head.  On arrival patient was found to have a sodium of 126.  Her last sodium recorded was about 132.  She does have recurrent chronic hyponatremia. Patient denies any neurologic symptoms.  No observed injury.  At this point she is being admitted for management of altered mental status, fall versus syncope or seizure.  Review of Systems: As mentioned in the history of present illness. All other systems reviewed and are negative. Past Medical History:  Diagnosis Date   Allergy    Anemia    Gallstones    GERD (gastroesophageal reflux disease)    Hemorrhoids    melanoma with met dz dx'd 01/2018   brain and adrenal   Seasonal allergies    Stroke [facility])    Past Surgical History:  Procedure Laterality Date   BIOPSY  01/28/2018   Procedure: BIOPSY;  Surgeon: Celestia Agent, MD;  Location: [facility];  Service: Endoscopy;;   BREAST BIOPSY Left    ESOPHAGOGASTRODUODENOSCOPY N/A 01/28/2018   Procedure: ESOPHAGOGASTRODUODENOSCOPY (EGD);  Surgeon: Celestia Agent, MD;   Location: [facility];  Service: Endoscopy;  Laterality: N/A;   MOUTH SURGERY     OVARY SURGERY     removed something   Social History:  reports that she has never smoked. She has never used smokeless tobacco. She reports that she does not drink alcohol and does not use drugs.  No Known Allergies  Family History  Problem Relation Age of Onset   Melanoma Mother    Hypertension Father    Breast cancer Maternal Aunt    Breast cancer Paternal Aunt    Diabetes Paternal Aunt    Breast cancer Maternal Grandmother    Breast cancer Paternal Grandmother    Diabetes Paternal Grandmother    Colon cancer Neg Hx    Esophageal cancer Neg Hx    Pancreatic cancer Neg Hx    Stomach cancer Neg Hx    Liver disease Neg Hx    Rectal cancer Neg Hx     Prior to Admission medications   Medication Sig Start Date End Date Taking? Authorizing Provider  acetaminophen  (TYLENOL ) 325 MG tablet Take 650 mg by mouth every 6 (six) hours as needed for moderate pain (pain score 4-6).    [provider]  AMITIZA 24 MCG capsule Take 24 mcg by mouth daily. 09/10/23   [provider]  feeding supplement (ENSURE ENLIVE / ENSURE PLUS) LIQD Take 237 mLs by mouth 3 (three) times daily between meals. 11/10/23   Rizwan, Saima, MD  haloperidol  (HALDOL ) 5 MG tablet Take 1  tablet (5 mg total) by mouth every 8 (eight) hours as needed for up to 10 doses for agitation. 11/10/23   Rizwan, Saima, MD  hydrocortisone  (CORTEF ) 5 MG tablet Take 0.5 tablets (2.5 mg total) by mouth daily. 11/10/23   Rizwan, Saima, MD  hydrocortisone  (CORTEF ) 5 MG tablet Take 1.5 tablets (7.5 mg total) by mouth in the morning. 11/11/23   Rizwan, Saima, MD  hydrOXYzine  (ATARAX ) 25 MG tablet Take 25 mg by mouth 3 (three) times daily. 07/31/23   [provider]  INVEGA SUSTENNA injection Inject 78 mg into the muscle every 30 (thirty) days. 08/05/23   [provider]  lip balm (CARMEX) ointment Apply topically as needed. 11/10/23    Rizwan, Saima, MD  ondansetron  (ZOFRAN ) 4 MG tablet Take 4 mg by mouth every 8 (eight) hours as needed for vomiting or nausea. 08/22/23   [provider]  risperiDONE  (RISPERDAL ) 1 MG tablet Take 1 tablet (1 mg total) by mouth 2 (two) times daily. 11/10/23   Rizwan, Saima, MD  rosuvastatin  (CRESTOR ) 40 MG tablet Take 1 tablet (40 mg total) by mouth daily. 06/14/22   Lenor Hollering, MD  sennosides-docusate sodium  (SENOKOT-S) 8.6-50 MG tablet Take 1 tablet by mouth 2 (two) times daily.    [provider]    Physical Exam: Vitals:   12/28/23 1337 12/28/23 1629 12/28/23 1756  BP: 99/70 (!) 141/83 (!) 141/93  Pulse: 62 67 69  Resp: 16  16  Temp: 97.7 F (36.5 C)  97.8 F (36.6 C)  TempSrc: Oral  Oral  SpO2: 100% 100% 100%  Weight: 50 kg    Height: 5' (1.524 m)     Constitutional: Chronically ill looking, cachectic, pale Eyes: PERRL, lids and conjunctivae normal ENMT: Mucous membranes are dry. Posterior pharynx clear of any exudate or lesions.Normal dentition.  Neck: normal, supple, no masses, no thyromegaly Respiratory: clear to auscultation bilaterally, no wheezing, no crackles. Normal respiratory effort. No accessory muscle use.  Cardiovascular: Regular rate and rhythm, no murmurs / rubs / gallops. No extremity edema. 2+ pedal pulses. No carotid bruits.  Abdomen: no tenderness, no masses palpated. No hepatosplenomegaly. Bowel sounds positive.  Musculoskeletal: Good range of motion, no joint swelling or tenderness, Skin: no rashes, lesions, ulcers. No induration Neurologic: CN 2-12 grossly intact. Sensation intact, DTR normal. Strength 5/5 in all 4.  Psychiatric: Withdrawn, confused, unable to give adequate history  Data Reviewed:  Blood pressure 140/93 otherwise rest of the vitals appear to be within normal. Sodium 126, potassium 3.2 chloride 96.  Glucose 131.  Urinalysis essentially negative.  Urine drug screen pending.  Head CT without contrast showed no acute  intracranial abnormalities.  Assessment and Plan:  #1 hyponatremia: Appears to be acute on chronic.  Patient's sodium has been in the 130s ranging from 130-134.  She does appear to be dehydrated.  We will consider workup including serum and urine osmolality.  Also urine sodium.  More than likely patient will also has hyponatremia associated with her schizophrenia.  Also poor oral intake.  Will follow serum sodium closely.  Starting with IV saline in the morning.  #2 altered mental status with fall versus seizure with syncope: With at the moment unclear what happened with the patient.  Could be secondary to hyponatremia.  Could also be secondary to her known brain metastasis from the melanoma which could cause a seizure.  At this point we will observe the patient.  Resume home regimen.  May be a candidate for MRI of  the brain if needed.  #3 thrombocytopenia: Platelets are only 60.  Has chronic thrombocytopenia.  Will avoid anticoagulation.  #4 chronic anemia: Hemoglobin is stable at her baseline which is 11.4.  Continue to monitor.  #5 schizophrenia: Patient has aversion to treatment and has delusional states in the past.  At the moment she is stable we will continue to monitor.  #6 recurrent falls.  Patient apparently has had multiple falls in the facility.  Suspicion that she hit her head.  At the moment not ready for PT and OT but will need bed prior to discharge.  #7 history of malignant melanoma to the brain: No active treatment it appears.  Defer to her outpatient care providers.    Advance Care Planning:   Code Status: Full Code   Consults: None  Family Communication: No family at bedside.  Patient is a ward of the state.  Severity of Illness: The appropriate patient status for this patient is INPATIENT. Inpatient status is judged to be reasonable and necessary in order to provide the required intensity of service to ensure the patient's safety. The patient's presenting symptoms,  physical exam findings, and initial radiographic and laboratory data in the context of their chronic comorbidities is felt to place them at high risk for further clinical deterioration. Furthermore, it is not anticipated that the patient will be medically stable for discharge from the hospital within 2 midnights of admission.   * I certify that at the point of admission it is my clinical judgment that the patient will require inpatient hospital care spanning beyond 2 midnights from the point of admission due to high intensity of service, high risk for further deterioration and high frequency of surveillance required.*  AuthorBETHA SIM KNOLL, MD 12/28/2023 6:49 PM  For on call review [email or web address].

## 2023-12-28 NOTE — ED Provider Notes (Signed)
  Physical Exam  BP (!) 141/83   Pulse 67   Temp 97.7 F (36.5 C) (Oral)   Resp 16   Ht 5' (1.524 m)   Wt 50 kg   SpO2 100%   BMI 21.53 kg/m   Physical Exam  Procedures  Procedures  ED Course / MDM   Clinical Course as of 12/28/23 1713  Wed Dec 28, 2023  1508 AMS work up, falll yesterday from LTC. Poor historian. Staff said that she sat on the toilet longer than normal so they were concerned for ams and  pt hit her head yesterday. [AH]  1632 Sodium(!): 126 [AH]  1632 Glucose(!): 131 [AH]  1632 Platelets(!): 60 [AH]  1711 Patient appears to be on  [AH]    Clinical Course User Index [AH] Arloa Chroman, PA-C   Medical Decision Making Amount and/or Complexity of Data Reviewed Labs: ordered. Decision-making details documented in ED Course. Radiology: ordered.  Risk Decision regarding hospitalization.   Patient taken at shift handoff apparently for altered mental status.  Review of EMR actually shows that the patient has both a history of psychosis and also metastatic melanoma to the brain with apparent history of adrenal insufficiency and is on solucortef.  She has been admitted in the past for hyponatremia has also refused any further workup or interventions for her cancer but has also apparently not had the capacity to make decisions. She did undergo an MRI of the Brain last admission in May which showed no new metastasis.  She is also refused palliative care and hospice care in the past.  Patient apparently weaker than normal today.  Sodium reviewed in ED course shows a sodium of 126.  She is usually above 130.  No active vomiting. Potassium is 3.2.  Platelets at 60.  History of thrombocytopenia usually about 90 with a's slight drop.  No increased bleeding.   Patient here with likely adrenal insufficiency with hyponatremia.  Case discussed with Dr. Sim who will admit the patient for further management.    Arloa Chroman, PA-C 12/29/23 2144    Elnor Jayson LABOR,  DO 12/31/23 [phone]

## 2023-12-28 NOTE — ED Provider Notes (Signed)
 [place] EMERGENCY DEPARTMENT AT [facility] Provider Note   CSN: [id number] Arrival date & time: 12/28/23  1326     Patient presents with: No chief complaint on file.   [name] Strauser is a 53 y.o. female.   The history is provided by the patient, the EMS personnel and medical records. No language interpreter was used.     53 year old female history of schizophrenia, brain cancer, psychosis, stroke, frequent falls, brought here via EMS for evaluation of generalized fatigue.  Per EMS note, patient was sitting on the toilet at the skilled nursing facility for 45 minutes and the staff noticed that she was weaker than normal.  She has history of noncompliant with her medication.  I was unable to obtain much history from patient but she is without any complaint at this time.  She denies having any pain to her head, chest, abdomen or any new numbness or weakness or any trouble urinating.  She answered to most questions with no.  History is limited.  Prior to Admission medications   Medication Sig Start Date End Date Taking? Authorizing Provider  acetaminophen  (TYLENOL ) 325 MG tablet Take 650 mg by mouth every 6 (six) hours as needed for moderate pain (pain score 4-6).    [provider]  AMITIZA 24 MCG capsule Take 24 mcg by mouth daily. 09/10/23   [provider]  feeding supplement (ENSURE ENLIVE / ENSURE PLUS) LIQD Take 237 mLs by mouth 3 (three) times daily between meals. 11/10/23   Rizwan, Saima, MD  haloperidol  (HALDOL ) 5 MG tablet Take 1 tablet (5 mg total) by mouth every 8 (eight) hours as needed for up to 10 doses for agitation. 11/10/23   Rizwan, Saima, MD  hydrocortisone  (CORTEF ) 5 MG tablet Take 0.5 tablets (2.5 mg total) by mouth daily. 11/10/23   Rizwan, Saima, MD  hydrocortisone  (CORTEF ) 5 MG tablet Take 1.5 tablets (7.5 mg total) by mouth in the morning. 11/11/23   Rizwan, Saima, MD  hydrOXYzine  (ATARAX ) 25 MG tablet Take 25 mg by mouth 3 (three) times daily.  07/31/23   [provider]  INVEGA SUSTENNA injection Inject 78 mg into the muscle every 30 (thirty) days. 08/05/23   [provider]  lip balm (CARMEX) ointment Apply topically as needed. 11/10/23   Rizwan, Saima, MD  ondansetron  (ZOFRAN ) 4 MG tablet Take 4 mg by mouth every 8 (eight) hours as needed for vomiting or nausea. 08/22/23   [provider]  risperiDONE  (RISPERDAL ) 1 MG tablet Take 1 tablet (1 mg total) by mouth 2 (two) times daily. 11/10/23   Rizwan, Saima, MD  rosuvastatin  (CRESTOR ) 40 MG tablet Take 1 tablet (40 mg total) by mouth daily. 06/14/22   Lenor Hollering, MD  sennosides-docusate sodium  (SENOKOT-S) 8.6-50 MG tablet Take 1 tablet by mouth 2 (two) times daily.    [provider]    Allergies: Patient has no known allergies.    Review of Systems  Unable to perform ROS: Other    Updated Vital Signs BP 99/70 (BP Location: Right Arm)   Pulse 62   Temp 97.7 F (36.5 C) (Oral)   Resp 16   Ht 5' (1.524 m)   Wt 50 kg   SpO2 100%   BMI 21.53 kg/m   Physical Exam Vitals and nursing note reviewed.  Constitutional:      General: She is not in acute distress.    Appearance: She is well-developed.     Comments: This is a chronically ill  and pale appearing female laying bed appears to be in no acute discomfort.  HENT:     Head: Atraumatic.     Mouth/Throat:     Mouth: Mucous membranes are dry.   Eyes:     Conjunctiva/sclera: Conjunctivae normal.    Cardiovascular:     Rate and Rhythm: Normal rate and regular rhythm.     Pulses: Normal pulses.     Heart sounds: Normal heart sounds.  Pulmonary:     Effort: Pulmonary effort is normal.  Abdominal:     Palpations: Abdomen is soft.     Tenderness: There is no abdominal tenderness.   Musculoskeletal:     Cervical back: Normal range of motion and neck supple.     Comments: Able to move all 4 extremities with poor effort.   Skin:    Findings: No rash.   Neurological:     Mental  Status: She is alert. Mental status is at baseline.     Motor: Weakness present.   Psychiatric:        Mood and Affect: Mood normal.     (all labs ordered are listed, but only abnormal results are displayed) Labs Reviewed  CBC - Abnormal; Notable for the following components:      Result Value   Hemoglobin 11.4 (*)    HCT 34.5 (*)    Platelets 60 (*)    All other components within normal limits  COMPREHENSIVE METABOLIC PANEL WITH GFR  URINALYSIS, ROUTINE W REFLEX MICROSCOPIC  TSH  CBG MONITORING, ED    EKG: None  Radiology: No results found.   Procedures   Medications Ordered in the ED  sodium chloride  0.9 % bolus 1,000 mL (1,000 mLs Intravenous New Bag/Given 12/28/23 1415)    Clinical Course as of 12/28/23 1515  Wed Dec 28, 2023  1508 AMS work up, falll yesterday from LTC. Poor historian. Staff said that she sat on the toilet longer than normal so they were concerned for ams and  pt hit her head yesterday. [AH]    Clinical Course User Index [AH] Arloa Chroman, PA-C                                 Medical Decision Making Amount and/or Complexity of Data Reviewed Labs: ordered. Radiology: ordered.   BP 99/70 (BP Location: Right Arm)   Pulse 62   Temp 97.7 F (36.5 C) (Oral)   Resp 16   Ht 5' (1.524 m)   Wt 50 kg   SpO2 100%   BMI 21.53 kg/m   30:20 PM   53 year old female history of schizophrenia, brain cancer, psychosis, stroke, frequent falls, brought here via EMS for evaluation of generalized fatigue.  Per EMS note, patient was sitting on the toilet at the skilled nursing facility for 45 minutes and the staff noticed that she was weaker than normal.  She has history of noncompliant with her medication.  I was unable to obtain much history from patient but she is without any complaint at this time.  She denies having any pain to her head, chest, abdomen or any new numbness or weakness or any trouble urinating.  She answered to most questions with  no.  History is limited.  On exam this is a pale appearing chronically ill female laying in bed in no acute discomfort.  Her mouth is dry.  She does not have any reproducible tenderness.  She is able  to move all 4 extremities with very poor effort.  She is unable to fully engage in normal conversation and appears to be disinterested.  I have reached out to nursing staff at patient's facility who report patient was found on the floor yesterday calling for help.  She had apparently fallen off her bed but it was unwitnessed.  She was not on the ground any extended period of time.  She refused to come to the ER for head CT scan but throughout the day today she appears to be more altered than usual.  Vital signs notable for blood pressure of 99/70.  She is afebrile no hypoxia no tachycardia.  -Labs ordered, independently viewed and interpreted by me.  Labs remarkable for platelets of 60 apparently it is somewhat similar to her baseline.  Remainder of her labs are currently pending. -The patient was maintained on a cardiac monitor.  I personally viewed and interpreted the cardiac monitored which showed an underlying rhythm of: NSR -Imaging including head CT currently pending -This patient presents to the ED for concern of confusion, this involves an extensive number of treatment options, and is a complaint that carries with it a high risk of complications and morbidity.  The differential diagnosis includes head trauma, delirium, anemia, electrolytes derangement, depression -Co morbidities that complicate the patient evaluation includes schizophrenia, brain CA, psychosis, stroke, frequent falls -Treatment includes IVF -Reevaluation of the patient after these medicines showed that the patient stayed the same -PCP office notes or outside notes reviewed -Discussion with oncoming team who will f/u on labs and imaging and determine disposition -Escalation to admission/observation considered: dispo  pending     Final diagnoses:  None    ED Discharge Orders     None          Nivia Colon, PA-C 12/28/23 1525    Ula Prentice SAUNDERS, MD 12/28/23 1536

## 2023-12-28 NOTE — ED Notes (Signed)
 Please update RN Jereld, called from [facility] [phone]

## 2023-12-29 DIAGNOSIS — R296 Repeated falls: Secondary | ICD-10-CM | POA: Diagnosis not present

## 2023-12-29 DIAGNOSIS — E871 Hypo-osmolality and hyponatremia: Secondary | ICD-10-CM | POA: Diagnosis not present

## 2023-12-29 DIAGNOSIS — E876 Hypokalemia: Secondary | ICD-10-CM | POA: Diagnosis not present

## 2023-12-29 LAB — COMPREHENSIVE METABOLIC PANEL WITH GFR
ALT: 14 U/L (ref 0–44)
AST: 26 U/L (ref 15–41)
Albumin: 3.5 g/dL (ref 3.5–5.0)
Alkaline Phosphatase: 67 U/L (ref 38–126)
Anion gap: 8 (ref 5–15)
BUN: 11 mg/dL (ref 6–20)
CO2: 18 mmol/L — ABNORMAL LOW (ref 22–32)
Calcium: 8.5 mg/dL — ABNORMAL LOW (ref 8.9–10.3)
Chloride: 107 mmol/L (ref 98–111)
Creatinine, Ser: 0.5 mg/dL (ref 0.44–1.00)
GFR, Estimated: >60 mL/min (ref >60)
Glucose, Bld: 87 mg/dL (ref 70–99)
Potassium: 3.7 mmol/L (ref 3.5–5.1)
Sodium: 133 mmol/L — ABNORMAL LOW (ref 135–145)
Total Bilirubin: 0.9 mg/dL (ref 0.0–1.2)
Total Protein: 6.1 g/dL — ABNORMAL LOW (ref 6.5–8.1)

## 2023-12-29 LAB — CBC
HCT: 32.5 % — ABNORMAL LOW (ref 36.0–46.0)
Hemoglobin: 11.1 g/dL — ABNORMAL LOW (ref 12.0–15.0)
MCH: 27.8 pg (ref 26.0–34.0)
MCHC: 34.2 g/dL (ref 30.0–36.0)
MCV: 81.5 fL (ref 80.0–100.0)
Platelets: 71 10*3/uL — ABNORMAL LOW (ref 150–400)
RBC: 3.99 MIL/uL (ref 3.87–5.11)
RDW: 12.7 % (ref 11.5–15.5)
WBC: 3.5 10*3/uL — ABNORMAL LOW (ref 4.0–10.5)
nRBC: 0 % (ref 0.0–0.2)

## 2023-12-29 MED ORDER — HYDROCORTISONE 5 MG PO TABS
7.5000 mg | ORAL_TABLET | Freq: Every day | ORAL | Status: AC
Start: 2023-12-29 — End: ?

## 2023-12-29 NOTE — Progress Notes (Signed)
 Attempted to call report to [facility]. Was left on hold for 10 min, will attempt report again prior to transport.

## 2023-12-29 NOTE — TOC Transition Note (Signed)
 Transition of Care [facility]) - Discharge Note   Patient Details  Name: Kaitlyn Duncan MRN: [id number] Date of Birth: 01/26/1971  [facility]) CM/SW Contact:  Luann SHAUNNA Cumming, LCSW Phone Number: 12/29/2023, 12:20 PM   Clinical Narrative:     Pts DSS legal guardian phone does not have voicemail set up. CSW called DSS and informed them of pt's DC.   Per MD patient ready for DC to [facility]. RN, patient,  and facility notified of DC. Discharge Summary and FL2 sent to facility. RN to call report prior to discharge ( [phone] ). DC packet on chart. Ambulance transport requested for patient.   CSW will sign off for now as social work intervention is no longer needed. Please consult us  again if new needs arise.   Final next level of care: Skilled Nursing Facility Barriers to Discharge: No Barriers Identified   Discharge Plan and Services Additional resources added to the After Visit Summary for                                       Social Drivers of Health (SDOH) Interventions SDOH Screenings   Food Insecurity: Food Insecurity Present (08/04/2022)  Housing: High Risk (04/09/2022)  Transportation Needs: Unmet Transportation Needs (08/04/2022)  Utilities: Not At Risk (08/04/2022)  Alcohol Screen: Low Risk  (06/27/2021)  Depression (PHQ2-9): Low Risk  (07/21/2022)  Tobacco Use: Low Risk  (12/28/2023)     Readmission Risk Interventions    11/10/2023    2:57 PM 08/04/2022    3:35 PM  Readmission Risk Prevention Plan  Transportation Screening Complete Complete  PCP or Specialist Appt within 3-5 Days Complete Complete  HRI or Home Care Consult Complete Complete  Social Work Consult for Recovery Care Planning/Counseling Complete Complete  Palliative Care Screening Complete Complete  Medication Review Oceanographer) Referral to Pharmacy Complete

## 2023-12-29 NOTE — Plan of Care (Signed)
    Ability to remain free from injury will improve Progressing Not Progressing Adequate for Discharge Completed/Met Not Applicable Not Met (add Reason) Interventions Assess risk factors for falls Provide a safe environment Provide venous thromboembolism (VTE) prophylaxis

## 2023-12-29 NOTE — Progress Notes (Addendum)
 Pt. Refused to talk and answer any of assessment questions by Stated she is sick and tiered of all this.

## 2023-12-29 NOTE — Progress Notes (Signed)
Report called to [facility]

## 2023-12-29 NOTE — Discharge Summary (Signed)
 Physician Discharge Summary   Patient: Kaitlyn Duncan MRN: [id number] DOB: 1971/05/20  Admit date:     12/28/2023  Discharge date: 12/29/23  Discharge Physician: Carliss LELON Canales   PCP: [name] LABOR, MD   Recommendations at discharge:    Pt to be discharged home to [facility] .   If you experience worsening fever, chills, chest pain, shortness of breath, or other concerning symptoms, please call your PCP or go to the emergency department immediately.  Discharge Diagnoses: Principal Problem:   Hyponatremia Active Problems:   h/o Malignant neoplasm metastatic to brain [facility])   Schizophrenia spectrum disorder with psychotic disorder type not yet determined (HCC)   Leukopenia   Thrombocytopenia (HCC)   Frequent falls   Hypokalemia  Resolved Problems:   * No resolved hospital problems. *   Hospital Course:  53 y.o. 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  She is in a long term care facility with DSS guardianship.  Patient unable to give any history.  The report is that she patient was sitting the toilet for prolonged.  Time which is not normal for her.  She was is more confused also.  She actually had a fall yesterday and hit her head.  On arrival patient was found to have a sodium of 126.  Her last sodium recorded was about 132.  She does have recurrent chronic hyponatremia. Patient denies any neurologic symptoms.  No observed injury.  At this point she is being admitted for management of altered mental status, fall versus syncope or seizure.   Assessment and Plan:  Mild hyponatremia - Likely secondary to poor solute intake.  Resolved after IV fluid hydration.  Serum sodium  133 this morning.  Acute metabolic encephalopathy - Etiology unclear.  Syncope with seizure-like activity possibly from hypovolemia.  Multiple underlying etiologies including known brain metastases from melanoma.  No seizure-like activity observed overnight.  Responded well to IV fluid hydration.  Patient alert, eating breakfast.  Able to be discharged back to SNF.  Chronic anemia/thrombocytopenia - Appears to be at baseline.  Schizophrenia - Appears stable at this time.  Recurrent falls - CT head noting no acute abnormalities.  History of malignant melanoma with metastasis to brain - Noted.  Deferred to outpatient care providers.    Consultants: None Procedures performed: None Disposition: Skilled nursing facility Diet recommendation:  Discharge Diet Orders (From admission, onward)     Start     Ordered   12/29/23 0000  Diet - low sodium heart healthy        12/29/23 1054           Cardiac diet  DISCHARGE MEDICATION: Allergies as of 12/29/2023   No Known Allergies      Medication List     STOP taking these medications    Amitiza 24 MCG capsule Generic drug: lubiprostone   feeding supplement Liqd   Invega Sustenna injection Generic drug: Paliperidone ER   ondansetron  4 MG tablet Commonly known as: ZOFRAN    risperiDONE  1 MG tablet Commonly known as: RISPERDAL    rosuvastatin  40 MG tablet Commonly known as: CRESTOR    sennosides-docusate sodium  8.6-50 MG tablet Commonly known as: SENOKOT-S   sodium chloride  1 g tablet  TAKE these medications    acetaminophen  325 MG tablet Commonly known as: TYLENOL  Take 650 mg by mouth every 6 (six) hours as needed for moderate pain (pain score 4-6).   hydrocortisone  5 MG tablet Commonly known as: CORTEF  Take 1.5 tablets (7.5 mg total) by mouth daily. What changed:  when to take this Another medication with the same name was removed. Continue taking this medication, and follow the directions you see  here.         Discharge Exam: Filed Weights   12/28/23 1337  Weight: 50 kg    GENERAL:  Alert, pleasant, frail HEENT:  EOMI CARDIOVASCULAR:  RRR, no murmurs appreciated RESPIRATORY:  Clear to auscultation, no wheezing, rales, or rhonchi GASTROINTESTINAL:  Soft, nontender, nondistended EXTREMITIES: Moving extremities NEURO:  No new focal deficits appreciated SKIN:  No rashes noted PSYCH:  Appropriate mood and affect     Condition at discharge: improving  The results of significant diagnostics from this hospitalization (including imaging, microbiology, ancillary and laboratory) are listed below for reference.   Imaging Studies: CT Head Wo Contrast Result Date: 12/28/2023 CLINICAL DATA:  Head trauma, altered mental status (Ped 0-17y) EXAM: CT HEAD WITHOUT CONTRAST TECHNIQUE: Contiguous axial images were obtained from the base of the skull through the vertex without intravenous contrast. RADIATION DOSE REDUCTION: This exam was performed according to the departmental dose-optimization program which includes automated exposure control, adjustment of the mA and/or kV according to patient size and/or use of iterative reconstruction technique. COMPARISON:  CT head September 04, 2023 FINDINGS: Brain: No evidence of acute infarction, hemorrhage, hydrocephalus, extra-axial collection or mass lesion/mass effect. Moderate patchy white matter hypodensities, compatible with chronic microvascular ischemic disease. Vascular: No hyperdense vessel. Skull: No acute fracture. Sinuses/Orbits: Left maxillary sinus mucosal thickening. Remaining sinuses are clear. No acute orbital findings. Other: No mastoid effusions. IMPRESSION: No evidence of acute intracranial abnormality. Electronically Signed   By: Gilmore GORMAN Molt M.D.   On: 12/28/2023 16:11    Microbiology: Results for orders placed or performed during the hospital encounter of 06/15/22  Resp panel by RT-PCR (RSV, Flu A&B, Covid) Anterior Nasal Swab      Status: None   Collection Time: 06/16/22  3:42 PM   Specimen: Anterior Nasal Swab  Result Value Ref Range Status   SARS Coronavirus 2 by RT PCR NEGATIVE NEGATIVE Final    Comment: (NOTE) SARS-CoV-2 target nucleic acids are NOT DETECTED.  The SARS-CoV-2 RNA is generally detectable in upper respiratory specimens during the acute phase of infection. The lowest concentration of SARS-CoV-2 viral copies this assay can detect is 138 copies/mL. A negative result does not preclude SARS-Cov-2 infection and should not be used as the sole basis for treatment or other patient management decisions. A negative result may occur with  improper specimen collection/handling, submission of specimen other than nasopharyngeal swab, presence of viral mutation(s) within the areas targeted by this assay, and inadequate number of viral copies(<138 copies/mL). A negative result must be combined with clinical observations, patient history, and epidemiological information. The expected result is Negative.  Fact Sheet for Patients:  BloggerCourse.com  Fact Sheet for Healthcare Providers:  SeriousBroker.it  This test is no t yet approved or cleared by the [place]  FDA and  has been authorized for detection and/or diagnosis of SARS-CoV-2 by FDA under an Emergency Use Authorization (EUA). This EUA will remain  in effect (meaning this test can be used) for the duration of the COVID-19 declaration under Section 564(b)(1) of the Act, 21 U.S.C.section  360bbb-3(b)(1), unless the authorization is terminated  or revoked sooner.       Influenza A by PCR NEGATIVE NEGATIVE Final   Influenza B by PCR NEGATIVE NEGATIVE Final    Comment: (NOTE) The Xpert Xpress SARS-CoV-2/FLU/RSV plus assay is intended as an aid in the diagnosis of influenza from Nasopharyngeal swab specimens and should not be used as a sole basis for treatment. Nasal washings and aspirates are  unacceptable for Xpert Xpress SARS-CoV-2/FLU/RSV testing.  Fact Sheet for Patients: BloggerCourse.com  Fact Sheet for Healthcare Providers: SeriousBroker.it  This test is not yet approved or cleared by the [place]  FDA and has been authorized for detection and/or diagnosis of SARS-CoV-2 by FDA under an Emergency Use Authorization (EUA). This EUA will remain in effect (meaning this test can be used) for the duration of the COVID-19 declaration under Section 564(b)(1) of the Act, 21 U.S.C. section 360bbb-3(b)(1), unless the authorization is terminated or revoked.     Resp Syncytial Virus by PCR NEGATIVE NEGATIVE Final    Comment: (NOTE) Fact Sheet for Patients: BloggerCourse.com  Fact Sheet for Healthcare Providers: SeriousBroker.it  This test is not yet approved or cleared by the [place]  FDA and has been authorized for detection and/or diagnosis of SARS-CoV-2 by FDA under an Emergency Use Authorization (EUA). This EUA will remain in effect (meaning this test can be used) for the duration of the COVID-19 declaration under Section 564(b)(1) of the Act, 21 U.S.C. section 360bbb-3(b)(1), unless the authorization is terminated or revoked.  Performed at [facility], [address] [place], [place] [address]     Labs: CBC: Recent Labs  Lab 12/28/23 1343 12/29/23 0531  WBC 4.2 3.5*  HGB 11.4* 11.1*  HCT 34.5* 32.5*  MCV 81.8 81.5  PLT 60* 71*   Basic Metabolic Panel: Recent Labs  Lab 12/28/23 1343 12/29/23 0531  NA 126* 133*  K 3.2* 3.7  CL 96* 107  CO2 24 18*  GLUCOSE 131* 87  BUN 15 11  CREATININE 0.68 0.50  CALCIUM  8.6* 8.5*   Liver Function Tests: Recent Labs  Lab 12/28/23 1343 12/29/23 0531  AST 30 26  ALT 17 14  ALKPHOS 70 67  BILITOT 0.5 0.9  PROT 6.7 6.1*  ALBUMIN 3.9 3.5   CBG: Recent Labs  Lab 12/28/23 1413   GLUCAP 70    Discharge time spent: 25 minutes.  Length of inpatient stay: 1 days  Signed: Carliss LELON Canales, DO [facility] 12/29/2023

## 2023-12-29 NOTE — NC FL2 (Signed)
 [place]  MEDICAID FL2 LEVEL OF CARE FORM     IDENTIFICATION  Patient Name: Kaitlyn Duncan Birthdate: Oct 05, 1970 Sex: female Admission Date (Current Location): 12/28/2023  [facility] and [place] Number:  Producer, television/film/video and Address:  [facility], [address] [place], [place] [address]      Provider Number: [id number]  Attending Physician Name and Address:  Arlon Carliss ORN, DO  Relative Name and Phone Number:  Sebastian Price (Legal Guardian)  [phone] (Work Phone)    Current Level of Care: Hospital Recommended Level of Care: Skilled Nursing Facility Prior Approval Number:    Date Approved/Denied:   PASRR Number: [id number]  Discharge Plan:      Current Diagnoses: Patient Active Problem List   Diagnosis Date Noted   Hyponatremia 12/28/2023   Hypokalemia 12/28/2023   Acute hyponatremia 11/02/2023   UTI (urinary tract infection) 08/04/2022   Falls 08/03/2022   Constipation 07/23/2022   Involuntary commitment 06/07/2022   Frequent falls 04/09/2022   Allergic rhinitis 04/09/2022   Hx of completed stroke 04/08/2022   Prediabetes 12/24/2021   Frequent urination 12/24/2021   Undifferentiated schizophrenia (HCC) 09/25/2021   Leukopenia 06/27/2021   Thrombocytopenia (HCC) 06/27/2021   Schizophrenia spectrum disorder with psychotic disorder type not yet determined (HCC) 06/26/2021   Has difficulty accessing primary care provider for most visits 06/15/2021   Candidiasis of skin 01/23/2021   Musculoskeletal pain 01/23/2021   Psychosis (HCC)    Melanoma of skin (HCC) 05/29/2018   Goals of care, counseling/discussion 05/29/2018   h/o Malignant neoplasm metastatic to brain (HCC) 01/26/2018    Orientation RESPIRATION BLADDER Height & Weight     Self, Place  Normal Continent Weight: 110 lb 3.7 oz (50 kg) Height:  5' (152.4 cm)  BEHAVIORAL SYMPTOMS/MOOD NEUROLOGICAL BOWEL NUTRITION STATUS      Continent Diet (see d/c summary)   AMBULATORY STATUS COMMUNICATION OF NEEDS Skin   Limited Assist Verbally Normal                       Personal Care Assistance Level of Assistance  Bathing, Feeding, Dressing Bathing Assistance: Limited assistance Feeding assistance: Independent Dressing Assistance: Limited assistance     Functional Limitations Info  Sight, Hearing, Speech Sight Info: Adequate Hearing Info: Adequate Speech Info: Adequate    SPECIAL CARE FACTORS FREQUENCY  OT (By licensed OT), PT (By licensed PT)     PT Frequency: 5x/week OT Frequency: 5x/week            Contractures Contractures Info: Not present    Additional Factors Info  Code Status, Allergies Code Status Info: full code Allergies Info: no known allergies           Current Medications (12/29/2023):  This is the current hospital active medication list Current Facility-Administered Medications  Medication Dose Route Frequency Provider Last Rate Last Admin   0.9 %  sodium chloride  infusion   Intravenous Continuous Sim Emery CROME, MD 100 mL/hr at 12/29/23 0911 New Bag at 12/29/23 0911   acetaminophen  (TYLENOL ) tablet 650 mg  650 mg Oral Q6H PRN Garba, Mohammad L, MD   650 mg at 12/28/23 2221   feeding supplement (ENSURE ENLIVE / ENSURE PLUS) liquid 237 mL  237 mL Oral TID BM Sim Emery L, MD   237 mL at 12/29/23 0912   haloperidol  (HALDOL ) tablet 5 mg  5 mg Oral Q8H PRN Sim Emery CROME, MD       hydrocortisone  (CORTEF ) tablet  2.5 mg  2.5 mg Oral Daily Sim Re L, MD   2.5 mg at 12/29/23 0915   hydrocortisone  (CORTEF ) tablet 7.5 mg  7.5 mg Oral Daily Garba, Mohammad L, MD   7.5 mg at 12/28/23 2355   hydrOXYzine  (ATARAX ) tablet 25 mg  25 mg Oral TID Garba, Mohammad L, MD   25 mg at 12/29/23 0911   lubiprostone (AMITIZA) capsule 24 mcg  24 mcg Oral Daily Garba, Mohammad L, MD   24 mcg at 12/29/23 9083   ondansetron  (ZOFRAN ) tablet 4 mg  4 mg Oral Q6H PRN Sim Re CROME, MD       Or   ondansetron  (ZOFRAN )  injection 4 mg  4 mg Intravenous Q6H PRN Sim Re CROME, MD       risperiDONE  (RISPERDAL ) tablet 1 mg  1 mg Oral BID Garba, Mohammad L, MD   1 mg at 12/29/23 0915   rosuvastatin  (CRESTOR ) tablet 40 mg  40 mg Oral Daily Sim Re CROME, MD   40 mg at 12/29/23 0911   senna-docusate (Senokot-S) tablet 1 tablet  1 tablet Oral BID Sim Re CROME, MD   1 tablet at 12/29/23 0911     Discharge Medications: Please see discharge summary for a list of discharge medications.  Relevant Imaging Results:  Relevant Lab Results:   Additional Information SS# [id number]  Kaitlyn SHAUNNA Cumming, LCSW

## 2024-01-03 ENCOUNTER — Ambulatory Visit: Admitting: Dermatology

## 2024-01-08 ENCOUNTER — Emergency Department (HOSPITAL_COMMUNITY): Payer: Medicare (Managed Care)

## 2024-01-08 ENCOUNTER — Other Ambulatory Visit: Payer: Self-pay

## 2024-01-08 ENCOUNTER — Emergency Department (HOSPITAL_COMMUNITY)
Admission: EM | Admit: 2024-01-08 | Discharge: 2024-01-08 | Disposition: A | Discharge status: Skilled Nursing Facility | Payer: Medicare (Managed Care) | Attending: Emergency Medicine | Admitting: Emergency Medicine

## 2024-01-08 DIAGNOSIS — Z85858 Personal history of malignant neoplasm of other endocrine glands: Secondary | ICD-10-CM | POA: Insufficient documentation

## 2024-01-08 DIAGNOSIS — E871 Hypo-osmolality and hyponatremia: Secondary | ICD-10-CM | POA: Insufficient documentation

## 2024-01-08 DIAGNOSIS — Z85841 Personal history of malignant neoplasm of brain: Secondary | ICD-10-CM | POA: Insufficient documentation

## 2024-01-08 DIAGNOSIS — W06XXXA Fall from bed, initial encounter: Secondary | ICD-10-CM | POA: Insufficient documentation

## 2024-01-08 DIAGNOSIS — S0083XA Contusion of other part of head, initial encounter: Secondary | ICD-10-CM | POA: Insufficient documentation

## 2024-01-08 DIAGNOSIS — D72819 Decreased white blood cell count, unspecified: Secondary | ICD-10-CM | POA: Insufficient documentation

## 2024-01-08 DIAGNOSIS — E876 Hypokalemia: Secondary | ICD-10-CM | POA: Diagnosis not present

## 2024-01-08 DIAGNOSIS — S0990XA Unspecified injury of head, initial encounter: Secondary | ICD-10-CM

## 2024-01-08 LAB — CBC WITH DIFFERENTIAL/PLATELET
Abs Immature Granulocytes: 0.01 K/uL (ref 0.00–0.07)
Basophils Absolute: 0 K/uL (ref 0.0–0.1)
Basophils Relative: 0 %
Eosinophils Absolute: 0 K/uL (ref 0.0–0.5)
Eosinophils Relative: 0 %
HCT: 34.1 % — ABNORMAL LOW (ref 36.0–46.0)
Hemoglobin: 11.2 g/dL — ABNORMAL LOW (ref 12.0–15.0)
Immature Granulocytes: 0 %
Lymphocytes Relative: 30 %
Lymphs Abs: 1.2 K/uL (ref 0.7–4.0)
MCH: 27.3 pg (ref 26.0–34.0)
MCHC: 32.8 g/dL (ref 30.0–36.0)
MCV: 83.2 fL (ref 80.0–100.0)
Monocytes Absolute: 0.5 K/uL (ref 0.1–1.0)
Monocytes Relative: 13 %
Neutro Abs: 2.2 K/uL (ref 1.7–7.7)
Neutrophils Relative %: 57 %
Platelets: 63 K/uL — ABNORMAL LOW (ref 150–400)
RBC: 4.1 MIL/uL (ref 3.87–5.11)
RDW: 12.5 % (ref 11.5–15.5)
WBC: 3.9 K/uL — ABNORMAL LOW (ref 4.0–10.5)
nRBC: 0 % (ref 0.0–0.2)

## 2024-01-08 LAB — BASIC METABOLIC PANEL WITH GFR
Anion gap: 10 (ref 5–15)
BUN: 17 mg/dL (ref 6–20)
CO2: 22 mmol/L (ref 22–32)
Calcium: 8.7 mg/dL — ABNORMAL LOW (ref 8.9–10.3)
Chloride: 95 mmol/L — ABNORMAL LOW (ref 98–111)
Creatinine, Ser: 0.74 mg/dL (ref 0.44–1.00)
GFR, Estimated: >60 mL/min (ref >60)
Glucose, Bld: 77 mg/dL (ref 70–99)
Potassium: 3.4 mmol/L — ABNORMAL LOW (ref 3.5–5.1)
Sodium: 127 mmol/L — ABNORMAL LOW (ref 135–145)

## 2024-01-08 NOTE — ED Notes (Signed)
 Discharge instructions reviewed with patient. Patient questions answered and opportunity for education reviewed. Patient voices understanding of discharge instructions with no further question. Attempted to call report to facility 3 times with no answer.

## 2024-01-08 NOTE — ED Provider Notes (Signed)
 [place] EMERGENCY DEPARTMENT AT [facility] Provider Note  CSN: [id number] Arrival date & time: 01/08/24 1436  Chief Complaint(s) Fall  HPI Kaitlyn Duncan is a 53 y.o. female with history of metastatic melanoma to the brain, schizophrenia, here today after she was leaning to get something out of bed, fell and struck the left side of her face.  She is not on blood thinners.  No loss of consciousness.  Report confirmed with EMS, patient corroborates story.   Past Medical History Past Medical History:  Diagnosis Date   Allergy    Anemia    Gallstones    GERD (gastroesophageal reflux disease)    Hemorrhoids    melanoma with met dz dx'd 01/2018   brain and adrenal   Seasonal allergies    Stroke [facility])    Patient Active Problem List   Diagnosis Date Noted   Hyponatremia 12/28/2023   Hypokalemia 12/28/2023   Acute hyponatremia 11/02/2023   UTI (urinary tract infection) 08/04/2022   Falls 08/03/2022   Constipation 07/23/2022   Involuntary commitment 06/07/2022   Frequent falls 04/09/2022   Allergic rhinitis 04/09/2022   Hx of completed stroke 04/08/2022   Prediabetes 12/24/2021   Frequent urination 12/24/2021   Undifferentiated schizophrenia (HCC) 09/25/2021   Leukopenia 06/27/2021   Thrombocytopenia (HCC) 06/27/2021   Schizophrenia spectrum disorder with psychotic disorder type not yet determined (HCC) 06/26/2021   Has difficulty accessing primary care provider for most visits 06/15/2021   Candidiasis of skin 01/23/2021   Musculoskeletal pain 01/23/2021   Psychosis (HCC)    Melanoma of skin (HCC) 05/29/2018   Goals of care, counseling/discussion 05/29/2018   h/o Malignant neoplasm metastatic to brain (HCC) 01/26/2018   Home Medication(s) Prior to Admission medications   Medication Sig Start Date End Date Taking? Authorizing Provider  acetaminophen  (TYLENOL ) 325 MG tablet Take 650 mg by mouth every 6 (six) hours as needed for moderate pain (pain score  4-6).    [provider]  hydrocortisone  (CORTEF ) 5 MG tablet Take 1.5 tablets (7.5 mg total) by mouth daily. 12/29/23   Arlon Carliss ORN, DO                                                                                                                                    Past Surgical History Past Surgical History:  Procedure Laterality Date   BIOPSY  01/28/2018   Procedure: BIOPSY;  Surgeon: Celestia Agent, MD;  Location: [facility];  Service: Endoscopy;;   BREAST BIOPSY Left    ESOPHAGOGASTRODUODENOSCOPY N/A 01/28/2018   Procedure: ESOPHAGOGASTRODUODENOSCOPY (EGD);  Surgeon: Celestia Agent, MD;  Location: [facility];  Service: Endoscopy;  Laterality: N/A;   MOUTH SURGERY     OVARY SURGERY     removed something   Family History Family History  Problem Relation Age of Onset   Melanoma Mother    Hypertension Father    Breast cancer Maternal Aunt  Breast cancer Paternal Aunt    Diabetes Paternal Aunt    Breast cancer Maternal Grandmother    Breast cancer Paternal Grandmother    Diabetes Paternal Grandmother    Colon cancer Neg Hx    Esophageal cancer Neg Hx    Pancreatic cancer Neg Hx    Stomach cancer Neg Hx    Liver disease Neg Hx    Rectal cancer Neg Hx     Social History Social History   Tobacco Use   Smoking status: Never   Smokeless tobacco: Never  Vaping Use   Vaping status: Never Used  Substance Use Topics   Alcohol use: No   Drug use: No   Allergies Patient has no known allergies.  Review of Systems Review of Systems  Physical Exam Vital Signs  I have reviewed the triage vital signs BP 115/76 (BP Location: Left Arm)   Pulse 66   Temp 98.2 F (36.8 C) (Oral)   Resp 18   SpO2 98%   Physical Exam Vitals reviewed.  Constitutional:      Appearance: She is not toxic-appearing.  HENT:     Head: Normocephalic.     Comments: Bruising over the left side of the face.  No laceration.    Nose: Nose normal.  Eyes:     Extraocular  Movements: Extraocular movements intact.     Pupils: Pupils are equal, round, and reactive to light.  Neck:     Comments: Patient with no midline cervical spine tenderness, no pain with turning the head to the right, left, flexing or extending neck. Cardiovascular:     Rate and Rhythm: Normal rate.  Abdominal:     General: Abdomen is flat. There is no distension.     Palpations: Abdomen is soft.     Tenderness: There is no abdominal tenderness.  Musculoskeletal:     Cervical back: Normal range of motion.     Comments: No tenderness to palpation in the bilateral shoulders, upper arms, elbows, forearms or wrists.  No tenderness to palpation in the chest.  Pelvis stable, nontender.  No tenderness, deformities noted on bilateral upper legs, knees, lower legs or ankles.  Patient able to lift both legs from the bed.  Neurological:     General: No focal deficit present.     Mental Status: She is alert and oriented to person, place, and time.     Comments: 5 of 5 grip strength bilaterally.  No arm numbness, weakness or tingling.  Psychiatric:        Mood and Affect: Mood normal.     ED Results and Treatments Labs (all labs ordered are listed, but only abnormal results are displayed) Labs Reviewed  BASIC METABOLIC PANEL WITH GFR - Abnormal; Notable for the following components:      Result Value   Sodium 127 (*)    Potassium 3.4 (*)    Chloride 95 (*)    Calcium  8.7 (*)    All other components within normal limits  CBC WITH DIFFERENTIAL/PLATELET - Abnormal; Notable for the following components:   WBC 3.9 (*)    Hemoglobin 11.2 (*)    HCT 34.1 (*)    Platelets 63 (*)    All other components within normal limits  Radiology DG Chest Portable 1 View Result Date: 01/08/2024 CLINICAL DATA:  Cough Pt had unwitnessed fall out of bed. Small laceration to face/bruising to  cheek, no other injuries. EXAM: PORTABLE CHEST 1 VIEW COMPARISON:  Chest x-ray 11/11/2022 FINDINGS: The heart and mediastinal contours are within normal limits. Low lung volumes. No focal consolidation. No pulmonary edema. No pleural effusion. No pneumothorax. No acute osseous abnormality. IMPRESSION: Low lung volumes.  No active disease. Electronically Signed   By: Morgane  Naveau M.D.   On: 01/08/2024 17:11   CT Maxillofacial Wo Contrast Result Date: 01/08/2024 CLINICAL DATA:  Head trauma, unwitnessed fall out of bed, small laceration to face and bruising to cheek. EXAM: CT HEAD WITHOUT CONTRAST CT MAXILLOFACIAL WITHOUT CONTRAST CT CERVICAL SPINE WITHOUT CONTRAST TECHNIQUE: Multidetector CT imaging of the head, cervical spine, and maxillofacial structures were performed using the standard protocol without intravenous contrast. Multiplanar CT image reconstructions of the cervical spine and maxillofacial structures were also generated. RADIATION DOSE REDUCTION: This exam was performed according to the departmental dose-optimization program which includes automated exposure control, adjustment of the mA and/or kV according to patient size and/or use of iterative reconstruction technique. COMPARISON:  CT head 12/28/2023, CT cervical spine 09/04/2023. CT maxillofacial 10/16/2021. FINDINGS: CT HEAD FINDINGS Brain: No acute intracranial hemorrhage. No CT evidence of acute infarct. Nonspecific hypoattenuation in the periventricular and subcortical white matter favored to reflect chronic microvascular ischemic changes. No edema, mass effect, or midline shift. The basilar cisterns are patent. Ventricles: The ventricles are normal. Vascular: No hyperdense vessel or unexpected calcification. Skull: No acute or aggressive finding. Other: Trace fluid in the right mastoid air cells. CT MAXILLOFACIAL FINDINGS Osseous: Maxilla: Intact Pterygoid Plates: Intact Zygomatic Arch: Intact Orbits: Intact Ethmoid: Intact Sphenoid: Intact  Frontal:Intact Mandible: Intact. No condylar dislocation. Flattening and irregularity of the mandibular condyle with narrowing of the temporomandibular joint space compatible with osteoarthrosis. Nasal: Chronic irregularity of the nasal bones without evidence of acute fracture. Nasal Septum: Intact.  Leftward bowing of the nasal septum. Orbits: Globes are intact. Lenses are normally located. Extraocular muscles and optic nerve sheath complexes are unremarkable. Normal appearance of the intraorbital fat. There is swelling of the periorbital soft tissues particularly along the inferior medial aspects of the left orbit with involvement of the preseptal soft tissues and swelling of the eyelids more pronounced along the lower eyelid. Sinuses: Mucosal thickening throughout the left maxillary sinus with thickening of the sinus walls suggestive of mucoperiosteal reaction. No air-fluid levels. Soft tissues: Left periorbital soft tissue swelling with swelling also extending over the left nasal bone and extending inferiorly into the left facial soft tissues over the maxillary sinus. CT CERVICAL SPINE FINDINGS Alignment: Straightening of the normal cervical lordosis. No significant listhesis. No facet subluxation or dislocation. Asymmetric spacing between the C4 and C5 spinous processes is unchanged from multiple prior exams. Skull base and vertebrae: No acute fracture. No primary bone lesion or focal pathologic process. Soft tissues and spinal canal: No prevertebral fluid or swelling. No visible canal hematoma. Disc levels: Intervertebral disc space narrowing at C5-6 and C6-7 with degenerative endplate osteophytes at these levels. Additional degenerative changes at the atlanto dens articulation. No high-grade osseous spinal canal stenosis. Disc osteophyte complex at C5-6 eccentric to the left resulting in mild spinal canal stenosis. Disc osteophyte complex at C6-7 resulting in mild spinal canal stenosis. Facet arthrosis and  uncovertebral hypertrophy at multiple levels. Foraminal stenosis most pronounced on the left at C2-3 and C5-6 as well as bilaterally at  C6-7. Upper chest: Partially visualized bilateral pleural effusions. Other: None. IMPRESSION: No CT evidence of acute intracranial abnormality. No acute fracture or traumatic malalignment of the cervical spine. No acute maxillofacial fracture. Left periorbital and facial soft tissue swelling. Partially visualized small bilateral pleural effusions. Chronic and degenerative changes as above. Electronically Signed   By: Donnice Mania M.D.   On: 01/08/2024 16:33   CT Head Wo Contrast Result Date: 01/08/2024 CLINICAL DATA:  Head trauma, unwitnessed fall out of bed, small laceration to face and bruising to cheek. EXAM: CT HEAD WITHOUT CONTRAST CT MAXILLOFACIAL WITHOUT CONTRAST CT CERVICAL SPINE WITHOUT CONTRAST TECHNIQUE: Multidetector CT imaging of the head, cervical spine, and maxillofacial structures were performed using the standard protocol without intravenous contrast. Multiplanar CT image reconstructions of the cervical spine and maxillofacial structures were also generated. RADIATION DOSE REDUCTION: This exam was performed according to the departmental dose-optimization program which includes automated exposure control, adjustment of the mA and/or kV according to patient size and/or use of iterative reconstruction technique. COMPARISON:  CT head 12/28/2023, CT cervical spine 09/04/2023. CT maxillofacial 10/16/2021. FINDINGS: CT HEAD FINDINGS Brain: No acute intracranial hemorrhage. No CT evidence of acute infarct. Nonspecific hypoattenuation in the periventricular and subcortical white matter favored to reflect chronic microvascular ischemic changes. No edema, mass effect, or midline shift. The basilar cisterns are patent. Ventricles: The ventricles are normal. Vascular: No hyperdense vessel or unexpected calcification. Skull: No acute or aggressive finding. Other: Trace fluid  in the right mastoid air cells. CT MAXILLOFACIAL FINDINGS Osseous: Maxilla: Intact Pterygoid Plates: Intact Zygomatic Arch: Intact Orbits: Intact Ethmoid: Intact Sphenoid: Intact Frontal:Intact Mandible: Intact. No condylar dislocation. Flattening and irregularity of the mandibular condyle with narrowing of the temporomandibular joint space compatible with osteoarthrosis. Nasal: Chronic irregularity of the nasal bones without evidence of acute fracture. Nasal Septum: Intact.  Leftward bowing of the nasal septum. Orbits: Globes are intact. Lenses are normally located. Extraocular muscles and optic nerve sheath complexes are unremarkable. Normal appearance of the intraorbital fat. There is swelling of the periorbital soft tissues particularly along the inferior medial aspects of the left orbit with involvement of the preseptal soft tissues and swelling of the eyelids more pronounced along the lower eyelid. Sinuses: Mucosal thickening throughout the left maxillary sinus with thickening of the sinus walls suggestive of mucoperiosteal reaction. No air-fluid levels. Soft tissues: Left periorbital soft tissue swelling with swelling also extending over the left nasal bone and extending inferiorly into the left facial soft tissues over the maxillary sinus. CT CERVICAL SPINE FINDINGS Alignment: Straightening of the normal cervical lordosis. No significant listhesis. No facet subluxation or dislocation. Asymmetric spacing between the C4 and C5 spinous processes is unchanged from multiple prior exams. Skull base and vertebrae: No acute fracture. No primary bone lesion or focal pathologic process. Soft tissues and spinal canal: No prevertebral fluid or swelling. No visible canal hematoma. Disc levels: Intervertebral disc space narrowing at C5-6 and C6-7 with degenerative endplate osteophytes at these levels. Additional degenerative changes at the atlanto dens articulation. No high-grade osseous spinal canal stenosis. Disc  osteophyte complex at C5-6 eccentric to the left resulting in mild spinal canal stenosis. Disc osteophyte complex at C6-7 resulting in mild spinal canal stenosis. Facet arthrosis and uncovertebral hypertrophy at multiple levels. Foraminal stenosis most pronounced on the left at C2-3 and C5-6 as well as bilaterally at C6-7. Upper chest: Partially visualized bilateral pleural effusions. Other: None. IMPRESSION: No CT evidence of acute intracranial abnormality. No acute fracture or traumatic malalignment of  the cervical spine. No acute maxillofacial fracture. Left periorbital and facial soft tissue swelling. Partially visualized small bilateral pleural effusions. Chronic and degenerative changes as above. Electronically Signed   By: Donnice Mania M.D.   On: 01/08/2024 16:33   CT Cervical Spine Wo Contrast Result Date: 01/08/2024 CLINICAL DATA:  Head trauma, unwitnessed fall out of bed, small laceration to face and bruising to cheek. EXAM: CT HEAD WITHOUT CONTRAST CT MAXILLOFACIAL WITHOUT CONTRAST CT CERVICAL SPINE WITHOUT CONTRAST TECHNIQUE: Multidetector CT imaging of the head, cervical spine, and maxillofacial structures were performed using the standard protocol without intravenous contrast. Multiplanar CT image reconstructions of the cervical spine and maxillofacial structures were also generated. RADIATION DOSE REDUCTION: This exam was performed according to the departmental dose-optimization program which includes automated exposure control, adjustment of the mA and/or kV according to patient size and/or use of iterative reconstruction technique. COMPARISON:  CT head 12/28/2023, CT cervical spine 09/04/2023. CT maxillofacial 10/16/2021. FINDINGS: CT HEAD FINDINGS Brain: No acute intracranial hemorrhage. No CT evidence of acute infarct. Nonspecific hypoattenuation in the periventricular and subcortical white matter favored to reflect chronic microvascular ischemic changes. No edema, mass effect, or midline  shift. The basilar cisterns are patent. Ventricles: The ventricles are normal. Vascular: No hyperdense vessel or unexpected calcification. Skull: No acute or aggressive finding. Other: Trace fluid in the right mastoid air cells. CT MAXILLOFACIAL FINDINGS Osseous: Maxilla: Intact Pterygoid Plates: Intact Zygomatic Arch: Intact Orbits: Intact Ethmoid: Intact Sphenoid: Intact Frontal:Intact Mandible: Intact. No condylar dislocation. Flattening and irregularity of the mandibular condyle with narrowing of the temporomandibular joint space compatible with osteoarthrosis. Nasal: Chronic irregularity of the nasal bones without evidence of acute fracture. Nasal Septum: Intact.  Leftward bowing of the nasal septum. Orbits: Globes are intact. Lenses are normally located. Extraocular muscles and optic nerve sheath complexes are unremarkable. Normal appearance of the intraorbital fat. There is swelling of the periorbital soft tissues particularly along the inferior medial aspects of the left orbit with involvement of the preseptal soft tissues and swelling of the eyelids more pronounced along the lower eyelid. Sinuses: Mucosal thickening throughout the left maxillary sinus with thickening of the sinus walls suggestive of mucoperiosteal reaction. No air-fluid levels. Soft tissues: Left periorbital soft tissue swelling with swelling also extending over the left nasal bone and extending inferiorly into the left facial soft tissues over the maxillary sinus. CT CERVICAL SPINE FINDINGS Alignment: Straightening of the normal cervical lordosis. No significant listhesis. No facet subluxation or dislocation. Asymmetric spacing between the C4 and C5 spinous processes is unchanged from multiple prior exams. Skull base and vertebrae: No acute fracture. No primary bone lesion or focal pathologic process. Soft tissues and spinal canal: No prevertebral fluid or swelling. No visible canal hematoma. Disc levels: Intervertebral disc space  narrowing at C5-6 and C6-7 with degenerative endplate osteophytes at these levels. Additional degenerative changes at the atlanto dens articulation. No high-grade osseous spinal canal stenosis. Disc osteophyte complex at C5-6 eccentric to the left resulting in mild spinal canal stenosis. Disc osteophyte complex at C6-7 resulting in mild spinal canal stenosis. Facet arthrosis and uncovertebral hypertrophy at multiple levels. Foraminal stenosis most pronounced on the left at C2-3 and C5-6 as well as bilaterally at C6-7. Upper chest: Partially visualized bilateral pleural effusions. Other: None. IMPRESSION: No CT evidence of acute intracranial abnormality. No acute fracture or traumatic malalignment of the cervical spine. No acute maxillofacial fracture. Left periorbital and facial soft tissue swelling. Partially visualized small bilateral pleural effusions. Chronic and degenerative changes  as above. Electronically Signed   By: Donnice Mania M.D.   On: 01/08/2024 16:33    Pertinent labs & imaging results that were available during my care of the patient were reviewed by me and considered in my medical decision making (see MDM for details).  Medications Ordered in ED Medications - No data to display                                                                                                                                   Procedures Procedures  (including critical care time)  Medical Decision Making / ED Course   This patient presents to the ED for concern of fall, this involves an extensive number of treatment options, and is a complaint that carries with it a high risk of complications and morbidity.  The differential diagnosis includes facial fracture, ICH, less likely C-spine injury.  Electrolyte abnormalities.  MDM: Patient with history of intracranial mets, will obtain imaging of the patient's head, neck and face.  Likely just contusions.  With her history of hyponatremia, electrolyte  abnormalities as well, will obtain basic labs on the patient.  Reassessment 5:15 PM-imaging negative.  Some trace pleural effusions identified on CT imaging, obtain plain films that were negative.  Patient at baseline.  Sodium chronically low.  Will discharge.  Additional history obtained: -Additional history obtained from EMS -External records from outside source obtained and reviewed including: Chart review including previous notes, labs, imaging, consultation notes   Lab Tests: -I ordered, reviewed, and interpreted labs.   The pertinent results include:   Labs Reviewed  BASIC METABOLIC PANEL WITH GFR - Abnormal; Notable for the following components:      Result Value   Sodium 127 (*)    Potassium 3.4 (*)    Chloride 95 (*)    Calcium  8.7 (*)    All other components within normal limits  CBC WITH DIFFERENTIAL/PLATELET - Abnormal; Notable for the following components:   WBC 3.9 (*)    Hemoglobin 11.2 (*)    HCT 34.1 (*)    Platelets 63 (*)    All other components within normal limits        Imaging Studies ordered: I ordered imaging studies including CT imaging of the head, neck, face, chest x-ray I independently visualized and interpreted imaging. I agree with the radiologist interpretation   Medicines ordered and prescription drug management: No orders of the defined types were placed in this encounter.   -I have reviewed the patients home medicines and have made adjustments as needed   Cardiac Monitoring: The patient was maintained on a cardiac monitor.  I personally viewed and interpreted the cardiac monitored which showed an underlying rhythm of: Normal sinus rhythm  Social Determinants of Health:  Factors impacting patients care include: Faxed to primary care   Reevaluation: After the interventions noted above, I reevaluated the patient and found that they have :improved  Co morbidities that complicate the patient evaluation  Past Medical History:   Diagnosis Date   Allergy    Anemia    Gallstones    GERD (gastroesophageal reflux disease)    Hemorrhoids    melanoma with met dz dx'd 01/2018   brain and adrenal   Seasonal allergies    Stroke [facility])       Dispostion: I considered admission for this patient, with reassuring workup she is appropriate for discharge.     Final Clinical Impression(s) / ED Diagnoses Final diagnoses:  Contusion of face, initial encounter  Closed head injury without concussion, initial encounter     @PCDICTATION @    Mannie Pac T, DO 01/08/24 1719

## 2024-01-08 NOTE — ED Notes (Signed)
 Attempted to call report to [facility] No answer.

## 2024-01-08 NOTE — ED Triage Notes (Signed)
 Pt states she was reaching for something and fell out of bed. Denies other head trauma. No thinners

## 2024-01-08 NOTE — ED Triage Notes (Signed)
 Pt had unwitnessed fall out of bed. Small laceration to face/bruising to cheek, no other injuries. Acting at baseline. Denies neck/back pain. Arrives in c-collar  104/80 Hr 72 Rr 18 98% RA Cbg 121

## 2024-01-08 NOTE — ED Notes (Signed)
 [facility] called and transport arranged to [facility]

## 2024-01-08 NOTE — Discharge Instructions (Signed)
 While Kaitlyn Duncan was in the emergency room, she had CT scans done that were normal.  Her blood work was at her baseline.  She can follow-up with her PCP.  She may continue taking all medications as prescribed.

## 2024-05-24 ENCOUNTER — Emergency Department (HOSPITAL_COMMUNITY): Payer: Medicare (Managed Care)

## 2024-05-24 ENCOUNTER — Observation Stay (HOSPITAL_COMMUNITY): Payer: Medicare (Managed Care)

## 2024-05-24 ENCOUNTER — Inpatient Hospital Stay (HOSPITAL_COMMUNITY)
Admission: EM | Admit: 2024-05-24 | Discharge: 2024-05-29 | DRG: 640 | Disposition: A | Discharge status: Skilled Nursing Facility | Payer: Medicare (Managed Care) | Source: Skilled Nursing Facility | Attending: Internal Medicine | Admitting: Internal Medicine

## 2024-05-24 DIAGNOSIS — Z808 Family history of malignant neoplasm of other organs or systems: Secondary | ICD-10-CM

## 2024-05-24 DIAGNOSIS — G9341 Metabolic encephalopathy: Secondary | ICD-10-CM | POA: Diagnosis present

## 2024-05-24 DIAGNOSIS — C439 Malignant melanoma of skin, unspecified: Secondary | ICD-10-CM | POA: Diagnosis present

## 2024-05-24 DIAGNOSIS — F29 Unspecified psychosis not due to a substance or known physiological condition: Secondary | ICD-10-CM | POA: Diagnosis present

## 2024-05-24 DIAGNOSIS — R54 Age-related physical debility: Secondary | ICD-10-CM | POA: Diagnosis present

## 2024-05-24 DIAGNOSIS — Z79899 Other long term (current) drug therapy: Secondary | ICD-10-CM

## 2024-05-24 DIAGNOSIS — E860 Dehydration: Secondary | ICD-10-CM | POA: Diagnosis not present

## 2024-05-24 DIAGNOSIS — E876 Hypokalemia: Secondary | ICD-10-CM | POA: Diagnosis not present

## 2024-05-24 DIAGNOSIS — Z8249 Family history of ischemic heart disease and other diseases of the circulatory system: Secondary | ICD-10-CM

## 2024-05-24 DIAGNOSIS — Z8673 Personal history of transient ischemic attack (TIA), and cerebral infarction without residual deficits: Secondary | ICD-10-CM

## 2024-05-24 DIAGNOSIS — Z91148 Patient's other noncompliance with medication regimen for other reason: Secondary | ICD-10-CM

## 2024-05-24 DIAGNOSIS — C7931 Secondary malignant neoplasm of brain: Secondary | ICD-10-CM

## 2024-05-24 DIAGNOSIS — G9340 Encephalopathy, unspecified: Secondary | ICD-10-CM | POA: Diagnosis not present

## 2024-05-24 DIAGNOSIS — R4182 Altered mental status, unspecified: Principal | ICD-10-CM

## 2024-05-24 DIAGNOSIS — D649 Anemia, unspecified: Secondary | ICD-10-CM | POA: Diagnosis present

## 2024-05-24 DIAGNOSIS — K8020 Calculus of gallbladder without cholecystitis without obstruction: Secondary | ICD-10-CM | POA: Diagnosis present

## 2024-05-24 DIAGNOSIS — Z85820 Personal history of malignant melanoma of skin: Secondary | ICD-10-CM

## 2024-05-24 DIAGNOSIS — C7951 Secondary malignant neoplasm of bone: Secondary | ICD-10-CM | POA: Diagnosis present

## 2024-05-24 DIAGNOSIS — M25451 Effusion, right hip: Secondary | ICD-10-CM | POA: Diagnosis present

## 2024-05-24 DIAGNOSIS — D696 Thrombocytopenia, unspecified: Secondary | ICD-10-CM | POA: Diagnosis present

## 2024-05-24 DIAGNOSIS — K219 Gastro-esophageal reflux disease without esophagitis: Secondary | ICD-10-CM | POA: Diagnosis present

## 2024-05-24 DIAGNOSIS — Z803 Family history of malignant neoplasm of breast: Secondary | ICD-10-CM

## 2024-05-24 DIAGNOSIS — Z9225 Personal history of immunosupression therapy: Secondary | ICD-10-CM

## 2024-05-24 DIAGNOSIS — Z833 Family history of diabetes mellitus: Secondary | ICD-10-CM

## 2024-05-24 DIAGNOSIS — L24A0 Irritant contact dermatitis due to friction or contact with body fluids, unspecified: Secondary | ICD-10-CM | POA: Diagnosis present

## 2024-05-24 DIAGNOSIS — E871 Hypo-osmolality and hyponatremia: Secondary | ICD-10-CM | POA: Diagnosis not present

## 2024-05-24 DIAGNOSIS — F209 Schizophrenia, unspecified: Secondary | ICD-10-CM | POA: Diagnosis present

## 2024-05-24 LAB — COMPREHENSIVE METABOLIC PANEL WITH GFR
ALT: 15 U/L (ref 0–44)
AST: 26 U/L (ref 15–41)
Albumin: 3.7 g/dL (ref 3.5–5.0)
Alkaline Phosphatase: 94 U/L (ref 38–126)
Anion gap: 16 — ABNORMAL HIGH (ref 5–15)
BUN: 32 mg/dL — ABNORMAL HIGH (ref 6–20)
CO2: 19 mmol/L — ABNORMAL LOW (ref 22–32)
Calcium: 9.2 mg/dL (ref 8.9–10.3)
Chloride: 100 mmol/L (ref 98–111)
Creatinine, Ser: 0.65 mg/dL (ref 0.44–1.00)
GFR, Estimated: >60 mL/min (ref >60)
Glucose, Bld: 81 mg/dL (ref 70–99)
Potassium: 3.6 mmol/L (ref 3.5–5.1)
Sodium: 135 mmol/L (ref 135–145)
Total Bilirubin: 1.4 mg/dL — ABNORMAL HIGH (ref 0.0–1.2)
Total Protein: 7.5 g/dL (ref 6.5–8.1)

## 2024-05-24 LAB — I-STAT CHEM 8, ED
BUN: 32 mg/dL — ABNORMAL HIGH (ref 6–20)
Calcium, Ion: 1.22 mmol/L (ref 1.15–1.40)
Chloride: 102 mmol/L (ref 98–111)
Creatinine, Ser: 0.6 mg/dL (ref 0.44–1.00)
Glucose, Bld: 78 mg/dL (ref 70–99)
HCT: 40 % (ref 36.0–46.0)
Hemoglobin: 13.6 g/dL (ref 12.0–15.0)
Potassium: 3.7 mmol/L (ref 3.5–5.1)
Sodium: 136 mmol/L (ref 135–145)
TCO2: 19 mmol/L — ABNORMAL LOW (ref 22–32)

## 2024-05-24 LAB — I-STAT CG4 LACTIC ACID, ED: Lactic Acid, Venous: 1 mmol/L (ref 0.5–1.9)

## 2024-05-24 LAB — I-STAT VENOUS BLOOD GAS, ED
Acid-base deficit: 4 mmol/L — ABNORMAL HIGH (ref 0.0–2.0)
Bicarbonate: 19.7 mmol/L — ABNORMAL LOW (ref 20.0–28.0)
Calcium, Ion: 1.18 mmol/L (ref 1.15–1.40)
HCT: 38 % (ref 36.0–46.0)
Hemoglobin: 12.9 g/dL (ref 12.0–15.0)
O2 Saturation: 81 %
Potassium: 3.7 mmol/L (ref 3.5–5.1)
Sodium: 136 mmol/L (ref 135–145)
TCO2: 21 mmol/L — ABNORMAL LOW (ref 22–32)
pCO2, Ven: 32.6 mmHg — ABNORMAL LOW (ref 44–60)
pH, Ven: 7.39 (ref 7.25–7.43)
pO2, Ven: 45 mmHg (ref 32–45)

## 2024-05-24 LAB — CBC WITH DIFFERENTIAL/PLATELET
Abs Immature Granulocytes: 0.02 K/uL (ref 0.00–0.07)
Basophils Absolute: 0 K/uL (ref 0.0–0.1)
Basophils Relative: 0 %
Eosinophils Absolute: 0 K/uL (ref 0.0–0.5)
Eosinophils Relative: 0 %
HCT: 39.8 % (ref 36.0–46.0)
Hemoglobin: 12.7 g/dL (ref 12.0–15.0)
Immature Granulocytes: 0 %
Lymphocytes Relative: 11 %
Lymphs Abs: 0.7 K/uL (ref 0.7–4.0)
MCH: 26.7 pg (ref 26.0–34.0)
MCHC: 31.9 g/dL (ref 30.0–36.0)
MCV: 83.8 fL (ref 80.0–100.0)
Monocytes Absolute: 0.5 K/uL (ref 0.1–1.0)
Monocytes Relative: 9 %
Neutro Abs: 5 K/uL (ref 1.7–7.7)
Neutrophils Relative %: 80 %
Platelets: 87 K/uL — ABNORMAL LOW (ref 150–400)
RBC: 4.75 MIL/uL (ref 3.87–5.11)
RDW: 12.5 % (ref 11.5–15.5)
WBC: 6.3 K/uL (ref 4.0–10.5)
nRBC: 0 % (ref 0.0–0.2)

## 2024-05-24 LAB — URINALYSIS, W/ REFLEX TO CULTURE (INFECTION SUSPECTED)
Bacteria, UA: NONE SEEN
Bilirubin Urine: NEGATIVE
Glucose, UA: NEGATIVE mg/dL
Ketones, ur: 20 mg/dL — AB
Nitrite: NEGATIVE
Protein, ur: >=300 mg/dL — AB
Specific Gravity, Urine: 1.015 (ref 1.005–1.030)
pH: 7 (ref 5.0–8.0)

## 2024-05-24 LAB — RESP PANEL BY RT-PCR (RSV, FLU A&B, COVID)  RVPGX2
Influenza A by PCR: NEGATIVE
Influenza B by PCR: NEGATIVE
Resp Syncytial Virus by PCR: NEGATIVE
SARS Coronavirus 2 by RT PCR: NEGATIVE

## 2024-05-24 LAB — PROTIME-INR
INR: 1.2 (ref 0.8–1.2)
Prothrombin Time: 16 s — ABNORMAL HIGH (ref 11.4–15.2)

## 2024-05-24 MED ORDER — ONDANSETRON HCL 4 MG PO TABS: 4.0000 mg | ORAL_TABLET | Freq: Four times a day (QID) | ORAL | Status: DC | PRN

## 2024-05-24 MED ORDER — CHLORHEXIDINE GLUCONATE CLOTH 2 % EX PADS
6.0000 | MEDICATED_PAD | Freq: Every day | CUTANEOUS | Status: DC
  Administered 2024-05-24 – 2024-05-29 (×6): 6 via TOPICAL

## 2024-05-24 MED ORDER — SODIUM CHLORIDE 0.9 % IV BOLUS
1000.0000 mL | Freq: Once | INTRAVENOUS | Status: AC
  Administered 2024-05-24: 1000 mL via INTRAVENOUS

## 2024-05-24 MED ORDER — ORAL CARE MOUTH RINSE
15.0000 mL | OROMUCOSAL | Status: DC
  Administered 2024-05-24 – 2024-05-28 (×15): 15 mL via OROMUCOSAL

## 2024-05-24 MED ORDER — ACETAMINOPHEN 10 MG/ML IV SOLN
1000.0000 mg | Freq: Once | INTRAVENOUS | Status: AC
  Administered 2024-05-24: 1000 mg via INTRAVENOUS
  Filled 2024-05-24: qty 100

## 2024-05-24 MED ORDER — ACETAMINOPHEN 325 MG PO TABS: 650.0000 mg | ORAL_TABLET | Freq: Four times a day (QID) | ORAL | Status: DC | PRN

## 2024-05-24 MED ORDER — GADOBUTROL 1 MMOL/ML IV SOLN
5.0000 mL | Freq: Once | INTRAVENOUS | Status: AC | PRN
Start: 2024-05-24 — End: 2024-05-24
  Administered 2024-05-24: 5 mL via INTRAVENOUS

## 2024-05-24 MED ORDER — ACETAMINOPHEN 650 MG RE SUPP: 650.0000 mg | Freq: Four times a day (QID) | RECTAL | Status: DC | PRN

## 2024-05-24 MED ORDER — SODIUM CHLORIDE 0.9 % IV SOLN: INTRAVENOUS | Status: DC

## 2024-05-24 MED ORDER — ONDANSETRON HCL 4 MG/2ML IJ SOLN: 4.0000 mg | Freq: Four times a day (QID) | INTRAMUSCULAR | Status: DC | PRN

## 2024-05-24 MED ORDER — LACTATED RINGERS IV BOLUS (SEPSIS)
1000.0000 mL | Freq: Once | INTRAVENOUS | Status: AC
Start: 2024-05-24 — End: 2024-05-24
  Administered 2024-05-24: 1000 mL via INTRAVENOUS

## 2024-05-24 MED ORDER — ORAL CARE MOUTH RINSE: 15.0000 mL | OROMUCOSAL | Status: DC | PRN

## 2024-05-24 NOTE — ED Notes (Signed)
 EDP made aware of previous attempt by previously assigned paramedic to get in/out cath but was unsuccessful. Per EDP, in/out cath not needed at this time.

## 2024-05-24 NOTE — ED Notes (Signed)
 NP contacted me by phone,   Sent over for AMS and dehydration, reports baseline is alert, and walks.

## 2024-05-24 NOTE — ED Triage Notes (Signed)
 BIB [facility] from [facility]   Staff reports minimal PO intake over the last few days and weaker than normal. Alert to verbal stimuli, lethargic compared to baseline. Pt normally able to walk, but has been unable to stand. Facility could not provide EMS normal baseline. Full Code  BGL 82 100 HR 116 60

## 2024-05-24 NOTE — ED Provider Notes (Signed)
 [place] EMERGENCY DEPARTMENT AT [facility] Provider Note   CSN: [id number] Arrival date & time: 05/24/24  1045     Patient presents with: Altered Mental Status and Weakness   Kaitlyn Duncan is a 53 y.o. female.   HPI BIB [facility] from [facility]    Staff reports minimal PO intake over the last few days and weaker than normal. Alert to verbal stimuli, lethargic compared to baseline. Pt normally able to walk, but has been unable to stand. Facility could not provide EMS normal baseline. Full Code   BGL 82 100 HR 116 60    Prior to Admission medications   Medication Sig Start Date End Date Taking? Authorizing Provider  acetaminophen  (TYLENOL ) 325 MG tablet Take 650 mg by mouth every 6 (six) hours as needed for moderate pain (pain score 4-6).    [provider]  hydrocortisone  (CORTEF ) 5 MG tablet Take 1.5 tablets (7.5 mg total) by mouth daily. 12/29/23   Arlon Carliss ORN, DO    Allergies: Patient has no known allergies.    Review of Systems  Updated Vital Signs BP 123/89   Pulse (!) 104   Temp (!) 97.4 F (36.3 C) (Axillary)   Resp 17   SpO2 99%   Physical Exam Vitals and nursing note reviewed.  Constitutional:      Appearance: She is well-developed. She is ill-appearing.  HENT:     Head: Normocephalic and atraumatic.  Eyes:     Conjunctiva/sclera: Conjunctivae normal.  Cardiovascular:     Rate and Rhythm: Regular rhythm. Tachycardia present.  Pulmonary:     Effort: Pulmonary effort is normal. No respiratory distress.     Breath sounds: Normal breath sounds. No stridor.  Abdominal:     General: There is no distension.  Skin:    General: Skin is warm and dry.     Coloration: Skin is pale.  Neurological:     Comments: Patient with minimal verbal responses to questions.  Substantial atrophy diffusely, patient moves her extremities minimally to command.  Psychiatric:        Mood and Affect: Mood normal.        Cognition and Memory: Cognition  is impaired. Memory is impaired.     (all labs ordered are listed, but only abnormal results are displayed) Labs Reviewed  CULTURE, BLOOD (ROUTINE X 2)  CULTURE, BLOOD (ROUTINE X 2)  RESP PANEL BY RT-PCR (RSV, FLU A&B, COVID)  RVPGX2  COMPREHENSIVE METABOLIC PANEL WITH GFR  CBC WITH DIFFERENTIAL/PLATELET  PROTIME-INR  URINALYSIS, W/ REFLEX TO CULTURE (INFECTION SUSPECTED)  I-STAT CG4 LACTIC ACID, ED  I-STAT CHEM 8, ED  I-STAT VENOUS BLOOD GAS, ED    EKG: None  Radiology: No results found.   Procedures   Medications Ordered in the ED  lactated ringers  bolus 1,000 mL (has no administration in time range)                                    Medical Decision Making Adult female with multiple medical problems including psychiatric and medical, presents from nursing facility with weakness.  Patient responds marginally to verbal stimuli, is ill in appearance, broad differential including sepsis, hyponatremia, electrolyte disturbance, progression of disease. Cardiac 105 sinus tach abnormal pulse ox 99% room air normal  Amount and/or Complexity of Data Reviewed Independent Historian: EMS External Data Reviewed: notes. Labs: ordered. Decision-making details documented in ED Course. Radiology: ordered  and independent interpretation performed. Decision-making details documented in ED Course. ECG/medicine tests: ordered and independent interpretation performed. Decision-making details documented in ED Course.  Risk Prescription drug management. Decision regarding hospitalization. Diagnosis or treatment significantly limited by social determinants of health.    Discharge summary from admission 2 months ago below:  Discharge Diagnoses: Principal Problem:   Hyponatremia Active Problems:   h/o Malignant neoplasm metastatic to brain [facility])   Schizophrenia spectrum disorder with psychotic disorder type not yet determined (HCC)   Leukopenia   Thrombocytopenia (HCC)   Frequent  falls   Hypokalemia  4:12 PM Patient now much more calm, resting comfortably, remains noninteractive.  Labs consistent with prior, no substantial abnormalities.  CT persistent demonstrated abnormality likely calcification, possibly metastatic disease given her history.  With persistent altered mental status I attempted to contact both the nursing facility and the patient with echocardiogram with both without success.  I did speak with the palliative care colleagues and they will consult on the patient's care. Given the patient's altered mental status, patient awaiting palliative care consult, likely admission for further monitoring, manage     Final diagnoses:  Altered mental status, unspecified altered mental status type     Garrick Charleston, MD 05/24/24 [phone]

## 2024-05-24 NOTE — ED Provider Notes (Signed)
 I took over care of this patient at 3 PM.  She is pending the remainder of her lab work Physical Exam  BP (!) 160/107 (BP Location: Left Arm)   Pulse 97   Temp 98 F (36.7 C) (Oral)   Resp 20   SpO2 99%   Physical Exam Vitals and nursing note reviewed.  Constitutional:      General: She is not in acute distress.    Appearance: She is well-developed.  HENT:     Head: Normocephalic and atraumatic.  Eyes:     Conjunctiva/sclera: Conjunctivae normal.  Cardiovascular:     Rate and Rhythm: Normal rate and regular rhythm.     Heart sounds: No murmur heard. Pulmonary:     Effort: Pulmonary effort is normal. No respiratory distress.     Breath sounds: Normal breath sounds.  Abdominal:     Palpations: Abdomen is soft.     Tenderness: There is no abdominal tenderness.  Musculoskeletal:        General: No swelling.     Cervical back: Neck supple.  Skin:    General: Skin is warm and dry.     Capillary Refill: Capillary refill takes less than 2 seconds.  Neurological:     Mental Status: She is alert.  Psychiatric:        Mood and Affect: Mood normal.     Procedures  Procedures  ED Course / MDM    Medical Decision Making Patient lab work fairly unremarkable.  She is slightly altered from her baseline.  Discussed patient's case with Dr.Kirshnan and patient will be admitted to the hospital service for further workup and management.  Problems Addressed: Altered mental status, unspecified altered mental status type: undiagnosed new problem with uncertain prognosis  Amount and/or Complexity of Data Reviewed Labs: ordered. Radiology: ordered. Discussion of management or test interpretation with external provider(s): Dr. Verdene -with him on the phone regarding the patient's case and he will admit the patient for further workup and management  Risk OTC drugs. Prescription drug management. Decision regarding hospitalization.          Gennaro Duwaine CROME, DO 05/24/24  2040

## 2024-05-24 NOTE — H&P (Signed)
 [facility] History and Physical  Kaitlyn Duncan FMW:[id number] DOB: May 28, 1971 DOA: 05/24/2024   PCP: [name] LABOR, MD  Specialists: Has been followed by Dr. Sherrod previously for her history of melanoma.  Chief Complaint: Altered mental status  HPI: Kaitlyn Duncan is a 53 y.o. female with a past medical history of malignant melanoma with brain metastasis previously currently in remission, history of schizophrenia with noncompliance, diagnosed with adrenal insufficiency in May 2025, who lives in a nursing facility.  Apparently she is a ward of the state.  Patient was sent from her nursing facility as she has not been her usual self for several days.  She has not been eating and drinking much.  Has been weaker than normal.  More lethargic.  Patient not able to provide any history at this time.  She does open her eyes briefly.  She groans and moans but unable to tell me where she hurts.  She was noted to have her right lower extremity internally rotated.  Upon trying to straighten the leg out she screamed out in pain.  No documented reports of any falls at the nursing facility.  Once again due to her altered mental status history is very limited.  In the emergency department she was found to be afebrile with normal blood pressures.  She was mildly tachycardic.  Saturations were normal.  Labs were fairly unremarkable with normal WBC.  Platelet counts were noted to be low which is chronic for her.  CT head showed nonspecific findings.  Hospitalist was called for further evaluation.  Home Medications: Patient has been noncompliant with these medications according to the pharmacy tech report. Prior to Admission medications   Medication Sig Start Date End Date Taking? Authorizing Provider  acetaminophen  (TYLENOL ) 325 MG tablet Take 650 mg by mouth every 6 (six) hours as needed for moderate pain (pain score 4-6) or mild pain (pain score 1-3).   Yes [provider]   hydrocortisone  (CORTEF ) 5 MG tablet Take 1.5 tablets (7.5 mg total) by mouth daily. 12/29/23  Yes Arlon Carliss ORN, DO  lubiprostone  (AMITIZA ) 24 MCG capsule Take 24 mcg by mouth daily.   Yes [provider]  ondansetron  (ZOFRAN ) 4 MG tablet Take 4 mg by mouth every 8 (eight) hours as needed for nausea or vomiting.   Yes [provider]  Paliperidone ER (INVEGA SUSTENNA) injection Inject 78 mg into the muscle every 30 (thirty) days.   Yes [provider]  risperiDONE  (RISPERDAL ) 1 MG tablet Take 3 mg by mouth daily.   Yes [provider]  rosuvastatin  (CRESTOR ) 40 MG tablet Take 40 mg by mouth daily.   Yes [provider]  sennosides-docusate sodium  (SENOKOT-S) 8.6-50 MG tablet Take 1 tablet by mouth in the morning and at bedtime.   Yes [provider]  Sod Fluoride-Potassium Nitrate (FLUORIDEX SENSITIVITY RELIEF) 1.1-5 % GEL Place 1 Application onto teeth daily.   Yes [provider]  sodium chloride  1 g tablet Take 1 g by mouth daily.   Yes [provider]  traMADol  (ULTRAM ) 50 MG tablet Take 25 mg by mouth every 4 (four) hours as needed for moderate pain (pain score 4-6) or severe pain (pain score 7-10).   Yes [provider]    Allergies: No Known Allergies  Past Medical History: Past Medical History:  Diagnosis Date   Allergy    Anemia    Gallstones    GERD (gastroesophageal reflux disease)    Hemorrhoids  melanoma with met dz dx'd 01/2018   brain and adrenal   Seasonal allergies    Stroke [facility])     Past Surgical History:  Procedure Laterality Date   BIOPSY  01/28/2018   Procedure: BIOPSY;  Surgeon: Celestia Agent, MD;  Location: [facility];  Service: Endoscopy;;   BREAST BIOPSY Left    ESOPHAGOGASTRODUODENOSCOPY N/A 01/28/2018   Procedure: ESOPHAGOGASTRODUODENOSCOPY (EGD);  Surgeon: Celestia Agent, MD;  Location: [facility];  Service: Endoscopy;  Laterality: N/A;   MOUTH SURGERY     OVARY  SURGERY     removed something    Social History: Lives in a skilled nursing facility.  No other information available.   Family History:  Family History  Problem Relation Age of Onset   Melanoma Mother    Hypertension Father    Breast cancer Maternal Aunt    Breast cancer Paternal Aunt    Diabetes Paternal Aunt    Breast cancer Maternal Grandmother    Breast cancer Paternal Grandmother    Diabetes Paternal Grandmother    Colon cancer Neg Hx    Esophageal cancer Neg Hx    Pancreatic cancer Neg Hx    Stomach cancer Neg Hx    Liver disease Neg Hx    Rectal cancer Neg Hx      Review of Systems -unable to do due to her altered mental status  Physical Examination  Vitals:   05/24/24 1044 05/24/24 1101 05/24/24 1405 05/24/24 1530  BP: 123/89  (!) 147/98 (!) 148/105  Pulse: (!) 104  (!) 102 96  Resp: 17  18 (!) 22  Temp: (!) 97.4 F (36.3 C)   (!) 97.3 F (36.3 C)  TempSrc: Axillary   Oral  SpO2: 98% 99% 99% 100%    BP (!) 148/105 (BP Location: Right Arm)   Pulse 96   Temp (!) 97.3 F (36.3 C) (Oral)   Resp (!) 22   SpO2 100%   General appearance: She is grimacing, moaning groaning.  Does not appear to be any distress. Head: Normocephalic, without obvious abnormality, atraumatic Eyes: Difficult exam due to lack of cooperation but pupils appear to be equal. Throat: Extremely dry mucous membranes noted Neck: no adenopathy, no carotid bruit, no JVD, supple, symmetrical, trachea midline, and thyroid  not enlarged, symmetric, no tenderness/mass/nodules Resp: clear to auscultation bilaterally Cardio: regular rate and rhythm, S1, S2 normal, no murmur, click, rub or gallop GI: soft, non-tender; bowel sounds normal; no masses,  no organomegaly Extremities: Noted to have her right lower extremity internally rotated and over her left leg.  Upon trying to straighten it out she screamed out in pain.  Knee appears to be normal.  Both feet appear to be normal with good pulses.  No  ankle swelling noted. Pulses: 2+ and symmetric Skin: Skin color, texture, turgor normal. No rashes or lesions Lymph nodes: Cervical, supraclavicular, and axillary nodes normal. Neurologic: No facial asymmetry noted.  She is disoriented.  Does not answer questions.  No obvious focal neurological deficits noted.   Labs on Admission: I have personally reviewed following labs and imaging studies  CBC: Recent Labs  Lab 05/24/24 1114 05/24/24 1204 05/24/24 1205  WBC 6.3  --   --   NEUTROABS 5.0  --   --   HGB 12.7 12.9 13.6  HCT 39.8 38.0 40.0  MCV 83.8  --   --   PLT 87*  --   --    Basic Metabolic Panel: Recent Labs  Lab 05/24/24 1114  05/24/24 1204 05/24/24 1205  NA 135 136 136  K 3.6 3.7 3.7  CL 100  --  102  CO2 19*  --   --   GLUCOSE 81  --  78  BUN 32*  --  32*  CREATININE 0.65  --  0.60  CALCIUM  9.2  --   --    GFR: CrCl cannot be calculated (Unknown ideal weight.). Liver Function Tests: Recent Labs  Lab 05/24/24 1114  AST 26  ALT 15  ALKPHOS 94  BILITOT 1.4*  PROT 7.5  ALBUMIN 3.7    Coagulation Profile: Recent Labs  Lab 05/24/24 1114  INR 1.2     Radiological Exams on Admission: CT Head Wo Contrast Result Date: 05/24/2024 EXAM: CT HEAD WITHOUT CONTRAST 05/24/2024 02:31:00 PM TECHNIQUE: CT of the head was performed without the administration of intravenous contrast. Automated exposure control, iterative reconstruction, and/or weight based adjustment of the mA/kV was utilized to reduce the radiation dose to as low as reasonably achievable. COMPARISON: CT head 01/08/2024 and MRI 11/03/2023. CLINICAL HISTORY: Mental status change, unknown cause. FINDINGS: BRAIN AND VENTRICLES: There is a 4 mm hyperdense focus at the anterosuperior aspect of the left insula (series 2 image 19 and series 5 image 38). A subtle punctate hyperdense focus was present in this location on the prior CT, and no corresponding abnormality was evident on the previous MRI. There is no  significant associated edema. No acute intracranial hemorrhage is identified elsewhere. No acute large territory infarct, midline shift, hydrocephalus, or extra-axial fluid collection is identified. There is mild cerebral atrophy. Confluent cerebral white matter hypodensities are similar to the prior CT and nonspecific but compatible with moderate to severe chronic small vessel ischemic disease. A small chronic right cerebellar infarct is noted. ORBITS: No acute abnormality. SINUSES: Chronic maxillary sinusitis. Small chronic bilateral mastoid effusions. SOFT TISSUES AND SKULL: No acute soft tissue abnormality. No skull fracture. IMPRESSION: 1. 4 mm hyperdense focus at the anterosuperior left insula without significant edema. While this may reflect a small acute parenchymal hemorrhage, subtle hyperdensity was present in this location on the prior CT and this may instead reflect progressive mineralization. 2. No other evidence of an acute intracranial abnormality. 3. Moderate to severe chronic small vessel ischemic disease. Electronically signed by: Dasie Hamburg MD 05/24/2024 02:55 PM EST RP Workstation: [id number]   DG Chest Port 1 View Result Date: 05/24/2024 CLINICAL DATA:  Weakness.  Lethargy.  Sepsis. EXAM: PORTABLE CHEST 1 VIEW COMPARISON:  01/08/2024 radiograph FINDINGS: The cardiac silhouette, mediastinal and hilar contours are within normal limits given the AP projection, portable technique and semi upright position of the patient. The lungs are clear of an acute process. No infiltrates, edema or effusions. No pulmonary lesions. No pneumothorax. The bony thorax is intact. IMPRESSION: No acute cardiopulmonary findings. Electronically Signed   By: MYRTIS Stammer M.D.   On: 05/24/2024 12:15    My interpretation of Electrocardiogram: Sinus rhythm in the 90s.  Normal axis.  Intervals are normal.  No concerning ST or T wave changes.   Problem List  Principal Problem:   Acute encephalopathy Active  Problems:   Melanoma of skin (HCC)   Schizophrenia spectrum disorder with psychotic disorder type not yet determined (HCC)   Dehydration   Acute metabolic encephalopathy   Assessment: This is a 53 year old Caucasian female who comes in from skilled nursing facility with altered mental status and poor oral intake for the past several days.  Etiology for her presentation is not entirely clear.  Plan: #1. Acute encephalopathy: Etiology unclear.  CT head revealed 4 mm hyperdense focus at the anterior superior left insula without significant edema.  Apparently this was seen previously as well.  UA is pending.  WBC is noted to be normal.  Thrombocytopenia is chronic.  Proceed with MRI brain.  Follow-up on UA.  #2. Right lower extremity pain: It was a difficult examination.  No deformity of the knee or ankle noted.  She did have significant pain with straightening of the hip.  Proceed with pelvic x-rays.  #3. Significant dehydration: Surprisingly her electrolytes and renal function are normal.  She has had very poor oral intake in several days.  She has very dry mucous membranes.  Will give her IV fluids.  Monitor urine output.  #4.  Thrombocytopenia: This is chronic.  #5.  History of metastatic melanoma: According to reports she has had brain metastases previously.  Most recent MRI done in May 2025 did not reveal any metastatic disease.  Previously followed by Dr. Sherrod.  Follow-up on MRI brain  #6.  History of schizophrenia: Noncompliant with her home medications.  When she was discharged back in May she was discharged off of all of her psychotropic medications.  Hold off on psychotropic medications for now.  #7.  History of adrenal insufficiency: This was diagnosed in May 2025.  She had a suboptimal response to ACTH  stimulation.  ACTH  level was noted to be low.  She could have secondary adrenal insufficiency.  TSH was normal back pain.  Will repeat cortisol level, ACTH  levels and thyroid  levels.   She is supposed to be on Solu-Cortef  but unclear if she is indeed taking this medication or not.  According to nursing home reports she has not been taking her medications.  #8.  Goals of care: She is a ward of the state.  She is currently full code.  Previously seen by palliative care.  Having her seen by palliative care now.  Reasonable.    DVT Prophylaxis: SCDs for now Code Status: Full code for now Family Communication: No family at bedside Disposition: Hopefully back to her skilled nursing facility when stabilized Consults called: Palliative care Admission Status: Observation    Severity of Illness: The appropriate patient status for this patient is OBSERVATION. Observation status is judged to be reasonable and necessary in order to provide the required intensity of service to ensure the patient's safety. The patient's presenting symptoms, physical exam findings, and initial radiographic and laboratory data in the context of their medical condition is felt to place them at decreased risk for further clinical deterioration. Furthermore, it is anticipated that the patient will be medically stable for discharge from the hospital within 2 midnights of admission.    Further management decisions will depend on results of further testing and patient's response to treatment.   Kaitlyn Duncan  [facility] Pager on [email or web address]  05/24/2024, 4:54 PM

## 2024-05-24 NOTE — Plan of Care (Signed)

## 2024-05-25 ENCOUNTER — Observation Stay (HOSPITAL_COMMUNITY): Payer: Medicare (Managed Care)

## 2024-05-25 DIAGNOSIS — G9340 Encephalopathy, unspecified: Secondary | ICD-10-CM | POA: Diagnosis not present

## 2024-05-25 DIAGNOSIS — G9341 Metabolic encephalopathy: Secondary | ICD-10-CM | POA: Diagnosis present

## 2024-05-25 DIAGNOSIS — F29 Unspecified psychosis not due to a substance or known physiological condition: Secondary | ICD-10-CM | POA: Diagnosis not present

## 2024-05-25 DIAGNOSIS — E876 Hypokalemia: Secondary | ICD-10-CM

## 2024-05-25 DIAGNOSIS — R4182 Altered mental status, unspecified: Secondary | ICD-10-CM | POA: Diagnosis present

## 2024-05-25 DIAGNOSIS — C7951 Secondary malignant neoplasm of bone: Secondary | ICD-10-CM | POA: Diagnosis present

## 2024-05-25 DIAGNOSIS — E860 Dehydration: Secondary | ICD-10-CM | POA: Diagnosis present

## 2024-05-25 LAB — PROTIME-INR
INR: 1.3 — ABNORMAL HIGH (ref 0.8–1.2)
Prothrombin Time: 16.7 s — ABNORMAL HIGH (ref 11.4–15.2)

## 2024-05-25 LAB — TSH: TSH: 1.132 u[IU]/mL (ref 0.350–4.500)

## 2024-05-25 LAB — COMPREHENSIVE METABOLIC PANEL WITH GFR
ALT: 11 U/L (ref 0–44)
AST: 22 U/L (ref 15–41)
Albumin: 2.9 g/dL — ABNORMAL LOW (ref 3.5–5.0)
Alkaline Phosphatase: 73 U/L (ref 38–126)
Anion gap: 13 (ref 5–15)
BUN: 18 mg/dL (ref 6–20)
CO2: 20 mmol/L — ABNORMAL LOW (ref 22–32)
Calcium: 8.3 mg/dL — ABNORMAL LOW (ref 8.9–10.3)
Chloride: 102 mmol/L (ref 98–111)
Creatinine, Ser: 0.49 mg/dL (ref 0.44–1.00)
GFR, Estimated: >60 mL/min (ref >60)
Glucose, Bld: 60 mg/dL — ABNORMAL LOW (ref 70–99)
Potassium: 3.3 mmol/L — ABNORMAL LOW (ref 3.5–5.1)
Sodium: 135 mmol/L (ref 135–145)
Total Bilirubin: 1.2 mg/dL (ref 0.0–1.2)
Total Protein: 6.2 g/dL — ABNORMAL LOW (ref 6.5–8.1)

## 2024-05-25 LAB — CBC
HCT: 32.4 % — ABNORMAL LOW (ref 36.0–46.0)
Hemoglobin: 10.5 g/dL — ABNORMAL LOW (ref 12.0–15.0)
MCH: 27 pg (ref 26.0–34.0)
MCHC: 32.4 g/dL (ref 30.0–36.0)
MCV: 83.3 fL (ref 80.0–100.0)
Platelets: 73 K/uL — ABNORMAL LOW (ref 150–400)
RBC: 3.89 MIL/uL (ref 3.87–5.11)
RDW: 12.6 % (ref 11.5–15.5)
WBC: 5.4 K/uL (ref 4.0–10.5)
nRBC: 0 % (ref 0.0–0.2)

## 2024-05-25 LAB — T4, FREE: Free T4: 0.69 ng/dL (ref 0.61–1.12)

## 2024-05-25 LAB — CORTISOL: Cortisol, Plasma: 15.9 ug/dL

## 2024-05-25 LAB — MAGNESIUM: Magnesium: 1.7 mg/dL (ref 1.7–2.4)

## 2024-05-25 MED ORDER — SODIUM CHLORIDE 0.9 % IV SOLN: INTRAVENOUS | Status: AC

## 2024-05-25 MED ORDER — POTASSIUM CHLORIDE CRYS ER 20 MEQ PO TBCR
40.0000 meq | EXTENDED_RELEASE_TABLET | Freq: Once | ORAL | Status: DC
  Filled 2024-05-25: qty 2

## 2024-05-25 MED ORDER — IBUPROFEN 200 MG PO TABS
600.0000 mg | ORAL_TABLET | Freq: Four times a day (QID) | ORAL | Status: DC | PRN
  Administered 2024-05-25 – 2024-05-26 (×2): 600 mg via ORAL
  Filled 2024-05-25 (×3): qty 3

## 2024-05-25 MED ORDER — MAGNESIUM SULFATE 2 GM/50ML IV SOLN
2.0000 g | Freq: Once | INTRAVENOUS | Status: AC
  Administered 2024-05-25: 2 g via INTRAVENOUS
  Filled 2024-05-25: qty 50

## 2024-05-25 MED ORDER — ENSURE PLUS HIGH PROTEIN PO LIQD
237.0000 mL | Freq: Two times a day (BID) | ORAL | Status: DC
  Administered 2024-05-26 – 2024-05-28 (×5): 237 mL via ORAL

## 2024-05-25 MED ORDER — PANTOPRAZOLE SODIUM 40 MG PO TBEC
40.0000 mg | DELAYED_RELEASE_TABLET | Freq: Every day | ORAL | Status: DC
  Administered 2024-05-25 – 2024-05-28 (×4): 40 mg via ORAL
  Filled 2024-05-25 (×5): qty 1

## 2024-05-25 MED ORDER — NAPHAZOLINE-GLYCERIN 0.012-0.25 % OP SOLN: 1.0000 [drp] | Freq: Four times a day (QID) | OPHTHALMIC | Status: DC | PRN

## 2024-05-25 MED ORDER — POTASSIUM CHLORIDE 10 MEQ/100ML IV SOLN
10.0000 meq | INTRAVENOUS | Status: AC
Start: 2024-05-25 — End: 2024-05-25
  Administered 2024-05-25 (×2): 10 meq via INTRAVENOUS
  Filled 2024-05-25 (×2): qty 100

## 2024-05-25 NOTE — Consult Note (Signed)
 Palliative Care Consult Note                                  Date: 05/25/2024   Patient Name: Kaitlyn Duncan  DOB: 12/14/1970  MRN: [id number]  Age / Sex: 53 y.o., female  PCP: [name] LABOR, MD Referring Physician: Verdene Purchase, MD  Reason for Consultation: Establishing goals of care  HPI/Patient Profile: 53 y.o. female  with past medical history of malignant melanoma with previous brain metastasis (currently on observation therapy with most recent MRI brain negative for active metastasis), chronic thrombocytopenia, schizophrenia with noncompliance, adrenal insufficiency (May 2025), and long-term care SNF resident who is a ward of the state.  She presented to the ED on 05/24/2024 with decreased oral intake, weakness, and lethargy.  She is admitted with acute metabolic encephalopathy, dehydration, hypokalemia,   Palliative Medicine has been consulted for goals of care discussions and complex medical decision making.   Clinical Assessment and Goals of Care:   Extensive chart review has been completed including labs, vital signs, imaging, progress/consult notes, orders, medications and available advance directive documents.    Patient assessed. She is lying is bed, awake, and calm. Oriented to self and knows she is in [place] but not in the hospital.   I spoke with patient's legal guardian Shelba Molt by phone to discuss diagnosis, prognosis, and GOC.  I introduced Palliative Medicine as specialized medical care for people living with serious illness. It focuses on providing relief from the symptoms and stress of a serious illness.   Created space and opportunity for patient and guardian to express thoughts and feelings regarding current medical situation. Values and goals of care were attempted to be elicited.  Life Review: ***  Functional Status: ***  Discussion: We discussed patient's current illness and  what it means in the larger context of his/her ongoing co-morbidities. Current clinical status was reviewed. Natural disease trajectory of *** was discussed.  Discussed the importance of continued conversation with the medical team regarding overall plan of care and treatment options, ensuring decisions are within the context of the patients values and GOCs.  Questions and concerns addressed. Patient/family encouraged to call with questions or concerns.   Review of Systems  All other systems reviewed and are negative.   Objective:   Primary Diagnoses: Present on Admission: . Acute encephalopathy . Schizophrenia spectrum disorder with psychotic disorder type not yet determined (HCC) . Melanoma of skin (HCC) . Dehydration . Acute metabolic encephalopathy   Physical Exam Vitals reviewed.  Constitutional:      General: She is not in acute distress.    Comments: Frail and chronically ill-appearing  Pulmonary:     Effort: Pulmonary effort is normal.  Neurological:     Mental Status: She is alert.     Comments: Oriented to self and city  Psychiatric:        Mood and Affect: Affect is flat.     Palliative Assessment/Data: ***     Assessment & Plan:   SUMMARY OF RECOMMENDATIONS   ***  Primary Decision Maker: {Primary Decision Fjxzm:78612}  Existing Vynca/ACP Documentation:   Code Status/Advance Care Planning: {Palliative Code status:23503}  Symptom Management:  ***  Prognosis:  {Palliative Care Prognosis:23504}  Discharge Planning:  {Palliative dispostion:23505}   Discussed with: ***    Thank you for allowing us  to participate in the care of [facility]   Time Total: ***  Greater than 50%  of this time was spent counseling and coordinating care related to the above assessment and plan.  Signed by: Recardo Loll, NP Palliative Medicine Team  Team Phone # [phone]  For individual providers, please see  AMION

## 2024-05-25 NOTE — Progress Notes (Signed)
 OT Cancellation Note  Patient Details Name: Kaitlyn Duncan MRN: [id number] DOB: 03-Jul-1971   Cancelled Treatment:    Reason Eval/Treat Not Completed: Medical issues which prohibited therapy. Waiting on stat CT of pelvis. Will continue to follow.   Kennth Mliss Helling 05/25/2024, 2:31 PM Mliss HERO, OTR/L Acute Rehabilitation Services Office: [phone]

## 2024-05-25 NOTE — Progress Notes (Signed)
 [facility] PROGRESS NOTE   Kaitlyn Duncan FMW:[id number] DOB: Nov 08, 1970 DOA: 05/24/2024  PCP: [name] LABOR, MD  Brief History: 53 y.o. female with a past medical history of malignant melanoma with brain metastasis previously currently in remission, history of schizophrenia with noncompliance, diagnosed with adrenal insufficiency in May 2025, who lives in a nursing facility.  Apparently she is a ward of the state.  Patient was sent from her nursing facility as she has not been her usual self for several days.  She has not been eating and drinking much.  Has been weaker than normal.  More lethargic.  Patient not able to provide any history at this time.  She does open her eyes briefly.  She groans and moans but unable to tell me where she hurts.  She was noted to have her right lower extremity internally rotated.  Upon trying to straighten the leg out she screamed out in pain.  No documented reports of any falls at the nursing facility.  Once again due to her altered mental status history is very limited.  She was hospitalized for further management and evaluation.  Consultants: None  Procedures: None    Subjective/Interval History: Patient is a little bit more awake and alert this morning.  Still not fully cooperative with examination.    Assessment/Plan:  Acute metabolic encephalopathy No clear etiology for her presentation except for profound dehydration.  MRI brain does not show any acute findings.  UA was abnormal but does not suggest infection.  No other infectious etiology noted.  WBC is normal.  Thrombocytopenia is chronic. Mentation seems to be slightly better today compared to yesterday.  Continue IV fluids for now.  PT OT.  SLP evaluation.  Dietitian to see.  Apparently she has had very poor oral intake at her skilled nursing facility.  Dehydration Continue with IV fluids.  Surprisingly her electrolytes and renal function were normal at  admission.  Hypokalemia Will be supplemented.  Check magnesium  level.  Normocytic anemia Drop in hemoglobin is dilutional.  Check anemia panel.  Right lower extremity pain Etiology unclear.  X-rays do not suggest any fractures.  Continue to monitor.  Her ambulatory status is unknown.  Thrombocytopenia This is chronic.  History of metastatic melanoma Previously followed by Dr. Sherrod with the cancer center.  Previous brain metastases but none as of earlier this year.   MRI done during this admission also does not show any metastatic disease.  An area of hyperdense focus thought to be mineralization possibly from prior hemorrhage or cavernous malformation.  No acute abnormalities noted.  History of adrenal insufficiency This was diagnosed in May 2025.  ACTH  level was noted to be low.  She had optimal response to ACTH  stimulation.   Apparently patient has not been taking her hydrocortisone .  Her electrolytes were normal.  Blood pressure was stable.  Continue to hold off on steroids for now. Cortisol levels noted to be normal this morning at 15.9.  TSH is normal.  Free T4 is normal.  ACTH  level is pending.    History of schizophrenia Has been noncompliant with her medications.  When she was discharged back in May 2025 she was discharged off of all of her psychotropics after discussions with psychiatry.  Continue to hold off on psychotropic medications for now.  Will see how she does in the hospital.  May need psychiatric evaluation here especially if she indeed refuses to eat and drink.  Goals of care Apparently she is a ward  of the state.  Previously seen by palliative care.  Palliative care to reevaluate.  DVT Prophylaxis: SCDs Code Status: Full code Family Communication: No family at bedside Disposition Plan: SNF when medically stable  Status is: Observation The patient will require care spanning > 2 midnights and should be moved to inpatient because: Persistent encephalopathy,  poor oral intake      Medications: Scheduled:  Chlorhexidine  Gluconate Cloth  6 each Topical Daily   mouth rinse  15 mL Mouth Rinse 4 times per day   Continuous:  sodium chloride      PRN:acetaminophen  **OR** acetaminophen , naphazoline-glycerin , ondansetron  **OR** ondansetron  (ZOFRAN ) IV, mouth rinse  Antibiotics: Anti-infectives (From admission, onward)    None       Objective:  Vital Signs  Vitals:   05/24/24 2029 05/25/24 0001 05/25/24 0643 05/25/24 0823  BP: (!) 161/107 (!) 139/102 (!) 140/83 128/88  Pulse: 95 99 97 98  Resp: 20 17 18 17   Temp:  99.2 F (37.3 C) 97.6 F (36.4 C) 99 F (37.2 C)  TempSrc:      SpO2: 99% 99% 97% 96%    Intake/Output Summary (Last 24 hours) at 05/25/2024 1023 Last data filed at 05/24/2024 1322 Gross per 24 hour  Intake 1000 ml  Output --  Net 1000 ml    General appearance: She is more awake and alert today compared to yesterday but still distracted.  Not very cooperative.  Does not answer questions. Resp: Clear to auscultation bilaterally.  Normal effort Cardio: S1-S2 is normal regular.  No S3-S4.  No rubs murmurs or bruit GI: Abdomen is soft.  Nontender nondistended.  Bowel sounds are present normal.  No masses organomegaly Extremities: Limited range of motion of lower extremities.  Not very cooperative. Neurologic:  No focal neurological deficits.    Lab Results:  Data Reviewed: I have personally reviewed following labs and reports of the imaging studies  CBC: Recent Labs  Lab 05/24/24 1114 05/24/24 1204 05/24/24 1205 05/25/24 0529  WBC 6.3  --   --  5.4  NEUTROABS 5.0  --   --   --   HGB 12.7 12.9 13.6 10.5*  HCT 39.8 38.0 40.0 32.4*  MCV 83.8  --   --  83.3  PLT 87*  --   --  73*    Basic Metabolic Panel: Recent Labs  Lab 05/24/24 1114 05/24/24 1204 05/24/24 1205 05/25/24 0529  NA 135 136 136 135  K 3.6 3.7 3.7 3.3*  CL 100  --  102 102  CO2 19*  --   --  20*  GLUCOSE 81  --  78 60*  BUN 32*   --  32* 18  CREATININE 0.65  --  0.60 0.49  CALCIUM  9.2  --   --  8.3*    GFR: CrCl cannot be calculated (Unknown ideal weight.).  Liver Function Tests: Recent Labs  Lab 05/24/24 1114 05/25/24 0529  AST 26 22  ALT 15 11  ALKPHOS 94 73  BILITOT 1.4* 1.2  PROT 7.5 6.2*  ALBUMIN 3.7 2.9*    Coagulation Profile: Recent Labs  Lab 05/24/24 1114 05/25/24 0529  INR 1.2 1.3*    Thyroid  Function Tests: Recent Labs    05/25/24 0529  TSH 1.132  FREET4 0.69    Recent Results (from the past 240 hours)  Resp panel by RT-PCR (RSV, Flu A&B, Covid) Anterior Nasal Swab     Status: None   Collection Time: 05/24/24 11:14 AM   Specimen: Anterior Nasal  Swab  Result Value Ref Range Status   SARS Coronavirus 2 by RT PCR NEGATIVE NEGATIVE Final   Influenza A by PCR NEGATIVE NEGATIVE Final   Influenza B by PCR NEGATIVE NEGATIVE Final    Comment: (NOTE) The Xpert Xpress SARS-CoV-2/FLU/RSV plus assay is intended as an aid in the diagnosis of influenza from Nasopharyngeal swab specimens and should not be used as a sole basis for treatment. Nasal washings and aspirates are unacceptable for Xpert Xpress SARS-CoV-2/FLU/RSV testing.  Fact Sheet for Patients: bloggercourse.com  Fact Sheet for Healthcare Providers: seriousbroker.it  This test is not yet approved or cleared by the [place]  FDA and has been authorized for detection and/or diagnosis of SARS-CoV-2 by FDA under an Emergency Use Authorization (EUA). This EUA will remain in effect (meaning this test can be used) for the duration of the COVID-19 declaration under Section 564(b)(1) of the Act, 21 U.S.C. section 360bbb-3(b)(1), unless the authorization is terminated or revoked.     Resp Syncytial Virus by PCR NEGATIVE NEGATIVE Final    Comment: (NOTE) Fact Sheet for Patients: bloggercourse.com  Fact Sheet for Healthcare  Providers: seriousbroker.it  This test is not yet approved or cleared by the [place]  FDA and has been authorized for detection and/or diagnosis of SARS-CoV-2 by FDA under an Emergency Use Authorization (EUA). This EUA will remain in effect (meaning this test can be used) for the duration of the COVID-19 declaration under Section 564(b)(1) of the Act, 21 U.S.C. section 360bbb-3(b)(1), unless the authorization is terminated or revoked.  Performed at [facility], [address] [place], [place] [address]   Blood Culture (routine x 2)     Status: None (Preliminary result)   Collection Time: 05/24/24 11:19 AM   Specimen: BLOOD LEFT HAND  Result Value Ref Range Status   Specimen Description BLOOD LEFT HAND  Final   Special Requests   Final    BOTTLES DRAWN AEROBIC AND ANAEROBIC Blood Culture adequate volume   Culture   Final    NO GROWTH < 12 HOURS Performed at [facility], [address], [place], [place] [address]    Report Status PENDING  Incomplete  Blood Culture (routine x 2)     Status: None (Preliminary result)   Collection Time: 05/24/24 11:51 AM   Specimen: BLOOD  Result Value Ref Range Status   Specimen Description BLOOD SITE NOT SPECIFIED  Final   Special Requests   Final    BOTTLES DRAWN AEROBIC AND ANAEROBIC Blood Culture adequate volume   Culture   Final    NO GROWTH < 24 HOURS Performed at [facility], [address], [place], [place] [address]    Report Status PENDING  Incomplete      Radiology Studies: MR BRAIN W WO CONTRAST Result Date: 05/24/2024 EXAM: MRI BRAIN WITH AND WITHOUT CONTRAST 05/24/2024 07:54:38 PM TECHNIQUE: Multiplanar multisequence MRI of the head/brain was performed with and without the administration of intravenous contrast. COMPARISON: Same day CT head. CLINICAL HISTORY: Mental status change, unknown cause; AMS, h/o melanoma, abnormal CT head FINDINGS: BRAIN AND VENTRICLES: No acute  infarct. The focus of hyperdensity seen on the same day CT head in the anterosuperior left insula has T2 hypointensity and susceptibility artifact, but no surrounding edema. No convincing acute hemorrhage. No visible associated enhancement. Additional scattered microhemorrhages are unchanged. No mass effect or midline shift. No hydrocephalus. Moderate patchy T2/FLAIR hyperintensities in the white matter nonspecific but compatible with chronic microvascular ischemic disease. Remote  left posterior limb of the internal capsule infarct. Normal flow voids. No mass or abnormal enhancement. ORBITS: No acute abnormality. SINUSES: Opacified left maxillary sinus. BONES AND SOFT TISSUES: Normal bone marrow signal and enhancement. No acute soft tissue abnormality. IMPRESSION: 1. The focus of hyperdensity seen on the same day CT head in the anterosuperior left insula has no surrounding edema, which favors mineralization (possibly from prior hemorrhage or cavernous malformation) over acute hemorrhage. 2. 2.No acute abnormality. 3. No abnormal enhancement or mass. Electronically signed by: Gilmore Molt MD 05/24/2024 08:33 PM EST RP Workstation: [id number]   DG Abd Portable 1V Result Date: 05/24/2024 CLINICAL DATA:  MRI clearance. EXAM: PORTABLE ABDOMEN - 1 VIEW COMPARISON:  Abdominal radiograph dated 01/26/2018. FINDINGS: Evaluation is limited due to overlying support wires. No radiopaque foreign object noted. No bowel dilatation or evidence of obstruction. No acute osseous pathology. IMPRESSION: No radiopaque foreign object. Electronically Signed   By: Vanetta Chou M.D.   On: 05/24/2024 18:05   DG Pelvis Portable Result Date: 05/24/2024 CLINICAL DATA:  Right hip EXAM: PORTABLE PELVIS 1-2 VIEWS COMPARISON:  Radiographs dated 11/11/2022. FINDINGS: Evaluation is very limited due to positioning. No acute fracture noted. No dislocation. There is adduction of the right femur crossing over the lower pelvis and left  femoral neck. This may represent a chronic finding with new since the prior radiograph. The soft tissues are grossly unremarkable IMPRESSION: 1. No acute fracture or dislocation. 2. Adduction of the right femur crossing over the lower pelvis and left femur. Electronically Signed   By: Vanetta Chou M.D.   On: 05/24/2024 17:10   CT Head Wo Contrast Result Date: 05/24/2024 EXAM: CT HEAD WITHOUT CONTRAST 05/24/2024 02:31:00 PM TECHNIQUE: CT of the head was performed without the administration of intravenous contrast. Automated exposure control, iterative reconstruction, and/or weight based adjustment of the mA/kV was utilized to reduce the radiation dose to as low as reasonably achievable. COMPARISON: CT head 01/08/2024 and MRI 11/03/2023. CLINICAL HISTORY: Mental status change, unknown cause. FINDINGS: BRAIN AND VENTRICLES: There is a 4 mm hyperdense focus at the anterosuperior aspect of the left insula (series 2 image 19 and series 5 image 38). A subtle punctate hyperdense focus was present in this location on the prior CT, and no corresponding abnormality was evident on the previous MRI. There is no significant associated edema. No acute intracranial hemorrhage is identified elsewhere. No acute large territory infarct, midline shift, hydrocephalus, or extra-axial fluid collection is identified. There is mild cerebral atrophy. Confluent cerebral white matter hypodensities are similar to the prior CT and nonspecific but compatible with moderate to severe chronic small vessel ischemic disease. A small chronic right cerebellar infarct is noted. ORBITS: No acute abnormality. SINUSES: Chronic maxillary sinusitis. Small chronic bilateral mastoid effusions. SOFT TISSUES AND SKULL: No acute soft tissue abnormality. No skull fracture. IMPRESSION: 1. 4 mm hyperdense focus at the anterosuperior left insula without significant edema. While this may reflect a small acute parenchymal hemorrhage, subtle hyperdensity was  present in this location on the prior CT and this may instead reflect progressive mineralization. 2. No other evidence of an acute intracranial abnormality. 3. Moderate to severe chronic small vessel ischemic disease. Electronically signed by: Dasie Hamburg MD 05/24/2024 02:55 PM EST RP Workstation: [id number]   DG Chest Port 1 View Result Date: 05/24/2024 CLINICAL DATA:  Weakness.  Lethargy.  Sepsis. EXAM: PORTABLE CHEST 1 VIEW COMPARISON:  01/08/2024 radiograph FINDINGS: The cardiac silhouette, mediastinal and hilar contours are within normal limits given the AP  projection, portable technique and semi upright position of the patient. The lungs are clear of an acute process. No infiltrates, edema or effusions. No pulmonary lesions. No pneumothorax. The bony thorax is intact. IMPRESSION: No acute cardiopulmonary findings. Electronically Signed   By: MYRTIS Stammer M.D.   On: 05/24/2024 12:15       LOS: 0 days   Kaitlyn Duncan Verdene  [facility] Pager on [email or web address]  05/25/2024, 10:23 AM

## 2024-05-25 NOTE — Progress Notes (Signed)
 Initial Nutrition Assessment  DOCUMENTATION CODES:   Not applicable  INTERVENTION:  Regular diet, thin liquids Encourage PO intake  Assistance with meals as needed Automatic trays  Ensure Plus High Protein po BID, each supplement provides 350 kcal and 20 grams of protein. If mental status changes and patient not abel to take PO, recommend placing cortrak and started tube feeds for nutrition   NUTRITION DIAGNOSIS:   Inadequate oral intake related to poor appetite as evidenced by  (chart review).   GOAL:   Patient will meet greater than or equal to 90% of their needs   MONITOR:   PO intake, Supplement acceptance, Labs, Weight trends  REASON FOR ASSESSMENT:   Consult Assessment of nutrition requirement/status  ASSESSMENT:   Past medical history of malignant melanoma with brain metastasis previously currently in remission, history of schizophrenia with noncompliance, diagnosed with adrenal insufficiency in May 2025, who lives in a nursing facility. Sent from her nursing facility as she has not been her usual self for several days.  Patient seen in room sleeping, just returned from CT. Pt very lethargic and difficulty to arouse. Pt woke briefly when asked is she was hungry for lunch which was at bedside and pt responded no. Per H&P, pt presents from SNF due to decreased PO intake and increased lethargy. Evaluated bu SLP today and cleared for regular +thin liquids, did report she has had a decrease in PO intake recently. Some mild wasting noted on exam, pt overall appears to be thin/lean at baseline. Non significant weight loss over the past year noted in weight hx.   Admit/current weight: 51.1 kg  11% weight loss x 1 year, not clinically significant. Pt unable to provide weight history.  Nutritionally Relevant Medications: protonix    Labs Reviewed: K 3.3, Glu 60, Calcium  8.3    NUTRITION - FOCUSED PHYSICAL EXAM:  Flowsheet Row Most Recent Value  Orbital Region No depletion   Upper Arm Region Mild depletion  Thoracic and Lumbar Region No depletion  Buccal Region No depletion  Temple Region Mild depletion  Clavicle Bone Region No depletion  Clavicle and Acromion Bone Region No depletion  Scapular Bone Region No depletion  Dorsal Hand No depletion  Patellar Region No depletion  Anterior Thigh Region No depletion  Posterior Calf Region Mild depletion  Hair Reviewed  Eyes Unable to assess  Mouth Reviewed  Skin Reviewed  Nails Reviewed    Diet Order:   Diet Order             Diet regular Room service appropriate? No; Fluid consistency: Thin  Diet effective now                   EDUCATION NEEDS:   Not appropriate for education at this time  Skin:  Skin Assessment: Reviewed RN Assessment  Last BM:  11/21  Height:   Ht Readings from Last 1 Encounters:  12/28/23 5' (1.524 m)    Weight:   Wt Readings from Last 1 Encounters:  05/25/24 51.1 kg    BMI:  Body mass index is 22 kg/m.  Estimated Nutritional Needs:   Kcal:  8724-8464  Protein:  50-60g  Fluid:  1.2-1.5 L/d  Madalyn Potters, MS, RD, LDN Clinical Dietitian  Contact via secure chat. If unavailable, use group chat RD Inpatient.

## 2024-05-25 NOTE — Evaluation (Signed)
 Clinical/Bedside Swallow Evaluation Patient Details  Name: Kaitlyn Duncan MRN: [id number] Date of Birth: [birth year]  Today's Date: 05/25/2024 Time: SLP Start Time (ACUTE ONLY): 0913 SLP Stop Time (ACUTE ONLY): 0932 SLP Time Calculation (min) (ACUTE ONLY): 19 min  Past Medical History:  Past Medical History:  Diagnosis Date   Allergy    Anemia    Gallstones    GERD (gastroesophageal reflux disease)    Hemorrhoids    melanoma with met dz dx'd 01/2018   brain and adrenal   Seasonal allergies    Stroke [facility])    Past Surgical History:  Past Surgical History:  Procedure Laterality Date   BIOPSY  01/28/2018   Procedure: BIOPSY;  Surgeon: Celestia Agent, MD;  Location: [facility];  Service: Endoscopy;;   BREAST BIOPSY Left    ESOPHAGOGASTRODUODENOSCOPY N/A 01/28/2018   Procedure: ESOPHAGOGASTRODUODENOSCOPY (EGD);  Surgeon: Celestia Agent, MD;  Location: [facility];  Service: Endoscopy;  Laterality: N/A;   MOUTH SURGERY     OVARY SURGERY     removed something   HPI:  Kaitlyn Duncan is a 53 y.o. female whowas  sent from her nursing facility as she has not been her usual self for several days.  She has not been eating and drinking much.  Has been weaker than normal.  More lethargic. CXR and MRI 11/20 without acute findings.  Pt with a past medical history of malignant melanoma with brain metastasis previously currently in remission, history of schizophrenia with noncompliance, diagnosed with adrenal insufficiency in May 2025.    Assessment / Plan / Recommendation  Clinical Impression  Pt presents with functional swallowing as assessed clinically.  Pt asleep on arrival, but roused with oral care.  SLP removed copious thick, dried, yellow secretions in oral cavity.  Continue regular, thorough oral care.  Pt tolerated all consistencies trialed without any clinical s/s of aspiration, including serial straw sips of thin liquid, and exhibited good oral clearance of solids and ice  chip after prolonged oral phase. Pt requesting sprite.  Pt endorses poor appetite, somnolence affecting PO intake.  Consider RD consult if indicated to optimize oral nutrition.  Pt has no further ST needs for swallowing.  SLP Kaitlyn sign off, please reconsult if there is change in swallow function.  Recommend regular texture diet with thin liquid when pt is awake/alert.  Pt Kaitlyn likely need feeding assistance.  SLP Visit Diagnosis: Dysphagia, unspecified (R13.10)    Aspiration Risk  Mild aspiration risk    Diet Recommendation Regular;Thin liquid    Liquid Administration via: Cup;Straw Medication Administration:  (No specific precautions, as tolerated) Supervision: Staff to assist with self feeding;Full supervision/cueing for compensatory strategies Compensations: Slow rate;Small sips/bites Postural Changes: Seated upright at 90 degrees    Other  Recommendations Oral Care Recommendations: Oral care BID;Oral care QID     Assistance Recommended at Discharge  N/A  Functional Status Assessment Patient has not had a recent decline in their functional status  Frequency and Duration  (N/A)          Prognosis Prognosis for improved oropharyngeal function:  (N/A)      Swallow Study   General Date of Onset: 05/23/24 HPI: Kaitlyn Duncan is a 53 y.o. female whowas  sent from her nursing facility as she has not been her usual self for several days.  She has not been eating and drinking much.  Has been weaker than normal.  More lethargic. CXR and MRI 11/20 without acute findings.  Pt with a  past medical history of malignant melanoma with brain metastasis previously currently in remission, history of schizophrenia with noncompliance, diagnosed with adrenal insufficiency in May 2025. Type of Study: Bedside Swallow Evaluation Previous Swallow Assessment: none Diet Prior to this Study: NPO Temperature Spikes Noted: No Respiratory Status: Room air History of Recent Intubation:  No Behavior/Cognition: Alert;Pleasant mood;Cooperative Oral Cavity Assessment: Dried secretions;Excessive secretions Oral Care Completed by SLP: Yes Oral Cavity - Dentition: Adequate natural dentition Self-Feeding Abilities: Needs assist Patient Positioning: Upright in bed Baseline Vocal Quality: Normal Volitional Cough: Cognitively unable to elicit Volitional Swallow: Unable to elicit    Oral/Motor/Sensory Function Overall Oral Motor/Sensory Function: Mild impairment Facial ROM: Within Functional Limits Facial Symmetry: Within Functional Limits Lingual Symmetry: Within Functional Limits Lingual Strength: Reduced Velum:  (reduced) Mandible: Within Functional Limits   Ice Chips Ice chips: Impaired Oral Phase Impairments: Impaired mastication Oral Phase Functional Implications: Prolonged oral transit   Thin Liquid Thin Liquid: Within functional limits Presentation: Straw;Spoon    Nectar Thick Nectar Thick Liquid: Not tested   Honey Thick Honey Thick Liquid: Not tested   Puree Puree: Impaired Oral Phase Functional Implications: Prolonged oral transit   Solid     Solid: Impaired Oral Phase Functional Implications: Prolonged oral transit      Anette FORBES Grippe, MA, CCC-SLP Acute Rehabilitation Services Office: [phone] 05/25/2024,9:45 AM

## 2024-05-25 NOTE — Progress Notes (Signed)
 RN notified MD pt urinating around the foley, bladder scan preformed after pt voided and remained. MD ordered to remove foley and ordered a STAT CT of pelvis.

## 2024-05-25 NOTE — Evaluation (Signed)
 Physical Therapy Evaluation Patient Details Name: Kaitlyn Duncan MRN: [id number] DOB: 08-Apr-1971 Today's Date: 05/25/2024  History of Present Illness  Pt is a 53 y.o. female who presented 05/24/24 from her nursing facility with lethargy, weakness, and poor oral intake. Pt admitted with acute metabolic encephalopathy, hypokalemia, and dehydration. Imaging of pelvis negative for fx. MRI brain negative for acute intracranial abnormality. PMH includes: anemia, melanoma with mets to brain with whole brain radiation currently in remission, schizophrenia with noncompliance, adrenal insufficiency in May 2025, CVA   Clinical Impression  Pt presents with condition above and deficits mentioned below, see PT Problem List. The pt is a poor historian, currently believing she is in [place] . She was unable to recall the last time she ambulated, but per PT notes when she was admitted in May 2025, she was ambulatory with +1 assist using a RW. Currently, the pt holds her hips in adduction and internal rotation and her R hip and knee in flexion. It is unclear if her R knee has a flexion contracture or if the pt is actively resisting full extension due to pain with any R leg movement. She even held her R leg up off the ground when sitting EOB and with transfers, only using her arms and L leg. She displays a tendency to lean to her R supine and sitting as well, impacting her balance. She displays poor awareness to correct it though to maintain her safety. She is currently requiring max-total assist for all functional mobility. She was unable to come to a full stand (only ~50% of a stand) and was thus unable to ambulate this date. It is unclear how far she is from her baseline. Recommend she return to her nursing facility and receive skilled PT services there at d/c. Will continue to follow acutely and address her deficits as able.        If plan is discharge home, recommend the following: Two people to help with  walking and/or transfers;Two people to help with bathing/dressing/bathroom;Assistance with cooking/housework;Assistance with feeding;Direct supervision/assist for medications management;Direct supervision/assist for financial management;Assist for transportation;Help with stairs or ramp for entrance;Supervision due to cognitive status   Can travel by private vehicle   No    Equipment Recommendations Other (comment) (defer to next venue of care)  Recommendations for Other Services       Functional Status Assessment Patient has had a recent decline in their functional status and demonstrates the ability to make significant improvements in function in a reasonable and predictable amount of time.     Precautions / Restrictions Precautions Precautions: Fall Recall of Precautions/Restrictions: Impaired Restrictions Weight Bearing Restrictions Per Provider Order: No      Mobility  Bed Mobility Overal bed mobility: Needs Assistance Bed Mobility: Supine to Sit, Sit to Supine, Rolling Rolling: Max assist, Used rails   Supine to sit: Max assist, HOB elevated Sit to supine: Total assist, HOB elevated   General bed mobility comments: Cues provided to bring bil legs off L EOB with pt moving them slightly and pulling on therapist to sit up L EOB, but poor strength and initiation through core, needing maxA to transition supine to sit L EOB. Total assist needed to return pt to supine in bed. MaxA to roll bil for pericare and linen changes, pt using rails.    Transfers Overall transfer level: Needs assistance Equipment used: 1 person hand held assist Transfers: Sit to/from Stand, Bed to chair/wheelchair/BSC Sit to Stand: Max assist     Squat pivot  transfers: Total assist     General transfer comment: Pt did use her L leg and pull up on therapist to try to stand when cued (holds R leg with hip and knee flexed and foot off the ground throughout). Pt needed maxA to power up to a half stand and  total assist to squat pivot to L along EOB x2 reps.    Ambulation/Gait               General Gait Details: unable at this time  Stairs            Wheelchair Mobility     Tilt Bed    Modified Rankin (Stroke Patients Only) Modified Rankin (Stroke Patients Only) Pre-Morbid Rankin Score: Moderately severe disability Modified Rankin: Severe disability     Balance Overall balance assessment: Needs assistance Sitting-balance support: No upper extremity supported, Feet supported Sitting balance-Leahy Scale: Poor Sitting balance - Comments: Leans to R with poor awareness to correct it, needing min-modA for static sitting balance, up to maxA for dynamic sitting Postural control: Right lateral lean Standing balance support: Bilateral upper extremity supported Standing balance-Leahy Scale: Zero Standing balance comment: only able to come to a half stand with maxA, pt only using her L leg while holding R leg off the ground                             Pertinent Vitals/Pain Pain Assessment Pain Assessment: Faces Faces Pain Scale: Hurts even more Pain Location: R leg Pain Descriptors / Indicators: Discomfort, Grimacing, Guarding, Moaning Pain Intervention(s): Limited activity within patient's tolerance, Monitored during session, Repositioned    Home Living Family/patient expects to be discharged to:: Skilled nursing facility                   Additional Comments: Per chart, pt is a resident at a nursing facility    Prior Function Prior Level of Function : Patient poor historian/Family not available             Mobility Comments: Per PT notes May 2025, she was needing +1 assist to ambulate short distances with a RW while admitted during that times. Unsure of her baseline at her facility as pt cannot recall and no family is present to provide info       Extremity/Trunk Assessment   Upper Extremity Assessment Upper Extremity Assessment: Defer to  OT evaluation    Lower Extremity Assessment Lower Extremity Assessment: RLE deficits/detail;LLE deficits/detail;Generalized weakness;Difficult to assess due to impaired cognition RLE Deficits / Details: pt hold R leg with hip flexed, adducted, and internally rotated and R knee in flexion; with extended passive stretching, able to achieve approximately -45' knee extension and about neutral hip position; strength and other testing limited by pt's impaired cognition LLE Deficits / Details: able to fully extend knee in supine; rests with hip adducted but can achieve some adbuction with extra time and stretching; able to lift leg slightly off bed surface when cued, 2+ hip flexion strength    Cervical / Trunk Assessment Cervical / Trunk Assessment: Kyphotic  Communication   Communication Communication: Impaired Factors Affecting Communication: Reduced clarity of speech (mumbles)    Cognition Arousal: Alert Behavior During Therapy: Flat affect   PT - Cognitive impairments: History of cognitive impairments                       PT - Cognition Comments: Per chart  review, pt has a hx of cognitive deficits. At this time, pt thinks she is in [place] . She needs redirecting and step-by-step multi-modal cues to initiate and sequence all mobility. She displays poor awareness, leaning to the R with little attempts to correct herself to maintain her safety. Unsure if this is her baseline or not. Following commands: Impaired Following commands impaired: Follows one step commands inconsistently, Follows one step commands with increased time     Cueing Cueing Techniques: Verbal cues, Tactile cues     General Comments      Exercises     Assessment/Plan    PT Assessment Patient needs continued PT services  PT Problem List Decreased strength;Decreased range of motion;Decreased activity tolerance;Decreased balance;Decreased mobility;Decreased coordination;Decreased cognition;Decreased  knowledge of use of DME;Decreased safety awareness;Pain       PT Treatment Interventions DME instruction;Gait training;Functional mobility training;Therapeutic activities;Therapeutic exercise;Balance training;Neuromuscular re-education;Cognitive remediation;Patient/family education;Wheelchair mobility training    PT Goals (Current goals can be found in the Care Plan section)  Acute Rehab PT Goals Patient Stated Goal: to reduce R leg pain PT Goal Formulation: With patient Time For Goal Achievement: 06/08/24 Potential to Achieve Goals: Fair    Frequency Min 2X/week     Co-evaluation               AM-PAC PT 6 Clicks Mobility  Outcome Measure Help needed turning from your back to your side while in a flat bed without using bedrails?: A Lot Help needed moving from lying on your back to sitting on the side of a flat bed without using bedrails?: A Lot Help needed moving to and from a bed to a chair (including a wheelchair)?: Total Help needed standing up from a chair using your arms (e.g., wheelchair or bedside chair)?: Total Help needed to walk in hospital room?: Total Help needed climbing 3-5 steps with a railing? : Total 6 Click Score: 8    End of Session   Activity Tolerance: Patient limited by pain Patient left: in bed;with call bell/phone within reach;with bed alarm set   PT Visit Diagnosis: Unsteadiness on feet (R26.81);Muscle weakness (generalized) (M62.81);Difficulty in walking, not elsewhere classified (R26.2);Pain Pain - Right/Left: Right Pain - part of body: Leg    Time: 1259-1319 PT Time Calculation (min) (ACUTE ONLY): 20 min   Charges:   PT Evaluation $PT Eval Moderate Complexity: 1 Mod   PT General Charges $$ ACUTE PT VISIT: 1 Visit         Theo Ferretti, PT, DPT Acute Rehabilitation Services  Office: [phone]   Theo CHRISTELLA Ferretti 05/25/2024, 1:54 PM

## 2024-05-25 NOTE — Consult Note (Signed)
 WOC Nurse Consult Note: Reason for Consult: groin Patient from SNF; AMS and weakness. Was ambulatory however less in last few days Wound type: ICD; Irritant Contact Dermatitis; irritant contact dermatitis. ICD-10 CM Codes for Irritant Dermatitis L24A0 - Due to friction or contact with body fluids L24A2 - Due to fecal, urinary or dual incontinence L30.4  - Erythema intertrigo. Also used for abrasion of the hand, chafing of the skin, dermatitis due to sweating and friction, friction dermatitis, friction eczema, and genital/thigh intertrigo.  Pressure Injury POA: NA Measurement: NA Wound bed: redness; fungal appearing  Drainage (amount, consistency, odor) see nursing flow sheets Periwound: intact  Dressing procedure/placement/frequency: Order Gerlean # [phone] Measure and cut length of InterDry to fit in skin folds that have skin breakdown Tuck InterDry fabric into skin folds in a single layer, allow for 2 inches of overhang from skin edges to allow for wicking to occur May remove to bathe; dry area thoroughly and then tuck into affected areas again Do not apply any creams or ointments when using InterDry DO NOT THROW AWAY FOR 5 DAYS unless soiled with stool DO NOT [facility] product, this will inactivate the silver in the material  New sheet of Interdry should be applied after 5 days of use if patient continues to have skin breakdown      Re consult if needed, will not follow at this time. Thanks  Ciarrah [employer]ae [employer], RN,CWOCN, CNS, [employer] [phone]

## 2024-05-26 ENCOUNTER — Inpatient Hospital Stay (HOSPITAL_COMMUNITY): Payer: Medicare (Managed Care)

## 2024-05-26 DIAGNOSIS — F29 Unspecified psychosis not due to a substance or known physiological condition: Secondary | ICD-10-CM | POA: Diagnosis not present

## 2024-05-26 DIAGNOSIS — G9340 Encephalopathy, unspecified: Secondary | ICD-10-CM | POA: Diagnosis not present

## 2024-05-26 DIAGNOSIS — E860 Dehydration: Secondary | ICD-10-CM | POA: Diagnosis not present

## 2024-05-26 LAB — MAGNESIUM: Magnesium: 1.6 mg/dL — ABNORMAL LOW (ref 1.7–2.4)

## 2024-05-26 LAB — COMPREHENSIVE METABOLIC PANEL WITH GFR
ALT: 11 U/L (ref 0–44)
AST: 22 U/L (ref 15–41)
Albumin: 2.6 g/dL — ABNORMAL LOW (ref 3.5–5.0)
Alkaline Phosphatase: 59 U/L (ref 38–126)
Anion gap: 8 (ref 5–15)
BUN: 9 mg/dL (ref 6–20)
CO2: 22 mmol/L (ref 22–32)
Calcium: 7.7 mg/dL — ABNORMAL LOW (ref 8.9–10.3)
Chloride: 103 mmol/L (ref 98–111)
Creatinine, Ser: 0.5 mg/dL (ref 0.44–1.00)
GFR, Estimated: >60 mL/min (ref >60)
Glucose, Bld: 97 mg/dL (ref 70–99)
Potassium: 3.6 mmol/L (ref 3.5–5.1)
Sodium: 133 mmol/L — ABNORMAL LOW (ref 135–145)
Total Bilirubin: 0.5 mg/dL (ref 0.0–1.2)
Total Protein: 5.4 g/dL — ABNORMAL LOW (ref 6.5–8.1)

## 2024-05-26 LAB — CBC
HCT: 30.4 % — ABNORMAL LOW (ref 36.0–46.0)
Hemoglobin: 10 g/dL — ABNORMAL LOW (ref 12.0–15.0)
MCH: 26.9 pg (ref 26.0–34.0)
MCHC: 32.9 g/dL (ref 30.0–36.0)
MCV: 81.7 fL (ref 80.0–100.0)
Platelets: 86 K/uL — ABNORMAL LOW (ref 150–400)
RBC: 3.72 MIL/uL — ABNORMAL LOW (ref 3.87–5.11)
RDW: 12.4 % (ref 11.5–15.5)
WBC: 6.3 K/uL (ref 4.0–10.5)
nRBC: 0 % (ref 0.0–0.2)

## 2024-05-26 LAB — IRON AND TIBC
Iron: 25 ug/dL — ABNORMAL LOW (ref 28–170)
Saturation Ratios: 16 % (ref 10.4–31.8)
TIBC: 157 ug/dL — ABNORMAL LOW (ref 250–450)
UIBC: 132 ug/dL

## 2024-05-26 LAB — RETICULOCYTES
Immature Retic Fract: 10.4 % (ref 2.3–15.9)
RBC.: 3.72 MIL/uL — ABNORMAL LOW (ref 3.87–5.11)
Retic Count, Absolute: 50.6 K/uL (ref 19.0–186.0)
Retic Ct Pct: 1.4 % (ref 0.4–3.1)

## 2024-05-26 LAB — FERRITIN: Ferritin: 116 ng/mL (ref 11–307)

## 2024-05-26 LAB — PHOSPHORUS: Phosphorus: 1.8 mg/dL — ABNORMAL LOW (ref 2.5–4.6)

## 2024-05-26 LAB — FOLATE: Folate: 11 ng/mL (ref >5.9)

## 2024-05-26 LAB — VITAMIN B12: Vitamin B-12: 980 pg/mL — ABNORMAL HIGH (ref 180–914)

## 2024-05-26 LAB — ACTH: C206 ACTH: 6.7 pg/mL — ABNORMAL LOW (ref 7.2–63.3)

## 2024-05-26 MED ORDER — GADOBUTROL 1 MMOL/ML IV SOLN
5.0000 mL | Freq: Once | INTRAVENOUS | Status: AC | PRN
  Administered 2024-05-26: 5 mL via INTRAVENOUS

## 2024-05-26 MED ORDER — SODIUM CHLORIDE 0.9 % IV SOLN: INTRAVENOUS | Status: DC

## 2024-05-26 MED ORDER — MAGNESIUM SULFATE 4 GM/100ML IV SOLN
4.0000 g | Freq: Once | INTRAVENOUS | Status: AC
  Administered 2024-05-26: 4 g via INTRAVENOUS
  Filled 2024-05-26: qty 100

## 2024-05-26 MED ORDER — POTASSIUM PHOSPHATES 15 MMOLE/5ML IV SOLN
30.0000 mmol | Freq: Once | INTRAVENOUS | Status: AC
  Administered 2024-05-26: 30 mmol via INTRAVENOUS
  Filled 2024-05-26: qty 10

## 2024-05-26 MED ORDER — LORAZEPAM 2 MG/ML IJ SOLN: 0.5000 mg | Freq: Once | INTRAMUSCULAR | Status: DC

## 2024-05-26 MED ORDER — LORAZEPAM 2 MG/ML IJ SOLN
0.5000 mg | Freq: Once | INTRAMUSCULAR | Status: DC | PRN
Start: 2024-05-26 — End: 2024-05-29

## 2024-05-26 NOTE — Progress Notes (Signed)
 [facility] PROGRESS NOTE   Kaitlyn Duncan FMW:[id number] DOB: 1970/11/05 DOA: 05/24/2024  PCP: [name] LABOR, MD  Brief History: 53 y.o. female with a past medical history of malignant melanoma with brain metastasis previously currently in remission, history of schizophrenia with noncompliance, diagnosed with adrenal insufficiency in May 2025, who lives in a nursing facility.  Apparently she is a ward of the state.  Patient was sent from her nursing facility as she has not been her usual self for several days.  She has not been eating and drinking much.  Has been weaker than normal.  More lethargic.  Patient not able to provide any history at this time.  She does open her eyes briefly.  She groans and moans but unable to tell me where she hurts.  She was noted to have her right lower extremity internally rotated.  Upon trying to straighten the leg out she screamed out in pain.  No documented reports of any falls at the nursing facility.  Once again due to her altered mental status history is very limited.  She was hospitalized for further management and evaluation.  Consultants: None  Procedures: None    Subjective/Interval History: Patient noted to be more awake and alert this morning.  More cooperative this morning.  States that the pain in the right leg has improved though still present.   Assessment/Plan:  Acute metabolic encephalopathy No clear etiology for her presentation except for profound dehydration.  MRI brain does not show any acute findings.  UA was abnormal but does not suggest infection.  No other infectious etiology noted.  WBC is normal.  Thrombocytopenia is chronic. Patient was started on IV hydration.  Mentation is gradually improving. Seen by PT OT SLP.  Dehydration Hydration status has improved.  Oral intake remains poor.  Continue with IV fluids at a lower rate.    Hypokalemia/hypomagnesemia/hypophosphatemia Continue to monitor and  supplement as indicated.    Normocytic anemia Drop in hemoglobin is dilutional.  No evidence of overt bleeding. Anemia panel shows ferritin of 116, iron 25, TIBC 157, percent saturation 16.  Folic acid 11.  B12 level 980.  Right lower extremity pain Etiology unclear.  X-rays do not suggest any fractures.   Call was placed to patient's nursing facility.  Discussed with nursing staff there.  Apparently patient was using a walker to ambulate until very recently.  So it appears that her right lower extremity pain is new.  Pelvic CT scan was done yesterday.  This revealed expansile lucent lesions in the left acetabulum, right acetabulum, S2 vertebrae and a mixed sclerotic and lucent expansile lesion in the right sacral ala with progression of S2 changes compared to 2023.  This raises concern for metastatic disease.  Small right hip effusion was noted. Patient with improved mobility of the right lower extremity though still limited.  No fractures noted on pelvic CT. In order to further characterize the lesion is noted on the CT scan we will proceed with MRI of the lumbar spine.  Thrombocytopenia This is chronic.  History of metastatic melanoma Previously followed by Dr. Sherrod with the cancer center.  Previous brain metastases but none as of earlier this year.   MRI brain done during this admission also does not show any metastatic disease.  An area of hyperdense focus thought to be mineralization possibly from prior hemorrhage or cavernous malformation.  No acute abnormalities noted. Pelvic CT raises concern for metastatic disease.  Will order MRI.  History of  adrenal insufficiency This was diagnosed in May 2025.  ACTH  level was noted to be low.  She had optimal response to ACTH  stimulation.   Apparently patient has not been taking her hydrocortisone .  Her electrolytes were normal.  Blood pressure was stable.  Continue to hold off on steroids for now. Cortisol levels were repeated during this  admission and noted to be normal at 15.9.   TSH is normal.  Free T4 is normal.  ACTH  level is pending.    History of schizophrenia Has been noncompliant with her medications.  When she was discharged back in May 2025 she was discharged off of all of her psychotropics after discussions with psychiatry.  Continue to hold off on psychotropic medications for now.  Will see how she does in the hospital.  May need psychiatric evaluation here especially if she indeed refuses to eat and drink.  Goals of care Apparently she is a ward of the state.  Previously seen by palliative care.  Palliative care to reevaluate.  DVT Prophylaxis: SCDs Code Status: Full code Family Communication: No family at bedside Disposition Plan: Back to SNF when medically stable.  It appears that she is a ward of the state.    Medications: Scheduled:  Chlorhexidine  Gluconate Cloth  6 each Topical Daily   feeding supplement  237 mL Oral BID BM   mouth rinse  15 mL Mouth Rinse 4 times per day   pantoprazole   40 mg Oral Daily   Continuous:  sodium chloride      magnesium  sulfate bolus IVPB 4 g (05/26/24 0906)   potassium PHOSPHATE  IVPB (in mmol)     PRN:acetaminophen  **OR** acetaminophen , ibuprofen , naphazoline-glycerin , ondansetron  **OR** ondansetron  (ZOFRAN ) IV, mouth rinse    Objective:  Vital Signs  Vitals:   05/25/24 2102 05/26/24 0116 05/26/24 0431 05/26/24 0745  BP: (!) 129/97 128/89 133/88 (!) 144/108  Pulse: 99 98 (!) 107 (!) 113  Resp: 17 17 17 19   Temp: 97.9 F (36.6 C) 98.8 F (37.1 C) 98.7 F (37.1 C) 97.8 F (36.6 C)  TempSrc:      SpO2: 97% 96% 97%   Weight:        Intake/Output Summary (Last 24 hours) at 05/26/2024 1040 Last data filed at 05/26/2024 0458 Gross per 24 hour  Intake 2120 ml  Output 700 ml  Net 1420 ml    General appearance: Much more awake and alert today compared to yesterday. Resp: Clear to auscultation bilaterally.  Normal effort Cardio: S1-S2 is normal regular.   No S3-S4.  No rubs murmurs or bruit GI: Abdomen is soft.  Nontender nondistended.  Bowel sounds are present normal.  No masses organomegaly Extremities: Continues to have limited range of motion of the lower extremities right greater than left.  However range of motion is better today compared to yesterday.   Lab Results:  Data Reviewed: I have personally reviewed following labs and reports of the imaging studies  CBC: Recent Labs  Lab 05/24/24 1114 05/24/24 1204 05/24/24 1205 05/25/24 0529 05/26/24 0536  WBC 6.3  --   --  5.4 6.3  NEUTROABS 5.0  --   --   --   --   HGB 12.7 12.9 13.6 10.5* 10.0*  HCT 39.8 38.0 40.0 32.4* 30.4*  MCV 83.8  --   --  83.3 81.7  PLT 87*  --   --  73* 86*    Basic Metabolic Panel: Recent Labs  Lab 05/24/24 1114 05/24/24 1204 05/24/24 1205 05/25/24 0529 05/25/24  1036 05/26/24 0536  NA 135 136 136 135  --  133*  K 3.6 3.7 3.7 3.3*  --  3.6  CL 100  --  102 102  --  103  CO2 19*  --   --  20*  --  22  GLUCOSE 81  --  78 60*  --  97  BUN 32*  --  32* 18  --  9  CREATININE 0.65  --  0.60 0.49  --  0.50  CALCIUM  9.2  --   --  8.3*  --  7.7*  MG  --   --   --   --  1.7 1.6*  PHOS  --   --   --   --   --  1.8*    GFR: Estimated Creatinine Clearance: 57.1 mL/min (by C-G formula based on SCr of 0.5 mg/dL).  Liver Function Tests: Recent Labs  Lab 05/24/24 1114 05/25/24 0529 05/26/24 0536  AST 26 22 22   ALT 15 11 11   ALKPHOS 94 73 59  BILITOT 1.4* 1.2 0.5  PROT 7.5 6.2* 5.4*  ALBUMIN 3.7 2.9* 2.6*    Coagulation Profile: Recent Labs  Lab 05/24/24 1114 05/25/24 0529  INR 1.2 1.3*    Thyroid  Function Tests: Recent Labs    05/25/24 0529  TSH 1.132  FREET4 0.69    Recent Results (from the past 240 hours)  Resp panel by RT-PCR (RSV, Flu A&B, Covid) Anterior Nasal Swab     Status: None   Collection Time: 05/24/24 11:14 AM   Specimen: Anterior Nasal Swab  Result Value Ref Range Status   SARS Coronavirus 2 by RT PCR NEGATIVE  NEGATIVE Final   Influenza A by PCR NEGATIVE NEGATIVE Final   Influenza B by PCR NEGATIVE NEGATIVE Final    Comment: (NOTE) The Xpert Xpress SARS-CoV-2/FLU/RSV plus assay is intended as an aid in the diagnosis of influenza from Nasopharyngeal swab specimens and should not be used as a sole basis for treatment. Nasal washings and aspirates are unacceptable for Xpert Xpress SARS-CoV-2/FLU/RSV testing.  Fact Sheet for Patients: bloggercourse.com  Fact Sheet for Healthcare Providers: seriousbroker.it  This test is not yet approved or cleared by the [place]  FDA and has been authorized for detection and/or diagnosis of SARS-CoV-2 by FDA under an Emergency Use Authorization (EUA). This EUA will remain in effect (meaning this test can be used) for the duration of the COVID-19 declaration under Section 564(b)(1) of the Act, 21 U.S.C. section 360bbb-3(b)(1), unless the authorization is terminated or revoked.     Resp Syncytial Virus by PCR NEGATIVE NEGATIVE Final    Comment: (NOTE) Fact Sheet for Patients: bloggercourse.com  Fact Sheet for Healthcare Providers: seriousbroker.it  This test is not yet approved or cleared by the [place]  FDA and has been authorized for detection and/or diagnosis of SARS-CoV-2 by FDA under an Emergency Use Authorization (EUA). This EUA will remain in effect (meaning this test can be used) for the duration of the COVID-19 declaration under Section 564(b)(1) of the Act, 21 U.S.C. section 360bbb-3(b)(1), unless the authorization is terminated or revoked.  Performed at [facility], [address], [place], [place] [address]   Blood Culture (routine x 2)     Status: None (Preliminary result)   Collection Time: 05/24/24 11:19 AM   Specimen: BLOOD LEFT HAND  Result Value Ref Range Status   Specimen Description BLOOD LEFT HAND  Final    Special Requests   Final  BOTTLES DRAWN AEROBIC AND ANAEROBIC Blood Culture adequate volume   Culture   Final    NO GROWTH 2 DAYS Performed at [facility], [address] [place], [place] [address]    Report Status PENDING  Incomplete  Blood Culture (routine x 2)     Status: None (Preliminary result)   Collection Time: 05/24/24 11:51 AM   Specimen: BLOOD  Result Value Ref Range Status   Specimen Description BLOOD SITE NOT SPECIFIED  Final   Special Requests   Final    BOTTLES DRAWN AEROBIC AND ANAEROBIC Blood Culture adequate volume   Culture   Final    NO GROWTH 2 DAYS Performed at [facility], [address] [place], [place] [address]    Report Status PENDING  Incomplete      Radiology Studies: CT PELVIS WO CONTRAST Result Date: 05/25/2024 EXAM: CT PELVIS, WITHOUT IV CONTRAST 05/25/2024 03:26:50 PM TECHNIQUE: Axial images were acquired through the pelvis without IV contrast. Reformatted images were reviewed. Automated exposure control, iterative reconstruction, and/or weight based adjustment of the mA/kV was utilized to reduce the radiation dose to as low as reasonably achievable. COMPARISON: Radiographs dated 05/24/2024 and CT chest abdomen and pelvis from 02/10/2022. CLINICAL HISTORY: right hip pain, poor historian, negative plain film. FINDINGS: BONES: Mixed sclerotic and lucent expansile lesion in the right sacral ala, similar to prior study. Expansile lucent lesions in the left acetabulum and right acetabulum. Expansile lucent lesion in the S2 vertebra. S2 vertebral changes appear progressed since prior CT chest abdomen and pelvis from 02/10/2022. This may indicate metastatic disease. Correlate with clinical history. Degenerative changes in the lower lumbar spine. No acute fracture. No new lesions. JOINTS: Small right hip effusion and bursal collection. No dislocation. The joint spaces are normal. SOFT TISSUES: The soft tissues are unremarkable. INTRAPELVIC CONTENTS:  Calcified fibroids in the uterus. Limited images of the intrapelvic contents demonstrate no acute abnormality. IMPRESSION: 1. Expansile lucent lesions in the left acetabulum, right acetabulum, S2 vertebra, and a mixed sclerotic and lucent expansile lesion in the right sacral ala, with progression of S2 changes compared to 02/10/2022, suspicious for metastatic disease. 2. Small right hip effusion and bursal collection. Electronically signed by: Elsie Gravely MD 05/25/2024 07:20 PM EST RP Workstation: [id number]   MR BRAIN W WO CONTRAST Result Date: 05/24/2024 EXAM: MRI BRAIN WITH AND WITHOUT CONTRAST 05/24/2024 07:54:38 PM TECHNIQUE: Multiplanar multisequence MRI of the head/brain was performed with and without the administration of intravenous contrast. COMPARISON: Same day CT head. CLINICAL HISTORY: Mental status change, unknown cause; AMS, h/o melanoma, abnormal CT head FINDINGS: BRAIN AND VENTRICLES: No acute infarct. The focus of hyperdensity seen on the same day CT head in the anterosuperior left insula has T2 hypointensity and susceptibility artifact, but no surrounding edema. No convincing acute hemorrhage. No visible associated enhancement. Additional scattered microhemorrhages are unchanged. No mass effect or midline shift. No hydrocephalus. Moderate patchy T2/FLAIR hyperintensities in the white matter nonspecific but compatible with chronic microvascular ischemic disease. Remote left posterior limb of the internal capsule infarct. Normal flow voids. No mass or abnormal enhancement. ORBITS: No acute abnormality. SINUSES: Opacified left maxillary sinus. BONES AND SOFT TISSUES: Normal bone marrow signal and enhancement. No acute soft tissue abnormality. IMPRESSION: 1. The focus of hyperdensity seen on the same day CT head in the anterosuperior left insula has no surrounding edema, which favors mineralization (possibly from prior hemorrhage or cavernous malformation) over acute hemorrhage. 2. 2.No acute  abnormality. 3. No abnormal  enhancement or mass. Electronically signed by: Gilmore Molt MD 05/24/2024 08:33 PM EST RP Workstation: [id number]   DG Abd Portable 1V Result Date: 05/24/2024 CLINICAL DATA:  MRI clearance. EXAM: PORTABLE ABDOMEN - 1 VIEW COMPARISON:  Abdominal radiograph dated 01/26/2018. FINDINGS: Evaluation is limited due to overlying support wires. No radiopaque foreign object noted. No bowel dilatation or evidence of obstruction. No acute osseous pathology. IMPRESSION: No radiopaque foreign object. Electronically Signed   By: Vanetta Chou M.D.   On: 05/24/2024 18:05   DG Pelvis Portable Result Date: 05/24/2024 CLINICAL DATA:  Right hip EXAM: PORTABLE PELVIS 1-2 VIEWS COMPARISON:  Radiographs dated 11/11/2022. FINDINGS: Evaluation is very limited due to positioning. No acute fracture noted. No dislocation. There is adduction of the right femur crossing over the lower pelvis and left femoral neck. This may represent a chronic finding with new since the prior radiograph. The soft tissues are grossly unremarkable IMPRESSION: 1. No acute fracture or dislocation. 2. Adduction of the right femur crossing over the lower pelvis and left femur. Electronically Signed   By: Vanetta Chou M.D.   On: 05/24/2024 17:10   CT Head Wo Contrast Result Date: 05/24/2024 EXAM: CT HEAD WITHOUT CONTRAST 05/24/2024 02:31:00 PM TECHNIQUE: CT of the head was performed without the administration of intravenous contrast. Automated exposure control, iterative reconstruction, and/or weight based adjustment of the mA/kV was utilized to reduce the radiation dose to as low as reasonably achievable. COMPARISON: CT head 01/08/2024 and MRI 11/03/2023. CLINICAL HISTORY: Mental status change, unknown cause. FINDINGS: BRAIN AND VENTRICLES: There is a 4 mm hyperdense focus at the anterosuperior aspect of the left insula (series 2 image 19 and series 5 image 38). A subtle punctate hyperdense focus was present in this  location on the prior CT, and no corresponding abnormality was evident on the previous MRI. There is no significant associated edema. No acute intracranial hemorrhage is identified elsewhere. No acute large territory infarct, midline shift, hydrocephalus, or extra-axial fluid collection is identified. There is mild cerebral atrophy. Confluent cerebral white matter hypodensities are similar to the prior CT and nonspecific but compatible with moderate to severe chronic small vessel ischemic disease. A small chronic right cerebellar infarct is noted. ORBITS: No acute abnormality. SINUSES: Chronic maxillary sinusitis. Small chronic bilateral mastoid effusions. SOFT TISSUES AND SKULL: No acute soft tissue abnormality. No skull fracture. IMPRESSION: 1. 4 mm hyperdense focus at the anterosuperior left insula without significant edema. While this may reflect a small acute parenchymal hemorrhage, subtle hyperdensity was present in this location on the prior CT and this may instead reflect progressive mineralization. 2. No other evidence of an acute intracranial abnormality. 3. Moderate to severe chronic small vessel ischemic disease. Electronically signed by: Dasie Hamburg MD 05/24/2024 02:55 PM EST RP Workstation: [id number]   DG Chest Port 1 View Result Date: 05/24/2024 CLINICAL DATA:  Weakness.  Lethargy.  Sepsis. EXAM: PORTABLE CHEST 1 VIEW COMPARISON:  01/08/2024 radiograph FINDINGS: The cardiac silhouette, mediastinal and hilar contours are within normal limits given the AP projection, portable technique and semi upright position of the patient. The lungs are clear of an acute process. No infiltrates, edema or effusions. No pulmonary lesions. No pneumothorax. The bony thorax is intact. IMPRESSION: No acute cardiopulmonary findings. Electronically Signed   By: MYRTIS Stammer M.D.   On: 05/24/2024 12:15       LOS: 1 day   Kiyaan Haq  [facility] Pager on [email or web address]  05/26/2024, 10:40 AM

## 2024-05-26 NOTE — Progress Notes (Signed)
 OT Cancellation Note  Patient Details Name: Kaitlyn Duncan MRN: [id number] DOB: Aug 10, 1970   Cancelled Treatment:    Reason Eval/Treat Not Completed: Patient at procedure or test/ unavailable (Pt curently off unit for imaging. OT to reattempt to see pt at a later time as appropriate/available.)  Margarie Rockey HERO., OTR/L, MA Acute Rehab [phone]   Margarie FORBES Horns 05/26/2024, 4:34 PM

## 2024-05-26 NOTE — Plan of Care (Signed)

## 2024-05-27 ENCOUNTER — Inpatient Hospital Stay (HOSPITAL_COMMUNITY): Payer: Medicare (Managed Care)

## 2024-05-27 DIAGNOSIS — F29 Unspecified psychosis not due to a substance or known physiological condition: Secondary | ICD-10-CM | POA: Diagnosis not present

## 2024-05-27 DIAGNOSIS — G9340 Encephalopathy, unspecified: Secondary | ICD-10-CM | POA: Diagnosis not present

## 2024-05-27 DIAGNOSIS — R4182 Altered mental status, unspecified: Secondary | ICD-10-CM | POA: Diagnosis not present

## 2024-05-27 DIAGNOSIS — C7951 Secondary malignant neoplasm of bone: Secondary | ICD-10-CM

## 2024-05-27 DIAGNOSIS — G893 Neoplasm related pain (acute) (chronic): Secondary | ICD-10-CM

## 2024-05-27 DIAGNOSIS — C439 Malignant melanoma of skin, unspecified: Secondary | ICD-10-CM

## 2024-05-27 DIAGNOSIS — D509 Iron deficiency anemia, unspecified: Secondary | ICD-10-CM | POA: Diagnosis not present

## 2024-05-27 LAB — LACTATE DEHYDROGENASE: LDH: 217 U/L (ref 105–235)

## 2024-05-27 MED ORDER — SODIUM CHLORIDE 0.9 % IV SOLN: INTRAVENOUS | Status: DC

## 2024-05-27 MED ORDER — ACETAMINOPHEN 325 MG PO TABS
650.0000 mg | ORAL_TABLET | Freq: Three times a day (TID) | ORAL | Status: DC
  Administered 2024-05-27 – 2024-05-28 (×5): 650 mg via ORAL
  Filled 2024-05-27 (×6): qty 2

## 2024-05-27 MED ORDER — ZOLEDRONIC ACID 4 MG/5ML IV CONC
4.0000 mg | Freq: Once | INTRAVENOUS | Status: AC
  Administered 2024-05-27: 4 mg via INTRAVENOUS
  Filled 2024-05-27: qty 5

## 2024-05-27 NOTE — Progress Notes (Signed)
 [facility] PROGRESS NOTE   Kaitlyn Duncan FMW:[id number] DOB: 1971-03-21 DOA: 05/24/2024  PCP: [name] LABOR, MD  Brief History: 53 y.o. female with a past medical history of malignant melanoma with brain metastasis previously currently in remission, history of schizophrenia with noncompliance, diagnosed with adrenal insufficiency in May 2025, who lives in a nursing facility.  Apparently she is a ward of the state.  Patient was sent from her nursing facility as she has not been her usual self for several days.  She has not been eating and drinking much.  Has been weaker than normal.  More lethargic.  Patient not able to provide any history at this time.  She does open her eyes briefly.  She groans and moans but unable to tell me where she hurts.  She was noted to have her right lower extremity internally rotated.  Upon trying to straighten the leg out she screamed out in pain.  No documented reports of any falls at the nursing facility.  Once again due to her altered mental status history is very limited.  She was hospitalized for further management and evaluation.  Consultants: Medical oncology.  Interventional radiology  Procedures: None    Subjective/Interval History: Patient with significant pain whenever right lower extremity is mobilized.  Otherwise she is comfortable.  Not very communicative.  States that she is hungry this morning.   Assessment/Plan:  Acute metabolic encephalopathy No clear etiology for her presentation except for profound dehydration.  MRI brain does not show any acute findings.  UA was abnormal but does not suggest infection.  No other infectious etiology noted.  WBC is normal.  Thrombocytopenia is chronic. Patient was started on IV hydration.  Mentation has gradually improved.  Baseline is unknown though according to nursing home reports she does get around using a walker.  Is able to eat her meals.   Seen by PT OT  SLP.  Dehydration Hydration status has improved.  Oral intake remains poor.  Continue with IV fluids at a lower rate.  Encourage oral intake.  Hypokalemia/hypomagnesemia/hypophosphatemia Supplemented.  Continue to monitor and supplement as indicated.    Normocytic anemia Drop in hemoglobin is dilutional.  No evidence of overt bleeding. Anemia panel shows ferritin of 116, iron 25, TIBC 157, percent saturation 16.  Folic acid 11.  B12 level 980.  Right sided pelvic pain most likely due to osseous metastases Call was placed to patient's nursing facility.  Discussed with nursing staff there.  Apparently patient was using a walker to ambulate until very recently.  So it appears that her right lower extremity pain is new.  Pelvic CT scan was done.  This revealed expansile lucent lesions in the left acetabulum, right acetabulum, S2 vertebrae and a mixed sclerotic and lucent expansile lesion in the right sacral ala with progression of S2 changes compared to 2023.  This raises concern for metastatic disease.  Small right hip effusion was noted. Patient subsequently underwent MRI thoracic and lumbar spine which raises concern for diffuse osseous metastases including in the right pelvic area.  This is the most likely reason for her pain. See below.  Thrombocytopenia This is chronic.  LDH is noted to be normal.  History of metastatic melanoma/osseous metastases Previously followed by Dr. Sherrod with the cancer center.  Previous brain metastases but none as of earlier this year.   MRI brain done during this admission also does not show any metastatic disease.  An area of hyperdense focus thought to be mineralization  possibly from prior hemorrhage or cavernous malformation.  No acute abnormalities noted. Pelvic CT raises concern for metastatic disease.  MRI of the thoracic and lumbar spine shows diffuse osseous metastases.  Oncology consulted.  They plan to do biopsy.   History of adrenal  insufficiency This was diagnosed in May 2025.  ACTH  level was noted to be low.  She had optimal response to ACTH  stimulation.   Apparently patient has not been taking her hydrocortisone .  Her electrolytes were normal.  Blood pressure was stable.  Continue to hold off on steroids for now. Cortisol levels were repeated during this admission and noted to be normal at 15.9.   TSH is normal.  Free T4 is normal.   ACTH  level is noted to be low at 6.7.  Significance is unclear in the setting of normal cortisol level.  History of schizophrenia Has been noncompliant with her medications.  When she was discharged back in May 2025 she was discharged off of all of her psychotropics after discussions with psychiatry.  Continue to hold off on psychotropic medications for now.   No behavioral issues noted here in the hospital so far.    Goals of care Apparently she is a ward of the state.  Seen by palliative care.  DVT Prophylaxis: SCDs Code Status: Full code Family Communication: No family at bedside Disposition Plan: Back to SNF when medically stable.  It appears that she is a ward of the state.    Medications: Scheduled:  Chlorhexidine  Gluconate Cloth  6 each Topical Daily   feeding supplement  237 mL Oral BID BM   mouth rinse  15 mL Mouth Rinse 4 times per day   pantoprazole   40 mg Oral Daily   Continuous:  sodium chloride  50 mL/hr at 05/27/24 1027   PRN:acetaminophen  **OR** acetaminophen , ibuprofen , LORazepam , naphazoline-glycerin , ondansetron  **OR** ondansetron  (ZOFRAN ) IV, mouth rinse    Objective:  Vital Signs  Vitals:   05/26/24 2022 05/26/24 2024 05/27/24 0417 05/27/24 0836  BP: 123/83  (!) 123/93 (!) 129/97  Pulse: 98 84 (!) 106 99  Resp:  18  20  Temp: 98.6 F (37 C) 99.1 F (37.3 C) 99.1 F (37.3 C) (!) 97.5 F (36.4 C)  TempSrc: Oral   Oral  SpO2:  99% 95% 99%  Weight:        Intake/Output Summary (Last 24 hours) at 05/27/2024 1042 Last data filed at 05/27/2024  0848 Gross per 24 hour  Intake 1160.01 ml  Output --  Net 1160.01 ml    General appearance: Awake alert.  In no distress.  Not very communicative Resp: Clear to auscultation bilaterally.  Normal effort Cardio: S1-S2 is normal regular.  No S3-S4.  No rubs murmurs or bruit GI: Abdomen is soft.  Nontender nondistended.  Bowel sounds are present normal.  No masses organomegaly Extremities: Continues to have significant limited range of motion of the right lower extremity.  A lot of pain.   Lab Results:  Data Reviewed: I have personally reviewed following labs and reports of the imaging studies  CBC: Recent Labs  Lab 05/24/24 1114 05/24/24 1204 05/24/24 1205 05/25/24 0529 05/26/24 0536  WBC 6.3  --   --  5.4 6.3  NEUTROABS 5.0  --   --   --   --   HGB 12.7 12.9 13.6 10.5* 10.0*  HCT 39.8 38.0 40.0 32.4* 30.4*  MCV 83.8  --   --  83.3 81.7  PLT 87*  --   --  73*  86*    Basic Metabolic Panel: Recent Labs  Lab 05/24/24 1114 05/24/24 1204 05/24/24 1205 05/25/24 0529 05/25/24 1036 05/26/24 0536  NA 135 136 136 135  --  133*  K 3.6 3.7 3.7 3.3*  --  3.6  CL 100  --  102 102  --  103  CO2 19*  --   --  20*  --  22  GLUCOSE 81  --  78 60*  --  97  BUN 32*  --  32* 18  --  9  CREATININE 0.65  --  0.60 0.49  --  0.50  CALCIUM  9.2  --   --  8.3*  --  7.7*  MG  --   --   --   --  1.7 1.6*  PHOS  --   --   --   --   --  1.8*    GFR: Estimated Creatinine Clearance: 57.1 mL/min (by C-G formula based on SCr of 0.5 mg/dL).  Liver Function Tests: Recent Labs  Lab 05/24/24 1114 05/25/24 0529 05/26/24 0536  AST 26 22 22   ALT 15 11 11   ALKPHOS 94 73 59  BILITOT 1.4* 1.2 0.5  PROT 7.5 6.2* 5.4*  ALBUMIN 3.7 2.9* 2.6*    Coagulation Profile: Recent Labs  Lab 05/24/24 1114 05/25/24 0529  INR 1.2 1.3*    Thyroid  Function Tests: Recent Labs    05/25/24 0529  TSH 1.132  FREET4 0.69    Recent Results (from the past 240 hours)  Resp panel by RT-PCR (RSV, Flu  A&B, Covid) Anterior Nasal Swab     Status: None   Collection Time: 05/24/24 11:14 AM   Specimen: Anterior Nasal Swab  Result Value Ref Range Status   SARS Coronavirus 2 by RT PCR NEGATIVE NEGATIVE Final   Influenza A by PCR NEGATIVE NEGATIVE Final   Influenza B by PCR NEGATIVE NEGATIVE Final    Comment: (NOTE) The Xpert Xpress SARS-CoV-2/FLU/RSV plus assay is intended as an aid in the diagnosis of influenza from Nasopharyngeal swab specimens and should not be used as a sole basis for treatment. Nasal washings and aspirates are unacceptable for Xpert Xpress SARS-CoV-2/FLU/RSV testing.  Fact Sheet for Patients: bloggercourse.com  Fact Sheet for Healthcare Providers: seriousbroker.it  This test is not yet approved or cleared by the [place]  FDA and has been authorized for detection and/or diagnosis of SARS-CoV-2 by FDA under an Emergency Use Authorization (EUA). This EUA will remain in effect (meaning this test can be used) for the duration of the COVID-19 declaration under Section 564(b)(1) of the Act, 21 U.S.C. section 360bbb-3(b)(1), unless the authorization is terminated or revoked.     Resp Syncytial Virus by PCR NEGATIVE NEGATIVE Final    Comment: (NOTE) Fact Sheet for Patients: bloggercourse.com  Fact Sheet for Healthcare Providers: seriousbroker.it  This test is not yet approved or cleared by the [place]  FDA and has been authorized for detection and/or diagnosis of SARS-CoV-2 by FDA under an Emergency Use Authorization (EUA). This EUA will remain in effect (meaning this test can be used) for the duration of the COVID-19 declaration under Section 564(b)(1) of the Act, 21 U.S.C. section 360bbb-3(b)(1), unless the authorization is terminated or revoked.  Performed at [facility], [address] [place], [place] [address]   Blood Culture (routine x 2)      Status: None (Preliminary result)   Collection Time: 05/24/24 11:19 AM   Specimen: BLOOD LEFT HAND  Result Value Ref  Range Status   Specimen Description BLOOD LEFT HAND  Final   Special Requests   Final    BOTTLES DRAWN AEROBIC AND ANAEROBIC Blood Culture adequate volume   Culture   Final    NO GROWTH 2 DAYS Performed at [facility], [address], [place], [place] [address]    Report Status PENDING  Incomplete  Blood Culture (routine x 2)     Status: None (Preliminary result)   Collection Time: 05/24/24 11:51 AM   Specimen: BLOOD  Result Value Ref Range Status   Specimen Description BLOOD SITE NOT SPECIFIED  Final   Special Requests   Final    BOTTLES DRAWN AEROBIC AND ANAEROBIC Blood Culture adequate volume   Culture   Final    NO GROWTH 2 DAYS Performed at [facility], [address] [place], [place] [address]    Report Status PENDING  Incomplete      Radiology Studies: MR Lumbar Spine W Wo Contrast Result Date: 05/26/2024 EXAM: MRI LUMBAR SPINE 05/26/2024 05:11:34 PM TECHNIQUE: Multiplanar multisequence MRI of the lumbar spine was performed with and without the administration of intravenous contrast. COMPARISON: None available. CLINICAL HISTORY: Metastatic disease evaluation. Metastatic melanoma. FINDINGS: BONES AND ALIGNMENT: Levoconvex curvature is centered at L1-L2. Multiple enhancing lesions are present within the T12 vertebral body. An enhancing lesion in the posterior central L2 vertebral body measures 10 x 7 mm. A metastatic lesion involving the right lateral aspect of S1 and S2 extends into the right iliac bone measuring at least 2.1 x 2.3 cm. Hemangiomas are present along the superior endplate of L3 and L4. Normal vertebral body heights are maintained. SPINAL CORD: The conus terminates normally. SOFT TISSUES: No paraspinal mass. T12-L1: No significant disc herniation. No spinal canal stenosis or neural foraminal narrowing. L1-L2: Broad-based disc  protrusion. No significant stenosis is present. No neural foraminal narrowing. L2-L3: No significant disc herniation. No spinal canal stenosis or neural foraminal narrowing. L3-L4: Moderate facet hypertrophy is present bilaterally. No focal disc protrusion or stenosis is present. No neural foraminal narrowing. L4-L5: Broad-based disc protrusion. Moderate facet hypertrophy is present. This results in moderate right foraminal stenosis. No spinal canal stenosis. L5-S1: No significant disc herniation. No spinal canal stenosis or neural foraminal narrowing. IMPRESSION: 1. Osseous metastases involving T12, L2, and the right lateral S1S2 with extension into the right iliac bone, consistent with metastatic melanoma. 2. Broad-based disc protrusion at L1-2 without significant stenosis. 3. Moderate facet hypertrophy bilaterally at L3-4 without focal disc protrusion or stenosis. 4. Broad-based disc protrusion and moderate facet hypertrophy at L4-5 resulting in moderate right foraminal stenosis. Electronically signed by: Lonni Necessary MD 05/26/2024 05:33 PM EST RP Workstation: [id number]   MR THORACIC SPINE W WO CONTRAST Result Date: 05/26/2024 EXAM: MRI THORACIC SPINE WITH AND WITHOUT INTRAVENOUS CONTRAST 05/26/2024 05:10:45 PM TECHNIQUE: Multiplanar multisequence MRI of the thoracic spine was performed with and without the administration of 5 mL gadobutrol  (GADAVIST ) 1 MMOL/ML injection. COMPARISON: None available. CLINICAL HISTORY: Metastatic melanoma. FINDINGS: BONES AND ALIGNMENT: Normal alignment. Normal vertebral body heights. Multiple foci of T2 marrow hyperintensity and enhancement are present throughout the thoracic spine. Lesion in the posterior right aspect of the T5 vertebral body measures 9 x 7 mm. A lesion in the anterior superior aspect of the T7 vertebral body measures 15 x 14 mm. Lesion anteriorly and inferiorly at T9 measures 10 x 10 mm. Lesion in the posterior inferior central T10 vertebral body  measures 7.5 mm. Multiple lesions are  present within the T12 vertebral body taking up much of the marrow space in the vertebral body. No pathologic fractures are present. SPINAL CORD: Normal spinal cord volume. Normal spinal cord signal. SOFT TISSUES: Unremarkable. DEGENERATIVE CHANGES: No significant disc herniation. No spinal canal stenosis or neural foraminal narrowing. IMPRESSION: 1. Multiple enhancing T2-hyperintense marrow lesions throughout the thoracic spine, consistent with osseous metastases. 2. No pathologic fractures. Electronically signed by: Lonni Necessary MD 05/26/2024 05:19 PM EST RP Workstation: [id number]   CT PELVIS WO CONTRAST Result Date: 05/25/2024 EXAM: CT PELVIS, WITHOUT IV CONTRAST 05/25/2024 03:26:50 PM TECHNIQUE: Axial images were acquired through the pelvis without IV contrast. Reformatted images were reviewed. Automated exposure control, iterative reconstruction, and/or weight based adjustment of the mA/kV was utilized to reduce the radiation dose to as low as reasonably achievable. COMPARISON: Radiographs dated 05/24/2024 and CT chest abdomen and pelvis from 02/10/2022. CLINICAL HISTORY: right hip pain, poor historian, negative plain film. FINDINGS: BONES: Mixed sclerotic and lucent expansile lesion in the right sacral ala, similar to prior study. Expansile lucent lesions in the left acetabulum and right acetabulum. Expansile lucent lesion in the S2 vertebra. S2 vertebral changes appear progressed since prior CT chest abdomen and pelvis from 02/10/2022. This may indicate metastatic disease. Correlate with clinical history. Degenerative changes in the lower lumbar spine. No acute fracture. No new lesions. JOINTS: Small right hip effusion and bursal collection. No dislocation. The joint spaces are normal. SOFT TISSUES: The soft tissues are unremarkable. INTRAPELVIC CONTENTS: Calcified fibroids in the uterus. Limited images of the intrapelvic contents demonstrate no acute  abnormality. IMPRESSION: 1. Expansile lucent lesions in the left acetabulum, right acetabulum, S2 vertebra, and a mixed sclerotic and lucent expansile lesion in the right sacral ala, with progression of S2 changes compared to 02/10/2022, suspicious for metastatic disease. 2. Small right hip effusion and bursal collection. Electronically signed by: Elsie Gravely MD 05/25/2024 07:20 PM EST RP Workstation: [id number]       LOS: 2 days   Joette Pebbles  [facility] Pager on [email or web address]  05/27/2024, 10:42 AM

## 2024-05-27 NOTE — Evaluation (Signed)
 Occupational Therapy Evaluation Patient Details Name: Kaitlyn Duncan MRN: [id number] DOB: 1971/03/21 Today's Date: 05/27/2024   History of Present Illness   Pt is a 53 y.o. female who presented 05/24/24 from her nursing facility with lethargy, weakness, and poor oral intake. Pt admitted with acute metabolic encephalopathy, hypokalemia, and dehydration. Imaging of pelvis negative for fx. MRI brain negative for acute intracranial abnormality. MRI lumbar spine and pelvis revealed: T12, L2, S1, R & L acetabular osseous mets. PMH includes: anemia, melanoma with mets to brain with whole brain radiation currently in remission, schizophrenia with noncompliance, adrenal insufficiency in May 2025, CVA.     Clinical Impressions Pt received in supine, RN present at bedside. Pt presents with flat affect, delayed processing, pleasantly confused. She is a poor historian and unable to provide PLOF information. Per prior admission therapy notes, pt was ambulatory with RW in May 2025. She resides at a SNF. Reports indep with grooming and self-feeding. She presents today with generalized weakness and RLE guarding (suspected 2/2 pain based off facial expressions). Followed 1-step commands inconsistently with incr time for processing. Total A for LB ADLs and setup/CGA for bed level UB ADLs. Deferred EOB 2/2 high pain levels.  Pt is currently functioning below baseline and would benefit from ongoing acute OT services to progress towards safe discharge and to facilitate return to prior level of function. Current recommendation is post-acute rehab (< 3 hours/day).     If plan is discharge home, recommend the following:   Two people to help with walking and/or transfers;Two people to help with bathing/dressing/bathroom;Assistance with cooking/housework;Direct supervision/assist for medications management;Direct supervision/assist for financial management;Assist for transportation;Supervision due to cognitive  status     Functional Status Assessment   Patient has had a recent decline in their functional status and demonstrates the ability to make significant improvements in function in a reasonable and predictable amount of time.     Equipment Recommendations   Other (comment) (defer)     Recommendations for Other Services         Precautions/Restrictions   Precautions Precautions: Fall Recall of Precautions/Restrictions: Impaired Restrictions Weight Bearing Restrictions Per Provider Order: No     Mobility Bed Mobility               General bed mobility comments: not assessed 2/2 high pain levels, per chart pt with new bone mets to lower spine and pelvis    Transfers                   General transfer comment: NT      Balance       Sitting balance - Comments: NT 2/2 high pain, suspect deficits given pt's presentation in bed (trunk shifted to the R and RLE flexed and hip in IR).                                   ADL either performed or assessed with clinical judgement   ADL Overall ADL's : Needs assistance/impaired Eating/Feeding: Minimal assistance;Bed level   Grooming: Contact guard assist;Bed level;Wash/dry face   Upper Body Bathing: Minimal assistance   Lower Body Bathing: Maximal assistance   Upper Body Dressing : Minimal assistance   Lower Body Dressing: Total assistance       Toileting- Clothing Manipulation and Hygiene: Total assistance               Vision Baseline Vision/History:  (no  eye glasses present at time of OT eval)       Perception         Praxis         Pertinent Vitals/Pain Pain Assessment Pain Assessment: Faces Faces Pain Scale: Hurts even more Pain Location: RLE Pain Descriptors / Indicators: Discomfort, Grimacing, Guarding, Moaning Pain Intervention(s): Limited activity within patient's tolerance, Monitored during session, Repositioned, RN gave pain meds during session      Extremity/Trunk Assessment Upper Extremity Assessment Upper Extremity Assessment: Generalized weakness   Lower Extremity Assessment Lower Extremity Assessment: Defer to PT evaluation       Communication Communication Communication: Impaired Factors Affecting Communication: Reduced clarity of speech   Cognition Arousal: Alert Behavior During Therapy: Flat affect Cognition: History of cognitive impairments             OT - Cognition Comments: delayed processing                 Following commands: Impaired Following commands impaired: Follows one step commands inconsistently, Follows one step commands with increased time     Cueing  General Comments   Cueing Techniques: Verbal cues;Tactile cues  pt observed with LLE extended, hip in neutral; RLE with hip/knee flexed and IR (pt resisting therapist to attempt to straighten RLE), placed pillow between knees to prevent skin breakdown/irritation; overall pleasant & participatory   Exercises     Shoulder Instructions      Home Living Family/patient expects to be discharged to:: Skilled nursing facility                                 Additional Comments: Per chart, pt is a rersident at a nursing facility      Prior Functioning/Environment Prior Level of Function : Patient poor historian/Family not available             Mobility Comments: Per notes in May 2025, pt was +1 for ambulation with RW. ADLs Comments: pt reports indep with self-feeding and grooming    OT Problem List: Decreased strength;Decreased range of motion;Impaired balance (sitting and/or standing);Decreased cognition;Impaired UE functional use;Pain;Decreased safety awareness   OT Treatment/Interventions: Self-care/ADL training;Therapeutic exercise;DME and/or AE instruction;Therapeutic activities;Cognitive remediation/compensation;Balance training;Patient/family education      OT Goals(Current goals can be found in the  care plan section)   Acute Rehab OT Goals Patient Stated Goal: did not state goal OT Goal Formulation: With patient Time For Goal Achievement: 06/10/24 Potential to Achieve Goals: Fair   OT Frequency:  Min 1X/week    Co-evaluation              AM-PAC OT 6 Clicks Daily Activity     Outcome Measure Help from another person eating meals?: A Little Help from another person taking care of personal grooming?: A Little Help from another person toileting, which includes using toliet, bedpan, or urinal?: Total Help from another person bathing (including washing, rinsing, drying)?: A Lot Help from another person to put on and taking off regular upper body clothing?: A Little Help from another person to put on and taking off regular lower body clothing?: Total 6 Click Score: 13   End of Session Nurse Communication: Other (comment) (pt status)  Activity Tolerance: Patient limited by pain;Patient tolerated treatment well Patient left: in bed;with call bell/phone within reach;with bed alarm set  OT Visit Diagnosis: Unsteadiness on feet (R26.81);Muscle weakness (generalized) (M62.81);Other symptoms and signs involving cognitive function;Pain Pain -  Right/Left: Right Pain - part of body: Leg                Time: 8757-8743 OT Time Calculation (min): 14 min Charges:  OT General Charges $OT Visit: 1 Visit OT Evaluation $OT Eval Low Complexity: 1 Low  [employer] Severin M. Burma, OTR/L [facility] Acute Rehabilitation Services [phone] Secure Chat Preferred  Rikki Burma 05/27/2024, 2:26 PM

## 2024-05-27 NOTE — Consult Note (Signed)
 Kaitlyn Duncan is a 53 year old white female.  She is a ward of the state.  She has cognitive issues.  I really cannot get her to talk to me this morning.  She apparently has a history of metastatic melanoma.  She has been treated in the past by Dr. Amadeo, who is no longer in [facility].  She had melanoma that was BRAF mutated.  She was treated with Mekinist/[name] .  I think she also received some immunotherapy.  The last time that she saw Dr. She was in 2023.  She was just on surveillance.  She lives in a group home.  She was admitted on 05/24/2024 with decreased oral intake weakness and lethargy.  She had a change in her mental status.  She had a workup.  When she came in, her sodium 135.  Potassium 3.3.  BUN 18 creatinine 0.49.  Calcium  8.3.  Blood sugar 60.  Her albumin 2.9.  LFTs were normal.  She was somewhat iron deficient.  Her CBC shows a white cell count of  6.3.  Hemoglobin 12.7.  Platelet count 87,000.  She had MRI of the brain when she came in which was unremarkable..  She had a MRI of the thoracic and lumbar spine.  This showed osseous lesions which were considered to be metastatic disease.  Cultures have been taken.  I will go and she may have a urinary tract infection.  She had a CT of the pelvis on 05/25/2024.  This also showed osseous metastasis.  She is quite frail.  She, again, was not talking to me.  She did not appear to be in any kind of pain.  I would have to believe this is recurrent melanoma.  She has had no treatment for probably 2-1/2 years.  However, I think we are going to have to get a biopsy to prove that this is melanoma.  Looks like her disease appears to be confined to the bones.  Hopefully, IR will be able to get a biopsy for us .  Clearly, we will have to talk to her legal guardian as to what can be done.  I think if we confirm the diagnosis, then we will have to see what options are available.  She apparently had a very nice response in the past 2  anti-BRAF therapy.  I suppose this could always be considered again since has been probably close to 3 years since she had BRAF therapy.   Her vital signs show temperature 99.1.  Pulse 106.  Blood pressure 123/93.  Her head neck exam shows no obvious adenopathy in the neck.  She has a has good breath sounds bilaterally.  Cardiac exam slightly tachycardic but regular.  Abdomen is soft.  There is no palpable liver or spleen tip.  Extremities shows no obvious swelling.  Again, actively this is recurrent melanoma.  I am little surprised that it did not come back to the brain.  I would have thought that since she had initial presentation in the brain that this would be the most likely side of recurrence.  Again we will go ahead to get a biopsy.  I do feel bad for her.  I know that she is a has had a very challenging life.  I will definitely stay strong in prayer for her.  Jeralyn Crease, MD  [place] 1:5

## 2024-05-27 NOTE — Plan of Care (Signed)

## 2024-05-27 NOTE — Progress Notes (Signed)
 Patient ID: Kaitlyn Duncan, female   DOB: 01-27-1971, 53 y.o.   MRN: [id number]  IR received request for bony lesion biopsy. Reviewed case and imaging with IR attending Dr. Philip, who also reviewed scans with neuro radiologists. Sacral lesions are noted to be present on scans back to 2019, representing likely treated metastasis. Other new areas of the spine concerning for metastasis are not easily accessible for biopsy. IR recommendation is for PET scan prior to reconsideration of bony lesions biopsy. No planned inpatient biopsy at this time.  Please reach out to IR with any questions or concerns.   Kaitlyn DEL Ieshia Hatcher PA-C 05/27/2024 11:39 AM

## 2024-05-28 ENCOUNTER — Inpatient Hospital Stay (HOSPITAL_COMMUNITY): Payer: Medicare (Managed Care)

## 2024-05-28 DIAGNOSIS — C7951 Secondary malignant neoplasm of bone: Secondary | ICD-10-CM | POA: Diagnosis not present

## 2024-05-28 DIAGNOSIS — G893 Neoplasm related pain (acute) (chronic): Secondary | ICD-10-CM | POA: Diagnosis not present

## 2024-05-28 DIAGNOSIS — G9340 Encephalopathy, unspecified: Secondary | ICD-10-CM | POA: Diagnosis not present

## 2024-05-28 DIAGNOSIS — F29 Unspecified psychosis not due to a substance or known physiological condition: Secondary | ICD-10-CM | POA: Diagnosis not present

## 2024-05-28 LAB — COMPREHENSIVE METABOLIC PANEL WITH GFR
ALT: 12 U/L (ref 0–44)
AST: 20 U/L (ref 15–41)
Albumin: 2.4 g/dL — ABNORMAL LOW (ref 3.5–5.0)
Alkaline Phosphatase: 59 U/L (ref 38–126)
Anion gap: 3 — ABNORMAL LOW (ref 5–15)
BUN: 12 mg/dL (ref 6–20)
CO2: 27 mmol/L (ref 22–32)
Calcium: 7.6 mg/dL — ABNORMAL LOW (ref 8.9–10.3)
Chloride: 102 mmol/L (ref 98–111)
Creatinine, Ser: 0.48 mg/dL (ref 0.44–1.00)
GFR, Estimated: >60 mL/min (ref >60)
Glucose, Bld: 74 mg/dL (ref 70–99)
Potassium: 3.7 mmol/L (ref 3.5–5.1)
Sodium: 132 mmol/L — ABNORMAL LOW (ref 135–145)
Total Bilirubin: 0.4 mg/dL (ref 0.0–1.2)
Total Protein: 5.2 g/dL — ABNORMAL LOW (ref 6.5–8.1)

## 2024-05-28 LAB — CBC WITH DIFFERENTIAL/PLATELET
Abs Immature Granulocytes: 0.01 K/uL (ref 0.00–0.07)
Basophils Absolute: 0 K/uL (ref 0.0–0.1)
Basophils Relative: 0 %
Eosinophils Absolute: 0 K/uL (ref 0.0–0.5)
Eosinophils Relative: 0 %
HCT: 28.5 % — ABNORMAL LOW (ref 36.0–46.0)
Hemoglobin: 9.5 g/dL — ABNORMAL LOW (ref 12.0–15.0)
Immature Granulocytes: 0 %
Lymphocytes Relative: 22 %
Lymphs Abs: 0.8 K/uL (ref 0.7–4.0)
MCH: 27.3 pg (ref 26.0–34.0)
MCHC: 33.3 g/dL (ref 30.0–36.0)
MCV: 81.9 fL (ref 80.0–100.0)
Monocytes Absolute: 0.3 K/uL (ref 0.1–1.0)
Monocytes Relative: 9 %
Neutro Abs: 2.4 K/uL (ref 1.7–7.7)
Neutrophils Relative %: 69 %
Platelets: 109 K/uL — ABNORMAL LOW (ref 150–400)
RBC: 3.48 MIL/uL — ABNORMAL LOW (ref 3.87–5.11)
RDW: 12.7 % (ref 11.5–15.5)
WBC: 3.5 K/uL — ABNORMAL LOW (ref 4.0–10.5)
nRBC: 0 % (ref 0.0–0.2)

## 2024-05-28 LAB — PHOSPHORUS: Phosphorus: 3.1 mg/dL (ref 2.5–4.6)

## 2024-05-28 LAB — PREALBUMIN: Prealbumin: <5 mg/dL — ABNORMAL LOW (ref 18–38)

## 2024-05-28 LAB — LACTATE DEHYDROGENASE: LDH: 180 U/L (ref 105–235)

## 2024-05-28 LAB — MAGNESIUM: Magnesium: 1.6 mg/dL — ABNORMAL LOW (ref 1.7–2.4)

## 2024-05-28 MED ORDER — MAGNESIUM SULFATE 4 GM/100ML IV SOLN
4.0000 g | Freq: Once | INTRAVENOUS | Status: AC
  Administered 2024-05-28: 4 g via INTRAVENOUS
  Filled 2024-05-28: qty 100

## 2024-05-28 MED ORDER — SODIUM CHLORIDE 0.9 % IV SOLN: INTRAVENOUS | Status: AC

## 2024-05-28 MED ORDER — IOHEXOL 350 MG/ML SOLN
75.0000 mL | Freq: Once | INTRAVENOUS | Status: AC | PRN
  Administered 2024-05-28: 75 mL via INTRAVENOUS

## 2024-05-28 NOTE — Progress Notes (Signed)
 [facility] PROGRESS NOTE   Kaitlyn Duncan FMW:[id number] DOB: 07/31/1970 DOA: 05/24/2024  PCP: [name] LABOR, MD  Brief History: 53 y.o. female with a past medical history of malignant melanoma with brain metastasis previously currently in remission, history of schizophrenia with noncompliance, diagnosed with adrenal insufficiency in May 2025, who lives in a nursing facility.  Apparently she is a ward of the state.  Patient was sent from her nursing facility as she has not been her usual self for several days.  She has not been eating and drinking much.  Has been weaker than normal.  More lethargic.  Patient not able to provide any history at this time.  She does open her eyes briefly.  She groans and moans but unable to tell me where she hurts.  She was noted to have her right lower extremity internally rotated.  Upon trying to straighten the leg out she screamed out in pain.  No documented reports of any falls at the nursing facility.  Once again due to her altered mental status history is very limited.  She was hospitalized for further management and evaluation.  Consultants: Medical oncology.  Interventional radiology  Procedures: None    Subjective/Interval History: Patient lying on the bed.  Seems to be comfortable.  Still somewhat distracted and is a poor historian.  Continues to have pain with movement of her left lower extremity.  Denies any headaches.   Assessment/Plan:  Acute metabolic encephalopathy No clear etiology for her presentation except for profound dehydration.  MRI brain does not show any acute findings.  UA was abnormal but does not suggest infection.  No other infectious etiology noted.  WBC is normal.  Thrombocytopenia is chronic. Patient was started on IV hydration.  Mentation has gradually improved.  Baseline is unknown though according to nursing home reports she does get around using a walker.  Is able to eat her meals.   Seen by PT OT  SLP.  Dehydration Hydration status has improved.  Oral intake remains poor.  Continue with IV fluids at a lower rate.  Encourage oral intake.  Hypokalemia/hypomagnesemia/hypophosphatemia/hyponatremia Potassium, phosphorus levels have improved.  Continue to supplement magnesium .  Monitor sodium levels.  There could be an element of SIADH.  Normocytic anemia Drop in hemoglobin is dilutional.  No evidence of overt bleeding. Anemia panel shows ferritin of 116, iron 25, TIBC 157, percent saturation 16.  Folic acid 11.  B12 level 980.  Right sided pelvic pain most likely due to osseous metastases Call was placed to patient's nursing facility.  Discussed with nursing staff there.  Apparently patient was using a walker to ambulate until very recently.  So it appears that her right lower extremity pain is new.  Pelvic CT scan was done.  This revealed expansile lucent lesions in the left acetabulum, right acetabulum, S2 vertebrae and a mixed sclerotic and lucent expansile lesion in the right sacral ala with progression of S2 changes compared to 2023.  This raises concern for metastatic disease.  Small right hip effusion was noted. Patient subsequently underwent MRI thoracic and lumbar spine which raises concern for diffuse osseous metastases including in the right pelvic area.  This is the most likely reason for her pain. Continue scheduled Tylenol .  PT OT.  Thrombocytopenia This is chronic.  LDH is noted to be normal.  History of metastatic melanoma/osseous metastases Previously followed by Dr. Sherrod with the cancer center.  Previous brain metastases but none as of earlier this year.  MRI brain done during this admission also does not show any metastatic disease.  An area of hyperdense focus thought to be mineralization possibly from prior hemorrhage or cavernous malformation.  No acute abnormalities noted. Pelvic CT raises concern for metastatic disease.  MRI of the thoracic and lumbar spine shows  diffuse osseous metastases.   Seen by medical oncology who recommended biopsy.  Patient was evaluated by IR.  They are not convinced that the lesions are new.  They recommend doing a PET scan before pursuing biopsy. This will have to be pursued in the outpatient setting. A CT scan of the chest abdomen pelvis ordered by medical oncology does not reveal any soft tissue metastatic disease in the chest.  Bilateral adrenal nodules showed decrease in size.  As seen on previous imaging studies lucent/lytic lesions seen in the thoracic lumbar spine and bony pelvis.  Other nonspecific findings were noted.  Cholelithiasis was noted.   LDH is normal.  Await further oncology input.  History of adrenal insufficiency This was diagnosed in May 2025.  ACTH  level was noted to be low.  She had optimal response to ACTH  stimulation.   Apparently patient has not been taking her hydrocortisone .  Her electrolytes were normal.  Blood pressure was stable.  Continue to hold off on steroids for now. Cortisol levels were repeated during this admission and noted to be normal at 15.9.   TSH is normal.  Free T4 is normal.   ACTH  level is noted to be low at 6.7.  Significance is unclear in the setting of normal cortisol level.  MRI brain was reviewed with radiology and they did not see any obvious evidence for pituitary metastases.  Patient may benefit from being evaluated by endocrinology.  This will need to be pursued in the outpatient setting.  History of schizophrenia Has been noncompliant with her medications.  When she was discharged back in May 2025 she was discharged off of all of her psychotropics after discussions with psychiatry.  Continue to hold off on psychotropic medications for now.   No behavioral issues noted here in the hospital so far.    Goals of care Apparently she is a ward of the state.  Seen by palliative care.  DVT Prophylaxis: SCDs Code Status: Full code Family Communication: No family at  bedside Disposition Plan: Back to SNF when medically stable.  It appears that she is a ward of the state.    Medications: Scheduled:  acetaminophen   650 mg Oral TID   Chlorhexidine  Gluconate Cloth  6 each Topical Daily   feeding supplement  237 mL Oral BID BM   mouth rinse  15 mL Mouth Rinse 4 times per day   pantoprazole   40 mg Oral Daily   Continuous:  sodium chloride  50 mL/hr at 05/27/24 1839   magnesium  sulfate bolus IVPB     PRN:acetaminophen  **OR** acetaminophen , ibuprofen , LORazepam , naphazoline-glycerin , ondansetron  **OR** ondansetron  (ZOFRAN ) IV, mouth rinse    Objective:  Vital Signs  Vitals:   05/27/24 1559 05/27/24 2031 05/28/24 0017 05/28/24 0538  BP: 121/86 127/72 136/83 121/86  Pulse: (!) 101 (!) 101 (!) 102 91  Resp: 20 18  18   Temp: 98.4 F (36.9 C) 98.5 F (36.9 C) 98.2 F (36.8 C) 98.2 F (36.8 C)  TempSrc: Oral Oral  Oral  SpO2: 99% 98% 97% 97%  Weight:        Intake/Output Summary (Last 24 hours) at 05/28/2024 9096 Last data filed at 05/27/2024 1839 Gross per 24 hour  Intake 1013.77 ml  Output 1300 ml  Net -286.23 ml    General appearance: Awake alert.  In no distress Resp: Clear to auscultation bilaterally.  Normal effort Cardio: S1-S2 is normal regular.  No S3-S4.  No rubs murmurs or bruit GI: Abdomen is soft.  Nontender nondistended.  Bowel sounds are present normal.  No masses organomegaly Extremities: Significant restriction in range of motion of the left lower extremity due to pain.  Right lower extremity movement is better but also restricted due to pain. No obvious focal neurological deficits.   Lab Results:  Data Reviewed: I have personally reviewed following labs and reports of the imaging studies  CBC: Recent Labs  Lab 05/24/24 1114 05/24/24 1204 05/24/24 1205 05/25/24 0529 05/26/24 0536 05/28/24 0524  WBC 6.3  --   --  5.4 6.3 3.5*  NEUTROABS 5.0  --   --   --   --  2.4  HGB 12.7 12.9 13.6 10.5* 10.0* 9.5*  HCT  39.8 38.0 40.0 32.4* 30.4* 28.5*  MCV 83.8  --   --  83.3 81.7 81.9  PLT 87*  --   --  73* 86* 109*    Basic Metabolic Panel: Recent Labs  Lab 05/24/24 1114 05/24/24 1204 05/24/24 1205 05/25/24 0529 05/25/24 1036 05/26/24 0536 05/28/24 0524  NA 135 136 136 135  --  133* 132*  K 3.6 3.7 3.7 3.3*  --  3.6 3.7  CL 100  --  102 102  --  103 102  CO2 19*  --   --  20*  --  22 27  GLUCOSE 81  --  78 60*  --  97 74  BUN 32*  --  32* 18  --  9 12  CREATININE 0.65  --  0.60 0.49  --  0.50 0.48  CALCIUM  9.2  --   --  8.3*  --  7.7* 7.6*  MG  --   --   --   --  1.7 1.6* 1.6*  PHOS  --   --   --   --   --  1.8* 3.1    GFR: Estimated Creatinine Clearance: 57.1 mL/min (by C-G formula based on SCr of 0.48 mg/dL).  Liver Function Tests: Recent Labs  Lab 05/24/24 1114 05/25/24 0529 05/26/24 0536 05/28/24 0524  AST 26 22 22 20   ALT 15 11 11 12   ALKPHOS 94 73 59 59  BILITOT 1.4* 1.2 0.5 0.4  PROT 7.5 6.2* 5.4* 5.2*  ALBUMIN 3.7 2.9* 2.6* 2.4*    Coagulation Profile: Recent Labs  Lab 05/24/24 1114 05/25/24 0529  INR 1.2 1.3*    Recent Results (from the past 240 hours)  Resp panel by RT-PCR (RSV, Flu A&B, Covid) Anterior Nasal Swab     Status: None   Collection Time: 05/24/24 11:14 AM   Specimen: Anterior Nasal Swab  Result Value Ref Range Status   SARS Coronavirus 2 by RT PCR NEGATIVE NEGATIVE Final   Influenza A by PCR NEGATIVE NEGATIVE Final   Influenza B by PCR NEGATIVE NEGATIVE Final    Comment: (NOTE) The Xpert Xpress SARS-CoV-2/FLU/RSV plus assay is intended as an aid in the diagnosis of influenza from Nasopharyngeal swab specimens and should not be used as a sole basis for treatment. Nasal washings and aspirates are unacceptable for Xpert Xpress SARS-CoV-2/FLU/RSV testing.  Fact Sheet for Patients: bloggercourse.com  Fact Sheet for Healthcare Providers: seriousbroker.it  This test is not yet approved or  cleared by the [place]  [place] FDA and has been authorized for detection and/or diagnosis of SARS-CoV-2 by FDA under an Emergency Use Authorization (EUA). This EUA will remain in effect (meaning this test can be used) for the duration of the COVID-19 declaration under Section 564(b)(1) of the Act, 21 U.S.C. section 360bbb-3(b)(1), unless the authorization is terminated or revoked.     Resp Syncytial Virus by PCR NEGATIVE NEGATIVE Final    Comment: (NOTE) Fact Sheet for Patients: bloggercourse.com  Fact Sheet for Healthcare Providers: seriousbroker.it  This test is not yet approved or cleared by the [place]  FDA and has been authorized for detection and/or diagnosis of SARS-CoV-2 by FDA under an Emergency Use Authorization (EUA). This EUA will remain in effect (meaning this test can be used) for the duration of the COVID-19 declaration under Section 564(b)(1) of the Act, 21 U.S.C. section 360bbb-3(b)(1), unless the authorization is terminated or revoked.  Performed at [facility], [address], [place], [place] [address]   Blood Culture (routine x 2)     Status: None (Preliminary result)   Collection Time: 05/24/24 11:19 AM   Specimen: BLOOD LEFT HAND  Result Value Ref Range Status   Specimen Description BLOOD LEFT HAND  Final   Special Requests   Final    BOTTLES DRAWN AEROBIC AND ANAEROBIC Blood Culture adequate volume   Culture   Final    NO GROWTH 4 DAYS Performed at [facility], [address] [place], [place] [address]    Report Status PENDING  Incomplete  Blood Culture (routine x 2)     Status: None (Preliminary result)   Collection Time: 05/24/24 11:51 AM   Specimen: BLOOD  Result Value Ref Range Status   Specimen Description BLOOD SITE NOT SPECIFIED  Final   Special Requests   Final    BOTTLES DRAWN AEROBIC AND ANAEROBIC Blood Culture adequate volume   Culture   Final    NO GROWTH 4  DAYS Performed at [facility], [address], [place], [place] [address]    Report Status PENDING  Incomplete      Radiology Studies: CT CHEST ABDOMEN PELVIS W CONTRAST Result Date: 05/28/2024 CLINICAL DATA:  Skin cancer.  Staging.  * Tracking Code: BO * EXAM: CT CHEST, ABDOMEN, AND PELVIS WITH CONTRAST TECHNIQUE: Multidetector CT imaging of the chest, abdomen and pelvis was performed following the standard protocol during bolus administration of intravenous contrast. RADIATION DOSE REDUCTION: This exam was performed according to the departmental dose-optimization program which includes automated exposure control, adjustment of the mA and/or kV according to patient size and/or use of iterative reconstruction technique. CONTRAST:  75mL OMNIPAQUE  IOHEXOL  350 MG/ML SOLN COMPARISON:  Chest abdomen pelvis CT 02/10/2022. FINDINGS: CT CHEST FINDINGS Cardiovascular: The heart size is normal. No substantial pericardial effusion. No thoracic aortic aneurysm. No substantial atherosclerotic calcification of the thoracic aorta. Mediastinum/Nodes: No mediastinal lymphadenopathy. There is no hilar lymphadenopathy. The esophagus has normal imaging features. There is no axillary lymphadenopathy. Lungs/Pleura: Dependent atelectasis noted in both lungs. No suspicious pulmonary nodule or mass. Trace bilateral pleural effusions evident. Musculoskeletal: Subtle lucent thoracic spine lesions, better characterized on recent thoracic spine MRI. CT ABDOMEN PELVIS FINDINGS Hepatobiliary: 8 mm hypervascular lesion the caudate lobe is stable since the 2023 exam, likely benign and potentially flash filling hemangioma. Suspicious focal abnormality within the liver parenchyma. Calcified gallstones evident. No intrahepatic or extrahepatic biliary dilation. Pancreas: No focal mass lesion. No dilatation of the main duct. No intraparenchymal cyst. No peripancreatic  edema. Spleen: No splenomegaly. No suspicious focal mass lesion. Tiny  flash filling lesion seen a on the 2023 exam is subtle on the current study but visible on image 49/series 3, likely hemangioma. Adrenals/Urinary Tract: R 8 mm left adrenal nodule was 12 mm previously. 10 mm right adrenal nodule was 12 mm previously. Kidneys unremarkable. No evidence for hydroureter. Bladder is distended. Stomach/Bowel: Stomach is unremarkable. No gastric wall thickening. No evidence of outlet obstruction. Duodenum is normally positioned as is the ligament of Treitz. No small bowel wall thickening. No small bowel dilatation. The terminal ileum is normal. The appendix is not well visualized, but there is no edema or inflammation in the region of the cecal tip to suggest appendicitis. No gross colonic mass. No colonic wall thickening. Vascular/Lymphatic: No abdominal aortic aneurysm. No abdominal aortic atherosclerotic calcification. There is no gastrohepatic or hepatoduodenal ligament lymphadenopathy. No retroperitoneal or mesenteric lymphadenopathy. No pelvic sidewall lymphadenopathy. Reproductive: Calcified uterine fibroids evident. There is no adnexal mass. Other: Subtle fluid/edema seen posterior to the right psoas muscle and posterior to the right colon/cecum, indeterminate. Trace free fluid is seen in the pelvis. Musculoskeletal: As on previous recent CT imaging, lucent/lytic lesions are seen in the lumbar spine and bony pelvis. Asymmetric subcutaneous edema noted right buttocks region and left anterior hip region. IMPRESSION: 1. No evidence for soft tissue metastatic disease in the chest. 2. Interval decrease in size of bilateral adrenal nodules. 3. As on previous recent imaging, lucent/lytic lesions are seen in the thoracic and lumbar spine and bony pelvis, suspicious for metastatic disease. 4. Subtle fluid/edema seen posterior to the right psoas muscle and posterior to the right colon/cecum, indeterminate. Trace free fluid is seen in the pelvis. 5. Cholelithiasis. 6. Asymmetric subcutaneous  edema noted right buttocks region and left anterior hip region. Electronically Signed   By: Camellia Candle M.D.   On: 05/28/2024 06:11   MR Lumbar Spine W Wo Contrast Addendum Date: 05/27/2024 * ADDENDUM #1 *ADDENDUM: The right sacral lesion is present on multiple prior CT scans of the pelvis as far back as 01/26/2018 most likely still represents an osseous metastasis, although it may be a treated metastasis. The bulk of the signal change at T12 represents an atypical hemangioma. A 12 mm enhancing lesion is present in the right superior posterior aspect of the vertebral body medially adjacent to the pedicle is consistent with a focal metastasis. ---------------------------------------------------- Electronically signed by: Lonni Necessary MD 05/27/2024 11:22 AM EST RP Workstation: [id number]   Result Date: 05/27/2024 * ORIGINAL REPORT * EXAM: MRI LUMBAR SPINE 05/26/2024 05:11:34 PM TECHNIQUE: Multiplanar multisequence MRI of the lumbar spine was performed with and without the administration of intravenous contrast. COMPARISON: None available. CLINICAL HISTORY: Metastatic disease evaluation. Metastatic melanoma. FINDINGS: BONES AND ALIGNMENT: Levoconvex curvature is centered at L1-L2. Multiple enhancing lesions are present within the T12 vertebral body. An enhancing lesion in the posterior central L2 vertebral body measures 10 x 7 mm. A metastatic lesion involving the right lateral aspect of S1 and S2 extends into the right iliac bone measuring at least 2.1 x 2.3 cm. Hemangiomas are present along the superior endplate of L3 and L4. Normal vertebral body heights are maintained. SPINAL CORD: The conus terminates normally. SOFT TISSUES: No paraspinal mass. T12-L1: No significant disc herniation. No spinal canal stenosis or neural foraminal narrowing. L1-L2: Broad-based disc protrusion. No significant stenosis is present. No neural foraminal narrowing. L2-L3: No significant disc herniation. No spinal canal  stenosis or neural foraminal narrowing. L3-L4: Moderate facet  hypertrophy is present bilaterally. No focal disc protrusion or stenosis is present. No neural foraminal narrowing. L4-L5: Broad-based disc protrusion. Moderate facet hypertrophy is present. This results in moderate right foraminal stenosis. No spinal canal stenosis. L5-S1: No significant disc herniation. No spinal canal stenosis or neural foraminal narrowing. IMPRESSION: 1. Osseous metastases involving T12, L2, and the right lateral S1S2 with extension into the right iliac bone, consistent with metastatic melanoma. 2. Broad-based disc protrusion at L1-2 without significant stenosis. 3. Moderate facet hypertrophy bilaterally at L3-4 without focal disc protrusion or stenosis. 4. Broad-based disc protrusion and moderate facet hypertrophy at L4-5 resulting in moderate right foraminal stenosis. Electronically signed by: Lonni Necessary MD 05/26/2024 05:33 PM EST RP Workstation: [id number]   MR THORACIC SPINE W WO CONTRAST Addendum Date: 05/27/2024 *ADDENDUM #1 ** ADDENDUM: A complex hemangioma is present at T12, making up the bulk of the signal abnormality within the vertebral body. Enhancing metastasis is present in the posterior superior aspect of T12 measuring 12 mm adjacent to the right pedicle. ---------------------------------------------------- Electronically signed by: Lonni Necessary MD 05/27/2024 11:05 AM EST RP Workstation: [id number]   Result Date: 05/27/2024 *ORIGINAL REPORT * EXAM: MRI THORACIC SPINE WITH AND WITHOUT INTRAVENOUS CONTRAST 05/26/2024 05:10:45 PM TECHNIQUE: Multiplanar multisequence MRI of the thoracic spine was performed with and without the administration of 5 mL gadobutrol  (GADAVIST ) 1 MMOL/ML injection. COMPARISON: None available. CLINICAL HISTORY: Metastatic melanoma. FINDINGS: BONES AND ALIGNMENT: Normal alignment. Normal vertebral body heights. Multiple foci of T2 marrow hyperintensity and enhancement are  present throughout the thoracic spine. Lesion in the posterior right aspect of the T5 vertebral body measures 9 x 7 mm. A lesion in the anterior superior aspect of the T7 vertebral body measures 15 x 14 mm. Lesion anteriorly and inferiorly at T9 measures 10 x 10 mm. Lesion in the posterior inferior central T10 vertebral body measures 7.5 mm. Multiple lesions are present within the T12 vertebral body taking up much of the marrow space in the vertebral body. No pathologic fractures are present. SPINAL CORD: Normal spinal cord volume. Normal spinal cord signal. SOFT TISSUES: Unremarkable. DEGENERATIVE CHANGES: No significant disc herniation. No spinal canal stenosis or neural foraminal narrowing. IMPRESSION: 1. Multiple enhancing T2-hyperintense marrow lesions throughout the thoracic spine, consistent with osseous metastases. 2. No pathologic fractures. Electronically signed by: Lonni Necessary MD 05/26/2024 05:19 PM EST RP Workstation: [id number]       LOS: 3 days   Joette Pebbles  [facility] Pager on [email or web address]  05/28/2024, 9:03 AM

## 2024-05-28 NOTE — Progress Notes (Signed)
   05/28/24 0908  Mobility  Activity Dangled on edge of bed  Level of Assistance  (MinA to ModA for sitting balance. Total A for bed mobility.)  Assistive Device Front wheel walker  Activity Response Tolerated fair  Mobility Referral Yes  Mobility visit 1 Mobility  Mobility Specialist Start Time (ACUTE ONLY) 0908  Mobility Specialist Stop Time (ACUTE ONLY) [id number]  Mobility Specialist Time Calculation (min) (ACUTE ONLY) 20 min   Mobility Specialist: Progress Note   Pt agreeable to mobility session - received in bed. C/o RLE pain with RLE contracted. Returned to chair position in bed with all needs met - call bell within reach. Bed alarm on.   Additional comments: Pt sat EOB, eating on her own, leaning to right side was able to self correct 50% of the time.   Virgle Boards, BS Mobility Specialist Please contact via SecureChat or  Rehab office at [phone].

## 2024-05-28 NOTE — Plan of Care (Signed)
   Problem: Education: Goal: Knowledge of General Education information will improve Description: Including pain rating scale, medication(s)/side effects and non-pharmacologic comfort measures Outcome: Progressing   Problem: Health Behavior/Discharge Planning: Goal: Ability to manage health-related needs will improve Outcome: Progressing   Problem: Nutrition: Goal: Adequate nutrition will be maintained Outcome: Progressing

## 2024-05-28 NOTE — Progress Notes (Signed)
 Thankfully, Kaitlyn Duncan is more alert this morning.  I was able to talk to her a little bit.  Was happy about this.  She had a CT of the chest and abdomen yesterday.  Results not back yet.  I thinks can be very difficult to get any kind of biopsy with her in the hospital.  IR has looked at everything.  I suppose out of push came to shove that we could just treat her with the assumption that this is her melanoma that has come back.  I would like to check a prealbumin on her.  It is hard to say how well she is eating.  She has had no fever.  Thankfully, cultures are negative.   Her vital signs are all stable.  Blood pressure 121/86.  Overall, there is nothing on her physical exam that is changed.  At some point, I had to talk to her legal guardian.  It has been I think 2-1/2 years that she has had any treatment.  I am happy that she is more alert today.  I think that we probably could initiate therapy on her.  Unfortunately, it would have to be as an outpatient since immunotherapy is not done in the hospital.  Kaitlyn Crease, MD  [place] 7:7-8

## 2024-05-28 NOTE — Plan of Care (Signed)

## 2024-05-29 DIAGNOSIS — G9340 Encephalopathy, unspecified: Secondary | ICD-10-CM | POA: Diagnosis not present

## 2024-05-29 LAB — CBC WITH DIFFERENTIAL/PLATELET
Abs Immature Granulocytes: 0.03 K/uL (ref 0.00–0.07)
Basophils Absolute: 0 K/uL (ref 0.0–0.1)
Basophils Relative: 0 %
Eosinophils Absolute: 0 K/uL (ref 0.0–0.5)
Eosinophils Relative: 0 %
HCT: 27.7 % — ABNORMAL LOW (ref 36.0–46.0)
Hemoglobin: 9.2 g/dL — ABNORMAL LOW (ref 12.0–15.0)
Immature Granulocytes: 1 %
Lymphocytes Relative: 11 %
Lymphs Abs: 0.7 K/uL (ref 0.7–4.0)
MCH: 27.1 pg (ref 26.0–34.0)
MCHC: 33.2 g/dL (ref 30.0–36.0)
MCV: 81.7 fL (ref 80.0–100.0)
Monocytes Absolute: 0.4 K/uL (ref 0.1–1.0)
Monocytes Relative: 7 %
Neutro Abs: 5.2 K/uL (ref 1.7–7.7)
Neutrophils Relative %: 81 %
Platelets: 122 K/uL — ABNORMAL LOW (ref 150–400)
RBC: 3.39 MIL/uL — ABNORMAL LOW (ref 3.87–5.11)
RDW: 13 % (ref 11.5–15.5)
WBC: 6.3 K/uL (ref 4.0–10.5)
nRBC: 0 % (ref 0.0–0.2)

## 2024-05-29 LAB — COMPREHENSIVE METABOLIC PANEL WITH GFR
ALT: 13 U/L (ref 0–44)
AST: 25 U/L (ref 15–41)
Albumin: 2.3 g/dL — ABNORMAL LOW (ref 3.5–5.0)
Alkaline Phosphatase: 64 U/L (ref 38–126)
Anion gap: 12 (ref 5–15)
BUN: 15 mg/dL (ref 6–20)
CO2: 19 mmol/L — ABNORMAL LOW (ref 22–32)
Calcium: 7.4 mg/dL — ABNORMAL LOW (ref 8.9–10.3)
Chloride: 99 mmol/L (ref 98–111)
Creatinine, Ser: 0.49 mg/dL (ref 0.44–1.00)
GFR, Estimated: >60 mL/min (ref >60)
Glucose, Bld: 149 mg/dL — ABNORMAL HIGH (ref 70–99)
Potassium: 4 mmol/L (ref 3.5–5.1)
Sodium: 130 mmol/L — ABNORMAL LOW (ref 135–145)
Total Bilirubin: 0.3 mg/dL (ref 0.0–1.2)
Total Protein: 5.3 g/dL — ABNORMAL LOW (ref 6.5–8.1)

## 2024-05-29 LAB — CULTURE, BLOOD (ROUTINE X 2)
Culture: NO GROWTH
Culture: NO GROWTH
Special Requests: ADEQUATE
Special Requests: ADEQUATE

## 2024-05-29 LAB — MAGNESIUM: Magnesium: 2 mg/dL (ref 1.7–2.4)

## 2024-05-29 MED ORDER — PANTOPRAZOLE SODIUM 40 MG PO TBEC: 40.0000 mg | DELAYED_RELEASE_TABLET | Freq: Every day | ORAL | Status: AC

## 2024-05-29 MED ORDER — ACETAMINOPHEN 325 MG PO TABS: 650.0000 mg | ORAL_TABLET | Freq: Three times a day (TID) | ORAL | Status: AC

## 2024-05-29 MED ORDER — IBUPROFEN 600 MG PO TABS: 600.0000 mg | ORAL_TABLET | Freq: Four times a day (QID) | ORAL | Status: AC | PRN

## 2024-05-29 MED ORDER — TRAMADOL HCL 50 MG PO TABS: 50.0000 mg | ORAL_TABLET | Freq: Four times a day (QID) | ORAL | 0 refills | Status: AC | PRN

## 2024-05-29 NOTE — Discharge Summary (Signed)
 [facility]  Physician Discharge Summary   Patient ID: Kaitlyn Duncan MRN: [id number] DOB/AGE: [birth year] 53 y.o.  Admit date: 05/24/2024 Discharge date:   05/29/2024   PCP: [name] LABOR, MD  DISCHARGE DIAGNOSES:    Melanoma of skin [facility]) Metastatic cancer   Schizophrenia spectrum disorder with psychotic disorder type not yet determined (HCC)   Dehydration, resolved   Acute metabolic encephalopathy, improved   RECOMMENDATIONS FOR OUTPATIENT FOLLOW UP: Palliative care to see at SNF and have conversations with legal guardian regarding transitioning to hospice.  See below for details   Home Health: SNF Equipment/Devices: None  CODE STATUS: Full code  DISCHARGE CONDITION: fair  Diet recommendation: Regular as tolerated  INITIAL HISTORY: 53 y.o. female with a past medical history of malignant melanoma with brain metastasis previously currently in remission, history of schizophrenia with noncompliance, diagnosed with adrenal insufficiency in May 2025, who lives in a nursing facility.  Apparently she is a ward of the state.  Patient was sent from her nursing facility as she has not been her usual self for several days.  She has not been eating and drinking much.  Has been weaker than normal.  More lethargic.  Patient not able to provide any history at this time.  She does open her eyes briefly.  She groans and moans but unable to tell me where she hurts.  She was noted to have her right lower extremity internally rotated.  Upon trying to straighten the leg out she screamed out in pain.  No documented reports of any falls at the nursing facility.  Once again due to her altered mental status history is very limited.  She was hospitalized for further management and evaluation.   Consultants: Medical oncology.  Interventional radiology   Procedures: None  HOSPITAL COURSE:   Acute metabolic encephalopathy No clear etiology for her presentation except for  profound dehydration.  MRI brain does not show any acute findings.  UA was abnormal but does not suggest infection.  No other infectious etiology noted.  WBC is normal.  Thrombocytopenia is chronic. Patient was started on IV hydration.  Mentation has gradually improved.  Baseline is unknown though according to nursing home reports she does get around using a walker.  Is able to eat her meals.   Seen by PT OT SLP.   Dehydration Hydration status has improved.  Encourage oral intake.   Hypokalemia/hypomagnesemia/hypophosphatemia/hyponatremia Potassium, phosphorus levels have improved.   There could be an element of SIADH.   Normocytic anemia Drop in hemoglobin is dilutional.  No evidence of overt bleeding. Anemia panel shows ferritin of 116, iron 25, TIBC 157, percent saturation 16.  Folic acid 11.  B12 level 980.   Right sided pelvic pain most likely due to osseous metastases Call was placed to patient's nursing facility.  Discussed with nursing staff there.  Apparently patient was using a walker to ambulate until very recently.  So it appears that her right lower extremity pain is new.  Pelvic CT scan was done.  This revealed expansile lucent lesions in the left acetabulum, right acetabulum, S2 vertebrae and a mixed sclerotic and lucent expansile lesion in the right sacral ala with progression of S2 changes compared to 2023.  This raises concern for metastatic disease.  Small right hip effusion was noted. Patient subsequently underwent MRI thoracic and lumbar spine which raises concern for diffuse osseous metastases including in the right pelvic area.  This is the most likely reason for her pain. Tramadol  as  needed.  Tylenol  on a scheduled basis.   Thrombocytopenia This is chronic.  LDH is noted to be normal.   History of metastatic melanoma/osseous metastases Previously followed by Dr. Sherrod with the cancer center.  Previous brain metastases but none as of earlier this year.   MRI brain  done during this admission also does not show any metastatic disease.  An area of hyperdense focus thought to be mineralization possibly from prior hemorrhage or cavernous malformation.  No acute abnormalities noted. Pelvic CT raises concern for metastatic disease.  MRI of the thoracic and lumbar spine shows diffuse osseous metastases.  Patient was seen by Dr. Timmy who recommended biopsy.  Patient was evaluated by IR.  They are not convinced that the lesions are new.  They recommend doing a PET scan before pursuing biopsy. This will have to be pursued in the outpatient setting. A CT scan of the chest abdomen pelvis ordered by medical oncology does not reveal any soft tissue metastatic disease in the chest.  Bilateral adrenal nodules showed decrease in size.  As seen on previous imaging studies lucent/lytic lesions seen in the thoracic lumbar spine and bony pelvis.  Other nonspecific findings were noted.  Cholelithiasis was noted.   LDH is normal.  Patient's prealbumin level is less than 5.  On medical oncology's opinion is that patient has very poor functional quality of life.  With her preop pain being at low she will not tolerate any treatment options.  They recommend consideration of hospice.  Patient is a ward of the state.  Has a legal guardian.  This can be pursued by palliative care at her skilled nursing facility.  She is currently eating her meals.  Pain seems to be reasonably well-controlled.   History of adrenal insufficiency This was diagnosed in May 2025.  ACTH  level was noted to be low.  She had optimal response to ACTH  stimulation.   Apparently patient has not been taking her hydrocortisone .  Her electrolytes were normal.  Blood pressure was stable.  Cortisol levels were repeated during this admission and noted to be normal at 15.9.   TSH is normal.  Free T4 is normal.   ACTH  level is noted to be low at 6.7.  Significance is unclear in the setting of normal cortisol level.  MRI brain  was reviewed with radiology and they did not see any obvious evidence for pituitary metastases.  Ideally patient will need to be evaluated by endocrinology but since her overall long-term prognosis is poor this can be deferred.  This is a difficult situation.  At this time would recommend continuing with the hydrocortisone  until goals of care can be established.   History of schizophrenia Has been noncompliant with her medications.  When she was discharged back in May 2025 she was discharged off of all of her psychotropics after discussions with psychiatry.  Continue to hold off on psychotropic medications for now.   No behavioral issues noted here in the hospital so far.     Goals of care Apparently she is a ward of the state.  Palliative care to see at SNF to consider transitioning to hospice.  Patient is stable.  Okay for discharge back to SNF.  PERTINENT LABS:  The results of significant diagnostics from this hospitalization (including imaging, microbiology, ancillary and laboratory) are listed below for reference.    Microbiology: Recent Results (from the past 240 hours)  Resp panel by RT-PCR (RSV, Flu A&B, Covid) Anterior Nasal Swab  Status: None   Collection Time: 05/24/24 11:14 AM   Specimen: Anterior Nasal Swab  Result Value Ref Range Status   SARS Coronavirus 2 by RT PCR NEGATIVE NEGATIVE Final   Influenza A by PCR NEGATIVE NEGATIVE Final   Influenza B by PCR NEGATIVE NEGATIVE Final    Comment: (NOTE) The Xpert Xpress SARS-CoV-2/FLU/RSV plus assay is intended as an aid in the diagnosis of influenza from Nasopharyngeal swab specimens and should not be used as a sole basis for treatment. Nasal washings and aspirates are unacceptable for Xpert Xpress SARS-CoV-2/FLU/RSV testing.  Fact Sheet for Patients: bloggercourse.com  Fact Sheet for Healthcare Providers: seriousbroker.it  This test is not yet approved or cleared by  the [place]  FDA and has been authorized for detection and/or diagnosis of SARS-CoV-2 by FDA under an Emergency Use Authorization (EUA). This EUA will remain in effect (meaning this test can be used) for the duration of the COVID-19 declaration under Section 564(b)(1) of the Act, 21 U.S.C. section 360bbb-3(b)(1), unless the authorization is terminated or revoked.     Resp Syncytial Virus by PCR NEGATIVE NEGATIVE Final    Comment: (NOTE) Fact Sheet for Patients: bloggercourse.com  Fact Sheet for Healthcare Providers: seriousbroker.it  This test is not yet approved or cleared by the [place]  FDA and has been authorized for detection and/or diagnosis of SARS-CoV-2 by FDA under an Emergency Use Authorization (EUA). This EUA will remain in effect (meaning this test can be used) for the duration of the COVID-19 declaration under Section 564(b)(1) of the Act, 21 U.S.C. section 360bbb-3(b)(1), unless the authorization is terminated or revoked.  Performed at [facility], [address] [place], [place] [address]   Blood Culture (routine x 2)     Status: None   Collection Time: 05/24/24 11:19 AM   Specimen: BLOOD LEFT HAND  Result Value Ref Range Status   Specimen Description BLOOD LEFT HAND  Final   Special Requests   Final    BOTTLES DRAWN AEROBIC AND ANAEROBIC Blood Culture adequate volume   Culture   Final    NO GROWTH 5 DAYS Performed at [facility], [address], [place], [place] [address]    Report Status 05/29/2024 FINAL  Final  Blood Culture (routine x 2)     Status: None   Collection Time: 05/24/24 11:51 AM   Specimen: BLOOD  Result Value Ref Range Status   Specimen Description BLOOD SITE NOT SPECIFIED  Final   Special Requests   Final    BOTTLES DRAWN AEROBIC AND ANAEROBIC Blood Culture adequate volume   Culture   Final    NO GROWTH 5 DAYS Performed at [facility], [address], [place], [place] [address]    Report Status 05/29/2024 FINAL  Final     Labs:   Basic Metabolic Panel: Recent Labs  Lab 05/24/24 1114 05/24/24 1204 05/24/24 1205 05/25/24 0529 05/25/24 1036 05/26/24 0536 05/28/24 0524 05/29/24 0418  NA 135   < > 136 135  --  133* 132* 130*  K 3.6   < > 3.7 3.3*  --  3.6 3.7 4.0  CL 100  --  102 102  --  103 102 99  CO2 19*  --   --  20*  --  22 27 19*  GLUCOSE 81  --  78 60*  --  97 74 149*  BUN 32*  --  32* 18  --  9 12 15   CREATININE 0.65  --  0.60 0.49  --  0.50 0.48 0.49  CALCIUM  9.2  --   --  8.3*  --  7.7* 7.6* 7.4*  MG  --   --   --   --  1.7 1.6* 1.6* 2.0  PHOS  --   --   --   --   --  1.8* 3.1  --    < > = values in this interval not displayed.   Liver Function Tests: Recent Labs  Lab 05/24/24 1114 05/25/24 0529 05/26/24 0536 05/28/24 0524 05/29/24 0418  AST 26 22 22 20 25   ALT 15 11 11 12 13   ALKPHOS 94 73 59 59 64  BILITOT 1.4* 1.2 0.5 0.4 0.3  PROT 7.5 6.2* 5.4* 5.2* 5.3*  ALBUMIN 3.7 2.9* 2.6* 2.4* 2.3*   CBC: Recent Labs  Lab 05/24/24 1114 05/24/24 1204 05/24/24 1205 05/25/24 0529 05/26/24 0536 05/28/24 0524 05/29/24 0418  WBC 6.3  --   --  5.4 6.3 3.5* 6.3  NEUTROABS 5.0  --   --   --   --  2.4 5.2  HGB 12.7   < > 13.6 10.5* 10.0* 9.5* 9.2*  HCT 39.8   < > 40.0 32.4* 30.4* 28.5* 27.7*  MCV 83.8  --   --  83.3 81.7 81.9 81.7  PLT 87*  --   --  73* 86* 109* 122*   < > = values in this interval not displayed.    IMAGING STUDIES  CT CHEST ABDOMEN PELVIS W CONTRAST Result Date: 05/28/2024 CLINICAL DATA:  Skin cancer.  Staging.  * Tracking Code: BO * EXAM: CT CHEST, ABDOMEN, AND PELVIS WITH CONTRAST TECHNIQUE: Multidetector CT imaging of the chest, abdomen and pelvis was performed following the standard protocol during bolus administration of intravenous contrast. RADIATION DOSE REDUCTION: This exam was performed according to the departmental dose-optimization program which includes automated exposure  control, adjustment of the mA and/or kV according to patient size and/or use of iterative reconstruction technique. CONTRAST:  75mL OMNIPAQUE  IOHEXOL  350 MG/ML SOLN COMPARISON:  Chest abdomen pelvis CT 02/10/2022. FINDINGS: CT CHEST FINDINGS Cardiovascular: The heart size is normal. No substantial pericardial effusion. No thoracic aortic aneurysm. No substantial atherosclerotic calcification of the thoracic aorta. Mediastinum/Nodes: No mediastinal lymphadenopathy. There is no hilar lymphadenopathy. The esophagus has normal imaging features. There is no axillary lymphadenopathy. Lungs/Pleura: Dependent atelectasis noted in both lungs. No suspicious pulmonary nodule or mass. Trace bilateral pleural effusions evident. Musculoskeletal: Subtle lucent thoracic spine lesions, better characterized on recent thoracic spine MRI. CT ABDOMEN PELVIS FINDINGS Hepatobiliary: 8 mm hypervascular lesion the caudate lobe is stable since the 2023 exam, likely benign and potentially flash filling hemangioma. Suspicious focal abnormality within the liver parenchyma. Calcified gallstones evident. No intrahepatic or extrahepatic biliary dilation. Pancreas: No focal mass lesion. No dilatation of the main duct. No intraparenchymal cyst. No peripancreatic edema. Spleen: No splenomegaly. No suspicious focal mass lesion. Tiny flash filling lesion seen a on the 2023 exam is subtle on the current study but visible on image 49/series 3, likely hemangioma. Adrenals/Urinary Tract: R 8 mm left adrenal nodule was 12 mm previously. 10 mm right adrenal nodule was 12 mm previously. Kidneys unremarkable. No evidence for hydroureter. Bladder is distended. Stomach/Bowel: Stomach is unremarkable. No gastric wall thickening. No evidence of outlet obstruction. Duodenum is normally positioned as is the ligament of Treitz. No small bowel wall thickening. No small bowel dilatation. The terminal ileum is normal. The appendix is not well visualized, but there is no  edema or inflammation in the region of the cecal tip to suggest appendicitis. No gross colonic mass. No colonic wall thickening. Vascular/Lymphatic: No abdominal aortic aneurysm. No abdominal aortic atherosclerotic calcification. There is no gastrohepatic or hepatoduodenal ligament lymphadenopathy. No retroperitoneal or mesenteric lymphadenopathy. No pelvic sidewall lymphadenopathy. Reproductive: Calcified uterine fibroids evident. There is no adnexal mass. Other: Subtle fluid/edema seen posterior to the right psoas muscle and posterior to the right colon/cecum, indeterminate. Trace free fluid is seen in the pelvis. Musculoskeletal: As on previous recent CT imaging, lucent/lytic lesions are seen in the lumbar spine and bony pelvis. Asymmetric subcutaneous edema noted right buttocks region and left anterior hip region. IMPRESSION: 1. No evidence for soft tissue metastatic disease in the chest. 2. Interval decrease in size of bilateral adrenal nodules. 3. As on previous recent imaging, lucent/lytic lesions are seen in the thoracic and lumbar spine and bony pelvis, suspicious for metastatic disease. 4. Subtle fluid/edema seen posterior to the right psoas muscle and posterior to the right colon/cecum, indeterminate. Trace free fluid is seen in the pelvis. 5. Cholelithiasis. 6. Asymmetric subcutaneous edema noted right buttocks region and left anterior hip region. Electronically Signed   By: Camellia Candle M.D.   On: 05/28/2024 06:11    MR Lumbar Spine W Wo Contrast Addendum Date: 05/27/2024 * ADDENDUM #1 *ADDENDUM: The right sacral lesion is present on multiple prior CT scans of the pelvis as far back as 01/26/2018 most likely still represents an osseous metastasis, although it may be a treated metastasis. The bulk of the signal change at T12 represents an atypical hemangioma. A 12 mm enhancing lesion is present in the right superior posterior aspect of the vertebral body medially adjacent to the pedicle is  consistent with a focal metastasis. ---------------------------------------------------- Electronically signed by: Lonni Necessary MD 05/27/2024 11:22 AM EST RP Workstation: [id number]    Result Date: 05/27/2024 * ORIGINAL REPORT * EXAM: MRI LUMBAR SPINE 05/26/2024 05:11:34 PM TECHNIQUE: Multiplanar multisequence MRI of the lumbar spine was performed with and without the administration of intravenous contrast. COMPARISON: None available. CLINICAL HISTORY: Metastatic disease evaluation. Metastatic melanoma. FINDINGS: BONES AND ALIGNMENT: Levoconvex curvature is centered at L1-L2. Multiple enhancing lesions are present within the T12 vertebral body. An enhancing lesion in the posterior central L2 vertebral body measures 10 x 7 mm. A metastatic lesion involving the right lateral aspect of S1 and S2 extends into the right iliac bone measuring at least 2.1 x 2.3 cm. Hemangiomas are present along the superior endplate of L3 and L4. Normal vertebral body heights are maintained. SPINAL CORD: The conus terminates normally. SOFT TISSUES: No paraspinal mass. T12-L1: No significant disc herniation. No spinal canal stenosis or neural foraminal narrowing. L1-L2: Broad-based disc protrusion. No significant stenosis is present. No neural foraminal narrowing. L2-L3: No significant disc herniation. No spinal canal stenosis or neural foraminal narrowing. L3-L4: Moderate facet hypertrophy is present bilaterally. No focal disc protrusion or stenosis is present. No neural foraminal narrowing. L4-L5: Broad-based disc protrusion. Moderate facet hypertrophy is present. This results in moderate right foraminal stenosis. No spinal canal stenosis. L5-S1: No significant disc herniation. No spinal canal stenosis or neural foraminal narrowing. IMPRESSION: 1. Osseous metastases involving T12, L2, and the right lateral S1S2 with extension into the right iliac bone, consistent with metastatic melanoma. 2. Broad-based disc protrusion at L1-2  without significant stenosis. 3. Moderate facet hypertrophy bilaterally at L3-4 without focal disc protrusion or stenosis. 4. Broad-based disc protrusion and moderate facet hypertrophy at L4-5 resulting in moderate right foraminal  stenosis. Electronically signed by: Lonni Necessary MD 05/26/2024 05:33 PM EST RP Workstation: [id number]    MR THORACIC SPINE W WO CONTRAST Addendum Date: 05/27/2024 *ADDENDUM #1 ** ADDENDUM: A complex hemangioma is present at T12, making up the bulk of the signal abnormality within the vertebral body. Enhancing metastasis is present in the posterior superior aspect of T12 measuring 12 mm adjacent to the right pedicle. ---------------------------------------------------- Electronically signed by: Lonni Necessary MD 05/27/2024 11:05 AM EST RP Workstation: [id number]    Result Date: 05/27/2024 *ORIGINAL REPORT * EXAM: MRI THORACIC SPINE WITH AND WITHOUT INTRAVENOUS CONTRAST 05/26/2024 05:10:45 PM TECHNIQUE: Multiplanar multisequence MRI of the thoracic spine was performed with and without the administration of 5 mL gadobutrol  (GADAVIST ) 1 MMOL/ML injection. COMPARISON: None available. CLINICAL HISTORY: Metastatic melanoma. FINDINGS: BONES AND ALIGNMENT: Normal alignment. Normal vertebral body heights. Multiple foci of T2 marrow hyperintensity and enhancement are present throughout the thoracic spine. Lesion in the posterior right aspect of the T5 vertebral body measures 9 x 7 mm. A lesion in the anterior superior aspect of the T7 vertebral body measures 15 x 14 mm. Lesion anteriorly and inferiorly at T9 measures 10 x 10 mm. Lesion in the posterior inferior central T10 vertebral body measures 7.5 mm. Multiple lesions are present within the T12 vertebral body taking up much of the marrow space in the vertebral body. No pathologic fractures are present. SPINAL CORD: Normal spinal cord volume. Normal spinal cord signal. SOFT TISSUES: Unremarkable. DEGENERATIVE CHANGES: No  significant disc herniation. No spinal canal stenosis or neural foraminal narrowing. IMPRESSION: 1. Multiple enhancing T2-hyperintense marrow lesions throughout the thoracic spine, consistent with osseous metastases. 2. No pathologic fractures. Electronically signed by: Lonni Necessary MD 05/26/2024 05:19 PM EST RP Workstation: [id number]       DISCHARGE EXAMINATION: Vitals:   05/28/24 1939 05/28/24 2357 05/29/24 0510 05/29/24 0716  BP: 111/78 (!) 131/90 112/77 110/76  Pulse: 93 (!) 106 100 97  Resp:    18  Temp: 98 F (36.7 C) 97.9 F (36.6 C) 97.7 F (36.5 C) 98.1 F (36.7 C)  TempSrc:      SpO2: 99% 96% 97% 97%  Weight:       General appearance: Awake alert.  In no distress Resp: Clear to auscultation bilaterally.  Normal effort Cardio: S1-S2 is normal regular.  No S3-S4.  No rubs murmurs or bruit GI: Abdomen is soft.  Nontender nondistended.  Bowel sounds are present normal.  No masses organomegaly Limited range of motion of lower extremity due to pain  DISPOSITION: SNF  Discharge Instructions     Call MD for:  difficulty breathing, headache or visual disturbances   Complete by: As directed    Call MD for:  extreme fatigue   Complete by: As directed    Call MD for:  persistant dizziness or light-headedness   Complete by: As directed    Call MD for:  persistant nausea and vomiting   Complete by: As directed    Call MD for:  severe uncontrolled pain   Complete by: As directed    Call MD for:  temperature >100.4   Complete by: As directed    Diet general   Complete by: As directed    Discharge instructions   Complete by: As directed    Please review instructions on the discharge summary.  You were cared for by a hospitalist during your hospital stay. If you have any questions about your discharge medications or the care you received while you were in  the hospital after you are discharged, you can call the unit and asked to speak with the hospitalist on call if the  hospitalist that took care of you is not available. Once you are discharged, your primary care physician will handle any further medical issues. Please note that NO REFILLS for any discharge medications will be authorized once you are discharged, as it is imperative that you return to your primary care physician (or establish a relationship with a primary care physician if you do not have one) for your aftercare needs so that they can reassess your need for medications and monitor your lab values. If you do not have a primary care physician, you can call [phone] for a physician referral.   Increase activity slowly   Complete by: As directed          Allergies as of 05/29/2024   No Known Allergies      Medication List     STOP taking these medications    Invega Sustenna injection Generic drug: Paliperidone ER   risperiDONE  1 MG tablet Commonly known as: RISPERDAL        TAKE these medications    acetaminophen  325 MG tablet Commonly known as: TYLENOL  Take 2 tablets (650 mg total) by mouth 3 (three) times daily. What changed:  when to take this reasons to take this   Fluoridex Sensitivity Relief 1.1-5 % Gel Generic drug: Sod Fluoride-Potassium Nitrate Place 1 Application onto teeth daily.   hydrocortisone  5 MG tablet Commonly known as: CORTEF  Take 1.5 tablets (7.5 mg total) by mouth daily.   ibuprofen  600 MG tablet Commonly known as: ADVIL  Take 1 tablet (600 mg total) by mouth every 6 (six) hours as needed for moderate pain (pain score 4-6).   lubiprostone  24 MCG capsule Commonly known as: AMITIZA  Take 24 mcg by mouth daily.   ondansetron  4 MG tablet Commonly known as: ZOFRAN  Take 4 mg by mouth every 8 (eight) hours as needed for nausea or vomiting.   pantoprazole  40 MG tablet Commonly known as: PROTONIX  Take 1 tablet (40 mg total) by mouth daily.   rosuvastatin  40 MG tablet Commonly known as: CRESTOR  Take 40 mg by mouth daily.   sennosides-docusate  sodium 8.6-50 MG tablet Commonly known as: SENOKOT-S Take 1 tablet by mouth in the morning and at bedtime.   sodium chloride  1 g tablet Take 1 g by mouth daily.   traMADol  50 MG tablet Commonly known as: ULTRAM  Take 1 tablet (50 mg total) by mouth every 6 (six) hours as needed for severe pain (pain score 7-10). What changed:  how much to take when to take this reasons to take this          Follow-up Information     [name] LABOR, MD Follow up.   Specialty: Internal Medicine Contact information: [address] [place] [address] [phone]         Timmy Maude SAUNDERS, MD Follow up.   Specialty: Oncology Contact information: [address] [place] [address] [phone]                 TOTAL DISCHARGE TIME: 35 minutes  Staysha Truby Verdene  [facility] Pager on [email or web address]  05/29/2024, 10:30 AM

## 2024-05-29 NOTE — Progress Notes (Signed)
 Ms. Nicholl is resting this morning.  I tried to wake her up a little bit but she was still quite lethargic.  I think the key test that we were on her was a prealbumin.  Her prealbumin is less than 5.  This is a very ominous prognostic factor in my opinion.  I do not believe that with free albumin is low, that she would benefit from any therapy at all.  It is difficult to prove that this is truly recurrent melanoma.  I suppose that it probably is.  I know it in the past she was treated with immunotherapy and targeted therapy.  I think that if her nutritional state got better, then there might be an opportunity to try to treat her.  I think in my opinion, we should really focus on quality of life and comfort.  I am not sure she really is hurting.  Labs that were done yesterday show sodium 132.  Potassium 3.7.  BUN 12 creatinine 0.48.  Her magnesium  1.6.  Albumin 2.4.  White cell count 3.5.  Hemoglobin 9.5.  Platelet count 109,000.  Again, I would really favor quality of life.  I would probably consider her for Hospice.  Again I just think that the prealbumin is so low that she has very little reserve for any type of toxicity from treatment.  Jeralyn Crease, MD  2 Timothy 4:6-8

## 2024-05-29 NOTE — Plan of Care (Signed)

## 2024-05-29 NOTE — TOC Progression Note (Signed)
 Transition of Care [facility]) - Progression Note    Patient Details  Name: Kaitlyn Duncan MRN: [id number] Date of Birth: June 07, 1971  Transition of Care [facility]) CM/SW Contact  Sherline Clack, [place] Phone Number: 05/29/2024, 1:46 PM  Clinical Narrative:     Patient will DC to: [facility] Anticipated DC date: 05/29/24  Family notified: Shelba Thompson/guardian Transport by: [place]   Per MD patient ready for DC to [facility]. RN to call report prior to discharge [phone], room 130b ). RN, patient, patient's family, and facility notified of DC. Discharge Summary and FL2 sent to facility. DC packet on chart. Ambulance transport requested for patient.   CSW will sign off for now as social work intervention is no longer needed. Please consult us  again if new needs arise.                       Expected Discharge Plan and Services         Expected Discharge Date: 05/29/24                                     Social Drivers of Health (SDOH) Interventions SDOH Screenings   Food Insecurity: Patient Unable To Answer (05/24/2024)  Housing: Unknown (05/24/2024)  Transportation Needs: Patient Unable To Answer (05/24/2024)  Utilities: Patient Unable To Answer (05/24/2024)  Alcohol Screen: Low Risk  (06/27/2021)  Depression (PHQ2-9): Low Risk  (07/21/2022)  Tobacco Use: Low Risk  (12/28/2023)    Readmission Risk Interventions    11/10/2023    2:57 PM 08/04/2022    3:35 PM  Readmission Risk Prevention Plan  Transportation Screening Complete Complete  PCP or Specialist Appt within 3-5 Days Complete Complete  HRI or Home Care Consult Complete Complete  Social Work Consult for Recovery Care Planning/Counseling Complete Complete  Palliative Care Screening Complete Complete  Medication Review Ocean[facility]) Referral to Pharmacy Complete

## 2024-05-29 NOTE — Progress Notes (Signed)
 Physical Therapy Treatment Patient Details Name: Kaitlyn Duncan MRN: [id number] DOB: 03-14-1971 Today's Date: 05/29/2024   History of Present Illness 53 y.o. F adm from [facility] 05/24/24 with lethargy, weakness, poor oral intake, acute metabolic encephalopathy, hypokalemia, and dehydration. Imaging of pelvis negative for fx. MRI brain (-). MRI: T12, L2, S1, R & L acetabular osseous mets. PMHx: anemia, melanoma with mets to brain with whole brain radiation, schizophrenia with noncompliance, adrenal insufficiency, CVA.    PT Comments  Pt supine and able to state first name on arrival. Pt continues to maintain legs flexed and adducted at rest. Pt was able to tolerate LLE knee and hip extension in supine but resistant to abduction and resisted all movement of RLE with pt moaning with movement. Max-total assist for bed level mobility with pt repositioned and pillow between knees. Will continue to follow from a distance for mobility and recommend further needs be addressed at her SNF.    If plan is discharge home, recommend the following: Two people to help with walking and/or transfers;Two people to help with bathing/dressing/bathroom;Assistance with cooking/housework;Assistance with feeding;Direct supervision/assist for medications management;Direct supervision/assist for financial management;Assist for transportation;Help with stairs or ramp for entrance;Supervision due to cognitive status   Can travel by private vehicle     No  Equipment Recommendations  Other (comment) (defer to next venue)    Recommendations for Other Services       Precautions / Restrictions Precautions Precautions: Fall Recall of Precautions/Restrictions: Impaired     Mobility  Bed Mobility Overal bed mobility: Needs Assistance Bed Mobility: Rolling, Sidelying to Sit, Sit to Supine Rolling: Max assist, Used rails Sidelying to sit: Total assist   Sit to supine: Total assist   General bed mobility  comments: pt grasped rail with Rt hand to roll, total assist to pivot to EOB and back to bed. EOB pt with flexed trunk and right lean unable to support trunk in sitting without physical assist. Total assist to slide toward HOB. Pt positioned with pillow under left hip and between knees    Transfers                   General transfer comment: did not attempt as pt with poor balance, total assist and maintaining Rt hip and knee flexion    Ambulation/Gait                   Stairs             Wheelchair Mobility     Tilt Bed    Modified Rankin (Stroke Patients Only)       Balance Overall balance assessment: Needs assistance Sitting-balance support: No upper extremity supported, Feet unsupported Sitting balance-Leahy Scale: Poor                                      Communication Communication Communication: Impaired Factors Affecting Communication: Reduced clarity of speech  Cognition Arousal: Alert Behavior During Therapy: Flat affect   PT - Cognitive impairments: History of cognitive impairments, No family/caregiver present to determine baseline                       PT - Cognition Comments: pt able to state first name but providing different last name. Would not respond to further questions for orientation or pain location. Not following commands for mobility Following commands: Impaired Following  commands impaired: Follows one step commands inconsistently, Follows one step commands with increased time    Cueing Cueing Techniques: Verbal cues, Tactile cues, Gestural cues  Exercises      General Comments        Pertinent Vitals/Pain Pain Assessment Pain Assessment: CPOT Facial Expression: Relaxed, neutral Body Movements: Protection Muscle Tension: Tense, rigid Compliance with ventilator (intubated pts.): N/A Vocalization (extubated pts.): Sighing, moaning CPOT Total: 3 Pain Location: RLE with any hip  movement Pain Intervention(s): Limited activity within patient's tolerance, Monitored during session, Repositioned    Home Living                          Prior Function            PT Goals (current goals can now be found in the care plan section) Progress towards PT goals: Not progressing toward goals - comment    Frequency    Min 1X/week      PT Plan      Co-evaluation              AM-PAC PT 6 Clicks Mobility   Outcome Measure  Help needed turning from your back to your side while in a flat bed without using bedrails?: Total Help needed moving from lying on your back to sitting on the side of a flat bed without using bedrails?: Total Help needed moving to and from a bed to a chair (including a wheelchair)?: Total Help needed standing up from a chair using your arms (e.g., wheelchair or bedside chair)?: Total Help needed to walk in hospital room?: Total Help needed climbing 3-5 steps with a railing? : Total 6 Click Score: 6    End of Session   Activity Tolerance: Patient limited by pain Patient left: in bed;with call bell/phone within reach;with bed alarm set Nurse Communication: Mobility status;Need for lift equipment PT Visit Diagnosis: Muscle weakness (generalized) (M62.81);Difficulty in walking, not elsewhere classified (R26.2);Pain     Time: 0750-0807 PT Time Calculation (min) (ACUTE ONLY): 17 min  Charges:    $Therapeutic Activity: 8-22 mins PT General Charges $$ ACUTE PT VISIT: 1 Visit                     Lenoard SQUIBB, PT Acute Rehabilitation Services Office: [phone]    Lenoard NOVAK Wylie Russon 05/29/2024, 10:43 AM

## 2024-05-29 NOTE — Progress Notes (Signed)
 While attempting to administer morning medications, patient became irritable this RN referred to her as honey. Patient began repeating no, I hate you to this RN. When inquired as to why, patient stated I'm no ones honey. This RN apologetic towards patient, patient requested to be referred to as [facility]. Patient refusing to take medication from this RN, but was agreeable to nursing assessment as documented.
